# Patient Record
Sex: Female | Born: 1935
Health system: Southern US, Community
[De-identification: ages and names within clinical notes are randomized; demographics above are authoritative.]

## PROBLEM LIST (undated history)

## (undated) ENCOUNTER — Emergency Department (HOSPITAL_COMMUNITY): Admission: EM | Payer: Medicare Other | Source: Home / Self Care

## (undated) DIAGNOSIS — F17200 Nicotine dependence, unspecified, uncomplicated: Secondary | ICD-10-CM

## (undated) DIAGNOSIS — E785 Hyperlipidemia, unspecified: Secondary | ICD-10-CM

## (undated) DIAGNOSIS — I219 Acute myocardial infarction, unspecified: Secondary | ICD-10-CM

## (undated) DIAGNOSIS — I4819 Other persistent atrial fibrillation: Secondary | ICD-10-CM

## (undated) DIAGNOSIS — D649 Anemia, unspecified: Secondary | ICD-10-CM

## (undated) DIAGNOSIS — I2510 Atherosclerotic heart disease of native coronary artery without angina pectoris: Secondary | ICD-10-CM

## (undated) DIAGNOSIS — H409 Unspecified glaucoma: Secondary | ICD-10-CM

## (undated) DIAGNOSIS — I10 Essential (primary) hypertension: Secondary | ICD-10-CM

## (undated) DIAGNOSIS — J309 Allergic rhinitis, unspecified: Secondary | ICD-10-CM

## (undated) DIAGNOSIS — J449 Chronic obstructive pulmonary disease, unspecified: Secondary | ICD-10-CM

## (undated) DIAGNOSIS — K262 Acute duodenal ulcer with both hemorrhage and perforation: Secondary | ICD-10-CM

## (undated) DIAGNOSIS — I509 Heart failure, unspecified: Secondary | ICD-10-CM

## (undated) DIAGNOSIS — J4489 Other specified chronic obstructive pulmonary disease: Secondary | ICD-10-CM

## (undated) HISTORY — DX: Nicotine dependence, unspecified, uncomplicated: F17.200

## (undated) HISTORY — DX: Allergic rhinitis, unspecified: J30.9

## (undated) HISTORY — PX: OTHER SURGICAL HISTORY: SHX169

## (undated) HISTORY — DX: Acute myocardial infarction, unspecified: I21.9

## (undated) HISTORY — DX: Essential (primary) hypertension: I10

## (undated) HISTORY — DX: Other specified chronic obstructive pulmonary disease: J44.89

## (undated) HISTORY — DX: Anemia, unspecified: D64.9

## (undated) HISTORY — DX: Hyperlipidemia, unspecified: E78.5

## (undated) HISTORY — DX: Atherosclerotic heart disease of native coronary artery without angina pectoris: I25.10

## (undated) HISTORY — DX: Heart failure, unspecified: I50.9

## (undated) HISTORY — DX: Other persistent atrial fibrillation: I48.19

## (undated) HISTORY — DX: Chronic obstructive pulmonary disease, unspecified: J44.9

## (undated) HISTORY — DX: Unspecified glaucoma: H40.9

## (undated) HISTORY — DX: Acute duodenal ulcer with both hemorrhage and perforation: K26.2

---

## 1988-12-09 HISTORY — PX: HEMORRHOID SURGERY: SHX153

## 2009-06-08 DIAGNOSIS — I2510 Atherosclerotic heart disease of native coronary artery without angina pectoris: Secondary | ICD-10-CM

## 2009-06-08 DIAGNOSIS — I219 Acute myocardial infarction, unspecified: Secondary | ICD-10-CM

## 2009-06-08 HISTORY — DX: Atherosclerotic heart disease of native coronary artery without angina pectoris: I25.10

## 2009-06-08 HISTORY — DX: Acute myocardial infarction, unspecified: I21.9

## 2009-07-07 ENCOUNTER — Ambulatory Visit: Payer: Self-pay | Admitting: Cardiovascular Disease

## 2009-07-07 ENCOUNTER — Inpatient Hospital Stay (HOSPITAL_COMMUNITY): Admission: EM | Admit: 2009-07-07 | Discharge: 2009-07-11 | Payer: Self-pay | Admitting: Emergency Medicine

## 2009-07-10 ENCOUNTER — Ambulatory Visit: Payer: Self-pay | Admitting: Surgery

## 2009-07-10 ENCOUNTER — Encounter: Payer: Self-pay | Admitting: Cardiology

## 2009-07-12 DIAGNOSIS — E785 Hyperlipidemia, unspecified: Secondary | ICD-10-CM | POA: Insufficient documentation

## 2009-07-12 DIAGNOSIS — I219 Acute myocardial infarction, unspecified: Secondary | ICD-10-CM | POA: Insufficient documentation

## 2009-07-12 DIAGNOSIS — F17200 Nicotine dependence, unspecified, uncomplicated: Secondary | ICD-10-CM

## 2009-07-12 DIAGNOSIS — E119 Type 2 diabetes mellitus without complications: Secondary | ICD-10-CM | POA: Insufficient documentation

## 2009-07-19 ENCOUNTER — Ambulatory Visit: Payer: Self-pay | Admitting: Cardiology

## 2009-07-19 ENCOUNTER — Encounter: Payer: Self-pay | Admitting: Physician Assistant

## 2009-07-19 ENCOUNTER — Encounter: Payer: Self-pay | Admitting: Cardiology

## 2009-07-19 ENCOUNTER — Ambulatory Visit: Payer: Self-pay

## 2009-07-19 DIAGNOSIS — R1909 Other intra-abdominal and pelvic swelling, mass and lump: Secondary | ICD-10-CM

## 2009-07-27 ENCOUNTER — Encounter: Payer: Self-pay | Admitting: Cardiology

## 2009-08-08 ENCOUNTER — Ambulatory Visit: Payer: Self-pay | Admitting: Cardiology

## 2009-08-11 ENCOUNTER — Encounter: Payer: Self-pay | Admitting: Cardiology

## 2009-08-16 ENCOUNTER — Encounter: Payer: Self-pay | Admitting: Cardiology

## 2009-08-16 ENCOUNTER — Ambulatory Visit: Payer: Self-pay

## 2009-10-03 ENCOUNTER — Ambulatory Visit: Payer: Self-pay | Admitting: Cardiology

## 2009-10-03 DIAGNOSIS — I2510 Atherosclerotic heart disease of native coronary artery without angina pectoris: Secondary | ICD-10-CM | POA: Insufficient documentation

## 2009-10-04 LAB — CONVERTED CEMR LAB
Albumin: 3.8 g/dL (ref 3.5–5.2)
Basophils Absolute: 0.1 10*3/uL (ref 0.0–0.1)
Bilirubin, Direct: 0 mg/dL (ref 0.0–0.3)
CO2: 29 meq/L (ref 19–32)
Calcium: 8.6 mg/dL (ref 8.4–10.5)
Creatinine, Ser: 0.7 mg/dL (ref 0.4–1.2)
HCT: 47.3 % — ABNORMAL HIGH (ref 36.0–46.0)
HDL: 47.5 mg/dL (ref >39.00)
Hemoglobin: 16.4 g/dL — ABNORMAL HIGH (ref 12.0–15.0)
Lymphocytes Relative: 18.9 % (ref 12.0–46.0)
MCHC: 34.7 g/dL (ref 30.0–36.0)
MCV: 95.2 fL (ref 78.0–100.0)
Neutrophils Relative %: 71 % (ref 43.0–77.0)
Potassium: 4 meq/L (ref 3.5–5.1)
RBC: 4.97 M/uL (ref 3.87–5.11)
RDW: 12.8 % (ref 11.5–14.6)
Sodium: 138 meq/L (ref 135–145)
Total CHOL/HDL Ratio: 4

## 2009-10-05 ENCOUNTER — Inpatient Hospital Stay (HOSPITAL_COMMUNITY): Admission: EM | Admit: 2009-10-05 | Discharge: 2009-10-09 | Payer: Self-pay | Admitting: Emergency Medicine

## 2009-10-05 ENCOUNTER — Encounter (INDEPENDENT_AMBULATORY_CARE_PROVIDER_SITE_OTHER): Payer: Self-pay | Admitting: *Deleted

## 2009-10-05 ENCOUNTER — Ambulatory Visit: Payer: Self-pay | Admitting: Cardiology

## 2009-10-05 DIAGNOSIS — K262 Acute duodenal ulcer with both hemorrhage and perforation: Secondary | ICD-10-CM

## 2009-10-05 HISTORY — DX: Acute duodenal ulcer with both hemorrhage and perforation: K26.2

## 2009-10-06 ENCOUNTER — Encounter: Payer: Self-pay | Admitting: Gastroenterology

## 2009-10-09 LAB — CONVERTED CEMR LAB
AST: 20 units/L
BUN: 7 mg/dL
CO2: 29 meq/L
Chloride: 109 meq/L
Hemoglobin: 8.1 g/dL
Platelets: 205 10*3/uL
Potassium: 3.5 meq/L
Sodium: 142 meq/L
Total Bilirubin: 0.4 mg/dL
Total Protein: 5 g/dL

## 2009-10-11 ENCOUNTER — Ambulatory Visit: Payer: Self-pay | Admitting: Internal Medicine

## 2009-10-11 DIAGNOSIS — D649 Anemia, unspecified: Secondary | ICD-10-CM

## 2009-10-11 DIAGNOSIS — H409 Unspecified glaucoma: Secondary | ICD-10-CM | POA: Insufficient documentation

## 2009-10-11 LAB — CONVERTED CEMR LAB
Basophils Relative: 0.9 % (ref 0.0–3.0)
Eosinophils Relative: 2.4 % (ref 0.0–5.0)
HCT: 28.1 % — ABNORMAL LOW (ref 36.0–46.0)
Hemoglobin: 9.6 g/dL — ABNORMAL LOW (ref 12.0–15.0)
MCHC: 34.1 g/dL (ref 30.0–36.0)
MCV: 97.3 fL (ref 78.0–100.0)
Monocytes Absolute: 0.6 10*3/uL (ref 0.1–1.0)
Neutro Abs: 4.7 10*3/uL (ref 1.4–7.7)
RBC: 2.89 M/uL — ABNORMAL LOW (ref 3.87–5.11)
WBC: 7.6 10*3/uL (ref 4.5–10.5)

## 2009-11-09 ENCOUNTER — Telehealth: Payer: Self-pay | Admitting: Internal Medicine

## 2010-01-11 ENCOUNTER — Encounter: Payer: Self-pay | Admitting: Cardiology

## 2010-01-12 ENCOUNTER — Encounter (INDEPENDENT_AMBULATORY_CARE_PROVIDER_SITE_OTHER): Payer: Self-pay | Admitting: *Deleted

## 2010-01-12 ENCOUNTER — Ambulatory Visit: Payer: Self-pay | Admitting: Internal Medicine

## 2010-01-12 ENCOUNTER — Telehealth (INDEPENDENT_AMBULATORY_CARE_PROVIDER_SITE_OTHER): Payer: Self-pay | Admitting: *Deleted

## 2010-01-12 LAB — CONVERTED CEMR LAB
Basophils Absolute: 0.1 10*3/uL (ref 0.0–0.1)
Basophils Relative: 1.4 % (ref 0.0–3.0)
Eosinophils Relative: 2.2 % (ref 0.0–5.0)
HCT: 35.8 % — ABNORMAL LOW (ref 36.0–46.0)
Hemoglobin: 11.5 g/dL — ABNORMAL LOW (ref 12.0–15.0)
Lymphs Abs: 1.3 10*3/uL (ref 0.7–4.0)
MCV: 77.6 fL — ABNORMAL LOW (ref 78.0–100.0)
Monocytes Absolute: 0.4 10*3/uL (ref 0.1–1.0)

## 2010-02-05 ENCOUNTER — Ambulatory Visit: Payer: Self-pay | Admitting: Gastroenterology

## 2010-02-05 DIAGNOSIS — R1013 Epigastric pain: Secondary | ICD-10-CM | POA: Insufficient documentation

## 2010-02-05 DIAGNOSIS — R1310 Dysphagia, unspecified: Secondary | ICD-10-CM | POA: Insufficient documentation

## 2010-02-07 ENCOUNTER — Telehealth: Payer: Self-pay | Admitting: Cardiology

## 2010-02-08 ENCOUNTER — Telehealth: Payer: Self-pay | Admitting: Gastroenterology

## 2010-03-09 ENCOUNTER — Ambulatory Visit: Payer: Self-pay | Admitting: Cardiology

## 2010-03-09 DIAGNOSIS — R609 Edema, unspecified: Secondary | ICD-10-CM

## 2010-03-09 LAB — CONVERTED CEMR LAB
Basophils Absolute: 0.1 10*3/uL (ref 0.0–0.1)
Eosinophils Relative: 2 % (ref 0–5)
Lymphs Abs: 1.4 10*3/uL (ref 0.7–4.0)
MCV: 77.6 fL — ABNORMAL LOW (ref 78.0–100.0)
Monocytes Absolute: 0.6 10*3/uL (ref 0.1–1.0)
Monocytes Relative: 9 % (ref 3–12)
Neutro Abs: 4.4 10*3/uL (ref 1.7–7.7)
Neutrophils Relative %: 67 % (ref 43–77)
Platelets: 367 10*3/uL (ref 150–400)
RDW: 18.4 % — ABNORMAL HIGH (ref 11.5–15.5)
WBC: 6.6 10*3/uL (ref 4.0–10.5)

## 2010-03-26 ENCOUNTER — Telehealth (INDEPENDENT_AMBULATORY_CARE_PROVIDER_SITE_OTHER): Payer: Self-pay | Admitting: *Deleted

## 2010-03-27 ENCOUNTER — Ambulatory Visit: Payer: Self-pay

## 2010-03-27 ENCOUNTER — Encounter (HOSPITAL_COMMUNITY): Admission: RE | Admit: 2010-03-27 | Discharge: 2010-06-08 | Payer: Self-pay | Admitting: Cardiology

## 2010-03-27 ENCOUNTER — Ambulatory Visit: Payer: Self-pay | Admitting: Cardiovascular Disease

## 2010-03-27 ENCOUNTER — Encounter: Payer: Self-pay | Admitting: Cardiology

## 2010-04-03 ENCOUNTER — Ambulatory Visit: Payer: Self-pay | Admitting: Internal Medicine

## 2010-04-04 ENCOUNTER — Encounter: Payer: Self-pay | Admitting: Internal Medicine

## 2010-04-06 ENCOUNTER — Ambulatory Visit: Payer: Self-pay | Admitting: Cardiology

## 2010-04-06 DIAGNOSIS — I10 Essential (primary) hypertension: Secondary | ICD-10-CM | POA: Insufficient documentation

## 2010-05-21 ENCOUNTER — Ambulatory Visit: Payer: Self-pay | Admitting: Internal Medicine

## 2010-05-21 DIAGNOSIS — R635 Abnormal weight gain: Secondary | ICD-10-CM | POA: Insufficient documentation

## 2010-05-21 DIAGNOSIS — J449 Chronic obstructive pulmonary disease, unspecified: Secondary | ICD-10-CM | POA: Insufficient documentation

## 2010-05-21 LAB — CONVERTED CEMR LAB
CO2: 27 meq/L (ref 19–32)
Chloride: 105 meq/L (ref 96–112)
Creatinine, Ser: 0.7 mg/dL (ref 0.4–1.2)
GFR calc non Af Amer: 92.87 mL/min (ref >60)
Potassium: 4.5 meq/L (ref 3.5–5.1)
Sodium: 142 meq/L (ref 135–145)

## 2010-07-24 ENCOUNTER — Encounter: Admission: RE | Admit: 2010-07-24 | Discharge: 2010-09-06 | Payer: Self-pay | Admitting: Internal Medicine

## 2010-07-24 ENCOUNTER — Encounter: Payer: Self-pay | Admitting: Internal Medicine

## 2010-07-25 ENCOUNTER — Telehealth: Payer: Self-pay | Admitting: Internal Medicine

## 2010-09-13 ENCOUNTER — Ambulatory Visit: Payer: Self-pay | Admitting: Cardiology

## 2010-09-19 LAB — CONVERTED CEMR LAB
AST: 21 units/L (ref 0–37)
Alkaline Phosphatase: 88 units/L (ref 39–117)
Basophils Relative: 1.3 % (ref 0.0–3.0)
Bilirubin, Direct: 0 mg/dL (ref 0.0–0.3)
Cholesterol: 259 mg/dL — ABNORMAL HIGH (ref 0–200)
HCT: 38.7 % (ref 36.0–46.0)
HDL: 47.8 mg/dL (ref >39.00)
Hemoglobin: 13 g/dL (ref 12.0–15.0)
Lymphocytes Relative: 18.5 % (ref 12.0–46.0)
MCV: 80.9 fL (ref 78.0–100.0)
Neutrophils Relative %: 70.9 % (ref 43.0–77.0)
Platelets: 273 10*3/uL (ref 150.0–400.0)
Total Bilirubin: 0.3 mg/dL (ref 0.3–1.2)

## 2010-10-16 ENCOUNTER — Ambulatory Visit: Payer: Self-pay | Admitting: Internal Medicine

## 2010-10-16 DIAGNOSIS — J309 Allergic rhinitis, unspecified: Secondary | ICD-10-CM | POA: Insufficient documentation

## 2011-01-08 NOTE — Miscellaneous (Signed)
Summary: [facility]  [facility]   Imported By: Roderic Ovens 01/23/2010 15:18:34  _____________________________________________________________________  External Attachment:    Type:   Image     Comment:   External Document

## 2011-01-08 NOTE — Assessment & Plan Note (Signed)
Summary: Cardiology Nuclear Study  Nuclear Med Background Indications for Stress Test: Evaluation for Ischemia, Stent Patency   History: Asthma, COPD, Heart Catheterization, Myocardial Infarction, Myocardial Perfusion Study, Stents  History Comments: 7/10 AWMI>Stent-RCA  Symptoms: Chest Pain, Diaphoresis, DOE, Fatigue, Rapid HR  Symptoms Comments: Last episode of CP:December 2010   Nuclear Pre-Procedure Cardiac Risk Factors: Hypertension, Lipids, Obesity, Smoker Caffeine/Decaff Intake: none NPO After: 8:00 PM Lungs: Inspiratory wheeze right lower lung; albuterol inhaler used prior to lexiscan.  O2 Sat 97% on RA. IV 0.9% NS with Angio Cath: 20g     IV Site: (R) wrist IV Started by: Stanton Kidney EMT-P Chest Size (in) 42     Cup Size D     Height (in): 62 Weight (lb): 200 BMI: 36.71 Tech Comments: metoprolol held x 24 hours, per patient.  Nuclear Med Study 1 or 2 day study:  1 day     Stress Test Type:  Eugenie Birks Reading MD:  Charlton Haws, MD     Referring MD:  Charlies Constable, MD Resting Radionuclide:  Technetium 47m Tetrofosmin     Resting Radionuclide Dose:  11 mCi  Stress Radionuclide:  Technetium 59m Tetrofosmin     Stress Radionuclide Dose:  33 mCi   Stress Protocol   Lexiscan: 0.4 mg   Stress Test Technologist:  Rea College CMA-N     Nuclear Technologist:  Domenic Polite CNMT  Rest Procedure  Myocardial perfusion imaging was performed at rest 45 minutes following the intravenous administration of Myoview Technetium 64m Tetrofosmin.  Stress Procedure  The patient received IV Lexiscan 0.4 mg over 15-seconds.  Myoview injected at 30-seconds.  There were no significant changes with lexiscan, rare PVC.  Quantitative spect images were obtained after a 45 minute delay.  QPS Raw Data Images:  Normal; no motion artifact; normal heart/lung ratio. Stress Images:  NI: Uniform and normal uptake of tracer in all myocardial segments. Rest Images:  Normal homogeneous uptake in all  areas of the myocardium. Subtraction (SDS):  Normal Transient Ischemic Dilatation:  1.11  (Normal <1.22)  Lung/Heart Ratio:  .34  (Normal <0.45)  Quantitative Gated Spect Images QGS EDV:  62 ml QGS ESV:  14 ml QGS EF:  78 % QGS cine images:  normal  Findings Normal nuclear study      Overall Impression  Exercise Capacity: Lexiscan BP Response: Normal blood pressure response. Clinical Symptoms: Dyspnea and Nausea ECG Impression: No significant ST segment change suggestive of ischemia. Overall Impression: Normal stress nuclear study. Overall Impression Comments: Breast attenuation  Appended Document: Cardiology Nuclear Study The pt has an appointment 4/29 with Dr. Juanda Chance.  Appended Document: Cardiology Nuclear Study The pt was made aware of her results at her office visit today.

## 2011-01-08 NOTE — Letter (Signed)
Summary: New Patient letter  [facility]  [address] [place], [place] [address]   Phone: [phone]  Fax: [phone]       01/12/2010 MRN: [id number]  Jenny [name] [address] [place], [place]  [address]  [place]  Dear Ms. Yetta Barre,  Welcome to the Gastroenterology Division at [facility].    You are scheduled to see Dr.  Arlyce Dice on 02-05-10 at 2pm on the 3rd floor at [facility], [address]  We ask that you try to arrive at our office 15 minutes prior to your appointment time to allow for check-in.  We would like you to complete the enclosed self-administered evaluation form prior to your visit and bring it with you on the day of your appointment.  We will review it with you.  Also, please bring a complete list of all your medications or, if you prefer, bring the medication bottles and we will list them.  Please bring your insurance card so that we may make a copy of it.  If your insurance requires a referral to see a specialist, please bring your referral form from your primary care physician.  Co-payments are due at the time of your visit and may be paid by cash, check or credit card.     Your office visit will consist of a consult with your physician (includes a physical exam), any laboratory testing he/she may order, scheduling of any necessary diagnostic testing (e.g. x-ray, ultrasound, CT-scan), and scheduling of a procedure (e.g. Endoscopy, Colonoscopy) if required.  Please allow enough time on your schedule to allow for any/all of these possibilities.    If you cannot keep your appointment, please call [phone] to cancel or reschedule prior to your appointment date.  This allows [place] the opportunity to schedule an appointment for another patient in need of care.  If you do not cancel or reschedule by 5 p.m. the business day prior to your appointment date, you will be charged a $50.00 late cancellation/no-show fee.    Thank you for choosing [facility] for your medical needs.  We appreciate the opportunity to care for you.  Please visit [place] at our website  to learn more about our practice.                     Sincerely,                                                             The Gastroenterology Division

## 2011-01-08 NOTE — Progress Notes (Signed)
Summary: Speak to Robin  Phone Note Call from Patient Call back at Home Phone [phone] Call back at OR CELL [phone]   Call For: DR [facility] Reason for Call: Talk to Nurse Summary of Call: Returned pts call she stated Dr Juanda Chance does not want her off the Plavix until after July. She will call back and schedule at that time. Pt needs EGD with Dilation and wants a colonoscopy done at the same time but will call back to schedule Initial call taken by: Leanor Kail [facility],  February 08, 2010 11:49 AM  Follow-up for Phone Call        Dr Leota Jacobsen  Additional Follow-up for Phone Call Additional follow up Details #1::        ok Additional Follow-up by: Louis Meckel MD,  February 09, 2010 10:14 AM

## 2011-01-08 NOTE — Assessment & Plan Note (Signed)
Summary: 1 MONTH ROV   Visit Type:  Follow-up Primary Provider:  Newt Lukes MD  CC:  sob .  History of Present Illness: the patient is 77 years old and return for management of CAD. She had an anterior MI in July of 2010 treated with a drug-eluting stent to the right coronary. This was complicated by a false aneurysm. In October she was admitted with a GI bleed and was found to have an active duodenal ulcer on endoscopy.  She recovered from all this but recently she says she has had increasing difficulty with shortness of breath with exertion. She does walk across the room and she gets short of breath. She does have known COPD and is a smoker. She's cut back from the carton a week to a carton every 3-4 weeks.  Her other problems include hypertension, hyperlipidemia, and diabetes.  Per recent PFT's she has moderate COPD which is reversible to bronchodilators. she was also evaluated for her SOB with a myoview which was negative.   Patient currently denies SOB, Denies CP, Denies fever, chills, nausea, vomiting, diarrhea, constipation and otherwise doing well and denies any other complaints.     Current Medications (verified): 1)  Plavix 75 Mg Tabs (Clopidogrel Bisulfate) .... Take One Daily 2)  Simvastatin 40 Mg Tabs (Simvastatin) .... Take One Daily 3)  Metoprolol Succinate 50 Mg Xr24h-Tab (Metoprolol Succinate) .... Take One Daily 4)  Nitroglycerin 0.4 Mg Subl (Nitroglycerin) .... Take As Needed 5)  Potassium 500 Mg .... Take As Needed 6)  Lutein 20 Mg Caps (Lutein) .... One Tab Once Daily 7)  Feosol 200 (65 Fe) Mg Tabs (Ferrous Sulfate Dried) .... One Tab As Needed 8)  Bilberry 500 Mg Caps (Bilberry (Vaccinium Myrtillus)) .... Patient Takes 1000mg  Daily 9)  Chromium Picolinate Ultra 500 Mcg Tabs (Chromium Picolinate) .... One Tab Q2weeks 10)  Tylenol Extra Strength 500 Mg Tabs (Acetaminophen) .... As Needed 11)  Xalatan 0.005 % Soln (Latanoprost) .... One Gtt Both Eyes Hs 12)   Omeprazole 20 Mg Cpdr (Omeprazole) .... Take 1 Two Times A Day 13)  Milk of Magnesia 7.75 % Susp (Magnesium Hydroxide) .... Take Once Daily 14)  Vitamin B-6 100 Mg Tabs (Pyridoxine Hcl) .... Take 4 To 5 Times Weekly 15)  Zyrtec Hives Relief 10 Mg Tabs (Cetirizine Hcl) .... For Allergies 1 Tab Once Daily 16)  Advair Diskus 250-50 Mcg/dose Aepb (Fluticasone-Salmeterol) .... One Puff Two Times A Day 17)  Ventolin Hfa 108 (90 Base) Mcg/act Aers (Albuterol Sulfate) .... Use One Puff Every 4-6 Hours As Needed For Shortness of Breath.  Allergies (verified): 1)  ! Demerol 2)  ! 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 [facility] - prev hung, now to be LeB Glaucoma Upper GI Bleed 10/2009 Anal Fissure Arthritis Hypertension Obesity Pneumonia  Review of Systems       Negative except per HPI  Vital Signs:  Patient profile:   77 year old female Height:      62 inches Weight:      204 pounds Pulse rate:   77 / minute BP sitting:   160 / 75  (left arm) Cuff size:   large  Vitals Entered By: Burnett Kanaris, CNA (April 06, 2010 3:56 PM)  Physical Exam  General:  alert,  well-developed, well-nourished, and cooperative to examination.    Lungs:  decreased BS bilateral.  prolonged expiratory phase, no wheezing. decreased BS bilateral.   Heart:  normal rate, regular rhythm, no murmur, and no rub.  Abdomen:  soft, non-tender, normal bowel sounds, no distention; no masses and no appreciable hepatomegaly or splenomegaly.   Msk:  No deformity or scoliosis noted of thoracic or lumbar spine.   Extremities:  No cyanosis, clubbing, edema  Neurologic:  alert & oriented X3 and cranial nerves II-XII symetrically intact.  strength normal in all extremities, sensation intact to light touch, and  gait normal. speech fluent without dysarthria or aphasia; follows commands with good comprehension.    Impression & Recommendations:  Problem # 1:  SHORTNESS OF BREATH (ICD-786.05) Assessment Comment Only She has shortness of breath with minimal exertion which has become somewhat disabling. She has no associated chest pain. Since she had a negative myoview and moderate COPD reversible with albuterol, will start patient with Advair and albuterol for her COPD. Will see her again in 6 months.   Her updated medication list for this problem includes:    Metoprolol Succinate 50 Mg Xr24h-tab (Metoprolol succinate) .Marland Kitchen... Take one daily  Problem # 2:  CAD, NATIVE VESSEL (ICD-414.01) Assessment: Comment Only Well controlled on current treatment, No new changes made today, Will continue to monitor.   Her updated medication list for this problem includes:    Plavix 75 Mg Tabs (Clopidogrel bisulfate) .Marland Kitchen... Take one daily    Metoprolol Succinate 50 Mg Xr24h-tab (Metoprolol succinate) .Marland Kitchen... Take one daily    Nitroglycerin 0.4 Mg Subl (Nitroglycerin) .Marland Kitchen... Take as needed  Her updated medication list for this problem includes:    Plavix 75 Mg Tabs (Clopidogrel bisulfate) .Marland Kitchen... Take one daily    Metoprolol Succinate 50 Mg Xr24h-tab (Metoprolol succinate) .Marland Kitchen... Take one daily    Nitroglycerin 0.4 Mg Subl (Nitroglycerin) .Marland Kitchen... Take as needed  Problem # 3:  HYPERLIPIDEMIA (ICD-272.4) Assessment: Comment Only Well controlled on current treatment, No new changes made today, Will continue to monitor.   Her updated medication list for this problem includes:    Simvastatin 40 Mg Tabs (Simvastatin) .Marland Kitchen... Take one daily  CHOL: 198 (10/03/2009)   HDL: 47.50 (10/03/2009)   TG: 272.0 (10/03/2009)  Problem # 4:  TOBACCO ABUSE (ICD-305.1) Assessment: Comment Only Patient was counseled on smoking cessation strategies including medications and behavior modification options. Patient said she was not ready to stop  smoking at this time.  Time spent talking to patient about smoking 10 minutes.   Problem # 5:  HYPERTENSION, BENIGN (ICD-401.1) Assessment: Comment Only BP slightly elevated today, however this is 2/2 to patient not taking her BB today, will recheck on next visit, for now no new med changes made.   Her updated medication list for this problem includes:    Metoprolol Succinate 50 Mg Xr24h-tab (Metoprolol succinate) .Marland Kitchen... Take one daily  BP today: 160/75 Prior BP: 142/76 (03/09/2010)  Labs Reviewed: K+: 3.5 (10/09/2009) Creat: : 0.75 (10/09/2009)   Chol: 198 (10/03/2009)   HDL: 47.50 (10/03/2009)   TG: 272.0 (10/03/2009)  Patient Instructions: 1)  Your physician has recommended you make the following change in your medication: 1) Start Advair 250/34mcg one puff two times a day , 2) Start Albuterol (ventolin) one puff every 4-6 hours as needed for shortness of breath. 2)  Your physician wants you to follow-up in:  6 months. You will receive a reminder letter in the mail two months in advance. If  you don't receive a letter, please call our office to schedule the follow-up appointment. Prescriptions: VENTOLIN HFA 108 (90 BASE) MCG/ACT AERS (ALBUTEROL SULFATE) use one puff every 4-6 hours as needed for shortness of breath.  #1 x 3   Entered by:   Sherri Rad, RN, BSN   Authorized by:   Lenoria Farrier, MD, [facility]   Signed by:   Sherri Rad, RN, BSN on 04/06/2010   Method used:   Electronically to        [facility] (retail)       [address]       [place], [place]  [id number]       Ph: [phone]       Fax: [phone]   RxID:   [id number] ADVAIR DISKUS 250-50 MCG/DOSE AEPB (FLUTICASONE-SALMETEROL) one puff two times a day  #1 x 6   Entered by:   Sherri Rad, RN, BSN   Authorized by:   Lenoria Farrier, MD, [facility]   Signed by:   Sherri Rad, RN, BSN on 04/06/2010   Method used:   Electronically to        [facility] (retail)       [address]       [place], [place]  [id number]       Ph: [phone]       Fax: [phone]   RxID:   [id number]

## 2011-01-08 NOTE — Assessment & Plan Note (Signed)
Summary: DU WITH GIB/YF   History of Present Illness Visit Type: Initial Consult Primary GI MD: Melvia Heaps MD [facility] Primary Provider: Newt Lukes MD Requesting Provider: Rene Paci, MD Chief Complaint: abdominal pain History of Present Illness:   Mr. Icard is a 77 year old white female referred at the request of Dr.Leschber for evaluation of a abdominall pain.  In October, 2010 she was hospitalized with an acute bleeding duodenal ulcer.  This was treated endoscopically by clipping.  At the time she was on full dose aspirin.  She remains on omeprazole 20 mg b.i.d.  For several weeks she has been complaining of upper midepigastric discomfort, especially with eating.  She may have some dysphagia to solidsalong with  nausea and complains of belching, bloating and some acid reflux.  Symptoms are mostly postprandial.  The patient has a history of an MI.  She also has glaucoma.  Medical problems are stable.   GI Review of Systems    Reports abdominal pain, acid reflux, belching, bloating, chest pain, nausea, and  vomiting.     Location of  Abdominal pain: upper abdomen.    Denies dysphagia with liquids, dysphagia with solids, heartburn, loss of appetite, vomiting blood, weight loss, and  weight gain.      Reports constipation, hemorrhoids, and  rectal bleeding.     Denies anal fissure, black tarry stools, change in bowel habit, diarrhea, diverticulosis, fecal incontinence, heme positive stool, irritable bowel syndrome, jaundice, light color stool, liver problems, and  rectal pain.    Current Medications (verified): 1)  Plavix 75 Mg Tabs (Clopidogrel Bisulfate) .... Take One Daily 2)  Simvastatin 40 Mg Tabs (Simvastatin) .... Take One Daily 3)  Metoprolol Succinate 50 Mg Xr24h-Tab (Metoprolol Succinate) .... Take One Daily 4)  Nitroglycerin 0.4 Mg Subl (Nitroglycerin) .... Take As Needed 5)  Zyrtec Hives Relief 10 Mg Tabs (Cetirizine Hcl) .... Take One Daily 6)  Potassium 500 Mg  .... Take As Needed 7)  Lutein 20 Mg Caps (Lutein) .... One Tab Once Daily 8)  Feosol 200 (65 Fe) Mg Tabs (Ferrous Sulfate Dried) .... One Tab As Needed 9)  Glucosamine-Chondroitin 500-400 Mg Caps (Glucosamine-Chondroitin) .... Daily 10)  Bilberry 500 Mg Caps (Bilberry (Vaccinium Myrtillus)) .... Patient Takes 1000mg  Daily 11)  Chromium Picolinate Ultra 500 Mcg Tabs (Chromium Picolinate) .... One Tab Q2weeks 12)  Oil of Oregano 1500 Mg Caps (Oregano) .... One Tab Two Times A Day 13)  Tylenol Extra Strength 500 Mg Tabs (Acetaminophen) .... As Needed 14)  Yaest Fungal Detox .Marland Kitchen.. 5 Tabs Daily 15)  Xalatan 0.005 % Soln (Latanoprost) .... One Gtt Both Eyes Hs 16)  Pepcid Complete 10-800-165 Mg Chew (Famotidine-Ca Carb-Mag Hydrox) .... As Needed 17)  Maalox Regular Strength 225-200-25 Mg/65ml Susp (Alum & Mag Hydroxide-Simeth) .... As Needed 18)  Omeprazole 20 Mg Cpdr (Omeprazole) .... Take 1 Two Times A Day 19)  Milk of Magnesia 7.75 % Susp (Magnesium Hydroxide) .... Take Once Daily 20)  Vitamin B-6 100 Mg Tabs (Pyridoxine Hcl) .... Take 2 By Mouth Qd  Allergies: 1)  ! Demerol 2)  ! 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 [facility] - prev hung, now to be LeB Glaucoma Upper GI Bleed 10/2009 Anal Fissure Arthritis Hypertension Obesity Pneumonia  Family History: Reviewed history from 07/12/2009 and no  changes required.  Mother is deceased.  She had a history of stomach   ulcers.  Father is deceased of stomach cancer.  The patient has one half brother, whose health is unknown.   Social History: She lives in [place] with her significant other (charles hook) She is retired from a [employer].   She has 5 children 60-pack-year smoking history; presently smoking a half pack a day rarely has  alcohol.  enjoys local travel, live theater/shows Occupation: Retired Conservation officer, nature Daily Caffeine Use-4  Review of Systems       The patient complains of allergy/sinus, arthritis/joint pain, change in vision, cough, fatigue, muscle pains/cramps, shortness of breath, and swelling of feet/legs.  The patient denies anemia, anxiety-new, back pain, blood in urine, breast changes/lumps, confusion, coughing up blood, depression-new, fainting, fever, headaches-new, hearing problems, heart murmur, heart rhythm changes, itching, menstrual pain, night sweats, nosebleeds, pregnancy symptoms, skin rash, sleeping problems, sore throat, swollen lymph glands, thirst - excessive , urination - excessive , urination changes/pain, urine leakage, vision changes, and voice change.         All other systems were reviewed and were negative   Vital Signs:  Patient profile:   77 year old female Height:      62 inches Weight:      201.25 pounds BMI:     36.94 Pulse rate:   84 / minute Pulse rhythm:   regular BP sitting:   138 / 70  (left arm) Cuff size:   regular  Vitals Entered By: June McMurray CMA Duncan Dull) (February 05, 2010 2:04 PM)  Physical Exam  Additional Exam:  She is a well-developed well-nourished female  skin: anicteric HEENT: normocephalic; PEERLA; no nasal or pharyngeal abnormalities neck: supple nodes: no cervical lymphadenopathy chest: clear to ausculatation and percussion heart: no murmurs, gallops, or rubs abd: soft, nontender; BS normoactive; no abdominal masses, tenderness, organomegaly rectal: deferred ext: no cynanosis, clubbing, edema skeletal: no deformities neuro: oriented x 3; no focal abnormalities    Impression & Recommendations:  Problem # 1:  ABDOMINAL PAIN, EPIGASTRIC (ICD-789.06) Pain could be due to ulcer or nonulcer dyspepsia.  She may also have an early esophageal stricture which is causing her pain as well as dysphagia.  Recommendations #1 upper endoscopy  Problem  # 2:  DYSPHAGIA UNSPECIFIED (ICD-787.20) She may have an early esophageal stricture.  I have recommended endoscopy with possible dilatation though the patient wishes to defer dilatation at this time.  She will let me know at her endoscopy whether she has changed her mind.  Problem # 3:  GLAUCOMA (ICD-365.9) Assessment: Comment Only  Problem # 4:  MYOCARDIAL INFARCTION (ICD-410.90) Assessment: Comment Only  Patient Instructions: 1)  You need to contact Robin at your convenience when you decide to schedule your EGD with or without dilitation. 2)  Also contact Dr Charlies Constable with any issues you may have about your Plavix 3)  The medication list was reviewed and reconciled.  All changed / newly prescribed medications were explained.  A complete medication list was provided to the patient / caregiver. 4)  Conscious Sedation brochure given.  5)  Upper Endoscopy brochure given.

## 2011-01-08 NOTE — Progress Notes (Signed)
Summary: Nuclear pre procedure  Phone Note Outgoing Call Call back at Home Phone [phone]   Call placed by: Rea College, CMA,  March 26, 2010 12:22 PM Call placed to: Patient Summary of Call: Reviewed information on Myoview Information Sheet (see scanned document for further details).  Spoke with patient.      Nuclear Med Background Indications for Stress Test: Evaluation for Ischemia, Stent Patency   History: COPD, Heart Catheterization, Myocardial Infarction, Myocardial Perfusion Study, Stents  History Comments: 7/10 AWMI>Stent-RCA  Symptoms: DOE    Nuclear Pre-Procedure Cardiac Risk Factors: Hypertension, Lipids, Smoker Height (in): 62

## 2011-01-08 NOTE — Progress Notes (Signed)
Summary: Jenny [name] about t a procedure  Phone Note Call from Patient Call back at [facility] Phone [phone] Call back at [phone]   Caller: Patient Summary of Call: Pt have question about a procedure. Initial call taken by: Judie Grieve,  February 07, 2010 12:18 PM  Follow-up for Phone Call        The pt has seen Dr. Arlyce Dice for upper GI problems. He wants to do an upper endoscopy on the pt. He feels there may be some narrowing of the esophagus and that it may require stretching. If this is the case, then the pt would need to be off plavix x 7 days. I will review this with Dr. Juanda Chance and call the pt back. The pt is agreeable wit this. Sherri Rad, RN, BSN  February 07, 2010 1:09 PM   I discussed the above with Dr. Juanda Chance. Per Dr. Juanda Chance, he prefers the pt not come off plavix until at least 7/11 when she will be 1 year out from her stent. I have explained this to the pt and she will notify Dr. Marzetta Board office. Follow-up by: Sherri Rad, RN, BSN,  February 07, 2010 6:26 PM

## 2011-01-08 NOTE — Procedures (Signed)
Summary: EGD/[facility]  EGD/[facility]   Imported By: Sherian Rein 02/07/2010 14:42:49  _____________________________________________________________________  External Attachment:    Type:   Image     Comment:   External Document

## 2011-01-08 NOTE — Assessment & Plan Note (Signed)
Summary: 4-6 MO ROV /NWS  #   Vital Signs:  Patient profile:   77 year old female Height:      62 inches (157.48 cm) Weight:      199.12 pounds (90.51 kg) O2 Sat:      97 % on Room air Temp:     98.3 degrees F (36.83 degrees C) oral Pulse rate:   70 / minute BP sitting:   130 / 82  (left arm) Cuff size:   large  Vitals Entered By: Orlan Leavens RMA (October 16, 2010 10:53 AM)  O2 Flow:  Room air CC: 4-6 month follow-up Is Patient Diabetic? No Pain Assessment Patient in pain? no        Primary Care Christopher Hink:  Newt Lukes MD  CC:  4-6 month follow-up.  History of Present Illness: here for followup  hx GIB/DU 09/2009- hosp for UGIB related to DU with vv (s/p EGD - dr. hung - caut and clip) - neg antibody for h. pylori so no abx assoc with ABL anemia - but no need for transfusion during hosp taking PPI as directed no further abd pain symptoms and no nausea vomitting or melena - no BRBPR- generally avoiding NSAIDs !  CAD s/p MI 720/10 - no CP or anginal symptoms since stent to RCA followed by dr. b. Juanda Chance for same back on ASA/Plavix at dc from hosp despite recent UGI b/c DES was so new (<28mos at time of dx) recent myoview 03/31/10 negative  COPD/smoking - has been cutting back on smoking since MI - down to 4-6 cig/day - prev 2ppd has considered med asst but wants to cont as she is doing for now dyspnea with exertion but improved since starting advair exac for smoking include family stressors  dyslipidemia - reports improved compliance with ongoing medical treatment since labs done 09/2010 and no changes in medication dose or frequency. denies adverse side effects related to current therapy.   DM2 - dx by a1c 05/2010 in setting of hyperglycemia and wt gain - has worked on [employer] and weight loss - down 12# (highest 211# 03/2010) not checking cbgs - following low carb diet  c/o sinus fullness and pressure on R>L - no discharge, no fever or ear pain; +allg symptoms  seasoanlly  Clinical Review Panels:  Immunizations   Last Tetanus Booster:  Historical (12/09/2005)   Last Flu Vaccine:  Historical (08/09/2009)   Last Pneumovax:  Historical (06/08/2009)  Diabetes Management   HgBA1C:  6.6 (05/21/2010)   Creatinine:  0.7 (05/21/2010)   Last Flu Vaccine:  Historical (08/09/2009)   Last Pneumovax:  Historical (06/08/2009)  CBC   WBC:  7.9 (09/13/2010)   RBC:  4.78 (09/13/2010)   Hgb:  13.0 (09/13/2010)   Hct:  38.7 (09/13/2010)   Platelets:  273.0 (09/13/2010)   MCV  80.9 (09/13/2010)   MCHC  33.5 (09/13/2010)   RDW  16.7 (09/13/2010)   PMN:  70.9 (09/13/2010)   Lymphs:  18.5 (09/13/2010)   Monos:  7.5 (09/13/2010)   Eosinophils:  1.8 (09/13/2010)   Basophil:  1.3 (09/13/2010)  Complete Metabolic Panel   Glucose:  99 (05/21/2010)   Sodium:  142 (05/21/2010)   Potassium:  4.5 (05/21/2010)   Chloride:  105 (05/21/2010)   CO2:  27 (05/21/2010)   BUN:  16 (05/21/2010)   Creatinine:  0.7 (05/21/2010)   Albumin:  3.6 (09/13/2010)   Total Protein:  6.5 (09/13/2010)   Calcium:  9.0 (05/21/2010)   Total Bili:  0.3 (09/13/2010)   Alk Phos:  88 (09/13/2010)   SGPT (ALT):  19 (09/13/2010)   SGOT (AST):  21 (09/13/2010)   Current Medications (verified): 1)  Plavix 75 Mg Tabs (Clopidogrel Bisulfate) .... Take One Daily 2)  Simvastatin 40 Mg Tabs (Simvastatin) .... Take One Daily 3)  Metoprolol Succinate 50 Mg Xr24h-Tab (Metoprolol Succinate) .... Take One Daily 4)  Nitroglycerin 0.4 Mg Subl (Nitroglycerin) .... Take As Needed 5)  Potassium 500 Mg .... Take As Needed 6)  Lutein 20 Mg Caps (Lutein) .... One Tab Once Daily 7)  Feosol 200 (65 Fe) Mg Tabs (Ferrous Sulfate Dried) .... One Tab As Needed 8)  Bilberry 500 Mg Caps (Bilberry (Vaccinium Myrtillus)) .... Patient Takes 1000mg  Daily 9)  Tylenol Extra Strength 500 Mg Tabs (Acetaminophen) .... As Needed 10)  Xalatan 0.005 % Soln (Latanoprost) .... One Gtt Both Eyes Hs 11)  Omeprazole 20 Mg  Cpdr (Omeprazole) .... Take 1 Two Times A Day 12)  Milk of Magnesia 7.75 % Susp (Magnesium Hydroxide) .... Take Once Daily 13)  Zyrtec Hives Relief 10 Mg Tabs (Cetirizine Hcl) .... For Allergies 1 Tab Once Daily 14)  Advair Diskus 250-50 Mcg/dose Aepb (Fluticasone-Salmeterol) .... One Puff Two Times A Day 15)  Ventolin Hfa 108 (90 Base) Mcg/act Aers (Albuterol Sulfate) .... Use One Puff Every 4-6 Hours As Needed For Shortness of Breath. 16)  Pepcid 20 Mg Tabs (Famotidine) .... Daily 17)  Aspirin 81 Mg Tbec (Aspirin) .... Take One Tablet By Mouth Daily  Allergies (verified): 1)  ! Demerol 2)  ! * Antibiotics  Past History:  Past Medical History: dyslipidemia CAD with DMI 06/2009 Rx DES RCA with 70% LAD and EF 60% Pseudoaneurysm RFA following PCI 06/2009 Anemia-NOS - hx duodenal ulcer - hx UGIB 09/2009 Glaucoma Anal Fissure Arthritis Hypertension Obesity COPD - mod obst on PFTs 04/04/10 DM2, mild -diet controlled - dx 7/2011a1c 6.6  MD roster: cards - b. bro[facility] - [facility] - young  Review of Systems  The patient denies fever, weight gain, dyspnea on exertion, peripheral edema, headaches, hemoptysis, and abdominal pain.    Physical Exam  General:  alert, well-developed, well-nourished, and cooperative to examination.    Ears:  normal pinnae bilaterally, without erythema, swelling, or tenderness to palpation. TMs clear, without effusion, or cerumen impaction. Hearing grossly normal bilaterally  Nose:  no sinus tenderness to palp Mouth:  teeth and gums in good repair; mucous membranes moist, without lesions or ulcers. oropharynx clear without exudate, no erythema.  Lungs:  normal respiratory effort, no intercostal retractions or use of accessory muscles; normal breath sounds bilaterally - no crackles and no wheezes.    Heart:  normal rate, regular rhythm, no murmur, and no rub. BLE without edema.  Psych:  Oriented X3, memory intact for recent and remote, normally  interactive, good eye contact, not anxious appearing, not depressed appearing, and not agitated.      Impression & Recommendations:  Problem # 1:  HYPERGLYCEMIA (ICD-790.29) a1c 6.6 06/2010 in setting of weight gain -  down weight loss and diet changes ongoing - diet controlled DM approrp pt requerst checking labs NEXT OV - will plan to do same Labs Reviewed: Creat: 0.7 (05/21/2010)     Problem # 2:  COPD (ICD-496)  Her updated medication list for this problem includes:    Advair Diskus 250-50 Mcg/dose Aepb (Fluticasone-salmeterol) ..... One puff two times a day    Ventolin Hfa 108 (90 Base) Mcg/act Aers (Albuterol sulfate) .Marland KitchenMarland KitchenMarland KitchenMarland Kitchen  Use one puff every 4-6 hours as needed for shortness of breath.  She has moderate COPD and shortness of breath with exertion. This is somewhat improved on current medications.  Pulmonary Functions Reviewed: O2 sat: 97 (10/16/2010)     Vaccines Reviewed: Pneumovax: Historical (06/08/2009)   Flu Vax: Historical (08/09/2009)  Problem # 3:  HYPERLIPIDEMIA (ICD-272.4)  now consistent statin use ongoing since 09/2010 - recent labs and med use reviewed - encouraged to cont same Her updated medication list for this problem includes:    Simvastatin 40 Mg Tabs (Simvastatin) .Marland Kitchen... Take one daily  Labs Reviewed: SGOT: 21 (09/13/2010)   SGPT: 19 (09/13/2010)   HDL:47.80 (09/13/2010), 47.50 (10/03/2009)  Chol:259 (09/13/2010), 198 (10/03/2009)  Trig:452.0 (09/13/2010), 272.0 (10/03/2009)  Problem # 4:  ANEMIA-NOS (ICD-285.9)  Her updated medication list for this problem includes:    Feosol 200 (65 Fe) Mg Tabs (Ferrous sulfate dried) ..... One tab as needed  prev related to DU and GIB 06/2009 - no active symptoms, cont PPI  Hgb: 13.0 (09/13/2010)   Hct: 38.7 (09/13/2010)   Platelets: 273.0 (09/13/2010) RBC: 4.78 (09/13/2010)   RDW: 16.7 (09/13/2010)   WBC: 7.9 (09/13/2010) MCV: 80.9 (09/13/2010)   MCHC: 33.5 (09/13/2010) TSH: 1.34 (05/21/2010)  Problem # 5:   CAD, NATIVE VESSEL (ICD-414.01)  Her updated medication list for this problem includes:    Plavix 75 Mg Tabs (Clopidogrel bisulfate) .Marland Kitchen... Take one daily    Metoprolol Succinate 50 Mg Xr24h-tab (Metoprolol succinate) .Marland Kitchen... Take one daily    Nitroglycerin 0.4 Mg Subl (Nitroglycerin) .Marland Kitchen... Take as needed    Aspirin 81 Mg Tbec (Aspirin) .Marland Kitchen... Take one tablet by mouth daily  She had a inferior MI treated with a drug-eluting stent to the RCA in July 2010. She's had no chest pain and is from appears stable.  Labs Reviewed: Chol: 259 (09/13/2010)   HDL: 47.80 (09/13/2010)   TG: 452.0 (09/13/2010)  Time spent with patient 25 minutes, more than 50% of this time was spent counseling patient on weight control and successful loss thus far, med review and problems concerning the need forcompliance with diet and exercise as well as rx meds - also sinus symptoms and use of nasal steroid  Problem # 6:  ALLERGIC RHINITIS CAUSE UNSPECIFIED (ICD-477.9)  Her updated medication list for this problem includes:    Zyrtec Hives Relief 10 Mg Tabs (Cetirizine hcl) .Marland Kitchen... For allergies 1 tab once daily    Flonase 50 Mcg/act Susp (Fluticasone propionate) .Marland Kitchen... 1 spray each nostril  every morning  Discussed use of allergy medications and environmental measures.   Orders: Prescription Created Electronically [id number])  Complete Medication List: 1)  Plavix 75 Mg Tabs (Clopidogrel bisulfate) .... Take one daily 2)  Simvastatin 40 Mg Tabs (Simvastatin) .... Take one daily 3)  Metoprolol Succinate 50 Mg Xr24h-tab (Metoprolol succinate) .... Take one daily 4)  Nitroglycerin 0.4 Mg Subl (Nitroglycerin) .... Take as needed 5)  Potassium 500 Mg  .... Take as needed 6)  Lutein 20 Mg Caps (Lutein) .... One tab once daily 7)  Feosol 200 (65 Fe) Mg Tabs (Ferrous sulfate dried) .... One tab as needed 8)  Bilberry 500 Mg Caps (Bilberry (vaccinium myrtillus)) .... Patient takes 1000mg  daily 9)  Tylenol Extra Strength 500 Mg Tabs  (Acetaminophen) .... As needed 10)  Xalatan 0.005 % Soln (Latanoprost) .... One gtt both eyes hs 11)  Omeprazole 20 Mg Cpdr (Omeprazole) .... Take 1 two times a day 12)  Milk of Magnesia 7.75 % Susp (  Magnesium hydroxide) .... Take once daily 13)  Zyrtec Hives Relief 10 Mg Tabs (Cetirizine hcl) .... For allergies 1 tab once daily 14)  Advair Diskus 250-50 Mcg/dose Aepb (Fluticasone-salmeterol) .... One puff two times a day 15)  Ventolin Hfa 108 (90 Base) Mcg/act Aers (Albuterol sulfate) .... Use one puff every 4-6 hours as needed for shortness of breath. 16)  Pepcid 20 Mg Tabs (Famotidine) .... Daily 17)  Aspirin 81 Mg Tbec (Aspirin) .... Take one tablet by mouth daily 18)  Flonase 50 Mcg/act Susp (Fluticasone propionate) .Marland Kitchen.. 1 spray each nostril  every morning  Patient Instructions: 1)  it was good to see you today. 2)  good job on weigh loss ! you are doing awesome - keep up the good work 3)  try using nose spray for sinus symptoms - your prescription has been electronically submitted to your pharmacy. Please take as directed. Contact our office if you believe you're having problems with the medication(s).  4)  other medications and labs reviewed - no other changes 5)  Please schedule a follow-up appointment in 4-6 months, sooner if problems. will recheck a1c next visit to monitor your sugars levels Prescriptions: FLONASE 50 MCG/ACT SUSP (FLUTICASONE PROPIONATE) 1 spray each nostril  every morning  #1 x 1   Entered and Authorized by:   Newt Lukes MD   Signed by:   Newt Lukes MD on 10/16/2010   Method used:   Electronically to        [facility] (retail)       [address]       [place], [place]  [address]       Ph: [phone]       Fax: [phone]   RxID:   [id number]    Orders Added: 1)  Est. Patient Level IV [34742] 2)  Prescription Created Electronically [phone]

## 2011-01-08 NOTE — Assessment & Plan Note (Signed)
Summary: f60m   Visit Type:  Follow-up Primary Provider:  Newt Lukes MD  CC:  no complaints.  History of Present Illness: Jenny [name] is 77 years old and return for management of CAD. In July of 2010 she had an inferior MI treated with a DES to the right coronary artery. She had residual 70% LAD stenosis and an ejection fraction of 60%. She also has chronic shortness of breath and moderate chronic obstructive pulmonary disease by pulmonary function testing.  She has done well over the last 6 months. She says her breathing is better. She uses her inhalers on a p.r.n. basis.  Her other problems include hypertension hyperlipidemia and diabetes.  Current Medications (verified): 1)  Plavix 75 Mg Tabs (Clopidogrel Bisulfate) .... Take One Daily 2)  Simvastatin 40 Mg Tabs (Simvastatin) .... Take One Daily 3)  Metoprolol Succinate 50 Mg Xr24h-Tab (Metoprolol Succinate) .... Take One Daily 4)  Nitroglycerin 0.4 Mg Subl (Nitroglycerin) .... Take As Needed 5)  Potassium 500 Mg .... Take As Needed 6)  Lutein 20 Mg Caps (Lutein) .... One Tab Once Daily 7)  Feosol 200 (65 Fe) Mg Tabs (Ferrous Sulfate Dried) .... One Tab As Needed 8)  Bilberry 500 Mg Caps (Bilberry (Vaccinium Myrtillus)) .... Patient Takes 1000mg  Daily 9)  Chromium Picolinate Ultra 500 Mcg Tabs (Chromium Picolinate) .... One Tab Q2weeks 10)  Tylenol Extra Strength 500 Mg Tabs (Acetaminophen) .... As Needed 11)  Xalatan 0.005 % Soln (Latanoprost) .... One Gtt Both Eyes Hs 12)  Omeprazole 20 Mg Cpdr (Omeprazole) .... Take 1 Two Times A Day 13)  Milk of Magnesia 7.75 % Susp (Magnesium Hydroxide) .... Take Once Daily 14)  Vitamin B-6 100 Mg Tabs (Pyridoxine Hcl) .... Take 4 To 5 Times Weekly 15)  Zyrtec Hives Relief 10 Mg Tabs (Cetirizine Hcl) .... For Allergies 1 Tab Once Daily 16)  Advair Diskus 250-50 Mcg/dose Aepb (Fluticasone-Salmeterol) .... One Puff Two Times A Day 17)  Ventolin Hfa 108 (90 Base) Mcg/act Aers (Albuterol  Sulfate) .... Use One Puff Every 4-6 Hours As Needed For Shortness of Breath. 18)  Pepcid 20 Mg Tabs (Famotidine) .... Daily 19)  Aspirin 81 Mg Tbec (Aspirin) .... Take One Tablet By Mouth Daily  Allergies (verified): 1)  ! Demerol 2)  ! * Antibiotics  Past History:  Past Medical History: Reviewed history from 05/21/2010 and no changes required. dyslipidemia CAD with DMI 06/2009 Rx DES RCA with 70% LAD and EF 60% Pseudoaneurysm RFA following PCI 06/2009 Anemia-NOS duodenal ulcer - hx UGIB 09/2009 Glaucoma Anal Fissure Arthritis Hypertension Obesity COPD - mod obst on PFTs 04/04/10  MD roster: cards - b. Yechezkel Fertig [facility] - [facility] - young  Review of Systems       ROS is negative except as outlined in HPI.   Vital Signs:  Patient profile:   77 year old female Height:      62 inches Weight:      214 pounds BMI:     39.28 Pulse rate:   67 / minute BP sitting:   150 / 70  (left arm) Cuff size:   large  Vitals Entered By: Burnett Kanaris, CNA (September 13, 2010 3:04 PM)  Physical Exam  Additional Exam:  Gen. Well-nourished, in no distress   Neck: No JVD, thyroid not enlarged, no carotid bruits Lungs: No tachypnea, clear without rales, rhonchi or wheezes Cardiovascular: Rhythm regular, PMI not displaced,  heart sounds  normal, no murmurs or gallops, 1+ left lower extremity  peripheral edema, pulses normal in all 4 extremities. Abdomen: BS normal, abdomen soft and non-tender without masses or organomegaly, no hepatosplenomegaly. MS: No deformities, no cyanosis or clubbing   Neuro:  No focal sns   Skin:  no lesions    Impression & Recommendations:  Problem # 1:  CAD, NATIVE VESSEL (ICD-414.01)  She had a inferior MI treated with a drug-eluting stent to the RCA in July 2010. She's had no chest pain and is from appears stable. Her updated medication list for this problem includes:    Plavix 75 Mg Tabs (Clopidogrel bisulfate) .Marland Kitchen... Take one daily    Metoprolol Succinate 50  Mg Xr24h-tab (Metoprolol succinate) .Marland Kitchen... Take one daily    Nitroglycerin 0.4 Mg Subl (Nitroglycerin) .Marland Kitchen... Take as needed    Aspirin 81 Mg Tbec (Aspirin) .Marland Kitchen... Take one tablet by mouth daily  Orders: EKG w/ Interpretation (93000) TLB-CBC Platelet - w/Differential (85025-CBCD) TLB-Lipid Panel (80061-LIPID) TLB-Hepatic/Liver Function Pnl (80076-HEPATIC)  Problem # 2:  COPD (ICD-496) She has moderate COPD and shortness of breath with exertion. This polyp is somewhat improved on current medications. Her updated medication list for this problem includes:    Advair Diskus 250-50 Mcg/dose Aepb (Fluticasone-salmeterol) ..... One puff two times a day    Ventolin Hfa 108 (90 Base) Mcg/act Aers (Albuterol sulfate) ..... Use one puff every 4-6 hours as needed for shortness of breath.  Problem # 3:  HYPERTENSION, BENIGN (ICD-401.1) The blood pressure is borderline elevated today but she did not take her medicines this morning. It was good and her previous readings. We will continue current therapy. Her updated medication list for this problem includes:    Metoprolol Succinate 50 Mg Xr24h-tab (Metoprolol succinate) .Marland Kitchen... Take one daily    Aspirin 81 Mg Tbec (Aspirin) .Marland Kitchen... Take one tablet by mouth daily  Problem # 4:  HYPERLIPIDEMIA (ICD-272.4)  We'll get a lipid profile today. Her updated medication list for this problem includes:    Simvastatin 40 Mg Tabs (Simvastatin) .Marland Kitchen... Take one daily  Orders: EKG w/ Interpretation (93000) TLB-CBC Platelet - w/Differential (85025-CBCD) TLB-Lipid Panel (80061-LIPID) TLB-Hepatic/Liver Function Pnl (80076-HEPATIC)  Patient Instructions: 1)  Labwork today: cbc/lipid/liver (414.01;272.2;402.10) 2)  Your physician recommends that you continue on your current medications as directed. Please refer to the Current Medication list given to you today. 3)  Your physician wants you to follow-up in: 1 year with Dr. Clifton James.  You will receive a reminder letter in the  mail two months in advance. If you don't receive a letter, please call our office to schedule the follow-up appointment.

## 2011-01-08 NOTE — Miscellaneous (Signed)
Summary: Orders Update pft charges  Clinical Lists Changes  Orders: Added new Service order of Carbon Monoxide diffusing w/capacity (94720) - Signed Added new Service order of Lung Volumes (94240) - Signed Added new Service order of Spirometry (Pre & Post) (94060) - Signed 

## 2011-01-08 NOTE — Progress Notes (Signed)
Summary: Rx refill req  Phone Note Refill Request Message from:  Patient on July 25, 2010 4:14 PM  Refills Requested: Medication #1:  PLAVIX 75 MG TABS take one daily   Dosage confirmed as above?Dosage Confirmed   Supply Requested: 9 months  Medication #2:  SIMVASTATIN 40 MG TABS take one daily   Dosage confirmed as above?Dosage Confirmed   Supply Requested: 9 months  Medication #3:  METOPROLOL SUCCINATE 50 MG XR24H-TAB take one daily   Dosage confirmed as above?Dosage Confirmed   Supply Requested: 9 months  Method Requested: Electronic Initial call taken by: Margaret Pyle, CMA,  July 25, 2010 4:14 PM    Prescriptions: METOPROLOL SUCCINATE 50 MG XR24H-TAB (METOPROLOL SUCCINATE) take one daily  #30 x 8   Entered by:   Margaret Pyle, CMA   Authorized by:   Newt Lukes MD   Signed by:   Margaret Pyle, CMA on 07/25/2010   Method used:   Electronically to        [facility] (retail)       [address]       [place], [place]  [address]       Ph: [phone]       Fax: [phone]   RxID:   [id number] SIMVASTATIN 40 MG TABS (SIMVASTATIN) take one daily  #30 x 8   Entered by:   Margaret Pyle, CMA   Authorized by:   Newt Lukes MD   Signed by:   Margaret Pyle, CMA on 07/25/2010   Method used:   Electronically to        [facility] (retail)       [address]       [place], [place]  [id number]       Ph: [phone]       Fax: [phone]   RxID:   [id number] PLAVIX 75 MG TABS (CLOPIDOGREL BISULFATE) take one daily  #30 x 8   Entered by:   Margaret Pyle, CMA   Authorized by:   Newt Lukes MD   Signed by:   Margaret Pyle, CMA on 07/25/2010   Method used:   Electronically to        [facility] (retail)       [address]       [place], [place]  [address]       Ph: [phone]       Fax:  [phone]   RxID:   [id number]

## 2011-01-08 NOTE — Letter (Signed)
Summary: [facility]  [facility]   Imported By: Sherian Rein 07/31/2010 08:12:01  _____________________________________________________________________  External Attachment:    Type:   Image     Comment:   External Document

## 2011-01-08 NOTE — Assessment & Plan Note (Signed)
Summary: 3-4 MOS F/U #/CD   Vital Signs:  Patient profile:   77 year old female Height:      62 inches (157.48 cm) Weight:      209.0 pounds (95.00 kg) O2 Sat:      98 % on Room air Temp:     98.0 degrees F (36.67 degrees C) oral Pulse rate:   75 / minute BP sitting:   128 / 82  (left arm) Cuff size:   regular  Vitals Entered By: Orlan Leavens (May 21, 2010 11:08 AM)  O2 Flow:  Room air CC: 3 month follow-up Is Patient Diabetic? No Pain Assessment Patient in pain? no        Primary Care Provider:  Newt Lukes MD  CC:  3 month follow-up.  History of Present Illness: here for 3 month followup  c/o weight gain - over 30lbs up in last year - denies adv eating habits, late snacks or inactivity - feels clothes fit better and eating is improved compared to 1 years ago desp[ite weigth gain - no PU/PD - no fatigue and no skin/hair changes  hx GIB/DU 09/2009- hosp 10/28 - 11/01 for UGIB related to DU with vv (s/p EGD - dr. hung - caut and clip) - neg antibody for h. pylori so no abx assoc with ABL anemia - but no need for transfusion during hosp taking PPI as directed no further abd pain symptoms and no nausea vomitting or melena - no BRBPR- generally avoiding NSAIDs - but +ASA in Alkaseltzer cold med she took x 1 week last mo - off Alleve!  CAD s/p MI in 7/10 - no CP or anginal symptoms since stent to RCA followed by dr. b. Juanda Chance for same back on ASA/Plavix at dc from hosp despite recent UGI b/c DES was so new (<42mos at time of dx) recent myoview 03/31/10 negative  COPD/smoking - has been cutting back on smoking since MI - down to 4-6 cig/day - prev 2ppd has considered med asst but wants to cont as she is doing for now dyspnea with exertion but improved since starting advair exac for smoking include family stressors  dyslipidemia - reports compliance with ongoing medical treatment and no changes in medication dose or frequency. denies adverse side effects  related to current therapy.     Clinical Review Panels:  Immunizations   Last Tetanus Booster:  Historical (12/09/2005)   Last Flu Vaccine:  Historical (08/09/2009)   Last Pneumovax:  Historical (06/08/2009)  Lipid Management   Cholesterol:  198 (10/03/2009)   HDL (good cholesterol):  47.50 (10/03/2009)  CBC   WBC:  6.6 (03/09/2010)   RBC:  5.19 (03/09/2010)   Hgb:  12.2 (03/09/2010)   Hct:  40.3 (03/09/2010)   Platelets:  367 (03/09/2010)   MCV  77.6 (03/09/2010)   MCHC  30.3 (03/09/2010)   RDW  18.4 (03/09/2010)   PMN:  67 (03/09/2010)   Lymphs:  22 (03/09/2010)   Monos:  9 (03/09/2010)   Eosinophils:  2 (03/09/2010)   Basophil:  1 (03/09/2010)  Complete Metabolic Panel   Glucose:  152 (10/09/2009)   Sodium:  142 (10/09/2009)   Potassium:  3.5 (10/09/2009)   Chloride:  109 (10/09/2009)   CO2:  29 (10/09/2009)   BUN:  7 (10/09/2009)   Creatinine:  0.75 (10/09/2009)   Albumin:  2.9 (10/09/2009)   Total Protein:  5.0 (10/09/2009)   Calcium:  8.2 (10/09/2009)   Total Bili:  0.4 (10/09/2009)   Alk  Phos:  58 (10/09/2009)   SGPT (ALT):  16 (10/09/2009)   SGOT (AST):  20 (10/09/2009)   Current Medications (verified): 1)  Plavix 75 Mg Tabs (Clopidogrel Bisulfate) .... Take One Daily 2)  Simvastatin 40 Mg Tabs (Simvastatin) .... Take One Daily 3)  Metoprolol Succinate 50 Mg Xr24h-Tab (Metoprolol Succinate) .... Take One Daily 4)  Nitroglycerin 0.4 Mg Subl (Nitroglycerin) .... Take As Needed 5)  Potassium 500 Mg .... Take As Needed 6)  Lutein 20 Mg Caps (Lutein) .... One Tab Once Daily 7)  Feosol 200 (65 Fe) Mg Tabs (Ferrous Sulfate Dried) .... One Tab As Needed 8)  Bilberry 500 Mg Caps (Bilberry (Vaccinium Myrtillus)) .... Patient Takes 1000mg  Daily 9)  Chromium Picolinate Ultra 500 Mcg Tabs (Chromium Picolinate) .... One Tab Q2weeks 10)  Tylenol Extra Strength 500 Mg Tabs (Acetaminophen) .... As Needed 11)  Xalatan 0.005 % Soln (Latanoprost) .... One Gtt Both Eyes  Hs 12)  Omeprazole 20 Mg Cpdr (Omeprazole) .... Take 1 Two Times A Day 13)  Milk of Magnesia 7.75 % Susp (Magnesium Hydroxide) .... Take Once Daily 14)  Vitamin B-6 100 Mg Tabs (Pyridoxine Hcl) .... Take 4 To 5 Times Weekly 15)  Zyrtec Hives Relief 10 Mg Tabs (Cetirizine Hcl) .... For Allergies 1 Tab Once Daily 16)  Advair Diskus 250-50 Mcg/dose Aepb (Fluticasone-Salmeterol) .... One Puff Two Times A Day 17)  Ventolin Hfa 108 (90 Base) Mcg/act Aers (Albuterol Sulfate) .... Use One Puff Every 4-6 Hours As Needed For Shortness of Breath.  Allergies (verified): 1)  ! Demerol 2)  ! * Antibiotics  Past History:  Past Medical History: dyslipidemia CAD with DMI 06/2009 Rx DES RCA with 70% LAD and EF 60% Pseudoaneurysm RFA following PCI 06/2009 Anemia-NOS duodenal ulcer - hx UGIB 09/2009 Glaucoma Anal Fissure Arthritis Hypertension Obesity COPD - mod obst on PFTs 04/04/10  MD roster: cards - [facility]. [facility] - [facility] - young  Past Surgical History: Right knee surgery Coronary angioplasty and drug-eluting stent to prox RCA 7/10    Review of Systems  The patient denies fever, hoarseness, chest pain, and headaches.    Physical Exam  General:  alert, well-developed, well-nourished, and cooperative to examination.    Lungs:  normal respiratory effort, no intercostal retractions or use of accessory muscles; normal breath sounds bilaterally - no crackles and no wheezes.    Heart:  normal rate, regular rhythm, no murmur, and no rub. BLE without edema.  Psych:  Oriented X3, memory intact for recent and remote, normally interactive, good eye contact, not anxious appearing, not depressed appearing, and not agitated.      Impression & Recommendations:  Problem # 1:  WEIGHT GAIN (ICD-783.1) ?metabolic issue - check labds and refer for nutrition - Time spent with patient 25 minutes, more than 50% of this time was spent counseling patient on diet, exercise and need for attention to type of  food intake, not just total calories Orders: TLB-TSH (Thyroid Stimulating Hormone) (52841-LKG) Nutrition Referral (Nutrition)  Problem # 2:  HYPERTENSION, BENIGN (ICD-401.1) Assessment: Improved  Her updated medication list for this problem includes:    Metoprolol Succinate 50 Mg Xr24h-tab (Metoprolol succinate) .Marland Kitchen... Take one daily  BP today: 128/82 Prior BP: 160/75 (04/06/2010)  Labs Reviewed: K+: 3.5 (10/09/2009) Creat: : 0.75 (10/09/2009)   Chol: 198 (10/03/2009)   HDL: 47.50 (10/03/2009)   TG: 272.0 (10/03/2009)  Problem # 3:  COPD (ICD-496)  PFTs 4/27 reviewed - improved with advair and as needed Alb -  cont same advised again on need to quit smoking... 5 minutes today spent on patient education regarding the unhealthy effects of continued tobacco abuse and encouragment of cessation including medical options available to help patient to quit smoking.  Her updated medication list for this problem includes:    Advair Diskus 250-50 Mcg/dose Aepb (Fluticasone-salmeterol) ..... One puff two times a day    Ventolin Hfa 108 (90 Base) Mcg/act Aers (Albuterol sulfate) ..... Use one puff every 4-6 hours as needed for shortness of breath.  Pulmonary Functions Reviewed: O2 sat: 98 (05/21/2010)     Vaccines Reviewed: Pneumovax: Historical (06/08/2009)   Flu Vax: Historical (08/09/2009)  Problem # 4:  ANEMIA-NOS (ICD-285.9) Assessment: Improved prev related to DU and GIB 06/2009 - no active symptoms, cont PPI Her updated medication list for this problem includes:    Feosol 200 (65 Fe) Mg Tabs (Ferrous sulfate dried) ..... One tab as needed  Hgb: 12.2 (03/09/2010)   Hct: 40.3 (03/09/2010)   Platelets: 367 (03/09/2010) RBC: 5.19 (03/09/2010)   RDW: 18.4 (03/09/2010)   WBC: 6.6 (03/09/2010) MCV: 77.6 (03/09/2010)   MCHC: 30.3 (03/09/2010)  Problem # 5:  HYPERGLYCEMIA (ICD-790.29)  Orders: TLB-BMP (Basic Metabolic Panel-BMET) (80048-METABOL) TLB-A1C / Hgb A1C (Glycohemoglobin)  (83036-A1C)  Labs Reviewed: Creat: 0.75 (10/09/2009)     Complete Medication List: 1)  Plavix 75 Mg Tabs (Clopidogrel bisulfate) .... Take one daily 2)  Simvastatin 40 Mg Tabs (Simvastatin) .... Take one daily 3)  Metoprolol Succinate 50 Mg Xr24h-tab (Metoprolol succinate) .... Take one daily 4)  Nitroglycerin 0.4 Mg Subl (Nitroglycerin) .... Take as needed 5)  Potassium 500 Mg  .... Take as needed 6)  Lutein 20 Mg Caps (Lutein) .... One tab once daily 7)  Feosol 200 (65 Fe) Mg Tabs (Ferrous sulfate dried) .... One tab as needed 8)  Bilberry 500 Mg Caps (Bilberry (vaccinium myrtillus)) .... Patient takes 1000mg  daily 9)  Chromium Picolinate Ultra 500 Mcg Tabs (Chromium picolinate) .... One tab q2weeks 10)  Tylenol Extra Strength 500 Mg Tabs (Acetaminophen) .... As needed 11)  Xalatan 0.005 % Soln (Latanoprost) .... One gtt both eyes hs 12)  Omeprazole 20 Mg Cpdr (Omeprazole) .... Take 1 two times a day 13)  Milk of Magnesia 7.75 % Susp (Magnesium hydroxide) .... Take once daily 14)  Vitamin B-6 100 Mg Tabs (Pyridoxine hcl) .... Take 4 to 5 times weekly 15)  Zyrtec Hives Relief 10 Mg Tabs (Cetirizine hcl) .... For allergies 1 tab once daily 16)  Advair Diskus 250-50 Mcg/dose Aepb (Fluticasone-salmeterol) .... One puff two times a day 17)  Ventolin Hfa 108 (90 Base) Mcg/act Aers (Albuterol sulfate) .... Use one puff every 4-6 hours as needed for shortness of breath.  Other Orders: Tobacco use cessation intermediate 3-10 minutes (74259)  Patient Instructions: 1)  it was good to see you today. 2)  test(s) ordered today - your results will be posted on the phone tree for review in 48-72 hours from the time of test completion; call [phone] and enter your 9 digit MRN (listed above on this page, just below your name); if any changes need to be made or there are abnormal results, you will be contacted directly.  3)  we'll make referral to nutritionist to help with weight control. Our office  will contact you regarding this appointment once made.  4)  medications reviewed - no changes, refills done as requested 5)  Please schedule a follow-up appointment in 4-6 months, sooner if problems.  Prescriptions: ADVAIR DISKUS  250-50 MCG/DOSE AEPB (FLUTICASONE-SALMETEROL) one puff two times a day  #1 x 11   Entered and Authorized by:   Newt Lukes MD   Signed by:   Newt Lukes MD on 05/21/2010   Method used:   Electronically to        [facility] (retail)       [address]       [place], [place]  [id number]       Ph: [phone]       Fax: [phone]   RxID:   [id number]   Prevention & Chronic Care Immunizations   Influenza vaccine: Historical  (08/09/2009)    Tetanus booster: 12/09/2005: Historical    Pneumococcal vaccine: Historical  (06/08/2009)    H. zoster vaccine: Not documented  Colorectal Screening   Hemoccult: Not documented    Colonoscopy: Not documented  Other Screening   Pap smear: Not documented    Mammogram: Not documented    DXA bone density scan: Not documented   Smoking status: current  (10/11/2009)   Smoking cessation counseling: yes  (10/11/2009)  Lipids   Total Cholesterol: 198  (10/03/2009)   LDL: Not documented   LDL Direct: 105.1  (10/03/2009)   HDL: 47.50  (10/03/2009)   Triglycerides: 272.0  (10/03/2009)    SGOT (AST): 20  (10/09/2009)   SGPT (ALT): 16  (10/09/2009)   Alkaline phosphatase: 58  (10/09/2009)   Total bilirubin: 0.4  (10/09/2009)  Hypertension   Last Blood Pressure: 128 / 82  (05/21/2010)   Serum creatinine: 0.75  (10/09/2009)   Serum potassium 3.5  (10/09/2009)  Self-Management Support :   Referred.    Hypertension self-management support: Not documented    Lipid self-management support: Not documented

## 2011-01-08 NOTE — Progress Notes (Signed)
----   Converted from flag ---- ---- 01/12/2010 3:03 PM, Newt Lukes MD wrote: yes - thanks  ---- 01/12/2010 1:45 PM, Dagoberto Reef wrote: Dr Felicity Coyer, Ms Fults is a patient of Dr Arlyce Dice her appt is schedule on 02/05/10 @ 2pm with Dr Arlyce Dice is this ok ?    Thanks  ---- 01/12/2010 11:40 AM, Newt Lukes MD wrote: The following orders have been entered for this patient and placed on Admin Hold:  Type:     Referral       Code:   GI Description:   Gastroenterology Referral Order Date:   01/12/2010   Authorized By:   Newt Lukes MD Order #:   [id number] Clinical Notes:   [facility] - eval and tx - DU with GIB 09/2009 - eval and tx - gessner or brodie preferred ------------------------------

## 2011-01-08 NOTE — Assessment & Plan Note (Signed)
Summary: 3 MO ROV /NWS #   Vital Signs:  Patient profile:   77 year old female Height:      62 inches (157.48 cm) Weight:      201.8 pounds (91.73 kg) O2 Sat:      98 % on Room air Temp:     97.0 degrees F (36.11 degrees C) oral Pulse rate:   77 / minute BP sitting:   146 / 82  (left arm) Cuff size:   regular  Vitals Entered By: Orlan Leavens (January 12, 2010 10:59 AM)  O2 Flow:  Room air CC: 3 month follow-up Is Patient Diabetic? No Pain Assessment Patient in pain? no        Primary Care Provider:  Newt Lukes MD  CC:  3 month follow-up.  History of Present Illness: here for 3 month followup  GIB/DU 09/2009-  hosp 10/28 - 11/01 for UGIB related to DU with vv (s/p EGD - dr. hung - caut and clip) - neg antibody for h. pylori so no abx assoc with ABL anemia - dc hgb 8.1 - no need for transfusion during hosp taking PPI as directed notes increase gnawing pain over past 4 weeks located in epigastric region - pain symptoms present daily, not significantly change with or without food no nausea vomitting or melena - no BRBPR- generally avoiding NSAIDs - but +ASA in Alkaseltzer cold med she took x 1 week last mo - off Alleve!  CAD s/p MI in 7/10 - no CP or anginal symptoms since stent to RCA followed by dr. b. Juanda Chance for same back on ASA/Plavix at dc from hosp despite recent UGI b/c DES was so new (<63mos at time of dx)  smoking - has been cutting back since MI - down to 4-6 cig/day - prev 2ppd has considered med asst but wants to cont as she is doing for now  dyslipidemia -  reports compliance with ongoing medical treatment and no changes in medication dose or frequency. denies adverse side effects related to current therapy.        Clinical Review Panels:  CBC   WBC:  7.6 (10/11/2009)   RBC:  2.89 (10/11/2009)   Hgb:  9.6 (10/11/2009)   Hct:  28.1 (10/11/2009)   Platelets:  327.0 (10/11/2009)   MCV  97.3 (10/11/2009)   MCHC  34.1 (10/11/2009)   RDW   13.4 (10/11/2009)   PMN:  61.6 (10/11/2009)   Lymphs:  26.6 (10/11/2009)   Monos:  8.5 (10/11/2009)   Eosinophils:  2.4 (10/11/2009)   Basophil:  0.9 (10/11/2009)   Current Medications (verified): 1)  Plavix 75 Mg Tabs (Clopidogrel Bisulfate) .... Take One Daily 2)  Simvastatin 40 Mg Tabs (Simvastatin) .... Take One Daily 3)  Metoprolol Succinate 50 Mg Xr24h-Tab (Metoprolol Succinate) .... Take One Daily 4)  Nitroglycerin 0.4 Mg Subl (Nitroglycerin) .... Take As Needed 5)  Bufferin 325 Mg Tabs (Aspirin Buf(Cacarb-Mgcarb-Mgo)) .... Take One Daily 6)  Zyrtec Hives Relief 10 Mg Tabs (Cetirizine Hcl) .... Take One Daily 7)  Potassium 500 Mg .... Take As Needed 8)  Lutein 20 Mg Caps (Lutein) .... One Tab Once Daily 9)  Feosol 200 (65 Fe) Mg Tabs (Ferrous Sulfate Dried) .... One Tab As Needed 10)  Glucosamine-Chondroitin 500-400 Mg Caps (Glucosamine-Chondroitin) .... Daily 11)  Bilberry 500 Mg Caps (Bilberry (Vaccinium Myrtillus)) .... Patient Takes 1000mg  Daily 12)  Chromium Picolinate Ultra 500 Mcg Tabs (Chromium Picolinate) .... One Tab Q2weeks 13)  Oil of Oregano  1500 Mg Caps (Oregano) .... One Tab Two Times A Day 14)  Tylenol Extra Strength 500 Mg Tabs (Acetaminophen) .... As Needed 15)  Yaest Fungal Detox .Marland Kitchen.. 5 Tabs Daily 16)  Xalatan 0.005 % Soln (Latanoprost) .... One Gtt Both Eyes Hs 17)  Pepcid Complete 10-800-165 Mg Chew (Famotidine-Ca Carb-Mag Hydrox) .... As Needed 18)  Maalox Regular Strength 225-200-25 Mg/58ml Susp (Alum & Mag Hydroxide-Simeth) .... As Needed 19)  Omeprazole 20 Mg Cpdr (Omeprazole) .... Take 1 Two Times A Day 20)  Milk of Magnesia 7.75 % Susp (Magnesium Hydroxide) .... Take Once Daily 21)  Vitamin B-6 100 Mg Tabs (Pyridoxine Hcl) .... Take 2 By Mouth Qd  Allergies (verified): 1)  ! Demerol  Past History:  Past Medical History: dyslipidemia CAD with DMI 06/2009 Rx DES RCA with 70% LAD and EF 60% Pseudoaneurysm RFA following PCI  06/2009 Anemia-NOS duodenal ulcer - hx UGIB 10/10  MD rooster: cards - b. [facility] - prev hung, now to be [facility]  Review of Systems  The patient denies anorexia, weight loss, hoarseness, syncope, dyspnea on exertion, peripheral edema, melena, hematochezia, and severe indigestion/heartburn.    Physical Exam  General:  alert, well-developed, well-nourished, and cooperative to examination.    Eyes:  vision grossly intact; pupils equal, round and reactive to light.  conjunctiva and lids normal.   wears glasses Lungs:  normal respiratory effort, no intercostal retractions or use of accessory muscles; normal breath sounds bilaterally - no crackles and no wheezes.    Heart:  normal rate, regular rhythm, no murmur, and no rub. BLE without edema.  Abdomen:  soft, non-tender, normal bowel sounds, no distention; no masses and no appreciable hepatomegaly or splenomegaly.     Impression & Recommendations:  Problem # 1:  ACUT DUOD ULCER W/HEMORR&PERF W/O MENTION OBST (ICD-532.20) hx DUB 09/2009 with VV and epi tx during hospitalization for same on PPI two times a day but inc in "gnawing" symptoms -  refer to GI for f/u, ?re-eval - pt prefers to stay within [facility] transient exposure to NSAIDs in alkaseltzer cold med last mo x 1 week Her updated medication list for this problem includes:    Pepcid Complete 10-800-165 Mg Chew (Famotidine-ca carb-mag hydrox) .Marland Kitchen... As needed    Maalox Regular Strength 225-200-25 Mg/28ml Susp (Alum & mag hydroxide-simeth) .Marland Kitchen... As needed    Omeprazole 20 Mg Cpdr (Omeprazole) .Marland Kitchen... Take 1 two times a day  Labs Reviewed: Hgb: 9.6 (10/11/2009)   Hct: 28.1 (10/11/2009)     Orders: TLB-CBC Platelet - w/Differential (85025-CBCD) Tobacco use cessation intermediate 3-10 minutes (19147) Gastroenterology Referral (GI)  Problem # 2:  ANEMIA-NOS (ICD-285.9) see above - check labs and refer to GI Her updated medication list for this problem includes:    Feosol 200 (65  Fe) Mg Tabs (Ferrous sulfate dried) ..... One tab as needed  Orders: TLB-CBC Platelet - w/Differential (85025-CBCD) Gastroenterology Referral (GI)  Hgb: 9.6 (10/11/2009)   Hct: 28.1 (10/11/2009)   Platelets: 327.0 (10/11/2009) RBC: 2.89 (10/11/2009)   RDW: 13.4 (10/11/2009)   WBC: 7.6 (10/11/2009) MCV: 97.3 (10/11/2009)   MCHC: 34.1 (10/11/2009)  Problem # 3:  CAD, NATIVE VESSEL (ICD-414.01)  cont med mgmt as per cards - no anginal symptoms  Her updated medication list for this problem includes:    Plavix 75 Mg Tabs (Clopidogrel bisulfate) .Marland Kitchen... Take one daily    Metoprolol Succinate 50 Mg Xr24h-tab (Metoprolol succinate) .Marland Kitchen... Take one daily    Nitroglycerin 0.4 Mg Subl (Nitroglycerin) .Marland KitchenMarland KitchenMarland KitchenMarland Kitchen  Take as needed    Bufferin 325 Mg Tabs (Aspirin buf(cacarb-mgcarb-mgo)) .Marland Kitchen... Take one daily  Labs Reviewed: Chol: 198 (10/03/2009)   HDL: 47.50 (10/03/2009)   TG: 272.0 (10/03/2009)  Orders: Tobacco use cessation intermediate 3-10 minutes (60454)  Problem # 4:  HYPERLIPIDEMIA (ICD-272.4)  Her updated medication list for this problem includes:    Simvastatin 40 Mg Tabs (Simvastatin) .Marland Kitchen... Take one daily  Labs Reviewed: SGOT: 20 (10/09/2009)   SGPT: 16 (10/09/2009)   HDL:47.50 (10/03/2009)  Chol:198 (10/03/2009)  Trig:272.0 (10/03/2009)  Problem # 5:  TOBACCO ABUSE (ICD-305.1)  5 minutes today spent on patient education regarding the unhealthy effects of continued tobacco abuse, esp as related to recent ulcer and CAD/MI - provided encouragment of cessation including medical options available to help patient to quit smoking.   Orders: Tobacco use cessation intermediate 3-10 minutes (99406)  Complete Medication List: 1)  Plavix 75 Mg Tabs (Clopidogrel bisulfate) .... Take one daily 2)  Simvastatin 40 Mg Tabs (Simvastatin) .... Take one daily 3)  Metoprolol Succinate 50 Mg Xr24h-tab (Metoprolol succinate) .... Take one daily 4)  Nitroglycerin 0.4 Mg Subl (Nitroglycerin) .... Take as  needed 5)  Bufferin 325 Mg Tabs (Aspirin buf(cacarb-mgcarb-mgo)) .... Take one daily 6)  Zyrtec Hives Relief 10 Mg Tabs (Cetirizine hcl) .... Take one daily 7)  Potassium 500 Mg  .... Take as needed 8)  Lutein 20 Mg Caps (Lutein) .... One tab once daily 9)  Feosol 200 (65 Fe) Mg Tabs (Ferrous sulfate dried) .... One tab as needed 10)  Glucosamine-chondroitin 500-400 Mg Caps (Glucosamine-chondroitin) .... Daily 11)  Bilberry 500 Mg Caps (Bilberry (vaccinium myrtillus)) .... Patient takes 1000mg  daily 12)  Chromium Picolinate Ultra 500 Mcg Tabs (Chromium picolinate) .... One tab q2weeks 13)  Oil of Oregano 1500 Mg Caps (Oregano) .... One tab two times a day 14)  Tylenol Extra Strength 500 Mg Tabs (Acetaminophen) .... As needed 15)  Yaest Fungal Detox  .Marland Kitchen.. 5 tabs daily 16)  Xalatan 0.005 % Soln (Latanoprost) .... One gtt both eyes hs 17)  Pepcid Complete 10-800-165 Mg Chew (Famotidine-ca carb-mag hydrox) .... As needed 18)  Maalox Regular Strength 225-200-25 Mg/65ml Susp (Alum & mag hydroxide-simeth) .... As needed 19)  Omeprazole 20 Mg Cpdr (Omeprazole) .... Take 1 two times a day 20)  Milk of Magnesia 7.75 % Susp (Magnesium hydroxide) .... Take once daily 21)  Vitamin B-6 100 Mg Tabs (Pyridoxine hcl) .... Take 2 by mouth qd  Patient Instructions: 1)  it was good to see you today.  2)  test(s) ordered today - your results will be posted on the phone tree for review in 48-72 hours from the time of test completion; call [phone] and enter your 9 digit MRN (listed above on this page, just below your name); if any changes need to be made or there are abnormal results, you will be contacted directly.  3)  we'll make referral to [facility] for followup on your ulcer. Our office will contact you regarding this appointment once made.  4)  Congrats on backing off the cigarettes! keep up the good work - 5)  Please schedule a follow-up appointment in 3-4 months, sooner if problems.   Prescriptions: OMEPRAZOLE 20 MG CPDR (OMEPRAZOLE) take 1 two times a day  #60 x 11   Entered and Authorized by:   Newt Lukes MD   Signed by:   Newt Lukes MD on 01/12/2010   Method used:   Electronically to  [facility] (retail)       [address]       [place], [place]  [id number]       Ph: [phone]       Fax: [phone]   RxID:   [id number]

## 2011-01-08 NOTE — Assessment & Plan Note (Signed)
Summary: Jenny [name]   Primary Provider:  Newt Lukes MD  CC:  [id number]. Pt complains she has some SOB with exertion as well as edema in the feet and legs.  .  History of Present Illness: the patient is 77 years old and return for management of CAD. She had an anterior MI in July of 2010 treated with a drug-eluting stent to the right coronary. This was complicated by a false aneurysm. In October she was admitted with a GI bleed and was found to have an active duodenal ulcer on endoscopy.  She recovered from all this but recently she says she has had increasing difficulty with shortness of breath with exertion. She does walk across the room and she gets short of breath. She does have known COPD and is a smoker. She's cut back from the carton a week to a carton every 3-4 weeks.  Her other problems include hypertension, hyperlipidemia, and diabetes.  She also says that she's had increased swelling in her left lower extremity recently. S Current Medications (verified): 1)  Plavix 75 Mg Tabs (Clopidogrel Bisulfate) .... Take One Daily 2)  Simvastatin 40 Mg Tabs (Simvastatin) .... Take One Daily 3)  Metoprolol Succinate 50 Mg Xr24h-Tab (Metoprolol Succinate) .... Take One Daily 4)  Nitroglycerin 0.4 Mg Subl (Nitroglycerin) .... Take As Needed 5)  Potassium 500 Mg .... Take As Needed 6)  Lutein 20 Mg Caps (Lutein) .... One Tab Once Daily 7)  Feosol 200 (65 Fe) Mg Tabs (Ferrous Sulfate Dried) .... One Tab As Needed 8)  Bilberry 500 Mg Caps (Bilberry (Vaccinium Myrtillus)) .... Patient Takes 1000mg  Daily 9)  Chromium Picolinate Ultra 500 Mcg Tabs (Chromium Picolinate) .... One Tab Q2weeks 10)  Tylenol Extra Strength 500 Mg Tabs (Acetaminophen) .... As Needed 11)  Xalatan 0.005 % Soln (Latanoprost) .... One Gtt Both Eyes Hs 12)  Omeprazole 20 Mg Cpdr (Omeprazole) .... Take 1 Two Times A Day 13)  Milk of Magnesia 7.75 % Susp (Magnesium Hydroxide) .... Take Once Daily 14)  Vitamin B-6 100 Mg Tabs  (Pyridoxine Hcl) .... Take 4 To 5 Times Weekly  Allergies (verified): 1)  ! Demerol 2)  ! * Antibiotics  Past History:  Past Medical History: Reviewed history from 02/05/2010 and no changes required. 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 [facility] - prev hung, now to be LeB Glaucoma Upper GI Bleed 10/2009 Anal Fissure Arthritis Hypertension Obesity Pneumonia  Review of Systems       ROS is negative except as outlined in HPI.   Vital Signs:  Patient profile:   77 year old female Height:      62 inches Weight:      204 pounds BMI:     37.45 O2 Sat:      95 % on Room air Pulse rate:   73 / minute Pulse rhythm:   regular BP sitting:   142 / 76  (left arm) Cuff size:   large  Vitals Entered By: Judithe Modest CMA (March 09, 2010 3:28 PM)  O2 Flow:  Room air  Physical Exam  Additional Exam:  Gen. Well-nourished, in no distress   Neck: No JVD, thyroid not enlarged, no carotid bruits Lungs: No tachypnea, clear without rales, rhonchi or wheezes Cardiovascular: Rhythm regular, PMI not displaced,  heart sounds  normal, no murmurs or gallops, no peripheral  edema, pulses normal in all 4 extremities. Abdomen: BS normal, abdomen soft and non-tender without masses or organomegaly, no hepatosplenomegaly. MS: No deformities, no cyanosis or clubbing   Neuro:  No focal sns   Skin:  no lesions    Impression & Recommendations:  Problem # 1:  SHORTNESS OF BREATH (ICD-786.05)  She has shortness of breath with minimal exertion which has become somewhat disabling. She has no associated chest pain. I suspect this is related to her COPD but it could be an anginal equivalent. We plan to evaluate her further with I elect to scan Myoview and pulmonary function tests. We'll also get an O2 saturation today. I'll see her back in 4 weeks. Her updated  medication list for this problem includes:    Metoprolol Succinate 50 Mg Xr24h-tab (Metoprolol succinate) .Marland Kitchen... Take one daily  Her updated medication list for this problem includes:    Metoprolol Succinate 50 Mg Xr24h-tab (Metoprolol succinate) .Marland Kitchen... Take one daily  Orders: T-CBC w/Diff (57846-96295) Pulmonary Function Test (PFT) Venous Duplex Lower Extremity (Venous Duplex Lower) Nuclear Stress Test (Nuc Stress Test)  Problem # 2:  EDEMA (ICD-782.3) She has increased edema of the left lower extremity and I am concerned about the possibility of DVT. We will get venous Dopplers.  Problem # 3:  CAD, NATIVE VESSEL (ICD-414.01) She had a inferior MI in July treated with Advil he stented the right coronary. We plan to evaluate whether her dyspnea is related to ischemia. Her updated medication list for this problem includes:    Plavix 75 Mg Tabs (Clopidogrel bisulfate) .Marland Kitchen... Take one daily    Metoprolol Succinate 50 Mg Xr24h-tab (Metoprolol succinate) .Marland Kitchen... Take one daily    Nitroglycerin 0.4 Mg Subl (Nitroglycerin) .Marland Kitchen... Take as needed  Orders: EKG w/ Interpretation (93000) Pulmonary Function Test (PFT) Venous Duplex Lower Extremity (Venous Duplex Lower) Nuclear Stress Test (Nuc Stress Test)  Her updated medication list for this problem includes:    Plavix 75 Mg Tabs (Clopidogrel bisulfate) .Marland Kitchen... Take one daily    Metoprolol Succinate 50 Mg Xr24h-tab (Metoprolol succinate) .Marland Kitchen... Take one daily    Nitroglycerin 0.4 Mg Subl (Nitroglycerin) .Marland Kitchen... Take as needed  Problem # 4:  TOBACCO ABUSE (ICD-305.1) She is still smoking and working toward decreasing this. We encouraged her.  Problem # 5:  ANEMIA-NOS (ICD-285.9) She has a history of GI bleed from a duodenal ulcer. We will get a CBC today.  Patient Instructions: 1)  Your physician recommends that you schedule a follow-up appointment in: 4 weeks. 2)  Your physician has requested that you have a lower extremity venous duplex.  This  test is an ultrasound of the veins in the legs or arms.  It looks at venous blood flow that carries blood from the heart to the legs or arms.  Allow one hour for a Lower Venous exam.  Allow thirty minutes for an Upper Venous exam. There are no restrictions or special instructions. 3)  Your physician has requested that you have an adenosine myoview.  For further information please visit [email or web address]  Please follow instruction sheet, as given. 4)  Your physician has recommended that you have a pulmonary function test.  Pulmonary Function Tests are a group of tests that measure how well air moves in and out of your lungs. 5)  Your physician recommends that you have lab work today: cbc (786.05)

## 2011-03-13 LAB — COMPREHENSIVE METABOLIC PANEL
ALT: 16 U/L (ref 0–35)
Albumin: 2.9 g/dL — ABNORMAL LOW (ref 3.5–5.2)
Chloride: 109 mEq/L (ref 96–112)
Creatinine, Ser: 0.75 mg/dL (ref 0.4–1.2)
GFR calc Af Amer: >60 mL/min (ref >60)
Glucose, Bld: 152 mg/dL — ABNORMAL HIGH (ref 70–99)
Potassium: 3.5 mEq/L (ref 3.5–5.1)
Sodium: 142 mEq/L (ref 135–145)
Total Bilirubin: 0.4 mg/dL (ref 0.3–1.2)

## 2011-03-13 LAB — CBC
Hemoglobin: 8.1 g/dL — ABNORMAL LOW (ref 12.0–15.0)
MCHC: 35.4 g/dL (ref 30.0–36.0)
Platelets: 205 10*3/uL (ref 150–400)
RBC: 2.41 MIL/uL — ABNORMAL LOW (ref 3.87–5.11)
RDW: 13.6 % (ref 11.5–15.5)
WBC: 5.1 10*3/uL (ref 4.0–10.5)

## 2011-03-14 LAB — CBC
HCT: 24 % — ABNORMAL LOW (ref 36.0–46.0)
HCT: 24.1 % — ABNORMAL LOW (ref 36.0–46.0)
HCT: 24.8 % — ABNORMAL LOW (ref 36.0–46.0)
HCT: 25.9 % — ABNORMAL LOW (ref 36.0–46.0)
HCT: 26.3 % — ABNORMAL LOW (ref 36.0–46.0)
HCT: 26.8 % — ABNORMAL LOW (ref 36.0–46.0)
HCT: 28.8 % — ABNORMAL LOW (ref 36.0–46.0)
Hemoglobin: 10 g/dL — ABNORMAL LOW (ref 12.0–15.0)
Hemoglobin: 11.6 g/dL — ABNORMAL LOW (ref 12.0–15.0)
Hemoglobin: 8.9 g/dL — ABNORMAL LOW (ref 12.0–15.0)
Hemoglobin: 9.1 g/dL — ABNORMAL LOW (ref 12.0–15.0)
Hemoglobin: 9.4 g/dL — ABNORMAL LOW (ref 12.0–15.0)
MCHC: 34.5 g/dL (ref 30.0–36.0)
MCHC: 34.6 g/dL (ref 30.0–36.0)
MCHC: 34.6 g/dL (ref 30.0–36.0)
MCHC: 34.8 g/dL (ref 30.0–36.0)
MCV: 94.4 fL (ref 78.0–100.0)
MCV: 94.9 fL (ref 78.0–100.0)
MCV: 95.2 fL (ref 78.0–100.0)
MCV: 95.2 fL (ref 78.0–100.0)
MCV: 95.3 fL (ref 78.0–100.0)
MCV: 96.1 fL (ref 78.0–100.0)
Platelets: 194 10*3/uL (ref 150–400)
Platelets: 203 10*3/uL (ref 150–400)
Platelets: 206 10*3/uL (ref 150–400)
Platelets: 223 10*3/uL (ref 150–400)
RBC: 2.54 MIL/uL — ABNORMAL LOW (ref 3.87–5.11)
RBC: 2.61 MIL/uL — ABNORMAL LOW (ref 3.87–5.11)
RBC: 2.63 MIL/uL — ABNORMAL LOW (ref 3.87–5.11)
RBC: 2.68 MIL/uL — ABNORMAL LOW (ref 3.87–5.11)
RBC: 2.71 MIL/uL — ABNORMAL LOW (ref 3.87–5.11)
RBC: 2.77 MIL/uL — ABNORMAL LOW (ref 3.87–5.11)
RBC: 3.02 MIL/uL — ABNORMAL LOW (ref 3.87–5.11)
RDW: 13.4 % (ref 11.5–15.5)
RDW: 13.7 % (ref 11.5–15.5)
RDW: 13.9 % (ref 11.5–15.5)
RDW: 13.9 % (ref 11.5–15.5)
WBC: 5.9 10*3/uL (ref 4.0–10.5)
WBC: 6 10*3/uL (ref 4.0–10.5)
WBC: 6.1 10*3/uL (ref 4.0–10.5)
WBC: 6.3 10*3/uL (ref 4.0–10.5)
WBC: 8.5 10*3/uL (ref 4.0–10.5)
WBC: 8.8 10*3/uL (ref 4.0–10.5)

## 2011-03-14 LAB — COMPREHENSIVE METABOLIC PANEL
AST: 14 U/L (ref 0–37)
Albumin: 3 g/dL — ABNORMAL LOW (ref 3.5–5.2)
BUN: 51 mg/dL — ABNORMAL HIGH (ref 6–23)
Calcium: 8.9 mg/dL (ref 8.4–10.5)
Chloride: 107 mEq/L (ref 96–112)
Creatinine, Ser: 0.76 mg/dL (ref 0.4–1.2)
GFR calc Af Amer: >60 mL/min (ref >60)
GFR calc non Af Amer: >60 mL/min (ref >60)
Potassium: 4.1 mEq/L (ref 3.5–5.1)
Total Bilirubin: 0.5 mg/dL (ref 0.3–1.2)
Total Protein: 5.3 g/dL — ABNORMAL LOW (ref 6.0–8.3)

## 2011-03-14 LAB — CARDIAC PANEL(CRET KIN+CKTOT+MB+TROPI)
CK, MB: 3.3 ng/mL (ref 0.3–4.0)
CK, MB: 5 ng/mL — ABNORMAL HIGH (ref 0.3–4.0)
Total CK: 62 U/L (ref 7–177)
Total CK: 73 U/L (ref 7–177)

## 2011-03-14 LAB — BASIC METABOLIC PANEL
BUN: 40 mg/dL — ABNORMAL HIGH (ref 6–23)
BUN: 7 mg/dL (ref 6–23)
CO2: 26 mEq/L (ref 19–32)
CO2: 29 mEq/L (ref 19–32)
Calcium: 8 mg/dL — ABNORMAL LOW (ref 8.4–10.5)
Calcium: 8.4 mg/dL (ref 8.4–10.5)
Creatinine, Ser: 0.61 mg/dL (ref 0.4–1.2)
Creatinine, Ser: 0.63 mg/dL (ref 0.4–1.2)
GFR calc Af Amer: >60 mL/min (ref >60)
GFR calc non Af Amer: >60 mL/min (ref >60)
GFR calc non Af Amer: >60 mL/min (ref >60)
GFR calc non Af Amer: >60 mL/min (ref >60)
Glucose, Bld: 105 mg/dL — ABNORMAL HIGH (ref 70–99)
Potassium: 4.4 mEq/L (ref 3.5–5.1)
Sodium: 140 mEq/L (ref 135–145)

## 2011-03-14 LAB — PROTIME-INR
INR: 1.07 (ref 0.00–1.49)
INR: 1.13 (ref 0.00–1.49)

## 2011-03-14 LAB — POCT I-STAT, CHEM 8
Calcium, Ion: 0.97 mmol/L — ABNORMAL LOW (ref 1.12–1.32)
Creatinine, Ser: <0.2 mg/dL — ABNORMAL LOW (ref 0.4–1.2)
Glucose, Bld: 157 mg/dL — ABNORMAL HIGH (ref 70–99)
Hemoglobin: 11.2 g/dL — ABNORMAL LOW (ref 12.0–15.0)

## 2011-03-14 LAB — LIPASE, BLOOD: Lipase: 13 U/L (ref 11–59)

## 2011-03-14 LAB — CROSSMATCH: ABO/RH(D): O POS

## 2011-03-14 LAB — HEMOGLOBIN AND HEMATOCRIT, BLOOD
Hemoglobin: 8.5 g/dL — ABNORMAL LOW (ref 12.0–15.0)
Hemoglobin: 8.6 g/dL — ABNORMAL LOW (ref 12.0–15.0)

## 2011-03-14 LAB — HEMOCCULT GUIAC POC 1CARD (OFFICE): Fecal Occult Bld: POSITIVE

## 2011-03-14 LAB — H. PYLORI ANTIBODY, IGG: H Pylori IgG: <0.4 {ISR}

## 2011-03-14 LAB — TROPONIN I: Troponin I: 0.03 ng/mL (ref 0.00–0.06)

## 2011-03-16 LAB — BASIC METABOLIC PANEL
Calcium: 8.5 mg/dL (ref 8.4–10.5)
GFR calc non Af Amer: >60 mL/min (ref >60)
Glucose, Bld: 121 mg/dL — ABNORMAL HIGH (ref 70–99)
Sodium: 140 mEq/L (ref 135–145)

## 2011-03-16 LAB — CARDIAC PANEL(CRET KIN+CKTOT+MB+TROPI): Total CK: 86 U/L (ref 7–177)

## 2011-03-17 LAB — CBC
HCT: 41 % (ref 36.0–46.0)
Hemoglobin: 12.8 g/dL (ref 12.0–15.0)
Hemoglobin: 14.2 g/dL (ref 12.0–15.0)
MCHC: 34.4 g/dL (ref 30.0–36.0)
MCHC: 34.8 g/dL (ref 30.0–36.0)
MCV: 95.1 fL (ref 78.0–100.0)
Platelets: 237 10*3/uL (ref 150–400)
RDW: 13.3 % (ref 11.5–15.5)
RDW: 13.3 % (ref 11.5–15.5)

## 2011-03-17 LAB — CARDIAC PANEL(CRET KIN+CKTOT+MB+TROPI)
CK, MB: 7.4 ng/mL — ABNORMAL HIGH (ref 0.3–4.0)
Total CK: 153 U/L (ref 7–177)
Total CK: 157 U/L (ref 7–177)
Troponin I: 4.06 ng/mL (ref 0.00–0.06)

## 2011-03-17 LAB — COMPREHENSIVE METABOLIC PANEL
ALT: 45 U/L — ABNORMAL HIGH (ref 0–35)
AST: 39 U/L — ABNORMAL HIGH (ref 0–37)
Albumin: 3.2 g/dL — ABNORMAL LOW (ref 3.5–5.2)
Alkaline Phosphatase: 81 U/L (ref 39–117)
BUN: 17 mg/dL (ref 6–23)
Calcium: 8.2 mg/dL — ABNORMAL LOW (ref 8.4–10.5)
Calcium: 8.6 mg/dL (ref 8.4–10.5)
Creatinine, Ser: 0.65 mg/dL (ref 0.4–1.2)
GFR calc Af Amer: >60 mL/min (ref >60)
Glucose, Bld: 112 mg/dL — ABNORMAL HIGH (ref 70–99)
Glucose, Bld: 120 mg/dL — ABNORMAL HIGH (ref 70–99)
Potassium: 4.3 mEq/L (ref 3.5–5.1)
Sodium: 136 mEq/L (ref 135–145)
Sodium: 138 mEq/L (ref 135–145)
Total Protein: 5.7 g/dL — ABNORMAL LOW (ref 6.0–8.3)
Total Protein: 6.4 g/dL (ref 6.0–8.3)

## 2011-03-17 LAB — POCT CARDIAC MARKERS
CKMB, poc: 4.1 ng/mL (ref 1.0–8.0)
Myoglobin, poc: 76.7 ng/mL (ref 12–200)
Troponin i, poc: 1.3 ng/mL (ref 0.00–0.09)

## 2011-03-17 LAB — CK TOTAL AND CKMB (NOT AT ARMC): Relative Index: 3.9 — ABNORMAL HIGH (ref 0.0–2.5)

## 2011-03-17 LAB — LIPASE, BLOOD: Lipase: 16 U/L (ref 11–59)

## 2011-03-17 LAB — LIPID PANEL
HDL: 40 mg/dL (ref >39)
Total CHOL/HDL Ratio: 5.8 RATIO
VLDL: 62 mg/dL — ABNORMAL HIGH (ref 0–40)

## 2011-03-17 LAB — BRAIN NATRIURETIC PEPTIDE: Pro B Natriuretic peptide (BNP): 252 pg/mL — ABNORMAL HIGH (ref 0.0–100.0)

## 2011-03-17 LAB — DIFFERENTIAL
Lymphocytes Relative: 23 % (ref 12–46)
Lymphs Abs: 1.8 10*3/uL (ref 0.7–4.0)
Monocytes Relative: 9 % (ref 3–12)
Neutro Abs: 5 10*3/uL (ref 1.7–7.7)
Neutrophils Relative %: 66 % (ref 43–77)

## 2011-03-17 LAB — TSH: TSH: 2.3 u[IU]/mL (ref 0.350–4.500)

## 2011-03-17 LAB — PROTIME-INR: INR: 0.9 (ref 0.00–1.49)

## 2011-03-17 LAB — TROPONIN I
Troponin I: 3.16 ng/mL (ref 0.00–0.06)
Troponin I: 3.22 ng/mL (ref 0.00–0.06)

## 2011-04-03 ENCOUNTER — Encounter: Payer: Self-pay | Admitting: Internal Medicine

## 2011-04-05 ENCOUNTER — Encounter: Payer: Self-pay | Admitting: Internal Medicine

## 2011-04-05 ENCOUNTER — Other Ambulatory Visit (INDEPENDENT_AMBULATORY_CARE_PROVIDER_SITE_OTHER): Payer: Medicare Other

## 2011-04-05 ENCOUNTER — Ambulatory Visit (INDEPENDENT_AMBULATORY_CARE_PROVIDER_SITE_OTHER): Payer: Medicare Other | Admitting: Internal Medicine

## 2011-04-05 ENCOUNTER — Other Ambulatory Visit (INDEPENDENT_AMBULATORY_CARE_PROVIDER_SITE_OTHER): Payer: Medicare Other | Admitting: Internal Medicine

## 2011-04-05 DIAGNOSIS — R7309 Other abnormal glucose: Secondary | ICD-10-CM

## 2011-04-05 DIAGNOSIS — E785 Hyperlipidemia, unspecified: Secondary | ICD-10-CM

## 2011-04-05 DIAGNOSIS — Z13220 Encounter for screening for lipoid disorders: Secondary | ICD-10-CM

## 2011-04-05 DIAGNOSIS — I2510 Atherosclerotic heart disease of native coronary artery without angina pectoris: Secondary | ICD-10-CM

## 2011-04-05 DIAGNOSIS — J4489 Other specified chronic obstructive pulmonary disease: Secondary | ICD-10-CM

## 2011-04-05 DIAGNOSIS — R1013 Epigastric pain: Secondary | ICD-10-CM

## 2011-04-05 DIAGNOSIS — J449 Chronic obstructive pulmonary disease, unspecified: Secondary | ICD-10-CM

## 2011-04-05 DIAGNOSIS — K3189 Other diseases of stomach and duodenum: Secondary | ICD-10-CM

## 2011-04-05 LAB — LIPID PANEL
Cholesterol: 219 mg/dL — ABNORMAL HIGH (ref 0–200)
HDL: 49.6 mg/dL (ref >39.00)
Triglycerides: 461 mg/dL — ABNORMAL HIGH (ref 0.0–149.0)

## 2011-04-05 LAB — LDL CHOLESTEROL, DIRECT: Direct LDL: 129 mg/dL

## 2011-04-05 LAB — HEMOGLOBIN A1C: Hgb A1c MFr Bld: 6.3 % (ref 4.6–6.5)

## 2011-04-05 MED ORDER — ALBUTEROL SULFATE HFA 108 (90 BASE) MCG/ACT IN AERS: 2.0000 | INHALATION_SPRAY | Freq: Four times a day (QID) | RESPIRATORY_TRACT | Status: DC | PRN

## 2011-04-05 MED ORDER — METOPROLOL SUCCINATE ER 50 MG PO TB24: 50.0000 mg | ORAL_TABLET | Freq: Every day | ORAL | Status: DC

## 2011-04-05 MED ORDER — FLUTICASONE-SALMETEROL 250-50 MCG/DOSE IN AEPB: 1.0000 | INHALATION_SPRAY | Freq: Two times a day (BID) | RESPIRATORY_TRACT | Status: DC

## 2011-04-05 MED ORDER — CLOPIDOGREL BISULFATE 75 MG PO TABS: 75.0000 mg | ORAL_TABLET | Freq: Every day | ORAL | Status: DC

## 2011-04-05 MED ORDER — BELLADONNA ALK-PHENOBARBITAL 48 MG PO TBCR: 1.0000 | EXTENDED_RELEASE_TABLET | Freq: Four times a day (QID) | ORAL | Status: DC | PRN

## 2011-04-05 NOTE — Assessment & Plan Note (Signed)
Diet controlled DM, previously controlled with weight loss Weight gain reviewed - check a1c now

## 2011-04-05 NOTE — Patient Instructions (Addendum)
It was good to see you today. Test(s) ordered today. Your results will be called to you after review (48-72hours after test completion). If any changes need to be made, you will be notified at that time. Medications reviewed, make following changes at this time: donnatal as needed - Your prescription has been given to you to submit to your pharmacy. Please take as directed and contact our office if you believe you are having problem(s) with the medication(s). Refill on medication(s) as discussed today. Please schedule followup in 4-6 months, call sooner if problems. Work on lifestyle changes as discussed (low fat, low carb diet; improved exercise efforts; weight loss) to control sugar, blood pressure and cholesterol levels and/or reduce risk of developing other medical problems.

## 2011-04-05 NOTE — Assessment & Plan Note (Signed)
On statin - hx CAD reviewed Check labs now -  continue present plan and medications.

## 2011-04-05 NOTE — Progress Notes (Signed)
Subjective:    Patient ID: Jenny [name], female    DOB: 12-13-1932, 77 y.o.   MRN: [id number]  HPI  Here for follow up - reviewed chronic medical issues:  hx GIB/DU 09/2009- hosp for UGIB related to DU with vv (s/p EGD - dr. hung - caut and clip) neg antibody for h. pylori so no abx assoc with ABL anemia - but no need for transfusion during hosp No longer taking PPI -no recurrent abd pain symptoms and no nausea vomitting or melena - no BRBPR- generally avoiding NSAIDs  CAD s/p MI 720/10 - no CP or anginal symptoms since stent to RCA followed by dr. b. Juanda Chance for same back on ASA/Plavix at dc from hosp despite recent UGI b/c DES was so new (<104mos at time of dx) myoview 03/31/10 negative  COPD/smoking - has been cutting back on smoking since MI - down to 4-6 cig/day - prev 2ppd has considered med asst but wants to cont as she is doing for now dyspnea with exertion but improved since starting advair exac for smoking include family stressors  dyslipidemia - reports improved compliance with ongoing medical treatment since 09/2010 and no changes in medication dose or frequency. denies adverse side effects related to current therapy.   DM2 - dx by a1c 05/2010 in setting of hyperglycemia and wt gain - has worked on diet and weight loss - highest 211# 03/2010 not checking cbgs - following low carb diet  Past Medical History  Diagnosis Date  . TOBACCO ABUSE 07/12/2009  . DYSPHAGIA UNSPECIFIED 02/05/2010  . CAD (coronary artery disease) 06/2009    DEs RCA with 70% LAD and EF 60%  . ALLERGIC RHINITIS CAUSE UNSPECIFIED   . ANEMIA-NOS   . COPD   . Diabetes mellitus 06/2010    Mild, diet controlled  . GLAUCOMA   . HYPERLIPIDEMIA   . HYPERTENSION, BENIGN   . ACUT DUOD ULCER W/HEMORR&PERF W/O MENTION OBST 10/05/2009  . MYOCARDIAL INFARCTION 06/2009    des to rca     Review of Systems  Constitutional: Negative for fever and fatigue.  Respiratory: Negative for cough.   Cardiovascular:  Negative for chest pain.  Neurological: Negative for headaches.       Objective:   Physical Exam  Constitutional: She is oriented to person, place, and time. She appears well-developed and well-nourished.  Cardiovascular: Normal rate, regular rhythm and normal heart sounds.   Pulmonary/Chest: Effort normal and breath sounds normal. No respiratory distress. She has no wheezes.  Neurological: She is alert and oriented to person, place, and time. No cranial nerve deficit.  Psychiatric: She has a normal mood and affect. Her behavior is normal. Judgment and thought content normal.   BP 142/72  Pulse 79  Temp(Src) 98.7 F (37.1 C) (Oral)  Ht 5\' 2"  (1.575 m)  Wt 212 lb 12.8 oz (96.525 kg)  BMI 38.92 kg/m2  SpO2 97% Lab Results  Component Value Date   WBC 7.9 09/13/2010   HGB 13.0 09/13/2010   HCT 38.7 09/13/2010   PLT 273.0 09/13/2010   CHOL 259* 09/13/2010   TRIG 452.0* 09/13/2010   HDL 47.80 09/13/2010   LDLDIRECT 160.6 09/13/2010   ALT 19 09/13/2010   AST 21 09/13/2010   NA 142 05/21/2010   K 4.5 05/21/2010   CL 105 05/21/2010   CREATININE 0.7 05/21/2010   BUN 16 05/21/2010   CO2 27 05/21/2010   TSH 1.34 05/21/2010   INR 1.07 10/06/2009   HGBA1C 6.6* 05/21/2010  Assessment & Plan:  See problem list. Medications and labs reviewed today.

## 2011-04-05 NOTE — Assessment & Plan Note (Signed)
Hx DU with GIB 09/2009 - no longer on PPI but regular h2b use Requests renewal of prior donnatal - rx done

## 2011-04-05 NOTE — Assessment & Plan Note (Signed)
Still smoking - encouraged consideration of cessation - no recent flares The current medical regimen is effective;  continue present plan and medications.

## 2011-04-05 NOTE — Assessment & Plan Note (Signed)
  Hx inferior MI treated with a drug-eluting stent to the RCA in July 2010. She's had no chest pain, compliant with meds, appears stable. The current medical regimen is effective;  continue present plan and medications. To see new cardiologist 09/2011 (replacement for BBrodie)

## 2011-04-07 ENCOUNTER — Telehealth: Payer: Self-pay | Admitting: Internal Medicine

## 2011-04-07 NOTE — Telephone Encounter (Signed)
Please call patient - normal or stable test results. No medication changes recommended. Thanks.  Lab Results  Component Value Date   HGBA1C 6.3 04/05/2011   HGBA1C 6.6* 05/21/2010   HGBA1C  Value: 6.0 (NOTE) The ADA recommends the following therapeutic goal for glycemic control related to Hgb A1c measurement: Goal of therapy: <6.5 Hgb A1c  Reference: American Diabetes Association: Clinical Practice Recommendations 2010, Diabetes Care, 2010, 33: (Suppl  1). 07/07/2009   Lab Results  Component Value Date   LDLCALC  Value: 128        Total Cholesterol/HDL:CHD Risk Coronary Heart Disease Risk Table                     Men   Women  1/2 Average Risk   3.4   3.3  Average Risk       5.0   4.4  2 X Average Risk   9.6   7.1  3 X Average Risk  23.4   11.0        Use the calculated Patient Ratio above and the CHD Risk Table to determine the patient's CHD Risk.        ATP III CLASSIFICATION (LDL):  <100     mg/dL   Optimal  161-096  mg/dL   Near or Above                    Optimal  130-159  mg/dL   Borderline  045-409  mg/dL   High  >811     mg/dL   Very High* 08/22/7828   CREATININE 0.7 05/21/2010

## 2011-04-08 ENCOUNTER — Telehealth: Payer: Self-pay | Admitting: *Deleted

## 2011-04-08 MED ORDER — BELLADONNA ALK-PHENOBARBITAL 16.2 MG PO TABS: 1.0000 | ORAL_TABLET | ORAL | Status: DC | PRN

## 2011-04-08 NOTE — Telephone Encounter (Signed)
MD agreed ok to change to plain. Fax info also called pharmacy spoke with Stewart/pharmacist. He states he went ahead and filled # 40. Does md want to give any refills since plain is not as strong as CR tablets. Can send new rx escipt if refills ok. Pls advise.Marland KitchenMarland Kitchen4/30/12@2 :25pm/LMB

## 2011-04-08 NOTE — Telephone Encounter (Signed)
Pt Notified with lab results.Marland KitchenMarland Kitchen4/30/12@12 :01pm/LMB

## 2011-04-08 NOTE — Telephone Encounter (Signed)
Changed med to plain donnatal, removed CR from med list - re: refills, pt will call if symptoms relieved with current med as we discussed at OV - if no help with this med, no refills will be needed - (no refills for now) - thanks

## 2011-04-08 NOTE — Telephone Encounter (Signed)
Pharmacy noted.Marland KitchenMarland Kitchen4/30/12@2 :53pm/LMB

## 2011-04-23 NOTE — Cardiovascular Report (Signed)
Jenny [name], Jenny [name]                 ACCOUNT NO.:  [id number]   MEDICAL RECORD NO.:  [id number]          PATIENT TYPE:  INP   LOCATION:  3732                         FACILITY:  [facility]   PHYSICIAN:  Everardo Beals. Juanda Chance, MD, FACCDATE OF BIRTH:  10/07/1933   DATE OF PROCEDURE:  07/08/2009  DATE OF DISCHARGE:                            CARDIAC CATHETERIZATION   CLINICAL HISTORY:  Jenny [name] is 77 years old and has no prior history of  heart disease and has been on no medicines.  She is a smoker.  She had  been having intermittent chest pain all week and came to the emergency  department.  She was pain free on arrival, and ECG showed what appeared  to be recent DMI with less than 1-mm inferior ST elevation.  She was not  felt to be having a STEMI at that time and was felt to possibly have  completed her infarct.  She developed recurrent chest pain at 11:00 p.m.  and repeat ECG showed 2-mm inferior ST elevation.  She was seen by Dr.  Lalla Brothers, our cardiology fellow and transferred to the Cath Lab after a  code STEMI was called.   PROCEDURE:  The procedure was performed via the right femoral artery, an  arterial sheath, and 6-French preformed coronary catheters.  A front  wall arterial puncture was performed and Omnipaque contrast was used.  The patient had been given Integralin in the emergency department, so  she was given a second bolus of Integralin here.  Initial ACT was 225.  After taking the diagnostic left coronary pictures, we went in with a JR-  4 6-French guiding catheter with side holes.  The right coronary artery  had a 99% stenosis with TIMI 2 flow.  We passed a Prow[place] wire down the  vessel without difficulty.  We predilated with a 2.25 x 20 mm Apex  balloon performing 2 inflations up to 10 atmospheres for 30 seconds.  We  then deployed a 2.5 x 23 mm Xience stent deploying this with 1 inflation  of 14 atmospheres for 30 seconds.  We then postdilated with a 3.25 x 20  mm [place] Apex  performing 2 inflation of 18 atmospheres for 30 seconds.  Final diagnostic studies were then performed through the guiding  catheter.  The left ventriculogram was performed at the end of the  procedure.  The right femoral artery was not suitable for closure at the  end of procedure because of a stick at the bifurcation.   The patient had been given 600 mg of Plavix just prior to the  intervention.  She had been given chewable aspirin at [facility].   RESULTS:  1. The left main coronary artery was free of significant disease.  2. The left anterior descending artery gave rise to 2 septal      perforators, a diagonal branch, and another septal perforator.  The      LAD was diffusely irregular.  There was 70% narrowing in the      proximal vessel and 70% narrowing in the midvessel.  There is also  70% narrowing in the first diagonal branch.  3. The circumflex artery gave rise to a marginal branch, second      smaller marginal branch, and 2 posterolateral branches.  There was      40% narrowing in the midvessel, and there was 50% narrowing in the      mid-to-distal vessel and 50% narrowing in the distal vessel.  4. The right coronary artery is a small-to-moderate-sized vessel, gave      rise to a conus branch, a right ventricular branch, a posterior      ascending branch, and a very small posterolateral branch.  There      was a 99% stenosis in the proximal vessel with TIMI 2 flow      distally.  There was very small and grade 2 thrombus.  Following      stenting of the lesion in the proximal right coronary artery,      stenosis improved from 99% to 0% and the flow improved from TIMI 2      to TIMI 3 flow.  5. The left ventriculogram performed on the RAO projection showed      hypokinesis of the inferobasal segment.  The overall wall motion      was good with an estimated ejection fraction of 60%.   HEMODYNAMIC DATA:  1. The aortic pressure was 161/87 with a mean of 116.  2. The  left ventricular pressure is 161/30.   CONCLUSION:  1. Acute diaphragmatic wall myocardial infarction with 99% stenosis in      the proximal right coronary artery with TIMI 2 flow distally, 70%      narrowing in the proximal, and 70% narrowing in the mid LAD with      70% narrowing in the first diagonal branch, 40% mid and 50% distal      stenosis in the circumflex artery and inferobasal wall hypokinesis      with an estimated ejection fraction of 60%.  2. Successful PCI of the lesion in the proximal right coronary artery      using a Xience drug-eluting stent with improvement in the central      narrowing from 99% to 0% and improvement in the flow from TIMI 2 to      TIMI 3 flow.   The patient had the onset of chest pain that led to the diagnostic ECG  at 2300.  The diagnostic ECG was at 2302.  She arrived in the Cath Lab  at 2356.  The first balloon inflation was at 12:20 a.m.  This gave a  door-to-balloon time of 80 minutes and a reperfusion time of 80 minutes.   DISPOSITION:  The patient returned to post angio room for further  observation.  I think the rest of her disease can be managed medically.  Probably target discharge on day #2.      Bruce Elvera Lennox Juanda Chance, MD, [facility]  Electronically Signed     BRB/MEDQ  D:  07/08/2009  T:  07/08/2009  Job:  [id number]   cc:   [facility]

## 2011-04-23 NOTE — Discharge Summary (Signed)
NAMEBATOUL, LIMES                 ACCOUNT NO.:  [id number]   MEDICAL RECORD NO.:  [id number]          PATIENT TYPE:  INP   LOCATION:  3732                         FACILITY:  [facility]   PHYSICIAN:  Everardo Beals. Juanda Chance, MD, FACCDATE OF BIRTH:  [birth year]   DATE OF ADMISSION:  07/07/2009  DATE OF DISCHARGE:  07/11/2009                               DISCHARGE SUMMARY   PROCEDURES:  1. Cardiac catheterization.  2. Coronary arteriogram.  3. Left ventriculogram.  4. Percutaneous transluminal coronary angioplasty and drug-eluting      stent to the proximal right coronary artery.   PRIMARY/FINAL DISCHARGE DIAGNOSIS:  ST-elevation myocardial infarction.   SECONDARY DIAGNOSES:  1. Hyperlipidemia with a total cholesterol of 230, triglycerides 312,      HDL 40, LDL 128 this admission.  2. Hyperglycemia with a hemoglobin A1c of 6.0 and fasting blood sugars      between 112 and 121.  3. Ongoing tobacco abuse.  4. History of stomach ulcers.  5. History of right knee surgery after an injury.  6. History of a fractured left wrist, not repaired.  7. Allergy or intolerance to DEMEROL.   TIME AT DISCHARGE:  43 minutes.   HOSPITAL COURSE:  Jenny [name] is a 77 year old female with no previous  history of coronary artery disease.  She had chest pain and went to [facility] where she was given aspirin.  She was transferred  to [facility] for further evaluation and treatment.  She had recurrent  pain with ST elevation and was taken urgently to the Cath Lab.   The cardiac catheterization showed 70% LAD, 50% circumflex, and a 99%  proximal RCA that was treated with a drug-eluting stent reducing the  stenosis to zero.  She had a 40% stenosis in the PDA, and her left  ventricle had inferobasal hypokinesis.  An echocardiogram performed  subsequently showed an EF of 60% with a PAS of 36.   Ms. Pask was seen by Smoking Cessation.  She had a beta-blocker and a  statin added to her medication  regimen.  She had a right groin hematoma  that was checked and was found to be mostly hematoma, but there was a  small pseudoaneurysm.  Dr. Juanda Chance felt the best option was to watch this  and allow it to close on its own as it was only 1.17 x 1.8 cm.  A  recheck is ordered as an outpatient.   Ms. Stanke was seen by Cardiac Rehab on Apr 10, 2009, and ambulated  without chest pain or shortness of breath.  She was seen by Dr. Juanda Chance  who considered her stable for discharge with close outpatient followup.   DISCHARGE INSTRUCTIONS:  Her activity level is to be increased slowly  with no lifting for 2 weeks and no driving for 2 days.  She is to call  our office for problems with the cath site.  She is not to use tobacco.  She is to  follow up with Wende Bushy, PA-C, for Dr. Juanda Chance on July 19, 2009,  right groin ultrasound at 9:00  a.m. and an office visit at 10:30.  She  is to follow up with Dr. Juanda Chance on August 08, 2009, at 11:45.  She is to  follow up at [facility] as needed.  Discharge  medications are per the computerized discharge med list.      Theodore Demark, PA-C      Bruce R. Juanda Chance, MD, [facility]  Electronically Signed    RB/MEDQ  D:  07/11/2009  T:  07/12/2009  Job:  [id number]   cc:   [facility]

## 2011-04-23 NOTE — H&P (Signed)
NAME:  Jenny [name], RIZOR NO.:  [id number]   MEDICAL RECORD NO.:  [id number]          PATIENT TYPE:  EMS   LOCATION:  [facility]                         FACILITY:  [facility]   PHYSICIAN:  Christell Faith, MD   DATE OF BIRTH:  1933/11/05   DATE OF ADMISSION:  07/07/2009  DATE OF DISCHARGE:                              HISTORY & PHYSICAL   This is a new cardiology patient admitted to Dr. Charlies Constable with  [facility].   PRIMARY CARE PHYSICIAN:  [facility]   CHIEF COMPLAINT:  Chest pain and shortness of breath.   HISTORY OF PRESENT ILLNESS:  This is a 77 year old white female, who  developed chest pain one week ago after mopping the floor.  It became  much worse 5 days ago after eating.  She initially thought it was  heartburn; however, the pain is described as diffuse chest tingling and  tightness radiating down both arms and up to the neck and also between  the shoulder blades.  The pain has persisted over the past week.  It is  worse with any exertion.  In addition, any exertion leads to profound  shortness of breath.  This is all new over the past week.  Previously,  she has been very active.  She finally came to the emergency department  tonight because she was tired of being short of breath and hurting.  She is presently pain free and resting comfortably in the bed.   PAST MEDICAL HISTORY:  1. Stomach ulcers.  2. Right knee crush injury treated with open reduction internal      fixation.  3. Fractured left wrist, which has not been surgically repaired.   SOCIAL HISTORY:  She lives in [place] with her significant other.  She is retired from [employer].  She has 5 children, has a 60-  pack-year smoking history including presently smoking a half pack a day,  rarely has alcohol.   FAMILY HISTORY:  Mother is deceased.  She had a history of stomach  ulcers.  Father is deceased of stomach cancer.  The patient has one half  brother, whose health is unknown.   ALLERGIES:  DEMEROL.   MEDICINES:  Pepcid, Maalox, chromium, bilberry, chondroitin, oil of  oregano, yeast detox, and p.r.n. Aleve.   REVIEW OF SYSTEMS:  Positive for chest pain, shortness of breath,  dyspnea on exertion, orthopnea, nausea, and acid reflux, also  osteoarthritis.  Otherwise, review of systems negative.   PHYSICAL EXAMINATION:  VITAL SIGNS:  Temperature 98.4, pulse 82,  respiratory rate 16, blood pressure 154/79, saturation 95% on 2 L.  GENERAL:  This is a pleasant white female, resting comfortably in the  bed.  She is in no acute distress.  EYES:  Pupils are round and reactive.  Extraocular movements are intact.  Sclerae are clear.  NECK:  Supple.  Neck veins are flat.  No carotid bruits.  CARDIAC:  Heart tones are distant.  There are no obvious murmurs, 2+  radial and dorsalis pedis pulses bilaterally.  LUNGS:  Diminished breath sounds diffusely  but no wheezing and no rales.  ABDOMEN:  Soft, nontender, nondistended.  EXTREMITIES:  No edema, no rash.  NEUROLOGIC:  5/5 strength in all 4 extremities.  Facial expressions are  symmetric and normal.   DIAGNOSTIC TESTS:  Chest x-ray shows mild bronchitis.  EKG shows sinus  rhythm 71 beats per minute with evidence of recent inferior Q-wave  myocardial infarction.  The T waves have turned over, and there is  minimal residual ST elevation.   LABORATORY DATA:  White blood cell 7.6, hemoglobin 14.2, platelets 256.  Sodium 136, potassium 3.7, creatinine 0.7, point-of-care CK-MB 4.1,  point-of-care troponin 1.3.   IMPRESSION:  77 year old white female with late presenting inferior Q-  wave myocardial infarction.  She is presently Killip class I-II.  She is  presently pain free but has ongoing dyspnea with exertion.   PLAN:  1. Admit to the coronary care unit.  We will check formal cardiac      enzymes and cycle EKGs.  She will be initiated on aspirin,      unfractionated heparin, and  Integrilin.  She has been having some      intermittent chest pain as recently as this morning.  She is      presently refusing cardiac catheterization; however, there will be      an ongoing discussion about this issue.  Tentative plan for cath      Monday, but sooner if needed.  2. Initiate Lopressor 12.5 mg t.i.d. and up titrate as rapidly as      possible.  3. We will check BNP and echocardiogram for LV assessment.  4. Check fasting lipid panel and initiate Zocor 40 mg daily.  5. For her history of ulcers - since we will be using blood thinners,      we will start her on p.o. Protonix.  6. Tobacco cessation.      Christell Faith, MD  Electronically Signed     NDL/MEDQ  D:  07/07/2009  T:  07/08/2009  Job:  [id number]

## 2011-05-15 ENCOUNTER — Ambulatory Visit (INDEPENDENT_AMBULATORY_CARE_PROVIDER_SITE_OTHER): Payer: Medicare Other | Admitting: Internal Medicine

## 2011-05-15 ENCOUNTER — Encounter: Payer: Self-pay | Admitting: Internal Medicine

## 2011-05-15 VITALS — BP 146/80 | HR 71 | Temp 98.5°F | Ht 62.0 in | Wt 211.4 lb

## 2011-05-15 DIAGNOSIS — J019 Acute sinusitis, unspecified: Secondary | ICD-10-CM

## 2011-05-15 MED ORDER — AMPICILLIN 500 MG PO CAPS: 500.0000 mg | ORAL_CAPSULE | Freq: Three times a day (TID) | ORAL | Status: DC

## 2011-05-15 NOTE — Patient Instructions (Signed)
It was good to see you today. Ampicillin antibiotics - Your prescription(s) have been submitted to your pharmacy. Please take as directed and contact our office if you believe you are having problem(s) with the medication(s). Continue allergy and sinus mediatations as before Call if continued wheeze or other breathing problems, sooner if symptoms worse

## 2011-05-15 NOTE — Progress Notes (Signed)
  Subjective:     Jenny [name] is a 77 y.o. female who presents for evaluation of sinus pain. Symptoms include: congestion, cough, facial pain and headaches. Onset of symptoms was 5 days ago. Symptoms have been gradually worsening since that time. Past history is significant for frequent episodes of bronchitis and COPD. Patient is a smoker  (0.5 ppd x 60 yrs).  The following portions of the patient's history were reviewed and updated as appropriate: allergies, current medications, past family history, past medical history, past social history, past surgical history and problem list.  Review of Systems Constitutional: negative for fevers Ears, nose, mouth, throat, and face: negative for earaches, epistaxis, facial trauma and sore throat Respiratory: positive for chronic bronchitis and cough Cardiovascular: negative for chest pain   Objective:    BP 146/80  Pulse 71  Temp(Src) 98.5 F (36.9 C) (Oral)  Ht 5\' 2"  (1.575 m)  Wt 211 lb 6.4 oz (95.89 kg)  BMI 38.67 kg/m2  SpO2 95% General appearance: alert and no distress Ears: normal TM's and external ear canals both ears Throat: abnormal findings: moderate oropharyngeal erythema and posterior lymphoid hyperplasia Lungs: wheezes bilaterally Heart: regular rate and rhythm, S1, S2 normal, no murmur, click, rub or gallop    Assessment:    Acute bacterial sinusitis.    Plan:    Nasal saline sprays. Nasal steroids per medication orders. Ampicillin per medication orders.

## 2011-05-29 ENCOUNTER — Ambulatory Visit (INDEPENDENT_AMBULATORY_CARE_PROVIDER_SITE_OTHER): Payer: Medicare Other | Admitting: Internal Medicine

## 2011-05-29 ENCOUNTER — Encounter: Payer: Self-pay | Admitting: Internal Medicine

## 2011-05-29 VITALS — BP 124/70 | HR 69 | Temp 98.7°F | Ht 62.0 in | Wt 211.6 lb

## 2011-05-29 DIAGNOSIS — J309 Allergic rhinitis, unspecified: Secondary | ICD-10-CM

## 2011-05-29 MED ORDER — FEXOFENADINE HCL 180 MG PO TABS: 180.0000 mg | ORAL_TABLET | Freq: Every day | ORAL | Status: DC

## 2011-05-29 MED ORDER — BELLADONNA ALK-PHENOBARBITAL 16.2 MG PO TABS: 1.0000 | ORAL_TABLET | ORAL | Status: DC | PRN

## 2011-05-29 MED ORDER — AZELASTINE HCL 0.1 % NA SOLN: 2.0000 | Freq: Two times a day (BID) | NASAL | Status: DC

## 2011-05-29 NOTE — Patient Instructions (Signed)
It was good to see you today. Try allegra or claritin in place of zyrtec Astelin nose spray of pills not helpful - Your prescription(s) have been submitted to your pharmacy. Please take as directed and contact our office if you believe you are having problem(s) with the medication(s). Refill on medication(s) as discussed today. Keep follow up and planned, call sooner if problems

## 2011-05-29 NOTE — Assessment & Plan Note (Signed)
Recent flare with sinusitis resolved s/p abx tx - Change oral antihist and encouraged compliance with nasal steroid - mech of action explained Also use astelin nasal spray - erx done

## 2011-05-29 NOTE — Progress Notes (Signed)
Subjective:    Patient ID: Jenny [name], female    DOB: 10-29-1933, 77 y.o.   MRN: [id number]  HPI Seen 2 weeks ago for sinusitis - improved with augmentin - Now increase allergy symptoms - no fever or face pain, no discolored nasal dc inconstent nasal steroid use due to cataract concerns with steroids  Also reviewed chronic med issues:  hx GIB/DU 09/2009- hosp for UGIB related to DU with vv (s/p EGD - dr. hung - caut and clip) neg antibody for h. pylori so no abx assoc with ABL anemia - but no need for transfusion during hosp No longer taking PPI -no recurrent abd pain symptoms and no nausea vomitting or melena - no BRBPR- generally avoiding NSAIDs  CAD s/p MI 720/10 - no CP or anginal symptoms since stent to RCA followed by dr. b. Juanda Chance for same back on ASA/Plavix at dc from hosp despite recent UGI b/c DES was so new (<54mos at time of dx) myoview 03/31/10 negative  COPD/smoking - has been cutting back on smoking since MI - down to 4-6 cig/day - prev 2ppd has considered med asst but wants to cont as she is doing for now dyspnea with exertion but improved since starting advair exac for smoking include family stressors  dyslipidemia - reports improved compliance with ongoing medical treatment since 09/2010 and no changes in medication dose or frequency. denies adverse side effects related to current therapy.   DM2 - dx by a1c 05/2010 in setting of hyperglycemia and wt gain - has worked on diet and weight loss - highest 211# 03/2010 not checking cbgs - following low carb diet  Past Medical History  Diagnosis Date  . TOBACCO ABUSE   . CAD (coronary artery disease) 06/2009    DEs RCA with 70% LAD and EF 60%  . ALLERGIC RHINITIS CAUSE UNSPECIFIED   . ANEMIA-NOS   . GLAUCOMA   . HYPERLIPIDEMIA   . HYPERTENSION, BENIGN   . MYOCARDIAL INFARCTION 06/2009    des to rca  . COPD     mild obst on PFTs 03/2010  . Diabetes mellitus 06/2010 dx    Mild, diet controlled  . ACUT DUOD ULCER  W/HEMORR&PERF W/O MENTION OBST 10/05/2009    NSAID induced    Review of Systems  HENT: Negative for facial swelling.   Respiratory: Negative for wheezing.   Neurological: Negative for headaches.       Objective:   Physical Exam BP 124/70  Pulse 69  Temp(Src) 98.7 F (37.1 C) (Oral)  Ht 5\' 2"  (1.575 m)  Wt 211 lb 9.6 oz (95.981 kg)  BMI 38.70 kg/m2  SpO2 98% Physical Exam  Constitutional: She is oriented to person, place, and time. She appears well-developed and well-nourished. No distress.  HENT: Head: Normocephalic and atraumatic. Ears; B TMs ok, no erythema or effusion; Nose: Nose normal.  Mouth/Throat: Oropharynx is clear and moist. No oropharyngeal exudate.  Eyes: Conjunctivae and EOM are normal. Pupils are equal, round, and reactive to light. No scleral icterus.  Neck: Normal range of motion. Neck supple. No JVD present. No thyromegaly present.  Cardiovascular: Normal rate, regular rhythm and normal heart sounds.  No murmur heard. No BLE edema. Pulmonary/Chest: Effort normal and breath sounds normal. No respiratory distress. She has no wheezes. Psychiatric: She has a normal mood and affect. Her behavior is normal. Judgment and thought content normal.       Lab Results  Component Value Date   WBC 7.9 09/13/2010   HGB  13.0 09/13/2010   HCT 38.7 09/13/2010   PLT 273.0 09/13/2010   CHOL 219* 04/05/2011   TRIG 461.0* 04/05/2011   HDL 49.60 04/05/2011   LDLDIRECT 129.0 04/05/2011   ALT 19 09/13/2010   AST 21 09/13/2010   NA 142 05/21/2010   K 4.5 05/21/2010   CL 105 05/21/2010   CREATININE 0.7 05/21/2010   BUN 16 05/21/2010   CO2 27 05/21/2010   TSH 1.34 05/21/2010   INR 1.07 10/06/2009   HGBA1C 6.3 04/05/2011     Assessment & Plan:  See problem list. Medications and labs reviewed today.

## 2011-06-07 ENCOUNTER — Ambulatory Visit (INDEPENDENT_AMBULATORY_CARE_PROVIDER_SITE_OTHER): Payer: Medicare Other | Admitting: Internal Medicine

## 2011-06-07 ENCOUNTER — Encounter: Payer: Self-pay | Admitting: Internal Medicine

## 2011-06-07 VITALS — BP 120/74 | HR 73 | Temp 99.2°F | Ht 66.0 in

## 2011-06-07 DIAGNOSIS — K044 Acute apical periodontitis of pulpal origin: Secondary | ICD-10-CM

## 2011-06-07 DIAGNOSIS — K047 Periapical abscess without sinus: Secondary | ICD-10-CM

## 2011-06-07 DIAGNOSIS — R6884 Jaw pain: Secondary | ICD-10-CM

## 2011-06-07 MED ORDER — PENICILLIN V POTASSIUM 500 MG PO TABS: 500.0000 mg | ORAL_TABLET | Freq: Four times a day (QID) | ORAL | Status: AC

## 2011-06-07 NOTE — Patient Instructions (Signed)
It was good to see you today. Penicillin for mouth infection and soreness - Your prescription(s) have been submitted to your pharmacy. Please take as directed and contact our office if you believe you are having problem(s) with the medication(s). Continue mouth rinse as ongoing If continued problems despite treatment, call dentist as discussed

## 2011-06-07 NOTE — Progress Notes (Signed)
  Subjective:    Patient ID: Jenny [name], female    DOB: 12-24-1932, 77 y.o.   MRN: [id number]  HPI  complains of right mouth/jaw and face pain Onset 4 days ago, progressively worse ?mild swelling over lower jaw - Pain worse with chewing No ear pain or fever, no headache - no pain with swallow Denies trauma -  no teeth at sore area on lower right jaw and upper plate fitting without change - no pain  Past Medical History  Diagnosis Date  . TOBACCO ABUSE   . CAD (coronary artery disease) 06/2009    DEs RCA with 70% LAD and EF 60%  . ALLERGIC RHINITIS CAUSE UNSPECIFIED   . ANEMIA-NOS   . GLAUCOMA   . HYPERLIPIDEMIA   . HYPERTENSION, BENIGN   . MYOCARDIAL INFARCTION 06/2009    des to rca  . COPD     mild obst on PFTs 03/2010  . Diabetes mellitus 06/2010 dx    Mild, diet controlled  . ACUT DUOD ULCER W/HEMORR&PERF W/O MENTION OBST 10/05/2009    NSAID induced    Review of Systems  HENT: Negative for neck pain, neck stiffness, tinnitus and ear discharge.   Respiratory: Negative for shortness of breath.   Cardiovascular: Negative for chest pain.       Objective:   Physical Exam BP 120/74  Pulse 73  Temp(Src) 99.2 F (37.3 C) (Oral)  Ht 5\' 6"  (1.676 m)  SpO2 95% Physical Exam  Constitutional: She is oriented to person, place, and time. She appears well-developed and well-nourished. No distress.  HENT: Head: Normocephalic and atraumatic. Ears; B TMs ok, no erythema or effusion; Nose: Nose normal.  Mouth/Throat: Oropharynx is clear and moist, no thrush. No oropharyngeal exudate. R lower jaw gum very red and swollen, no drainage or ulcer Neck: Normal range of motion. Neck supple. No JVD present. No thyromegaly present.  Cardiovascular: Normal rate, regular rhythm and normal heart sounds.  No murmur heard. No BLE edema. Pulmonary/Chest: Effort normal and breath sounds normal. No respiratory distress. She has no wheezes. Psychiatric: She has a normal mood and affect. Her  behavior is normal. Judgment and thought content normal.       Assessment & Plan:  R mouth pain - suspect dental infection based on gum inflammation - LGF and Ears benign - treatment antibiotics with PCN and mouth rinse - pt advised to call dentist of symptoms unimproved with treatment

## 2011-06-13 ENCOUNTER — Other Ambulatory Visit: Payer: Self-pay | Admitting: *Deleted

## 2011-06-13 MED ORDER — SIMVASTATIN 40 MG PO TABS: 40.0000 mg | ORAL_TABLET | Freq: Every day | ORAL | Status: DC

## 2011-07-25 ENCOUNTER — Ambulatory Visit (INDEPENDENT_AMBULATORY_CARE_PROVIDER_SITE_OTHER)
Admission: RE | Admit: 2011-07-25 | Discharge: 2011-07-25 | Disposition: A | Discharge status: Home / Self Care | Payer: Medicare Other | Source: Ambulatory Visit | Attending: Internal Medicine | Admitting: Internal Medicine

## 2011-07-25 ENCOUNTER — Ambulatory Visit (INDEPENDENT_AMBULATORY_CARE_PROVIDER_SITE_OTHER): Payer: Medicare Other | Admitting: Internal Medicine

## 2011-07-25 ENCOUNTER — Encounter: Payer: Self-pay | Admitting: Internal Medicine

## 2011-07-25 VITALS — BP 132/78 | HR 64 | Temp 98.2°F | Ht 62.0 in

## 2011-07-25 DIAGNOSIS — M25532 Pain in left wrist: Secondary | ICD-10-CM

## 2011-07-25 DIAGNOSIS — M25539 Pain in unspecified wrist: Secondary | ICD-10-CM

## 2011-07-25 NOTE — Patient Instructions (Signed)
It was good to see you today. xray ordered today. Your results will be called to you after review. If any changes need to be made, you will be notified at that time. Continue wrist splint as you are doing, ice 3x/day as needed for next 5-7 days and tylenol as needed

## 2011-07-25 NOTE — Progress Notes (Signed)
  Subjective:    Patient ID: Jenny [name], female    DOB: 1933-11-23, 77 y.o.   MRN: [id number]  HPI complains of left wrist pain Onset 10pm yesterday Precipitated by accidental fall - landed on outstretched wrist Immediate pain at base of thumb Prior fx same wrist 03/2009 and 1995 Pain improved with tylenol and immobilization (OTC carpal tunnel splint)  Past Medical History  Diagnosis Date  . TOBACCO ABUSE   . CAD (coronary artery disease) 06/2009    DEs RCA with 70% LAD and EF 60%  . ANEMIA-NOS   . GLAUCOMA   . HYPERLIPIDEMIA   . HYPERTENSION, BENIGN   . COPD     mild obst on PFTs 03/2010  . Diabetes mellitus 06/2010 dx    Mild, diet controlled  . ACUT DUOD ULCER W/HEMORR&PERF W/O MENTION OBST 10/05/2009    NSAID induced  . MYOCARDIAL INFARCTION 06/2009    des to rca  . ALLERGIC RHINITIS CAUSE UNSPECIFIED     Review of Systems  Constitutional: Negative for fever.  Musculoskeletal: Negative for back pain.  Neurological: Negative for syncope.       Objective:   Physical Exam BP 132/78  Pulse 64  Temp(Src) 98.2 F (36.8 C) (Oral)  Ht 5\' 2"  (1.575 m)  SpO2 98%  Gen: NAD Lung: mod air movement (baseline), no w/c or inc wob CV: RRR Mskel: L wrist - obvious deformity (chronic) but min swelling - pain to palpation at base of thumb - chronic limited flexion at; nonpainful full supination and pronation; good extension.     Assessment & Plan:  L wrist pain - check xray rule out new fx; continue tylenol prn (declines need for narcotics) and continue splint Consider PT and or ortho eval depending on xray and progress

## 2011-08-07 ENCOUNTER — Ambulatory Visit (INDEPENDENT_AMBULATORY_CARE_PROVIDER_SITE_OTHER): Payer: Medicare Other | Admitting: Internal Medicine

## 2011-08-07 ENCOUNTER — Encounter: Payer: Self-pay | Admitting: Internal Medicine

## 2011-08-07 ENCOUNTER — Other Ambulatory Visit (INDEPENDENT_AMBULATORY_CARE_PROVIDER_SITE_OTHER): Payer: Medicare Other

## 2011-08-07 DIAGNOSIS — J449 Chronic obstructive pulmonary disease, unspecified: Secondary | ICD-10-CM

## 2011-08-07 DIAGNOSIS — J4489 Other specified chronic obstructive pulmonary disease: Secondary | ICD-10-CM

## 2011-08-07 DIAGNOSIS — I10 Essential (primary) hypertension: Secondary | ICD-10-CM

## 2011-08-07 DIAGNOSIS — E785 Hyperlipidemia, unspecified: Secondary | ICD-10-CM

## 2011-08-07 DIAGNOSIS — E119 Type 2 diabetes mellitus without complications: Secondary | ICD-10-CM

## 2011-08-07 LAB — HEMOGLOBIN A1C: Hgb A1c MFr Bld: 6.2 % (ref 4.6–6.5)

## 2011-08-07 NOTE — Assessment & Plan Note (Signed)
Still smoking - encouraged consideration of cessation - no recent flares The current medical regimen is effective;  continue present plan and medications.

## 2011-08-07 NOTE — Assessment & Plan Note (Signed)
Diet controlled DM, previously controlled with weight loss Weight gain reviewed - check a1c now  Lab Results  Component Value Date   HGBA1C 6.3 04/05/2011

## 2011-08-07 NOTE — Progress Notes (Signed)
Subjective:    Patient ID: Jenny [name], female    DOB: [birth year], 77 y.o.   MRN: [id number]  HPI   Here for follow up - reviewed chronic medical issues:  hx GIB/DU 09/2009- hosp for UGIB related to DU with vv (s/p EGD - dr. hung - caut and clip) neg antibody for h. pylori so no abx assoc with ABL anemia - but no need for transfusion during hosp No longer taking PPI -no recurrent abd pain symptoms and no nausea vomitting or melena - no BRBPR- generally avoiding NSAIDs  CAD s/p MI 720/10 - no CP or anginal symptoms since stent to RCA followed by dr. b. Juanda Chance for same back on ASA/Plavix at dc from hosp despite recent UGI b/c DES was so new (<33mos at time of dx) myoview 03/31/10 negative  COPD/smoking - has been cutting back on smoking since MI - down to 4-6 cig/day - prev 2ppd has considered med asst but wants to cont as she is doing for now dyspnea with exertion but improved since starting advair exac for smoking include family stressors  dyslipidemia - reports improved compliance with ongoing medical treatment since 09/2010 and no changes in medication dose or frequency. denies adverse side effects related to current therapy.   DM2 - dx by a1c 05/2010 in setting of hyperglycemia and wt gain - has worked on diet and weight loss - highest 211# 03/2010 not checking cbgs - following low carb diet  Past Medical History  Diagnosis Date  . TOBACCO ABUSE   . CAD (coronary artery disease) 06/2009    DEs RCA with 70% LAD and EF 60%  . ANEMIA-NOS   . GLAUCOMA   . HYPERLIPIDEMIA   . HYPERTENSION, BENIGN   . COPD     mild obst on PFTs 03/2010  . Diabetes mellitus 06/2010 dx    Mild, diet controlled  . ACUT DUOD ULCER W/HEMORR&PERF W/O MENTION OBST 10/05/2009    NSAID induced  . MYOCARDIAL INFARCTION 06/2009    des to rca  . ALLERGIC RHINITIS CAUSE UNSPECIFIED      Review of Systems  Constitutional: Negative for fever and fatigue.  Respiratory: Negative for cough.     Cardiovascular: Negative for chest pain.  Neurological: Negative for headaches.       Objective:   Physical Exam   BP 152/80  Pulse 68  Temp(Src) 98.6 F (37 C) (Oral)  Ht 5\' 2"  (1.575 m)  Wt 211 lb 9.6 oz (95.981 kg)  BMI 38.70 kg/m2  SpO2 97% Constitutional: She is overweight. She appears well-developed and well-nourished. No distress.  Neck: Normal range of motion. Neck supple. No JVD present. No thyromegaly present.  Cardiovascular: Normal rate, regular rhythm and normal heart sounds.  No murmur heard. No BLE edema. Pulmonary/Chest: Effort normal and breath sounds normal. No respiratory distress. She has no wheezes. Psychiatric: She has a normal mood and affect. Her behavior is normal. Judgment and thought content normal.    Lab Results  Component Value Date   WBC 7.9 09/13/2010   HGB 13.0 09/13/2010   HCT 38.7 09/13/2010   PLT 273.0 09/13/2010   CHOL 219* 04/05/2011   TRIG 461.0* 04/05/2011   HDL 49.60 04/05/2011   LDLDIRECT 129.0 04/05/2011   ALT 19 09/13/2010   AST 21 09/13/2010   NA 142 05/21/2010   K 4.5 05/21/2010   CL 105 05/21/2010   CREATININE 0.7 05/21/2010   BUN 16 05/21/2010   CO2 27 05/21/2010   TSH 1.34  05/21/2010   INR 1.07 10/06/2009   HGBA1C 6.3 04/05/2011       Assessment & Plan:  See problem list. Medications and labs reviewed today.

## 2011-08-07 NOTE — Patient Instructions (Signed)
It was good to see you today. Test(s) ordered today. Your results will be called to you after review (48-72hours after test completion). If any changes need to be made, you will be notified at that time. Watch blood pressure at home and call if SBP>140s for medication adjustments as needed Medications reviewed, no changes at this time. Please schedule followup in 4-6 months for diabetes mellitus and blood pressure check, call sooner if problems.

## 2011-08-07 NOTE — Assessment & Plan Note (Signed)
On statin - hx CAD reviewed Last lipids reviewed -  continue present plan and medications.   Lab Results  Component Value Date   LDLCALC  Value: 128        Total Cholesterol/HDL:CHD Risk Coronary Heart Disease Risk Table                     Men   Women  1/2 Average Risk   3.4   3.3  Average Risk       5.0   4.4  2 X Average Risk   9.6   7.1  3 X Average Risk  23.4   11.0        Use the calculated Patient Ratio above and the CHD Risk Table to determine the patient's CHD Risk.        ATP III CLASSIFICATION (LDL):  <100     mg/dL   Optimal  161-096  mg/dL   Near or Above                    Optimal  130-159  mg/dL   Borderline  045-409  mg/dL   High  >811     mg/dL   Very High* 08/22/7828

## 2011-08-07 NOTE — Assessment & Plan Note (Signed)
Elevated BP today but usually well controlled so will defer med changes at this time Pt will monitor at home and call if SBP>140 for med adjustments as needed  BP Readings from Last 3 Encounters:  08/07/11 152/80  07/25/11 132/78  06/07/11 120/74

## 2011-08-16 ENCOUNTER — Ambulatory Visit (INDEPENDENT_AMBULATORY_CARE_PROVIDER_SITE_OTHER): Payer: Medicare Other | Admitting: Internal Medicine

## 2011-08-16 ENCOUNTER — Encounter: Payer: Self-pay | Admitting: Internal Medicine

## 2011-08-16 VITALS — BP 172/82 | HR 88 | Temp 98.8°F | Ht 62.0 in

## 2011-08-16 DIAGNOSIS — J019 Acute sinusitis, unspecified: Secondary | ICD-10-CM

## 2011-08-16 DIAGNOSIS — J209 Acute bronchitis, unspecified: Secondary | ICD-10-CM

## 2011-08-16 MED ORDER — AMPICILLIN 500 MG PO CAPS: 500.0000 mg | ORAL_CAPSULE | Freq: Three times a day (TID) | ORAL | Status: DC

## 2011-08-16 MED ORDER — PREDNISONE (PAK) 10 MG PO TABS: 10.0000 mg | ORAL_TABLET | ORAL | Status: AC

## 2011-08-16 NOTE — Patient Instructions (Signed)
It was good to see you today. Ampicillin antibiotics and prednisone pack - Your prescription(s) have been submitted to your pharmacy. Please take as directed and contact our office if you believe you are having problem(s) with the medication(s). Continue allergy, sinus and pulmonary mediatations as before Call if continued wheeze or other breathing problems, sooner if symptoms worse

## 2011-08-16 NOTE — Progress Notes (Signed)
Subjective:    Patient ID: Jenny [name], female    DOB: 07-31-1933, 77 y.o.   MRN: [id number]  Cough Pertinent negatives include no headaches or wheezing.   complains of sinusitis -  Hx same, recurrent - prefers ampicillin to augmentin Onset 5 days ago - increasing symptoms - no fever or face pain, but chilled, fatigue and discolored nasal dc Chest congestion and nighttime wheeze but feels symptoms predominately in head>chest  Also reviewed chronic med issues:  hx GIB/DU 09/2009- hosp for UGIB related to DU with vv (s/p EGD - dr. Elnoria Howard - caut and clip) neg antibody for h. pylori so no abx assoc with ABL anemia - but no need for transfusion during hosp No longer taking PPI -no recurrent abd pain symptoms and no nausea vomitting or melena - no BRBPR- generally avoiding NSAIDs  CAD s/p MI 720/10 - no CP or anginal symptoms since stent to RCA followed by dr. b. Juanda Chance for same back on ASA/Plavix at dc from hosp despite recent UGI b/c DES was so new (<58mos at time of dx) myoview 03/31/10 negative  COPD/smoking - has been cutting back on smoking since MI - down to 4-6 cig/day - prev 2ppd has considered med asst but wants to cont as she is doing for now dyspnea with exertion but improved since starting advair exac for smoking include family stressors  dyslipidemia - reports improved compliance with ongoing medical treatment since 09/2010 and no changes in medication dose or frequency. denies adverse side effects related to current therapy.   DM2 - dx by a1c 05/2010 in setting of hyperglycemia and wt gain - has worked on diet and weight loss - highest 211# 03/2010 not checking cbgs - following low carb diet  Past Medical History  Diagnosis Date  . TOBACCO ABUSE   . CAD (coronary artery disease) 06/2009    DEs RCA with 70% LAD and EF 60%  . ANEMIA-NOS   . GLAUCOMA   . HYPERLIPIDEMIA   . HYPERTENSION, BENIGN   . COPD     mild obst on PFTs 03/2010  . Diabetes mellitus 06/2010 dx   Mild, diet controlled  . ACUT DUOD ULCER W/HEMORR&PERF W/O MENTION OBST 10/05/2009    NSAID induced  . MYOCARDIAL INFARCTION 06/2009    des to rca  . ALLERGIC RHINITIS CAUSE UNSPECIFIED     Review of Systems  HENT: Negative for facial swelling.   Respiratory: Positive for cough. Negative for wheezing.   Neurological: Negative for headaches.       Objective:   Physical Exam  BP 172/82  Pulse 88  Temp(Src) 98.8 F (37.1 C) (Oral)  Ht 5\' 2"  (1.575 m)  SpO2 94% Constitutional: She is oriented to person, place, and time. She appears well-developed and well-nourished. No distress.  HENT: Head: Normocephalic and atraumatic. Ears; B TMs ok, no erythema or effusion; Nose: Nose normal.  Mouth/Throat: Oropharynx is clear and moist. No oropharyngeal exudate.  Eyes: Conjunctivae and EOM are normal. Pupils are equal, round, and reactive to light. No scleral icterus.  Neck: Normal range of motion. Neck supple. No JVD present. No thyromegaly present.  Cardiovascular: Normal rate, regular rhythm and normal heart sounds.  No murmur heard. No BLE edema. Pulmonary/Chest: slight increased effort at resl and breath sounds with rhonchi in LLL. No respiratory distress but few end exp wheezes. Psychiatric: She has a normal mood and affect. Her behavior is normal. Judgment and thought content normal.       Lab Results  Component Value Date   WBC 7.9 09/13/2010   HGB 13.0 09/13/2010   HCT 38.7 09/13/2010   PLT 273.0 09/13/2010   CHOL 219* 04/05/2011   TRIG 461.0* 04/05/2011   HDL 49.60 04/05/2011   LDLDIRECT 129.0 04/05/2011   ALT 19 09/13/2010   AST 21 09/13/2010   NA 142 05/21/2010   K 4.5 05/21/2010   CL 105 05/21/2010   CREATININE 0.7 05/21/2010   BUN 16 05/21/2010   CO2 27 05/21/2010   TSH 1.34 05/21/2010   INR 1.07 10/06/2009   HGBA1C 6.2 08/07/2011     Assessment & Plan:  Acute sinusitus - developing bronchitis - tx ampicillin (pt pref over augmentin) and pred pak continue pulm meds - pt to  call if symptoms worse

## 2011-08-23 ENCOUNTER — Encounter: Payer: Self-pay | Admitting: Internal Medicine

## 2011-08-23 ENCOUNTER — Ambulatory Visit (INDEPENDENT_AMBULATORY_CARE_PROVIDER_SITE_OTHER): Payer: Medicare Other | Admitting: Internal Medicine

## 2011-08-23 VITALS — BP 162/72 | HR 75 | Temp 97.9°F | Ht 62.0 in

## 2011-08-23 DIAGNOSIS — J45909 Unspecified asthma, uncomplicated: Secondary | ICD-10-CM

## 2011-08-23 DIAGNOSIS — J309 Allergic rhinitis, unspecified: Secondary | ICD-10-CM

## 2011-08-23 MED ORDER — AZITHROMYCIN 250 MG PO TABS: ORAL_TABLET | ORAL | Status: AC

## 2011-08-23 NOTE — Patient Instructions (Signed)
It was good to see you today. Ok to use benadryl and robitussin for allergy and cough symptoms as discussed Zpak antibiotics in place of ampicillin - Your prescription(s) have been submitted to your pharmacy. Please take as directed and contact our office if you believe you are having problem(s) with the medication(s). Continue your other breathing medications as ongoing - call if refills are needed

## 2011-08-23 NOTE — Progress Notes (Signed)
Subjective:    Patient ID: Jenny [name], female    DOB: 01-Dec-1933, 77 y.o.   MRN: [id number]  HPI Here for follow up - sinusitis -  Hx same, recurrent - prefers ampicillin to augmentin tx 1 week ago with pred pak + antibiotics  Improved but unresolved symptoms - no fever or face pain, but fatigue and discolored nasal dc/sputum Chest congestion and nighttime wheeze >AM symptoms   Also reviewed chronic med issues:  hx GIB/DU 09/2009- hosp for UGIB related to DU with vv (s/p EGD - dr. hung - caut and clip) neg antibody for h. pylori so no abx assoc with ABL anemia - but no need for transfusion during hosp No longer taking PPI -no recurrent abd pain symptoms and no nausea vomitting or melena - no BRBPR- generally avoiding NSAIDs  CAD s/p MI 720/10 - no CP or anginal symptoms since stent to RCA followed by dr. b. Juanda Chance for same back on ASA/Plavix at dc from hosp despite recent UGI b/c DES was so new (<90mos at time of dx) myoview 03/31/10 negative  COPD/smoking - has been cutting back on smoking since MI - down to 4-6 cig/day - prev 2ppd has considered med asst but wants to cont as she is doing for now dyspnea with exertion but improved since starting advair exac for smoking include family stressors  dyslipidemia - reports improved compliance with ongoing medical treatment since 09/2010 and no changes in medication dose or frequency. denies adverse side effects related to current therapy.   DM2 - dx by a1c 05/2010 in setting of hyperglycemia and wt gain - has worked on diet and weight loss - highest 211# 03/2010 not checking cbgs - following low carb diet  Past Medical History  Diagnosis Date  . TOBACCO ABUSE   . CAD (coronary artery disease) 06/2009    DEs RCA with 70% LAD and EF 60%  . ANEMIA-NOS   . GLAUCOMA   . HYPERLIPIDEMIA   . HYPERTENSION, BENIGN   . COPD     mild obst on PFTs 03/2010  . Diabetes mellitus 06/2010 dx    Mild, diet controlled  . ACUT DUOD ULCER  W/HEMORR&PERF W/O MENTION OBST 10/05/2009    NSAID induced  . MYOCARDIAL INFARCTION 06/2009    des to rca  . ALLERGIC RHINITIS CAUSE UNSPECIFIED     Review of Systems  Constitutional: Positive for fatigue. Negative for unexpected weight change.  HENT: Negative for facial swelling and neck pain.   Respiratory: Negative for choking.   Cardiovascular: Negative for chest pain, palpitations and leg swelling.       Objective:   Physical Exam  BP 162/72  Pulse 75  Temp(Src) 97.9 F (36.6 C) (Oral)  Ht 5\' 2"  (1.575 m)  SpO2 95% Constitutional: She is overweight. She appears well-developed and well-nourished. No distress.  HENT: Head: Normocephalic and atraumatic. Ears; B TMs ok, no erythema or effusion; Nose: Nose normal.  Mouth/Throat: Oropharynx is clear and moist. No oropharyngeal exudate.  Eyes: Conjunctivae and EOM are normal. Pupils are equal, round, and reactive to light. No scleral icterus.  Neck: Normal range of motion. Neck supple. No JVD present. No thyromegaly present.  Cardiovascular: Normal rate, regular rhythm and normal heart sounds.  No murmur heard. No BLE edema. Pulmonary/Chest: no increased effort at rest and breath sounds diminished BLL. No respiratory distress but few end exp wheezes. Psychiatric: She has a normal mood and affect. Her behavior is normal. Judgment and thought content normal.  Lab Results  Component Value Date   WBC 7.9 09/13/2010   HGB 13.0 09/13/2010   HCT 38.7 09/13/2010   PLT 273.0 09/13/2010   CHOL 219* 04/05/2011   TRIG 461.0* 04/05/2011   HDL 49.60 04/05/2011   LDLDIRECT 129.0 04/05/2011   ALT 19 09/13/2010   AST 21 09/13/2010   NA 142 05/21/2010   K 4.5 05/21/2010   CL 105 05/21/2010   CREATININE 0.7 05/21/2010   BUN 16 05/21/2010   CO2 27 05/21/2010   TSH 1.34 05/21/2010   INR 1.07 10/06/2009   HGBA1C 6.2 08/07/2011     Assessment & Plan:  Acute bronchitis - prior tx ampicillin (pt pref over augmentin) and pred pak for  sinus Improved but not resolved >> will tx with Zpak and antihist/cough suppressant (otc) continue pulm meds - pt to call if symptoms worse

## 2011-09-02 ENCOUNTER — Other Ambulatory Visit (INDEPENDENT_AMBULATORY_CARE_PROVIDER_SITE_OTHER): Payer: Medicare Other

## 2011-09-02 ENCOUNTER — Encounter: Payer: Self-pay | Admitting: Internal Medicine

## 2011-09-02 ENCOUNTER — Ambulatory Visit (INDEPENDENT_AMBULATORY_CARE_PROVIDER_SITE_OTHER): Payer: Medicare Other | Admitting: Internal Medicine

## 2011-09-02 DIAGNOSIS — J40 Bronchitis, not specified as acute or chronic: Secondary | ICD-10-CM

## 2011-09-02 DIAGNOSIS — I10 Essential (primary) hypertension: Secondary | ICD-10-CM

## 2011-09-02 DIAGNOSIS — D649 Anemia, unspecified: Secondary | ICD-10-CM

## 2011-09-02 LAB — CBC WITH DIFFERENTIAL/PLATELET
Basophils Absolute: 0 10*3/uL (ref 0.0–0.1)
Eosinophils Relative: 2.1 % (ref 0.0–5.0)
HCT: 42.3 % (ref 36.0–46.0)
Lymphs Abs: 1.4 10*3/uL (ref 0.7–4.0)
MCV: 97.2 fl (ref 78.0–100.0)
Monocytes Absolute: 0.7 10*3/uL (ref 0.1–1.0)
Platelets: 274 10*3/uL (ref 150.0–400.0)
RDW: 14.1 % (ref 11.5–14.6)

## 2011-09-02 NOTE — Assessment & Plan Note (Signed)
Elevated BP today but usually well controlled so will defer med changes at this time Pt will monitor at home and call if SBP>140 for med adjustments as needed  BP Readings from Last 3 Encounters:  09/02/11 142/68  08/23/11 162/72  08/16/11 172/82

## 2011-09-02 NOTE — Assessment & Plan Note (Signed)
Mild black stool during recent prd course - none now but hx GU/GIB Recheck CBC now - avoid pred and NSAIDs as able

## 2011-09-02 NOTE — Patient Instructions (Signed)
It was good to see you today. Ok to proceed with flu shot as planned Test(s) ordered today. Your results will be called to you after review (48-72hours after test completion). If any changes need to be made, you will be notified at that time.

## 2011-09-02 NOTE — Progress Notes (Signed)
Subjective:    Patient ID: Jenny [name], female    DOB: [birth year], 77 y.o.   MRN: [id number]  HPI  Here for follow up - recurrent sinusitis and bronchitis episodes 08/2011 -  Hx same, recurrent - prefers ampicillin to augmentin tx x2 pred pak + antibiotics  resolution of fever, face pain, fatigue and discolored nasal dc/sputum No chest congestion or wheeze  3 days ?melena while on pred - now stopped No abd pain or BRBPR  Also reviewed chronic med issues:  hx GIB/DU 09/2009- hosp for UGIB related to DU with vv (s/p EGD - dr. Elnoria Howard - caut and clip) neg antibody for h. pylori so no abx assoc with ABL anemia - but no need for transfusion during hosp No longer taking PPI -no recurrent abd pain symptoms and no nausea or vomitting  generally avoiding NSAIDs but ?exac by pred - see above  CAD s/p MI 720/10 - no CP or anginal symptoms since stent to RCA followed by dr. b. Juanda Chance for same back on ASA/Plavix at dc from hosp despite recent UGI b/c DES was so new (<38mos at time of dx) myoview 03/31/10 negative  COPD/smoking - has been cutting back on smoking since MI - down to 4-6 cig/day - prev 2ppd has considered med asst but wants to cont as she is doing for now dyspnea with exertion but improved since starting advair exac for smoking include family stressors  dyslipidemia - reports improved compliance with ongoing medical treatment since 09/2010 and no changes in medication dose or frequency. denies adverse side effects related to current therapy.   DM2 - dx by a1c 05/2010 in setting of hyperglycemia and wt gain - has worked on diet and weight loss - highest 211# 03/2010 not checking cbgs - following low carb diet  Past Medical History  Diagnosis Date  . TOBACCO ABUSE   . CAD (coronary artery disease) 06/2009    DEs RCA with 70% LAD and EF 60%  . ANEMIA-NOS   . GLAUCOMA   . HYPERLIPIDEMIA   . HYPERTENSION, BENIGN   . COPD     mild obst on PFTs 03/2010  . Diabetes mellitus  06/2010 dx    Mild, diet controlled  . ACUT DUOD ULCER W/HEMORR&PERF W/O MENTION OBST 10/05/2009    NSAID induced  . MYOCARDIAL INFARCTION 06/2009    des to rca  . ALLERGIC RHINITIS CAUSE UNSPECIFIED     Review of Systems  Constitutional: Positive for fatigue. Negative for unexpected weight change.  HENT: Negative for facial swelling and neck pain.   Respiratory: Negative for choking.   Cardiovascular: Negative for chest pain, palpitations and leg swelling.       Objective:   Physical Exam  BP 142/68  Pulse 89  Temp(Src) 98.5 F (36.9 C) (Oral)  Ht 5\' 6"  (1.676 m)  Wt 209 lb (94.802 kg)  BMI 33.73 kg/m2  SpO2 95%  Constitutional: She is overweight. She appears well-developed and well-nourished. No distress.  Neck: Normal range of motion. Neck supple. No JVD present. No thyromegaly present.  Cardiovascular: Normal rate, regular rhythm and normal heart sounds.  No murmur heard. No BLE edema. Pulmonary/Chest: no increased effort at rest; breath sounds diminished but clear. No respiratory distress - no wheezes. Abd: obese, SNTND, +BS, no R/G Psychiatric: She has a normal mood and affect. Her behavior is normal. Judgment and thought content normal.       Lab Results  Component Value Date   WBC 7.9 09/13/2010  HGB 13.0 09/13/2010   HCT 38.7 09/13/2010   PLT 273.0 09/13/2010   CHOL 219* 04/05/2011   TRIG 461.0* 04/05/2011   HDL 49.60 04/05/2011   LDLDIRECT 129.0 04/05/2011   ALT 19 09/13/2010   AST 21 09/13/2010   NA 142 05/21/2010   K 4.5 05/21/2010   CL 105 05/21/2010   CREATININE 0.7 05/21/2010   BUN 16 05/21/2010   CO2 27 05/21/2010   TSH 1.34 05/21/2010   INR 1.07 10/06/2009   HGBA1C 6.2 08/07/2011     Assessment & Plan:  See problem list. Medications and labs reviewed today.  Bronchitis/sinusitus - finally resolved! Continue usual resp meds

## 2011-09-26 ENCOUNTER — Encounter: Payer: Self-pay | Admitting: Cardiology

## 2011-09-26 ENCOUNTER — Encounter: Payer: Self-pay | Admitting: Cardiovascular Disease

## 2011-09-27 ENCOUNTER — Ambulatory Visit (INDEPENDENT_AMBULATORY_CARE_PROVIDER_SITE_OTHER): Payer: Medicare Other | Admitting: Cardiovascular Disease

## 2011-09-27 ENCOUNTER — Encounter: Payer: Self-pay | Admitting: Cardiovascular Disease

## 2011-09-27 VITALS — BP 130/58 | HR 70 | Resp 18 | Ht 64.0 in | Wt 204.1 lb

## 2011-09-27 DIAGNOSIS — E785 Hyperlipidemia, unspecified: Secondary | ICD-10-CM

## 2011-09-27 DIAGNOSIS — I10 Essential (primary) hypertension: Secondary | ICD-10-CM

## 2011-09-27 DIAGNOSIS — I2510 Atherosclerotic heart disease of native coronary artery without angina pectoris: Secondary | ICD-10-CM

## 2011-09-27 NOTE — Patient Instructions (Signed)
Your physician wants you to follow-up in:  12 months.  You will receive a reminder letter in the mail two months in advance. If you don't receive a letter, please call our office to schedule the follow-up appointment.   

## 2011-09-27 NOTE — Assessment & Plan Note (Signed)
BP well controlled. No changes.  

## 2011-09-27 NOTE — Progress Notes (Signed)
History of Present Illness:77 yo female with history of CAD, HTN, HLD, COPD and DM here today cardiac follow up. She has been followed in the past by Dr. Kandis Mannan. In July of 2010 she had an inferior MI treated with a DES to the right coronary artery. She had residual 70% LAD stenosis and an ejection fraction of 60%. She also has chronic shortness of breath and moderate chronic obstructive pulmonary disease by pulmonary function testing.   She has been feeling well. No chest pain. Breathing has been at baseline. No dizziness, near syncope or syncope. She has been widowed since 1993. She took a job in [employer] after that and retired in 2010. Overall doing well.    Past Medical History  Diagnosis Date  . TOBACCO ABUSE   . CAD (coronary artery disease) 06/2009    DEs RCA with 70% LAD and EF 60%  . ANEMIA-NOS   . GLAUCOMA   . HYPERLIPIDEMIA   . HYPERTENSION, BENIGN   . COPD     mild obst on PFTs 03/2010  . Diabetes mellitus 06/2010 dx    Mild, diet controlled  . ACUT DUOD ULCER W/HEMORR&PERF W/O MENTION OBST 10/05/2009    NSAID induced  . MYOCARDIAL INFARCTION 06/2009    des to rca  . ALLERGIC RHINITIS CAUSE UNSPECIFIED     Past Surgical History  Procedure Date  . Right knee surgery   . Hemorrhoid surgery 1990    Current Outpatient Prescriptions  Medication Sig Dispense Refill  . albuterol (VENTOLIN HFA) 108 (90 BASE) MCG/ACT inhaler Inhale 2 puffs into the lungs every 6 (six) hours as needed.  18 g  2  . aspirin 81 MG tablet Take 81 mg by mouth daily.        Marland Kitchen atropine-PHENObarbital-scopolamine-hyoscyamine (DONNATAL) 16.2 MG tablet Take 1 tablet by mouth as needed.  30 tablet  0  . atropine-PHENObarbital-scopolamine-hyoscyamine (DONNATAL) 16.2 MG tablet Take 1 tablet by mouth as needed.        Marland Kitchen azelastine (ASTELIN) 137 MCG/SPRAY nasal spray Place 2 sprays into the nose 2 (two) times daily. Use in each nostril as directed  30 mL  2  . Bilberry 500 MG CAPS Take by mouth daily.  Take 1000 mg daily       . clopidogrel (PLAVIX) 75 MG tablet Take 1 tablet (75 mg total) by mouth daily.  30 tablet  6  . DiphenhydrAMINE HCl (BENADRYL PO) Take by mouth daily.        . dorzolamide (TRUSOPT) 2 % ophthalmic solution 1 drop 2 (two) times daily.        . famotidine (PEPCID) 20 MG tablet Take 20 mg by mouth 2 (two) times daily.        . Ferrous Sulfate Dried (FEOSOL) 200 (65 FE) MG TABS Take by mouth daily as needed.        . fexofenadine (ALLEGRA) 180 MG tablet Take 1 tablet (180 mg total) by mouth daily.  30 tablet  2  . Fluticasone-Salmeterol (ADVAIR DISKUS) 250-50 MCG/DOSE AEPB Inhale 1 puff into the lungs 2 (two) times daily.  60 each  2  . latanoprost (XALATAN) 0.005 % ophthalmic solution Place 1 drop into both eyes at bedtime.        . Lutein 20 MG TABS Take by mouth daily.        . magnesium hydroxide (MILK OF MAGNESIA) 400 MG/5ML suspension Take by mouth daily.        . metoprolol (TOPROL-XL) 50  MG 24 hr tablet Take 1 tablet (50 mg total) by mouth daily.  30 tablet  6  . nitroGLYCERIN (NITROSTAT) 0.4 MG SL tablet Place 0.4 mg under the tongue every 5 (five) minutes as needed.        . simvastatin (ZOCOR) 40 MG tablet Take 1 tablet (40 mg total) by mouth at bedtime.  30 tablet  4    Allergies  Allergen Reactions  . Meperidine Hcl     History   Social History  . Marital Status: Widowed    Spouse Name: N/A    Number of Children: N/A  . Years of Education: N/A   Occupational History  . Not on file.   Social History Main Topics  . Smoking status: Current Everyday Smoker -- 0.5 packs/day for 60 years  . Smokeless tobacco: Never Used   Comment: She lives in [place] with her significant other (Charles Hook)0  . Alcohol Use: Yes     Occassional  . Drug Use: No  . Sexually Active: Not on file   Other Topics Concern  . Not on file   Social History Narrative   Lives in [place] with her SO - charles hookRetired -convenince store clerk/cashierEnjoys travel - live  theater/shows    No family history on file.  Review of Systems:  As stated in the HPI and otherwise negative.   BP 130/58  Pulse 70  Resp 18  Ht 5\' 4"  (1.626 m)  Wt 204 lb 1.9 oz (92.588 kg)  BMI 35.04 kg/m2  Physical Examination: General: Well developed, well nourished, NAD HEENT: OP clear, mucus membranes moist SKIN: warm, dry. No rashes. Neuro: No focal deficits Musculoskeletal: Muscle strength 5/5 all ext Psychiatric: Mood and affect normal Neck: No JVD, no carotid bruits, no thyromegaly, no lymphadenopathy. Lungs:Clear bilaterally, no wheezes, rhonci, crackles Cardiovascular: Regular rate and rhythm. No murmurs, gallops or rubs. Abdomen:Soft. Bowel sounds present. Non-tender.  Extremities: No lower extremity edema. Pulses are 2 + in the bilateral DP/PT.  EKG:NSR, rate 73 bpm. 1st degree AV block. LAD. Old inferior infarct. Low voltage precordial leads.

## 2011-09-27 NOTE — Assessment & Plan Note (Signed)
LDL goal 70. She does not want to change to a brand name statin such as Crestor. Followed in primary care.

## 2011-09-27 NOTE — Assessment & Plan Note (Signed)
Stable No changes 

## 2011-11-18 ENCOUNTER — Other Ambulatory Visit: Payer: Self-pay | Admitting: Internal Medicine

## 2011-12-05 ENCOUNTER — Other Ambulatory Visit: Payer: Self-pay | Admitting: Internal Medicine

## 2011-12-27 ENCOUNTER — Other Ambulatory Visit: Payer: Self-pay | Admitting: Internal Medicine

## 2012-01-08 ENCOUNTER — Other Ambulatory Visit (INDEPENDENT_AMBULATORY_CARE_PROVIDER_SITE_OTHER): Payer: Medicare Other

## 2012-01-08 ENCOUNTER — Ambulatory Visit (INDEPENDENT_AMBULATORY_CARE_PROVIDER_SITE_OTHER): Payer: Medicare Other | Admitting: Internal Medicine

## 2012-01-08 ENCOUNTER — Encounter: Payer: Self-pay | Admitting: Internal Medicine

## 2012-01-08 DIAGNOSIS — E119 Type 2 diabetes mellitus without complications: Secondary | ICD-10-CM | POA: Diagnosis not present

## 2012-01-08 DIAGNOSIS — D649 Anemia, unspecified: Secondary | ICD-10-CM

## 2012-01-08 DIAGNOSIS — I10 Essential (primary) hypertension: Secondary | ICD-10-CM

## 2012-01-08 DIAGNOSIS — E785 Hyperlipidemia, unspecified: Secondary | ICD-10-CM

## 2012-01-08 LAB — HEMOGLOBIN A1C: Hgb A1c MFr Bld: 6.4 % (ref 4.6–6.5)

## 2012-01-08 LAB — CBC WITH DIFFERENTIAL/PLATELET
Basophils Relative: 1 % (ref 0.0–3.0)
Eosinophils Relative: 1.9 % (ref 0.0–5.0)
Hemoglobin: 16.7 g/dL — ABNORMAL HIGH (ref 12.0–15.0)
Lymphocytes Relative: 22.9 % (ref 12.0–46.0)
MCHC: 34.4 g/dL (ref 30.0–36.0)
Monocytes Relative: 9.4 % (ref 3.0–12.0)
Neutro Abs: 3.7 10*3/uL (ref 1.4–7.7)
Neutrophils Relative %: 64.8 % (ref 43.0–77.0)
RBC: 5.19 Mil/uL — ABNORMAL HIGH (ref 3.87–5.11)
WBC: 5.7 10*3/uL (ref 4.5–10.5)

## 2012-01-08 LAB — LIPID PANEL
HDL: 52.2 mg/dL (ref >39.00)
VLDL: 41.2 mg/dL — ABNORMAL HIGH (ref 0.0–40.0)

## 2012-01-08 NOTE — Assessment & Plan Note (Signed)
Mild black stool during pred/abx this past fall - none now but hx GU/GIB Recheck CBC now - avoid pred and NSAIDs as able

## 2012-01-08 NOTE — Patient Instructions (Signed)
It was good to see you today. Test(s) ordered today. Your results will be called to you after review (48-72hours after test completion). If any changes need to be made, you will be notified at that time. Medications reviewed, no changes at this time. Please schedule followup in 4-6 months for diabetes check, call sooner if problems.

## 2012-01-08 NOTE — Progress Notes (Signed)
Subjective:    Patient ID: Jenny [name], female    DOB: [birth year], 78 y.o.   MRN: [id number]  HPI  Here for follow up - reviewed chronic med issues:  hx GIB/DU 09/2009- hosp for UGIB related to DU with vv (s/p EGD - dr. Elnoria Howard - caut and clip) neg antibody for h. pylori so no abx assoc with ABL anemia - but no need for transfusion during hosp No longer taking PPI -no recurrent abd pain symptoms and no nausea or vomitting  generally avoiding NSAIDs  CAD s/p MI 06/2009 - stent to RCA no chest pain or anginal symptoms since stent  followed by cards for same back on ASA/Plavix despite UGI 2010 b/c DES was so new (<4mos at time of dx) myoview 03/31/10 negative  COPD/smoking - has been cutting back on smoking since MI - down to 4-6 cig/day - prev 2ppd has considered med asst but wants to cont as she is doing for now dyspnea with exertion but improved since starting advair exac for smoking include family stressors  dyslipidemia - reports improved compliance with ongoing medical treatment since 09/2010 and no changes in medication dose or frequency. denies adverse side effects related to current therapy.   DM2 - dx by a1c 05/2010 in setting of hyperglycemia and wt gain - has worked on diet and weight loss - highest 211# 03/2010 not checking cbgs - following low carb diet  Past Medical History  Diagnosis Date  . TOBACCO ABUSE   . CAD (coronary artery disease) 06/2009    DEs RCA with 70% LAD and EF 60%  . ANEMIA-NOS   . GLAUCOMA   . HYPERLIPIDEMIA   . HYPERTENSION, BENIGN   . COPD     mild obst on PFTs 03/2010  . Diabetes mellitus 06/2010 dx    Mild, diet controlled  . ACUT DUOD ULCER W/HEMORR&PERF W/O MENTION OBST 10/05/2009    NSAID induced  . MYOCARDIAL INFARCTION 06/2009    des to rca  . ALLERGIC RHINITIS CAUSE UNSPECIFIED     Review of Systems  Constitutional: Positive for fatigue. Negative for unexpected weight change.  HENT: Negative for facial swelling and neck pain.     Respiratory: Negative for choking.   Cardiovascular: Negative for chest pain, palpitations and leg swelling.       Objective:   Physical Exam  BP 172/82  Pulse 69  Temp(Src) 97.8 F (36.6 C) (Oral)  Wt 209 lb 12.8 oz (95.165 kg)  SpO2 90% Wt Readings from Last 3 Encounters:  01/08/12 209 lb 12.8 oz (95.165 kg)  09/27/11 204 lb 1.9 oz (92.588 kg)  09/02/11 209 lb (94.802 kg)   Constitutional: She is overweight. She appears well-developed and well-nourished. No distress.  Neck: Normal range of motion. Neck supple. No JVD present. No thyromegaly present.  Cardiovascular: Normal rate, regular rhythm and normal heart sounds.  No murmur heard. No BLE edema. Pulmonary/Chest: no increased effort at rest; breath sounds diminished but clear. No respiratory distress - no wheezes. Abd: obese, SNTND, +BS, no R/G Psychiatric: She has a normal mood and affect. Her behavior is normal. Judgment and thought content normal.       Lab Results  Component Value Date   WBC 7.5 09/02/2011   HGB 14.2 09/02/2011   HCT 42.3 09/02/2011   PLT 274.0 09/02/2011   CHOL 219* 04/05/2011   TRIG 461.0* 04/05/2011   HDL 49.60 04/05/2011   LDLDIRECT 129.0 04/05/2011   ALT 19 09/13/2010   AST 21  09/13/2010   NA 142 05/21/2010   K 4.5 05/21/2010   CL 105 05/21/2010   CREATININE 0.7 05/21/2010   BUN 16 05/21/2010   CO2 27 05/21/2010   TSH 1.34 05/21/2010   INR 1.07 10/06/2009   HGBA1C 6.2 08/07/2011     Assessment & Plan:  See problem list. Medications and labs reviewed today.

## 2012-01-08 NOTE — Assessment & Plan Note (Signed)
Elevated BP today but usually well controlled so will defer med changes at this time Pt will monitor at home and call if SBP>140 for med adjustments as needed  BP Readings from Last 3 Encounters:  01/08/12 172/82  09/27/11 130/58  09/02/11 142/68

## 2012-01-08 NOTE — Assessment & Plan Note (Signed)
Diet controlled DM, previously controlled with weight loss Weight gain reviewed - check a1c now  Lab Results  Component Value Date   HGBA1C 6.2 08/07/2011

## 2012-01-08 NOTE — Assessment & Plan Note (Signed)
On statin - hx CAD/stent 2010  Last lipids reviewed -  Check annually with goal LDL <70 continue present plan and medications.

## 2012-01-09 ENCOUNTER — Other Ambulatory Visit: Payer: Self-pay | Admitting: *Deleted

## 2012-01-09 MED ORDER — ATORVASTATIN CALCIUM 40 MG PO TABS: 40.0000 mg | ORAL_TABLET | Freq: Every day | ORAL | Status: DC

## 2012-01-09 NOTE — Telephone Encounter (Signed)
Called pt with labs results. MD change from simvastatin to generic lipitor sending script to pharmacy...01/09/12@1 :53pm/LMB

## 2012-01-09 NOTE — Telephone Encounter (Signed)
Thanks - stop simva, new atorva, done

## 2012-03-10 ENCOUNTER — Other Ambulatory Visit: Payer: Self-pay | Admitting: Internal Medicine

## 2012-03-11 ENCOUNTER — Other Ambulatory Visit: Payer: Self-pay | Admitting: *Deleted

## 2012-03-11 MED ORDER — BELLADONNA ALK-PHENOBARBITAL 16.2 MG PO TABS: 1.0000 | ORAL_TABLET | ORAL | Status: DC | PRN

## 2012-03-11 NOTE — Telephone Encounter (Signed)
Faxed script back to [facility]... 03/11/12@1 :08am/LMB

## 2012-03-16 ENCOUNTER — Other Ambulatory Visit (INDEPENDENT_AMBULATORY_CARE_PROVIDER_SITE_OTHER): Payer: Medicare Other

## 2012-03-16 ENCOUNTER — Encounter: Payer: Self-pay | Admitting: Internal Medicine

## 2012-03-16 ENCOUNTER — Telehealth: Payer: Self-pay | Admitting: *Deleted

## 2012-03-16 ENCOUNTER — Ambulatory Visit (INDEPENDENT_AMBULATORY_CARE_PROVIDER_SITE_OTHER): Payer: Medicare Other | Admitting: Internal Medicine

## 2012-03-16 VITALS — BP 140/82 | HR 70 | Temp 98.1°F | Resp 16 | Ht 62.0 in | Wt 211.0 lb

## 2012-03-16 DIAGNOSIS — E785 Hyperlipidemia, unspecified: Secondary | ICD-10-CM

## 2012-03-16 DIAGNOSIS — Z79899 Other long term (current) drug therapy: Secondary | ICD-10-CM

## 2012-03-16 DIAGNOSIS — E119 Type 2 diabetes mellitus without complications: Secondary | ICD-10-CM

## 2012-03-16 LAB — LDL CHOLESTEROL, DIRECT: Direct LDL: 112.7 mg/dL

## 2012-03-16 LAB — HEPATIC FUNCTION PANEL
AST: 17 U/L (ref 0–37)
Alkaline Phosphatase: 95 U/L (ref 39–117)
Total Bilirubin: 0.5 mg/dL (ref 0.3–1.2)

## 2012-03-16 LAB — LIPID PANEL: Total CHOL/HDL Ratio: 4

## 2012-03-16 MED ORDER — AMPICILLIN 500 MG PO CAPS: 500.0000 mg | ORAL_CAPSULE | Freq: Three times a day (TID) | ORAL | Status: AC

## 2012-03-16 MED ORDER — BELLADONNA ALK-PHENOBARBITAL 16.2 MG PO TABS: 1.0000 | ORAL_TABLET | Freq: Three times a day (TID) | ORAL | Status: DC | PRN

## 2012-03-16 NOTE — Assessment & Plan Note (Signed)
On statin - hx CAD/stent 2010  Last lipids reviewed -  Check annually with goal LDL <70 Changed from simva 40 to atorva 40 01/2012 -causing itch and myalgia Check labs now but stop atorva due to side effects Consider prava after week of drug holiday

## 2012-03-16 NOTE — Progress Notes (Signed)
Subjective:    Patient ID: Jenny [name], female    DOB: 09-10-1933, 78 y.o.   MRN: [id number]  HPI  Here for follow up - reviewed chronic med issues:  hx GIB/DU 09/2009- hosp for UGIB related to DU with vv (s/p EGD - dr. Elnoria Howard - caut and clip) neg antibody for h. pylori so no abx assoc with ABL anemia - but no need for transfusion during hosp No longer taking PPI -no recurrent abd pain symptoms and no nausea or vomitting  generally avoiding NSAIDs  CAD s/p MI 06/2009 - stent to RCA no chest pain or anginal symptoms since stent  followed by cards for same back on ASA/Plavix despite UGI 2010 b/c DES was so new (<36mos at time of dx) myoview 03/31/10 negative  COPD/smoking - has been cutting back on smoking since MI - down to 4-6 cig/day - prev 2ppd has considered med asst but wants to cont as she is doing for now dyspnea with exertion but improved since starting advair exac for smoking include family stressors  dyslipidemia - changed simva 40 to atorva 40 01/2012 due to subop control - increase myagia - no changes in medication dose or frequency. denies adverse side effects related to current therapy.   DM2 - dx by a1c 05/2010 in setting of hyperglycemia and wt gain - has worked on diet and weight loss - highest 211# 03/2010 not checking cbgs - following low carb diet  Past Medical History  Diagnosis Date  . TOBACCO ABUSE   . CAD (coronary artery disease) 06/2009    DEs RCA with 70% LAD and EF 60%  . ANEMIA-NOS   . GLAUCOMA   . HYPERLIPIDEMIA   . HYPERTENSION, BENIGN   . COPD     mild obst on PFTs 03/2010  . Diabetes mellitus 06/2010 dx    Mild, diet controlled  . ACUT DUOD ULCER W/HEMORR&PERF W/O MENTION OBST 10/05/2009    NSAID induced  . MYOCARDIAL INFARCTION 06/2009    des to rca  . ALLERGIC RHINITIS CAUSE UNSPECIFIED     Review of Systems  Constitutional: Positive for fatigue. Negative for unexpected weight change.  HENT: Negative for facial swelling and neck pain.     Respiratory: Negative for choking.   Cardiovascular: Negative for chest pain, palpitations and leg swelling.       Objective:   Physical Exam  BP 140/82  Pulse 70  Temp(Src) 98.1 F (36.7 C) (Oral)  Resp 16  Ht 5\' 2"  (1.575 m)  Wt 211 lb (95.709 kg)  BMI 38.59 kg/m2  SpO2 97% Wt Readings from Last 3 Encounters:  03/16/12 211 lb (95.709 kg)  01/08/12 209 lb 12.8 oz (95.165 kg)  09/27/11 204 lb 1.9 oz (92.588 kg)   Constitutional: She is overweight. She appears well-developed and well-nourished. No distress.  Neck: Normal range of motion. Neck supple. No JVD present. No thyromegaly present.  Cardiovascular: Normal rate, regular rhythm and normal heart sounds.  No murmur heard. No BLE edema. Pulmonary/Chest: no increased effort at rest; breath sounds diminished but clear. No respiratory distress - no wheezes. Abd: obese, SNTND, +BS, no R/G Psychiatric: She has a normal mood and affect. Her behavior is normal. Judgment and thought content normal.       Lab Results  Component Value Date   WBC 5.7 01/08/2012   HGB 16.7* 01/08/2012   HCT 48.5* 01/08/2012   PLT 216.0 01/08/2012   CHOL 214* 01/08/2012   TRIG 206.0* 01/08/2012   HDL  52.20 01/08/2012   LDLDIRECT 128.3 01/08/2012   ALT 19 09/13/2010   AST 21 09/13/2010   NA 142 05/21/2010   K 4.5 05/21/2010   CL 105 05/21/2010   CREATININE 0.7 05/21/2010   BUN 16 05/21/2010   CO2 27 05/21/2010   TSH 1.34 05/21/2010   INR 1.07 10/06/2009   HGBA1C 6.4 01/08/2012     Assessment & Plan:  See problem list. Medications and labs reviewed today.

## 2012-03-16 NOTE — Assessment & Plan Note (Signed)
Diet controlled DM, previously controlled with weight loss Weight gain reviewed - check a1c now  Lab Results  Component Value Date   HGBA1C 6.4 01/08/2012

## 2012-03-16 NOTE — Patient Instructions (Signed)
It was good to see you today. Test(s) ordered today. Your results will be called to you after review (48-72hours after test completion). If any changes need to be made, you will be notified at that time. Medications reviewed: stop atorvastatin at this time due to side effects - will consider pravastatin after symptoms have resolved (1 week on no cholesterol medications) - refill on ampicillin provided as requested -no other changes at this time Your prescription(s) have been submitted to your pharmacy. Please take as directed and contact our office if you believe you are having problem(s) with the medication(s). Please schedule followup in 3 months for diabetes and cholesterol check, call sooner if problems.

## 2012-03-16 NOTE — Telephone Encounter (Signed)
Received fax stating rx donnantal that was sent in is not covered. Cash price would be $ 400.00. Req md to change to something else... 03/16/12@11 :52am/LMB

## 2012-03-16 NOTE — Telephone Encounter (Signed)
Ask pharmacy to discuss with patient alternative suggestions, then please notify me of recommendations. I will change medication treatment as needed. Thanks

## 2012-03-16 NOTE — Telephone Encounter (Signed)
Faxed info back to [facility] pharmacy with md response... 03/16/12@2 :19pm/LMB

## 2012-03-31 DIAGNOSIS — H1045 Other chronic allergic conjunctivitis: Secondary | ICD-10-CM | POA: Diagnosis not present

## 2012-03-31 DIAGNOSIS — H402290 Chronic angle-closure glaucoma, unspecified eye, stage unspecified: Secondary | ICD-10-CM | POA: Diagnosis not present

## 2012-03-31 DIAGNOSIS — H409 Unspecified glaucoma: Secondary | ICD-10-CM | POA: Diagnosis not present

## 2012-03-31 DIAGNOSIS — H01009 Unspecified blepharitis unspecified eye, unspecified eyelid: Secondary | ICD-10-CM | POA: Diagnosis not present

## 2012-05-20 ENCOUNTER — Ambulatory Visit (INDEPENDENT_AMBULATORY_CARE_PROVIDER_SITE_OTHER): Payer: Medicare Other | Admitting: Internal Medicine

## 2012-05-20 ENCOUNTER — Encounter: Payer: Self-pay | Admitting: Internal Medicine

## 2012-05-20 VITALS — BP 162/72 | HR 69 | Temp 98.4°F | Ht 62.0 in | Wt 212.8 lb

## 2012-05-20 DIAGNOSIS — I10 Essential (primary) hypertension: Secondary | ICD-10-CM | POA: Diagnosis not present

## 2012-05-20 DIAGNOSIS — E785 Hyperlipidemia, unspecified: Secondary | ICD-10-CM

## 2012-05-20 MED ORDER — PRAVASTATIN SODIUM 40 MG PO TABS: 40.0000 mg | ORAL_TABLET | Freq: Every day | ORAL | Status: DC

## 2012-05-20 MED ORDER — BELLADONNA ALK-PHENOBARBITAL 16.2 MG PO TABS: 1.0000 | ORAL_TABLET | Freq: Three times a day (TID) | ORAL | Status: DC | PRN

## 2012-05-20 MED ORDER — LOSARTAN POTASSIUM 50 MG PO TABS: 50.0000 mg | ORAL_TABLET | Freq: Every day | ORAL | Status: DC

## 2012-05-20 NOTE — Assessment & Plan Note (Signed)
On statin - hx CAD/stent 2010  Last lipids reviewed -  Check annually with goal LDL <70 Changed from simva 40 to atorva 40 01/2012 - atorva cause itch and myalgia simva not getting LDL to goal Will try prava now and recheck in 3 months

## 2012-05-20 NOTE — Progress Notes (Signed)
Subjective:    Patient ID: Jenny [name], female    DOB: [birth year], 78 y.o.   MRN: [id number]  HPI  Here for follow up - reviewed chronic med issues:  CAD s/p MI 06/2009 - stent to RCA no chest pain or anginal symptoms since stent  followed by cards for same back on ASA/Plavix despite UGI 2010 b/c DES was so new (<28mos at time of dx) myoview 03/31/10 negative  COPD/smoking - has been cutting back on smoking since MI 2010- down to 4-6 cig/day - prev 2ppd has considered med asst but wants to cont as she is doing for now dyspnea with exertion but improved since starting advair exac for smoking include family stressors  dyslipidemia - changed simva 40 to atorva 40 01/2012 due to subop control - increase myagia - no changes in medication dose or frequency. denies adverse side effects related to current therapy.   DM2 - dx by a1c 05/2010 in setting of hyperglycemia and wt gain - has worked on diet and weight loss - highest 211# 03/2010 not checking cbgs - following low carb diet  hx GIB/DU 09/2009- hosp for UGIB related to DU with vv (s/p EGD - dr. Elnoria Howard - caut and clip) neg antibody for h. pylori so no abx assoc with ABL anemia - but no need for transfusion during hosp No longer taking PPI -no recurrent abd pain symptoms and no nausea or vomitting  generally avoiding NSAIDs   Past Medical History  Diagnosis Date  . TOBACCO ABUSE   . CAD (coronary artery disease) 06/2009    DEs RCA with 70% LAD and EF 60%  . ANEMIA-NOS   . GLAUCOMA   . HYPERLIPIDEMIA   . HYPERTENSION, BENIGN   . COPD     mild obst on PFTs 03/2010  . Diabetes mellitus 06/2010 dx    Mild, diet controlled  . ACUT DUOD ULCER W/HEMORR&PERF W/O MENTION OBST 10/05/2009    NSAID induced  . MYOCARDIAL INFARCTION 06/2009    des to rca  . ALLERGIC RHINITIS CAUSE UNSPECIFIED     Review of Systems  Constitutional: Positive for fatigue. Negative for unexpected weight change.  HENT: Negative for facial swelling and neck  pain.   Respiratory: Negative for choking.   Cardiovascular: Negative for chest pain, palpitations and leg swelling.       Objective:   Physical Exam  BP 162/72  Pulse 69  Temp 98.4 F (36.9 C) (Oral)  Ht 5\' 2"  (1.575 m)  Wt 212 lb 12.8 oz (96.525 kg)  BMI 38.92 kg/m2  SpO2 95% Wt Readings from Last 3 Encounters:  05/20/12 212 lb 12.8 oz (96.525 kg)  03/16/12 211 lb (95.709 kg)  01/08/12 209 lb 12.8 oz (95.165 kg)   Constitutional: She is overweight. She appears well-developed and well-nourished. No distress.  Neck: Normal range of motion. Neck supple. No JVD present. No thyromegaly present.  Cardiovascular: Normal rate, regular rhythm and normal heart sounds.  No murmur heard. No BLE edema. Pulmonary/Chest: no increased effort at rest; breath sounds diminished but clear. No respiratory distress - no wheezes. Psychiatric: She has a normal mood and affect. Her behavior is normal. Judgment and thought content normal.       Lab Results  Component Value Date   WBC 5.7 01/08/2012   HGB 16.7* 01/08/2012   HCT 48.5* 01/08/2012   PLT 216.0 01/08/2012   CHOL 200 03/16/2012   TRIG 212.0* 03/16/2012   HDL 51.80 03/16/2012   LDLDIRECT 112.7 03/16/2012  ALT 21 03/16/2012   AST 17 03/16/2012   NA 142 05/21/2010   K 4.5 05/21/2010   CL 105 05/21/2010   CREATININE 0.7 05/21/2010   BUN 16 05/21/2010   CO2 27 05/21/2010   TSH 1.34 05/21/2010   INR 1.07 10/06/2009   HGBA1C 6.2 03/16/2012     Assessment & Plan:  See problem list. Medications and labs reviewed today.

## 2012-05-20 NOTE — Assessment & Plan Note (Signed)
Elevated BP today  Will avoid increase beta-blocker due to COPD Add ARB - erx done Pt will monitor at home and call if SBP>140 for med adjustments as needed  BP Readings from Last 3 Encounters:  05/20/12 162/72  03/16/12 140/82  01/08/12 172/82

## 2012-05-20 NOTE — Patient Instructions (Signed)
It was good to see you today. Start pravastatin for cholesterol and losartan for blood pressure - Your prescription(s) have been submitted to your pharmacy. Please take as directed and contact our office if you believe you are having problem(s) with the medication(s). refill on donnatal provided as requested -no other changes at this time Please schedule followup in 3 months for diabetes and cholesterol check, call sooner if problems.

## 2012-06-10 ENCOUNTER — Telehealth: Payer: Self-pay | Admitting: Internal Medicine

## 2012-06-10 NOTE — Telephone Encounter (Signed)
Caller: Kemesha/Patient; PCP: Rene Paci; CB#: [phone]; ; ; Call regarding Rash/Hives; She was placed on Pravastatin and is having an allergic reaction.  +itching on breast, and right shoulder.  Described as bumps/rash no pustule or blister.  She started the Pravastain sodium 40mg   06/07/12, the rash started after being medication. Stopped medication on Thursday 06/04/12 with rash is slowly going away. She is currently using cortisone cream to area.  She cannot take any Benadryl products due to heart stent.  She states Torvastastin with calcium caused the same symptoms. Voice is clear and no edema over body.  Please advise what she should do . She taken simvastatin for 3 years prior without any issues.  Please contact patient.  Emergent s/sx ruled out per Allergic Reaction protocol with home care advised and message to office to advise patient .  Her   Pharmacy information has now changed to - [facility] [address]  [phone]

## 2012-06-10 NOTE — Telephone Encounter (Signed)
Agree with advice as given - stop prava -  Will consider resuming simva at later date if needed OV Fri if not improved

## 2012-06-10 NOTE — Telephone Encounter (Signed)
Called patient Discussed Dr. Anthonette Legato recommendations and voiced understanding by Mrs. Jenny [name].She states that the itching has improved since stopping the medication. She will call Friday morning if she feels like she needs to be seen and if her symptoms worsen she is to call the answering service who will get her in touch with the physician on call.

## 2012-07-02 ENCOUNTER — Other Ambulatory Visit: Payer: Self-pay | Admitting: Internal Medicine

## 2012-07-29 DIAGNOSIS — H402290 Chronic angle-closure glaucoma, unspecified eye, stage unspecified: Secondary | ICD-10-CM | POA: Diagnosis not present

## 2012-07-29 DIAGNOSIS — H1045 Other chronic allergic conjunctivitis: Secondary | ICD-10-CM | POA: Diagnosis not present

## 2012-07-29 DIAGNOSIS — H409 Unspecified glaucoma: Secondary | ICD-10-CM | POA: Diagnosis not present

## 2012-07-29 DIAGNOSIS — H2510 Age-related nuclear cataract, unspecified eye: Secondary | ICD-10-CM | POA: Diagnosis not present

## 2012-08-19 ENCOUNTER — Encounter: Payer: Self-pay | Admitting: Internal Medicine

## 2012-08-19 ENCOUNTER — Other Ambulatory Visit (INDEPENDENT_AMBULATORY_CARE_PROVIDER_SITE_OTHER): Payer: Medicare Other

## 2012-08-19 ENCOUNTER — Ambulatory Visit (INDEPENDENT_AMBULATORY_CARE_PROVIDER_SITE_OTHER): Payer: Medicare Other | Admitting: Internal Medicine

## 2012-08-19 VITALS — BP 150/80 | HR 58 | Temp 97.5°F | Resp 16 | Wt 213.0 lb

## 2012-08-19 DIAGNOSIS — E119 Type 2 diabetes mellitus without complications: Secondary | ICD-10-CM | POA: Diagnosis not present

## 2012-08-19 DIAGNOSIS — E785 Hyperlipidemia, unspecified: Secondary | ICD-10-CM

## 2012-08-19 DIAGNOSIS — I10 Essential (primary) hypertension: Secondary | ICD-10-CM | POA: Diagnosis not present

## 2012-08-19 LAB — HEMOGLOBIN A1C: Hgb A1c MFr Bld: 6.1 % (ref 4.6–6.5)

## 2012-08-19 MED ORDER — LOSARTAN POTASSIUM 50 MG PO TABS: 50.0000 mg | ORAL_TABLET | Freq: Two times a day (BID) | ORAL | Status: DC

## 2012-08-19 MED ORDER — SIMVASTATIN 40 MG PO TABS: 40.0000 mg | ORAL_TABLET | Freq: Every day | ORAL | Status: DC

## 2012-08-19 MED ORDER — METOPROLOL SUCCINATE ER 25 MG PO TB24: 25.0000 mg | ORAL_TABLET | Freq: Every day | ORAL | Status: DC

## 2012-08-19 MED ORDER — AMPICILLIN 500 MG PO CAPS: 500.0000 mg | ORAL_CAPSULE | Freq: Three times a day (TID) | ORAL | Status: DC

## 2012-08-19 MED ORDER — BELLADONNA ALK-PHENOBARBITAL 16.2 MG PO TABS: 1.0000 | ORAL_TABLET | Freq: Three times a day (TID) | ORAL | Status: DC | PRN

## 2012-08-19 NOTE — Assessment & Plan Note (Signed)
Elevated BP today  Will avoid increase beta-blocker due to COPD - decrease dose due to fatigue Added ARB 05/2012 - max dose now - erx done Pt will monitor at home and call if SBP>140 for med adjustments as needed  BP Readings from Last 3 Encounters:  08/19/12 150/80  05/20/12 162/72  03/16/12 140/82

## 2012-08-19 NOTE — Assessment & Plan Note (Signed)
Diet controlled DM, previously controlled with weight loss Weight gain reviewed - check a1c now  Lab Results  Component Value Date   HGBA1C 6.2 03/16/2012

## 2012-08-19 NOTE — Assessment & Plan Note (Signed)
On statin - hx CAD/stent 2010  Last lipids reviewed -  Check annually with goal LDL <70 Changed from simva 40 to atorva 40 01/2012 - atorva cause itch and myalgia prava 40mg  caused rash - stop same and resume simva  Reviewed simva not getting LDL to goal, but some is better than no statin

## 2012-08-19 NOTE — Patient Instructions (Signed)
It was good to see you today. Stop pravastatin, resume simvastatin Take 1/2 of 50mg  metoprolol daily and increase losartan to 50mg  tab 2x/day for blood pressure -  Ampicillin for bronchitis (with refill) - Your prescription(s) have been submitted to your pharmacy. Please take as directed and contact our office if you believe you are having problem(s) with the medication(s). refill on donnatal provided as requested -no other changes at this time Test(s) ordered today. Your results will be called to you after review (48-72hours after test completion). If any changes need to be made, you will be notified at that time. Please schedule followup in 4 months for diabetes and blood pressure check, call sooner if problems.

## 2012-08-19 NOTE — Progress Notes (Signed)
Subjective:    Patient ID: Jenny [name], female    DOB: 1933/11/09, 78 y.o.   MRN: [id number]  HPI  Here for follow up -   complains of cough and chest congestion - onset 6 days ago - ? Recurrent bronchitis   Also reviewed chronic med issues:  CAD s/p MI 06/2009 - stent to RCA no chest pain or anginal symptoms since stent  followed by cards for same back on ASA/Plavix despite UGI 2010 b/c DES was so new (<20mos at time of dx) myoview 03/31/10 negative  COPD/smoking - see above has been cutting back on smoking since MI 2010- down to 4-6 cig/day - prev 2ppd Declines med cessation assistance dyspnea with exertion improved since starting advair exac for smoking include family stressors  dyslipidemia - changed simva 40 to atorva 40 01/2012 due to subop control - increase myagia - no changes in medication dose or frequency. denies adverse side effects related to current therapy.   DM2 - dx by a1c 05/2010 in setting of hyperglycemia and wt gain - has worked on diet and weight loss - highest 211# 03/2010 not checking cbgs - following low carb diet  hx GIB/DU 09/2009- hosp for UGIB related to DU with vv (s/p EGD - dr. Elnoria Howard - caut and clip) neg antibody for h. pylori so no abx assoc with ABL anemia - but no need for transfusion during hosp No longer taking PPI -no recurrent abd pain symptoms and no nausea or vomitting  generally avoiding NSAIDs   Past Medical History  Diagnosis Date  . TOBACCO ABUSE   . CAD (coronary artery disease) 06/2009    DEs RCA with 70% LAD and EF 60%  . ANEMIA-NOS   . GLAUCOMA   . HYPERLIPIDEMIA   . HYPERTENSION, BENIGN   . COPD     mild obst on PFTs 03/2010  . Diabetes mellitus 06/2010 dx    Mild, diet controlled  . ACUT DUOD ULCER W/HEMORR&PERF W/O MENTION OBST 10/05/2009    NSAID induced  . MYOCARDIAL INFARCTION 06/2009    des to rca  . ALLERGIC RHINITIS CAUSE UNSPECIFIED     Review of Systems  Constitutional: Positive for fatigue. Negative for  unexpected weight change.  HENT: Negative for facial swelling and neck pain.   Respiratory: Negative for choking.   Cardiovascular: Negative for chest pain, palpitations and leg swelling.       Objective:   Physical Exam  BP 150/80  Pulse 58  Temp 97.5 F (36.4 C) (Oral)  Resp 16  Wt 213 lb (96.616 kg)  SpO2 97% Wt Readings from Last 3 Encounters:  08/19/12 213 lb (96.616 kg)  05/20/12 212 lb 12.8 oz (96.525 kg)  03/16/12 211 lb (95.709 kg)   Constitutional: She is overweight. She appears well-developed and well-nourished. No distress.  Neck: Normal range of motion. Neck supple. No JVD present. No thyromegaly present.  Cardiovascular: Normal rate, regular rhythm and normal heart sounds.  No murmur heard. No BLE edema. Pulmonary/Chest: no increased effort at rest; breath sounds diminished at bases - L>R scattered rhonci. No respiratory distress - no wheezes. Psychiatric: She has a normal mood and affect. Her behavior is normal. Judgment and thought content normal.       Lab Results  Component Value Date   WBC 5.7 01/08/2012   HGB 16.7* 01/08/2012   HCT 48.5* 01/08/2012   PLT 216.0 01/08/2012   CHOL 200 03/16/2012   TRIG 212.0* 03/16/2012   HDL 51.80 03/16/2012  LDLDIRECT 112.7 03/16/2012   ALT 21 03/16/2012   AST 17 03/16/2012   NA 142 05/21/2010   K 4.5 05/21/2010   CL 105 05/21/2010   CREATININE 0.7 05/21/2010   BUN 16 05/21/2010   CO2 27 05/21/2010   TSH 1.34 05/21/2010   INR 1.07 10/06/2009   HGBA1C 6.2 03/16/2012     Assessment & Plan:  See problem list. Medications and labs reviewed today.  Acute bronchitis with COPD exacerbation -

## 2012-09-04 ENCOUNTER — Other Ambulatory Visit: Payer: Self-pay | Admitting: Internal Medicine

## 2012-10-05 DIAGNOSIS — M999 Biomechanical lesion, unspecified: Secondary | ICD-10-CM | POA: Diagnosis not present

## 2012-10-05 DIAGNOSIS — M5137 Other intervertebral disc degeneration, lumbosacral region: Secondary | ICD-10-CM | POA: Diagnosis not present

## 2012-10-12 ENCOUNTER — Encounter (INDEPENDENT_AMBULATORY_CARE_PROVIDER_SITE_OTHER): Payer: Medicare Other

## 2012-10-12 ENCOUNTER — Ambulatory Visit (INDEPENDENT_AMBULATORY_CARE_PROVIDER_SITE_OTHER): Payer: Medicare Other | Admitting: Cardiovascular Disease

## 2012-10-12 ENCOUNTER — Encounter: Payer: Self-pay | Admitting: Cardiovascular Disease

## 2012-10-12 VITALS — BP 172/76 | HR 56 | Ht 62.0 in | Wt 209.0 lb

## 2012-10-12 DIAGNOSIS — I6529 Occlusion and stenosis of unspecified carotid artery: Secondary | ICD-10-CM

## 2012-10-12 DIAGNOSIS — I2510 Atherosclerotic heart disease of native coronary artery without angina pectoris: Secondary | ICD-10-CM

## 2012-10-12 DIAGNOSIS — R0989 Other specified symptoms and signs involving the circulatory and respiratory systems: Secondary | ICD-10-CM

## 2012-10-12 DIAGNOSIS — I10 Essential (primary) hypertension: Secondary | ICD-10-CM

## 2012-10-12 MED ORDER — HYDROCHLOROTHIAZIDE 25 MG PO TABS: 25.0000 mg | ORAL_TABLET | Freq: Every day | ORAL | Status: DC

## 2012-10-12 NOTE — Patient Instructions (Addendum)
Your physician wants you to follow-up in: 12 months.  You will receive a reminder letter in the mail two months in advance. If you don't receive a letter, please call our office to schedule the follow-up appointment.  Your physician has requested that you have a carotid duplex. This test is an ultrasound of the carotid arteries in your neck. It looks at blood flow through these arteries that supply the brain with blood. Allow one hour for this exam. There are no restrictions or special instructions.   Your physician has recommended you make the following change in your medication:  Start hydrochlorothiazide 25 mg by mouth daily

## 2012-10-12 NOTE — Progress Notes (Signed)
History of Present Illness: 78 yo WF with history of CAD, HTN, HLD, COPD and DM here today cardiac follow up. She has been followed in the past by Dr. Kandis Mannan. In July of 2010 she had an inferior MI treated with a DES to the right coronary artery. She had residual 70% LAD stenosis and an ejection fraction of 60%. Stress myoview negative for ischemia April 2011.  She also has chronic shortness of breath and moderate chronic obstructive pulmonary disease by pulmonary function testing. She has continued to smoke despite advice against this. Statin was changed this year by Dr. Felicity Coyer for sub-optimal control.   She has been feeling well. No chest pain. Breathing has been at baseline. No dizziness, near syncope or syncope. She has been widowed since 1993. She has been having some hip pain but this is better since seeing the chiropractor. She is being treated for bronchitis. She continues to smoke 1/2 ppd.   Primary Care Physician: Felicity Coyer  Last Lipid Profile:Lipid Panel     Component Value Date/Time   CHOL 200 03/16/2012 1001   TRIG 212.0* 03/16/2012 1001   HDL 51.80 03/16/2012 1001   CHOLHDL 4 03/16/2012 1001   VLDL 42.4* 03/16/2012 1001   LDLCALC  Value: 128        Total Cholesterol/HDL:CHD Risk Coronary Heart Disease Risk Table                     Men   Women  1/2 Average Risk   3.4   3.3  Average Risk       5.0   4.4  2 X Average Risk   9.6   7.1  3 X Average Risk  23.4   11.0        Use the calculated Patient Ratio above and the CHD Risk Table to determine the patient's CHD Risk.        ATP III CLASSIFICATION (LDL):  <100     mg/dL   Optimal  161-096  mg/dL   Near or Above                    Optimal  130-159  mg/dL   Borderline  045-409  mg/dL   High  >811     mg/dL   Very High* 08/22/7828 0630     Past Medical History  Diagnosis Date  . TOBACCO ABUSE   . CAD (coronary artery disease) 06/2009    DEs RCA with 70% LAD and EF 60%  . ANEMIA-NOS   . GLAUCOMA   . HYPERLIPIDEMIA   . HYPERTENSION, BENIGN     . COPD     mild obst on PFTs 03/2010  . Diabetes mellitus 06/2010 dx    Mild, diet controlled  . ACUT DUOD ULCER W/HEMORR&PERF W/O MENTION OBST 10/05/2009    NSAID induced  . MYOCARDIAL INFARCTION 06/2009    des to rca  . ALLERGIC RHINITIS CAUSE UNSPECIFIED     Past Surgical History  Procedure Date  . Right knee surgery   . Hemorrhoid surgery 1990    Current Outpatient Prescriptions  Medication Sig Dispense Refill  . ADVAIR DISKUS 250-50 MCG/DOSE AEPB ONE PUFF TWICE DAILY  60 each  2  . aspirin 81 MG tablet Take 81 mg by mouth daily.        Marland Kitchen azelastine (ASTELIN) 137 MCG/SPRAY nasal spray Place 1 spray into the nose 2 (two) times daily. Use in each nostril as directed      .  belladonna alk-PHENObarbital (DONNATAL) 16.2 MG tablet Take 1 tab as needed      . Bilberry 500 MG CAPS Take by mouth daily. Take 1000 mg daily       . clopidogrel (PLAVIX) 75 MG tablet TAKE ONE TABLET EACH DAY  30 tablet  11  . dorzolamide (TRUSOPT) 2 % ophthalmic solution 1 drop 2 (two) times daily.        . famotidine (PEPCID) 20 MG tablet Take 20 mg by mouth 2 (two) times daily.        . Ferrous Sulfate Dried (FEOSOL) 200 (65 FE) MG TABS Take by mouth daily as needed.        . latanoprost (XALATAN) 0.005 % ophthalmic solution Place 1 drop into both eyes at bedtime.        Marland Kitchen losartan (COZAAR) 50 MG tablet Take 1 tablet (50 mg total) by mouth 2 (two) times daily.  180 tablet  1  . Lutein 20 MG TABS Take by mouth daily.        . magnesium hydroxide (MILK OF MAGNESIA) 400 MG/5ML suspension Take by mouth daily.        . metoprolol succinate (TOPROL-XL) 25 MG 24 hr tablet Take 1 tablet (25 mg total) by mouth daily. Take with or immediately following a meal.  30 tablet  11  . nitroGLYCERIN (NITROSTAT) 0.4 MG SL tablet Place 0.4 mg under the tongue every 5 (five) minutes as needed.        . simvastatin (ZOCOR) 40 MG tablet Take 1 tablet (40 mg total) by mouth at bedtime.  30 tablet  5  . VENTOLIN HFA 108 (90 BASE)  MCG/ACT inhaler INHALE ONE PUFF EVERY 6 HOURS AS        NEEDED FOR SHORTNESS OF BREATH  18 g  1  . [DISCONTINUED] belladonna alk-PHENObarbital (DONNATAL) 16.2 MG tablet Take 1 tablet by mouth every 8 (eight) hours as needed.  100 tablet  0  . azelastine (ASTELIN) 137 MCG/SPRAY nasal spray Place 2 sprays into the nose 2 (two) times daily. Use in each nostril as directed  30 mL  2    Allergies  Allergen Reactions  . Meperidine Hcl     History   Social History  . Marital Status: Widowed    Spouse Name: N/A    Number of Children: N/A  . Years of Education: N/A   Occupational History  . Not on file.   Social History Main Topics  . Smoking status: Current Every Day Smoker -- 0.5 packs/day for 60 years  . Smokeless tobacco: Never Used     Comment: She lives in [place] with her significant other (Charles Hook)0  . Alcohol Use: Yes     Comment: Occassional  . Drug Use: No  . Sexually Active: Not on file   Other Topics Concern  . Not on file   Social History Narrative   Lives in [place] with her SO - charles hookRetired -convenince store clerk/cashierEnjoys travel - live theater/shows    No family history on file.  Review of Systems:  As stated in the HPI and otherwise negative.   BP 172/76  Ht 5\' 2"  (1.575 m)  Wt 209 lb (94.802 kg)  BMI 38.23 kg/m2  SpO2 98%  Physical Examination: General: Well developed, well nourished, NAD HEENT: OP clear, mucus membranes moist SKIN: warm, dry. No rashes. Neuro: No focal deficits Musculoskeletal: Muscle strength 5/5 all ext Psychiatric: Mood and affect normal Neck: No JVD, right carotid bruit,  faint. No thyromegaly, no lymphadenopathy. Lungs:Clear bilaterally, no wheezes, rhonci, crackles. Prolonged exp phase Cardiovascular: Regular rate and rhythm. No murmurs, gallops or rubs. Abdomen:Soft. Bowel sounds present. Non-tender.  Extremities: No lower extremity edema. Pulses are 2 + in the bilateral DP/PT.  EKG: Sinus brady, rate 56 bpm. 1st  degree AV block.   Assessment and Plan:   1. CAD: s/p DES RCA in 2010, residual LAD stenosis. No ischemia on stress test 2011. Stable. No changes. Continue ASA, statin, Plavix, beta blocker.   2. HYPERTENSION: Elevated today. Will add HCTZ 25 mg poQDaily.   3. HYPERLIPIDEMIA: Continue statin. Followed in primary care. Does not tolerate other statins so will continue Zocor.   4. Carotid artery bruit: will check carotid artery dopplers. High probability of disease given her CAD and tobacco abuse.

## 2012-10-13 DIAGNOSIS — M5137 Other intervertebral disc degeneration, lumbosacral region: Secondary | ICD-10-CM | POA: Diagnosis not present

## 2012-10-13 DIAGNOSIS — M999 Biomechanical lesion, unspecified: Secondary | ICD-10-CM | POA: Diagnosis not present

## 2012-10-16 DIAGNOSIS — M5137 Other intervertebral disc degeneration, lumbosacral region: Secondary | ICD-10-CM | POA: Diagnosis not present

## 2012-10-16 DIAGNOSIS — M999 Biomechanical lesion, unspecified: Secondary | ICD-10-CM | POA: Diagnosis not present

## 2012-11-13 DIAGNOSIS — Z23 Encounter for immunization: Secondary | ICD-10-CM | POA: Diagnosis not present

## 2012-11-16 ENCOUNTER — Encounter: Payer: Self-pay | Admitting: Internal Medicine

## 2012-12-03 ENCOUNTER — Other Ambulatory Visit: Payer: Self-pay | Admitting: Internal Medicine

## 2012-12-22 ENCOUNTER — Encounter: Payer: Self-pay | Admitting: Internal Medicine

## 2012-12-22 ENCOUNTER — Ambulatory Visit (INDEPENDENT_AMBULATORY_CARE_PROVIDER_SITE_OTHER): Payer: Medicare Other | Admitting: Internal Medicine

## 2012-12-22 ENCOUNTER — Ambulatory Visit (INDEPENDENT_AMBULATORY_CARE_PROVIDER_SITE_OTHER)
Admission: RE | Admit: 2012-12-22 | Discharge: 2012-12-22 | Disposition: A | Discharge status: Home / Self Care | Payer: Medicare Other | Source: Ambulatory Visit | Attending: Internal Medicine | Admitting: Internal Medicine

## 2012-12-22 VITALS — BP 142/84 | HR 68 | Temp 98.2°F | Ht 62.0 in | Wt 211.0 lb

## 2012-12-22 DIAGNOSIS — M76899 Other specified enthesopathies of unspecified lower limb, excluding foot: Secondary | ICD-10-CM

## 2012-12-22 DIAGNOSIS — M25559 Pain in unspecified hip: Secondary | ICD-10-CM | POA: Diagnosis not present

## 2012-12-22 DIAGNOSIS — J449 Chronic obstructive pulmonary disease, unspecified: Secondary | ICD-10-CM | POA: Diagnosis not present

## 2012-12-22 DIAGNOSIS — M7062 Trochanteric bursitis, left hip: Secondary | ICD-10-CM

## 2012-12-22 DIAGNOSIS — M25552 Pain in left hip: Secondary | ICD-10-CM

## 2012-12-22 MED ORDER — AMPICILLIN 500 MG PO CAPS: 500.0000 mg | ORAL_CAPSULE | Freq: Three times a day (TID) | ORAL | Status: AC

## 2012-12-22 MED ORDER — BUDESONIDE-FORMOTEROL FUMARATE 80-4.5 MCG/ACT IN AERO: 2.0000 | INHALATION_SPRAY | Freq: Two times a day (BID) | RESPIRATORY_TRACT | Status: DC

## 2012-12-22 NOTE — Patient Instructions (Addendum)
It was good to see you today. Change advair to symbicort -Your prescription(s) have been submitted to your pharmacy. Please take as directed and contact our office if you believe you are having problem(s) with the medication(s). Injection with cortisone of your left trochanteric bursitis for pain control - see below for exercises - place ice to injection site for 10-15 minutes every 3-4h for next 48h, and as needed Test(s) ordered today. Your results will be released to MyChart (or called to you) after review, usually within 72hours after test completion. If any changes need to be made, you will be notified at that same time. Trochanteric Bursitis You have hip pain due to trochanteric bursitis. Bursitis means that the sack near the outside of the hip is filled with fluid and inflamed. This sack is made up of protective soft tissue. The pain from trochanteric bursitis can be severe and keep you from sleep. It can radiate to the buttocks or down the outside of the thigh to the knee. The pain is almost always worse when rising from the seated or lying position and with walking. Pain can improve after you take a few steps. It happens more often in people with hip joint and lumbar spine problems, such as arthritis or previous surgery. Very rarely the trochanteric bursa can become infected, and antibiotics and/or surgery may be needed. Treatment often includes an injection of local anesthetic mixed with cortisone medicine. This medicine is injected into the area where it is most tender over the hip. Repeat injections may be necessary if the response to treatment is slow. You can apply ice packs over the tender area for 30 minutes every 2 hours for the next few days. Anti-inflammatory and/or narcotic pain medicine may also be helpful. Limit your activity for the next few days if the pain continues. See your caregiver in 5-10 days if you are not greatly improved.   SEEK IMMEDIATE MEDICAL CARE IF:  You develop  severe pain, fever, or increased redness.   You have pain that radiates below the knee.  EXERCISES STRETCHING EXERCISES - Trochantic Bursitis  These exercises may help you when beginning to rehabilitate your injury. Your symptoms may resolve with or without further involvement from your physician, physical therapist or athletic trainer. While completing these exercises, remember:    Restoring tissue flexibility helps normal motion to return to the joints. This allows healthier, less painful movement and activity.   An effective stretch should be held for at least 30 seconds.   A stretch should never be painful. You should only feel a gentle lengthening or release in the stretched tissue.  STRETCH  Iliotibial Band  On the floor or bed, lie on your side so your injured leg is on top. Bend your knee and grab your ankle.   Slowly bring your knee back so that your thigh is in line with your trunk. Keep your heel at your buttocks and gently arch your back so your head, shoulders and hips line up.   Slowly lower your leg so that your knee approaches the floor/bed until you feel a gentle stretch on the outside of your thigh. If you do not feel a stretch and your knee will not fall farther, place the heel of your opposite foot on top of your knee and pull your thigh down farther.   Hold this stretch for __________ seconds.   Repeat __________ times. Complete this exercise __________ times per day.  STRETCH Hamstrings, Supine   Lie on your back.  Loop a belt or towel over the ball of your foot as shown.   Straighten your knee and slowly pull on the belt to raise your injured leg. Do not allow the knee to bend. Keep your opposite leg flat on the floor.   Raise the leg until you feel a gentle stretch behind your knee or thigh. Hold this position for __________ seconds.   Repeat __________ times. Complete this stretch __________ times per day.  STRETCH - Quadriceps, Prone   Lie on your stomach on  a firm surface, such as a bed or padded floor.   Bend your knee and grasp your ankle. If you are unable to reach, your ankle or pant leg, use a belt around your foot to lengthen your reach.   Gently pull your heel toward your buttocks. Your knee should not slide out to the side. You should feel a stretch in the front of your thigh and/or knee.   Hold this position for __________ seconds.   Repeat __________ times. Complete this stretch __________ times per day.  STRETCHING - Hip Flexors, Lunge Half kneel with your knee on the floor and your opposite knee bent and directly over your ankle.  Keep good posture with your head over your shoulders. Tighten your buttocks to point your tailbone downward; this will prevent your back from arching too much.   You should feel a gentle stretch in the front of your thigh and/or hip. If you do not feel any resistance, slightly slide your opposite foot forward and then slowly lunge forward so your knee once again lines up over your ankle. Be sure your tailbone remains pointed downward.   Hold this stretch for __________ seconds.   Repeat __________ times. Complete this stretch __________ times per day.  STRETCH - Adductors, Lunge  While standing, spread your legs   Lean away from your injured leg by bending your opposite knee. You may rest your hands on your thigh for balance.   You should feel a stretch in your inner thigh. Hold for __________ seconds.   Repeat __________ times. Complete this exercise __________ times per day.  Document Released: 01/02/2005 Document Revised: 02/17/2012 Document Reviewed: 03/09/2009 [facility] Patient Information 2013 [place], [place].

## 2012-12-22 NOTE — Progress Notes (Signed)
Subjective:    Patient ID: Jenny [name], female    DOB: October 30, 1933, 79 y.o.   MRN: [id number]  HPI complains of left lateral hip pain Ongoing >39mo- wax and wane symptoms  Remote fall but no new injury recalled Worse in AM or lying on left side in bed Worse with overuse - walking, housework exertion No radiation into back or below knee  Also formulary change for advair needed  Past Medical History  Diagnosis Date  . TOBACCO ABUSE   . CAD (coronary artery disease) 06/2009    DEs RCA with 70% LAD and EF 60%  . ANEMIA-NOS   . GLAUCOMA   . HYPERLIPIDEMIA   . HYPERTENSION, BENIGN   . COPD     mild obst on PFTs 03/2010  . Diabetes mellitus 06/2010 dx    Mild, diet controlled  . ACUT DUOD ULCER W/HEMORR&PERF W/O MENTION OBST 10/05/2009    NSAID induced  . MYOCARDIAL INFARCTION 06/2009    des to rca  . ALLERGIC RHINITIS CAUSE UNSPECIFIED     Review of Systems  Constitutional: Positive for fatigue. Negative for fever and unexpected weight change.  Respiratory: Positive for shortness of breath. Negative for cough.   Musculoskeletal: Negative for myalgias, back pain and joint swelling.       Objective:   Physical Exam BP 142/84  Pulse 68  Temp 98.2 F (36.8 C) (Oral)  Ht 5\' 2"  (1.575 m)  Wt 211 lb (95.709 kg)  BMI 38.59 kg/m2  SpO2 95% Wt Readings from Last 3 Encounters:  12/22/12 211 lb (95.709 kg)  10/12/12 209 lb (94.802 kg)  08/19/12 213 lb (96.616 kg)   Constitutional: She is overweight, but appears well-developed and well-nourished. No distress.  Neck: Normal range of motion. Neck supple. No JVD present. No thyromegaly present.  Cardiovascular: Normal rate, regular rhythm and normal heart sounds.  No murmur heard. No BLE edema. Pulmonary/Chest: Effort normal and breath sounds normal. No respiratory distress. She has no wheezes.  Abdominal: Soft. Bowel sounds are normal. She exhibits no distension. There is no tenderness. no masses Musculoskeletal: exquisitely  tender over greater troch bursa on left -  L hip with normal range of motion, no groin tenderness. No gross deformities Skin: Skin is warm and dry. No rash noted. No erythema.  Psychiatric: She has a normal mood and affect. Her behavior is normal. Judgment and thought content normal.   Lab Results  Component Value Date   WBC 5.7 01/08/2012   HGB 16.7* 01/08/2012   HCT 48.5* 01/08/2012   PLT 216.0 01/08/2012   GLUCOSE 99 05/21/2010   CHOL 200 03/16/2012   TRIG 212.0* 03/16/2012   HDL 51.80 03/16/2012   LDLDIRECT 112.7 03/16/2012   LDLCALC  Value: 128      07/08/2009   ALT 21 03/16/2012   AST 17 03/16/2012   NA 142 05/21/2010   K 4.5 05/21/2010   CL 105 05/21/2010   CREATININE 0.7 05/21/2010   BUN 16 05/21/2010   CO2 27 05/21/2010   TSH 1.34 05/21/2010   INR 1.07 10/06/2009   HGBA1C 6.1 08/19/2012     Procedure Note:  Greater trochanteric bursa injection the patient elects to proceed after verbal consent is obtained. the patient informed of possible risks and complications prior to procedure. Using sterile technique throughout, patient is injected with 1:3 DepoMedrol (40 mg): 1% lidoocaine w/o epi into trochanteric bursa at site of maximal tenderness. the patient tolerated the procedure well. Ice 24-48h, heat thereafter as needed  instructions aftercare provided.      Assessment & Plan:   L trochanteric bursitis -  cortisone injection as above -  education, PT (home) exercises and aftercare instructions provided -  also xray hip given hx remote fall but feel fx or DJD less lkely cause of pain symptoms   Also see problem list. Medications and labs reviewed today.

## 2012-12-22 NOTE — Assessment & Plan Note (Signed)
Change advair to formulary approved symbicort - erx done Continue Alb prn At baseline symptoms today

## 2012-12-23 NOTE — Progress Notes (Addendum)
  Subjective:    Patient ID: Jenny [name], female    DOB: 25-Jan-1933, 79 y.o.   MRN: [id number]  Ongoing L hip pain HPI Pt presents with left hip pain.  Notes history of fall one and a half years ago involving hip and wrist where hip pain resolved.  In the past few months hip causing more pain. Pt denies new trauma to the area.  The pain is in the hip sometimes extending to the mid thigh Pt reports pain is sometimes a 8/10 worse in the morning with significant stiffness and also with bending or repetitive motions like vaccuuming or sweeping.  The pain has been waking her up at night and it hurts to lay on the left side.  She feels the pain is affected by weather and also affects her gait.  She has tried tylenol and mineral ice gel with minimal short term relief.   Past Medical History  Diagnosis Date  . TOBACCO ABUSE   . CAD (coronary artery disease) 06/2009    DEs RCA with 70% LAD and EF 60%  . ANEMIA-NOS   . GLAUCOMA   . HYPERLIPIDEMIA   . HYPERTENSION, BENIGN   . COPD     mild obst on PFTs 03/2010  . Diabetes mellitus 06/2010 dx    Mild, diet controlled  . ACUT DUOD ULCER W/HEMORR&PERF W/O MENTION OBST 10/05/2009    NSAID induced  . MYOCARDIAL INFARCTION 06/2009    des to rca  . ALLERGIC RHINITIS CAUSE UNSPECIFIED     Review of Systems  Respiratory: Positive for shortness of breath.   Gastrointestinal: Negative for abdominal pain.  Genitourinary: Negative for difficulty urinating.  Musculoskeletal: Positive for gait problem. Negative for myalgias, back pain and joint swelling.  Skin: Negative for rash and wound.  Neurological: Negative for weakness and numbness.       Objective:   Physical Exam  Constitutional: She is oriented to person, place, and time. She appears well-developed and well-nourished. She is active. No distress.  Cardiovascular: Normal rate, regular rhythm and normal heart sounds.   Pulmonary/Chest: Effort normal and breath sounds normal. No respiratory  distress.  Abdominal: Soft. Bowel sounds are normal. She exhibits no distension and no mass. There is no tenderness.  Musculoskeletal: Normal range of motion.       Left hip: She exhibits tenderness. She exhibits normal range of motion, no swelling, no crepitus and no deformity.  Neurological: She is alert and oriented to person, place, and time.  Skin: Skin is warm and dry.          Assessment & Plan:  Trochanteric Bursitis of L Hip- Pt given 40 mg depomedrol/ 1%lidocaine injection per Dr. Felicity Coyer.  Ordered x-ray of hip as confirmatory testing to rule out arthritic changes or injury from noted fall.  Pt educated on diagnosis, injection and expected side effects and results, pt given exercises to practice at home and instructed to call back if symptoms worsen or are not relieved within the week post-injection.  Connye Burkitt PA-S   I have personally reviewed this case with PA student on day of OV. I also personally examined this patient. I agree with history and findings as documented above. I reviewed, discussed and approve of the assessment and plan as listed above. Rene Paci, MD

## 2012-12-29 DIAGNOSIS — M5137 Other intervertebral disc degeneration, lumbosacral region: Secondary | ICD-10-CM | POA: Diagnosis not present

## 2012-12-29 DIAGNOSIS — M999 Biomechanical lesion, unspecified: Secondary | ICD-10-CM | POA: Diagnosis not present

## 2012-12-30 DIAGNOSIS — H40039 Anatomical narrow angle, unspecified eye: Secondary | ICD-10-CM | POA: Diagnosis not present

## 2012-12-30 DIAGNOSIS — M999 Biomechanical lesion, unspecified: Secondary | ICD-10-CM | POA: Diagnosis not present

## 2012-12-30 DIAGNOSIS — H04129 Dry eye syndrome of unspecified lacrimal gland: Secondary | ICD-10-CM | POA: Diagnosis not present

## 2012-12-30 DIAGNOSIS — M5137 Other intervertebral disc degeneration, lumbosacral region: Secondary | ICD-10-CM | POA: Diagnosis not present

## 2012-12-30 DIAGNOSIS — H2510 Age-related nuclear cataract, unspecified eye: Secondary | ICD-10-CM | POA: Diagnosis not present

## 2012-12-30 DIAGNOSIS — H01009 Unspecified blepharitis unspecified eye, unspecified eyelid: Secondary | ICD-10-CM | POA: Diagnosis not present

## 2012-12-31 DIAGNOSIS — M5137 Other intervertebral disc degeneration, lumbosacral region: Secondary | ICD-10-CM | POA: Diagnosis not present

## 2012-12-31 DIAGNOSIS — M999 Biomechanical lesion, unspecified: Secondary | ICD-10-CM | POA: Diagnosis not present

## 2013-01-01 DIAGNOSIS — M999 Biomechanical lesion, unspecified: Secondary | ICD-10-CM | POA: Diagnosis not present

## 2013-01-01 DIAGNOSIS — M5137 Other intervertebral disc degeneration, lumbosacral region: Secondary | ICD-10-CM | POA: Diagnosis not present

## 2013-01-08 ENCOUNTER — Other Ambulatory Visit: Payer: Self-pay

## 2013-01-08 DIAGNOSIS — M999 Biomechanical lesion, unspecified: Secondary | ICD-10-CM | POA: Diagnosis not present

## 2013-01-08 DIAGNOSIS — M5137 Other intervertebral disc degeneration, lumbosacral region: Secondary | ICD-10-CM | POA: Diagnosis not present

## 2013-01-08 MED ORDER — ALBUTEROL SULFATE HFA 108 (90 BASE) MCG/ACT IN AERS: INHALATION_SPRAY | RESPIRATORY_TRACT | Status: DC

## 2013-01-12 DIAGNOSIS — M999 Biomechanical lesion, unspecified: Secondary | ICD-10-CM | POA: Diagnosis not present

## 2013-01-12 DIAGNOSIS — M5137 Other intervertebral disc degeneration, lumbosacral region: Secondary | ICD-10-CM | POA: Diagnosis not present

## 2013-01-18 DIAGNOSIS — M999 Biomechanical lesion, unspecified: Secondary | ICD-10-CM | POA: Diagnosis not present

## 2013-01-18 DIAGNOSIS — M5137 Other intervertebral disc degeneration, lumbosacral region: Secondary | ICD-10-CM | POA: Diagnosis not present

## 2013-01-20 ENCOUNTER — Other Ambulatory Visit (INDEPENDENT_AMBULATORY_CARE_PROVIDER_SITE_OTHER): Payer: Medicare Other

## 2013-01-20 ENCOUNTER — Ambulatory Visit (INDEPENDENT_AMBULATORY_CARE_PROVIDER_SITE_OTHER): Payer: Medicare Other | Admitting: Internal Medicine

## 2013-01-20 ENCOUNTER — Encounter: Payer: Self-pay | Admitting: Internal Medicine

## 2013-01-20 VITALS — BP 142/80 | HR 66 | Temp 97.4°F | Ht 62.0 in | Wt 210.1 lb

## 2013-01-20 DIAGNOSIS — E119 Type 2 diabetes mellitus without complications: Secondary | ICD-10-CM

## 2013-01-20 DIAGNOSIS — K269 Duodenal ulcer, unspecified as acute or chronic, without hemorrhage or perforation: Secondary | ICD-10-CM | POA: Diagnosis not present

## 2013-01-20 DIAGNOSIS — R1013 Epigastric pain: Secondary | ICD-10-CM

## 2013-01-20 DIAGNOSIS — I10 Essential (primary) hypertension: Secondary | ICD-10-CM

## 2013-01-20 DIAGNOSIS — K3189 Other diseases of stomach and duodenum: Secondary | ICD-10-CM | POA: Diagnosis not present

## 2013-01-20 LAB — CBC WITH DIFFERENTIAL/PLATELET
Basophils Relative: 0.5 % (ref 0.0–3.0)
Eosinophils Relative: 2.6 % (ref 0.0–5.0)
HCT: 48 % — ABNORMAL HIGH (ref 36.0–46.0)
MCV: 94.6 fl (ref 78.0–100.0)
Monocytes Absolute: 0.6 10*3/uL (ref 0.1–1.0)
Monocytes Relative: 9.2 % (ref 3.0–12.0)
Neutrophils Relative %: 66.8 % (ref 43.0–77.0)
RBC: 5.08 Mil/uL (ref 3.87–5.11)
WBC: 6.4 10*3/uL (ref 4.5–10.5)

## 2013-01-20 MED ORDER — OMEPRAZOLE 40 MG PO CPDR: 40.0000 mg | DELAYED_RELEASE_CAPSULE | Freq: Every day | ORAL | Status: DC

## 2013-01-20 MED ORDER — GLUCOSE BLOOD VI STRP: ORAL_STRIP | Status: DC

## 2013-01-20 NOTE — Assessment & Plan Note (Signed)
Diet controlled DM, previously controlled with weight loss On ARB, statin and ASA 81 Weight trends reviewed - check a1c now  Lab Results  Component Value Date   HGBA1C 6.1 08/19/2012

## 2013-01-20 NOTE — Progress Notes (Signed)
Subjective:    Patient ID: Jenny [name], female    DOB: 10-Jun-1933, 79 y.o.   MRN: [id number]  HPI  Here for follow up - reviewed chronic med issues:  CAD s/p MI 06/2009 - stent to RCA no chest pain or anginal symptoms since stent  followed by cards for same back on ASA/Plavix despite UGI 2010 b/c DES was so new (<106mos at time of dx) myoview 03/31/10 negative  COPD - has been cutting back on smoking since MI 2010- down to 4-6 cig/day - prev 2ppd Declines med cessation assistance dyspnea with exertion improved since starting advair exac for smoking include family stressors  dyslipidemia - changed simva 40 to atorva 40 01/2012 due to subop control - increase myagia - no changes in medication dose or frequency. denies adverse side effects related to current therapy.   DM2 - dx by a1c 05/2010 in setting of hyperglycemia and weight gain - has worked on diet and weight loss - highest 211# 03/2010 not checking cbgs - following low carb diet  hx GIB/DU 09/2009- hosp for UGIB related to DU with vv (s/p EGD - dr. Elnoria Howard - caut and clip) neg antibody for h. pylori so no abx assoc with ABL anemia - but no need for transfusion during hosp No longer taking PPI -no recurrent abd pain symptoms and no nausea or vomitting  generally avoiding NSAIDs   Past Medical History  Diagnosis Date  . TOBACCO ABUSE   . CAD (coronary artery disease) 06/2009    DEs RCA with 70% LAD and EF 60%  . ANEMIA-NOS   . GLAUCOMA   . HYPERLIPIDEMIA   . HYPERTENSION, BENIGN   . COPD     mild obst on PFTs 03/2010  . Diabetes mellitus 06/2010 dx    Mild, diet controlled  . ACUT DUOD ULCER W/HEMORR&PERF W/O MENTION OBST 10/05/2009    NSAID induced  . MYOCARDIAL INFARCTION 06/2009    des to rca  . ALLERGIC RHINITIS CAUSE UNSPECIFIED     Review of Systems  Constitutional: Positive for fatigue. Negative for unexpected weight change.  HENT: Negative for facial swelling and neck pain.   Respiratory: Negative for choking.    Cardiovascular: Negative for chest pain, palpitations and leg swelling.       Objective:   Physical Exam  BP 142/80  Pulse 66  Temp(Src) 97.4 F (36.3 C) (Oral)  Ht 5\' 2"  (1.575 m)  Wt 210 lb 1.9 oz (95.31 kg)  BMI 38.42 kg/m2  SpO2 97% Wt Readings from Last 3 Encounters:  01/20/13 210 lb 1.9 oz (95.31 kg)  12/22/12 211 lb (95.709 kg)  10/12/12 209 lb (94.802 kg)   Constitutional: She is overweight. She appears well-developed and well-nourished. No distress.  Neck: Normal range of motion. Neck supple. No JVD present. No thyromegaly present.  Cardiovascular: Normal rate, regular rhythm and normal heart sounds.  No murmur heard. No BLE edema. Pulmonary/Chest: no increased effort at rest; breath sounds diminished at bases -  Few B scattered rhonci. No respiratory distress - no wheezes. Psychiatric: She has a normal mood and affect. Her behavior is normal. Judgment and thought content normal.       Lab Results  Component Value Date   WBC 5.7 01/08/2012   HGB 16.7* 01/08/2012   HCT 48.5* 01/08/2012   PLT 216.0 01/08/2012   CHOL 200 03/16/2012   TRIG 212.0* 03/16/2012   HDL 51.80 03/16/2012   LDLDIRECT 112.7 03/16/2012   ALT 21 03/16/2012  AST 17 03/16/2012   NA 142 05/21/2010   K 4.5 05/21/2010   CL 105 05/21/2010   CREATININE 0.7 05/21/2010   BUN 16 05/21/2010   CO2 27 05/21/2010   TSH 1.34 05/21/2010   INR 1.07 10/06/2009   HGBA1C 6.1 08/19/2012     Assessment & Plan:  See problem list. Medications and labs reviewed today.

## 2013-01-20 NOTE — Patient Instructions (Signed)
It was good to see you today. Medications reviewed, change Pepcid to omeprazole for reflux - no other changes Your prescription(s) have been given to you to submit to your pharmacy. Please take as directed and contact our office if you believe you are having problem(s) with the medication(s). Test(s) ordered today. Your results will be released to MyChart (or called to you) after review, usually within 72hours after test completion. If any changes need to be made, you will be notified at that same time. Please schedule followup in 6 months for diabetes, cholesterol and blood pressure check, call sooner if problems.

## 2013-01-20 NOTE — Assessment & Plan Note (Addendum)
Elevated BP today - same on manual recheck Will avoid increase beta-blocker due to COPD - decrease dose due to fatigue Added ARB 05/2012 - increased to max dose 08/2012 Reminded pt to monitor at home and call if SBP>140 for med adjustments as needed  BP Readings from Last 3 Encounters:  01/20/13 142/80  12/22/12 142/84  10/12/12 172/76

## 2013-01-20 NOTE — Assessment & Plan Note (Signed)
Hx DU with GIB 09/2009 - no longer on PPI but regular h2b use Increasing reflux after meals - change back to PPI Also check CBC given hx DU but no melena Unable to afford prior donnatal -

## 2013-01-28 DIAGNOSIS — M999 Biomechanical lesion, unspecified: Secondary | ICD-10-CM | POA: Diagnosis not present

## 2013-01-28 DIAGNOSIS — M5137 Other intervertebral disc degeneration, lumbosacral region: Secondary | ICD-10-CM | POA: Diagnosis not present

## 2013-02-04 DIAGNOSIS — M5137 Other intervertebral disc degeneration, lumbosacral region: Secondary | ICD-10-CM | POA: Diagnosis not present

## 2013-02-04 DIAGNOSIS — M999 Biomechanical lesion, unspecified: Secondary | ICD-10-CM | POA: Diagnosis not present

## 2013-02-10 DIAGNOSIS — M5137 Other intervertebral disc degeneration, lumbosacral region: Secondary | ICD-10-CM | POA: Diagnosis not present

## 2013-02-10 DIAGNOSIS — M999 Biomechanical lesion, unspecified: Secondary | ICD-10-CM | POA: Diagnosis not present

## 2013-03-01 DIAGNOSIS — M999 Biomechanical lesion, unspecified: Secondary | ICD-10-CM | POA: Diagnosis not present

## 2013-03-01 DIAGNOSIS — M5137 Other intervertebral disc degeneration, lumbosacral region: Secondary | ICD-10-CM | POA: Diagnosis not present

## 2013-05-06 ENCOUNTER — Other Ambulatory Visit: Payer: Self-pay | Admitting: Internal Medicine

## 2013-06-14 LAB — HM DIABETES EYE EXAM

## 2013-06-30 DIAGNOSIS — H402290 Chronic angle-closure glaucoma, unspecified eye, stage unspecified: Secondary | ICD-10-CM | POA: Diagnosis not present

## 2013-06-30 DIAGNOSIS — H2510 Age-related nuclear cataract, unspecified eye: Secondary | ICD-10-CM | POA: Diagnosis not present

## 2013-07-14 ENCOUNTER — Other Ambulatory Visit: Payer: Self-pay | Admitting: Internal Medicine

## 2013-07-20 ENCOUNTER — Ambulatory Visit (INDEPENDENT_AMBULATORY_CARE_PROVIDER_SITE_OTHER): Payer: Medicare Other | Admitting: Internal Medicine

## 2013-07-20 ENCOUNTER — Encounter: Payer: Self-pay | Admitting: Internal Medicine

## 2013-07-20 ENCOUNTER — Other Ambulatory Visit (INDEPENDENT_AMBULATORY_CARE_PROVIDER_SITE_OTHER): Payer: Medicare Other

## 2013-07-20 VITALS — BP 168/82 | HR 71 | Temp 98.4°F | Wt 213.4 lb

## 2013-07-20 DIAGNOSIS — I10 Essential (primary) hypertension: Secondary | ICD-10-CM | POA: Diagnosis not present

## 2013-07-20 DIAGNOSIS — E119 Type 2 diabetes mellitus without complications: Secondary | ICD-10-CM | POA: Diagnosis not present

## 2013-07-20 DIAGNOSIS — E785 Hyperlipidemia, unspecified: Secondary | ICD-10-CM | POA: Diagnosis not present

## 2013-07-20 DIAGNOSIS — L723 Sebaceous cyst: Secondary | ICD-10-CM

## 2013-07-20 LAB — LIPID PANEL
Cholesterol: 228 mg/dL — ABNORMAL HIGH (ref 0–200)
HDL: 47.9 mg/dL (ref >39.00)
Triglycerides: 321 mg/dL — ABNORMAL HIGH (ref 0.0–149.0)

## 2013-07-20 LAB — BASIC METABOLIC PANEL
CO2: 29 mEq/L (ref 19–32)
Calcium: 8.8 mg/dL (ref 8.4–10.5)
Creatinine, Ser: 0.8 mg/dL (ref 0.4–1.2)

## 2013-07-20 MED ORDER — METOPROLOL SUCCINATE ER 25 MG PO TB24: 25.0000 mg | ORAL_TABLET | Freq: Two times a day (BID) | ORAL | Status: DC

## 2013-07-20 NOTE — Patient Instructions (Signed)
It was good to see you today. Medications reviewed, continue Pepcid in place of omeprazole for reflux, increase metoprolol to 2x/day for blood pressure  - no other changes Your prescription(s) have been given to you to submit to your pharmacy. Please take as directed and contact our office if you believe you are having problem(s) with the medication(s). Test(s) ordered today. Your results will be released to MyChart (or called to you) after review, usually within 72hours after test completion. If any changes need to be made, you will be notified at that same time. Please schedule followup in 6 months for diabetes, cholesterol and blood pressure check, call sooner if problems.

## 2013-07-20 NOTE — Progress Notes (Signed)
Subjective:    Patient ID: Jenny [name], female    DOB: 27-Oct-1933, 79 y.o.   MRN: [id number]  HPI  Here for follow up - reviewed chronic med issues:  CAD s/p MI 06/2009 - stent to RCA no chest pain or anginal symptoms since stent  followed by cards for same back on ASA/Plavix despite UGI 2010 b/c DES was so new (<16mos at time of dx) myoview 03/31/10 negative  COPD - has been cutting back on smoking since MI 2010- down to 4-6 cig/day - prev 2ppd Declines med cessation assistance dyspnea with exertion improved since starting advair exac for smoking include family stressors  dyslipidemia - on simva 40, intol of atorva and prava trials in past - no recent changes in medication dose or frequency. denies adverse side effects related to current therapy.   DM2, diet controlled -  dx by a1c 05/2010 in setting of hyperglycemia and weight gain - has worked on diet and weight loss - not checking cbgs - following low carb diet  hx GIB/DU 09/2009- hosp for UGIB related to DU with vv (s/p EGD - dr. Elnoria Howard - caut and clip) neg antibody for h. pylori so no abx assoc with ABL anemia - but no need for transfusion during hosp No longer taking PPI -no recurrent abd pain symptoms and no nausea or vomitting  generally avoiding NSAIDs   Past Medical History  Diagnosis Date  . TOBACCO ABUSE   . CAD (coronary artery disease) 06/2009    DEs RCA with 70% LAD and EF 60%  . ANEMIA-NOS   . GLAUCOMA   . HYPERLIPIDEMIA   . HYPERTENSION, BENIGN   . COPD     mild obst on PFTs 03/2010  . Diabetes mellitus 06/2010 dx    Mild, diet controlled  . ACUT DUOD ULCER W/HEMORR&PERF W/O MENTION OBST 10/05/2009    NSAID induced  . MYOCARDIAL INFARCTION 06/2009    des to rca  . ALLERGIC RHINITIS CAUSE UNSPECIFIED     Review of Systems  Constitutional: Positive for fatigue. Negative for unexpected weight change.  HENT: Negative for facial swelling and neck pain.   Respiratory: Negative for choking.    Cardiovascular: Negative for chest pain, palpitations and leg swelling.       Objective:   Physical Exam  BP 168/82  Pulse 71  Temp(Src) 98.4 F (36.9 C) (Oral)  Wt 213 lb 6.4 oz (96.798 kg)  BMI 39.02 kg/m2  SpO2 96% Wt Readings from Last 3 Encounters:  07/20/13 213 lb 6.4 oz (96.798 kg)  01/20/13 210 lb 1.9 oz (95.31 kg)  12/22/12 211 lb (95.709 kg)   Constitutional: She is overweight. She appears well-developed and well-nourished. No distress.  Neck: Normal range of motion. Neck supple. No JVD present. No thyromegaly present.  Cardiovascular: Normal rate, regular rhythm and normal heart sounds.  No murmur heard. No BLE edema. Pulmonary/Chest: no increased effort at rest; breath sounds diminished at bases -  Few B scattered rhonci. No respiratory distress - no wheezes. Skin: 1.5 cm x 1 cm sebaceous cyst upper back at midline, no evidence for erythema, abnormal warmth or drainage at this time Psychiatric: She has a normal mood and affect. Her behavior is normal. Judgment and thought content normal.       Lab Results  Component Value Date   WBC 6.4 01/20/2013   HGB 16.6* 01/20/2013   HCT 48.0* 01/20/2013   PLT 234.0 01/20/2013   CHOL 200 03/16/2012   TRIG 212.0* 03/16/2012  HDL 51.80 03/16/2012   LDLDIRECT 112.7 03/16/2012   ALT 21 03/16/2012   AST 17 03/16/2012   NA 142 05/21/2010   K 4.5 05/21/2010   CL 105 05/21/2010   CREATININE 0.7 05/21/2010   BUN 16 05/21/2010   CO2 27 05/21/2010   TSH 1.34 05/21/2010   INR 1.07 10/06/2009   HGBA1C 6.3 01/20/2013     Assessment & Plan:  See problem list. Medications and labs reviewed today.  Sebaceous cyst, midline upper back. Surgical removal in past unsuccessful. No evidence of infection at this time. Education provided. We'll plan future referral to general surgeon for repeat excision as needed, recommended observation at this time

## 2013-07-20 NOTE — Assessment & Plan Note (Signed)
On statin - hx CAD/stent 2010  Last lipids reviewed -  Check annually with goal LDL <70 Changed from simva 40 to atorva 40 01/2012 - atorva cause itch and myalgia prava 40mg  caused rash - stop same and resumed simva  08/2012 Reviewed simva not getting LDL to goal, but some is better than no statin

## 2013-07-20 NOTE — Assessment & Plan Note (Signed)
Diet controlled DM, previously controlled with weight loss On ARB, statin and ASA 81 Weight trends reviewed - check a1c now  Lab Results  Component Value Date   HGBA1C 6.3 01/20/2013

## 2013-07-20 NOTE — Assessment & Plan Note (Signed)
Elevated BP today - same on manual recheck Will cautiously increase beta-blocker monitoring COPD flares, potential bradycardia and increase fatigue Added ARB 05/2012 and increased to max dose 08/2012 Encouraged to comply with HCTZ qd Reminded pt to monitor at home and call if SBP>140 for med adjustments as needed  BP Readings from Last 3 Encounters:  07/20/13 168/82  01/20/13 142/80  12/22/12 142/84

## 2013-08-11 DIAGNOSIS — M999 Biomechanical lesion, unspecified: Secondary | ICD-10-CM | POA: Diagnosis not present

## 2013-08-11 DIAGNOSIS — M5137 Other intervertebral disc degeneration, lumbosacral region: Secondary | ICD-10-CM | POA: Diagnosis not present

## 2013-08-12 DIAGNOSIS — M999 Biomechanical lesion, unspecified: Secondary | ICD-10-CM | POA: Diagnosis not present

## 2013-08-12 DIAGNOSIS — M5137 Other intervertebral disc degeneration, lumbosacral region: Secondary | ICD-10-CM | POA: Diagnosis not present

## 2013-09-27 DIAGNOSIS — M999 Biomechanical lesion, unspecified: Secondary | ICD-10-CM | POA: Diagnosis not present

## 2013-09-27 DIAGNOSIS — M5137 Other intervertebral disc degeneration, lumbosacral region: Secondary | ICD-10-CM | POA: Diagnosis not present

## 2013-09-30 DIAGNOSIS — M999 Biomechanical lesion, unspecified: Secondary | ICD-10-CM | POA: Diagnosis not present

## 2013-09-30 DIAGNOSIS — M5137 Other intervertebral disc degeneration, lumbosacral region: Secondary | ICD-10-CM | POA: Diagnosis not present

## 2013-10-04 DIAGNOSIS — M5137 Other intervertebral disc degeneration, lumbosacral region: Secondary | ICD-10-CM | POA: Diagnosis not present

## 2013-10-04 DIAGNOSIS — M999 Biomechanical lesion, unspecified: Secondary | ICD-10-CM | POA: Diagnosis not present

## 2013-10-08 DIAGNOSIS — M5137 Other intervertebral disc degeneration, lumbosacral region: Secondary | ICD-10-CM | POA: Diagnosis not present

## 2013-10-08 DIAGNOSIS — M999 Biomechanical lesion, unspecified: Secondary | ICD-10-CM | POA: Diagnosis not present

## 2013-10-08 DIAGNOSIS — Z23 Encounter for immunization: Secondary | ICD-10-CM | POA: Diagnosis not present

## 2013-10-11 DIAGNOSIS — M999 Biomechanical lesion, unspecified: Secondary | ICD-10-CM | POA: Diagnosis not present

## 2013-10-11 DIAGNOSIS — M5137 Other intervertebral disc degeneration, lumbosacral region: Secondary | ICD-10-CM | POA: Diagnosis not present

## 2013-10-15 DIAGNOSIS — M5137 Other intervertebral disc degeneration, lumbosacral region: Secondary | ICD-10-CM | POA: Diagnosis not present

## 2013-10-15 DIAGNOSIS — M999 Biomechanical lesion, unspecified: Secondary | ICD-10-CM | POA: Diagnosis not present

## 2013-10-19 ENCOUNTER — Encounter: Payer: Self-pay | Admitting: Cardiovascular Disease

## 2013-10-19 ENCOUNTER — Ambulatory Visit (INDEPENDENT_AMBULATORY_CARE_PROVIDER_SITE_OTHER): Payer: Medicare Other | Admitting: Cardiovascular Disease

## 2013-10-19 ENCOUNTER — Other Ambulatory Visit: Payer: Self-pay | Admitting: Cardiovascular Disease

## 2013-10-19 ENCOUNTER — Encounter: Payer: Self-pay | Admitting: Cardiology

## 2013-10-19 ENCOUNTER — Ambulatory Visit (HOSPITAL_COMMUNITY): Payer: Medicare Other | Attending: Cardiovascular Disease

## 2013-10-19 VITALS — BP 128/82 | HR 69 | Resp 12 | Wt 215.0 lb

## 2013-10-19 DIAGNOSIS — I2510 Atherosclerotic heart disease of native coronary artery without angina pectoris: Secondary | ICD-10-CM | POA: Insufficient documentation

## 2013-10-19 DIAGNOSIS — I779 Disorder of arteries and arterioles, unspecified: Secondary | ICD-10-CM

## 2013-10-19 DIAGNOSIS — J449 Chronic obstructive pulmonary disease, unspecified: Secondary | ICD-10-CM | POA: Insufficient documentation

## 2013-10-19 DIAGNOSIS — R0989 Other specified symptoms and signs involving the circulatory and respiratory systems: Secondary | ICD-10-CM | POA: Diagnosis not present

## 2013-10-19 DIAGNOSIS — E785 Hyperlipidemia, unspecified: Secondary | ICD-10-CM | POA: Diagnosis not present

## 2013-10-19 DIAGNOSIS — J4489 Other specified chronic obstructive pulmonary disease: Secondary | ICD-10-CM | POA: Insufficient documentation

## 2013-10-19 DIAGNOSIS — E119 Type 2 diabetes mellitus without complications: Secondary | ICD-10-CM | POA: Diagnosis not present

## 2013-10-19 DIAGNOSIS — I10 Essential (primary) hypertension: Secondary | ICD-10-CM | POA: Diagnosis not present

## 2013-10-19 DIAGNOSIS — Z720 Tobacco use: Secondary | ICD-10-CM

## 2013-10-19 DIAGNOSIS — I6529 Occlusion and stenosis of unspecified carotid artery: Secondary | ICD-10-CM | POA: Diagnosis not present

## 2013-10-19 DIAGNOSIS — I658 Occlusion and stenosis of other precerebral arteries: Secondary | ICD-10-CM | POA: Diagnosis not present

## 2013-10-19 DIAGNOSIS — F17200 Nicotine dependence, unspecified, uncomplicated: Secondary | ICD-10-CM

## 2013-10-19 NOTE — Progress Notes (Signed)
History of Present Illness: 79 yo WF with history of CAD, HTN, HLD, COPD and DM here today cardiac follow up. She has been followed in the past by Dr. Juanda Chance. In July of 2010 she had an inferior MI treated with a DES to the right coronary artery. She had residual 70% LAD stenosis and an ejection fraction of 60%. Stress myoview negative for ischemia April 2011.  She also has chronic shortness of breath and moderate chronic obstructive pulmonary disease by pulmonary function testing. She has continued to smoke despite advice against this.    She has been feeling well. No chest pain. Breathing has been at baseline. No dizziness, near syncope or syncope. She has been widowed since 1993. She continues to smoke 1/2 ppd.   Primary Care Physician: Felicity Coyer  Last Lipid Profile:Lipid Panel     Component Value Date/Time   CHOL 228* 07/20/2013 1111   TRIG 321.0* 07/20/2013 1111   HDL 47.90 07/20/2013 1111   CHOLHDL 5 07/20/2013 1111   VLDL 64.2* 07/20/2013 1111   LDLCALC 137 07/20/2013 1111     Past Medical History  Diagnosis Date  . TOBACCO ABUSE   . CAD (coronary artery disease) 06/2009    DEs RCA with 70% LAD and EF 60%  . ANEMIA-NOS   . GLAUCOMA   . HYPERLIPIDEMIA   . HYPERTENSION, BENIGN   . COPD     mild obst on PFTs 03/2010  . Diabetes mellitus 06/2010 dx    Mild, diet controlled  . ACUT DUOD ULCER W/HEMORR&PERF W/O MENTION OBST 10/05/2009    NSAID induced  . MYOCARDIAL INFARCTION 06/2009    des to rca  . ALLERGIC RHINITIS CAUSE UNSPECIFIED     Past Surgical History  Procedure Laterality Date  . Right knee surgery    . Hemorrhoid surgery  1990    Current Outpatient Prescriptions  Medication Sig Dispense Refill  . aspirin 81 MG tablet Take 81 mg by mouth daily.        . budesonide-formoterol (SYMBICORT) 80-4.5 MCG/ACT inhaler Inhale 2 puffs into the lungs 2 (two) times daily.  1 Inhaler  3  . clopidogrel (PLAVIX) 75 MG tablet TAKE ONE TABLET EACH DAY  30 tablet  5  .  dorzolamide (TRUSOPT) 2 % ophthalmic solution 1 drop 2 (two) times daily.        . famotidine (PEPCID) 20 MG tablet Take 20 mg by mouth 2 (two) times daily.      . Ferrous Sulfate Dried (FEOSOL) 200 (65 FE) MG TABS Take by mouth daily as needed.        Marland Kitchen glucose blood test strip Use as instructed  100 each  12  . hydrochlorothiazide (HYDRODIURIL) 25 MG tablet Take 1 tablet (25 mg total) by mouth daily.  30 tablet  11  . latanoprost (XALATAN) 0.005 % ophthalmic solution Place 1 drop into both eyes at bedtime.        Marland Kitchen losartan (COZAAR) 50 MG tablet Take 50 mg by mouth daily.      . Lutein 20 MG TABS Take by mouth daily.        . magnesium hydroxide (MILK OF MAGNESIA) 400 MG/5ML suspension Take by mouth daily.        . metoprolol succinate (TOPROL-XL) 25 MG 24 hr tablet Take 25 mg by mouth daily.      Marland Kitchen NITROSTAT 0.4 MG SL tablet DISSOLVE ONE TABLET UNDER TONGUE AS NEEDED FOR CHEST PAIN - MAY REPEAT TWICE-IF NO RELIEF  GO TO NEAREST HOSPITAL ER  25 tablet  0  . simvastatin (ZOCOR) 40 MG tablet TAKE ONE TABLET AT BEDTIME  30 tablet  5  . VENTOLIN HFA 108 (90 BASE) MCG/ACT inhaler INHALE ONE PUFF EVERY 6 HOURS AS NEEDED FOR SHORTNESS OF BREATH  18 g  2   No current facility-administered medications for this visit.    Allergies  Allergen Reactions  . Atorvastatin     Itching and myaliga  . Meperidine Hcl   . Pravastatin     rash    History   Social History  . Marital Status: Widowed    Spouse Name: N/A    Number of Children: N/A  . Years of Education: N/A   Occupational History  . Not on file.   Social History Main Topics  . Smoking status: Current Every Day Smoker -- 0.50 packs/day for 60 years  . Smokeless tobacco: Never Used     Comment: She lives in [place] with her significant other (Charles Hook)0  . Alcohol Use: Yes     Comment: Occassional  . Drug Use: No  . Sexual Activity: Not on file   Other Topics Concern  . Not on file   Social History Narrative   Lives in [place]  with her SO - charles hook   Retired -Acupuncturist   Enjoys travel - live theater/shows    No family history on file.  Review of Systems:  As stated in the HPI and otherwise negative.   BP 128/82  Pulse 69  Resp 12  Wt 215 lb (97.523 kg)  Physical Examination: General: Well developed, well nourished, NAD HEENT: OP clear, mucus membranes moist SKIN: warm, dry. No rashes. Neuro: No focal deficits Musculoskeletal: Muscle strength 5/5 all ext Psychiatric: Mood and affect normal Neck: No JVD, right carotid bruit, faint. No thyromegaly, no lymphadenopathy. Lungs:Clear bilaterally, no wheezes, rhonci, crackles. Prolonged exp phase Cardiovascular: Regular rate and rhythm. No murmurs, gallops or rubs. Abdomen:Soft. Bowel sounds present. Non-tender.  Extremities: No lower extremity edema. Pulses are 2 + in the bilateral DP/PT.  EKG: Sinus brady, rate 69 bpm. 1st degree AV block. Poor R wave progression precordial leads.   Assessment and Plan:   1. CAD: s/p DES RCA in 2010, residual moderate LAD stenosis by cath 2010. No ischemia on stress test 2011. Stable. No changes. Continue ASA, statin, Plavix, beta blocker. Will arrange stress myoview to exclude ischemia.   2. HYPERTENSION: BP controlled. No changes today.    3. HYPERLIPIDEMIA: Continue statin. Followed in primary care. Does not tolerate other statins so will continue Zocor  4. Carotid artery disease: Carotid artery dopplers today with mild bilateral disease.   5. Tobacco abuse: Smoking cessation recommended. She is trying to stop

## 2013-10-19 NOTE — Patient Instructions (Signed)
Your physician wants you to follow-up in:  12 months. You will receive a reminder letter in the mail two months in advance. If you don't receive a letter, please call our office to schedule the follow-up appointment.  Your physician has requested that you have a lexiscan myoview. For further information please visit [email or web address]. Please follow instruction sheet, as given.    

## 2013-11-08 DIAGNOSIS — M999 Biomechanical lesion, unspecified: Secondary | ICD-10-CM | POA: Diagnosis not present

## 2013-11-08 DIAGNOSIS — M5137 Other intervertebral disc degeneration, lumbosacral region: Secondary | ICD-10-CM | POA: Diagnosis not present

## 2013-11-11 DIAGNOSIS — M999 Biomechanical lesion, unspecified: Secondary | ICD-10-CM | POA: Diagnosis not present

## 2013-11-11 DIAGNOSIS — M5137 Other intervertebral disc degeneration, lumbosacral region: Secondary | ICD-10-CM | POA: Diagnosis not present

## 2013-12-08 ENCOUNTER — Encounter: Payer: Self-pay | Admitting: Cardiology

## 2013-12-15 ENCOUNTER — Encounter (INDEPENDENT_AMBULATORY_CARE_PROVIDER_SITE_OTHER): Payer: Self-pay

## 2013-12-15 ENCOUNTER — Encounter: Payer: Self-pay | Admitting: Cardiovascular Disease

## 2013-12-15 ENCOUNTER — Ambulatory Visit (HOSPITAL_COMMUNITY): Payer: Medicare Other | Attending: Cardiovascular Disease | Admitting: Radiology

## 2013-12-15 DIAGNOSIS — I2510 Atherosclerotic heart disease of native coronary artery without angina pectoris: Secondary | ICD-10-CM

## 2013-12-15 DIAGNOSIS — R0989 Other specified symptoms and signs involving the circulatory and respiratory systems: Secondary | ICD-10-CM

## 2013-12-15 MED ORDER — TECHNETIUM TC 99M SESTAMIBI GENERIC - CARDIOLITE
33.0000 | Freq: Once | INTRAVENOUS | Status: AC | PRN
  Administered 2013-12-15: 33 via INTRAVENOUS

## 2013-12-20 ENCOUNTER — Encounter: Payer: Self-pay | Admitting: Cardiology

## 2013-12-20 ENCOUNTER — Encounter (INDEPENDENT_AMBULATORY_CARE_PROVIDER_SITE_OTHER): Payer: Self-pay

## 2013-12-20 ENCOUNTER — Ambulatory Visit (HOSPITAL_COMMUNITY): Payer: Medicare Other | Attending: Cardiology | Admitting: Radiology

## 2013-12-20 VITALS — BP 171/79 | HR 62 | Ht 62.0 in | Wt 209.0 lb

## 2013-12-20 DIAGNOSIS — E785 Hyperlipidemia, unspecified: Secondary | ICD-10-CM | POA: Insufficient documentation

## 2013-12-20 DIAGNOSIS — E119 Type 2 diabetes mellitus without complications: Secondary | ICD-10-CM | POA: Diagnosis not present

## 2013-12-20 DIAGNOSIS — I2510 Atherosclerotic heart disease of native coronary artery without angina pectoris: Secondary | ICD-10-CM

## 2013-12-20 DIAGNOSIS — R0602 Shortness of breath: Secondary | ICD-10-CM

## 2013-12-20 DIAGNOSIS — J449 Chronic obstructive pulmonary disease, unspecified: Secondary | ICD-10-CM | POA: Insufficient documentation

## 2013-12-20 DIAGNOSIS — F17200 Nicotine dependence, unspecified, uncomplicated: Secondary | ICD-10-CM | POA: Diagnosis not present

## 2013-12-20 DIAGNOSIS — I779 Disorder of arteries and arterioles, unspecified: Secondary | ICD-10-CM | POA: Insufficient documentation

## 2013-12-20 DIAGNOSIS — I10 Essential (primary) hypertension: Secondary | ICD-10-CM | POA: Insufficient documentation

## 2013-12-20 DIAGNOSIS — J4489 Other specified chronic obstructive pulmonary disease: Secondary | ICD-10-CM | POA: Insufficient documentation

## 2013-12-20 DIAGNOSIS — I252 Old myocardial infarction: Secondary | ICD-10-CM | POA: Insufficient documentation

## 2013-12-20 MED ORDER — TECHNETIUM TC 99M SESTAMIBI GENERIC - CARDIOLITE
30.0000 | Freq: Once | INTRAVENOUS | Status: AC | PRN
  Administered 2013-12-20: 30 via INTRAVENOUS

## 2013-12-20 MED ORDER — REGADENOSON 0.4 MG/5ML IV SOLN
0.4000 mg | Freq: Once | INTRAVENOUS | Status: AC
  Administered 2013-12-20: 0.4 mg via INTRAVENOUS

## 2013-12-20 NOTE — Progress Notes (Signed)
[facility] SITE [facility] NUCLEAR MED [address] [place], [place] [address] [phone]    Cardiology Nuclear Med Study  [name] is a 80 y.o. female     MRN : [id number]     DOB: 1933/05/20  Procedure Date: 12/20/2013  Nuclear Med Background Indication for Stress Test:  Evaluation for Ischemia and Stent Patency History:  CAD, MI, Cath, Stent to RCA, Echo 2010 EF 60%, MPI 2011 (normal) EF 78%, Asthma, Emphysema, COPD Cardiac Risk Factors: Carotid Disease, Hypertension, Lipids, NIDDM and Smoker  Symptoms:  Non indicated   Nuclear Pre-Procedure Caffeine/Decaff Intake:  None > 12 hrs NPO After: 7:30pm   Lungs:  clear O2 Sat: 95% on room air. IV 0.9% NS with Angio Cath:  22g  IV Site: R Wrist x 1, tolerated well IV Started by:  Irean HongPatsy Edwards, RN  Chest Size (in):  42 Cup Size: D  Height: 5\' 2"  (1.575 m)  Weight:  209 lb (94.802 kg)  BMI:  Body mass index is 38.22 kg/(m^2). Tech Comments:  Took Toprol this am    Nuclear Med Study 1 or 2 day study: 2 day  Stress Test Type:  Lexiscan  Reading MD: N/A  Order Authorizing Provider:  Verne Carrowhristopher McAlhany, MD  Resting Radionuclide: Technetium 6279m Sestamibi  Resting Radionuclide Dose: 33.0 mCi on 12/15/13   Stress Radionuclide:  Technetium 7779m Sestamibi  Stress Radionuclide Dose: 33.0 mCi on 12/20/13           Stress Protocol Rest HR: 62 Stress HR: 86  Rest BP: 171/79 Stress BP: 138/79  Exercise Time (min): n/a METS: n/a           Dose of Adenosine (mg):  n/a Dose of Lexiscan: 0.4 mg  Dose of Atropine (mg): n/a Dose of Dobutamine: n/a mcg/kg/min (at max HR)  Stress Test Technologist: Nelson ChimesSharon Brooks, BS-ES  Nuclear Technologist:  Domenic PoliteStephen Carbone, CNMT     Rest Procedure:  Myocardial perfusion imaging was performed at rest 45 minutes following the intravenous administration of Technetium 3479m Sestamibi. Rest ECG: NSR - Normal EKG  Stress Procedure:  The patient received IV Lexiscan 0.4 mg over 15-seconds.  Technetium  7779m Sestamibi injected at 30-seconds.  Quantitative spect images were obtained after a 45 minute delay.  During the infusion, the patient complained of slight SOB.  This resolved in recovery.  Stress ECG: No significant change from baseline ECG  QPS Raw Data Images:  Normal; no motion artifact; normal heart/lung ratio. Stress Images:  Normal homogeneous uptake in all areas of the myocardium. Rest Images:  Normal homogeneous uptake in all areas of the myocardium. Subtraction (SDS):  No evidence of ischemia. Transient Ischemic Dilatation (Normal <1.22):  1.08 Lung/Heart Ratio (Normal <0.45):  0.29  Quantitative Gated Spect Images QGS EDV:  58 ml QGS ESV:  10 ml  Impression Exercise Capacity:  Lexiscan with no exercise. BP Response:  Normal blood pressure response. Clinical Symptoms:  No significant symptoms noted. ECG Impression:  No significant ST segment change suggestive of ischemia. Comparison with Prior Nuclear Study: No significant change from previous study  Overall Impression:  Normal stress nuclear study.  LV Ejection Fraction: 82%.  LV Wall Motion:  NL LV Function; NL Wall Motion  Limited Brandshomas Swayzie Choate

## 2014-01-13 DIAGNOSIS — H04129 Dry eye syndrome of unspecified lacrimal gland: Secondary | ICD-10-CM | POA: Diagnosis not present

## 2014-01-13 DIAGNOSIS — H2510 Age-related nuclear cataract, unspecified eye: Secondary | ICD-10-CM | POA: Diagnosis not present

## 2014-01-13 DIAGNOSIS — H402290 Chronic angle-closure glaucoma, unspecified eye, stage unspecified: Secondary | ICD-10-CM | POA: Diagnosis not present

## 2014-01-13 DIAGNOSIS — H40039 Anatomical narrow angle, unspecified eye: Secondary | ICD-10-CM | POA: Diagnosis not present

## 2014-01-20 ENCOUNTER — Encounter: Payer: Self-pay | Admitting: Internal Medicine

## 2014-01-20 ENCOUNTER — Ambulatory Visit (INDEPENDENT_AMBULATORY_CARE_PROVIDER_SITE_OTHER): Payer: Medicare Other | Admitting: Internal Medicine

## 2014-01-20 ENCOUNTER — Other Ambulatory Visit (INDEPENDENT_AMBULATORY_CARE_PROVIDER_SITE_OTHER): Payer: Medicare Other

## 2014-01-20 VITALS — BP 142/80 | HR 76 | Temp 99.0°F | Wt 212.4 lb

## 2014-01-20 DIAGNOSIS — E119 Type 2 diabetes mellitus without complications: Secondary | ICD-10-CM | POA: Diagnosis not present

## 2014-01-20 DIAGNOSIS — Z79899 Other long term (current) drug therapy: Secondary | ICD-10-CM

## 2014-01-20 DIAGNOSIS — Z0000 Encounter for general adult medical examination without abnormal findings: Secondary | ICD-10-CM | POA: Diagnosis not present

## 2014-01-20 DIAGNOSIS — I10 Essential (primary) hypertension: Secondary | ICD-10-CM

## 2014-01-20 DIAGNOSIS — E669 Obesity, unspecified: Secondary | ICD-10-CM | POA: Insufficient documentation

## 2014-01-20 DIAGNOSIS — I2510 Atherosclerotic heart disease of native coronary artery without angina pectoris: Secondary | ICD-10-CM | POA: Diagnosis not present

## 2014-01-20 DIAGNOSIS — J449 Chronic obstructive pulmonary disease, unspecified: Secondary | ICD-10-CM | POA: Diagnosis not present

## 2014-01-20 LAB — MICROALBUMIN / CREATININE URINE RATIO
CREATININE, U: 63 mg/dL
MICROALB UR: 3.3 mg/dL — AB (ref 0.0–1.9)
MICROALB/CREAT RATIO: 5.2 mg/g (ref 0.0–30.0)

## 2014-01-20 LAB — LIPID PANEL
Cholesterol: 237 mg/dL — ABNORMAL HIGH (ref 0–200)
HDL: 54.1 mg/dL (ref >39.00)
TRIGLYCERIDES: 258 mg/dL — AB (ref 0.0–149.0)
Total CHOL/HDL Ratio: 4
VLDL: 51.6 mg/dL — ABNORMAL HIGH (ref 0.0–40.0)

## 2014-01-20 LAB — HEPATIC FUNCTION PANEL
ALT: 19 U/L (ref 0–35)
AST: 18 U/L (ref 0–37)
Albumin: 4.1 g/dL (ref 3.5–5.2)
Alkaline Phosphatase: 72 U/L (ref 39–117)
BILIRUBIN DIRECT: 0.1 mg/dL (ref 0.0–0.3)
BILIRUBIN TOTAL: 0.8 mg/dL (ref 0.3–1.2)
Total Protein: 7.2 g/dL (ref 6.0–8.3)

## 2014-01-20 LAB — LDL CHOLESTEROL, DIRECT: Direct LDL: 153.2 mg/dL

## 2014-01-20 LAB — HEMOGLOBIN A1C: Hgb A1c MFr Bld: 6.4 % (ref 4.6–6.5)

## 2014-01-20 NOTE — Progress Notes (Signed)
Subjective:    Patient ID: Jenny [name], female    DOB: Nov 26, 1933, 80 y.o.   MRN: [id number]  HPI   Here for medicare wellness  Diet: heart healthy or DM if diabetic Physical activity: sedentary Depression/mood screen: negative Hearing: intact to whispered voice Visual acuity: grossly normal, performs annual eye exam  ADLs: capable Fall risk: none Home safety: good Cognitive evaluation: intact to orientation, naming, recall and repetition EOL planning: adv directives, full code/ I agree  I have personally reviewed and have noted 1. The patient's medical and social history 2. Their use of alcohol, tobacco or illicit drugs 3. Their current medications and supplements 4. The patient's functional ability including ADL's, fall risks, home safety risks and hearing or visual impairment. 5. Diet and physical activities 6. Evidence for depression or mood disorders  Also reviewed chronic med issues and interval medical events:  CAD s/p MI 06/2009 - stent to RCA no chest pain or anginal symptoms since stent  followed by [facility] for same back on ASA/Plavix despite UGI 2010 b/c DES was so new (<29mos at time of dx) myoview 03/31/10 negative  COPD - has been cutting back on smoking since MI 2010- down to 4-6 cig/day - prev 2ppd; Declines med cessation assistance; dyspnea with exertion improved since starting LABA/ICS; triggers for smoking include family stressors  dyslipidemia - on simva 40, intol of atorva and prava trials in past - no recent changes in medication dose or frequency. denies adverse side effects related to current therapy.   DM2, diet controlled - dx by a1c 05/2010 in setting of hyperglycemia and weight gain -has worked on diet and weight loss - not checking cbgs - following low carb diet  hx GIB/DU 09/2009- hosp for UGIB related to DU with vv (s/p EGD Elnoria Howard): cauterized and clipped); neg antibody for h. pylori so no abx assoc with ABL anemia - but no need for transfusion  during hosp No longer taking PPI -no recurrent abd pain symptoms and no nausea or vomitting  generally avoiding NSAIDs   Past Medical History  Diagnosis Date  . TOBACCO ABUSE   . CAD (coronary artery disease) 06/2009    DEs RCA with 70% LAD and EF 60%  . ANEMIA-NOS   . GLAUCOMA   . HYPERLIPIDEMIA   . HYPERTENSION, BENIGN   . COPD     mild obst on PFTs 03/2010  . Diabetes mellitus 06/2010 dx    Mild, diet controlled  . ACUT DUOD ULCER W/HEMORR&PERF W/O MENTION OBST 10/05/2009    NSAID induced  . MYOCARDIAL INFARCTION 06/2009    des to rca  . ALLERGIC RHINITIS CAUSE UNSPECIFIED    No family history on file. History  Substance Use Topics  . Smoking status: Current Every Day Smoker -- 0.50 packs/day for 60 years  . Smokeless tobacco: Never Used     Comment: She lives in [place] with her significant other (Charles Hook)0  . Alcohol Use: Yes     Comment: Occassional    Review of Systems  Constitutional: Positive for fatigue. Negative for unexpected weight change.  HENT: Negative for facial swelling.   Respiratory: Negative for cough, choking, shortness of breath and wheezing.   Cardiovascular: Negative for chest pain, palpitations and leg swelling.  Gastrointestinal: Negative for nausea, abdominal pain and diarrhea.  Musculoskeletal: Negative for neck pain.  Neurological: Negative for dizziness, weakness, light-headedness and headaches.  Psychiatric/Behavioral: Negative for dysphoric mood. The patient is not nervous/anxious.   All other systems reviewed and  are negative.       Objective:   Physical Exam BP 142/80  Pulse 76  Temp(Src) 99 F (37.2 C) (Oral)  Wt 212 lb 6.4 oz (96.344 kg)  SpO2 95% Wt Readings from Last 3 Encounters:  01/20/14 212 lb 6.4 oz (96.344 kg)  12/20/13 209 lb (94.802 kg)  10/19/13 215 lb (97.523 kg)   Constitutional: She is overweight. She appears well-developed and well-nourished. No distress.  Neck: Normal range of motion. Neck supple. No JVD  present. No thyromegaly present.  Cardiovascular: Normal rate, regular rhythm and normal heart sounds.  No murmur heard. No BLE edema. Pulmonary/Chest: no increased effort at rest; breath sounds diminished at bases -  Few B scattered rhonci. No respiratory distress - no wheezes. Skin: no rash or erythema - solar changes Psychiatric: She has a normal mood and affect. Her behavior is normal. Judgment and thought content normal.       Lab Results  Component Value Date   WBC 6.4 01/20/2013   HGB 16.6* 01/20/2013   HCT 48.0* 01/20/2013   PLT 234.0 01/20/2013   CHOL 228* 07/20/2013   TRIG 321.0* 07/20/2013   HDL 47.90 07/20/2013   LDLDIRECT 137.9 07/20/2013   ALT 21 03/16/2012   AST 17 03/16/2012   NA 137 07/20/2013   K 4.1 07/20/2013   CL 102 07/20/2013   CREATININE 0.8 07/20/2013   BUN 13 07/20/2013   CO2 29 07/20/2013   TSH 1.34 05/21/2010   INR 1.07 10/06/2009   HGBA1C 6.4 07/20/2013     Assessment & Plan:   AWV/v70.0 - Today patient counseled on age appropriate routine health concerns for screening and prevention, each reviewed and up to date or declined. Immunizations reviewed and up to date or declined. Labs ordered and reviewed. Risk factors for depression reviewed and negative. Hearing function and visual acuity are intact. ADLs screened and addressed as needed. Functional ability and level of safety reviewed and appropriate. Education, counseling and referrals performed based on assessed risks today. Patient provided with a copy of personalized plan for preventive services.  Problem List Items Addressed This Visit   COPD     Changed advair to formulary approved symbicort 12/2012 Change back to Advair per formulary now (12/2013) - erx done Continue Alb prn At baseline symptoms today    HYPERTENSION, BENIGN      Elevated BP today - same on manual recheck on beta-blocker, ARB, HCTZ Reminded pt to monitor at home and call if SBP>140 for med adjustments as needed  BP Readings from Last 3  Encounters:  01/20/14 142/80  12/20/13 171/79  10/19/13 128/82      Relevant Orders      Lipid panel   Type 2 diabetes, diet controlled      Diet controlled DM, previously controlled with weight loss On ARB, statin and ASA 81 Weight trends reviewed - check a1c now  Lab Results  Component Value Date   HGBA1C 6.4 07/20/2013      Relevant Orders      Hemoglobin A1c      Microalbumin / creatinine urine ratio      Lipid panel    Other Visit Diagnoses   Routine general medical examination at a health care facility    -  Primary    Encounter for long-term (current) use of other medications        Relevant Orders       Hepatic function panel

## 2014-01-20 NOTE — Assessment & Plan Note (Signed)
Elevated BP today - same on manual recheck on beta-blocker, ARB, HCTZ Reminded pt to monitor at home and call if SBP>140 for med adjustments as needed  BP Readings from Last 3 Encounters:  01/20/14 142/80  12/20/13 171/79  10/19/13 128/82

## 2014-01-20 NOTE — Assessment & Plan Note (Signed)
Diet controlled DM, previously controlled with weight loss On ARB, statin and ASA 81 Weight trends reviewed - check a1c now  Lab Results  Component Value Date   HGBA1C 6.4 07/20/2013

## 2014-01-20 NOTE — Patient Instructions (Addendum)
It was good to see you today.  Medications reviewed, change Symbicort back to Advair - no other changes Your prescription(s) have been given to you to submit to your pharmacy. Please take as directed and contact our office if you believe you are having problem(s) with the medication(s).  Test(s) ordered today. Your results will be released to Winifred (or called to you) after review, usually within 72hours after test completion. If any changes need to be made, you will be notified at that same time.  Please schedule followup in 6 months for diabetes, cholesterol and blood pressure check, call sooner if problems.  Diabetes and Standards of Medical Care  Diabetes is complicated. You may find that your diabetes team includes a dietitian, nurse, diabetes educator, eye doctor, and more. To help everyone know what is going on and to help you get the care you deserve, the following schedule of care was developed to help keep you on track. Below are the tests, exams, vaccines, medicines, education, and plans you will need. HbA1c test This test shows how well you have controlled your glucose over the past 2 3 months. It is used to see if your diabetes management plan needs to be adjusted.   It is performed at least 2 times a year if you are meeting treatment goals.  It is performed 4 times a year if therapy has changed or if you are not meeting treatment goals. Blood pressure test  This test is performed at every routine medical visit. The goal is less than 140/90 mmHg for most people, but 130/80 mmHg in some cases. Ask your health care provider about your goal. Dental exam  Follow up with the dentist regularly. Eye exam  If you are diagnosed with type 1 diabetes as a child, get an exam upon reaching the age of 59 years or older and have had diabetes for 3 5 years. Yearly eye exams are recommended after that initial eye exam.  If you are diagnosed with type 1 diabetes as an adult, get an exam within  5 years of diagnosis and then yearly.  If you are diagnosed with type 2 diabetes, get an exam as soon as possible after the diagnosis and then yearly. Foot care exam  Visual foot exams are performed at every routine medical visit. The exams check for cuts, injuries, or other problems with the feet.  A comprehensive foot exam should be done yearly. This includes visual inspection as well as assessing foot pulses and testing for loss of sensation.  Check your feet nightly for cuts, injuries, or other problems with your feet. Tell your health care provider if anything is not healing. Kidney function test (urine microalbumin)  This test is performed once a year.  Type 1 diabetes: The first test is performed 5 years after diagnosis.  Type 2 diabetes: The first test is performed at the time of diagnosis.  A serum creatinine and estimated glomerular filtration rate (eGFR) test is done once a year to assess the level of chronic kidney disease (CKD), if present. Lipid profile (cholesterol, HDL, LDL, triglycerides)  Performed every 5 years for most people.  The goal for LDL is less than 100 mg/dL. If you are at high risk, the goal is less than 70 mg/dL.  The goal for HDL is 40 mg/dL 50 mg/dL for men and 50 mg/dL 60 mg/dL for women. An HDL cholesterol of 60 mg/dL or higher gives some protection against heart disease.  The goal for triglycerides is less  than 150 mg/dL. Influenza vaccine, pneumococcal vaccine, and hepatitis B vaccine  The influenza vaccine is recommended yearly.  The pneumococcal vaccine is generally given once in a lifetime. However, there are some instances when another vaccination is recommended. Check with your health care provider.  The hepatitis B vaccine is also recommended for adults with diabetes. Diabetes self-management education  Education is recommended at diagnosis and ongoing as needed. Treatment plan  Your treatment plan is reviewed at every medical  visit. Document Released: 09/22/2009 Document Revised: 07/28/2013 Document Reviewed: 04/27/2013 [facility] Patient Information 2014 [place]. Health Maintenance, Female A healthy lifestyle and preventative care can promote health and wellness.  Maintain regular health, dental, and eye exams.  Eat a healthy diet. Foods like vegetables, fruits, whole grains, low-fat dairy products, and lean protein foods contain the nutrients you need without too many calories. Decrease your intake of foods high in solid fats, added sugars, and salt. Get information about a proper diet from your caregiver, if necessary.  Regular physical exercise is one of the most important things you can do for your health. Most adults should get at least 150 minutes of moderate-intensity exercise (any activity that increases your heart rate and causes you to sweat) each week. In addition, most adults need muscle-strengthening exercises on 2 or more days a week.   Maintain a healthy weight. The body mass index (BMI) is a screening tool to identify possible weight problems. It provides an estimate of body fat based on height and weight. Your caregiver can help determine your BMI, and can help you achieve or maintain a healthy weight. For adults 20 years and older:  A BMI below 18.5 is considered underweight.  A BMI of 18.5 to 24.9 is normal.  A BMI of 25 to 29.9 is considered overweight.  A BMI of 30 and above is considered obese.  Maintain normal blood lipids and cholesterol by exercising and minimizing your intake of saturated fat. Eat a balanced diet with plenty of fruits and vegetables. Blood tests for lipids and cholesterol should begin at age 28 and be repeated every 5 years. If your lipid or cholesterol levels are high, you are over 50, or you are a high risk for heart disease, you may need your cholesterol levels checked more frequently.Ongoing high lipid and cholesterol levels should be treated with medicines if  diet and exercise are not effective.  If you smoke, find out from your caregiver how to quit. If you do not use tobacco, do not start.  Lung cancer screening is recommended for adults aged 56 80 years who are at high risk for developing lung cancer because of a history of smoking. Yearly low-dose computed tomography (CT) is recommended for people who have at least a 30-pack-year history of smoking and are a current smoker or have quit within the past 15 years. A pack year of smoking is smoking an average of 1 pack of cigarettes a day for 1 year (for example: 1 pack a day for 30 years or 2 packs a day for 15 years). Yearly screening should continue until the smoker has stopped smoking for at least 15 years. Yearly screening should also be stopped for people who develop a health problem that would prevent them from having lung cancer treatment.  If you are pregnant, do not drink alcohol. If you are breastfeeding, be very cautious about drinking alcohol. If you are not pregnant and choose to drink alcohol, do not exceed 1 drink per day. One drink  is considered to be 12 ounces (355 mL) of beer, 5 ounces (148 mL) of wine, or 1.5 ounces (44 mL) of liquor.  Avoid use of street drugs. Do not share needles with anyone. Ask for help if you need support or instructions about stopping the use of drugs.  High blood pressure causes heart disease and increases the risk of stroke. Blood pressure should be checked at least every 1 to 2 years. Ongoing high blood pressure should be treated with medicines, if weight loss and exercise are not effective.  If you are 74 to 78 years old, ask your caregiver if you should take aspirin to prevent strokes.  Diabetes screening involves taking a blood sample to check your fasting blood sugar level. This should be done once every 3 years, after age 55, if you are within normal weight and without risk factors for diabetes. Testing should be considered at a younger age or be carried  out more frequently if you are overweight and have at least 1 risk factor for diabetes.  Breast cancer screening is essential preventative care for women. You should practice "breast self-awareness." This means understanding the normal appearance and feel of your breasts and may include breast self-examination. Any changes detected, no matter how small, should be reported to a caregiver. Women in their 78s and 30s should have a clinical breast exam (CBE) by a caregiver as part of a regular health exam every 1 to 3 years. After age 22, women should have a CBE every year. Starting at age 46, women should consider having a mammogram (breast X-ray) every year. Women who have a family history of breast cancer should talk to their caregiver about genetic screening. Women at a high risk of breast cancer should talk to their caregiver about having an MRI and a mammogram every year.  Breast cancer gene (BRCA)-related cancer risk assessment is recommended for women who have family members with BRCA-related cancers. BRCA-related cancers include breast, ovarian, tubal, and peritoneal cancers. Having family members with these cancers may be associated with an increased risk for harmful changes (mutations) in the breast cancer genes BRCA1 and BRCA2. Results of the assessment will determine the need for genetic counseling and BRCA1 and BRCA2 testing.  The Pap test is a screening test for cervical cancer. Women should have a Pap test starting at age 75. Between ages 12 and 49, Pap tests should be repeated every 2 years. Beginning at age 33, you should have a Pap test every 3 years as long as the past 3 Pap tests have been normal. If you had a hysterectomy for a problem that was not cancer or a condition that could lead to cancer, then you no longer need Pap tests. If you are between ages 84 and 12, and you have had normal Pap tests going back 10 years, you no longer need Pap tests. If you have had past treatment for cervical  cancer or a condition that could lead to cancer, you need Pap tests and screening for cancer for at least 20 years after your treatment. If Pap tests have been discontinued, risk factors (such as a new sexual partner) need to be reassessed to determine if screening should be resumed. Some women have medical problems that increase the chance of getting cervical cancer. In these cases, your caregiver may recommend more frequent screening and Pap tests.  The human papillomavirus (HPV) test is an additional test that may be used for cervical cancer screening. The HPV test looks for  the virus that can cause the cell changes on the cervix. The cells collected during the Pap test can be tested for HPV. The HPV test could be used to screen women aged 63 years and older, and should be used in women of any age who have unclear Pap test results. After the age of 76, women should have HPV testing at the same frequency as a Pap test.  Colorectal cancer can be detected and often prevented. Most routine colorectal cancer screening begins at the age of 29 and continues through age 67. However, your caregiver may recommend screening at an earlier age if you have risk factors for colon cancer. On a yearly basis, your caregiver may provide home test kits to check for hidden blood in the stool. Use of a small camera at the end of a tube, to directly examine the colon (sigmoidoscopy or colonoscopy), can detect the earliest forms of colorectal cancer. Talk to your caregiver about this at age 46, when routine screening begins. Direct examination of the colon should be repeated every 5 to 10 years through age 52, unless early forms of pre-cancerous polyps or small growths are found.  Hepatitis C blood testing is recommended for all people born from [birth year] through 1963 and any individual with known risks for hepatitis C.  Practice safe sex. Use condoms and avoid high-risk sexual practices to reduce the spread of sexually transmitted  infections (STIs). Sexually active women aged 68 and younger should be checked for Chlamydia, which is a common sexually transmitted infection. Older women with new or multiple partners should also be tested for Chlamydia. Testing for other STIs is recommended if you are sexually active and at increased risk.  Osteoporosis is a disease in which the bones lose minerals and strength with aging. This can result in serious bone fractures. The risk of osteoporosis can be identified using a bone density scan. Women ages 73 and over and women at risk for fractures or osteoporosis should discuss screening with their caregivers. Ask your caregiver whether you should be taking a calcium supplement or vitamin D to reduce the rate of osteoporosis.  Menopause can be associated with physical symptoms and risks. Hormone replacement therapy is available to decrease symptoms and risks. You should talk to your caregiver about whether hormone replacement therapy is right for you.  Use sunscreen. Apply sunscreen liberally and repeatedly throughout the day. You should seek shade when your shadow is shorter than you. Protect yourself by wearing long sleeves, pants, a wide-brimmed hat, and sunglasses year round, whenever you are outdoors.  Notify your caregiver of new moles or changes in moles, especially if there is a change in shape or color. Also notify your caregiver if a mole is larger than the size of a pencil eraser.  Stay current with your immunizations. Document Released: 06/10/2011 Document Revised: 03/22/2013 Document Reviewed: 06/10/2011 [facility] Patient Information 2014 [place].

## 2014-01-20 NOTE — Assessment & Plan Note (Signed)
Wt Readings from Last 3 Encounters:  01/20/14 212 lb 6.4 oz (96.344 kg)  12/20/13 209 lb (94.802 kg)  10/19/13 215 lb (97.523 kg)   The patient is asked to make an attempt to improve diet and exercise patterns to aid in medical management of this problem.

## 2014-01-20 NOTE — Assessment & Plan Note (Signed)
Changed advair to formulary approved symbicort 12/2012 Change back to Advair per formulary now (12/2013) - erx done Continue Alb prn At baseline symptoms today

## 2014-01-20 NOTE — Progress Notes (Signed)
Pre-visit discussion using our clinic review tool. No additional management support is needed unless otherwise documented below in the visit note.  

## 2014-01-24 ENCOUNTER — Other Ambulatory Visit: Payer: Self-pay | Admitting: Internal Medicine

## 2014-01-27 ENCOUNTER — Telehealth: Payer: Self-pay | Admitting: Internal Medicine

## 2014-01-27 NOTE — Telephone Encounter (Signed)
Relevant patient education assigned to patient using Emmi. ° °

## 2014-02-10 ENCOUNTER — Other Ambulatory Visit: Payer: Self-pay | Admitting: Internal Medicine

## 2014-02-10 ENCOUNTER — Telehealth: Payer: Self-pay | Admitting: *Deleted

## 2014-02-10 NOTE — Telephone Encounter (Signed)
Pharmacist phoned requesting refill order for Advair.  Upon review of MAR hx, symbicort was ordered in lieu of advair in 12/2012.

## 2014-02-23 ENCOUNTER — Other Ambulatory Visit: Payer: Self-pay | Admitting: Internal Medicine

## 2014-05-03 ENCOUNTER — Telehealth: Payer: Self-pay | Admitting: Internal Medicine

## 2014-05-03 DIAGNOSIS — M5030 Other cervical disc degeneration, unspecified cervical region: Secondary | ICD-10-CM | POA: Diagnosis not present

## 2014-05-03 DIAGNOSIS — M999 Biomechanical lesion, unspecified: Secondary | ICD-10-CM | POA: Diagnosis not present

## 2014-05-03 DIAGNOSIS — M62838 Other muscle spasm: Secondary | ICD-10-CM | POA: Diagnosis not present

## 2014-05-03 DIAGNOSIS — M9981 Other biomechanical lesions of cervical region: Secondary | ICD-10-CM | POA: Diagnosis not present

## 2014-05-03 MED ORDER — FLUTICASONE-SALMETEROL 100-50 MCG/DOSE IN AEPB: 1.0000 | INHALATION_SPRAY | Freq: Two times a day (BID) | RESPIRATORY_TRACT | Status: DC

## 2014-05-03 NOTE — Telephone Encounter (Signed)
Called pt back she stated that her insurance no longer covers symbicort, the alternative will be Advair...Raechel Chute

## 2014-05-03 NOTE — Telephone Encounter (Signed)
Thank you for the note It is best practice to see a provider for the ear concern, I apologize for not having availability to do so myself at time pt prefers -  Re: costly med, please ask about which med and for her formulary alternates (per her insurance) and I will be happy to change to alternate med

## 2014-05-03 NOTE — Telephone Encounter (Signed)
Pt called early this am for an appt with Dr. Felicity Coyer. At that time I offered an 8:30.  She declined due to travel time.  I offered an appt with Dr. Alwyn Ren.  She declined stating she didn't want to see a new doctor and hung up.  She called back.  No appts this week with Dr. Felicity Coyer.  She states she is having problems with an ear.  Also having problems with one of her meds not being covered by insurance.  Again she was offered appointments today with Dr. Alwyn Ren.  She declined and said she will be looking for a new doctor.

## 2014-05-03 NOTE — Telephone Encounter (Signed)
Changed symbicort to advair as requested - erx done thanks

## 2014-05-05 DIAGNOSIS — M999 Biomechanical lesion, unspecified: Secondary | ICD-10-CM | POA: Diagnosis not present

## 2014-05-05 DIAGNOSIS — M5030 Other cervical disc degeneration, unspecified cervical region: Secondary | ICD-10-CM | POA: Diagnosis not present

## 2014-05-05 DIAGNOSIS — M62838 Other muscle spasm: Secondary | ICD-10-CM | POA: Diagnosis not present

## 2014-05-05 DIAGNOSIS — M9981 Other biomechanical lesions of cervical region: Secondary | ICD-10-CM | POA: Diagnosis not present

## 2014-05-11 DIAGNOSIS — M62838 Other muscle spasm: Secondary | ICD-10-CM | POA: Diagnosis not present

## 2014-05-11 DIAGNOSIS — M9981 Other biomechanical lesions of cervical region: Secondary | ICD-10-CM | POA: Diagnosis not present

## 2014-05-11 DIAGNOSIS — M5030 Other cervical disc degeneration, unspecified cervical region: Secondary | ICD-10-CM | POA: Diagnosis not present

## 2014-05-11 DIAGNOSIS — M999 Biomechanical lesion, unspecified: Secondary | ICD-10-CM | POA: Diagnosis not present

## 2014-06-30 DIAGNOSIS — M5030 Other cervical disc degeneration, unspecified cervical region: Secondary | ICD-10-CM | POA: Diagnosis not present

## 2014-06-30 DIAGNOSIS — M999 Biomechanical lesion, unspecified: Secondary | ICD-10-CM | POA: Diagnosis not present

## 2014-06-30 DIAGNOSIS — M9981 Other biomechanical lesions of cervical region: Secondary | ICD-10-CM | POA: Diagnosis not present

## 2014-06-30 DIAGNOSIS — M62838 Other muscle spasm: Secondary | ICD-10-CM | POA: Diagnosis not present

## 2014-07-06 DIAGNOSIS — I252 Old myocardial infarction: Secondary | ICD-10-CM | POA: Diagnosis not present

## 2014-07-06 DIAGNOSIS — F17200 Nicotine dependence, unspecified, uncomplicated: Secondary | ICD-10-CM | POA: Diagnosis not present

## 2014-07-06 DIAGNOSIS — Z23 Encounter for immunization: Secondary | ICD-10-CM | POA: Diagnosis not present

## 2014-07-06 DIAGNOSIS — I10 Essential (primary) hypertension: Secondary | ICD-10-CM | POA: Diagnosis not present

## 2014-07-06 DIAGNOSIS — J449 Chronic obstructive pulmonary disease, unspecified: Secondary | ICD-10-CM | POA: Diagnosis not present

## 2014-07-06 DIAGNOSIS — I2510 Atherosclerotic heart disease of native coronary artery without angina pectoris: Secondary | ICD-10-CM | POA: Diagnosis not present

## 2014-07-20 ENCOUNTER — Ambulatory Visit: Payer: Medicare Other | Admitting: Internal Medicine

## 2014-08-04 DIAGNOSIS — H04129 Dry eye syndrome of unspecified lacrimal gland: Secondary | ICD-10-CM | POA: Diagnosis not present

## 2014-08-04 DIAGNOSIS — H40039 Anatomical narrow angle, unspecified eye: Secondary | ICD-10-CM | POA: Diagnosis not present

## 2014-08-04 DIAGNOSIS — H402290 Chronic angle-closure glaucoma, unspecified eye, stage unspecified: Secondary | ICD-10-CM | POA: Diagnosis not present

## 2014-08-04 DIAGNOSIS — H33059 Total retinal detachment, unspecified eye: Secondary | ICD-10-CM | POA: Diagnosis not present

## 2014-08-04 DIAGNOSIS — H2510 Age-related nuclear cataract, unspecified eye: Secondary | ICD-10-CM | POA: Diagnosis not present

## 2014-09-01 DIAGNOSIS — R079 Chest pain, unspecified: Secondary | ICD-10-CM | POA: Diagnosis not present

## 2014-09-01 DIAGNOSIS — R0609 Other forms of dyspnea: Secondary | ICD-10-CM | POA: Diagnosis not present

## 2014-09-01 DIAGNOSIS — R609 Edema, unspecified: Secondary | ICD-10-CM | POA: Diagnosis not present

## 2014-09-01 DIAGNOSIS — M7020 Olecranon bursitis, unspecified elbow: Secondary | ICD-10-CM | POA: Diagnosis not present

## 2014-09-01 DIAGNOSIS — R0989 Other specified symptoms and signs involving the circulatory and respiratory systems: Secondary | ICD-10-CM | POA: Diagnosis not present

## 2014-09-01 DIAGNOSIS — Z23 Encounter for immunization: Secondary | ICD-10-CM | POA: Diagnosis not present

## 2014-09-05 DIAGNOSIS — R404 Transient alteration of awareness: Secondary | ICD-10-CM | POA: Diagnosis not present

## 2014-09-05 DIAGNOSIS — I2510 Atherosclerotic heart disease of native coronary artery without angina pectoris: Secondary | ICD-10-CM | POA: Diagnosis not present

## 2014-09-05 DIAGNOSIS — R609 Edema, unspecified: Secondary | ICD-10-CM | POA: Diagnosis not present

## 2014-09-05 DIAGNOSIS — J449 Chronic obstructive pulmonary disease, unspecified: Secondary | ICD-10-CM | POA: Diagnosis not present

## 2014-09-12 DIAGNOSIS — Z0000 Encounter for general adult medical examination without abnormal findings: Secondary | ICD-10-CM | POA: Diagnosis not present

## 2014-09-12 DIAGNOSIS — R5383 Other fatigue: Secondary | ICD-10-CM | POA: Diagnosis not present

## 2014-09-12 DIAGNOSIS — J449 Chronic obstructive pulmonary disease, unspecified: Secondary | ICD-10-CM | POA: Diagnosis not present

## 2014-09-12 DIAGNOSIS — Z79899 Other long term (current) drug therapy: Secondary | ICD-10-CM | POA: Diagnosis not present

## 2014-09-12 DIAGNOSIS — E782 Mixed hyperlipidemia: Secondary | ICD-10-CM | POA: Diagnosis not present

## 2014-09-12 DIAGNOSIS — Z1389 Encounter for screening for other disorder: Secondary | ICD-10-CM | POA: Diagnosis not present

## 2014-09-12 DIAGNOSIS — F17210 Nicotine dependence, cigarettes, uncomplicated: Secondary | ICD-10-CM | POA: Diagnosis not present

## 2014-09-12 DIAGNOSIS — R7301 Impaired fasting glucose: Secondary | ICD-10-CM | POA: Diagnosis not present

## 2014-09-12 DIAGNOSIS — I10 Essential (primary) hypertension: Secondary | ICD-10-CM | POA: Diagnosis not present

## 2014-09-12 DIAGNOSIS — R609 Edema, unspecified: Secondary | ICD-10-CM | POA: Diagnosis not present

## 2014-09-23 DIAGNOSIS — M5032 Other cervical disc degeneration, mid-cervical region: Secondary | ICD-10-CM | POA: Diagnosis not present

## 2014-09-23 DIAGNOSIS — M5136 Other intervertebral disc degeneration, lumbar region: Secondary | ICD-10-CM | POA: Diagnosis not present

## 2014-09-23 DIAGNOSIS — M9901 Segmental and somatic dysfunction of cervical region: Secondary | ICD-10-CM | POA: Diagnosis not present

## 2014-09-23 DIAGNOSIS — M9903 Segmental and somatic dysfunction of lumbar region: Secondary | ICD-10-CM | POA: Diagnosis not present

## 2014-09-27 DIAGNOSIS — M8580 Other specified disorders of bone density and structure, unspecified site: Secondary | ICD-10-CM | POA: Diagnosis not present

## 2014-09-30 DIAGNOSIS — J449 Chronic obstructive pulmonary disease, unspecified: Secondary | ICD-10-CM | POA: Diagnosis not present

## 2014-10-12 DIAGNOSIS — J441 Chronic obstructive pulmonary disease with (acute) exacerbation: Secondary | ICD-10-CM | POA: Diagnosis not present

## 2014-10-14 ENCOUNTER — Other Ambulatory Visit (HOSPITAL_COMMUNITY): Payer: Self-pay | Admitting: *Deleted

## 2014-10-14 DIAGNOSIS — I6523 Occlusion and stenosis of bilateral carotid arteries: Secondary | ICD-10-CM

## 2014-10-27 ENCOUNTER — Ambulatory Visit (HOSPITAL_COMMUNITY): Payer: Medicare Other | Attending: Internal Medicine | Admitting: Cardiology

## 2014-10-27 DIAGNOSIS — J449 Chronic obstructive pulmonary disease, unspecified: Secondary | ICD-10-CM | POA: Insufficient documentation

## 2014-10-27 DIAGNOSIS — I6523 Occlusion and stenosis of bilateral carotid arteries: Secondary | ICD-10-CM

## 2014-10-27 DIAGNOSIS — E119 Type 2 diabetes mellitus without complications: Secondary | ICD-10-CM | POA: Diagnosis not present

## 2014-10-27 DIAGNOSIS — I2510 Atherosclerotic heart disease of native coronary artery without angina pectoris: Secondary | ICD-10-CM | POA: Diagnosis not present

## 2014-10-27 DIAGNOSIS — Z720 Tobacco use: Secondary | ICD-10-CM | POA: Insufficient documentation

## 2014-10-27 DIAGNOSIS — E785 Hyperlipidemia, unspecified: Secondary | ICD-10-CM | POA: Diagnosis not present

## 2014-10-27 DIAGNOSIS — I10 Essential (primary) hypertension: Secondary | ICD-10-CM | POA: Insufficient documentation

## 2014-10-27 NOTE — Progress Notes (Signed)
Carotid duplex performed 

## 2014-11-11 ENCOUNTER — Ambulatory Visit (INDEPENDENT_AMBULATORY_CARE_PROVIDER_SITE_OTHER): Payer: Medicare Other | Admitting: Cardiovascular Disease

## 2014-11-11 ENCOUNTER — Encounter: Payer: Self-pay | Admitting: Cardiovascular Disease

## 2014-11-11 VITALS — BP 135/78 | HR 77 | Ht 62.0 in | Wt 219.0 lb

## 2014-11-11 DIAGNOSIS — I2510 Atherosclerotic heart disease of native coronary artery without angina pectoris: Secondary | ICD-10-CM | POA: Diagnosis not present

## 2014-11-11 DIAGNOSIS — Z720 Tobacco use: Secondary | ICD-10-CM

## 2014-11-11 DIAGNOSIS — E785 Hyperlipidemia, unspecified: Secondary | ICD-10-CM

## 2014-11-11 DIAGNOSIS — I779 Disorder of arteries and arterioles, unspecified: Secondary | ICD-10-CM | POA: Diagnosis not present

## 2014-11-11 DIAGNOSIS — I10 Essential (primary) hypertension: Secondary | ICD-10-CM | POA: Diagnosis not present

## 2014-11-11 DIAGNOSIS — I739 Peripheral vascular disease, unspecified: Secondary | ICD-10-CM

## 2014-11-11 NOTE — Progress Notes (Signed)
History of Present Illness: 80 yo WF with history of CAD, HTN, HLD, COPD and DM here today cardiac follow up. She has been followed in the past by Dr. Juanda Chance. In July of 2010 she had an inferior MI treated with a DES to the right coronary artery. She had residual 70% LAD stenosis and an ejection fraction of 60%. Stress myoview negative for ischemia April 2011.  She also has chronic shortness of breath and moderate chronic obstructive pulmonary disease by pulmonary function testing. She has continued to smoke despite advice against this.  Stress myoview 12/20/13 with no ischemia, normal LV function.   She has been feeling well. No chest pain. Breathing has been at baseline. No dizziness, near syncope or syncope. She has been widowed since 1993. She continues to smoke 1/2 ppd.   Primary Care Physician: Kirby Funk  Last Lipid Profile: Followed in primary care   Past Medical History  Diagnosis Date  . TOBACCO ABUSE   . CAD (coronary artery disease) 06/2009    DEs RCA with 70% LAD and EF 60%  . ANEMIA-NOS   . GLAUCOMA   . HYPERLIPIDEMIA   . HYPERTENSION, BENIGN   . COPD     mild obst on PFTs 03/2010  . Diabetes mellitus 06/2010 dx    Mild, diet controlled  . ACUT DUOD ULCER W/HEMORR&PERF W/O MENTION OBST 10/05/2009    NSAID induced  . MYOCARDIAL INFARCTION 06/2009    des to rca  . ALLERGIC RHINITIS CAUSE UNSPECIFIED     Past Surgical History  Procedure Laterality Date  . Right knee surgery    . Hemorrhoid surgery  1990    Current Outpatient Prescriptions  Medication Sig Dispense Refill  . aspirin 81 MG tablet Take 81 mg by mouth daily.      . clopidogrel (PLAVIX) 75 MG tablet TAKE ONE TABLET EVERY DAY 30 tablet 11  . dorzolamide (TRUSOPT) 2 % ophthalmic solution 1 drop 2 (two) times daily.      . famotidine (PEPCID) 20 MG tablet Take 20 mg by mouth 2 (two) times daily.    . Ferrous Sulfate Dried (FEOSOL) 200 (65 FE) MG TABS Take by mouth daily as needed.      . furosemide  (LASIX) 40 MG tablet Take 40 mg by mouth daily.    . hydrochlorothiazide (HYDRODIURIL) 25 MG tablet TAKE ONE TABLET BY MOUTH ONCE DAILY 30 tablet 11  . latanoprost (XALATAN) 0.005 % ophthalmic solution Place 1 drop into both eyes at bedtime.      Marland Kitchen losartan (COZAAR) 50 MG tablet Take 50 mg by mouth daily.    . magnesium hydroxide (MILK OF MAGNESIA) 400 MG/5ML suspension Take by mouth daily.      . metoprolol succinate (TOPROL-XL) 25 MG 24 hr tablet Take 25 mg by mouth daily.    Marland Kitchen NITROSTAT 0.4 MG SL tablet DISSOLVE ONE TABLET UNDER TONGUE AS NEEDED FOR CHEST PAIN - MAY REPEAT TWICE-IF NO RELIEF GO TO NEAREST HOSPITAL ER 25 tablet 0  . potassium chloride (K-DUR,KLOR-CON) 10 MEQ tablet Take 10 mEq by mouth daily.    . rosuvastatin (CRESTOR) 20 MG tablet Take 20 mg by mouth daily.    . Tiotropium Bromide Monohydrate (SPIRIVA RESPIMAT) 2.5 MCG/ACT AERS Inhale 2 puffs into the lungs daily.    . VENTOLIN HFA 108 (90 BASE) MCG/ACT inhaler INHALE ONE PUFF EVERY 6 HOURS AS NEEDED FOR SHORTNESS OF BREATH 18 g 2   No current facility-administered medications for this visit.  Allergies  Allergen Reactions  . Atorvastatin     Itching and myaliga  . Meperidine Hcl   . Pravastatin     rash    History   Social History  . Marital Status: Widowed    Spouse Name: N/A    Number of Children: N/A  . Years of Education: N/A   Occupational History  . Not on file.   Social History Main Topics  . Smoking status: Current Every Day Smoker -- 0.50 packs/day for 60 years  . Smokeless tobacco: Never Used     Comment: [place] her significant other (Charles Hook)0  . Alcohol Use: Yes     Comment: Occassional  . Drug Use: No  . Sexual Activity: Not on file   Other Topics Concern  . Not on file   Social History Narrative   [place] in [place] SO - charles hook   Retired -Acupuncturistconvenince store clerk/cashier   Enjoys travel - live theater/shows    Family History  Problem Relation Age of  Onset  . Heart attack Neg Hx   . Stroke Maternal Grandmother   . Hypertension Mother     Review of Systems:  As stated in the HPI and otherwise negative.   BP 135/78 mmHg  Pulse 77  Ht 5\' 2"  (1.575 m)  Wt 219 lb (99.338 kg)  BMI 40.05 kg/m2  Physical Examination: General: Well developed, well nourished, NAD HEENT: OP clear, mucus membranes moist SKIN: warm, dry. No rashes. Neuro: No focal deficits Musculoskeletal: Muscle strength 5/5 all ext Psychiatric: Mood and affect normal Neck: No JVD, right carotid bruit, faint. No thyromegaly, no lymphadenopathy. Lungs:Clear bilaterally, no wheezes, rhonci, crackles. Prolonged exp phase Cardiovascular: Regular rate and rhythm. No murmurs, gallops or rubs. Abdomen:Soft. Bowel sounds present. Non-tender.  Extremities: No lower extremity edema. Pulses are 2 + in the bilateral DP/PT.  Stress myoview 12/20/13: Stress Procedure: The patient received IV Lexiscan 0.4 mg over 15-seconds. Technetium 8874m Sestamibi injected at 30-seconds. Quantitative spect images were obtained after a 45 minute delay. During the infusion, the patient complained of slight SOB. This resolved in recovery.  Stress ECG: No significant change from baseline ECG  QPS Raw Data Images: Normal; no motion artifact; normal heart/lung ratio. Stress Images: Normal homogeneous uptake in all areas of the myocardium. Rest Images: Normal homogeneous uptake in all areas of the myocardium. Subtraction (SDS): No evidence of ischemia. Transient Ischemic Dilatation (Normal <1.22): 1.08 Lung/Heart Ratio (Normal <0.45): 0.29  Quantitative Gated Spect Images QGS EDV: 58 ml QGS ESV: 10 ml  Impression Exercise Capacity: Lexiscan with no exercise. BP Response: Normal blood pressure response. Clinical Symptoms: No significant symptoms noted. ECG Impression: No significant ST segment change suggestive of ischemia. Comparison with Prior Nuclear Study: No significant  change from previous study Overall Impression: Normal stress nuclear study. LV Ejection Fraction: 82%. LV Wall Motion: NL LV Function; NL Wall Motion  EKG: Sinus, 1st degree AV block. Rate 77 bpm.   Assessment and Plan:   1. CAD: s/p DES RCA in 2010, residual moderate LAD stenosis by cath 2010. No ischemia on stress test January 2015. No changes. Continue ASA, statin, Plavix, beta blocker.   2. HYPERTENSION: BP controlled. No changes today.  Continue current meds.   3. HYPERLIPIDEMIA: Continue statin. Followed in primary care.   4. Carotid artery disease: Carotid artery dopplers 2014 with mild bilateral disease. We have discussed and will repeat in 2016.   5. Tobacco abuse: Smoking cessation  recommended. She is trying to stop. I have spent 5 minutes discussing the benefits of cessation.

## 2014-11-11 NOTE — Patient Instructions (Signed)
Your physician wants you to follow-up in:  12 months.  You will receive a reminder letter in the mail two months in advance. If you don't receive a letter, please call our office to schedule the follow-up appointment.   

## 2014-12-13 DIAGNOSIS — I10 Essential (primary) hypertension: Secondary | ICD-10-CM | POA: Diagnosis not present

## 2014-12-13 DIAGNOSIS — J449 Chronic obstructive pulmonary disease, unspecified: Secondary | ICD-10-CM | POA: Diagnosis not present

## 2014-12-13 DIAGNOSIS — E782 Mixed hyperlipidemia: Secondary | ICD-10-CM | POA: Diagnosis not present

## 2014-12-13 DIAGNOSIS — Z6841 Body Mass Index (BMI) 40.0 and over, adult: Secondary | ICD-10-CM | POA: Diagnosis not present

## 2014-12-13 DIAGNOSIS — I252 Old myocardial infarction: Secondary | ICD-10-CM | POA: Diagnosis not present

## 2014-12-13 DIAGNOSIS — F17210 Nicotine dependence, cigarettes, uncomplicated: Secondary | ICD-10-CM | POA: Diagnosis not present

## 2014-12-13 DIAGNOSIS — R7301 Impaired fasting glucose: Secondary | ICD-10-CM | POA: Diagnosis not present

## 2014-12-13 DIAGNOSIS — M8580 Other specified disorders of bone density and structure, unspecified site: Secondary | ICD-10-CM | POA: Diagnosis not present

## 2014-12-27 DIAGNOSIS — M9903 Segmental and somatic dysfunction of lumbar region: Secondary | ICD-10-CM | POA: Diagnosis not present

## 2014-12-27 DIAGNOSIS — M5136 Other intervertebral disc degeneration, lumbar region: Secondary | ICD-10-CM | POA: Diagnosis not present

## 2014-12-27 DIAGNOSIS — M5032 Other cervical disc degeneration, mid-cervical region: Secondary | ICD-10-CM | POA: Diagnosis not present

## 2014-12-27 DIAGNOSIS — M9901 Segmental and somatic dysfunction of cervical region: Secondary | ICD-10-CM | POA: Diagnosis not present

## 2014-12-29 DIAGNOSIS — M5136 Other intervertebral disc degeneration, lumbar region: Secondary | ICD-10-CM | POA: Diagnosis not present

## 2014-12-29 DIAGNOSIS — M5032 Other cervical disc degeneration, mid-cervical region: Secondary | ICD-10-CM | POA: Diagnosis not present

## 2014-12-29 DIAGNOSIS — M9903 Segmental and somatic dysfunction of lumbar region: Secondary | ICD-10-CM | POA: Diagnosis not present

## 2014-12-29 DIAGNOSIS — M9901 Segmental and somatic dysfunction of cervical region: Secondary | ICD-10-CM | POA: Diagnosis not present

## 2015-02-09 DIAGNOSIS — H402290 Chronic angle-closure glaucoma, unspecified eye, stage unspecified: Secondary | ICD-10-CM | POA: Diagnosis not present

## 2015-02-09 DIAGNOSIS — H33051 Total retinal detachment, right eye: Secondary | ICD-10-CM | POA: Diagnosis not present

## 2015-02-09 DIAGNOSIS — H2513 Age-related nuclear cataract, bilateral: Secondary | ICD-10-CM | POA: Diagnosis not present

## 2015-02-09 DIAGNOSIS — H40033 Anatomical narrow angle, bilateral: Secondary | ICD-10-CM | POA: Diagnosis not present

## 2015-02-09 DIAGNOSIS — H04123 Dry eye syndrome of bilateral lacrimal glands: Secondary | ICD-10-CM | POA: Diagnosis not present

## 2015-03-02 DIAGNOSIS — M9903 Segmental and somatic dysfunction of lumbar region: Secondary | ICD-10-CM | POA: Diagnosis not present

## 2015-03-02 DIAGNOSIS — M9901 Segmental and somatic dysfunction of cervical region: Secondary | ICD-10-CM | POA: Diagnosis not present

## 2015-03-02 DIAGNOSIS — M5032 Other cervical disc degeneration, mid-cervical region: Secondary | ICD-10-CM | POA: Diagnosis not present

## 2015-03-02 DIAGNOSIS — M5136 Other intervertebral disc degeneration, lumbar region: Secondary | ICD-10-CM | POA: Diagnosis not present

## 2015-03-07 DIAGNOSIS — M9901 Segmental and somatic dysfunction of cervical region: Secondary | ICD-10-CM | POA: Diagnosis not present

## 2015-03-07 DIAGNOSIS — M5032 Other cervical disc degeneration, mid-cervical region: Secondary | ICD-10-CM | POA: Diagnosis not present

## 2015-03-07 DIAGNOSIS — M9903 Segmental and somatic dysfunction of lumbar region: Secondary | ICD-10-CM | POA: Diagnosis not present

## 2015-03-07 DIAGNOSIS — M5136 Other intervertebral disc degeneration, lumbar region: Secondary | ICD-10-CM | POA: Diagnosis not present

## 2015-03-09 DIAGNOSIS — M5032 Other cervical disc degeneration, mid-cervical region: Secondary | ICD-10-CM | POA: Diagnosis not present

## 2015-03-09 DIAGNOSIS — M9903 Segmental and somatic dysfunction of lumbar region: Secondary | ICD-10-CM | POA: Diagnosis not present

## 2015-03-09 DIAGNOSIS — M9901 Segmental and somatic dysfunction of cervical region: Secondary | ICD-10-CM | POA: Diagnosis not present

## 2015-03-09 DIAGNOSIS — M5136 Other intervertebral disc degeneration, lumbar region: Secondary | ICD-10-CM | POA: Diagnosis not present

## 2015-03-13 ENCOUNTER — Other Ambulatory Visit: Payer: Self-pay | Admitting: Internal Medicine

## 2015-03-14 DIAGNOSIS — M9903 Segmental and somatic dysfunction of lumbar region: Secondary | ICD-10-CM | POA: Diagnosis not present

## 2015-03-14 DIAGNOSIS — M5136 Other intervertebral disc degeneration, lumbar region: Secondary | ICD-10-CM | POA: Diagnosis not present

## 2015-03-14 DIAGNOSIS — M9901 Segmental and somatic dysfunction of cervical region: Secondary | ICD-10-CM | POA: Diagnosis not present

## 2015-03-14 DIAGNOSIS — M5032 Other cervical disc degeneration, mid-cervical region: Secondary | ICD-10-CM | POA: Diagnosis not present

## 2015-03-15 DIAGNOSIS — M9901 Segmental and somatic dysfunction of cervical region: Secondary | ICD-10-CM | POA: Diagnosis not present

## 2015-03-15 DIAGNOSIS — M5032 Other cervical disc degeneration, mid-cervical region: Secondary | ICD-10-CM | POA: Diagnosis not present

## 2015-03-15 DIAGNOSIS — M5136 Other intervertebral disc degeneration, lumbar region: Secondary | ICD-10-CM | POA: Diagnosis not present

## 2015-03-15 DIAGNOSIS — M9903 Segmental and somatic dysfunction of lumbar region: Secondary | ICD-10-CM | POA: Diagnosis not present

## 2015-03-16 DIAGNOSIS — M9901 Segmental and somatic dysfunction of cervical region: Secondary | ICD-10-CM | POA: Diagnosis not present

## 2015-03-16 DIAGNOSIS — M5136 Other intervertebral disc degeneration, lumbar region: Secondary | ICD-10-CM | POA: Diagnosis not present

## 2015-03-16 DIAGNOSIS — M5032 Other cervical disc degeneration, mid-cervical region: Secondary | ICD-10-CM | POA: Diagnosis not present

## 2015-03-16 DIAGNOSIS — M9903 Segmental and somatic dysfunction of lumbar region: Secondary | ICD-10-CM | POA: Diagnosis not present

## 2015-03-20 DIAGNOSIS — M9901 Segmental and somatic dysfunction of cervical region: Secondary | ICD-10-CM | POA: Diagnosis not present

## 2015-03-20 DIAGNOSIS — M5136 Other intervertebral disc degeneration, lumbar region: Secondary | ICD-10-CM | POA: Diagnosis not present

## 2015-03-20 DIAGNOSIS — M9903 Segmental and somatic dysfunction of lumbar region: Secondary | ICD-10-CM | POA: Diagnosis not present

## 2015-03-20 DIAGNOSIS — M5032 Other cervical disc degeneration, mid-cervical region: Secondary | ICD-10-CM | POA: Diagnosis not present

## 2015-04-17 DIAGNOSIS — M5032 Other cervical disc degeneration, mid-cervical region: Secondary | ICD-10-CM | POA: Diagnosis not present

## 2015-04-17 DIAGNOSIS — M5136 Other intervertebral disc degeneration, lumbar region: Secondary | ICD-10-CM | POA: Diagnosis not present

## 2015-04-17 DIAGNOSIS — M9901 Segmental and somatic dysfunction of cervical region: Secondary | ICD-10-CM | POA: Diagnosis not present

## 2015-04-17 DIAGNOSIS — M9903 Segmental and somatic dysfunction of lumbar region: Secondary | ICD-10-CM | POA: Diagnosis not present

## 2015-04-18 DIAGNOSIS — R7301 Impaired fasting glucose: Secondary | ICD-10-CM | POA: Diagnosis not present

## 2015-04-18 DIAGNOSIS — R609 Edema, unspecified: Secondary | ICD-10-CM | POA: Diagnosis not present

## 2015-04-18 DIAGNOSIS — J449 Chronic obstructive pulmonary disease, unspecified: Secondary | ICD-10-CM | POA: Diagnosis not present

## 2015-04-18 DIAGNOSIS — R400 Somnolence: Secondary | ICD-10-CM | POA: Diagnosis not present

## 2015-04-18 DIAGNOSIS — I10 Essential (primary) hypertension: Secondary | ICD-10-CM | POA: Diagnosis not present

## 2015-04-18 DIAGNOSIS — R21 Rash and other nonspecific skin eruption: Secondary | ICD-10-CM | POA: Diagnosis not present

## 2015-05-09 DIAGNOSIS — M9901 Segmental and somatic dysfunction of cervical region: Secondary | ICD-10-CM | POA: Diagnosis not present

## 2015-05-09 DIAGNOSIS — M5136 Other intervertebral disc degeneration, lumbar region: Secondary | ICD-10-CM | POA: Diagnosis not present

## 2015-05-09 DIAGNOSIS — M9903 Segmental and somatic dysfunction of lumbar region: Secondary | ICD-10-CM | POA: Diagnosis not present

## 2015-05-09 DIAGNOSIS — M5032 Other cervical disc degeneration, mid-cervical region: Secondary | ICD-10-CM | POA: Diagnosis not present

## 2015-05-11 DIAGNOSIS — M5032 Other cervical disc degeneration, mid-cervical region: Secondary | ICD-10-CM | POA: Diagnosis not present

## 2015-05-11 DIAGNOSIS — M5136 Other intervertebral disc degeneration, lumbar region: Secondary | ICD-10-CM | POA: Diagnosis not present

## 2015-05-11 DIAGNOSIS — M9903 Segmental and somatic dysfunction of lumbar region: Secondary | ICD-10-CM | POA: Diagnosis not present

## 2015-05-11 DIAGNOSIS — M9901 Segmental and somatic dysfunction of cervical region: Secondary | ICD-10-CM | POA: Diagnosis not present

## 2015-05-12 DIAGNOSIS — M5136 Other intervertebral disc degeneration, lumbar region: Secondary | ICD-10-CM | POA: Diagnosis not present

## 2015-05-12 DIAGNOSIS — M9903 Segmental and somatic dysfunction of lumbar region: Secondary | ICD-10-CM | POA: Diagnosis not present

## 2015-05-12 DIAGNOSIS — M5032 Other cervical disc degeneration, mid-cervical region: Secondary | ICD-10-CM | POA: Diagnosis not present

## 2015-05-12 DIAGNOSIS — M9901 Segmental and somatic dysfunction of cervical region: Secondary | ICD-10-CM | POA: Diagnosis not present

## 2015-05-15 DIAGNOSIS — R400 Somnolence: Secondary | ICD-10-CM | POA: Diagnosis not present

## 2015-05-15 DIAGNOSIS — Z1389 Encounter for screening for other disorder: Secondary | ICD-10-CM | POA: Diagnosis not present

## 2015-05-15 DIAGNOSIS — R202 Paresthesia of skin: Secondary | ICD-10-CM | POA: Diagnosis not present

## 2015-05-15 DIAGNOSIS — R609 Edema, unspecified: Secondary | ICD-10-CM | POA: Diagnosis not present

## 2015-05-16 DIAGNOSIS — M9903 Segmental and somatic dysfunction of lumbar region: Secondary | ICD-10-CM | POA: Diagnosis not present

## 2015-05-16 DIAGNOSIS — M5136 Other intervertebral disc degeneration, lumbar region: Secondary | ICD-10-CM | POA: Diagnosis not present

## 2015-05-16 DIAGNOSIS — M9901 Segmental and somatic dysfunction of cervical region: Secondary | ICD-10-CM | POA: Diagnosis not present

## 2015-05-16 DIAGNOSIS — M5032 Other cervical disc degeneration, mid-cervical region: Secondary | ICD-10-CM | POA: Diagnosis not present

## 2015-05-17 DIAGNOSIS — M5136 Other intervertebral disc degeneration, lumbar region: Secondary | ICD-10-CM | POA: Diagnosis not present

## 2015-05-17 DIAGNOSIS — M5032 Other cervical disc degeneration, mid-cervical region: Secondary | ICD-10-CM | POA: Diagnosis not present

## 2015-05-17 DIAGNOSIS — M9901 Segmental and somatic dysfunction of cervical region: Secondary | ICD-10-CM | POA: Diagnosis not present

## 2015-05-17 DIAGNOSIS — M9903 Segmental and somatic dysfunction of lumbar region: Secondary | ICD-10-CM | POA: Diagnosis not present

## 2015-05-18 DIAGNOSIS — M5032 Other cervical disc degeneration, mid-cervical region: Secondary | ICD-10-CM | POA: Diagnosis not present

## 2015-05-18 DIAGNOSIS — M5136 Other intervertebral disc degeneration, lumbar region: Secondary | ICD-10-CM | POA: Diagnosis not present

## 2015-05-18 DIAGNOSIS — M9901 Segmental and somatic dysfunction of cervical region: Secondary | ICD-10-CM | POA: Diagnosis not present

## 2015-05-18 DIAGNOSIS — M9903 Segmental and somatic dysfunction of lumbar region: Secondary | ICD-10-CM | POA: Diagnosis not present

## 2015-05-19 DIAGNOSIS — M9901 Segmental and somatic dysfunction of cervical region: Secondary | ICD-10-CM | POA: Diagnosis not present

## 2015-05-19 DIAGNOSIS — M9903 Segmental and somatic dysfunction of lumbar region: Secondary | ICD-10-CM | POA: Diagnosis not present

## 2015-05-19 DIAGNOSIS — M5032 Other cervical disc degeneration, mid-cervical region: Secondary | ICD-10-CM | POA: Diagnosis not present

## 2015-05-19 DIAGNOSIS — M5136 Other intervertebral disc degeneration, lumbar region: Secondary | ICD-10-CM | POA: Diagnosis not present

## 2015-05-23 DIAGNOSIS — M9903 Segmental and somatic dysfunction of lumbar region: Secondary | ICD-10-CM | POA: Diagnosis not present

## 2015-05-23 DIAGNOSIS — M5136 Other intervertebral disc degeneration, lumbar region: Secondary | ICD-10-CM | POA: Diagnosis not present

## 2015-05-23 DIAGNOSIS — M9901 Segmental and somatic dysfunction of cervical region: Secondary | ICD-10-CM | POA: Diagnosis not present

## 2015-05-23 DIAGNOSIS — M5032 Other cervical disc degeneration, mid-cervical region: Secondary | ICD-10-CM | POA: Diagnosis not present

## 2015-05-25 DIAGNOSIS — M5032 Other cervical disc degeneration, mid-cervical region: Secondary | ICD-10-CM | POA: Diagnosis not present

## 2015-05-25 DIAGNOSIS — L304 Erythema intertrigo: Secondary | ICD-10-CM | POA: Diagnosis not present

## 2015-05-25 DIAGNOSIS — M9901 Segmental and somatic dysfunction of cervical region: Secondary | ICD-10-CM | POA: Diagnosis not present

## 2015-05-25 DIAGNOSIS — M9903 Segmental and somatic dysfunction of lumbar region: Secondary | ICD-10-CM | POA: Diagnosis not present

## 2015-05-25 DIAGNOSIS — M5136 Other intervertebral disc degeneration, lumbar region: Secondary | ICD-10-CM | POA: Diagnosis not present

## 2015-05-25 DIAGNOSIS — I872 Venous insufficiency (chronic) (peripheral): Secondary | ICD-10-CM | POA: Diagnosis not present

## 2015-05-31 DIAGNOSIS — M9901 Segmental and somatic dysfunction of cervical region: Secondary | ICD-10-CM | POA: Diagnosis not present

## 2015-05-31 DIAGNOSIS — M5136 Other intervertebral disc degeneration, lumbar region: Secondary | ICD-10-CM | POA: Diagnosis not present

## 2015-05-31 DIAGNOSIS — M9903 Segmental and somatic dysfunction of lumbar region: Secondary | ICD-10-CM | POA: Diagnosis not present

## 2015-05-31 DIAGNOSIS — M5032 Other cervical disc degeneration, mid-cervical region: Secondary | ICD-10-CM | POA: Diagnosis not present

## 2015-06-01 DIAGNOSIS — M5136 Other intervertebral disc degeneration, lumbar region: Secondary | ICD-10-CM | POA: Diagnosis not present

## 2015-06-01 DIAGNOSIS — M9903 Segmental and somatic dysfunction of lumbar region: Secondary | ICD-10-CM | POA: Diagnosis not present

## 2015-06-01 DIAGNOSIS — M9901 Segmental and somatic dysfunction of cervical region: Secondary | ICD-10-CM | POA: Diagnosis not present

## 2015-06-01 DIAGNOSIS — M5032 Other cervical disc degeneration, mid-cervical region: Secondary | ICD-10-CM | POA: Diagnosis not present

## 2015-06-07 DIAGNOSIS — M9901 Segmental and somatic dysfunction of cervical region: Secondary | ICD-10-CM | POA: Diagnosis not present

## 2015-06-07 DIAGNOSIS — M5136 Other intervertebral disc degeneration, lumbar region: Secondary | ICD-10-CM | POA: Diagnosis not present

## 2015-06-07 DIAGNOSIS — M5032 Other cervical disc degeneration, mid-cervical region: Secondary | ICD-10-CM | POA: Diagnosis not present

## 2015-06-07 DIAGNOSIS — M9903 Segmental and somatic dysfunction of lumbar region: Secondary | ICD-10-CM | POA: Diagnosis not present

## 2015-06-08 DIAGNOSIS — M5032 Other cervical disc degeneration, mid-cervical region: Secondary | ICD-10-CM | POA: Diagnosis not present

## 2015-06-08 DIAGNOSIS — M9903 Segmental and somatic dysfunction of lumbar region: Secondary | ICD-10-CM | POA: Diagnosis not present

## 2015-06-08 DIAGNOSIS — M5136 Other intervertebral disc degeneration, lumbar region: Secondary | ICD-10-CM | POA: Diagnosis not present

## 2015-06-08 DIAGNOSIS — M9901 Segmental and somatic dysfunction of cervical region: Secondary | ICD-10-CM | POA: Diagnosis not present

## 2015-06-15 DIAGNOSIS — M9901 Segmental and somatic dysfunction of cervical region: Secondary | ICD-10-CM | POA: Diagnosis not present

## 2015-06-15 DIAGNOSIS — M5032 Other cervical disc degeneration, mid-cervical region: Secondary | ICD-10-CM | POA: Diagnosis not present

## 2015-06-15 DIAGNOSIS — M5136 Other intervertebral disc degeneration, lumbar region: Secondary | ICD-10-CM | POA: Diagnosis not present

## 2015-06-15 DIAGNOSIS — M9903 Segmental and somatic dysfunction of lumbar region: Secondary | ICD-10-CM | POA: Diagnosis not present

## 2015-07-05 DIAGNOSIS — Z5181 Encounter for therapeutic drug level monitoring: Secondary | ICD-10-CM | POA: Diagnosis not present

## 2015-07-05 DIAGNOSIS — J449 Chronic obstructive pulmonary disease, unspecified: Secondary | ICD-10-CM | POA: Diagnosis not present

## 2015-07-05 DIAGNOSIS — R609 Edema, unspecified: Secondary | ICD-10-CM | POA: Diagnosis not present

## 2015-07-05 DIAGNOSIS — M79605 Pain in left leg: Secondary | ICD-10-CM | POA: Diagnosis not present

## 2015-07-07 DIAGNOSIS — M79605 Pain in left leg: Secondary | ICD-10-CM | POA: Diagnosis not present

## 2015-07-07 DIAGNOSIS — R600 Localized edema: Secondary | ICD-10-CM | POA: Diagnosis not present

## 2015-07-07 DIAGNOSIS — M79604 Pain in right leg: Secondary | ICD-10-CM | POA: Diagnosis not present

## 2015-07-11 ENCOUNTER — Other Ambulatory Visit: Payer: Self-pay | Admitting: Internal Medicine

## 2015-07-12 DIAGNOSIS — M5032 Other cervical disc degeneration, mid-cervical region: Secondary | ICD-10-CM | POA: Diagnosis not present

## 2015-07-12 DIAGNOSIS — M9903 Segmental and somatic dysfunction of lumbar region: Secondary | ICD-10-CM | POA: Diagnosis not present

## 2015-07-12 DIAGNOSIS — M5136 Other intervertebral disc degeneration, lumbar region: Secondary | ICD-10-CM | POA: Diagnosis not present

## 2015-07-12 DIAGNOSIS — M9901 Segmental and somatic dysfunction of cervical region: Secondary | ICD-10-CM | POA: Diagnosis not present

## 2015-07-19 DIAGNOSIS — M9901 Segmental and somatic dysfunction of cervical region: Secondary | ICD-10-CM | POA: Diagnosis not present

## 2015-07-19 DIAGNOSIS — M9903 Segmental and somatic dysfunction of lumbar region: Secondary | ICD-10-CM | POA: Diagnosis not present

## 2015-07-19 DIAGNOSIS — M5136 Other intervertebral disc degeneration, lumbar region: Secondary | ICD-10-CM | POA: Diagnosis not present

## 2015-07-19 DIAGNOSIS — M5032 Other cervical disc degeneration, mid-cervical region: Secondary | ICD-10-CM | POA: Diagnosis not present

## 2015-08-01 ENCOUNTER — Other Ambulatory Visit: Payer: Self-pay | Admitting: Internal Medicine

## 2015-08-01 DIAGNOSIS — M79605 Pain in left leg: Secondary | ICD-10-CM

## 2015-08-02 ENCOUNTER — Ambulatory Visit
Admission: RE | Admit: 2015-08-02 | Discharge: 2015-08-02 | Disposition: A | Discharge status: Home / Self Care | Payer: Medicare Other | Source: Ambulatory Visit | Attending: Internal Medicine | Admitting: Internal Medicine

## 2015-08-02 DIAGNOSIS — M79605 Pain in left leg: Secondary | ICD-10-CM

## 2015-08-11 ENCOUNTER — Ambulatory Visit
Admission: RE | Admit: 2015-08-11 | Discharge: 2015-08-11 | Disposition: A | Discharge status: Home / Self Care | Payer: Medicare Other | Source: Ambulatory Visit | Attending: Internal Medicine | Admitting: Internal Medicine

## 2015-08-11 DIAGNOSIS — M4806 Spinal stenosis, lumbar region: Secondary | ICD-10-CM | POA: Diagnosis not present

## 2015-08-11 DIAGNOSIS — M47816 Spondylosis without myelopathy or radiculopathy, lumbar region: Secondary | ICD-10-CM | POA: Diagnosis not present

## 2015-08-16 DIAGNOSIS — H402212 Chronic angle-closure glaucoma, right eye, moderate stage: Secondary | ICD-10-CM | POA: Diagnosis not present

## 2015-08-16 DIAGNOSIS — H04123 Dry eye syndrome of bilateral lacrimal glands: Secondary | ICD-10-CM | POA: Diagnosis not present

## 2015-08-16 DIAGNOSIS — H33051 Total retinal detachment, right eye: Secondary | ICD-10-CM | POA: Diagnosis not present

## 2015-08-16 DIAGNOSIS — H2513 Age-related nuclear cataract, bilateral: Secondary | ICD-10-CM | POA: Diagnosis not present

## 2015-08-16 DIAGNOSIS — H40033 Anatomical narrow angle, bilateral: Secondary | ICD-10-CM | POA: Diagnosis not present

## 2015-08-22 DIAGNOSIS — Z23 Encounter for immunization: Secondary | ICD-10-CM | POA: Diagnosis not present

## 2015-08-22 DIAGNOSIS — M5136 Other intervertebral disc degeneration, lumbar region: Secondary | ICD-10-CM | POA: Diagnosis not present

## 2015-08-22 DIAGNOSIS — M4806 Spinal stenosis, lumbar region: Secondary | ICD-10-CM | POA: Diagnosis not present

## 2015-08-22 DIAGNOSIS — J449 Chronic obstructive pulmonary disease, unspecified: Secondary | ICD-10-CM | POA: Diagnosis not present

## 2015-08-22 DIAGNOSIS — Z5181 Encounter for therapeutic drug level monitoring: Secondary | ICD-10-CM | POA: Diagnosis not present

## 2015-08-22 DIAGNOSIS — I10 Essential (primary) hypertension: Secondary | ICD-10-CM | POA: Diagnosis not present

## 2015-08-22 DIAGNOSIS — R609 Edema, unspecified: Secondary | ICD-10-CM | POA: Diagnosis not present

## 2015-11-20 ENCOUNTER — Encounter: Payer: Self-pay | Admitting: *Deleted

## 2015-11-23 ENCOUNTER — Encounter: Payer: Self-pay | Admitting: Cardiovascular Disease

## 2015-11-23 ENCOUNTER — Ambulatory Visit (INDEPENDENT_AMBULATORY_CARE_PROVIDER_SITE_OTHER): Payer: Medicare Other | Admitting: Cardiovascular Disease

## 2015-11-23 VITALS — BP 130/60 | HR 80 | Ht 62.0 in | Wt 217.6 lb

## 2015-11-23 DIAGNOSIS — I10 Essential (primary) hypertension: Secondary | ICD-10-CM

## 2015-11-23 DIAGNOSIS — I739 Peripheral vascular disease, unspecified: Secondary | ICD-10-CM

## 2015-11-23 DIAGNOSIS — I779 Disorder of arteries and arterioles, unspecified: Secondary | ICD-10-CM | POA: Diagnosis not present

## 2015-11-23 DIAGNOSIS — I2510 Atherosclerotic heart disease of native coronary artery without angina pectoris: Secondary | ICD-10-CM

## 2015-11-23 DIAGNOSIS — E785 Hyperlipidemia, unspecified: Secondary | ICD-10-CM | POA: Diagnosis not present

## 2015-11-23 DIAGNOSIS — Z720 Tobacco use: Secondary | ICD-10-CM | POA: Diagnosis not present

## 2015-11-23 NOTE — Progress Notes (Signed)
.cdm Chief Complaint  Patient presents with  . Follow-up    pt c/o swelling in legs  . Coronary Artery Disease  . Hypertension    History of Present Illness: 81 yo WF with history of CAD, HTN, HLD, COPD and DM here today cardiac follow up. She has been followed in the past by Dr. Juanda Chance. In July of 2010 she had an inferior MI treated with a DES to the right coronary artery. She had residual 70% LAD stenosis and an ejection fraction of 60%. Stress myoview negative for ischemia April 2011.  She also has chronic shortness of breath and moderate chronic obstructive pulmonary disease by pulmonary function testing. She has continued to smoke despite advice against this.  Stress myoview 12/20/13 with no ischemia, normal LV function.   She has been feeling well. No chest pain. Breathing has been at baseline. No dizziness, near syncope or syncope. She has been widowed since 1993. She continues to smoke 1/2 ppd. Some LE edema but this is being managed in primary care.   Primary Care Physician: Kirby Funk  Last Lipid Profile: Followed in primary care   Past Medical History  Diagnosis Date  . TOBACCO ABUSE   . CAD (coronary artery disease) 06/2009    DEs RCA with 70% LAD and EF 60%  . ANEMIA-NOS   . GLAUCOMA   . HYPERLIPIDEMIA   . HYPERTENSION, BENIGN   . COPD     mild obst on PFTs 03/2010  . Diabetes mellitus 06/2010 dx    Mild, diet controlled  . ACUT DUOD ULCER W/HEMORR&PERF W/O MENTION OBST 10/05/2009    NSAID induced  . MYOCARDIAL INFARCTION 06/2009    des to rca  . ALLERGIC RHINITIS CAUSE UNSPECIFIED     Past Surgical History  Procedure Laterality Date  . Right knee surgery    . Hemorrhoid surgery  1990    Current Outpatient Prescriptions  Medication Sig Dispense Refill  . aspirin 81 MG tablet Take 81 mg by mouth daily.      . clopidogrel (PLAVIX) 75 MG tablet TAKE ONE TABLET EVERY DAY 30 tablet 11  . dorzolamide (TRUSOPT) 2 % ophthalmic solution 1 drop 2 (two) times daily.       . famotidine (PEPCID) 20 MG tablet Take 20 mg by mouth 2 (two) times daily.    . Ferrous Sulfate Dried (FEOSOL) 200 (65 FE) MG TABS Take by mouth daily as needed.      . furosemide (LASIX) 40 MG tablet Take 40 mg by mouth daily.    Marland Kitchen gabapentin (NEURONTIN) 100 MG capsule Take 100 mg by mouth daily.    Marland Kitchen latanoprost (XALATAN) 0.005 % ophthalmic solution Place 1 drop into both eyes at bedtime.      . magnesium hydroxide (MILK OF MAGNESIA) 400 MG/5ML suspension Take by mouth daily.      . metolazone (ZAROXOLYN) 2.5 MG tablet Take 2.5 mg by mouth daily.    . metoprolol succinate (TOPROL-XL) 25 MG 24 hr tablet Take 25 mg by mouth daily.    Marland Kitchen NITROSTAT 0.4 MG SL tablet DISSOLVE ONE TABLET UNDER TONGUE AS NEEDED FOR CHEST PAIN - MAY REPEAT TWICE-IF NO RELIEF GO TO NEAREST HOSPITAL ER 25 tablet 0  . potassium chloride (K-DUR) 10 MEQ tablet Take 10 mEq by mouth daily.    . potassium chloride (K-DUR,KLOR-CON) 10 MEQ tablet Take 10 mEq by mouth daily.    . rosuvastatin (CRESTOR) 20 MG tablet Take 20 mg by mouth daily.    Marland Kitchen  Tiotropium Bromide Monohydrate (SPIRIVA RESPIMAT) 2.5 MCG/ACT AERS Inhale 2 puffs into the lungs daily.    . VENTOLIN HFA 108 (90 BASE) MCG/ACT inhaler INHALE ONE PUFF EVERY 6 HOURS AS NEEDED FOR SHORTNESS OF BREATH 18 g 2   No current facility-administered medications for this visit.    Allergies  Allergen Reactions  . Atorvastatin Itching    Itching and myaliga  . Meperidine Hcl Other (See Comments)    Not known  . Pravastatin Rash    rash    Social History   Social History  . Marital Status: Widowed    Spouse Name: N/A  . Number of Children: N/A  . Years of Education: N/A   Occupational History  . Not on file.   Social History Main Topics  . Smoking status: Current Every Day Smoker -- 0.50 packs/day for 60 years  . Smokeless tobacco: Never Used     Comment: She lives in [place] with her significant other (Charles Hook)0  . Alcohol Use: Yes     Comment:  Occassional  . Drug Use: No  . Sexual Activity: Not on file   Other Topics Concern  . Not on file   Social History Narrative   [place] SO - charles hook   Retired -Acupuncturistconvenince store clerk/cashier   Enjoys travel - live theater/shows    Family History  Problem Relation Age of Onset  . Heart attack Neg Hx   . Stroke Maternal Grandmother   . Hypertension Mother   . Stomach cancer Father   . Ulcers Mother     Review of Systems:  As stated in the HPI and otherwise negative.   BP 130/60 mmHg  Pulse 80  Ht 5\' 2"  (1.575 m)  Wt 217 lb 9.6 oz (98.703 kg)  BMI 39.79 kg/m2  SpO2 95%  Physical Examination: General: Well developed, well nourished, NAD HEENT: OP clear, mucus membranes moist SKIN: warm, dry. No rashes. Neuro: No focal deficits Musculoskeletal: Muscle strength 5/5 all ext Psychiatric: Mood and affect normal Neck: No JVD, right carotid bruit, faint. No thyromegaly, no lymphadenopathy. Lungs:Clear bilaterally, no wheezes, rhonci, crackles. Prolonged exp phase Cardiovascular: Regular rate and rhythm. No murmurs, gallops or rubs. Abdomen:Soft. Bowel sounds present. Non-tender.  Extremities: No lower extremity edema. Pulses are 2 + in the bilateral DP/PT.  Stress myoview 12/20/13: Stress Procedure: The patient received IV Lexiscan 0.4 mg over 15-seconds. Technetium 8186m Sestamibi injected at 30-seconds. Quantitative spect images were obtained after a 45 minute delay. During the infusion, the patient complained of slight SOB. This resolved in recovery.  Stress ECG: No significant change from baseline ECG  QPS Raw Data Images: Normal; no motion artifact; normal heart/lung ratio. Stress Images: Normal homogeneous uptake in all areas of the myocardium. Rest Images: Normal homogeneous uptake in all areas of the myocardium. Subtraction (SDS): No evidence of ischemia. Transient Ischemic Dilatation (Normal <1.22): 1.08 Lung/Heart Ratio (Normal <0.45):  0.29  Quantitative Gated Spect Images QGS EDV: 58 ml QGS ESV: 10 ml  Impression Exercise Capacity: Lexiscan with no exercise. BP Response: Normal blood pressure response. Clinical Symptoms: No significant symptoms noted. ECG Impression: No significant ST segment change suggestive of ischemia. Comparison with Prior Nuclear Study: No significant change from previous study Overall Impression: Normal stress nuclear study. LV Ejection Fraction: 82%. LV Wall Motion: NL LV Function; NL Wall Motion  EKG:  EKG is ordered today. The ekg ordered today demonstrates Sinus rate 80 bpm. 1st degree AV block.  Recent Labs: No results found for requested labs within last 365 days.    Wt Readings from Last 3 Encounters:  11/23/15 217 lb 9.6 oz (98.703 kg)  11/11/14 219 lb (99.338 kg)  01/20/14 212 lb 6.4 oz (96.344 kg)     Other studies Reviewed: Additional studies/ records that were reviewed today include: . Review of the above records demonstrates:    Assessment and Plan:   1. CAD: s/p DES RCA in 2010, residual moderate LAD stenosis by cath 2010. No ischemia on stress test January 2015. No changes. Continue ASA, statin, Plavix, beta blocker.   2. HYPERTENSION: BP controlled. No changes today.  Continue current meds.   3. HYPERLIPIDEMIA: Continue statin. Followed in primary care.   4. Carotid artery disease: Carotid artery dopplers 2015 with mild bilateral disease.   5. Tobacco abuse: Smoking cessation recommended. She does not wish to stop.    Current medicines are reviewed at length with the patient today.  The patient does not have concerns regarding medicines.  The following changes have been made:  no change  Labs/ tests ordered today include:   Orders Placed This Encounter  Procedures  . EKG 12-Lead     Disposition:   FU with me in 12  months   Signed, Verne Carrow, MD 11/23/2015 5:05 PM    [facility] [address] [place],  [place], [place]  [address] Phone: [phone]; Fax: [phone]

## 2015-11-23 NOTE — Patient Instructions (Signed)
Medication Instructions:  The current medical regimen is effective;  continue present plan and medications.  Follow-Up: Follow up in 1 year with Dr. McAlhany.  You will receive a letter in the mail 2 months before you are due.  Please call us when you receive this letter to schedule your follow up appointment.  If you need a refill on your cardiac medications before your next appointment, please call your pharmacy.  Thank you for choosing [facility]!!      

## 2016-01-02 DIAGNOSIS — K921 Melena: Secondary | ICD-10-CM | POA: Diagnosis not present

## 2016-01-02 DIAGNOSIS — J441 Chronic obstructive pulmonary disease with (acute) exacerbation: Secondary | ICD-10-CM | POA: Diagnosis not present

## 2016-01-02 DIAGNOSIS — R5381 Other malaise: Secondary | ICD-10-CM | POA: Diagnosis not present

## 2016-01-10 DIAGNOSIS — K921 Melena: Secondary | ICD-10-CM | POA: Diagnosis not present

## 2016-01-10 DIAGNOSIS — R1013 Epigastric pain: Secondary | ICD-10-CM | POA: Diagnosis not present

## 2016-01-10 DIAGNOSIS — J449 Chronic obstructive pulmonary disease, unspecified: Secondary | ICD-10-CM | POA: Diagnosis not present

## 2016-01-11 DIAGNOSIS — J441 Chronic obstructive pulmonary disease with (acute) exacerbation: Secondary | ICD-10-CM | POA: Diagnosis not present

## 2016-01-11 DIAGNOSIS — K921 Melena: Secondary | ICD-10-CM | POA: Diagnosis not present

## 2016-01-11 DIAGNOSIS — R5381 Other malaise: Secondary | ICD-10-CM | POA: Diagnosis not present

## 2016-01-15 DIAGNOSIS — J449 Chronic obstructive pulmonary disease, unspecified: Secondary | ICD-10-CM | POA: Diagnosis not present

## 2016-01-15 DIAGNOSIS — Z5181 Encounter for therapeutic drug level monitoring: Secondary | ICD-10-CM | POA: Diagnosis not present

## 2016-01-15 DIAGNOSIS — R7301 Impaired fasting glucose: Secondary | ICD-10-CM | POA: Diagnosis not present

## 2016-01-15 DIAGNOSIS — K921 Melena: Secondary | ICD-10-CM | POA: Diagnosis not present

## 2016-01-15 DIAGNOSIS — I10 Essential (primary) hypertension: Secondary | ICD-10-CM | POA: Diagnosis not present

## 2016-01-22 DIAGNOSIS — N179 Acute kidney failure, unspecified: Secondary | ICD-10-CM | POA: Diagnosis not present

## 2016-02-05 DIAGNOSIS — I2510 Atherosclerotic heart disease of native coronary artery without angina pectoris: Secondary | ICD-10-CM | POA: Diagnosis not present

## 2016-02-05 DIAGNOSIS — Z6838 Body mass index (BMI) 38.0-38.9, adult: Secondary | ICD-10-CM | POA: Diagnosis not present

## 2016-02-05 DIAGNOSIS — K921 Melena: Secondary | ICD-10-CM | POA: Diagnosis not present

## 2016-02-05 DIAGNOSIS — J449 Chronic obstructive pulmonary disease, unspecified: Secondary | ICD-10-CM | POA: Diagnosis not present

## 2016-02-06 DIAGNOSIS — K921 Melena: Secondary | ICD-10-CM | POA: Diagnosis not present

## 2016-02-21 DIAGNOSIS — H33051 Total retinal detachment, right eye: Secondary | ICD-10-CM | POA: Diagnosis not present

## 2016-02-21 DIAGNOSIS — H40033 Anatomical narrow angle, bilateral: Secondary | ICD-10-CM | POA: Diagnosis not present

## 2016-02-21 DIAGNOSIS — H402221 Chronic angle-closure glaucoma, left eye, mild stage: Secondary | ICD-10-CM | POA: Diagnosis not present

## 2016-02-21 DIAGNOSIS — H2513 Age-related nuclear cataract, bilateral: Secondary | ICD-10-CM | POA: Diagnosis not present

## 2016-09-12 DIAGNOSIS — F17210 Nicotine dependence, cigarettes, uncomplicated: Secondary | ICD-10-CM | POA: Diagnosis not present

## 2016-09-12 DIAGNOSIS — J449 Chronic obstructive pulmonary disease, unspecified: Secondary | ICD-10-CM | POA: Diagnosis not present

## 2016-09-12 DIAGNOSIS — R7301 Impaired fasting glucose: Secondary | ICD-10-CM | POA: Diagnosis not present

## 2016-09-12 DIAGNOSIS — Z0000 Encounter for general adult medical examination without abnormal findings: Secondary | ICD-10-CM | POA: Diagnosis not present

## 2016-09-12 DIAGNOSIS — Z23 Encounter for immunization: Secondary | ICD-10-CM | POA: Diagnosis not present

## 2016-09-12 DIAGNOSIS — Z1389 Encounter for screening for other disorder: Secondary | ICD-10-CM | POA: Diagnosis not present

## 2016-09-12 DIAGNOSIS — Z6839 Body mass index (BMI) 39.0-39.9, adult: Secondary | ICD-10-CM | POA: Diagnosis not present

## 2016-09-12 DIAGNOSIS — E782 Mixed hyperlipidemia: Secondary | ICD-10-CM | POA: Diagnosis not present

## 2016-09-12 DIAGNOSIS — I10 Essential (primary) hypertension: Secondary | ICD-10-CM | POA: Diagnosis not present

## 2016-09-19 DIAGNOSIS — M9901 Segmental and somatic dysfunction of cervical region: Secondary | ICD-10-CM | POA: Diagnosis not present

## 2016-09-19 DIAGNOSIS — M50322 Other cervical disc degeneration at C5-C6 level: Secondary | ICD-10-CM | POA: Diagnosis not present

## 2016-09-25 DIAGNOSIS — H2513 Age-related nuclear cataract, bilateral: Secondary | ICD-10-CM | POA: Diagnosis not present

## 2016-09-25 DIAGNOSIS — H402221 Chronic angle-closure glaucoma, left eye, mild stage: Secondary | ICD-10-CM | POA: Diagnosis not present

## 2016-09-25 DIAGNOSIS — H40033 Anatomical narrow angle, bilateral: Secondary | ICD-10-CM | POA: Diagnosis not present

## 2016-10-15 DIAGNOSIS — J441 Chronic obstructive pulmonary disease with (acute) exacerbation: Secondary | ICD-10-CM | POA: Diagnosis not present

## 2016-10-18 DIAGNOSIS — J449 Chronic obstructive pulmonary disease, unspecified: Secondary | ICD-10-CM | POA: Diagnosis not present

## 2016-11-13 DIAGNOSIS — J449 Chronic obstructive pulmonary disease, unspecified: Secondary | ICD-10-CM | POA: Diagnosis not present

## 2016-11-13 DIAGNOSIS — I10 Essential (primary) hypertension: Secondary | ICD-10-CM | POA: Diagnosis not present

## 2017-01-31 ENCOUNTER — Encounter: Payer: Self-pay | Admitting: Cardiovascular Disease

## 2017-01-31 ENCOUNTER — Ambulatory Visit (INDEPENDENT_AMBULATORY_CARE_PROVIDER_SITE_OTHER): Payer: Medicare Other | Admitting: Cardiovascular Disease

## 2017-01-31 VITALS — BP 144/82 | HR 55 | Ht 62.0 in | Wt 221.8 lb

## 2017-01-31 DIAGNOSIS — E7800 Pure hypercholesterolemia, unspecified: Secondary | ICD-10-CM | POA: Diagnosis not present

## 2017-01-31 DIAGNOSIS — I2510 Atherosclerotic heart disease of native coronary artery without angina pectoris: Secondary | ICD-10-CM | POA: Diagnosis not present

## 2017-01-31 DIAGNOSIS — I739 Peripheral vascular disease, unspecified: Secondary | ICD-10-CM

## 2017-01-31 DIAGNOSIS — I779 Disorder of arteries and arterioles, unspecified: Secondary | ICD-10-CM | POA: Diagnosis not present

## 2017-01-31 DIAGNOSIS — I10 Essential (primary) hypertension: Secondary | ICD-10-CM | POA: Diagnosis not present

## 2017-01-31 DIAGNOSIS — I441 Atrioventricular block, second degree: Secondary | ICD-10-CM | POA: Diagnosis not present

## 2017-01-31 DIAGNOSIS — Z720 Tobacco use: Secondary | ICD-10-CM | POA: Diagnosis not present

## 2017-01-31 NOTE — Patient Instructions (Signed)

## 2017-01-31 NOTE — Progress Notes (Signed)
Chief Complaint  Patient presents with  . Shortness of Breath    History of Present Illness: 83 yo WF with history of CAD, HTN, HLD, COPD and DM here today cardiac follow up. In July of 2010 she had an inferior MI treated with a DES in the right coronary artery. She had residual 70% LAD stenosis and an ejection fraction of 60%. She also has chronic shortness of breath and moderate chronic obstructive pulmonary disease by pulmonary function testing. She has continued to smoke despite advice against this.  Stress myoview 12/20/13 with no ischemia, normal LV function.   She has been feeling well. No chest pain. Breathing has been at baseline. No dizziness, near syncope or syncope. She has been widowed since 1993. She continues to smoke 1/2 ppd.   Primary Care Physician: Lillia Mountain, MD   Past Medical History:  Diagnosis Date  . ACUT DUOD ULCER W/HEMORR&PERF W/O MENTION OBST 10/05/2009   NSAID induced  . ALLERGIC RHINITIS CAUSE UNSPECIFIED   . ANEMIA-NOS   . CAD (coronary artery disease) 06/2009   DEs RCA with 70% LAD and EF 60%  . COPD    mild obst on PFTs 03/2010  . Diabetes mellitus 06/2010 dx   Mild, diet controlled  . GLAUCOMA   . HYPERLIPIDEMIA   . HYPERTENSION, BENIGN   . MYOCARDIAL INFARCTION 06/2009   des to rca  . TOBACCO ABUSE     Past Surgical History:  Procedure Laterality Date  . HEMORRHOID SURGERY  1990  . Right knee surgery      Current Outpatient Prescriptions  Medication Sig Dispense Refill  . aspirin 81 MG tablet Take 81 mg by mouth daily.      . clopidogrel (PLAVIX) 75 MG tablet TAKE ONE TABLET EVERY DAY 30 tablet 11  . dorzolamide (TRUSOPT) 2 % ophthalmic solution 1 drop 2 (two) times daily.      . famotidine (PEPCID) 20 MG tablet Take 20 mg by mouth 2 (two) times daily.    . Ferrous Sulfate Dried (FEOSOL) 200 (65 FE) MG TABS Take by mouth daily as needed.      . furosemide (LASIX) 40 MG tablet Take 40 mg by mouth daily.    Marland Kitchen gabapentin  (NEURONTIN) 100 MG capsule Take 100 mg by mouth daily.    Marland Kitchen latanoprost (XALATAN) 0.005 % ophthalmic solution Place 1 drop into both eyes at bedtime.      . magnesium hydroxide (MILK OF MAGNESIA) 400 MG/5ML suspension Take by mouth daily.      . metolazone (ZAROXOLYN) 2.5 MG tablet Take 2.5 mg by mouth daily.    . metoprolol succinate (TOPROL-XL) 25 MG 24 hr tablet Take 25 mg by mouth daily.    Marland Kitchen NITROSTAT 0.4 MG SL tablet DISSOLVE ONE TABLET UNDER TONGUE AS NEEDED FOR CHEST PAIN - MAY REPEAT TWICE-IF NO RELIEF GO TO NEAREST HOSPITAL ER 25 tablet 0  . pantoprazole (PROTONIX) 40 MG tablet Take 40 mg by mouth 2 (two) times daily.    . potassium chloride (K-DUR) 10 MEQ tablet Take 10 mEq by mouth daily.    . rosuvastatin (CRESTOR) 20 MG tablet Take 20 mg by mouth daily.    . Tiotropium Bromide Monohydrate (SPIRIVA RESPIMAT) 2.5 MCG/ACT AERS Inhale 2 puffs into the lungs daily.    . VENTOLIN HFA 108 (90 BASE) MCG/ACT inhaler INHALE ONE PUFF EVERY 6 HOURS AS NEEDED FOR SHORTNESS OF BREATH 18 g 2   No current facility-administered medications for this visit.  Allergies  Allergen Reactions  . Atorvastatin Itching    Itching and myaliga  . Meperidine Hcl Other (See Comments)    Not known  . Pravastatin Rash    rash    Social History   Social History  . Marital status: Widowed    Spouse name: N/A  . Number of children: N/A  . Years of education: N/A   Occupational History  . Not on file.   Social History Main Topics  . Smoking status: Current Every Day Smoker    Packs/day: 0.50    Years: 60.00  . Smokeless tobacco: Never Used     Comment: She lives in [place] with her significant other (Charles Hook)0  . Alcohol use Yes     Comment: Occassional  . Drug use: No  . Sexual activity: Not on file   Other Topics Concern  . Not on file   Social History Narrative   Lives in [place] with her SO - charles hook   Retired -Acupuncturist   Enjoys travel - live theater/shows     Family History  Problem Relation Age of Onset  . Heart attack Neg Hx   . Stroke Maternal Grandmother   . Hypertension Mother   . Stomach cancer Father   . Ulcers Mother     Review of Systems:  As stated in the HPI and otherwise negative.   BP (!) 144/82   Pulse (!) 55   Ht 5\' 2"  (1.575 m)   Wt 221 lb 12.8 oz (100.6 kg)   BMI 40.57 kg/m   Physical Examination: General: Well developed, well nourished, NAD  HEENT: OP clear, mucus membranes moist  SKIN: warm, dry. No rashes. Neuro: No focal deficits  Musculoskeletal: Muscle strength 5/5 all ext  Psychiatric: Mood and affect normal  Neck: No JVD, right carotid bruit, faint. No thyromegaly, no lymphadenopathy.  Lungs:Clear bilaterally, no wheezes, rhonci, crackles. Prolonged exp phase Cardiovascular: Regular rate and rhythm. No murmurs, gallops or rubs. Abdomen:Soft. Bowel sounds present. Non-tender.  Extremities: No lower extremity edema. Pulses are 2 + in the bilateral DP/PT.  Stress myoview 12/20/13: Stress Procedure: The patient received IV Lexiscan 0.4 mg over 15-seconds. Technetium 32m Sestamibi injected at 30-seconds. Quantitative spect images were obtained after a 45 minute delay. During the infusion, the patient complained of slight SOB. This resolved in recovery.  Stress ECG: No significant change from baseline ECG  QPS Raw Data Images: Normal; no motion artifact; normal heart/lung ratio. Stress Images: Normal homogeneous uptake in all areas of the myocardium. Rest Images: Normal homogeneous uptake in all areas of the myocardium. Subtraction (SDS): No evidence of ischemia. Transient Ischemic Dilatation (Normal <1.22): 1.08 Lung/Heart Ratio (Normal <0.45): 0.29  Quantitative Gated Spect Images QGS EDV: 58 ml QGS ESV: 10 ml  Impression Exercise Capacity: Lexiscan with no exercise. BP Response: Normal blood pressure response. Clinical Symptoms: No significant symptoms noted. ECG Impression:  No significant ST segment change suggestive of ischemia. Comparison with Prior Nuclear Study: No significant change from previous study Overall Impression: Normal stress nuclear study. LV Ejection Fraction: 82%. LV Wall Motion: NL LV Function; NL Wall Motion  EKG:  EKG is ordered today. The ekg ordered today demonstrates sinus rhythm with second degree AV block, type 1. Inferior Q -waves. Unchanged. Poor R wave progression precordial leads.   Recent Labs: No results found for requested labs within last 8760 hours.    Wt Readings from Last 3 Encounters:  01/31/17 221 lb 12.8 oz (100.6 kg)  11/23/15 217 lb 9.6 oz (98.7 kg)  11/11/14 219 lb (99.3 kg)     Other studies Reviewed: Additional studies/ records that were reviewed today include: . Review of the above records demonstrates:    Assessment and Plan:   1. CAD: She is having no chest pain suggestive of angina. She is s/p DES RCA in 2010 in setting of inferior MI, residual moderate LAD stenosis.. No ischemia on stress test January 2015. Continue ASA, statin, Plavix, beta blocker.   2. HYPERTENSION: BP controlled. No changes today.  Continue current meds.   3. HYPERLIPIDEMIA: Continue statin. Followed in primary care.   4. Carotid artery disease: Carotid artery dopplers 2015 with mild bilateral disease.   5. Tobacco abuse: Smoking cessation recommended. She does not wish to stop.    6. Second degree AV block, type 1: She has no dizziness or near syncope. Monitor for now. She will call us if she has more dizziness.   Current medicines are reviewed at length with the patient today.  The patient does not have concerns regarding medicines.  The following changes have been made:  no change  Labs/ tests ordered today include:   Orders Placed This Encounter  Procedures  . EKG 12-Lead     Disposition:   FU with me in 12  months   Signed, Verne Carrowhristopher McAlhany, MD 01/31/2017 10:16 AM    [facility] [address], [place], [place]  [address] Phone: [phone](336) [phone]; Fax: [phone]

## 2017-03-10 DIAGNOSIS — M9901 Segmental and somatic dysfunction of cervical region: Secondary | ICD-10-CM | POA: Diagnosis not present

## 2017-03-10 DIAGNOSIS — M9902 Segmental and somatic dysfunction of thoracic region: Secondary | ICD-10-CM | POA: Diagnosis not present

## 2017-03-10 DIAGNOSIS — M50322 Other cervical disc degeneration at C5-C6 level: Secondary | ICD-10-CM | POA: Diagnosis not present

## 2017-03-10 DIAGNOSIS — M5134 Other intervertebral disc degeneration, thoracic region: Secondary | ICD-10-CM | POA: Diagnosis not present

## 2017-03-17 DIAGNOSIS — M5134 Other intervertebral disc degeneration, thoracic region: Secondary | ICD-10-CM | POA: Diagnosis not present

## 2017-03-17 DIAGNOSIS — M50322 Other cervical disc degeneration at C5-C6 level: Secondary | ICD-10-CM | POA: Diagnosis not present

## 2017-03-17 DIAGNOSIS — M9902 Segmental and somatic dysfunction of thoracic region: Secondary | ICD-10-CM | POA: Diagnosis not present

## 2017-03-17 DIAGNOSIS — M9901 Segmental and somatic dysfunction of cervical region: Secondary | ICD-10-CM | POA: Diagnosis not present

## 2017-03-20 DIAGNOSIS — M50322 Other cervical disc degeneration at C5-C6 level: Secondary | ICD-10-CM | POA: Diagnosis not present

## 2017-03-20 DIAGNOSIS — M9901 Segmental and somatic dysfunction of cervical region: Secondary | ICD-10-CM | POA: Diagnosis not present

## 2017-03-20 DIAGNOSIS — I10 Essential (primary) hypertension: Secondary | ICD-10-CM | POA: Diagnosis not present

## 2017-03-20 DIAGNOSIS — J449 Chronic obstructive pulmonary disease, unspecified: Secondary | ICD-10-CM | POA: Diagnosis not present

## 2017-03-20 DIAGNOSIS — M5134 Other intervertebral disc degeneration, thoracic region: Secondary | ICD-10-CM | POA: Diagnosis not present

## 2017-03-20 DIAGNOSIS — M9902 Segmental and somatic dysfunction of thoracic region: Secondary | ICD-10-CM | POA: Diagnosis not present

## 2017-03-26 DIAGNOSIS — H40033 Anatomical narrow angle, bilateral: Secondary | ICD-10-CM | POA: Diagnosis not present

## 2017-03-26 DIAGNOSIS — H402221 Chronic angle-closure glaucoma, left eye, mild stage: Secondary | ICD-10-CM | POA: Diagnosis not present

## 2017-03-26 DIAGNOSIS — H2513 Age-related nuclear cataract, bilateral: Secondary | ICD-10-CM | POA: Diagnosis not present

## 2017-04-07 DIAGNOSIS — M50322 Other cervical disc degeneration at C5-C6 level: Secondary | ICD-10-CM | POA: Diagnosis not present

## 2017-04-07 DIAGNOSIS — M9903 Segmental and somatic dysfunction of lumbar region: Secondary | ICD-10-CM | POA: Diagnosis not present

## 2017-04-07 DIAGNOSIS — M5136 Other intervertebral disc degeneration, lumbar region: Secondary | ICD-10-CM | POA: Diagnosis not present

## 2017-04-07 DIAGNOSIS — M9901 Segmental and somatic dysfunction of cervical region: Secondary | ICD-10-CM | POA: Diagnosis not present

## 2017-04-09 DIAGNOSIS — M9903 Segmental and somatic dysfunction of lumbar region: Secondary | ICD-10-CM | POA: Diagnosis not present

## 2017-04-09 DIAGNOSIS — M50322 Other cervical disc degeneration at C5-C6 level: Secondary | ICD-10-CM | POA: Diagnosis not present

## 2017-04-09 DIAGNOSIS — M5136 Other intervertebral disc degeneration, lumbar region: Secondary | ICD-10-CM | POA: Diagnosis not present

## 2017-04-09 DIAGNOSIS — M9901 Segmental and somatic dysfunction of cervical region: Secondary | ICD-10-CM | POA: Diagnosis not present

## 2017-04-14 DIAGNOSIS — I872 Venous insufficiency (chronic) (peripheral): Secondary | ICD-10-CM | POA: Diagnosis not present

## 2017-07-15 DIAGNOSIS — E782 Mixed hyperlipidemia: Secondary | ICD-10-CM | POA: Diagnosis not present

## 2017-07-15 DIAGNOSIS — I2510 Atherosclerotic heart disease of native coronary artery without angina pectoris: Secondary | ICD-10-CM | POA: Diagnosis not present

## 2017-07-15 DIAGNOSIS — I10 Essential (primary) hypertension: Secondary | ICD-10-CM | POA: Diagnosis not present

## 2017-07-15 DIAGNOSIS — J449 Chronic obstructive pulmonary disease, unspecified: Secondary | ICD-10-CM | POA: Diagnosis not present

## 2017-07-15 DIAGNOSIS — H409 Unspecified glaucoma: Secondary | ICD-10-CM | POA: Diagnosis not present

## 2017-07-15 DIAGNOSIS — I252 Old myocardial infarction: Secondary | ICD-10-CM | POA: Diagnosis not present

## 2017-09-19 DIAGNOSIS — Z23 Encounter for immunization: Secondary | ICD-10-CM | POA: Diagnosis not present

## 2017-09-19 DIAGNOSIS — R7301 Impaired fasting glucose: Secondary | ICD-10-CM | POA: Diagnosis not present

## 2017-09-19 DIAGNOSIS — Z1389 Encounter for screening for other disorder: Secondary | ICD-10-CM | POA: Diagnosis not present

## 2017-09-19 DIAGNOSIS — I10 Essential (primary) hypertension: Secondary | ICD-10-CM | POA: Diagnosis not present

## 2017-09-19 DIAGNOSIS — J449 Chronic obstructive pulmonary disease, unspecified: Secondary | ICD-10-CM | POA: Diagnosis not present

## 2017-09-19 DIAGNOSIS — Z0000 Encounter for general adult medical examination without abnormal findings: Secondary | ICD-10-CM | POA: Diagnosis not present

## 2017-09-19 DIAGNOSIS — E782 Mixed hyperlipidemia: Secondary | ICD-10-CM | POA: Diagnosis not present

## 2017-09-19 DIAGNOSIS — R5383 Other fatigue: Secondary | ICD-10-CM | POA: Diagnosis not present

## 2017-09-19 DIAGNOSIS — Z6838 Body mass index (BMI) 38.0-38.9, adult: Secondary | ICD-10-CM | POA: Diagnosis not present

## 2017-09-24 ENCOUNTER — Encounter: Payer: Self-pay | Admitting: Cardiovascular Disease

## 2017-09-25 ENCOUNTER — Encounter: Payer: Self-pay | Admitting: Cardiovascular Disease

## 2017-09-25 ENCOUNTER — Ambulatory Visit (INDEPENDENT_AMBULATORY_CARE_PROVIDER_SITE_OTHER): Payer: Medicare Other | Admitting: Cardiovascular Disease

## 2017-09-25 VITALS — BP 176/80 | HR 65 | Ht 62.0 in | Wt 203.2 lb

## 2017-09-25 DIAGNOSIS — I6523 Occlusion and stenosis of bilateral carotid arteries: Secondary | ICD-10-CM | POA: Diagnosis not present

## 2017-09-25 DIAGNOSIS — E7800 Pure hypercholesterolemia, unspecified: Secondary | ICD-10-CM

## 2017-09-25 DIAGNOSIS — Z720 Tobacco use: Secondary | ICD-10-CM

## 2017-09-25 DIAGNOSIS — I10 Essential (primary) hypertension: Secondary | ICD-10-CM | POA: Diagnosis not present

## 2017-09-25 DIAGNOSIS — I2510 Atherosclerotic heart disease of native coronary artery without angina pectoris: Secondary | ICD-10-CM | POA: Diagnosis not present

## 2017-09-25 NOTE — Progress Notes (Signed)
Chief Complaint  Patient presents with  . Follow-up    CAD    History of Present Illness: 83 yo female with history of CAD, HTN, HLD, COPD and DM here today cardiac follow up. In July of 2010 she had an inferior MI treated with a DES in the right coronary artery. She had residual 70% LAD stenosis and an ejection fraction of 60%. She also has chronic shortness of breath and moderate chronic obstructive pulmonary disease by pulmonary function testing. She has continued to smoke despite advice against this.  Stress myoview 12/20/13 with no ischemia, normal LV function.   She is here today for follow up. The patient denies any chest pain, dyspnea, palpitations, lower extremity edema, orthopnea, PND, dizziness, near syncope or syncope. She is feeling well overall. She has been under much stress as her husband has had a stroke. She has had house problems with plumbing.   Primary Care Physician: Kirby Funk, MD   Past Medical History:  Diagnosis Date  . ACUT DUOD ULCER W/HEMORR&PERF W/O MENTION OBST 10/05/2009   NSAID induced  . ALLERGIC RHINITIS CAUSE UNSPECIFIED   . ANEMIA-NOS   . CAD (coronary artery disease) 06/2009   DEs RCA with 70% LAD and EF 60%  . COPD    mild obst on PFTs 03/2010  . Diabetes mellitus 06/2010 dx   Mild, diet controlled  . GLAUCOMA   . HYPERLIPIDEMIA   . HYPERTENSION, BENIGN   . MYOCARDIAL INFARCTION 06/2009   des to rca  . TOBACCO ABUSE     Past Surgical History:  Procedure Laterality Date  . HEMORRHOID SURGERY  1990  . Right knee surgery      Current Outpatient Prescriptions  Medication Sig Dispense Refill  . ANORO ELLIPTA 62.5-25 MCG/INH AEPB Inhale 1 puff into the lungs every morning.    Marland Kitchen aspirin 81 MG tablet Take 81 mg by mouth daily.      . clopidogrel (PLAVIX) 75 MG tablet TAKE ONE TABLET EVERY DAY 30 tablet 11  . dorzolamide (TRUSOPT) 2 % ophthalmic solution 1 drop 2 (two) times daily.      . famotidine (PEPCID) 20 MG tablet Take 20 mg by mouth  2 (two) times daily.    . Ferrous Sulfate Dried (FEOSOL) 200 (65 FE) MG TABS Take by mouth daily as needed.      . gabapentin (NEURONTIN) 100 MG capsule Take 100 mg by mouth daily.    Marland Kitchen latanoprost (XALATAN) 0.005 % ophthalmic solution Place 1 drop into both eyes at bedtime.      . magnesium hydroxide (MILK OF MAGNESIA) 400 MG/5ML suspension Take by mouth daily.      . metolazone (ZAROXOLYN) 2.5 MG tablet Take 2.5 mg by mouth daily.    . metoprolol succinate (TOPROL-XL) 25 MG 24 hr tablet Take 25 mg by mouth daily.    Marland Kitchen NITROSTAT 0.4 MG SL tablet DISSOLVE ONE TABLET UNDER TONGUE AS NEEDED FOR CHEST PAIN - MAY REPEAT TWICE-IF NO RELIEF GO TO NEAREST HOSPITAL ER 25 tablet 0  . rosuvastatin (CRESTOR) 20 MG tablet Take 20 mg by mouth daily.    . VENTOLIN HFA 108 (90 BASE) MCG/ACT inhaler INHALE ONE PUFF EVERY 6 HOURS AS NEEDED FOR SHORTNESS OF BREATH 18 g 2   No current facility-administered medications for this visit.     Allergies  Allergen Reactions  . Atorvastatin Itching    Itching and myaliga  . Meperidine Hcl Other (See Comments)    Not known  .  Pravastatin Rash    rash    Social History   Social History  . Marital status: Widowed    Spouse name: N/A  . Number of children: N/A  . Years of education: N/A   Occupational History  . Not on file.   Social History Main Topics  . Smoking status: Current Every Day Smoker    Packs/day: 0.50    Years: 60.00  . Smokeless tobacco: Never Used     Comment: She lives in [place] with her significant other (Charles Hook)0  . Alcohol use Yes     Comment: Occassional  . Drug use: No  . Sexual activity: Not on file   Other Topics Concern  . Not on file   Social History Narrative   Lives in [place] with her SO - charles hook   Retired -Acupuncturistconvenince store clerk/cashier   Enjoys travel - live theater/shows    Family History  Problem Relation Age of Onset  . Hypertension Mother   . Ulcers Mother   . Stomach cancer Father   . Stroke  Maternal Grandmother   . Heart attack Neg Hx     Review of Systems:  As stated in the HPI and otherwise negative.   BP (!) 176/80   Pulse 65   Ht 5\' 2"  (1.575 m)   Wt 203 lb 3.2 oz (92.2 kg)   SpO2 95%   BMI 37.17 kg/m   Physical Examination:  General: Well developed, well nourished, NAD  HEENT: OP clear, mucus membranes moist  SKIN: warm, dry. No rashes. Neuro: No focal deficits  Musculoskeletal: Muscle strength 5/5 all ext  Psychiatric: Mood and affect normal  Neck: No JVD, no carotid bruits, no thyromegaly, no lymphadenopathy.  Lungs:Clear bilaterally, no wheezes, rhonci, crackles Cardiovascular: Regular rate and rhythm. No murmurs, gallops or rubs. Abdomen:Soft. Bowel sounds present. Non-tender.  Extremities: No lower extremity edema. Pulses are 2 + in the bilateral DP/PT.  Stress myoview 12/20/13: Stress Procedure: The patient received IV Lexiscan 0.4 mg over 15-seconds. Technetium 7424m Sestamibi injected at 30-seconds. Quantitative spect images were obtained after a 45 minute delay. During the infusion, the patient complained of slight SOB. This resolved in recovery.  Stress ECG: No significant change from baseline ECG  QPS Raw Data Images: Normal; no motion artifact; normal heart/lung ratio. Stress Images: Normal homogeneous uptake in all areas of the myocardium. Rest Images: Normal homogeneous uptake in all areas of the myocardium. Subtraction (SDS): No evidence of ischemia. Transient Ischemic Dilatation (Normal <1.22): 1.08 Lung/Heart Ratio (Normal <0.45): 0.29  Quantitative Gated Spect Images QGS EDV: 58 ml QGS ESV: 10 ml  Impression Exercise Capacity: Lexiscan with no exercise. BP Response: Normal blood pressure response. Clinical Symptoms: No significant symptoms noted. ECG Impression: No significant ST segment change suggestive of ischemia. Comparison with Prior Nuclear Study: No significant change from previous study Overall Impression:  Normal stress nuclear study. LV Ejection Fraction: 82%. LV Wall Motion: NL LV Function; NL Wall Motion  EKG:  EKG is not ordered today. The ekg ordered today demonstrates   Recent Labs: No results found for requested labs within last 8760 hours.    Wt Readings from Last 3 Encounters:  09/25/17 203 lb 3.2 oz (92.2 kg)  01/31/17 221 lb 12.8 oz (100.6 kg)  11/23/15 217 lb 9.6 oz (98.7 kg)     Other studies Reviewed: Additional studies/ records that were reviewed today include: . Review of the above records demonstrates:    Assessment and Plan:  1. CAD without angina: No chest pain suggestive of angina. She knows that her CAD will progress as she is still smoking. Fortunately, she has had no symptoms to suggest progression of her CAD. Will continue ASA, statin, Plavix and beta blocker. She will call if she has any chest pain.    2. HYPERTENSION: BP is elevated today. She reports good control at home. No changes.   3. HYPERLIPIDEMIA: Lipids followed in primary care. Continue statin.   4. Carotid artery disease: Mild bilateral carotid disease by dopplers 2015.    5. Tobacco abuse: I have counseled her on the importance of tobacco cessation.   6. Second degree AV block, type 1: No dizziness or syncope. Continue to follow.    Current medicines are reviewed at length with the patient today.  The patient does not have concerns regarding medicines.  The following changes have been made:  no change  Labs/ tests ordered today include:   No orders of the defined types were placed in this encounter.  Disposition:   FU with me in 12  months  Signed, Verne Carrow, MD 09/25/2017 3:59 PM    [facility] [address] [place], [place], [place]  [address] Phone: [phone]; Fax: [phone]

## 2017-09-25 NOTE — Patient Instructions (Signed)

## 2017-09-26 DIAGNOSIS — H402221 Chronic angle-closure glaucoma, left eye, mild stage: Secondary | ICD-10-CM | POA: Diagnosis not present

## 2017-09-26 DIAGNOSIS — H2513 Age-related nuclear cataract, bilateral: Secondary | ICD-10-CM | POA: Diagnosis not present

## 2017-10-02 DIAGNOSIS — R0789 Other chest pain: Secondary | ICD-10-CM | POA: Diagnosis not present

## 2018-02-19 DIAGNOSIS — L03114 Cellulitis of left upper limb: Secondary | ICD-10-CM | POA: Diagnosis not present

## 2018-03-12 DIAGNOSIS — F439 Reaction to severe stress, unspecified: Secondary | ICD-10-CM | POA: Diagnosis not present

## 2018-03-12 DIAGNOSIS — H9221 Otorrhagia, right ear: Secondary | ICD-10-CM | POA: Diagnosis not present

## 2018-03-12 DIAGNOSIS — I10 Essential (primary) hypertension: Secondary | ICD-10-CM | POA: Diagnosis not present

## 2018-03-12 DIAGNOSIS — J449 Chronic obstructive pulmonary disease, unspecified: Secondary | ICD-10-CM | POA: Diagnosis not present

## 2018-03-12 DIAGNOSIS — M79601 Pain in right arm: Secondary | ICD-10-CM | POA: Diagnosis not present

## 2018-03-26 DIAGNOSIS — H402221 Chronic angle-closure glaucoma, left eye, mild stage: Secondary | ICD-10-CM | POA: Diagnosis not present

## 2018-03-26 DIAGNOSIS — H2512 Age-related nuclear cataract, left eye: Secondary | ICD-10-CM | POA: Diagnosis not present

## 2018-03-26 DIAGNOSIS — H25811 Combined forms of age-related cataract, right eye: Secondary | ICD-10-CM | POA: Diagnosis not present

## 2018-04-23 DIAGNOSIS — M5134 Other intervertebral disc degeneration, thoracic region: Secondary | ICD-10-CM | POA: Diagnosis not present

## 2018-04-23 DIAGNOSIS — M9902 Segmental and somatic dysfunction of thoracic region: Secondary | ICD-10-CM | POA: Diagnosis not present

## 2018-04-23 DIAGNOSIS — M9901 Segmental and somatic dysfunction of cervical region: Secondary | ICD-10-CM | POA: Diagnosis not present

## 2018-04-23 DIAGNOSIS — M50322 Other cervical disc degeneration at C5-C6 level: Secondary | ICD-10-CM | POA: Diagnosis not present

## 2018-07-27 DIAGNOSIS — M9905 Segmental and somatic dysfunction of pelvic region: Secondary | ICD-10-CM | POA: Diagnosis not present

## 2018-07-27 DIAGNOSIS — M9903 Segmental and somatic dysfunction of lumbar region: Secondary | ICD-10-CM | POA: Diagnosis not present

## 2018-07-27 DIAGNOSIS — M9904 Segmental and somatic dysfunction of sacral region: Secondary | ICD-10-CM | POA: Diagnosis not present

## 2018-07-27 DIAGNOSIS — M5136 Other intervertebral disc degeneration, lumbar region: Secondary | ICD-10-CM | POA: Diagnosis not present

## 2018-07-28 DIAGNOSIS — M9904 Segmental and somatic dysfunction of sacral region: Secondary | ICD-10-CM | POA: Diagnosis not present

## 2018-07-28 DIAGNOSIS — M9905 Segmental and somatic dysfunction of pelvic region: Secondary | ICD-10-CM | POA: Diagnosis not present

## 2018-07-28 DIAGNOSIS — M5136 Other intervertebral disc degeneration, lumbar region: Secondary | ICD-10-CM | POA: Diagnosis not present

## 2018-07-28 DIAGNOSIS — M9903 Segmental and somatic dysfunction of lumbar region: Secondary | ICD-10-CM | POA: Diagnosis not present

## 2018-07-30 DIAGNOSIS — M9903 Segmental and somatic dysfunction of lumbar region: Secondary | ICD-10-CM | POA: Diagnosis not present

## 2018-07-30 DIAGNOSIS — M5136 Other intervertebral disc degeneration, lumbar region: Secondary | ICD-10-CM | POA: Diagnosis not present

## 2018-07-30 DIAGNOSIS — M9904 Segmental and somatic dysfunction of sacral region: Secondary | ICD-10-CM | POA: Diagnosis not present

## 2018-07-30 DIAGNOSIS — M9905 Segmental and somatic dysfunction of pelvic region: Secondary | ICD-10-CM | POA: Diagnosis not present

## 2018-08-03 DIAGNOSIS — M9904 Segmental and somatic dysfunction of sacral region: Secondary | ICD-10-CM | POA: Diagnosis not present

## 2018-08-03 DIAGNOSIS — M9905 Segmental and somatic dysfunction of pelvic region: Secondary | ICD-10-CM | POA: Diagnosis not present

## 2018-08-03 DIAGNOSIS — M9903 Segmental and somatic dysfunction of lumbar region: Secondary | ICD-10-CM | POA: Diagnosis not present

## 2018-08-03 DIAGNOSIS — M5136 Other intervertebral disc degeneration, lumbar region: Secondary | ICD-10-CM | POA: Diagnosis not present

## 2018-09-02 DIAGNOSIS — Z23 Encounter for immunization: Secondary | ICD-10-CM | POA: Diagnosis not present

## 2018-09-28 DIAGNOSIS — R7301 Impaired fasting glucose: Secondary | ICD-10-CM | POA: Diagnosis not present

## 2018-09-28 DIAGNOSIS — I10 Essential (primary) hypertension: Secondary | ICD-10-CM | POA: Diagnosis not present

## 2018-09-28 DIAGNOSIS — F17210 Nicotine dependence, cigarettes, uncomplicated: Secondary | ICD-10-CM | POA: Diagnosis not present

## 2018-09-28 DIAGNOSIS — E782 Mixed hyperlipidemia: Secondary | ICD-10-CM | POA: Diagnosis not present

## 2018-09-28 DIAGNOSIS — J449 Chronic obstructive pulmonary disease, unspecified: Secondary | ICD-10-CM | POA: Diagnosis not present

## 2018-09-28 DIAGNOSIS — I2510 Atherosclerotic heart disease of native coronary artery without angina pectoris: Secondary | ICD-10-CM | POA: Diagnosis not present

## 2018-09-28 DIAGNOSIS — Z0000 Encounter for general adult medical examination without abnormal findings: Secondary | ICD-10-CM | POA: Diagnosis not present

## 2018-09-28 DIAGNOSIS — Z1389 Encounter for screening for other disorder: Secondary | ICD-10-CM | POA: Diagnosis not present

## 2018-09-30 DIAGNOSIS — H2512 Age-related nuclear cataract, left eye: Secondary | ICD-10-CM | POA: Diagnosis not present

## 2018-09-30 DIAGNOSIS — H25811 Combined forms of age-related cataract, right eye: Secondary | ICD-10-CM | POA: Diagnosis not present

## 2018-09-30 DIAGNOSIS — H402221 Chronic angle-closure glaucoma, left eye, mild stage: Secondary | ICD-10-CM | POA: Diagnosis not present

## 2018-10-05 ENCOUNTER — Encounter (HOSPITAL_COMMUNITY): Payer: Self-pay

## 2018-10-05 ENCOUNTER — Emergency Department (HOSPITAL_COMMUNITY)
Admission: EM | Admit: 2018-10-05 | Discharge: 2018-10-05 | Disposition: A | Discharge status: Home / Self Care | Payer: Medicare Other | Attending: Emergency Medicine | Admitting: Emergency Medicine

## 2018-10-05 ENCOUNTER — Other Ambulatory Visit: Payer: Self-pay

## 2018-10-05 ENCOUNTER — Emergency Department (HOSPITAL_COMMUNITY): Payer: Medicare Other

## 2018-10-05 DIAGNOSIS — S51812A Laceration without foreign body of left forearm, initial encounter: Secondary | ICD-10-CM | POA: Diagnosis not present

## 2018-10-05 DIAGNOSIS — W541XXA Struck by dog, initial encounter: Secondary | ICD-10-CM | POA: Insufficient documentation

## 2018-10-05 DIAGNOSIS — E119 Type 2 diabetes mellitus without complications: Secondary | ICD-10-CM | POA: Diagnosis not present

## 2018-10-05 DIAGNOSIS — Y92008 Other place in unspecified non-institutional (private) residence as the place of occurrence of the external cause: Secondary | ICD-10-CM | POA: Diagnosis not present

## 2018-10-05 DIAGNOSIS — M545 Low back pain: Secondary | ICD-10-CM | POA: Diagnosis not present

## 2018-10-05 DIAGNOSIS — Y999 Unspecified external cause status: Secondary | ICD-10-CM | POA: Diagnosis not present

## 2018-10-05 DIAGNOSIS — W19XXXA Unspecified fall, initial encounter: Secondary | ICD-10-CM

## 2018-10-05 DIAGNOSIS — Y939 Activity, unspecified: Secondary | ICD-10-CM | POA: Diagnosis not present

## 2018-10-05 DIAGNOSIS — Z7902 Long term (current) use of antithrombotics/antiplatelets: Secondary | ICD-10-CM | POA: Diagnosis not present

## 2018-10-05 DIAGNOSIS — S098XXA Other specified injuries of head, initial encounter: Secondary | ICD-10-CM | POA: Diagnosis not present

## 2018-10-05 DIAGNOSIS — I2510 Atherosclerotic heart disease of native coronary artery without angina pectoris: Secondary | ICD-10-CM | POA: Insufficient documentation

## 2018-10-05 DIAGNOSIS — R52 Pain, unspecified: Secondary | ICD-10-CM | POA: Diagnosis not present

## 2018-10-05 DIAGNOSIS — Z7982 Long term (current) use of aspirin: Secondary | ICD-10-CM | POA: Diagnosis not present

## 2018-10-05 DIAGNOSIS — M5489 Other dorsalgia: Secondary | ICD-10-CM | POA: Diagnosis not present

## 2018-10-05 DIAGNOSIS — M546 Pain in thoracic spine: Secondary | ICD-10-CM | POA: Diagnosis not present

## 2018-10-05 DIAGNOSIS — R0781 Pleurodynia: Secondary | ICD-10-CM | POA: Diagnosis not present

## 2018-10-05 DIAGNOSIS — S299XXA Unspecified injury of thorax, initial encounter: Secondary | ICD-10-CM | POA: Diagnosis not present

## 2018-10-05 DIAGNOSIS — I10 Essential (primary) hypertension: Secondary | ICD-10-CM | POA: Diagnosis not present

## 2018-10-05 DIAGNOSIS — S199XXA Unspecified injury of neck, initial encounter: Secondary | ICD-10-CM | POA: Diagnosis not present

## 2018-10-05 DIAGNOSIS — Z79899 Other long term (current) drug therapy: Secondary | ICD-10-CM | POA: Insufficient documentation

## 2018-10-05 DIAGNOSIS — J449 Chronic obstructive pulmonary disease, unspecified: Secondary | ICD-10-CM | POA: Diagnosis not present

## 2018-10-05 DIAGNOSIS — F17200 Nicotine dependence, unspecified, uncomplicated: Secondary | ICD-10-CM | POA: Diagnosis not present

## 2018-10-05 DIAGNOSIS — S0990XA Unspecified injury of head, initial encounter: Secondary | ICD-10-CM | POA: Diagnosis not present

## 2018-10-05 DIAGNOSIS — W540XXA Bitten by dog, initial encounter: Secondary | ICD-10-CM

## 2018-10-05 DIAGNOSIS — S41152A Open bite of left upper arm, initial encounter: Secondary | ICD-10-CM | POA: Diagnosis not present

## 2018-10-05 DIAGNOSIS — M542 Cervicalgia: Secondary | ICD-10-CM | POA: Diagnosis not present

## 2018-10-05 MED ORDER — ACETAMINOPHEN 325 MG PO TABS
650.0000 mg | ORAL_TABLET | Freq: Once | ORAL | Status: AC
  Administered 2018-10-05: 650 mg via ORAL
  Filled 2018-10-05: qty 2

## 2018-10-05 MED ORDER — AMOXICILLIN-POT CLAVULANATE 875-125 MG PO TABS
1.0000 | ORAL_TABLET | Freq: Once | ORAL | Status: AC
  Administered 2018-10-05: 1 via ORAL
  Filled 2018-10-05: qty 1

## 2018-10-05 MED ORDER — AMOXICILLIN-POT CLAVULANATE 875-125 MG PO TABS: 1.0000 | ORAL_TABLET | Freq: Two times a day (BID) | ORAL | 0 refills | Status: DC

## 2018-10-05 MED ORDER — ACETAMINOPHEN 325 MG PO TABS
325.0000 mg | ORAL_TABLET | Freq: Once | ORAL | Status: AC
  Administered 2018-10-05: 325 mg via ORAL
  Filled 2018-10-05: qty 1

## 2018-10-05 MED ORDER — LIDOCAINE-EPINEPHRINE (PF) 2 %-1:200000 IJ SOLN
10.0000 mL | Freq: Once | INTRAMUSCULAR | Status: DC
  Filled 2018-10-05: qty 20

## 2018-10-05 NOTE — ED Notes (Signed)
Patient verbalizes understanding of discharge instructions. Opportunity for questioning and answers were provided. Armband removed by staff, pt discharged from ED ambulatory with daughter.    

## 2018-10-05 NOTE — ED Notes (Signed)
Pt assisted to bathroom by ED staff.

## 2018-10-05 NOTE — Discharge Instructions (Signed)
Please read and follow all provided instructions.  Your diagnoses today include:  1. Dog bite, initial encounter   2. Fall, initial encounter   3. Injury of head, initial encounter     Tests performed today include:  Vital signs. See below for your results today.   CT of your brain and neck -shows some degenerative disc disease, no emergency injuries  Medications prescribed:   Augmentin - antibiotic  You have been prescribed an antibiotic medicine: take the entire course of medicine even if you are feeling better. Stopping early can cause the antibiotic not to work.  Take any prescribed medications only as directed.   Home care instructions:  Follow any educational materials contained in this packet. Keep affected area above the level of your heart when possible. Wash area gently twice a day with warm soapy water. Do not apply alcohol or hydrogen peroxide. Cover the area if it draining or weeping.   Follow-up instructions: See your doctor in 1 week if symptoms are not improving.  Return instructions:  Return to the Emergency Department if you have:  Fever  Worsening symptoms  Worsening pain  Worsening swelling  Redness of the skin that moves away from the affected area, especially if it streaks away from the affected area   Any other emergent concerns  Your vital signs today were: BP (!) 165/107    Pulse 62    Temp 98 F (36.7 C) (Oral)    Resp 18    Ht 5\' 2"  (1.575 m)    Wt 96.2 kg    SpO2 93%    BMI 38.78 kg/m  If your blood pressure (BP) was elevated above 135/85 this visit, please have this repeated by your doctor within one month. --------------

## 2018-10-05 NOTE — ED Provider Notes (Signed)
[facility] EMERGENCY DEPARTMENT Provider Note   CSN: [id number] Arrival date & time: 10/05/18  1242     History   Chief Complaint Chief Complaint  Patient presents with  . Fall  . Animal Bite    HPI Jenny [name] is a 84 y.o. female with a past medical history of CAD, HTN, COPD, DM, currently anticoagulated with Plavix, who presents to ED for evaluation of fall that occurred after a dog bite just prior to arrival.  States that she was at her home when 1 of her son's dogs jumped on her, bit her arm and caused her to fall backwards through the gait and off the porch.  Reports was approximately 6 inches off the ground.  States that she first fell on her back and then hit the back of her head on the ground.  Denies any loss of consciousness.  States that she had a headache after she hit her head.  Reports no changes to her baseline vision issues.  She now complains of pain to her left arm at the site of the bite as well as mid back pain.  She has not tried ambulating since the fall, she ambulates with a walker at baseline.  Took her medications this morning including Plavix.  States that last tetanus was within the past year.  She is unsure if her son's dog was UTD on vaccines.  HPI  Past Medical History:  Diagnosis Date  . ACUT DUOD ULCER W/HEMORR&PERF W/O MENTION OBST 10/05/2009   NSAID induced  . ALLERGIC RHINITIS CAUSE UNSPECIFIED   . ANEMIA-NOS   . CAD (coronary artery disease) 06/2009   DEs RCA with 70% LAD and EF 60%  . COPD    mild obst on PFTs 03/2010  . Diabetes mellitus 06/2010 dx   Mild, diet controlled  . GLAUCOMA   . HYPERLIPIDEMIA   . HYPERTENSION, BENIGN   . MYOCARDIAL INFARCTION 06/2009   des to rca  . TOBACCO ABUSE     Patient Active Problem List   Diagnosis Date Noted  . Obese   . Dyspepsia 04/05/2011  . ALLERGIC RHINITIS CAUSE UNSPECIFIED 10/16/2010  . COPD 05/21/2010  . HYPERTENSION, BENIGN 04/06/2010  . EDEMA 03/09/2010  . ANEMIA-NOS  10/11/2009  . GLAUCOMA 10/11/2009  . CAD, NATIVE VESSEL 10/03/2009  . HYPERLIPIDEMIA 07/12/2009  . TOBACCO ABUSE 07/12/2009  . MYOCARDIAL INFARCTION 07/12/2009  . Type 2 diabetes, diet controlled (HCC) 07/12/2009    Past Surgical History:  Procedure Laterality Date  . HEMORRHOID SURGERY  1990  . Right knee surgery       OB History   None      Home Medications    Prior to Admission medications   Medication Sig Start Date End Date Taking? Authorizing Provider  ANORO ELLIPTA 62.5-25 MCG/INH AEPB Inhale 1 puff into the lungs every morning. 09/12/17  Yes [provider]  aspirin 81 MG tablet Take 81 mg by mouth daily.     Yes [provider]  clopidogrel (PLAVIX) 75 MG tablet TAKE ONE TABLET EVERY DAY 01/24/14  Yes Newt Lukes, MD  dorzolamide (TRUSOPT) 2 % ophthalmic solution 1 drop 2 (two) times daily.     Yes [provider]  latanoprost (XALATAN) 0.005 % ophthalmic solution Place 1 drop into both eyes at bedtime.     Yes [provider]  metoprolol succinate (TOPROL-XL) 25 MG 24 hr tablet Take 25 mg by mouth daily.   Yes [provider]  NITROSTAT 0.4 MG SL tablet DISSOLVE ONE TABLET UNDER TONGUE AS NEEDED FOR CHEST PAIN - MAY REPEAT TWICE-IF NO RELIEF GO TO NEAREST HOSPITAL ER Patient taking differently: Place 0.4 mg under the tongue every 5 (five) minutes as needed for chest pain.  05/06/13  Yes Newt Lukes, MD  rosuvastatin (CRESTOR) 20 MG tablet Take 20 mg by mouth daily.   Yes [provider]  famotidine (PEPCID) 20 MG tablet Take 20 mg by mouth 2 (two) times daily.    [provider]  furosemide (LASIX) 40 MG tablet Take 40 mg by mouth as needed for fluid. 09/16/18   [provider]  magnesium hydroxide (MILK OF MAGNESIA) 400 MG/5ML suspension Take by mouth daily.      [provider]  VENTOLIN HFA 108 (90 BASE) MCG/ACT inhaler INHALE ONE PUFF EVERY 6 HOURS AS NEEDED FOR SHORTNESS OF  BREATH Patient taking differently: Inhale 1 puff into the lungs every 6 (six) hours as needed for shortness of breath.  02/23/14   Newt Lukes, MD    Family History Family History  Problem Relation Age of Onset  . Hypertension Mother   . Ulcers Mother   . Stomach cancer Father   . Stroke Maternal Grandmother   . Heart attack Neg Hx     Social History Social History   Tobacco Use  . Smoking status: Current Every Day Smoker    Packs/day: 0.50    Years: 60.00    Pack years: 30.00  . Smokeless tobacco: Never Used  . Tobacco comment: She lives in [place] with her significant other (Charles Hook)0  Substance Use Topics  . Alcohol use: Yes    Comment: Occassional  . Drug use: No     Allergies   Atorvastatin; Meperidine hcl; and Pravastatin   Review of Systems Review of Systems  Constitutional: Negative for appetite change, chills and fever.  HENT: Negative for ear pain, rhinorrhea, sneezing and sore throat.   Eyes: Negative for photophobia and visual disturbance.  Respiratory: Negative for cough, chest tightness, shortness of breath and wheezing.   Cardiovascular: Negative for chest pain and palpitations.  Gastrointestinal: Negative for abdominal pain, blood in stool, constipation, diarrhea, nausea and vomiting.  Genitourinary: Negative for dysuria, hematuria and urgency.  Musculoskeletal: Positive for back pain and myalgias.  Skin: Positive for wound. Negative for rash.  Neurological: Negative for dizziness, weakness and light-headedness.     Physical Exam Updated Vital Signs BP (!) 231/102 (BP Location: Left Arm)   Pulse 75   Temp 98 F (36.7 C) (Oral)   Resp 19   Ht 5\' 2"  (1.575 m)   Wt 96.2 kg   SpO2 98%   BMI 38.78 kg/m   Physical Exam  Constitutional: She is oriented to person, place, and time. She appears well-developed and well-nourished. No distress.  HENT:  Head: Normocephalic and atraumatic.  Nose: Nose normal.  Eyes: Pupils are equal, round,  and reactive to light. Conjunctivae and EOM are normal. Right eye exhibits no discharge. Left eye exhibits no discharge. No scleral icterus.  Neck: Normal range of motion. Neck supple.  Cardiovascular: Normal rate, regular rhythm, normal heart sounds and intact distal pulses. Exam reveals no gallop and no friction rub.  No murmur heard. Pulmonary/Chest: Effort normal and breath sounds normal. No respiratory distress.  Abdominal: Soft. Bowel sounds are normal. She exhibits no distension. There is no tenderness. There is no guarding.  Musculoskeletal: Normal range of motion. She exhibits tenderness (Thoracic spine  at midline). She exhibits no edema.  No midline spinal tenderness present in lumbar or cervical spine. No step-off palpated. No visible bruising, edema or temperature change noted. No objective signs of numbness present. No saddle anesthesia. 2+ DP pulses bilaterally. Sensation intact to light touch. Strength 5/5 in bilateral lower extremities.  Neurological: She is alert and oriented to person, place, and time. No cranial nerve deficit or sensory deficit. She exhibits normal muscle tone. Coordination normal.  Pupils reactive. No facial asymmetry noted. Cranial nerves appear grossly intact. Sensation intact to light touch on face.  Skin: Skin is warm and dry. No rash noted.  Puncture wounds noted to left forearm.  Skin tear noted near left elbow.  Psychiatric: She has a normal mood and affect.  Nursing note and vitals reviewed.      ED Treatments / Results  Labs (all labs ordered are listed, but only abnormal results are displayed) Labs Reviewed - No data to display  EKG None  Radiology Dg Thoracic Spine 2 View  Result Date: 10/05/2018 CLINICAL DATA:  Pain and lacerations LEFT forearm, knocked down by a dog today, fell from porch to the ground on her back, bitten LEFT forearm, mid back pain at thoracic region, initial encounter EXAM: THORACIC SPINE 2 VIEWS COMPARISON:  Chest  radiograph 07/07/2009 FINDINGS: Twelve pairs of ribs. Bones appear demineralized. Multilevel disc space narrowing and endplate spur formation. Vertebral body heights maintained without fracture or subluxation. Atherosclerotic calcification aorta. IMPRESSION: Multilevel degenerative disc disease changes thoracic spine. No acute abnormalities. Electronically Signed   By: Ulyses Southward M.D.   On: 10/05/2018 14:36   Dg Forearm Left  Result Date: 10/05/2018 CLINICAL DATA:  Knocked down by dog today.  Pain. EXAM: LEFT FOREARM - 2 VIEW COMPARISON:  LEFT wrist radiographs July 25, 2011 FINDINGS: There is no evidence of acute fracture or other focal bone lesions. Old distal radial and ulnar fractures. Mid to distal dorsal forearm soft tissue swelling and skin irregularity compatible with laceration. No subcutaneous gas or radiopaque foreign bodies. IMPRESSION: Mid to distal forearm soft tissue swelling and laceration. No acute osseous process. Old distal radioulnar fractures. Electronically Signed   By: Awilda Metro M.D.   On: 10/05/2018 14:36    Procedures Procedures (including critical care time)  Medications Ordered in ED Medications  lidocaine-EPINEPHrine (XYLOCAINE W/EPI) 2 %-1:200000 (PF) injection 10 mL ( Infiltration Canceled Entry 10/05/18 1527)  amoxicillin-clavulanate (AUGMENTIN) 875-125 MG per tablet 1 tablet (has no administration in time range)  acetaminophen (TYLENOL) tablet 650 mg (650 mg Oral Given 10/05/18 1525)     Initial Impression / Assessment and Plan / ED Course  I have reviewed the triage vital signs and the nursing notes.  Pertinent labs & imaging results that were available during my care of the patient were reviewed by me and considered in my medical decision making (see chart for details).  Clinical Course as of Oct 05 1556  Mon Oct 05, 2018  1450 Patient returned from CT scan.  Unable to obtain CT secondary to pain.  Patient states that at her baseline, she is  usually unable to lay down for long periods of time.  Offered her fentanyl for pain.  However, she would prefer Tylenol.  Will give Tylenol, reassess.   [HK]  1517 Son is at bedside.  States that his dog is up-to-date on vaccinations.   [HK]    Clinical Course User Index [HK] Dietrich Pates, PA-C    84 year old female currently anticoagulated on Plavix,  with a past medical history of CAD, hypertension presents to ED for fall that occurred after a dog bite prior to arrival.  She was at home with her son's dog when it jumped up, bit her on the left arm which caused her to fall backwards, land on the ground off of the porch.  States that she hit her back and then her head.  She reports pain at the site of the wound as well as her mid back.  Denies any loss of consciousness.  She had a headache immediately after the incident but has since resolved.  She states that her tetanus shot is up-to-date.  She took all of her medications including Plavix this morning.  Son at bedside states that the dog is up-to-date on vaccines including rabies as well.  On exam there is tenderness palpation of the T-spine.  There are several puncture wounds and skin tears noted on the left forearm and one near the elbow.  Bleeding is controlled with pressure.  X-ray of the forearm shows soft tissue swelling and laceration with no acute process.  X-ray of the thoracic spine is negative.  I ordered a CT of the head and neck after my initial evaluation.  However, patient states that she has difficulty laying down for long periods of time at her baseline which has worsened after the fall due to the pain.  She would like Tylenol for pain.  Will administer this, reassess and then try to obtain CT imaging then.  I feel that this will be necessary based on her head injury and anticoagulation.  Wound irrigated here. Ambulatory with assistance. Will apply quick clot gauze on arm wound that continues to ooze blood.  Care handed off to oncoming  provider, PA Geiple pending remainder of imaging. If negative, will discharge home with Augmentin. Patient discussed with and seen by my attending, Dr. Criss Alvine.   Portions of this note were generated with Scientist, clinical (histocompatibility and immunogenetics). Dictation errors may occur despite best attempts at proofreading.   Final Clinical Impressions(s) / ED Diagnoses   Final diagnoses:  Dog bite, initial encounter  Fall, initial encounter  Injury of head, initial encounter    ED Discharge Orders    None       Dietrich Pates, PA-C 10/05/18 1701    Pricilla Loveless, MD 10/06/18 [phone]

## 2018-10-05 NOTE — ED Provider Notes (Signed)
4:59 PM patient handoff from [facility] at shift change.   Patient with dog bite to the arm.  Patient fell backwards striking her head.  She had a wound on her arm that was bleeding, currently controlled with quick clot and bandaging.  Patient pending head CT and neck CT.  States that Tylenol helped her pain an hour ago however her pain is returning and she is uncomfortable.  She will only take Tylenol and will not have her CT until her pain is better controlled.  Additional 325 mg of Tylenol ordered to be given prior to CT.  BP (!) 165/107   Pulse 62   Temp 98 F (36.7 C) (Oral)   Resp 19   Ht 5\' 2"  (1.575 m)   Wt 96.2 kg   SpO2 93%   BMI 38.78 kg/m   6:38 PM imaging reviewed with patient and daughter at bedside.  Patient will continue to use Tylenol for pain.  Discussed importance of deep breathing.  Patient does have COPD.  Pt urged to return with worsening pain, worsening swelling, expanding area of redness or streaking up extremity, fever, or any other concerns. Urged to take complete course of antibiotics as prescribed. Pt verbalizes understanding and agrees with plan.    Renne Crigler, PA-C 10/05/18 1839    Mancel Bale, MD 10/06/18 1540

## 2018-10-05 NOTE — ED Notes (Signed)
ED Provider at bedside. 

## 2018-10-05 NOTE — ED Triage Notes (Signed)
Pt arrives to ED from her son's house with complaints of falling after one of her son's dogs jumped up on her and bit her arm and caused her to fall, hit her head (is on blood thinners- Plavix), and then fall off the porch, which was approx 6inches off the ground since this morning. EMS reports pt is alert and oriented, left arm has some bleeding but it is controlled, no visible injury to head. Pt placed in position of comfort with bed locked and lowered, call bell in reach.

## 2018-10-06 DIAGNOSIS — S41112D Laceration without foreign body of left upper arm, subsequent encounter: Secondary | ICD-10-CM | POA: Diagnosis not present

## 2018-10-06 DIAGNOSIS — W540XXD Bitten by dog, subsequent encounter: Secondary | ICD-10-CM | POA: Diagnosis not present

## 2018-10-14 ENCOUNTER — Encounter: Payer: Self-pay | Admitting: Cardiovascular Disease

## 2018-10-14 ENCOUNTER — Ambulatory Visit (INDEPENDENT_AMBULATORY_CARE_PROVIDER_SITE_OTHER): Payer: Medicare Other | Admitting: Cardiovascular Disease

## 2018-10-14 VITALS — BP 170/78 | HR 70 | Ht 62.0 in | Wt 213.8 lb

## 2018-10-14 DIAGNOSIS — I10 Essential (primary) hypertension: Secondary | ICD-10-CM | POA: Diagnosis not present

## 2018-10-14 DIAGNOSIS — E7800 Pure hypercholesterolemia, unspecified: Secondary | ICD-10-CM

## 2018-10-14 DIAGNOSIS — Z720 Tobacco use: Secondary | ICD-10-CM

## 2018-10-14 DIAGNOSIS — I6523 Occlusion and stenosis of bilateral carotid arteries: Secondary | ICD-10-CM | POA: Diagnosis not present

## 2018-10-14 DIAGNOSIS — I2510 Atherosclerotic heart disease of native coronary artery without angina pectoris: Secondary | ICD-10-CM

## 2018-10-14 NOTE — Progress Notes (Signed)
Chief Complaint  Patient presents with  . Follow-up    CAD    History of Present Illness: 84 yo female with history of CAD, HTN, HLD, COPD, tobacco abuse, carotid artery disease, Mobitz 1 heart block and DM here today cardiac follow up. In July of 2010 she had an inferior MI treated with a drug eluting stent in the right coronary artery. She had residual 70% LAD stenosis and an ejection fraction of 60%. She also has chronic shortness of breath and moderate chronic obstructive pulmonary disease by pulmonary function testing. She has continued to smoke despite advice against this.  Stress myoview 12/20/13 with no ischemia, normal LV function. Mild bilateral carotid artery disease by dopplers in November 2015.   She is here today for follow up. The patient denies any chest pain, palpitations, lower extremity edema, orthopnea, PND, dizziness, near syncope or syncope. She has baseline dyspnea.   Primary Care Physician: Kirby Funk, MD   Past Medical History:  Diagnosis Date  . ACUT DUOD ULCER W/HEMORR&PERF W/O MENTION OBST 10/05/2009   NSAID induced  . ALLERGIC RHINITIS CAUSE UNSPECIFIED   . ANEMIA-NOS   . CAD (coronary artery disease) 06/2009   DEs RCA with 70% LAD and EF 60%  . COPD    mild obst on PFTs 03/2010  . Diabetes mellitus 06/2010 dx   Mild, diet controlled  . GLAUCOMA   . HYPERLIPIDEMIA   . HYPERTENSION, BENIGN   . MYOCARDIAL INFARCTION 06/2009   des to rca  . TOBACCO ABUSE     Past Surgical History:  Procedure Laterality Date  . HEMORRHOID SURGERY  1990  . Right knee surgery      Current Outpatient Medications  Medication Sig Dispense Refill  . amoxicillin-clavulanate (AUGMENTIN) 875-125 MG tablet Take 1 tablet by mouth every 12 (twelve) hours. 10 tablet 0  . ANORO ELLIPTA 62.5-25 MCG/INH AEPB Inhale 1 puff into the lungs every morning.    . clopidogrel (PLAVIX) 75 MG tablet TAKE ONE TABLET EVERY DAY 30 tablet 11  . dorzolamide (TRUSOPT) 2 % ophthalmic solution 1  drop 2 (two) times daily.      . famotidine (PEPCID) 20 MG tablet Take 20 mg by mouth 2 (two) times daily.    . furosemide (LASIX) 40 MG tablet Take 40 mg by mouth as needed for fluid.    Marland Kitchen latanoprost (XALATAN) 0.005 % ophthalmic solution Place 1 drop into both eyes at bedtime.      . magnesium hydroxide (MILK OF MAGNESIA) 400 MG/5ML suspension Take 30 mLs by mouth as needed for mild constipation.     . metoprolol succinate (TOPROL-XL) 25 MG 24 hr tablet Take 25 mg by mouth daily.    Marland Kitchen NITROSTAT 0.4 MG SL tablet DISSOLVE ONE TABLET UNDER TONGUE AS NEEDED FOR CHEST PAIN - MAY REPEAT TWICE-IF NO RELIEF GO TO NEAREST HOSPITAL ER (Patient taking differently: Place 0.4 mg under the tongue every 5 (five) minutes as needed for chest pain. ) 25 tablet 0  . potassium chloride SA (K-DUR,KLOR-CON) 20 MEQ tablet Take 20 mEq by mouth See admin instructions. Taking 1 tablet on same day if taking lasix 40mg     . rosuvastatin (CRESTOR) 20 MG tablet Take 20 mg by mouth daily.    . VENTOLIN HFA 108 (90 BASE) MCG/ACT inhaler INHALE ONE PUFF EVERY 6 HOURS AS NEEDED FOR SHORTNESS OF BREATH (Patient taking differently: Inhale 1 puff into the lungs every 6 (six) hours as needed for shortness of breath. ) 18  g 2   No current facility-administered medications for this visit.     Allergies  Allergen Reactions  . Atorvastatin Itching    Itching and myaliga  . Meperidine Hcl Other (See Comments)    Not known  . Pravastatin Rash    rash    Social History   Socioeconomic History  . Marital status: Widowed    Spouse name: Not on file  . Number of children: Not on file  . Years of education: Not on file  . Highest education level: Not on file  Occupational History  . Not on file  Social Needs  . Financial resource strain: Not on file  . Food insecurity:    Worry: Not on file    Inability: Not on file  . Transportation needs:    Medical: Not on file    Non-medical: Not on file  Tobacco Use  . Smoking  status: Current Every Day Smoker    Packs/day: 0.50    Years: 60.00    Pack years: 30.00  . Smokeless tobacco: Never Used  . Tobacco comment: She lives in [place] with her significant other (Charles Hook)0  Substance and Sexual Activity  . Alcohol use: Yes    Comment: Occassional  . Drug use: No  . Sexual activity: Not on file  Lifestyle  . Physical activity:    Days per week: Not on file    Minutes per session: Not on file  . Stress: Not on file  Relationships  . Social connections:    Talks on phone: Not on file    Gets together: Not on file    Attends religious service: Not on file    Active member of club or organization: Not on file    Attends meetings of clubs or organizations: Not on file    Relationship status: Not on file  . Intimate partner violence:    Fear of current or ex partner: Not on file    Emotionally abused: Not on file    Physically abused: Not on file    Forced sexual activity: Not on file  Other Topics Concern  . Not on file  Social History Narrative   Lives in [place] with her SO - charles hook   Retired -Acupuncturist   Enjoys travel - live theater/shows    Family History  Problem Relation Age of Onset  . Hypertension Mother   . Ulcers Mother   . Stomach cancer Father   . Stroke Maternal Grandmother   . Heart attack Neg Hx     Review of Systems:  As stated in the HPI and otherwise negative.   BP (!) 170/78   Pulse 70   Ht 5\' 2"  (1.575 m)   Wt 213 lb 12.8 oz (97 kg)   SpO2 97%   BMI 39.10 kg/m   Physical Examination:   General: Well developed, well nourished, NAD  HEENT: OP clear, mucus membranes moist  SKIN: warm, dry. No rashes. Neuro: No focal deficits  Musculoskeletal: Muscle strength 5/5 all ext  Psychiatric: Mood and affect normal  Neck: No JVD, no carotid bruits, no thyromegaly, no lymphadenopathy.  Lungs:Clear bilaterally, no wheezes, rhonci, crackles Cardiovascular: Regular rate and rhythm. No murmurs,  gallops or rubs. Abdomen:Soft. Bowel sounds present. Non-tender.  Extremities: No lower extremity edema. Pulses are 2 + in the bilateral DP/PT.  EKG:  EKG is  ordered today. The ekg ordered today demonstrates Sinus rhythm, rate 70 bpm. 1st degree AV block with  PACs. LAFB. Poor R wave progression through the precordial leads. Unchanged from previous EKG.   Recent Labs: No results found for requested labs within last 8760 hours.    Wt Readings from Last 3 Encounters:  10/14/18 213 lb 12.8 oz (97 kg)  10/05/18 212 lb (96.2 kg)  09/25/17 203 lb 3.2 oz (92.2 kg)     Other studies Reviewed: Additional studies/ records that were reviewed today include: . Review of the above records demonstrates:    Assessment and Plan:   1. CAD without angina: No chest pain. She has been known to have a moderate LAD stenosis since her cath in 2010 and has continued smoking since then. If she were to have recurrent chest pain, would go straight to cath. Continue Plavix, statin and beta blocker. Will stop ASA    2. HYPERTENSION: BP is elevated today. She has had stress at home the last few days. She will follow at home and call Dr. Valentina Lucks if it remains elevated.   3. HYPERLIPIDEMIA: Lipids followed in primary care. Will continue statin  4. Carotid artery disease: Mild bilateral carotid disease by dopplers 2015.  We discussed repeating this now but she wishes to wait until after the holidays.   5. Tobacco abuse: Smoking cessation is recommended.   6. Second degree AV block, type 1: No syncope or dizziness. Will follow.    Current medicines are reviewed at length with the patient today.  The patient does not have concerns regarding medicines.  The following changes have been made:  no change  Labs/ tests ordered today include:   No orders of the defined types were placed in this encounter.  Disposition:   FU with me in 12  months  Signed, Verne Carrow, MD 10/14/2018 10:31 AM    [facility]  [facility] [address] [place], [place], [place]  [address] Phone: [phone]; Fax: [phone]

## 2018-10-14 NOTE — Patient Instructions (Signed)
Medication Instructions:  Your physician has recommended you make the following change in your medication: Stop aspirin  If you need a refill on your cardiac medications before your next appointment, please call your pharmacy.   Lab work: none If you have labs (blood work) drawn today and your tests are completely normal, you will receive your results only by: Marland Kitchen MyChart Message (if you have MyChart) OR . A paper copy in the mail If you have any lab test that is abnormal or we need to change your treatment, we will call you to review the results.  Testing/Procedures: Your physician has requested that you have a carotid duplex. This test is an ultrasound of the carotid arteries in your neck. It looks at blood flow through these arteries that supply the brain with blood. Allow one hour for this exam. There are no restrictions or special instructions. To be done after January 1,2020  Follow-Up: At [facility], you and your health needs are our priority.  As part of our continuing mission to provide you with exceptional heart care, we have created designated Provider Care Teams.  These Care Teams include your primary Cardiologist (physician) and Advanced Practice Providers (APPs -  Physician Assistants and Nurse Practitioners) who all work together to provide you with the care you need, when you need it. You will need a follow up appointment in 12 months.  Please call our office 2 months in advance to schedule this appointment.  You may see Verne Carrow, MD or one of the following Advanced Practice Providers on your designated Care Team:   [place], PA-C Ronie Spies, PA-C . Jacolyn Reedy, PA-C  Any Other Special Instructions Will Be Listed Below (If Applicable).

## 2018-10-15 NOTE — Addendum Note (Signed)
Addended by: Vernard Gambles on: 10/15/2018 03:07 PM   Modules accepted: Orders

## 2018-11-13 ENCOUNTER — Other Ambulatory Visit: Payer: Self-pay | Admitting: Internal Medicine

## 2018-11-13 DIAGNOSIS — M7989 Other specified soft tissue disorders: Secondary | ICD-10-CM

## 2018-11-13 DIAGNOSIS — M79605 Pain in left leg: Secondary | ICD-10-CM | POA: Diagnosis not present

## 2018-11-16 ENCOUNTER — Ambulatory Visit
Admission: RE | Admit: 2018-11-16 | Discharge: 2018-11-16 | Disposition: A | Discharge status: Home / Self Care | Payer: Medicare Other | Source: Ambulatory Visit | Attending: Internal Medicine | Admitting: Internal Medicine

## 2018-11-16 ENCOUNTER — Other Ambulatory Visit: Payer: Self-pay

## 2018-11-16 DIAGNOSIS — M7989 Other specified soft tissue disorders: Secondary | ICD-10-CM | POA: Diagnosis not present

## 2018-12-14 ENCOUNTER — Ambulatory Visit (HOSPITAL_COMMUNITY)
Admission: RE | Admit: 2018-12-14 | Payer: Medicare Other | Source: Ambulatory Visit | Attending: Cardiovascular Disease | Admitting: Cardiovascular Disease

## 2018-12-17 ENCOUNTER — Ambulatory Visit (HOSPITAL_COMMUNITY)
Admission: RE | Admit: 2018-12-17 | Discharge: 2018-12-17 | Disposition: A | Discharge status: Home / Self Care | Payer: Medicare Other | Source: Ambulatory Visit | Attending: Cardiovascular Disease | Admitting: Cardiovascular Disease

## 2018-12-17 DIAGNOSIS — I6523 Occlusion and stenosis of bilateral carotid arteries: Secondary | ICD-10-CM | POA: Diagnosis not present

## 2019-03-01 DIAGNOSIS — J449 Chronic obstructive pulmonary disease, unspecified: Secondary | ICD-10-CM | POA: Diagnosis not present

## 2019-03-01 DIAGNOSIS — E782 Mixed hyperlipidemia: Secondary | ICD-10-CM | POA: Diagnosis not present

## 2019-03-01 DIAGNOSIS — H409 Unspecified glaucoma: Secondary | ICD-10-CM | POA: Diagnosis not present

## 2019-03-01 DIAGNOSIS — I252 Old myocardial infarction: Secondary | ICD-10-CM | POA: Diagnosis not present

## 2019-03-01 DIAGNOSIS — I10 Essential (primary) hypertension: Secondary | ICD-10-CM | POA: Diagnosis not present

## 2019-03-01 DIAGNOSIS — I2510 Atherosclerotic heart disease of native coronary artery without angina pectoris: Secondary | ICD-10-CM | POA: Diagnosis not present

## 2019-03-18 DIAGNOSIS — J449 Chronic obstructive pulmonary disease, unspecified: Secondary | ICD-10-CM | POA: Diagnosis not present

## 2019-03-18 DIAGNOSIS — E782 Mixed hyperlipidemia: Secondary | ICD-10-CM | POA: Diagnosis not present

## 2019-03-18 DIAGNOSIS — H409 Unspecified glaucoma: Secondary | ICD-10-CM | POA: Diagnosis not present

## 2019-03-18 DIAGNOSIS — I2510 Atherosclerotic heart disease of native coronary artery without angina pectoris: Secondary | ICD-10-CM | POA: Diagnosis not present

## 2019-03-18 DIAGNOSIS — M8580 Other specified disorders of bone density and structure, unspecified site: Secondary | ICD-10-CM | POA: Diagnosis not present

## 2019-03-18 DIAGNOSIS — I10 Essential (primary) hypertension: Secondary | ICD-10-CM | POA: Diagnosis not present

## 2019-03-18 DIAGNOSIS — I252 Old myocardial infarction: Secondary | ICD-10-CM | POA: Diagnosis not present

## 2019-04-02 DIAGNOSIS — I2510 Atherosclerotic heart disease of native coronary artery without angina pectoris: Secondary | ICD-10-CM | POA: Diagnosis not present

## 2019-04-02 DIAGNOSIS — I10 Essential (primary) hypertension: Secondary | ICD-10-CM | POA: Diagnosis not present

## 2019-04-02 DIAGNOSIS — J449 Chronic obstructive pulmonary disease, unspecified: Secondary | ICD-10-CM | POA: Diagnosis not present

## 2019-04-02 DIAGNOSIS — I252 Old myocardial infarction: Secondary | ICD-10-CM | POA: Diagnosis not present

## 2019-07-05 DIAGNOSIS — M9904 Segmental and somatic dysfunction of sacral region: Secondary | ICD-10-CM | POA: Diagnosis not present

## 2019-07-05 DIAGNOSIS — M9905 Segmental and somatic dysfunction of pelvic region: Secondary | ICD-10-CM | POA: Diagnosis not present

## 2019-07-05 DIAGNOSIS — M5136 Other intervertebral disc degeneration, lumbar region: Secondary | ICD-10-CM | POA: Diagnosis not present

## 2019-07-05 DIAGNOSIS — M9903 Segmental and somatic dysfunction of lumbar region: Secondary | ICD-10-CM | POA: Diagnosis not present

## 2019-07-08 DIAGNOSIS — E782 Mixed hyperlipidemia: Secondary | ICD-10-CM | POA: Diagnosis not present

## 2019-07-08 DIAGNOSIS — I252 Old myocardial infarction: Secondary | ICD-10-CM | POA: Diagnosis not present

## 2019-07-08 DIAGNOSIS — H409 Unspecified glaucoma: Secondary | ICD-10-CM | POA: Diagnosis not present

## 2019-07-08 DIAGNOSIS — J449 Chronic obstructive pulmonary disease, unspecified: Secondary | ICD-10-CM | POA: Diagnosis not present

## 2019-07-08 DIAGNOSIS — M8580 Other specified disorders of bone density and structure, unspecified site: Secondary | ICD-10-CM | POA: Diagnosis not present

## 2019-07-08 DIAGNOSIS — I2510 Atherosclerotic heart disease of native coronary artery without angina pectoris: Secondary | ICD-10-CM | POA: Diagnosis not present

## 2019-07-08 DIAGNOSIS — I10 Essential (primary) hypertension: Secondary | ICD-10-CM | POA: Diagnosis not present

## 2019-10-08 DIAGNOSIS — I252 Old myocardial infarction: Secondary | ICD-10-CM | POA: Diagnosis not present

## 2019-10-08 DIAGNOSIS — H409 Unspecified glaucoma: Secondary | ICD-10-CM | POA: Diagnosis not present

## 2019-10-08 DIAGNOSIS — I10 Essential (primary) hypertension: Secondary | ICD-10-CM | POA: Diagnosis not present

## 2019-10-08 DIAGNOSIS — J449 Chronic obstructive pulmonary disease, unspecified: Secondary | ICD-10-CM | POA: Diagnosis not present

## 2019-10-08 DIAGNOSIS — M8580 Other specified disorders of bone density and structure, unspecified site: Secondary | ICD-10-CM | POA: Diagnosis not present

## 2019-10-08 DIAGNOSIS — E782 Mixed hyperlipidemia: Secondary | ICD-10-CM | POA: Diagnosis not present

## 2019-10-08 DIAGNOSIS — I2510 Atherosclerotic heart disease of native coronary artery without angina pectoris: Secondary | ICD-10-CM | POA: Diagnosis not present

## 2019-10-12 DIAGNOSIS — Z23 Encounter for immunization: Secondary | ICD-10-CM | POA: Diagnosis not present

## 2019-12-12 NOTE — Progress Notes (Signed)
Chief Complaint  Patient presents with  . Follow-up    CAD   History of Present Illness: 86 yo female with history of CAD, HTN, HLD, COPD, tobacco abuse, carotid artery disease, Mobitz 1 heart block and DM here today cardiac follow up. In July of 2010 she had an inferior MI treated with a drug eluting stent in the right coronary artery. She had residual 70% LAD stenosis and an ejection fraction of 60%. She also has chronic shortness of breath and moderate chronic obstructive pulmonary disease by pulmonary function testing. She has continued to smoke despite advice against this.  Stress myoview 12/20/13 with no ischemia, normal LV function. Mild bilateral carotid artery disease by dopplers in January 2020.  She is here today for follow up. The patient denies any palpitations, lower extremity edema, orthopnea, PND, dizziness, near syncope or syncope. Chronic dyspnea. She had one episode of chest pain three months ago that resolved with NTG.   Primary Care Physician: Kirby Funk, MD  Past Medical History:  Diagnosis Date  . ACUT DUOD ULCER W/HEMORR&PERF W/O MENTION OBST 10/05/2009   NSAID induced  . ALLERGIC RHINITIS CAUSE UNSPECIFIED   . ANEMIA-NOS   . CAD (coronary artery disease) 06/2009   DEs RCA with 70% LAD and EF 60%  . COPD    mild obst on PFTs 03/2010  . Diabetes mellitus 06/2010 dx   Mild, diet controlled  . GLAUCOMA   . HYPERLIPIDEMIA   . HYPERTENSION, BENIGN   . MYOCARDIAL INFARCTION 06/2009   des to rca  . TOBACCO ABUSE     Past Surgical History:  Procedure Laterality Date  . HEMORRHOID SURGERY  1990  . Right knee surgery      Current Outpatient Medications  Medication Sig Dispense Refill  . ANORO ELLIPTA 62.5-25 MCG/INH AEPB Inhale 1 puff into the lungs every morning.    . ASPIRIN ADULT PO Take 85 mg by mouth daily.    . clopidogrel (PLAVIX) 75 MG tablet TAKE ONE TABLET EVERY DAY 30 tablet 11  . dorzolamide (TRUSOPT) 2 % ophthalmic solution 1 drop 2 (two) times  daily.      . famotidine (PEPCID) 20 MG tablet Take 20 mg by mouth 2 (two) times daily.    . furosemide (LASIX) 40 MG tablet Take 40 mg by mouth as needed for fluid.    Marland Kitchen latanoprost (XALATAN) 0.005 % ophthalmic solution Place 1 drop into both eyes at bedtime.      . magnesium hydroxide (MILK OF MAGNESIA) 400 MG/5ML suspension Take 30 mLs by mouth as needed for mild constipation.     Marland Kitchen NITROSTAT 0.4 MG SL tablet DISSOLVE ONE TABLET UNDER TONGUE AS NEEDED FOR CHEST PAIN - MAY REPEAT TWICE-IF NO RELIEF GO TO NEAREST HOSPITAL ER 25 tablet 0  . potassium chloride SA (K-DUR,KLOR-CON) 20 MEQ tablet Take 10 mEq by mouth See admin instructions. Taking 1 tablet on same day if taking lasix 40mg      . rosuvastatin (CRESTOR) 20 MG tablet Take 20 mg by mouth daily.    . VENTOLIN HFA 108 (90 BASE) MCG/ACT inhaler INHALE ONE PUFF EVERY 6 HOURS AS NEEDED FOR SHORTNESS OF BREATH 18 g 2  . metoprolol succinate (TOPROL-XL) 50 MG 24 hr tablet Take 1 tablet (50 mg total) by mouth daily. Take with or immediately following a meal. 90 tablet 3   No current facility-administered medications for this visit.    Allergies  Allergen Reactions  . Atorvastatin Itching    Itching  and myaliga  . Meperidine Hcl Other (See Comments)    Not known  . Pravastatin Rash    rash    Social History   Socioeconomic History  . Marital status: Widowed    Spouse name: Not on file  . Number of children: Not on file  . Years of education: Not on file  . Highest education level: Not on file  Occupational History  . Not on file  Tobacco Use  . Smoking status: Current Every Day Smoker    Packs/day: 0.50    Years: 60.00    Pack years: 30.00  . Smokeless tobacco: Never Used  . Tobacco comment: She lives in [place] with her significant other (Charles Hook)0  Substance and Sexual Activity  . Alcohol use: Yes    Comment: Occassional  . Drug use: No  . Sexual activity: Not on file  Other Topics Concern  . Not on file  Social  History Narrative   Lives in [place] with her SO - charles hook   Retired -Acupuncturist   Enjoys travel - live Engineer, maintenance (IT)   Social Determinants of Corporate investment banker Strain:   . Difficulty of Paying Living Expenses: Not on file  Food Insecurity:   . Worried About Programme researcher, broadcasting/film/video in the Last Year: Not on file  . Ran Out of Food in the Last Year: Not on file  Transportation Needs:   . Lack of Transportation (Medical): Not on file  . Lack of Transportation (Non-Medical): Not on file  Physical Activity:   . Days of Exercise per Week: Not on file  . Minutes of Exercise per Session: Not on file  Stress:   . Feeling of Stress : Not on file  Social Connections:   . Frequency of Communication with Friends and Family: Not on file  . Frequency of Social Gatherings with Friends and Family: Not on file  . Attends Religious Services: Not on file  . Active Member of Clubs or Organizations: Not on file  . Attends Banker Meetings: Not on file  . Marital Status: Not on file  Intimate Partner Violence:   . Fear of Current or Ex-Partner: Not on file  . Emotionally Abused: Not on file  . Physically Abused: Not on file  . Sexually Abused: Not on file    Family History  Problem Relation Age of Onset  . Hypertension Mother   . Ulcers Mother   . Stomach cancer Father   . Stroke Maternal Grandmother   . Heart attack Neg Hx     Review of Systems:  As stated in the HPI and otherwise negative.   BP (!) 172/86   Pulse (!) 112   Ht 5\' 2"  (1.575 m)   Wt 215 lb (97.5 kg)   SpO2 98%   BMI 39.32 kg/m   Physical Examination:  General: Well developed, well nourished, NAD  HEENT: OP clear, mucus membranes moist  SKIN: warm, dry. No rashes. Neuro: No focal deficits  Musculoskeletal: Muscle strength 5/5 all ext  Psychiatric: Mood and affect normal  Neck: No JVD, no carotid bruits, no thyromegaly, no lymphadenopathy.  Lungs:Clear bilaterally, no  wheezes, rhonci, crackles Cardiovascular: Regular rate and rhythm. No murmurs, gallops or rubs. Abdomen:Soft. Bowel sounds present. Non-tender.  Extremities: No lower extremity edema. Pulses are 2 + in the bilateral DP/PT.   EKG:  EKG is ordered today. The ekg ordered today demonstrates Sinus, 65 bpm. 1st degree AV block.  Recent Labs: No results found for requested labs within last 8760 hours.    Wt Readings from Last 3 Encounters:  12/13/19 215 lb (97.5 kg)  10/14/18 213 lb 12.8 oz (97 kg)  10/05/18 212 lb (96.2 kg)     Other studies Reviewed: Additional studies/ records that were reviewed today include: . Review of the above records demonstrates:   Assessment and Plan:   1. CAD without angina: She has been known to have a moderate LAD stenosis since her cath in 2010 and has continued smoking since then. No chest pain suggestive of angina. Will continue ASA, Plavix, statin and beta blocker.    2. HYPERTENSION: BP elevated. Will increase Toprol 50 mg per day. She will follow her BP at home  3. HYPERLIPIDEMIA: Lipids followed in primary care. Continue statin.   4. Carotid artery disease: Mild bilateral carotid disease by dopplers January 2020.    5. Tobacco abuse: Smoking cessation is recommended.   6. History of Second degree AV block, type 1: No dizziness or syncope.     Current medicines are reviewed at length with the patient today.  The patient does not have concerns regarding medicines.  The following changes have been made:  no change  Labs/ tests ordered today include:   Orders Placed This Encounter  Procedures  . EKG 12-Lead   Disposition:   FU with me in 12  months  Signed, Lauree Chandler, MD 12/13/2019 11:54 AM    [facility] [place], [place], [place]  [address] Phone: [phone]; Fax: [phone]

## 2019-12-13 ENCOUNTER — Encounter: Payer: Self-pay | Admitting: Cardiovascular Disease

## 2019-12-13 ENCOUNTER — Other Ambulatory Visit: Payer: Self-pay

## 2019-12-13 ENCOUNTER — Ambulatory Visit (INDEPENDENT_AMBULATORY_CARE_PROVIDER_SITE_OTHER): Payer: Medicare Other | Admitting: Cardiovascular Disease

## 2019-12-13 VITALS — BP 172/86 | HR 112 | Ht 62.0 in | Wt 215.0 lb

## 2019-12-13 DIAGNOSIS — I2510 Atherosclerotic heart disease of native coronary artery without angina pectoris: Secondary | ICD-10-CM | POA: Diagnosis not present

## 2019-12-13 DIAGNOSIS — I6523 Occlusion and stenosis of bilateral carotid arteries: Secondary | ICD-10-CM | POA: Diagnosis not present

## 2019-12-13 DIAGNOSIS — E7800 Pure hypercholesterolemia, unspecified: Secondary | ICD-10-CM | POA: Diagnosis not present

## 2019-12-13 DIAGNOSIS — I10 Essential (primary) hypertension: Secondary | ICD-10-CM | POA: Diagnosis not present

## 2019-12-13 DIAGNOSIS — Z720 Tobacco use: Secondary | ICD-10-CM

## 2019-12-13 MED ORDER — METOPROLOL SUCCINATE ER 50 MG PO TB24: 50.0000 mg | ORAL_TABLET | Freq: Every day | ORAL | 3 refills | Status: DC

## 2019-12-13 NOTE — Patient Instructions (Signed)
Medication Instructions:  Your physician has recommended you make the following change in your medication:  1.) increase metoprolol succinate (Toprol XL) to 50 mg daily  *If you need a refill on your cardiac medications before your next appointment, please call your pharmacy*  Lab Work: none If you have labs (blood work) drawn today and your tests are completely normal, you will receive your results only by: Marland Kitchen MyChart Message (if you have MyChart) OR . A paper copy in the mail If you have any lab test that is abnormal or we need to change your treatment, we will call you to review the results.  Testing/Procedures: none  Follow-Up: At [facility], you and your health needs are our priority.  As part of our continuing mission to provide you with exceptional heart care, we have created designated Provider Care Teams.  These Care Teams include your primary Cardiologist (physician) and Advanced Practice Providers (APPs -  Physician Assistants and Nurse Practitioners) who all work together to provide you with the care you need, when you need it.  Your next appointment:   12 month(s)  The format for your next appointment:   Either In Person or Virtual  Provider:   Verne Carrow, MD  Other Instructions

## 2020-01-07 DIAGNOSIS — M8580 Other specified disorders of bone density and structure, unspecified site: Secondary | ICD-10-CM | POA: Diagnosis not present

## 2020-01-07 DIAGNOSIS — I252 Old myocardial infarction: Secondary | ICD-10-CM | POA: Diagnosis not present

## 2020-01-07 DIAGNOSIS — H409 Unspecified glaucoma: Secondary | ICD-10-CM | POA: Diagnosis not present

## 2020-01-07 DIAGNOSIS — I10 Essential (primary) hypertension: Secondary | ICD-10-CM | POA: Diagnosis not present

## 2020-01-07 DIAGNOSIS — I2510 Atherosclerotic heart disease of native coronary artery without angina pectoris: Secondary | ICD-10-CM | POA: Diagnosis not present

## 2020-01-07 DIAGNOSIS — J449 Chronic obstructive pulmonary disease, unspecified: Secondary | ICD-10-CM | POA: Diagnosis not present

## 2020-01-07 DIAGNOSIS — E782 Mixed hyperlipidemia: Secondary | ICD-10-CM | POA: Diagnosis not present

## 2020-02-02 DIAGNOSIS — I252 Old myocardial infarction: Secondary | ICD-10-CM | POA: Diagnosis not present

## 2020-02-02 DIAGNOSIS — I10 Essential (primary) hypertension: Secondary | ICD-10-CM | POA: Diagnosis not present

## 2020-02-02 DIAGNOSIS — M8580 Other specified disorders of bone density and structure, unspecified site: Secondary | ICD-10-CM | POA: Diagnosis not present

## 2020-02-02 DIAGNOSIS — I2510 Atherosclerotic heart disease of native coronary artery without angina pectoris: Secondary | ICD-10-CM | POA: Diagnosis not present

## 2020-02-02 DIAGNOSIS — E782 Mixed hyperlipidemia: Secondary | ICD-10-CM | POA: Diagnosis not present

## 2020-02-02 DIAGNOSIS — J449 Chronic obstructive pulmonary disease, unspecified: Secondary | ICD-10-CM | POA: Diagnosis not present

## 2020-02-02 DIAGNOSIS — H409 Unspecified glaucoma: Secondary | ICD-10-CM | POA: Diagnosis not present

## 2020-02-11 DIAGNOSIS — J449 Chronic obstructive pulmonary disease, unspecified: Secondary | ICD-10-CM | POA: Diagnosis not present

## 2020-02-11 DIAGNOSIS — Z1389 Encounter for screening for other disorder: Secondary | ICD-10-CM | POA: Diagnosis not present

## 2020-02-11 DIAGNOSIS — R202 Paresthesia of skin: Secondary | ICD-10-CM | POA: Diagnosis not present

## 2020-02-11 DIAGNOSIS — R7301 Impaired fasting glucose: Secondary | ICD-10-CM | POA: Diagnosis not present

## 2020-02-11 DIAGNOSIS — F17210 Nicotine dependence, cigarettes, uncomplicated: Secondary | ICD-10-CM | POA: Diagnosis not present

## 2020-02-11 DIAGNOSIS — Z6841 Body Mass Index (BMI) 40.0 and over, adult: Secondary | ICD-10-CM | POA: Diagnosis not present

## 2020-02-11 DIAGNOSIS — I10 Essential (primary) hypertension: Secondary | ICD-10-CM | POA: Diagnosis not present

## 2020-02-11 DIAGNOSIS — Z716 Tobacco abuse counseling: Secondary | ICD-10-CM | POA: Diagnosis not present

## 2020-02-11 DIAGNOSIS — Z0000 Encounter for general adult medical examination without abnormal findings: Secondary | ICD-10-CM | POA: Diagnosis not present

## 2020-02-11 DIAGNOSIS — E782 Mixed hyperlipidemia: Secondary | ICD-10-CM | POA: Diagnosis not present

## 2020-03-20 DIAGNOSIS — L03116 Cellulitis of left lower limb: Secondary | ICD-10-CM | POA: Diagnosis not present

## 2020-03-23 ENCOUNTER — Other Ambulatory Visit: Payer: Self-pay

## 2020-03-23 ENCOUNTER — Encounter (HOSPITAL_BASED_OUTPATIENT_CLINIC_OR_DEPARTMENT_OTHER): Payer: Medicare Other | Attending: Internal Medicine | Admitting: Internal Medicine

## 2020-03-23 DIAGNOSIS — L97821 Non-pressure chronic ulcer of other part of left lower leg limited to breakdown of skin: Secondary | ICD-10-CM | POA: Insufficient documentation

## 2020-03-23 DIAGNOSIS — E11622 Type 2 diabetes mellitus with other skin ulcer: Secondary | ICD-10-CM | POA: Diagnosis not present

## 2020-03-23 DIAGNOSIS — I890 Lymphedema, not elsewhere classified: Secondary | ICD-10-CM | POA: Insufficient documentation

## 2020-03-23 DIAGNOSIS — I10 Essential (primary) hypertension: Secondary | ICD-10-CM | POA: Diagnosis not present

## 2020-03-23 DIAGNOSIS — I87332 Chronic venous hypertension (idiopathic) with ulcer and inflammation of left lower extremity: Secondary | ICD-10-CM | POA: Insufficient documentation

## 2020-03-23 DIAGNOSIS — J449 Chronic obstructive pulmonary disease, unspecified: Secondary | ICD-10-CM | POA: Diagnosis not present

## 2020-03-23 DIAGNOSIS — Z955 Presence of coronary angioplasty implant and graft: Secondary | ICD-10-CM | POA: Insufficient documentation

## 2020-03-23 DIAGNOSIS — I2510 Atherosclerotic heart disease of native coronary artery without angina pectoris: Secondary | ICD-10-CM | POA: Insufficient documentation

## 2020-03-23 DIAGNOSIS — E785 Hyperlipidemia, unspecified: Secondary | ICD-10-CM | POA: Diagnosis not present

## 2020-03-23 DIAGNOSIS — I872 Venous insufficiency (chronic) (peripheral): Secondary | ICD-10-CM | POA: Diagnosis present

## 2020-03-24 NOTE — Progress Notes (Signed)
Jenny [name], Jenny [name] ([id number]) Visit Report for 03/23/2020 Abuse/Suicide Risk Screen Details Patient Name: Date of Service: Jenny [name], Jenny [name] 03/23/2020 2:45 PM Medical Record Number:[id number] Patient Account Number: [id number] Date of Birth/Sex: Treating RN: Nov 13, 1933 (86 y.o. Clearnce Sorrel Primary Care Moesha Sarchet: Lavone Orn Other Clinician: Referring Jonel Weldon: Treating Pasha Gadison/Extender:Robson, Vista Lawman, Moses Manners in Treatment: 0 Abuse/Suicide Risk Screen Items Answer ABUSE RISK SCREEN: Has anyone close to you tried to hurt or harm you recentlyo No Do you feel uncomfortable with anyone in your familyo No Has anyone forced you do things that you didnt want to doo No Electronic Signature(s) Signed: 03/24/2020 4:21:34 PM By: Kela Millin Entered By: Kela Millin on 03/23/2020 15:16:47 -------------------------------------------------------------------------------- Activities of Daily Living Details Patient Name: Date of Service: Jenny [name], Jenny [name] 03/23/2020 2:45 PM Medical Record Number:[id number] Patient Account Number: [id number] Date of Birth/Sex: Treating RN: [birth year] (86 y.o. Clearnce Sorrel Primary Care Shakil Dirk: Lavone Orn Other Clinician: Referring Mase Dhondt: Treating Xiomara Sevillano/Extender:Robson, Vista Lawman, Moses Manners in Treatment: 0 Activities of Daily Living Items Answer Activities of Daily Living (Please select one for each item) Drive Automobile Completely Able Take Medications Completely Able Use Telephone Completely Able Care for Appearance Completely Able Use Toilet Completely Able Bath / Shower Completely Able Dress Self Completely Able Feed Self Completely Able Walk Completely Able Get In / Out Bed Completely Able Housework Completely Able Prepare Meals Completely Able Handle Money Completely Able Shop for Self Completely Able Electronic Signature(s) Signed: 03/24/2020 4:21:34 PM By: Kela Millin Entered By:  Kela Millin on 03/23/2020 15:17:06 -------------------------------------------------------------------------------- Education Screening Details Patient Name: Date of Service: Jenny [name] 03/23/2020 2:45 PM Medical Record [id number] Patient Account Number: [id number] Date of Birth/Sex: Treating RN: 1933-10-29 (86 y.o. Clearnce Sorrel Primary Care Sanyla Summey: Lavone Orn Other Clinician: Referring Izaak Sahr: Treating Ayianna Darnold/Extender:Robson, Vista Lawman, Moses Manners in Treatment: 0 Primary Learner Assessed: Patient Learning Preferences/Education Level/Primary Language Learning Preference: Explanation Highest Education Level: High School Preferred Language: English Cognitive Barrier Language Barrier: No Translator Needed: No Memory Deficit: No Emotional Barrier: No Cultural/Religious Beliefs Affecting Medical Care: No Physical Barrier Impaired Vision: Yes Glasses, Legally Blind, in right eye Knowledge/Comprehension Knowledge Level: Medium Comprehension Level: Medium Ability to understand written Medium instructions: Ability to understand verbal Medium instructions: Motivation Anxiety Level: Calm Cooperation: Cooperative Education Importance: Acknowledges Need Interest in Health Problems: Asks Questions Perception: Coherent Willingness to Engage in Self- Willingness to Engage in Self- High Management Activities: Readiness to Engage in Self- High Management Activities: Electronic Signature(s) Signed: 03/24/2020 4:21:34 PM By: Kela Millin Entered By: Kela Millin on 03/23/2020 15:17:48 -------------------------------------------------------------------------------- Fall Risk Assessment Details Patient Name: Date of Service: Jenny [name] 03/23/2020 2:45 PM Medical Record [id number] Patient Account Number: [id number] Date of Birth/Sex: Treating RN: 05/13/1933 (86 y.o. Clearnce Sorrel Primary Care Aylinn Rydberg: Lavone Orn Other Clinician: Referring Shamyah Stantz: Treating Camia Dipinto/Extender:Robson, Vista Lawman, Moses Manners in Treatment: 0 Fall Risk Assessment Items Have you had 2 or more falls in the last 12 monthso 0 No Have you had any fall that resulted in injury in the last 12 monthso 0 No FALLS RISK SCREEN History of falling - immediate or within 3 months 0 No Secondary diagnosis (Do you have 2 or more medical diagnoseso) 0 No Ambulatory aid None/bed rest/wheelchair/nurse 0 No Crutches/cane/walker 15 Yes Furniture 0 No Intravenous therapy Access/Saline/Heparin Lock 0 No Weak (short steps with or without shuffle, stooped but able to lift head 10 Yes while walking, may seek support from furniture) Impaired (short steps with shuffle, may have  difficulty arising from chair, 0 No head down, impaired balance) Mental Status Oriented to own ability 0 Yes Overestimates or forgets limitations 0 No Risk Level: Medium Risk Score: 25 Electronic Signature(s) Signed: 03/24/2020 4:21:34 PM By: Cherylin Mylar Entered By: Cherylin Mylar on 03/23/2020 15:18:18 -------------------------------------------------------------------------------- Foot Assessment Details Patient Name: Date of Service: Jenny [name] 03/23/2020 2:45 PM Medical Record [id number] Patient Account Number: [id number] Date of Birth/Sex: Treating RN: March 28, 1933 (86 y.o. Harvest Dark Primary Care Vidal Lampkins: Kirby Funk Other Clinician: Referring Phu Record: Treating Casten Floren/Extender:Robson, Alyson Locket, Vivianne Spence in Treatment: 0 Foot Assessment Items Site Locations + = Sensation present, - = Sensation absent, C = Callus, U = Ulcer R = Redness, W = Warmth, M = Maceration, PU = Pre-ulcerative lesion F = Fissure, S = Swelling, D = Dryness Assessment Right: Left: Other Deformity: No No Prior Foot Ulcer: No No Prior Amputation: No No Charcot Joint: No No Ambulatory Status: Ambulatory With Help Assistance  Device: Walker Gait: Steady Electronic Signature(s) Signed: 03/24/2020 4:21:34 PM By: Cherylin Mylar Entered By: Cherylin Mylar on 03/23/2020 15:18:43 -------------------------------------------------------------------------------- Nutrition Risk Screening Details Patient Name: Date of Service: Jenny [name], Jenny [name] 03/23/2020 2:45 PM Medical Record [id number] Patient Account Number: [id number] Date of Birth/Sex: Treating RN: Apr 15, 1933 (86 y.o. Harvest Dark Primary Care Nyleah Mcginnis: Kirby Funk Other Clinician: Referring Ijanae Macapagal: Treating Terianne Thaker/Extender:Robson, Alyson Locket, Vivianne Spence in Treatment: 0 Height (in): 62 Weight (lbs): 213 Body Mass Index (BMI): 39 Nutrition Risk Screening Items Score Screening NUTRITION RISK SCREEN: I have an illness or condition that made me change the kind and/or 0 No amount of food I eat I eat fewer than two meals per day 0 No I eat few fruits and vegetables, or milk products 0 No I have three or more drinks of beer, liquor or wine almost every day 0 No I have tooth or mouth problems that make it hard for me to eat 0 No I don't always have enough money to buy the food I need 0 No I eat alone most of the time 0 No I take three or more different prescribed or over-the-counter drugs a day 1 Yes 0 No Without wanting to, I have lost or gained 10 pounds in the last six months I am not always physically able to shop, cook and/or feed myself 0 No Nutrition Protocols Good Risk Protocol 0 No interventions needed Moderate Risk Protocol High Risk Proctocol Risk Level: Good Risk Score: 1 Electronic Signature(s) Signed: 03/24/2020 4:21:34 PM By: Cherylin Mylar Entered By: Cherylin Mylar on 03/23/2020 15:18:31

## 2020-03-25 NOTE — Progress Notes (Signed)
Jenny [name], Jenny [name] ([id number]) Visit Report for 03/23/2020 Chief Complaint Document Details Patient Name: Date of Service: Jenny [name], Jenny [name] 03/23/2020 2:45 PM Medical Record Number:[id number] Patient Account Number: [id number] Date of Birth/Sex: Treating RN: August 15, 1933 (86 y.o. Jenny [name], Jenny [name] Primary Care Provider: Kirby [name] Other Clinician: Referring Provider: Treating Provider/Extender:Jenny [name], Jenny [name], Jenny [name] in Treatment: [name] Information Obtained from: Patient Chief Complaint 03/23/2020; patient is here for review of wounds on her left lower leg Electronic Signature(s) Signed: 03/25/2020 6:55:41 AM By: Jenny Najjar MD Entered By: Jenny [name] on 03/23/2020 16:03:01 -------------------------------------------------------------------------------- HPI Details Patient Name: Date of Service: Jenny [name] 03/23/2020 2:45 PM Medical Record [id number] Patient Account Number: [id number] Date of Birth/Sex: Treating RN: 09-20-1933 (86 y.o. Jenny [name] Primary Care Provider: Kirby [name] Other Clinician: Referring Provider: Treating Provider/Extender:Jenny [name], Jenny [name], Jenny [name] in Treatment: [name] History of Present Illness HPI Description: ADMISSION 03/23/2020 This is an 86 year old woman who is independent. Roughly 5 to 6 [name] ago she noted skin breakdown on her lower legs. She tells me that she has had a long problem with dry itchy skin on her lower legs and some raised hyperkeratotic areas for as long as 4 years. She has tried various skin creams trying to get her skin to become less dry and stop itching. She has not been dressing her wounds with anything specifically. Past medical history includes COPD, coronary artery disease status post stent, continued tobacco abuse, hypertension, hyperlipidemia, type 2 diabetes mellitus and a duodenal ulcer ABIs in our clinic were [name].78 on Electronic Signature(s) Signed: 03/25/2020 6:55:41 AM By: Jenny Najjar  MD Entered By: Jenny [name] on 03/23/2020 16:04:20 -------------------------------------------------------------------------------- Physical Exam Details Patient Name: Date of Service: Jenny [name] 03/23/2020 2:45 PM Medical Record [id number] Patient Account Number: [id number] Date of Birth/Sex: Treating RN: 06-08-1933 (86 y.o. Jenny [name] Primary Care Provider: Kirby [name] Other Clinician: Referring Provider: Treating Provider/Extender:Jenny [name], Jenny [name], Jenny [name] in Treatment: [name] Constitutional Patient is hypertensive.. Pulse regular and within target range for patient.Marland Kitchen Respirations regular, non-labored and within target range.. Temperature is normal and within the target range for the patient.Marland Kitchen Appears in no distress. Respiratory work of breathing is normal. Bilateral breath sounds are clear and equal in all lobes with no wheezes, rales or rhonchi.. Cardiovascular Heart rhythm and rate regular, without murmur or gallop.. Pedal pulses are palpable. Psychiatric appears at normal baseline. Notes Wound exam; the area in question is on the left anterior lateral and posterior lower extremity small excoriated wounds that is superficial. She has extremely dry flaking skin with small raised scaly nodules left greater than right. Chronic erythema but without tenderness. Peripheral pulses are palpable Electronic Signature(s) Signed: 03/25/2020 6:55:41 AM By: Jenny Najjar MD Entered By: Jenny [name] on 03/23/2020 16:05:33 -------------------------------------------------------------------------------- Physician Orders Details Patient Name: Date of Service: Jenny [name] 03/23/2020 2:45 PM Medical Record [id number] Patient Account Number: [id number] Date of Birth/Sex: Treating RN: 07-16-1933 (86 y.o. Jenny [name] Primary Care Provider: Kirby [name] Other Clinician: Referring Provider: Treating Provider/Extender:Jenny [name], Jenny [name],  Jenny [name] in Treatment: [name] Verbal / Phone Orders: No Diagnosis Coding Follow-up Appointments Return Appointment in 2 [name]. - Thursday Nurse Visit: - Wednesday or Friday Dressing Change Frequency Do not change entire dressing for one week. Skin Barriers/Peri-Wound Care Moisturizing lotion - patient to lotion right leg three times a day. try Cetaphil lotion. TCA Cream or Ointment - mixed with lotion liberally to entire left leg. Wound Cleansing May shower with protection. - patient to use a cast protector.  Primary Wound Dressing Wound #1 Left,Lateral Lower Leg Calcium Alginate with Silver Wound #2 Left,Proximal,Posterior Lower Leg Calcium Alginate with Silver Wound #3 Left,Distal,Posterior Lower Leg Calcium Alginate with Silver Secondary Dressing Dry Gauze Edema Control 3 Layer Compression System - Left Lower Extremity Avoid standing for long periods of time Elevate legs to the level of the heart or above for 30 minutes daily and/or when sitting, a frequency of: - throughout the day. Exercise regularly Other: - next wound care visit leg measurements will be provided to patient in order to purchase compression stockings. Additional Orders / Instructions Stop/Decrease Smoking Electronic Signature(s) Signed: 03/23/2020 4:13:32 PM By: Jenny [name], Jenny [name] Signed: 03/25/2020 6:55:41 AM By: Jenny Najjarobson, Calem Cocozza MD Entered By: Jenny [name], Jenny [name] on 03/23/2020 15:54:34 -------------------------------------------------------------------------------- Problem List Details Patient Name: Date of Service: Jenny BalesJONES, [name] 03/23/2020 2:45 PM Medical Record [id number] Patient Account Number: [id number] Date of Birth/Sex: Treating RN: 1933-07-23 (86 y.o. Jenny [name]) Jenny [name], Jenny KendallBobbi Primary Care Provider: Kirby FunkGriffin, Jenny Other Clinician: Referring Provider: Treating Provider/Extender:Jenny [name], Jenny LocketMichael [name], Jenny SpenceJohn [name] in Treatment: [name] Active Problems ICD-10 Evaluated Encounter Code Description Active  Date Today Diagnosis L97.821 Non-pressure chronic ulcer of other part of left lower 03/23/2020 No Yes leg limited to breakdown of skin I87.332 Chronic venous hypertension (idiopathic) with ulcer 03/23/2020 No Yes and inflammation of left lower extremity I89.[name] Lymphedema, not elsewhere classified 03/23/2020 No Yes Inactive Problems Resolved Problems Electronic Signature(s) Signed: 03/25/2020 6:55:41 AM By: Jenny Najjarobson, Karsyn Jamie MD Entered By: Jenny Najjarobson, Taichi Repka on 03/23/2020 16:00:54 -------------------------------------------------------------------------------- Progress Note Details Patient Name: Date of Service: Jenny BalesJONES, [name] 03/23/2020 2:45 PM Medical Record [id number] Patient Account Number: [id number] Date of Birth/Sex: Treating RN: 1933-07-23 (86 y.o. Jenny SilenceF) Jenny [name], Jenny [name] Primary Care Provider: Kirby FunkGriffin, Jenny Other Clinician: Referring Provider: Treating Provider/Extender:Grasiela Jonsson, Jenny LocketMichael [name], Jenny SpenceJohn [name] in Treatment: [name] Subjective Chief Complaint Information obtained from Patient 03/23/2020; patient is here for review of wounds on her left lower leg History of Present Illness (HPI) ADMISSION 03/23/2020 This is an 86 year old woman who is independent. Roughly 5 to 6 [name] ago she noted skin breakdown on her lower legs. She tells me that she has had a long problem with dry itchy skin on her lower legs and some raised hyperkeratotic areas for as long as 4 years. She has tried various skin creams trying to get her skin to become less dry and stop itching. She has not been dressing her wounds with anything specifically. Past medical history includes COPD, coronary artery disease status post stent, continued tobacco abuse, hypertension, hyperlipidemia, type 2 diabetes mellitus and a duodenal ulcer ABIs in our clinic were [name].78 on Patient History Information obtained from Patient. Allergies Demerol, Flexeril, Darvon, atorvastatin calcium, adhesive tape Family History Cancer - Mother,  Hypertension - Mother,Father,Paternal Grandparents,Maternal Grandparents, Stroke - Maternal Grandparents,Paternal Grandparents, Thyroid Problems - Paternal Grandparents, No family history of Diabetes, Heart Disease, Hereditary Spherocytosis, Kidney Disease, Lung Disease, Seizures, Tuberculosis. Social History Current every day smoker - pack a day, Marital Status - Widowed, Alcohol Use - Never, Drug Use - No History, Caffeine Use - Daily. Medical History Eyes Patient has history of Cataracts, Glaucoma Denies history of Optic Neuritis Ear/Nose/Mouth/Throat Denies history of Chronic sinus problems/congestion, Middle ear problems Hematologic/Lymphatic Denies history of Anemia, Hemophilia, Human Immunodeficiency Virus, Lymphedema, Sickle Cell Disease Respiratory Patient has history of Asthma, Chronic Obstructive Pulmonary Disease (COPD) Denies history of Aspiration, Pneumothorax, Sleep Apnea, Tuberculosis Cardiovascular Patient has history of Coronary Artery Disease, Hypertension, Myocardial Infarction, Peripheral Venous Disease Denies history of Angina, Arrhythmia, Peripheral Arterial Disease, Phlebitis, Vasculitis Gastrointestinal Denies  history of Cirrhosis , Colitis, Crohnoos, Hepatitis A, Hepatitis B, Hepatitis C Endocrine Denies history of Type I Diabetes, Type II Diabetes Genitourinary Denies history of End Stage Renal Disease Immunological Denies history of Lupus Erythematosus, Raynaudoos, Scleroderma Integumentary (Skin) Denies history of History of Burn Musculoskeletal Denies history of Gout, Rheumatoid Arthritis, Osteoarthritis, Osteomyelitis Neurologic Denies history of Dementia, Neuropathy, Quadriplegia, Paraplegia, Seizure Disorder Oncologic Denies history of Received Chemotherapy, Received Radiation Psychiatric Denies history of Anorexia/bulimia, Confinement Anxiety Medical And Surgical History Notes 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 Eyes Complains or has symptoms of Vision Changes, Glasses / Contacts - glasses. Ear/Nose/Mouth/Throat Denies complaints or symptoms of Chronic sinus problems or rhinitis. Respiratory Denies complaints or symptoms of Chronic or frequent coughs, Shortness of Breath. Cardiovascular Denies complaints or symptoms of Chest pain. Gastrointestinal Denies complaints or symptoms of Frequent diarrhea, Nausea, Vomiting. Endocrine Denies complaints or symptoms of Heat/cold intolerance. Genitourinary Denies complaints or symptoms of Frequent urination. Integumentary (Skin) cellulitis Musculoskeletal Denies complaints or symptoms of Muscle Pain, Muscle Weakness. Neurologic Denies complaints or symptoms of Numbness/parasthesias. Psychiatric Denies complaints or symptoms of Claustrophobia, Suicidal. Objective Constitutional Patient is hypertensive.. Pulse regular and within target range for patient.Marland Kitchen Respirations regular, non-labored and within target range.. Temperature is normal and within the target range for the patient.Marland Kitchen Appears in no distress. Vitals Time Taken: 3:00 PM, Height: 62 in, Source: Stated, Weight: 213 lbs, Source: Stated, BMI: 39, Temperature: 97.6 F, Pulse: 58 bpm, Respiratory Rate: 19 breaths/min, Blood Pressure: 157/54 mmHg. Respiratory work of breathing is normal. Bilateral breath sounds are clear and equal in all lobes with no wheezes, rales or rhonchi.. Cardiovascular Heart rhythm and rate regular, without murmur or gallop.. Pedal pulses are palpable. Psychiatric appears at normal baseline. General Notes: Wound exam; the area in question is on the left anterior lateral and posterior lower extremity small excoriated wounds that is superficial. She has extremely dry flaking skin with small  raised scaly nodules left greater than right. Chronic erythema but without tenderness. Peripheral pulses are palpable Integumentary (Hair, Skin) Wound #1 status is Open. Original cause of wound was Gradually Appeared. The wound is located on the Left,Lateral Lower Leg. The wound measures [name].3cm length x [name].3cm width x [name].1cm depth; [name].071cm^2 area and [name].007cm^3 volume. There is Fat Layer (Subcutaneous Tissue) Exposed exposed. There is no tunneling or undermining noted. There is a medium amount of serous drainage noted. The wound margin is distinct with the outline attached to the wound base. There is large (67-100%) red, pink granulation within the wound bed. There is no necrotic tissue within the wound bed. Wound #2 status is Open. Original cause of wound was Gradually Appeared. The wound is located on the Left,Proximal,Posterior Lower Leg. The wound measures [name].5cm length x [name].5cm width x [name].1cm depth; [name].196cm^2 area and [name].02cm^3 volume. There is Fat Layer (Subcutaneous Tissue) Exposed exposed. There is no tunneling or undermining noted. There is a small amount of serous drainage noted. The wound margin is distinct with the outline attached to the wound base. There is large (67-100%) red, pink granulation within the wound bed. There is no necrotic tissue within the wound bed. Wound #3 status is Open. Original cause of wound was Gradually Appeared. The wound is located on the Left,Distal,Posterior Lower Leg. The wound measures [name].7cm length x [name].5cm width x [name].1cm depth; [name].275cm^2 area and [name].027cm^3 volume. There is  Fat Layer (Subcutaneous Tissue) Exposed exposed. There is no tunneling or undermining noted. There is a small amount of serous drainage noted. The wound margin is distinct with the outline attached to the wound base. There is large (67-100%) red, pink granulation within the wound bed. There is no necrotic tissue within the wound bed. Assessment Active Problems ICD-10 Non-pressure chronic  ulcer of other part of left lower leg limited to breakdown of skin Chronic venous hypertension (idiopathic) with ulcer and inflammation of left lower extremity Lymphedema, not elsewhere classified Procedures Wound #1 Pre-procedure diagnosis of Wound #1 is a Venous Leg Ulcer located on the Left,Lateral Lower Leg . There was a Three Layer Compression Therapy Procedure with a pre-treatment ABI of [name].8 by Zenaida Deed, RN. Post procedure Diagnosis Wound #1: Same as Pre-Procedure Wound #2 Pre-procedure diagnosis of Wound #2 is a Venous Leg Ulcer located on the Left,Proximal,Posterior Lower Leg . There was a Three Layer Compression Therapy Procedure with a pre-treatment ABI of [name].8 by Zenaida Deed, RN. Post procedure Diagnosis Wound #2: Same as Pre-Procedure Wound #3 Pre-procedure diagnosis of Wound #3 is a Venous Leg Ulcer located on the Left,Distal,Posterior Lower Leg . There was a Three Layer Compression Therapy Procedure with a pre-treatment ABI of [name].8 by Zenaida Deed, RN. Post procedure Diagnosis Wound #3: Same as Pre-Procedure Plan Follow-up Appointments: Return Appointment in 2 [name]. - Thursday Nurse Visit: - Wednesday or Friday Dressing Change Frequency: Do not change entire dressing for one week. Skin Barriers/Peri-Wound Care: Moisturizing lotion - patient to lotion right leg three times a day. try Cetaphil lotion. TCA Cream or Ointment - mixed with lotion liberally to entire left leg. Wound Cleansing: May shower with protection. - patient to use a cast protector. Primary Wound Dressing: Wound #1 Left,Lateral Lower Leg: Calcium Alginate with Silver Wound #2 Left,Proximal,Posterior Lower Leg: Calcium Alginate with Silver Wound #3 Left,Distal,Posterior Lower Leg: Calcium Alginate with Silver Secondary Dressing: Dry Gauze Edema Control: 3 Layer Compression System - Left Lower Extremity Avoid standing for long periods of time Elevate legs to the level of the heart or  above for 30 minutes daily and/or when sitting, a frequency of: - throughout the day. Exercise regularly Other: - next wound care visit leg measurements will be provided to patient in order to purchase compression stockings. Additional Orders / Instructions: Stop/Decrease Smoking 1. This patient has chronic venous hypertension with stasis dermatitis and secondary lymphedema. The skin in her distal lower leg and ankle area is tightly fibrotic and adherent. Very dry skin. Skin changes secondary to chronic lymphedema. 2. We will lotion her skin with TCA and moisturizer. Silver alginate to the areas in question that are still open under 3 layer compression. 3. The patient will ultimately need compression stockings 20/30 below-knee 4. I have talked to her about lotion in her skin in her lower legs I suggested Cetaphil but to be truthful of the area on the right might need a mixture of steroid and lubricating cream at least in the short-term after these wounds heal. I spent 30 minutes in the review of this patient's records, face-to-face evaluation and preparation of this record Electronic Signature(s) Signed: 03/25/2020 6:55:41 AM By: Jenny Najjar MD Entered By: Jenny [name] on 03/23/2020 16:07:18 -------------------------------------------------------------------------------- HxROS Details Patient Name: Date of Service: Jenny [name] 03/23/2020 2:45 PM Medical Record [id number] Patient Account Number: [id number] Date of Birth/Sex: Treating RN: Jan 05, 1933 (86 y.o. Harvest Dark Primary Care Provider: Kirby [name] Other Clinician: Referring Provider: Treating Provider/Extender:Marv Alfrey, Jenny [name], Jonny Ruiz  [name] in Treatment: [name] Information Obtained From Patient Constitutional Symptoms (General Health) Complaints and Symptoms: Negative for: Fatigue; Fever; Chills; Marked Weight Change Eyes Complaints and Symptoms: Positive for: Vision Changes; Glasses / Contacts -  glasses Medical History: Positive for: Cataracts; Glaucoma Negative for: Optic Neuritis Past Medical History Notes: blind in right eye Ear/Nose/Mouth/Throat Complaints and Symptoms: Negative for: Chronic sinus problems or rhinitis Medical History: Negative for: Chronic sinus problems/congestion; Middle ear problems Respiratory Complaints and Symptoms: Negative for: Chronic or frequent coughs; Shortness of Breath Medical History: Positive for: Asthma; Chronic Obstructive Pulmonary Disease (COPD) Negative for: Aspiration; Pneumothorax; Sleep Apnea; Tuberculosis Cardiovascular Complaints and Symptoms: Negative for: Chest pain Medical History: Positive for: Coronary Artery Disease; Hypertension; Myocardial Infarction; Peripheral Venous Disease Negative for: Angina; Arrhythmia; Peripheral Arterial Disease; Phlebitis; Vasculitis Gastrointestinal Complaints and Symptoms: Negative for: Frequent diarrhea; Nausea; Vomiting Medical History: Negative for: Cirrhosis ; Colitis; Crohns; Hepatitis A; Hepatitis B; Hepatitis C Past Medical History Notes: GI Bleed Endocrine Complaints and Symptoms: Negative for: Heat/cold intolerance Medical History: Negative for: Type I Diabetes; Type II Diabetes Genitourinary Complaints and Symptoms: Negative for: Frequent urination Medical History: Negative for: End Stage Renal Disease Musculoskeletal Complaints and Symptoms: Negative for: Muscle Pain; Muscle Weakness Medical History: Negative for: Gout; Rheumatoid Arthritis; Osteoarthritis; Osteomyelitis Past Medical History Notes: osteopenia Neurologic Complaints and Symptoms: Negative for: Numbness/parasthesias Medical History: Negative for: Dementia; Neuropathy; Quadriplegia; Paraplegia; Seizure Disorder Psychiatric Complaints and Symptoms: Negative for: Claustrophobia; Suicidal Medical History: Negative for: Anorexia/bulimia; Confinement Anxiety Hematologic/Lymphatic Medical  History: Negative for: Anemia; Hemophilia; Human Immunodeficiency Virus; Lymphedema; Sickle Cell Disease Immunological Medical History: Negative for: Lupus Erythematosus; Raynauds; Scleroderma Integumentary (Skin) Complaints and Symptoms: Review of System Notes: cellulitis Medical History: Negative for: History of Burn Oncologic Medical History: Negative for: Received Chemotherapy; Received Radiation HBO Extended History Items Eyes: Eyes: Cataracts Glaucoma Immunizations Pneumococcal Vaccine: Received Pneumococcal Vaccination: Yes Implantable Devices No devices added Family and Social History Cancer: Yes - Mother; Diabetes: No; Heart Disease: No; Hereditary Spherocytosis: No; Hypertension: Yes - Mother,Father,Paternal Grandparents,Maternal Grandparents; Kidney Disease: No; Lung Disease: No; Seizures: No; Stroke: Yes - Maternal Grandparents,Paternal Grandparents; Thyroid Problems: Yes - Paternal Grandparents; Tuberculosis: No; Current every day smoker - pack a day; Marital Status - Widowed; Alcohol Use: Never; Drug Use: No History; Caffeine Use: Daily; Financial Concerns: No; Food, Clothing or Shelter Needs: No; Support System Lacking: No; Transportation Concerns: No Electronic Signature(s) Signed: 03/24/2020 4:21:34 PM By: Cherylin Mylar Signed: 03/25/2020 6:55:41 AM By: Jenny Najjar MD Entered By: Cherylin Mylar on 03/23/2020 15:16:40 -------------------------------------------------------------------------------- SuperBill Details Patient Name: Date of Service: Jenny [name] 03/23/2020 Medical Record Number:[id number] Patient Account Number: [id number] Date of Birth/Sex: Treating RN: 1933/06/03 (86 y.o. Jenny [name], Jenny [name] Primary Care Provider: Kirby [name] Other Clinician: Referring Provider: Treating Provider/Extender:Aubriel Khanna, Jenny [name], Jenny [name] in Treatment: [name] Diagnosis Coding ICD-10 Codes Code Description [id number] Non-pressure chronic ulcer of other  part of left lower leg limited to breakdown of skin I87.332 Chronic venous hypertension (idiopathic) with ulcer and inflammation of left lower extremity I89.[name] Lymphedema, not elsewhere classified Facility Procedures The patient participates with Medicare or their insurance follows the Medicare Facility Guidelines: CPT4 Code Description Modifier Quantity [id number] [phone] - WOUND CARE VISIT-LEV 5 EST PT 1 The patient participates with Medicare or their insurance follows the Medicare Facility Guidelines: [id number] (Facility Use Only) [id number] - APPLY MULTLAY COMPRS LWR LT LEG 1 Physician Procedures Electronic Signature(s) Signed: 03/25/2020 6:55:41 AM By: Jenny Najjar MD Entered By: Jenny [name] on 03/23/2020 16:07:47

## 2020-03-29 ENCOUNTER — Other Ambulatory Visit: Payer: Self-pay

## 2020-03-29 ENCOUNTER — Encounter (HOSPITAL_BASED_OUTPATIENT_CLINIC_OR_DEPARTMENT_OTHER): Payer: Medicare Other | Admitting: Physician Assistant

## 2020-03-29 DIAGNOSIS — E11622 Type 2 diabetes mellitus with other skin ulcer: Secondary | ICD-10-CM | POA: Diagnosis not present

## 2020-03-29 DIAGNOSIS — I2510 Atherosclerotic heart disease of native coronary artery without angina pectoris: Secondary | ICD-10-CM | POA: Diagnosis not present

## 2020-03-29 DIAGNOSIS — L97821 Non-pressure chronic ulcer of other part of left lower leg limited to breakdown of skin: Secondary | ICD-10-CM | POA: Diagnosis not present

## 2020-03-29 DIAGNOSIS — E785 Hyperlipidemia, unspecified: Secondary | ICD-10-CM | POA: Diagnosis not present

## 2020-03-29 DIAGNOSIS — I87332 Chronic venous hypertension (idiopathic) with ulcer and inflammation of left lower extremity: Secondary | ICD-10-CM | POA: Diagnosis not present

## 2020-03-29 DIAGNOSIS — J449 Chronic obstructive pulmonary disease, unspecified: Secondary | ICD-10-CM | POA: Diagnosis not present

## 2020-03-29 NOTE — Progress Notes (Signed)
Jenny [name] ([id number]) Visit Report for 03/29/2020 Arrival Information Details Patient Name: Date of Service: Jenny [name], Jenny [name] 03/29/2020 10:15 AM Medical Record Number:[id number] Patient Account Number: [id number] Date of Birth/Sex: Treating RN: 1933/02/28 (86 y.o. Wynelle Link Primary Care Muscab Brenneman: Kirby Funk Other Clinician: Referring Emry Tobin: Treating Yeshaya Vath/Extender:Stone III, Christel Mormon, Vivianne Spence in Treatment: 0 Visit Information History Since Last Visit Added or deleted any medications: No Patient Arrived: Dan Humphreys Any new allergies or adverse reactions: No Arrival Time: 10:11 Had a fall or experienced change in No Accompanied By: self activities of daily living that may affect Transfer Assistance: None risk of falls: Patient Identification Verified: Yes Signs or symptoms of abuse/neglect since last No Secondary Verification Process Yes visito Completed: Hospitalized since last visit: No Patient Requires Transmission- No Implantable device outside of the clinic excluding No Based Precautions: cellular tissue based products placed in the center Patient Has Alerts: Yes since last visit: Patient Alerts: Patient on Blood Has Dressing in Place as Prescribed: Yes Thinner Has Compression in Place as Prescribed: Yes Left ABI: 0.78 Pain Present Now: No Electronic Signature(s) Signed: 03/29/2020 10:53:23 AM By: Shawn Stall Entered By: Shawn Stall on 03/29/2020 10:11:24 -------------------------------------------------------------------------------- Compression Therapy Details Patient Name: Date of Service: Jenny [name] 03/29/2020 10:15 AM Medical Record [id number] Patient Account Number: [id number] Date of Birth/Sex: Treating RN: 08/13/1933 (86 y.o. Wynelle Link Primary Care Decklyn Hyder: Kirby Funk Other Clinician: Referring Johnpaul Gillentine: Treating Lino Wickliff/Extender:Stone III, Christel Mormon, Vivianne Spence in Treatment: 0 Compression Therapy  Performed for Wound Wound #1 Left,Lateral Lower Leg Assessment: Performed By: Clinician Shawn Stall, RN Compression Type: Three Layer Pre Treatment ABI: 0.8 Electronic Signature(s) Signed: 03/29/2020 10:53:23 AM By: Shawn Stall Entered By: Shawn Stall on 03/29/2020 10:14:23 -------------------------------------------------------------------------------- Compression Therapy Details Patient Name: Date of Service: Jenny [name] 03/29/2020 10:15 AM Medical Record [id number] Patient Account Number: [id number] Date of Birth/Sex: Treating RN: November 16, 1933 (86 y.o. Wynelle Link Primary Care Augustine Leverette: Kirby Funk Other Clinician: Referring Biagio Snelson: Treating Nashaly Dorantes/Extender:Stone III, Christel Mormon, Vivianne Spence in Treatment: 0 Compression Therapy Performed for Wound Wound #3 Left,Distal,Posterior Lower Leg Assessment: Performed By: Clinician Shawn Stall, RN Compression Type: Three Layer Pre Treatment ABI: 0.8 Electronic Signature(s) Signed: 03/29/2020 10:53:23 AM By: Shawn Stall Entered By: Shawn Stall on 03/29/2020 10:14:23 -------------------------------------------------------------------------------- Compression Therapy Details Patient Name: Date of Service: Jenny [name] 03/29/2020 10:15 AM Medical Record [id number] Patient Account Number: [id number] Date of Birth/Sex: Treating RN: Jun 18, 1933 (86 y.o. Wynelle Link Primary Care Tymesha Ditmore: Kirby Funk Other Clinician: Referring Blondine Hottel: Treating Jaloni Davoli/Extender:Stone III, Christel Mormon, Vivianne Spence in Treatment: 0 Compression Therapy Performed for Wound Wound #2 Left,Proximal,Posterior Lower Leg Assessment: Performed By: Clinician Shawn Stall, RN Compression Type: Three Layer Pre Treatment ABI: 0.8 Electronic Signature(s) Signed: 03/29/2020 10:53:23 AM By: Shawn Stall Entered By: Shawn Stall on 03/29/2020  10:14:23 -------------------------------------------------------------------------------- Encounter Discharge Information Details Patient Name: Date of Service: Jenny [name] 03/29/2020 10:15 AM Medical Record [id number] Patient Account Number: [id number] Date of Birth/Sex: Treating RN: May 05, 1933 (86 y.o. Wynelle Link Primary Care Jerold Yoss: Kirby Funk Other Clinician: Referring Jahnae Mcadoo: Treating Mauro Arps/Extender:Stone III, Christel Mormon, Vivianne Spence in Treatment: 0 Encounter Discharge Information Items Discharge Condition: Stable Ambulatory Status: Walker Discharge Destination: Home Transportation: Private Auto Accompanied By: self Schedule Follow-up Appointment: Yes Clinical Summary of Care: Electronic Signature(s) Signed: 03/29/2020 10:53:23 AM By: Shawn Stall Entered By: Shawn Stall on 03/29/2020 10:16:00 -------------------------------------------------------------------------------- Patient/Caregiver Education Details Patient Name: Date of Service: Jenny [name] 4/21/2021andnbsp10:15 AM Medical Record Patient Account Number: [id number]  [id number] Number: Treating RN: Levan Hurst Date of Birth/Gender: 1933-04-23 (86 y.o. Other Clinician: F) Treating Worthy Keeler Primary Care Physician: Lavone Orn Physician/Extender: Referring Physician: Lavena Bullion in Treatment: 0 Education Assessment Education Provided To: Patient Education Topics Provided Nutrition: Handouts: Nutrition Methods: Explain/Verbal Responses: Reinforcements needed Electronic Signature(s) Signed: 03/29/2020 10:53:23 AM By: Deon Pilling Entered By: Deon Pilling on 03/29/2020 10:15:49 -------------------------------------------------------------------------------- Wound Assessment Details Patient Name: Date of Service: Jenny [name] 03/29/2020 10:15 AM Medical Record [id number] Patient Account Number: [id number] Date of Birth/Sex: Treating  RN: 1933-02-16 (86 y.o. Nancy Fetter Primary Care Cash Meadow: Lavone Orn Other Clinician: Referring Helaman Mecca: Treating Jolonda Gomm/Extender:Stone III, Jolaine Click, Moses Manners in Treatment: 0 Wound Status Wound Number: 1 Primary Etiology: Venous Leg Ulcer Wound Location: Left, Lateral Lower Leg Wound Status: Open Wounding Event: Gradually Appeared Date Acquired: 02/16/2020 Weeks Of Treatment: 0 Clustered Wound: No Wound Measurements Length: (cm) 0.3 Width: (cm) 0.3 Depth: (cm) 0.1 Area: (cm) 0.071 Volume: (cm) 0.007 Wound Description Classification: Full Thickness Without Exposed Suppo Structures % Reduction in Area: 0% % Reduction in Volume: 0% rt Treatment Notes Wound #1 (Left, Lateral Lower Leg) 1. Cleanse With Wound Cleanser Soap and water 2. Periwound Care TCA Cream 3. Primary Dressing Applied Calcium Alginate Ag 4. Secondary Dressing Dry Gauze 5. Secured With Medipore tape 6. Support Layer Applied 3 layer compression wrap Notes netting. Electronic Signature(s) Signed: 03/29/2020 10:53:23 AM By: Deon Pilling Signed: 03/29/2020 11:18:43 AM By: Levan Hurst RN, BSN Entered By: Deon Pilling on 03/29/2020 10:13:55 -------------------------------------------------------------------------------- Wound Assessment Details Patient Name: Date of Service: Lyman Bishop 03/29/2020 10:15 AM Medical Record [id number] Patient Account Number: [id number] Date of Birth/Sex: Treating RN: 1933/10/31 (86 y.o. Nancy Fetter Primary Care Kyndal Gloster: Lavone Orn Other Clinician: Referring Addison Whidbee: Treating Ambar Raphael/Extender:Stone III, Jolaine Click, Moses Manners in Treatment: 0 Wound Status Wound Number: 2 Primary Etiology: Venous Leg Ulcer Wound Location: Left, Proximal, Posterior Lower Wound Status: Open Leg Wounding Event: Gradually Appeared Date Acquired: 02/16/2020 Weeks Of Treatment: 0 Clustered Wound: No Wound Measurements Length: (cm)  0.5 Width: (cm) 0.5 Depth: (cm) 0.1 Area: (cm) 0.196 Volume: (cm) 0.02 Wound Description Classification: Full Thickness Without Exposed Suppo Structures % Reduction in Area: 0% % Reduction in Volume: 0% rt Treatment Notes Wound #2 (Left, Proximal, Posterior Lower Leg) 1. Cleanse With Wound Cleanser Soap and water 2. Periwound Care TCA Cream 3. Primary Dressing Applied Calcium Alginate Ag 4. Secondary Dressing Dry Gauze 5. Secured With Medipore tape 6. Support Layer Applied 3 layer compression wrap Notes netting. Electronic Signature(s) Signed: 03/29/2020 10:53:23 AM By: Deon Pilling Signed: 03/29/2020 11:18:43 AM By: Levan Hurst RN, BSN Entered By: Deon Pilling on 03/29/2020 10:13:55 -------------------------------------------------------------------------------- Wound Assessment Details Patient Name: Date of Service: Lyman Bishop 03/29/2020 10:15 AM Medical Record [id number] Patient Account Number: [id number] Date of Birth/Sex: Treating RN: February 07, 1933 (86 y.o. Nancy Fetter Primary Care Kellye Mizner: Lavone Orn Other Clinician: Referring Roshaunda Starkey: Treating Charlii Yost/Extender:Stone III, Jolaine Click, Moses Manners in Treatment: 0 Wound Status Wound Number: 3 Primary Etiology: Venous Leg Ulcer Wound Location: Left, Distal, Posterior Lower Leg Wound Status: Open Wounding Event: Gradually Appeared Date Acquired: 02/16/2020 Weeks Of Treatment: 0 Clustered Wound: No Wound Measurements Length: (cm) 0.7 Width: (cm) 0.5 Depth: (cm) 0.1 Area: (cm) 0.275 Volume: (cm) 0.027 Wound Description Full Thickness Without Exposed Suppo Classification: Structures % Reduction in Area: 0% % Reduction in Volume: 0% rt Treatment Notes Wound #3 (Left, Distal, Posterior Lower Leg) 1. Cleanse With Wound Cleanser Soap and water  2. Periwound Care TCA Cream 3. Primary Dressing Applied Calcium Alginate Ag 4. Secondary Dressing Dry Gauze 5. Secured  With Medipore tape 6. Support Layer Applied 3 layer compression wrap Notes netting. Electronic Signature(s) Signed: 03/29/2020 10:53:23 AM By: Shawn Stall Signed: 03/29/2020 11:18:43 AM By: Zandra Abts RN, BSN Entered By: Shawn Stall on 03/29/2020 10:13:55 -------------------------------------------------------------------------------- Vitals Details Patient Name: Date of Service: Jenny [name] 03/29/2020 10:15 AM Medical Record [id number] Patient Account Number: [id number] Date of Birth/Sex: Treating RN: 1933/09/27 (86 y.o. Wynelle Link Primary Care Jeff Frieden: Kirby Funk Other Clinician: Referring Socorro Kanitz: Treating Zakariya Knickerbocker/Extender:Stone III, Christel Mormon, Vivianne Spence in Treatment: 0 Vital Signs Time Taken: 10:11 Temperature (F): 98.5 Height (in): 62 Pulse (bpm): 63 Weight (lbs): 213 Respiratory Rate (breaths/min): 20 Body Mass Index (BMI): 39 Blood Pressure (mmHg): 150/60 Reference Range: 80 - 120 mg / dl Electronic Signature(s) Signed: 03/29/2020 10:53:23 AM By: Shawn Stall Entered By: Shawn Stall on 03/29/2020 10:13:29

## 2020-04-04 ENCOUNTER — Encounter (HOSPITAL_BASED_OUTPATIENT_CLINIC_OR_DEPARTMENT_OTHER): Payer: Medicare Other | Admitting: Internal Medicine

## 2020-04-06 ENCOUNTER — Encounter (HOSPITAL_BASED_OUTPATIENT_CLINIC_OR_DEPARTMENT_OTHER): Payer: Medicare Other | Admitting: Internal Medicine

## 2020-04-06 DIAGNOSIS — I2510 Atherosclerotic heart disease of native coronary artery without angina pectoris: Secondary | ICD-10-CM | POA: Diagnosis not present

## 2020-04-06 DIAGNOSIS — L97821 Non-pressure chronic ulcer of other part of left lower leg limited to breakdown of skin: Secondary | ICD-10-CM | POA: Diagnosis not present

## 2020-04-06 DIAGNOSIS — I890 Lymphedema, not elsewhere classified: Secondary | ICD-10-CM | POA: Diagnosis not present

## 2020-04-06 DIAGNOSIS — E11622 Type 2 diabetes mellitus with other skin ulcer: Secondary | ICD-10-CM | POA: Diagnosis not present

## 2020-04-06 DIAGNOSIS — E785 Hyperlipidemia, unspecified: Secondary | ICD-10-CM | POA: Diagnosis not present

## 2020-04-06 DIAGNOSIS — J449 Chronic obstructive pulmonary disease, unspecified: Secondary | ICD-10-CM | POA: Diagnosis not present

## 2020-04-06 DIAGNOSIS — I87332 Chronic venous hypertension (idiopathic) with ulcer and inflammation of left lower extremity: Secondary | ICD-10-CM | POA: Diagnosis not present

## 2020-04-06 NOTE — Progress Notes (Signed)
Jenny [name] ([id number]) Visit Report for 04/06/2020 HPI Details Patient Name: Date of Service: [facility] Jenny [name] 04/06/2020 10:15 A M Medical Record Number: [id number] Patient Account Number: [id number] Date of Birth/Sex: Treating RN: Jun 20, 1933 (86 y.o. Arta [name] Primary Care Provider: Kirby [name] Other Clinician: Referring Provider: Treating Provider/Extender: Jenny [name] in Treatment: 2 History of Present Illness HPI Description: ADMISSION 03/23/2020 This is an 86 year old woman who is independent. Roughly 5 to 6 weeks ago she noted skin breakdown on her lower legs. She tells me that she has had a long problem with dry itchy skin on her lower legs and some raised hyperkeratotic areas for as long as 4 years. She has tried various skin creams trying to get her skin to become less dry and stop itching. She has not been dressing her wounds with anything specifically. Past medical history includes COPD, coronary artery disease status post stent, continued tobacco abuse, hypertension, hyperlipidemia, type 2 diabetes mellitus and a duodenal ulcer ABIs in our clinic were 0.78 on 4/29; patient was in last week for a nurse visit for dressing change. Her wounds are all closed. She has severe bilateral chronic venous insufficiency with stasis dermatitis/venous inflammation. I think she also has secondary lymphedema Electronic Signature(s) Signed: 04/06/2020 5:41:26 PM By: Jenny Najjar MD Entered By: Jenny [name] on 04/06/2020 10:43:57 -------------------------------------------------------------------------------- Physical Exam Details Patient Name: Date of Service: Jenny [name], [name] 04/06/2020 10:15 A M Medical Record Number: [id number] Patient Account Number: [id number] Date of Birth/Sex: Treating RN: Jenny [name] Primary Care Provider: Kirby [name] Other Clinician: Referring Provider: Treating Provider/Extender: Jenny [name] in Treatment: 2 Constitutional Patient is hypertensive.. Pulse regular and within target range for patient.Marland Kitchen Respirations regular, non-labored and within target range.. Temperature is normal and within the target range for the patient.Marland Kitchen Appears in no distress. Cardiovascular Pedal pulses are palpable. . Integumentary (Hair, Skin) Dermal fibrosis in the distal lower extremities. Chronic venous changes stasis dermatitis. Notes Wound exam; area questions on the left anterior lateral and posterior lower extremities both of these are healed. Her skin looks improved in terms of hydration however she still has significant erythema which is nontender. I think this is chronic venous insufficiency, probably some degree of lymphedema Electronic Signature(s) Signed: 04/06/2020 5:41:26 PM By: Jenny Najjar MD Entered By: Jenny [name] on 04/06/2020 10:45:18 -------------------------------------------------------------------------------- Physician Orders Details Patient Name: Date of Service: [name], [name] 04/06/2020 10:15 A M Medical Record Number: [id number] Patient Account Number: [id number] Date of Birth/Sex: Treating RN: 1933/02/19 (86 y.o. Jenny [name] Primary Care Provider: Kirby [name] Other Clinician: Referring Provider: Treating Provider/Extender: Jenny [name] in Treatment: 2 Verbal / Phone Orders: No Diagnosis Coding ICD-10 Coding Code Description [phone] Non-pressure chronic ulcer of other part of left lower leg limited to breakdown of skin I87.332 Chronic venous hypertension (idiopathic) with ulcer and inflammation of left lower extremity I89.0 Lymphedema, not elsewhere classified Discharge From [name] - call if any future wound care needs. Skin Barriers/Peri-Wound Care Moisturizing lotion - both leg every night. Edema Control Avoid standing for long periods of time Elevate  legs to the level of the heart or above for 30 minutes daily and/or when sitting, a frequency of: - throughout the day. Exercise regularly Support Garment 20-30 mm/Hg pressure to: - patient to purchase stockings from elastic therapy. apply in the morning and remove at night. Staff to provide measurements and elastic therapy  number. Electronic Signature(s) Signed: 04/06/2020 5:33:37 PM By: Jenny [name] Signed: 04/06/2020 5:41:26 PM By: Jenny Najjar MD Entered By: Jenny [name] on 04/06/2020 10:34:20 -------------------------------------------------------------------------------- Problem List Details Patient Name: Date of Service: Jenny [name], Jenny [name], [name] 04/06/2020 10:15 A M Medical Record Number: [id number] Patient Account Number: [id number] Date of Birth/Sex: Treating RN: 03-22-1933 (86 y.o. Jenny [name] Primary Care Provider: Kirby [name] Other Clinician: Referring Provider: Treating Provider/Extender: Jenny [name] in Treatment: 2 Active Problems ICD-10 Encounter Code Description Active Date MDM Diagnosis [phone] Non-pressure chronic ulcer of other part of left lower leg limited to breakdown 03/23/2020 No Yes of skin I87.332 Chronic venous hypertension (idiopathic) with ulcer and inflammation of left 03/23/2020 No Yes lower extremity I89.0 Lymphedema, not elsewhere classified 03/23/2020 No Yes Inactive Problems Resolved Problems Electronic Signature(s) Signed: 04/06/2020 5:41:26 PM By: Jenny Najjar MD Entered By: Jenny [name] on 04/06/2020 10:43:08 -------------------------------------------------------------------------------- Progress Note Details Patient Name: Date of Service: Jenny [name], [name] 04/06/2020 10:15 A M Medical Record Number: [id number] Patient Account Number: [id number] Date of Birth/Sex: Treating RN: Jenny 10, 1934 (86 y.o. Arta [name] Primary Care Provider: Kirby [name] Other Clinician: Referring Provider: Treating  Provider/Extender: Jenny [name] in Treatment: 2 Subjective History of Present Illness (HPI) ADMISSION 03/23/2020 This is an 86 year old woman who is independent. Roughly 5 to 6 weeks ago she noted skin breakdown on her lower legs. She tells me that she has had a long problem with dry itchy skin on her lower legs and some raised hyperkeratotic areas for as long as 4 years. She has tried various skin creams trying to get her skin to become less dry and stop itching. She has not been dressing her wounds with anything specifically. Past medical history includes COPD, coronary artery disease status post stent, continued tobacco abuse, hypertension, hyperlipidemia, type 2 diabetes mellitus and a duodenal ulcer ABIs in our clinic were 0.78 on 4/29; patient was in last week for a nurse visit for dressing change. Her wounds are all closed. She has severe bilateral chronic venous insufficiency with stasis dermatitis/venous inflammation. I think she also has secondary lymphedema Objective Constitutional Patient is hypertensive.. Pulse regular and within target range for patient.Marland Kitchen Respirations regular, non-labored and within target range.. Temperature is normal and within the target range for the patient.Marland Kitchen Appears in no distress. Vitals Time Taken: 10:09 AM, Height: 62 in, Weight: 213 lbs, BMI: 39, Temperature: 98.8 F, Pulse: 58 bpm, Respiratory Rate: 20 breaths/min, Blood Pressure: 178/63 mmHg. Cardiovascular Pedal pulses are palpable. General Notes: Wound exam; area questions on the left anterior lateral and posterior lower extremities both of these are healed. Her skin looks improved in terms of hydration however she still has significant erythema which is nontender. I think this is chronic venous insufficiency, probably some degree of lymphedema Integumentary (Hair, Skin) Dermal fibrosis in the distal lower extremities. Chronic venous changes stasis dermatitis. Wound #1  status is Open. Original cause of wound was Gradually Appeared. The wound is located on the Left,Lateral Lower Leg. The wound measures 0cm length x 0cm width x 0cm depth; 0cm^2 area and 0cm^3 volume. Wound #2 status is Open. Original cause of wound was Gradually Appeared. The wound is located on the Left,Proximal,Posterior Lower Leg. The wound measures 0cm length x 0cm width x 0cm depth; 0cm^2 area and 0cm^3 volume. Wound #3 status is Open. Original cause of wound was Gradually Appeared. The wound is located on the Left,Distal,Posterior Lower Leg. The wound measures 0cm length x 0cm width x  0cm depth; 0cm^2 area and 0cm^3 volume. Assessment Active Problems ICD-10 Non-pressure chronic ulcer of other part of left lower leg limited to breakdown of skin Chronic venous hypertension (idiopathic) with ulcer and inflammation of left lower extremity Lymphedema, not elsewhere classified Plan Discharge From [facility]: Discharge from [facility] - call if any future wound care needs. Skin Barriers/Peri-Wound Care: Moisturizing lotion - both leg every night. Edema Control: Avoid standing for long periods of time Elevate legs to the level of the heart or above for 30 minutes daily and/or when sitting, a frequency of: - throughout the day. Exercise regularly Support Garment 20-30 mm/Hg pressure to: - patient to purchase stockings from elastic therapy. apply in the morning and remove at night. Staff to provide measurements and elastic therapy number. 1. The patient is closed from the wound point of view. She can be discharged 2. We have given her her measurements for 20/30 below-knee stockings from [place] she already has Cetaphil cream at home 3. If she turns over in quick succession we may have to be a little more thorough in what she is using at home Electronic Signature(s) Signed: 04/06/2020 5:41:26 PM By: Linton Ham MD Entered By: Linton Ham on 04/06/2020  10:55:38 -------------------------------------------------------------------------------- SuperBill Details Patient Name: Date of Service: Jenny [name], [name] 04/06/2020 Medical Record Number: [id number] Patient Account Number: [id number] Date of Birth/Sex: Treating RN: 1933-03-20 (86 y.o. Helene Shoe, Tammi Klippel Primary Care Provider: Lavone Orn Other Clinician: Referring Provider: Treating Provider/Extender: Herma Ard, Moses Manners in Treatment: 2 Diagnosis Coding ICD-10 Codes Code Description [phone] Non-pressure chronic ulcer of other part of left lower leg limited to breakdown of skin I87.332 Chronic venous hypertension (idiopathic) with ulcer and inflammation of left lower extremity I89.0 Lymphedema, not elsewhere classified Facility Procedures The patient participates with Medicare or their insurance follows the Medicare Facility Guidelines: CPT4 Code Description Modifier Quantity [id number] [place] VISIT-LEV 4 EST PT 1 Physician Procedures : CPT4 Code Description Modifier [id number] - WC PHYS LEVEL 3 - EST PT ICD-10 Diagnosis Description L97.821 Non-pressure chronic ulcer of other part of left lower leg limited to breakdown of skin I87.332 Chronic venous hypertension (idiopathic) with  ulcer and inflammation of left lower extremity I89.0 Lymphedema, not elsewhere classified Quantity: 1 Electronic Signature(s) Signed: 04/06/2020 5:41:26 PM By: Linton Ham MD Entered By: Linton Ham on 04/06/2020 10:55:57

## 2020-04-11 NOTE — Progress Notes (Signed)
LILLIANNE, EICK ([id number]) Visit Report for 03/29/2020 SuperBill Details Patient Name: Date of Service: [facility] NES, KINDAL PONTI 03/29/2020 Medical Record Number: [id number] Patient Account Number: [id number] Date of Birth/Sex: Treating RN: 07-20-1933 (86 y.o. Wynelle Link Primary Care Provider: Kirby Funk Other Clinician: Referring Provider: Treating Provider/Extender: Marita Snellen, Vivianne Spence in Treatment: 0 Diagnosis Coding ICD-10 Codes Code Description [phone] Non-pressure chronic ulcer of other part of left lower leg limited to breakdown of skin I87.332 Chronic venous hypertension (idiopathic) with ulcer and inflammation of left lower extremity I89.0 Lymphedema, not elsewhere classified Facility Procedures The patient participates with Medicare or their insurance follows the Medicare Facility Guidelines CPT4 Code Description Modifier Quantity [id number] (Facility Use Only) [phone] - APPLY MULTLAY COMPRS LWR LT LEG 1 Electronic Signature(s) Signed: 03/29/2020 10:53:23 AM By: Shawn Stall Signed: 04/11/2020 6:06:55 PM By: Lenda Kelp PA-C Entered By: Shawn Stall on 03/29/2020 10:16:07

## 2020-04-21 DIAGNOSIS — Z23 Encounter for immunization: Secondary | ICD-10-CM | POA: Diagnosis not present

## 2020-05-01 NOTE — Progress Notes (Signed)
MAHAILA, TISCHER ([id number]) Visit Report for 04/06/2020 Arrival Information Details Patient Name: Date of Service: [facility] NES, SHALUNDA LINDH 04/06/2020 10:15 A M Medical Record Number: [id number] Patient Account Number: [id number] Date of Birth/Sex: Treating RN: [birth year] (86 y.o. Helene Shoe, Tammi Klippel Primary Care Provider: Lavone Orn Other Clinician: Referring Provider: Treating Provider/Extender: Herma Ard, Moses Manners in Treatment: 2 Visit Information History Since Last Visit Added or deleted any medications: No Patient Arrived: Gilford Rile Any new allergies or adverse reactions: No Arrival Time: 10:09 Had a fall or experienced change in No Accompanied By: self activities of daily living that may affect Transfer Assistance: None risk of falls: Patient Identification Verified: Yes Signs or symptoms of abuse/neglect since last visito No Secondary Verification Process Completed: Yes Hospitalized since last visit: No Patient Requires Transmission-Based Precautions: No Implantable device outside of the clinic excluding No Patient Has Alerts: Yes cellular tissue based products placed in the center Patient Alerts: Patient on Blood Thinner since last visit: Left ABI: 0.78 Has Dressing in Place as Prescribed: Yes Pain Present Now: No Electronic Signature(s) Signed: 04/19/2020 9:09:16 AM By: Sandre Kitty Entered By: Sandre Kitty on 04/06/2020 10:09:51 -------------------------------------------------------------------------------- Clinic Level of Care Assessment Details Patient Name: Date of Service: [facility] NES, ALLISON DESHOTELS 04/06/2020 10:15 A M Medical Record Number: [id number] Patient Account Number: [id number] Date of Birth/Sex: Treating RN: 04-24-1933 (86 y.o. Helene Shoe, Tammi Klippel Primary Care Provider: Lavone Orn Other Clinician: Referring Provider: Treating Provider/Extender: Louis Meckel in Treatment: 2 Clinic Level of Care Assessment Items TOOL 4  Quantity Score X- 1 0 Use when only an EandM is performed on FOLLOW-UP visit ASSESSMENTS - Nursing Assessment / Reassessment X- 1 10 Reassessment of Co-morbidities (includes updates in patient status) X- 1 5 Reassessment of Adherence to Treatment Plan ASSESSMENTS - Wound and Skin A ssessment / Reassessment []  - 0 Simple Wound Assessment / Reassessment - one wound X- 3 5 Complex Wound Assessment / Reassessment - multiple wounds X- 1 10 Dermatologic / Skin Assessment (not related to wound area) ASSESSMENTS - Focused Assessment X- 1 5 Circumferential Edema Measurements - multi extremities X- 1 10 Nutritional Assessment / Counseling / Intervention []  - 0 Lower Extremity Assessment (monofilament, tuning fork, pulses) []  - 0 Peripheral Arterial Disease Assessment (using hand held doppler) ASSESSMENTS - Ostomy and/or Continence Assessment and Care []  - 0 Incontinence Assessment and Management []  - 0 Ostomy Care Assessment and Management (repouching, etc.) PROCESS - Coordination of Care []  - 0 Simple Patient / Family Education for ongoing care X- 1 20 Complex (extensive) Patient / Family Education for ongoing care X- 1 10 Staff obtains Programmer, systems, Records, T Results / Process Orders est []  - 0 Staff telephones HHA, Nursing Homes / Clarify orders / etc []  - 0 Routine Transfer to another Facility (non-emergent condition) []  - 0 Routine Hospital Admission (non-emergent condition) []  - 0 New Admissions / Biomedical engineer / Ordering NPWT Apligraf, etc. , []  - 0 Emergency Hospital Admission (emergent condition) []  - 0 Simple Discharge Coordination X- 1 15 Complex (extensive) Discharge Coordination PROCESS - Special Needs []  - 0 Pediatric / Minor Patient Management []  - 0 Isolation Patient Management []  - 0 Hearing / Language / Visual special needs []  - 0 Assessment of Community assistance (transportation, D/C planning, etc.) []  - 0 Additional assistance / Altered  mentation []  - 0 Support Surface(s) Assessment (bed, cushion, seat, etc.) INTERVENTIONS - Wound Cleansing / Measurement []  - 0 Simple Wound Cleansing - one wound X- 3 5  Complex Wound Cleansing - multiple wounds X- 1 5 Wound Imaging (photographs - any number of wounds) []  - 0 Wound Tracing (instead of photographs) []  - 0 Simple Wound Measurement - one wound X- 3 5 Complex Wound Measurement - multiple wounds INTERVENTIONS - Wound Dressings []  - 0 Small Wound Dressing one or multiple wounds []  - 0 Medium Wound Dressing one or multiple wounds []  - 0 Large Wound Dressing one or multiple wounds []  - 0 Application of Medications - topical []  - 0 Application of Medications - injection INTERVENTIONS - Miscellaneous []  - 0 External ear exam []  - 0 Specimen Collection (cultures, biopsies, blood, body fluids, etc.) []  - 0 Specimen(s) / Culture(s) sent or taken to Lab for analysis []  - 0 Patient Transfer (multiple staff / / Similar devices) []  - 0 Simple Staple / Suture removal (25 or less) []  - 0 Complex Staple / Suture removal (26 or more) []  - 0 Hypo / Hyperglycemic Management (close monitor of Blood Glucose) []  - 0 Ankle / Brachial Index (ABI) - do not check if billed separately X- 1 5 Vital Signs Has the patient been seen at the hospital within the last three years: Yes Total Score: 140 Level Of Care: New/Established - Level 4 Electronic Signature(s) Signed: 04/06/2020 5:33:37 PM By: Entered By: on 04/06/2020 10:44:59 -------------------------------------------------------------------------------- Encounter Discharge Information Details Patient Name: Date of Service: [name], Gwendalyn J. 04/06/2020 10:15 A M Medical Record Number: Patient Account Number: Date of Birth/Sex: Treating RN: 11-Jul-1933 (86 y.o. Primary Care Provider: Nurse, adult Other Clinician: Referring Provider: Treating  Provider/Extender: in Treatment: 2 Encounter Discharge Information Items Discharge Condition: Stable Ambulatory Status: Walker Discharge Destination: Home Transportation: Private Auto Accompanied By: self Schedule Follow-up Appointment: No Clinical Summary of Care: Notes educated patient on stockings, leg measurements, and stockings company number provided to patient. Electronic Signature(s) Signed: 04/06/2020 5:33:37 PM By: Entered By: on 04/06/2020 10:44:26 -------------------------------------------------------------------------------- Lower Extremity Assessment Details Patient Name: Date of Service: Shawn Stall NES, NEFTALI THUROW 04/06/2020 10:15 A M Medical Record Number: 04/08/2020 Patient Account Number: [id number] Date of Birth/Sex: Treating RN: [birth year] (86 y.o. 97 Primary Care Provider: [name] Other Clinician: Referring Provider: Treating Provider/Extender: Kirby Funk, Dianna Limbo in Treatment: 2 Edema Assessment Assessed: [Left: No] [Right: No] Edema: [Left: Ye] [Right: s] Calf Left: Right: Point of Measurement: 35 cm From Medial Instep 37 cm cm Ankle Left: Right: Point of Measurement: 16 cm From Medial Instep 21 cm cm Vascular Assessment Pulses: Dorsalis Pedis Palpable: [Left:Yes] Electronic Signature(s) Signed: 04/06/2020 5:19:50 PM By: Shawn Stall Entered By: Shawn Stall on 04/06/2020 10:26:04 -------------------------------------------------------------------------------- Multi Wound Chart Details Patient Name: Date of Service: [name], Leara J. 04/06/2020 10:15 A M Medical Record Number: Ronnette Hila Patient Account Number: 04/08/2020 Date of Birth/Sex: Treating RN: July 15, 1933 (86 y.o. 09/06/1933, 89 Primary Care Provider: Harvest Dark Other Clinician: Referring Provider: Treating Provider/Extender: Kirby Funk, Harle Battiest in Treatment:  2 Vital Signs Height(in): 62 Pulse(bpm): 58 Weight(lbs): 213 Blood Pressure(mmHg): 178/63 Body Mass Index(BMI): 39 Temperature(F): 98.8 Respiratory Rate(breaths/min): 20 Photos: [1:No Photos Left, Lateral Lower Leg] [2:No Photos Left, Proximal, Posterior Lower Leg] [3:No Photos Left, Distal, Posterior Lower Leg] Wound Location: [1:Gradually Appeared] [2:Gradually Appeared] [3:Gradually Appeared] Wounding Event: [1:Venous Leg Ulcer] [2:Venous Leg Ulcer] [3:Venous Leg Ulcer] Primary Etiology: [1:02/16/2020] [2:02/16/2020] [3:02/16/2020] Date Acquired: [1:2] [2:2] [3:2] Weeks of Treatment: [1:Open] [2:Open] [3:Open] Wound  Status: [1:0x0x0] [2:0x0x0] [3:0x0x0] Measurements L x W x D (cm) [1:0] [2:0] [3:0] A (cm) : rea [1:0] [2:0] [3:0] Volume (cm) : [1:100.00%] [2:100.00%] [3:100.00%] % Reduction in A rea: [1:100.00%] [2:100.00%] [3:100.00%] % Reduction in Volume: [1:Full Thickness Without Exposed] [2:Full Thickness Without Exposed] [3:Full Thickness Without Exposed] Classification: [1:Support Structures] [2:Support Structures] [3:Support Structures] Treatment Notes Electronic Signature(s) Signed: 04/06/2020 5:41:26 PM By: Baltazar Najjar MD Signed: 05/01/2020 1:30:32 PM By: Shawn Stall Entered By: Baltazar Najjar on 04/06/2020 10:43:16 -------------------------------------------------------------------------------- Multi-Disciplinary Care Plan Details Patient Name: Date of Service: [facility] NES, Mikaiah J. 04/06/2020 10:15 A M Medical Record Number: [id number] Patient Account Number: [id number] Date of Birth/Sex: Treating RN: Nov 04, 1933 (86 y.o. [name] Primary Care Provider: Kirby Funk Other Clinician: Referring Provider: Treating Provider/Extender: Harle Battiest, Vivianne Spence in Treatment: 2 Active Inactive Electronic Signature(s) Signed: 04/06/2020 5:33:37 PM By: Shawn Stall Signed: 05/01/2020 1:30:32 PM By: Shawn Stall Entered By: Shawn Stall on  04/06/2020 10:45:32 -------------------------------------------------------------------------------- Pain Assessment Details Patient Name: Date of Service: [facility] NES, Kaytlynn J. 04/06/2020 10:15 A M Medical Record Number: [id number] Patient Account Number: [id number] Date of Birth/Sex: Treating RN: [birth year] (86 y.o. Debara Pickett, Yvonne Kendall Primary Care Provider: Kirby Funk Other Clinician: Referring Provider: Treating Provider/Extender: Harle Battiest, Vivianne Spence in Treatment: 2 Active Problems Location of Pain Severity and Description of Pain Patient Has Paino No Site Locations Pain Management and Medication Current Pain Management: Electronic Signature(s) Signed: 04/19/2020 9:09:16 AM By: Karl Ito Signed: 05/01/2020 1:30:32 PM By: Shawn Stall Entered By: Karl Ito on 04/06/2020 10:12:16 -------------------------------------------------------------------------------- Patient/Caregiver Education Details Patient Name: Date of Service: [facility] NES, Ronnette Hila 4/29/2021andnbsp10:15 A M Medical Record Number: [id number] Patient Account Number: [id number] Date of Birth/Gender: Treating RN: 03-24-1933 (86 y.o. [name] Primary Care Physician: Kirby Funk Other Clinician: Referring Physician: Treating Physician/Extender: Dianna Limbo in Treatment: 2 Education Assessment Education Provided To: Patient Education Topics Provided Safety: Handouts: Safety Methods: Explain/Verbal Responses: Reinforcements needed Electronic Signature(s) Signed: 04/06/2020 5:33:37 PM By: Shawn Stall Entered By: Shawn Stall on 04/06/2020 10:28:20 -------------------------------------------------------------------------------- Wound Assessment Details Patient Name: Date of Service: [facility] NES, KATTALEYA ALIA 04/06/2020 10:15 A M Medical Record Number: [id number] Patient Account Number: [id number] Date of Birth/Sex: Treating RN: 05/23/1933 (86 y.o. Debara Pickett,  Millard.Loa Primary Care Provider: Kirby Funk Other Clinician: Referring Provider: Treating Provider/Extender: Harle Battiest, Vivianne Spence in Treatment: 2 Wound Status Wound Number: 1 Primary Etiology: Venous Leg Ulcer Wound Location: Left, Lateral Lower Leg Wound Status: Open Wounding Event: Gradually Appeared Date Acquired: 02/16/2020 Weeks Of Treatment: 2 Clustered Wound: No Photos Photo Uploaded By: Benjaman Kindler on 04/07/2020 14:30:02 Wound Measurements Length: (cm) Width: (cm) Depth: (cm) Area: (cm) Volume: (cm) 0 % Reduction in Area: 100% 0 % Reduction in Volume: 100% 0 0 0 Wound Description Classification: Full Thickness Without Exposed Support Structur es Electronic Signature(s) Signed: 04/19/2020 9:09:16 AM By: Karl Ito Signed: 05/01/2020 1:30:32 PM By: Shawn Stall Entered By: Karl Ito on 04/06/2020 10:20:09 -------------------------------------------------------------------------------- Wound Assessment Details Patient Name: Date of Service: [name], Tzipporah J. 04/06/2020 10:15 A M Medical Record Number: [id number] Patient Account Number: [id number] Date of Birth/Sex: Treating RN: July 21, 1933 (86 y.o. [name] Primary Care Provider: Kirby Funk Other Clinician: Referring Provider: Treating Provider/Extender: Dianna Limbo in Treatment: 2 Wound Status Wound Number: 2 Primary Etiology: Venous Leg Ulcer Wound Location: Left, Proximal, Posterior Lower Leg Wound Status: Open Wounding Event: Gradually Appeared Date Acquired: 02/16/2020 Weeks Of Treatment: 2 Clustered Wound:  No Photos Photo Uploaded By: Benjaman Kindler on 04/07/2020 14:30:17 Wound Measurements Length: (cm) Width: (cm) Depth: (cm) Area: (cm) Volume: (cm) 0 % Reduction in Area: 100% 0 % Reduction in Volume: 100% 0 0 0 Wound Description Classification: Full Thickness Without Exposed Support Structur es Electronic  Signature(s) Signed: 04/19/2020 9:09:16 AM By: Karl Ito Signed: 05/01/2020 1:30:32 PM By: Shawn Stall Entered By: Karl Ito on 04/06/2020 10:20:09 -------------------------------------------------------------------------------- Wound Assessment Details Patient Name: Date of Service: [name], Louna J. 04/06/2020 10:15 A M Medical Record Number: [id number] Patient Account Number: [id number] Date of Birth/Sex: Treating RN: 04-06-1933 (86 y.o. Debara Pickett, Millard.Loa Primary Care Provider: Kirby Funk Other Clinician: Referring Provider: Treating Provider/Extender: Harle Battiest, Vivianne Spence in Treatment: 2 Wound Status Wound Number: 3 Primary Etiology: Venous Leg Ulcer Wound Location: Left, Distal, Posterior Lower Leg Wound Status: Open Wounding Event: Gradually Appeared Date Acquired: 02/16/2020 Weeks Of Treatment: 2 Clustered Wound: No Photos Photo Uploaded By: Benjaman Kindler on 04/07/2020 14:30:35 Wound Measurements Length: (cm) Width: (cm) Depth: (cm) Area: (cm) Volume: (cm) 0 % Reduction in Area: 100% 0 % Reduction in Volume: 100% 0 0 0 Wound Description Classification: Full Thickness Without Exposed Support Structur es Electronic Signature(s) Signed: 04/19/2020 9:09:16 AM By: Karl Ito Signed: 05/01/2020 1:30:32 PM By: Shawn Stall Entered By: Karl Ito on 04/06/2020 10:20:09 -------------------------------------------------------------------------------- Vitals Details Patient Name: Date of Service: [name], Chevie J. 04/06/2020 10:15 A M Medical Record Number: [id number] Patient Account Number: [id number] Date of Birth/Sex: Treating RN: Jan 18, 1933 (86 y.o. Debara Pickett, Yvonne Kendall Primary Care Provider: Kirby Funk Other Clinician: Referring Provider: Treating Provider/Extender: Harle Battiest, Vivianne Spence in Treatment: 2 Vital Signs Time Taken: 10:09 Temperature (F): 98.8 Height (in): 62 Pulse (bpm): 58 Weight  (lbs): 213 Respiratory Rate (breaths/min): 20 Body Mass Index (BMI): 39 Blood Pressure (mmHg): 178/63 Reference Range: 80 - 120 mg / dl Electronic Signature(s) Signed: 04/19/2020 9:09:16 AM By: Karl Ito Entered By: Karl Ito on 04/06/2020 10:12:12

## 2020-05-01 NOTE — Progress Notes (Signed)
Jenny [name], Jenny [name] ([id number]) Visit Report for 03/23/2020 Allergy List Details Patient Name: Date of Service: Jenny [name], TRENISE TURAY 03/23/2020 2:45 PM Medical Record Number: [id number] Patient Account Number: [id number] Date of Birth/Sex: Treating RN: 1933-05-22 (86 y.o. Harvest Dark Primary Care Kamela Blansett: Kirby Funk Other Clinician: Referring Lynnett Langlinais: Treating [employer]/Extender: Harle Battiest, Vivianne Spence in Treatment: 0 Allergies Active Allergies Demerol Flexeril Darvon atorvastatin calcium adhesive tape Allergy Notes Electronic Signature(s) Signed: 03/24/2020 4:21:34 PM By: Cherylin Mylar Entered By: Cherylin Mylar on 03/23/2020 15:08:17 -------------------------------------------------------------------------------- Arrival Information Details Patient Name: Date of Service: Jenny [name], [name] 03/23/2020 2:45 PM Medical Record Number: [id number] Patient Account Number: [id number] Date of Birth/Sex: Treating RN: [birth year] (86 y.o. Harvest Dark Primary Care Fred Franzen: Kirby Funk Other Clinician: Referring Korvin Valentine: Treating Kasumi Ditullio/Extender: Dianna Limbo in Treatment: 0 Visit Information Patient Arrived: Cloyde Reams Time: 15:02 Accompanied By: self Transfer Assistance: None Patient Identification Verified: Yes Secondary Verification Process Completed: Yes Patient Has Alerts: Yes Patient Alerts: Patient on Blood Thinner Left ABI: 0.78 Electronic Signature(s) Signed: 03/24/2020 4:21:34 PM By: Cherylin Mylar Entered By: Cherylin Mylar on 03/23/2020 15:53:14 -------------------------------------------------------------------------------- Clinic Level of Care Assessment Details Patient Name: Date of Service: Jenny [name] 03/23/2020 2:45 PM Medical Record Number: [id number] Patient Account Number: [id number] Date of Birth/Sex: Treating RN: Jan 27, 1933 (86 y.o. Debara Pickett, Yvonne Kendall Primary Care Alaster Asfaw: Kirby Funk  Other Clinician: Referring Rose Hegner: Treating Natalea Sutliff/Extender: Harle Battiest, Vivianne Spence in Treatment: 0 Clinic Level of Care Assessment Items TOOL 4 Quantity Score X- 1 0 Use when only an EandM is performed on FOLLOW-UP visit ASSESSMENTS - Nursing Assessment / Reassessment X- 1 10 Reassessment of Co-morbidities (includes updates in patient status) X- 1 5 Reassessment of Adherence to Treatment Plan ASSESSMENTS - Wound and Skin A ssessment / Reassessment []  - 0 Simple Wound Assessment / Reassessment - one wound X- 3 5 Complex Wound Assessment / Reassessment - multiple wounds X- 1 10 Dermatologic / Skin Assessment (not related to wound area) ASSESSMENTS - Focused Assessment X- 1 5 Circumferential Edema Measurements - multi extremities X- 1 10 Nutritional Assessment / Counseling / Intervention []  - 0 Lower Extremity Assessment (monofilament, tuning fork, pulses) []  - 0 Peripheral Arterial Disease Assessment (using hand held doppler) ASSESSMENTS - Ostomy and/or Continence Assessment and Care []  - 0 Incontinence Assessment and Management []  - 0 Ostomy Care Assessment and Management (repouching, etc.) PROCESS - Coordination of Care []  - 0 Simple Patient / Family Education for ongoing care X- 1 20 Complex (extensive) Patient / Family Education for ongoing care X- 1 10 Staff obtains , Records, T Results / Process Orders est X- 1 10 Staff telephones HHA, Nursing Homes / Clarify orders / etc []  - 0 Routine Transfer to another Facility (non-emergent condition) []  - 0 Routine Hospital Admission (non-emergent condition) []  - 0 New Admissions / / Ordering NPWT Apligraf, etc. , []  - 0 Emergency Hospital Admission (emergent condition) []  - 0 Simple Discharge Coordination X- 1 15 Complex (extensive) Discharge Coordination PROCESS - Special Needs []  - 0 Pediatric / Minor Patient Management []  - 0 Isolation Patient Management []   - 0 Hearing / Language / Visual special needs []  - 0 Assessment of Community assistance (transportation, D/C planning, etc.) []  - 0 Additional assistance / Altered mentation []  - 0 Support Surface(s) Assessment (bed, cushion, seat, etc.) INTERVENTIONS - Wound Cleansing / Measurement []  - 0 Simple Wound Cleansing - one wound X- 3 5 Complex Wound Cleansing -  multiple wounds X- 1 5 Wound Imaging (photographs - any number of wounds) []  - 0 Wound Tracing (instead of photographs) []  - 0 Simple Wound Measurement - one wound X- 3 5 Complex Wound Measurement - multiple wounds INTERVENTIONS - Wound Dressings []  - 0 Small Wound Dressing one or multiple wounds []  - 0 Medium Wound Dressing one or multiple wounds X- 1 20 Large Wound Dressing one or multiple wounds []  - 0 Application of Medications - topical []  - 0 Application of Medications - injection INTERVENTIONS - Miscellaneous []  - 0 External ear exam []  - 0 Specimen Collection (cultures, biopsies, blood, body fluids, etc.) []  - 0 Specimen(s) / Culture(s) sent or taken to Lab for analysis []  - 0 Patient Transfer (multiple staff / / Similar devices) []  - 0 Simple Staple / Suture removal (25 or less) []  - 0 Complex Staple / Suture removal (26 or more) []  - 0 Hypo / Hyperglycemic Management (close monitor of Blood Glucose) []  - 0 Ankle / Brachial Index (ABI) - do not check if billed separately X- 1 5 Vital Signs Has the patient been seen at the hospital within the last three years: Yes Total Score: 170 Level Of Care: New/Established - Level 5 Electronic Signature(s) Signed: 03/23/2020 4:13:32 PM By: Entered By: on 03/23/2020 15:53:08 -------------------------------------------------------------------------------- Compression Therapy Details Patient Name: Date of Service: [name], Jenny [name] 03/23/2020 2:45 PM Medical Record Number: Patient Account Number: Date of  Birth/Sex: Treating RN: Sep 23, 1933 (86 y.o. Primary Care Stefon Ramthun: Other Clinician: Referring Aedyn Mckeon: Treating Cera Rorke/Extender: in Treatment: 0 Compression Therapy Performed for Wound Assessment: Wound #1 Left,Lateral Lower Leg Performed By: Clinician 03/25/2020, RN Compression Type: Three Layer Pre Treatment ABI: 0.8 Post Procedure Diagnosis Same as Pre-procedure Electronic Signature(s) Signed: 03/23/2020 4:13:32 PM By: Shawn Stall Entered By: 03/25/2020 on 03/23/2020 15:52:31 -------------------------------------------------------------------------------- Compression Therapy Details Patient Name: Date of Service: Ronnette Hila [name], OTHELLA SLAPPEY 03/23/2020 2:45 PM Medical Record Number: [id number] Patient Account Number: 09/06/1933 Date of Birth/Sex: Treating RN: 1933-10-25 (86 y.o. Kirby Funk Primary Care Polina Burmaster: Dianna Limbo Other Clinician: Referring Earnesteen Birnie: Treating Rosela Supak/Extender: Zenaida Deed in Treatment: 0 Compression Therapy Performed for Wound Assessment: Wound #3 Left,Distal,Posterior Lower Leg Performed By: Clinician 03/25/2020, RN Compression Type: Three Layer Pre Treatment ABI: 0.8 Post Procedure Diagnosis Same as Pre-procedure Electronic Signature(s) Signed: 03/23/2020 4:13:32 PM By: Shawn Stall Entered By: 03/25/2020 on 03/23/2020 15:52:31 -------------------------------------------------------------------------------- Compression Therapy Details Patient Name: Date of Service: Ronnette Hila [name], DEENA SHAUB 03/23/2020 2:45 PM Medical Record Number: [id number] Patient Account Number: 09/06/1933 Date of Birth/Sex: Treating RN: May 21, 1933 (86 y.o. Kirby Funk Primary Care Lew Prout: Dianna Limbo Other Clinician: Referring Duward Allbritton: Treating Teshia Mahone/Extender: Zenaida Deed in Treatment: 0 Compression Therapy Performed for Wound  Assessment: Wound #2 Left,Proximal,Posterior Lower Leg Performed By: Clinician 03/25/2020, RN Compression Type: Three Layer Pre Treatment ABI: 0.8 Post Procedure Diagnosis Same as Pre-procedure Electronic Signature(s) Signed: 03/23/2020 4:13:32 PM By: Shawn Stall Entered By: 03/25/2020 on 03/23/2020 15:52:32 -------------------------------------------------------------------------------- Lower Extremity Assessment Details Patient Name: Date of Service: Ronnette Hila [name], VIRDIA ZIESMER 03/23/2020 2:45 PM Medical Record Number: [id number] Patient Account Number: 09/06/1933 Date of Birth/Sex: Treating RN: March 31, 1933 (86 y.o. Kirby Funk Primary Care Chandel Zaun: Dianna Limbo Other Clinician: Referring Dakotah Heiman: Treating Yelitza Reach/Extender: Zenaida Deed, 03/25/2020 in Treatment: 0 Edema Assessment Assessed: [Left: No] [Right: No] Edema: [Left: Ye] [Right:  s] Calf Left: Right: Point of Measurement: 35 cm From Medial Instep 39.5 cm cm Ankle Left: Right: Point of Measurement: 16 cm From Medial Instep 23 cm cm Vascular Assessment Pulses: Dorsalis Pedis Palpable: [Left:Yes] Blood Pressure: Brachial: [Left:157] Ankle: [Left:Dorsalis Pedis: 122 0.78] Electronic Signature(s) Signed: 03/24/2020 4:21:34 PM By: Kela Millin Entered By: Kela Millin on 03/23/2020 15:24:45 -------------------------------------------------------------------------------- Multi Wound Chart Details Patient Name: Date of Service: Denice Paradise [name], Pattiann J. 03/23/2020 2:45 PM Medical Record Number: [id number] Patient Account Number: [id number] Date of Birth/Sex: Treating RN: 01/07/1933 (86 y.o. Helene Shoe, Tammi Klippel Primary Care Ali Mclaurin: Lavone Orn Other Clinician: Referring Vinod Mikesell: Treating Teague Goynes/Extender: Herma Ard, Moses Manners in Treatment: 0 Vital Signs Height(in): 58 Pulse(bpm): 8 Weight(lbs): 601 Blood Pressure(mmHg): 157/54 Body Mass Index(BMI):  39 Temperature(F): 97.6 Respiratory Rate(breaths/min): 19 Photos: [1:No Photos Left, Lateral Lower Leg] [2:No Photos Left, Proximal, Posterior Lower Leg] [3:No Photos Left, Distal, Posterior Lower Leg] Wound Location: [1:Gradually Appeared] [2:Gradually Appeared] [3:Gradually Appeared] Wounding Event: [1:Venous Leg Ulcer] [2:Venous Leg Ulcer] [3:Venous Leg Ulcer] Primary Etiology: [1:Cataracts, Glaucoma, Asthma,] [2:Cataracts, Glaucoma, Asthma,] [3:Cataracts, Glaucoma, Asthma,] Comorbid History: [1:Chronic Obstructive Pulmonary Disease (COPD), Coronary Artery Disease, Hypertension, Myocardial Infarction, Peripheral Venous Disease 02/16/2020] [2:Chronic Obstructive Pulmonary Disease (COPD), Coronary Artery Disease, Hypertension,  Myocardial Infarction, Peripheral Venous Disease 02/16/2020] [3:Chronic Obstructive Pulmonary Disease (COPD), Coronary Artery Disease, Hypertension, Myocardial Infarction, Peripheral Venous Disease 02/16/2020] Date Acquired: [1:0] [2:0] [3:0] Weeks of Treatment: [1:Open] [2:Open] [3:Open] Wound Status: [1:0.3x0.3x0.1] [2:0.5x0.5x0.1] [3:0.7x0.5x0.1] Measurements L x W x D (cm) [1:0.071] [2:0.196] [3:0.275] A (cm) : rea [1:0.007] [2:0.02] [3:0.027] Volume (cm) : [1:Full Thickness Without Exposed] [2:Full Thickness Without Exposed] [3:Full Thickness Without Exposed] Classification: [1:Support Structures Medium] [2:Support Structures Small] [3:Support Structures Small] Exudate Amount: [1:Serous] [2:Serous] [3:Serous] Exudate Type: [1:amber] [2:amber] [3:amber] Exudate Color: [1:Distinct, outline attached] [2:Distinct, outline attached] [3:Distinct, outline attached] Wound Margin: [1:Large (67-100%)] [2:Large (67-100%)] [3:Large (67-100%)] Granulation Amount: [1:Red, Pink] [2:Red, Pink] [3:Red, Pink] Granulation Quality: [1:None Present (0%)] [2:None Present (0%)] [3:None Present (0%)] Necrotic Amount: [1:Fat Layer (Subcutaneous Tissue)] [2:Fat Layer (Subcutaneous  Tissue)] [3:Fat Layer (Subcutaneous Tissue)] Exposed Structures: [1:Exposed: Yes Fascia: No Tendon: No Muscle: No Joint: No Bone: No None] [2:Exposed: Yes Fascia: No Tendon: No Muscle: No Joint: No Bone: No None] [3:Exposed: Yes Fascia: No Tendon: No Muscle: No Joint: No Bone: No None] Epithelialization: [1:Compression Therapy] [2:Compression Therapy] [3:Compression Therapy] Treatment Notes Electronic Signature(s) Signed: 03/25/2020 6:55:41 AM By: Linton Ham MD Signed: 05/01/2020 1:31:05 PM By: Deon Pilling Entered By: Linton Ham on 03/23/2020 16:02:35 -------------------------------------------------------------------------------- Multi-Disciplinary Care Plan Details Patient Name: Date of Service: [facility] [name], Keriana J. 03/23/2020 2:45 PM Medical Record Number: [id number] Patient Account Number: [id number] Date of Birth/Sex: Treating RN: [birth year] (86 y.o. Helene Shoe, Tammi Klippel Primary Care Derryl Uher: Lavone Orn Other Clinician: Referring Latorya Bautch: Treating Momodou Consiglio/Extender: Herma Ard, Moses Manners in Treatment: 0 Active Inactive Abuse / Safety / Falls / Self Care Management Nursing Diagnoses: Potential for falls Goals: Patient will remain injury free related to falls Date Initiated: 03/23/2020 Target Resolution Date: 05/12/2020 Goal Status: Active Patient/caregiver will verbalize understanding of skin care regimen Date Initiated: 03/23/2020 Target Resolution Date: 04/14/2020 Goal Status: Active Interventions: Assess: immobility, friction, shearing, incontinence upon admission and as needed Provide education on fall prevention Notes: Nutrition Nursing Diagnoses: Potential for alteratiion in Nutrition/Potential for imbalanced nutrition Goals: Patient/caregiver agrees to and verbalizes understanding of need to obtain nutritional consultation Date Initiated: 03/23/2020 Target Resolution Date: 04/14/2020 Goal Status: Active Interventions: Provide education on elevated  blood sugars and impact  on wound healing Provide education on nutrition Treatment Activities: Patient referred to Primary Care Physician for further nutritional evaluation : 03/23/2020 Notes: Orientation to the Wound Care Program Nursing Diagnoses: Knowledge deficit related to the wound healing center program Goals: Patient/caregiver will verbalize understanding of the Wound Healing Center Program Date Initiated: 03/23/2020 Target Resolution Date: 04/14/2020 Goal Status: Active Interventions: Provide education on orientation to the wound center Notes: Pain, Acute or Chronic Nursing Diagnoses: Pain, acute or chronic: actual or potential Potential alteration in comfort, pain Goals: Patient will verbalize adequate pain control and receive pain control interventions during procedures as needed Date Initiated: 03/23/2020 Target Resolution Date: 04/14/2020 Goal Status: Active Interventions: Encourage patient to take pain medications as prescribed Provide education on pain management Reposition patient for comfort Treatment Activities: Administer pain control measures as ordered : 03/23/2020 Notes: Electronic Signature(s) Signed: 03/23/2020 4:13:32 PM By: Shawn Stall Signed: 05/01/2020 1:31:05 PM By: Shawn Stall Entered By: Shawn Stall on 03/23/2020 15:47:39 -------------------------------------------------------------------------------- Pain Assessment Details Patient Name: Date of Service: Jenny [name], Treesa J. 03/23/2020 2:45 PM Medical Record Number: [id number] Patient Account Number: [id number] Date of Birth/Sex: Treating RN: 02-12-1933 (86 y.o. Harvest Dark Primary Care Zoey Gilkeson: Kirby Funk Other Clinician: Referring Orie Baxendale: Treating Reniah Cottingham/Extender: Harle Battiest, Vivianne Spence in Treatment: 0 Active Problems Location of Pain Severity and Description of Pain Patient Has Paino No Site Locations Pain Management and Medication Current Pain  Management: Electronic Signature(s) Signed: 03/24/2020 4:21:34 PM By: Cherylin Mylar Entered By: Cherylin Mylar on 03/23/2020 15:39:06 -------------------------------------------------------------------------------- Patient/Caregiver Education Details Patient Name: Date of Service: Jenny [name], Ronnette Hila 4/15/2021andnbsp2:45 PM Medical Record Number: [id number] Patient Account Number: [id number] Date of Birth/Gender: Treating RN: 03-Mar-1933 (86 y.o. [name] Primary Care Physician: Kirby Funk Other Clinician: Referring Physician: Treating Physician/Extender: Dianna Limbo in Treatment: 0 Education Assessment Education Provided To: Patient Education Topics Provided Venous: Handouts: Controlling Swelling with Compression Stockings , Controlling Swelling with Multilayered Compression Wraps Methods: Explain/Verbal, Printed Responses: Reinforcements needed Welcome T [facility]: o Handouts: Welcome T [facility] o Methods: Explain/Verbal, Printed Responses: Reinforcements needed Electronic Signature(s) Signed: 03/23/2020 4:13:32 PM By: Shawn Stall Entered By: Shawn Stall on 03/23/2020 15:48:03 -------------------------------------------------------------------------------- Wound Assessment Details Patient Name: Date of Service: Jenny [name], JERALYNN VAQUERA 03/23/2020 2:45 PM Medical Record Number: [id number] Patient Account Number: [id number] Date of Birth/Sex: Treating RN: [birth year] (86 y.o. Harvest Dark Primary Care Arryana Tolleson: Kirby Funk Other Clinician: Referring Iwalani Templeton: Treating Fahd Galea/Extender: Harle Battiest, Vivianne Spence in Treatment: 0 Wound Status Wound Number: 1 Primary Venous Leg Ulcer Etiology: Wound Location: Left, Lateral Lower Leg Wound Open Wounding Event: Gradually Appeared Status: Date Acquired: 02/16/2020 Comorbid Cataracts, Glaucoma, Asthma, Chronic Obstructive Pulmonary Weeks Of  Treatment: 0 History: Disease (COPD), Coronary Artery Disease, Hypertension, Clustered Wound: No Myocardial Infarction, Peripheral Venous Disease Photos Wound Measurements Length: (cm) 0.3 Width: (cm) 0.3 Depth: (cm) 0.1 Area: (cm) 0.071 Volume: (cm) 0.007 % Reduction in Area: 0% % Reduction in Volume: 0% Epithelialization: None Tunneling: No Undermining: No Wound Description Classification: Full Thickness Without Exposed Support Structures Wound Margin: Distinct, outline attached Exudate Amount: Medium Exudate Type: Serous Exudate Color: amber Foul Odor After Cleansing: No Slough/Fibrino No Wound Bed Granulation Amount: Large (67-100%) Exposed Structure Granulation Quality: Red, Pink Fascia Exposed: No Necrotic Amount: None Present (0%) Fat Layer (Subcutaneous Tissue) Exposed: Yes Tendon Exposed: No Muscle Exposed: No Joint Exposed: No Bone Exposed: No Electronic Signature(s) Signed: 03/24/2020 4:21:34 PM By: Cherylin Mylar Signed: 03/28/2020 1:29:28  PM By: Karl Itoawkins, Destiny Entered By: Karl Itoawkins, Destiny on 03/24/2020 15:16:25 -------------------------------------------------------------------------------- Wound Assessment Details Patient Name: [name] Ronnette HilaBETTY J. 03/23/2020 2:45 PM Medical Record Number: [id number] Patient Account Number: [id number] Date of Birth/Sex: Treating RN: [birth year] (86 y.o. Harvest DarkF) Dwiggins, Shannon Primary Care Darriel Sinquefield: Kirby FunkGriffin, John Other Clinician: Referring Brandan Robicheaux: Treating Clotiel Troop/Extender: Harle Battiestobson, Michael Griffin, Vivianne SpenceJohn Weeks in Treatment: 0 Wound Status Wound Number: 2 Primary Venous Leg Ulcer Etiology: Wound Location: Left, Proximal, Posterior Lower Leg Wound Open Wounding Event: Gradually Appeared Status: Date Acquired: 02/16/2020 Comorbid Cataracts, Glaucoma, Asthma, Chronic Obstructive Pulmonary Weeks Of Treatment: 0 History: Disease (COPD), Coronary Artery Disease, Hypertension, Clustered Wound: No  Myocardial Infarction, Peripheral Venous Disease Photos Wound Measurements Length: (cm) 0.5 Width: (cm) 0.5 Depth: (cm) 0.1 Area: (cm) 0.196 Volume: (cm) 0.02 % Reduction in Area: 0% % Reduction in Volume: 0% Epithelialization: None Tunneling: No Undermining: No Wound Description Classification: Full Thickness Without Exposed Support Structures Wound Margin: Distinct, outline attached Exudate Amount: Small Exudate Type: Serous Exudate Color: amber Foul Odor After Cleansing: No Slough/Fibrino No Wound Bed Granulation Amount: Large (67-100%) Exposed Structure Granulation Quality: Red, Pink Fascia Exposed: No Necrotic Amount: None Present (0%) Fat Layer (Subcutaneous Tissue) Exposed: Yes Tendon Exposed: No Muscle Exposed: No Joint Exposed: No Bone Exposed: No Electronic Signature(s) Signed: 03/24/2020 4:21:34 PM By: Cherylin Mylarwiggins, Shannon Signed: 03/28/2020 1:29:28 PM By: Karl Itoawkins, Destiny Entered By: Karl Itoawkins, Destiny on 03/24/2020 15:16:46 -------------------------------------------------------------------------------- Wound Assessment Details Patient Name: Date of Service: Jenny [name], Hadasa J. 03/23/2020 2:45 PM Medical Record Number: [id number] Patient Account Number: [id number] Date of Birth/Sex: Treating RN: [birth year] (86 y.o. Harvest DarkF) Dwiggins, Shannon Primary Care Jeury Mcnab: Kirby FunkGriffin, John Other Clinician: Referring Keondre Markson: Treating Venba Zenner/Extender: Harle Battiestobson, Michael Griffin, Vivianne SpenceJohn Weeks in Treatment: 0 Wound Status Wound Number: 3 Primary Venous Leg Ulcer Etiology: Wound Location: Left, Distal, Posterior Lower Leg Wound Open Wounding Event: Gradually Appeared Status: Date Acquired: 02/16/2020 Comorbid Cataracts, Glaucoma, Asthma, Chronic Obstructive Pulmonary Weeks Of Treatment: 0 History: Disease (COPD), Coronary Artery Disease, Hypertension, Clustered Wound: No Myocardial Infarction, Peripheral Venous Disease Photos Wound Measurements Length: (cm) 0.7 Width: (cm)  0.5 Depth: (cm) 0.1 Area: (cm) 0.275 Volume: (cm) 0.027 % Reduction in Area: 0% % Reduction in Volume: 0% Epithelialization: None Tunneling: No Undermining: No Wound Description Classification: Full Thickness Without Exposed Support Structures Wound Margin: Distinct, outline attached Exudate Amount: Small Exudate Type: Serous Exudate Color: amber Foul Odor After Cleansing: No Slough/Fibrino No Wound Bed Granulation Amount: Large (67-100%) Exposed Structure Granulation Quality: Red, Pink Fascia Exposed: No Necrotic Amount: None Present (0%) Fat Layer (Subcutaneous Tissue) Exposed: Yes Tendon Exposed: No Muscle Exposed: No Joint Exposed: No Bone Exposed: No Electronic Signature(s) Signed: 03/24/2020 4:21:34 PM By: Cherylin Mylarwiggins, Shannon Signed: 03/28/2020 1:29:28 PM By: Karl Itoawkins, Destiny Entered By: Karl Itoawkins, Destiny on 03/24/2020 15:18:21 -------------------------------------------------------------------------------- Vitals Details Patient Name: Date of Service: Jenny [name], Jalexus J. 03/23/2020 2:45 PM Medical Record Number: [id number] Patient Account Number: [id number] Date of Birth/Sex: Treating RN: [birth year] (86 y.o. Harvest DarkF) Dwiggins, Shannon Primary Care Henery Betzold: Kirby FunkGriffin, John Other Clinician: Referring Sacha Radloff: Treating Lydiana Milley/Extender: Harle Battiestobson, Michael Griffin, Vivianne SpenceJohn Weeks in Treatment: 0 Vital Signs Time Taken: 15:00 Temperature (F): 97.6 Height (in): 62 Pulse (bpm): 58 Source: Stated Respiratory Rate (breaths/min): 19 Weight (lbs): 213 Blood Pressure (mmHg): 157/54 Source: Stated Reference Range: 80 - 120 mg / dl Body Mass Index (BMI): 39 Electronic Signature(s) Signed: 03/24/2020 4:21:34 PM By: Cherylin Mylarwiggins, Shannon Entered By: Cherylin Mylarwiggins, Shannon on 03/23/2020 15:06:31

## 2020-05-12 DIAGNOSIS — Z23 Encounter for immunization: Secondary | ICD-10-CM | POA: Diagnosis not present

## 2020-06-07 DIAGNOSIS — I10 Essential (primary) hypertension: Secondary | ICD-10-CM | POA: Diagnosis not present

## 2020-06-07 DIAGNOSIS — J449 Chronic obstructive pulmonary disease, unspecified: Secondary | ICD-10-CM | POA: Diagnosis not present

## 2020-06-07 DIAGNOSIS — H409 Unspecified glaucoma: Secondary | ICD-10-CM | POA: Diagnosis not present

## 2020-06-07 DIAGNOSIS — I2510 Atherosclerotic heart disease of native coronary artery without angina pectoris: Secondary | ICD-10-CM | POA: Diagnosis not present

## 2020-06-07 DIAGNOSIS — M8580 Other specified disorders of bone density and structure, unspecified site: Secondary | ICD-10-CM | POA: Diagnosis not present

## 2020-06-07 DIAGNOSIS — E782 Mixed hyperlipidemia: Secondary | ICD-10-CM | POA: Diagnosis not present

## 2020-06-07 DIAGNOSIS — I252 Old myocardial infarction: Secondary | ICD-10-CM | POA: Diagnosis not present

## 2020-06-14 DIAGNOSIS — H409 Unspecified glaucoma: Secondary | ICD-10-CM | POA: Diagnosis not present

## 2020-06-14 DIAGNOSIS — I2510 Atherosclerotic heart disease of native coronary artery without angina pectoris: Secondary | ICD-10-CM | POA: Diagnosis not present

## 2020-06-14 DIAGNOSIS — I252 Old myocardial infarction: Secondary | ICD-10-CM | POA: Diagnosis not present

## 2020-06-14 DIAGNOSIS — E782 Mixed hyperlipidemia: Secondary | ICD-10-CM | POA: Diagnosis not present

## 2020-06-14 DIAGNOSIS — J449 Chronic obstructive pulmonary disease, unspecified: Secondary | ICD-10-CM | POA: Diagnosis not present

## 2020-06-14 DIAGNOSIS — M8580 Other specified disorders of bone density and structure, unspecified site: Secondary | ICD-10-CM | POA: Diagnosis not present

## 2020-06-14 DIAGNOSIS — I10 Essential (primary) hypertension: Secondary | ICD-10-CM | POA: Diagnosis not present

## 2020-06-28 DIAGNOSIS — S40022A Contusion of left upper arm, initial encounter: Secondary | ICD-10-CM | POA: Diagnosis not present

## 2020-07-24 DIAGNOSIS — I252 Old myocardial infarction: Secondary | ICD-10-CM | POA: Diagnosis not present

## 2020-07-24 DIAGNOSIS — I2510 Atherosclerotic heart disease of native coronary artery without angina pectoris: Secondary | ICD-10-CM | POA: Diagnosis not present

## 2020-07-24 DIAGNOSIS — H409 Unspecified glaucoma: Secondary | ICD-10-CM | POA: Diagnosis not present

## 2020-07-24 DIAGNOSIS — J449 Chronic obstructive pulmonary disease, unspecified: Secondary | ICD-10-CM | POA: Diagnosis not present

## 2020-07-24 DIAGNOSIS — M8580 Other specified disorders of bone density and structure, unspecified site: Secondary | ICD-10-CM | POA: Diagnosis not present

## 2020-07-24 DIAGNOSIS — I10 Essential (primary) hypertension: Secondary | ICD-10-CM | POA: Diagnosis not present

## 2020-07-24 DIAGNOSIS — E782 Mixed hyperlipidemia: Secondary | ICD-10-CM | POA: Diagnosis not present

## 2020-08-09 DIAGNOSIS — H2589 Other age-related cataract: Secondary | ICD-10-CM | POA: Diagnosis not present

## 2020-08-09 DIAGNOSIS — H402221 Chronic angle-closure glaucoma, left eye, mild stage: Secondary | ICD-10-CM | POA: Diagnosis not present

## 2020-08-09 DIAGNOSIS — H2512 Age-related nuclear cataract, left eye: Secondary | ICD-10-CM | POA: Diagnosis not present

## 2020-08-09 DIAGNOSIS — Z9889 Other specified postprocedural states: Secondary | ICD-10-CM | POA: Diagnosis not present

## 2020-08-17 DIAGNOSIS — I2510 Atherosclerotic heart disease of native coronary artery without angina pectoris: Secondary | ICD-10-CM | POA: Diagnosis not present

## 2020-08-17 DIAGNOSIS — R202 Paresthesia of skin: Secondary | ICD-10-CM | POA: Diagnosis not present

## 2020-08-17 DIAGNOSIS — I10 Essential (primary) hypertension: Secondary | ICD-10-CM | POA: Diagnosis not present

## 2020-08-17 DIAGNOSIS — Z23 Encounter for immunization: Secondary | ICD-10-CM | POA: Diagnosis not present

## 2020-08-17 DIAGNOSIS — J449 Chronic obstructive pulmonary disease, unspecified: Secondary | ICD-10-CM | POA: Diagnosis not present

## 2020-09-11 DIAGNOSIS — J449 Chronic obstructive pulmonary disease, unspecified: Secondary | ICD-10-CM | POA: Diagnosis not present

## 2020-09-11 DIAGNOSIS — I252 Old myocardial infarction: Secondary | ICD-10-CM | POA: Diagnosis not present

## 2020-09-11 DIAGNOSIS — H409 Unspecified glaucoma: Secondary | ICD-10-CM | POA: Diagnosis not present

## 2020-09-11 DIAGNOSIS — I2510 Atherosclerotic heart disease of native coronary artery without angina pectoris: Secondary | ICD-10-CM | POA: Diagnosis not present

## 2020-09-11 DIAGNOSIS — M8580 Other specified disorders of bone density and structure, unspecified site: Secondary | ICD-10-CM | POA: Diagnosis not present

## 2020-09-11 DIAGNOSIS — E782 Mixed hyperlipidemia: Secondary | ICD-10-CM | POA: Diagnosis not present

## 2020-09-11 DIAGNOSIS — I10 Essential (primary) hypertension: Secondary | ICD-10-CM | POA: Diagnosis not present

## 2020-09-11 DIAGNOSIS — H402224 Chronic angle-closure glaucoma, left eye, indeterminate stage: Secondary | ICD-10-CM | POA: Diagnosis not present

## 2020-11-21 DIAGNOSIS — H409 Unspecified glaucoma: Secondary | ICD-10-CM | POA: Diagnosis not present

## 2020-11-21 DIAGNOSIS — J449 Chronic obstructive pulmonary disease, unspecified: Secondary | ICD-10-CM | POA: Diagnosis not present

## 2020-11-21 DIAGNOSIS — M8580 Other specified disorders of bone density and structure, unspecified site: Secondary | ICD-10-CM | POA: Diagnosis not present

## 2020-11-21 DIAGNOSIS — H402224 Chronic angle-closure glaucoma, left eye, indeterminate stage: Secondary | ICD-10-CM | POA: Diagnosis not present

## 2020-11-21 DIAGNOSIS — I2510 Atherosclerotic heart disease of native coronary artery without angina pectoris: Secondary | ICD-10-CM | POA: Diagnosis not present

## 2020-11-21 DIAGNOSIS — I252 Old myocardial infarction: Secondary | ICD-10-CM | POA: Diagnosis not present

## 2020-11-21 DIAGNOSIS — E782 Mixed hyperlipidemia: Secondary | ICD-10-CM | POA: Diagnosis not present

## 2020-11-21 DIAGNOSIS — I10 Essential (primary) hypertension: Secondary | ICD-10-CM | POA: Diagnosis not present

## 2021-02-02 ENCOUNTER — Encounter: Payer: Self-pay | Admitting: Cardiovascular Disease

## 2021-02-02 ENCOUNTER — Other Ambulatory Visit: Payer: Self-pay

## 2021-02-02 ENCOUNTER — Ambulatory Visit (INDEPENDENT_AMBULATORY_CARE_PROVIDER_SITE_OTHER): Payer: Medicare Other | Admitting: Cardiovascular Disease

## 2021-02-02 VITALS — BP 178/80 | HR 56 | Ht 62.0 in | Wt 205.0 lb

## 2021-02-02 DIAGNOSIS — Z720 Tobacco use: Secondary | ICD-10-CM | POA: Diagnosis not present

## 2021-02-02 DIAGNOSIS — E7800 Pure hypercholesterolemia, unspecified: Secondary | ICD-10-CM | POA: Diagnosis not present

## 2021-02-02 DIAGNOSIS — I6523 Occlusion and stenosis of bilateral carotid arteries: Secondary | ICD-10-CM | POA: Diagnosis not present

## 2021-02-02 DIAGNOSIS — I10 Essential (primary) hypertension: Secondary | ICD-10-CM | POA: Diagnosis not present

## 2021-02-02 DIAGNOSIS — I2510 Atherosclerotic heart disease of native coronary artery without angina pectoris: Secondary | ICD-10-CM | POA: Diagnosis not present

## 2021-02-02 DIAGNOSIS — I440 Atrioventricular block, first degree: Secondary | ICD-10-CM

## 2021-02-02 MED ORDER — LOSARTAN POTASSIUM 25 MG PO TABS: 25.0000 mg | ORAL_TABLET | Freq: Every day | ORAL | 3 refills | Status: DC

## 2021-02-02 NOTE — Progress Notes (Signed)
Chief Complaint  Patient presents with  . Follow-up    CAD   History of Present Illness: 86 yo female with history of CAD, HTN, HLD, COPD, tobacco abuse, carotid artery disease, Mobitz 1 heart block and DM here today cardiac follow up. In July of 2010 she had an inferior MI treated with a drug eluting stent in the right coronary artery. She had residual 70% LAD stenosis and an ejection fraction of 60%. She also has chronic shortness of breath and moderate chronic obstructive pulmonary disease by pulmonary function testing. She has continued to smoke despite advice against this.  Stress myoview 12/20/13 with no ischemia, normal LV function. Mild bilateral carotid artery disease by dopplers in January 2020.  She is here today for follow up. The patient denies any chest pain, dyspnea, palpitations, lower extremity edema, orthopnea, PND, dizziness, near syncope or syncope.    Primary Care Physician: Kirby Funk, MD  Past Medical History:  Diagnosis Date  . ACUT DUOD ULCER W/HEMORR&PERF W/O MENTION OBST 10/05/2009   NSAID induced  . ALLERGIC RHINITIS CAUSE UNSPECIFIED   . ANEMIA-NOS   . CAD (coronary artery disease) 06/2009   DEs RCA with 70% LAD and EF 60%  . COPD    mild obst on PFTs 03/2010  . Diabetes mellitus 06/2010 dx   Mild, diet controlled  . GLAUCOMA   . HYPERLIPIDEMIA   . HYPERTENSION, BENIGN   . MYOCARDIAL INFARCTION 06/2009   des to rca  . TOBACCO ABUSE     Past Surgical History:  Procedure Laterality Date  . HEMORRHOID SURGERY  1990  . Right knee surgery      Current Outpatient Medications  Medication Sig Dispense Refill  . ANORO ELLIPTA 62.5-25 MCG/INH AEPB Inhale 1 puff into the lungs every morning.    . ASPIRIN ADULT PO Take 85 mg by mouth daily.    . clopidogrel (PLAVIX) 75 MG tablet TAKE ONE TABLET EVERY DAY 30 tablet 11  . dorzolamide (TRUSOPT) 2 % ophthalmic solution 1 drop 2 (two) times daily.    . famotidine (PEPCID) 20 MG tablet Take 20 mg by mouth 2  (two) times daily.    . furosemide (LASIX) 40 MG tablet Take 40 mg by mouth as needed for fluid.    Marland Kitchen gabapentin (NEURONTIN) 100 MG capsule Take 200 mg by mouth 2 (two) times daily. Pt takes one tablet every am, two tablets every pm    . latanoprost (XALATAN) 0.005 % ophthalmic solution Place 1 drop into both eyes at bedtime.    Marland Kitchen losartan (COZAAR) 25 MG tablet Take 1 tablet (25 mg total) by mouth daily. 90 tablet 3  . magnesium hydroxide (MILK OF MAGNESIA) 400 MG/5ML suspension Take 30 mLs by mouth as needed for mild constipation.    . metoprolol succinate (TOPROL-XL) 50 MG 24 hr tablet Take 1 tablet (50 mg total) by mouth daily. Take with or immediately following a meal. 90 tablet 3  . NITROSTAT 0.4 MG SL tablet DISSOLVE ONE TABLET UNDER TONGUE AS NEEDED FOR CHEST PAIN - MAY REPEAT TWICE-IF NO RELIEF GO TO NEAREST HOSPITAL ER 25 tablet 0  . potassium chloride SA (K-DUR,KLOR-CON) 20 MEQ tablet Take 10 mEq by mouth See admin instructions. Taking 1 tablet on same day if taking lasix 40mg     . rosuvastatin (CRESTOR) 20 MG tablet Take 20 mg by mouth daily.    . VENTOLIN HFA 108 (90 BASE) MCG/ACT inhaler INHALE ONE PUFF EVERY 6 HOURS AS NEEDED FOR SHORTNESS  OF BREATH 18 g 2   No current facility-administered medications for this visit.    Allergies  Allergen Reactions  . Atorvastatin Itching    Itching and myaliga  . Meperidine Hcl Other (See Comments)    Not known  . Pravastatin Rash    rash    Social History   Socioeconomic History  . Marital status: Widowed    Spouse name: Not on file  . Number of children: Not on file  . Years of education: Not on file  . Highest education level: Not on file  Occupational History  . Not on file  Tobacco Use  . Smoking status: Current Every Day Smoker    Packs/day: 0.50    Years: 60.00    Pack years: 30.00  . Smokeless tobacco: Never Used  . Tobacco comment: She lives in [place] with her significant other (Charles Hook)0  Vaping Use  . Vaping  Use: Never used  Substance and Sexual Activity  . Alcohol use: Yes    Comment: Occassional  . Drug use: No  . Sexual activity: Not on file  Other Topics Concern  . Not on file  Social History Narrative   Lives in [place] with her SO - charles hook   Retired -Acupuncturist   Enjoys travel - live Engineer, maintenance (IT)   Social Determinants of Corporate investment banker Strain: Not on [employer] Insecurity: Not on file  Transportation Needs: Not on file  Physical Activity: Not on file  Stress: Not on file  Social Connections: Not on file  Intimate Partner Violence: Not on file    Family History  Problem Relation Age of Onset  . Hypertension Mother   . Ulcers Mother   . Stomach cancer Father   . Stroke Maternal Grandmother   . Heart attack Neg Hx     Review of Systems:  As stated in the HPI and otherwise negative.   BP (!) 178/80   Pulse (!) 56   Ht 5\' 2"  (1.575 m)   Wt 205 lb (93 kg)   SpO2 95%   BMI 37.49 kg/m   Physical Examination:  General: Well developed, well nourished, NAD  HEENT: OP clear, mucus membranes moist  SKIN: warm, dry. No rashes. Neuro: No focal deficits  Musculoskeletal: Muscle strength 5/5 all ext  Psychiatric: Mood and affect normal  Neck: No JVD, no carotid bruits, no thyromegaly, no lymphadenopathy.  Lungs:Clear bilaterally, no wheezes, rhonci, crackles Cardiovascular: Regular rate and rhythm. No murmurs, gallops or rubs. Abdomen:Soft. Bowel sounds present. Non-tender.  Extremities: No lower extremity edema. Pulses are 2 + in the bilateral DP/PT.  EKG:  EKG is ordered today The ekg ordered today demonstrates Sinus, first degree AV block with long PR interval.   Recent Labs: No results found for requested labs within last 8760 hours.    Wt Readings from Last 3 Encounters:  02/02/21 205 lb (93 kg)  12/13/19 215 lb (97.5 kg)  10/14/18 213 lb 12.8 oz (97 kg)     Other studies Reviewed: Additional studies/ records that were  reviewed today include: . Review of the above records demonstrates:   Assessment and Plan:   1. CAD without angina: She has been known to have a moderate LAD stenosis since her cath in 2010 and has continued smoking since then. She has no chest pain or change in baseline dyspnea. Continue ASA, Plavix, statin and beta blocker.     2. HYPERTENSION: BP is elevated today. Has been  elevated at home. Will start Cozaar 25 mg po daily. BMET one week.    3. HYPERLIPIDEMIA: Lipids followed in primary care. Will continue statin  4. Carotid artery disease: Mild bilateral carotid disease by dopplers January 2020.    5. Tobacco abuse: Smoking cessation is recommended.   6. History of Second degree AV block, type 1/First degree AV block: No dizziness. No indication for a pacemaker at this time  Current medicines are reviewed at length with the patient today.  The patient does not have concerns regarding medicines.  The following changes have been made:  no change  Labs/ tests ordered today include:   Orders Placed This Encounter  Procedures  . Basic metabolic panel  . EKG 12-Lead   Disposition:   FU with me in 12  months  Signed, Verne Carrow, MD 02/02/2021 12:08 PM    [facility] [address] [place], [place], [place]  [address] Phone: [phone]; Fax: [phone]

## 2021-02-02 NOTE — Patient Instructions (Signed)
Medication Instructions:  Your physician has recommended you make the following change in your medication:  1.) start Losartan (Cozaar) 25 mg - take one tablet daily for blood pressure  *If you need a refill on your cardiac medications before your next appointment, please call your pharmacy*   Lab Work: Please return in 7-10 days for blood work Designer, jewellery)   Testing/Procedures: none   Follow-Up: At [facility], you and your health needs are our priority.  As part of our continuing mission to provide you with exceptional heart care, we have created designated Provider Care Teams.  These Care Teams include your primary Cardiologist (physician) and Advanced Practice Providers (APPs -  Physician Assistants and Nurse Practitioners) who all work together to provide you with the care you need, when you need it.  We recommend signing up for the patient portal called "MyChart".  Sign up information is provided on this After Visit Summary.  MyChart is used to connect with patients for Virtual Visits (Telemedicine).  Patients are able to view lab/test results, encounter notes, upcoming appointments, etc.  Non-urgent messages can be sent to your provider as well.   To learn more about what you can do with MyChart, go to [email or web address].    Your next appointment:   12 month(s)  The format for your next appointment:   In Person  Provider:   You may see Verne Carrow, MD or one of the following Advanced Practice Providers on your designated Care Team:    Ronie Spies, PA-C  Jacolyn Reedy, PA-C  Other Instructions

## 2021-02-12 ENCOUNTER — Other Ambulatory Visit: Payer: Medicare Other | Admitting: *Deleted

## 2021-02-12 ENCOUNTER — Other Ambulatory Visit: Payer: Self-pay

## 2021-02-12 DIAGNOSIS — E7800 Pure hypercholesterolemia, unspecified: Secondary | ICD-10-CM

## 2021-02-12 DIAGNOSIS — Z720 Tobacco use: Secondary | ICD-10-CM | POA: Diagnosis not present

## 2021-02-12 DIAGNOSIS — I10 Essential (primary) hypertension: Secondary | ICD-10-CM | POA: Diagnosis not present

## 2021-02-12 DIAGNOSIS — I440 Atrioventricular block, first degree: Secondary | ICD-10-CM | POA: Diagnosis not present

## 2021-02-12 DIAGNOSIS — I2510 Atherosclerotic heart disease of native coronary artery without angina pectoris: Secondary | ICD-10-CM | POA: Diagnosis not present

## 2021-02-12 DIAGNOSIS — I6523 Occlusion and stenosis of bilateral carotid arteries: Secondary | ICD-10-CM

## 2021-02-13 LAB — BASIC METABOLIC PANEL
BUN/Creatinine Ratio: 17 (ref 12–28)
BUN: 14 mg/dL (ref 8–27)
CO2: 23 mmol/L (ref 20–29)
Calcium: 8.8 mg/dL (ref 8.7–10.3)
Chloride: 99 mmol/L (ref 96–106)
Creatinine, Ser: 0.82 mg/dL (ref 0.57–1.00)
Glucose: 154 mg/dL — ABNORMAL HIGH (ref 65–99)
Potassium: 4 mmol/L (ref 3.5–5.2)
Sodium: 138 mmol/L (ref 134–144)
eGFR: 70 mL/min/{1.73_m2} (ref >59)

## 2021-02-15 DIAGNOSIS — J449 Chronic obstructive pulmonary disease, unspecified: Secondary | ICD-10-CM | POA: Diagnosis not present

## 2021-02-15 DIAGNOSIS — E782 Mixed hyperlipidemia: Secondary | ICD-10-CM | POA: Diagnosis not present

## 2021-02-15 DIAGNOSIS — R7309 Other abnormal glucose: Secondary | ICD-10-CM | POA: Diagnosis not present

## 2021-02-15 DIAGNOSIS — F17210 Nicotine dependence, cigarettes, uncomplicated: Secondary | ICD-10-CM | POA: Diagnosis not present

## 2021-02-15 DIAGNOSIS — I10 Essential (primary) hypertension: Secondary | ICD-10-CM | POA: Diagnosis not present

## 2021-02-15 DIAGNOSIS — R202 Paresthesia of skin: Secondary | ICD-10-CM | POA: Diagnosis not present

## 2021-02-15 DIAGNOSIS — Z0000 Encounter for general adult medical examination without abnormal findings: Secondary | ICD-10-CM | POA: Diagnosis not present

## 2021-02-15 DIAGNOSIS — Z6839 Body mass index (BMI) 39.0-39.9, adult: Secondary | ICD-10-CM | POA: Diagnosis not present

## 2021-02-15 DIAGNOSIS — I872 Venous insufficiency (chronic) (peripheral): Secondary | ICD-10-CM | POA: Diagnosis not present

## 2021-02-19 DIAGNOSIS — H402221 Chronic angle-closure glaucoma, left eye, mild stage: Secondary | ICD-10-CM | POA: Diagnosis not present

## 2021-04-05 DIAGNOSIS — I252 Old myocardial infarction: Secondary | ICD-10-CM | POA: Diagnosis not present

## 2021-04-05 DIAGNOSIS — H409 Unspecified glaucoma: Secondary | ICD-10-CM | POA: Diagnosis not present

## 2021-04-05 DIAGNOSIS — E782 Mixed hyperlipidemia: Secondary | ICD-10-CM | POA: Diagnosis not present

## 2021-04-05 DIAGNOSIS — I10 Essential (primary) hypertension: Secondary | ICD-10-CM | POA: Diagnosis not present

## 2021-04-05 DIAGNOSIS — H402224 Chronic angle-closure glaucoma, left eye, indeterminate stage: Secondary | ICD-10-CM | POA: Diagnosis not present

## 2021-04-05 DIAGNOSIS — J449 Chronic obstructive pulmonary disease, unspecified: Secondary | ICD-10-CM | POA: Diagnosis not present

## 2021-04-05 DIAGNOSIS — I2510 Atherosclerotic heart disease of native coronary artery without angina pectoris: Secondary | ICD-10-CM | POA: Diagnosis not present

## 2021-04-05 DIAGNOSIS — M8580 Other specified disorders of bone density and structure, unspecified site: Secondary | ICD-10-CM | POA: Diagnosis not present

## 2021-05-04 DIAGNOSIS — I10 Essential (primary) hypertension: Secondary | ICD-10-CM | POA: Diagnosis not present

## 2021-05-04 DIAGNOSIS — I252 Old myocardial infarction: Secondary | ICD-10-CM | POA: Diagnosis not present

## 2021-05-04 DIAGNOSIS — E782 Mixed hyperlipidemia: Secondary | ICD-10-CM | POA: Diagnosis not present

## 2021-05-04 DIAGNOSIS — H409 Unspecified glaucoma: Secondary | ICD-10-CM | POA: Diagnosis not present

## 2021-05-04 DIAGNOSIS — H402224 Chronic angle-closure glaucoma, left eye, indeterminate stage: Secondary | ICD-10-CM | POA: Diagnosis not present

## 2021-05-04 DIAGNOSIS — I2510 Atherosclerotic heart disease of native coronary artery without angina pectoris: Secondary | ICD-10-CM | POA: Diagnosis not present

## 2021-05-04 DIAGNOSIS — M8580 Other specified disorders of bone density and structure, unspecified site: Secondary | ICD-10-CM | POA: Diagnosis not present

## 2021-05-04 DIAGNOSIS — J449 Chronic obstructive pulmonary disease, unspecified: Secondary | ICD-10-CM | POA: Diagnosis not present

## 2021-05-23 DIAGNOSIS — M50322 Other cervical disc degeneration at C5-C6 level: Secondary | ICD-10-CM | POA: Diagnosis not present

## 2021-05-23 DIAGNOSIS — M9901 Segmental and somatic dysfunction of cervical region: Secondary | ICD-10-CM | POA: Diagnosis not present

## 2021-05-24 DIAGNOSIS — M9901 Segmental and somatic dysfunction of cervical region: Secondary | ICD-10-CM | POA: Diagnosis not present

## 2021-05-24 DIAGNOSIS — M50322 Other cervical disc degeneration at C5-C6 level: Secondary | ICD-10-CM | POA: Diagnosis not present

## 2021-07-05 DIAGNOSIS — I10 Essential (primary) hypertension: Secondary | ICD-10-CM | POA: Diagnosis not present

## 2021-07-05 DIAGNOSIS — M8580 Other specified disorders of bone density and structure, unspecified site: Secondary | ICD-10-CM | POA: Diagnosis not present

## 2021-07-05 DIAGNOSIS — H409 Unspecified glaucoma: Secondary | ICD-10-CM | POA: Diagnosis not present

## 2021-07-05 DIAGNOSIS — E782 Mixed hyperlipidemia: Secondary | ICD-10-CM | POA: Diagnosis not present

## 2021-07-05 DIAGNOSIS — I2510 Atherosclerotic heart disease of native coronary artery without angina pectoris: Secondary | ICD-10-CM | POA: Diagnosis not present

## 2021-07-05 DIAGNOSIS — I252 Old myocardial infarction: Secondary | ICD-10-CM | POA: Diagnosis not present

## 2021-07-05 DIAGNOSIS — H402224 Chronic angle-closure glaucoma, left eye, indeterminate stage: Secondary | ICD-10-CM | POA: Diagnosis not present

## 2021-07-05 DIAGNOSIS — J449 Chronic obstructive pulmonary disease, unspecified: Secondary | ICD-10-CM | POA: Diagnosis not present

## 2021-07-23 DIAGNOSIS — H402224 Chronic angle-closure glaucoma, left eye, indeterminate stage: Secondary | ICD-10-CM | POA: Diagnosis not present

## 2021-07-23 DIAGNOSIS — M8580 Other specified disorders of bone density and structure, unspecified site: Secondary | ICD-10-CM | POA: Diagnosis not present

## 2021-07-23 DIAGNOSIS — I2510 Atherosclerotic heart disease of native coronary artery without angina pectoris: Secondary | ICD-10-CM | POA: Diagnosis not present

## 2021-07-23 DIAGNOSIS — E782 Mixed hyperlipidemia: Secondary | ICD-10-CM | POA: Diagnosis not present

## 2021-07-23 DIAGNOSIS — I252 Old myocardial infarction: Secondary | ICD-10-CM | POA: Diagnosis not present

## 2021-07-23 DIAGNOSIS — J449 Chronic obstructive pulmonary disease, unspecified: Secondary | ICD-10-CM | POA: Diagnosis not present

## 2021-07-23 DIAGNOSIS — I10 Essential (primary) hypertension: Secondary | ICD-10-CM | POA: Diagnosis not present

## 2021-07-23 DIAGNOSIS — H409 Unspecified glaucoma: Secondary | ICD-10-CM | POA: Diagnosis not present

## 2021-08-08 DIAGNOSIS — I10 Essential (primary) hypertension: Secondary | ICD-10-CM | POA: Diagnosis not present

## 2021-08-21 DIAGNOSIS — L03116 Cellulitis of left lower limb: Secondary | ICD-10-CM | POA: Diagnosis not present

## 2021-08-21 DIAGNOSIS — J449 Chronic obstructive pulmonary disease, unspecified: Secondary | ICD-10-CM | POA: Diagnosis not present

## 2021-08-21 DIAGNOSIS — R600 Localized edema: Secondary | ICD-10-CM | POA: Diagnosis not present

## 2021-08-21 DIAGNOSIS — L03115 Cellulitis of right lower limb: Secondary | ICD-10-CM | POA: Diagnosis not present

## 2021-08-21 DIAGNOSIS — R0609 Other forms of dyspnea: Secondary | ICD-10-CM | POA: Diagnosis not present

## 2021-08-21 DIAGNOSIS — I872 Venous insufficiency (chronic) (peripheral): Secondary | ICD-10-CM | POA: Diagnosis not present

## 2021-08-22 ENCOUNTER — Inpatient Hospital Stay
Admission: EM | Admit: 2021-08-22 | Discharge: 2021-08-27 | DRG: 602 | Disposition: A | Discharge status: Home Health | Payer: Medicare Other | Attending: Student | Admitting: Student

## 2021-08-22 ENCOUNTER — Inpatient Hospital Stay (HOSPITAL_COMMUNITY)
Admit: 2021-08-22 | Discharge: 2021-08-22 | Disposition: A | Discharge status: Home / Self Care | Payer: Medicare Other | Attending: Internal Medicine | Admitting: Internal Medicine

## 2021-08-22 ENCOUNTER — Other Ambulatory Visit: Payer: Self-pay

## 2021-08-22 ENCOUNTER — Emergency Department: Payer: Medicare Other

## 2021-08-22 ENCOUNTER — Inpatient Hospital Stay: Payer: Medicare Other

## 2021-08-22 ENCOUNTER — Encounter: Payer: Self-pay | Admitting: Emergency Medicine

## 2021-08-22 DIAGNOSIS — Z955 Presence of coronary angioplasty implant and graft: Secondary | ICD-10-CM | POA: Diagnosis not present

## 2021-08-22 DIAGNOSIS — R54 Age-related physical debility: Secondary | ICD-10-CM | POA: Diagnosis not present

## 2021-08-22 DIAGNOSIS — L0390 Cellulitis, unspecified: Secondary | ICD-10-CM | POA: Diagnosis present

## 2021-08-22 DIAGNOSIS — J90 Pleural effusion, not elsewhere classified: Secondary | ICD-10-CM | POA: Diagnosis not present

## 2021-08-22 DIAGNOSIS — Z20822 Contact with and (suspected) exposure to covid-19: Secondary | ICD-10-CM | POA: Diagnosis present

## 2021-08-22 DIAGNOSIS — I878 Other specified disorders of veins: Secondary | ICD-10-CM | POA: Diagnosis present

## 2021-08-22 DIAGNOSIS — E1151 Type 2 diabetes mellitus with diabetic peripheral angiopathy without gangrene: Secondary | ICD-10-CM | POA: Diagnosis present

## 2021-08-22 DIAGNOSIS — F17200 Nicotine dependence, unspecified, uncomplicated: Secondary | ICD-10-CM | POA: Diagnosis not present

## 2021-08-22 DIAGNOSIS — I272 Pulmonary hypertension, unspecified: Secondary | ICD-10-CM | POA: Diagnosis not present

## 2021-08-22 DIAGNOSIS — E785 Hyperlipidemia, unspecified: Secondary | ICD-10-CM | POA: Diagnosis present

## 2021-08-22 DIAGNOSIS — I5031 Acute diastolic (congestive) heart failure: Secondary | ICD-10-CM | POA: Diagnosis not present

## 2021-08-22 DIAGNOSIS — L03115 Cellulitis of right lower limb: Secondary | ICD-10-CM | POA: Diagnosis not present

## 2021-08-22 DIAGNOSIS — J449 Chronic obstructive pulmonary disease, unspecified: Secondary | ICD-10-CM | POA: Diagnosis present

## 2021-08-22 DIAGNOSIS — Z800 Family history of malignant neoplasm of digestive organs: Secondary | ICD-10-CM | POA: Diagnosis not present

## 2021-08-22 DIAGNOSIS — I4819 Other persistent atrial fibrillation: Secondary | ICD-10-CM | POA: Diagnosis present

## 2021-08-22 DIAGNOSIS — I5033 Acute on chronic diastolic (congestive) heart failure: Secondary | ICD-10-CM | POA: Diagnosis present

## 2021-08-22 DIAGNOSIS — L03119 Cellulitis of unspecified part of limb: Secondary | ICD-10-CM | POA: Diagnosis present

## 2021-08-22 DIAGNOSIS — I110 Hypertensive heart disease with heart failure: Secondary | ICD-10-CM | POA: Diagnosis present

## 2021-08-22 DIAGNOSIS — I872 Venous insufficiency (chronic) (peripheral): Secondary | ICD-10-CM | POA: Diagnosis present

## 2021-08-22 DIAGNOSIS — I10 Essential (primary) hypertension: Secondary | ICD-10-CM | POA: Diagnosis not present

## 2021-08-22 DIAGNOSIS — L97929 Non-pressure chronic ulcer of unspecified part of left lower leg with unspecified severity: Secondary | ICD-10-CM | POA: Diagnosis present

## 2021-08-22 DIAGNOSIS — Z79899 Other long term (current) drug therapy: Secondary | ICD-10-CM | POA: Diagnosis not present

## 2021-08-22 DIAGNOSIS — Z6837 Body mass index (BMI) 37.0-37.9, adult: Secondary | ICD-10-CM | POA: Diagnosis not present

## 2021-08-22 DIAGNOSIS — Z823 Family history of stroke: Secondary | ICD-10-CM | POA: Diagnosis not present

## 2021-08-22 DIAGNOSIS — I2510 Atherosclerotic heart disease of native coronary artery without angina pectoris: Secondary | ICD-10-CM | POA: Diagnosis present

## 2021-08-22 DIAGNOSIS — E669 Obesity, unspecified: Secondary | ICD-10-CM | POA: Diagnosis present

## 2021-08-22 DIAGNOSIS — L03116 Cellulitis of left lower limb: Secondary | ICD-10-CM | POA: Diagnosis present

## 2021-08-22 DIAGNOSIS — I517 Cardiomegaly: Secondary | ICD-10-CM

## 2021-08-22 DIAGNOSIS — I252 Old myocardial infarction: Secondary | ICD-10-CM | POA: Diagnosis not present

## 2021-08-22 DIAGNOSIS — I739 Peripheral vascular disease, unspecified: Secondary | ICD-10-CM | POA: Diagnosis not present

## 2021-08-22 DIAGNOSIS — I441 Atrioventricular block, second degree: Secondary | ICD-10-CM | POA: Diagnosis present

## 2021-08-22 DIAGNOSIS — Z7902 Long term (current) use of antithrombotics/antiplatelets: Secondary | ICD-10-CM | POA: Diagnosis not present

## 2021-08-22 DIAGNOSIS — Z9119 Patient's noncompliance with other medical treatment and regimen: Secondary | ICD-10-CM

## 2021-08-22 DIAGNOSIS — Z8249 Family history of ischemic heart disease and other diseases of the circulatory system: Secondary | ICD-10-CM | POA: Diagnosis not present

## 2021-08-22 DIAGNOSIS — R0602 Shortness of breath: Secondary | ICD-10-CM | POA: Diagnosis not present

## 2021-08-22 DIAGNOSIS — Z7982 Long term (current) use of aspirin: Secondary | ICD-10-CM | POA: Diagnosis not present

## 2021-08-22 DIAGNOSIS — F17210 Nicotine dependence, cigarettes, uncomplicated: Secondary | ICD-10-CM | POA: Diagnosis present

## 2021-08-22 DIAGNOSIS — J441 Chronic obstructive pulmonary disease with (acute) exacerbation: Secondary | ICD-10-CM | POA: Diagnosis not present

## 2021-08-22 DIAGNOSIS — Z9861 Coronary angioplasty status: Secondary | ICD-10-CM

## 2021-08-22 DIAGNOSIS — J9811 Atelectasis: Secondary | ICD-10-CM | POA: Diagnosis not present

## 2021-08-22 DIAGNOSIS — L97901 Non-pressure chronic ulcer of unspecified part of unspecified lower leg limited to breakdown of skin: Secondary | ICD-10-CM

## 2021-08-22 LAB — CBC
HCT: 46.2 % — ABNORMAL HIGH (ref 36.0–46.0)
Hemoglobin: 14.7 g/dL (ref 12.0–15.0)
MCH: 32.1 pg (ref 26.0–34.0)
MCHC: 31.8 g/dL (ref 30.0–36.0)
MCV: 100.9 fL — ABNORMAL HIGH (ref 80.0–100.0)
Platelets: 240 10*3/uL (ref 150–400)
RBC: 4.58 MIL/uL (ref 3.87–5.11)
RDW: 15.1 % (ref 11.5–15.5)
WBC: 6 10*3/uL (ref 4.0–10.5)
nRBC: 0 % (ref 0.0–0.2)

## 2021-08-22 LAB — BASIC METABOLIC PANEL
Anion gap: 10 (ref 5–15)
BUN: 24 mg/dL — ABNORMAL HIGH (ref 8–23)
CO2: 26 mmol/L (ref 22–32)
Calcium: 8.1 mg/dL — ABNORMAL LOW (ref 8.9–10.3)
Chloride: 105 mmol/L (ref 98–111)
Creatinine, Ser: 0.99 mg/dL (ref 0.44–1.00)
GFR, Estimated: 56 mL/min — ABNORMAL LOW (ref >60)
Glucose, Bld: 139 mg/dL — ABNORMAL HIGH (ref 70–99)
Potassium: 4 mmol/L (ref 3.5–5.1)
Sodium: 141 mmol/L (ref 135–145)

## 2021-08-22 LAB — TROPONIN I (HIGH SENSITIVITY)
Troponin I (High Sensitivity): 9 ng/L (ref <18)
Troponin I (High Sensitivity): 9 ng/L (ref <18)

## 2021-08-22 LAB — BRAIN NATRIURETIC PEPTIDE: B Natriuretic Peptide: 288.9 pg/mL — ABNORMAL HIGH (ref 0.0–100.0)

## 2021-08-22 LAB — SARS CORONAVIRUS 2 (TAT 6-24 HRS): SARS Coronavirus 2: NEGATIVE

## 2021-08-22 MED ORDER — IPRATROPIUM-ALBUTEROL 0.5-2.5 (3) MG/3ML IN SOLN
3.0000 mL | Freq: Once | RESPIRATORY_TRACT | Status: AC
  Administered 2021-08-22: 3 mL via RESPIRATORY_TRACT
  Filled 2021-08-22: qty 3

## 2021-08-22 MED ORDER — LATANOPROST 0.005 % OP SOLN
1.0000 [drp] | Freq: Every morning | OPHTHALMIC | Status: DC
  Administered 2021-08-23 – 2021-08-27 (×5): 1 [drp] via OPHTHALMIC
  Filled 2021-08-22 (×2): qty 2.5

## 2021-08-22 MED ORDER — GABAPENTIN 100 MG PO CAPS
200.0000 mg | ORAL_CAPSULE | Freq: Every day | ORAL | Status: DC
  Administered 2021-08-22 – 2021-08-26 (×5): 200 mg via ORAL
  Filled 2021-08-22 (×5): qty 2

## 2021-08-22 MED ORDER — GABAPENTIN 100 MG PO CAPS
100.0000 mg | ORAL_CAPSULE | Freq: Every morning | ORAL | Status: DC
  Administered 2021-08-23 – 2021-08-27 (×5): 100 mg via ORAL
  Filled 2021-08-22 (×5): qty 1

## 2021-08-22 MED ORDER — POLYETHYLENE GLYCOL 3350 17 G PO PACK: 17.0000 g | PACK | Freq: Every day | ORAL | Status: DC | PRN

## 2021-08-22 MED ORDER — ACETAMINOPHEN 500 MG PO TABS
1000.0000 mg | ORAL_TABLET | Freq: Once | ORAL | Status: AC
  Administered 2021-08-22: 1000 mg via ORAL
  Filled 2021-08-22: qty 2

## 2021-08-22 MED ORDER — DORZOLAMIDE HCL 2 % OP SOLN
1.0000 [drp] | Freq: Two times a day (BID) | OPHTHALMIC | Status: DC
  Administered 2021-08-22 – 2021-08-27 (×10): 1 [drp] via OPHTHALMIC
  Filled 2021-08-22 (×2): qty 10

## 2021-08-22 MED ORDER — ASPIRIN EC 81 MG PO TBEC
81.0000 mg | DELAYED_RELEASE_TABLET | Freq: Every day | ORAL | Status: DC
  Administered 2021-08-23 – 2021-08-27 (×5): 81 mg via ORAL
  Filled 2021-08-22 (×5): qty 1

## 2021-08-22 MED ORDER — UMECLIDINIUM-VILANTEROL 62.5-25 MCG/INH IN AEPB
1.0000 | INHALATION_SPRAY | RESPIRATORY_TRACT | Status: DC
  Administered 2021-08-23 – 2021-08-27 (×5): 1 via RESPIRATORY_TRACT
  Filled 2021-08-22 (×2): qty 14

## 2021-08-22 MED ORDER — ACETAMINOPHEN 325 MG PO TABS
650.0000 mg | ORAL_TABLET | Freq: Four times a day (QID) | ORAL | Status: DC | PRN
  Administered 2021-08-22 – 2021-08-24 (×7): 650 mg via ORAL
  Filled 2021-08-22 (×7): qty 2

## 2021-08-22 MED ORDER — SODIUM CHLORIDE 0.9 % IV SOLN
2.0000 g | INTRAVENOUS | Status: DC
  Filled 2021-08-22: qty 20

## 2021-08-22 MED ORDER — METOPROLOL SUCCINATE ER 25 MG PO TB24
25.0000 mg | ORAL_TABLET | Freq: Every day | ORAL | Status: DC
  Administered 2021-08-23 – 2021-08-27 (×5): 25 mg via ORAL
  Filled 2021-08-22 (×5): qty 1

## 2021-08-22 MED ORDER — SODIUM CHLORIDE 0.9 % IV SOLN
1.0000 g | Freq: Once | INTRAVENOUS | Status: AC
  Administered 2021-08-22: 1 g via INTRAVENOUS
  Filled 2021-08-22: qty 10

## 2021-08-22 MED ORDER — FAMOTIDINE 20 MG PO TABS
20.0000 mg | ORAL_TABLET | Freq: Two times a day (BID) | ORAL | Status: DC
  Administered 2021-08-22 – 2021-08-27 (×9): 20 mg via ORAL
  Filled 2021-08-22 (×10): qty 1

## 2021-08-22 MED ORDER — ROSUVASTATIN CALCIUM 10 MG PO TABS
20.0000 mg | ORAL_TABLET | Freq: Every day | ORAL | Status: DC
  Administered 2021-08-23 – 2021-08-27 (×5): 20 mg via ORAL
  Filled 2021-08-22 (×5): qty 2

## 2021-08-22 MED ORDER — MELATONIN 3 MG PO TABS
3.0000 mg | ORAL_TABLET | Freq: Every evening | ORAL | Status: DC | PRN
  Filled 2021-08-22: qty 1

## 2021-08-22 MED ORDER — ONDANSETRON HCL 4 MG/2ML IJ SOLN: 4.0000 mg | Freq: Four times a day (QID) | INTRAMUSCULAR | Status: DC | PRN

## 2021-08-22 MED ORDER — FUROSEMIDE 10 MG/ML IJ SOLN
20.0000 mg | Freq: Every day | INTRAMUSCULAR | Status: DC
  Administered 2021-08-22: 20 mg via INTRAVENOUS
  Filled 2021-08-22: qty 2

## 2021-08-22 MED ORDER — CLOPIDOGREL BISULFATE 75 MG PO TABS
75.0000 mg | ORAL_TABLET | Freq: Every day | ORAL | Status: DC
  Administered 2021-08-23 – 2021-08-27 (×5): 75 mg via ORAL
  Filled 2021-08-22 (×5): qty 1

## 2021-08-22 MED ORDER — IPRATROPIUM-ALBUTEROL 0.5-2.5 (3) MG/3ML IN SOLN
3.0000 mL | Freq: Four times a day (QID) | RESPIRATORY_TRACT | Status: DC
  Administered 2021-08-22 – 2021-08-23 (×4): 3 mL via RESPIRATORY_TRACT
  Filled 2021-08-22 (×4): qty 3

## 2021-08-22 MED ORDER — ENOXAPARIN SODIUM 60 MG/0.6ML IJ SOSY
0.5000 mg/kg | PREFILLED_SYRINGE | INTRAMUSCULAR | Status: DC
  Administered 2021-08-23 – 2021-08-26 (×4): 47.5 mg via SUBCUTANEOUS
  Filled 2021-08-22: qty 0.47
  Filled 2021-08-22: qty 0.6
  Filled 2021-08-22 (×3): qty 0.47
  Filled 2021-08-22: qty 0.6

## 2021-08-22 MED ORDER — GABAPENTIN 100 MG PO CAPS: 100.0000 mg | ORAL_CAPSULE | Freq: Two times a day (BID) | ORAL | Status: DC

## 2021-08-22 NOTE — ED Notes (Signed)
Informed RN bed assigned 

## 2021-08-22 NOTE — H&P (Addendum)
History and Physical  Jenny [name] NTI:[id number] DOB: 21-Jan-1933 DOA: 08/22/2021  Referring physician: Trisha Mangle, PA PCP: Kirby Funk, MD  Outpatient Specialists: Cardiology. Patient coming from: Home.  Chief Complaint: Lower extremities draining, pain, and swelling.  HPI: Jenny [name] is a 87 y.o. female with medical history significant for coronary artery disease, status post PCI with stenting, hyperlipidemia, hypertension, bilateral carotid artery stenosis, tobacco use disorder, Mobitz 1 AV block, history of inferior MI in 2010, with a drug-eluting stent in the right coronary artery, moderate COPD, who presented from home with complaints of bilateral lower extremity edema, weeping, and worsening erythema.  She initially went to see her primary care provider the day prior who recommended that she comes to the ED at [facility].  She went but she left because the wait was too long more than 6 hours.  She let her PCP know, who recommended she comes to [facility] ED since she was with her son who lives in [place].  She was brought in by her son to the ED.  Denies fevers at home.  Not on antibiotics prior to admission.  She has been using her inhaler frequently at home and has a productive cough with white mucus she thinks is from sinus drainage or allergies.  Ongoing tobacco user 1 pack/day, started at the age of 69.  ED Course: Temperature 97.6, BP 149/66, pulse 59, respiration rate 19, O2 saturation 100% on room air.  Creatinine 0.99 with GFR of 56 from creatinine of 0.82 with GFR of 70.  Hemoglobin 14.7.  Blood cultures obtained and pending.  Review of Systems: Review of systems as noted in the HPI. All other systems reviewed and are negative.   Past Medical History:  Diagnosis Date   ACUT DUOD ULCER W/HEMORR&PERF W/O MENTION OBST 10/05/2009   NSAID induced   ALLERGIC RHINITIS CAUSE UNSPECIFIED    ANEMIA-NOS    CAD (coronary artery disease) 06/2009   DEs RCA with 70% LAD and EF 60%   COPD     mild obst on PFTs 03/2010   Diabetes mellitus 06/2010 dx   Mild, diet controlled   GLAUCOMA    HYPERLIPIDEMIA    HYPERTENSION, BENIGN    MYOCARDIAL INFARCTION 06/2009   des to rca   TOBACCO ABUSE    Past Surgical History:  Procedure Laterality Date   HEMORRHOID SURGERY  1990   Right knee surgery      Social History:  reports that she has been smoking. She has a 30.00 pack-year smoking history. She has never used smokeless tobacco. She reports current alcohol use. She reports that she does not use drugs.   Allergies  Allergen Reactions   Atorvastatin Itching    Itching and myaliga   Meperidine Hcl Other (See Comments)    Not known   Pravastatin Rash    rash    Family History  Problem Relation Age of Onset   Hypertension Mother    Ulcers Mother    Stomach cancer Father    Stroke Maternal Grandmother    Heart attack Neg Hx       Prior to Admission medications   Medication Sig Start Date End Date Taking? Authorizing Provider  ANORO ELLIPTA 62.5-25 MCG/INH AEPB Inhale 1 puff into the lungs every morning. 09/12/17   [provider]  ASPIRIN ADULT PO Take 85 mg by mouth daily.    [provider]  clopidogrel (PLAVIX) 75 MG tablet TAKE ONE TABLET EVERY DAY 01/24/14   Rene Paci  A, MD  dorzolamide (TRUSOPT) 2 % ophthalmic solution 1 drop 2 (two) times daily.    [provider]  famotidine (PEPCID) 20 MG tablet Take 20 mg by mouth 2 (two) times daily.    [provider]  furosemide (LASIX) 40 MG tablet Take 40 mg by mouth as needed for fluid. 09/16/18   [provider]  gabapentin (NEURONTIN) 100 MG capsule Take 200 mg by mouth 2 (two) times daily. Pt takes one tablet every am, two tablets every pm    [provider]  latanoprost (XALATAN) 0.005 % ophthalmic solution Place 1 drop into both eyes at bedtime.    [provider]  losartan (COZAAR) 25 MG tablet Take 1 tablet (25 mg total) by mouth daily. 02/02/21    Kathleene Hazel, MD  magnesium hydroxide (MILK OF MAGNESIA) 400 MG/5ML suspension Take 30 mLs by mouth as needed for mild constipation.    [provider]  metoprolol succinate (TOPROL-XL) 50 MG 24 hr tablet Take 1 tablet (50 mg total) by mouth daily. Take with or immediately following a meal. 12/13/19   McAlhany, Nile Dear, MD  NITROSTAT 0.4 MG SL tablet DISSOLVE ONE TABLET UNDER TONGUE AS NEEDED FOR CHEST PAIN - MAY REPEAT TWICE-IF NO RELIEF GO TO NEAREST HOSPITAL ER 05/06/13   Newt Lukes, MD  potassium chloride SA (K-DUR,KLOR-CON) 20 MEQ tablet Take 10 mEq by mouth See admin instructions. Taking 1 tablet on same day if taking lasix 40mg     [provider]  rosuvastatin (CRESTOR) 20 MG tablet Take 20 mg by mouth daily.    [provider]  VENTOLIN HFA 108 (90 BASE) MCG/ACT inhaler INHALE ONE PUFF EVERY 6 HOURS AS NEEDED FOR SHORTNESS OF BREATH 02/23/14   02/25/14, MD    Physical Exam: BP (!) 149/66   Pulse (!) 59   Temp 97.6 F (36.4 C) (Axillary)   Resp 19   Ht 5\' 2"  (1.575 m)   Wt 95 kg   SpO2 100%   BMI 38.30 kg/m   General: 87 y.o. year-old female well developed well nourished in no acute distress.  Alert and oriented x3. Cardiovascular: Regular rate and rhythm with no rubs or gallops.  No thyromegaly or JVD noted.  Moderate lower extremity edema.  Respiratory: Mild rales at bases with no wheezing noted. Good inspiratory effort. Abdomen: Soft nontender nondistended with normal bowel sounds x4 quadrants. Muskuloskeletal: No cyanosis or clubbing.  Moderate edema noted in lower extremities bilaterally. Neuro: CN II-XII intact, strength, sensation, reflexes Skin:  Erythema, warmth, skin lesions with weeping in foul odor. Psychiatry: Judgement and insight appear normal. Mood is appropriate for condition and setting          Labs on Admission:  Basic Metabolic Panel: Recent Labs  Lab 08/22/21 1120  NA 141  K 4.0  CL 105   CO2 26  GLUCOSE 139*  BUN 24*  CREATININE 0.99  CALCIUM 8.1*   Liver Function Tests: No results for input(s): AST, ALT, ALKPHOS, BILITOT, PROT, ALBUMIN in the last 168 hours. No results for input(s): LIPASE, AMYLASE in the last 168 hours. No results for input(s): AMMONIA in the last 168 hours. CBC: Recent Labs  Lab 08/22/21 1120  WBC 6.0  HGB 14.7  HCT 46.2*  MCV 100.9*  PLT 240   Cardiac Enzymes: No results for input(s): CKTOTAL, CKMB, CKMBINDEX, TROPONINI in the last 168 hours.  BNP (last 3 results) Recent Labs    08/22/21 1120  BNP 288.9*    ProBNP (last 3 results) No results for input(s): PROBNP in the last 8760 hours.  CBG: No results for input(s): GLUCAP in the last 168 hours.  Radiological Exams on Admission: DG Chest 2 View  Result Date: 08/22/2021 CLINICAL DATA:  Shortness of breath EXAM: CHEST - 2 VIEW COMPARISON:  Chest radiograph 07/07/2009 FINDINGS: The heart is enlarged, progressed since 2010. There is calcified atherosclerotic plaque of the aortic arch. The mediastinal contours are within normal limits. There is vascular congestion without overt pulmonary edema. There is a trace right pleural effusion with adjacent atelectasis. Otherwise, there is no focal consolidation. There is no left pleural effusion. There is no pneumothorax. There is no acute osseous abnormality. IMPRESSION: 1. Cardiomegaly, progressed since 20/10, with vascular congestion but no overt pulmonary edema. 2. Trace right pleural effusion with adjacent atelectasis. Electronically Signed   By: Lesia Hausen M.D.   On: 08/22/2021 11:54      Assessment/Plan Present on Admission:  Cellulitis  Active Problems:   Cellulitis  Bilateral lower extremity chronic stasis dermatitis with wounds and cellulitis in the setting of PVD. She follows with cardiology outpatient Was previously seen by wound care clinic. She was started on IV antibiotics empirically in the ED, continue. Continue  Rocephin and local wound care. Wound care specialist consulted. Elevate lower extremity as needed to reduce the swelling Likely will benefit from vascular surgery evaluation outpatient. ABI ordered, follow results.  Concern for acute on chronic diastolic CHF Presented with shortness of breath, cardiomegaly with increased pulmonary vascularity on chest x-ray, elevated BNP, bilateral lower extremity edema. Last 2D echo was in 2020 after her inferior MI Obtain 2D echo Start low dose IV Lasix for now 20 mg x 3 days Monitor volume status, strict I&Os and daily weights Consult cardiology in the morning  Coronary artery disease status post PCI and stenting Denies any anginal symptoms Resume home aspirin, Plavix, statin and beta-blocker. Monitor on telemetry  Essential hypertension BP is not at goal, elevated Resume home cardiac medications Monitor vital signs  Hyperlipidemia Resume home statin  Carotid artery disease Mild bilateral carotid disease by Doppler in 2020 Resume home aspirin, Plavix and statin  History of Mobitz type I AV block. No complaints of dizziness She follows with cardiology outpatient.  Tobacco use disorder Ongoing tobacco use 1 pack/day, started smoking at the age of 39. She declines assistance with tobacco cessation. Tobacco cessation counseling done at bedside.  COPD Resume her home regimen Currently not hypoxic   DVT prophylaxis: Subcu Lovenox daily  Code Status: Full code  Family Communication: None at bedside  Disposition Plan: Admit to progressive cardiac unit  Consults called: None  Admission status: Inpatient status.  Patient will require at least 2 midnights for further evaluation and treatment of present condition.   Status is: Inpatient    Dispo: The patient is from: Home               Anticipated d/c is to: Home with home health services, wound care              Patient currently not medically stable   Difficult to place  patient, not applicable.       Darlin Drop MD [facility] Pager [phone]  If 7PM-7AM, please contact night-coverage [email or web address] Password [facility]  08/22/2021, 5:12 PM

## 2021-08-22 NOTE — ED Triage Notes (Signed)
Pt via POV from home. Pt c/o bilateral leg swelling and pain. Pt states that she has been having ongoing issues for the past couple of months but they started getting worse. Pt also c/o SOB. Bilateral legs are red and leaking fluids. Pt states that both legs are painful. Pt is A&OX4 and NAD. Denie hx of CHF.

## 2021-08-22 NOTE — ED Notes (Signed)
Cultures and Lactic sent with labs at this time.

## 2021-08-22 NOTE — ED Provider Notes (Signed)
[facility] Emergency Department Provider Note ____________________________________________   Event Date/Time   First MD Initiated Contact with Patient 08/22/21 1128     (approximate)  I have reviewed the triage vital signs and the nursing notes.   HISTORY  Chief Complaint Leg Swelling  HPI Jenny [name] is a 87 y.o. female with history of COPD, DM, PVD, and remaining history as listed below presents to the emergency department for treatment and evaluation for evaluation of shortness of breath, cough, and bilateral lower extremity pain, edema and weeping. Symptoms have progressively worsened over the week. She denies fever, chest pain, and  palpitations. She has not had these symptoms in the past. She has doubled her lasix without improvement in shortness of breath or lower extremity edema.       Past Medical History:  Diagnosis Date   ACUT DUOD ULCER W/HEMORR&PERF W/O MENTION OBST 10/05/2009   NSAID induced   ALLERGIC RHINITIS CAUSE UNSPECIFIED    ANEMIA-NOS    CAD (coronary artery disease) 06/2009   DEs RCA with 70% LAD and EF 60%   COPD    mild obst on PFTs 03/2010   Diabetes mellitus 06/2010 dx   Mild, diet controlled   GLAUCOMA    HYPERLIPIDEMIA    HYPERTENSION, BENIGN    MYOCARDIAL INFARCTION 06/2009   des to rca   TOBACCO ABUSE     Patient Active Problem List   Diagnosis Date Noted   Obese    Dyspepsia 04/05/2011   ALLERGIC RHINITIS CAUSE UNSPECIFIED 10/16/2010   COPD 05/21/2010   HYPERTENSION, BENIGN 04/06/2010   EDEMA 03/09/2010   ANEMIA-NOS 10/11/2009   GLAUCOMA 10/11/2009   CAD, NATIVE VESSEL 10/03/2009   HYPERLIPIDEMIA 07/12/2009   TOBACCO ABUSE 07/12/2009   MYOCARDIAL INFARCTION 07/12/2009   Type 2 diabetes, diet controlled (HCC) 07/12/2009    Past Surgical History:  Procedure Laterality Date   HEMORRHOID SURGERY  1990   Right knee surgery      Prior to Admission medications   Medication Sig Start Date End Date Taking?  Authorizing Provider  ANORO ELLIPTA 62.5-25 MCG/INH AEPB Inhale 1 puff into the lungs every morning. 09/12/17   [provider]  ASPIRIN ADULT PO Take 85 mg by mouth daily.    [provider]  clopidogrel (PLAVIX) 75 MG tablet TAKE ONE TABLET EVERY DAY 01/24/14   Newt Lukes, MD  dorzolamide (TRUSOPT) 2 % ophthalmic solution 1 drop 2 (two) times daily.    [provider]  famotidine (PEPCID) 20 MG tablet Take 20 mg by mouth 2 (two) times daily.    [provider]  furosemide (LASIX) 40 MG tablet Take 40 mg by mouth as needed for fluid. 09/16/18   [provider]  gabapentin (NEURONTIN) 100 MG capsule Take 200 mg by mouth 2 (two) times daily. Pt takes one tablet every am, two tablets every pm    [provider]  latanoprost (XALATAN) 0.005 % ophthalmic solution Place 1 drop into both eyes at bedtime.    [provider]  losartan (COZAAR) 25 MG tablet Take 1 tablet (25 mg total) by mouth daily. 02/02/21   Kathleene Hazel, MD  magnesium hydroxide (MILK OF MAGNESIA) 400 MG/5ML suspension Take 30 mLs by mouth as needed for mild constipation.    [provider]  metoprolol succinate (TOPROL-XL) 50 MG 24 hr tablet Take 1 tablet (50 mg total) by mouth daily. Take with or immediately following a meal. 12/13/19   McAlhany,  Nile Dear, MD  NITROSTAT 0.4 MG SL tablet DISSOLVE ONE TABLET UNDER TONGUE AS NEEDED FOR CHEST PAIN - MAY REPEAT TWICE-IF NO RELIEF GO TO NEAREST HOSPITAL ER 05/06/13   Newt Lukes, MD  potassium chloride SA (K-DUR,KLOR-CON) 20 MEQ tablet Take 10 mEq by mouth See admin instructions. Taking 1 tablet on same day if taking lasix 40mg     [provider]  rosuvastatin (CRESTOR) 20 MG tablet Take 20 mg by mouth daily.    [provider]  VENTOLIN HFA 108 (90 BASE) MCG/ACT inhaler INHALE ONE PUFF EVERY 6 HOURS AS NEEDED FOR SHORTNESS OF BREATH 02/23/14   02/25/14, MD     Allergies Atorvastatin, Meperidine hcl, and Pravastatin  Family History  Problem Relation Age of Onset   Hypertension Mother    Ulcers Mother    Stomach cancer Father    Stroke Maternal Grandmother    Heart attack Neg Hx     Social History Social History   Tobacco Use   Smoking status: Every Day    Packs/day: 0.50    Years: 60.00    Pack years: 30.00    Types: Cigarettes   Smokeless tobacco: Never   Tobacco comments:    She lives in [place] with her significant other ([place])0  Vaping Use   Vaping Use: Never used  Substance Use Topics   Alcohol use: Yes    Comment: Occassional   Drug use: No    Review of Systems  Constitutional: No fever/chills Eyes: No visual changes. ENT: No sore throat. Cardiovascular: Denies chest pain. Respiratory: Positive for  shortness of breath. Gastrointestinal: No abdominal pain.  No nausea, no vomiting.  No diarrhea.  No constipation. Genitourinary: Negative for dysuria. Musculoskeletal: Positive for bilateral lower extremity edema.. Skin: Negative for rash. Neurological: Negative for headaches, focal weakness or numbness. ____________________________________________   PHYSICAL EXAM:  VITAL SIGNS: ED Triage Vitals  Enc Vitals Group     BP 08/22/21 1113 (!) 157/98     Pulse Rate 08/22/21 1113 (!) 52     Resp 08/22/21 1113 18     Temp 08/22/21 1113 97.8 F (36.6 C)     Temp Source 08/22/21 1113 Oral     SpO2 08/22/21 1113 98 %     Weight 08/22/21 1115 207 lb (93.9 kg)     Height 08/22/21 1115 5\' 2"  (1.575 m)     Head Circumference --      Peak Flow --      Pain Score 08/22/21 1114 10     Pain Loc --      Pain Edu? --      Excl. in GC? --     Constitutional: Alert and oriented. Chronically ill appearing and in no acute distress. Eyes: Conjunctivae are normal. Head: Atraumatic. Nose: No congestion/rhinnorhea. Mouth/Throat: Mucous membranes are moist.  Oropharynx non-erythematous. Neck: No stridor.    Hematological/Lymphatic/Immunilogical: No cervical lymphadenopathy. Cardiovascular: Normal rate, regular rhythm. Grossly normal heart sounds.  Good peripheral circulation. Respiratory: Normal respiratory effort.  No retractions. Crackles and wheezing throughout on auscultation. Gastrointestinal: Soft and nontender. No distention. No abdominal bruits. Genitourinary:  Musculoskeletal: No lower extremity tenderness nor edema.  No joint effusions. Neurologic:  Normal speech and language. No gross focal neurologic deficits are appreciated. No gait instability. Skin:  Skin of bilateral lower extremities erythematous, sloughing, and weeping.      Psychiatric: Mood and affect are normal. Speech and behavior are normal.  ____________________________________________   LABS (all labs  ordered are listed, but only abnormal results are displayed)  Labs Reviewed  CBC - Abnormal; Notable for the following components:      Result Value   HCT 46.2 (*)    MCV 100.9 (*)    All other components within normal limits  BASIC METABOLIC PANEL - Abnormal; Notable for the following components:   Glucose, Bld 139 (*)    BUN 24 (*)    Calcium 8.1 (*)    GFR, Estimated 56 (*)    All other components within normal limits  BRAIN NATRIURETIC PEPTIDE - Abnormal; Notable for the following components:   B Natriuretic Peptide 288.9 (*)    All other components within normal limits  SARS CORONAVIRUS 2 (TAT 6-24 HRS)  TROPONIN I (HIGH SENSITIVITY)  TROPONIN I (HIGH SENSITIVITY)   ____________________________________________  EKG  Not indicated. ____________________________________________  RADIOLOGY  ED MD interpretation:    Chest x-ray shows cardiomegaly with vascular congestion and trace right pleural effusion.  I, Kem Boroughs, personally viewed and evaluated these images (plain radiographs) as part of my medical decision making, as well as reviewing the written report by the radiologist.  Official  radiology report(s): DG Chest 2 View  Result Date: 08/22/2021 CLINICAL DATA:  Shortness of breath EXAM: CHEST - 2 VIEW COMPARISON:  Chest radiograph 07/07/2009 FINDINGS: The heart is enlarged, progressed since 2010. There is calcified atherosclerotic plaque of the aortic arch. The mediastinal contours are within normal limits. There is vascular congestion without overt pulmonary edema. There is a trace right pleural effusion with adjacent atelectasis. Otherwise, there is no focal consolidation. There is no left pleural effusion. There is no pneumothorax. There is no acute osseous abnormality. IMPRESSION: 1. Cardiomegaly, progressed since 20/10, with vascular congestion but no overt pulmonary edema. 2. Trace right pleural effusion with adjacent atelectasis. Electronically Signed   By: Lesia Hausen M.D.   On: 08/22/2021 11:54    ____________________________________________   PROCEDURES  Procedure(s) performed (including Critical Care):  Procedures  ____________________________________________   INITIAL IMPRESSION / ASSESSMENT AND PLAN     87 year old female presenting to the emergency department for treatment and evaluation shortness of breath and bilateral lower extremity edema and weeping.  See HPI for further details.  Patient states that she was evaluated by her primary care provider yesterday and advised to go to the emergency department "for admission."  She states that she arrived to [facility] in [place] and was advised that she would need to be evaluated through the emergency department and the wait time was more than 6 hours.  She states that this was not something she was willing to do and went home.  She again spoke with her primary care provider who encouraged her to proceed to the emergency department for evaluation and likely admission.  DIFFERENTIAL DIAGNOSIS  CHF, peripheral vascular disease, COPD exacerbation  ED COURSE  Chart review shows that she was under wound care  for similar issues with her lower extremities back in April 2021.  She was diagnosed with bilateral chronic venous insufficiency and stasis dermatitis.  At that time her ABI was 0.78.  She was discharged from wound care after wounds were closed.  She was advised at that time to use Cetaphil and compression stockings.  She states that the wounds and the erythema at that time were not as bad as they are today.  ----------------------------------------- 2:01 PM on 08/22/2021 -----------------------------------------  Labs are overall reassuring. Chest x-ray showing new cardiomegaly. BNP elevated without history of CHF.  Will admit for IV antibiotics, new onset cardiomegaly, and vascular evaluation.    ___________________________________________   FINAL CLINICAL IMPRESSION(S) / ED DIAGNOSES  Final diagnoses:  Cellulitis of lower extremity, unspecified laterality  Cardiomegaly  Peripheral vascular disease [facility])     ED Discharge Orders     None        Jenny [name] was evaluated in Emergency Department on 08/22/2021 for the symptoms described in the history of present illness. She was evaluated in the context of the global COVID-19 pandemic, which necessitated consideration that the patient might be at risk for infection with the SARS-CoV-2 virus that causes COVID-19. Institutional protocols and algorithms that pertain to the evaluation of patients at risk for COVID-19 are in a state of rapid change based on information released by regulatory bodies including the CDC and federal and state organizations. These policies and algorithms were followed during the patient's care in the ED.   Note:  This document was prepared using Dragon voice recognition software and may include unintentional dictation errors.    Chinita Pester, FNP 08/22/21 1423    Arnaldo Natal, MD 08/22/21 1626

## 2021-08-23 ENCOUNTER — Inpatient Hospital Stay: Payer: Medicare Other

## 2021-08-23 DIAGNOSIS — F17200 Nicotine dependence, unspecified, uncomplicated: Secondary | ICD-10-CM

## 2021-08-23 DIAGNOSIS — I2510 Atherosclerotic heart disease of native coronary artery without angina pectoris: Secondary | ICD-10-CM

## 2021-08-23 DIAGNOSIS — J441 Chronic obstructive pulmonary disease with (acute) exacerbation: Secondary | ICD-10-CM | POA: Diagnosis not present

## 2021-08-23 DIAGNOSIS — I5033 Acute on chronic diastolic (congestive) heart failure: Secondary | ICD-10-CM

## 2021-08-23 DIAGNOSIS — I739 Peripheral vascular disease, unspecified: Secondary | ICD-10-CM

## 2021-08-23 DIAGNOSIS — I272 Pulmonary hypertension, unspecified: Secondary | ICD-10-CM

## 2021-08-23 DIAGNOSIS — I878 Other specified disorders of veins: Secondary | ICD-10-CM

## 2021-08-23 DIAGNOSIS — L03119 Cellulitis of unspecified part of limb: Secondary | ICD-10-CM | POA: Diagnosis not present

## 2021-08-23 LAB — COMPREHENSIVE METABOLIC PANEL
ALT: 24 U/L (ref 0–44)
AST: 20 U/L (ref 15–41)
Albumin: 3.1 g/dL — ABNORMAL LOW (ref 3.5–5.0)
Alkaline Phosphatase: 105 U/L (ref 38–126)
Anion gap: 10 (ref 5–15)
BUN: 23 mg/dL (ref 8–23)
CO2: 24 mmol/L (ref 22–32)
Calcium: 8.2 mg/dL — ABNORMAL LOW (ref 8.9–10.3)
Chloride: 104 mmol/L (ref 98–111)
Creatinine, Ser: 0.9 mg/dL (ref 0.44–1.00)
GFR, Estimated: >60 mL/min (ref >60)
Glucose, Bld: 114 mg/dL — ABNORMAL HIGH (ref 70–99)
Potassium: 4.3 mmol/L (ref 3.5–5.1)
Sodium: 138 mmol/L (ref 135–145)
Total Bilirubin: 0.7 mg/dL (ref 0.3–1.2)
Total Protein: 6.5 g/dL (ref 6.5–8.1)

## 2021-08-23 LAB — CBC
HCT: 42.9 % (ref 36.0–46.0)
Hemoglobin: 14 g/dL (ref 12.0–15.0)
MCH: 32.3 pg (ref 26.0–34.0)
MCHC: 32.6 g/dL (ref 30.0–36.0)
MCV: 99.1 fL (ref 80.0–100.0)
Platelets: 263 10*3/uL (ref 150–400)
RBC: 4.33 MIL/uL (ref 3.87–5.11)
RDW: 15.1 % (ref 11.5–15.5)
WBC: 6.9 10*3/uL (ref 4.0–10.5)
nRBC: 0 % (ref 0.0–0.2)

## 2021-08-23 LAB — ECHOCARDIOGRAM COMPLETE
Area-P 1/2: 2.13 cm2
Height: 62 in
MV VTI: 1.14 cm2
S' Lateral: 2.21 cm
Weight: 3350.4 oz

## 2021-08-23 LAB — MAGNESIUM: Magnesium: 2.2 mg/dL (ref 1.7–2.4)

## 2021-08-23 MED ORDER — FUROSEMIDE 10 MG/ML IJ SOLN
40.0000 mg | Freq: Every day | INTRAMUSCULAR | Status: DC
  Administered 2021-08-23: 40 mg via INTRAVENOUS
  Filled 2021-08-23: qty 4

## 2021-08-23 MED ORDER — LOSARTAN POTASSIUM 50 MG PO TABS
50.0000 mg | ORAL_TABLET | Freq: Every day | ORAL | Status: DC
  Administered 2021-08-23 – 2021-08-27 (×5): 50 mg via ORAL
  Filled 2021-08-23 (×5): qty 1

## 2021-08-23 MED ORDER — ASCORBIC ACID 500 MG PO TABS
250.0000 mg | ORAL_TABLET | Freq: Two times a day (BID) | ORAL | Status: DC
  Administered 2021-08-23 – 2021-08-27 (×8): 250 mg via ORAL
  Filled 2021-08-23 (×8): qty 1

## 2021-08-23 MED ORDER — ENSURE MAX PROTEIN PO LIQD
11.0000 [oz_av] | Freq: Two times a day (BID) | ORAL | Status: DC
Start: 2021-08-23 — End: 2021-08-27
  Administered 2021-08-23 – 2021-08-26 (×6): 11 [oz_av] via ORAL
  Filled 2021-08-23: qty 330

## 2021-08-23 MED ORDER — OCUVITE-LUTEIN PO CAPS
1.0000 | ORAL_CAPSULE | Freq: Every day | ORAL | Status: DC
  Administered 2021-08-24 – 2021-08-27 (×4): 1 via ORAL
  Filled 2021-08-23 (×5): qty 1

## 2021-08-23 MED ORDER — CEFAZOLIN SODIUM-DEXTROSE 1-4 GM/50ML-% IV SOLN
1.0000 g | Freq: Three times a day (TID) | INTRAVENOUS | Status: DC
  Administered 2021-08-23 – 2021-08-27 (×12): 1 g via INTRAVENOUS
  Filled 2021-08-23 (×19): qty 50

## 2021-08-23 MED ORDER — IPRATROPIUM-ALBUTEROL 0.5-2.5 (3) MG/3ML IN SOLN
3.0000 mL | RESPIRATORY_TRACT | Status: DC | PRN
  Administered 2021-08-24 – 2021-08-26 (×4): 3 mL via RESPIRATORY_TRACT
  Filled 2021-08-23 (×4): qty 3

## 2021-08-23 NOTE — Progress Notes (Signed)
Initial Nutrition Assessment  DOCUMENTATION CODES:   Obesity unspecified  INTERVENTION:   Ensure Max protein supplement BID, each supplement provides 150kcal and 30g of protein.  Ocuvite daily for wound healing (provides zinc, vitamin A, vitamin C, Vitamin E, copper, and selenium)  Vitamin C $RemoveB'250mg'CsvFvnAV$  po BID   NUTRITION DIAGNOSIS:   Increased nutrient needs related to wound healing as evidenced by estimated needs.  GOAL:   Patient will meet greater than or equal to 90% of their needs  MONITOR:   PO intake, Supplement acceptance, Labs, Weight trends, Skin, I & O's  REASON FOR ASSESSMENT:   Malnutrition Screening Tool    ASSESSMENT:   87 y.o. female with medical history significant for coronary artery disease status post PCI with stenting, hyperlipidemia, hypertension, bilateral carotid artery stenosis, tobacco use disorder, Mobitz 1 AV block, inferior MI in 2010 with a drug-eluting stent in the right coronary artery, moderate COPD, DM and CHF who presented from home with complaints of bilateral lower extremity edema, weeping, and worsening erythema.  Met with pt and family in room today. Pt reports good appetite and oral intake pta and in hospital; pt eating 100% of meals. Per chart, pt appears weight stable at baseline. RD discussed with pt the importance of adequate nutrition needed to preserve lean muscle and to support wound healing. Pt is reluctant to drink supplements as she reports that they taste terrible and she is lactose intolerant but she does agree to try Ensure Max protein in hospital which is lactose free. RD also discussed with pt the importance of vitamin supplementation needed for wound healing. Pt is a smoker and will need additional vitamin C to support wound healing. RD will add supplements and vitamins to help pt meet her estimated needs.   Medications reviewed and include: aspirin, plavix, lovenox, pepcid, lasix, ceftriaxone   Labs reviewed: K 4.3 wnl, Mg 2.2  wnl  NUTRITION - FOCUSED PHYSICAL EXAM:  Flowsheet Row Most Recent Value  Orbital Region No depletion  Upper Arm Region No depletion  Thoracic and Lumbar Region No depletion  Buccal Region No depletion  Temple Region No depletion  Clavicle Bone Region No depletion  Clavicle and Acromion Bone Region No depletion  Scapular Bone Region No depletion  Dorsal Hand No depletion  Patellar Region No depletion  Anterior Thigh Region No depletion  Posterior Calf Region No depletion  Edema (RD Assessment) Moderate  Hair Reviewed  Eyes Reviewed  Mouth Reviewed  Skin Reviewed  Nails Reviewed   Diet Order:   Diet Order             Diet Heart Room service appropriate? Yes; Fluid consistency: Thin  Diet effective now                  EDUCATION NEEDS:   Education needs have been addressed  Skin:  Skin Assessment: Reviewed RN Assessment (BLE venous stasis with weeping and ulcers calves, ecchymosis)  Last BM:  9/14  Height:   Ht Readings from Last 1 Encounters:  08/22/21 $RemoveB'5\' 2"'[id number]$  (1.575 m)    Weight:   Wt Readings from Last 1 Encounters:  08/23/21 93.1 kg    Ideal Body Weight:  50 kg  BMI:  Body mass index is 37.55 kg/m.  Estimated Nutritional Needs:   Kcal:  1800-2100kcal/day  Protein:  90-105g/day  Fluid:  1.3-1.5L/day  Koleen Distance MS, RD, LDN Please refer to [facility] for RD and/or RD on-call/weekend/after hours pager

## 2021-08-23 NOTE — Progress Notes (Signed)
PROGRESS NOTE  Jenny [name] QAS:[id number] DOB: 05/31/1933   PCP: Kirby Funk, MD  Patient is from: Home.  DOA: 08/22/2021 LOS: 1  Chief complaints:  Chief Complaint  Patient presents with   Leg Swelling     Brief Narrative / Interim history: 87 year old F with PMH of CAD/DES stent in 2010, COPD, chronic venous insufficiency, bilateral carotid artery stenosis, tobacco use disorder, HTN, HLD and Mobitz 1 AVB presenting with bilateral lower extremity edema and erythema, and admitted for possible lower extremity cellulitis in the setting of chronic venous stasis, and possible acute on chronic diastolic CHF.  Afebrile, hemodynamically stable.  No significant finding on basic labs.  BNP elevated to 289.  Started on IV ceftriaxone and IV Lasix.  TTE and ABI ordered.  TTE with LVEF of 60 to 65%, indeterminate DD, RVSP of 65 mmHg, moderate RAE.  Left ABI 0.7 consistent with at least moderate underlying PAD.  Right ABI 1.0 but felt to be spurious and falsely elevated due to poor compressibility.   Subjective: Seen and examined earlier this morning.  No major events overnight of this morning.  Reports improvement in her breathing.  She also reports with productive cough on presentation.  Denies chest pain, GI or UTI symptoms.  Objective: Vitals:   08/23/21 0746 08/23/21 0850 08/23/21 1134 08/23/21 1353  BP: 131/80  (!) 138/57   Pulse: 60  60   Resp: 17  18   Temp: 98.3 F (36.8 C)  98.1 F (36.7 C)   TempSrc: Oral  Oral   SpO2: 96% 96% 98% 97%  Weight:      Height:        Intake/Output Summary (Last 24 hours) at 08/23/2021 1356 Last data filed at 08/23/2021 1134 Gross per 24 hour  Intake 1175.72 ml  Output 2200 ml  Net -1024.28 ml   Filed Weights   08/22/21 1115 08/22/21 1611 08/23/21 0500  Weight: 93.9 kg 95 kg 93.1 kg    Examination:  GENERAL: No apparent distress.  Nontoxic. HEENT: MMM.  Vision and hearing grossly intact.  NECK: Supple.  No apparent JVD.  RESP: On RA.   No IWOB.  Fair aeration bilaterally.  Some end expiratory wheeze. CVS:  RRR. Heart sounds normal.  ABD/GI/GU: BS+. Abd soft, NTND.  MSK/EXT:  Moves extremities. No apparent deformity. No edema.  Faint DP pulses bilaterally. SKIN: Chronic skin changes in BLE consistent with stasis dermatitis.  Some erythema bilaterally NEURO: Sleepy but wakes to voice easily.  Oriented x4.  No apparent focal neuro deficit. PSYCH: Calm. Normal affect.   Procedures:  None  Microbiology summarized: COVID-19 and influenza PCR nonreactive. Blood cultures NGTD.  Assessment & Plan: Bilateral lower extremity chronic stasis dermatitis with wounds  Possible cellulitis in the setting of the above. PAD-Left ABI 0.7.  Right ABI 1.0 but spurious and falsely elevated due to poor compressibility.  -De-escalate antibiotics to IV Ancef -Appreciate help by wound care nurse -Elevate lower extremities -Already on aspirin, Plavix and statin. -Encouraged tobacco cessation-but she is not ready or willing to try meds -Outpatient follow-up with vascular surgery  Possible COPD exacerbation-patient reports shortness of breath, wheezing and cough on presentation.  Symptoms of end expiratory wheeze on exam but breathing has improved.  Ongoing tobacco smoker but a pack a day. -Continue Anoro Ellipta with as needed DuoNeb -Antibiotics as above. -Hold off steroid for now given improvement in his symptoms without a steroid   Acute on chronic diastolic CHF/PAH: TTE with LVEF of 60  to 65%, indeterminate DD and RVSP of 65 mmHg.  CXR with cardiomegaly and vascular congestion.  BNP slightly elevated.  1+ edema bilaterally but in the setting of venous insufficiency.  -Increase IV Lasix to 40 mg daily -Monitor fluid status, renal functions and electrolytes -Sodium and fluid restriction  History of CAD/DES stent in 2010-denies chest pain. -Continue Toprol-XL, aspirin, Plavix and statin  Essential hypertension: BP within fair  range. -Continue home medication   Hyperlipidemia -Continue home Crestor.   Carotid artery disease: Mild bilateral carotid disease by Doppler in 2020 -Continue home aspirin, Plavix and statin   History of Mobitz type I AV block: EKG this admission seems to be A. fib with slow ventricular response. -Repeat EKG   Tobacco use disorder-1 pack/day, started smoking at the age of 49. -Now ready for tobacco cessation.  She declined meds as well.    Class II obesity Body mass index is 37.55 kg/m. Nutrition Problem: Increased nutrient needs Etiology: wound healing Signs/Symptoms: estimated needs     DVT prophylaxis:    On subcu Lovenox  Code Status: Full code Family Communication: Updated patient's son over the phone. Level of care: Progressive Cardiac.  Change to MedSurg with telemetry. Status is: Inpatient  Remains inpatient appropriate because:Ongoing diagnostic testing needed not appropriate for outpatient work up, IV treatments appropriate due to intensity of illness or inability to take PO, and Inpatient level of care appropriate due to severity of illness  Dispo:  Patient From: Home  Planned Disposition: Home with Health Care Svc  Medically stable for discharge: No         Consultants:  None   Sch Meds:  Scheduled Meds:  vitamin C  250 mg Oral BID   aspirin EC  81 mg Oral Daily   clopidogrel  75 mg Oral Daily   dorzolamide  1 drop Both Eyes BID   enoxaparin (LOVENOX) injection  0.5 mg/kg Subcutaneous Q24H   famotidine  20 mg Oral BID   furosemide  40 mg Intravenous Daily   gabapentin  100 mg Oral q morning   gabapentin  200 mg Oral QHS   ipratropium-albuterol  3 mL Nebulization Q6H   latanoprost  1 drop Both Eyes q AM   metoprolol succinate  25 mg Oral Daily   [START ON 08/24/2021] multivitamin-lutein  1 capsule Oral Daily   Ensure Max Protein  11 oz Oral BID   rosuvastatin  20 mg Oral Daily   umeclidinium-vilanterol  1 puff Inhalation BH-q7a    Continuous Infusions:  cefTRIAXone (ROCEPHIN)  IV     PRN Meds:.acetaminophen, melatonin, ondansetron (ZOFRAN) IV, polyethylene glycol  Antimicrobials: Anti-infectives (From admission, onward)    Start     Dose/Rate Route Frequency Ordered Stop   08/23/21 1400  cefTRIAXone (ROCEPHIN) 2 g in sodium chloride 0.9 % 100 mL IVPB        2 g 200 mL/hr over 30 Minutes Intravenous Every 24 hours 08/22/21 1510     08/22/21 1330  cefTRIAXone (ROCEPHIN) 1 g in sodium chloride 0.9 % 100 mL IVPB        1 g 200 mL/hr over 30 Minutes Intravenous  Once 08/22/21 1323 08/22/21 1454        I have personally reviewed the following labs and images: CBC: Recent Labs  Lab 08/22/21 1120 08/23/21 0627  WBC 6.0 6.9  HGB 14.7 14.0  HCT 46.2* 42.9  MCV 100.9* 99.1  PLT 240 263   BMP &GFR Recent Labs  Lab 08/22/21 1120  08/23/21 0627  NA 141 138  K 4.0 4.3  CL 105 104  CO2 26 24  GLUCOSE 139* 114*  BUN 24* 23  CREATININE 0.99 0.90  CALCIUM 8.1* 8.2*  MG  --  2.2   Estimated Creatinine Clearance: 48.6 mL/min (by C-G formula based on SCr of 0.9 mg/dL). Liver & Pancreas: Recent Labs  Lab 08/23/21 0627  AST 20  ALT 24  ALKPHOS 105  BILITOT 0.7  PROT 6.5  ALBUMIN 3.1*   No results for input(s): LIPASE, AMYLASE in the last 168 hours. No results for input(s): AMMONIA in the last 168 hours. Diabetic: No results for input(s): HGBA1C in the last 72 hours. No results for input(s): GLUCAP in the last 168 hours. Cardiac Enzymes: No results for input(s): CKTOTAL, CKMB, CKMBINDEX, TROPONINI in the last 168 hours. No results for input(s): PROBNP in the last 8760 hours. Coagulation Profile: No results for input(s): INR, PROTIME in the last 168 hours. Thyroid Function Tests: No results for input(s): TSH, T4TOTAL, FREET4, T3FREE, THYROIDAB in the last 72 hours. Lipid Profile: No results for input(s): CHOL, HDL, LDLCALC, TRIG, CHOLHDL, LDLDIRECT in the last 72 hours. Anemia Panel: No  results for input(s): VITAMINB12, FOLATE, FERRITIN, TIBC, IRON, RETICCTPCT in the last 72 hours. Urine analysis: No results found for: COLORURINE, APPEARANCEUR, LABSPEC, PHURINE, GLUCOSEU, HGBUR, BILIRUBINUR, KETONESUR, PROTEINUR, UROBILINOGEN, NITRITE, LEUKOCYTESUR Sepsis Labs: Invalid input(s): PROCALCITONIN, LACTICIDVEN  Microbiology: Recent Results (from the past 240 hour(s))  CULTURE, BLOOD (ROUTINE X 2) w Reflex to ID Panel     Status: None (Preliminary result)   Collection Time: 08/22/21 11:20 AM   Specimen: BLOOD  Result Value Ref Range Status   Specimen Description BLOOD RIGHT ANTECUBITAL  Final   Special Requests   Final    BOTTLES DRAWN AEROBIC AND ANAEROBIC Blood Culture results may not be optimal due to an excessive volume of blood received in culture bottles   Culture   Final    NO GROWTH < 24 HOURS Performed at [facility], [address] [place], [place] [address]    Report Status PENDING  Incomplete  SARS CORONAVIRUS 2 (TAT 6-24 HRS) Nasopharyngeal Nasopharyngeal Swab     Status: None   Collection Time: 08/22/21 12:24 PM   Specimen: Nasopharyngeal Swab  Result Value Ref Range Status   SARS Coronavirus 2 NEGATIVE NEGATIVE Final    Comment: (NOTE) SARS-CoV-2 target nucleic acids are NOT DETECTED.  The SARS-CoV-2 RNA is generally detectable in upper and lower respiratory specimens during the acute phase of infection. Negative results do not preclude SARS-CoV-2 infection, do not rule out co-infections with other pathogens, and should not be used as the sole basis for treatment or other patient management decisions. Negative results must be combined with clinical observations, patient history, and epidemiological information. The expected result is Negative.  Fact Sheet for Patients: HairSlick.no  Fact Sheet for Healthcare Providers: quierodirigir.com  This test is not yet approved or cleared by  the [place] FDA and  has been authorized for detection and/or diagnosis of SARS-CoV-2 by FDA under an Emergency Use Authorization (EUA). This EUA will remain  in effect (meaning this test can be used) for the duration of the COVID-19 declaration under Se ction 564(b)(1) of the Act, 21 U.S.C. section 360bbb-3(b)(1), unless the authorization is terminated or revoked sooner.  Performed at [facility], [address], [place], [place] [address]   CULTURE, BLOOD (ROUTINE X 2) w Reflex to ID Panel  Status: None (Preliminary result)   Collection Time: 08/22/21  4:21 PM   Specimen: BLOOD  Result Value Ref Range Status   Specimen Description BLOOD RIGHT ANTECUBITAL  Final   Special Requests   Final    BOTTLES DRAWN AEROBIC AND ANAEROBIC Blood Culture adequate volume   Culture   Final    NO GROWTH < 24 HOURS Performed at [facility], [address] [place], [place] [address]    Report Status PENDING  Incomplete    Radiology Studies: US ARTERIAL ABI (SCREENING LOWER EXTREMITY)  Result Date: 08/23/2021 CLINICAL DATA:  Peripheral vascular disease EXAM: NONINVASIVE PHYSIOLOGIC VASCULAR STUDY OF BILATERAL LOWER EXTREMITIES TECHNIQUE: Evaluation of both lower extremities were performed at rest, including calculation of ankle-brachial indices with single level Doppler, pressure and pulse volume recording. COMPARISON:  None. FINDINGS: Right ABI:  1.0 Left ABI:  0.7 Right Lower Extremity:  Abnormal monophasic arterial waveforms. Left Lower Extremity:  Abnormal monophasic arterial waveforms. 0.5-0.79 Moderate PAD IMPRESSION: 1. Resting left ankle-brachial index of 0.7 consistent with at least moderate underlying peripheral arterial disease. 2. Resting right ankle-brachial index of 1.0 is normal. However, this finding is felt to be spurious and falsely elevated due to poor compressibility of the vessels given the abnormal underlying arterial waveforms. Signed, Sterling Big,  MD, RPVI [facility] Electronically Signed   By: Malachy Moan M.D.   On: 08/23/2021 09:59   ECHOCARDIOGRAM COMPLETE  Result Date: 08/23/2021    ECHOCARDIOGRAM REPORT   Patient Name:   Jenny [name] Date of Exam: 08/22/2021 Medical Rec #:  [id number]     Height:       62.0 in Accession #:    [id number]    Weight:       209.0 lb Date of Birth:  [birth year]     BSA:          1.948 m Patient Age:    87 years      BP:           131/80 mmHg Patient Gender: F             HR:           60 bpm. Exam Location:  [facility] Procedure: 2D Echo, Cardiac Doppler and Color Doppler Indications:     CHF-acute diastolic I50.31  History:         Patient has no prior history of Echocardiogram examinations.                  Previous Myocardial Infarction; Risk Factors:Diabetes and                  Hypertension. Tobacco abuse.  Sonographer:     Kynadie Yaun RDCS Referring Phys:  [id number] Oliver Pila HALL Diagnosing Phys: Julien Nordmann MD IMPRESSIONS  1. Left ventricular ejection fraction, by estimation, is 60 to 65%. The left ventricle has normal function. The left ventricle has no regional wall motion abnormalities. There is mild left ventricular hypertrophy. Left ventricular diastolic parameters are indeterminate.  2. Right ventricular systolic function is normal. The right ventricular size is normal. There is severely elevated pulmonary artery systolic pressure. The estimated right ventricular systolic pressure is 65.1 mmHg.  3. Left atrial size was mildly dilated.  4. Right atrial size was moderately dilated.  5. The mitral valve is normal in structure. Mild mitral valve regurgitation. Mild mitral stenosis.  6. The inferior vena cava is dilated in size with <50%  respiratory variability, suggesting right atrial pressure of 15 mmHg. FINDINGS  Left Ventricle: Left ventricular ejection fraction, by estimation, is 60 to 65%. The left ventricle has normal function. The left  ventricle has no regional wall motion abnormalities. The left ventricular internal cavity size was normal in size. There is  mild left ventricular hypertrophy. Left ventricular diastolic parameters are indeterminate. Right Ventricle: The right ventricular size is normal. No increase in right ventricular wall thickness. Right ventricular systolic function is normal. There is severely elevated pulmonary artery systolic pressure. The tricuspid regurgitant velocity is 3.54 m/s, and with an assumed right atrial pressure of 15 mmHg, the estimated right ventricular systolic pressure is 65.1 mmHg. Left Atrium: Left atrial size was mildly dilated. Right Atrium: Right atrial size was moderately dilated. Pericardium: There is no evidence of pericardial effusion. Mitral Valve: The mitral valve is normal in structure. Mild mitral annular calcification. Mild mitral valve regurgitation. Mild mitral valve stenosis. MV peak gradient, 19.1 mmHg. The mean mitral valve gradient is 8.0 mmHg. Tricuspid Valve: The tricuspid valve is normal in structure. Tricuspid valve regurgitation is mild . No evidence of tricuspid stenosis. Aortic Valve: The aortic valve is normal in structure. Aortic valve regurgitation is not visualized. Mild aortic valve sclerosis is present, with no evidence of aortic valve stenosis. Pulmonic Valve: The pulmonic valve was normal in structure. Pulmonic valve regurgitation is not visualized. No evidence of pulmonic stenosis. Aorta: The aortic root is normal in size and structure. Venous: The inferior vena cava is dilated in size with less than 50% respiratory variability, suggesting right atrial pressure of 15 mmHg. IAS/Shunts: No atrial level shunt detected by color flow Doppler.  LEFT VENTRICLE PLAX 2D LVIDd:         4.14 cm  Diastology LVIDs:         2.21 cm  LV e' medial:    9.90 cm/s LV PW:         1.23 cm  LV E/e' medial:  20.5 LV IVS:        1.19 cm  LV e' lateral:   8.49 cm/s LVOT diam:     1.70 cm  LV E/e'  lateral: 23.9 LV SV:         67 LV SV Index:   35 LVOT Area:     2.27 cm  RIGHT VENTRICLE             IVC RV Basal diam:  4.19 cm     IVC diam: 2.39 cm RV S prime:     10.65 cm/s TAPSE (M-mode): 2.2 cm LEFT ATRIUM             Index       RIGHT ATRIUM           Index LA diam:        4.50 cm 2.31 cm/m  RA Area:     27.60 cm LA Vol (A2C):   65.1 ml 33.43 ml/m RA Volume:   103.00 ml 52.89 ml/m LA Vol (A4C):   63.8 ml 32.76 ml/m LA Biplane Vol: 65.8 ml 33.78 ml/m  AORTIC VALVE LVOT Vmax:   150.00 cm/s LVOT Vmean:  107.000 cm/s LVOT VTI:    0.297 m  AORTA Ao Root diam: 3.60 cm Ao Asc diam:  3.20 cm MITRAL VALVE                TRICUSPID VALVE MV Area (PHT): 2.13 cm     TR Peak grad:   50.1 mmHg  MV Area VTI:   1.14 cm     TR Vmax:        354.00 cm/s MV Peak grad:  19.1 mmHg MV Mean grad:  8.0 mmHg     SHUNTS MV Vmax:       2.18 m/s     Systemic VTI:  0.30 m MV Vmean:      126.0 cm/s   Systemic Diam: 1.70 cm MV Decel Time: 356 msec MV E velocity: 203.00 cm/s MV A velocity: 81.50 cm/s MV E/A ratio:  2.49 Julien Nordmann MD Electronically signed by Julien Nordmann MD Signature Date/Time: 08/23/2021/10:31:28 AM    Final        Fumiko Cham T. Latresha Yahr [facility]  If 7PM-7AM, please contact night-coverage [email or web address] 08/23/2021, 1:56 PM

## 2021-08-23 NOTE — Plan of Care (Signed)
  Problem: Clinical Measurements: Goal: Ability to maintain clinical measurements within normal limits will improve Outcome: Progressing   Problem: Clinical Measurements: Goal: Will remain free from infection Outcome: Progressing   Problem: Clinical Measurements: Goal: Diagnostic test results will improve Outcome: Progressing   

## 2021-08-23 NOTE — Consult Note (Signed)
WOC Nurse Consult Note: Patient receiving care in [facility]. Reason for Consult: BLE venous stasis with weeping and ulcers Wound type: venous stasis to BLE calves Pressure Injury POA: Yes/No/NA Measurement: Wound bed: see photos Drainage (amount, consistency, odor) heavy drainage and crusting Periwound: dry, hemosiderin staining Dressing procedure/placement/frequency: Generously wash BLE with warm, soapy water. Pat dry. Apply Sween Moisturizing ointment (pink and white tube in clean utility) to intact skin. Place as many Aquacel dressings [place] [phone]) over the open, weeping areas of the legs as needed to cover the sites, then ABD pads. Beginning behind the toes and going to just below the knees, spiral wrap kerlix and 4 inch ace wraps. Change daily. Moisten the Aquacel, if needed, to remove the old aquacel when changing the dressings.  Awaiting results of ABI to determine if patient is appropriate for compression therapy.  Monitor the wound area(s) for worsening of condition such as: Signs/symptoms of infection,  Increase in size,  Development of or worsening of odor, Development of pain, or increased pain at the affected locations.  Notify the medical team if any of these develop.  Thank you for the consult.  WOC nurse will not follow at this time.  Please re-consult the WOC team if needed.  Helmut Muster, RN, MSN, CWOCN, CNS-BC, pager [phone]

## 2021-08-23 NOTE — Evaluation (Signed)
Occupational Therapy Evaluation Patient Details Name: Jenny [name] MRN: [id number] DOB: 1933-10-29 Today's Date: 08/23/2021   History of Present Illness 87 y.o. female with medical history significant for coronary artery disease, status post PCI with stenting, hyperlipidemia, hypertension, bilateral carotid artery stenosis, tobacco use disorder, Mobitz 1 AV block, history of inferior MI in 2010, with a drug-eluting stent in the right coronary artery, moderate COPD, who presented from home with complaints of bilateral lower extremity edema, weeping, and worsening erythema.  She initially went to see her primary care provider the day prior who recommended that she come to the ED.   Clinical Impression   Jenny [name] was seen for OT evaluation this date. Prior to hospital admission, pt was MOD I for I/ADLs and mobility using RW for community distances. Pt lives alone with family available PRN. Pt presents to acute OT demonstrating near baseline ADL performance with no strength/ROM deficits noted. Pt requires SUPERVISION for ADL t/f - pt requires cues for safety (furniture walking noted, pt attempting to stand from unlocked recliner stating "I just push it back until it hits the wall before standing.") MOD I for LB access seated in chair - increased time 2/2 BLE pain.  Pt educated in energy conservation strategies including pursed lip breathing, activity pacing, home/routines modifications, work simplification, AE/DME, prioritizing of meaningful occupations, and falls prevention. Upon hospital discharge, anticipate no OT follow up needs.      Recommendations for follow up therapy are one component of a multi-disciplinary discharge planning process, led by the attending physician.  Recommendations may be updated based on patient status, additional functional criteria and insurance authorization.   Follow Up Recommendations  No OT follow up    Equipment Recommendations  None recommended by OT     Recommendations for Other Services       Precautions / Restrictions Precautions Precautions: Fall Restrictions Weight Bearing Restrictions: No      Mobility Bed Mobility               General bed mobility comments: in recliner on arrival, not tested    Transfers Overall transfer level: Needs assistance Equipment used: None Transfers: Sit to/from Stand Sit to Stand: Supervision         General transfer comment: requires UE support to stand    Balance Overall balance assessment: Needs assistance   Sitting balance-Leahy Scale: Normal     Standing balance support: No upper extremity supported Standing balance-Leahy Scale: Good Standing balance comment: single UE support for synamic tasks                           ADL either performed or assessed with clinical judgement   ADL Overall ADL's : Needs assistance/impaired                                       General ADL Comments: SUPERVISION for ADL t/f - pt requires cues for safety (furniture walking noted, pt attempting to stand from unlocked recliner stating "I just push it back until it hits the wall before standing.") MOD I for LB access seated in chair - increased time 2/2 BLE pain      Pertinent Vitals/Pain Pain Assessment: Faces Pain Score: 6  (reports continued pressure/sorness in b/l LEs that has improved since arrival) Faces Pain Scale: Hurts little more Pain Location: BLE Pain Descriptors / Indicators:  Discomfort;Grimacing Pain Intervention(s): Limited activity within patient's tolerance     Hand Dominance     Extremity/Trunk Assessment Upper Extremity Assessment Upper Extremity Assessment: Overall WFL for tasks assessed   Lower Extremity Assessment Lower Extremity Assessment: Overall WFL for tasks assessed       Communication Communication Communication: No difficulties   Cognition Arousal/Alertness: Awake/alert Behavior During Therapy: WFL for tasks  assessed/performed Overall Cognitive Status: Within Functional Limits for tasks assessed                                     General Comments       Exercises Exercises: Other exercises Other Exercises Other Exercises: Pt educated re: OT role, DME recs, d/c recs, falls prevention, ECS Other Exercises: LBD, sit<>stand, sitting/standing balance/tolerance, ~40 ft mobility   Shoulder Instructions      Home Living Family/patient expects to be discharged to:: Private residence Living Arrangements: Alone Available Help at Discharge: Family Type of Home: House Home Access: Ramped entrance     Home Layout: One level               Home Equipment: Walker - 4 wheels          Prior Functioning/Environment Level of Independence: Independent        Comments: Pt drives and runs her errands, family does check in (call or phyiscally) daily.  She reports no falls in the last 6 months.        OT Problem List: Decreased activity tolerance;Decreased safety awareness      OT Treatment/Interventions:      OT Goals(Current goals can be found in the care plan section) Acute Rehab OT Goals Patient Stated Goal: go home OT Goal Formulation: With patient Time For Goal Achievement: 09/06/21 Potential to Achieve Goals: Good  OT Frequency:     Barriers to D/C:            Co-evaluation              AM-PAC OT "6 Clicks" Daily Activity     Outcome Measure Help from another person eating meals?: None Help from another person taking care of personal grooming?: None Help from another person toileting, which includes using toliet, bedpan, or urinal?: A Little Help from another person bathing (including washing, rinsing, drying)?: A Little Help from another person to put on and taking off regular upper body clothing?: None Help from another person to put on and taking off regular lower body clothing?: None 6 Click Score: 22   End of Session    Activity  Tolerance: Patient tolerated treatment well Patient left: in chair;with call bell/phone within reach  OT Visit Diagnosis: Other abnormalities of gait and mobility (R26.89)                Time: 6045-4098 OT Time Calculation (min): 12 min Charges:  OT General Charges $OT Visit: 1 Visit OT Evaluation $OT Eval Low Complexity: 1 Low  Jenny [name], M.S. OTR/L  08/23/21, 3:56 PM  ascom [phone]

## 2021-08-23 NOTE — Evaluation (Signed)
Physical Therapy Evaluation Patient Details Name: Jenny [name] MRN: [id number] DOB: May 30, 1933 Today's Date: 08/23/2021  History of Present Illness  87 y.o. female with medical history significant for coronary artery disease, status post PCI with stenting, hyperlipidemia, hypertension, bilateral carotid artery stenosis, tobacco use disorder, Mobitz 1 AV block, history of inferior MI in 2010, with a drug-eluting stent in the right coronary artery, moderate COPD, who presented from home with complaints of bilateral lower extremity edema, weeping, and worsening erythema.  She initially went to see her primary care provider the day prior who recommended that she come to the ED.  Clinical Impression  Pt confident with getting up to standing and starting long walk around nurses' station.  She did have increasing fatigue with increased distance (~250 ft total with walker) but was able to maintain appropriate speed and apart from leaning more and more forward on the walker did not have any overt safety issues.  PT offered/encouraged the idea of HHPT at discharge and she showed little interest.  Family present and indicates that they will be able to run her community errands for a while until she is back to a more active baseline.      Recommendations for follow up therapy are one component of a multi-disciplinary discharge planning process, led by the attending physician.  Recommendations may be updated based on patient status, additional functional criteria and insurance authorization.  Follow Up Recommendations Home health PT (Pt not overly interested, will at least maintain on caseload while admitted to maximize activity)    Equipment Recommendations  None recommended by PT    Recommendations for Other Services       Precautions / Restrictions Precautions Precautions: Fall Restrictions Weight Bearing Restrictions: No      Mobility  Bed Mobility               General bed mobility  comments: in recliner on arrival, not tested    Transfers Overall transfer level: Modified independent Equipment used: Rolling walker (2 wheeled)             General transfer comment: Pt able to rise and maintain balance w/o AD, good confidence/no hesitation  Ambulation/Gait Ambulation/Gait assistance: Supervision Gait Distance (Feet): 250 Feet Assistive device: Rolling walker (2 wheeled)       General Gait Details: Pt with reliance on the walker and did have increasing fatigue/DOE with stable and appropriate vitals despite significant subjective fatigue.  Stairs            Wheelchair Mobility    Modified Rankin (Stroke Patients Only)       Balance Overall balance assessment: Needs assistance   Sitting balance-Leahy Scale: Normal     Standing balance support: No upper extremity supported Standing balance-Leahy Scale: Good Standing balance comment: needed walker during dynamic tasks, statically maitnained balance w/o UEs                             Pertinent Vitals/Pain Pain Assessment: 0-10 Pain Score: 6  (reports continued pressure/sorness in b/l LEs that has improved since arrival)    Home Living Family/patient expects to be discharged to:: Private residence Living Arrangements: Alone Available Help at Discharge: Family   Home Access: Ramped entrance     Home Layout: One level Home Equipment: Walker - 4 wheels      Prior Function Level of Independence: Independent         Comments: Pt drives and runs  her errands, family does check in (call or phyiscally) daily.  She reports no falls in the last 6 months.     Hand Dominance        Extremity/Trunk Assessment   Upper Extremity Assessment Upper Extremity Assessment: Overall WFL for tasks assessed;Generalized weakness (age appropraite defecits)    Lower Extremity Assessment Lower Extremity Assessment: Overall WFL for tasks assessed;Generalized weakness (age appropriate  defecits)       Communication   Communication: No difficulties  Cognition Arousal/Alertness: Awake/alert Behavior During Therapy: WFL for tasks assessed/performed Overall Cognitive Status: Within Functional Limits for tasks assessed                                        General Comments      Exercises     Assessment/Plan    PT Assessment Patient needs continued PT services  PT Problem List Decreased range of motion;Decreased activity tolerance;Decreased balance;Decreased mobility;Decreased safety awareness       PT Treatment Interventions DME instruction;Gait training;Functional mobility training;Therapeutic activities;Balance training;Therapeutic exercise;Neuromuscular re-education;Patient/family education    PT Goals (Current goals can be found in the Care Plan section)  Acute Rehab PT Goals Patient Stated Goal: go home PT Goal Formulation: With patient Time For Goal Achievement: 09/06/21 Potential to Achieve Goals: Good    Frequency Min 2X/week   Barriers to discharge        Co-evaluation               AM-PAC PT "6 Clicks" Mobility  Outcome Measure Help needed turning from your back to your side while in a flat bed without using bedrails?: None Help needed moving from lying on your back to sitting on the side of a flat bed without using bedrails?: None Help needed moving to and from a bed to a chair (including a wheelchair)?: None Help needed standing up from a chair using your arms (e.g., wheelchair or bedside chair)?: None Help needed to walk in hospital room?: A Little Help needed climbing 3-5 steps with a railing? : A Little 6 Click Score: 22    End of Session Equipment Utilized During Treatment: Gait belt Activity Tolerance: Patient tolerated treatment well Patient left: in chair;with call bell/phone within reach;with family/visitor present Nurse Communication: Mobility status PT Visit Diagnosis: Muscle weakness (generalized)  (M62.81);Difficulty in walking, not elsewhere classified (R26.2)    Time: 1400-1420 PT Time Calculation (min) (ACUTE ONLY): 20 min   Charges:   PT Evaluation $PT Eval Low Complexity: 1 Low PT Treatments $Gait Training: 8-22 mins        Malachi Pro, DPT 08/23/2021, 3:32 PM

## 2021-08-24 DIAGNOSIS — J441 Chronic obstructive pulmonary disease with (acute) exacerbation: Secondary | ICD-10-CM | POA: Diagnosis not present

## 2021-08-24 DIAGNOSIS — I2510 Atherosclerotic heart disease of native coronary artery without angina pectoris: Secondary | ICD-10-CM | POA: Diagnosis not present

## 2021-08-24 DIAGNOSIS — F17200 Nicotine dependence, unspecified, uncomplicated: Secondary | ICD-10-CM | POA: Diagnosis not present

## 2021-08-24 DIAGNOSIS — L03119 Cellulitis of unspecified part of limb: Secondary | ICD-10-CM | POA: Diagnosis not present

## 2021-08-24 LAB — RENAL FUNCTION PANEL
Albumin: 3 g/dL — ABNORMAL LOW (ref 3.5–5.0)
Anion gap: 7 (ref 5–15)
BUN: 31 mg/dL — ABNORMAL HIGH (ref 8–23)
CO2: 25 mmol/L (ref 22–32)
Calcium: 8.3 mg/dL — ABNORMAL LOW (ref 8.9–10.3)
Chloride: 104 mmol/L (ref 98–111)
Creatinine, Ser: 0.98 mg/dL (ref 0.44–1.00)
GFR, Estimated: 57 mL/min — ABNORMAL LOW (ref >60)
Glucose, Bld: 114 mg/dL — ABNORMAL HIGH (ref 70–99)
Phosphorus: 4 mg/dL (ref 2.5–4.6)
Potassium: 3.9 mmol/L (ref 3.5–5.1)
Sodium: 136 mmol/L (ref 135–145)

## 2021-08-24 LAB — CBC
HCT: 42.2 % (ref 36.0–46.0)
Hemoglobin: 13.9 g/dL (ref 12.0–15.0)
MCH: 31.7 pg (ref 26.0–34.0)
MCHC: 32.9 g/dL (ref 30.0–36.0)
MCV: 96.3 fL (ref 80.0–100.0)
Platelets: 265 10*3/uL (ref 150–400)
RBC: 4.38 MIL/uL (ref 3.87–5.11)
RDW: 15 % (ref 11.5–15.5)
WBC: 6.2 10*3/uL (ref 4.0–10.5)
nRBC: 0 % (ref 0.0–0.2)

## 2021-08-24 LAB — MAGNESIUM: Magnesium: 2.1 mg/dL (ref 1.7–2.4)

## 2021-08-24 MED ORDER — NYSTATIN 100000 UNIT/GM EX POWD
Freq: Two times a day (BID) | CUTANEOUS | Status: DC
  Filled 2021-08-24 (×2): qty 15

## 2021-08-24 MED ORDER — FLUCONAZOLE 50 MG PO TABS
150.0000 mg | ORAL_TABLET | Freq: Once | ORAL | Status: AC
  Administered 2021-08-24: 150 mg via ORAL
  Filled 2021-08-24: qty 1

## 2021-08-24 MED ORDER — ACETAMINOPHEN-CODEINE #3 300-30 MG PO TABS
1.0000 | ORAL_TABLET | Freq: Four times a day (QID) | ORAL | Status: DC | PRN
  Administered 2021-08-24 – 2021-08-27 (×5): 1 via ORAL
  Filled 2021-08-24 (×6): qty 1

## 2021-08-24 MED ORDER — OXYCODONE HCL 5 MG PO TABS: 5.0000 mg | ORAL_TABLET | Freq: Four times a day (QID) | ORAL | Status: DC | PRN

## 2021-08-24 MED ORDER — FUROSEMIDE 40 MG PO TABS
40.0000 mg | ORAL_TABLET | Freq: Every day | ORAL | Status: DC
  Administered 2021-08-24 – 2021-08-27 (×4): 40 mg via ORAL
  Filled 2021-08-24 (×4): qty 1

## 2021-08-24 NOTE — Progress Notes (Signed)
Bilateral LE dressings changed. Both calves are very malodorous with slough and purulent drainage. Patient c/o burning sensation at time. Patient states she hasn't had "feeling" in her lower legs for years.

## 2021-08-24 NOTE — Care Management Important Message (Signed)
Important Message  Patient Details  Name: Jenny [name] MRN: [id number] Date of Birth: [birth year]   Medicare Important Message Given:  Yes     [name] 08/24/2021, 2:48 PM

## 2021-08-24 NOTE — TOC Initial Note (Signed)
Transition of Care [facility]) - Initial/Assessment Note    Patient Details  Name: Jenny [name] MRN: [id number] Date of Birth: 05/14/1933  Transition of Care [facility]) CM/SW Contact:    Candie Chroman, LCSW Phone Number: 08/24/2021, 1:10 PM  Clinical Narrative:   CSW met with patient. No supports at bedside. CSW introduced role and explained that PT recommendation would be discussed. Patient did not give definitive yes or no to home health but will review CMS scores for agencies that serve her zip code. She lives in [place]. Patient lives alone but confirmed her son that lives in [place] and daughter-in-law that lives "on the [place]" can assist with errands. Patient has a rollator and shower seat at home. MD mentioned concerns about daily wound care. Explained that her wound care needs to not require SNF placement. Patient confirmed she is not interested in SNF placement. Will ask TOC staff member to follow up tomorrow regarding home health decision. Will likely just need PT and RN. No further concerns. CSW encouraged patient to contact CSW as needed. CSW will continue to follow patient for support and facilitate return home at discharge.               Expected Discharge Plan: [place] Barriers to Discharge: Continued Medical Work up   Patient Goals and CMS Choice   CMS Medicare.gov Compare Post Acute Care list provided to:: Patient    Expected Discharge Plan and Services Expected Discharge Plan: [place] Acute Care Choice:  (TBD) Living arrangements for the past 2 months: Single Family Home                                      Prior Living Arrangements/Services Living arrangements for the past 2 months: Single Family Home Lives with:: Self Patient language and need for interpreter reviewed:: Yes Do you feel safe going back to the place where you live?: Yes      Need for Family Participation in Patient Care: Yes  (Comment)   Current home services: DME Criminal Activity/Legal Involvement Pertinent to Current Situation/Hospitalization: No - Comment as needed  Activities of Daily Living Home Assistive Devices/Equipment: None ADL Screening (condition at time of admission) Patient's cognitive ability adequate to safely complete daily activities?: Yes Is the patient deaf or have difficulty hearing?: No Does the patient have difficulty seeing, even when wearing glasses/contacts?: No Does the patient have difficulty concentrating, remembering, or making decisions?: No Patient able to express need for assistance with ADLs?: Yes Does the patient have difficulty dressing or bathing?: No Independently performs ADLs?: Yes (appropriate for developmental age) Does the patient have difficulty walking or climbing stairs?: Yes Weakness of Legs: Both Weakness of Arms/Hands: None  Permission Sought/Granted                  Emotional Assessment Appearance:: Appears stated age Attitude/Demeanor/Rapport: Engaged, Gracious Affect (typically observed): Accepting, Appropriate, Calm, Pleasant Orientation: : Oriented to Self, Oriented to Place, Oriented to  Time, Oriented to Situation Alcohol / Substance Use: Not Applicable Psych Involvement: No (comment)  Admission diagnosis:  Cardiomegaly [I51.7] Cellulitis [L03.90] Peripheral vascular disease ([place]) [I73.9] Cellulitis of lower extremity, unspecified laterality [Z00.923] Patient Active Problem List   Diagnosis Date Noted   Cellulitis 08/22/2021   Obese    Dyspepsia 04/05/2011   ALLERGIC RHINITIS CAUSE UNSPECIFIED 10/16/2010  COPD 05/21/2010   HYPERTENSION, BENIGN 04/06/2010   EDEMA 03/09/2010   ANEMIA-NOS 10/11/2009   GLAUCOMA 10/11/2009   CAD, NATIVE VESSEL 10/03/2009   HYPERLIPIDEMIA 07/12/2009   TOBACCO ABUSE 07/12/2009   MYOCARDIAL INFARCTION 07/12/2009   Type 2 diabetes, diet controlled ([place]) 07/12/2009   PCP:  Lavone Orn, MD Pharmacy:    [facility], [address] [place] [address] Phone: [phone] Fax: [phone]  [facility] [place], [place] - [address] [place] [address] Phone: [phone] Fax: [phone]  [facility] [place], [place] - [address] [place] [address] Phone: [phone] Fax: [phone]     Social Determinants of Health (SDOH) Interventions    Readmission Risk Interventions No flowsheet data found.

## 2021-08-24 NOTE — Progress Notes (Signed)
PROGRESS NOTE  Jenny [name] ZHY:[id number] DOB: 06-23-1933   PCP: Kirby Funk, MD  Patient is from: Home.  DOA: 08/22/2021 LOS: 2  Chief complaints:  Chief Complaint  Patient presents with   Leg Swelling     Brief Narrative / Interim history: 87 year old F with PMH of CAD/DES stent in 2010, COPD, chronic venous insufficiency, bilateral carotid artery stenosis, tobacco use disorder, HTN, HLD and Mobitz 1 AVB presenting with bilateral lower extremity edema and erythema, and admitted for possible lower extremity cellulitis in the setting of chronic venous stasis, and possible acute on chronic diastolic CHF.  Afebrile, hemodynamically stable.  No significant finding on basic labs.  BNP elevated to 289.  Started on IV ceftriaxone and IV Lasix.  TTE and ABI ordered.  TTE with LVEF of 60 to 65%, indeterminate DD, RVSP of 65 mmHg, moderate RAE.  Left ABI 0.7 consistent with at least moderate underlying PAD.  Right ABI 1.0 but felt to be spurious and falsely elevated due to poor compressibility.  Patient is already on aspirin, Plavix and Crestor.   Patient is on IV Ancef.  Remains in-house due to the need for daily dressing.   Subjective: Seen and examined earlier this morning.  No major events overnight of this morning.  No complaints.  Reports feeling well from a cardiopulmonary standpoint.  She denies shortness of breath, chest pain, GI or UTI symptoms.  She thinks her leg is getting better.  She just had a dressing change earlier this morning.  Patient's son at bedside, and not in agreement with patient.  He thinks patient should be in the hospital over the weekend since he will not be in town to help with dressing or supervise.  Objective: Vitals:   08/24/21 0429 08/24/21 0435 08/24/21 0829 08/24/21 1146  BP:  (!) 152/57 (!) 87/72 (!) 116/41  Pulse:  70 72 (!) 56  Resp:  (!) 21 19 19   Temp:  98 F (36.7 C)  98 F (36.7 C)  TempSrc:  Oral  Oral  SpO2:  96% 97% 96%  Weight: 93.3 kg      Height:        Intake/Output Summary (Last 24 hours) at 08/24/2021 1704 Last data filed at 08/24/2021 1330 Gross per 24 hour  Intake 789.92 ml  Output 0 ml  Net 789.92 ml   Filed Weights   08/22/21 1611 08/23/21 0500 08/24/21 0429  Weight: 95 kg 93.1 kg 93.3 kg    Examination:  GENERAL: No apparent distress.  Nontoxic. HEENT: MMM.  Vision and hearing grossly intact.  NECK: Supple.  No apparent JVD.  RESP: 96% on RA.  No IWOB.  Fair aeration bilaterally. CVS:  RRR. Heart sounds normal.  ABD/GI/GU: BS+. Abd soft, NTND.  MSK/EXT:  Moves extremities.  Dressing over BLE DCI. SKIN: Dressing over BLE DCI.  No erythema or swelling proximally. NEURO: Awake and alert. Oriented appropriately.  No apparent focal neuro deficit. PSYCH: Calm. Normal affect.   Procedures:  None  Microbiology summarized: COVID-19 and influenza PCR nonreactive. Blood cultures NGTD.  Assessment & Plan: Bilateral lower extremity chronic stasis dermatitis with wounds  Possible cellulitis in the setting of the above. PAD-Left ABI 0.7.  Right ABI 1.0 but spurious and falsely elevated due to poor compressibility.  -De-escalateD antibiotics to IV Ancef on 9/15 -WOCN recommended daily dressing change -Elevate lower extremities -Already on aspirin, Plavix and statin. -Encouraged tobacco cessation-but she is very reluctant about this -Outpatient follow-up with vascular surgery  Possible  COPD exacerbation-presented with shortness of breath, wheezing and cough.  Likely due to ongoing cigarette smoking.  Respiratory symptoms improved -Continue Anoro Ellipta with as needed DuoNeb -Antibiotics as above. -Hold off steroid for now given improvement in his symptoms without a steroid   Acute on chronic diastolic CHF/PAH: TTE with LVEF of 60 to 65%, indeterminate DD and RVSP of 65 mmHg.  CXR with cardiomegaly and vascular congestion.  BNP slightly elevated.  Started on IV Lasix.  Had 1.6 L UOP charted from  yesterday. -Change to p.o. Lasix 40 mg daily -Monitor fluid status, renal functions and electrolytes -Sodium and fluid restriction  History of CAD/DES stent in 2010-denies chest pain. -Continue Toprol-XL, aspirin, Plavix and statin  Essential hypertension: BP within fair range. -Continue home medication   Hyperlipidemia -Continue home Crestor.   Carotid artery disease: Mild bilateral carotid disease by Doppler in 2020 -Continue home aspirin, Plavix and statin   History of Mobitz type I AV block: EKG with A. fib.  Persistent atrial fibrillation: Rate controlled.  Not on anticoagulation -Continue home Toprol-XL as above   Tobacco use disorder-1 pack/day, started smoking at the age of 49. -Now ready for tobacco cessation.  She declined meds as well.    Class II obesity Body mass index is 37.6 kg/m. Nutrition Problem: Increased nutrient needs Etiology: wound healing Signs/Symptoms: estimated needs     DVT prophylaxis:    On subcu Lovenox  Code Status: Full code Family Communication: Updated patient's son at bedside Level of care: Med-Surg.   Status is: Inpatient  Remains inpatient appropriate because: Due to need for daily wound dressing change  Dispo:  Patient From: Home  Planned Disposition: To be determined  Medically stable for discharge: No         Consultants:  None   Sch Meds:  Scheduled Meds:  vitamin C  250 mg Oral BID   aspirin EC  81 mg Oral Daily   clopidogrel  75 mg Oral Daily   dorzolamide  1 drop Both Eyes BID   enoxaparin (LOVENOX) injection  0.5 mg/kg Subcutaneous Q24H   famotidine  20 mg Oral BID   furosemide  40 mg Oral Daily   gabapentin  100 mg Oral q morning   gabapentin  200 mg Oral QHS   latanoprost  1 drop Both Eyes q AM   losartan  50 mg Oral Daily   metoprolol succinate  25 mg Oral Daily   multivitamin-lutein  1 capsule Oral Daily   Ensure Max Protein  11 oz Oral BID   rosuvastatin  20 mg Oral Daily    umeclidinium-vilanterol  1 puff Inhalation BH-[id number]   Continuous Infusions:   ceFAZolin (ANCEF) IV 1 g (08/24/21 1432)   PRN Meds:.acetaminophen, ipratropium-albuterol, melatonin, ondansetron (ZOFRAN) IV, polyethylene glycol  Antimicrobials: Anti-infectives (From admission, onward)    Start     Dose/Rate Route Frequency Ordered Stop   08/23/21 1515  ceFAZolin (ANCEF) IVPB 1 g/50 mL premix        1 g 100 mL/hr over 30 Minutes Intravenous Every 8 hours 08/23/21 1417 08/30/21 1359   08/23/21 1400  cefTRIAXone (ROCEPHIN) 2 g in sodium chloride 0.9 % 100 mL IVPB  Status:  Discontinued        2 g 200 mL/hr over 30 Minutes Intravenous Every 24 hours 08/22/21 1510 08/23/21 1417   08/22/21 1330  cefTRIAXone (ROCEPHIN) 1 g in sodium chloride 0.9 % 100 mL IVPB        1 g 200  mL/hr over 30 Minutes Intravenous  Once 08/22/21 1323 08/22/21 1454        I have personally reviewed the following labs and images: CBC: Recent Labs  Lab 08/22/21 1120 08/23/21 0627 08/24/21 0439  WBC 6.0 6.9 6.2  HGB 14.7 14.0 13.9  HCT 46.2* 42.9 42.2  MCV 100.9* 99.1 96.3  PLT 240 263 265   BMP &GFR Recent Labs  Lab 08/22/21 1120 08/23/21 0627 08/24/21 0439  NA 141 138 136  K 4.0 4.3 3.9  CL 105 104 104  CO2 26 24 25   GLUCOSE 139* 114* 114*  BUN 24* 23 31*  CREATININE 0.99 0.90 0.98  CALCIUM 8.1* 8.2* 8.3*  MG  --  2.2 2.1  PHOS  --   --  4.0   Estimated Creatinine Clearance: 44.7 mL/min (by C-G formula based on SCr of 0.98 mg/dL). Liver & Pancreas: Recent Labs  Lab 08/23/21 0627 08/24/21 0439  AST 20  --   ALT 24  --   ALKPHOS 105  --   BILITOT 0.7  --   PROT 6.5  --   ALBUMIN 3.1* 3.0*   No results for input(s): LIPASE, AMYLASE in the last 168 hours. No results for input(s): AMMONIA in the last 168 hours. Diabetic: No results for input(s): HGBA1C in the last 72 hours. No results for input(s): GLUCAP in the last 168 hours. Cardiac Enzymes: No results for input(s): CKTOTAL, CKMB,  CKMBINDEX, TROPONINI in the last 168 hours. No results for input(s): PROBNP in the last 8760 hours. Coagulation Profile: No results for input(s): INR, PROTIME in the last 168 hours. Thyroid Function Tests: No results for input(s): TSH, T4TOTAL, FREET4, T3FREE, THYROIDAB in the last 72 hours. Lipid Profile: No results for input(s): CHOL, HDL, LDLCALC, TRIG, CHOLHDL, LDLDIRECT in the last 72 hours. Anemia Panel: No results for input(s): VITAMINB12, FOLATE, FERRITIN, TIBC, IRON, RETICCTPCT in the last 72 hours. Urine analysis: No results found for: COLORURINE, APPEARANCEUR, LABSPEC, PHURINE, GLUCOSEU, HGBUR, BILIRUBINUR, KETONESUR, PROTEINUR, UROBILINOGEN, NITRITE, LEUKOCYTESUR Sepsis Labs: Invalid input(s): PROCALCITONIN, LACTICIDVEN  Microbiology: Recent Results (from the past 240 hour(s))  CULTURE, BLOOD (ROUTINE X 2) w Reflex to ID Panel     Status: None (Preliminary result)   Collection Time: 08/22/21 11:20 AM   Specimen: BLOOD  Result Value Ref Range Status   Specimen Description BLOOD RIGHT ANTECUBITAL  Final   Special Requests   Final    BOTTLES DRAWN AEROBIC AND ANAEROBIC Blood Culture results may not be optimal due to an excessive volume of blood received in culture bottles   Culture   Final    NO GROWTH 2 DAYS Performed at [facility], [address] [place], [place]    Report Status PENDING  Incomplete  SARS CORONAVIRUS 2 (TAT 6-24 HRS) Nasopharyngeal Nasopharyngeal Swab     Status: None   Collection Time: 08/22/21 12:24 PM   Specimen: Nasopharyngeal Swab  Result Value Ref Range Status   SARS Coronavirus 2 NEGATIVE NEGATIVE Final    Comment: (NOTE) SARS-CoV-2 target nucleic acids are NOT DETECTED.  The SARS-CoV-2 RNA is generally detectable in upper and lower respiratory specimens during the acute phase of infection. Negative results do not preclude SARS-CoV-2 infection, do not rule out co-infections with other pathogens, and should not be used  as the sole basis for treatment or other patient management decisions. Negative results must be combined with clinical observations, patient history, and epidemiological information. The expected result is Negative.  Fact Sheet for Patients:  HairSlick.no  Fact Sheet for Healthcare Providers: quierodirigir.com  This test is not yet approved or cleared by the [place] FDA and  has been authorized for detection and/or diagnosis of SARS-CoV-2 by FDA under an Emergency Use Authorization (EUA). This EUA will remain  in effect (meaning this test can be used) for the duration of the COVID-19 declaration under Se ction 564(b)(1) of the Act, 21 U.S.C. section 360bbb-3(b)(1), unless the authorization is terminated or revoked sooner.  Performed at [facility], [address], [place], [place] [address]   CULTURE, BLOOD (ROUTINE X 2) w Reflex to ID Panel     Status: None (Preliminary result)   Collection Time: 08/22/21  4:21 PM   Specimen: BLOOD  Result Value Ref Range Status   Specimen Description BLOOD RIGHT ANTECUBITAL  Final   Special Requests   Final    BOTTLES DRAWN AEROBIC AND ANAEROBIC Blood Culture adequate volume   Culture   Final    NO GROWTH 2 DAYS Performed at [facility], [address], [place], [place] [address]    Report Status PENDING  Incomplete    Radiology Studies: No results found.     Wahid Holley T. Cheralyn Oliver [facility]  If 7PM-7AM, please contact night-coverage [email or web address] 08/24/2021, 5:04 PM

## 2021-08-25 DIAGNOSIS — J441 Chronic obstructive pulmonary disease with (acute) exacerbation: Secondary | ICD-10-CM | POA: Diagnosis not present

## 2021-08-25 DIAGNOSIS — L03119 Cellulitis of unspecified part of limb: Secondary | ICD-10-CM | POA: Diagnosis not present

## 2021-08-25 DIAGNOSIS — I2510 Atherosclerotic heart disease of native coronary artery without angina pectoris: Secondary | ICD-10-CM | POA: Diagnosis not present

## 2021-08-25 DIAGNOSIS — F17200 Nicotine dependence, unspecified, uncomplicated: Secondary | ICD-10-CM | POA: Diagnosis not present

## 2021-08-25 NOTE — Consult Note (Signed)
[facility] Consultation       MRN : [id number]  Jenny [name] is a 87 y.o. (06-13-1933) female who presents with chief complaint of  Chief Complaint  Patient presents with   Leg Swelling  .  History of Present Illness: 87 year old female with multiple medical problems presents to the emergency room with progressive swelling and erythema as well as ulcerations of the bilateral lower extremities.  The patient has been seen by wound care for quite some time.  She says her legs have never healed.  She is unable or unwilling to wear compression.  At this point she is comfortable in the bed.  She is wearing Ace wraps which she appears to be tolerating well.  Current Facility-Administered Medications  Medication Dose Route Frequency Provider Last Rate Last Admin   acetaminophen (TYLENOL) tablet 650 mg  650 mg Oral Q6H PRN Irene Pap N, DO   650 mg at 08/24/21 1616   acetaminophen-codeine (TYLENOL #3) 300-30 MG per tablet 1 tablet  1 tablet Oral Q6H PRN Athena Masse, MD   1 tablet at 08/24/21 2142   ascorbic acid (VITAMIN C) tablet 250 mg  250 mg Oral BID Wendee Beavers T, MD   250 mg at 08/25/21 0900   aspirin EC tablet 81 mg  81 mg Oral Daily Irene Pap N, DO   81 mg at 08/25/21 0901   ceFAZolin (ANCEF) IVPB 1 g/50 mL premix  1 g Intravenous Q8H Wendee Beavers T, MD 100 mL/hr at 08/25/21 0545 1 g at 08/25/21 0545   clopidogrel (PLAVIX) tablet 75 mg  75 mg Oral Daily Irene Pap N, DO   75 mg at 08/25/21 0900   dorzolamide (TRUSOPT) 2 % ophthalmic solution 1 drop  1 drop Both Eyes BID Irene Pap N, DO   1 drop at 08/24/21 2116   enoxaparin (LOVENOX) injection 47.5 mg  0.5 mg/kg Subcutaneous Q24H Irene Pap N, DO   47.5 mg at 08/24/21 2117   famotidine (PEPCID) tablet 20 mg  20 mg Oral BID Irene Pap N, DO   20 mg at 08/24/21 2114   furosemide (LASIX) tablet 40 mg  40 mg Oral Daily Wendee Beavers T, MD   40 mg at 08/25/21 0900   gabapentin (NEURONTIN) capsule 100 mg   100 mg Oral q morning Rito Ehrlich A, RPH   100 mg at 08/25/21 0905   gabapentin (NEURONTIN) capsule 200 mg  200 mg Oral QHS Rito Ehrlich A, RPH   200 mg at 08/24/21 2115   ipratropium-albuterol (DUONEB) 0.5-2.5 (3) MG/3ML nebulizer solution 3 mL  3 mL Nebulization Q4H PRN Gonfa, Taye T, MD   3 mL at 08/25/21 0905   latanoprost (XALATAN) 0.005 % ophthalmic solution 1 drop  1 drop Both Eyes q AM Hall, Carole N, DO   1 drop at 08/24/21 0855   losartan (COZAAR) tablet 50 mg  50 mg Oral Daily Wendee Beavers T, MD   50 mg at 08/25/21 0900   melatonin tablet 3 mg  3 mg Oral QHS PRN Irene Pap N, DO       metoprolol succinate (TOPROL-XL) 24 hr tablet 25 mg  25 mg Oral Daily Hall, Carole N, DO   25 mg at 08/25/21 0900   multivitamin-lutein (OCUVITE-LUTEIN) capsule 1 capsule  1 capsule Oral Daily Wendee Beavers T, MD   1 capsule at 08/24/21 1219   nystatin (MYCOSTATIN/NYSTOP) topical powder   Topical BID Mercy Riding, MD  Given at 08/24/21 2118   ondansetron (ZOFRAN) injection 4 mg  4 mg Intravenous Q6H PRN Irene Pap N, DO       oxyCODONE (Oxy IR/ROXICODONE) immediate release tablet 5 mg  5 mg Oral Q6H PRN Athena Masse, MD       polyethylene glycol (MIRALAX / GLYCOLAX) packet 17 g  17 g Oral Daily PRN Hall, Carole N, DO       protein supplement (ENSURE MAX) liquid  11 oz Oral BID Wendee Beavers T, MD   11 oz at 08/25/21 0906   rosuvastatin (CRESTOR) tablet 20 mg  20 mg Oral Daily Irene Pap N, DO   20 mg at 08/25/21 0900   umeclidinium-vilanterol (ANORO ELLIPTA) 62.5-25 MCG/INH 1 puff  1 puff Inhalation [address], Carole N, DO   1 puff at 08/24/21 5027    Past Medical History:  Diagnosis Date   ACUT DUOD ULCER W/HEMORR&PERF W/O MENTION OBST 10/05/2009   NSAID induced   ALLERGIC RHINITIS CAUSE UNSPECIFIED    ANEMIA-NOS    CAD (coronary artery disease) 06/2009   DEs RCA with 70% LAD and EF 60%   COPD    mild obst on PFTs 03/2010   Diabetes mellitus 06/2010 dx   Mild, diet controlled   GLAUCOMA     HYPERLIPIDEMIA    HYPERTENSION, BENIGN    MYOCARDIAL INFARCTION 06/2009   des to [place]     Past Surgical History:  Procedure Laterality Date   HEMORRHOID SURGERY  1990   Right knee surgery      Social History Social History   Tobacco Use   Smoking status: Every Day    Packs/day: 0.50    Years: 60.00    Pack years: 30.00    Types: Cigarettes   Smokeless tobacco: Never   Tobacco comments:    She lives in [place] with her significant other ([place])0  Vaping Use   Vaping Use: Never used  Substance Use Topics   Alcohol use: Yes    Comment: Occassional   Drug use: No    Family History Family History  Problem Relation Age of Onset   Hypertension Mother    Ulcers Mother    Stomach cancer Father    Stroke Maternal Grandmother    Heart attack Neg Hx     Allergies  Allergen Reactions   Lactose Intolerance (Gi)    Atorvastatin Itching    Itching and myaliga   Meperidine Hcl Other (See Comments)    Not known   Pravastatin Rash    rash     REVIEW OF SYSTEMS (Negative unless checked)  Constitutional: _0 Weight loss  _1 Fever  _2 Chills Cardiac: _3 Chest pain   _4 Chest pressure   _5 Palpitations   _6 Shortness of breath when laying flat   _7 Shortness of breath at rest   _8 Shortness of breath with exertion. Vascular:  _9 Pain in legs with walking   _10 Pain in legs at rest   _11 Pain in legs when laying flat   _12 Claudication   _13 Pain in feet when walking  _14 Pain in feet at rest  _15 Pain in feet when laying flat   _16 History of DVT   _17 Phlebitis   _18 Swelling in legs   _19 Varicose veins   _20 Non-healing ulcers Pulmonary:   _21 Uses home oxygen   _22 Productive cough   _23 Hemoptysis   _24 Wheeze  _25 COPD   _26 Asthma Neurologic:  _27 Dizziness  _28 Blackouts   _29 Seizures   _30 History of stroke   _31 History of TIA  _32 Aphasia   _33   Temporary blindness   _0 Dysphagia   _1 Weakness or numbness in arms   _2 Weakness or numbness in legs Musculoskeletal:  _3 Arthritis   _4 Joint swelling   _5 Joint  pain   _6 Low back pain Hematologic:  _7 Easy bruising  _8 Easy bleeding   _9 Hypercoagulable state   _10 Anemic  _11 Hepatitis Gastrointestinal:  _12 Blood in stool   _13 Vomiting blood  _14 Gastroesophageal reflux/heartburn   _15 Difficulty swallowing. Genitourinary:  _16 Chronic kidney disease   _17 Difficult urination  _18 Frequent urination  _19 Burning with urination   _20 Blood in urine Skin:  _21 Rashes   _22 Ulcers   _23 Wounds Psychological:  _24 History of anxiety   _25  History of major depression.  Physical Examination  Vitals:   08/24/21 2129 08/25/21 0349 08/25/21 0601 08/25/21 0841  BP: (!) 144/55 138/66  (!) 142/62  Pulse: 61 63  66  Resp: _26 Temp: 98.2 F (36.8 C) 98.1 F (36.7 C)  98.1 F (36.7 C)  TempSrc:      SpO2: 98% 96%  97%  Weight:   92.6 kg   Height:       Body mass index is 37.34 kg/m.  Head: [place]/AT Ear/Nose/Throat: Hearing grossly intact, nares w/o erythema or drainage, oropharynx w/o Erythema/Exudate Eyes: PERRLA, EOMI.  Neck: Supple, no nuchal rigidity.  No JVD.  Pulmonary:  Good air movement, clear to auscultation bilaterally.  Cardiac: RRR, normal S1, S2, no Murmurs, rubs or gallops.  Vascular: Bilateral femoral pulses easily palpable.  I do not feel popliteal or pedal pulses. Gastrointestinal: soft, non-tender/non-distended. No guarding/reflex. No masses, surgical incisions, or scars. Musculoskeletal: M/S 5/5 throughout.  Extremities without ischemic changes.  No deformity or atrophy. No edema. Neurologic: CN 2-12 intact. Pain and light touch intact in extremities.  Symmetrical.  Speech is fluent. Motor exam as listed above. Psychiatric: Judgment intact, Mood & affect appropriate for pt's clinical situation.  Patient has obvious venous insufficiency with ulcerations and erythema bilaterally.  Diagnostic Studies IMPRESSION: 1. Resting left ankle-brachial index of 0.7 consistent with at least moderate underlying peripheral arterial disease. 2. Resting right  ankle-brachial index of 1.0 is normal. However, this finding is felt to be spurious and falsely elevated due to poor compressibility of the vessels given the abnormal underlying arterial waveforms.   CBC Lab Results  Component Value Date   WBC 6.2 08/24/2021   HGB 13.9 08/24/2021   HCT 42.2 08/24/2021   MCV 96.3 08/24/2021   PLT 265 08/24/2021    BMET    Component Value Date/Time   NA 136 08/24/2021 0439   NA 138 02/12/2021 1434   K 3.9 08/24/2021 0439   CL 104 08/24/2021 0439   CO2 25 08/24/2021 0439   GLUCOSE 114 (H) 08/24/2021 0439   BUN 31 (H) 08/24/2021 0439   BUN 14 02/12/2021 1434   CREATININE 0.98 08/24/2021 0439   CALCIUM 8.3 (L) 08/24/2021 0439   GFRNONAA 57 (L) 08/24/2021 0439   GFRAA  10/09/2009 0350    >60        The eGFR has been calculated using the MDRD equation. This calculation has not been validated in all clinical situations. eGFR's persistently <60 mL/min signify possible Chronic Kidney Disease.   Estimated Creatinine Clearance: 44.5 mL/min (by C-G formula based on SCr of 0.98 mg/dL).  COAG Lab Results  Component Value Date   INR 1.07 10/06/2009   INR 1.13 10/05/2009   INR 0.9 07/07/2009   Assessment/Plan 87 year old female with chronic venous insufficiency with ulceration and erythema.  At this point in time I  do not think this is an arterial problem it is obviously venous.  Compression is really the only option for her.  Calmoseptine works nicely for this problem however we do not have that on formulary here.  This product will resolve the cellulitis and erythema.  Compression is paramount so we could consider a Profore light or a Dynaflex for compression.  Follow-up in the wound care center is critical for long-term success.  Thank you for allowing me to participate in the care of this patient.  If there are any questions or concerns regarding my opinions please do not hesitate to contact.   Shaune Spittle, MD Vascular and Endovascular  Surgery  08/25/2021 11:02 AM

## 2021-08-25 NOTE — Plan of Care (Signed)
  Problem: Education: Goal: Knowledge of General Education information will improve Description: Including pain rating scale, medication(s)/side effects and non-pharmacologic comfort measures Outcome: Progressing   Problem: Health Behavior/Discharge Planning: Goal: Ability to manage health-related needs will improve Outcome: Progressing   Problem: Clinical Measurements: Goal: Ability to maintain clinical measurements within normal limits will improve Outcome: Progressing Goal: Will remain free from infection Outcome: Progressing Goal: Diagnostic test results will improve Outcome: Progressing Goal: Respiratory complications will improve Outcome: Progressing Goal: Cardiovascular complication will be avoided Outcome: Progressing   Problem: Activity: Goal: Risk for activity intolerance will decrease Outcome: Progressing   Problem: Nutrition: Goal: Adequate nutrition will be maintained Outcome: Progressing   Problem: Coping: Goal: Level of anxiety will decrease Outcome: Progressing   Problem: Elimination: Goal: Will not experience complications related to bowel motility Outcome: Progressing Goal: Will not experience complications related to urinary retention Outcome: Progressing   Problem: Pain Managment: Goal: General experience of comfort will improve Outcome: Progressing   Problem: Safety: Goal: Ability to remain free from injury will improve Outcome: Progressing   Problem: Skin Integrity: Goal: Risk for impaired skin integrity will decrease Outcome: Progressing   Problem: Clinical Measurements: Goal: Ability to avoid or minimize complications of infection will improve Outcome: Progressing   Problem: Skin Integrity: Goal: Skin integrity will improve Outcome: Progressing   Problem: Education: Goal: Ability to demonstrate management of disease process will improve Outcome: Progressing Goal: Ability to verbalize understanding of medication therapies will  improve Outcome: Progressing Goal: Individualized Educational Video(s) Outcome: Progressing   Problem: Activity: Goal: Capacity to carry out activities will improve Outcome: Progressing   Problem: Cardiac: Goal: Ability to achieve and maintain adequate cardiopulmonary perfusion will improve Outcome: Progressing   

## 2021-08-25 NOTE — TOC Progression Note (Signed)
Transition of Care [facility]) - Progression Note    Patient Details  Name: Jenny [name] MRN: [id number] Date of Birth: 04-21-1933  Transition of Care [facility]) CM/SW Contact  Bing Quarry, RN Phone Number: 08/25/2021, 4:42 PM  Clinical Narrative:  08/25/21  Patient in a lot of pain due to removal of skin. Spoke with son via phone and he said her biggest fear was how much HH would cost even though she has supplemental C coverage that son feels she would pay nothing out of pocket. He is out of town until Monday am. He was going to speak to her as well this afternoon but indicated she was in a very bad mood and I should try in the am and try to help her understand the financials, which I explained she would need to reach out to specific plan though we would try to help. Gabriel Cirri RN CM 448 pm.    Expected Discharge Plan: Home w Home Health Services Barriers to Discharge: Continued Medical Work up  Expected Discharge Plan and Services Expected Discharge Plan: Home w Home Health Services     Post Acute Care Choice:  (TBD) Living arrangements for the past 2 months: Single Family Home                                       Social Determinants of Health (SDOH) Interventions    Readmission Risk Interventions No flowsheet data found.

## 2021-08-25 NOTE — Progress Notes (Signed)
PROGRESS NOTE  Jenny [name] IOX:[id number] DOB: 09-18-1933   PCP: Kirby Funk, MD  Patient is from: Home.  Lives alone in [place].  DOA: 08/22/2021 LOS: 3  Chief complaints:  Chief Complaint  Patient presents with   Leg Swelling     Brief Narrative / Interim history: 87 year old F with PMH of CAD/DES stent in 2010, COPD, chronic venous insufficiency, bilateral carotid artery stenosis, tobacco use disorder, HTN, HLD and Mobitz 1 AVB presenting with bilateral lower extremity edema and erythema, and admitted for possible lower extremity cellulitis in the setting of chronic venous stasis, and possible acute on chronic diastolic CHF.  Afebrile, hemodynamically stable.  No significant finding on basic labs.  BNP elevated to 289.  Started on IV ceftriaxone and IV Lasix.  TTE and ABI ordered.  TTE with LVEF of 60 to 65%, indeterminate DD, RVSP of 65 mmHg, moderate RAE.  Left ABI 0.7 consistent with at least moderate underlying PAD.  Right ABI 1.0 but felt to be spurious and falsely elevated due to poor compressibility.  Patient is already on aspirin, Plavix and Crestor.  Vascular surgery consulted and felt the problem to be venous, and recommended compression dressing and follow-up in wound care center.  Patient is on IV Ancef.  Remains in-house due to the need for daily dressing.   Subjective: Seen and examined earlier this morning.  No major events overnight of this morning.  Felt significant pain during dressing change but pain improved.  She also felt short of breath earlier this morning but improved with breathing treatments.  No complaints at this time.  She rates her pain 3/10 in the right and 2/10 in the left leg.  Denies GI or UTI symptoms.  Objective: Vitals:   08/25/21 0349 08/25/21 0601 08/25/21 0841 08/25/21 1201  BP: 138/66  (!) 142/62 129/62  Pulse: 63  66 62  Resp: 20  18 20   Temp: 98.1 F (36.7 C)  98.1 F (36.7 C) 98.9 F (37.2 C)  TempSrc:      SpO2: 96%  97% 97%   Weight:  92.6 kg    Height:        Intake/Output Summary (Last 24 hours) at 08/25/2021 1424 Last data filed at 08/24/2021 2248 Gross per 24 hour  Intake 240 ml  Output 900 ml  Net -660 ml   Filed Weights   08/23/21 0500 08/24/21 0429 08/25/21 0601  Weight: 93.1 kg 93.3 kg 92.6 kg    Examination:  GENERAL: No apparent distress.  Nontoxic. HEENT: MMM.  Vision and hearing grossly intact.  NECK: Supple.  No apparent JVD.  RESP: 96% on RA.  No IWOB.  Fair aeration bilaterally. CVS:  RRR. Heart sounds normal.  ABD/GI/GU: BS+. Abd soft, NTND.  MSK/EXT:  Moves extremities. No apparent deformity.  Ace wrap over BLE. SKIN: Both legs wrapped with dressing and Ace wrap. NEURO: Awake and alert. Oriented appropriately.  No apparent focal neuro deficit. PSYCH: Calm. Normal affect.   Procedures:  None  Microbiology summarized: COVID-19 and influenza PCR nonreactive. Blood cultures NGTD.  Assessment & Plan: Bilateral lower extremity chronic stasis dermatitis with wounds  Possible cellulitis in the setting of the above. PAD-Left ABI 0.7.  Right ABI 1.0 but spurious and falsely elevated due to poor compressibility.  -De-escalateD antibiotics to IV Ancef on 9/15 -Continue aspirin, Plavix and statin. -Encouraged tobacco cessation-but she is very reluctant about this -WOCN recommended daily dressing change -Appreciate input by vascular surgery-compression dressing and follow-up in wound care -  Patient lives alone.  Not able to manage daily dressing at home by herself.   Possible COPD exacerbation-presented with shortness of breath, wheezing and cough.  Likely due to ongoing cigarette smoking.  Respiratory symptoms improved -Continue Anoro Ellipta with as needed DuoNeb -Antibiotics as above. -Hold off steroid for now given improvement in his symptoms without a steroid   Acute on chronic diastolic CHF/PAH: TTE with LVEF of 60 to 65%, indeterminate DD and RVSP of 65 mmHg.  CXR with  cardiomegaly and vascular congestion.  BNP slightly elevated.  Started on IV Lasix with improvement in her breathing.  Transition to p.o. Lasix.  Good urine output. -Change to p.o. Lasix 40 mg daily -Monitor fluid status, renal functions and electrolytes -Sodium and fluid restriction  History of CAD/DES stent in 2010-denies chest pain. -Continue Toprol-XL, aspirin, Plavix and statin  Essential hypertension: BP within fair range. -Continue home medication   Hyperlipidemia -Continue home Crestor.   Carotid artery disease: Mild bilateral carotid disease by Doppler in 2020 -Continue home aspirin, Plavix and statin   History of Mobitz type I AV block: EKG with A. fib.  Persistent atrial fibrillation: Rate controlled.  Not on anticoagulation -Continue home Toprol-XL as above   Tobacco use disorder-1 pack/day, started smoking at the age of 55. -Now ready for tobacco cessation.  She declined meds as well.    Class II obesity Body mass index is 37.34 kg/m. Nutrition Problem: Increased nutrient needs Etiology: wound healing Signs/Symptoms: estimated needs     DVT prophylaxis:    On subcu Lovenox  Code Status: Full code Family Communication: Updated patient's son at bedside on 9/16.  None at bedside today Level of care: Med-Surg.   Status is: Inpatient  Remains inpatient appropriate because: Due to need for daily wound dressing change.  Patient lives alone and did not demonstrate an ability to manage daily dressing herself.  Dispo:  Patient From: Home  Planned Disposition: To be determined  Medically stable for discharge: No         Consultants:  Vascular surgery   Sch Meds:  Scheduled Meds:  vitamin C  250 mg Oral BID   aspirin EC  81 mg Oral Daily   clopidogrel  75 mg Oral Daily   dorzolamide  1 drop Both Eyes BID   enoxaparin (LOVENOX) injection  0.5 mg/kg Subcutaneous Q24H   famotidine  20 mg Oral BID   furosemide  40 mg Oral Daily   gabapentin  100 mg Oral  q morning   gabapentin  200 mg Oral QHS   latanoprost  1 drop Both Eyes q AM   losartan  50 mg Oral Daily   metoprolol succinate  25 mg Oral Daily   multivitamin-lutein  1 capsule Oral Daily   nystatin   Topical BID   Ensure Max Protein  11 oz Oral BID   rosuvastatin  20 mg Oral Daily   umeclidinium-vilanterol  1 puff Inhalation BH-q7a   Continuous Infusions:   ceFAZolin (ANCEF) IV 1 g (08/25/21 0545)   PRN Meds:.acetaminophen, acetaminophen-codeine, ipratropium-albuterol, melatonin, ondansetron (ZOFRAN) IV, oxyCODONE, polyethylene glycol  Antimicrobials: Anti-infectives (From admission, onward)    Start     Dose/Rate Route Frequency Ordered Stop   08/24/21 1930  fluconazole (DIFLUCAN) tablet 150 mg        150 mg Oral  Once 08/24/21 1833 08/24/21 2115   08/23/21 1515  ceFAZolin (ANCEF) IVPB 1 g/50 mL premix        1 g 100 mL/hr  over 30 Minutes Intravenous Every 8 hours 08/23/21 1417 08/30/21 1359   08/23/21 1400  cefTRIAXone (ROCEPHIN) 2 g in sodium chloride 0.9 % 100 mL IVPB  Status:  Discontinued        2 g 200 mL/hr over 30 Minutes Intravenous Every 24 hours 08/22/21 1510 08/23/21 1417   08/22/21 1330  cefTRIAXone (ROCEPHIN) 1 g in sodium chloride 0.9 % 100 mL IVPB        1 g 200 mL/hr over 30 Minutes Intravenous  Once 08/22/21 1323 08/22/21 1454        I have personally reviewed the following labs and images: CBC: Recent Labs  Lab 08/22/21 1120 08/23/21 0627 08/24/21 0439  WBC 6.0 6.9 6.2  HGB 14.7 14.0 13.9  HCT 46.2* 42.9 42.2  MCV 100.9* 99.1 96.3  PLT 240 263 265   BMP &GFR Recent Labs  Lab 08/22/21 1120 08/23/21 0627 08/24/21 0439  NA 141 138 136  K 4.0 4.3 3.9  CL 105 104 104  CO2 26 24 25   GLUCOSE 139* 114* 114*  BUN 24* 23 31*  CREATININE 0.99 0.90 0.98  CALCIUM 8.1* 8.2* 8.3*  MG  --  2.2 2.1  PHOS  --   --  4.0   Estimated Creatinine Clearance: 44.5 mL/min (by C-G formula based on SCr of 0.98 mg/dL). Liver & Pancreas: Recent Labs  Lab  08/23/21 0627 08/24/21 0439  AST 20  --   ALT 24  --   ALKPHOS 105  --   BILITOT 0.7  --   PROT 6.5  --   ALBUMIN 3.1* 3.0*   No results for input(s): LIPASE, AMYLASE in the last 168 hours. No results for input(s): AMMONIA in the last 168 hours. Diabetic: No results for input(s): HGBA1C in the last 72 hours. No results for input(s): GLUCAP in the last 168 hours. Cardiac Enzymes: No results for input(s): CKTOTAL, CKMB, CKMBINDEX, TROPONINI in the last 168 hours. No results for input(s): PROBNP in the last 8760 hours. Coagulation Profile: No results for input(s): INR, PROTIME in the last 168 hours. Thyroid Function Tests: No results for input(s): TSH, T4TOTAL, FREET4, T3FREE, THYROIDAB in the last 72 hours. Lipid Profile: No results for input(s): CHOL, HDL, LDLCALC, TRIG, CHOLHDL, LDLDIRECT in the last 72 hours. Anemia Panel: No results for input(s): VITAMINB12, FOLATE, FERRITIN, TIBC, IRON, RETICCTPCT in the last 72 hours. Urine analysis: No results found for: COLORURINE, APPEARANCEUR, LABSPEC, PHURINE, GLUCOSEU, HGBUR, BILIRUBINUR, KETONESUR, PROTEINUR, UROBILINOGEN, NITRITE, LEUKOCYTESUR Sepsis Labs: Invalid input(s): PROCALCITONIN, LACTICIDVEN  Microbiology: Recent Results (from the past 240 hour(s))  CULTURE, BLOOD (ROUTINE X 2) w Reflex to ID Panel     Status: None (Preliminary result)   Collection Time: 08/22/21 11:20 AM   Specimen: BLOOD  Result Value Ref Range Status   Specimen Description BLOOD RIGHT ANTECUBITAL  Final   Special Requests   Final    BOTTLES DRAWN AEROBIC AND ANAEROBIC Blood Culture results may not be optimal due to an excessive volume of blood received in culture bottles   Culture   Final    NO GROWTH 3 DAYS Performed at [facility], [address] [place], [place]    Report Status PENDING  Incomplete  SARS CORONAVIRUS 2 (TAT 6-24 HRS) Nasopharyngeal Nasopharyngeal Swab     Status: None   Collection Time: 08/22/21 12:24 PM    Specimen: Nasopharyngeal Swab  Result Value Ref Range Status   SARS Coronavirus 2 NEGATIVE NEGATIVE Final    Comment: (  NOTE) SARS-CoV-2 target nucleic acids are NOT DETECTED.  The SARS-CoV-2 RNA is generally detectable in upper and lower respiratory specimens during the acute phase of infection. Negative results do not preclude SARS-CoV-2 infection, do not rule out co-infections with other pathogens, and should not be used as the sole basis for treatment or other patient management decisions. Negative results must be combined with clinical observations, patient history, and epidemiological information. The expected result is Negative.  Fact Sheet for Patients: HairSlick.no  Fact Sheet for Healthcare Providers: quierodirigir.com  This test is not yet approved or cleared by the [place] FDA and  has been authorized for detection and/or diagnosis of SARS-CoV-2 by FDA under an Emergency Use Authorization (EUA). This EUA will remain  in effect (meaning this test can be used) for the duration of the COVID-19 declaration under Se ction 564(b)(1) of the Act, 21 U.S.C. section 360bbb-3(b)(1), unless the authorization is terminated or revoked sooner.  Performed at [facility], [address], [place], [place] [address]   CULTURE, BLOOD (ROUTINE X 2) w Reflex to ID Panel     Status: None (Preliminary result)   Collection Time: 08/22/21  4:21 PM   Specimen: BLOOD  Result Value Ref Range Status   Specimen Description BLOOD RIGHT ANTECUBITAL  Final   Special Requests   Final    BOTTLES DRAWN AEROBIC AND ANAEROBIC Blood Culture adequate volume   Culture   Final    NO GROWTH 3 DAYS Performed at [facility], [address] [place], [place] [address]    Report Status PENDING  Incomplete    Radiology Studies: No results found.     Emilee Market T. Velena Keegan [facility]  If 7PM-7AM, please contact  night-coverage [email or web address] 08/25/2021, 2:24 PM

## 2021-08-25 NOTE — Progress Notes (Signed)
D/C tele. RBV order from Candelaria Stagers, MD.

## 2021-08-26 DIAGNOSIS — F17200 Nicotine dependence, unspecified, uncomplicated: Secondary | ICD-10-CM | POA: Diagnosis not present

## 2021-08-26 DIAGNOSIS — J441 Chronic obstructive pulmonary disease with (acute) exacerbation: Secondary | ICD-10-CM | POA: Diagnosis not present

## 2021-08-26 DIAGNOSIS — L03119 Cellulitis of unspecified part of limb: Secondary | ICD-10-CM | POA: Diagnosis not present

## 2021-08-26 DIAGNOSIS — I2510 Atherosclerotic heart disease of native coronary artery without angina pectoris: Secondary | ICD-10-CM | POA: Diagnosis not present

## 2021-08-26 MED ORDER — ACETAMINOPHEN 325 MG PO TABS: 650.0000 mg | ORAL_TABLET | Freq: Four times a day (QID) | ORAL | Status: DC

## 2021-08-26 MED ORDER — DICLOFENAC SODIUM 1 % EX GEL
2.0000 g | Freq: Four times a day (QID) | CUTANEOUS | Status: DC
  Administered 2021-08-26 – 2021-08-27 (×4): 2 g via TOPICAL
  Filled 2021-08-26: qty 100

## 2021-08-26 MED ORDER — ACETAMINOPHEN 325 MG PO TABS
650.0000 mg | ORAL_TABLET | Freq: Four times a day (QID) | ORAL | Status: DC | PRN
  Administered 2021-08-27: 650 mg via ORAL
  Filled 2021-08-26: qty 2

## 2021-08-26 NOTE — Progress Notes (Signed)
PROGRESS NOTE  Jenny [name] XIP:[id number] DOB: 06-24-1933   PCP: Kirby Funk, MD  Patient is from: Home.  Lives alone in [place].  DOA: 08/22/2021 LOS: 4  Chief complaints:  Chief Complaint  Patient presents with   Leg Swelling     Brief Narrative / Interim history: 87 year old F with PMH of CAD/DES stent in 2010, COPD, chronic venous insufficiency, bilateral carotid artery stenosis, tobacco use disorder, HTN, HLD and Mobitz 1 AVB presenting with bilateral lower extremity edema and erythema, and admitted for possible lower extremity cellulitis in the setting of chronic venous stasis, and possible acute on chronic diastolic CHF.  Afebrile, hemodynamically stable.  No significant finding on basic labs.  BNP elevated to 289.  Started on IV ceftriaxone and IV Lasix.  TTE and ABI ordered.  TTE with LVEF of 60 to 65%, indeterminate DD, RVSP of 65 mmHg, moderate RAE.  Left ABI 0.7 consistent with at least moderate underlying PAD.  Right ABI 1.0 but felt to be spurious and falsely elevated due to poor compressibility.  Patient is already on aspirin, Plavix and Crestor.  Vascular surgery consulted and felt the problem to be venous, and recommended compression dressing and follow-up in wound care center.  Patient is on IV Ancef.  Remains in-house due to the need for daily dressing.   Subjective: Seen and examined earlier this morning.  No major events overnight of this morning.  Reports aching all over and in right shoulder.  Tylenol # 3 is helping.  She does want to take anything stronger.  She denies chest pain or shortness of breath.  Denies GI or UTI symptoms.  Objective: Vitals:   08/25/21 1813 08/25/21 2047 08/26/21 0415 08/26/21 0834  BP: (!) 157/94 (!) 148/79 117/63 (!) 160/71  Pulse: (!) 51 (!) 57 (!) 55 71  Resp: 18 18 18 16   Temp: 98.4 F (36.9 C) 99.1 F (37.3 C) (!) 97.5 F (36.4 C) 98 F (36.7 C)  TempSrc:    Oral  SpO2: (!) 63% 94% 95% 91%  Weight:   92.4 kg    Height:        Intake/Output Summary (Last 24 hours) at 08/26/2021 1209 Last data filed at 08/26/2021 0604 Gross per 24 hour  Intake 350 ml  Output --  Net 350 ml   Filed Weights   08/24/21 0429 08/25/21 0601 08/26/21 0415  Weight: 93.3 kg 92.6 kg 92.4 kg    Examination:  GENERAL: No apparent distress.  Nontoxic. HEENT: MMM.  Vision and hearing grossly intact.  NECK: Supple.  No apparent JVD.  RESP: 95% on RA.  No IWOB.  Fair aeration bilaterally. CVS:  RRR. Heart sounds normal.  ABD/GI/GU: BS+. Abd soft, NTND.  MSK/EXT:  Moves extremities.  Ace wrap over BLE.  Limited active ROM in right shoulder but intact passive ROM. SKIN: Ace wrap over BLE. NEURO: Awake and alert. Oriented appropriately.  No apparent focal neuro deficit. PSYCH: Calm. Normal affect.   Procedures:  None  Microbiology summarized: COVID-19 and influenza PCR nonreactive. Blood cultures NGTD.  Assessment & Plan: Bilateral lower extremity chronic stasis dermatitis with wounds  Possible cellulitis in the setting of the above. PAD-Left ABI 0.7.  Right ABI 1.0 but spurious and falsely elevated due to poor compressibility.  -IV ceftriaxone>> IV Ancef-we will complete total of 10 days course. -Continue aspirin, Plavix and statin. -Encouraged tobacco cessation-but she is very reluctant about this -WOCN recommended daily dressing change -Appreciate input by vascular surgery-compression dressing and follow-up  in wound care -Patient lives alone.  Not able to manage daily dressing at home by herself.   Possible COPD exacerbation-presented with shortness of breath, wheezing and cough.  Likely due to ongoing cigarette smoking.  Respiratory symptoms improved -Continue Anoro Ellipta with as needed DuoNeb -Antibiotics as above. -Hold off steroid for now given improvement in his symptoms without a steroid   Acute on chronic diastolic CHF/PAH: TTE with LVEF of 60 to 65%, indeterminate DD and RVSP of 65 mmHg.  CXR with  cardiomegaly and vascular congestion.  BNP slightly elevated.  Started on IV Lasix with improvement in her breathing.  Transitioned to p.o. Lasix.  Good urine output. -Continue p.o. Lasix 40 mg daily -Monitor fluid status, renal functions and electrolytes -Sodium and fluid restriction  History of CAD/DES stent in 2010-denies chest pain. -Continue Toprol-XL, aspirin, Plavix and statin  Essential hypertension: BP within fair range. -Continue home medication   Hyperlipidemia -Continue home Crestor.   Carotid artery disease: Mild bilateral carotid disease by Doppler in 2020 -Continue home aspirin, Plavix and statin   History of Mobitz type I AV block: EKG with A. fib.  Persistent atrial fibrillation: Rate controlled.  Not on anticoagulation.  Patient will be high risk for bleeding with anticoagulation and DAPT.  Given the situation, she would benefit more from DAPT.  -Continue home Toprol-XL as above   Tobacco use disorder-1 pack/day, started smoking at the age of 47. -Now ready for tobacco cessation.  She declined meds as well.    Class II obesity Body mass index is 37.26 kg/m. Nutrition Problem: Increased nutrient needs Etiology: wound healing Signs/Symptoms: estimated needs     DVT prophylaxis:    On subcu Lovenox  Code Status: Full code Family Communication: Patient and patient's RN.  None at bedside. Level of care: Med-Surg.   Status is: Inpatient  Remains inpatient appropriate because: Due to need for daily wound dressing change.  Patient lives alone and did not demonstrate an ability to manage daily dressing herself.  She stated that her son will help with dressing once he returns home.  Dispo:  Patient From: Home  Planned Disposition: To be determined  Medically stable for discharge: No         Consultants:  Vascular surgery   Sch Meds:  Scheduled Meds:  vitamin C  250 mg Oral BID   aspirin EC  81 mg Oral Daily   clopidogrel  75 mg Oral Daily    dorzolamide  1 drop Both Eyes BID   enoxaparin (LOVENOX) injection  0.5 mg/kg Subcutaneous Q24H   famotidine  20 mg Oral BID   furosemide  40 mg Oral Daily   gabapentin  100 mg Oral q morning   gabapentin  200 mg Oral QHS   latanoprost  1 drop Both Eyes q AM   losartan  50 mg Oral Daily   metoprolol succinate  25 mg Oral Daily   multivitamin-lutein  1 capsule Oral Daily   nystatin   Topical BID   Ensure Max Protein  11 oz Oral BID   rosuvastatin  20 mg Oral Daily   umeclidinium-vilanterol  1 puff Inhalation BH-q7a   Continuous Infusions:   ceFAZolin (ANCEF) IV Stopped (08/26/21 0550)   PRN Meds:.acetaminophen, acetaminophen-codeine, ipratropium-albuterol, melatonin, ondansetron (ZOFRAN) IV, oxyCODONE, polyethylene glycol  Antimicrobials: Anti-infectives (From admission, onward)    Start     Dose/Rate Route Frequency Ordered Stop   08/24/21 1930  fluconazole (DIFLUCAN) tablet 150 mg  150 mg Oral  Once 08/24/21 1833 08/24/21 2115   08/23/21 1515  ceFAZolin (ANCEF) IVPB 1 g/50 mL premix        1 g 100 mL/hr over 30 Minutes Intravenous Every 8 hours 08/23/21 1417 08/30/21 1359   08/23/21 1400  cefTRIAXone (ROCEPHIN) 2 g in sodium chloride 0.9 % 100 mL IVPB  Status:  Discontinued        2 g 200 mL/hr over 30 Minutes Intravenous Every 24 hours 08/22/21 1510 08/23/21 1417   08/22/21 1330  cefTRIAXone (ROCEPHIN) 1 g in sodium chloride 0.9 % 100 mL IVPB        1 g 200 mL/hr over 30 Minutes Intravenous  Once 08/22/21 1323 08/22/21 1454        I have personally reviewed the following labs and images: CBC: Recent Labs  Lab 08/22/21 1120 08/23/21 0627 08/24/21 0439  WBC 6.0 6.9 6.2  HGB 14.7 14.0 13.9  HCT 46.2* 42.9 42.2  MCV 100.9* 99.1 96.3  PLT 240 263 265   BMP &GFR Recent Labs  Lab 08/22/21 1120 08/23/21 0627 08/24/21 0439  NA 141 138 136  K 4.0 4.3 3.9  CL 105 104 104  CO2 26 24 25   GLUCOSE 139* 114* 114*  BUN 24* 23 31*  CREATININE 0.99 0.90 0.98   CALCIUM 8.1* 8.2* 8.3*  MG  --  2.2 2.1  PHOS  --   --  4.0   Estimated Creatinine Clearance: 44.4 mL/min (by C-G formula based on SCr of 0.98 mg/dL). Liver & Pancreas: Recent Labs  Lab 08/23/21 0627 08/24/21 0439  AST 20  --   ALT 24  --   ALKPHOS 105  --   BILITOT 0.7  --   PROT 6.5  --   ALBUMIN 3.1* 3.0*   No results for input(s): LIPASE, AMYLASE in the last 168 hours. No results for input(s): AMMONIA in the last 168 hours. Diabetic: No results for input(s): HGBA1C in the last 72 hours. No results for input(s): GLUCAP in the last 168 hours. Cardiac Enzymes: No results for input(s): CKTOTAL, CKMB, CKMBINDEX, TROPONINI in the last 168 hours. No results for input(s): PROBNP in the last 8760 hours. Coagulation Profile: No results for input(s): INR, PROTIME in the last 168 hours. Thyroid Function Tests: No results for input(s): TSH, T4TOTAL, FREET4, T3FREE, THYROIDAB in the last 72 hours. Lipid Profile: No results for input(s): CHOL, HDL, LDLCALC, TRIG, CHOLHDL, LDLDIRECT in the last 72 hours. Anemia Panel: No results for input(s): VITAMINB12, FOLATE, FERRITIN, TIBC, IRON, RETICCTPCT in the last 72 hours. Urine analysis: No results found for: COLORURINE, APPEARANCEUR, LABSPEC, PHURINE, GLUCOSEU, HGBUR, BILIRUBINUR, KETONESUR, PROTEINUR, UROBILINOGEN, NITRITE, LEUKOCYTESUR Sepsis Labs: Invalid input(s): PROCALCITONIN, LACTICIDVEN  Microbiology: Recent Results (from the past 240 hour(s))  CULTURE, BLOOD (ROUTINE X 2) w Reflex to ID Panel     Status: None (Preliminary result)   Collection Time: 08/22/21 11:20 AM   Specimen: BLOOD  Result Value Ref Range Status   Specimen Description BLOOD RIGHT ANTECUBITAL  Final   Special Requests   Final    BOTTLES DRAWN AEROBIC AND ANAEROBIC Blood Culture results may not be optimal due to an excessive volume of blood received in culture bottles   Culture   Final    NO GROWTH 4 DAYS Performed at [facility], [address], [place], [place]    Report Status PENDING  Incomplete  SARS CORONAVIRUS 2 (TAT 6-24 HRS) Nasopharyngeal Nasopharyngeal Swab  Status: None   Collection Time: 08/22/21 12:24 PM   Specimen: Nasopharyngeal Swab  Result Value Ref Range Status   SARS Coronavirus 2 NEGATIVE NEGATIVE Final    Comment: (NOTE) SARS-CoV-2 target nucleic acids are NOT DETECTED.  The SARS-CoV-2 RNA is generally detectable in upper and lower respiratory specimens during the acute phase of infection. Negative results do not preclude SARS-CoV-2 infection, do not rule out co-infections with other pathogens, and should not be used as the sole basis for treatment or other patient management decisions. Negative results must be combined with clinical observations, patient history, and epidemiological information. The expected result is Negative.  Fact Sheet for Patients: HairSlick.no  Fact Sheet for Healthcare Providers: quierodirigir.com  This test is not yet approved or cleared by the [place] FDA and  has been authorized for detection and/or diagnosis of SARS-CoV-2 by FDA under an Emergency Use Authorization (EUA). This EUA will remain  in effect (meaning this test can be used) for the duration of the COVID-19 declaration under Se ction 564(b)(1) of the Act, 21 U.S.C. section 360bbb-3(b)(1), unless the authorization is terminated or revoked sooner.  Performed at [facility], [address] [place], [place] [address]   CULTURE, BLOOD (ROUTINE X 2) w Reflex to ID Panel     Status: None (Preliminary result)   Collection Time: 08/22/21  4:21 PM   Specimen: BLOOD  Result Value Ref Range Status   Specimen Description BLOOD RIGHT ANTECUBITAL  Final   Special Requests   Final    BOTTLES DRAWN AEROBIC AND ANAEROBIC Blood Culture adequate volume   Culture   Final    NO GROWTH 4 DAYS Performed at [facility], [address], [place], [place] [address]    Report Status PENDING  Incomplete    Radiology Studies: No results found.     Lauree Yurick T. Seraya Jobst [facility]  If 7PM-7AM, please contact night-coverage [email or web address] 08/26/2021, 12:09 PM

## 2021-08-26 NOTE — Plan of Care (Signed)
  Problem: Education: Goal: Knowledge of General Education information will improve Description: Including pain rating scale, medication(s)/side effects and non-pharmacologic comfort measures Outcome: Progressing   Problem: Health Behavior/Discharge Planning: Goal: Ability to manage health-related needs will improve Outcome: Progressing   Problem: Clinical Measurements: Goal: Ability to maintain clinical measurements within normal limits will improve Outcome: Progressing Goal: Will remain free from infection Outcome: Progressing Goal: Diagnostic test results will improve Outcome: Progressing Goal: Respiratory complications will improve Outcome: Progressing Goal: Cardiovascular complication will be avoided Outcome: Progressing   Problem: Activity: Goal: Risk for activity intolerance will decrease Outcome: Progressing   Problem: Nutrition: Goal: Adequate nutrition will be maintained Outcome: Progressing   Problem: Coping: Goal: Level of anxiety will decrease Outcome: Progressing   Problem: Elimination: Goal: Will not experience complications related to bowel motility Outcome: Progressing Goal: Will not experience complications related to urinary retention Outcome: Progressing   Problem: Pain Managment: Goal: General experience of comfort will improve Outcome: Progressing   Problem: Safety: Goal: Ability to remain free from injury will improve Outcome: Progressing   Problem: Skin Integrity: Goal: Risk for impaired skin integrity will decrease Outcome: Progressing   Problem: Clinical Measurements: Goal: Ability to avoid or minimize complications of infection will improve Outcome: Progressing   Problem: Skin Integrity: Goal: Skin integrity will improve Outcome: Progressing   Problem: Education: Goal: Ability to demonstrate management of disease process will improve Outcome: Progressing Goal: Ability to verbalize understanding of medication therapies will  improve Outcome: Progressing Goal: Individualized Educational Video(s) Outcome: Progressing   Problem: Activity: Goal: Capacity to carry out activities will improve Outcome: Progressing   Problem: Cardiac: Goal: Ability to achieve and maintain adequate cardiopulmonary perfusion will improve Outcome: Progressing   

## 2021-08-26 NOTE — Plan of Care (Signed)
  Problem: Education: Goal: Knowledge of General Education information will improve Description Including pain rating scale, medication(s)/side effects and non-pharmacologic comfort measures Outcome: Progressing   Problem: Health Behavior/Discharge Planning: Goal: Ability to manage health-related needs will improve Outcome: Progressing   

## 2021-08-26 NOTE — Progress Notes (Signed)
Attempted to get a peripheral IV x2. Both attempts were unsuccessful.

## 2021-08-27 DIAGNOSIS — L03119 Cellulitis of unspecified part of limb: Secondary | ICD-10-CM | POA: Diagnosis not present

## 2021-08-27 DIAGNOSIS — I10 Essential (primary) hypertension: Secondary | ICD-10-CM | POA: Diagnosis not present

## 2021-08-27 DIAGNOSIS — M7989 Other specified soft tissue disorders: Secondary | ICD-10-CM

## 2021-08-27 DIAGNOSIS — F17200 Nicotine dependence, unspecified, uncomplicated: Secondary | ICD-10-CM | POA: Diagnosis not present

## 2021-08-27 DIAGNOSIS — I4819 Other persistent atrial fibrillation: Secondary | ICD-10-CM | POA: Diagnosis not present

## 2021-08-27 LAB — CULTURE, BLOOD (ROUTINE X 2)
Culture: NO GROWTH
Culture: NO GROWTH
Special Requests: ADEQUATE

## 2021-08-27 MED ORDER — CEFADROXIL 500 MG PO CAPS: 500.0000 mg | ORAL_CAPSULE | Freq: Two times a day (BID) | ORAL | 0 refills | Status: DC

## 2021-08-27 MED ORDER — BUDESONIDE 0.5 MG/2ML IN SUSP
0.5000 mg | Freq: Two times a day (BID) | RESPIRATORY_TRACT | Status: DC
  Administered 2021-08-27: 0.5 mg via RESPIRATORY_TRACT

## 2021-08-27 MED ORDER — ACETAMINOPHEN-CODEINE #3 300-30 MG PO TABS: 1.0000 | ORAL_TABLET | Freq: Four times a day (QID) | ORAL | 0 refills | Status: AC | PRN

## 2021-08-27 MED ORDER — IPRATROPIUM-ALBUTEROL 0.5-2.5 (3) MG/3ML IN SOLN: 3.0000 mL | Freq: Four times a day (QID) | RESPIRATORY_TRACT | Status: DC | PRN

## 2021-08-27 MED ORDER — METOPROLOL SUCCINATE ER 25 MG PO TB24
25.0000 mg | ORAL_TABLET | Freq: Every day | ORAL | 1 refills | Status: DC
Start: 2021-08-28 — End: 2021-10-23

## 2021-08-27 MED ORDER — TRELEGY ELLIPTA 100-62.5-25 MCG/INH IN AEPB: 1.0000 | INHALATION_SPRAY | Freq: Every day | RESPIRATORY_TRACT | 1 refills | Status: DC

## 2021-08-27 NOTE — Consult Note (Signed)
WOC consulted for LE wounds, patient was seen on 08/23/21 per WOC and orders have been written.  She has been seen by vascular and patient has refused to wear therapeutic compression.  The current wound care orders are appropriate.  No new orders required.   Follow up in wound care center or vascular at DC.  Needs long term care for LEs May be of value to send to lymphedema clinic?  iscussed POC with patient and bedside nurse.  Re consult if needed, will not follow at this time. Thanks  Adileny Delon [employer], RN,CWOCN, CNS, CWON-AP [phone])

## 2021-08-27 NOTE — Progress Notes (Signed)
Physical Therapy Treatment Patient Details Name: Jenny [name] MRN: [id number] DOB: [birth year] Today's Date: 08/27/2021   History of Present Illness 87 y.o. female with medical history significant for coronary artery disease, status post PCI with stenting, hyperlipidemia, hypertension, bilateral carotid artery stenosis, tobacco use disorder, Mobitz 1 AV block, history of inferior MI in 2010, with a drug-eluting stent in the right coronary artery, moderate COPD, who presented from home with complaints of bilateral lower extremity edema, weeping, and worsening erythema.  She initially went to see her primary care provider the day prior who recommended that she come to the ED.    PT Comments    Pt received in Semi-Fowler's position and agreeable to therapy.  Pt agreeable to ambulate around the nursing station and back to room.  Pt able to do so with good technique, however noted to have labored breathing during ambulation.  Pt encouraged to use pursed lip breathing technique when fatigued.  Pt refused ambulation further than one lap and denied any other form of exercises.  Pt transferred to restroom with no AD and then to recliner.  Current discharge plans to HHPT remain appropriate at this time.  Pt will continue to benefit from skilled therapy in order to address deficits listed below.    Recommendations for follow up therapy are one component of a multi-disciplinary discharge planning process, led by the attending physician.  Recommendations may be updated based on patient status, additional functional criteria and insurance authorization.  Follow Up Recommendations  Home health PT     Equipment Recommendations  None recommended by PT    Recommendations for Other Services       Precautions / Restrictions       Mobility  Bed Mobility Overal bed mobility: Modified Independent                  Transfers Overall transfer level: Modified independent Equipment used:  None Transfers: Sit to/from Stand Sit to Stand: Supervision            Ambulation/Gait Ambulation/Gait assistance: Supervision Gait Distance (Feet): 200 Feet Assistive device: Rolling walker (2 wheeled)           Stairs             Wheelchair Mobility    Modified Rankin (Stroke Patients Only)       Balance Overall balance assessment: Modified Independent   Sitting balance-Leahy Scale: Normal     Standing balance support: No upper extremity supported Standing balance-Leahy Scale: Good Standing balance comment: Pt able to use single UE support for ambulation to restroom.                            Cognition Arousal/Alertness: Awake/alert Behavior During Therapy: WFL for tasks assessed/performed Overall Cognitive Status: Within Functional Limits for tasks assessed                                        Exercises      General Comments        Pertinent Vitals/Pain Pain Assessment: Faces Faces Pain Scale: Hurts little more Pain Location: BLE Pain Descriptors / Indicators: Discomfort Pain Intervention(s): Limited activity within patient's tolerance;Monitored during session    Home Living                      Prior Function  PT Goals (current goals can now be found in the care plan section) Acute Rehab PT Goals Patient Stated Goal: go home PT Goal Formulation: With patient Time For Goal Achievement: 09/06/21 Potential to Achieve Goals: Good Progress towards PT goals: Progressing toward goals    Frequency    Min 2X/week      PT Plan      Co-evaluation              AM-PAC PT "6 Clicks" Mobility   Outcome Measure  Help needed turning from your back to your side while in a flat bed without using bedrails?: None Help needed moving from lying on your back to sitting on the side of a flat bed without using bedrails?: None Help needed moving to and from a bed to a chair (including a  wheelchair)?: None Help needed standing up from a chair using your arms (e.g., wheelchair or bedside chair)?: None Help needed to walk in hospital room?: A Little Help needed climbing 3-5 steps with a railing? : A Little 6 Click Score: 22    End of Session   Activity Tolerance: Patient tolerated treatment well Patient left: in chair;with call bell/phone within reach Nurse Communication: Mobility status PT Visit Diagnosis: Muscle weakness (generalized) (M62.81);Difficulty in walking, not elsewhere classified (R26.2)     Time: 8938-1017 PT Time Calculation (min) (ACUTE ONLY): 9 min  Charges:  $Gait Training: 8-22 mins                     Nolon Bussing, PT, DPT 08/27/21, 11:22 AM    Jenny [name] 08/27/2021, 11:20 AM

## 2021-08-27 NOTE — Care Management Important Message (Signed)
Important Message  Patient Details  Name: Jenny [name] MRN: [id number] Date of Birth: [birth year]   Medicare Important Message Given:  Yes     [name] 08/27/2021, 12:20 PM

## 2021-08-27 NOTE — Progress Notes (Signed)
[facility] Daily Progress Note  Subjective:   Objective: Vitals:   08/27/21 0713 08/27/21 1054 08/27/21 1107 08/27/21 1153  BP: 119/64   (!) 111/55  Pulse: (!) 49   (!) 57  Resp: 15   16  Temp: (!) 97.5 F (36.4 C)   97.7 F (36.5 C)  TempSrc:      SpO2: 95% 95% 94% 94%  Weight:      Height:        Intake/Output Summary (Last 24 hours) at 08/27/2021 1214 Last data filed at 08/27/2021 1024 Gross per 24 hour  Intake 390 ml  Output --  Net 390 ml   Physical Exam: A&Ox3, NAD CV: RRR Pulmonary: CTA Bilaterally Abdomen: Soft, Nontender, Nondistended Vascular:  Right lower extremity: Thigh soft.  Calf soft.  Extremity is wrapped in an Ace wrap.  Toes are warm.  Left lower extremity: Thigh soft.  Calf soft.  Extremity is wrapped in an Ace wrap.  Toes are warm.   Laboratory: CBC    Component Value Date/Time   WBC 6.2 08/24/2021 0439   HGB 13.9 08/24/2021 0439   HCT 42.2 08/24/2021 0439   PLT 265 08/24/2021 0439   BMET    Component Value Date/Time   NA 136 08/24/2021 0439   NA 138 02/12/2021 1434   K 3.9 08/24/2021 0439   CL 104 08/24/2021 0439   CO2 25 08/24/2021 0439   GLUCOSE 114 (H) 08/24/2021 0439   BUN 31 (H) 08/24/2021 0439   BUN 14 02/12/2021 1434   CREATININE 0.98 08/24/2021 0439   CALCIUM 8.3 (L) 08/24/2021 0439   GFRNONAA 57 (L) 08/24/2021 0439   GFRAA  10/09/2009 0350    >60        The eGFR has been calculated using the MDRD equation. This calculation has not been validated in all clinical situations. eGFR's persistently <60 mL/min signify possible Chronic Kidney Disease.   Assessment/Planning: The patient is an 87 year old female with chronic swelling to the bilateral lower extremity currently under the care at the wound center at [facility] recently admitted for cellulitis  1) unfortunately, the patient has been noncompliant in managing her edema.  The patient does not engage in conservative  therapy including wearing medical grade one compression socks, unna wraps, elevation and remaining active. 2) I had a long conversation with the patient in regard to the pathophysiology of lymphedema/chronic venous insufficiency and the potential for tissue loss or loss of limb if these are not managed appropriately.  3) patient would like to follow-up with [place] as an outpatient and undergo formal work-up.  I will see her in one week .  ABI completed on August 23, 2021 was notable for:  1. Resting left ankle-brachial index of 0.7 consistent with at least moderate underlying peripheral arterial disease. 2. Resting right ankle-brachial index of 1.0 is normal. However, this finding is felt to be spurious and falsely elevated due to poor compressibility of the vessels given the abnormal underlying arterial waveforms.  Patient may be a candidate for endovascular intervention in regard to her peripheral artery disease in the near future.  Patient is on aspirin, Plavix and statin for medical management.  4) can be discharged home from a vascular standpoint when medically stable and to continue her care as an outpatient  Discussed with Dr. Ellis Parents [facility] PA-C 08/27/2021 12:14 PM

## 2021-08-27 NOTE — Progress Notes (Signed)
Met with the patient and her son, I explained to them that her wounds do not qualify her for SNF STR, and Medicare would not cover it, she will be set up with [facility] RN and PT, for nursing and PT, she walks very well, she will go to Outpatient wound care and has an appointment on Sept 30th at 1230, she understands to take her ins card a alist of meds and wear a mask She wants to have [facility], I filled out the enrollment form and [facility]ed to [facility], Her son will be taking her home today

## 2021-08-27 NOTE — Discharge Summary (Signed)
Physician Discharge Summary  Jenny [name] TGG:[id number] DOB: 02/21/1933 DOA: 08/22/2021  PCP: Kirby Funk, MD  Admit date: 08/22/2021 Discharge date: 08/27/2021 Admitted From: Home Disposition: Home Recommendations for Outpatient Follow-up:  Follow ups as below. Please obtain CBC/BMP/Mag at follow up Please follow up on the following pending results: None Home Health: PT/OT/RN Equipment/Devices: Not indicated Discharge Condition: Stable CODE STATUS: Full code  Follow-up Information     Annice Needy, MD Follow up on 09/05/2021.   Specialties: Vascular Surgery, Radiology, Interventional Cardiology Why: Can see Dew or Vivia Birmingham. Will need bilateral venous duplex for reflux with appointment. May also need unna boots. at [facility] information: [address] [place] [address] [phone]         Kirby Funk, MD. Schedule an appointment as soon as possible for a visit on 09/03/2021.   Specialty: Internal Medicine Why: at 11am Contact information: [address] [place] [address] [phone]                Hospital Course: 87 year old F with PMH of CAD/DES stent in 2010, COPD, chronic venous insufficiency, bilateral carotid artery stenosis, tobacco use disorder, HTN, HLD and Mobitz 1 AVB presenting with bilateral lower extremity edema and erythema, and admitted for possible lower extremity cellulitis in the setting of chronic venous stasis, and possible acute on chronic diastolic CHF.  Afebrile, hemodynamically stable.  No significant finding on basic labs.  BNP elevated to 289.  Started on IV ceftriaxone and IV Lasix.  TTE and ABI ordered.   TTE with LVEF of 60 to 65%, indeterminate DD, RVSP of 65 mmHg, moderate RAE.  Left ABI 0.7 consistent with at least moderate underlying PAD.  Right ABI 1.0 but felt to be spurious and falsely elevated due to poor compressibility.  Patient is already on aspirin, Plavix and Crestor.  Vascular surgery  consulted and felt the problem to be venous, and recommended compression dressing and follow-up in wound care center.   Antibiotics de-escalated to IV Ancef, and she was discharged on p.o. cefadroxil for 5 more days to complete a total of 10 days course.  Patient to have wound care by [facility] RN and family members until her appointment at wound care clinic on 09/07/2021.  See individual problem list below for more on hospital course.  Discharge Diagnoses:  Bilateral lower extremity chronic stasis dermatitis with wounds  Possible cellulitis in the setting of the above. PAD-Left ABI 0.7.  Right ABI 1.0 but spurious and falsely elevated due to poor compressibility.  -IV ceftriaxone>> IV Ancef>> p.o. cefadroxil for 5 more days to complete a total of 10 days course -Continue aspirin, Plavix and statin. -Encouraged tobacco cessation-but she is very reluctant about this -Family and HH RN to assist with dressing change and wound care -Outpatient follow-up with PCP and vascular surgery as above   Possible COPD exacerbation-presented with shortness of breath, wheezing and cough.  Likely due to ongoing cigarette smoking.  Respiratory symptoms improved.  Did not require systemic steroid. -Discharged on Trelegy Ellipta and as needed DuoNeb -Antibiotics as above.   Acute on chronic diastolic CHF/PAH: TTE with LVEF of 60 to 65%, indeterminate DD and RVSP of 65 mmHg.  CXR with cardiomegaly and vascular congestion.  BNP slightly elevated.  Started on IV Lasix with improvement in her breathing.  Transitioned to p.o. Lasix.  Good urine output. -Continue home p.o. Lasix 40 mg daily   History of CAD/DES stent in 2010-denies chest pain. -Continue Toprol-XL, aspirin,  Plavix and statin   Essential hypertension: BP within fair range. -Continue home medication as below   Hyperlipidemia -Continue home Crestor.   Carotid artery disease: Mild bilateral carotid disease by Doppler in 2020 -Continue home aspirin, Plavix  and statin   History of Mobitz type I AV block: EKG with A. fib.   Persistent atrial fibrillation: Rate controlled.  Not on anticoagulation.  Patient will be high risk for bleeding with anticoagulation and DAPT.  Given the situation, she would benefit more from DAPT.  -Continue home Toprol-XL as above   Tobacco use disorder-1 pack/day, started smoking at the age of 87. -Not ready for tobacco cessation.  She declined meds as well.     Class II obesity Body mass index is 37.22 kg/m. Nutrition Problem: Increased nutrient needs Etiology: wound healing Signs/Symptoms: estimated needs       Discharge Exam: Vitals:   08/27/21 1054 08/27/21 1107 08/27/21 1153 08/27/21 1547  BP:   (!) 111/55 (!) 143/56  Pulse:   (!) 57 (!) 54  Temp:   97.7 F (36.5 C) 98 F (36.7 C)  Resp:   16 15  Height:      Weight:      SpO2: 95% 94% 94% 95%  TempSrc:      BMI (Calculated):         GENERAL: No apparent distress.  Nontoxic. HEENT: MMM.  Vision and hearing grossly intact.  NECK: Supple.  No apparent JVD.  RESP:  No IWOB.  Fair aeration bilaterally. CVS:  RRR. Heart sounds normal.  ABD/GI/GU: Bowel sounds present. Soft. Non tender.  MSK/EXT:  Moves extremities.  Dressing and Ace wrap over BLE.   SKIN: Dressing and Ace wrap over BLE.  No erythema or swelling proximally NEURO: Awake and alert.  Oriented appropriately.  No apparent focal neuro deficit. PSYCH: Calm. Normal affect.   Discharge Instructions  Discharge Instructions     AMB referral to wound care center   Complete by: As directed    Call MD for:  difficulty breathing, headache or visual disturbances   Complete by: As directed    Call MD for:  extreme fatigue   Complete by: As directed    Call MD for:  persistant dizziness or light-headedness   Complete by: As directed    Call MD for:  redness, tenderness, or signs of infection (pain, swelling, redness, odor or green/yellow discharge around incision site)   Complete by: As  directed    Call MD for:  severe uncontrolled pain   Complete by: As directed    Call MD for:  temperature >100.4   Complete by: As directed    Diet - low sodium heart healthy   Complete by: As directed    Discharge instructions   Complete by: As directed    It has been a pleasure taking care of you!  You were hospitalized due to lower extremity ulceration and redness likely from poor venous circulation and possible infection.  This has improved with antibiotics and dressing change.  We are discharging you more antibiotics to complete treatment course for possible infection.  It is very important that you get daily dressing.  We placed a referral to wound clinic.  We also recommend you quit smoking cigarettes which could help with circulation and wound healing.  Please follow-up with your primary care doctor in 1 to 2 weeks or sooner if needed.  Review your new medication list and the directions on your medications before you take them.  Tylenol #3 could impair your concentration and balance.  We recommend not driving or perform activities that requires focus and concentration after taking this medication   Take care,   Discharge wound care:   Complete by: As directed    Generously wash BLE with warm, soapy water. Pat dry. Apply Sween Moisturizing ointment (pink and white tube in clean utility) to intact skin. Place as many Aquacel dressings [place] [phone]) over the open, weeping areas of the legs as needed to cover the sites, then ABD pads. Beginning behind the toes and going to just below the knees, spiral wrap kerlix and 4 inch ace wraps. Change daily. Moisten the Aquacel, if needed, to remove the old aquacel when changing the dressings.   Increase activity slowly   Complete by: As directed       Allergies as of 08/27/2021       Reactions   Lactose Intolerance (gi)    Atorvastatin Itching   Itching and myaliga   Meperidine Hcl Other (See Comments)   Not known   Pravastatin Rash    rash        Medication List     STOP taking these medications    amLODipine 5 MG tablet Commonly known as: NORVASC   Anoro Ellipta 62.5-25 MCG/INH Aepb Generic drug: umeclidinium-vilanterol   potassium chloride SA 20 MEQ tablet Commonly known as: KLOR-CON       TAKE these medications    acetaminophen-codeine 300-30 MG tablet Commonly known as: TYLENOL #3 Take 1 tablet by mouth every 6 (six) hours as needed for up to 5 days for moderate pain or severe pain.   ASPIRIN ADULT PO Take 81 mg by mouth daily.   cefadroxil 500 MG capsule Commonly known as: DURICEF Take 1 capsule (500 mg total) by mouth 2 (two) times daily.   clopidogrel 75 MG tablet Commonly known as: PLAVIX TAKE ONE TABLET EVERY DAY   dorzolamide 2 % ophthalmic solution Commonly known as: TRUSOPT Place 1 drop into both eyes 2 (two) times daily.   famotidine 20 MG tablet Commonly known as: PEPCID Take 20 mg by mouth 2 (two) times daily.   furosemide 40 MG tablet Commonly known as: LASIX Take 40 mg by mouth as needed for fluid.   gabapentin 100 MG capsule Commonly known as: NEURONTIN Take 100-200 mg by mouth 2 (two) times daily. Pt takes one capsule every am, two capsules every pm   ipratropium-albuterol 0.5-2.5 (3) MG/3ML Soln Commonly known as: DUONEB Take 3 mLs by nebulization every 6 (six) hours as needed (Shortness of breath, cough and/or wheeze).   latanoprost 0.005 % ophthalmic solution Commonly known as: XALATAN Place 1 drop into both eyes in the morning.   losartan 50 MG tablet Commonly known as: COZAAR Take 50 mg by mouth daily. What changed: Another medication with the same name was removed. Continue taking this medication, and follow the directions you see here.   magnesium hydroxide 400 MG/5ML suspension Commonly known as: MILK OF MAGNESIA Take 30 mLs by mouth as needed for mild constipation.   metoprolol succinate 25 MG 24 hr tablet Commonly known as: TOPROL-XL Take 1  tablet (25 mg total) by mouth daily. Start taking on: August 28, 2021 What changed:  medication strength how much to take additional instructions   Nitrostat 0.4 MG SL tablet Generic drug: nitroGLYCERIN DISSOLVE ONE TABLET UNDER TONGUE AS NEEDED FOR CHEST PAIN - MAY REPEAT TWICE-IF NO RELIEF GO TO NEAREST HOSPITAL ER   potassium chloride 10  MEQ tablet Commonly known as: KLOR-CON Take 10 mEq by mouth 2 (two) times daily.   rosuvastatin 20 MG tablet Commonly known as: CRESTOR Take 20 mg by mouth daily.   Trelegy Ellipta 100-62.5-25 MCG/INH Aepb Generic drug: Fluticasone-Umeclidin-Vilant Inhale 1 puff into the lungs daily.   Ventolin HFA 108 (90 Base) MCG/ACT inhaler Generic drug: albuterol INHALE ONE PUFF EVERY 6 HOURS AS NEEDED FOR SHORTNESS OF BREATH               Discharge Care Instructions  (From admission, onward)           Start     Ordered   08/27/21 0000  Discharge wound care:       Comments: Generously wash BLE with warm, soapy water. Pat dry. Apply Sween Moisturizing ointment (pink and white tube in clean utility) to intact skin. Place as many Aquacel dressings [place] [phone]) over the open, weeping areas of the legs as needed to cover the sites, then ABD pads. Beginning behind the toes and going to just below the knees, spiral wrap kerlix and 4 inch ace wraps. Change daily. Moisten the Aquacel, if needed, to remove the old aquacel when changing the dressings.   08/27/21 1255            Consultations: Vascular surgery  Procedures/Studies:   DG Chest 2 View  Result Date: 08/22/2021 CLINICAL DATA:  Shortness of breath EXAM: CHEST - 2 VIEW COMPARISON:  Chest radiograph 07/07/2009 FINDINGS: The heart is enlarged, progressed since 2010. There is calcified atherosclerotic plaque of the aortic arch. The mediastinal contours are within normal limits. There is vascular congestion without overt pulmonary edema. There is a trace right pleural effusion with  adjacent atelectasis. Otherwise, there is no focal consolidation. There is no left pleural effusion. There is no pneumothorax. There is no acute osseous abnormality. IMPRESSION: 1. Cardiomegaly, progressed since 20/10, with vascular congestion but no overt pulmonary edema. 2. Trace right pleural effusion with adjacent atelectasis. Electronically Signed   By: Lesia Hausen M.D.   On: 08/22/2021 11:54   US ARTERIAL ABI (SCREENING LOWER EXTREMITY)  Result Date: 08/23/2021 CLINICAL DATA:  Peripheral vascular disease EXAM: NONINVASIVE PHYSIOLOGIC VASCULAR STUDY OF BILATERAL LOWER EXTREMITIES TECHNIQUE: Evaluation of both lower extremities were performed at rest, including calculation of ankle-brachial indices with single level Doppler, pressure and pulse volume recording. COMPARISON:  None. FINDINGS: Right ABI:  1.0 Left ABI:  0.7 Right Lower Extremity:  Abnormal monophasic arterial waveforms. Left Lower Extremity:  Abnormal monophasic arterial waveforms. 0.5-0.79 Moderate PAD IMPRESSION: 1. Resting left ankle-brachial index of 0.7 consistent with at least moderate underlying peripheral arterial disease. 2. Resting right ankle-brachial index of 1.0 is normal. However, this finding is felt to be spurious and falsely elevated due to poor compressibility of the vessels given the abnormal underlying arterial waveforms. Signed, Sterling Big, MD, RPVI [facility] Electronically Signed   By: Malachy Moan M.D.   On: 08/23/2021 09:59   ECHOCARDIOGRAM COMPLETE  Result Date: 08/23/2021    ECHOCARDIOGRAM REPORT   Patient Name:   KADIN BERA Date of Exam: 08/22/2021 Medical Rec #:  [id number]     Height:       62.0 in Accession #:    [id number]    Weight:       209.0 lb Date of Birth:  [birth year]     BSA:          1.948 m Patient Age:  87 years      BP:           131/80 mmHg Patient Gender: F             HR:           60 bpm. Exam Location:  [facility]  Procedure: 2D Echo, Cardiac Doppler and Color Doppler Indications:     CHF-acute diastolic I50.31  History:         Patient has no prior history of Echocardiogram examinations.                  Previous Myocardial Infarction; Risk Factors:Diabetes and                  Hypertension. Tobacco abuse.  Sonographer:     Chazlynn Caamano RDCS Referring Phys:  [id number] Oliver Pila HALL Diagnosing Phys: Julien Nordmann MD IMPRESSIONS  1. Left ventricular ejection fraction, by estimation, is 60 to 65%. The left ventricle has normal function. The left ventricle has no regional wall motion abnormalities. There is mild left ventricular hypertrophy. Left ventricular diastolic parameters are indeterminate.  2. Right ventricular systolic function is normal. The right ventricular size is normal. There is severely elevated pulmonary artery systolic pressure. The estimated right ventricular systolic pressure is 65.1 mmHg.  3. Left atrial size was mildly dilated.  4. Right atrial size was moderately dilated.  5. The mitral valve is normal in structure. Mild mitral valve regurgitation. Mild mitral stenosis.  6. The inferior vena cava is dilated in size with <50% respiratory variability, suggesting right atrial pressure of 15 mmHg. FINDINGS  Left Ventricle: Left ventricular ejection fraction, by estimation, is 60 to 65%. The left ventricle has normal function. The left ventricle has no regional wall motion abnormalities. The left ventricular internal cavity size was normal in size. There is  mild left ventricular hypertrophy. Left ventricular diastolic parameters are indeterminate. Right Ventricle: The right ventricular size is normal. No increase in right ventricular wall thickness. Right ventricular systolic function is normal. There is severely elevated pulmonary artery systolic pressure. The tricuspid regurgitant velocity is 3.54 m/s, and with an assumed right atrial pressure of 15 mmHg, the estimated right ventricular systolic  pressure is 65.1 mmHg. Left Atrium: Left atrial size was mildly dilated. Right Atrium: Right atrial size was moderately dilated. Pericardium: There is no evidence of pericardial effusion. Mitral Valve: The mitral valve is normal in structure. Mild mitral annular calcification. Mild mitral valve regurgitation. Mild mitral valve stenosis. MV peak gradient, 19.1 mmHg. The mean mitral valve gradient is 8.0 mmHg. Tricuspid Valve: The tricuspid valve is normal in structure. Tricuspid valve regurgitation is mild . No evidence of tricuspid stenosis. Aortic Valve: The aortic valve is normal in structure. Aortic valve regurgitation is not visualized. Mild aortic valve sclerosis is present, with no evidence of aortic valve stenosis. Pulmonic Valve: The pulmonic valve was normal in structure. Pulmonic valve regurgitation is not visualized. No evidence of pulmonic stenosis. Aorta: The aortic root is normal in size and structure. Venous: The inferior vena cava is dilated in size with less than 50% respiratory variability, suggesting right atrial pressure of 15 mmHg. IAS/Shunts: No atrial level shunt detected by color flow Doppler.  LEFT VENTRICLE PLAX 2D LVIDd:         4.14 cm  Diastology LVIDs:         2.21 cm  LV e' medial:    9.90 cm/s LV PW:         1.23  cm  LV E/e' medial:  20.5 LV IVS:        1.19 cm  LV e' lateral:   8.49 cm/s LVOT diam:     1.70 cm  LV E/e' lateral: 23.9 LV SV:         67 LV SV Index:   35 LVOT Area:     2.27 cm  RIGHT VENTRICLE             IVC RV Basal diam:  4.19 cm     IVC diam: 2.39 cm RV S prime:     10.65 cm/s TAPSE (M-mode): 2.2 cm LEFT ATRIUM             Index       RIGHT ATRIUM           Index LA diam:        4.50 cm 2.31 cm/m  RA Area:     27.60 cm LA Vol (A2C):   65.1 ml 33.43 ml/m RA Volume:   103.00 ml 52.89 ml/m LA Vol (A4C):   63.8 ml 32.76 ml/m LA Biplane Vol: 65.8 ml 33.78 ml/m  AORTIC VALVE LVOT Vmax:   150.00 cm/s LVOT Vmean:  107.000 cm/s LVOT VTI:    0.297 m  AORTA Ao Root  diam: 3.60 cm Ao Asc diam:  3.20 cm MITRAL VALVE                TRICUSPID VALVE MV Area (PHT): 2.13 cm     TR Peak grad:   50.1 mmHg MV Area VTI:   1.14 cm     TR Vmax:        354.00 cm/s MV Peak grad:  19.1 mmHg MV Mean grad:  8.0 mmHg     SHUNTS MV Vmax:       2.18 m/s     Systemic VTI:  0.30 m MV Vmean:      126.0 cm/s   Systemic Diam: 1.70 cm MV Decel Time: 356 msec MV E velocity: 203.00 cm/s MV A velocity: 81.50 cm/s MV E/A ratio:  2.49 Julien Nordmann MD Electronically signed by Julien Nordmann MD Signature Date/Time: 08/23/2021/10:31:28 AM    Final        The results of significant diagnostics from this hospitalization (including imaging, microbiology, ancillary and laboratory) are listed below for reference.     Microbiology: Recent Results (from the past 240 hour(s))  CULTURE, BLOOD (ROUTINE X 2) w Reflex to ID Panel     Status: None   Collection Time: 08/22/21 11:20 AM   Specimen: BLOOD  Result Value Ref Range Status   Specimen Description BLOOD RIGHT ANTECUBITAL  Final   Special Requests   Final    BOTTLES DRAWN AEROBIC AND ANAEROBIC Blood Culture results may not be optimal due to an excessive volume of blood received in culture bottles   Culture   Final    NO GROWTH 5 DAYS Performed at [facility], [address], [place], [place] [address]    Report Status 08/27/2021 FINAL  Final  SARS CORONAVIRUS 2 (TAT 6-24 HRS) Nasopharyngeal Nasopharyngeal Swab     Status: None   Collection Time: 08/22/21 12:24 PM   Specimen: Nasopharyngeal Swab  Result Value Ref Range Status   SARS Coronavirus 2 NEGATIVE NEGATIVE Final    Comment: (NOTE) SARS-CoV-2 target nucleic acids are NOT DETECTED.  The SARS-CoV-2 RNA is generally detectable in upper and lower respiratory specimens during the acute phase of infection. Negative results do  not preclude SARS-CoV-2 infection, do not rule out co-infections with other pathogens, and should not be used as the sole basis for treatment or  other patient management decisions. Negative results must be combined with clinical observations, patient history, and epidemiological information. The expected result is Negative.  Fact Sheet for Patients: HairSlick.no  Fact Sheet for Healthcare Providers: quierodirigir.com  This test is not yet approved or cleared by the [place] FDA and  has been authorized for detection and/or diagnosis of SARS-CoV-2 by FDA under an Emergency Use Authorization (EUA). This EUA will remain  in effect (meaning this test can be used) for the duration of the COVID-19 declaration under Se ction 564(b)(1) of the Act, 21 U.S.C. section 360bbb-3(b)(1), unless the authorization is terminated or revoked sooner.  Performed at [facility], [address], [place], [place] [address]   CULTURE, BLOOD (ROUTINE X 2) w Reflex to ID Panel     Status: None   Collection Time: 08/22/21  4:21 PM   Specimen: BLOOD  Result Value Ref Range Status   Specimen Description BLOOD RIGHT ANTECUBITAL  Final   Special Requests   Final    BOTTLES DRAWN AEROBIC AND ANAEROBIC Blood Culture adequate volume   Culture   Final    NO GROWTH 5 DAYS Performed at [facility], [address] [place], [place] [address]    Report Status 08/27/2021 FINAL  Final     Labs:  CBC: Recent Labs  Lab 08/22/21 1120 08/23/21 0627 08/24/21 0439  WBC 6.0 6.9 6.2  HGB 14.7 14.0 13.9  HCT 46.2* 42.9 42.2  MCV 100.9* 99.1 96.3  PLT 240 263 265   BMP &GFR Recent Labs  Lab 08/22/21 1120 08/23/21 0627 08/24/21 0439  NA 141 138 136  K 4.0 4.3 3.9  CL 105 104 104  CO2 26 24 25   GLUCOSE 139* 114* 114*  BUN 24* 23 31*  CREATININE 0.99 0.90 0.98  CALCIUM 8.1* 8.2* 8.3*  MG  --  2.2 2.1  PHOS  --   --  4.0   Estimated Creatinine Clearance: 44.4 mL/min (by C-G formula based on SCr of 0.98 mg/dL). Liver & Pancreas: Recent Labs  Lab 08/23/21 0627  08/24/21 0439  AST 20  --   ALT 24  --   ALKPHOS 105  --   BILITOT 0.7  --   PROT 6.5  --   ALBUMIN 3.1* 3.0*   No results for input(s): LIPASE, AMYLASE in the last 168 hours. No results for input(s): AMMONIA in the last 168 hours. Diabetic: No results for input(s): HGBA1C in the last 72 hours. No results for input(s): GLUCAP in the last 168 hours. Cardiac Enzymes: No results for input(s): CKTOTAL, CKMB, CKMBINDEX, TROPONINI in the last 168 hours. No results for input(s): PROBNP in the last 8760 hours. Coagulation Profile: No results for input(s): INR, PROTIME in the last 168 hours. Thyroid Function Tests: No results for input(s): TSH, T4TOTAL, FREET4, T3FREE, THYROIDAB in the last 72 hours. Lipid Profile: No results for input(s): CHOL, HDL, LDLCALC, TRIG, CHOLHDL, LDLDIRECT in the last 72 hours. Anemia Panel: No results for input(s): VITAMINB12, FOLATE, FERRITIN, TIBC, IRON, RETICCTPCT in the last 72 hours. Urine analysis: No results found for: COLORURINE, APPEARANCEUR, LABSPEC, PHURINE, GLUCOSEU, HGBUR, BILIRUBINUR, KETONESUR, PROTEINUR, UROBILINOGEN, NITRITE, LEUKOCYTESUR Sepsis Labs: Invalid input(s): PROCALCITONIN, LACTICIDVEN   Time coordinating discharge: 45 minutes  SIGNED:  08/26/21, MD  [facility] 08/27/2021, 10:23 PM

## 2021-08-28 DIAGNOSIS — I872 Venous insufficiency (chronic) (peripheral): Secondary | ICD-10-CM | POA: Diagnosis not present

## 2021-08-28 DIAGNOSIS — L03115 Cellulitis of right lower limb: Secondary | ICD-10-CM | POA: Diagnosis not present

## 2021-08-28 DIAGNOSIS — Z7982 Long term (current) use of aspirin: Secondary | ICD-10-CM | POA: Diagnosis not present

## 2021-08-28 DIAGNOSIS — L97819 Non-pressure chronic ulcer of other part of right lower leg with unspecified severity: Secondary | ICD-10-CM | POA: Diagnosis not present

## 2021-08-28 DIAGNOSIS — I252 Old myocardial infarction: Secondary | ICD-10-CM | POA: Diagnosis not present

## 2021-08-28 DIAGNOSIS — I6523 Occlusion and stenosis of bilateral carotid arteries: Secondary | ICD-10-CM | POA: Diagnosis not present

## 2021-08-28 DIAGNOSIS — Z7902 Long term (current) use of antithrombotics/antiplatelets: Secondary | ICD-10-CM | POA: Diagnosis not present

## 2021-08-28 DIAGNOSIS — I2510 Atherosclerotic heart disease of native coronary artery without angina pectoris: Secondary | ICD-10-CM | POA: Diagnosis not present

## 2021-08-28 DIAGNOSIS — Z9181 History of falling: Secondary | ICD-10-CM | POA: Diagnosis not present

## 2021-08-28 DIAGNOSIS — L97829 Non-pressure chronic ulcer of other part of left lower leg with unspecified severity: Secondary | ICD-10-CM | POA: Diagnosis not present

## 2021-08-28 DIAGNOSIS — I110 Hypertensive heart disease with heart failure: Secondary | ICD-10-CM | POA: Diagnosis not present

## 2021-08-28 DIAGNOSIS — F17210 Nicotine dependence, cigarettes, uncomplicated: Secondary | ICD-10-CM | POA: Diagnosis not present

## 2021-08-28 DIAGNOSIS — E785 Hyperlipidemia, unspecified: Secondary | ICD-10-CM | POA: Diagnosis not present

## 2021-08-28 DIAGNOSIS — I4819 Other persistent atrial fibrillation: Secondary | ICD-10-CM | POA: Diagnosis not present

## 2021-08-28 DIAGNOSIS — E1151 Type 2 diabetes mellitus with diabetic peripheral angiopathy without gangrene: Secondary | ICD-10-CM | POA: Diagnosis not present

## 2021-08-28 DIAGNOSIS — E669 Obesity, unspecified: Secondary | ICD-10-CM | POA: Diagnosis not present

## 2021-08-28 DIAGNOSIS — I5033 Acute on chronic diastolic (congestive) heart failure: Secondary | ICD-10-CM | POA: Diagnosis not present

## 2021-08-28 DIAGNOSIS — Z6837 Body mass index (BMI) 37.0-37.9, adult: Secondary | ICD-10-CM | POA: Diagnosis not present

## 2021-08-28 DIAGNOSIS — H409 Unspecified glaucoma: Secondary | ICD-10-CM | POA: Diagnosis not present

## 2021-08-28 DIAGNOSIS — J449 Chronic obstructive pulmonary disease, unspecified: Secondary | ICD-10-CM | POA: Diagnosis not present

## 2021-08-28 DIAGNOSIS — J441 Chronic obstructive pulmonary disease with (acute) exacerbation: Secondary | ICD-10-CM | POA: Diagnosis not present

## 2021-08-28 DIAGNOSIS — L03116 Cellulitis of left lower limb: Secondary | ICD-10-CM | POA: Diagnosis not present

## 2021-08-29 DIAGNOSIS — L03116 Cellulitis of left lower limb: Secondary | ICD-10-CM | POA: Diagnosis not present

## 2021-08-29 DIAGNOSIS — I2510 Atherosclerotic heart disease of native coronary artery without angina pectoris: Secondary | ICD-10-CM | POA: Diagnosis not present

## 2021-08-29 DIAGNOSIS — J449 Chronic obstructive pulmonary disease, unspecified: Secondary | ICD-10-CM | POA: Diagnosis not present

## 2021-08-29 DIAGNOSIS — I272 Pulmonary hypertension, unspecified: Secondary | ICD-10-CM | POA: Diagnosis not present

## 2021-08-29 DIAGNOSIS — I872 Venous insufficiency (chronic) (peripheral): Secondary | ICD-10-CM | POA: Diagnosis not present

## 2021-08-29 DIAGNOSIS — L97819 Non-pressure chronic ulcer of other part of right lower leg with unspecified severity: Secondary | ICD-10-CM | POA: Diagnosis not present

## 2021-08-29 DIAGNOSIS — E1151 Type 2 diabetes mellitus with diabetic peripheral angiopathy without gangrene: Secondary | ICD-10-CM | POA: Diagnosis not present

## 2021-08-29 DIAGNOSIS — H409 Unspecified glaucoma: Secondary | ICD-10-CM | POA: Diagnosis not present

## 2021-08-29 DIAGNOSIS — L03115 Cellulitis of right lower limb: Secondary | ICD-10-CM | POA: Diagnosis not present

## 2021-08-29 DIAGNOSIS — H402224 Chronic angle-closure glaucoma, left eye, indeterminate stage: Secondary | ICD-10-CM | POA: Diagnosis not present

## 2021-08-29 DIAGNOSIS — I10 Essential (primary) hypertension: Secondary | ICD-10-CM | POA: Diagnosis not present

## 2021-08-29 DIAGNOSIS — E782 Mixed hyperlipidemia: Secondary | ICD-10-CM | POA: Diagnosis not present

## 2021-08-29 DIAGNOSIS — L97829 Non-pressure chronic ulcer of other part of left lower leg with unspecified severity: Secondary | ICD-10-CM | POA: Diagnosis not present

## 2021-08-29 DIAGNOSIS — M8580 Other specified disorders of bone density and structure, unspecified site: Secondary | ICD-10-CM | POA: Diagnosis not present

## 2021-08-30 DIAGNOSIS — L03116 Cellulitis of left lower limb: Secondary | ICD-10-CM | POA: Diagnosis not present

## 2021-08-30 DIAGNOSIS — L97829 Non-pressure chronic ulcer of other part of left lower leg with unspecified severity: Secondary | ICD-10-CM | POA: Diagnosis not present

## 2021-08-30 DIAGNOSIS — I872 Venous insufficiency (chronic) (peripheral): Secondary | ICD-10-CM | POA: Diagnosis not present

## 2021-08-30 DIAGNOSIS — E1151 Type 2 diabetes mellitus with diabetic peripheral angiopathy without gangrene: Secondary | ICD-10-CM | POA: Diagnosis not present

## 2021-08-30 DIAGNOSIS — L03115 Cellulitis of right lower limb: Secondary | ICD-10-CM | POA: Diagnosis not present

## 2021-08-30 DIAGNOSIS — L97819 Non-pressure chronic ulcer of other part of right lower leg with unspecified severity: Secondary | ICD-10-CM | POA: Diagnosis not present

## 2021-09-03 ENCOUNTER — Other Ambulatory Visit (INDEPENDENT_AMBULATORY_CARE_PROVIDER_SITE_OTHER): Payer: Self-pay | Admitting: Nurse Practitioner

## 2021-09-03 ENCOUNTER — Telehealth: Payer: Self-pay

## 2021-09-03 DIAGNOSIS — L03115 Cellulitis of right lower limb: Secondary | ICD-10-CM | POA: Diagnosis not present

## 2021-09-03 DIAGNOSIS — I272 Pulmonary hypertension, unspecified: Secondary | ICD-10-CM | POA: Diagnosis not present

## 2021-09-03 DIAGNOSIS — I5030 Unspecified diastolic (congestive) heart failure: Secondary | ICD-10-CM | POA: Diagnosis not present

## 2021-09-03 DIAGNOSIS — M7989 Other specified soft tissue disorders: Secondary | ICD-10-CM

## 2021-09-03 DIAGNOSIS — L03116 Cellulitis of left lower limb: Secondary | ICD-10-CM | POA: Diagnosis not present

## 2021-09-03 DIAGNOSIS — Z23 Encounter for immunization: Secondary | ICD-10-CM | POA: Diagnosis not present

## 2021-09-03 DIAGNOSIS — J449 Chronic obstructive pulmonary disease, unspecified: Secondary | ICD-10-CM | POA: Diagnosis not present

## 2021-09-03 NOTE — Telephone Encounter (Signed)
   Jenny [name] DOB: 1933-08-06 MRN: [id number]   RIDER WAIVER AND RELEASE OF LIABILITY  For purposes of improving physical access to our facilities, [facility] is pleased to partner with third parties to provide [facility] patients or other authorized individuals the option of convenient, on-demand ground transportation services (the Chiropractor") through use of the technology service that enables users to request on-demand ground transportation from independent third-party providers.  By opting to use and accept these [employer], I, the undersigned, hereby agree on behalf of myself, and on behalf of any minor child using the Science writer for whom I am the parent or legal guardian, as follows:  Science writer provided to me are provided by independent third-party transportation providers who are not [employer] or employees and who are unaffiliated with [employer]. [facility] is neither a transportation carrier nor a common or public carrier. [facility] has no control over the quality or safety of the transportation that occurs as a result of the [employer]. [facility] cannot guarantee that any third-party transportation provider will complete any arranged transportation service. [facility] makes no representation, warranty, or guarantee regarding the reliability, timeliness, quality, safety, suitability, or availability of any of the Transport Services or that they will be error free. I fully understand that traveling by vehicle involves risks and dangers of serious bodily injury, including permanent disability, paralysis, and death. I agree, on behalf of myself and on behalf of any minor child using the Transport Services for whom I am the parent or legal guardian, that the entire risk arising out of my use of the [employer] remains solely with me, to the maximum extent permitted under applicable law. The [employer] are provided "as  is" and "as available." [facility] disclaims all representations and warranties, express, implied or statutory, not expressly set out in these terms, including the implied warranties of merchantability and fitness for a particular purpose. I hereby waive and release [facility], its agents, employees, officers, directors, representatives, insurers, attorneys, assigns, successors, subsidiaries, and affiliates from any and all past, present, or future claims, demands, liabilities, actions, causes of action, or suits of any kind directly or indirectly arising from acceptance and use of the [employer]. I further waive and release  and its affiliates from all present and future liability and responsibility for any injury or death to persons or damages to property caused by or related to the use of the [employer]. I have read this Waiver and Release of Liability, and I understand the terms used in it and their legal significance. This Waiver is freely and voluntarily given with the understanding that my right (as well as the right of any minor child for whom I am the parent or legal guardian using the [employer]) to legal recourse against  in connection with the [employer] is knowingly surrendered in return for use of these services.   I attest that I read the consent document to [name], gave Ms. Dondlinger the opportunity to ask questions and answered the questions asked (if any). I affirm that [name] then provided consent for she's participation in this program.     Pt declined waiver

## 2021-09-04 DIAGNOSIS — L03116 Cellulitis of left lower limb: Secondary | ICD-10-CM | POA: Diagnosis not present

## 2021-09-04 DIAGNOSIS — I872 Venous insufficiency (chronic) (peripheral): Secondary | ICD-10-CM | POA: Diagnosis not present

## 2021-09-04 DIAGNOSIS — L97829 Non-pressure chronic ulcer of other part of left lower leg with unspecified severity: Secondary | ICD-10-CM | POA: Diagnosis not present

## 2021-09-04 DIAGNOSIS — L03115 Cellulitis of right lower limb: Secondary | ICD-10-CM | POA: Diagnosis not present

## 2021-09-04 DIAGNOSIS — L97819 Non-pressure chronic ulcer of other part of right lower leg with unspecified severity: Secondary | ICD-10-CM | POA: Diagnosis not present

## 2021-09-04 DIAGNOSIS — E1151 Type 2 diabetes mellitus with diabetic peripheral angiopathy without gangrene: Secondary | ICD-10-CM | POA: Diagnosis not present

## 2021-09-05 ENCOUNTER — Ambulatory Visit (INDEPENDENT_AMBULATORY_CARE_PROVIDER_SITE_OTHER): Payer: Medicare Other

## 2021-09-05 ENCOUNTER — Encounter (INDEPENDENT_AMBULATORY_CARE_PROVIDER_SITE_OTHER): Payer: Self-pay | Admitting: Nurse Practitioner

## 2021-09-05 ENCOUNTER — Other Ambulatory Visit: Payer: Self-pay

## 2021-09-05 ENCOUNTER — Ambulatory Visit (INDEPENDENT_AMBULATORY_CARE_PROVIDER_SITE_OTHER): Payer: Medicare Other | Admitting: Nurse Practitioner

## 2021-09-05 VITALS — BP 144/71 | HR 67 | Resp 16 | Wt 194.8 lb

## 2021-09-05 DIAGNOSIS — F17200 Nicotine dependence, unspecified, uncomplicated: Secondary | ICD-10-CM

## 2021-09-05 DIAGNOSIS — M7989 Other specified soft tissue disorders: Secondary | ICD-10-CM | POA: Diagnosis not present

## 2021-09-05 DIAGNOSIS — E119 Type 2 diabetes mellitus without complications: Secondary | ICD-10-CM

## 2021-09-05 MED ORDER — CEFADROXIL 500 MG PO CAPS: 500.0000 mg | ORAL_CAPSULE | Freq: Two times a day (BID) | ORAL | 0 refills | Status: DC

## 2021-09-07 ENCOUNTER — Ambulatory Visit: Payer: Medicare Other | Admitting: Physician Assistant

## 2021-09-09 ENCOUNTER — Encounter (INDEPENDENT_AMBULATORY_CARE_PROVIDER_SITE_OTHER): Payer: Self-pay | Admitting: Nurse Practitioner

## 2021-09-09 NOTE — Progress Notes (Signed)
Subjective:    Patient ID: Jenny [name], female    DOB: [birth year], 87 y.o.   MRN: [id number] Chief Complaint  Patient presents with  . Follow-up    [facility] 1wk post ed admit cellulitis     Patient is seen for evaluation of leg swelling. The patient first noticed the swelling remotely but is now concerned because of a significant increase in the overall edema. The swelling is associated with pain and discoloration. The patient notes that in the morning the legs are significantly improved but they steadily worsened throughout the course of the day. Elevation makes the legs better, dependency makes them much worse.   There is no history of ulcerations associated with the swelling.   The patient denies any recent changes in their medications.  The patient has not been wearing graduated compression.  The patient has no had any past angiography, interventions or vascular surgery.  The patient denies a history of DVT or PE. There is no prior history of phlebitis. There is no history of primary lymphedema.  There is no history of radiation treatment to the groin or pelvis No history of malignancies. No history of trauma or groin or pelvic surgery. No history of foreign travel or parasitic infections area    Noninvasive studies show no evidence of DVT or superficial thrombophlebitis bilaterally no evidence of superficial venous reflux or deep venous insufficiency seen bilaterally.   Review of Systems  Cardiovascular:  Positive for leg swelling.  Skin:  Positive for wound.  All other systems reviewed and are negative.     Objective:   Physical Exam Vitals reviewed.  Constitutional:      Appearance: She is obese.  HENT:     Head: Normocephalic.  Cardiovascular:     Rate and Rhythm: Normal rate.     Pulses: Decreased pulses.  Pulmonary:     Effort: Pulmonary effort is normal.  Musculoskeletal:     Right lower leg: 2+ Edema present.     Left lower leg: 2+ Edema present.   Neurological:     Mental Status: She is alert and oriented to person, place, and time.  Psychiatric:        Mood and Affect: Mood normal.        Behavior: Behavior normal.        Thought Content: Thought content normal.        Judgment: Judgment normal.    BP (!) 144/71 (BP Location: Right Arm)   Pulse 67   Resp 16   Wt 194 lb 12.8 oz (88.4 kg)   BMI 35.63 kg/m   Past Medical History:  Diagnosis Date  . ACUT DUOD ULCER W/HEMORR&PERF W/O MENTION OBST 10/05/2009   NSAID induced  . ALLERGIC RHINITIS CAUSE UNSPECIFIED   . ANEMIA-NOS   . CAD (coronary artery disease) 06/2009   DEs RCA with 70% LAD and EF 60%  . COPD    mild obst on PFTs 03/2010  . Diabetes mellitus 06/2010 dx   Mild, diet controlled  . GLAUCOMA   . HYPERLIPIDEMIA   . HYPERTENSION, BENIGN   . MYOCARDIAL INFARCTION 06/2009   des to rca  . TOBACCO ABUSE     Social History   Socioeconomic History  . Marital status: Widowed    Spouse name: Not on file  . Number of children: Not on file  . Years of education: Not on file  . Highest education level: Not on file  Occupational History  . Not on file  Tobacco Use  . Smoking status: Every Day    Packs/day: 0.50    Years: 60.00    Pack years: 30.00    Types: Cigarettes  . Smokeless tobacco: Never  . Tobacco comments:    She lives in [place] with her significant other (Charles Hook)0  Vaping Use  . Vaping Use: Never used  Substance and Sexual Activity  . Alcohol use: Yes    Comment: Occassional  . Drug use: No  . Sexual activity: Not on file  Other Topics Concern  . Not on file  Social History Narrative   Lives in [place] with her SO - charles hook   Retired -Catering manager   Enjoys travel - live Photographer   Social Determinants of Radio broadcast assistant Strain: Not on [employer] Insecurity: Not on file  Transportation Needs: Not on file  Physical Activity: Not on file  Stress: Not on file  Social Connections: Not on file   Intimate Partner Violence: Not on file    Past Surgical History:  Procedure Laterality Date  . [place]  . Right knee surgery      Family History  Problem Relation Age of Onset  . Hypertension Mother   . Ulcers Mother   . Stomach cancer Father   . Stroke Maternal Grandmother   . Heart attack Neg Hx     Allergies  Allergen Reactions  . Lactose Intolerance (Gi)   . Atorvastatin Itching    Itching and myaliga  . Meperidine Hcl Other (See Comments)    Not known  . Pravastatin Rash    rash    CBC Latest Ref Rng & Units 08/24/2021 08/23/2021 08/22/2021  WBC 4.0 - 10.5 K/uL 6.2 6.9 6.0  Hemoglobin 12.0 - 15.0 g/dL 13.9 14.0 14.7  Hematocrit 36.0 - 46.0 % 42.2 42.9 46.2(H)  Platelets 150 - 400 K/uL 265 263 240      CMP     Component Value Date/Time   NA 136 08/24/2021 0439   NA 138 02/12/2021 1434   K 3.9 08/24/2021 0439   CL 104 08/24/2021 0439   CO2 25 08/24/2021 0439   GLUCOSE 114 (H) 08/24/2021 0439   BUN 31 (H) 08/24/2021 0439   BUN 14 02/12/2021 1434   CREATININE 0.98 08/24/2021 0439   CALCIUM 8.3 (L) 08/24/2021 0439   PROT 6.5 08/23/2021 0627   ALBUMIN 3.0 (L) 08/24/2021 0439   AST 20 08/23/2021 0627   ALT 24 08/23/2021 0627   ALKPHOS 105 08/23/2021 0627   BILITOT 0.7 08/23/2021 0627   GFRNONAA 57 (L) 08/24/2021 0439   GFRAA  10/09/2009 0350    >60        The eGFR has been calculated using the MDRD equation. This calculation has not been validated in all clinical situations. eGFR's persistently <60 mL/min signify possible Chronic Kidney Disease.     No results found.     Assessment & Plan:   1. Leg swelling The patient has an upcoming office visit with the wound clinic.  We will not place any wraps to allow them a chance to evaluate.  We will have the patient return in 1 month to see progress with her edema after follow-up with wound care center.  2. TOBACCO ABUSE Smoking cessation was discussed, 3-10 minutes spent on this  topic specifically   3. Type 2 diabetes, diet controlled ([place]) Continue hypoglycemic medications as already ordered, these medications have been reviewed and there are no  changes at this time.  Hgb A1C to be monitored as already arranged by primary service    Current Outpatient Medications on File Prior to Visit  Medication Sig Dispense Refill  . ASPIRIN ADULT PO Take 81 mg by mouth daily.    . clopidogrel (PLAVIX) 75 MG tablet TAKE ONE TABLET EVERY DAY 30 tablet 11  . dorzolamide (TRUSOPT) 2 % ophthalmic solution Place 1 drop into both eyes 2 (two) times daily.    . famotidine (PEPCID) 20 MG tablet Take 20 mg by mouth 2 (two) times daily.    . Fluticasone-Umeclidin-Vilant (TRELEGY ELLIPTA) 100-62.5-25 MCG/INH AEPB Inhale 1 puff into the lungs daily. 1 each 1  . furosemide (LASIX) 40 MG tablet Take 40 mg by mouth as needed for fluid.    Marland Kitchen gabapentin (NEURONTIN) 100 MG capsule Take 100-200 mg by mouth 2 (two) times daily. Pt takes one capsule every am, two capsules every pm    . latanoprost (XALATAN) 0.005 % ophthalmic solution Place 1 drop into both eyes in the morning.    Marland Kitchen losartan (COZAAR) 50 MG tablet Take 50 mg by mouth daily.    . magnesium hydroxide (MILK OF MAGNESIA) 400 MG/5ML suspension Take 30 mLs by mouth as needed for mild constipation.    . metoprolol succinate (TOPROL-XL) 25 MG 24 hr tablet Take 1 tablet (25 mg total) by mouth daily. 180 tablet 1  . NITROSTAT 0.4 MG SL tablet DISSOLVE ONE TABLET UNDER TONGUE AS NEEDED FOR CHEST PAIN - MAY REPEAT TWICE-IF NO RELIEF GO TO NEAREST HOSPITAL ER 25 tablet 0  . potassium chloride (KLOR-CON) 10 MEQ tablet Take 10 mEq by mouth 2 (two) times daily.    . rosuvastatin (CRESTOR) 20 MG tablet Take 20 mg by mouth daily.    . VENTOLIN HFA 108 (90 BASE) MCG/ACT inhaler INHALE ONE PUFF EVERY 6 HOURS AS NEEDED FOR SHORTNESS OF BREATH 18 g 2  . ipratropium-albuterol (DUONEB) 0.5-2.5 (3) MG/3ML SOLN Take 3 mLs by nebulization every 6 (six) hours  as needed (Shortness of breath, cough and/or wheeze). (Patient not taking: Reported on 09/05/2021) 360 mL    No current facility-administered medications on file prior to visit.    There are no Patient Instructions on file for this visit. No follow-ups on file.   Kris Hartmann, NP

## 2021-09-11 DIAGNOSIS — L97829 Non-pressure chronic ulcer of other part of left lower leg with unspecified severity: Secondary | ICD-10-CM | POA: Diagnosis not present

## 2021-09-11 DIAGNOSIS — E1151 Type 2 diabetes mellitus with diabetic peripheral angiopathy without gangrene: Secondary | ICD-10-CM | POA: Diagnosis not present

## 2021-09-11 DIAGNOSIS — I872 Venous insufficiency (chronic) (peripheral): Secondary | ICD-10-CM | POA: Diagnosis not present

## 2021-09-11 DIAGNOSIS — L97819 Non-pressure chronic ulcer of other part of right lower leg with unspecified severity: Secondary | ICD-10-CM | POA: Diagnosis not present

## 2021-09-11 DIAGNOSIS — L03115 Cellulitis of right lower limb: Secondary | ICD-10-CM | POA: Diagnosis not present

## 2021-09-11 DIAGNOSIS — L03116 Cellulitis of left lower limb: Secondary | ICD-10-CM | POA: Diagnosis not present

## 2021-09-13 DIAGNOSIS — H2589 Other age-related cataract: Secondary | ICD-10-CM | POA: Diagnosis not present

## 2021-09-13 DIAGNOSIS — H402221 Chronic angle-closure glaucoma, left eye, mild stage: Secondary | ICD-10-CM | POA: Diagnosis not present

## 2021-09-13 DIAGNOSIS — H2512 Age-related nuclear cataract, left eye: Secondary | ICD-10-CM | POA: Diagnosis not present

## 2021-09-14 DIAGNOSIS — L03115 Cellulitis of right lower limb: Secondary | ICD-10-CM | POA: Diagnosis not present

## 2021-09-14 DIAGNOSIS — I872 Venous insufficiency (chronic) (peripheral): Secondary | ICD-10-CM | POA: Diagnosis not present

## 2021-09-14 DIAGNOSIS — L97829 Non-pressure chronic ulcer of other part of left lower leg with unspecified severity: Secondary | ICD-10-CM | POA: Diagnosis not present

## 2021-09-14 DIAGNOSIS — E1151 Type 2 diabetes mellitus with diabetic peripheral angiopathy without gangrene: Secondary | ICD-10-CM | POA: Diagnosis not present

## 2021-09-14 DIAGNOSIS — L03116 Cellulitis of left lower limb: Secondary | ICD-10-CM | POA: Diagnosis not present

## 2021-09-14 DIAGNOSIS — L97819 Non-pressure chronic ulcer of other part of right lower leg with unspecified severity: Secondary | ICD-10-CM | POA: Diagnosis not present

## 2021-09-18 DIAGNOSIS — E1151 Type 2 diabetes mellitus with diabetic peripheral angiopathy without gangrene: Secondary | ICD-10-CM | POA: Diagnosis not present

## 2021-09-18 DIAGNOSIS — I872 Venous insufficiency (chronic) (peripheral): Secondary | ICD-10-CM | POA: Diagnosis not present

## 2021-09-18 DIAGNOSIS — L03115 Cellulitis of right lower limb: Secondary | ICD-10-CM | POA: Diagnosis not present

## 2021-09-18 DIAGNOSIS — L03116 Cellulitis of left lower limb: Secondary | ICD-10-CM | POA: Diagnosis not present

## 2021-09-18 DIAGNOSIS — L97829 Non-pressure chronic ulcer of other part of left lower leg with unspecified severity: Secondary | ICD-10-CM | POA: Diagnosis not present

## 2021-09-18 DIAGNOSIS — L97819 Non-pressure chronic ulcer of other part of right lower leg with unspecified severity: Secondary | ICD-10-CM | POA: Diagnosis not present

## 2021-09-19 DIAGNOSIS — L03116 Cellulitis of left lower limb: Secondary | ICD-10-CM | POA: Diagnosis not present

## 2021-09-19 DIAGNOSIS — L97819 Non-pressure chronic ulcer of other part of right lower leg with unspecified severity: Secondary | ICD-10-CM | POA: Diagnosis not present

## 2021-09-19 DIAGNOSIS — L03115 Cellulitis of right lower limb: Secondary | ICD-10-CM | POA: Diagnosis not present

## 2021-09-19 DIAGNOSIS — I872 Venous insufficiency (chronic) (peripheral): Secondary | ICD-10-CM | POA: Diagnosis not present

## 2021-09-19 DIAGNOSIS — L97829 Non-pressure chronic ulcer of other part of left lower leg with unspecified severity: Secondary | ICD-10-CM | POA: Diagnosis not present

## 2021-09-19 DIAGNOSIS — E1151 Type 2 diabetes mellitus with diabetic peripheral angiopathy without gangrene: Secondary | ICD-10-CM | POA: Diagnosis not present

## 2021-09-21 ENCOUNTER — Other Ambulatory Visit: Payer: Self-pay

## 2021-09-21 ENCOUNTER — Encounter: Payer: Medicare Other | Attending: Physician Assistant | Admitting: Physician Assistant

## 2021-09-21 DIAGNOSIS — I890 Lymphedema, not elsewhere classified: Secondary | ICD-10-CM | POA: Diagnosis not present

## 2021-09-21 DIAGNOSIS — L03116 Cellulitis of left lower limb: Secondary | ICD-10-CM | POA: Diagnosis not present

## 2021-09-21 DIAGNOSIS — I872 Venous insufficiency (chronic) (peripheral): Secondary | ICD-10-CM | POA: Diagnosis not present

## 2021-09-21 DIAGNOSIS — I110 Hypertensive heart disease with heart failure: Secondary | ICD-10-CM | POA: Diagnosis not present

## 2021-09-21 DIAGNOSIS — I5042 Chronic combined systolic (congestive) and diastolic (congestive) heart failure: Secondary | ICD-10-CM | POA: Diagnosis not present

## 2021-09-21 DIAGNOSIS — E1151 Type 2 diabetes mellitus with diabetic peripheral angiopathy without gangrene: Secondary | ICD-10-CM | POA: Diagnosis not present

## 2021-09-21 DIAGNOSIS — L97819 Non-pressure chronic ulcer of other part of right lower leg with unspecified severity: Secondary | ICD-10-CM | POA: Diagnosis not present

## 2021-09-21 DIAGNOSIS — J449 Chronic obstructive pulmonary disease, unspecified: Secondary | ICD-10-CM | POA: Insufficient documentation

## 2021-09-21 DIAGNOSIS — L97829 Non-pressure chronic ulcer of other part of left lower leg with unspecified severity: Secondary | ICD-10-CM | POA: Diagnosis not present

## 2021-09-21 DIAGNOSIS — L03115 Cellulitis of right lower limb: Secondary | ICD-10-CM | POA: Diagnosis not present

## 2021-09-21 NOTE — Progress Notes (Signed)
Jenny [name], Jenny [name] ([id number]) Visit Report for 09/21/2021 Abuse/Suicide Risk Screen Details Patient Name: Jenny [name] Date of Service: 09/21/2021 12:45 PM Medical Record Number: [id number] Patient Account Number: [id number] Date of Birth/Sex: 03/22/1933 (87 y.o. Female) Treating RN: Rogers Blocker Primary Care Krystian Younglove: Kirby Funk Other Clinician: Referring Velmer Woelfel: Candelaria Stagers Treating Oviya Ammar/Extender: Rowan Blase in Treatment: 0 Abuse/Suicide Risk Screen Items Answer ABUSE RISK SCREEN: Has anyone close to you tried to hurt or harm you recentlyo No Do you feel uncomfortable with anyone in your familyo No Has anyone forced you do things that you didnot want to doo No Electronic Signature(s) Signed: 09/21/2021 4:04:33 PM By: Rogers Blocker RN Entered By: Rogers Blocker on 09/21/2021 13:31:31 Jenny [name] ([id number]) -------------------------------------------------------------------------------- Activities of Daily Living Details Patient Name: Jenny [name] Date of Service: 09/21/2021 12:45 PM Medical Record Number: [id number] Patient Account Number: [id number] Date of Birth/Sex: 1933-09-06 (87 y.o. Female) Treating RN: Rogers Blocker Primary Care Meggie Laseter: Kirby Funk Other Clinician: Referring Bernard Donahoo: Candelaria Stagers Treating Caylon Saine/Extender: Rowan Blase in Treatment: 0 Activities of Daily Living Items Answer Activities of Daily Living (Please select one for each item) Drive Automobile Completely Able Take Medications Completely Able Use Telephone Completely Able Care for Appearance Completely Able Use Toilet Completely Able Bath / Shower Completely Able Dress Self Completely Able Feed Self Completely Able Walk Completely Able Get In / Out Bed Completely Able Housework Completely Able Prepare Meals Completely Able Handle Money Completely Able Shop for Self Completely Able Electronic Signature(s) Signed: 09/21/2021 4:04:33 PM By: Rogers Blocker RN Entered By: Rogers Blocker on 09/21/2021 13:31:57 Jenny [name] ([id number]) -------------------------------------------------------------------------------- Education Screening Details Patient Name: Jenny [name] Date of Service: 09/21/2021 12:45 PM Medical Record Number: [id number] Patient Account Number: [id number] Date of Birth/Sex: 1933/10/04 (87 y.o. Female) Treating RN: Rogers Blocker Primary Care Gwen Sarvis: Kirby Funk Other Clinician: Referring Kristena Wilhelmi: Candelaria Stagers Treating Haiven Nardone/Extender: Rowan Blase in Treatment: 0 Primary Learner Assessed: Patient Learning Preferences/Education Level/Primary Language Learning Preference: Explanation, Demonstration Preferred Language: English Cognitive Barrier Language Barrier: No Translator Needed: No Memory Deficit: No Emotional Barrier: No Cultural/Religious Beliefs Affecting Medical Care: No Physical Barrier Impaired Vision: No Impaired Hearing: No Decreased Hand dexterity: No Knowledge/Comprehension Knowledge Level: Medium Comprehension Level: Medium Ability to understand written instructions: Medium Ability to understand verbal instructions: Medium Motivation Anxiety Level: Calm Cooperation: Cooperative Education Importance: Acknowledges Need Interest in Health Problems: Asks Questions Perception: Coherent Willingness to Engage in Self-Management Medium Activities: Readiness to Engage in Self-Management Medium Activities: Electronic Signature(s) Signed: 09/21/2021 4:04:33 PM By: Rogers Blocker RN Entered By: Rogers Blocker on 09/21/2021 13:34:41 Jenny [name] ([id number]) -------------------------------------------------------------------------------- Fall Risk Assessment Details Patient Name: Jenny [name] Date of Service: 09/21/2021 12:45 PM Medical Record Number: [id number] Patient Account Number: [id number] Date of Birth/Sex: 02/15/1933 (87 y.o. Female) Treating RN: Rogers Blocker Primary Care Keeghan Bialy: Kirby Funk Other Clinician: Referring Zyiere Rosemond: Candelaria Stagers Treating [employer]/Extender: Rowan Blase in Treatment: 0 Fall Risk Assessment Items Have you had 2 or more falls in the last 12 monthso 0 No Have you had any fall that resulted in injury in the last 12 monthso 0 Yes FALLS RISK SCREEN History of falling - immediate or within 3 months 0 No Secondary diagnosis (Do you have 2 or more medical diagnoseso) 15 Yes Ambulatory aid None/bed rest/wheelchair/nurse 0 No Crutches/cane/walker 15 Yes Furniture 0 No Intravenous therapy Access/Saline/Heparin Lock 0 No Gait/Transferring Normal/ bed rest/ wheelchair 0 Yes Weak (short steps with or without shuffle, stooped but  able to lift head while walking, may 0 No seek support from furniture) Impaired (short steps with shuffle, may have difficulty arising from chair, head down, impaired 0 No balance) Mental Status Oriented to own ability 0 Yes Electronic Signature(s) Signed: 09/21/2021 4:04:33 PM By: Rogers Blocker RN Entered By: Rogers Blocker on 09/21/2021 13:35:01 Jenny [name] ([id number]) -------------------------------------------------------------------------------- Foot Assessment Details Patient Name: Jenny [name] Date of Service: 09/21/2021 12:45 PM Medical Record Number: [id number] Patient Account Number: [id number] Date of Birth/Sex: [birth year] (87 y.o. Female) Treating RN: Rogers Blocker Primary Care Zakery Normington: Kirby Funk Other Clinician: Referring Koleton Duchemin: Candelaria Stagers Treating Elzie Knisley/Extender: Rowan Blase in Treatment: 0 Foot Assessment Items Site Locations + = Sensation present, - = Sensation absent, C = Callus, U = Ulcer R = Redness, W = Warmth, M = Maceration, PU = Pre-ulcerative lesion F = Fissure, S = Swelling, D = Dryness Assessment Right: Left: Other Deformity: No No Prior Foot Ulcer: No No Prior Amputation: No No Charcot Joint: No No Ambulatory Status:  Ambulatory With Help Assistance Device: Walker Gait: Steady Electronic Signature(s) Signed: 09/21/2021 4:04:33 PM By: Rogers Blocker RN Entered By: Rogers Blocker on 09/21/2021 13:38:20 Jenny [name] ([id number]) -------------------------------------------------------------------------------- Nutrition Risk Screening Details Patient Name: Jenny [name] Date of Service: 09/21/2021 12:45 PM Medical Record Number: [id number] Patient Account Number: [id number] Date of Birth/Sex: Oct 12, 1933 (87 y.o. Female) Treating RN: Rogers Blocker Primary Care Savayah Waltrip: Kirby Funk Other Clinician: Referring Rainen Vanrossum: Candelaria Stagers Treating Marvell Stavola/Extender: Allen Derry Weeks in Treatment: 0 Height (in): 62 Weight (lbs): 188 Body Mass Index (BMI): 34.4 Nutrition Risk Screening Items Score Screening NUTRITION RISK SCREEN: I have an illness or condition that made me change the kind and/or amount of food I eat 0 No I eat fewer than two meals per day 0 No I eat few fruits and vegetables, or milk products 0 No I have three or more drinks of beer, liquor or wine almost every day 0 No I have tooth or mouth problems that make it hard for me to eat 0 No I don't always have enough money to buy the food I need 0 No I eat alone most of the time 0 No I take three or more different prescribed or over-the-counter drugs a day 1 Yes Without wanting to, I have lost or gained 10 pounds in the last six months 0 No I am not always physically able to shop, cook and/or feed myself 0 No Nutrition Protocols Good Risk Protocol 0 No interventions needed Moderate Risk Protocol High Risk Proctocol Risk Level: Good Risk Score: 1 Electronic Signature(s) Signed: 09/21/2021 4:04:33 PM By: Rogers Blocker RN Entered By: Rogers Blocker on 09/21/2021 13:35:20

## 2021-09-21 NOTE — Progress Notes (Signed)
CAPRIA, CARTAYA ([id number]) Visit Report for 09/21/2021 Chief Complaint Document Details Patient Name: Jenny [name], Jenny [name] Date of Service: 09/21/2021 12:45 PM Medical Record Number: [id number] Patient Account Number: [id number] Date of Birth/Sex: [birth year] (88 y.o. Female) Treating RN: Rogers Blocker Primary Care Provider: Kirby Funk Other Clinician: Referring Provider: Candelaria Stagers Treating Provider/Extender: Rowan Blase in Treatment: 0 Information Obtained from: Patient Chief Complaint Bilateral Lymphedema of the LEs Electronic Signature(s) Signed: 09/21/2021 1:54:48 PM By: Lenda Kelp PA-C Entered By: Lenda Kelp on 09/21/2021 13:54:48 Jenny [name] ([id number]) -------------------------------------------------------------------------------- HPI Details Patient Name: Jenny [name] Date of Service: 09/21/2021 12:45 PM Medical Record Number: [id number] Patient Account Number: [id number] Date of Birth/Sex: December 04, 1933 (88 y.o. Female) Treating RN: Rogers Blocker Primary Care Provider: Kirby Funk Other Clinician: Referring Provider: Candelaria Stagers Treating Provider/Extender: Rowan Blase in Treatment: 0 History of Present Illness HPI Description: 09/21/2021 upon evaluation today patient presents for initial inspection here in clinic concerning issues she has been having with wounds over her lower extremities bilaterally and this has been going on for the past year intermittently but in the past several weeks she had an issue with cellulitis which she had to go to the hospital for as well. They did do arterial studies which showed that she had ABIs at 0.7 on the left and 1.0 on the right. With that being said this is consistent with moderate underlying peripheral arterial disease on the left. In the right could be falsely elevated however with that being said the patient fortunately does not appear to be doing too bad and overall does seem to have decent flow I  think she could tolerate a 3 layer compression wrap I would not go any stronger than that. The overall lymphedema does need to have some compression she has compression socks but she is never actually had anything done as far as using these regularly is concerned. She does need to be wearing compression stockings regularly in my opinion. Patient does have a history of chronic venous insufficiency along with lymphedema due to this as well as COPD, hypertension, and congestive heart failure. Electronic Signature(s) Signed: 09/21/2021 4:01:46 PM By: Lenda Kelp PA-C Entered By: Lenda Kelp on 09/21/2021 16:01:45 Jenny [name] ([id number]) -------------------------------------------------------------------------------- Physical Exam Details Patient Name: Jenny [name] Date of Service: 09/21/2021 12:45 PM Medical Record Number: [id number] Patient Account Number: [id number] Date of Birth/Sex: 20-Nov-1933 (88 y.o. Female) Treating RN: Rogers Blocker Primary Care Provider: Kirby Funk Other Clinician: Referring Provider: Candelaria Stagers Treating Provider/Extender: Rowan Blase in Treatment: 0 Constitutional patient is hypertensive.. pulse regular and within target range for patient.Marland Kitchen respirations regular, non-labored and within target range for patient.Marland Kitchen temperature within target range for patient.. Well-nourished and well-hydrated in no acute distress. Eyes conjunctiva clear no eyelid edema noted. pupils equal round and reactive to light and accommodation. Ears, Nose, Mouth, and Throat no gross abnormality of ear auricles or external auditory canals. normal hearing noted during conversation. mucus membranes moist. Respiratory normal breathing without difficulty. Cardiovascular Patient's pulses were not palpable bilaterally but we could pick them up on Doppler.. Bilateral nonpitting edema secondary to lymphedema. Musculoskeletal normal gait and posture. no significant deformity or  arthritic changes, no loss or range of motion, no clubbing. Psychiatric this patient is able to make decisions and demonstrates good insight into disease process. Alert and Oriented x 3. pleasant and cooperative. Notes Upon inspection patient's wound bed actually showed signs of really having no significant open wounds at this  time she mainly has a couple areas of weeping and she in general is experiencing issues with lymphedema to be honest. I do not see any signs of active infection at this time. No fever chills noted I do think compression therapy with a 3 layer compression wrap would be ideal. I do think that as well the patient seems to be in need of good compression therapy this will be a start and then subsequently she will need compression stockings at home she does have some zipper compression stockings she is supposed to let us know what strength they are and bring them with her at the next visit. Electronic Signature(s) Signed: 09/21/2021 4:03:05 PM By: Lenda Kelp PA-C Entered By: Lenda Kelp on 09/21/2021 16:03:04 Jenny [name] ([id number]) -------------------------------------------------------------------------------- Physician Orders Details Patient Name: Jenny [name] Date of Service: 09/21/2021 12:45 PM Medical Record Number: [id number] Patient Account Number: [id number] Date of Birth/Sex: 12-17-1932 (88 y.o. Female) Treating RN: Rogers Blocker Primary Care Provider: Kirby Funk Other Clinician: Referring Provider: Candelaria Stagers Treating Provider/Extender: Rowan Blase in Treatment: 0 Verbal / Phone Orders: No Diagnosis Coding ICD-10 Coding Code Description I89.0 Lymphedema, not elsewhere classified I87.2 Venous insufficiency (chronic) (peripheral) J44.9 Chronic obstructive pulmonary disease, unspecified I10 Essential (primary) hypertension I50.42 Chronic combined systolic (congestive) and diastolic (congestive) heart failure Follow-up  Appointments o Return Appointment in 2 weeks. Home Health o Home Health Company: - [facility] o CONTINUE Home Health for wound care. May utilize formulary equivalent dressing for wound treatment orders unless otherwise specified. Home Health Nurse may visit PRN to address patientos wound care needs. o Scheduled days for dressing changes to be completed; exception, patient has scheduled wound care visit that day. o **Please direct any NON-WOUND related issues/requests for orders to patient's Primary Care Physician. **If current dressing causes regression in wound condition, may D/C ordered dressing product/s and apply Normal Saline Moist Dressing daily until next Wound Healing Center or Other MD appointment. **Notify Wound Healing Center of regression in wound condition at [phone]. Bathing/ Shower/ Hygiene o May shower with wound dressing protected with water repellent cover or cast protector. o No tub bath. Edema Control - Lymphedema / Segmental Compressive Device / Other Bilateral Lower Extremities o Optional: One layer of unna paste to top of compression wrap (to act as an anchor). o 3 Layer Compression System for Lymphedema. - Apply calcium alginate to any open/weeping areas, cover with ABD pads o Elevate, Exercise Daily and Avoid Standing for Long Periods of Time. o Elevate legs to the level of the heart and pump ankles as often as possible o Elevate leg(s) parallel to the floor when sitting. o DO YOUR BEST to sleep in the bed at night. DO NOT sleep in your recliner. Long hours of sitting in a recliner leads to swelling of the legs and/or potential wounds on your backside. Electronic Signature(s) Signed: 09/21/2021 4:04:33 PM By: Rogers Blocker RN Signed: 09/21/2021 4:11:02 PM By: Lenda Kelp PA-C Entered By: Rogers Blocker on 09/21/2021 14:03:24 Jenny [name]  ([id number]) -------------------------------------------------------------------------------- Problem List Details Patient Name: Jenny [name] Date of Service: 09/21/2021 12:45 PM Medical Record Number: [id number] Patient Account Number: [id number] Date of Birth/Sex: [birth year] (88 y.o. Female) Treating RN: Rogers Blocker Primary Care Provider: Kirby Funk Other Clinician: Referring Provider: Candelaria Stagers Treating Provider/Extender: Rowan Blase in Treatment: 0 Active Problems ICD-10 Encounter Code Description Active Date MDM Diagnosis I89.0 Lymphedema, not elsewhere classified 09/21/2021 No Yes I87.2 Venous insufficiency (chronic) (peripheral) 09/21/2021 No  Yes J44.9 Chronic obstructive pulmonary disease, unspecified 09/21/2021 No Yes I10 Essential (primary) hypertension 09/21/2021 No Yes I50.42 Chronic combined systolic (congestive) and diastolic (congestive) heart 09/21/2021 No Yes failure Inactive Problems Resolved Problems Electronic Signature(s) Signed: 09/21/2021 1:54:25 PM By: Lenda Kelp PA-C Entered By: Lenda Kelp on 09/21/2021 13:54:25 Jenny [name] ([id number]) -------------------------------------------------------------------------------- Progress Note Details Patient Name: Jenny [name] Date of Service: 09/21/2021 12:45 PM Medical Record Number: [id number] Patient Account Number: [id number] Date of Birth/Sex: 02/19/1933 (88 y.o. Female) Treating RN: Rogers Blocker Primary Care Provider: Kirby Funk Other Clinician: Referring Provider: Candelaria Stagers Treating Provider/Extender: Rowan Blase in Treatment: 0 Subjective Chief Complaint Information obtained from Patient Bilateral Lymphedema of the LEs History of Present Illness (HPI) 09/21/2021 upon evaluation today patient presents for initial inspection here in clinic concerning issues she has been having with wounds over her lower extremities bilaterally and this has been going on for the  past year intermittently but in the past several weeks she had an issue with cellulitis which she had to go to the hospital for as well. They did do arterial studies which showed that she had ABIs at 0.7 on the left and 1.0 on the right. With that being said this is consistent with moderate underlying peripheral arterial disease on the left. In the right could be falsely elevated however with that being said the patient fortunately does not appear to be doing too bad and overall does seem to have decent flow I think she could tolerate a 3 layer compression wrap I would not go any stronger than that. The overall lymphedema does need to have some compression she has compression socks but she is never actually had anything done as far as using these regularly is concerned. She does need to be wearing compression stockings regularly in my opinion. Patient does have a history of chronic venous insufficiency along with lymphedema due to this as well as COPD, hypertension, and congestive heart failure. Patient History Information obtained from Patient. Allergies Demerol, meperidine, atorvastatin Social History Current every day smoker. Medical History Eyes Patient has history of Cataracts - left eye, Glaucoma Hematologic/Lymphatic Patient has history of Lymphedema Respiratory Patient has history of Asthma, Chronic Obstructive Pulmonary Disease (COPD) Cardiovascular Patient has history of Congestive Heart Failure, Hypertension Musculoskeletal Patient has history of Osteoarthritis Review of Systems (ROS) Constitutional Symptoms (General Health) Denies complaints or symptoms of Fatigue, Fever, Chills, Marked Weight Change. Eyes Denies complaints or symptoms of Dry Eyes, Vision Changes, Glasses / Contacts. Ear/Nose/Mouth/Throat Denies complaints or symptoms of Difficult clearing ears, Sinusitis. Hematologic/Lymphatic Denies complaints or symptoms of Bleeding / Clotting Disorders, Human  Immunodeficiency Virus. Respiratory Complains or has symptoms of Shortness of Breath. Denies complaints or symptoms of Chronic or frequent coughs. Cardiovascular Denies complaints or symptoms of Chest pain, LE edema. Gastrointestinal Denies complaints or symptoms of Frequent diarrhea, Nausea, Vomiting. Endocrine Denies complaints or symptoms of Hepatitis, Thyroid disease, Polydypsia (Excessive Thirst). Genitourinary Denies complaints or symptoms of Kidney failure/ Dialysis, Incontinence/dribbling. Immunological Denies complaints or symptoms of Hives, Itching. Integumentary (Skin) Complains or has symptoms of Wounds, Swelling. Denies complaints or symptoms of Bleeding or bruising tendency, Breakdown. Musculoskeletal Denies complaints or symptoms of Muscle Pain, Muscle Weakness. Jenny [name], Jenny [name] ([id number]) Neurologic Denies complaints or symptoms of Numbness/parasthesias, Focal/Weakness. Psychiatric Denies complaints or symptoms of Anxiety, Claustrophobia. Objective Constitutional patient is hypertensive.. pulse regular and within target range for patient.Marland Kitchen respirations regular, non-labored and within target range for patient.Marland Kitchen temperature within target range for patient.. Well-nourished  and well-hydrated in no acute distress. Vitals Time Taken: 1:15 PM, Height: 62 in, Source: Stated, Weight: 188 lbs, Source: Measured, BMI: 34.4, Temperature: 98.0 F, Pulse: 51 bpm, Respiratory Rate: 18 breaths/min, Blood Pressure: 163/63 mmHg. Eyes conjunctiva clear no eyelid edema noted. pupils equal round and reactive to light and accommodation. Ears, Nose, Mouth, and Throat no gross abnormality of ear auricles or external auditory canals. normal hearing noted during conversation. mucus membranes moist. Respiratory normal breathing without difficulty. Cardiovascular Patient's pulses were not palpable bilaterally but we could pick them up on Doppler.. Bilateral nonpitting edema secondary to  lymphedema. Musculoskeletal normal gait and posture. no significant deformity or arthritic changes, no loss or range of motion, no clubbing. Psychiatric this patient is able to make decisions and demonstrates good insight into disease process. Alert and Oriented x 3. pleasant and cooperative. General Notes: Upon inspection patient's wound bed actually showed signs of really having no significant open wounds at this time she mainly has a couple areas of weeping and she in general is experiencing issues with lymphedema to be honest. I do not see any signs of active infection at this time. No fever chills noted I do think compression therapy with a 3 layer compression wrap would be ideal. I do think that as well the patient seems to be in need of good compression therapy this will be a start and then subsequently she will need compression stockings at home she does have some zipper compression stockings she is supposed to let us know what strength they are and bring them with her at the next visit. Assessment Active Problems ICD-10 Lymphedema, not elsewhere classified Venous insufficiency (chronic) (peripheral) Chronic obstructive pulmonary disease, unspecified Essential (primary) hypertension Chronic combined systolic (congestive) and diastolic (congestive) heart failure Procedures There was a Three Layer Compression Therapy Procedure by Rogers Blocker, RN. Post procedure Diagnosis Wound #: Same as Pre-Procedure There was a Three Layer Compression Therapy Procedure by Rogers Blocker, RN. Post procedure Diagnosis Wound #: Same as Pre-Procedure KATELINE, KINKADE ([id number]) Plan Follow-up Appointments: Return Appointment in 2 weeks. Home Health: Home Health Company: - [facility] CONTINUE Home Health for wound care. May utilize formulary equivalent dressing for wound treatment orders unless otherwise specified. Home Health Nurse may visit PRN to address patient s wound care needs. Scheduled  days for dressing changes to be completed; exception, patient has scheduled wound care visit that day. **Please direct any NON-WOUND related issues/requests for orders to patient's Primary Care Physician. **If current dressing causes regression in wound condition, may D/C ordered dressing product/s and apply Normal Saline Moist Dressing daily until next Wound Healing Center or Other MD appointment. **Notify Wound Healing Center of regression in wound condition at [phone]. Bathing/ Shower/ Hygiene: May shower with wound dressing protected with water repellent cover or cast protector. No tub bath. Edema Control - Lymphedema / Segmental Compressive Device / Other: Optional: One layer of unna paste to top of compression wrap (to act as an anchor). 3 Layer Compression System for Lymphedema. - Apply calcium alginate to any open/weeping areas, cover with ABD pads Elevate, Exercise Daily and Avoid Standing for Long Periods of Time. Elevate legs to the level of the heart and pump ankles as often as possible Elevate leg(s) parallel to the floor when sitting. DO YOUR BEST to sleep in the bed at night. DO NOT sleep in your recliner. Long hours of sitting in a recliner leads to swelling of the legs and/or potential wounds on your backside. 1. Would  recommend currently that we actually go ahead and initiate treatment with a 3 layer compression wrap bilaterally using plain alginate to any open wound locations. Again right now these are to small weeping areas and that is not even too much going on at this point. 2. I am to recommend his well we have the patient continue with elevation I think this is still good to be of utmost importance try to keep the edema down and prevent anything from worsening significantly here. 3. I am also can recommend that we have the patient sleep in her bed not a recliner which I think would also be of benefit for her. We will see patient back for reevaluation in 2 weeks here  in the clinic. If anything worsens or changes patient will contact our office for additional recommendations. Home health will continue to change the wraps in the interim. Electronic Signature(s) Signed: 09/21/2021 4:04:10 PM By: Lenda Kelp PA-C Entered By: Lenda Kelp on 09/21/2021 16:04:09 Jenny [name] ([id number]) -------------------------------------------------------------------------------- ROS/PFSH Details Patient Name: Jenny [name] Date of Service: 09/21/2021 12:45 PM Medical Record Number: [id number] Patient Account Number: [id number] Date of Birth/Sex: 12-06-1933 (88 y.o. Female) Treating RN: Rogers Blocker Primary Care Provider: Kirby Funk Other Clinician: Referring Provider: Candelaria Stagers Treating Provider/Extender: Rowan Blase in Treatment: 0 Information Obtained From Patient Constitutional Symptoms (General Health) Complaints and Symptoms: Negative for: Fatigue; Fever; Chills; Marked Weight Change Eyes Complaints and Symptoms: Negative for: Dry Eyes; Vision Changes; Glasses / Contacts Medical History: Positive for: Cataracts - left eye; Glaucoma Ear/Nose/Mouth/Throat Complaints and Symptoms: Negative for: Difficult clearing ears; Sinusitis Hematologic/Lymphatic Complaints and Symptoms: Negative for: Bleeding / Clotting Disorders; Human Immunodeficiency Virus Medical History: Positive for: Lymphedema Respiratory Complaints and Symptoms: Positive for: Shortness of Breath Negative for: Chronic or frequent coughs Medical History: Positive for: Asthma; Chronic Obstructive Pulmonary Disease (COPD) Cardiovascular Complaints and Symptoms: Negative for: Chest pain; LE edema Medical History: Positive for: Congestive Heart Failure; Hypertension Gastrointestinal Complaints and Symptoms: Negative for: Frequent diarrhea; Nausea; Vomiting Endocrine Complaints and Symptoms: Negative for: Hepatitis; Thyroid disease; Polydypsia (Excessive  Thirst) Genitourinary Jenny [name], Jenny [name]. ([id number]) Complaints and Symptoms: Negative for: Kidney failure/ Dialysis; Incontinence/dribbling Immunological Complaints and Symptoms: Negative for: Hives; Itching Integumentary (Skin) Complaints and Symptoms: Positive for: Wounds; Swelling Negative for: Bleeding or bruising tendency; Breakdown Musculoskeletal Complaints and Symptoms: Negative for: Muscle Pain; Muscle Weakness Medical History: Positive for: Osteoarthritis Neurologic Complaints and Symptoms: Negative for: Numbness/parasthesias; Focal/Weakness Psychiatric Complaints and Symptoms: Negative for: Anxiety; Claustrophobia Oncologic HBO Extended History Items Eyes: Eyes: Cataracts Glaucoma Immunizations Pneumococcal Vaccine: Received Pneumococcal Vaccination: No Implantable Devices No devices added Family and Social History Current every day smoker Psychologist, prison and probation services) Signed: 09/21/2021 4:04:33 PM By: Rogers Blocker RN Signed: 09/21/2021 4:11:02 PM By: Lenda Kelp PA-C Entered By: Rogers Blocker on 09/21/2021 13:31:19 Jenny [name] ([id number]) -------------------------------------------------------------------------------- SuperBill Details Patient Name: Jenny [name] Date of Service: 09/21/2021 Medical Record Number: [id number] Patient Account Number: [id number] Date of Birth/Sex: March 22, 1933 (88 y.o. Female) Treating RN: Rogers Blocker Primary Care Provider: Kirby Funk Other Clinician: Referring Provider: Candelaria Stagers Treating Provider/Extender: Rowan Blase in Treatment: 0 Diagnosis Coding ICD-10 Codes Code Description I89.0 Lymphedema, not elsewhere classified I87.2 Venous insufficiency (chronic) (peripheral) J44.9 Chronic obstructive pulmonary disease, unspecified I10 Essential (primary) hypertension I50.42 Chronic combined systolic (congestive) and diastolic (congestive) heart failure Facility Procedures CPT4: Description Modifier  Quantity Code [id number] 99213 - WOUND CARE VISIT-LEV 3 EST PT 1 CPT4: [id number] BILATERAL: Application of multi-layer  venous compression system; leg (below knee), including 1 ankle and foot. Physician Procedures CPT4 Code: [id number] Description: 99214 - WC PHYS LEVEL 4 - EST PT Modifier: Quantity: 1 CPT4 Code: Description: ICD-10 Diagnosis Description I89.0 Lymphedema, not elsewhere classified I87.2 Venous insufficiency (chronic) (peripheral) J44.9 Chronic obstructive pulmonary disease, unspecified I10 Essential (primary) hypertension Modifier: Quantity: Electronic Signature(s) Signed: 09/21/2021 4:10:24 PM By: Lenda Kelp PA-C Previous Signature: 09/21/2021 4:04:33 PM Version By: Rogers Blocker RN Entered By: Lenda Kelp on 09/21/2021 16:10:23

## 2021-09-21 NOTE — Progress Notes (Signed)
KEAUNA, BRASEL ([id number]) Visit Report for 09/21/2021 Allergy List Details Patient Name: Jenny [name], Jenny [name] Date of Service: 09/21/2021 12:45 PM Medical Record Number: [id number] Patient Account Number: [id number] Date of Birth/Sex: 11/06/1933 (88 y.o. Female) Treating RN: Rogers Blocker Primary Care Lenorris Karger: Kirby Funk Other Clinician: Referring Lucella Pommier: Candelaria Stagers Treating Ilian Wessell/Extender: Allen Derry Weeks in Treatment: 0 Allergies Active Allergies Demerol meperidine atorvastatin Allergy Notes Electronic Signature(s) Signed: 09/21/2021 4:04:33 PM By: Rogers Blocker RN Entered By: Rogers Blocker on 09/21/2021 13:26:07 Jenny [name] ([id number]) -------------------------------------------------------------------------------- Arrival Information Details Patient Name: Jenny [name] Date of Service: 09/21/2021 12:45 PM Medical Record Number: [id number] Patient Account Number: [id number] Date of Birth/Sex: [birth year] (88 y.o. Female) Treating RN: Rogers Blocker Primary Care Myran Arcia: Kirby Funk Other Clinician: Referring Akaash Vandewater: Candelaria Stagers Treating Maribell Demeo/Extender: Rowan Blase in Treatment: 0 Visit Information Patient Arrived: Dan Humphreys Arrival Time: 13:13 Accompanied By: self Transfer Assistance: None Patient Identification Verified: Yes Secondary Verification Process Completed: Yes Patient Has Alerts: Yes Patient Alerts: Patient on Blood Thinner ***PLAVIX*** Electronic Signature(s) Signed: 09/21/2021 4:04:03 PM By: Rogers Blocker RN Entered By: Rogers Blocker on 09/21/2021 16:04:03 Jenny [name] ([id number]) -------------------------------------------------------------------------------- Clinic Level of Care Assessment Details Patient Name: Jenny [name] Date of Service: 09/21/2021 12:45 PM Medical Record Number: [id number] Patient Account Number: [id number] Date of Birth/Sex: 07-08-1933 (88 y.o. Female) Treating RN: Rogers Blocker Primary Care  Sueanne Maniaci: Kirby Funk Other Clinician: Referring Raigan Baria: Candelaria Stagers Treating Haizel Gatchell/Extender: Rowan Blase in Treatment: 0 Clinic Level of Care Assessment Items TOOL 1 Quantity Score X - Use when EandM and Procedure is performed on INITIAL visit 1 0 ASSESSMENTS - Nursing Assessment / Reassessment X - General Physical Exam (combine w/ comprehensive assessment (listed just below) when performed on new 1 20 pt. evals) X- 1 25 Comprehensive Assessment (HX, ROS, Risk Assessments, Wounds Hx, etc.) ASSESSMENTS - Wound and Skin Assessment / Reassessment X - Dermatologic / Skin Assessment (not related to wound area) 1 10 ASSESSMENTS - Ostomy and/or Continence Assessment and Care []  - Incontinence Assessment and Management 0 []  - 0 Ostomy Care Assessment and Management (repouching, etc.) PROCESS - Coordination of Care X - Simple Patient / Family Education for ongoing care 1 15 []  - 0 Complex (extensive) Patient / Family Education for ongoing care X- 1 10 Staff obtains , Records, Test Results / Process Orders []  - 0 Staff telephones HHA, Nursing Homes / Clarify orders / etc []  - 0 Routine Transfer to another Facility (non-emergent condition) []  - 0 Routine Hospital Admission (non-emergent condition) X- 1 15 New Admissions / / Ordering NPWT, Apligraf, etc. []  - 0 Emergency Hospital Admission (emergent condition) PROCESS - Special Needs []  - Pediatric / Minor Patient Management 0 []  - 0 Isolation Patient Management []  - 0 Hearing / Language / Visual special needs []  - 0 Assessment of Community assistance (transportation, D/C planning, etc.) []  - 0 Additional assistance / Altered mentation []  - 0 Support Surface(s) Assessment (bed, cushion, seat, etc.) INTERVENTIONS - Miscellaneous []  - External ear exam 0 []  - 0 Patient Transfer (multiple staff / / Similar devices) []  - 0 Simple Staple / Suture removal (25 or less) []  -  0 Complex Staple / Suture removal (26 or more) []  - 0 Hypo/Hyperglycemic Management (do not check if billed separately) []  - 0 Ankle / Brachial Index (ABI) - do not check if billed separately Has the patient been seen at the hospital within the last three years: Yes Total Score: 95  Level Of Care: New/Established - Level 3 GUENEVERE, ROORDA ([id number]) Electronic Signature(s) Signed: 09/21/2021 4:04:33 PM By: Rogers Blocker RN Entered By: Rogers Blocker on 09/21/2021 14:03:44 Jenny [name] ([id number]) -------------------------------------------------------------------------------- Compression Therapy Details Patient Name: Jenny [name] Date of Service: 09/21/2021 12:45 PM Medical Record Number: [id number] Patient Account Number: [id number] Date of Birth/Sex: 1933/07/16 (88 y.o. Female) Treating RN: Rogers Blocker Primary Care Almina Schul: Kirby Funk Other Clinician: Referring Durk Carmen: Candelaria Stagers Treating Zakari Bathe/Extender: Allen Derry Weeks in Treatment: 0 Compression Therapy Performed for Wound Assessment: NonWound Condition Lymphedema - Left Leg Performed By: Clinician Rogers Blocker, RN Compression Type: Three Layer Post Procedure Diagnosis Same as Pre-procedure Electronic Signature(s) Signed: 09/21/2021 4:04:33 PM By: Rogers Blocker RN Entered By: Rogers Blocker on 09/21/2021 14:01:37 Jenny [name] ([id number]) -------------------------------------------------------------------------------- Compression Therapy Details Patient Name: Jenny [name] Date of Service: 09/21/2021 12:45 PM Medical Record Number: [id number] Patient Account Number: [id number] Date of Birth/Sex: 1932-12-30 (88 y.o. Female) Treating RN: Rogers Blocker Primary Care Maytte Jacot: Kirby Funk Other Clinician: Referring Ricketta Colantonio: Candelaria Stagers Treating Gaje Tennyson/Extender: Allen Derry Weeks in Treatment: 0 Compression Therapy Performed for Wound Assessment: NonWound Condition Lymphedema - Right  Leg Performed By: Clinician Rogers Blocker, RN Compression Type: Three Layer Post Procedure Diagnosis Same as Pre-procedure Electronic Signature(s) Signed: 09/21/2021 4:04:33 PM By: Rogers Blocker RN Entered By: Rogers Blocker on 09/21/2021 14:01:50 Jenny [name] ([id number]) -------------------------------------------------------------------------------- Encounter Discharge Information Details Patient Name: Jenny [name] Date of Service: 09/21/2021 12:45 PM Medical Record Number: [id number] Patient Account Number: [id number] Date of Birth/Sex: Mar 27, 1933 (88 y.o. Female) Treating RN: Rogers Blocker Primary Care Shloma Roggenkamp: Kirby Funk Other Clinician: Referring Kohana Amble: Candelaria Stagers Treating Suda Forbess/Extender: Rowan Blase in Treatment: 0 Encounter Discharge Information Items Discharge Condition: Stable Ambulatory Status: Walker Discharge Destination: Home Transportation: Private Auto Accompanied By: self Schedule Follow-up Appointment: Yes Clinical Summary of Care: Electronic Signature(s) Signed: 09/21/2021 4:03:39 PM By: Rogers Blocker RN Entered By: Rogers Blocker on 09/21/2021 16:03:39 Jenny [name] ([id number]) -------------------------------------------------------------------------------- Lower Extremity Assessment Details Patient Name: Jenny [name] Date of Service: 09/21/2021 12:45 PM Medical Record Number: [id number] Patient Account Number: [id number] Date of Birth/Sex: 07-23-1933 (88 y.o. Female) Treating RN: Rogers Blocker Primary Care Bettymae Yott: Kirby Funk Other Clinician: Referring Moneka Mcquinn: Candelaria Stagers Treating Karnell Vanderloop/Extender: Allen Derry Weeks in Treatment: 0 Edema Assessment Assessed: [Left: Yes] [Right: Yes] Edema: [Left: Yes] [Right: Yes] Calf Left: Right: Point of Measurement: 24 cm From Medial Instep 34.5 cm 32 cm Ankle Left: Right: Point of Measurement: 8 cm From Medial Instep 21.5 cm 21 cm Vascular Assessment Pulses: Dorsalis  Pedis Palpable: [Left:Yes] [Right:Yes] Notes ABI's completed on 08/23/21 Electronic Signature(s) Signed: 09/21/2021 4:04:33 PM By: Rogers Blocker RN Entered By: Rogers Blocker on 09/21/2021 13:42:15 Jenny [name] ([id number]) -------------------------------------------------------------------------------- Multi Wound Chart Details Patient Name: Jenny [name] Date of Service: 09/21/2021 12:45 PM Medical Record Number: [id number] Patient Account Number: [id number] Date of Birth/Sex: 1933/07/11 (88 y.o. Female) Treating RN: Rogers Blocker Primary Care Magdala Brahmbhatt: Kirby Funk Other Clinician: Referring Devita Nies: Candelaria Stagers Treating Pattye Meda/Extender: Rowan Blase in Treatment: 0 Vital Signs Height(in): 62 Pulse(bpm): 51 Weight(lbs): 188 Blood Pressure(mmHg): 163/63 Body Mass Index(BMI): 34 Temperature(F): 98.0 Respiratory Rate(breaths/min): 18 Wound Assessments Treatment Notes Electronic Signature(s) Signed: 09/21/2021 4:04:33 PM By: Rogers Blocker RN Entered By: Rogers Blocker on 09/21/2021 14:00:37 Jenny [name] ([id number]) -------------------------------------------------------------------------------- Multi-Disciplinary Care Plan Details Patient Name: Jenny [name] Date of Service: 09/21/2021 12:45 PM Medical Record Number: [id number] Patient Account Number: [id number] Date of Birth/Sex: 1933/10/28 (88 y.o. Female)  Treating RN: Rogers Blocker Primary Care Mabelle Mungin: Kirby Funk Other Clinician: Referring Serine Kea: Candelaria Stagers Treating Shaelynn Dragos/Extender: Rowan Blase in Treatment: 0 Active Inactive Orientation to the Wound Care Program Nursing Diagnoses: Knowledge deficit related to the wound healing center program Goals: Patient/caregiver will verbalize understanding of the Wound Healing Center Program Date Initiated: 09/21/2021 Target Resolution Date: 09/21/2021 Goal Status: Active Interventions: Provide education on orientation to the wound  center Notes: Wound/Skin Impairment Nursing Diagnoses: Impaired tissue integrity Goals: Patient/caregiver will verbalize understanding of skin care regimen Date Initiated: 09/21/2021 Target Resolution Date: 09/21/2021 Goal Status: Active Interventions: Assess patient/caregiver ability to perform ulcer/skin care regimen upon admission and as needed Assess ulceration(s) every visit Provide education on smoking Provide education on ulcer and skin care Treatment Activities: Skin care regimen initiated : 09/21/2021 Notes: Electronic Signature(s) Signed: 09/21/2021 4:04:33 PM By: Rogers Blocker RN Entered By: Rogers Blocker on 09/21/2021 14:00:22 Jenny [name] ([id number]) -------------------------------------------------------------------------------- Pain Assessment Details Patient Name: Jenny [name] Date of Service: 09/21/2021 12:45 PM Medical Record Number: [id number] Patient Account Number: [id number] Date of Birth/Sex: 1933/08/29 (88 y.o. Female) Treating RN: Rogers Blocker Primary Care Ananth Fiallos: Kirby Funk Other Clinician: Referring Taiwana Willison: Candelaria Stagers Treating Taeler Winning/Extender: Rowan Blase in Treatment: 0 Active Problems Location of Pain Severity and Description of Pain Patient Has Paino No Site Locations Pain Management and Medication Current Pain Management: Electronic Signature(s) Signed: 09/21/2021 4:04:33 PM By: Rogers Blocker RN Entered By: Rogers Blocker on 09/21/2021 13:14:39 Jenny [name] ([id number]) -------------------------------------------------------------------------------- Patient/Caregiver Education Details Patient Name: Jenny [name] Date of Service: 09/21/2021 12:45 PM Medical Record Number: [id number] Patient Account Number: [id number] Date of Birth/Gender: Oct 08, 1933 (88 y.o. Female) Treating RN: Rogers Blocker Primary Care Physician: Kirby Funk Other Clinician: Referring Physician: Candelaria Stagers Treating Physician/Extender:  Rowan Blase in Treatment: 0 Education Assessment Education Provided To: Patient Education Topics Provided Welcome To The Wound Care Center: Methods: Explain/Verbal Responses: State content correctly Wound/Skin Impairment: Methods: Explain/Verbal Responses: State content correctly Electronic Signature(s) Signed: 09/21/2021 4:04:33 PM By: Rogers Blocker RN Entered By: Rogers Blocker on 09/21/2021 14:24:54 Jenny [name] ([id number]) -------------------------------------------------------------------------------- Vitals Details Patient Name: Jenny [name] Date of Service: 09/21/2021 12:45 PM Medical Record Number: [id number] Patient Account Number: [id number] Date of Birth/Sex: July 13, 1933 (88 y.o. Female) Treating RN: Rogers Blocker Primary Care Loreli Debruler: Kirby Funk Other Clinician: Referring Tandy Grawe: Candelaria Stagers Treating Kelsi Benham/Extender: Rowan Blase in Treatment: 0 Vital Signs Time Taken: 13:15 Temperature (F): 98.0 Height (in): 62 Pulse (bpm): 51 Source: Stated Respiratory Rate (breaths/min): 18 Weight (lbs): 188 Blood Pressure (mmHg): 163/63 Source: Measured Reference Range: 80 - 120 mg / dl Body Mass Index (BMI): 34.4 Electronic Signature(s) Signed: 09/21/2021 4:04:33 PM By: Rogers Blocker RN Entered By: Rogers Blocker on 09/21/2021 13:18:03

## 2021-09-25 DIAGNOSIS — E1151 Type 2 diabetes mellitus with diabetic peripheral angiopathy without gangrene: Secondary | ICD-10-CM | POA: Diagnosis not present

## 2021-09-25 DIAGNOSIS — L03116 Cellulitis of left lower limb: Secondary | ICD-10-CM | POA: Diagnosis not present

## 2021-09-25 DIAGNOSIS — L97819 Non-pressure chronic ulcer of other part of right lower leg with unspecified severity: Secondary | ICD-10-CM | POA: Diagnosis not present

## 2021-09-25 DIAGNOSIS — L97829 Non-pressure chronic ulcer of other part of left lower leg with unspecified severity: Secondary | ICD-10-CM | POA: Diagnosis not present

## 2021-09-25 DIAGNOSIS — I872 Venous insufficiency (chronic) (peripheral): Secondary | ICD-10-CM | POA: Diagnosis not present

## 2021-09-25 DIAGNOSIS — L03115 Cellulitis of right lower limb: Secondary | ICD-10-CM | POA: Diagnosis not present

## 2021-09-27 ENCOUNTER — Telehealth: Payer: Self-pay | Admitting: Cardiovascular Disease

## 2021-09-27 DIAGNOSIS — Z7902 Long term (current) use of antithrombotics/antiplatelets: Secondary | ICD-10-CM | POA: Diagnosis not present

## 2021-09-27 DIAGNOSIS — I4819 Other persistent atrial fibrillation: Secondary | ICD-10-CM | POA: Diagnosis not present

## 2021-09-27 DIAGNOSIS — I5033 Acute on chronic diastolic (congestive) heart failure: Secondary | ICD-10-CM | POA: Diagnosis not present

## 2021-09-27 DIAGNOSIS — F17210 Nicotine dependence, cigarettes, uncomplicated: Secondary | ICD-10-CM | POA: Diagnosis not present

## 2021-09-27 DIAGNOSIS — L03115 Cellulitis of right lower limb: Secondary | ICD-10-CM | POA: Diagnosis not present

## 2021-09-27 DIAGNOSIS — L97819 Non-pressure chronic ulcer of other part of right lower leg with unspecified severity: Secondary | ICD-10-CM | POA: Diagnosis not present

## 2021-09-27 DIAGNOSIS — J441 Chronic obstructive pulmonary disease with (acute) exacerbation: Secondary | ICD-10-CM | POA: Diagnosis not present

## 2021-09-27 DIAGNOSIS — E669 Obesity, unspecified: Secondary | ICD-10-CM | POA: Diagnosis not present

## 2021-09-27 DIAGNOSIS — E785 Hyperlipidemia, unspecified: Secondary | ICD-10-CM | POA: Diagnosis not present

## 2021-09-27 DIAGNOSIS — E1151 Type 2 diabetes mellitus with diabetic peripheral angiopathy without gangrene: Secondary | ICD-10-CM | POA: Diagnosis not present

## 2021-09-27 DIAGNOSIS — J449 Chronic obstructive pulmonary disease, unspecified: Secondary | ICD-10-CM | POA: Diagnosis not present

## 2021-09-27 DIAGNOSIS — Z9181 History of falling: Secondary | ICD-10-CM | POA: Diagnosis not present

## 2021-09-27 DIAGNOSIS — L03116 Cellulitis of left lower limb: Secondary | ICD-10-CM | POA: Diagnosis not present

## 2021-09-27 DIAGNOSIS — Z6837 Body mass index (BMI) 37.0-37.9, adult: Secondary | ICD-10-CM | POA: Diagnosis not present

## 2021-09-27 DIAGNOSIS — I6523 Occlusion and stenosis of bilateral carotid arteries: Secondary | ICD-10-CM | POA: Diagnosis not present

## 2021-09-27 DIAGNOSIS — I110 Hypertensive heart disease with heart failure: Secondary | ICD-10-CM | POA: Diagnosis not present

## 2021-09-27 DIAGNOSIS — I872 Venous insufficiency (chronic) (peripheral): Secondary | ICD-10-CM | POA: Diagnosis not present

## 2021-09-27 DIAGNOSIS — I2510 Atherosclerotic heart disease of native coronary artery without angina pectoris: Secondary | ICD-10-CM | POA: Diagnosis not present

## 2021-09-27 DIAGNOSIS — I252 Old myocardial infarction: Secondary | ICD-10-CM | POA: Diagnosis not present

## 2021-09-27 DIAGNOSIS — H409 Unspecified glaucoma: Secondary | ICD-10-CM | POA: Diagnosis not present

## 2021-09-27 DIAGNOSIS — Z7982 Long term (current) use of aspirin: Secondary | ICD-10-CM | POA: Diagnosis not present

## 2021-09-27 DIAGNOSIS — L97829 Non-pressure chronic ulcer of other part of left lower leg with unspecified severity: Secondary | ICD-10-CM | POA: Diagnosis not present

## 2021-09-27 DIAGNOSIS — R001 Bradycardia, unspecified: Secondary | ICD-10-CM

## 2021-09-27 NOTE — Telephone Encounter (Signed)
STAT if HR is under 50 or over 120 (normal HR is 60-100 beats per minute)  What is your heart rate? 40  Do you have a log of your heart rate readings (document readings)? 10/18 - 55, 10/14- 70, 10/12- 78, 10/11 - 64, 10/7- 52, 10/4 - 69  Do you have any other symptoms? No symptoms pt says she feels fine.

## 2021-09-27 NOTE — Telephone Encounter (Signed)
Amy RN with [facility] calling in.  Patient's HR is 40, normal in the 50's at least. Blood pressure 160/80. Metoprolol succinate 25 mg daily. Has not taking any medications yet today. No missed doses of medication. Taking PRN lasix every other day. Nurse is going to hold Metoprolol until she hears back from [place]. Patient asymptomatic. ED precautions given. Will forward to MD for advisement.

## 2021-09-28 NOTE — Telephone Encounter (Signed)
Called pt.  HH nurse checked HR yesterday as well as her home bp monitor.  Readings were same.   She is holding Toprol XL 25 mg.  She is fatigued but otherwise she is feeling her usual self.  Reviewed with Dr. Lynnette Caffey.  3 day Zio XT monitor ordered.  Pt will come in on 10/01/21 for placement.  We also scheduled her annual visit with Dr. Clifton James due in Feb.

## 2021-10-01 ENCOUNTER — Other Ambulatory Visit (INDEPENDENT_AMBULATORY_CARE_PROVIDER_SITE_OTHER): Payer: Medicare Other

## 2021-10-01 ENCOUNTER — Other Ambulatory Visit: Payer: Self-pay

## 2021-10-01 DIAGNOSIS — H409 Unspecified glaucoma: Secondary | ICD-10-CM | POA: Diagnosis not present

## 2021-10-01 DIAGNOSIS — H402224 Chronic angle-closure glaucoma, left eye, indeterminate stage: Secondary | ICD-10-CM | POA: Diagnosis not present

## 2021-10-01 DIAGNOSIS — I272 Pulmonary hypertension, unspecified: Secondary | ICD-10-CM | POA: Diagnosis not present

## 2021-10-01 DIAGNOSIS — J449 Chronic obstructive pulmonary disease, unspecified: Secondary | ICD-10-CM | POA: Diagnosis not present

## 2021-10-01 DIAGNOSIS — M8580 Other specified disorders of bone density and structure, unspecified site: Secondary | ICD-10-CM | POA: Diagnosis not present

## 2021-10-01 DIAGNOSIS — R001 Bradycardia, unspecified: Secondary | ICD-10-CM | POA: Diagnosis not present

## 2021-10-01 DIAGNOSIS — I10 Essential (primary) hypertension: Secondary | ICD-10-CM | POA: Diagnosis not present

## 2021-10-01 DIAGNOSIS — E782 Mixed hyperlipidemia: Secondary | ICD-10-CM | POA: Diagnosis not present

## 2021-10-01 DIAGNOSIS — I2510 Atherosclerotic heart disease of native coronary artery without angina pectoris: Secondary | ICD-10-CM | POA: Diagnosis not present

## 2021-10-01 NOTE — Progress Notes (Unsigned)
[id number] from office inventory applied to patient.

## 2021-10-02 DIAGNOSIS — L97819 Non-pressure chronic ulcer of other part of right lower leg with unspecified severity: Secondary | ICD-10-CM | POA: Diagnosis not present

## 2021-10-02 DIAGNOSIS — L03115 Cellulitis of right lower limb: Secondary | ICD-10-CM | POA: Diagnosis not present

## 2021-10-02 DIAGNOSIS — E1151 Type 2 diabetes mellitus with diabetic peripheral angiopathy without gangrene: Secondary | ICD-10-CM | POA: Diagnosis not present

## 2021-10-02 DIAGNOSIS — L97829 Non-pressure chronic ulcer of other part of left lower leg with unspecified severity: Secondary | ICD-10-CM | POA: Diagnosis not present

## 2021-10-02 DIAGNOSIS — L03116 Cellulitis of left lower limb: Secondary | ICD-10-CM | POA: Diagnosis not present

## 2021-10-02 DIAGNOSIS — I872 Venous insufficiency (chronic) (peripheral): Secondary | ICD-10-CM | POA: Diagnosis not present

## 2021-10-04 DIAGNOSIS — I872 Venous insufficiency (chronic) (peripheral): Secondary | ICD-10-CM | POA: Diagnosis not present

## 2021-10-04 DIAGNOSIS — L03115 Cellulitis of right lower limb: Secondary | ICD-10-CM | POA: Diagnosis not present

## 2021-10-04 DIAGNOSIS — L97819 Non-pressure chronic ulcer of other part of right lower leg with unspecified severity: Secondary | ICD-10-CM | POA: Diagnosis not present

## 2021-10-04 DIAGNOSIS — L97829 Non-pressure chronic ulcer of other part of left lower leg with unspecified severity: Secondary | ICD-10-CM | POA: Diagnosis not present

## 2021-10-04 DIAGNOSIS — L03116 Cellulitis of left lower limb: Secondary | ICD-10-CM | POA: Diagnosis not present

## 2021-10-04 DIAGNOSIS — E1151 Type 2 diabetes mellitus with diabetic peripheral angiopathy without gangrene: Secondary | ICD-10-CM | POA: Diagnosis not present

## 2021-10-05 ENCOUNTER — Other Ambulatory Visit: Payer: Self-pay

## 2021-10-05 ENCOUNTER — Encounter: Payer: Medicare Other | Admitting: Physician Assistant

## 2021-10-05 DIAGNOSIS — I890 Lymphedema, not elsewhere classified: Secondary | ICD-10-CM | POA: Diagnosis not present

## 2021-10-05 DIAGNOSIS — I5042 Chronic combined systolic (congestive) and diastolic (congestive) heart failure: Secondary | ICD-10-CM | POA: Diagnosis not present

## 2021-10-05 DIAGNOSIS — J449 Chronic obstructive pulmonary disease, unspecified: Secondary | ICD-10-CM | POA: Diagnosis not present

## 2021-10-05 DIAGNOSIS — L97819 Non-pressure chronic ulcer of other part of right lower leg with unspecified severity: Secondary | ICD-10-CM | POA: Diagnosis not present

## 2021-10-05 DIAGNOSIS — I110 Hypertensive heart disease with heart failure: Secondary | ICD-10-CM | POA: Diagnosis not present

## 2021-10-05 DIAGNOSIS — I872 Venous insufficiency (chronic) (peripheral): Secondary | ICD-10-CM | POA: Diagnosis not present

## 2021-10-05 NOTE — Progress Notes (Addendum)
Jenny [name], Jenny [name] ([id number]) Visit Report for 10/05/2021 Chief Complaint Document Details Patient Name: Jenny [name] Date of Service: 10/05/2021 2:45 PM Medical Record Number: [id number] Patient Account Number: [id number] Date of Birth/Sex: May 31, 1933 (87 y.o. F) Treating RN: Huel Coventry Primary Care Provider: Kirby Funk Other Clinician: Referring Provider: Kirby Funk Treating Provider/Extender: Rowan Blase in Treatment: 2 Information Obtained from: Patient Chief Complaint Bilateral Lymphedema of the LEs Electronic Signature(s) Signed: 10/05/2021 3:25:35 PM By: Lenda Kelp PA-C Entered By: Lenda Kelp on 10/05/2021 15:25:35 Jenny [name] ([id number]) -------------------------------------------------------------------------------- HPI Details Patient Name: Jenny [name] Date of Service: 10/05/2021 2:45 PM Medical Record Number: [id number] Patient Account Number: [id number] Date of Birth/Sex: 13-Feb-1933 (87 y.o. F) Treating RN: Huel Coventry Primary Care Provider: Kirby Funk Other Clinician: Referring Provider: Kirby Funk Treating Provider/Extender: Rowan Blase in Treatment: 2 History of Present Illness HPI Description: 09/21/2021 upon evaluation today patient presents for initial inspection here in clinic concerning issues she has been having with wounds over her lower extremities bilaterally and this has been going on for the past year intermittently but in the past several weeks she had an issue with cellulitis which she had to go to the hospital for as well. They did do arterial studies which showed that she had ABIs at 0.7 on the left and 1.0 on the right. With that being said this is consistent with moderate underlying peripheral arterial disease on the left. In the right could be falsely elevated however with that being said the patient fortunately does not appear to be doing too bad and overall does seem to have decent flow I think she could  tolerate a 3 layer compression wrap I would not go any stronger than that. The overall lymphedema does need to have some compression she has compression socks but she is never actually had anything done as far as using these regularly is concerned. She does need to be wearing compression stockings regularly in my opinion. Patient does have a history of chronic venous insufficiency along with lymphedema due to this as well as COPD, hypertension, and congestive heart failure. 10/05/2021 upon evaluation today patient actually appears to be doing well with regard to her wounds. She does have a blister that popped up on the right leg but fortunately there is nothing too significant here which is great news. Overall I think that she is actually doing excellent from the standpoint of the weeping which was previously noted in comparison today is doing also. With that being said the unfortunate thing is that she continues to have some discoloration of her legs that this really does not look good I am more concerned about a vascular issue here. She does see Dr. Drucilla Schmidt on Tuesday this is good news. Nonetheless I do believe that the patient is overall doing well with her zipper compression which seems to be holding her swelling under decent control. Electronic Signature(s) Signed: 10/05/2021 6:20:11 PM By: Lenda Kelp PA-C Entered By: Lenda Kelp on 10/05/2021 18:20:11 Jenny [name] ([id number]) -------------------------------------------------------------------------------- Physical Exam Details Patient Name: Jenny [name] Date of Service: 10/05/2021 2:45 PM Medical Record Number: [id number] Patient Account Number: [id number] Date of Birth/Sex: Oct 27, 1933 (87 y.o. F) Treating RN: Huel Coventry Primary Care Provider: Kirby Funk Other Clinician: Referring Provider: Kirby Funk Treating Provider/Extender: Allen Derry Weeks in Treatment: 2 Constitutional Well-nourished and well-hydrated in no acute  distress. Respiratory normal breathing without difficulty. Psychiatric this patient is able to make decisions and demonstrates good  insight into disease process. Alert and Oriented x 3. pleasant and cooperative. Notes Upon inspection patient's wound bed actually showed signs of good granulation epithelization at this time. Fortunately I do not see any signs of infection and her weeping is much better than previous even the blister area we counted today seems to be doing better than what it was previous according to what the patient tells me. She does see Dr. Drucilla Schmidt on Tuesday I am interested to see what he says about her discoloration of her legs in general as well as the discoloration of her toes I am more concerned now either if she does not have anything blood flow limitation wise that she may actually have some microvascular disease possibly even Raynaud's phenomenon. Electronic Signature(s) Signed: 10/05/2021 6:20:49 PM By: Lenda Kelp PA-C Entered By: Lenda Kelp on 10/05/2021 18:20:49 Jenny [name] ([id number]) -------------------------------------------------------------------------------- Physician Orders Details Patient Name: Jenny [name] Date of Service: 10/05/2021 2:45 PM Medical Record Number: [id number] Patient Account Number: [id number] Date of Birth/Sex: December 09, 1933 (87 y.o. F) Treating RN: Huel Coventry Primary Care Provider: Kirby Funk Other Clinician: Referring Provider: Kirby Funk Treating Provider/Extender: Rowan Blase in Treatment: 2 Verbal / Phone Orders: No Diagnosis Coding ICD-10 Coding Code Description I89.0 Lymphedema, not elsewhere classified I87.2 Venous insufficiency (chronic) (peripheral) J44.9 Chronic obstructive pulmonary disease, unspecified I10 Essential (primary) hypertension I50.42 Chronic combined systolic (congestive) and diastolic (congestive) heart failure Follow-up Appointments o Return Appointment in 1 week. Home  Health o Home Health Company: - [facility] o CONTINUE Home Health for wound care. May utilize formulary equivalent dressing for wound treatment orders unless otherwise specified. Home Health Nurse may visit PRN to address patientos wound care needs. o Scheduled days for dressing changes to be completed; exception, patient has scheduled wound care visit that day. o **Please direct any NON-WOUND related issues/requests for orders to patient's Primary Care Physician. **If current dressing causes regression in wound condition, may D/C ordered dressing product/s and apply Normal Saline Moist Dressing daily until next Wound Healing Center or Other MD appointment. **Notify Wound Healing Center of regression in wound condition at [phone]. Bathing/ Shower/ Hygiene o May shower with wound dressing protected with water repellent cover or cast protector. o No tub bath. Edema Control - Lymphedema / Segmental Compressive Device / Other Bilateral Lower Extremities o Patient to wear own compression stockings. Remove compression stockings every night before going to bed and put on every morning when getting up. o Elevate, Exercise Daily and Avoid Standing for Long Periods of Time. o Elevate legs to the level of the heart and pump ankles as often as possible o Elevate leg(s) parallel to the floor when sitting. o DO YOUR BEST to sleep in the bed at night. DO NOT sleep in your recliner. Long hours of sitting in a recliner leads to swelling of the legs and/or potential wounds on your backside. Wound Treatment Wound #1 - Lower Leg Wound Laterality: Right, Midline, Distal Secondary Dressing: ABD Pad 5x9 (in/in) Discharge Instructions: Cover with ABD pad Electronic Signature(s) Signed: 10/05/2021 7:05:42 PM By: Lenda Kelp PA-C Signed: 10/08/2021 3:17:27 PM By: Elliot Gurney, BSN, RN, CWS, Kim RN, BSN Entered By: Elliot Gurney, BSN, RN, CWS, Kim on 10/05/2021 15:45:05 Jenny [name]  ([id number]) -------------------------------------------------------------------------------- Problem List Details Patient Name: Jenny [name] Date of Service: 10/05/2021 2:45 PM Medical Record Number: [id number] Patient Account Number: [id number] Date of Birth/Sex: 04-25-1933 (88 y.o. F) Treating RN: Huel Coventry Primary Care Provider: Kirby Funk Other Clinician:  Referring Provider: Kirby Funk Treating Provider/Extender: Allen Derry Weeks in Treatment: 2 Active Problems ICD-10 Encounter Code Description Active Date MDM Diagnosis I89.0 Lymphedema, not elsewhere classified 09/21/2021 No Yes I87.2 Venous insufficiency (chronic) (peripheral) 09/21/2021 No Yes J44.9 Chronic obstructive pulmonary disease, unspecified 09/21/2021 No Yes I10 Essential (primary) hypertension 09/21/2021 No Yes I50.42 Chronic combined systolic (congestive) and diastolic (congestive) heart 09/21/2021 No Yes failure Inactive Problems Resolved Problems Electronic Signature(s) Signed: 10/05/2021 3:25:24 PM By: Lenda Kelp PA-C Entered By: Lenda Kelp on 10/05/2021 15:25:23 Jenny [name] ([id number]) -------------------------------------------------------------------------------- Progress Note Details Patient Name: Jenny [name] Date of Service: 10/05/2021 2:45 PM Medical Record Number: [id number] Patient Account Number: [id number] Date of Birth/Sex: August 02, 1933 (88 y.o. F) Treating RN: Huel Coventry Primary Care Provider: Kirby Funk Other Clinician: Referring Provider: Kirby Funk Treating Provider/Extender: Rowan Blase in Treatment: 2 Subjective Chief Complaint Information obtained from Patient Bilateral Lymphedema of the LEs History of Present Illness (HPI) 09/21/2021 upon evaluation today patient presents for initial inspection here in clinic concerning issues she has been having with wounds over her lower extremities bilaterally and this has been going on for the past year  intermittently but in the past several weeks she had an issue with cellulitis which she had to go to the hospital for as well. They did do arterial studies which showed that she had ABIs at 0.7 on the left and 1.0 on the right. With that being said this is consistent with moderate underlying peripheral arterial disease on the left. In the right could be falsely elevated however with that being said the patient fortunately does not appear to be doing too bad and overall does seem to have decent flow I think she could tolerate a 3 layer compression wrap I would not go any stronger than that. The overall lymphedema does need to have some compression she has compression socks but she is never actually had anything done as far as using these regularly is concerned. She does need to be wearing compression stockings regularly in my opinion. Patient does have a history of chronic venous insufficiency along with lymphedema due to this as well as COPD, hypertension, and congestive heart failure. 10/05/2021 upon evaluation today patient actually appears to be doing well with regard to her wounds. She does have a blister that popped up on the right leg but fortunately there is nothing too significant here which is great news. Overall I think that she is actually doing excellent from the standpoint of the weeping which was previously noted in comparison today is doing also. With that being said the unfortunate thing is that she continues to have some discoloration of her legs that this really does not look good I am more concerned about a vascular issue here. She does see Dr. Drucilla Schmidt on Tuesday this is good news. Nonetheless I do believe that the patient is overall doing well with her zipper compression which seems to be holding her swelling under decent control. Objective Constitutional Well-nourished and well-hydrated in no acute distress. Vitals Time Taken: 3:16 PM, Height: 62 in, Weight: 188 lbs, BMI: 34.4,  Temperature: 98 F, Pulse: 65 bpm, Respiratory Rate: 18 breaths/min, Blood Pressure: 132/78 mmHg. Respiratory normal breathing without difficulty. Psychiatric this patient is able to make decisions and demonstrates good insight into disease process. Alert and Oriented x 3. pleasant and cooperative. General Notes: Upon inspection patient's wound bed actually showed signs of good granulation epithelization at this time. Fortunately I do not see any signs of  infection and her weeping is much better than previous even the blister area we counted today seems to be doing better than what it was previous according to what the patient tells me. She does see Dr. Drucilla Schmidt on Tuesday I am interested to see what he says about her discoloration of her legs in general as well as the discoloration of her toes I am more concerned now either if she does not have anything blood flow limitation wise that she may actually have some microvascular disease possibly even Raynaud's phenomenon. Integumentary (Hair, Skin) Wound #1 status is Open. Original cause of wound was Blister. The date acquired was: 10/02/2021. The wound is located on the Right,Distal,Midline Lower Leg. The wound measures 1.5cm length x 0.9cm width x 0.1cm depth; 1.06cm^2 area and 0.106cm^3 volume. There is no tunneling or undermining noted. There is a none present amount of drainage noted. There is large (67-100%) red granulation within the wound bed. There is no necrotic tissue within the wound bed. VERNAL, RUTAN ([id number]) Assessment Active Problems ICD-10 Lymphedema, not elsewhere classified Venous insufficiency (chronic) (peripheral) Chronic obstructive pulmonary disease, unspecified Essential (primary) hypertension Chronic combined systolic (congestive) and diastolic (congestive) heart failure Plan Follow-up Appointments: Return Appointment in 1 week. Home Health: Home Health Company: - [facility] CONTINUE Home Health for wound care.  May utilize formulary equivalent dressing for wound treatment orders unless otherwise specified. Home Health Nurse may visit PRN to address patient s wound care needs. Scheduled days for dressing changes to be completed; exception, patient has scheduled wound care visit that day. **Please direct any NON-WOUND related issues/requests for orders to patient's Primary Care Physician. **If current dressing causes regression in wound condition, may D/C ordered dressing product/s and apply Normal Saline Moist Dressing daily until next Wound Healing Center or Other MD appointment. **Notify Wound Healing Center of regression in wound condition at [phone]. Bathing/ Shower/ Hygiene: May shower with wound dressing protected with water repellent cover or cast protector. No tub bath. Edema Control - Lymphedema / Segmental Compressive Device / Other: Patient to wear own compression stockings. Remove compression stockings every night before going to bed and put on every morning when getting up. Elevate, Exercise Daily and Avoid Standing for Long Periods of Time. Elevate legs to the level of the heart and pump ankles as often as possible Elevate leg(s) parallel to the floor when sitting. DO YOUR BEST to sleep in the bed at night. DO NOT sleep in your recliner. Long hours of sitting in a recliner leads to swelling of the legs and/or potential wounds on your backside. WOUND #1: - Lower Leg Wound Laterality: Right, Midline, Distal Secondary Dressing: ABD Pad 5x9 (in/in) Discharge Instructions: Cover with ABD pad 1. Would recommend currently that we continue with her zipper compression which seem to be doing well. 2. We will just put an ABD pad over the areas in question to catch any drainage. 3. We can wait and see what Dr. Drucilla Schmidt has the same next week about where things stand. Depending on the results of that evaluation will make any determinations or Magod need to go for a wound care perspective going  forward. We will see patient back for reevaluation in 1 week here in the clinic. If anything worsens or changes patient will contact our office for additional recommendations. Electronic Signature(s) Signed: 10/05/2021 6:21:14 PM By: Lenda Kelp PA-C Entered By: Lenda Kelp on 10/05/2021 18:21:14 Jenny [name] ([id number]) -------------------------------------------------------------------------------- SuperBill Details Patient Name: Jenny [name] Date  of Service: 10/05/2021 Medical Record Number: [id number] Patient Account Number: [id number] Date of Birth/Sex: 1933-08-22 (88 y.o. F) Treating RN: Huel Coventry Primary Care Provider: Kirby Funk Other Clinician: Referring Provider: Kirby Funk Treating Provider/Extender: Rowan Blase in Treatment: 2 Diagnosis Coding ICD-10 Codes Code Description I89.0 Lymphedema, not elsewhere classified I87.2 Venous insufficiency (chronic) (peripheral) J44.9 Chronic obstructive pulmonary disease, unspecified I10 Essential (primary) hypertension I50.42 Chronic combined systolic (congestive) and diastolic (congestive) heart failure Facility Procedures CPT4 Code: [id number] Description: 99213 - WOUND CARE VISIT-LEV 3 EST PT Modifier: Quantity: 1 Physician Procedures CPT4 Code: [id number] Description: 99213 - WC PHYS LEVEL 3 - EST PT Modifier: Quantity: 1 CPT4 Code: Description: ICD-10 Diagnosis Description I89.0 Lymphedema, not elsewhere classified I87.2 Venous insufficiency (chronic) (peripheral) J44.9 Chronic obstructive pulmonary disease, unspecified I10 Essential (primary) hypertension Modifier: Quantity: Electronic Signature(s) Signed: 10/05/2021 6:22:02 PM By: Lenda Kelp PA-C Entered By: Lenda Kelp on 10/05/2021 18:22:02

## 2021-10-08 NOTE — Progress Notes (Addendum)
DANAIJA, JEWART ([id number]) Visit Report for 10/05/2021 Arrival Information Details Patient Name: Jenny [name], Jenny [name] Date of Service: 10/05/2021 2:45 PM Medical Record Number: [id number] Patient Account Number: [id number] Date of Birth/Sex: [birth year] (88 y.o. F) Treating RN: Cornell Barman Primary Care Jakoby Melendrez: Lavone Orn Other Clinician: Referring Sharia Averitt: Lavone Orn Treating Latera Mclin/Extender: Skipper Cliche in Treatment: 2 Visit Information History Since Last Visit Added or deleted any medications: No Patient Arrived: Walker Has Compression in Place as Prescribed: No Arrival Time: 15:10 Pain Present Now: Yes Accompanied By: self Transfer Assistance: None Patient Identification Verified: Yes Secondary Verification Process Completed: Yes Patient Has Alerts: Yes Patient Alerts: Patient on Blood Thinner ***PLAVIX*** Electronic Signature(s) Signed: 10/08/2021 3:17:27 PM By: Gretta Cool, BSN, RN, CWS, Kim RN, BSN Entered By: Gretta Cool, BSN, RN, CWS, Kim on 10/05/2021 15:16:23 Jenny [name] ([id number]) -------------------------------------------------------------------------------- Clinic Level of Care Assessment Details Patient Name: Jenny [name] Date of Service: 10/05/2021 2:45 PM Medical Record Number: [id number] Patient Account Number: [id number] Date of Birth/Sex: 1933-12-09 (88 y.o. F) Treating RN: Cornell Barman Primary Care Jayland Null: Lavone Orn Other Clinician: Referring Denis Koppel: Lavone Orn Treating Katharina Jehle/Extender: Skipper Cliche in Treatment: 2 Clinic Level of Care Assessment Items TOOL 4 Quantity Score []  - Use when only an EandM is performed on FOLLOW-UP visit 0 ASSESSMENTS - Nursing Assessment / Reassessment X - Reassessment of Co-morbidities (includes updates in patient status) 1 10 X- 1 5 Reassessment of Adherence to Treatment Plan ASSESSMENTS - Wound and Skin Assessment / Reassessment X - Simple Wound Assessment / Reassessment - one wound 1 5 []  -  0 Complex Wound Assessment / Reassessment - multiple wounds []  - 0 Dermatologic / Skin Assessment (not related to wound area) ASSESSMENTS - Focused Assessment []  - Circumferential Edema Measurements - multi extremities 0 []  - 0 Nutritional Assessment / Counseling / Intervention []  - 0 Lower Extremity Assessment (monofilament, tuning fork, pulses) []  - 0 Peripheral Arterial Disease Assessment (using hand held doppler) ASSESSMENTS - Ostomy and/or Continence Assessment and Care []  - Incontinence Assessment and Management 0 []  - 0 Ostomy Care Assessment and Management (repouching, etc.) PROCESS - Coordination of Care X - Simple Patient / Family Education for ongoing care 1 15 []  - 0 Complex (extensive) Patient / Family Education for ongoing care []  - 0 Staff obtains Programmer, systems, Records, Test Results / Process Orders []  - 0 Staff telephones HHA, Nursing Homes / Clarify orders / etc []  - 0 Routine Transfer to another Facility (non-emergent condition) []  - 0 Routine Hospital Admission (non-emergent condition) []  - 0 New Admissions / Biomedical engineer / Ordering NPWT, Apligraf, etc. []  - 0 Emergency Hospital Admission (emergent condition) X- 1 10 Simple Discharge Coordination []  - 0 Complex (extensive) Discharge Coordination PROCESS - Special Needs []  - Pediatric / Minor Patient Management 0 []  - 0 Isolation Patient Management []  - 0 Hearing / Language / Visual special needs []  - 0 Assessment of Community assistance (transportation, D/C planning, etc.) []  - 0 Additional assistance / Altered mentation []  - 0 Support Surface(s) Assessment (bed, cushion, seat, etc.) INTERVENTIONS - Wound Cleansing / Measurement Jenny [name], COVER[place]ALE. ([id number]) X- 1 5 Simple Wound Cleansing - one wound []  - 0 Complex Wound Cleansing - multiple wounds X- 1 5 Wound Imaging (photographs - any number of wounds) []  - 0 Wound Tracing (instead of photographs) X- 1 5 Simple Wound Measurement -  one wound []  - 0 Complex Wound Measurement - multiple wounds INTERVENTIONS - Wound Dressings []  - Small Wound Dressing one  or multiple wounds 0 X- 1 15 Medium Wound Dressing one or multiple wounds []  - 0 Large Wound Dressing one or multiple wounds []  - 0 Application of Medications - topical []  - 0 Application of Medications - injection INTERVENTIONS - Miscellaneous []  - External ear exam 0 []  - 0 Specimen Collection (cultures, biopsies, blood, body fluids, etc.) []  - 0 Specimen(s) / Culture(s) sent or taken to Lab for analysis []  - 0 Patient Transfer (multiple staff / Civil Service fast streamer / Similar devices) []  - 0 Simple Staple / Suture removal (25 or less) []  - 0 Complex Staple / Suture removal (26 or more) []  - 0 Hypo / Hyperglycemic Management (close monitor of Blood Glucose) []  - 0 Ankle / Brachial Index (ABI) - do not check if billed separately X- 1 5 Vital Signs Has the patient been seen at the hospital within the last three years: Yes Total Score: 80 Level Of Care: New/Established - Level 3 Electronic Signature(s) Signed: 10/08/2021 3:17:27 PM By: Gretta Cool, BSN, RN, CWS, Kim RN, BSN Entered By: Gretta Cool, BSN, RN, CWS, Kim on 10/05/2021 15:46:23 Jenny [name] ([id number]) -------------------------------------------------------------------------------- Encounter Discharge Information Details Patient Name: Jenny [name] Date of Service: 10/05/2021 2:45 PM Medical Record Number: [id number] Patient Account Number: [id number] Date of Birth/Sex: 1933/05/22 (88 y.o. F) Treating RN: Cornell Barman Primary Care Lanasia Porras: Lavone Orn Other Clinician: Referring Wynston Romey: Lavone Orn Treating Jashan Cotten/Extender: Skipper Cliche in Treatment: 2 Encounter Discharge Information Items Discharge Condition: Stable Ambulatory Status: Walker Discharge Destination: Home Transportation: Private Auto Schedule Follow-up Appointment: Yes Clinical Summary of Care: Electronic  Signature(s) Signed: 10/08/2021 3:17:27 PM By: Gretta Cool, BSN, RN, CWS, Kim RN, BSN Entered By: Gretta Cool, BSN, RN, CWS, Kim on 10/05/2021 15:47:20 Jenny [name] ([id number]) -------------------------------------------------------------------------------- Lower Extremity Assessment Details Patient Name: Jenny [name] Date of Service: 10/05/2021 2:45 PM Medical Record Number: [id number] Patient Account Number: [id number] Date of Birth/Sex: 11-Jun-1933 (88 y.o. F) Treating RN: Cornell Barman Primary Care Delainee Tramel: Lavone Orn Other Clinician: Referring Korynn Kenedy: Lavone Orn Treating Miron Marxen/Extender: Jeri Cos Weeks in Treatment: 2 Edema Assessment Assessed: [Left: No] [Right: No] [Left: Edema] [Right: :] Calf Left: Right: Point of Measurement: 24 cm From Medial Instep 35 cm 33 cm Ankle Left: Right: Point of Measurement: 8 cm From Medial Instep 21 cm 21 cm Vascular Assessment Pulses: Dorsalis Pedis Palpable: [Left:Yes] [Right:Yes] Electronic Signature(s) Signed: 10/08/2021 3:17:27 PM By: Gretta Cool, BSN, RN, CWS, Kim RN, BSN Entered By: Gretta Cool, BSN, RN, CWS, Kim on 10/05/2021 15:23:35 Jenny [name] ([id number]) -------------------------------------------------------------------------------- Multi Wound Chart Details Patient Name: Jenny [name] Date of Service: 10/05/2021 2:45 PM Medical Record Number: [id number] Patient Account Number: [id number] Date of Birth/Sex: [birth year] (88 y.o. F) Treating RN: Cornell Barman Primary Care Mckaylin Bastien: Lavone Orn Other Clinician: Referring Lineth Thielke: Lavone Orn Treating Havyn Ramo/Extender: Skipper Cliche in Treatment: 2 Vital Signs Height(in): 27 Pulse(bpm): 39 Weight(lbs): 188 Blood Pressure(mmHg): 132/78 Body Mass Index(BMI): 34 Temperature(F): 98 Respiratory Rate(breaths/min): 18 Photos: [N/A:N/A] Wound Location: Right, Distal, Midline Lower Leg N/A N/A Wounding Event: Blister N/A N/A Primary Etiology: Venous Leg Ulcer N/A  N/A Comorbid History: Cataracts, Glaucoma, Lymphedema, N/A N/A Asthma, Chronic Obstructive Pulmonary Disease (COPD), Congestive Heart Failure, Hypertension, Osteoarthritis Date Acquired: 10/02/2021 N/A N/A Weeks of Treatment: 0 N/A N/A Wound Status: Open N/A N/A Measurements L x W x D (cm) 1.5x0.9x0.1 N/A N/A Area (cm) : 1.06 N/A N/A Volume (cm) : 0.106 N/A N/A % Reduction in Area: 0.00% N/A N/A % Reduction in Volume: 0.00% N/A N/A Classification: Full  Thickness Without Exposed N/A N/A Support Structures Exudate Amount: None Present N/A N/A Granulation Amount: Large (67-100%) N/A N/A Granulation Quality: Red N/A N/A Necrotic Amount: None Present (0%) N/A N/A Epithelialization: None N/A N/A Treatment Notes Electronic Signature(s) Signed: 10/08/2021 3:17:27 PM By: Elliot Gurney, BSN, RN, CWS, Kim RN, BSN Entered By: Elliot Gurney, BSN, RN, CWS, Kim on 10/05/2021 15:41:45 Jenny [name] ([id number]) -------------------------------------------------------------------------------- Multi-Disciplinary Care Plan Details Patient Name: Jenny [name] Date of Service: 10/05/2021 2:45 PM Medical Record Number: [id number] Patient Account Number: [id number] Date of Birth/Sex: [birth year] (88 y.o. F) Treating RN: Huel Coventry Primary Care Dekota Shenk: Kirby Funk Other Clinician: Referring Lendon George: Kirby Funk Treating Bella Brummet/Extender: Rowan Blase in Treatment: 2 Active Inactive Electronic Signature(s) Signed: 10/25/2021 4:07:34 PM By: Elliot Gurney, BSN, RN, CWS, Kim RN, BSN Previous Signature: 10/08/2021 3:17:27 PM Version By: Elliot Gurney, BSN, RN, CWS, Kim RN, BSN Entered By: Elliot Gurney, BSN, RN, CWS, Kim on 10/25/2021 16:07:34 Jenny [name] ([id number]) -------------------------------------------------------------------------------- Pain Assessment Details Patient Name: Jenny [name] Date of Service: 10/05/2021 2:45 PM Medical Record Number: [id number] Patient Account Number: [id number] Date of  Birth/Sex: 22-Nov-1933 (88 y.o. F) Treating RN: Huel Coventry Primary Care Taurean Ju: Kirby Funk Other Clinician: Referring Jaymi Tinner: Kirby Funk Treating Armoni Depass/Extender: Rowan Blase in Treatment: 2 Active Problems Location of Pain Severity and Description of Pain Patient Has Paino Yes Site Locations Pain Location: Pain in Ulcers Rate the pain. Current Pain Level: 4 Pain Management and Medication Current Pain Management: Electronic Signature(s) Signed: 10/08/2021 3:17:27 PM By: Elliot Gurney, BSN, RN, CWS, Kim RN, BSN Entered By: Elliot Gurney, BSN, RN, CWS, Kim on 10/05/2021 15:17:05 Jenny [name] ([id number]) -------------------------------------------------------------------------------- Patient/Caregiver Education Details Patient Name: Jenny [name] Date of Service: 10/05/2021 2:45 PM Medical Record Number: [id number] Patient Account Number: [id number] Date of Birth/Gender: Aug 25, 1933 (88 y.o. F) Treating RN: Huel Coventry Primary Care Physician: Kirby Funk Other Clinician: Referring Physician: Kirby Funk Treating Physician/Extender: Rowan Blase in Treatment: 2 Education Assessment Education Provided To: Patient Education Topics Provided Wound/Skin Impairment: Handouts: Caring for Your Ulcer Methods: Demonstration, Explain/Verbal Responses: State content correctly Electronic Signature(s) Signed: 10/08/2021 3:17:27 PM By: Elliot Gurney, BSN, RN, CWS, Kim RN, BSN Entered By: Elliot Gurney, BSN, RN, CWS, Kim on 10/05/2021 15:46:45 Jenny [name] ([id number]) -------------------------------------------------------------------------------- Wound Assessment Details Patient Name: Jenny [name] Date of Service: 10/05/2021 2:45 PM Medical Record Number: [id number] Patient Account Number: [id number] Date of Birth/Sex: 1933-10-18 (88 y.o. F) Treating RN: Huel Coventry Primary Care Deisha Stull: Kirby Funk Other Clinician: Referring Nakeda Lebron: Kirby Funk Treating Jacub Waiters/Extender: Allen Derry Weeks in Treatment: 2 Wound Status Wound Number: 1 Primary Venous Leg Ulcer Etiology: Wound Location: Right, Distal, Midline Lower Leg Wound Open Wounding Event: Blister Status: Date Acquired: 10/02/2021 Comorbid Cataracts, Glaucoma, Lymphedema, Asthma, Chronic Weeks Of Treatment: 0 History: Obstructive Pulmonary Disease (COPD), Congestive Heart Clustered Wound: No Failure, Hypertension, Osteoarthritis Photos Wound Measurements Length: (cm) 1.5 Width: (cm) 0.9 Depth: (cm) 0.1 Area: (cm) 1.06 Volume: (cm) 0.106 % Reduction in Area: 0% % Reduction in Volume: 0% Epithelialization: None Tunneling: No Undermining: No Wound Description Classification: Full Thickness Without Exposed Support Structure Exudate Amount: None Present s Wound Bed Granulation Amount: Large (67-100%) Granulation Quality: Red Necrotic Amount: None Present (0%) Electronic Signature(s) Signed: 10/08/2021 3:17:27 PM By: Elliot Gurney, BSN, RN, CWS, Kim RN, BSN Entered By: Elliot Gurney, BSN, RN, CWS, Kim on 10/05/2021 15:22:19 Jenny [name] ([id number]) -------------------------------------------------------------------------------- Vitals Details Patient Name: Jenny [name] Date of Service: 10/05/2021 2:45 PM Medical Record Number: [id number] Patient Account Number: [id number] Date  of Birth/Sex: 1932-12-29 (88 y.o. F) Treating RN: Cornell Barman Primary Care Rajeev Escue: Lavone Orn Other Clinician: Referring Rosamae Rocque: Lavone Orn Treating Zaleigh Bermingham/Extender: Skipper Cliche in Treatment: 2 Vital Signs Time Taken: 15:16 Temperature (F): 98 Height (in): 62 Pulse (bpm): 65 Weight (lbs): 188 Respiratory Rate (breaths/min): 18 Body Mass Index (BMI): 34.4 Blood Pressure (mmHg): 132/78 Reference Range: 80 - 120 mg / dl Electronic Signature(s) Signed: 10/08/2021 3:17:27 PM By: Gretta Cool, BSN, RN, CWS, Kim RN, BSN Entered By: Gretta Cool, BSN, RN, CWS, Kim on 10/05/2021 15:16:51

## 2021-10-09 ENCOUNTER — Telehealth: Payer: Self-pay | Admitting: Cardiovascular Disease

## 2021-10-09 ENCOUNTER — Ambulatory Visit (INDEPENDENT_AMBULATORY_CARE_PROVIDER_SITE_OTHER): Payer: Medicare Other | Admitting: Vascular Surgery

## 2021-10-09 DIAGNOSIS — R001 Bradycardia, unspecified: Secondary | ICD-10-CM | POA: Diagnosis not present

## 2021-10-09 NOTE — Telephone Encounter (Signed)
Leslie from [employer] calling with abnormal zio patch results. 

## 2021-10-09 NOTE — Telephone Encounter (Addendum)
Report in Epic: 1 run of Ventricular Tachycardia occurred lasting 5 beats with a max rate of 169 bpm (avg 151 bpm). Atrial Fibrillation occurred continuously (100% burden), ranging from 35-82 bpm (avg of 48 bpm). Isolated VEs were rare (<1.0%), VE Couplets were rare  (<1.0%), and no VE Triplets were present.   Noted Atrial Fibrillation 100 % burden.  Patient was in the hospital in Sept, 2022 with Hx of Mobitz type I AV block, EKG with AFIB.  From hospital note:  Persistent atrial fibrillation: Rate controlled.  Not on anticoagulation.  Patient will be high risk for bleeding with anticoagulation and DAPT.  Given the situation, she would benefit more from DAPT.  -Continue home Toprol-XL as above  Will forward to MD to review.

## 2021-10-10 NOTE — Progress Notes (Signed)
Date: 10/11/2021  Primary Care Physician: Lavone Orn, MD Primary Cardiologist:  Lauree Chandler, MD  Chief Complaint: New onset atrial fibrillation  History of Present Illness: 87 yo female with history of CAD, HTN, HLD, COPD, tobacco abuse, carotid artery disease, [place] 1 heart block and DM here today cardiac follow up. In July of 2010 she had an inferior MI treated with a drug eluting stent in the right coronary artery. She had residual 70% LAD stenosis and an ejection fraction of 60%.   She also has chronic shortness of breath and moderate chronic obstructive pulmonary disease by pulmonary function testing. She has continued to smoke despite advice against this.  Stress myoview 12/20/13 with no ischemia, normal LV function. Mild bilateral carotid artery disease by dopplers in January 2020.  She is here today for dilation of atrial fibrillation.  She was recently hospitalized in September for bilateral lower extremity cellulitis and COPD exacerbation as well as acute on chronic diastolic CHF exacerbation.  2D echo at that time showed normal LV function with EF 60 to 65%, indeterminate diastolic function due to underlying atrial fibrillation, moderate pulmonary hypertension with PASP 65 mmHg and chest x-ray with vascular congestion.  She was diuresed with IV Lasix and transition to p.o. Lasix 40 mg daily.   During that hospitalization she was found to be in atrial fibrillation and had not been anticoagulated.  Hospitalist decided not to anticoagulate because of high risk of bleeding with anticoagulation and DAPT.    She called the office a few weeks ago stating that she was feeling fatigued and the advanced home health nurse noted the patient's heart rate was in the 40s and normal for her is in the 50s at least.  She was instructed to stop the Toprol until seen in the officeA heart monitor was ordered as an outpatient showed 100% atrial fibrillation and 1 run of wide-complex  tachycardia at 5 beats.  She never stopped the Toprol and the 3 day ziopatch showed an average HR of 48bpm.  She denies any chest pain or pressure, PND, orthopnea, dizziness, palpitations or syncope. She has chronic DOE that she says is stable as long as the humidity is not high.  She has chronic LE edema and uses diuretics and compression hose.  She has recently been taking Lasix 40mg  daily due to increased LE edema but dose not think the Lasix is working as well as it used to and she is not urinating as much.  She is completely unaware that she is in atrial fibrillation.  She is compliant with her meds and is tolerating meds with no SE.      Past Medical History:  Diagnosis Date   ACUT DUOD ULCER W/HEMORR&PERF W/O MENTION OBST 10/05/2009   NSAID induced   ALLERGIC RHINITIS CAUSE UNSPECIFIED    ANEMIA-NOS    CAD (coronary artery disease) 06/08/2009   DEs RCA with 70% LAD and EF 60%   COPD    mild obst on PFTs 03/2010   Diabetes mellitus 06/2010 dx   Mild, diet controlled   GLAUCOMA    HYPERLIPIDEMIA    HYPERTENSION, BENIGN    MYOCARDIAL INFARCTION 06/08/2009   des to rca   Persistent atrial fibrillation ([place])    Dx 08/2021   TOBACCO ABUSE     Past Surgical History:  Procedure Laterality Date   HEMORRHOID SURGERY  1990   Right knee surgery      Current Outpatient Medications  Medication Sig Dispense Refill  ASPIRIN ADULT PO Take 81 mg by mouth daily.     clopidogrel (PLAVIX) 75 MG tablet TAKE ONE TABLET EVERY DAY 30 tablet 11   dorzolamide (TRUSOPT) 2 % ophthalmic solution Place 1 drop into both eyes 2 (two) times daily.     famotidine (PEPCID) 20 MG tablet Take 20 mg by mouth 2 (two) times daily.     Fluticasone-Umeclidin-Vilant (TRELEGY ELLIPTA) 100-62.5-25 MCG/INH AEPB Inhale 1 puff into the lungs daily. 1 each 1   furosemide (LASIX) 40 MG tablet Take 40 mg by mouth as needed for fluid.     gabapentin (NEURONTIN) 100 MG capsule Take 100-200 mg by mouth 2 (two) times daily.  Pt takes one capsule every am, two capsules every pm     latanoprost (XALATAN) 0.005 % ophthalmic solution Place 1 drop into both eyes in the morning.     losartan (COZAAR) 50 MG tablet Take 50 mg by mouth daily.     magnesium hydroxide (MILK OF MAGNESIA) 400 MG/5ML suspension Take 30 mLs by mouth as needed for mild constipation.     metoprolol succinate (TOPROL-XL) 25 MG 24 hr tablet Take 1 tablet (25 mg total) by mouth daily. (Patient taking differently: Take 25 mg by mouth daily.) 180 tablet 1   NITROSTAT 0.4 MG SL tablet DISSOLVE ONE TABLET UNDER TONGUE AS NEEDED FOR CHEST PAIN - MAY REPEAT TWICE-IF NO RELIEF GO TO NEAREST HOSPITAL ER 25 tablet 0   potassium chloride (KLOR-CON) 10 MEQ tablet Take 10 mEq by mouth 2 (two) times daily.     rosuvastatin (CRESTOR) 20 MG tablet Take 20 mg by mouth daily.     VENTOLIN HFA 108 (90 BASE) MCG/ACT inhaler INHALE ONE PUFF EVERY 6 HOURS AS NEEDED FOR SHORTNESS OF BREATH 18 g 2   amLODipine (NORVASC) 5 MG tablet Take 5 mg by mouth daily.     cefadroxil (DURICEF) 500 MG capsule Take 1 capsule (500 mg total) by mouth 2 (two) times daily. (Patient not taking: Reported on 10/11/2021) 10 capsule 0   ipratropium-albuterol (DUONEB) 0.5-2.5 (3) MG/3ML SOLN Take 3 mLs by nebulization every 6 (six) hours as needed (Shortness of breath, cough and/or wheeze). (Patient not taking: Reported on 10/11/2021) 360 mL    No current facility-administered medications for this visit.    Allergies  Allergen Reactions   Cyclobenzaprine     Other reaction(s): muscle relaxers-ulcer hemorrhage (hospitalized)   Lactose Intolerance (Gi)    Propoxyphene     Other reaction(s): pain meds, hallucination, vomiting   Atorvastatin Itching    Itching and myaliga   Meperidine Hcl Other (See Comments)    Not known   Pravastatin Rash    rash    Social History   Socioeconomic History   Marital status: Widowed    Spouse name: Not on file   Number of children: Not on file   Years of  education: Not on file   Highest education level: Not on file  Occupational History   Not on file  Tobacco Use   Smoking status: Every Day    Packs/day: 0.50    Years: 60.00    Pack years: 30.00    Types: Cigarettes   Smokeless tobacco: Never   Tobacco comments:    She lives in [place] with her significant other (Charles Hook)0  Vaping Use   Vaping Use: Never used  Substance and Sexual Activity   Alcohol use: Yes    Comment: Occassional   Drug use: No   Sexual activity:  Not on file  Other Topics Concern   Not on file  Social History Narrative   Lives in [place] with her SO - charles hook   Retired -Acupuncturist   Enjoys travel - live Engineer, maintenance (IT)   Social Determinants of Corporate investment banker Strain: Not on Ship broker Insecurity: Not on file  Transportation Needs: Not on file  Physical Activity: Not on file  Stress: Not on file  Social Connections: Not on file  Intimate Partner Violence: Not on file    Family History  Problem Relation Age of Onset   Hypertension Mother    Ulcers Mother    Stomach cancer Father    Stroke Maternal Grandmother    Heart attack Neg Hx     Review of Systems:  As stated in the HPI and otherwise negative.   BP (!) 144/72 (BP Location: Right Arm, Patient Position: Sitting, Cuff Size: Normal)   Pulse (!) 59   Ht 5\' 2"  (1.575 m)   Wt 197 lb 12.8 oz (89.7 kg)   SpO2 99%   BMI 36.18 kg/m   Physical Examination:  GEN: Well nourished, well developed in no acute distress HEENT: Normal NECK: No JVD; No carotid bruits LYMPHATICS: No lymphadenopathy CARDIAC: Irregularly irregular, no murmurs, rubs, gallops RESPIRATORY:  scattered expiratory wheezes ABDOMEN: Soft, non-tender, non-distended MUSCULOSKELETAL:  trace edema; No deformity  SKIN: Warm and dry NEUROLOGIC:  Alert and oriented x 3 PSYCHIATRIC:  Normal affect    EKG:  EKG is not ordered today   Recent Labs: 08/22/2021: B Natriuretic Peptide 288.9 08/23/2021:  ALT 24 08/24/2021: BUN 31; Creatinine, Ser 0.98; Hemoglobin 13.9; Magnesium 2.1; Platelets 265; Potassium 3.9; Sodium 136    Wt Readings from Last 3 Encounters:  10/11/21 197 lb 12.8 oz (89.7 kg)  09/05/21 194 lb 12.8 oz (88.4 kg)  08/27/21 203 lb 7.8 oz (92.3 kg)     Other studies Reviewed: Additional studies/ records that were reviewed today include: . Review of the above records demonstrates:   Assessment and Plan:   1. CAD without angina:  -She has moderate LAD stenosis noted on cath in 2010 and has continued smoking since then.  -she denies any anginal symptoms -She will continue prescription drug management with aspirin 81 mg daily, Plavix 75 mg daily and statin with as needed refills  -Will stop Toprol due to bradycardia  2. HYPERTENSION:  -Her BP is borderline controlled on exam today. -she was supposed to stop Toprol due to bradycardia but never stopped it>I have instructed her to stop Toprol now -increase Losartan to 100mg  daily and continue Amlodipine 5mg  daily -followup in HTN PharmD clinic in 1 week -I have personally reviewed and interpreted outside labs performed by patient's PCP which showed serum creatinine 0.95 on 09/03/2021  3. HYPERLIPIDEMIA:  -Lipids followed in primary care.  -Repeat FLP and ALT -She will continue prescription drug management with Crestor 20 mg daily with as needed refills   4. Carotid artery disease:  -Mild bilateral carotid disease by dopplers January 2020.   -Continue prescription drug management with aspirin and statin  5. Tobacco abuse: Smoking cessation is recommended.   6. History of Second degree AV block, type 1/First degree AV block:  -She has not had any dizziness -No indication for a pacemaker at this time  7.  Paroxysmal atrial fibrillation -She apparently had some A. fib and the hospital in September but cardiology was not consulted.   -She was discharged from the hospital she  was in atrial fibrillation and her heart  monitor demonstrated 100% atrial fibrillation which she is asymptomatic from at this time.   -CHADS2VASC score is 6 (female, age>75, HTN, CAD< DM) -She is on DAPT but this was for coronary disease documented in 2010 and received a DES to the RCA at that time.  She has not had any other stents placed since then. -Given her high CHA2DS2-VASc score I think she needs to be on anticoagulation.  Since her stent is over 58 years old I think it is fine to stop her Plavix and continue her on aspirin 81 mg daily and start a DOAC -start Eliquis 5 mg twice daily -She called the office a few weeks ago stating that she was feeling fatigued and the advanced home health nurse noted the patient's heart rate was in the 40s and normal for her is in the 50s at least.  She was instructed to stop the Toprol until seen in the office but she never did -3 day ziopatch a few days ago showed average HR 48bpm -I have told her to stop the Toprol -Refer her to atrial fibrillation clinic -she may benefit from restoring NSR as she has significant LE edema which has worsened recently and she thinks her Lasix is not working well anymore  Current medicines are reviewed at length with the patient today.  The patient does not have concerns regarding medicines.  The following changes have been made:  no change  Labs/ tests ordered today include:   No orders of the defined types were placed in this encounter.  Disposition:   FU with DR. McAlhany in 3-4 weeks  Signed, Fransico Him, MD 10/11/2021 10:59 AM    [facility] [place], [place],   [address] Phone: [phone]; Fax: [phone]

## 2021-10-10 NOTE — Telephone Encounter (Signed)
Her monitor shows continuous atrial fib per report. This was noted during her hospital admission in September but cardiology was not called to see her then. She may not be a good candidate for anti-coagulation but she needs to be seen in the office for further discussion. If there is room on a DOD schedule this week, APP schedule or my schedule next week. [facility] patient and reviewed this information and scheduled her with Dr. Mayford Knife who is the DOD tomorrow.  Pt was not aware of the atrial fibrillation.  We discussed that they will have a discussion about afib and about the potential for anticoagulation.  She voices understanding.

## 2021-10-11 ENCOUNTER — Other Ambulatory Visit: Payer: Self-pay

## 2021-10-11 ENCOUNTER — Encounter: Payer: Self-pay | Admitting: Cardiology

## 2021-10-11 ENCOUNTER — Ambulatory Visit (INDEPENDENT_AMBULATORY_CARE_PROVIDER_SITE_OTHER): Payer: Medicare Other | Admitting: Cardiology

## 2021-10-11 VITALS — BP 144/72 | HR 59 | Ht 62.0 in | Wt 197.8 lb

## 2021-10-11 DIAGNOSIS — Z720 Tobacco use: Secondary | ICD-10-CM | POA: Diagnosis not present

## 2021-10-11 DIAGNOSIS — E7800 Pure hypercholesterolemia, unspecified: Secondary | ICD-10-CM

## 2021-10-11 DIAGNOSIS — I480 Paroxysmal atrial fibrillation: Secondary | ICD-10-CM

## 2021-10-11 DIAGNOSIS — I4819 Other persistent atrial fibrillation: Secondary | ICD-10-CM | POA: Insufficient documentation

## 2021-10-11 DIAGNOSIS — I6523 Occlusion and stenosis of bilateral carotid arteries: Secondary | ICD-10-CM | POA: Diagnosis not present

## 2021-10-11 DIAGNOSIS — I10 Essential (primary) hypertension: Secondary | ICD-10-CM | POA: Diagnosis not present

## 2021-10-11 DIAGNOSIS — I2510 Atherosclerotic heart disease of native coronary artery without angina pectoris: Secondary | ICD-10-CM

## 2021-10-11 DIAGNOSIS — I440 Atrioventricular block, first degree: Secondary | ICD-10-CM | POA: Diagnosis not present

## 2021-10-11 MED ORDER — APIXABAN 5 MG PO TABS: 5.0000 mg | ORAL_TABLET | Freq: Two times a day (BID) | ORAL | 11 refills | Status: DC

## 2021-10-11 MED ORDER — LOSARTAN POTASSIUM 100 MG PO TABS: 100.0000 mg | ORAL_TABLET | Freq: Every day | ORAL | 3 refills | Status: DC

## 2021-10-11 NOTE — Patient Instructions (Signed)
Medication Instructions:  Your physician has recommended you make the following change in your medication: 1) STOP taking Toprol (metoprolol succinate)  2) STOP taking Plavix (clopidogrel)  3) INCREASE losartan to 100 mg daily  4) START taking Eliquis 5 mg twice daily  *If you need a refill on your cardiac medications before your next appointment, please call your pharmacy*   Lab Work: IN ONE WEEK: Fasting lipids and CMET  If you have labs (blood work) drawn today and your tests are completely normal, you will receive your results only by: MyChart Message (if you have MyChart) OR A paper copy in the mail If you have any lab test that is abnormal or we need to change your treatment, we will call you to review the results.  Follow-Up: At [facility], you and your health needs are our priority.  As part of our continuing mission to provide you with exceptional heart care, we have created designated Provider Care Teams.  These Care Teams include your primary Cardiologist (physician) and Advanced Practice Providers (APPs -  Physician Assistants and Nurse Practitioners) who all work together to provide you with the care you need, when you need it.  Your next appointment:   4 week(s)  The format for your next appointment:   In Person  Provider:   You may see Verne Carrow, MD or one of the following Advanced Practice Providers on your designated Care Team:   Ronie Spies, PA-C Jacolyn Reedy, PA-C  Other Instructions You have been referred to the atrial fibrillation clinic.

## 2021-10-11 NOTE — Addendum Note (Signed)
Addended by: Theresia Majors on: 10/11/2021 11:05 AM   Modules accepted: Orders

## 2021-10-15 ENCOUNTER — Encounter: Payer: Medicare Other | Admitting: Physician Assistant

## 2021-10-17 ENCOUNTER — Telehealth: Payer: Self-pay | Admitting: Cardiovascular Disease

## 2021-10-17 DIAGNOSIS — I872 Venous insufficiency (chronic) (peripheral): Secondary | ICD-10-CM | POA: Diagnosis not present

## 2021-10-17 DIAGNOSIS — E1151 Type 2 diabetes mellitus with diabetic peripheral angiopathy without gangrene: Secondary | ICD-10-CM | POA: Diagnosis not present

## 2021-10-17 DIAGNOSIS — L03115 Cellulitis of right lower limb: Secondary | ICD-10-CM | POA: Diagnosis not present

## 2021-10-17 DIAGNOSIS — L97829 Non-pressure chronic ulcer of other part of left lower leg with unspecified severity: Secondary | ICD-10-CM | POA: Diagnosis not present

## 2021-10-17 DIAGNOSIS — L97819 Non-pressure chronic ulcer of other part of right lower leg with unspecified severity: Secondary | ICD-10-CM | POA: Diagnosis not present

## 2021-10-17 DIAGNOSIS — L03116 Cellulitis of left lower limb: Secondary | ICD-10-CM | POA: Diagnosis not present

## 2021-10-17 MED ORDER — AMLODIPINE BESYLATE 10 MG PO TABS: 10.0000 mg | ORAL_TABLET | Freq: Every day | ORAL | 3 refills | Status: DC

## 2021-10-17 MED ORDER — LOSARTAN POTASSIUM 50 MG PO TABS: 50.0000 mg | ORAL_TABLET | Freq: Every day | ORAL | 3 refills | Status: DC

## 2021-10-17 NOTE — Telephone Encounter (Signed)
Patient states she was taking two 50 mg tablets but they came from [facility] instead of where she usually gets her meds ([facility]).    She voices understanding to cut back to losartan 50 mg and will increase amlodipine to 10 mg daily.  She will monitor rash and also her blood pressure.  I also let her [facility] nurse know this.  I am aware she needs a 4 week follow up per Dr. Mayford Knife.

## 2021-10-17 NOTE — Telephone Encounter (Signed)
Aimee, RN with [facility], is calling to see if there is anywhere the patient can be worked in for a 3-4 week follow-up with Dr. Clifton James. Patient was advised to do so by Dr. Mayford Knife, however, I do not see where Dr. Tessie Eke have any availability through January, 2023. Aimee, RN would alsolike to discuss any recent medication changes.

## 2021-10-17 NOTE — Telephone Encounter (Signed)
Pt saw Dr. Mayford Knife on 11/3 who increased her losartan from 50 mg to 100 mg daily. Patient has started itching on the higher dose of losartan.  She is scratching so bad she is causing herself to bleed.  She tolerated 50 mg daily without the itch.   Its the worst about an hour after she takes the medication.   I adv HH nurse pt should hold losartan for now and I will forward to Dr. Clifton James and PharmD for recommendations.  Dr. Mayford Knife wanted pt to be seen in 4 weeks.  There is currently no availability.  Did not tolerate metoprolol due to low HR.  BP today 150/64, HR 68, irregular.  Going to afib clinic 10/23/21.

## 2021-10-17 NOTE — Telephone Encounter (Signed)
Unusual for pt to experience itching on different dose of same med, could be a possibility they use different filler or inactive ingredient in the 100mg  dose. Could decrease her losartan back to 50mg  daily since she tolerated that better and increase her amlodipine from 5mg  to 10mg  daily instead for additional BP control. Otherwise could change losartan to ACEi or a different ARB.

## 2021-10-18 ENCOUNTER — Other Ambulatory Visit: Payer: Medicare Other | Admitting: *Deleted

## 2021-10-18 ENCOUNTER — Other Ambulatory Visit: Payer: Self-pay

## 2021-10-18 DIAGNOSIS — E7800 Pure hypercholesterolemia, unspecified: Secondary | ICD-10-CM | POA: Diagnosis not present

## 2021-10-18 DIAGNOSIS — I2510 Atherosclerotic heart disease of native coronary artery without angina pectoris: Secondary | ICD-10-CM | POA: Diagnosis not present

## 2021-10-18 DIAGNOSIS — I6523 Occlusion and stenosis of bilateral carotid arteries: Secondary | ICD-10-CM | POA: Diagnosis not present

## 2021-10-18 DIAGNOSIS — I10 Essential (primary) hypertension: Secondary | ICD-10-CM | POA: Diagnosis not present

## 2021-10-18 LAB — LIPID PANEL
Chol/HDL Ratio: 2.3 ratio (ref 0.0–4.4)
Cholesterol, Total: 125 mg/dL (ref 100–199)
HDL: 55 mg/dL (ref >39)
LDL Chol Calc (NIH): 43 mg/dL (ref 0–99)
Triglycerides: 163 mg/dL — ABNORMAL HIGH (ref 0–149)
VLDL Cholesterol Cal: 27 mg/dL (ref 5–40)

## 2021-10-18 LAB — COMPREHENSIVE METABOLIC PANEL
ALT: 18 IU/L (ref 0–32)
AST: 21 IU/L (ref 0–40)
Albumin/Globulin Ratio: 1.5 (ref 1.2–2.2)
Albumin: 3.8 g/dL (ref 3.6–4.6)
Alkaline Phosphatase: 166 IU/L — ABNORMAL HIGH (ref 44–121)
BUN/Creatinine Ratio: 23 (ref 12–28)
BUN: 16 mg/dL (ref 8–27)
Bilirubin Total: 0.7 mg/dL (ref 0.0–1.2)
CO2: 26 mmol/L (ref 20–29)
Calcium: 8.7 mg/dL (ref 8.7–10.3)
Chloride: 101 mmol/L (ref 96–106)
Creatinine, Ser: 0.7 mg/dL (ref 0.57–1.00)
Globulin, Total: 2.5 g/dL (ref 1.5–4.5)
Glucose: 114 mg/dL — ABNORMAL HIGH (ref 70–99)
Potassium: 3.7 mmol/L (ref 3.5–5.2)
Sodium: 140 mmol/L (ref 134–144)
Total Protein: 6.3 g/dL (ref 6.0–8.5)
eGFR: 84 mL/min/{1.73_m2} (ref >59)

## 2021-10-19 DIAGNOSIS — L03116 Cellulitis of left lower limb: Secondary | ICD-10-CM | POA: Diagnosis not present

## 2021-10-19 DIAGNOSIS — L97829 Non-pressure chronic ulcer of other part of left lower leg with unspecified severity: Secondary | ICD-10-CM | POA: Diagnosis not present

## 2021-10-19 DIAGNOSIS — L97819 Non-pressure chronic ulcer of other part of right lower leg with unspecified severity: Secondary | ICD-10-CM | POA: Diagnosis not present

## 2021-10-19 DIAGNOSIS — I872 Venous insufficiency (chronic) (peripheral): Secondary | ICD-10-CM | POA: Diagnosis not present

## 2021-10-19 DIAGNOSIS — L03115 Cellulitis of right lower limb: Secondary | ICD-10-CM | POA: Diagnosis not present

## 2021-10-19 DIAGNOSIS — E1151 Type 2 diabetes mellitus with diabetic peripheral angiopathy without gangrene: Secondary | ICD-10-CM | POA: Diagnosis not present

## 2021-10-23 ENCOUNTER — Ambulatory Visit (HOSPITAL_COMMUNITY)
Admission: RE | Admit: 2021-10-23 | Discharge: 2021-10-23 | Disposition: A | Discharge status: Home / Self Care | Payer: Medicare Other | Source: Ambulatory Visit | Attending: Physician Assistant | Admitting: Physician Assistant

## 2021-10-23 VITALS — BP 150/50 | HR 56 | Ht 62.0 in | Wt 196.0 lb

## 2021-10-23 DIAGNOSIS — E785 Hyperlipidemia, unspecified: Secondary | ICD-10-CM | POA: Insufficient documentation

## 2021-10-23 DIAGNOSIS — E669 Obesity, unspecified: Secondary | ICD-10-CM | POA: Diagnosis not present

## 2021-10-23 DIAGNOSIS — R001 Bradycardia, unspecified: Secondary | ICD-10-CM | POA: Diagnosis not present

## 2021-10-23 DIAGNOSIS — I10 Essential (primary) hypertension: Secondary | ICD-10-CM | POA: Diagnosis not present

## 2021-10-23 DIAGNOSIS — J449 Chronic obstructive pulmonary disease, unspecified: Secondary | ICD-10-CM | POA: Insufficient documentation

## 2021-10-23 DIAGNOSIS — I2510 Atherosclerotic heart disease of native coronary artery without angina pectoris: Secondary | ICD-10-CM | POA: Insufficient documentation

## 2021-10-23 DIAGNOSIS — I441 Atrioventricular block, second degree: Secondary | ICD-10-CM | POA: Diagnosis not present

## 2021-10-23 DIAGNOSIS — Z720 Tobacco use: Secondary | ICD-10-CM | POA: Insufficient documentation

## 2021-10-23 DIAGNOSIS — D6869 Other thrombophilia: Secondary | ICD-10-CM | POA: Diagnosis not present

## 2021-10-23 DIAGNOSIS — I779 Disorder of arteries and arterioles, unspecified: Secondary | ICD-10-CM | POA: Insufficient documentation

## 2021-10-23 DIAGNOSIS — I4819 Other persistent atrial fibrillation: Secondary | ICD-10-CM | POA: Insufficient documentation

## 2021-10-23 DIAGNOSIS — Z7901 Long term (current) use of anticoagulants: Secondary | ICD-10-CM | POA: Insufficient documentation

## 2021-10-23 DIAGNOSIS — Z6835 Body mass index (BMI) 35.0-35.9, adult: Secondary | ICD-10-CM | POA: Insufficient documentation

## 2021-10-23 DIAGNOSIS — E119 Type 2 diabetes mellitus without complications: Secondary | ICD-10-CM | POA: Diagnosis not present

## 2021-10-23 DIAGNOSIS — Z7182 Exercise counseling: Secondary | ICD-10-CM | POA: Insufficient documentation

## 2021-10-23 MED ORDER — LOSARTAN POTASSIUM 50 MG PO TABS: 50.0000 mg | ORAL_TABLET | Freq: Two times a day (BID) | ORAL | Status: DC

## 2021-10-23 NOTE — Progress Notes (Signed)
Primary Care Physician: Kirby Funk, MD Primary Cardiologist: Dr Clifton James  Primary Electrophysiologist: none Referring Physician: Dr Florence Canner is a 87 y.o. female with a history of CAD, HTN, HLD, COPD, tobacco abuse, carotid artery disease, Mobitz 1 heart block, DM, and atrial fibrillation who presents for consultation in the [facility]rial [facility]ibr[facility]. She was hospitalized in 08/2021 for bilateral lower extremity cellulitis and COPD exacerbation as well as acute on chronic diastolic CHF exacerbation.  2D echo at that time showed normal LV function with EF 60 to 65%, moderate pulmonary hypertension with PASP 65 mmHg and chest x-ray with vascular congestion.  She was diuresed with IV Lasix and transition to p.o. Lasix 40 mg daily. During that hospitalization she was found to be in atrial fibrillation and had not been anticoagulated.  Hospitalist decided not to anticoagulate because of high risk of bleeding with anticoagulation and DAPT.   She was seen by Dr Mayford Knife on 10/11/21 and started on Eliquis for a CHADS2VASC score of 6. Zio monitor showed 100% afib burden with bradycardic/controlled rates. Unclear how symptomatic she is with her afib. Her edema and weight are stable.   Today, she denies symptoms of palpitations, chest pain, orthopnea, PND, lower extremity edema, dizziness, presyncope, syncope, snoring, daytime somnolence, bleeding, or neurologic sequela. The patient is tolerating medications without difficulties and is otherwise without complaint today.    Atrial Fibrillation Risk Factors:  she does not have symptoms or diagnosis of sleep apnea. she does not have a history of rheumatic fever. she does not have a history of alcohol use. The patient does not have a history of early familial atrial fibrillation or other arrhythmias.  she has a BMI of Body mass index is 35.85 kg/m.Marland Kitchen Filed Weights   10/23/21 1339  Weight: 88.9 kg    Family History  Problem  Relation Age of Onset   Hypertension Mother    Ulcers Mother    Stomach cancer Father    Stroke Maternal Grandmother    Heart attack Neg Hx      Atrial Fibrillation Management history:  Previous antiarrhythmic drugs: none Previous cardioversions: none Previous ablations: none CHADS2VASC score: 6 Anticoagulation history: Eliquis   Past Medical History:  Diagnosis Date   ACUT DUOD ULCER W/HEMORR&PERF W/O MENTION OBST 10/05/2009   NSAID induced   ALLERGIC RHINITIS CAUSE UNSPECIFIED    ANEMIA-NOS    CAD (coronary artery disease) 06/08/2009   DEs RCA with 70% LAD and EF 60%   COPD    mild obst on PFTs 03/2010   Diabetes mellitus 06/2010 dx   Mild, diet controlled   GLAUCOMA    HYPERLIPIDEMIA    HYPERTENSION, BENIGN    MYOCARDIAL INFARCTION 06/08/2009   des to rca   Persistent atrial fibrillation (HCC)    Dx 08/2021   TOBACCO ABUSE    Past Surgical History:  Procedure Laterality Date   HEMORRHOID SURGERY  1990   Right knee surgery      Current Outpatient Medications  Medication Sig Dispense Refill   amLODipine (NORVASC) 10 MG tablet Take 1 tablet (10 mg total) by mouth daily. 90 tablet 3   apixaban (ELIQUIS) 5 MG TABS tablet Take 1 tablet (5 mg total) by mouth 2 (two) times daily. 60 tablet 11   ASPIRIN ADULT PO Take 81 mg by mouth daily.     dorzolamide (TRUSOPT) 2 % ophthalmic solution Place 1 drop into both eyes 2 (two) times daily.  famotidine (PEPCID) 20 MG tablet Take 20 mg by mouth 2 (two) times daily.     Fluticasone-Umeclidin-Vilant (TRELEGY ELLIPTA) 100-62.5-25 MCG/INH AEPB Inhale 1 puff into the lungs daily. 1 each 1   furosemide (LASIX) 40 MG tablet Take 40 mg by mouth as needed for fluid.     gabapentin (NEURONTIN) 100 MG capsule Take 100-200 mg by mouth 2 (two) times daily. Pt takes one capsule every am, two capsules every pm     latanoprost (XALATAN) 0.005 % ophthalmic solution Place 1 drop into both eyes in the morning.     magnesium hydroxide (MILK  OF MAGNESIA) 400 MG/5ML suspension Take 30 mLs by mouth as needed for mild constipation.     NITROSTAT 0.4 MG SL tablet DISSOLVE ONE TABLET UNDER TONGUE AS NEEDED FOR CHEST PAIN - MAY REPEAT TWICE-IF NO RELIEF GO TO NEAREST HOSPITAL ER 25 tablet 0   potassium chloride (KLOR-CON) 10 MEQ tablet Take 10 mEq by mouth 2 (two) times daily.     rosuvastatin (CRESTOR) 20 MG tablet Take 20 mg by mouth daily.     VENTOLIN HFA 108 (90 BASE) MCG/ACT inhaler INHALE ONE PUFF EVERY 6 HOURS AS NEEDED FOR SHORTNESS OF BREATH 18 g 2   losartan (COZAAR) 50 MG tablet Take 1 tablet (50 mg total) by mouth in the morning and at bedtime.     No current facility-administered medications for this encounter.    Allergies  Allergen Reactions   Cyclobenzaprine     Other reaction(s): muscle relaxers-ulcer hemorrhage (hospitalized)   Lactose Intolerance (Gi)    Propoxyphene     Other reaction(s): pain meds, hallucination, vomiting   Atorvastatin Itching    Itching and myaliga   Meperidine Hcl Other (See Comments)    Not known   Pravastatin Rash    rash    Social History   Socioeconomic History   Marital status: Widowed    Spouse name: Not on file   Number of children: Not on file   Years of education: Not on file   Highest education level: Not on file  Occupational History   Not on file  Tobacco Use   Smoking status: Every Day    Packs/day: 0.50    Years: 60.00    Pack years: 30.00    Types: Cigarettes   Smokeless tobacco: Never   Tobacco comments:    She lives in [place] with her significant other (Charles Hook)0  Vaping Use   Vaping Use: Never used  Substance and Sexual Activity   Alcohol use: Yes    Comment: Occassional   Drug use: No   Sexual activity: Not on file  Other Topics Concern   Not on file  Social History Narrative   Lives in [place] with her SO - charles hook   Retired -Catering manager   Enjoys travel - live Photographer   Social Determinants of Systems developer Strain: Not on Art therapist Insecurity: Not on file  Transportation Needs: Not on file  Physical Activity: Not on file  Stress: Not on file  Social Connections: Not on file  Intimate Partner Violence: Not on file     ROS- All systems are reviewed and negative except as per the HPI above.  Physical Exam: Vitals:   10/23/21 1339  BP: (!) 150/50  Pulse: (!) 56  Weight: 88.9 kg  Height: 5\' 2"  (1.575 m)    GEN- The patient is a well appearing obese elderly female, alert and  oriented x 3 today.   Head- normocephalic, atraumatic Eyes-  Sclera clear, conjunctiva pink Ears- hearing intact Oropharynx- clear Neck- supple  Lungs- Clear to ausculation bilaterally, normal work of breathing Heart- irregular rate and rhythm, no murmurs, rubs or gallops  GI- soft, NT, ND, + BS Extremities- no clubbing, cyanosis, or edema MS- no significant deformity or atrophy Skin- no rash or lesion Psych- euthymic mood, full affect Neuro- strength and sensation are intact  Wt Readings from Last 3 Encounters:  10/23/21 88.9 kg  10/11/21 89.7 kg  09/05/21 88.4 kg    EKG today demonstrates  Afib with slow V response Vent. rate 56 BPM PR interval * ms QRS duration 76 ms QT/QTcB 436/420 ms  Echo 08/22/21 demonstrated   1. Left ventricular ejection fraction, by estimation, is 60 to 65%. The left ventricle has normal function. The left ventricle has no regional wall motion abnormalities. There is mild left ventricular hypertrophy. Left ventricular diastolic parameters are indeterminate.   2. Right ventricular systolic function is normal. The right ventricular  size is normal. There is severely elevated pulmonary artery systolic  pressure. The estimated right ventricular systolic pressure is 0000000 mmHg.   3. Left atrial size was mildly dilated.   4. Right atrial size was moderately dilated.   5. The mitral valve is normal in structure. Mild mitral valve  regurgitation. Mild mitral stenosis.    6. The inferior vena cava is dilated in size with <50% respiratory  variability, suggesting right atrial pressure of 15 mmHg.   Epic records are reviewed at length today  CHA2DS2-VASc Score = 6  The patient's score is based upon: CHF History: 0 HTN History: 1 Diabetes History: 1 Stroke History: 0 Vascular Disease History: 1 Age Score: 2 Gender Score: 1      ASSESSMENT AND PLAN: 1. Persistent Atrial Fibrillation (ICD10:  I48.19) The patient's CHA2DS2-VASc score is 6, indicating a 9.7% annual risk of stroke.   General education about afib provided and questions answered. We also discussed her stroke risk and the risks and benefits of anticoagulation. We discussed rate vs rhythm control. She would like to wait until after the holidays before considering DCCV. This would allow her to be adequately anticoagulated as well. Her bradycardia and baseline conduction disease limit medication options.  Continue Eliquis 5 mg BID (she had been only taking once per day)  2. Secondary Hypercoagulable State (ICD10:  D68.69) The patient is at significant risk for stroke/thromboembolism based upon her CHA2DS2-VASc Score of 6.  Continue Apixaban (Eliquis).   3. Obesity Body mass index is 35.85 kg/m. Lifestyle modification was discussed at length including regular exercise and weight reduction.  4. HTN Stable, no changes today.  5. CAD No anginal symptoms.   Follow up in the AF clinic in 2 months.    [facility] [address] [place], [place] [address] [phone] 10/23/2021 2:15 PM

## 2021-10-23 NOTE — Patient Instructions (Signed)
Take Eliquis- One tablet by mouth twice daily

## 2021-10-25 DIAGNOSIS — I482 Chronic atrial fibrillation, unspecified: Secondary | ICD-10-CM | POA: Diagnosis not present

## 2021-10-25 DIAGNOSIS — I272 Pulmonary hypertension, unspecified: Secondary | ICD-10-CM | POA: Diagnosis not present

## 2021-10-25 DIAGNOSIS — E1151 Type 2 diabetes mellitus with diabetic peripheral angiopathy without gangrene: Secondary | ICD-10-CM | POA: Diagnosis not present

## 2021-10-25 DIAGNOSIS — L97829 Non-pressure chronic ulcer of other part of left lower leg with unspecified severity: Secondary | ICD-10-CM | POA: Diagnosis not present

## 2021-10-25 DIAGNOSIS — L97819 Non-pressure chronic ulcer of other part of right lower leg with unspecified severity: Secondary | ICD-10-CM | POA: Diagnosis not present

## 2021-10-25 DIAGNOSIS — J449 Chronic obstructive pulmonary disease, unspecified: Secondary | ICD-10-CM | POA: Diagnosis not present

## 2021-10-25 DIAGNOSIS — L03115 Cellulitis of right lower limb: Secondary | ICD-10-CM | POA: Diagnosis not present

## 2021-10-25 DIAGNOSIS — L299 Pruritus, unspecified: Secondary | ICD-10-CM | POA: Diagnosis not present

## 2021-10-25 DIAGNOSIS — I2510 Atherosclerotic heart disease of native coronary artery without angina pectoris: Secondary | ICD-10-CM | POA: Diagnosis not present

## 2021-10-25 DIAGNOSIS — I10 Essential (primary) hypertension: Secondary | ICD-10-CM | POA: Diagnosis not present

## 2021-10-25 DIAGNOSIS — L03116 Cellulitis of left lower limb: Secondary | ICD-10-CM | POA: Diagnosis not present

## 2021-10-25 DIAGNOSIS — I872 Venous insufficiency (chronic) (peripheral): Secondary | ICD-10-CM | POA: Diagnosis not present

## 2021-10-26 DIAGNOSIS — L97829 Non-pressure chronic ulcer of other part of left lower leg with unspecified severity: Secondary | ICD-10-CM | POA: Diagnosis not present

## 2021-10-26 DIAGNOSIS — L97819 Non-pressure chronic ulcer of other part of right lower leg with unspecified severity: Secondary | ICD-10-CM | POA: Diagnosis not present

## 2021-10-26 DIAGNOSIS — L03116 Cellulitis of left lower limb: Secondary | ICD-10-CM | POA: Diagnosis not present

## 2021-10-26 DIAGNOSIS — L03115 Cellulitis of right lower limb: Secondary | ICD-10-CM | POA: Diagnosis not present

## 2021-10-26 DIAGNOSIS — I872 Venous insufficiency (chronic) (peripheral): Secondary | ICD-10-CM | POA: Diagnosis not present

## 2021-10-26 DIAGNOSIS — E1151 Type 2 diabetes mellitus with diabetic peripheral angiopathy without gangrene: Secondary | ICD-10-CM | POA: Diagnosis not present

## 2021-10-27 DIAGNOSIS — I872 Venous insufficiency (chronic) (peripheral): Secondary | ICD-10-CM | POA: Diagnosis not present

## 2021-10-27 DIAGNOSIS — E669 Obesity, unspecified: Secondary | ICD-10-CM | POA: Diagnosis not present

## 2021-10-27 DIAGNOSIS — I110 Hypertensive heart disease with heart failure: Secondary | ICD-10-CM | POA: Diagnosis not present

## 2021-10-27 DIAGNOSIS — Z9181 History of falling: Secondary | ICD-10-CM | POA: Diagnosis not present

## 2021-10-27 DIAGNOSIS — I6523 Occlusion and stenosis of bilateral carotid arteries: Secondary | ICD-10-CM | POA: Diagnosis not present

## 2021-10-27 DIAGNOSIS — Z7982 Long term (current) use of aspirin: Secondary | ICD-10-CM | POA: Diagnosis not present

## 2021-10-27 DIAGNOSIS — Z6837 Body mass index (BMI) 37.0-37.9, adult: Secondary | ICD-10-CM | POA: Diagnosis not present

## 2021-10-27 DIAGNOSIS — I2510 Atherosclerotic heart disease of native coronary artery without angina pectoris: Secondary | ICD-10-CM | POA: Diagnosis not present

## 2021-10-27 DIAGNOSIS — F17210 Nicotine dependence, cigarettes, uncomplicated: Secondary | ICD-10-CM | POA: Diagnosis not present

## 2021-10-27 DIAGNOSIS — E785 Hyperlipidemia, unspecified: Secondary | ICD-10-CM | POA: Diagnosis not present

## 2021-10-27 DIAGNOSIS — Z7901 Long term (current) use of anticoagulants: Secondary | ICD-10-CM | POA: Diagnosis not present

## 2021-10-27 DIAGNOSIS — I4819 Other persistent atrial fibrillation: Secondary | ICD-10-CM | POA: Diagnosis not present

## 2021-10-27 DIAGNOSIS — J449 Chronic obstructive pulmonary disease, unspecified: Secondary | ICD-10-CM | POA: Diagnosis not present

## 2021-10-27 DIAGNOSIS — I5033 Acute on chronic diastolic (congestive) heart failure: Secondary | ICD-10-CM | POA: Diagnosis not present

## 2021-10-27 DIAGNOSIS — E1151 Type 2 diabetes mellitus with diabetic peripheral angiopathy without gangrene: Secondary | ICD-10-CM | POA: Diagnosis not present

## 2021-10-27 DIAGNOSIS — H409 Unspecified glaucoma: Secondary | ICD-10-CM | POA: Diagnosis not present

## 2021-10-30 DIAGNOSIS — E1151 Type 2 diabetes mellitus with diabetic peripheral angiopathy without gangrene: Secondary | ICD-10-CM | POA: Diagnosis not present

## 2021-10-30 DIAGNOSIS — I110 Hypertensive heart disease with heart failure: Secondary | ICD-10-CM | POA: Diagnosis not present

## 2021-10-30 DIAGNOSIS — I5033 Acute on chronic diastolic (congestive) heart failure: Secondary | ICD-10-CM | POA: Diagnosis not present

## 2021-10-30 DIAGNOSIS — I4819 Other persistent atrial fibrillation: Secondary | ICD-10-CM | POA: Diagnosis not present

## 2021-10-30 DIAGNOSIS — I2510 Atherosclerotic heart disease of native coronary artery without angina pectoris: Secondary | ICD-10-CM | POA: Diagnosis not present

## 2021-10-30 DIAGNOSIS — J449 Chronic obstructive pulmonary disease, unspecified: Secondary | ICD-10-CM | POA: Diagnosis not present

## 2021-11-03 ENCOUNTER — Other Ambulatory Visit: Payer: Self-pay

## 2021-11-03 ENCOUNTER — Encounter: Payer: Self-pay | Admitting: Emergency Medicine

## 2021-11-03 ENCOUNTER — Emergency Department: Payer: Medicare Other

## 2021-11-03 ENCOUNTER — Inpatient Hospital Stay
Admission: EM | Admit: 2021-11-03 | Discharge: 2021-11-06 | DRG: 291 | Disposition: A | Discharge status: Home Health | Payer: Medicare Other | Attending: Internal Medicine | Admitting: Internal Medicine

## 2021-11-03 DIAGNOSIS — E739 Lactose intolerance, unspecified: Secondary | ICD-10-CM | POA: Diagnosis present

## 2021-11-03 DIAGNOSIS — I110 Hypertensive heart disease with heart failure: Principal | ICD-10-CM | POA: Diagnosis present

## 2021-11-03 DIAGNOSIS — Z800 Family history of malignant neoplasm of digestive organs: Secondary | ICD-10-CM

## 2021-11-03 DIAGNOSIS — I2510 Atherosclerotic heart disease of native coronary artery without angina pectoris: Secondary | ICD-10-CM | POA: Diagnosis present

## 2021-11-03 DIAGNOSIS — F17210 Nicotine dependence, cigarettes, uncomplicated: Secondary | ICD-10-CM | POA: Diagnosis present

## 2021-11-03 DIAGNOSIS — Z20822 Contact with and (suspected) exposure to covid-19: Secondary | ICD-10-CM | POA: Diagnosis present

## 2021-11-03 DIAGNOSIS — Z8249 Family history of ischemic heart disease and other diseases of the circulatory system: Secondary | ICD-10-CM | POA: Diagnosis not present

## 2021-11-03 DIAGNOSIS — K30 Functional dyspepsia: Secondary | ICD-10-CM | POA: Diagnosis present

## 2021-11-03 DIAGNOSIS — J441 Chronic obstructive pulmonary disease with (acute) exacerbation: Secondary | ICD-10-CM | POA: Diagnosis not present

## 2021-11-03 DIAGNOSIS — E785 Hyperlipidemia, unspecified: Secondary | ICD-10-CM | POA: Diagnosis present

## 2021-11-03 DIAGNOSIS — E119 Type 2 diabetes mellitus without complications: Secondary | ICD-10-CM | POA: Diagnosis present

## 2021-11-03 DIAGNOSIS — J439 Emphysema, unspecified: Secondary | ICD-10-CM | POA: Diagnosis not present

## 2021-11-03 DIAGNOSIS — I509 Heart failure, unspecified: Secondary | ICD-10-CM

## 2021-11-03 DIAGNOSIS — J811 Chronic pulmonary edema: Secondary | ICD-10-CM | POA: Diagnosis not present

## 2021-11-03 DIAGNOSIS — Z955 Presence of coronary angioplasty implant and graft: Secondary | ICD-10-CM | POA: Diagnosis not present

## 2021-11-03 DIAGNOSIS — Z79899 Other long term (current) drug therapy: Secondary | ICD-10-CM

## 2021-11-03 DIAGNOSIS — J449 Chronic obstructive pulmonary disease, unspecified: Secondary | ICD-10-CM | POA: Diagnosis not present

## 2021-11-03 DIAGNOSIS — Z7901 Long term (current) use of anticoagulants: Secondary | ICD-10-CM | POA: Diagnosis not present

## 2021-11-03 DIAGNOSIS — J9601 Acute respiratory failure with hypoxia: Secondary | ICD-10-CM | POA: Diagnosis not present

## 2021-11-03 DIAGNOSIS — I4819 Other persistent atrial fibrillation: Secondary | ICD-10-CM | POA: Diagnosis present

## 2021-11-03 DIAGNOSIS — J9811 Atelectasis: Secondary | ICD-10-CM | POA: Diagnosis present

## 2021-11-03 DIAGNOSIS — Z7982 Long term (current) use of aspirin: Secondary | ICD-10-CM

## 2021-11-03 DIAGNOSIS — I10 Essential (primary) hypertension: Secondary | ICD-10-CM

## 2021-11-03 DIAGNOSIS — I4891 Unspecified atrial fibrillation: Secondary | ICD-10-CM | POA: Diagnosis not present

## 2021-11-03 DIAGNOSIS — I5033 Acute on chronic diastolic (congestive) heart failure: Secondary | ICD-10-CM | POA: Diagnosis present

## 2021-11-03 DIAGNOSIS — Z823 Family history of stroke: Secondary | ICD-10-CM | POA: Diagnosis not present

## 2021-11-03 DIAGNOSIS — R0602 Shortness of breath: Secondary | ICD-10-CM | POA: Diagnosis not present

## 2021-11-03 DIAGNOSIS — I252 Old myocardial infarction: Secondary | ICD-10-CM

## 2021-11-03 DIAGNOSIS — I493 Ventricular premature depolarization: Secondary | ICD-10-CM | POA: Diagnosis present

## 2021-11-03 DIAGNOSIS — Z888 Allergy status to other drugs, medicaments and biological substances status: Secondary | ICD-10-CM | POA: Diagnosis not present

## 2021-11-03 DIAGNOSIS — H409 Unspecified glaucoma: Secondary | ICD-10-CM | POA: Diagnosis present

## 2021-11-03 DIAGNOSIS — J90 Pleural effusion, not elsewhere classified: Secondary | ICD-10-CM | POA: Diagnosis not present

## 2021-11-03 LAB — CBC WITH DIFFERENTIAL/PLATELET
Abs Immature Granulocytes: 0.03 10*3/uL (ref 0.00–0.07)
Basophils Absolute: 0.1 10*3/uL (ref 0.0–0.1)
Basophils Relative: 1 %
Eosinophils Absolute: 0.4 10*3/uL (ref 0.0–0.5)
Eosinophils Relative: 5 %
HCT: 46.8 % — ABNORMAL HIGH (ref 36.0–46.0)
Hemoglobin: 14.9 g/dL (ref 12.0–15.0)
Immature Granulocytes: 0 %
Lymphocytes Relative: 7 %
Lymphs Abs: 0.5 10*3/uL — ABNORMAL LOW (ref 0.7–4.0)
MCH: 30.6 pg (ref 26.0–34.0)
MCHC: 31.8 g/dL (ref 30.0–36.0)
MCV: 96.1 fL (ref 80.0–100.0)
Monocytes Absolute: 0.5 10*3/uL (ref 0.1–1.0)
Monocytes Relative: 8 %
Neutro Abs: 5.4 10*3/uL (ref 1.7–7.7)
Neutrophils Relative %: 79 %
Platelets: 216 10*3/uL (ref 150–400)
RBC: 4.87 MIL/uL (ref 3.87–5.11)
RDW: 15.4 % (ref 11.5–15.5)
WBC: 6.9 10*3/uL (ref 4.0–10.5)
nRBC: 0 % (ref 0.0–0.2)

## 2021-11-03 LAB — COMPREHENSIVE METABOLIC PANEL
ALT: 18 U/L (ref 0–44)
AST: 17 U/L (ref 15–41)
Albumin: 3.6 g/dL (ref 3.5–5.0)
Alkaline Phosphatase: 134 U/L — ABNORMAL HIGH (ref 38–126)
Anion gap: 7 (ref 5–15)
BUN: 22 mg/dL (ref 8–23)
CO2: 23 mmol/L (ref 22–32)
Calcium: 8.4 mg/dL — ABNORMAL LOW (ref 8.9–10.3)
Chloride: 107 mmol/L (ref 98–111)
Creatinine, Ser: 0.75 mg/dL (ref 0.44–1.00)
GFR, Estimated: >60 mL/min (ref >60)
Glucose, Bld: 155 mg/dL — ABNORMAL HIGH (ref 70–99)
Potassium: 4.2 mmol/L (ref 3.5–5.1)
Sodium: 137 mmol/L (ref 135–145)
Total Bilirubin: 1 mg/dL (ref 0.3–1.2)
Total Protein: 7.3 g/dL (ref 6.5–8.1)

## 2021-11-03 LAB — RESP PANEL BY RT-PCR (FLU A&B, COVID) ARPGX2
Influenza A by PCR: NEGATIVE
Influenza B by PCR: NEGATIVE
SARS Coronavirus 2 by RT PCR: NEGATIVE

## 2021-11-03 LAB — TROPONIN I (HIGH SENSITIVITY): Troponin I (High Sensitivity): 10 ng/L (ref <18)

## 2021-11-03 LAB — BRAIN NATRIURETIC PEPTIDE: B Natriuretic Peptide: 176.5 pg/mL — ABNORMAL HIGH (ref 0.0–100.0)

## 2021-11-03 MED ORDER — GABAPENTIN 100 MG PO CAPS
100.0000 mg | ORAL_CAPSULE | Freq: Two times a day (BID) | ORAL | Status: DC
Start: 2021-11-03 — End: 2021-11-03

## 2021-11-03 MED ORDER — APIXABAN 5 MG PO TABS
5.0000 mg | ORAL_TABLET | Freq: Two times a day (BID) | ORAL | Status: DC
  Administered 2021-11-03 – 2021-11-06 (×6): 5 mg via ORAL
  Filled 2021-11-03 (×6): qty 1

## 2021-11-03 MED ORDER — ACETAMINOPHEN 650 MG RE SUPP
650.0000 mg | Freq: Four times a day (QID) | RECTAL | Status: DC | PRN
  Filled 2021-11-03: qty 1

## 2021-11-03 MED ORDER — LATANOPROST 0.005 % OP SOLN
1.0000 [drp] | Freq: Every day | OPHTHALMIC | Status: DC
  Administered 2021-11-04 – 2021-11-06 (×3): 1 [drp] via OPHTHALMIC
  Filled 2021-11-03: qty 2.5

## 2021-11-03 MED ORDER — UMECLIDINIUM BROMIDE 62.5 MCG/ACT IN AEPB
1.0000 | INHALATION_SPRAY | Freq: Every day | RESPIRATORY_TRACT | Status: DC
  Administered 2021-11-04 – 2021-11-06 (×3): 1 via RESPIRATORY_TRACT
  Filled 2021-11-03 (×4): qty 7

## 2021-11-03 MED ORDER — SODIUM CHLORIDE 0.9 % IV SOLN: 250.0000 mL | INTRAVENOUS | Status: DC | PRN

## 2021-11-03 MED ORDER — SODIUM CHLORIDE 0.9% FLUSH: 3.0000 mL | INTRAVENOUS | Status: DC | PRN

## 2021-11-03 MED ORDER — AMLODIPINE BESYLATE 10 MG PO TABS
10.0000 mg | ORAL_TABLET | Freq: Every day | ORAL | Status: DC
  Administered 2021-11-04 – 2021-11-06 (×3): 10 mg via ORAL
  Filled 2021-11-03: qty 2
  Filled 2021-11-03: qty 1
  Filled 2021-11-03: qty 2

## 2021-11-03 MED ORDER — SODIUM CHLORIDE 0.9% FLUSH
3.0000 mL | Freq: Two times a day (BID) | INTRAVENOUS | Status: DC
  Administered 2021-11-04 – 2021-11-06 (×5): 3 mL via INTRAVENOUS

## 2021-11-03 MED ORDER — GABAPENTIN 100 MG PO CAPS
100.0000 mg | ORAL_CAPSULE | Freq: Every morning | ORAL | Status: DC
  Administered 2021-11-04 – 2021-11-06 (×3): 100 mg via ORAL
  Filled 2021-11-03 (×2): qty 1

## 2021-11-03 MED ORDER — FUROSEMIDE 10 MG/ML IJ SOLN
40.0000 mg | Freq: Once | INTRAMUSCULAR | Status: AC
  Administered 2021-11-03: 40 mg via INTRAVENOUS
  Filled 2021-11-03: qty 4

## 2021-11-03 MED ORDER — DORZOLAMIDE HCL 2 % OP SOLN
1.0000 [drp] | Freq: Two times a day (BID) | OPHTHALMIC | Status: DC
  Administered 2021-11-03 – 2021-11-06 (×6): 1 [drp] via OPHTHALMIC
  Filled 2021-11-03: qty 10

## 2021-11-03 MED ORDER — IPRATROPIUM-ALBUTEROL 0.5-2.5 (3) MG/3ML IN SOLN: 3.0000 mL | Freq: Once | RESPIRATORY_TRACT | Status: DC

## 2021-11-03 MED ORDER — FLUTICASONE-UMECLIDIN-VILANT 100-62.5-25 MCG/ACT IN AEPB: 1.0000 | INHALATION_SPRAY | Freq: Every day | RESPIRATORY_TRACT | Status: DC

## 2021-11-03 MED ORDER — PREDNISONE 20 MG PO TABS
60.0000 mg | ORAL_TABLET | Freq: Once | ORAL | Status: AC
  Administered 2021-11-03: 60 mg via ORAL
  Filled 2021-11-03: qty 3

## 2021-11-03 MED ORDER — POTASSIUM CHLORIDE CRYS ER 10 MEQ PO TBCR
10.0000 meq | EXTENDED_RELEASE_TABLET | Freq: Two times a day (BID) | ORAL | Status: DC
  Administered 2021-11-03 – 2021-11-06 (×6): 10 meq via ORAL
  Filled 2021-11-03 (×7): qty 1

## 2021-11-03 MED ORDER — FUROSEMIDE 10 MG/ML IJ SOLN
40.0000 mg | Freq: Every day | INTRAMUSCULAR | Status: DC
  Administered 2021-11-04 – 2021-11-06 (×3): 40 mg via INTRAVENOUS
  Filled 2021-11-03 (×3): qty 4

## 2021-11-03 MED ORDER — NITROGLYCERIN 0.4 MG SL SUBL: 0.4000 mg | SUBLINGUAL_TABLET | SUBLINGUAL | Status: DC | PRN

## 2021-11-03 MED ORDER — ONDANSETRON HCL 4 MG PO TABS: 4.0000 mg | ORAL_TABLET | Freq: Four times a day (QID) | ORAL | Status: DC | PRN

## 2021-11-03 MED ORDER — ALBUTEROL SULFATE HFA 108 (90 BASE) MCG/ACT IN AERS: INHALATION_SPRAY | RESPIRATORY_TRACT | 2 refills | Status: DC

## 2021-11-03 MED ORDER — GABAPENTIN 100 MG PO CAPS
200.0000 mg | ORAL_CAPSULE | Freq: Every day | ORAL | Status: DC
  Administered 2021-11-03 – 2021-11-05 (×3): 200 mg via ORAL
  Filled 2021-11-03 (×4): qty 2

## 2021-11-03 MED ORDER — ROSUVASTATIN CALCIUM 10 MG PO TABS
20.0000 mg | ORAL_TABLET | Freq: Every day | ORAL | Status: DC
  Administered 2021-11-04 – 2021-11-06 (×3): 20 mg via ORAL
  Filled 2021-11-03: qty 2
  Filled 2021-11-03 (×2): qty 1

## 2021-11-03 MED ORDER — ONDANSETRON HCL 4 MG/2ML IJ SOLN: 4.0000 mg | Freq: Four times a day (QID) | INTRAMUSCULAR | Status: DC | PRN

## 2021-11-03 MED ORDER — FLUTICASONE FUROATE-VILANTEROL 100-25 MCG/ACT IN AEPB
1.0000 | INHALATION_SPRAY | Freq: Every day | RESPIRATORY_TRACT | Status: DC
  Administered 2021-11-04 – 2021-11-06 (×3): 1 via RESPIRATORY_TRACT
  Filled 2021-11-03 (×2): qty 28

## 2021-11-03 MED ORDER — ACETAMINOPHEN 325 MG PO TABS
650.0000 mg | ORAL_TABLET | Freq: Four times a day (QID) | ORAL | Status: DC | PRN
  Administered 2021-11-05: 18:00:00 650 mg via ORAL
  Filled 2021-11-03: qty 2

## 2021-11-03 MED ORDER — LOSARTAN POTASSIUM 50 MG PO TABS
50.0000 mg | ORAL_TABLET | Freq: Every day | ORAL | Status: DC
  Administered 2021-11-04 – 2021-11-06 (×3): 50 mg via ORAL
  Filled 2021-11-03 (×3): qty 1

## 2021-11-03 MED ORDER — IPRATROPIUM-ALBUTEROL 0.5-2.5 (3) MG/3ML IN SOLN: 3.0000 mL | Freq: Four times a day (QID) | RESPIRATORY_TRACT | Status: DC | PRN

## 2021-11-03 MED ORDER — IPRATROPIUM-ALBUTEROL 0.5-2.5 (3) MG/3ML IN SOLN
9.0000 mL | Freq: Once | RESPIRATORY_TRACT | Status: AC
  Administered 2021-11-03: 9 mL via RESPIRATORY_TRACT
  Filled 2021-11-03: qty 9

## 2021-11-03 MED ORDER — ACETAMINOPHEN 500 MG PO TABS
1000.0000 mg | ORAL_TABLET | Freq: Once | ORAL | Status: AC
  Administered 2021-11-03: 1000 mg via ORAL
  Filled 2021-11-03: qty 2

## 2021-11-03 MED ORDER — ASPIRIN 81 MG PO CHEW: 81.0000 mg | CHEWABLE_TABLET | Freq: Every day | ORAL | Status: DC

## 2021-11-03 NOTE — ED Notes (Signed)
This RN to bedside, pt resting in bed with eyes closed, lights dimmed for patient comfort. Pt remains on 3L via [place] at this time. Purewick in place at this time. NAD noted.

## 2021-11-03 NOTE — ED Triage Notes (Signed)
Pt to ED via POV stating that she has been having shortness of breath since Wednesday. Pt states that she has hx/o A. Fib and COPD. Pt denies chest pain. Pt denies cough. Pt states that she has had some recent changes in her medications. Pt recently stopped smoking. Pt denies recent steroids or antibiotics. Pt has been using her inhalers but states that they are not helping. Pt is in NAD.

## 2021-11-03 NOTE — TOC Progression Note (Signed)
Transition of Care [facility]) - Progression Note    Patient Details  Name: MERTHA CLYATT MRN: [id number] Date of Birth: 05/14/1933  Transition of Care [facility]) CM/SW Contact  Allayne Butcher, RN Phone Number: 11/03/2021, 4:43 PM  Clinical Narrative:    Barbara Cower with [facility] notified RNCM that patient is active with them for [facility] RN.          Expected Discharge Plan and Services                                                 Social Determinants of Health (SDOH) Interventions    Readmission Risk Interventions No flowsheet data found.

## 2021-11-03 NOTE — H&P (Signed)
History and Physical    EIMY PLAZA MWU:[id number] DOB: 1933-03-14 DOA: 11/03/2021  PCP: Kirby Funk, MD   Patient coming from: Home  I have personally briefly reviewed patient's old medical records in [facility]  Chief Complaint: Shortness of breath x3 days  HPI: Jenny [name] is a 87 y.o. female with medical history significant for coronary artery disease status post PCI with stent angioplasty, hypertension, bilateral carotid artery stenosis, history of A. fib on chronic anticoagulation therapy, COPD who presents to the emergency room for evaluation of a 3-day history of shortness of breath mostly with exertion and relieved at rest associated with 2 pillow orthopnea and wheezing, chest wall pain associated.  She also endorses midsternal chest pain which she initially attributed to indigestion.  Pain is intermittent with radiation between her scapula blades but she denies having any associated nausea, no vomiting, no diaphoresis or palpitations.  She initially took Pepcid for the pain without relief and then she took nitroglycerin with complete resolution of her pain. She denies having any leg swelling.  She has no fever, no chills, no abdominal pain, no changes in her bowel habits, no urinary symptoms, no headache, no dizziness, no lightheadedness, no focal deficit, no blurred vision. Patient does not weigh herself daily and just got a scale couple of days ago but has been taking furosemide 10 mg as needed prescribed by her primary care provider.  She takes at least 1 tablet every other day. Sodium 137, potassium 4.2, chloride 107, bicarb 23, glucose 155, BUN 22, creatinine 0.75, calcium 8.4, alkaline phosphatase 134, albumin 3.6, AST 17, ALT 18, total protein 7.3, BNP 176.5, white count 6.9, hemoglobin 14.9, hematocrit 46.8, MCV 96.1, RDW 15.4, platelet count 216 Respiratory viral panel is negative Chest x-ray reviewed by me shows small bibasilar pleural effusions.  Hyperinflated lung  fields. Twelve-lead EKG reviewed by me shows A. fib with PVCs.   ED Course: Patient is an 87 year old female who presents to the ER for evaluation of shortness of breath mostly with exertion associated with intermittent chest pain and orthopnea. Chest x-ray shows findings consistent with CHF. Patient was ambulated in the emergency room and her pulse oximetry on room air dropped to 85% requiring oxygen supplementation at 2 L. She will be referred to observation status for further evaluation.      Review of Systems: As per HPI otherwise all other systems reviewed and negative.    Past Medical History:  Diagnosis Date   ACUT DUOD ULCER W/HEMORR&PERF W/O MENTION OBST 10/05/2009   NSAID induced   ALLERGIC RHINITIS CAUSE UNSPECIFIED    ANEMIA-NOS    CAD (coronary artery disease) 06/08/2009   DEs RCA with 70% LAD and EF 60%   COPD    mild obst on PFTs 03/2010   Diabetes mellitus 06/2010 dx   Mild, diet controlled   GLAUCOMA    HYPERLIPIDEMIA    HYPERTENSION, BENIGN    MYOCARDIAL INFARCTION 06/08/2009   des to rca   Persistent atrial fibrillation (HCC)    Dx 08/2021   TOBACCO ABUSE     Past Surgical History:  Procedure Laterality Date   HEMORRHOID SURGERY  1990   Right knee surgery       reports that she has been smoking cigarettes. She has a 30.00 pack-year smoking history. She has never used smokeless tobacco. She reports current alcohol use. She reports that she does not use drugs.  Allergies  Allergen Reactions   Cyclobenzaprine     Other  reaction(s): muscle relaxers-ulcer hemorrhage (hospitalized)   Lactose Intolerance (Gi)    Propoxyphene     Other reaction(s): pain meds, hallucination, vomiting   Atorvastatin Itching    Itching and myaliga   Meperidine Hcl Other (See Comments)    Not known   Pravastatin Rash    rash    Family History  Problem Relation Age of Onset   Hypertension Mother    Ulcers Mother    Stomach cancer Father    Stroke Maternal  Grandmother    Heart attack Neg Hx       Prior to Admission medications   Medication Sig Start Date End Date Taking? Authorizing Provider  albuterol (VENTOLIN HFA) 108 (90 Base) MCG/ACT inhaler INHALE ONE PUFF EVERY 6 HOURS AS NEEDED FOR SHORTNESS OF BREATH 11/03/21   Lucrezia Starch, MD  amLODipine (NORVASC) 10 MG tablet Take 1 tablet (10 mg total) by mouth daily. 10/17/21   Burnell Blanks, MD  apixaban (ELIQUIS) 5 MG TABS tablet Take 1 tablet (5 mg total) by mouth 2 (two) times daily. 10/11/21   Sueanne Margarita, MD  ASPIRIN ADULT PO Take 81 mg by mouth daily.    [provider]  dorzolamide (TRUSOPT) 2 % ophthalmic solution Place 1 drop into both eyes 2 (two) times daily.    [provider]  famotidine (PEPCID) 20 MG tablet Take 20 mg by mouth 2 (two) times daily.    [provider]  Fluticasone-Umeclidin-Vilant (TRELEGY ELLIPTA) 100-62.5-25 MCG/INH AEPB Inhale 1 puff into the lungs daily. 08/27/21   Mercy Riding, MD  furosemide (LASIX) 40 MG tablet Take 40 mg by mouth as needed for fluid. 09/16/18   [provider]  gabapentin (NEURONTIN) 100 MG capsule Take 100-200 mg by mouth 2 (two) times daily. Pt takes one capsule every am, two capsules every pm    [provider]  latanoprost (XALATAN) 0.005 % ophthalmic solution Place 1 drop into both eyes in the morning.    [provider]  losartan (COZAAR) 50 MG tablet Take 1 tablet (50 mg total) by mouth in the morning and at bedtime. 10/23/21   [provider]  magnesium hydroxide (MILK OF MAGNESIA) 400 MG/5ML suspension Take 30 mLs by mouth as needed for mild constipation.    [provider]  NITROSTAT 0.4 MG SL tablet DISSOLVE ONE TABLET UNDER TONGUE AS NEEDED FOR CHEST PAIN - MAY REPEAT TWICE-IF NO RELIEF GO TO NEAREST HOSPITAL ER 05/06/13   Rowe Clack, MD  potassium chloride (KLOR-CON) 10 MEQ tablet Take 10 mEq by mouth 2 (two) times daily. 07/09/21   [provider]  rosuvastatin (CRESTOR) 20 MG tablet Take 20 mg by mouth daily.    [provider]    Physical Exam: Vitals:   11/03/21 1105 11/03/21 1542 11/03/21 1639  BP: (!) 149/67 (!) 151/73 (!) 142/66  Pulse: 77 74 79  Resp: 20 (!) 21 (!) 25  Temp: 97.9 F (36.6 C)    TempSrc: Oral    SpO2: 93% 93% 95%  Weight: 88.5 kg    Height: 5\' 2"  (1.575 m)       Vitals:   11/03/21 1105 11/03/21 1542 11/03/21 1639  BP: (!) 149/67 (!) 151/73 (!) 142/66  Pulse: 77 74 79  Resp: 20 (!) 21 (!) 25  Temp: 97.9 F (36.6 C)    TempSrc: Oral    SpO2: 93% 93% 95%  Weight: 88.5 kg    Height: 5\' 2"  (1.575  m)        Constitutional: Alert and oriented x 3 . Not in any apparent distress HEENT:      Head: Normocephalic and atraumatic.         Eyes: PERLA, EOMI, Conjunctivae are normal. Sclera is non-icteric.       Mouth/Throat: Mucous membranes are moist.       Neck: Supple with no signs of meningismus. Cardiovascular: Irregularly irregular. No murmurs, gallops, or rubs. 2+ symmetrical distal pulses are present . No JVD. No LE edema Respiratory: Respiratory effort normal .crackles at the lung bases bilaterally.  Faint wheezes.  No rhonchi.  Gastrointestinal: Soft, non tender, and non distended with positive bowel sounds.  Genitourinary: No CVA tenderness. Musculoskeletal: Nontender with normal range of motion in all extremities. No cyanosis, or erythema of extremities. Neurologic:  Face is symmetric. Moving all extremities. No gross focal neurologic deficits . Skin: Skin is warm, dry.  No rash or ulcers Psychiatric: Mood and affect are normal    Labs on Admission: I have personally reviewed following labs and imaging studies  CBC: Recent Labs  Lab 11/03/21 1104  WBC 6.9  NEUTROABS 5.4  HGB 14.9  HCT 46.8*  MCV 96.1  PLT [id number]   Basic Metabolic Panel: Recent Labs  Lab 11/03/21 1104  NA 137  K 4.2  CL 107  CO2 23  GLUCOSE 155*  BUN 22  CREATININE 0.75  CALCIUM  8.4*   GFR: Estimated Creatinine Clearance: 52.2 mL/min (by C-G formula based on SCr of 0.75 mg/dL). Liver Function Tests: Recent Labs  Lab 11/03/21 1104  AST 17  ALT 18  ALKPHOS 134*  BILITOT 1.0  PROT 7.3  ALBUMIN 3.6   No results for input(s): LIPASE, AMYLASE in the last 168 hours. No results for input(s): AMMONIA in the last 168 hours. Coagulation Profile: No results for input(s): INR, PROTIME in the last 168 hours. Cardiac Enzymes: No results for input(s): CKTOTAL, CKMB, CKMBINDEX, TROPONINI in the last 168 hours. BNP (last 3 results) No results for input(s): PROBNP in the last 8760 hours. HbA1C: No results for input(s): HGBA1C in the last 72 hours. CBG: No results for input(s): GLUCAP in the last 168 hours. Lipid Profile: No results for input(s): CHOL, HDL, LDLCALC, TRIG, CHOLHDL, LDLDIRECT in the last 72 hours. Thyroid Function Tests: No results for input(s): TSH, T4TOTAL, FREET4, T3FREE, THYROIDAB in the last 72 hours. Anemia Panel: No results for input(s): VITAMINB12, FOLATE, FERRITIN, TIBC, IRON, RETICCTPCT in the last 72 hours. Urine analysis: No results found for: COLORURINE, APPEARANCEUR, LABSPEC, [place], GLUCOSEU, [place], BILIRUBINUR, KETONESUR, PROTEINUR, UROBILINOGEN, NITRITE, LEUKOCYTESUR  Radiological Exams on Admission: DG Chest 2 View  Result Date: 11/03/2021 CLINICAL DATA:  Shortness of breath since Wednesday, COPD, atrial fibrillation EXAM: CHEST - 2 VIEW COMPARISON:  08/22/2021 FINDINGS: Enlargement of cardiac silhouette with pulmonary vascular congestion. Atherosclerotic calcification aorta. Slight interstitial prominence versus previous exam consistent with mild pulmonary edema and CHF. Small bibasilar pleural effusions and minimal basilar atelectasis, new. Underlying flattening of the diaphragms suggesting COPD No pneumothorax. Bones demineralized. IMPRESSION: Mild CHF with small bibasilar pleural effusions and basilar atelectasis. Aortic  Atherosclerosis (ICD10-I70.0).  Emphysema (ICD10-J43.9). Electronically Signed   By: Lavonia Dana M.D.   On: 11/03/2021 11:38     Assessment/Plan Principal Problem:   Acute on chronic diastolic CHF (congestive heart failure) (HCC) Active Problems:   Essential hypertension   CAD, NATIVE VESSEL   COPD (chronic obstructive pulmonary disease) (HCC)   Type 2 diabetes, diet controlled (  [place])   Persistent atrial fibrillation [facility])      Patient is an 87 year old female admitted to the hospital for acute on chronic diastolic dysfunction CHF    Acute on chronic diastolic CHF Patient's last known LVEF is 60 to 65% from a 2D echocardiogram which was done in 09/22 Will place patient on Lasix 40 mg IV daily Continue low-sodium diet Continue amlodipine and Cozaar   History of atrial fibrillation Continue Eliquis as primary prophylaxis for an acute stroke    COPD Not acutely exacerbated Continue as needed bronchodilator therapy as well as inhaled steroids    Diet-controlled diabetes mellitus Maintain consistent carbohydrate diet Check blood sugars with meals   DVT prophylaxis: Eliquis  Code Status: full code  Family Communication: Greater than 50% of time was spent discussing patient's condition and plan of care with her at the bedside.  All questions and concerns have been addressed.  She verbalizes understanding and agrees to the plan. Disposition Plan: Back to previous home environment Consults called: none  Status:Observation    Verl Kitson MD [facility]     11/03/2021, 5:24 PM

## 2021-11-03 NOTE — ED Provider Notes (Signed)
[facility] Emergency Department Provider Note  ____________________________________________   Event Date/Time   First MD Initiated Contact with Patient 11/03/21 1429     (approximate)  I have reviewed the triage vital signs and the nursing notes.   HISTORY  Chief Complaint Shortness of Breath   HPI Jenny [name] is a 87 y.o. female with a past medical history of A. fib on Eliquis, CHF on as needed Lasix, COPD, and recently stop smoking about 2 weeks ago, CAD, HDL, and glaucoma who presents for assessment of 3 to 4 days of cough and shortness of breath.  Patient states she will have some intermittent chest tightness associate with this.  No hemoptysis.  No fevers, headache, earache, sore throat, abdominal pain, back pain, vomiting, diarrhea, burning with urination rash or extremity pain.  No recent falls or injuries.  She states she used inhalers at home but does not feel these are helping significantly.  No other acute concerns at this time.         Past Medical History:  Diagnosis Date   ACUT DUOD ULCER W/HEMORR&PERF W/O MENTION OBST 10/05/2009   NSAID induced   ALLERGIC RHINITIS CAUSE UNSPECIFIED    ANEMIA-NOS    CAD (coronary artery disease) 06/08/2009   DEs RCA with 70% LAD and EF 60%   COPD    mild obst on PFTs 03/2010   Diabetes mellitus 06/2010 dx   Mild, diet controlled   GLAUCOMA    HYPERLIPIDEMIA    HYPERTENSION, BENIGN    MYOCARDIAL INFARCTION 06/08/2009   des to rca   Persistent atrial fibrillation (HCC)    Dx 08/2021   TOBACCO ABUSE     Patient Active Problem List   Diagnosis Date Noted   Acute on chronic diastolic CHF (congestive heart failure) (HCC) 11/03/2021   Secondary hypercoagulable state (HCC) 10/23/2021   Persistent atrial fibrillation (HCC) 10/11/2021   Cellulitis 08/22/2021   Obese    Dyspepsia 04/05/2011   ALLERGIC RHINITIS CAUSE UNSPECIFIED 10/16/2010   COPD 05/21/2010   HYPERTENSION, BENIGN 04/06/2010   EDEMA  03/09/2010   ANEMIA-NOS 10/11/2009   GLAUCOMA 10/11/2009   CAD, NATIVE VESSEL 10/03/2009   HLD (hyperlipidemia) 07/12/2009   TOBACCO ABUSE 07/12/2009   MYOCARDIAL INFARCTION 07/12/2009   Type 2 diabetes, diet controlled (HCC) 07/12/2009    Past Surgical History:  Procedure Laterality Date   HEMORRHOID SURGERY  1990   Right knee surgery      Prior to Admission medications   Medication Sig Start Date End Date Taking? Authorizing Provider  albuterol (VENTOLIN HFA) 108 (90 Base) MCG/ACT inhaler INHALE ONE PUFF EVERY 6 HOURS AS NEEDED FOR SHORTNESS OF BREATH 11/03/21   Gilles Chiquito, MD  amLODipine (NORVASC) 10 MG tablet Take 1 tablet (10 mg total) by mouth daily. 10/17/21   Kathleene Hazel, MD  apixaban (ELIQUIS) 5 MG TABS tablet Take 1 tablet (5 mg total) by mouth 2 (two) times daily. 10/11/21   Quintella Reichert, MD  ASPIRIN ADULT PO Take 81 mg by mouth daily.    [provider]  dorzolamide (TRUSOPT) 2 % ophthalmic solution Place 1 drop into both eyes 2 (two) times daily.    [provider]  famotidine (PEPCID) 20 MG tablet Take 20 mg by mouth 2 (two) times daily.    [provider]  Fluticasone-Umeclidin-Vilant (TRELEGY ELLIPTA) 100-62.5-25 MCG/INH AEPB Inhale 1 puff into the lungs daily. 08/27/21   Almon Hercules, MD  furosemide (LASIX) 40 MG  tablet Take 40 mg by mouth as needed for fluid. 09/16/18   [provider]  gabapentin (NEURONTIN) 100 MG capsule Take 100-200 mg by mouth 2 (two) times daily. Pt takes one capsule every am, two capsules every pm    [provider]  latanoprost (XALATAN) 0.005 % ophthalmic solution Place 1 drop into both eyes in the morning.    [provider]  losartan (COZAAR) 50 MG tablet Take 1 tablet (50 mg total) by mouth in the morning and at bedtime. 10/23/21   [provider]  magnesium hydroxide (MILK OF MAGNESIA) 400 MG/5ML suspension Take 30 mLs by mouth as needed for mild constipation.     [provider]  NITROSTAT 0.4 MG SL tablet DISSOLVE ONE TABLET UNDER TONGUE AS NEEDED FOR CHEST PAIN - MAY REPEAT TWICE-IF NO RELIEF GO TO NEAREST HOSPITAL ER 05/06/13   Rowe Clack, MD  potassium chloride (KLOR-CON) 10 MEQ tablet Take 10 mEq by mouth 2 (two) times daily. 07/09/21   [provider]  rosuvastatin (CRESTOR) 20 MG tablet Take 20 mg by mouth daily.    [provider]    Allergies Cyclobenzaprine, Lactose intolerance (gi), Propoxyphene, Atorvastatin, Meperidine hcl, and Pravastatin  Family History  Problem Relation Age of Onset   Hypertension Mother    Ulcers Mother    Stomach cancer Father    Stroke Maternal Grandmother    Heart attack Neg Hx     Social History Social History   Tobacco Use   Smoking status: Every Day    Packs/day: 0.50    Years: 60.00    Pack years: 30.00    Types: Cigarettes   Smokeless tobacco: Never   Tobacco comments:    She lives in [place] with her significant other (Charles [place])0  Vaping Use   Vaping Use: Never used  Substance Use Topics   Alcohol use: Yes    Comment: Occassional   Drug use: No    Review of Systems  Review of Systems  Constitutional:  Negative for chills and fever.  HENT:  Negative for sore throat.   Eyes:  Negative for pain.  Respiratory:  Positive for cough and shortness of breath. Negative for stridor.   Cardiovascular:  Positive for chest pain.  Gastrointestinal:  Negative for vomiting.  Genitourinary:  Negative for dysuria.  Musculoskeletal:  Negative for myalgias.  Skin:  Negative for rash.  Neurological:  Negative for seizures, loss of consciousness and headaches.  Psychiatric/Behavioral:  Negative for suicidal ideas.   All other systems reviewed and are negative.    ____________________________________________   PHYSICAL EXAM:  VITAL SIGNS: ED Triage Vitals  Enc Vitals Group     BP 11/03/21 1105 (!) 149/67     Pulse Rate 11/03/21 1105 77     Resp 11/03/21 1105  20     Temp 11/03/21 1105 97.9 F (36.6 C)     Temp Source 11/03/21 1105 Oral     SpO2 11/03/21 1105 93 %     Weight 11/03/21 1105 195 lb (88.5 kg)     Height 11/03/21 1105 5\' 2"  (1.575 m)     Head Circumference --      Peak Flow --      Pain Score 11/03/21 1110 0     Pain Loc --      Pain Edu? --      Excl. in [place]? --    Vitals:   11/03/21 1542 11/03/21 1639  BP: (!) 151/73 (!) 142/66  Pulse: 74 79  Resp: (!) 21 (!) 25  Temp:    SpO2: 93% 95%   Physical Exam Vitals and nursing note reviewed.  Constitutional:      General: She is not in acute distress.    Appearance: She is well-developed. She is obese.  HENT:     Head: Normocephalic and atraumatic.  Eyes:     Conjunctiva/sclera: Conjunctivae normal.  Cardiovascular:     Rate and Rhythm: Normal rate and regular rhythm.     Heart sounds: No murmur heard. Pulmonary:     Effort: Pulmonary effort is normal. No respiratory distress.     Breath sounds: Examination of the right-middle field reveals wheezing. Examination of the left-middle field reveals wheezing. Examination of the right-lower field reveals wheezing. Examination of the left-lower field reveals wheezing. Decreased breath sounds and wheezing present. No rhonchi or rales.  Abdominal:     Palpations: Abdomen is soft.     Tenderness: There is no abdominal tenderness.  Musculoskeletal:        General: No swelling.     Cervical back: Neck supple.     Right lower leg: Edema present.     Left lower leg: Edema present.  Skin:    General: Skin is warm and dry.     Capillary Refill: Capillary refill takes less than 2 seconds.  Neurological:     Mental Status: She is alert.  Psychiatric:        Mood and Affect: Mood normal.     ____________________________________________   LABS (all labs ordered are listed, but only abnormal results are displayed)  Labs Reviewed  BRAIN NATRIURETIC PEPTIDE - Abnormal; Notable for the following components:      Result Value   B  Natriuretic Peptide 176.5 (*)    All other components within normal limits  CBC WITH DIFFERENTIAL/PLATELET - Abnormal; Notable for the following components:   HCT 46.8 (*)    Lymphs Abs 0.5 (*)    All other components within normal limits  COMPREHENSIVE METABOLIC PANEL - Abnormal; Notable for the following components:   Glucose, Bld 155 (*)    Calcium 8.4 (*)    Alkaline Phosphatase 134 (*)    All other components within normal limits  RESP PANEL BY RT-PCR (FLU A&B, COVID) ARPGX2  TROPONIN I (HIGH SENSITIVITY)   ____________________________________________  EKG  EKG remarkable for A. fib with a ventricular rate of 70, nonspecific change versus artifact in anterior and inferior leads without other clear evidence of acute ischemia. ____________________________________________  RADIOLOGY  ED MD interpretation: Chest x-ray shows small bilateral pleural effusions and a little atelectasis on the right without overt edema, focal consolidation, pneumothorax or other acute thoracic process.  Official radiology report(s): DG Chest 2 View  Result Date: 11/03/2021 CLINICAL DATA:  Shortness of breath since Wednesday, COPD, atrial fibrillation EXAM: CHEST - 2 VIEW COMPARISON:  08/22/2021 FINDINGS: Enlargement of cardiac silhouette with pulmonary vascular congestion. Atherosclerotic calcification aorta. Slight interstitial prominence versus previous exam consistent with mild pulmonary edema and CHF. Small bibasilar pleural effusions and minimal basilar atelectasis, new. Underlying flattening of the diaphragms suggesting COPD No pneumothorax. Bones demineralized. IMPRESSION: Mild CHF with small bibasilar pleural effusions and basilar atelectasis. Aortic Atherosclerosis (ICD10-I70.0).  Emphysema (ICD10-J43.9). Electronically Signed   By: Lavonia Dana M.D.   On: 11/03/2021 11:38    ____________________________________________   PROCEDURES  Procedure(s) performed (including Critical  Care):  .Critical Care E&M Performed by: Lucrezia Starch, MD  Critical care provider statement:  Critical care time (minutes):  45   Critical care time was exclusive of:  Separately billable procedures and treating other patients   Critical care was necessary to treat or prevent imminent or life-threatening deterioration of the following conditions:  Respiratory failure   Critical care was time spent personally by me on the following activities:  Development of treatment plan with patient or surrogate, ordering and performing treatments and interventions, ordering and review of laboratory studies, ordering and review of radiographic studies, pulse oximetry, re-evaluation of patient's condition, review of old charts, examination of patient and evaluation of patient's response to treatment After initial E/M assessment, critical care services were subsequently performed that were exclusive of separately billable procedures or treatment.     ____________________________________________   INITIAL IMPRESSION / ASSESSMENT AND PLAN / ED COURSE        Patient presents with above-stated history exam for assessment of 3 to 4 days of cough and shortness of breath.  Patient also endorses some intermittent chest tightness.  On arrival patient is hypertensive otherwise stable vital signs on room air.  She has diminished breath sounds and some mild Wheezing.  Differential includes COPD exacerbation, CHF, arrhythmia, anemia, ACS, pneumonia, pneumothorax with lower suspicion for PE at this time given patient states he is compliant with her Eliquis.  EKG remarkable for A. fib with a ventricular rate of 70, nonspecific change versus artifact in anterior and inferior leads without other clear evidence of acute ischemia.  Given nonelevated troponin obtained greater than 3 hours after symptom onset Evalose patient for ACS or myocarditis.  CMP without significant electrolyte metabolic derangements.  COVID  influenza PCR is negative.  CBC without leukocytosis or acute anemia.  BNP 176.5 compared to 288 2 months ago  Chest x-ray shows small bilateral pleural effusions and a little atelectasis on the right without overt edema, focal consolidation, pneumothorax or other acute thoracic process.   Concern for mild COPD exacerbation and possibly component of very mild volume overload despite absence of overt edema on x-ray and only minimally elevated BNP.  Patient given duo nebs and prednisone and following this trial of ambulation was attempted.  However patient desatted to 85% with minimal exertion which improved back to 95% on 2 L.  We will give a dose of Lasix admit to medicine service for hypoxic respiratory failure likely multifactorial but primarily with concern for COPD exacerbation.     ____________________________________________   FINAL CLINICAL IMPRESSION(S) / ED DIAGNOSES  Final diagnoses:  COPD exacerbation ([place])  Acute respiratory failure with hypoxia ([place])  Acute on chronic congestive heart failure, unspecified heart failure type (HCC)    Medications  acetaminophen (TYLENOL) tablet 1,000 mg (has no administration in time range)  ipratropium-albuterol (DUONEB) 0.5-2.5 (3) MG/3ML nebulizer solution 9 mL (9 mLs Nebulization Given 11/03/21 1539)  predniSONE (DELTASONE) tablet 60 mg (60 mg Oral Given 11/03/21 1539)  furosemide (LASIX) injection 40 mg (40 mg Intravenous Given 11/03/21 1642)     ED Discharge Orders          Ordered    albuterol (VENTOLIN HFA) 108 (90 Base) MCG/ACT inhaler        11/03/21 1511             Note:  This document was prepared using Dragon voice recognition software and may include unintentional dictation errors.    Lucrezia Starch, MD 11/03/21 [phone]

## 2021-11-03 NOTE — ED Provider Notes (Signed)
Emergency Medicine Provider Triage Evaluation Note  Jenny STRENG , a 87 y.o. female  was evaluated in triage.  Pt complains of shortness of breath for a few days.  Recently quit smoking.  Still has her inhalers at home.  Little bit of chest tightness, mostly dyspnea.  No cough..  Review of Systems  Positive: Shortness of breath and chest tightness Negative: Increased cough  Physical Exam  BP (!) 149/67 (BP Location: Left Arm)   Pulse 77   Temp 97.9 F (36.6 C) (Oral)   Resp 20   Ht 5\' 2"  (1.575 m)   Wt 88.5 kg   SpO2 93%   BMI 35.67 kg/m  Gen:   Awake, no distress   Resp:  Tachypneic with pursed lip breathing.  Decreased airflow. MSK:   Moves extremities without difficulty  Other:    Medical Decision Making  Medically screening exam initiated at 11:10 AM.  Appropriate orders placed.  [name] was informed that the remainder of the evaluation will be completed by another provider, this initial triage assessment does not replace that evaluation, and the importance of remaining in the ED until their evaluation is complete.     [name], MD 11/03/21 [phone]

## 2021-11-03 NOTE — ED Notes (Signed)
Pt ambulated approximately 72ft to the restroom. Pt O2 sat dropped to 85% on room air while ambulating. Pt wheeled back to stretcher and O2 placed on pt at 2L. O2 sat came up to 93% on 2L. Dr. Katrinka Blazing notified.

## 2021-11-04 DIAGNOSIS — I110 Hypertensive heart disease with heart failure: Secondary | ICD-10-CM | POA: Diagnosis present

## 2021-11-04 DIAGNOSIS — F17210 Nicotine dependence, cigarettes, uncomplicated: Secondary | ICD-10-CM | POA: Diagnosis present

## 2021-11-04 DIAGNOSIS — E785 Hyperlipidemia, unspecified: Secondary | ICD-10-CM | POA: Diagnosis present

## 2021-11-04 DIAGNOSIS — Z823 Family history of stroke: Secondary | ICD-10-CM | POA: Diagnosis not present

## 2021-11-04 DIAGNOSIS — J449 Chronic obstructive pulmonary disease, unspecified: Secondary | ICD-10-CM | POA: Diagnosis present

## 2021-11-04 DIAGNOSIS — I5033 Acute on chronic diastolic (congestive) heart failure: Secondary | ICD-10-CM | POA: Diagnosis present

## 2021-11-04 DIAGNOSIS — Z7901 Long term (current) use of anticoagulants: Secondary | ICD-10-CM | POA: Diagnosis not present

## 2021-11-04 DIAGNOSIS — I252 Old myocardial infarction: Secondary | ICD-10-CM | POA: Diagnosis not present

## 2021-11-04 DIAGNOSIS — Z800 Family history of malignant neoplasm of digestive organs: Secondary | ICD-10-CM | POA: Diagnosis not present

## 2021-11-04 DIAGNOSIS — E119 Type 2 diabetes mellitus without complications: Secondary | ICD-10-CM | POA: Diagnosis present

## 2021-11-04 DIAGNOSIS — K30 Functional dyspepsia: Secondary | ICD-10-CM | POA: Diagnosis present

## 2021-11-04 DIAGNOSIS — E739 Lactose intolerance, unspecified: Secondary | ICD-10-CM | POA: Diagnosis present

## 2021-11-04 DIAGNOSIS — Z888 Allergy status to other drugs, medicaments and biological substances status: Secondary | ICD-10-CM | POA: Diagnosis not present

## 2021-11-04 DIAGNOSIS — Z955 Presence of coronary angioplasty implant and graft: Secondary | ICD-10-CM | POA: Diagnosis not present

## 2021-11-04 DIAGNOSIS — I509 Heart failure, unspecified: Secondary | ICD-10-CM

## 2021-11-04 DIAGNOSIS — Z8249 Family history of ischemic heart disease and other diseases of the circulatory system: Secondary | ICD-10-CM | POA: Diagnosis not present

## 2021-11-04 DIAGNOSIS — I4819 Other persistent atrial fibrillation: Secondary | ICD-10-CM | POA: Diagnosis present

## 2021-11-04 DIAGNOSIS — I493 Ventricular premature depolarization: Secondary | ICD-10-CM | POA: Diagnosis present

## 2021-11-04 DIAGNOSIS — H409 Unspecified glaucoma: Secondary | ICD-10-CM | POA: Diagnosis present

## 2021-11-04 DIAGNOSIS — J9601 Acute respiratory failure with hypoxia: Secondary | ICD-10-CM | POA: Diagnosis present

## 2021-11-04 DIAGNOSIS — J9811 Atelectasis: Secondary | ICD-10-CM | POA: Diagnosis present

## 2021-11-04 DIAGNOSIS — I2510 Atherosclerotic heart disease of native coronary artery without angina pectoris: Secondary | ICD-10-CM | POA: Diagnosis present

## 2021-11-04 DIAGNOSIS — R0602 Shortness of breath: Secondary | ICD-10-CM | POA: Diagnosis present

## 2021-11-04 DIAGNOSIS — Z7982 Long term (current) use of aspirin: Secondary | ICD-10-CM | POA: Diagnosis not present

## 2021-11-04 DIAGNOSIS — Z79899 Other long term (current) drug therapy: Secondary | ICD-10-CM | POA: Diagnosis not present

## 2021-11-04 DIAGNOSIS — Z20822 Contact with and (suspected) exposure to covid-19: Secondary | ICD-10-CM | POA: Diagnosis present

## 2021-11-04 LAB — CBC
HCT: 43.7 % (ref 36.0–46.0)
Hemoglobin: 14.1 g/dL (ref 12.0–15.0)
MCH: 31.1 pg (ref 26.0–34.0)
MCHC: 32.3 g/dL (ref 30.0–36.0)
MCV: 96.3 fL (ref 80.0–100.0)
Platelets: 241 10*3/uL (ref 150–400)
RBC: 4.54 MIL/uL (ref 3.87–5.11)
RDW: 15.4 % (ref 11.5–15.5)
WBC: 6.7 10*3/uL (ref 4.0–10.5)
nRBC: 0 % (ref 0.0–0.2)

## 2021-11-04 LAB — BASIC METABOLIC PANEL
Anion gap: 7 (ref 5–15)
BUN: 26 mg/dL — ABNORMAL HIGH (ref 8–23)
CO2: 24 mmol/L (ref 22–32)
Calcium: 8.5 mg/dL — ABNORMAL LOW (ref 8.9–10.3)
Chloride: 107 mmol/L (ref 98–111)
Creatinine, Ser: 0.86 mg/dL (ref 0.44–1.00)
GFR, Estimated: >60 mL/min (ref >60)
Glucose, Bld: 197 mg/dL — ABNORMAL HIGH (ref 70–99)
Potassium: 4.4 mmol/L (ref 3.5–5.1)
Sodium: 138 mmol/L (ref 135–145)

## 2021-11-04 LAB — TROPONIN I (HIGH SENSITIVITY): Troponin I (High Sensitivity): 13 ng/L (ref <18)

## 2021-11-04 LAB — CBG MONITORING, ED: Glucose-Capillary: 95 mg/dL (ref 70–99)

## 2021-11-04 MED ORDER — INSULIN ASPART 100 UNIT/ML IJ SOLN: 0.0000 [IU] | Freq: Three times a day (TID) | INTRAMUSCULAR | Status: DC

## 2021-11-04 MED ORDER — ACETAMINOPHEN 325 MG PO TABS
650.0000 mg | ORAL_TABLET | Freq: Once | ORAL | Status: AC
  Administered 2021-11-04: 23:00:00 650 mg via ORAL
  Filled 2021-11-04: qty 2

## 2021-11-04 MED ORDER — INSULIN ASPART 100 UNIT/ML IJ SOLN: 0.0000 [IU] | Freq: Every day | INTRAMUSCULAR | Status: DC

## 2021-11-04 NOTE — ED Notes (Signed)
Per MD authorization, discontinued orders for CBG checks and insulin as this pt is not a diabetic.

## 2021-11-04 NOTE — ED Notes (Signed)
This RN to bedside due to patient calling out. Pt requesting a snack, provided with a snack per her request and remote to the TV. Pt denies further needs. Call bell remains within reach of patient at this time.

## 2021-11-04 NOTE — Progress Notes (Signed)
PROGRESS NOTE    Jenny [name]  [id number] DOB: 28-Feb-1933 DOA: 11/03/2021 PCP: Lavone Orn, MD   Chief Complaint  Patient presents with   Shortness of Breath   Brief Narrative:  87 yo F with hx CAD s/p PCI, HTN, bilateral carotid artery stenosis, hx atrial fib on chronic anticoagulation therapy, COPD who presented to the ED with 3 days of SOB.  She's been admitted with HF exacerbation.  Assessment & Plan:   Principal Problem:   Acute on chronic diastolic CHF (congestive heart failure) (HCC) Active Problems:   Essential hypertension   CAD, NATIVE VESSEL   COPD (chronic obstructive pulmonary disease) (HCC)   Type 2 diabetes, diet controlled (HCC)   Persistent atrial fibrillation (HCC)  Acute on chronic diastolic heart failure Acute hypoxic respiratory failure Currently on 3 L [place] CXR with mild CHF with small bibasilar effusions and basilar atelectasis Echo with 60-65%, mild LVH, severely elevated PASP Continue lasix 40 mg  Strict I/O, daily weights  Chest Discomfort Noted on admission, thought indigestion EKG with Feliberto Stockley fib Initial trop negative, follow 2nd  Hx Atrial Fibrillation Eliquis  COPD Breo ellipta, incruse ellipta  T2DM Follow A1c  HLD Crestor  HTN Losartan  eyedrops  DVT prophylaxis: eliquis Code Status: (full  Family Communication: none at bedside Disposition:   Status is: Observation  The patient will require care spanning > 2 midnights and should be moved to inpatient because: continued need for IV diuressi      Consultants:  none  Procedures: none  Antimicrobials: Anti-infectives (From admission, onward)    None          Subjective: Couldn't breath She's feeling Delsin Copen little better, not to her normal  Objective: Vitals:   11/04/21 0430 11/04/21 0503 11/04/21 0917 11/04/21 1333  BP: 140/66 133/63 136/79 (!) 123/57  Pulse: (!) 54 (!) 54 (!) 59 (!) 53  Resp: 20 17 20 20   Temp:   97.7 F (36.5 C) 98.3 F (36.8 C)   TempSrc:   Oral Oral  SpO2: 96% 97% 97% 97%  Weight:      Height:        Intake/Output Summary (Last 24 hours) at 11/04/2021 1535 Last data filed at 11/04/2021 0012 Gross per 24 hour  Intake --  Output 700 ml  Net -700 ml   Filed Weights   11/03/21 1105  Weight: 88.5 kg    Examination:  General exam: Appears calm and comfortable  Respiratory system: appears comfortable on 3 L Cardiovascular system: S1 & S2 heard, RRR.  Gastrointestinal system: Abdomen is nondistended, soft and nontender.  Central nervous system: Alert and oriented. No focal neurological deficits. Extremities: bilateral LE edema, improving Skin: No rashes, lesions or ulcers Psychiatry: Judgement and insight appear normal. Mood & affect appropriate.     Data Reviewed: I have personally reviewed following labs and imaging studies  CBC: Recent Labs  Lab 11/03/21 1104 11/04/21 0419  WBC 6.9 6.7  NEUTROABS 5.4  --   HGB 14.9 14.1  HCT 46.8* 43.7  MCV 96.1 96.3  PLT 216 [id number]    Basic Metabolic Panel: Recent Labs  Lab 11/03/21 1104 11/04/21 0419  NA 137 138  K 4.2 4.4  CL 107 107  CO2 23 24  GLUCOSE 155* 197*  BUN 22 26*  CREATININE 0.75 0.86  CALCIUM 8.4* 8.5*    GFR: Estimated Creatinine Clearance: 48.6 mL/min (by C-G formula based on SCr of 0.86 mg/dL).  Liver Function Tests: Recent Labs  Lab  11/03/21 1104  AST 17  ALT 18  ALKPHOS 134*  BILITOT 1.0  PROT 7.3  ALBUMIN 3.6    CBG: No results for input(s): GLUCAP in the last 168 hours.   Recent Results (from the past 240 hour(s))  Resp Panel by RT-PCR (Flu Pinchus Weckwerth&B, Covid) Nasopharyngeal Swab     Status: None   Collection Time: 11/03/21  3:45 PM   Specimen: Nasopharyngeal Swab; Nasopharyngeal(NP) swabs in vial transport medium  Result Value Ref Range Status   SARS Coronavirus 2 by RT PCR NEGATIVE NEGATIVE Final    Comment: (NOTE) SARS-CoV-2 target nucleic acids are NOT DETECTED.  The SARS-CoV-2 RNA is generally detectable  in upper respiratory specimens during the acute phase of infection. The lowest concentration of SARS-CoV-2 viral copies this assay can detect is 138 copies/mL. Md Smola negative result does not preclude SARS-Cov-2 infection and should not be used as the sole basis for treatment or other patient management decisions. Almedia Cordell negative result may occur with  improper specimen collection/handling, submission of specimen other than nasopharyngeal swab, presence of viral mutation(s) within the areas targeted by this assay, and inadequate number of viral copies(<138 copies/mL). Ruben Pyka negative result must be combined with clinical observations, patient history, and epidemiological information. The expected result is Negative.  Fact Sheet for Patients:  BloggerCourse.comhttps://www.fda.gov/media/152166/download  Fact Sheet for Healthcare Providers:  SeriousBroker.ithttps://www.fda.gov/media/152162/download  This test is no t yet approved or cleared by the [place] FDA and  has been authorized for detection and/or diagnosis of SARS-CoV-2 by FDA under an Emergency Use Authorization (EUA). This EUA will remain  in effect (meaning this test can be used) for the duration of the COVID-19 declaration under Section 564(b)(1) of the Act, 21 U.S.C.section 360bbb-3(b)(1), unless the authorization is terminated  or revoked sooner.       Influenza Kalvin Buss by PCR NEGATIVE NEGATIVE Final   Influenza B by PCR NEGATIVE NEGATIVE Final    Comment: (NOTE) The Xpert Xpress SARS-CoV-2/FLU/RSV plus assay is intended as an aid in the diagnosis of influenza from Nasopharyngeal swab specimens and should not be used as Gawain Crombie sole basis for treatment. Nasal washings and aspirates are unacceptable for Xpert Xpress SARS-CoV-2/FLU/RSV testing.  Fact Sheet for Patients: BloggerCourse.comhttps://www.fda.gov/media/152166/download  Fact Sheet for Healthcare Providers: SeriousBroker.ithttps://www.fda.gov/media/152162/download  This test is not yet approved or cleared by the [place] FDA and has  been authorized for detection and/or diagnosis of SARS-CoV-2 by FDA under an Emergency Use Authorization (EUA). This EUA will remain in effect (meaning this test can be used) for the duration of the COVID-19 declaration under Section 564(b)(1) of the Act, 21 U.S.C. section 360bbb-3(b)(1), unless the authorization is terminated or revoked.  Performed at [facility], [address] [place], [place] [address]          Radiology Studies: DG Chest 2 View  Result Date: 11/03/2021 CLINICAL DATA:  Shortness of breath since Wednesday, COPD, atrial fibrillation EXAM: CHEST - 2 VIEW COMPARISON:  08/22/2021 FINDINGS: Enlargement of cardiac silhouette with pulmonary vascular congestion. Atherosclerotic calcification aorta. Slight interstitial prominence versus previous exam consistent with mild pulmonary edema and CHF. Small bibasilar pleural effusions and minimal basilar atelectasis, new. Underlying flattening of the diaphragms suggesting COPD No pneumothorax. Bones demineralized. IMPRESSION: Mild CHF with small bibasilar pleural effusions and basilar atelectasis. Aortic Atherosclerosis (ICD10-I70.0).  Emphysema (ICD10-J43.9). Electronically Signed   By: Ulyses SouthwardMark  Boles M.D.   On: 11/03/2021 11:38        Scheduled Meds:  amLODipine  10 mg Oral Daily  apixaban  5 mg Oral BID   dorzolamide  1 drop Both Eyes BID   fluticasone furoate-vilanterol  1 puff Inhalation Daily   And   umeclidinium bromide  1 puff Inhalation Daily   furosemide  40 mg Intravenous Daily   gabapentin  100 mg Oral q morning   And   gabapentin  200 mg Oral QHS   latanoprost  1 drop Both Eyes Daily   losartan  50 mg Oral Daily   potassium chloride  10 mEq Oral BID   rosuvastatin  20 mg Oral Daily   sodium chloride flush  3 mL Intravenous Q12H   Continuous Infusions:  sodium chloride       LOS: 0 days    Time spent: over 30 min    Lacretia Nicks, MD [facility]   To contact the attending  provider between 7A-7P or the covering provider during after hours 7P-7A, please log into the web site [email or web address] and access using universal [place] password for that web site. If you do not have the password, please call the hospital operator.  11/04/2021, 3:35 PM

## 2021-11-05 LAB — MAGNESIUM: Magnesium: 2.5 mg/dL — ABNORMAL HIGH (ref 1.7–2.4)

## 2021-11-05 LAB — COMPREHENSIVE METABOLIC PANEL
ALT: 25 U/L (ref 0–44)
AST: 29 U/L (ref 15–41)
Albumin: 3.6 g/dL (ref 3.5–5.0)
Alkaline Phosphatase: 136 U/L — ABNORMAL HIGH (ref 38–126)
Anion gap: 8 (ref 5–15)
BUN: 40 mg/dL — ABNORMAL HIGH (ref 8–23)
CO2: 27 mmol/L (ref 22–32)
Calcium: 8.5 mg/dL — ABNORMAL LOW (ref 8.9–10.3)
Chloride: 101 mmol/L (ref 98–111)
Creatinine, Ser: 1.07 mg/dL — ABNORMAL HIGH (ref 0.44–1.00)
GFR, Estimated: 51 mL/min — ABNORMAL LOW (ref >60)
Glucose, Bld: 109 mg/dL — ABNORMAL HIGH (ref 70–99)
Potassium: 4.5 mmol/L (ref 3.5–5.1)
Sodium: 136 mmol/L (ref 135–145)
Total Bilirubin: 0.8 mg/dL (ref 0.3–1.2)
Total Protein: 7 g/dL (ref 6.5–8.1)

## 2021-11-05 LAB — CBC WITH DIFFERENTIAL/PLATELET
Abs Immature Granulocytes: 0.04 10*3/uL (ref 0.00–0.07)
Basophils Absolute: 0.1 10*3/uL (ref 0.0–0.1)
Basophils Relative: 1 %
Eosinophils Absolute: 0.2 10*3/uL (ref 0.0–0.5)
Eosinophils Relative: 3 %
HCT: 46.2 % — ABNORMAL HIGH (ref 36.0–46.0)
Hemoglobin: 14.8 g/dL (ref 12.0–15.0)
Immature Granulocytes: 1 %
Lymphocytes Relative: 8 %
Lymphs Abs: 0.6 10*3/uL — ABNORMAL LOW (ref 0.7–4.0)
MCH: 30.8 pg (ref 26.0–34.0)
MCHC: 32 g/dL (ref 30.0–36.0)
MCV: 96.3 fL (ref 80.0–100.0)
Monocytes Absolute: 0.8 10*3/uL (ref 0.1–1.0)
Monocytes Relative: 10 %
Neutro Abs: 6 10*3/uL (ref 1.7–7.7)
Neutrophils Relative %: 77 %
Platelets: 250 10*3/uL (ref 150–400)
RBC: 4.8 MIL/uL (ref 3.87–5.11)
RDW: 15.4 % (ref 11.5–15.5)
WBC: 7.7 10*3/uL (ref 4.0–10.5)
nRBC: 0 % (ref 0.0–0.2)

## 2021-11-05 LAB — HEMOGLOBIN A1C
Hgb A1c MFr Bld: 6.3 % — ABNORMAL HIGH (ref 4.8–5.6)
Hgb A1c MFr Bld: 6.4 % — ABNORMAL HIGH (ref 4.8–5.6)
Mean Plasma Glucose: 134 mg/dL
Mean Plasma Glucose: 137 mg/dL

## 2021-11-05 LAB — PHOSPHORUS: Phosphorus: 4 mg/dL (ref 2.5–4.6)

## 2021-11-05 NOTE — ED Notes (Signed)
Patient's O2 sat dropped to 83-88% on RA. Patient had accidentally taken off O2 cannula. Replaced O2 at 2L.

## 2021-11-05 NOTE — Evaluation (Signed)
Physical Therapy Evaluation Patient Details Name: Jenny [name] MRN: [id number] DOB: [birth year] Today's Date: 11/05/2021  History of Present Illness  Pt is an 87 yo F with hx CAD s/p PCI, HTN, bilateral carotid artery stenosis, hx atrial fib on chronic anticoagulation therapy, COPD who presented to the ED with 3 days of SOB.  She's been admitted with HF exacerbation.   Clinical Impression  Patient A&Ox4, denied pain. Stated at baseline she is independent with ADLs, modI with rollator for community ambulation, does her own errands, grocery shopping, etc. Has family available PRN. Denied any recent falls.  The patient demonstrated bed mobility modI. Sit <> stand several times with CGA/supervision, and brief donned prior to ambulation in standing with modA from PT. She was able to ambulate without RW, but exhibited improved steadiness and confidence with RW. She was able to ambulate ~66ft, and then an additional 94ft after a seated rest break. SpO2 monitored throughout; placed on room air initially, and desaturated to 87% with ambulation, RN and MD notified.  Overall the patient demonstrated deficits (see "PT Problem List") that impede the patient's functional abilities, safety, and mobility and would benefit from skilled PT intervention. Recommendation is HHPT (pt currently receiving this) to continue to maximize strength, function, and activity tolerance.        Recommendations for follow up therapy are one component of a multi-disciplinary discharge planning process, led by the attending physician.  Recommendations may be updated based on patient status, additional functional criteria and insurance authorization.  Follow Up Recommendations Home health PT    Assistance Recommended at Discharge Intermittent Supervision/Assistance  Functional Status Assessment Patient has had a recent decline in their functional status and demonstrates the ability to make significant improvements in function in a  reasonable and predictable amount of time.  Equipment Recommendations  None recommended by PT    Recommendations for Other Services       Precautions / Restrictions Precautions Precautions: Fall Restrictions Weight Bearing Restrictions: No      Mobility  Bed Mobility Overal bed mobility: Modified Independent             General bed mobility comments: HOB elevated    Transfers Overall transfer level: Needs assistance Equipment used: Rolling walker (2 wheels);None Transfers: Sit to/from Stand Sit to Stand: Min guard;From elevated surface                Ambulation/Gait Ambulation/Gait assistance: Min guard Gait Distance (Feet):  (64ft + 72ft) Assistive device: Rolling walker (2 wheels)         General Gait Details: decreased step length/height  Stairs            Wheelchair Mobility    Modified Rankin (Stroke Patients Only)       Balance Overall balance assessment: Needs assistance Sitting-balance support: Feet supported Sitting balance-Leahy Scale: Good       Standing balance-Leahy Scale: Good Standing balance comment: improvement in safety and confidence noted with RW                             Pertinent Vitals/Pain Pain Assessment: No/denies pain    Home Living Family/patient expects to be discharged to:: Private residence Living Arrangements: Alone Available Help at Discharge: Family;Available PRN/intermittently Type of Home: House Home Access: Ramped entrance       Home Layout: One level Home Equipment: Rolling Walker (2 wheels);BSC/3in1;Grab bars - tub/shower;Grab bars - toilet;Cane - single point;Wheelchair - manual  Prior Function Prior Level of Function : Independent/Modified Independent             Mobility Comments: runs errands, grocery shops, uses rollator for household mobility, no AD at home. denied any falls       Hand Dominance   Dominant Hand: Right    Extremity/Trunk Assessment    Upper Extremity Assessment Upper Extremity Assessment: Overall WFL for tasks assessed    Lower Extremity Assessment Lower Extremity Assessment: Overall WFL for tasks assessed    Cervical / Trunk Assessment Cervical / Trunk Assessment: Normal  Communication   Communication: No difficulties  Cognition Arousal/Alertness: Awake/alert Behavior During Therapy: WFL for tasks assessed/performed Overall Cognitive Status: Within Functional Limits for tasks assessed                                          General Comments      Exercises     Assessment/Plan    PT Assessment Patient needs continued PT services  PT Problem List Decreased strength;Decreased activity tolerance;Decreased balance       PT Treatment Interventions DME instruction;Therapeutic exercise;Gait training;Balance training;Neuromuscular re-education;Functional mobility training;Therapeutic activities;Patient/family education    PT Goals (Current goals can be found in the Care Plan section)  Acute Rehab PT Goals Patient Stated Goal: to go home, be stronger PT Goal Formulation: With patient Time For Goal Achievement: 11/19/21 Potential to Achieve Goals: Good    Frequency Min 2X/week   Barriers to discharge        Co-evaluation               AM-PAC PT "6 Clicks" Mobility  Outcome Measure Help needed turning from your back to your side while in a flat bed without using bedrails?: None Help needed moving from lying on your back to sitting on the side of a flat bed without using bedrails?: None Help needed moving to and from a bed to a chair (including a wheelchair)?: None Help needed standing up from a chair using your arms (e.g., wheelchair or bedside chair)?: A Little Help needed to walk in hospital room?: A Little Help needed climbing 3-5 steps with a railing? : A Lot 6 Click Score: 20    End of Session Equipment Utilized During Treatment: Gait belt Activity Tolerance: Patient  tolerated treatment well Patient left: in bed;with call bell/phone within reach Nurse Communication: Mobility status PT Visit Diagnosis: Other abnormalities of gait and mobility (R26.89);Difficulty in walking, not elsewhere classified (R26.2);Muscle weakness (generalized) (M62.81)    Time: 4650-3546 PT Time Calculation (min) (ACUTE ONLY): 32 min   Charges:   PT Evaluation $PT Eval Low Complexity: 1 Low PT Treatments $Therapeutic Exercise: 8-22 mins $Therapeutic Activity: 8-22 mins        Olga Coaster PT, DPT 2:51 PM,11/05/21

## 2021-11-05 NOTE — Consult Note (Signed)
   Heart Failure Nurse Navigator Note  HFpEF 60 to 65%.  She presented to the emergency room from home with complaints of shortness of breath for 3 days on exertion.  She also noted wheezing and chest pain.  Chest x-ray revealed small bibasilar pleural effusions.  Comorbidities:  COPD Coronary artery disease status post stent angioplasty Hypertension Atrial fibrillation  Tobacco abuse   Medications:  Eliquis 5 mg twice daily Losartan 50 mg daily Crestor 20 mg daily Potassium chloride 10 mEq twice a day Furosemide 40 mg daily  Labs:  Sodium 136, potassium 4.5, chloride 101, CO2 27, BUN 40, up from 26 of yesterday, creatinine 1.07 up from 0.86 of yesterday, hemoglobin A1c is pending.  Initial meeting with patient today, she is currently in the emergency room.  She states that she lives by herself in [place], she has a home health RN that comes in twice a week and also has been doing physical therapy.  She states that she still drives and does her own grocery shopping.  She has a son that lives in [place].  She just recently obtained a scale from her son but had not been weighing herself daily.  Discussed the importance of daily weights after going to the bathroom and reporting a 2 to 3 pound weight gain overnight or 5 pounds within the week.  In addition to the reporting of weight gain, listed symptoms of PND, orthopnea, lower extremity edema, early satiety and changes in eating status.  Patient states that she has not be able to lie flat  in bed for 3 years.  Discussed low-sodium diet.  She states prior to admission for 3 to 4 days she did eat daily rotisserie chicken from [employer]  She did not feel that the chicken had been seasoned but also discussed high sodium in mayonnaise.  She tries not to use salt at the table but states that she cannot eat eggs without salt.  Discussed her fluid intake, she states that she takes in approximately 48 ounces  of water in addition to 2-10 ounce cups of coffee daily.  Occasionally she will also have an iced tea and lemonade.  Discussed follow-up in the outpatient heart failure clinic, made aware of her appointment on December 5 at 2:30 in the afternoon.  She has a 3% rating of no-show-2 out of 65 appointments.  She had no further questions.  Will continue to follow along.  Tresa Endo RN [facility]

## 2021-11-05 NOTE — ED Notes (Signed)
Molli Hazard RN aware of assigned

## 2021-11-05 NOTE — Plan of Care (Signed)
Dr. Informed of HR maintaining 40- low 50s.  Patient states its current baseline for several months.

## 2021-11-06 MED ORDER — FUROSEMIDE 20 MG PO TABS: ORAL_TABLET | ORAL | 11 refills | Status: DC

## 2021-11-06 MED ORDER — IPRATROPIUM-ALBUTEROL 0.5-2.5 (3) MG/3ML IN SOLN: 3.0000 mL | Freq: Four times a day (QID) | RESPIRATORY_TRACT | 1 refills | Status: DC | PRN

## 2021-11-06 NOTE — Progress Notes (Signed)
Patient alert and oriented, vss, no complaints of pain. D/c telemetry and PIV.  No questions about discharge.  To be escorted out of hospital via wheelchair by volunteers.

## 2021-11-06 NOTE — Telephone Encounter (Signed)
Patient has appointment with Leda Gauze on 11/21/21.

## 2021-11-06 NOTE — TOC Transition Note (Signed)
Transition of Care [facility]) - CM/SW Discharge Note   Patient Details  Name: Jenny [name] MRN: [id number] Date of Birth: October 02, 1933  Transition of Care [facility] CM/SW Contact:  Gildardo Griffes, LCSW Phone Number: 11/06/2021, 10:37 AM   Clinical Narrative:     Patient is active with [facility], [facility] Cower with [facility] informed of patient's discharge today. Orders are in for PT and OT.   No other identified discharge needs at this time.   CSW signing off.    Final next level of care: Home w Home Health Services Barriers to Discharge: No Barriers Identified   Patient Goals and CMS Choice Patient states their goals for this hospitalization and ongoing recovery are:: to go home CMS Medicare.gov Compare Post Acute Care list provided to:: Patient Choice offered to / list presented to : Patient  Discharge Placement                       Discharge Plan and Services                          HH Arranged: PT, RN [facility] Agency: [facility] Date [facility] Agency Contacted: 11/06/21 Time HH Agency Contacted: 1037 Representative spoke with at [facility] Agency: Barbara Cower  Social Determinants of Health (SDOH) Interventions     Readmission Risk Interventions No flowsheet data found.

## 2021-11-06 NOTE — Progress Notes (Addendum)
[facility] NAME: Jenny [name]    MR#:  [id number]  DATE OF BIRTH:  05/20/1933  SUBJECTIVE:   patient came in with increasing shortness of breath and leg swelling. She was found to be and CHF. Received IV Lasix. Good urine output currently on 2 L nasal cannula oxygen. REVIEW OF SYSTEMS:   Review of Systems  Constitutional:  Negative for chills, fever and weight loss.  HENT:  Negative for ear discharge, ear pain and nosebleeds.   Eyes:  Negative for blurred vision, pain and discharge.  Respiratory:  Positive for shortness of breath. Negative for sputum production, wheezing and stridor.   Cardiovascular:  Positive for leg swelling. Negative for chest pain, palpitations, orthopnea and PND.  Gastrointestinal:  Negative for abdominal pain, diarrhea, nausea and vomiting.  Genitourinary:  Negative for frequency and urgency.  Musculoskeletal:  Negative for back pain and joint pain.  Neurological:  Positive for weakness. Negative for sensory change, speech change and focal weakness.  Psychiatric/Behavioral:  Negative for depression and hallucinations. The patient is not nervous/anxious.   Tolerating Diet:yes Tolerating PT: HHPT  DRUG ALLERGIES:   Allergies  Allergen Reactions   Cyclobenzaprine     Other reaction(s): muscle relaxers-ulcer hemorrhage (hospitalized)   Lactose Intolerance (Gi)    Propoxyphene     Other reaction(s): pain meds, hallucination, vomiting   Atorvastatin Itching    Itching and myaliga   Meperidine Hcl Other (See Comments)    Not known   Pravastatin Rash    rash    VITALS:  Blood pressure (!) 146/63, pulse (!) 53, temperature (!) 97.5 F (36.4 C), resp. rate 17, height 5\' 2"  (1.575 m), weight 86 kg, SpO2 95 %.  PHYSICAL EXAMINATION:   Physical Exam  GENERAL:  87 y.o.-year-old patient lying in the bed with no acute distress.  HEENT: Head atraumatic, normocephalic. Oropharynx and nasopharynx clear.   LUNGS: Normal breath sounds bilaterally, no wheezing, rales, rhonchi. No use of accessory muscles of respiration.  CARDIOVASCULAR: S1, S2 normal. No murmurs, rubs, or gallops.  ABDOMEN: Soft, nontender, nondistended. Bowel sounds present. No organomegaly or mass.  EXTREMITIES: No cyanosis, clubbing or edema b/l.    NEUROLOGIC: nonfocal PSYCHIATRIC:  patient is alert and oriented x 3.  SKIN: No obvious rash, lesion, or ulcer.   LABORATORY PANEL:  CBC Recent Labs  Lab 11/05/21 0623  WBC 7.7  HGB 14.8  HCT 46.2*  PLT 250    Chemistries  Recent Labs  Lab 11/05/21 0623  NA 136  K 4.5  CL 101  CO2 27  GLUCOSE 109*  BUN 40*  CREATININE 1.07*  CALCIUM 8.5*  MG 2.5*  AST 29  ALT 25  ALKPHOS 136*  BILITOT 0.8   Cardiac Enzymes No results for input(s): TROPONINI in the last 168 hours. RADIOLOGY:  No results found. ASSESSMENT AND PLAN:  87 yo F with hx CAD s/p PCI, HTN, bilateral carotid artery stenosis, hx atrial fib on chronic anticoagulation therapy, COPD who presented to the ED with 3 days of SOB.  She's been admitted with CHF exacerbation.   Acute on chronic diastolic heart failure Acute hypoxic respiratory failure--sats in 80's  --Currently on 3 L [place]--now on RA sats 93--95% --CXR with mild CHF with small bibasilar effusions and basilar atelectasis --Echo with 60-65%, mild LVH, severely elevated PASP --Continue IV lasix 40 mg --good uop Strict I/O, daily weights Sats today 93% on 2 liters-- wean to  room air  as possible    Hx Atrial Fibrillation Eliquis   COPD--chronic Breo ellipta, incruse ellipta  Nebulizer for home  T2DM Diet controlled   HLD Crestor   HTN Losartan       DVT prophylaxis: eliquis Code Status: (full  Family Communication: none at bedside Disposition: home in 1-2 days  Level of care: Telemetry Cardiac Status is: Inpatient  Remains inpatient appropriate because: CHF exacerbation        TOTAL TIME TAKING CARE OF THIS  PATIENT: 35 minutes.  >50% time spent on counselling and coordination of care  Note: This dictation was prepared with Dragon dictation along with smaller phrase technology. Any transcriptional errors that result from this process are unintentional.  Enedina Finner M.D    [facility]   CC: Primary care physician; Kirby Funk, MD

## 2021-11-06 NOTE — Discharge Summary (Signed)
[facility] NAME: Jenny [name]    MR#:  [id number]  DATE OF BIRTH:  Sep 27, 1933  DATE OF ADMISSION:  11/03/2021 ADMITTING PHYSICIAN: A Melven Sartorius., MD  DATE OF DISCHARGE: 11/06/2021  PRIMARY CARE PHYSICIAN: Lavone Orn, MD    ADMISSION DIAGNOSIS:  COPD exacerbation ([place]) [J44.1] Acute respiratory failure with hypoxia ([place]) [J96.01] Heart failure ([place]) [I50.9] Acute on chronic diastolic CHF (congestive heart failure) (H[facility]) [I50.33] Acute on chronic congestive heart failure, unspecified heart failure type ([place]) [I50.9]  DISCHARGE DIAGNOSIS:  Acute on chronic CHF, diastolic  SECONDARY DIAGNOSIS:   Past Medical History:  Diagnosis Date   ACUT DUOD ULCER W/HEMORR&PERF W/O MENTION OBST 10/05/2009   NSAID induced   ALLERGIC RHINITIS CAUSE UNSPECIFIED    ANEMIA-NOS    CAD (coronary artery disease) 06/08/2009   DEs RCA with 70% LAD and EF 60%   COPD    mild obst on PFTs 03/2010   Diabetes mellitus 06/2010 dx   Mild, diet controlled   GLAUCOMA    HYPERLIPIDEMIA    HYPERTENSION, BENIGN    MYOCARDIAL INFARCTION 06/08/2009   des to rca   Persistent atrial fibrillation ([place])    Dx 08/2021   TOBACCO ABUSE     HOSPITAL COURSE:  87 yo F with hx CAD s/p PCI, HTN, bilateral carotid artery stenosis, hx atrial fib on chronic anticoagulation therapy, COPD who presented to the ED with 3 days of SOB.  She's been admitted with CHF exacerbation.  Acute on chronic diastolic heart failure Acute hypoxic respiratory failure--sats in 80's  Currently on 3 L [place]--now on RA sats 93--95% CXR with mild CHF with small bibasilar effusions and basilar atelectasis Echo with 60-65%, mild LVH, severely elevated PASP Continue lasix 40 mg --good uop, patient seems to be breathing well. Will change her to Lasix 40 mg for one more day and then 20 mg daily. She is recommended take 20 additional as needed on days she feels excessive short of breath with  leg edema and weight more than 5 pounds. Should follow-up with cardiology as outpatient. Strict I/O, daily weights patient worked with physical therapy transiently dropped an 88%. She recovered very quickly. She feels okay and does not want oxygen. She said she will follow-up with her primary care physician. Sats today 95% on RA    Hx Atrial Fibrillation Eliquis   COPD--chronic Breo ellipta, incruse ellipta   T2DM Cont home meds   HLD Crestor   HTN Losartan      DVT prophylaxis: eliquis Code Status: (full  Family Communication: none at bedside Disposition: home today   D/c home. Pt agreeable   CONSULTS OBTAINED:    DRUG ALLERGIES:   Allergies  Allergen Reactions   Cyclobenzaprine     Other reaction(s): muscle relaxers-ulcer hemorrhage (hospitalized)   Lactose Intolerance (Gi)    Propoxyphene     Other reaction(s): pain meds, hallucination, vomiting   Atorvastatin Itching    Itching and myaliga   Meperidine Hcl Other (See Comments)    Not known   Pravastatin Rash    rash    DISCHARGE MEDICATIONS:   Allergies as of 11/06/2021       Reactions   Cyclobenzaprine    Other reaction(s): muscle relaxers-ulcer hemorrhage (hospitalized)   Lactose Intolerance (gi)    Propoxyphene    Other reaction(s): pain meds, hallucination, vomiting   Atorvastatin Itching   Itching and myaliga   Meperidine Hcl Other (See Comments)  Not known   Pravastatin Rash   rash        Medication List     TAKE these medications    albuterol 108 (90 Base) MCG/ACT inhaler Commonly known as: Ventolin HFA INHALE ONE PUFF EVERY 6 HOURS AS NEEDED FOR SHORTNESS OF BREATH   amLODipine 10 MG tablet Commonly known as: NORVASC Take 1 tablet (10 mg total) by mouth daily.   apixaban 5 MG Tabs tablet Commonly known as: ELIQUIS Take 1 tablet (5 mg total) by mouth 2 (two) times daily.   dorzolamide 2 % ophthalmic solution Commonly known as: TRUSOPT Place 1 drop into both eyes 2  (two) times daily.   famotidine 20 MG tablet Commonly known as: PEPCID Take 20 mg by mouth 2 (two) times daily as needed for heartburn.   furosemide 20 MG tablet Commonly known as: Lasix Take 40 mg on 11/30 and then from 11/08/21 take 20 mg daily. Take additional 20 mg as needed on days when you feel sob or have increased leg edema What changed:  how much to take how to take this when to take this reasons to take this additional instructions   gabapentin 100 MG capsule Commonly known as: NEURONTIN Take 100-200 mg by mouth See admin instructions. Take 1 capsule (100mg ) by mouth every morning and take 2 capsules (200mg ) by mouth at bedtime   ipratropium-albuterol 0.5-2.5 (3) MG/3ML Soln Commonly known as: DUONEB Take 3 mLs by nebulization every 6 (six) hours as needed.   latanoprost 0.005 % ophthalmic solution Commonly known as: XALATAN Place 1 drop into both eyes in the morning.   losartan 50 MG tablet Commonly known as: COZAAR Take 1 tablet (50 mg total) by mouth in the morning and at bedtime.   magnesium hydroxide 400 MG/5ML suspension Commonly known as: MILK OF MAGNESIA Take 30 mLs by mouth as needed for mild constipation.   Nitrostat 0.4 MG SL tablet Generic drug: nitroGLYCERIN DISSOLVE ONE TABLET UNDER TONGUE AS NEEDED FOR CHEST PAIN - MAY REPEAT TWICE-IF NO RELIEF GO TO NEAREST HOSPITAL ER   potassium chloride 10 MEQ tablet Commonly known as: KLOR-CON Take 10 mEq by mouth daily as needed (supplementation with furosemide).   rosuvastatin 20 MG tablet Commonly known as: CRESTOR Take 20 mg by mouth daily.   Trelegy Ellipta 100-62.5-25 MCG/ACT Aepb Generic drug: Fluticasone-Umeclidin-Vilant Inhale 1 puff into the lungs daily.               Durable Medical Equipment  (From admission, onward)           Start     Ordered   11/06/21 0945  For home use only DME Nebulizer/meds  Once       Question Answer Comment  Patient needs a nebulizer to treat with  the following condition COPD (chronic obstructive pulmonary disease) (HCC)   Length of Need Lifetime      11/06/21 0945            If you experience worsening of your admission symptoms, develop shortness of breath, life threatening emergency, suicidal or homicidal thoughts you must seek medical attention immediately by calling 911 or calling your MD immediately  if symptoms less severe.  You Must read complete instructions/literature along with all the possible adverse reactions/side effects for all the Medicines you take and that have been prescribed to you. Take any new Medicines after you have completely understood and accept all the possible adverse reactions/side effects.   Please note  You were cared  for by a hospitalist during your hospital stay. If you have any questions about your discharge medications or the care you received while you were in the hospital after you are discharged, you can call the unit and asked to speak with the hospitalist on call if the hospitalist that took care of you is not available. Once you are discharged, your primary care physician will handle any further medical issues. Please note that NO REFILLS for any discharge medications will be authorized once you are discharged, as it is imperative that you return to your primary care physician (or establish a relationship with a primary care physician if you do not have one) for your aftercare needs so that they can reassess your need for medications and monitor your lab values. Today   SUBJECTIVE    I peed a lot and feel better today VITAL SIGNS:  Blood pressure (!) 146/63, pulse (!) 53, temperature (!) 97.5 F (36.4 C), resp. rate 17, height 5\' 2"  (1.575 m), weight 86 kg, SpO2 95 %.  I/O:   Intake/Output Summary (Last 24 hours) at 11/06/2021 1019 Last data filed at 11/06/2021 0951 Gross per 24 hour  Intake 720 ml  Output 2350 ml  Net -1630 ml    PHYSICAL EXAMINATION:  GENERAL:  87  y.o.-year-old patient lying in the bed with no acute distress.  LUNGS: decreased breath sounds bilaterally, no wheezing, rales,rhonchi or crepitation. No use of accessory muscles of respiration.  CARDIOVASCULAR: S1, S2 normal. No murmurs, rubs, or gallops.  ABDOMEN: Soft, non-tender, non-distended. Bowel sounds present. No organomegaly or mass.  EXTREMITIES: + pedal edema, no cyanosis, or clubbing.  NEUROLOGIC: non-focal PSYCHIATRIC:  patient is alert and oriented x 3.  SKIN: No obvious rash, lesion, or ulcer.   DATA REVIEW:   CBC  Recent Labs  Lab 11/05/21 0623  WBC 7.7  HGB 14.8  HCT 46.2*  PLT 250    Chemistries  Recent Labs  Lab 11/05/21 0623  NA 136  K 4.5  CL 101  CO2 27  GLUCOSE 109*  BUN 40*  CREATININE 1.07*  CALCIUM 8.5*  MG 2.5*  AST 29  ALT 25  ALKPHOS 136*  BILITOT 0.8    Microbiology Results   Recent Results (from the past 240 hour(s))  Resp Panel by RT-PCR (Flu A&B, Covid) Nasopharyngeal Swab     Status: None   Collection Time: 11/03/21  3:45 PM   Specimen: Nasopharyngeal Swab; Nasopharyngeal(NP) swabs in vial transport medium  Result Value Ref Range Status   SARS Coronavirus 2 by RT PCR NEGATIVE NEGATIVE Final    Comment: (NOTE) SARS-CoV-2 target nucleic acids are NOT DETECTED.  The SARS-CoV-2 RNA is generally detectable in upper respiratory specimens during the acute phase of infection. The lowest concentration of SARS-CoV-2 viral copies this assay can detect is 138 copies/mL. A negative result does not preclude SARS-Cov-2 infection and should not be used as the sole basis for treatment or other patient management decisions. A negative result may occur with  improper specimen collection/handling, submission of specimen other than nasopharyngeal swab, presence of viral mutation(s) within the areas targeted by this assay, and inadequate number of viral copies(<138 copies/mL). A negative result must be combined with clinical observations,  patient history, and epidemiological information. The expected result is Negative.  Fact Sheet for Patients:  BloggerCourse.comhttps://www.fda.gov/media/152166/download  Fact Sheet for Healthcare Providers:  SeriousBroker.ithttps://www.fda.gov/media/152162/download  This test is no t yet approved or cleared by the [place] FDA and  has been authorized for  detection and/or diagnosis of SARS-CoV-2 by FDA under an Emergency Use Authorization (EUA). This EUA will remain  in effect (meaning this test can be used) for the duration of the COVID-19 declaration under Section 564(b)(1) of the Act, 21 U.S.C.section 360bbb-3(b)(1), unless the authorization is terminated  or revoked sooner.       Influenza A by PCR NEGATIVE NEGATIVE Final   Influenza B by PCR NEGATIVE NEGATIVE Final    Comment: (NOTE) The Xpert Xpress SARS-CoV-2/FLU/RSV plus assay is intended as an aid in the diagnosis of influenza from Nasopharyngeal swab specimens and should not be used as a sole basis for treatment. Nasal washings and aspirates are unacceptable for Xpert Xpress SARS-CoV-2/FLU/RSV testing.  Fact Sheet for Patients: EntrepreneurPulse.com.au  Fact Sheet for Healthcare Providers: IncredibleEmployment.be  This test is not yet approved or cleared by the [place] FDA and has been authorized for detection and/or diagnosis of SARS-CoV-2 by FDA under an Emergency Use Authorization (EUA). This EUA will remain in effect (meaning this test can be used) for the duration of the COVID-19 declaration under Section 564(b)(1) of the Act, 21 U.S.C. section 360bbb-3(b)(1), unless the authorization is terminated or revoked.  Performed at [facility], [address] [place], [place] [address]     RADIOLOGY:  No results found.   CODE STATUS:     Code Status Orders  (From admission, onward)           Start     Ordered   11/03/21 1718  Full code  Continuous        11/03/21 1718            Code Status History     Date Active Date Inactive Code Status Order ID Comments User Context   08/22/2021 1512 08/27/2021 2243 Full Code [id number]  Kayleen Memos, DO ED   08/22/2021 1429 08/22/2021 1511 Full Code [id number]  Kayleen Memos, DO ED      Advance Directive Documentation    Flowsheet Row Most Recent Value  Type of Advance Directive Living will, Healthcare Power of Attorney  Pre-existing out of facility DNR order (yellow form or pink MOST form) --  "MOST" Form in Place? --        TOTAL TIME TAKING CARE OF THIS PATIENT: 40 minutes.    Fritzi Mandes M.D  [facility]    CC: Primary care physician; Lavone Orn, MD

## 2021-11-07 DIAGNOSIS — I110 Hypertensive heart disease with heart failure: Secondary | ICD-10-CM | POA: Diagnosis not present

## 2021-11-07 DIAGNOSIS — I2510 Atherosclerotic heart disease of native coronary artery without angina pectoris: Secondary | ICD-10-CM | POA: Diagnosis not present

## 2021-11-07 DIAGNOSIS — J449 Chronic obstructive pulmonary disease, unspecified: Secondary | ICD-10-CM | POA: Diagnosis not present

## 2021-11-07 DIAGNOSIS — I4819 Other persistent atrial fibrillation: Secondary | ICD-10-CM | POA: Diagnosis not present

## 2021-11-07 DIAGNOSIS — I5033 Acute on chronic diastolic (congestive) heart failure: Secondary | ICD-10-CM | POA: Diagnosis not present

## 2021-11-07 DIAGNOSIS — E1151 Type 2 diabetes mellitus with diabetic peripheral angiopathy without gangrene: Secondary | ICD-10-CM | POA: Diagnosis not present

## 2021-11-07 NOTE — Telephone Encounter (Signed)
Pt is scheduled to see Dr. Clifton James on 11/09/21

## 2021-11-09 ENCOUNTER — Other Ambulatory Visit: Payer: Self-pay

## 2021-11-09 ENCOUNTER — Encounter: Payer: Self-pay | Admitting: Cardiovascular Disease

## 2021-11-09 ENCOUNTER — Ambulatory Visit (INDEPENDENT_AMBULATORY_CARE_PROVIDER_SITE_OTHER): Payer: Medicare Other | Admitting: Cardiovascular Disease

## 2021-11-09 VITALS — BP 136/60 | HR 60 | Ht 62.0 in | Wt 191.6 lb

## 2021-11-09 DIAGNOSIS — E7800 Pure hypercholesterolemia, unspecified: Secondary | ICD-10-CM | POA: Diagnosis not present

## 2021-11-09 DIAGNOSIS — I4819 Other persistent atrial fibrillation: Secondary | ICD-10-CM

## 2021-11-09 DIAGNOSIS — I10 Essential (primary) hypertension: Secondary | ICD-10-CM | POA: Diagnosis not present

## 2021-11-09 DIAGNOSIS — Z720 Tobacco use: Secondary | ICD-10-CM | POA: Diagnosis not present

## 2021-11-09 DIAGNOSIS — I6523 Occlusion and stenosis of bilateral carotid arteries: Secondary | ICD-10-CM

## 2021-11-09 DIAGNOSIS — I2510 Atherosclerotic heart disease of native coronary artery without angina pectoris: Secondary | ICD-10-CM

## 2021-11-09 NOTE — Patient Instructions (Signed)
Medication Instructions:  No changes today *If you need a refill on your cardiac medications before your next appointment, please call your pharmacy*  Lab Work: none If you have labs (blood work) drawn today and your tests are completely normal, you will receive your results only by: . MyChart Message (if you have MyChart) OR . A paper copy in the mail If you have any lab test that is abnormal or we need to change your treatment, we will call you to review the results.  Testing/Procedures: none  Follow-Up: As planned.    Other Instructions    

## 2021-11-09 NOTE — Progress Notes (Signed)
Chief Complaint  Patient presents with   Follow-up    Atrial fib   History of Present Illness: 87 yo female with history of CAD, HTN, HLD, COPD, tobacco abuse, carotid artery disease, [place] 1 heart block, chronic diastolic CHF, atrial fibrillation and DM here today cardiac follow up. In July of 2010 she had an inferior MI treated with a drug eluting stent in the right coronary artery. She had residual 70% LAD stenosis and an ejection fraction of 60%. She also has chronic shortness of breath and moderate chronic obstructive pulmonary disease by pulmonary function testing. She has continued to smoke despite advice against this.  Stress myoview 12/20/13 with no ischemia, normal LV function. Mild bilateral carotid artery disease by dopplers in January 2020. She was hospitalized in September 2022 with COPD exacerbation  and acute on chronic diastolic CHF. Echo September 2022 with LVEF=60-65%. She was found to be in atrial fibrillation. She was diuresed with IV Lasix. She is now on Eliquis. She was found to be bradycardic and toprol was stopped. She had been on ASA and Plavix but both have been stopped. Cardiac monitor October 2022 with 100% atrial fibrillation. She has been seen in the atrial fib clinic. She was admitted to [facility] 11/03/21 with a CHF and COPD exacerbation.   She is here today for follow up. The patient denies any chest pain, dyspnea, palpitations, lower extremity edema, orthopnea, PND, dizziness, near syncope or syncope. Feeling better since discharge. Taking Lasix 20 mg per day. Weight is stable. She is not happy with our office or the healthcare system. She wanted to let me know how angry she was during the first ten minutes of our visit.    Primary Care Physician: Lavone Orn, MD  Past Medical History:  Diagnosis Date   ACUT DUOD ULCER W/HEMORR&PERF W/O MENTION OBST 10/05/2009   NSAID induced   ALLERGIC RHINITIS CAUSE UNSPECIFIED    ANEMIA-NOS    CAD (coronary artery disease)  06/08/2009   DEs RCA with 70% LAD and EF 60%   COPD    mild obst on PFTs 03/2010   Diabetes mellitus 06/2010 dx   Mild, diet controlled   GLAUCOMA    HYPERLIPIDEMIA    HYPERTENSION, BENIGN    MYOCARDIAL INFARCTION 06/08/2009   des to rca   Persistent atrial fibrillation ([place])    Dx 08/2021   TOBACCO ABUSE     Past Surgical History:  Procedure Laterality Date   HEMORRHOID SURGERY  1990   Right knee surgery      Current Outpatient Medications  Medication Sig Dispense Refill   albuterol (VENTOLIN HFA) 108 (90 Base) MCG/ACT inhaler INHALE ONE PUFF EVERY 6 HOURS AS NEEDED FOR SHORTNESS OF BREATH 18 g 2   amLODipine (NORVASC) 10 MG tablet Take 1 tablet (10 mg total) by mouth daily. 90 tablet 3   apixaban (ELIQUIS) 5 MG TABS tablet Take 1 tablet (5 mg total) by mouth 2 (two) times daily. 60 tablet 11   dorzolamide (TRUSOPT) 2 % ophthalmic solution Place 1 drop into both eyes 2 (two) times daily.     famotidine (PEPCID) 20 MG tablet Take 20 mg by mouth 2 (two) times daily as needed for heartburn.     Fluticasone-Umeclidin-Vilant (TRELEGY ELLIPTA) 100-62.5-25 MCG/INH AEPB Inhale 1 puff into the lungs daily. 1 each 1   furosemide (LASIX) 20 MG tablet Take 40 mg on 11/30 and then from 11/08/21 take 20 mg daily. Take additional 20 mg as needed on days when  you feel sob or have increased leg edema 30 tablet 11   gabapentin (NEURONTIN) 100 MG capsule Take 100-200 mg by mouth See admin instructions. Take 1 capsule (100mg ) by mouth every morning and take 2 capsules (200mg ) by mouth at bedtime     ipratropium-albuterol (DUONEB) 0.5-2.5 (3) MG/3ML SOLN Take 3 mLs by nebulization every 6 (six) hours as needed. 360 mL 1   latanoprost (XALATAN) 0.005 % ophthalmic solution Place 1 drop into both eyes in the morning.     losartan (COZAAR) 50 MG tablet Take 1 tablet (50 mg total) by mouth in the morning and at bedtime.     magnesium hydroxide (MILK OF MAGNESIA) 400 MG/5ML suspension Take 30 mLs by mouth as  needed for mild constipation.     NITROSTAT 0.4 MG SL tablet DISSOLVE ONE TABLET UNDER TONGUE AS NEEDED FOR CHEST PAIN - MAY REPEAT TWICE-IF NO RELIEF GO TO NEAREST HOSPITAL ER 25 tablet 0   potassium chloride (KLOR-CON) 10 MEQ tablet Take 10 mEq by mouth daily as needed (supplementation with furosemide).     rosuvastatin (CRESTOR) 20 MG tablet Take 20 mg by mouth daily.     No current facility-administered medications for this visit.    Allergies  Allergen Reactions   Cyclobenzaprine     Other reaction(s): muscle relaxers-ulcer hemorrhage (hospitalized)   Lactose Intolerance (Gi)    Propoxyphene     Other reaction(s): pain meds, hallucination, vomiting   Atorvastatin Itching    Itching and myaliga   Meperidine Hcl Other (See Comments)    Not known   Pravastatin Rash    rash    Social History   Socioeconomic History   Marital status: Widowed    Spouse name: Not on file   Number of children: Not on file   Years of education: Not on file   Highest education level: Not on file  Occupational History   Not on file  Tobacco Use   Smoking status: Every Day    Packs/day: 0.50    Years: 60.00    Pack years: 30.00    Types: Cigarettes   Smokeless tobacco: Never   Tobacco comments:    She lives in [place] with her significant other (Charles Hook)0  Vaping Use   Vaping Use: Never used  Substance and Sexual Activity   Alcohol use: Yes    Comment: Occassional   Drug use: No   Sexual activity: Not on file  Other Topics Concern   Not on file  Social History Narrative   Lives in [place] with her SO - charles hook   Retired -Catering manager   Enjoys travel - live Photographer   Social Determinants of Radio broadcast assistant Strain: Not on Art therapist Insecurity: Not on file  Transportation Needs: Not on file  Physical Activity: Not on file  Stress: Not on file  Social Connections: Not on file  Intimate Partner Violence: Not on file    Family History   Problem Relation Age of Onset   Hypertension Mother    Ulcers Mother    Stomach cancer Father    Stroke Maternal Grandmother    Heart attack Neg Hx     Review of Systems:  As stated in the HPI and otherwise negative.   BP 136/60   Pulse 60   Ht 5\' 2"  (1.575 m)   Wt 191 lb 9.6 oz (86.9 kg)   SpO2 98%   BMI 35.04 kg/m   Physical Examination:  General: Well developed, well nourished, NAD  HEENT: OP clear, mucus membranes moist  SKIN: warm, dry. No rashes. Neuro: No focal deficits  Musculoskeletal: Muscle strength 5/5 all ext  Psychiatric: Mood and affect normal  Neck: No JVD, no carotid bruits, no thyromegaly, no lymphadenopathy.  Lungs:Clear bilaterally, no wheezes, rhonci, crackles Cardiovascular: Irreg irreg. No murmurs, gallops or rubs. Abdomen:Soft. Bowel sounds present. Non-tender.  Extremities: No lower extremity edema. Pulses are 2 + in the bilateral DP/PT.  EKG:  EKG is ordered today The ekg ordered today demonstrates  Atrial fib, slow ventricular response  Recent Labs: 11/03/2021: B Natriuretic Peptide 176.5 11/05/2021: ALT 25; BUN 40; Creatinine, Ser 1.07; Hemoglobin 14.8; Magnesium 2.5; Platelets 250; Potassium 4.5; Sodium 136    Wt Readings from Last 3 Encounters:  11/09/21 191 lb 9.6 oz (86.9 kg)  11/06/21 189 lb 9.5 oz (86 kg)  10/23/21 196 lb (88.9 kg)     Other studies Reviewed: Additional studies/ records that were reviewed today include: . Review of the above records demonstrates:   Assessment and Plan:   1. CAD without angina: She has been known to have a moderate LAD stenosis since her cath in 2010 and has continued smoking since then. No chest pain. Continue ASA, Plavix, statin and beta blocker.     2. HYPERTENSION: BP is controlled. No changes.   3. HYPERLIPIDEMIA: Lipids followed in primary care. LDL 43. Continue statin.   4. Carotid artery disease: Mild bilateral carotid disease by dopplers January 2020.    5. Tobacco abuse: Smoking  cessation is recommended.   6. Atrial fibrillation/History of Second degree AV block, type 1/First degree AV block: No dizziness. No indication for a pacemaker at this time. Her heart rate is in the 50-60s today. She is on Eliquis. Followed in atrial fib office. May consider DCCV after the holidays.    Current medicines are reviewed at length with the patient today.  The patient does not have concerns regarding medicines.  The following changes have been made:  no change  Labs/ tests ordered today include:   No orders of the defined types were placed in this encounter.  Disposition:   FU with me in 12  months  Signed, Verne Carrow, MD 11/09/2021 4:57 PM    [facility] [address] [place], [place], [place]  [address] Phone: [phone]; Fax: [phone]

## 2021-11-10 NOTE — Progress Notes (Signed)
Patient ID: Jenny [name], female    DOB: 04/08/1933, 87 y.o.   MRN: [id number]  HPI  Jenny [name] is a 87 y/o female with a history of CAD, hyperlipidemia, HTN, anemia, COPD, glaucoma, atrial fibrillation, recent tobacco use and chronic heart failure.   Echo report from 08/22/21 reviewed and showed an EF of 60-65% along with mild LVH/ LAE, mild MR and severely elevated PA pressure of 65.1 mmHg.   Admitted 11/03/21 due to acute on chronic heart failure. Needed oxygen at 3L but then able to be weaned down to room air. Initially given IV lasix with transition to oral diuretics. PT evaluation done. Discharged after 3 days.   She presents today for her initial visit with a chief complaint of minimal fatigue upon moderate exertion. She describes this as chronic in nature having been present for several years. She has associated palpitations, easy bruising and difficulty sleeping along with this. She denies any dizziness, abdominal distention, pedal edema, chest pain, shortness of breath, cough or weight gain.   When asked about diabetes, she is adamant that she is not diabetic and has no intention of pricking her fingertips to check it.   Has been taking furosemide for a few  years and doesn't feel like it works on her like it used to. Says that even when she doubles it, she doesn't urinate anymore than she normally does.   Tries to monitor her sodium intake and doesn't add any extra salt to her food. Did eat lunch at [employer] prior to coming to the appointment today.   Past Medical History:  Diagnosis Date   ACUT DUOD ULCER W/HEMORR&PERF W/O MENTION OBST 10/05/2009   NSAID induced   ALLERGIC RHINITIS CAUSE UNSPECIFIED    ANEMIA-NOS    CAD (coronary artery disease) 06/08/2009   DEs RCA with 70% LAD and EF 60%   CHF (congestive heart failure) (HCC)    COPD    mild obst on PFTs 03/2010   Diabetes mellitus 06/2010 dx   Mild, diet controlled   GLAUCOMA    HYPERLIPIDEMIA    HYPERTENSION, BENIGN     MYOCARDIAL INFARCTION 06/08/2009   des to rca   Persistent atrial fibrillation (HCC)    Dx 08/2021   TOBACCO ABUSE    Past Surgical History:  Procedure Laterality Date   HEMORRHOID SURGERY  1990   Right knee surgery     Family History  Problem Relation Age of Onset   Hypertension Mother    Ulcers Mother    Stomach cancer Father    Stroke Maternal Grandmother    Heart attack Neg Hx    Social History   Tobacco Use   Smoking status: Former    Packs/day: 0.50    Years: 60.00    Pack years: 30.00    Types: Cigarettes    Quit date: 11/12/2021   Smokeless tobacco: Never   Tobacco comments:    She lives in [place] with her significant other (Jenny [name])0  Substance Use Topics   Alcohol use: Yes    Comment: Occassional   Allergies  Allergen Reactions   Cyclobenzaprine     Other reaction(s): muscle relaxers-ulcer hemorrhage (hospitalized)   Lactose Intolerance (Gi)    Propoxyphene     Other reaction(s): pain meds, hallucination, vomiting   Atorvastatin Itching    Itching and myaliga   Meperidine Hcl Other (See Comments)    Not known   Pravastatin Rash    rash   Prior to Admission  medications   Medication Sig Start Date End Date Taking? Authorizing Provider  albuterol (VENTOLIN HFA) 108 (90 Base) MCG/ACT inhaler INHALE ONE PUFF EVERY 6 HOURS AS NEEDED FOR SHORTNESS OF BREATH 11/03/21  Yes Lucrezia Starch, MD  amLODipine (NORVASC) 10 MG tablet Take 1 tablet (10 mg total) by mouth daily. 10/17/21  Yes Burnell Blanks, MD  apixaban (ELIQUIS) 5 MG TABS tablet Take 1 tablet (5 mg total) by mouth 2 (two) times daily. 10/11/21  Yes Turner, Eber Hong, MD  dorzolamide (TRUSOPT) 2 % ophthalmic solution Place 1 drop into both eyes 2 (two) times daily.   Yes [provider]  famotidine (PEPCID) 20 MG tablet Take 20 mg by mouth 2 (two) times daily as needed for heartburn.   Yes [provider]  Fluticasone-Umeclidin-Vilant (TRELEGY ELLIPTA) 100-62.5-25 MCG/INH  AEPB Inhale 1 puff into the lungs daily. 08/27/21  Yes Mercy Riding, MD  furosemide (LASIX) 20 MG tablet Take 40 mg on 11/30 and then from 11/08/21 take 20 mg daily. Take additional 20 mg as needed on days when you feel sob or have increased leg edema 11/06/21  Yes Fritzi Mandes, MD  gabapentin (NEURONTIN) 100 MG capsule Take 100-200 mg by mouth See admin instructions. Take 1 capsule (100mg ) by mouth every morning and take 2 capsules (200mg ) by mouth at bedtime   Yes [provider]  ipratropium-albuterol (DUONEB) 0.5-2.5 (3) MG/3ML SOLN Take 3 mLs by nebulization every 6 (six) hours as needed. 11/06/21  Yes Fritzi Mandes, MD  latanoprost (XALATAN) 0.005 % ophthalmic solution Place 1 drop into both eyes in the morning.   Yes [provider]  losartan (COZAAR) 50 MG tablet Take 1 tablet (50 mg total) by mouth in the morning and at bedtime. 10/23/21  Yes [provider]  magnesium hydroxide (MILK OF MAGNESIA) 400 MG/5ML suspension Take 30 mLs by mouth as needed for mild constipation.   Yes [provider]  NITROSTAT 0.4 MG SL tablet DISSOLVE ONE TABLET UNDER TONGUE AS NEEDED FOR CHEST PAIN - MAY REPEAT TWICE-IF NO RELIEF GO TO NEAREST HOSPITAL ER 05/06/13  Yes Rowe Clack, MD  potassium chloride (KLOR-CON) 10 MEQ tablet Take 10 mEq by mouth daily as needed (supplementation with furosemide).   Yes [provider]  rosuvastatin (CRESTOR) 20 MG tablet Take 20 mg by mouth daily.   Yes [provider]   Review of Systems  Constitutional:  Positive for fatigue. Negative for appetite change.  HENT:  Negative for congestion, postnasal drip and sore throat.   Eyes: Negative.   Respiratory:  Negative for cough, chest tightness and shortness of breath.   Cardiovascular:  Positive for palpitations (sometimes). Negative for chest pain and leg swelling.  Gastrointestinal:  Negative for abdominal distention and abdominal pain.  Endocrine: Negative.    Genitourinary: Negative.   Musculoskeletal:  Negative for back pain and neck pain.  Skin: Negative.   Allergic/Immunologic: Negative.   Neurological:  Negative for dizziness and light-headedness.  Hematological:  Negative for adenopathy. Bruises/bleeds easily.  Psychiatric/Behavioral:  Positive for sleep disturbance (chronic issue; sleeping sitting on sofa). Negative for dysphoric mood. The patient is not nervous/anxious.    Vitals:   11/12/21 1421  BP: (!) 133/56  Pulse: (!) 58  Resp: 18  SpO2: 97%  Weight: 197 lb 2 oz (89.4 kg)  Height: 5\' 2"  (1.575 m)   Wt Readings from Last 3 Encounters:  11/12/21 197 lb 2 oz (89.4 kg)  11/09/21 191 lb 9.6  oz (86.9 kg)  11/06/21 189 lb 9.5 oz (86 kg)   Lab Results  Component Value Date   CREATININE 1.07 (H) 11/05/2021   CREATININE 0.86 11/04/2021   CREATININE 0.75 11/03/2021   Physical Exam Vitals and nursing note reviewed. Exam conducted with a chaperone present (son).  Constitutional:      Appearance: Normal appearance.  HENT:     Head: Normocephalic.  Cardiovascular:     Rate and Rhythm: Bradycardia present. Rhythm irregular.  Pulmonary:     Effort: Pulmonary effort is normal. No respiratory distress.     Breath sounds: No wheezing or rales.  Abdominal:     General: There is no distension.     Palpations: Abdomen is soft.     Tenderness: There is no abdominal tenderness.  Musculoskeletal:        General: No tenderness.     Cervical back: Normal range of motion and neck supple.     Right lower leg: No edema.     Left lower leg: No edema.  Skin:    General: Skin is warm and dry.  Neurological:     General: No focal deficit present.     Mental Status: She is alert and oriented to person, place, and time.  Psychiatric:        Mood and Affect: Mood normal.        Behavior: Behavior normal.        Thought Content: Thought content normal.    Assessment & Plan:  1: Chronic heart failure with preserved ejection fraction  with structural changes (LVH/LAE)- - NYHA class II - euvolemic today - weighing daily; reminded to call for an overnight weight gain of > 2 pounds or a weekly weight gain of > 5 pounds - not adding salt to her food and tried to eat low sodium foods although did eat at [employer] for lunch prior to coming today; low sodium cookbook provided - will change her diuretic to torsemide 20mg  daily with additional 20mg  as needed for above weight gain, edema or shortness of breath; when she starts the torsemide, she will no longer take the furosemide - check BMP at next visit - has weekly home health nursing ad PT coming - BNP 11/03/21 was 176.5  2: HTN- - BP mildly elevated (133/56); changing diuretic per above - sees PCP ) 11/19/21 - BMP 11/05/21 reviewed and showed sodium 136, potassium 4.5, creatinine 1.07 and GFR 51  3: Persistent atrial fibrillation- - saw cardiology 14/12/22) 11/09/21 - sees EP 12/25/21  4: Tobacco use- - complete cessation discussed for 3 minutes    Patient did not bring her medications nor a list. Each medication was verbally reviewed with the patient and she was encouraged to bring the bottles to every visit to confirm accuracy of list.   Return in 1 month or sooner for any questions/problems before then.

## 2021-11-12 ENCOUNTER — Other Ambulatory Visit: Payer: Self-pay

## 2021-11-12 ENCOUNTER — Encounter: Payer: Self-pay | Admitting: Family

## 2021-11-12 ENCOUNTER — Ambulatory Visit: Payer: Medicare Other | Attending: Family | Admitting: Family

## 2021-11-12 VITALS — BP 133/56 | HR 58 | Resp 18 | Ht 62.0 in | Wt 197.1 lb

## 2021-11-12 DIAGNOSIS — E785 Hyperlipidemia, unspecified: Secondary | ICD-10-CM | POA: Diagnosis not present

## 2021-11-12 DIAGNOSIS — H409 Unspecified glaucoma: Secondary | ICD-10-CM | POA: Insufficient documentation

## 2021-11-12 DIAGNOSIS — I2510 Atherosclerotic heart disease of native coronary artery without angina pectoris: Secondary | ICD-10-CM | POA: Diagnosis not present

## 2021-11-12 DIAGNOSIS — J449 Chronic obstructive pulmonary disease, unspecified: Secondary | ICD-10-CM | POA: Insufficient documentation

## 2021-11-12 DIAGNOSIS — I10 Essential (primary) hypertension: Secondary | ICD-10-CM

## 2021-11-12 DIAGNOSIS — Z87891 Personal history of nicotine dependence: Secondary | ICD-10-CM | POA: Insufficient documentation

## 2021-11-12 DIAGNOSIS — I110 Hypertensive heart disease with heart failure: Secondary | ICD-10-CM | POA: Insufficient documentation

## 2021-11-12 DIAGNOSIS — R002 Palpitations: Secondary | ICD-10-CM | POA: Insufficient documentation

## 2021-11-12 DIAGNOSIS — D649 Anemia, unspecified: Secondary | ICD-10-CM | POA: Diagnosis not present

## 2021-11-12 DIAGNOSIS — I4819 Other persistent atrial fibrillation: Secondary | ICD-10-CM

## 2021-11-12 DIAGNOSIS — Z720 Tobacco use: Secondary | ICD-10-CM | POA: Diagnosis not present

## 2021-11-12 DIAGNOSIS — I5032 Chronic diastolic (congestive) heart failure: Secondary | ICD-10-CM | POA: Diagnosis not present

## 2021-11-12 MED ORDER — TORSEMIDE 20 MG PO TABS: 20.0000 mg | ORAL_TABLET | Freq: Every day | ORAL | 5 refills | Status: DC

## 2021-11-12 NOTE — Patient Instructions (Addendum)
Continue weighing daily and call for an overnight weight gain of 3 pounds or more or a weekly weight gain of more than 5 pounds.     When you pick up the torsemide, you will stop taking furosemide (lasix). You will take 1 tablet of torsemide daily but you can take an additional tablet if needed for above weight gain, shortness of breath or swelling.

## 2021-11-13 DIAGNOSIS — M50322 Other cervical disc degeneration at C5-C6 level: Secondary | ICD-10-CM | POA: Diagnosis not present

## 2021-11-13 DIAGNOSIS — M9901 Segmental and somatic dysfunction of cervical region: Secondary | ICD-10-CM | POA: Diagnosis not present

## 2021-11-13 NOTE — Addendum Note (Signed)
Addended by: Vernard Gambles on: 11/13/2021 02:00 PM   Modules accepted: Orders

## 2021-11-14 DIAGNOSIS — I4819 Other persistent atrial fibrillation: Secondary | ICD-10-CM | POA: Diagnosis not present

## 2021-11-14 DIAGNOSIS — I5033 Acute on chronic diastolic (congestive) heart failure: Secondary | ICD-10-CM | POA: Diagnosis not present

## 2021-11-14 DIAGNOSIS — I2510 Atherosclerotic heart disease of native coronary artery without angina pectoris: Secondary | ICD-10-CM | POA: Diagnosis not present

## 2021-11-14 DIAGNOSIS — J449 Chronic obstructive pulmonary disease, unspecified: Secondary | ICD-10-CM | POA: Diagnosis not present

## 2021-11-14 DIAGNOSIS — I110 Hypertensive heart disease with heart failure: Secondary | ICD-10-CM | POA: Diagnosis not present

## 2021-11-14 DIAGNOSIS — E1151 Type 2 diabetes mellitus with diabetic peripheral angiopathy without gangrene: Secondary | ICD-10-CM | POA: Diagnosis not present

## 2021-11-19 DIAGNOSIS — Z5181 Encounter for therapeutic drug level monitoring: Secondary | ICD-10-CM | POA: Diagnosis not present

## 2021-11-19 DIAGNOSIS — I272 Pulmonary hypertension, unspecified: Secondary | ICD-10-CM | POA: Diagnosis not present

## 2021-11-19 DIAGNOSIS — J449 Chronic obstructive pulmonary disease, unspecified: Secondary | ICD-10-CM | POA: Diagnosis not present

## 2021-11-19 DIAGNOSIS — Z8679 Personal history of other diseases of the circulatory system: Secondary | ICD-10-CM | POA: Diagnosis not present

## 2021-11-19 DIAGNOSIS — I482 Chronic atrial fibrillation, unspecified: Secondary | ICD-10-CM | POA: Diagnosis not present

## 2021-11-19 DIAGNOSIS — I5030 Unspecified diastolic (congestive) heart failure: Secondary | ICD-10-CM | POA: Diagnosis not present

## 2021-11-20 ENCOUNTER — Telehealth: Payer: Self-pay | Admitting: Family

## 2021-11-20 DIAGNOSIS — I110 Hypertensive heart disease with heart failure: Secondary | ICD-10-CM | POA: Diagnosis not present

## 2021-11-20 DIAGNOSIS — I5033 Acute on chronic diastolic (congestive) heart failure: Secondary | ICD-10-CM | POA: Diagnosis not present

## 2021-11-20 DIAGNOSIS — I4819 Other persistent atrial fibrillation: Secondary | ICD-10-CM | POA: Diagnosis not present

## 2021-11-20 DIAGNOSIS — E1151 Type 2 diabetes mellitus with diabetic peripheral angiopathy without gangrene: Secondary | ICD-10-CM | POA: Diagnosis not present

## 2021-11-20 DIAGNOSIS — J449 Chronic obstructive pulmonary disease, unspecified: Secondary | ICD-10-CM | POA: Diagnosis not present

## 2021-11-20 DIAGNOSIS — I2510 Atherosclerotic heart disease of native coronary artery without angina pectoris: Secondary | ICD-10-CM | POA: Diagnosis not present

## 2021-11-20 NOTE — Telephone Encounter (Signed)
Amy from [facility] called to discuss patient's weight. She says that she isn't sure her home weights are accurate due to patient not getting on/ off the scales in a timely fashion. Amy weighed patient today while she was there and she weighed 196.1 pounds. Home weight a week ago was 194.7 but, again, Amy isn't sure of accuracy. Amy also says that it looks like patient has some swelling in her legs.   Patient has order written for torsemide 20mg  daily with additional 20mg  if needed and Amy says that patient told her that she took an additional 20mg  today.   Instructed Amy to ask patient to note when she takes the additional 20mg  torsemide so that we would know if extra potassium is needed or if labs need to be drawn. Unless she takes more than 2 extra doses in a week, probably would not need lab work or additional potassium.

## 2021-11-21 ENCOUNTER — Ambulatory Visit: Payer: Medicare Other | Admitting: Physician Assistant

## 2021-11-21 DIAGNOSIS — M50322 Other cervical disc degeneration at C5-C6 level: Secondary | ICD-10-CM | POA: Diagnosis not present

## 2021-11-21 DIAGNOSIS — M9901 Segmental and somatic dysfunction of cervical region: Secondary | ICD-10-CM | POA: Diagnosis not present

## 2021-11-26 DIAGNOSIS — I4819 Other persistent atrial fibrillation: Secondary | ICD-10-CM | POA: Diagnosis not present

## 2021-11-26 DIAGNOSIS — J441 Chronic obstructive pulmonary disease with (acute) exacerbation: Secondary | ICD-10-CM | POA: Diagnosis not present

## 2021-11-26 DIAGNOSIS — I6523 Occlusion and stenosis of bilateral carotid arteries: Secondary | ICD-10-CM | POA: Diagnosis not present

## 2021-11-26 DIAGNOSIS — E785 Hyperlipidemia, unspecified: Secondary | ICD-10-CM | POA: Diagnosis not present

## 2021-11-26 DIAGNOSIS — I872 Venous insufficiency (chronic) (peripheral): Secondary | ICD-10-CM | POA: Diagnosis not present

## 2021-11-26 DIAGNOSIS — F17210 Nicotine dependence, cigarettes, uncomplicated: Secondary | ICD-10-CM | POA: Diagnosis not present

## 2021-11-26 DIAGNOSIS — I5033 Acute on chronic diastolic (congestive) heart failure: Secondary | ICD-10-CM | POA: Diagnosis not present

## 2021-11-26 DIAGNOSIS — E669 Obesity, unspecified: Secondary | ICD-10-CM | POA: Diagnosis not present

## 2021-11-26 DIAGNOSIS — I2510 Atherosclerotic heart disease of native coronary artery without angina pectoris: Secondary | ICD-10-CM | POA: Diagnosis not present

## 2021-11-26 DIAGNOSIS — E1151 Type 2 diabetes mellitus with diabetic peripheral angiopathy without gangrene: Secondary | ICD-10-CM | POA: Diagnosis not present

## 2021-11-26 DIAGNOSIS — Z7982 Long term (current) use of aspirin: Secondary | ICD-10-CM | POA: Diagnosis not present

## 2021-11-26 DIAGNOSIS — I110 Hypertensive heart disease with heart failure: Secondary | ICD-10-CM | POA: Diagnosis not present

## 2021-11-26 DIAGNOSIS — H409 Unspecified glaucoma: Secondary | ICD-10-CM | POA: Diagnosis not present

## 2021-11-26 DIAGNOSIS — Z6834 Body mass index (BMI) 34.0-34.9, adult: Secondary | ICD-10-CM | POA: Diagnosis not present

## 2021-11-26 DIAGNOSIS — Z7901 Long term (current) use of anticoagulants: Secondary | ICD-10-CM | POA: Diagnosis not present

## 2021-11-29 ENCOUNTER — Telehealth: Payer: Self-pay | Admitting: Family

## 2021-11-29 DIAGNOSIS — I5033 Acute on chronic diastolic (congestive) heart failure: Secondary | ICD-10-CM | POA: Diagnosis not present

## 2021-11-29 DIAGNOSIS — I2510 Atherosclerotic heart disease of native coronary artery without angina pectoris: Secondary | ICD-10-CM | POA: Diagnosis not present

## 2021-11-29 DIAGNOSIS — I4819 Other persistent atrial fibrillation: Secondary | ICD-10-CM | POA: Diagnosis not present

## 2021-11-29 DIAGNOSIS — I110 Hypertensive heart disease with heart failure: Secondary | ICD-10-CM | POA: Diagnosis not present

## 2021-11-29 DIAGNOSIS — J441 Chronic obstructive pulmonary disease with (acute) exacerbation: Secondary | ICD-10-CM | POA: Diagnosis not present

## 2021-11-29 DIAGNOSIS — E1151 Type 2 diabetes mellitus with diabetic peripheral angiopathy without gangrene: Secondary | ICD-10-CM | POA: Diagnosis not present

## 2021-11-29 DIAGNOSIS — I509 Heart failure, unspecified: Secondary | ICD-10-CM | POA: Diagnosis not present

## 2021-11-29 DIAGNOSIS — R0600 Dyspnea, unspecified: Secondary | ICD-10-CM | POA: Diagnosis not present

## 2021-11-29 NOTE — Telephone Encounter (Signed)
Spoke with Amy from [facility]. Patient says that she has taken additional 20mg  torsemide for a few days but weight/ swelling is unchanged. Has been taking potassium daily but didn't take any extra with the extra torsemide.   Weight has ranged from 195-198 pounds with increase in her SOB. Amy says that patient's lungs are clear with sats between 96-98% but with very tight legs.   Amy will draw BMP/BNP today to run STAT and will call me with results. May need metolazone.

## 2021-11-30 ENCOUNTER — Telehealth: Payer: Self-pay | Admitting: Family

## 2021-11-30 MED ORDER — METOLAZONE 2.5 MG PO TABS: 2.5000 mg | ORAL_TABLET | Freq: Every day | ORAL | 0 refills | Status: DC

## 2021-11-30 NOTE — Telephone Encounter (Signed)
Received lab results from Aimee (home health nurse) that were obtained yesterday. Sodium is 145, potassium 5.3, creatinine 1.12 and GFR 48. BNP elevated at 2469.  Advised Aimee that I will add metolazone 2.5mg  daily for 2 days. She is to take it 1/2 hour prior to her morning torsemide. She will take an additional potassium on the 2 days that she takes the metolazone. Aimee verbalized understanding and read back the instructions.   Aimee will check lab work next week at her visit.

## 2021-12-04 DIAGNOSIS — H402224 Chronic angle-closure glaucoma, left eye, indeterminate stage: Secondary | ICD-10-CM | POA: Diagnosis not present

## 2021-12-04 DIAGNOSIS — I10 Essential (primary) hypertension: Secondary | ICD-10-CM | POA: Diagnosis not present

## 2021-12-04 DIAGNOSIS — J441 Chronic obstructive pulmonary disease with (acute) exacerbation: Secondary | ICD-10-CM | POA: Diagnosis not present

## 2021-12-04 DIAGNOSIS — M8580 Other specified disorders of bone density and structure, unspecified site: Secondary | ICD-10-CM | POA: Diagnosis not present

## 2021-12-04 DIAGNOSIS — E782 Mixed hyperlipidemia: Secondary | ICD-10-CM | POA: Diagnosis not present

## 2021-12-04 DIAGNOSIS — I2510 Atherosclerotic heart disease of native coronary artery without angina pectoris: Secondary | ICD-10-CM | POA: Diagnosis not present

## 2021-12-04 DIAGNOSIS — J449 Chronic obstructive pulmonary disease, unspecified: Secondary | ICD-10-CM | POA: Diagnosis not present

## 2021-12-04 DIAGNOSIS — H409 Unspecified glaucoma: Secondary | ICD-10-CM | POA: Diagnosis not present

## 2021-12-06 ENCOUNTER — Other Ambulatory Visit: Payer: Self-pay | Admitting: Family

## 2021-12-06 ENCOUNTER — Telehealth: Payer: Self-pay | Admitting: Family

## 2021-12-06 DIAGNOSIS — E1151 Type 2 diabetes mellitus with diabetic peripheral angiopathy without gangrene: Secondary | ICD-10-CM | POA: Diagnosis not present

## 2021-12-06 DIAGNOSIS — I4819 Other persistent atrial fibrillation: Secondary | ICD-10-CM | POA: Diagnosis not present

## 2021-12-06 DIAGNOSIS — I5033 Acute on chronic diastolic (congestive) heart failure: Secondary | ICD-10-CM | POA: Diagnosis not present

## 2021-12-06 DIAGNOSIS — I110 Hypertensive heart disease with heart failure: Secondary | ICD-10-CM | POA: Diagnosis not present

## 2021-12-06 DIAGNOSIS — I2510 Atherosclerotic heart disease of native coronary artery without angina pectoris: Secondary | ICD-10-CM | POA: Diagnosis not present

## 2021-12-06 DIAGNOSIS — J441 Chronic obstructive pulmonary disease with (acute) exacerbation: Secondary | ICD-10-CM | POA: Diagnosis not present

## 2021-12-06 NOTE — Telephone Encounter (Signed)
Received phone call from Aimee (home health) regarding patient symptoms/ weight. Patient took metolazone 12/01/21 & 12/02/21 without improvement in her symptoms.   Weight is unchanged at 198 pounds, HR ranging from 49-52 with BP of 120/60. Aimee says that leg edema looks a little worse and abdominal girth has increased 2 cm. Lungs are CTA.   Have scheduled patient for 80mg  IV lasix (no potassium as most recent K+ was elevated) tomorrow. Will check BMP/BNP tomorrow as well.   She can double her torsemide if needed over the holiday weekend. Aimee will visit patient in the home on 12/11/21 and update me at that time.

## 2021-12-06 NOTE — Progress Notes (Signed)
Orders placed for IV lasix tomorrow

## 2021-12-07 ENCOUNTER — Ambulatory Visit
Admission: RE | Admit: 2021-12-07 | Discharge: 2021-12-07 | Disposition: A | Discharge status: Home / Self Care | Payer: Medicare Other | Source: Ambulatory Visit | Attending: Family | Admitting: Family

## 2021-12-07 ENCOUNTER — Other Ambulatory Visit: Payer: Self-pay

## 2021-12-07 DIAGNOSIS — I5033 Acute on chronic diastolic (congestive) heart failure: Secondary | ICD-10-CM | POA: Insufficient documentation

## 2021-12-07 LAB — BASIC METABOLIC PANEL
Anion gap: 9 (ref 5–15)
BUN: 41 mg/dL — ABNORMAL HIGH (ref 8–23)
CO2: 26 mmol/L (ref 22–32)
Calcium: 8.4 mg/dL — ABNORMAL LOW (ref 8.9–10.3)
Chloride: 104 mmol/L (ref 98–111)
Creatinine, Ser: 1 mg/dL (ref 0.44–1.00)
GFR, Estimated: 55 mL/min — ABNORMAL LOW (ref >60)
Glucose, Bld: 112 mg/dL — ABNORMAL HIGH (ref 70–99)
Potassium: 4.1 mmol/L (ref 3.5–5.1)
Sodium: 139 mmol/L (ref 135–145)

## 2021-12-07 LAB — BRAIN NATRIURETIC PEPTIDE: B Natriuretic Peptide: 371.3 pg/mL — ABNORMAL HIGH (ref 0.0–100.0)

## 2021-12-07 MED ORDER — POTASSIUM CHLORIDE CRYS ER 20 MEQ PO TBCR
EXTENDED_RELEASE_TABLET | ORAL | Status: AC
  Filled 2021-12-07: qty 1

## 2021-12-07 MED ORDER — SODIUM CHLORIDE FLUSH 0.9 % IV SOLN
INTRAVENOUS | Status: AC
  Filled 2021-12-07: qty 10

## 2021-12-07 MED ORDER — FUROSEMIDE 10 MG/ML IJ SOLN
INTRAMUSCULAR | Status: AC
  Administered 2021-12-07: 14:00:00 80 mg via INTRAVENOUS
  Filled 2021-12-07: qty 8

## 2021-12-07 MED ORDER — FUROSEMIDE 10 MG/ML IJ SOLN: 80.0000 mg | Freq: Once | INTRAMUSCULAR | Status: AC

## 2021-12-07 MED ORDER — POTASSIUM CHLORIDE CRYS ER 20 MEQ PO TBCR
20.0000 meq | EXTENDED_RELEASE_TABLET | Freq: Once | ORAL | Status: AC
  Administered 2021-12-07: 14:00:00 20 meq via ORAL

## 2021-12-11 NOTE — Progress Notes (Deleted)
Patient ID: Jenny [name], female    DOB: 1933-10-03, 88 y.o.   MRN: [id number]  HPI  Jenny [name] is a 88 y/o female with a history of CAD, hyperlipidemia, HTN, anemia, COPD, glaucoma, atrial fibrillation, recent tobacco use and chronic heart failure.   Echo report from 08/22/21 reviewed and showed an EF of 60-65% along with mild LVH/ LAE, mild MR and severely elevated PA pressure of 65.1 mmHg.   Admitted 11/03/21 due to acute on chronic heart failure. Needed oxygen at 3L but then able to be weaned down to room air. Initially given IV lasix with transition to oral diuretics. PT evaluation done. Discharged after 3 days.   She presents today for a follow-up visit with a chief complaint of minimal fatigue upon moderate exertion. She describes this as chronic in nature having been present for several years. She has associated shortness of breath, pedal edema, occasional palpitations, leg weakness and difficulty sleeping along with this. She denies any dizziness, abdominal distention, chest pain, cough or weight gain.   She received 80mg  IV lasix/ 44meq PO potassium last week with minimal results. Also took 2 doses of metolazone last week with little improvement. Has been taking 60mg  torsemide daily for the last couple of days and feels like her symptoms have improved some with that dose.   Wearing compression socks daily.   Past Medical History:  Diagnosis Date   ACUT DUOD ULCER W/HEMORR&PERF W/O MENTION OBST 10/05/2009   NSAID induced   ALLERGIC RHINITIS CAUSE UNSPECIFIED    ANEMIA-NOS    CAD (coronary artery disease) 06/08/2009   DEs RCA with 70% LAD and EF 60%   CHF (congestive heart failure) (HCC)    COPD    mild obst on PFTs 03/2010   Diabetes mellitus 06/2010 dx   Mild, diet controlled   GLAUCOMA    HYPERLIPIDEMIA    HYPERTENSION, BENIGN    MYOCARDIAL INFARCTION 06/08/2009   des to rca   Persistent atrial fibrillation ([place])    Dx 08/2021   TOBACCO ABUSE    Past Surgical History:   Procedure Laterality Date   HEMORRHOID SURGERY  1990   Right knee surgery     Family History  Problem Relation Age of Onset   Hypertension Mother    Ulcers Mother    Stomach cancer Father    Stroke Maternal Grandmother    Heart attack Neg Hx    Social History   Tobacco Use   Smoking status: Former    Packs/day: 0.50    Years: 60.00    Pack years: 30.00    Types: Cigarettes    Quit date: 11/12/2021    Years since quitting: 0.0   Smokeless tobacco: Never   Tobacco comments:    She lives in [place] with her significant other (Jenny [name])0  Substance Use Topics   Alcohol use: Yes    Comment: Occassional   Allergies  Allergen Reactions   Cyclobenzaprine     Other reaction(s): muscle relaxers-ulcer hemorrhage (hospitalized)   Lactose Intolerance (Gi)    Propoxyphene     Other reaction(s): pain meds, hallucination, vomiting   Atorvastatin Itching    Itching and myaliga   Meperidine Hcl Other (See Comments)    Not known   Pravastatin Rash    rash   Prior to Admission medications   Medication Sig Start Date End Date Taking? Authorizing Provider  albuterol (VENTOLIN HFA) 108 (90 Base) MCG/ACT inhaler INHALE ONE PUFF EVERY 6 HOURS AS NEEDED FOR  SHORTNESS OF BREATH 11/03/21  Yes Lucrezia Starch, MD  amLODipine (NORVASC) 10 MG tablet Take 1 tablet (10 mg total) by mouth daily. 10/17/21  Yes Burnell Blanks, MD  apixaban (ELIQUIS) 5 MG TABS tablet Take 1 tablet (5 mg total) by mouth 2 (two) times daily. 10/11/21  Yes Turner, Eber Hong, MD  dorzolamide (TRUSOPT) 2 % ophthalmic solution Place 1 drop into both eyes 2 (two) times daily.   Yes [provider]  famotidine (PEPCID) 20 MG tablet Take 20 mg by mouth 2 (two) times daily as needed for heartburn.   Yes [provider]  Fluticasone-Umeclidin-Vilant (TRELEGY ELLIPTA) 100-62.5-25 MCG/INH AEPB Inhale 1 puff into the lungs daily. 08/27/21  Yes Mercy Riding, MD  gabapentin (NEURONTIN) 100 MG capsule Take  100-200 mg by mouth See admin instructions. Take 1 capsule (100mg ) by mouth every morning and take 2 capsules (200mg ) by mouth at bedtime   Yes [provider]  ipratropium-albuterol (DUONEB) 0.5-2.5 (3) MG/3ML SOLN Take 3 mLs by nebulization every 6 (six) hours as needed. 11/06/21  Yes Fritzi Mandes, MD  latanoprost (XALATAN) 0.005 % ophthalmic solution Place 1 drop into both eyes in the morning.   Yes [provider]  losartan (COZAAR) 50 MG tablet Take 1 tablet (50 mg total) by mouth in the morning and at bedtime. 10/23/21  Yes Burnell Blanks, MD  magnesium hydroxide (MILK OF MAGNESIA) 400 MG/5ML suspension Take 30 mLs by mouth as needed for mild constipation.   Yes [provider]  NITROSTAT 0.4 MG SL tablet DISSOLVE ONE TABLET UNDER TONGUE AS NEEDED FOR CHEST PAIN - MAY REPEAT TWICE-IF NO RELIEF GO TO NEAREST HOSPITAL ER 05/06/13  Yes Rowe Clack, MD  potassium chloride (KLOR-CON) 10 MEQ tablet Take 20 mEq by mouth daily.   Yes [provider]  rosuvastatin (CRESTOR) 20 MG tablet Take 20 mg by mouth daily.   Yes [provider]  torsemide (DEMADEX) 20 MG tablet Take 1 tablet (20 mg total) by mouth daily. And additional 20mg  if needed for weight gain, shortness of breath or swelling Patient taking differently: Take 60 mg by mouth daily. And additional 20mg  if needed for weight gain, shortness of breath or swelling 11/12/21 02/10/22 Yes Daveon Arpino A, FNP  metolazone (ZAROXOLYN) 2.5 MG tablet Take 1 tablet (2.5 mg total) by mouth daily. Take 1/2 hour prior to morning fluid pill Patient not taking: Reported on 12/12/2021 11/30/21 02/28/22  Alisa Graff, FNP    Review of Systems  Constitutional:  Positive for fatigue. Negative for appetite change.  HENT:  Negative for congestion, postnasal drip and sore throat.   Eyes: Negative.   Respiratory:  Positive for shortness of breath. Negative for cough, chest tightness and wheezing.    Cardiovascular:  Positive for palpitations (sometimes) and leg swelling. Negative for chest pain.  Gastrointestinal:  Negative for abdominal distention and abdominal pain.  Endocrine: Negative.   Genitourinary: Negative.   Musculoskeletal:  Negative for back pain and neck pain.  Skin: Negative.   Allergic/Immunologic: Negative.   Neurological:  Positive for weakness (legs when walking long distance). Negative for dizziness and light-headedness.  Hematological:  Negative for adenopathy. Bruises/bleeds easily.  Psychiatric/Behavioral:  Positive for sleep disturbance (chronic issue; sleeping sitting on sofa). Negative for dysphoric mood. The patient is not nervous/anxious.    Vitals:   12/12/21 1128  BP: (!) 114/50  Pulse: (!) 58  Resp: 18  SpO2: 100%  Weight: 197 lb 4 oz (  89.5 kg)  Height: 5\' 2"  (1.575 m)   Wt Readings from Last 3 Encounters:  12/12/21 197 lb 4 oz (89.5 kg)  11/12/21 197 lb 2 oz (89.4 kg)  11/09/21 191 lb 9.6 oz (86.9 kg)   Lab Results  Component Value Date   CREATININE 1.00 12/07/2021   CREATININE 1.07 (H) 11/05/2021   CREATININE 0.86 11/04/2021   Physical Exam Vitals and nursing note reviewed. Exam conducted with a chaperone present (son).  Constitutional:      Appearance: Normal appearance.  HENT:     Head: Normocephalic.  Cardiovascular:     Rate and Rhythm: Bradycardia present. Rhythm irregular.  Pulmonary:     Effort: Pulmonary effort is normal. No respiratory distress.     Breath sounds: No wheezing or rales.  Abdominal:     General: There is no distension.     Palpations: Abdomen is soft.     Tenderness: There is no abdominal tenderness.  Musculoskeletal:        General: No tenderness.     Cervical back: Normal range of motion and neck supple.     Right lower leg: Edema (trace pitting) present.     Left lower leg: Edema (trace pitting) present.  Skin:    General: Skin is warm and dry.  Neurological:     General: No focal deficit present.      Mental Status: She is alert and oriented to person, place, and time.  Psychiatric:        Mood and Affect: Mood normal.        Behavior: Behavior normal.        Thought Content: Thought content normal.    Assessment & Plan:  1: Chronic heart failure with preserved ejection fraction with structural changes (LVH/LAE)- - NYHA class II - euvolemic today - weighing daily; reminded to call for an overnight weight gain of > 2 pounds or a weekly weight gain of > 5 pounds - weight unchanged from last visit here 1 month ago - not adding salt to her food and tries to eat low sodium foods  - check BMP today as she's now taking 60mg  torsemide daily; received 80mg  IV lasix/ 56meq PO potassium last week with little improvement in symptoms - will stop her losartan and begin entresto 24/26mg  BID; 30 day voucher given to her - will check BMP next visit due to starting entresto - has weekly home health nursing ad PT coming - BNP 11/03/21 was 176.5  2: HTN- - BP looks good (114/50) - sees PCP Laurann Montana)  - BMP 12/07/21 reviewed and showed sodium 139, potassium 4.1, creatinine 1.0 and GFR 55  3: Persistent atrial fibrillation- - saw cardiology Julianne Handler) 11/09/21 - sees EP 12/25/21  4: Tobacco use- - stopped smoking 6 weeks ago - complete cessation discussed for 3 minutes    Medication bottles reviewed.   Return in 1 month or sooner for any questions/problems before then.

## 2021-12-12 ENCOUNTER — Ambulatory Visit (HOSPITAL_BASED_OUTPATIENT_CLINIC_OR_DEPARTMENT_OTHER): Payer: Medicare Other | Admitting: Family

## 2021-12-12 ENCOUNTER — Other Ambulatory Visit: Payer: Self-pay

## 2021-12-12 ENCOUNTER — Encounter: Payer: Self-pay | Admitting: Family

## 2021-12-12 ENCOUNTER — Other Ambulatory Visit
Admission: RE | Admit: 2021-12-12 | Discharge: 2021-12-12 | Disposition: A | Discharge status: Home / Self Care | Payer: Medicare Other | Source: Ambulatory Visit | Attending: Family | Admitting: Family

## 2021-12-12 VITALS — BP 114/50 | HR 58 | Resp 18 | Ht 62.0 in | Wt 197.2 lb

## 2021-12-12 DIAGNOSIS — I10 Essential (primary) hypertension: Secondary | ICD-10-CM | POA: Diagnosis not present

## 2021-12-12 DIAGNOSIS — J449 Chronic obstructive pulmonary disease, unspecified: Secondary | ICD-10-CM | POA: Insufficient documentation

## 2021-12-12 DIAGNOSIS — Z79899 Other long term (current) drug therapy: Secondary | ICD-10-CM | POA: Insufficient documentation

## 2021-12-12 DIAGNOSIS — I5032 Chronic diastolic (congestive) heart failure: Secondary | ICD-10-CM | POA: Insufficient documentation

## 2021-12-12 DIAGNOSIS — R002 Palpitations: Secondary | ICD-10-CM | POA: Insufficient documentation

## 2021-12-12 DIAGNOSIS — I4819 Other persistent atrial fibrillation: Secondary | ICD-10-CM | POA: Insufficient documentation

## 2021-12-12 DIAGNOSIS — E785 Hyperlipidemia, unspecified: Secondary | ICD-10-CM | POA: Insufficient documentation

## 2021-12-12 DIAGNOSIS — I110 Hypertensive heart disease with heart failure: Secondary | ICD-10-CM | POA: Insufficient documentation

## 2021-12-12 DIAGNOSIS — I2510 Atherosclerotic heart disease of native coronary artery without angina pectoris: Secondary | ICD-10-CM | POA: Insufficient documentation

## 2021-12-12 DIAGNOSIS — Z87891 Personal history of nicotine dependence: Secondary | ICD-10-CM | POA: Insufficient documentation

## 2021-12-12 DIAGNOSIS — Z720 Tobacco use: Secondary | ICD-10-CM

## 2021-12-12 DIAGNOSIS — H409 Unspecified glaucoma: Secondary | ICD-10-CM | POA: Insufficient documentation

## 2021-12-12 DIAGNOSIS — D649 Anemia, unspecified: Secondary | ICD-10-CM | POA: Insufficient documentation

## 2021-12-12 LAB — BASIC METABOLIC PANEL
Anion gap: 13 (ref 5–15)
BUN: 64 mg/dL — ABNORMAL HIGH (ref 8–23)
CO2: 29 mmol/L (ref 22–32)
Calcium: 8.3 mg/dL — ABNORMAL LOW (ref 8.9–10.3)
Chloride: 96 mmol/L — ABNORMAL LOW (ref 98–111)
Creatinine, Ser: 1.43 mg/dL — ABNORMAL HIGH (ref 0.44–1.00)
GFR, Estimated: 36 mL/min — ABNORMAL LOW (ref >60)
Glucose, Bld: 100 mg/dL — ABNORMAL HIGH (ref 70–99)
Potassium: 5.2 mmol/L — ABNORMAL HIGH (ref 3.5–5.1)
Sodium: 138 mmol/L (ref 135–145)

## 2021-12-12 MED ORDER — SACUBITRIL-VALSARTAN 24-26 MG PO TABS: 1.0000 | ORAL_TABLET | Freq: Two times a day (BID) | ORAL | 3 refills | Status: DC

## 2021-12-12 NOTE — Progress Notes (Signed)
Patient ID: Jenny [name], female    DOB: Apr 28, 1933, 88 y.o.   MRN: [id number]  HPI  Ms Blauser is a 88 y/o female with a history of CAD, hyperlipidemia, HTN, anemia, COPD, glaucoma, atrial fibrillation, recent tobacco use and chronic heart failure.   Echo report from 08/22/21 reviewed and showed an EF of 60-65% along with mild LVH/ LAE, mild MR and severely elevated PA pressure of 65.1 mmHg.   Admitted 11/03/21 due to acute on chronic heart failure. Needed oxygen at 3L but then able to be weaned down to room air. Initially given IV lasix with transition to oral diuretics. PT evaluation done. Discharged after 3 days.   She presents today for a follow-up visit with a chief complaint of minimal fatigue upon moderate exertion. She describes this as chronic in nature having been present for several years. She has associated shortness of breath, pedal edema, occasional palpitations and difficulty sleeping along with this. She denies any dizziness, abdominal distention, chest pain or cough.   Received 80mg  IV lasix last week with little improvement of symptoms. Has been taking 60mg  torsemide daily for the last 2 days and says that she feels better since doing that.   Past Medical History:  Diagnosis Date   ACUT DUOD ULCER W/HEMORR&PERF W/O MENTION OBST 10/05/2009   NSAID induced   ALLERGIC RHINITIS CAUSE UNSPECIFIED    ANEMIA-NOS    CAD (coronary artery disease) 06/08/2009   DEs RCA with 70% LAD and EF 60%   CHF (congestive heart failure) (HCC)    COPD    mild obst on PFTs 03/2010   Diabetes mellitus 06/2010 dx   Mild, diet controlled   GLAUCOMA    HYPERLIPIDEMIA    HYPERTENSION, BENIGN    MYOCARDIAL INFARCTION 06/08/2009   des to rca   Persistent atrial fibrillation ([place])    Dx 08/2021   TOBACCO ABUSE    Past Surgical History:  Procedure Laterality Date   HEMORRHOID SURGERY  1990   Right knee surgery     Family History  Problem Relation Age of Onset   Hypertension Mother    Ulcers  Mother    Stomach cancer Father    Stroke Maternal Grandmother    Heart attack Neg Hx    Social History   Tobacco Use   Smoking status: Former    Packs/day: 0.50    Years: 60.00    Pack years: 30.00    Types: Cigarettes    Quit date: 11/12/2021    Years since quitting: 0.0   Smokeless tobacco: Never   Tobacco comments:    She lives in [place] with her significant other (Charles Hook)0  Substance Use Topics   Alcohol use: Yes    Comment: Occassional   Allergies  Allergen Reactions   Cyclobenzaprine     Other reaction(s): muscle relaxers-ulcer hemorrhage (hospitalized)   Lactose Intolerance (Gi)    Propoxyphene     Other reaction(s): pain meds, hallucination, vomiting   Atorvastatin Itching    Itching and myaliga   Meperidine Hcl Other (See Comments)    Not known   Pravastatin Rash    rash   Prior to Admission medications   Medication Sig Start Date End Date Taking? Authorizing Provider  albuterol (VENTOLIN HFA) 108 (90 Base) MCG/ACT inhaler INHALE ONE PUFF EVERY 6 HOURS AS NEEDED FOR SHORTNESS OF BREATH 11/03/21  Yes Lucrezia Starch, MD  amLODipine (NORVASC) 10 MG tablet Take 1 tablet (10 mg total) by mouth daily. 10/17/21  Yes Burnell Blanks, MD  apixaban (ELIQUIS) 5 MG TABS tablet Take 1 tablet (5 mg total) by mouth 2 (two) times daily. 10/11/21  Yes Turner, Eber Hong, MD  dorzolamide (TRUSOPT) 2 % ophthalmic solution Place 1 drop into both eyes 2 (two) times daily.   Yes [provider]  famotidine (PEPCID) 20 MG tablet Take 20 mg by mouth 2 (two) times daily as needed for heartburn.   Yes [provider]  Fluticasone-Umeclidin-Vilant (TRELEGY ELLIPTA) 100-62.5-25 MCG/INH AEPB Inhale 1 puff into the lungs daily. 08/27/21  Yes Mercy Riding, MD  furosemide (LASIX) 20 MG tablet Take 40 mg on 11/30 and then from 11/08/21 take 20 mg daily. Take additional 20 mg as needed on days when you feel sob or have increased leg edema 11/06/21  Yes Fritzi Mandes, MD   gabapentin (NEURONTIN) 100 MG capsule Take 100-200 mg by mouth See admin instructions. Take 1 capsule (100mg ) by mouth every morning and take 2 capsules (200mg ) by mouth at bedtime   Yes [provider]  ipratropium-albuterol (DUONEB) 0.5-2.5 (3) MG/3ML SOLN Take 3 mLs by nebulization every 6 (six) hours as needed. 11/06/21  Yes Fritzi Mandes, MD  latanoprost (XALATAN) 0.005 % ophthalmic solution Place 1 drop into both eyes in the morning.   Yes [provider]  losartan (COZAAR) 50 MG tablet Take 1 tablet (50 mg total) by mouth in the morning and at bedtime. 10/23/21  Yes [provider]  magnesium hydroxide (MILK OF MAGNESIA) 400 MG/5ML suspension Take 30 mLs by mouth as needed for mild constipation.   Yes [provider]  NITROSTAT 0.4 MG SL tablet DISSOLVE ONE TABLET UNDER TONGUE AS NEEDED FOR CHEST PAIN - MAY REPEAT TWICE-IF NO RELIEF GO TO NEAREST HOSPITAL ER 05/06/13  Yes Rowe Clack, MD  potassium chloride (KLOR-CON) 10 MEQ tablet Take 10 mEq by mouth daily as needed (supplementation with furosemide).   Yes [provider]  rosuvastatin (CRESTOR) 20 MG tablet Take 20 mg by mouth daily.   Yes [provider]   Review of Systems  Constitutional:  Positive for fatigue. Negative for appetite change.  HENT:  Negative for congestion, postnasal drip and sore throat.   Eyes: Negative.   Respiratory:  Positive for shortness of breath. Negative for cough and chest tightness.   Cardiovascular:  Positive for palpitations (sometimes) and leg swelling. Negative for chest pain.  Gastrointestinal:  Negative for abdominal distention and abdominal pain.  Endocrine: Negative.   Genitourinary: Negative.   Musculoskeletal:  Negative for back pain and neck pain.  Skin: Negative.   Allergic/Immunologic: Negative.   Neurological:  Negative for dizziness and light-headedness.  Hematological:  Negative for adenopathy. Bruises/bleeds easily.   Psychiatric/Behavioral:  Positive for sleep disturbance (chronic issue; sleeping sitting on sofa). Negative for dysphoric mood. The patient is not nervous/anxious.    Vitals:   12/12/21 1128  BP: (!) 114/50  Pulse: (!) 58  Resp: 18  SpO2: 100%  Weight: 197 lb 4 oz (89.5 kg)  Height: 5\' 2"  (1.575 m)   Wt Readings from Last 3 Encounters:  12/12/21 197 lb 4 oz (89.5 kg)  11/12/21 197 lb 2 oz (89.4 kg)  11/09/21 191 lb 9.6 oz (86.9 kg)   Lab Results  Component Value Date   CREATININE 1.00 12/07/2021   CREATININE 1.07 (H) 11/05/2021   CREATININE 0.86 11/04/2021   Physical Exam Vitals and nursing note reviewed. Exam conducted with a chaperone present (son).  Constitutional:  Appearance: Normal appearance.  HENT:     Head: Normocephalic.  Cardiovascular:     Rate and Rhythm: Bradycardia present. Rhythm irregular.  Pulmonary:     Effort: Pulmonary effort is normal. No respiratory distress.     Breath sounds: No wheezing or rales.  Abdominal:     General: There is no distension.     Palpations: Abdomen is soft.     Tenderness: There is no abdominal tenderness.  Musculoskeletal:        General: No tenderness.     Cervical back: Normal range of motion and neck supple.     Right lower leg: No edema.     Left lower leg: No edema.  Skin:    General: Skin is warm and dry.  Neurological:     General: No focal deficit present.     Mental Status: She is alert and oriented to person, place, and time.  Psychiatric:        Mood and Affect: Mood normal.        Behavior: Behavior normal.        Thought Content: Thought content normal.    Assessment & Plan:  1: Chronic heart failure with preserved ejection fraction with structural changes (LVH/LAE)- - NYHA class II - euvolemic today - weighing daily; reminded to call for an overnight weight gain of > 2 pounds or a weekly weight gain of > 5 pounds - weight unchanged from last visit here 1 month ago - not adding salt to her  food and tries to eat low sodium foods  - check BMP today since diuretic was changed to torsemide at last visit; continue 60mg  daily for now - will stop her losartan and begin entresto 24/26mg  BID; 30 day voucher provided - check BMP next visit - should BP get low, will stop her amlodipine - has weekly home health nursing ad PT coming - BNP 11/03/21 was 176.5  2: HTN- - BP looks good (114/58) - sees PCP Laurann Montana) 11/19/21 - BMP 12/07/21 reviewed and showed sodium 139, potassium 4.1, creatinine 1.00 and GFR 55  3: Persistent atrial fibrillation- - saw cardiology Julianne Handler) 11/09/21 - sees EP 12/25/21  4: Tobacco use- - stopped smoking 6 weeks ago - continued cessation discussed for 3 minutes    Medication bottles reviewed.   Return in 1 month or sooner for any questions/problems before then.   Marland Kitchen

## 2021-12-12 NOTE — Patient Instructions (Addendum)
The [facility] will be moving around the corner to [address] mid-February. Our phone number will remain the same.   Continue weighing daily and call for an overnight weight gain of 3 pounds or more or a weekly weight gain of more than 5 pounds.   We will check your labs today and call you with the results.    Return in 3 months.

## 2021-12-13 ENCOUNTER — Telehealth: Payer: Self-pay

## 2021-12-13 NOTE — Telephone Encounter (Addendum)
Message below with lab results called to patient. Patient acknowledged, and verbalized that she is to decrease torsemide to 2 tablets a day, and to stop taking potassium. Patient stated she will weigh every day and call us if she sees a weight gain.  [name], RN ----- Message from Alisa Graff, [place] sent at 12/13/2021  8:41 AM EST ----- Potassium remains a little on the high side so stop taking any potassium tablets. Kidney function is a little worse since getting the IV lasix last week and taking 3 tablets of torsemide daily. Decrease the torsemide to 2 tablets daily. Started the entresto so hopefully you won't need the extra torsemide. Your home health nurse Aimee will check lab work next week when she sees you. Keep weighing yourself daily

## 2021-12-14 ENCOUNTER — Other Ambulatory Visit: Payer: Self-pay | Admitting: Family

## 2021-12-14 DIAGNOSIS — E1151 Type 2 diabetes mellitus with diabetic peripheral angiopathy without gangrene: Secondary | ICD-10-CM | POA: Diagnosis not present

## 2021-12-14 DIAGNOSIS — I4819 Other persistent atrial fibrillation: Secondary | ICD-10-CM | POA: Diagnosis not present

## 2021-12-14 DIAGNOSIS — I110 Hypertensive heart disease with heart failure: Secondary | ICD-10-CM | POA: Diagnosis not present

## 2021-12-14 DIAGNOSIS — J441 Chronic obstructive pulmonary disease with (acute) exacerbation: Secondary | ICD-10-CM | POA: Diagnosis not present

## 2021-12-14 DIAGNOSIS — I2510 Atherosclerotic heart disease of native coronary artery without angina pectoris: Secondary | ICD-10-CM | POA: Diagnosis not present

## 2021-12-14 DIAGNOSIS — I5033 Acute on chronic diastolic (congestive) heart failure: Secondary | ICD-10-CM | POA: Diagnosis not present

## 2021-12-14 MED ORDER — TORSEMIDE 40 MG PO TABS: 40.0000 mg | ORAL_TABLET | Freq: Every day | ORAL | 5 refills | Status: DC

## 2021-12-18 DIAGNOSIS — R0600 Dyspnea, unspecified: Secondary | ICD-10-CM | POA: Diagnosis not present

## 2021-12-18 DIAGNOSIS — I509 Heart failure, unspecified: Secondary | ICD-10-CM | POA: Diagnosis not present

## 2021-12-18 DIAGNOSIS — I110 Hypertensive heart disease with heart failure: Secondary | ICD-10-CM | POA: Diagnosis not present

## 2021-12-18 DIAGNOSIS — E875 Hyperkalemia: Secondary | ICD-10-CM | POA: Diagnosis not present

## 2021-12-18 DIAGNOSIS — E1151 Type 2 diabetes mellitus with diabetic peripheral angiopathy without gangrene: Secondary | ICD-10-CM | POA: Diagnosis not present

## 2021-12-18 DIAGNOSIS — I4819 Other persistent atrial fibrillation: Secondary | ICD-10-CM | POA: Diagnosis not present

## 2021-12-18 DIAGNOSIS — I2510 Atherosclerotic heart disease of native coronary artery without angina pectoris: Secondary | ICD-10-CM | POA: Diagnosis not present

## 2021-12-18 DIAGNOSIS — J441 Chronic obstructive pulmonary disease with (acute) exacerbation: Secondary | ICD-10-CM | POA: Diagnosis not present

## 2021-12-18 DIAGNOSIS — I5033 Acute on chronic diastolic (congestive) heart failure: Secondary | ICD-10-CM | POA: Diagnosis not present

## 2021-12-19 ENCOUNTER — Telehealth: Payer: Self-pay | Admitting: Family

## 2021-12-19 NOTE — Telephone Encounter (Signed)
Spoke with Aimee from [facility] regarding most recent labs. Labs drawn 12/18/21 showed: Potassium 5.7 Sodium 145 Creatinine 1.24 GFR 42 proBNP 3069  Advised Aimee to make sure patient is no longer taking potassium supplements and that she's not using NoSalt or eating lots of bananas.   Will increase her torsemide to 60mg  daily to help with her fluid retention which should also help decrease her potassium level. If not, may need to stop the entresto and/or make nephrology appointment.   Aimee will check BMP next week and fax results to .

## 2021-12-20 ENCOUNTER — Other Ambulatory Visit: Payer: Self-pay | Admitting: Family

## 2021-12-24 DIAGNOSIS — I5033 Acute on chronic diastolic (congestive) heart failure: Secondary | ICD-10-CM | POA: Diagnosis not present

## 2021-12-24 DIAGNOSIS — E875 Hyperkalemia: Secondary | ICD-10-CM | POA: Diagnosis not present

## 2021-12-24 DIAGNOSIS — J441 Chronic obstructive pulmonary disease with (acute) exacerbation: Secondary | ICD-10-CM | POA: Diagnosis not present

## 2021-12-24 DIAGNOSIS — I2510 Atherosclerotic heart disease of native coronary artery without angina pectoris: Secondary | ICD-10-CM | POA: Diagnosis not present

## 2021-12-24 DIAGNOSIS — E1151 Type 2 diabetes mellitus with diabetic peripheral angiopathy without gangrene: Secondary | ICD-10-CM | POA: Diagnosis not present

## 2021-12-24 DIAGNOSIS — I509 Heart failure, unspecified: Secondary | ICD-10-CM | POA: Diagnosis not present

## 2021-12-24 DIAGNOSIS — I4819 Other persistent atrial fibrillation: Secondary | ICD-10-CM | POA: Diagnosis not present

## 2021-12-24 DIAGNOSIS — I110 Hypertensive heart disease with heart failure: Secondary | ICD-10-CM | POA: Diagnosis not present

## 2021-12-25 ENCOUNTER — Encounter (HOSPITAL_COMMUNITY): Payer: Self-pay | Admitting: Physician Assistant

## 2021-12-25 ENCOUNTER — Ambulatory Visit (HOSPITAL_COMMUNITY)
Admission: RE | Admit: 2021-12-25 | Discharge: 2021-12-25 | Disposition: A | Discharge status: Home / Self Care | Payer: Medicare Other | Source: Ambulatory Visit | Attending: Physician Assistant | Admitting: Physician Assistant

## 2021-12-25 ENCOUNTER — Other Ambulatory Visit: Payer: Self-pay

## 2021-12-25 VITALS — BP 96/42 | HR 51 | Ht 62.0 in | Wt 201.0 lb

## 2021-12-25 DIAGNOSIS — I4819 Other persistent atrial fibrillation: Secondary | ICD-10-CM

## 2021-12-25 DIAGNOSIS — E669 Obesity, unspecified: Secondary | ICD-10-CM | POA: Diagnosis not present

## 2021-12-25 DIAGNOSIS — E119 Type 2 diabetes mellitus without complications: Secondary | ICD-10-CM | POA: Diagnosis not present

## 2021-12-25 DIAGNOSIS — Z87891 Personal history of nicotine dependence: Secondary | ICD-10-CM | POA: Insufficient documentation

## 2021-12-25 DIAGNOSIS — J449 Chronic obstructive pulmonary disease, unspecified: Secondary | ICD-10-CM | POA: Diagnosis not present

## 2021-12-25 DIAGNOSIS — I5032 Chronic diastolic (congestive) heart failure: Secondary | ICD-10-CM | POA: Diagnosis not present

## 2021-12-25 DIAGNOSIS — Z7901 Long term (current) use of anticoagulants: Secondary | ICD-10-CM | POA: Insufficient documentation

## 2021-12-25 DIAGNOSIS — Z6836 Body mass index (BMI) 36.0-36.9, adult: Secondary | ICD-10-CM | POA: Diagnosis not present

## 2021-12-25 DIAGNOSIS — I110 Hypertensive heart disease with heart failure: Secondary | ICD-10-CM | POA: Insufficient documentation

## 2021-12-25 DIAGNOSIS — I2510 Atherosclerotic heart disease of native coronary artery without angina pectoris: Secondary | ICD-10-CM | POA: Insufficient documentation

## 2021-12-25 DIAGNOSIS — E785 Hyperlipidemia, unspecified: Secondary | ICD-10-CM | POA: Diagnosis not present

## 2021-12-25 DIAGNOSIS — D6869 Other thrombophilia: Secondary | ICD-10-CM | POA: Diagnosis not present

## 2021-12-25 NOTE — Patient Instructions (Signed)
STOP Amlodipine  Day of Cardioversion Come to Afib Clinic   Cardioversion scheduled for Monday January 30  - Arrive at the [facility] and go to admitting at 10am  - Do not eat or drink anything after midnight the night prior to your procedure.  - Take all your morning medication (except diabetic medications) with a sip of water prior to arrival.  - You will not be able to drive home after your procedure.  - Do NOT miss any doses of your blood thinner - if you should miss a dose please notify our office immediately.  - If you feel as if you go back into normal rhythm prior to scheduled cardioversion, please notify our office immediately. If your procedure is canceled in the cardioversion suite you will be charged a cancellation fee.  Patients will be asked to: to mask in public and hand hygiene (no longer quarantine) in the 3 days prior to surgery, to report if any COVID-19-like illness or household contacts to COVID-19 to determine need for testing

## 2021-12-25 NOTE — Progress Notes (Signed)
Primary Care Physician: Lavone Orn, MD Primary Cardiologist: Dr Angelena Form  Primary Electrophysiologist: none Referring Physician: Dr Edwyna Ready is a 88 y.o. female with a history of CAD, HTN, HLD, COPD, tobacco abuse, carotid artery disease, [place] 1 heart block, DM, and atrial fibrillation who presents for follow up in the [facility]. She was hospitalized in 08/2021 for bilateral lower extremity cellulitis and COPD exacerbation as well as acute on chronic diastolic CHF exacerbation.  2D echo at that time showed normal LV function with EF 60 to 65%, moderate pulmonary hypertension with PASP 65 mmHg and chest x-ray with vascular congestion.  She was diuresed with IV Lasix and transition to p.o. Lasix 40 mg daily. During that hospitalization she was found to be in atrial fibrillation and had not been anticoagulated.  Hospitalist decided not to anticoagulate because of high risk of bleeding with anticoagulation and DAPT.   She was seen by Dr Radford Pax on 10/11/21 and started on Eliquis for a CHADS2VASC score of 6. Zio monitor showed 100% afib burden with bradycardic/controlled rates.   On follow up today, patient was admitted 10/2021 for acute on chronic CHF. She is followed at the [facility]. She states that she continues to feel fatigued and becomes SOB with household work. She is in afib with slow V rate today.   Today, she denies symptoms of palpitations, chest pain, orthopnea, PND, lower extremity edema, dizziness, presyncope, syncope, snoring, daytime somnolence, bleeding, or neurologic sequela. The patient is tolerating medications without difficulties and is otherwise without complaint today.    Atrial Fibrillation Risk Factors:  she does not have symptoms or diagnosis of sleep apnea. she does not have a history of rheumatic fever. she does not have a history of alcohol use. The patient does not have a history of early familial atrial  fibrillation or other arrhythmias.  she has a BMI of Body mass index is 36.76 kg/m.Marland Kitchen Filed Weights   12/25/21 1000  Weight: 91.2 kg     Family History  Problem Relation Age of Onset   Hypertension Mother    Ulcers Mother    Stomach cancer Father    Stroke Maternal Grandmother    Heart attack Neg Hx      Atrial Fibrillation Management history:  Previous antiarrhythmic drugs: none Previous cardioversions: none Previous ablations: none CHADS2VASC score: 6 Anticoagulation history: Eliquis   Past Medical History:  Diagnosis Date   ACUT DUOD ULCER W/HEMORR&PERF W/O MENTION OBST 10/05/2009   NSAID induced   ALLERGIC RHINITIS CAUSE UNSPECIFIED    ANEMIA-NOS    CAD (coronary artery disease) 06/08/2009   DEs RCA with 70% LAD and EF 60%   CHF (congestive heart failure) ([place])    COPD    mild obst on PFTs 03/2010   Diabetes mellitus 06/2010 dx   Mild, diet controlled   GLAUCOMA    HYPERLIPIDEMIA    HYPERTENSION, BENIGN    MYOCARDIAL INFARCTION 06/08/2009   des to rca   Persistent atrial fibrillation ([place])    Dx 08/2021   TOBACCO ABUSE    Past Surgical History:  Procedure Laterality Date   HEMORRHOID SURGERY  1990   Right knee surgery      Current Outpatient Medications  Medication Sig Dispense Refill   albuterol (VENTOLIN HFA) 108 (90 Base) MCG/ACT inhaler INHALE ONE PUFF EVERY 6 HOURS AS NEEDED FOR SHORTNESS OF BREATH 18 g 2   amLODipine (NORVASC) 10 MG tablet Take 1 tablet (  10 mg total) by mouth daily. 90 tablet 3   apixaban (ELIQUIS) 5 MG TABS tablet Take 1 tablet (5 mg total) by mouth 2 (two) times daily. 60 tablet 11   dorzolamide (TRUSOPT) 2 % ophthalmic solution Place 1 drop into both eyes 2 (two) times daily.     famotidine (PEPCID) 20 MG tablet Take 20 mg by mouth 2 (two) times daily as needed for heartburn.     Fluticasone-Umeclidin-Vilant (TRELEGY ELLIPTA) 100-62.5-25 MCG/INH AEPB Inhale 1 puff into the lungs daily. 1 each 1   gabapentin (NEURONTIN) 100 MG  capsule Take 100-200 mg by mouth See admin instructions. Take 1 capsule (100mg ) by mouth every morning and take 2 capsules (200mg ) by mouth at bedtime     ipratropium-albuterol (DUONEB) 0.5-2.5 (3) MG/3ML SOLN Take 3 mLs by nebulization every 6 (six) hours as needed. 360 mL 1   latanoprost (XALATAN) 0.005 % ophthalmic solution Place 1 drop into both eyes in the morning.     magnesium hydroxide (MILK OF MAGNESIA) 400 MG/5ML suspension Take 30 mLs by mouth as needed for mild constipation.     NITROSTAT 0.4 MG SL tablet DISSOLVE ONE TABLET UNDER TONGUE AS NEEDED FOR CHEST PAIN - MAY REPEAT TWICE-IF NO RELIEF GO TO NEAREST HOSPITAL ER 25 tablet 0   rosuvastatin (CRESTOR) 20 MG tablet Take 20 mg by mouth daily.     sacubitril-valsartan (ENTRESTO) 24-26 MG Take 1 tablet by mouth 2 (two) times daily. 60 tablet 3   torsemide 40 MG TABS Take 40 mg by mouth daily. And additional 20mg  if needed for weight gain, shortness of breath or swelling 40 tablet 5   No current facility-administered medications for this encounter.    Allergies  Allergen Reactions   Cyclobenzaprine     Other reaction(s): muscle relaxers-ulcer hemorrhage (hospitalized)   Lactose Intolerance (Gi)    Propoxyphene     Other reaction(s): pain meds, hallucination, vomiting   Wound Dressing Adhesive     Other reaction(s): red, stings   Atorvastatin Itching    Itching and myaliga   Meperidine Hcl Other (See Comments)    Not known   Pravastatin Rash    rash    Social History   Socioeconomic History   Marital status: Widowed    Spouse name: Not on file   Number of children: Not on file   Years of education: Not on file   Highest education level: Not on file  Occupational History   Not on file  Tobacco Use   Smoking status: Former    Packs/day: 0.50    Years: 60.00    Pack years: 30.00    Types: Cigarettes    Quit date: 11/12/2021    Years since quitting: 0.1   Smokeless tobacco: Never   Tobacco comments:    She lives  in [place] with her significant other (Charles Hook)0  Vaping Use   Vaping Use: Never used  Substance and Sexual Activity   Alcohol use: Yes    Alcohol/week: 1.0 - 2.0 standard drink    Types: 1 - 2 Glasses of wine per week    Comment: once or twice a year glass of wine 12/25/21   Drug use: No   Sexual activity: Not on file  Other Topics Concern   Not on file  Social History Narrative   Lives in [place] with her SO - charles hook   Retired -Catering manager   Enjoys travel - live Photographer   Social Determinants of Health  Financial Resource Strain: Not on file  Food Insecurity: Not on file  Transportation Needs: Not on file  Physical Activity: Not on file  Stress: Not on file  Social Connections: Not on file  Intimate Partner Violence: Not on file     ROS- All systems are reviewed and negative except as per the HPI above.  Physical Exam: Vitals:   12/25/21 1000  BP: (!) 96/42  Pulse: (!) 51  Weight: 91.2 kg  Height: 5\' 2"  (1.575 m)    GEN- The patient is a well appearing elderly obese female, alert and oriented x 3 today.   HEENT-head normocephalic, atraumatic, sclera clear, conjunctiva pink, hearing intact, trachea midline. Lungs- Clear to ausculation bilaterally, normal work of breathing Heart- irregular rate and rhythm, bradycardia, no murmurs, rubs or gallops  GI- soft, NT, ND, + BS Extremities- no clubbing, cyanosis, or edema MS- no significant deformity or atrophy Skin- no rash or lesion Psych- euthymic mood, full affect Neuro- strength and sensation are intact   Wt Readings from Last 3 Encounters:  12/25/21 91.2 kg  12/12/21 89.5 kg  11/12/21 89.4 kg    EKG today demonstrates  Afib with slow V rate Vent. rate 51 BPM PR interval * ms QRS duration 62 ms QT/QTcB 450/414 ms  Echo 08/22/21 demonstrated   1. Left ventricular ejection fraction, by estimation, is 60 to 65%. The left ventricle has normal function. The left ventricle has no  regional wall motion abnormalities. There is mild left ventricular hypertrophy. Left ventricular diastolic parameters are indeterminate.   2. Right ventricular systolic function is normal. The right ventricular  size is normal. There is severely elevated pulmonary artery systolic  pressure. The estimated right ventricular systolic pressure is 0000000 mmHg.   3. Left atrial size was mildly dilated.   4. Right atrial size was moderately dilated.   5. The mitral valve is normal in structure. Mild mitral valve  regurgitation. Mild mitral stenosis.   6. The inferior vena cava is dilated in size with <50% respiratory  variability, suggesting right atrial pressure of 15 mmHg.   Epic records are reviewed at length today  CHA2DS2-VASc Score = 6  The patient's score is based upon: CHF History: 0 HTN History: 1 Diabetes History: 1 Stroke History: 0 Vascular Disease History: 1 Age Score: 2 Gender Score: 1        ASSESSMENT AND PLAN: 1. Persistent Atrial Fibrillation (ICD10:  I48.19) The patient's CHA2DS2-VASc score is 6, indicating a 9.7% annual risk of stroke.   We discussed rate vs rhythm control today. I explained that afib can definitely contribute to her fluid retention. After discussing the risks and benefits, will plan for DCCV.  Her bradycardia and baseline conduction disease limit medication options.  Continue Eliquis 5 mg BID. Patient denies any missed doses.   2. Secondary Hypercoagulable State (ICD10:  D68.69) The patient is at significant risk for stroke/thromboembolism based upon her CHA2DS2-VASc Score of 6.  Continue Apixaban (Eliquis).   3. Obesity Body mass index is 36.76 kg/m. Lifestyle modification was discussed and encouraged including regular physical activity and weight reduction.  4. HTN Low today. Will stop amlodipine.   5. CAD No anginal symptoms.  6. Chronic diastolic CHF No overt fluid overload. Followed in [facility].  Hopefully this will improve with  SR.   Follow up in the AF clinic post DCCV.    [facility] [address] [place], [place] [address] [phone] 12/25/2021 10:08 AM

## 2021-12-26 ENCOUNTER — Other Ambulatory Visit: Payer: Self-pay | Admitting: Family

## 2021-12-26 DIAGNOSIS — Z7901 Long term (current) use of anticoagulants: Secondary | ICD-10-CM | POA: Diagnosis not present

## 2021-12-26 DIAGNOSIS — F17210 Nicotine dependence, cigarettes, uncomplicated: Secondary | ICD-10-CM | POA: Diagnosis not present

## 2021-12-26 DIAGNOSIS — E785 Hyperlipidemia, unspecified: Secondary | ICD-10-CM | POA: Diagnosis not present

## 2021-12-26 DIAGNOSIS — I4819 Other persistent atrial fibrillation: Secondary | ICD-10-CM | POA: Diagnosis not present

## 2021-12-26 DIAGNOSIS — J449 Chronic obstructive pulmonary disease, unspecified: Secondary | ICD-10-CM | POA: Diagnosis not present

## 2021-12-26 DIAGNOSIS — H409 Unspecified glaucoma: Secondary | ICD-10-CM | POA: Diagnosis not present

## 2021-12-26 DIAGNOSIS — I2510 Atherosclerotic heart disease of native coronary artery without angina pectoris: Secondary | ICD-10-CM | POA: Diagnosis not present

## 2021-12-26 DIAGNOSIS — I5033 Acute on chronic diastolic (congestive) heart failure: Secondary | ICD-10-CM | POA: Diagnosis not present

## 2021-12-26 DIAGNOSIS — I872 Venous insufficiency (chronic) (peripheral): Secondary | ICD-10-CM | POA: Diagnosis not present

## 2021-12-26 DIAGNOSIS — I6523 Occlusion and stenosis of bilateral carotid arteries: Secondary | ICD-10-CM | POA: Diagnosis not present

## 2021-12-26 DIAGNOSIS — E669 Obesity, unspecified: Secondary | ICD-10-CM | POA: Diagnosis not present

## 2021-12-26 DIAGNOSIS — I110 Hypertensive heart disease with heart failure: Secondary | ICD-10-CM | POA: Diagnosis not present

## 2021-12-26 DIAGNOSIS — Z7982 Long term (current) use of aspirin: Secondary | ICD-10-CM | POA: Diagnosis not present

## 2021-12-26 DIAGNOSIS — E1151 Type 2 diabetes mellitus with diabetic peripheral angiopathy without gangrene: Secondary | ICD-10-CM | POA: Diagnosis not present

## 2021-12-27 ENCOUNTER — Encounter (HOSPITAL_COMMUNITY): Payer: Self-pay | Admitting: Cardiology

## 2021-12-27 ENCOUNTER — Telehealth: Payer: Self-pay | Admitting: Family

## 2021-12-27 DIAGNOSIS — I5032 Chronic diastolic (congestive) heart failure: Secondary | ICD-10-CM

## 2021-12-27 NOTE — Telephone Encounter (Signed)
Patient continues to have fluctuations in her weight, swelling and symptoms. Kidney function has declined some. Discussed nephrology referral to aid in diuretic adjusting and patient is agreeable to getting established. Referral placed to [facility]

## 2021-12-28 ENCOUNTER — Telehealth (HOSPITAL_COMMUNITY): Payer: Self-pay | Admitting: *Deleted

## 2021-12-28 ENCOUNTER — Other Ambulatory Visit (HOSPITAL_COMMUNITY): Payer: Self-pay | Admitting: *Deleted

## 2021-12-28 NOTE — Telephone Encounter (Signed)
Patient called in stating she missed her dose of Eliquis on 1/19 PM by mistake. Cardioversion rescheduled 3 weeks out. New instructions given. Pt in agreement.

## 2022-01-02 DIAGNOSIS — I2510 Atherosclerotic heart disease of native coronary artery without angina pectoris: Secondary | ICD-10-CM | POA: Diagnosis not present

## 2022-01-02 DIAGNOSIS — J449 Chronic obstructive pulmonary disease, unspecified: Secondary | ICD-10-CM | POA: Diagnosis not present

## 2022-01-02 DIAGNOSIS — I110 Hypertensive heart disease with heart failure: Secondary | ICD-10-CM | POA: Diagnosis not present

## 2022-01-02 DIAGNOSIS — E1151 Type 2 diabetes mellitus with diabetic peripheral angiopathy without gangrene: Secondary | ICD-10-CM | POA: Diagnosis not present

## 2022-01-02 DIAGNOSIS — I5033 Acute on chronic diastolic (congestive) heart failure: Secondary | ICD-10-CM | POA: Diagnosis not present

## 2022-01-02 DIAGNOSIS — I4819 Other persistent atrial fibrillation: Secondary | ICD-10-CM | POA: Diagnosis not present

## 2022-01-03 ENCOUNTER — Emergency Department: Payer: Medicare Other

## 2022-01-03 ENCOUNTER — Other Ambulatory Visit: Payer: Self-pay

## 2022-01-03 ENCOUNTER — Emergency Department
Admission: EM | Admit: 2022-01-03 | Discharge: 2022-01-03 | Disposition: A | Discharge status: Home / Self Care | Payer: Medicare Other | Attending: Emergency Medicine | Admitting: Emergency Medicine

## 2022-01-03 DIAGNOSIS — R0902 Hypoxemia: Secondary | ICD-10-CM | POA: Diagnosis not present

## 2022-01-03 DIAGNOSIS — E119 Type 2 diabetes mellitus without complications: Secondary | ICD-10-CM | POA: Diagnosis not present

## 2022-01-03 DIAGNOSIS — J449 Chronic obstructive pulmonary disease, unspecified: Secondary | ICD-10-CM | POA: Diagnosis not present

## 2022-01-03 DIAGNOSIS — I4891 Unspecified atrial fibrillation: Secondary | ICD-10-CM | POA: Diagnosis not present

## 2022-01-03 DIAGNOSIS — R197 Diarrhea, unspecified: Secondary | ICD-10-CM | POA: Diagnosis not present

## 2022-01-03 DIAGNOSIS — I2510 Atherosclerotic heart disease of native coronary artery without angina pectoris: Secondary | ICD-10-CM | POA: Insufficient documentation

## 2022-01-03 DIAGNOSIS — I509 Heart failure, unspecified: Secondary | ICD-10-CM | POA: Insufficient documentation

## 2022-01-03 DIAGNOSIS — U071 COVID-19: Secondary | ICD-10-CM | POA: Diagnosis not present

## 2022-01-03 DIAGNOSIS — R944 Abnormal results of kidney function studies: Secondary | ICD-10-CM | POA: Insufficient documentation

## 2022-01-03 DIAGNOSIS — I517 Cardiomegaly: Secondary | ICD-10-CM | POA: Diagnosis not present

## 2022-01-03 LAB — LIPASE, BLOOD: Lipase: 22 U/L (ref 11–51)

## 2022-01-03 LAB — COMPREHENSIVE METABOLIC PANEL
ALT: 24 U/L (ref 0–44)
AST: 35 U/L (ref 15–41)
Albumin: 3.6 g/dL (ref 3.5–5.0)
Alkaline Phosphatase: 115 U/L (ref 38–126)
Anion gap: 13 (ref 5–15)
BUN: 48 mg/dL — ABNORMAL HIGH (ref 8–23)
CO2: 24 mmol/L (ref 22–32)
Calcium: 8.5 mg/dL — ABNORMAL LOW (ref 8.9–10.3)
Chloride: 100 mmol/L (ref 98–111)
Creatinine, Ser: 1.18 mg/dL — ABNORMAL HIGH (ref 0.44–1.00)
GFR, Estimated: 45 mL/min — ABNORMAL LOW (ref >60)
Glucose, Bld: 145 mg/dL — ABNORMAL HIGH (ref 70–99)
Potassium: 3.1 mmol/L — ABNORMAL LOW (ref 3.5–5.1)
Sodium: 137 mmol/L (ref 135–145)
Total Bilirubin: 0.5 mg/dL (ref 0.3–1.2)
Total Protein: 7.2 g/dL (ref 6.5–8.1)

## 2022-01-03 LAB — RESP PANEL BY RT-PCR (FLU A&B, COVID) ARPGX2
Influenza A by PCR: NEGATIVE
Influenza B by PCR: NEGATIVE
SARS Coronavirus 2 by RT PCR: POSITIVE — AB

## 2022-01-03 LAB — CBC
HCT: 42.8 % (ref 36.0–46.0)
Hemoglobin: 13.8 g/dL (ref 12.0–15.0)
MCH: 31.7 pg (ref 26.0–34.0)
MCHC: 32.2 g/dL (ref 30.0–36.0)
MCV: 98.2 fL (ref 80.0–100.0)
Platelets: 215 10*3/uL (ref 150–400)
RBC: 4.36 MIL/uL (ref 3.87–5.11)
RDW: 14.2 % (ref 11.5–15.5)
WBC: 3.4 10*3/uL — ABNORMAL LOW (ref 4.0–10.5)
nRBC: 0 % (ref 0.0–0.2)

## 2022-01-03 LAB — MAGNESIUM: Magnesium: 2.2 mg/dL (ref 1.7–2.4)

## 2022-01-03 MED ORDER — SODIUM CHLORIDE 0.9 % IV BOLUS
500.0000 mL | Freq: Once | INTRAVENOUS | Status: AC
  Administered 2022-01-03: 500 mL via INTRAVENOUS

## 2022-01-03 MED ORDER — SODIUM CHLORIDE 0.9 % IV SOLN
1000.0000 mL | Freq: Once | INTRAVENOUS | Status: AC
Start: 2022-01-03 — End: 2022-01-03
  Administered 2022-01-03: 1000 mL via INTRAVENOUS

## 2022-01-03 NOTE — ED Triage Notes (Signed)
Pt c/o diarrhea over the past 5 days, loss of 10lbs in the past week. Pt c/o SOB, loss of appetite, weakness.

## 2022-01-03 NOTE — ED Notes (Signed)
Pt ambulatory to restroom with Jenny [name] 

## 2022-01-03 NOTE — ED Provider Notes (Signed)
[facility] Provider Note    Event Date/Time   First MD Initiated Contact with Patient 01/03/22 1342     (approximate)   History   Diarrhea   HPI  Jenny [name] is a 88 y.o. female with a history of CAD, CHF, COPD, diabetes who presents with complaints of diarrhea.  Patient reports she has had diarrhea for 5 days, reports significant weight loss, she notes that she is also had a change in her taste, she is suspicious that she may have COVID.  She has been feeling sick for nearly a week.  She does have a cough but denies shortness of breath.       Physical Exam   Triage Vital Signs: ED Triage Vitals [01/03/22 1236]  Enc Vitals Group     BP 115/60     Pulse Rate (!) 56     Resp 16     Temp 97.9 F (36.6 C)     Temp Source Oral     SpO2 91 %     Weight      Height      Head Circumference      Peak Flow      Pain Score      Pain Loc      Pain Edu?      Excl. in GC?     Most recent vital signs: Vitals:   01/03/22 1236 01/03/22 1400  BP: 115/60 (!) 133/51  Pulse: (!) 56 (!) 49  Resp: 16 14  Temp: 97.9 F (36.6 C)   SpO2: 91% 100%     General: Awake, no distress.  CV:  Good peripheral perfusion.  Resp:  Normal effort.  Occasional cough Abd:  No distention.  Diarrhea as above, watery Other:     ED Results / Procedures / Treatments   Labs (all labs ordered are listed, but only abnormal results are displayed) Labs Reviewed  RESP PANEL BY RT-PCR (FLU A&B, COVID) ARPGX2 - Abnormal; Notable for the following components:      Result Value   SARS Coronavirus 2 by RT PCR POSITIVE (*)    All other components within normal limits  COMPREHENSIVE METABOLIC PANEL - Abnormal; Notable for the following components:   Potassium 3.1 (*)    Glucose, Bld 145 (*)    BUN 48 (*)    Creatinine, Ser 1.18 (*)    Calcium 8.5 (*)    GFR, Estimated 45 (*)    All other components within normal limits  CBC - Abnormal; Notable for the following  components:   WBC 3.4 (*)    All other components within normal limits  LIPASE, BLOOD  URINALYSIS, ROUTINE W REFLEX MICROSCOPIC  MAGNESIUM     EKG  ED ECG REPORT I, Jene Every, the attending physician, personally viewed and interpreted this ECG.  Date: 01/03/2022  Rhythm: Atrial fibrillation QRS Axis: normal Intervals: Abnormal ST/T Wave abnormalities: normal Narrative Interpretation: no evidence of acute ischemia    RADIOLOGY Chest x-ray viewed by me, no evidence of pneumonia    PROCEDURES:  Critical Care performed:   Procedures   MEDICATIONS ORDERED IN ED: Medications  0.9 %  sodium chloride infusion (1,000 mLs Intravenous New Bag/Given 01/03/22 1352)     IMPRESSION / MDM / ASSESSMENT AND PLAN / ED COURSE  I reviewed the triage vital signs and the nursing notes.  Patient presents with diarrhea as noted above, she has had some weight loss.  Denies abdominal pain.  Has had loss of taste and smell and indeed her COVID test has returned as positive.  This is likely the cause of her diarrhea as well.  She feels like her breathing is at baseline is having occasional cough, has a history of COPD and CHF.  Chest x-ray is reassuring, no evidence of pneumonia.  She is outside the window of treatment for paxlovid  Will treat with IV fluids given evidence of dehydration on her CMP with BUN of 48 creatinine of 1.18, CBC is reassuring  Patient does live alone, consideration for admission given little help at home however she does report she has a home health nurse that does come   ----------------------------------------- 2:54 PM on 01/03/2022 ----------------------------------------- Reevaluated patient, will give additional IV fluids, then ambulatory pulse ox to determine whether she is able to go home.         FINAL CLINICAL IMPRESSION(S) / ED DIAGNOSES   Final diagnoses:  Diarrhea, unspecified type  COVID-19     Rx / DC Orders   ED Discharge Orders      None        Note:  This document was prepared using Dragon voice recognition software and may include unintentional dictation errors.   Jene Every, MD 01/03/22 1455

## 2022-01-07 ENCOUNTER — Other Ambulatory Visit (HOSPITAL_COMMUNITY): Payer: Medicare Other | Admitting: Physician Assistant

## 2022-01-07 DIAGNOSIS — E1151 Type 2 diabetes mellitus with diabetic peripheral angiopathy without gangrene: Secondary | ICD-10-CM | POA: Diagnosis not present

## 2022-01-07 DIAGNOSIS — I110 Hypertensive heart disease with heart failure: Secondary | ICD-10-CM | POA: Diagnosis not present

## 2022-01-07 DIAGNOSIS — I5033 Acute on chronic diastolic (congestive) heart failure: Secondary | ICD-10-CM | POA: Diagnosis not present

## 2022-01-07 DIAGNOSIS — J449 Chronic obstructive pulmonary disease, unspecified: Secondary | ICD-10-CM | POA: Diagnosis not present

## 2022-01-07 DIAGNOSIS — I4819 Other persistent atrial fibrillation: Secondary | ICD-10-CM | POA: Diagnosis not present

## 2022-01-07 DIAGNOSIS — I2510 Atherosclerotic heart disease of native coronary artery without angina pectoris: Secondary | ICD-10-CM | POA: Diagnosis not present

## 2022-01-09 ENCOUNTER — Other Ambulatory Visit (HOSPITAL_COMMUNITY): Payer: Self-pay | Admitting: *Deleted

## 2022-01-09 ENCOUNTER — Telehealth (HOSPITAL_COMMUNITY): Payer: Self-pay | Admitting: *Deleted

## 2022-01-09 NOTE — Telephone Encounter (Signed)
Patient called missed dose of eliquis 1/26 PM dose. DCCV rescheduled to 2/22. Pt instructions reviewed.

## 2022-01-11 DIAGNOSIS — K219 Gastro-esophageal reflux disease without esophagitis: Secondary | ICD-10-CM | POA: Diagnosis not present

## 2022-01-11 DIAGNOSIS — E1142 Type 2 diabetes mellitus with diabetic polyneuropathy: Secondary | ICD-10-CM | POA: Diagnosis not present

## 2022-01-11 DIAGNOSIS — I872 Venous insufficiency (chronic) (peripheral): Secondary | ICD-10-CM | POA: Diagnosis not present

## 2022-01-11 DIAGNOSIS — B356 Tinea cruris: Secondary | ICD-10-CM | POA: Diagnosis not present

## 2022-01-11 DIAGNOSIS — E1151 Type 2 diabetes mellitus with diabetic peripheral angiopathy without gangrene: Secondary | ICD-10-CM | POA: Diagnosis not present

## 2022-01-11 DIAGNOSIS — B372 Candidiasis of skin and nail: Secondary | ICD-10-CM | POA: Diagnosis not present

## 2022-01-11 DIAGNOSIS — I5030 Unspecified diastolic (congestive) heart failure: Secondary | ICD-10-CM | POA: Diagnosis not present

## 2022-01-11 DIAGNOSIS — E785 Hyperlipidemia, unspecified: Secondary | ICD-10-CM | POA: Diagnosis not present

## 2022-01-11 DIAGNOSIS — I4819 Other persistent atrial fibrillation: Secondary | ICD-10-CM | POA: Diagnosis not present

## 2022-01-11 DIAGNOSIS — J449 Chronic obstructive pulmonary disease, unspecified: Secondary | ICD-10-CM | POA: Diagnosis not present

## 2022-01-14 ENCOUNTER — Ambulatory Visit (HOSPITAL_BASED_OUTPATIENT_CLINIC_OR_DEPARTMENT_OTHER): Payer: Medicare Other | Admitting: Family

## 2022-01-14 ENCOUNTER — Telehealth: Payer: Self-pay | Admitting: Family

## 2022-01-14 ENCOUNTER — Other Ambulatory Visit
Admission: RE | Admit: 2022-01-14 | Discharge: 2022-01-14 | Disposition: A | Discharge status: Home / Self Care | Payer: Medicare Other | Source: Ambulatory Visit | Attending: Family | Admitting: Family

## 2022-01-14 ENCOUNTER — Encounter: Payer: Self-pay | Admitting: Family

## 2022-01-14 ENCOUNTER — Other Ambulatory Visit: Payer: Self-pay

## 2022-01-14 VITALS — BP 146/113 | HR 50 | Resp 18 | Ht 62.0 in | Wt 194.0 lb

## 2022-01-14 DIAGNOSIS — R609 Edema, unspecified: Secondary | ICD-10-CM | POA: Insufficient documentation

## 2022-01-14 DIAGNOSIS — Z8616 Personal history of COVID-19: Secondary | ICD-10-CM | POA: Insufficient documentation

## 2022-01-14 DIAGNOSIS — Z09 Encounter for follow-up examination after completed treatment for conditions other than malignant neoplasm: Secondary | ICD-10-CM | POA: Insufficient documentation

## 2022-01-14 DIAGNOSIS — I4819 Other persistent atrial fibrillation: Secondary | ICD-10-CM | POA: Insufficient documentation

## 2022-01-14 DIAGNOSIS — H409 Unspecified glaucoma: Secondary | ICD-10-CM | POA: Insufficient documentation

## 2022-01-14 DIAGNOSIS — E785 Hyperlipidemia, unspecified: Secondary | ICD-10-CM | POA: Insufficient documentation

## 2022-01-14 DIAGNOSIS — I2510 Atherosclerotic heart disease of native coronary artery without angina pectoris: Secondary | ICD-10-CM | POA: Insufficient documentation

## 2022-01-14 DIAGNOSIS — Z720 Tobacco use: Secondary | ICD-10-CM

## 2022-01-14 DIAGNOSIS — I130 Hypertensive heart and chronic kidney disease with heart failure and stage 1 through stage 4 chronic kidney disease, or unspecified chronic kidney disease: Secondary | ICD-10-CM | POA: Insufficient documentation

## 2022-01-14 DIAGNOSIS — D649 Anemia, unspecified: Secondary | ICD-10-CM | POA: Insufficient documentation

## 2022-01-14 DIAGNOSIS — Z79899 Other long term (current) drug therapy: Secondary | ICD-10-CM | POA: Insufficient documentation

## 2022-01-14 DIAGNOSIS — R0602 Shortness of breath: Secondary | ICD-10-CM | POA: Insufficient documentation

## 2022-01-14 DIAGNOSIS — I10 Essential (primary) hypertension: Secondary | ICD-10-CM | POA: Diagnosis not present

## 2022-01-14 DIAGNOSIS — Z87891 Personal history of nicotine dependence: Secondary | ICD-10-CM | POA: Diagnosis not present

## 2022-01-14 DIAGNOSIS — I5032 Chronic diastolic (congestive) heart failure: Secondary | ICD-10-CM | POA: Insufficient documentation

## 2022-01-14 DIAGNOSIS — E1122 Type 2 diabetes mellitus with diabetic chronic kidney disease: Secondary | ICD-10-CM | POA: Diagnosis not present

## 2022-01-14 DIAGNOSIS — N183 Chronic kidney disease, stage 3 unspecified: Secondary | ICD-10-CM

## 2022-01-14 DIAGNOSIS — N1831 Chronic kidney disease, stage 3a: Secondary | ICD-10-CM | POA: Diagnosis not present

## 2022-01-14 DIAGNOSIS — R002 Palpitations: Secondary | ICD-10-CM | POA: Insufficient documentation

## 2022-01-14 DIAGNOSIS — R5383 Other fatigue: Secondary | ICD-10-CM | POA: Insufficient documentation

## 2022-01-14 DIAGNOSIS — J449 Chronic obstructive pulmonary disease, unspecified: Secondary | ICD-10-CM | POA: Insufficient documentation

## 2022-01-14 LAB — BASIC METABOLIC PANEL
Anion gap: 9 (ref 5–15)
BUN: 36 mg/dL — ABNORMAL HIGH (ref 8–23)
CO2: 29 mmol/L (ref 22–32)
Calcium: 8.2 mg/dL — ABNORMAL LOW (ref 8.9–10.3)
Chloride: 98 mmol/L (ref 98–111)
Creatinine, Ser: 1.36 mg/dL — ABNORMAL HIGH (ref 0.44–1.00)
GFR, Estimated: 38 mL/min — ABNORMAL LOW (ref >60)
Glucose, Bld: 110 mg/dL — ABNORMAL HIGH (ref 70–99)
Potassium: 4.1 mmol/L (ref 3.5–5.1)
Sodium: 136 mmol/L (ref 135–145)

## 2022-01-14 NOTE — Progress Notes (Signed)
Patient ID: Jenny [name], female    DOB: December 26, 1932, 88 y.o.   MRN: [id number]   Jenny [name] is a 88 y/o female with a history of CAD, hyperlipidemia, HTN, anemia, COPD, glaucoma, atrial fibrillation, recent tobacco use and chronic heart failure.   Echo report from 08/22/21 reviewed and showed an EF of 60-65% along with mild LVH/ LAE, mild MR and severely elevated PA pressure of 65.1 mmHg.   ED visit on 01/03/22 for diarrhea. Found to be covid +. Treated with IV fluids and discharged back home.  Admitted 11/03/21 due to acute on chronic heart failure. Needed oxygen at 3L but then able to be weaned down to room air. Initially given IV lasix with transition to oral diuretics. PT evaluation done. Discharged after 3 days.   She presents today for a follow-up visit with a chief complaint of minimal fatigue upon moderate exertion. She describes this as chronic in nature having been present for several years. She has associated shortness of breath, pedal edema, occasional palpitations and difficulty sleeping along with this. She denies any dizziness, abdominal distention, chest pain or cough.   Weighing daily. HH RN still visiting. Recently changed PCP > [facility], they saw her in her home on 01/11/22.   Past Medical History:  Diagnosis Date   ACUT DUOD ULCER W/HEMORR&PERF W/O MENTION OBST 10/05/2009   NSAID induced   ALLERGIC RHINITIS CAUSE UNSPECIFIED    ANEMIA-NOS    CAD (coronary artery disease) 06/08/2009   DEs RCA with 70% LAD and EF 60%   CHF (congestive heart failure) (HCC)    COPD    mild obst on PFTs 03/2010   Diabetes mellitus 06/2010 dx   Mild, diet controlled   GLAUCOMA    HYPERLIPIDEMIA    HYPERTENSION, BENIGN    MYOCARDIAL INFARCTION 06/08/2009   des to rca   Persistent atrial fibrillation ([place])    Dx 08/2021   TOBACCO ABUSE    Past Surgical History:  Procedure Laterality Date   HEMORRHOID SURGERY  1990   Right knee surgery     Family History  Problem Relation Age of  Onset   Hypertension Mother    Ulcers Mother    Stomach cancer Father    Stroke Maternal Grandmother    Heart attack Neg Hx    Social History   Tobacco Use   Smoking status: Former    Packs/day: 0.50    Years: 60.00    Pack years: 30.00    Types: Cigarettes    Quit date: 11/12/2021    Years since quitting: 0.1   Smokeless tobacco: Never   Tobacco comments:    She lives in [place] with her significant other (Jenny [name])0  Substance Use Topics   Alcohol use: Yes    Alcohol/week: 1.0 - 2.0 standard drink    Types: 1 - 2 Glasses of wine per week    Comment: once or twice a year glass of wine 12/25/21   Allergies  Allergen Reactions   Cyclobenzaprine     Other reaction(s): muscle relaxers-ulcer hemorrhage (hospitalized)   Lactose Intolerance (Gi)    Propoxyphene     Other reaction(s): pain meds, hallucination, vomiting   Wound Dressing Adhesive     Other reaction(s): red, stings   Atorvastatin Itching    Itching and myaliga   Meperidine Hcl Other (See Comments)    Not known   Pravastatin Rash    rash   Prior to Admission medications   Medication Sig Start  Date End Date Taking? Authorizing Provider  albuterol (VENTOLIN HFA) 108 (90 Base) MCG/ACT inhaler INHALE ONE PUFF EVERY 6 HOURS AS NEEDED FOR SHORTNESS OF BREATH 11/03/21  Yes Lucrezia Starch, MD  amLODipine (NORVASC) 10 MG tablet Take 1 tablet (10 mg total) by mouth daily. 10/17/21  Yes Burnell Blanks, MD  apixaban (ELIQUIS) 5 MG TABS tablet Take 1 tablet (5 mg total) by mouth 2 (two) times daily. 10/11/21  Yes Turner, Eber Hong, MD  dorzolamide (TRUSOPT) 2 % ophthalmic solution Place 1 drop into both eyes 2 (two) times daily.   Yes [provider]  famotidine (PEPCID) 20 MG tablet Take 20 mg by mouth 2 (two) times daily as needed for heartburn.   Yes [provider]  Fluticasone-Umeclidin-Vilant (TRELEGY ELLIPTA) 100-62.5-25 MCG/INH AEPB Inhale 1 puff into the lungs daily. 08/27/21  Yes Mercy Riding, MD  furosemide (LASIX) 20 MG tablet Take 40 mg on 11/30 and then from 11/08/21 take 20 mg daily. Take additional 20 mg as needed on days when you feel sob or have increased leg edema 11/06/21  Yes Fritzi Mandes, MD  gabapentin (NEURONTIN) 100 MG capsule Take 100-200 mg by mouth See admin instructions. Take 1 capsule (100mg ) by mouth every morning and take 2 capsules (200mg ) by mouth at bedtime   Yes [provider]  ipratropium-albuterol (DUONEB) 0.5-2.5 (3) MG/3ML SOLN Take 3 mLs by nebulization every 6 (six) hours as needed. 11/06/21  Yes Fritzi Mandes, MD  latanoprost (XALATAN) 0.005 % ophthalmic solution Place 1 drop into both eyes in the morning.   Yes [provider]  losartan (COZAAR) 50 MG tablet Take 1 tablet (50 mg total) by mouth in the morning and at bedtime. 10/23/21  Yes [provider]  magnesium hydroxide (MILK OF MAGNESIA) 400 MG/5ML suspension Take 30 mLs by mouth as needed for mild constipation.   Yes [provider]  NITROSTAT 0.4 MG SL tablet DISSOLVE ONE TABLET UNDER TONGUE AS NEEDED FOR CHEST PAIN - MAY REPEAT TWICE-IF NO RELIEF GO TO NEAREST HOSPITAL ER 05/06/13  Yes Rowe Clack, MD  potassium chloride (KLOR-CON) 10 MEQ tablet Take 10 mEq by mouth daily as needed (supplementation with furosemide).   Yes [provider]  rosuvastatin (CRESTOR) 20 MG tablet Take 20 mg by mouth daily.   Yes [provider]   Review of Systems  Constitutional:  Positive for fatigue. Negative for appetite change.  HENT:  Negative for congestion, postnasal drip and sore throat.   Eyes: Negative.   Respiratory:  Positive for shortness of breath. Negative for cough and chest tightness.   Cardiovascular:  Positive for palpitations (sometimes). Negative for chest pain and leg swelling.  Gastrointestinal:  Negative for abdominal distention and abdominal pain.  Endocrine: Negative.   Genitourinary: Negative.   Musculoskeletal:  Negative for  back pain and neck pain.  Skin: Negative.   Allergic/Immunologic: Negative.   Neurological:  Negative for dizziness and light-headedness.  Hematological:  Negative for adenopathy. Bruises/bleeds easily.  Psychiatric/Behavioral:  Positive for sleep disturbance (chronic issue; sleeping sitting on sofa). Negative for dysphoric mood. The patient is not nervous/anxious.    Vitals:   01/14/22 1151  BP: (!) 146/113  Pulse: (!) 50  Resp: 18  SpO2: 100%  Weight: 194 lb (88 kg)  Height: 5\' 2"  (1.575 m)   Wt Readings from Last 3 Encounters:  01/14/22 194 lb (88 kg)  12/25/21 201 lb (91.2 kg)  12/12/21 197 lb 4 oz (89.5  kg)   Lab Results  Component Value Date   CREATININE 1.18 (H) 01/03/2022   CREATININE 1.43 (H) 12/12/2021   CREATININE 1.00 12/07/2021   Physical Exam Vitals and nursing note reviewed. Exam conducted with a chaperone present (son).  Constitutional:      General: She is not in acute distress.    Appearance: Normal appearance.  HENT:     Head: Normocephalic.  Cardiovascular:     Rate and Rhythm: Bradycardia present. Rhythm irregular.  Pulmonary:     Effort: Pulmonary effort is normal. No respiratory distress.     Breath sounds: No wheezing or rales.  Abdominal:     General: There is no distension.     Palpations: Abdomen is soft.     Tenderness: There is no abdominal tenderness.  Musculoskeletal:        General: No tenderness.     Cervical back: Normal range of motion and neck supple.     Right lower leg: No edema.     Left lower leg: No edema.  Skin:    General: Skin is warm and dry.  Neurological:     General: No focal deficit present.     Mental Status: She is alert and oriented to person, place, and time.  Psychiatric:        Mood and Affect: Mood normal.        Behavior: Behavior normal.        Thought Content: Thought content normal.    Assessment & Plan: 1: Chronic heart failure with preserved ejection fraction with structural changes (LVH/LAE)- -  NYHA class II - euvolemic today - weighing daily; reminded to call for an overnight weight gain of > 2 pounds or a weekly weight gain of > 5 pounds - weight down 3 lbs from last visit a month ago - not adding salt to her food and tries to eat low sodium foods  - entresto started at last visit - will check BMP today  - has weekly home health nursing ad PT coming - BNP 12/07/21 was 371.3  2: HTN- - BP 146/113, rechecked 126/52 - newly established with [facility], first in home visit on 01/11/22 - BMP 01/03/22 reviewed and showed sodium 137, potassium 4.13, creatinine 1.18 and GFR 45  3: Persistent atrial fibrillation- - saw cardiology Marlene Lard) 12/15/21 - irregular RR today  - cardioversion planned 01/30/22  4: CKD stage 3a- - referred to nephrologist to get established  5: Tobacco use- - stopped smoking 2 months ago   Medication bottles reviewed.   Return in 3 months or sooner for any questions/problems before then.   Marland Kitchen

## 2022-01-14 NOTE — Telephone Encounter (Signed)
Agree with NP student's phone call below. Will place referral, again, to [facility] and decrease torsemide to 20mg  daily per below.    Attempted to call pt with BMP results. No answer and no option to leave VM. Contacted son, updated on kidney function. Nephrology consult already placed. Decrease torsemide to 20 mg QD. Call HF clinic for worsening HF symtoms. Son read back instructions.

## 2022-01-15 ENCOUNTER — Ambulatory Visit (HOSPITAL_COMMUNITY): Payer: Medicare Other | Admitting: Physician Assistant

## 2022-01-16 DIAGNOSIS — I5033 Acute on chronic diastolic (congestive) heart failure: Secondary | ICD-10-CM | POA: Diagnosis not present

## 2022-01-16 DIAGNOSIS — I2510 Atherosclerotic heart disease of native coronary artery without angina pectoris: Secondary | ICD-10-CM | POA: Diagnosis not present

## 2022-01-16 DIAGNOSIS — E1151 Type 2 diabetes mellitus with diabetic peripheral angiopathy without gangrene: Secondary | ICD-10-CM | POA: Diagnosis not present

## 2022-01-16 DIAGNOSIS — I4819 Other persistent atrial fibrillation: Secondary | ICD-10-CM | POA: Diagnosis not present

## 2022-01-16 DIAGNOSIS — I110 Hypertensive heart disease with heart failure: Secondary | ICD-10-CM | POA: Diagnosis not present

## 2022-01-16 DIAGNOSIS — J449 Chronic obstructive pulmonary disease, unspecified: Secondary | ICD-10-CM | POA: Diagnosis not present

## 2022-01-18 ENCOUNTER — Other Ambulatory Visit (HOSPITAL_COMMUNITY): Payer: Medicare Other | Admitting: Physician Assistant

## 2022-01-22 ENCOUNTER — Encounter (HOSPITAL_COMMUNITY): Payer: Self-pay | Admitting: Cardiovascular Disease

## 2022-01-22 DIAGNOSIS — I4819 Other persistent atrial fibrillation: Secondary | ICD-10-CM | POA: Diagnosis not present

## 2022-01-22 DIAGNOSIS — I2510 Atherosclerotic heart disease of native coronary artery without angina pectoris: Secondary | ICD-10-CM | POA: Diagnosis not present

## 2022-01-22 DIAGNOSIS — I110 Hypertensive heart disease with heart failure: Secondary | ICD-10-CM | POA: Diagnosis not present

## 2022-01-22 DIAGNOSIS — J449 Chronic obstructive pulmonary disease, unspecified: Secondary | ICD-10-CM | POA: Diagnosis not present

## 2022-01-22 DIAGNOSIS — I5033 Acute on chronic diastolic (congestive) heart failure: Secondary | ICD-10-CM | POA: Diagnosis not present

## 2022-01-22 DIAGNOSIS — E1151 Type 2 diabetes mellitus with diabetic peripheral angiopathy without gangrene: Secondary | ICD-10-CM | POA: Diagnosis not present

## 2022-01-22 DIAGNOSIS — E875 Hyperkalemia: Secondary | ICD-10-CM | POA: Diagnosis not present

## 2022-01-24 ENCOUNTER — Telehealth: Payer: Self-pay | Admitting: Family

## 2022-01-24 NOTE — Telephone Encounter (Signed)
Received BMP results obtained on 01/22/22 from [facility].  Creatinine 0.99 BUN 30 eGFR 56 Sodium 141 Potassium 5.8   Patient is currently not taking any potassium supplements and does not use NoSalt. Has had hypokalemia in the past.   Jenny [name] (home health nurse) will recheck BMP next week and fax results. If potassium remains elevated, may need to decrease or stop entresto

## 2022-01-25 ENCOUNTER — Ambulatory Visit (HOSPITAL_COMMUNITY): Payer: Medicare Other | Admitting: Physician Assistant

## 2022-01-25 DIAGNOSIS — E669 Obesity, unspecified: Secondary | ICD-10-CM | POA: Diagnosis not present

## 2022-01-25 DIAGNOSIS — I872 Venous insufficiency (chronic) (peripheral): Secondary | ICD-10-CM | POA: Diagnosis not present

## 2022-01-25 DIAGNOSIS — E785 Hyperlipidemia, unspecified: Secondary | ICD-10-CM | POA: Diagnosis not present

## 2022-01-25 DIAGNOSIS — J449 Chronic obstructive pulmonary disease, unspecified: Secondary | ICD-10-CM | POA: Diagnosis not present

## 2022-01-25 DIAGNOSIS — Z7982 Long term (current) use of aspirin: Secondary | ICD-10-CM | POA: Diagnosis not present

## 2022-01-25 DIAGNOSIS — I5033 Acute on chronic diastolic (congestive) heart failure: Secondary | ICD-10-CM | POA: Diagnosis not present

## 2022-01-25 DIAGNOSIS — I110 Hypertensive heart disease with heart failure: Secondary | ICD-10-CM | POA: Diagnosis not present

## 2022-01-25 DIAGNOSIS — F17210 Nicotine dependence, cigarettes, uncomplicated: Secondary | ICD-10-CM | POA: Diagnosis not present

## 2022-01-25 DIAGNOSIS — I4819 Other persistent atrial fibrillation: Secondary | ICD-10-CM | POA: Diagnosis not present

## 2022-01-25 DIAGNOSIS — I2510 Atherosclerotic heart disease of native coronary artery without angina pectoris: Secondary | ICD-10-CM | POA: Diagnosis not present

## 2022-01-25 DIAGNOSIS — I6523 Occlusion and stenosis of bilateral carotid arteries: Secondary | ICD-10-CM | POA: Diagnosis not present

## 2022-01-25 DIAGNOSIS — E1151 Type 2 diabetes mellitus with diabetic peripheral angiopathy without gangrene: Secondary | ICD-10-CM | POA: Diagnosis not present

## 2022-01-25 DIAGNOSIS — Z7901 Long term (current) use of anticoagulants: Secondary | ICD-10-CM | POA: Diagnosis not present

## 2022-01-25 DIAGNOSIS — H409 Unspecified glaucoma: Secondary | ICD-10-CM | POA: Diagnosis not present

## 2022-01-29 DIAGNOSIS — J449 Chronic obstructive pulmonary disease, unspecified: Secondary | ICD-10-CM | POA: Diagnosis not present

## 2022-01-29 DIAGNOSIS — I2510 Atherosclerotic heart disease of native coronary artery without angina pectoris: Secondary | ICD-10-CM | POA: Diagnosis not present

## 2022-01-29 DIAGNOSIS — E1151 Type 2 diabetes mellitus with diabetic peripheral angiopathy without gangrene: Secondary | ICD-10-CM | POA: Diagnosis not present

## 2022-01-29 DIAGNOSIS — I4819 Other persistent atrial fibrillation: Secondary | ICD-10-CM | POA: Diagnosis not present

## 2022-01-29 DIAGNOSIS — I110 Hypertensive heart disease with heart failure: Secondary | ICD-10-CM | POA: Diagnosis not present

## 2022-01-29 DIAGNOSIS — E875 Hyperkalemia: Secondary | ICD-10-CM | POA: Diagnosis not present

## 2022-01-29 DIAGNOSIS — I5033 Acute on chronic diastolic (congestive) heart failure: Secondary | ICD-10-CM | POA: Diagnosis not present

## 2022-01-30 ENCOUNTER — Ambulatory Visit (HOSPITAL_COMMUNITY)
Admission: RE | Admit: 2022-01-30 | Discharge: 2022-01-30 | Disposition: A | Discharge status: Home / Self Care | Payer: Medicare Other | Attending: Cardiovascular Disease | Admitting: Cardiovascular Disease

## 2022-01-30 ENCOUNTER — Ambulatory Visit: Payer: Medicare Other | Admitting: Cardiovascular Disease

## 2022-01-30 ENCOUNTER — Ambulatory Visit (HOSPITAL_COMMUNITY): Payer: Medicare Other | Admitting: Certified Registered"

## 2022-01-30 ENCOUNTER — Encounter (HOSPITAL_COMMUNITY)
Admission: RE | Disposition: A | Discharge status: Home / Self Care | Payer: Medicare Other | Source: Home / Self Care | Attending: Cardiovascular Disease

## 2022-01-30 ENCOUNTER — Encounter (HOSPITAL_COMMUNITY): Payer: Self-pay | Admitting: Cardiovascular Disease

## 2022-01-30 ENCOUNTER — Other Ambulatory Visit: Payer: Self-pay

## 2022-01-30 ENCOUNTER — Ambulatory Visit (HOSPITAL_BASED_OUTPATIENT_CLINIC_OR_DEPARTMENT_OTHER)
Admission: RE | Admit: 2022-01-30 | Discharge: 2022-01-30 | Disposition: A | Discharge status: Home / Self Care | Payer: Medicare Other | Source: Ambulatory Visit | Attending: Physician Assistant | Admitting: Physician Assistant

## 2022-01-30 ENCOUNTER — Ambulatory Visit (HOSPITAL_BASED_OUTPATIENT_CLINIC_OR_DEPARTMENT_OTHER): Payer: Medicare Other | Admitting: Certified Registered"

## 2022-01-30 ENCOUNTER — Encounter (HOSPITAL_COMMUNITY): Payer: Self-pay | Admitting: Physician Assistant

## 2022-01-30 VITALS — BP 120/62 | HR 56 | Ht 62.0 in | Wt 194.2 lb

## 2022-01-30 DIAGNOSIS — N289 Disorder of kidney and ureter, unspecified: Secondary | ICD-10-CM | POA: Insufficient documentation

## 2022-01-30 DIAGNOSIS — I509 Heart failure, unspecified: Secondary | ICD-10-CM | POA: Diagnosis not present

## 2022-01-30 DIAGNOSIS — Z7901 Long term (current) use of anticoagulants: Secondary | ICD-10-CM | POA: Diagnosis not present

## 2022-01-30 DIAGNOSIS — J449 Chronic obstructive pulmonary disease, unspecified: Secondary | ICD-10-CM | POA: Insufficient documentation

## 2022-01-30 DIAGNOSIS — D6869 Other thrombophilia: Secondary | ICD-10-CM | POA: Diagnosis not present

## 2022-01-30 DIAGNOSIS — I110 Hypertensive heart disease with heart failure: Secondary | ICD-10-CM | POA: Diagnosis not present

## 2022-01-30 DIAGNOSIS — E785 Hyperlipidemia, unspecified: Secondary | ICD-10-CM | POA: Insufficient documentation

## 2022-01-30 DIAGNOSIS — Z8616 Personal history of COVID-19: Secondary | ICD-10-CM | POA: Diagnosis not present

## 2022-01-30 DIAGNOSIS — I272 Pulmonary hypertension, unspecified: Secondary | ICD-10-CM | POA: Diagnosis not present

## 2022-01-30 DIAGNOSIS — I252 Old myocardial infarction: Secondary | ICD-10-CM | POA: Insufficient documentation

## 2022-01-30 DIAGNOSIS — Z87891 Personal history of nicotine dependence: Secondary | ICD-10-CM | POA: Insufficient documentation

## 2022-01-30 DIAGNOSIS — E669 Obesity, unspecified: Secondary | ICD-10-CM | POA: Insufficient documentation

## 2022-01-30 DIAGNOSIS — I4891 Unspecified atrial fibrillation: Secondary | ICD-10-CM

## 2022-01-30 DIAGNOSIS — Z7984 Long term (current) use of oral hypoglycemic drugs: Secondary | ICD-10-CM | POA: Diagnosis not present

## 2022-01-30 DIAGNOSIS — I441 Atrioventricular block, second degree: Secondary | ICD-10-CM | POA: Insufficient documentation

## 2022-01-30 DIAGNOSIS — I2510 Atherosclerotic heart disease of native coronary artery without angina pectoris: Secondary | ICD-10-CM | POA: Insufficient documentation

## 2022-01-30 DIAGNOSIS — Z8711 Personal history of peptic ulcer disease: Secondary | ICD-10-CM | POA: Insufficient documentation

## 2022-01-30 DIAGNOSIS — I5032 Chronic diastolic (congestive) heart failure: Secondary | ICD-10-CM | POA: Insufficient documentation

## 2022-01-30 DIAGNOSIS — E119 Type 2 diabetes mellitus without complications: Secondary | ICD-10-CM | POA: Insufficient documentation

## 2022-01-30 DIAGNOSIS — I4819 Other persistent atrial fibrillation: Secondary | ICD-10-CM

## 2022-01-30 DIAGNOSIS — Z6835 Body mass index (BMI) 35.0-35.9, adult: Secondary | ICD-10-CM | POA: Insufficient documentation

## 2022-01-30 HISTORY — PX: CARDIOVERSION: SHX1299

## 2022-01-30 LAB — CBC
HCT: 44 % (ref 36.0–46.0)
Hemoglobin: 14.2 g/dL (ref 12.0–15.0)
MCH: 32 pg (ref 26.0–34.0)
MCHC: 32.3 g/dL (ref 30.0–36.0)
MCV: 99.1 fL (ref 80.0–100.0)
Platelets: 247 10*3/uL (ref 150–400)
RBC: 4.44 MIL/uL (ref 3.87–5.11)
RDW: 14.6 % (ref 11.5–15.5)
WBC: 6.1 10*3/uL (ref 4.0–10.5)
nRBC: 0 % (ref 0.0–0.2)

## 2022-01-30 LAB — BASIC METABOLIC PANEL
Anion gap: 12 (ref 5–15)
BUN: 41 mg/dL — ABNORMAL HIGH (ref 8–23)
CO2: 24 mmol/L (ref 22–32)
Calcium: 8.6 mg/dL — ABNORMAL LOW (ref 8.9–10.3)
Chloride: 99 mmol/L (ref 98–111)
Creatinine, Ser: 1.24 mg/dL — ABNORMAL HIGH (ref 0.44–1.00)
GFR, Estimated: 42 mL/min — ABNORMAL LOW (ref >60)
Glucose, Bld: 128 mg/dL — ABNORMAL HIGH (ref 70–99)
Potassium: 4 mmol/L (ref 3.5–5.1)
Sodium: 135 mmol/L (ref 135–145)

## 2022-01-30 SURGERY — CARDIOVERSION
Anesthesia: General

## 2022-01-30 MED ORDER — PROPOFOL 10 MG/ML IV BOLUS
INTRAVENOUS | Status: DC | PRN
  Administered 2022-01-30: 50 mg via INTRAVENOUS

## 2022-01-30 MED ORDER — LIDOCAINE 2% (20 MG/ML) 5 ML SYRINGE
INTRAMUSCULAR | Status: DC | PRN
Start: 2022-01-30 — End: 2022-01-30
  Administered 2022-01-30: 50 mg via INTRAVENOUS

## 2022-01-30 MED ORDER — SODIUM CHLORIDE 0.9 % IV SOLN: INTRAVENOUS | Status: DC

## 2022-01-30 NOTE — H&P (View-Only) (Signed)
° ° °Primary Care Physician: [facility] °Primary Cardiologist: Dr McAlhany  °Primary Electrophysiologist: none °Referring Physician: Dr Turner  ° ° °Jenny [name] is a 88 y.o. female with a history of CAD, HTN, HLD, COPD, tobacco abuse, carotid artery disease, [place] 1 heart block, DM, and atrial fibrillation who presents for follow up in the [facility]. She was hospitalized in 08/2021 for bilateral lower extremity cellulitis and COPD exacerbation as well as acute on chronic diastolic CHF exacerbation.  2D echo at that time showed normal LV function with EF 60 to 65%, moderate pulmonary hypertension with PASP 65 mmHg and chest x-ray with vascular congestion.  She was diuresed with IV Lasix and transition to p.o. Lasix 40 mg daily. During that hospitalization she was found to be in atrial fibrillation and had not been anticoagulated.  Hospitalist decided not to anticoagulate because of high risk of bleeding with anticoagulation and DAPT.  ° °She was seen by Dr Turner on 10/11/21 and started on Eliquis for a CHADS2VASC score of 6. Zio monitor showed 100% afib burden with bradycardic/controlled rates. Patient was admitted 10/2021 for acute on chronic CHF. She is followed at the [facility].  ° °On follow up today, patient presented to the ED on 01/03/22 for diarrhea and was found to be positive for COVID. She unfortunately missed a dose of Eliquis and her DCCV had to be rescheduled. She remains in rate controlled afib today. She has not eaten today.  ° °Today, she denies symptoms of palpitations, chest pain, orthopnea, PND, lower extremity edema, dizziness, presyncope, syncope, snoring, daytime somnolence, bleeding, or neurologic sequela. The patient is tolerating medications without difficulties and is otherwise without complaint today.  ° ° °Atrial Fibrillation Risk Factors: ° °she does not have symptoms or diagnosis of sleep apnea. °she does not have a history of  rheumatic fever. °she does not have a history of alcohol use. °The patient does not have a history of early familial atrial fibrillation or other arrhythmias. ° °she has a BMI of Body mass index is 35.52 kg/m².. °Filed Weights  ° 01/30/22 0901  °Weight: 88.1 kg  ° ° ° ° °Family History  °Problem Relation Age of Onset  ° Hypertension Mother   ° Ulcers Mother   ° Stomach cancer Father   ° Stroke Maternal Grandmother   ° Heart attack Neg Hx   ° ° ° °Atrial Fibrillation Management history: ° °Previous antiarrhythmic drugs: none °Previous cardioversions: none °Previous ablations: none °CHADS2VASC score: 6 °Anticoagulation history: Eliquis ° ° °Past Medical History:  °Diagnosis Date  ° ACUT DUOD ULCER W/HEMORR&PERF W/O MENTION OBST 10/05/2009  ° NSAID induced  ° ALLERGIC RHINITIS CAUSE UNSPECIFIED   ° ANEMIA-NOS   ° CAD (coronary artery disease) 06/08/2009  ° DEs RCA with 70% LAD and EF 60%  ° CHF (congestive heart failure) (HCC)   ° COPD   ° mild obst on PFTs 03/2010  ° Diabetes mellitus 06/2010 dx  ° Mild, diet controlled  ° GLAUCOMA   ° HYPERLIPIDEMIA   ° HYPERTENSION, BENIGN   ° MYOCARDIAL INFARCTION 06/08/2009  ° des to rca  ° Persistent atrial fibrillation (HCC)   ° Dx 08/2021  ° TOBACCO ABUSE   ° °Past Surgical History:  °Procedure Laterality Date  ° HEMORRHOID SURGERY  1990  ° Right knee surgery    ° ° °Current Outpatient Medications  °Medication Sig Dispense Refill  ° albuterol (VENTOLIN HFA) 108 (90 Base) MCG/ACT inhaler INHALE ONE PUFF   EVERY 6 HOURS AS NEEDED FOR SHORTNESS OF BREATH 18 g 2   apixaban (ELIQUIS) 5 MG TABS tablet Take 1 tablet (5 mg total) by mouth 2 (two) times daily. 60 tablet 11   dorzolamide (TRUSOPT) 2 % ophthalmic solution Place 1 drop into both eyes 2 (two) times daily.     famotidine (PEPCID) 20 MG tablet Take 20 mg by mouth 2 (two) times daily as needed for heartburn.     fluconazole (DIFLUCAN) 150 MG tablet Take 150 mg by mouth once.     Fluticasone-Umeclidin-Vilant (TRELEGY ELLIPTA)  100-62.5-25 MCG/INH AEPB Inhale 1 puff into the lungs daily. 1 each 1   gabapentin (NEURONTIN) 100 MG capsule Take 100-200 mg by mouth See admin instructions. Take 1 capsule (100mg ) by mouth every morning and take 2 capsules (200mg ) by mouth at bedtime     ipratropium-albuterol (DUONEB) 0.5-2.5 (3) MG/3ML SOLN Take 3 mLs by nebulization every 6 (six) hours as needed. 360 mL 1   latanoprost (XALATAN) 0.005 % ophthalmic solution Place 1 drop into both eyes in the morning.     magnesium hydroxide (MILK OF MAGNESIA) 400 MG/5ML suspension Take 30 mLs by mouth as needed for mild constipation.     NITROSTAT 0.4 MG SL tablet DISSOLVE ONE TABLET UNDER TONGUE AS NEEDED FOR CHEST PAIN - MAY REPEAT TWICE-IF NO RELIEF GO TO NEAREST HOSPITAL ER 25 tablet 0   nystatin (MYCOSTATIN/NYSTOP) powder Apply 1 application topically daily.     rosuvastatin (CRESTOR) 20 MG tablet Take 20 mg by mouth daily.     sacubitril-valsartan (ENTRESTO) 24-26 MG Take 1 tablet by mouth 2 (two) times daily. 60 tablet 3   torsemide 40 MG TABS Take 40 mg by mouth daily. And additional 20mg  if needed for weight gain, shortness of breath or swelling 40 tablet 5   No current facility-administered medications for this encounter.    Allergies  Allergen Reactions   Aspirin Other (See Comments)    Can take 81 mg not 325 mg   Cyclobenzaprine     Other reaction(s): muscle relaxers-ulcer hemorrhage (hospitalized)   Lactose Intolerance (Gi) Other (See Comments)    GI upset   Propoxyphene     Other reaction(s): pain meds, hallucination, vomiting   Wound Dressing Adhesive     Other reaction(s): red, stings   Atorvastatin Itching    Itching and myaliga   Meperidine Hcl Other (See Comments)    Not known   Pravastatin Rash    rash    Social History   Socioeconomic History   Marital status: Widowed    Spouse name: Not on file   Number of children: Not on file   Years of education: Not on file   Highest education level: Not on file   Occupational History   Not on file  Tobacco Use   Smoking status: Former    Packs/day: 0.50    Years: 60.00    Pack years: 30.00    Types: Cigarettes    Quit date: 11/12/2021    Years since quitting: 0.2   Smokeless tobacco: Never   Tobacco comments:    She lives in [place] with her significant other (Charles Hook)0  Vaping Use   Vaping Use: Never used  Substance and Sexual Activity   Alcohol use: Yes    Alcohol/week: 1.0 - 2.0 standard drink    Types: 1 - 2 Glasses of wine per week    Comment: once or twice a year glass of wine 12/25/21  Drug use: No   Sexual activity: Not on file  Other Topics Concern   Not on file  Social History Narrative   Lives in [place] with her SO - charles hook   Retired -Catering manager   Enjoys travel - live Photographer   Social Determinants of Radio broadcast assistant Strain: Not on [employer] Insecurity: Not on file  Transportation Needs: Not on file  Physical Activity: Not on file  Stress: Not on file  Social Connections: Not on file  Intimate Partner Violence: Not on file     ROS- All systems are reviewed and negative except as per the HPI above.  Physical Exam: Vitals:   01/30/22 0901  BP: 120/62  Pulse: (!) 56  Weight: 88.1 kg  Height: 5\' 2"  (1.575 m)    GEN- The patient is a well appearing obese elderly female, alert and oriented x 3 today.   HEENT-head normocephalic, atraumatic, sclera clear, conjunctiva pink, hearing intact, trachea midline. Lungs- Clear to ausculation bilaterally, normal work of breathing Heart- irregular rate and rhythm, no murmurs, rubs or gallops  GI- soft, NT, ND, + BS Extremities- no clubbing, cyanosis, or edema MS- no significant deformity or atrophy Skin- no rash or lesion Psych- euthymic mood, full affect Neuro- strength and sensation are intact   Wt Readings from Last 3 Encounters:  01/30/22 88.1 kg  01/14/22 88 kg  12/25/21 91.2 kg    EKG today demonstrates   Afib Vent. rate 56 BPM PR interval * ms QRS duration 68 ms QT/QTcB 446/430 ms  Echo 08/22/21 demonstrated   1. Left ventricular ejection fraction, by estimation, is 60 to 65%. The left ventricle has normal function. The left ventricle has no regional wall motion abnormalities. There is mild left ventricular hypertrophy. Left ventricular diastolic parameters are indeterminate.   2. Right ventricular systolic function is normal. The right ventricular  size is normal. There is severely elevated pulmonary artery systolic  pressure. The estimated right ventricular systolic pressure is 0000000 mmHg.   3. Left atrial size was mildly dilated.   4. Right atrial size was moderately dilated.   5. The mitral valve is normal in structure. Mild mitral valve  regurgitation. Mild mitral stenosis.   6. The inferior vena cava is dilated in size with <50% respiratory  variability, suggesting right atrial pressure of 15 mmHg.   Epic records are reviewed at length today  CHA2DS2-VASc Score = 6  The patient's score is based upon: CHF History: 0 HTN History: 1 Diabetes History: 1 Stroke History: 0 Vascular Disease History: 1 Age Score: 2 Gender Score: 1        ASSESSMENT AND PLAN: 1. Persistent Atrial Fibrillation (ICD10:  I48.19) The patient's CHA2DS2-VASc score is 6, indicating a 9.7% annual risk of stroke.   Patient scheduled for DCCV today. Her bradycardia and baseline conduction disease limit medication options.  Continue Eliquis 5 mg BID, no missed doses since 01/03/22.  2. Secondary Hypercoagulable State (ICD10:  D68.69) The patient is at significant risk for stroke/thromboembolism based upon her CHA2DS2-VASc Score of 6.  Continue Apixaban (Eliquis).   3. Obesity Body mass index is 35.52 kg/m. Lifestyle modification was discussed and encouraged including regular physical activity and weight reduction.  4. HTN Stable, no changes today.  5. CAD No anginal symptoms.  6. Chronic  diastolic CHF Followed in [facility].  No signs or symptoms of fluid overload today.   Follow up in the AF clinic as scheduled post DCCV.  [facility] [address] [place], [place] [address] [phone] 01/30/2022 9:09 AM

## 2022-01-30 NOTE — Transfer of Care (Signed)
Immediate Anesthesia Transfer of Care Note  Patient: Jenny [name]  Procedure(s) Performed: CARDIOVERSION  Patient Location: Endoscopy Unit  Anesthesia Type:General  Level of Consciousness: drowsy  Airway & Oxygen Therapy: Patient Spontanous Breathing  Post-op Assessment: Report given to RN  Post vital signs: Reviewed and stable  Last Vitals:  Vitals Value Taken Time  BP 119/31   Temp    Pulse 52   Resp 17   SpO2 95     Last Pain:  Vitals:   01/30/22 0950  TempSrc: Temporal  PainSc: 0-No pain         Complications: No notable events documented.

## 2022-01-30 NOTE — Discharge Instructions (Signed)

## 2022-01-30 NOTE — Anesthesia Preprocedure Evaluation (Signed)
Anesthesia Evaluation  Patient identified by MRN, date of birth, ID band Patient awake    Reviewed: Allergy & Precautions, NPO status , Patient's Chart, lab work & pertinent test results, reviewed documented beta blocker date and time   Airway Mallampati: II  TM Distance: >3 FB Neck ROM: Full    Dental  (+) Upper Dentures, Missing, Caps, Poor Dentition, Dental Advisory Given,    Pulmonary COPD,  COPD inhaler, former smoker,    Pulmonary exam normal breath sounds clear to auscultation       Cardiovascular hypertension, Pt. on medications + CAD, + Past MI and +CHF  + dysrhythmias Atrial Fibrillation  Rhythm:Irregular Rate:Normal  MI 2010 DES to RCA, LAD   Neuro/Psych negative neurological ROS  negative psych ROS   GI/Hepatic negative GI ROS, Neg liver ROS, PUD,   Endo/Other  diabetes, Well Controlled, Type 2, Oral Hypoglycemic AgentsHyperlipidemia Obesity  Renal/GU Renal InsufficiencyRenal disease  negative genitourinary   Musculoskeletal negative musculoskeletal ROS (+)   Abdominal (+) + obese,   Peds  Hematology  (+) Blood dyscrasia, anemia , Eliquis therapy- last dose 7am   Anesthesia Other Findings   Reproductive/Obstetrics                             Anesthesia Physical Anesthesia Plan  ASA: 3  Anesthesia Plan: General   Post-op Pain Management:    Induction: Intravenous  PONV Risk Score and Plan: 3 and Treatment may vary due to age or medical condition  Airway Management Planned: Natural Airway and Mask  Additional Equipment:   Intra-op Plan:   Post-operative Plan: Extubation in OR  Informed Consent: I have reviewed the patients History and Physical, chart, labs and discussed the procedure including the risks, benefits and alternatives for the proposed anesthesia with the patient or authorized representative who has indicated his/her understanding and acceptance.      Dental advisory given  Plan Discussed with: CRNA and Anesthesiologist  Anesthesia Plan Comments:         Anesthesia Quick Evaluation

## 2022-01-30 NOTE — Anesthesia Postprocedure Evaluation (Signed)
Anesthesia Post Note  Patient: [name]  Procedure(s) Performed: CARDIOVERSION     Patient location during evaluation: PACU Anesthesia Type: General Level of consciousness: awake and alert and oriented Pain management: pain level controlled Vital Signs Assessment: post-procedure vital signs reviewed and stable Respiratory status: spontaneous breathing, nonlabored ventilation and respiratory function stable Cardiovascular status: blood pressure returned to baseline and stable Postop Assessment: no apparent nausea or vomiting Anesthetic complications: no   No notable events documented.  Last Vitals:  Vitals:   01/30/22 0950  BP: (!) 150/44  Pulse: (!) 48  Resp: 14  Temp: (!) 36.3 C  SpO2: 97%    Last Pain:  Vitals:   01/30/22 0950  TempSrc: Temporal  PainSc: 0-No pain                 [name]

## 2022-01-30 NOTE — CV Procedure (Signed)
° ° °  Cardioversion Note  Jenny [name] [id number] 04-20-1933  Procedure: DC Cardioversion Indications: atrial fib   Procedure Details Consent: Obtained Time Out: Verified patient identification, verified procedure, site/side was marked, verified correct patient position, special equipment/implants available, Radiology Safety Procedures followed,  medications/allergies/relevent history reviewed, required imaging and test results available.  Performed  The patient has been on adequate anticoagulation.  The patient received IV Lidocaine 50 mg IV followed by Propofol 50 mg IV  for sedation.  Synchronous cardioversion was performed at 200  joules.  The cardioversion was successful She has Jenny [name] following the cardioversion.  ECG pending     Complications: No apparent complications Patient did tolerate procedure well.   Jenny [name], Jenny [name]., MD, [facility] 01/30/2022, 10:16 AM

## 2022-01-30 NOTE — Progress Notes (Signed)
Primary Care Physician: [place], Anselmo Primary Cardiologist: Dr Angelena Form  Primary Electrophysiologist: none Referring Physician: Dr Edwyna Ready is a 88 y.o. female with a history of CAD, HTN, HLD, COPD, tobacco abuse, carotid artery disease, Mobitz 1 heart block, DM, and atrial fibrillation who presents for follow up in the [facility]. She was hospitalized in 08/2021 for bilateral lower extremity cellulitis and COPD exacerbation as well as acute on chronic diastolic CHF exacerbation.  2D echo at that time showed normal LV function with EF 60 to 65%, moderate pulmonary hypertension with PASP 65 mmHg and chest x-ray with vascular congestion.  She was diuresed with IV Lasix and transition to p.o. Lasix 40 mg daily. During that hospitalization she was found to be in atrial fibrillation and had not been anticoagulated.  Hospitalist decided not to anticoagulate because of high risk of bleeding with anticoagulation and DAPT.   She was seen by Dr Radford Pax on 10/11/21 and started on Eliquis for a CHADS2VASC score of 6. Zio monitor showed 100% afib burden with bradycardic/controlled rates. Patient was admitted 10/2021 for acute on chronic CHF. She is followed at the [facility].   On follow up today, patient presented to the ED on 01/03/22 for diarrhea and was found to be positive for COVID. She unfortunately missed a dose of Eliquis and her DCCV had to be rescheduled. She remains in rate controlled afib today. She has not eaten today.   Today, she denies symptoms of palpitations, chest pain, orthopnea, PND, lower extremity edema, dizziness, presyncope, syncope, snoring, daytime somnolence, bleeding, or neurologic sequela. The patient is tolerating medications without difficulties and is otherwise without complaint today.    Atrial Fibrillation Risk Factors:  she does not have symptoms or diagnosis of sleep apnea. she does not have a history of  rheumatic fever. she does not have a history of alcohol use. The patient does not have a history of early familial atrial fibrillation or other arrhythmias.  she has a BMI of Body mass index is 35.52 kg/m.Marland Kitchen Filed Weights   01/30/22 0901  Weight: 88.1 kg      Family History  Problem Relation Age of Onset   Hypertension Mother    Ulcers Mother    Stomach cancer Father    Stroke Maternal Grandmother    Heart attack Neg Hx      Atrial Fibrillation Management history:  Previous antiarrhythmic drugs: none Previous cardioversions: none Previous ablations: none CHADS2VASC score: 6 Anticoagulation history: Eliquis   Past Medical History:  Diagnosis Date   ACUT DUOD ULCER W/HEMORR&PERF W/O MENTION OBST 10/05/2009   NSAID induced   ALLERGIC RHINITIS CAUSE UNSPECIFIED    ANEMIA-NOS    CAD (coronary artery disease) 06/08/2009   DEs RCA with 70% LAD and EF 60%   CHF (congestive heart failure) ([place])    COPD    mild obst on PFTs 03/2010   Diabetes mellitus 06/2010 dx   Mild, diet controlled   GLAUCOMA    HYPERLIPIDEMIA    HYPERTENSION, BENIGN    MYOCARDIAL INFARCTION 06/08/2009   des to rca   Persistent atrial fibrillation ([place])    Dx 08/2021   TOBACCO ABUSE    Past Surgical History:  Procedure Laterality Date   HEMORRHOID SURGERY  1990   Right knee surgery      Current Outpatient Medications  Medication Sig Dispense Refill   albuterol (VENTOLIN HFA) 108 (90 Base) MCG/ACT inhaler INHALE ONE PUFF  EVERY 6 HOURS AS NEEDED FOR SHORTNESS OF BREATH 18 g 2   apixaban (ELIQUIS) 5 MG TABS tablet Take 1 tablet (5 mg total) by mouth 2 (two) times daily. 60 tablet 11   dorzolamide (TRUSOPT) 2 % ophthalmic solution Place 1 drop into both eyes 2 (two) times daily.     famotidine (PEPCID) 20 MG tablet Take 20 mg by mouth 2 (two) times daily as needed for heartburn.     fluconazole (DIFLUCAN) 150 MG tablet Take 150 mg by mouth once.     Fluticasone-Umeclidin-Vilant (TRELEGY ELLIPTA)  100-62.5-25 MCG/INH AEPB Inhale 1 puff into the lungs daily. 1 each 1   gabapentin (NEURONTIN) 100 MG capsule Take 100-200 mg by mouth See admin instructions. Take 1 capsule (100mg ) by mouth every morning and take 2 capsules (200mg ) by mouth at bedtime     ipratropium-albuterol (DUONEB) 0.5-2.5 (3) MG/3ML SOLN Take 3 mLs by nebulization every 6 (six) hours as needed. 360 mL 1   latanoprost (XALATAN) 0.005 % ophthalmic solution Place 1 drop into both eyes in the morning.     magnesium hydroxide (MILK OF MAGNESIA) 400 MG/5ML suspension Take 30 mLs by mouth as needed for mild constipation.     NITROSTAT 0.4 MG SL tablet DISSOLVE ONE TABLET UNDER TONGUE AS NEEDED FOR CHEST PAIN - MAY REPEAT TWICE-IF NO RELIEF GO TO NEAREST HOSPITAL ER 25 tablet 0   nystatin (MYCOSTATIN/NYSTOP) powder Apply 1 application topically daily.     rosuvastatin (CRESTOR) 20 MG tablet Take 20 mg by mouth daily.     sacubitril-valsartan (ENTRESTO) 24-26 MG Take 1 tablet by mouth 2 (two) times daily. 60 tablet 3   torsemide 40 MG TABS Take 40 mg by mouth daily. And additional 20mg  if needed for weight gain, shortness of breath or swelling 40 tablet 5   No current facility-administered medications for this encounter.    Allergies  Allergen Reactions   Aspirin Other (See Comments)    Can take 81 mg not 325 mg   Cyclobenzaprine     Other reaction(s): muscle relaxers-ulcer hemorrhage (hospitalized)   Lactose Intolerance (Gi) Other (See Comments)    GI upset   Propoxyphene     Other reaction(s): pain meds, hallucination, vomiting   Wound Dressing Adhesive     Other reaction(s): red, stings   Atorvastatin Itching    Itching and myaliga   Meperidine Hcl Other (See Comments)    Not known   Pravastatin Rash    rash    Social History   Socioeconomic History   Marital status: Widowed    Spouse name: Not on file   Number of children: Not on file   Years of education: Not on file   Highest education level: Not on file   Occupational History   Not on file  Tobacco Use   Smoking status: Former    Packs/day: 0.50    Years: 60.00    Pack years: 30.00    Types: Cigarettes    Quit date: 11/12/2021    Years since quitting: 0.2   Smokeless tobacco: Never   Tobacco comments:    She lives in [place] with her significant other (Charles Hook)0  Vaping Use   Vaping Use: Never used  Substance and Sexual Activity   Alcohol use: Yes    Alcohol/week: 1.0 - 2.0 standard drink    Types: 1 - 2 Glasses of wine per week    Comment: once or twice a year glass of wine 12/25/21  Drug use: No   Sexual activity: Not on file  Other Topics Concern   Not on file  Social History Narrative   Lives in [place] with her SO - charles hook   Retired -Catering manager   Enjoys travel - live Photographer   Social Determinants of Radio broadcast assistant Strain: Not on [employer] Insecurity: Not on file  Transportation Needs: Not on file  Physical Activity: Not on file  Stress: Not on file  Social Connections: Not on file  Intimate Partner Violence: Not on file     ROS- All systems are reviewed and negative except as per the HPI above.  Physical Exam: Vitals:   01/30/22 0901  BP: 120/62  Pulse: (!) 56  Weight: 88.1 kg  Height: 5\' 2"  (1.575 m)    GEN- The patient is a well appearing obese elderly female, alert and oriented x 3 today.   HEENT-head normocephalic, atraumatic, sclera clear, conjunctiva pink, hearing intact, trachea midline. Lungs- Clear to ausculation bilaterally, normal work of breathing Heart- irregular rate and rhythm, no murmurs, rubs or gallops  GI- soft, NT, ND, + BS Extremities- no clubbing, cyanosis, or edema MS- no significant deformity or atrophy Skin- no rash or lesion Psych- euthymic mood, full affect Neuro- strength and sensation are intact   Wt Readings from Last 3 Encounters:  01/30/22 88.1 kg  01/14/22 88 kg  12/25/21 91.2 kg    EKG today demonstrates   Afib Vent. rate 56 BPM PR interval * ms QRS duration 68 ms QT/QTcB 446/430 ms  Echo 08/22/21 demonstrated   1. Left ventricular ejection fraction, by estimation, is 60 to 65%. The left ventricle has normal function. The left ventricle has no regional wall motion abnormalities. There is mild left ventricular hypertrophy. Left ventricular diastolic parameters are indeterminate.   2. Right ventricular systolic function is normal. The right ventricular  size is normal. There is severely elevated pulmonary artery systolic  pressure. The estimated right ventricular systolic pressure is 0000000 mmHg.   3. Left atrial size was mildly dilated.   4. Right atrial size was moderately dilated.   5. The mitral valve is normal in structure. Mild mitral valve  regurgitation. Mild mitral stenosis.   6. The inferior vena cava is dilated in size with <50% respiratory  variability, suggesting right atrial pressure of 15 mmHg.   Epic records are reviewed at length today  CHA2DS2-VASc Score = 6  The patient's score is based upon: CHF History: 0 HTN History: 1 Diabetes History: 1 Stroke History: 0 Vascular Disease History: 1 Age Score: 2 Gender Score: 1        ASSESSMENT AND PLAN: 1. Persistent Atrial Fibrillation (ICD10:  I48.19) The patient's CHA2DS2-VASc score is 6, indicating a 9.7% annual risk of stroke.   Patient scheduled for DCCV today. Her bradycardia and baseline conduction disease limit medication options.  Continue Eliquis 5 mg BID, no missed doses since 01/03/22.  2. Secondary Hypercoagulable State (ICD10:  D68.69) The patient is at significant risk for stroke/thromboembolism based upon her CHA2DS2-VASc Score of 6.  Continue Apixaban (Eliquis).   3. Obesity Body mass index is 35.52 kg/m. Lifestyle modification was discussed and encouraged including regular physical activity and weight reduction.  4. HTN Stable, no changes today.  5. CAD No anginal symptoms.  6. Chronic  diastolic CHF Followed in [facility].  No signs or symptoms of fluid overload today.   Follow up in the AF clinic as scheduled post DCCV.  [facility] [address] [place], [place] [address] [phone] 01/30/2022 9:09 AM

## 2022-01-30 NOTE — Interval H&P Note (Signed)
History and Physical Interval Note:  01/30/2022 9:56 AM  Jenny [name]  has presented today for surgery, with the diagnosis of AFIB.  The various methods of treatment have been discussed with the patient and family. After consideration of risks, benefits and other options for treatment, the patient has consented to  Procedure(s): CARDIOVERSION (N/A) as a surgical intervention.  The patient's history has been reviewed, patient examined, no change in status, stable for surgery.  I have reviewed the patient's chart and labs.  Questions were answered to the patient's satisfaction.     Kristeen Miss

## 2022-02-04 DIAGNOSIS — I5033 Acute on chronic diastolic (congestive) heart failure: Secondary | ICD-10-CM | POA: Diagnosis not present

## 2022-02-04 DIAGNOSIS — I2510 Atherosclerotic heart disease of native coronary artery without angina pectoris: Secondary | ICD-10-CM | POA: Diagnosis not present

## 2022-02-04 DIAGNOSIS — I110 Hypertensive heart disease with heart failure: Secondary | ICD-10-CM | POA: Diagnosis not present

## 2022-02-04 DIAGNOSIS — J449 Chronic obstructive pulmonary disease, unspecified: Secondary | ICD-10-CM | POA: Diagnosis not present

## 2022-02-04 DIAGNOSIS — E1151 Type 2 diabetes mellitus with diabetic peripheral angiopathy without gangrene: Secondary | ICD-10-CM | POA: Diagnosis not present

## 2022-02-04 DIAGNOSIS — I4819 Other persistent atrial fibrillation: Secondary | ICD-10-CM | POA: Diagnosis not present

## 2022-02-05 DIAGNOSIS — M9901 Segmental and somatic dysfunction of cervical region: Secondary | ICD-10-CM | POA: Diagnosis not present

## 2022-02-05 DIAGNOSIS — M50322 Other cervical disc degeneration at C5-C6 level: Secondary | ICD-10-CM | POA: Diagnosis not present

## 2022-02-06 ENCOUNTER — Other Ambulatory Visit: Payer: Self-pay | Admitting: Family

## 2022-02-07 ENCOUNTER — Ambulatory Visit (HOSPITAL_COMMUNITY)
Admission: RE | Admit: 2022-02-07 | Discharge: 2022-02-07 | Disposition: A | Discharge status: Home / Self Care | Payer: Medicare Other | Source: Ambulatory Visit | Attending: Physician Assistant | Admitting: Physician Assistant

## 2022-02-07 ENCOUNTER — Other Ambulatory Visit: Payer: Self-pay

## 2022-02-07 ENCOUNTER — Encounter (HOSPITAL_COMMUNITY): Payer: Self-pay | Admitting: Physician Assistant

## 2022-02-07 VITALS — BP 118/66 | HR 91 | Ht 62.0 in | Wt 203.8 lb

## 2022-02-07 DIAGNOSIS — J449 Chronic obstructive pulmonary disease, unspecified: Secondary | ICD-10-CM | POA: Diagnosis not present

## 2022-02-07 DIAGNOSIS — E669 Obesity, unspecified: Secondary | ICD-10-CM | POA: Insufficient documentation

## 2022-02-07 DIAGNOSIS — D6869 Other thrombophilia: Secondary | ICD-10-CM | POA: Diagnosis not present

## 2022-02-07 DIAGNOSIS — I152 Hypertension secondary to endocrine disorders: Secondary | ICD-10-CM | POA: Insufficient documentation

## 2022-02-07 DIAGNOSIS — Z87891 Personal history of nicotine dependence: Secondary | ICD-10-CM | POA: Insufficient documentation

## 2022-02-07 DIAGNOSIS — E785 Hyperlipidemia, unspecified: Secondary | ICD-10-CM | POA: Diagnosis not present

## 2022-02-07 DIAGNOSIS — Z7901 Long term (current) use of anticoagulants: Secondary | ICD-10-CM | POA: Insufficient documentation

## 2022-02-07 DIAGNOSIS — I5032 Chronic diastolic (congestive) heart failure: Secondary | ICD-10-CM | POA: Insufficient documentation

## 2022-02-07 DIAGNOSIS — Z6837 Body mass index (BMI) 37.0-37.9, adult: Secondary | ICD-10-CM | POA: Diagnosis not present

## 2022-02-07 DIAGNOSIS — I110 Hypertensive heart disease with heart failure: Secondary | ICD-10-CM | POA: Diagnosis not present

## 2022-02-07 DIAGNOSIS — I4819 Other persistent atrial fibrillation: Secondary | ICD-10-CM | POA: Diagnosis not present

## 2022-02-07 DIAGNOSIS — I2510 Atherosclerotic heart disease of native coronary artery without angina pectoris: Secondary | ICD-10-CM | POA: Diagnosis not present

## 2022-02-07 DIAGNOSIS — E119 Type 2 diabetes mellitus without complications: Secondary | ICD-10-CM | POA: Diagnosis not present

## 2022-02-07 NOTE — Progress Notes (Signed)
Primary Care Physician: [facility] Primary Cardiologist: Dr Clifton JamesMcAlhany  Primary Electrophysiologist: none Referring Physician: Dr Florence Cannerurner    Jenny [name] is a 88 y.o. female with a history of CAD, HTN, HLD, COPD, tobacco abuse, carotid artery disease, Mobitz 1 heart block, DM, and atrial fibrillation who presents for follow up in the [facility]. She was hospitalized in 08/2021 for bilateral lower extremity cellulitis and COPD exacerbation as well as acute on chronic diastolic CHF exacerbation.  2D echo at that time showed normal LV function with EF 60 to 65%, moderate pulmonary hypertension with PASP 65 mmHg and chest x-ray with vascular congestion.  She was diuresed with IV Lasix and transition to p.o. Lasix 40 mg daily. During that hospitalization she was found to be in atrial fibrillation and had not been anticoagulated.  Hospitalist decided not to anticoagulate because of high risk of bleeding with anticoagulation and DAPT.   She was seen by Dr Mayford Knifeurner on 10/11/21 and started on Eliquis for a CHADS2VASC score of 6. Zio monitor showed 100% afib burden with bradycardic/controlled rates. Patient was admitted 10/2021 for acute on chronic CHF. She is [facility]. Patient presented to the ED on 01/03/22 for diarrhea and was found to be positive for COVID. She unfortunately missed a dose of Eliquis and her DCCV had to be rescheduled.   On follow up today, patient is s/p DCCV on 01/30/22. She states that she has more energy since the DCCV. No complications from the procedure. No bleeding issues on anticoagulation.   Today, she denies symptoms of palpitations, chest pain, orthopnea, PND, lower extremity edema, dizziness, presyncope, syncope, snoring, daytime somnolence, bleeding, or neurologic sequela. The patient is tolerating medications without difficulties and is otherwise without complaint today.    Atrial Fibrillation Risk Factors:  she  does not have symptoms or diagnosis of sleep apnea. she does not have a history of rheumatic fever. she does not have a history of alcohol use. The patient does not have a history of early familial atrial fibrillation or other arrhythmias.  she has a BMI of Body mass index is 37.28 kg/m.Marland Kitchen. Filed Weights   02/07/22 0947  Weight: 92.4 kg     Family History  Problem Relation Age of Onset   Hypertension Mother    Ulcers Mother    Stomach cancer Father    Stroke Maternal Grandmother    Heart attack Neg Hx      Atrial Fibrillation Management history:  Previous antiarrhythmic drugs: none Previous cardioversions: 01/30/22 Previous ablations: none CHADS2VASC score: 6 Anticoagulation history: Eliquis   Past Medical History:  Diagnosis Date   ACUT DUOD ULCER W/HEMORR&PERF W/O MENTION OBST 10/05/2009   NSAID induced   ALLERGIC RHINITIS CAUSE UNSPECIFIED    ANEMIA-NOS    CAD (coronary artery disease) 06/08/2009   DEs RCA with 70% LAD and EF 60%   CHF (congestive heart failure) (HCC)    COPD    mild obst on PFTs 03/2010   Diabetes mellitus 06/2010 dx   Mild, diet controlled   GLAUCOMA    HYPERLIPIDEMIA    HYPERTENSION, BENIGN    MYOCARDIAL INFARCTION 06/08/2009   des to rca   Persistent atrial fibrillation (HCC)    Dx 08/2021   TOBACCO ABUSE    Past Surgical History:  Procedure Laterality Date   CARDIOVERSION N/A 01/30/2022   Procedure: CARDIOVERSION;  Surgeon: Elease HashimotoNahser, Deloris PingPhilip J, MD;  Location: [facility];  Service: Cardiovascular;  Laterality:  N/A;   HEMORRHOID SURGERY  1990   Right knee surgery      Current Outpatient Medications  Medication Sig Dispense Refill   albuterol (VENTOLIN HFA) 108 (90 Base) MCG/ACT inhaler INHALE ONE PUFF EVERY 6 HOURS AS NEEDED FOR SHORTNESS OF BREATH 18 g 2   apixaban (ELIQUIS) 5 MG TABS tablet Take 1 tablet (5 mg total) by mouth 2 (two) times daily. 60 tablet 11   dorzolamide (TRUSOPT) 2 % ophthalmic solution Place 1 drop into both eyes  2 (two) times daily.     famotidine (PEPCID) 20 MG tablet Take 20 mg by mouth 2 (two) times daily as needed for heartburn.     Fluticasone-Umeclidin-Vilant (TRELEGY ELLIPTA) 100-62.5-25 MCG/INH AEPB Inhale 1 puff into the lungs daily. 1 each 1   gabapentin (NEURONTIN) 100 MG capsule Take 100-200 mg by mouth See admin instructions. Take 1 capsule (100mg ) by mouth every morning and take 2 capsules (200mg ) by mouth at bedtime     ipratropium-albuterol (DUONEB) 0.5-2.5 (3) MG/3ML SOLN Take 3 mLs by nebulization every 6 (six) hours as needed. 360 mL 1   latanoprost (XALATAN) 0.005 % ophthalmic solution Place 1 drop into both eyes in the morning.     magnesium hydroxide (MILK OF MAGNESIA) 400 MG/5ML suspension Take 30 mLs by mouth as needed for mild constipation.     NITROSTAT 0.4 MG SL tablet DISSOLVE ONE TABLET UNDER TONGUE AS NEEDED FOR CHEST PAIN - MAY REPEAT TWICE-IF NO RELIEF GO TO NEAREST HOSPITAL ER 25 tablet 0   nystatin (MYCOSTATIN/NYSTOP) powder Apply 1 application topically daily.     rosuvastatin (CRESTOR) 20 MG tablet Take 20 mg by mouth daily.     sacubitril-valsartan (ENTRESTO) 24-26 MG Take 1 tablet by mouth 2 (two) times daily. 60 tablet 3   torsemide 40 MG TABS Take 40 mg by mouth daily. And additional 20mg  if needed for weight gain, shortness of breath or swelling 40 tablet 5   No current facility-administered medications for this encounter.    Allergies  Allergen Reactions   Aspirin Other (See Comments)    Can take 81 mg not 325 mg   Cyclobenzaprine     Other reaction(s): muscle relaxers-ulcer hemorrhage (hospitalized)   Lactose Intolerance (Gi) Other (See Comments)    GI upset   Propoxyphene     Other reaction(s): pain meds, hallucination, vomiting   Wound Dressing Adhesive     Other reaction(s): red, stings   Atorvastatin Itching    Itching and myaliga   Meperidine Hcl Other (See Comments)    Not known   Pravastatin Rash    rash    Social History   Socioeconomic  History   Marital status: Widowed    Spouse name: Not on file   Number of children: Not on file   Years of education: Not on file   Highest education level: Not on file  Occupational History   Not on file  Tobacco Use   Smoking status: Former    Packs/day: 0.50    Years: 60.00    Pack years: 30.00    Types: Cigarettes    Quit date: 11/12/2021    Years since quitting: 0.2   Smokeless tobacco: Never   Tobacco comments:    She lives in [place] with her significant other (Charles Hook)0  Vaping Use   Vaping Use: Never used  Substance and Sexual Activity   Alcohol use: Yes    Alcohol/week: 1.0 - 2.0 standard drink    Types:  1 - 2 Glasses of wine per week    Comment: once or twice a year glass of wine 12/25/21   Drug use: No   Sexual activity: Not on file  Other Topics Concern   Not on file  Social History Narrative   Lives in [place] with her SO - charles hook   Retired -Catering manager   Enjoys travel - live Photographer   Social Determinants of Radio broadcast assistant Strain: Not on file  Food Insecurity: Not on file  Transportation Needs: Not on file  Physical Activity: Not on file  Stress: Not on file  Social Connections: Not on file  Intimate Partner Violence: Not on file     ROS- All systems are reviewed and negative except as per the HPI above.  Physical Exam: Vitals:   02/07/22 0947  BP: 118/66  Pulse: 91  Weight: 92.4 kg  Height: 5\' 2"  (1.575 m)    GEN- The patient is a well appearing obese elderly female, alert and oriented x 3 today.   HEENT-head normocephalic, atraumatic, sclera clear, conjunctiva pink, hearing intact, trachea midline. Lungs- Clear to ausculation bilaterally, normal work of breathing Heart- Regular rate and rhythm, no murmurs, rubs or gallops  GI- soft, NT, ND, + BS Extremities- no clubbing, cyanosis, or edema MS- no significant deformity or atrophy Skin- no rash or lesion Psych- euthymic mood, full affect Neuro-  strength and sensation are intact   Wt Readings from Last 3 Encounters:  02/07/22 92.4 kg  01/30/22 88.1 kg  01/30/22 88.1 kg    EKG today demonstrates  Accelerated junctional rhythm Vent. rate 91 BPM PR interval * ms QRS duration 62 ms QT/QTcB 366/450 ms  Echo 08/22/21 demonstrated   1. Left ventricular ejection fraction, by estimation, is 60 to 65%. The left ventricle has normal function. The left ventricle has no regional wall motion abnormalities. There is mild left ventricular hypertrophy. Left ventricular diastolic parameters are indeterminate.   2. Right ventricular systolic function is normal. The right ventricular  size is normal. There is severely elevated pulmonary artery systolic  pressure. The estimated right ventricular systolic pressure is 0000000 mmHg.   3. Left atrial size was mildly dilated.   4. Right atrial size was moderately dilated.   5. The mitral valve is normal in structure. Mild mitral valve  regurgitation. Mild mitral stenosis.   6. The inferior vena cava is dilated in size with <50% respiratory  variability, suggesting right atrial pressure of 15 mmHg.   Epic records are reviewed at length today  CHA2DS2-VASc Score = 6  The patient's score is based upon: CHF History: 0 HTN History: 1 Diabetes History: 1 Stroke History: 0 Vascular Disease History: 1 Age Score: 2 Gender Score: 1       ASSESSMENT AND PLAN: 1. Persistent Atrial Fibrillation (ICD10:  I48.19) The patient's CHA2DS2-VASc score is 6, indicating a 9.7% annual risk of stroke.   S/p DCCV 01/30/22 Patient in an accelerated junctional rhythm today, had Mobitz I block post DCCV. Her bradycardia and baseline conduction disease limit medication options. May eventually need EP evaluation if she develops high grade block.  Continue Eliquis 5 mg BID  2. Secondary Hypercoagulable State (ICD10:  D68.69) The patient is at significant risk for stroke/thromboembolism based upon her CHA2DS2-VASc Score  of 6.  Continue Apixaban (Eliquis).   3. Obesity Body mass index is 37.28 kg/m. Lifestyle modification was discussed and encouraged including regular physical activity and weight reduction.  4. HTN  Stable, no changes today.  5. CAD No anginal symptoms.  6. Chronic diastolic CHF Followed in [facility].  No signs or symptoms of fluid overload.   Follow up with Dr Angelena Form as scheduled.    [facility] [address] [place], [place] [address] [phone] 02/07/2022 10:15 AM

## 2022-02-08 DIAGNOSIS — Z7901 Long term (current) use of anticoagulants: Secondary | ICD-10-CM | POA: Diagnosis not present

## 2022-02-08 DIAGNOSIS — R54 Age-related physical debility: Secondary | ICD-10-CM | POA: Diagnosis not present

## 2022-02-08 DIAGNOSIS — J449 Chronic obstructive pulmonary disease, unspecified: Secondary | ICD-10-CM | POA: Diagnosis not present

## 2022-02-08 DIAGNOSIS — I872 Venous insufficiency (chronic) (peripheral): Secondary | ICD-10-CM | POA: Diagnosis not present

## 2022-02-08 DIAGNOSIS — E785 Hyperlipidemia, unspecified: Secondary | ICD-10-CM | POA: Diagnosis not present

## 2022-02-08 DIAGNOSIS — I5030 Unspecified diastolic (congestive) heart failure: Secondary | ICD-10-CM | POA: Diagnosis not present

## 2022-02-08 DIAGNOSIS — I4819 Other persistent atrial fibrillation: Secondary | ICD-10-CM | POA: Diagnosis not present

## 2022-02-12 DIAGNOSIS — I5033 Acute on chronic diastolic (congestive) heart failure: Secondary | ICD-10-CM | POA: Diagnosis not present

## 2022-02-12 DIAGNOSIS — I110 Hypertensive heart disease with heart failure: Secondary | ICD-10-CM | POA: Diagnosis not present

## 2022-02-12 DIAGNOSIS — J449 Chronic obstructive pulmonary disease, unspecified: Secondary | ICD-10-CM | POA: Diagnosis not present

## 2022-02-12 DIAGNOSIS — E1151 Type 2 diabetes mellitus with diabetic peripheral angiopathy without gangrene: Secondary | ICD-10-CM | POA: Diagnosis not present

## 2022-02-12 DIAGNOSIS — I2510 Atherosclerotic heart disease of native coronary artery without angina pectoris: Secondary | ICD-10-CM | POA: Diagnosis not present

## 2022-02-12 DIAGNOSIS — I4819 Other persistent atrial fibrillation: Secondary | ICD-10-CM | POA: Diagnosis not present

## 2022-02-13 ENCOUNTER — Telehealth: Payer: Self-pay | Admitting: Family

## 2022-02-13 DIAGNOSIS — J449 Chronic obstructive pulmonary disease, unspecified: Secondary | ICD-10-CM | POA: Diagnosis not present

## 2022-02-13 DIAGNOSIS — I5033 Acute on chronic diastolic (congestive) heart failure: Secondary | ICD-10-CM | POA: Diagnosis not present

## 2022-02-13 DIAGNOSIS — I110 Hypertensive heart disease with heart failure: Secondary | ICD-10-CM | POA: Diagnosis not present

## 2022-02-13 DIAGNOSIS — I2510 Atherosclerotic heart disease of native coronary artery without angina pectoris: Secondary | ICD-10-CM | POA: Diagnosis not present

## 2022-02-13 DIAGNOSIS — E1151 Type 2 diabetes mellitus with diabetic peripheral angiopathy without gangrene: Secondary | ICD-10-CM | POA: Diagnosis not present

## 2022-02-13 DIAGNOSIS — I4819 Other persistent atrial fibrillation: Secondary | ICD-10-CM | POA: Diagnosis not present

## 2022-02-13 NOTE — Telephone Encounter (Signed)
Returned patients call after she frantically left a voicemail concerned about her upcomming appointment with nephrology and does not have any information. I called [facility] and received info on her appointment and called patient back with the information. They also stated they would be reaching out to her as well. She sees them 3/16 at 11am ? ? ?Jenny [name], NT ?

## 2022-02-18 DIAGNOSIS — I5033 Acute on chronic diastolic (congestive) heart failure: Secondary | ICD-10-CM | POA: Diagnosis not present

## 2022-02-18 DIAGNOSIS — E1151 Type 2 diabetes mellitus with diabetic peripheral angiopathy without gangrene: Secondary | ICD-10-CM | POA: Diagnosis not present

## 2022-02-18 DIAGNOSIS — I110 Hypertensive heart disease with heart failure: Secondary | ICD-10-CM | POA: Diagnosis not present

## 2022-02-18 DIAGNOSIS — J449 Chronic obstructive pulmonary disease, unspecified: Secondary | ICD-10-CM | POA: Diagnosis not present

## 2022-02-18 DIAGNOSIS — I2510 Atherosclerotic heart disease of native coronary artery without angina pectoris: Secondary | ICD-10-CM | POA: Diagnosis not present

## 2022-02-18 DIAGNOSIS — I4819 Other persistent atrial fibrillation: Secondary | ICD-10-CM | POA: Diagnosis not present

## 2022-02-19 DIAGNOSIS — M50322 Other cervical disc degeneration at C5-C6 level: Secondary | ICD-10-CM | POA: Diagnosis not present

## 2022-02-19 DIAGNOSIS — M9901 Segmental and somatic dysfunction of cervical region: Secondary | ICD-10-CM | POA: Diagnosis not present

## 2022-02-20 DIAGNOSIS — R54 Age-related physical debility: Secondary | ICD-10-CM | POA: Diagnosis not present

## 2022-02-20 DIAGNOSIS — J449 Chronic obstructive pulmonary disease, unspecified: Secondary | ICD-10-CM | POA: Diagnosis not present

## 2022-02-20 DIAGNOSIS — E1142 Type 2 diabetes mellitus with diabetic polyneuropathy: Secondary | ICD-10-CM | POA: Diagnosis not present

## 2022-02-20 DIAGNOSIS — M542 Cervicalgia: Secondary | ICD-10-CM | POA: Diagnosis not present

## 2022-02-20 DIAGNOSIS — I5030 Unspecified diastolic (congestive) heart failure: Secondary | ICD-10-CM | POA: Diagnosis not present

## 2022-02-20 DIAGNOSIS — Z7189 Other specified counseling: Secondary | ICD-10-CM | POA: Diagnosis not present

## 2022-02-20 DIAGNOSIS — H54414A Blindness right eye category 4, normal vision left eye: Secondary | ICD-10-CM | POA: Diagnosis not present

## 2022-02-20 DIAGNOSIS — Z9289 Personal history of other medical treatment: Secondary | ICD-10-CM | POA: Diagnosis not present

## 2022-02-21 DIAGNOSIS — I480 Paroxysmal atrial fibrillation: Secondary | ICD-10-CM | POA: Insufficient documentation

## 2022-02-21 DIAGNOSIS — I252 Old myocardial infarction: Secondary | ICD-10-CM | POA: Diagnosis not present

## 2022-02-21 DIAGNOSIS — D631 Anemia in chronic kidney disease: Secondary | ICD-10-CM | POA: Diagnosis not present

## 2022-02-21 DIAGNOSIS — R609 Edema, unspecified: Secondary | ICD-10-CM | POA: Diagnosis not present

## 2022-02-21 DIAGNOSIS — N189 Chronic kidney disease, unspecified: Secondary | ICD-10-CM | POA: Insufficient documentation

## 2022-02-21 DIAGNOSIS — I509 Heart failure, unspecified: Secondary | ICD-10-CM | POA: Diagnosis not present

## 2022-02-21 DIAGNOSIS — I10 Essential (primary) hypertension: Secondary | ICD-10-CM | POA: Insufficient documentation

## 2022-02-21 DIAGNOSIS — D649 Anemia, unspecified: Secondary | ICD-10-CM | POA: Insufficient documentation

## 2022-02-21 DIAGNOSIS — E785 Hyperlipidemia, unspecified: Secondary | ICD-10-CM | POA: Insufficient documentation

## 2022-02-21 DIAGNOSIS — N1832 Chronic kidney disease, stage 3b: Secondary | ICD-10-CM | POA: Insufficient documentation

## 2022-02-21 DIAGNOSIS — J449 Chronic obstructive pulmonary disease, unspecified: Secondary | ICD-10-CM | POA: Insufficient documentation

## 2022-02-21 DIAGNOSIS — E1122 Type 2 diabetes mellitus with diabetic chronic kidney disease: Secondary | ICD-10-CM | POA: Diagnosis not present

## 2022-02-22 ENCOUNTER — Other Ambulatory Visit: Payer: Self-pay | Admitting: Nephrology

## 2022-02-22 DIAGNOSIS — E1151 Type 2 diabetes mellitus with diabetic peripheral angiopathy without gangrene: Secondary | ICD-10-CM | POA: Diagnosis not present

## 2022-02-22 DIAGNOSIS — D631 Anemia in chronic kidney disease: Secondary | ICD-10-CM

## 2022-02-22 DIAGNOSIS — I5033 Acute on chronic diastolic (congestive) heart failure: Secondary | ICD-10-CM | POA: Diagnosis not present

## 2022-02-22 DIAGNOSIS — I2510 Atherosclerotic heart disease of native coronary artery without angina pectoris: Secondary | ICD-10-CM | POA: Diagnosis not present

## 2022-02-22 DIAGNOSIS — J449 Chronic obstructive pulmonary disease, unspecified: Secondary | ICD-10-CM | POA: Diagnosis not present

## 2022-02-22 DIAGNOSIS — I4819 Other persistent atrial fibrillation: Secondary | ICD-10-CM | POA: Diagnosis not present

## 2022-02-22 DIAGNOSIS — E1122 Type 2 diabetes mellitus with diabetic chronic kidney disease: Secondary | ICD-10-CM

## 2022-02-22 DIAGNOSIS — I110 Hypertensive heart disease with heart failure: Secondary | ICD-10-CM | POA: Diagnosis not present

## 2022-02-24 ENCOUNTER — Telehealth: Payer: Self-pay | Admitting: Family

## 2022-02-24 DIAGNOSIS — I2510 Atherosclerotic heart disease of native coronary artery without angina pectoris: Secondary | ICD-10-CM | POA: Diagnosis not present

## 2022-02-24 DIAGNOSIS — F17210 Nicotine dependence, cigarettes, uncomplicated: Secondary | ICD-10-CM | POA: Diagnosis not present

## 2022-02-24 DIAGNOSIS — E669 Obesity, unspecified: Secondary | ICD-10-CM | POA: Diagnosis not present

## 2022-02-24 DIAGNOSIS — I5033 Acute on chronic diastolic (congestive) heart failure: Secondary | ICD-10-CM | POA: Diagnosis not present

## 2022-02-24 DIAGNOSIS — E785 Hyperlipidemia, unspecified: Secondary | ICD-10-CM | POA: Diagnosis not present

## 2022-02-24 DIAGNOSIS — I6523 Occlusion and stenosis of bilateral carotid arteries: Secondary | ICD-10-CM | POA: Diagnosis not present

## 2022-02-24 DIAGNOSIS — Z7982 Long term (current) use of aspirin: Secondary | ICD-10-CM | POA: Diagnosis not present

## 2022-02-24 DIAGNOSIS — E1151 Type 2 diabetes mellitus with diabetic peripheral angiopathy without gangrene: Secondary | ICD-10-CM | POA: Diagnosis not present

## 2022-02-24 DIAGNOSIS — H409 Unspecified glaucoma: Secondary | ICD-10-CM | POA: Diagnosis not present

## 2022-02-24 DIAGNOSIS — Z7901 Long term (current) use of anticoagulants: Secondary | ICD-10-CM | POA: Diagnosis not present

## 2022-02-24 DIAGNOSIS — J449 Chronic obstructive pulmonary disease, unspecified: Secondary | ICD-10-CM | POA: Diagnosis not present

## 2022-02-24 DIAGNOSIS — I872 Venous insufficiency (chronic) (peripheral): Secondary | ICD-10-CM | POA: Diagnosis not present

## 2022-02-24 DIAGNOSIS — I110 Hypertensive heart disease with heart failure: Secondary | ICD-10-CM | POA: Diagnosis not present

## 2022-02-24 DIAGNOSIS — I4819 Other persistent atrial fibrillation: Secondary | ICD-10-CM | POA: Diagnosis not present

## 2022-02-24 NOTE — Telephone Encounter (Signed)
Late entry from text on 02/22/22 received from [facility] nurse.  ? ?Patient's weight up to 206 pounds. Did not take torsemide yesterday due to going to nephrology appointment. O2 was 100%. Lungs CTA but abdomen distended. Yesterday she ate 2 hot dogs and a BBQ sandwich.  ? ?Advised Aimee to tell patient to take 60mg  daily of torsemide until her weight gets back to baseline and then resume her 40mg  daily. She will check a BMP on 02/27/22 and fax results to . Aimee also reviewed high sodium foods as she said she was going to eat canned soup for lunch.  ?

## 2022-02-25 ENCOUNTER — Other Ambulatory Visit: Payer: Medicare Other

## 2022-02-25 DIAGNOSIS — I2510 Atherosclerotic heart disease of native coronary artery without angina pectoris: Secondary | ICD-10-CM | POA: Diagnosis not present

## 2022-02-25 DIAGNOSIS — I4819 Other persistent atrial fibrillation: Secondary | ICD-10-CM | POA: Diagnosis not present

## 2022-02-25 DIAGNOSIS — I110 Hypertensive heart disease with heart failure: Secondary | ICD-10-CM | POA: Diagnosis not present

## 2022-02-25 DIAGNOSIS — J449 Chronic obstructive pulmonary disease, unspecified: Secondary | ICD-10-CM | POA: Diagnosis not present

## 2022-02-25 DIAGNOSIS — I5033 Acute on chronic diastolic (congestive) heart failure: Secondary | ICD-10-CM | POA: Diagnosis not present

## 2022-02-25 DIAGNOSIS — E1151 Type 2 diabetes mellitus with diabetic peripheral angiopathy without gangrene: Secondary | ICD-10-CM | POA: Diagnosis not present

## 2022-02-26 DIAGNOSIS — H54414A Blindness right eye category 4, normal vision left eye: Secondary | ICD-10-CM | POA: Diagnosis not present

## 2022-02-26 DIAGNOSIS — I5030 Unspecified diastolic (congestive) heart failure: Secondary | ICD-10-CM | POA: Diagnosis not present

## 2022-02-26 DIAGNOSIS — M9901 Segmental and somatic dysfunction of cervical region: Secondary | ICD-10-CM | POA: Diagnosis not present

## 2022-02-26 DIAGNOSIS — E1142 Type 2 diabetes mellitus with diabetic polyneuropathy: Secondary | ICD-10-CM | POA: Diagnosis not present

## 2022-02-26 DIAGNOSIS — Z9289 Personal history of other medical treatment: Secondary | ICD-10-CM | POA: Diagnosis not present

## 2022-02-26 DIAGNOSIS — J449 Chronic obstructive pulmonary disease, unspecified: Secondary | ICD-10-CM | POA: Diagnosis not present

## 2022-02-26 DIAGNOSIS — Z7189 Other specified counseling: Secondary | ICD-10-CM | POA: Diagnosis not present

## 2022-02-26 DIAGNOSIS — M542 Cervicalgia: Secondary | ICD-10-CM | POA: Diagnosis not present

## 2022-02-26 DIAGNOSIS — M50322 Other cervical disc degeneration at C5-C6 level: Secondary | ICD-10-CM | POA: Diagnosis not present

## 2022-02-26 DIAGNOSIS — R54 Age-related physical debility: Secondary | ICD-10-CM | POA: Diagnosis not present

## 2022-02-27 DIAGNOSIS — E1151 Type 2 diabetes mellitus with diabetic peripheral angiopathy without gangrene: Secondary | ICD-10-CM | POA: Diagnosis not present

## 2022-02-27 DIAGNOSIS — J449 Chronic obstructive pulmonary disease, unspecified: Secondary | ICD-10-CM | POA: Diagnosis not present

## 2022-02-27 DIAGNOSIS — I110 Hypertensive heart disease with heart failure: Secondary | ICD-10-CM | POA: Diagnosis not present

## 2022-02-27 DIAGNOSIS — I2510 Atherosclerotic heart disease of native coronary artery without angina pectoris: Secondary | ICD-10-CM | POA: Diagnosis not present

## 2022-02-27 DIAGNOSIS — I4819 Other persistent atrial fibrillation: Secondary | ICD-10-CM | POA: Diagnosis not present

## 2022-02-27 DIAGNOSIS — E875 Hyperkalemia: Secondary | ICD-10-CM | POA: Diagnosis not present

## 2022-02-27 DIAGNOSIS — I5033 Acute on chronic diastolic (congestive) heart failure: Secondary | ICD-10-CM | POA: Diagnosis not present

## 2022-02-28 ENCOUNTER — Telehealth: Payer: Self-pay | Admitting: Family

## 2022-02-28 NOTE — Telephone Encounter (Signed)
Received labs results from [facility] dated 02/27/22: ? ?Sodium 141 ?Potassium 4.6 ?Creatinine 1.42 ?GFR 36 ? ?Patient normally takes 40mg  torsemide daily but was told to add an additional 20mg  daily until her weight / swelling normalizes. Renal function is slightly worse from nephrology appointment last week so advised [facility] nurse to have patient reduce her torsemide back to 40mg  daily. Nurse doesn't think she's been taking the extra 20mg  consistently anyways.  ? ?Diet is still an issue as she's eating canned soup and fast food meals. Nurse Aimee continues to discuss this with her.  ?

## 2022-03-01 DIAGNOSIS — E1151 Type 2 diabetes mellitus with diabetic peripheral angiopathy without gangrene: Secondary | ICD-10-CM | POA: Diagnosis not present

## 2022-03-01 DIAGNOSIS — I2510 Atherosclerotic heart disease of native coronary artery without angina pectoris: Secondary | ICD-10-CM | POA: Diagnosis not present

## 2022-03-01 DIAGNOSIS — I5033 Acute on chronic diastolic (congestive) heart failure: Secondary | ICD-10-CM | POA: Diagnosis not present

## 2022-03-01 DIAGNOSIS — J449 Chronic obstructive pulmonary disease, unspecified: Secondary | ICD-10-CM | POA: Diagnosis not present

## 2022-03-01 DIAGNOSIS — I4819 Other persistent atrial fibrillation: Secondary | ICD-10-CM | POA: Diagnosis not present

## 2022-03-01 DIAGNOSIS — I110 Hypertensive heart disease with heart failure: Secondary | ICD-10-CM | POA: Diagnosis not present

## 2022-03-04 ENCOUNTER — Ambulatory Visit
Admission: RE | Admit: 2022-03-04 | Discharge: 2022-03-04 | Disposition: A | Discharge status: Home / Self Care | Payer: Medicare Other | Source: Ambulatory Visit | Attending: Nephrology | Admitting: Nephrology

## 2022-03-04 ENCOUNTER — Other Ambulatory Visit: Payer: Self-pay

## 2022-03-04 DIAGNOSIS — N189 Chronic kidney disease, unspecified: Secondary | ICD-10-CM | POA: Insufficient documentation

## 2022-03-04 DIAGNOSIS — E1122 Type 2 diabetes mellitus with diabetic chronic kidney disease: Secondary | ICD-10-CM | POA: Insufficient documentation

## 2022-03-04 DIAGNOSIS — D631 Anemia in chronic kidney disease: Secondary | ICD-10-CM | POA: Diagnosis not present

## 2022-03-05 DIAGNOSIS — E1151 Type 2 diabetes mellitus with diabetic peripheral angiopathy without gangrene: Secondary | ICD-10-CM | POA: Diagnosis not present

## 2022-03-05 DIAGNOSIS — D649 Anemia, unspecified: Secondary | ICD-10-CM | POA: Diagnosis not present

## 2022-03-05 DIAGNOSIS — R54 Age-related physical debility: Secondary | ICD-10-CM | POA: Diagnosis not present

## 2022-03-05 DIAGNOSIS — I5033 Acute on chronic diastolic (congestive) heart failure: Secondary | ICD-10-CM | POA: Diagnosis not present

## 2022-03-05 DIAGNOSIS — H409 Unspecified glaucoma: Secondary | ICD-10-CM | POA: Diagnosis not present

## 2022-03-05 DIAGNOSIS — J449 Chronic obstructive pulmonary disease, unspecified: Secondary | ICD-10-CM | POA: Diagnosis not present

## 2022-03-05 DIAGNOSIS — I5030 Unspecified diastolic (congestive) heart failure: Secondary | ICD-10-CM | POA: Diagnosis not present

## 2022-03-05 DIAGNOSIS — I4819 Other persistent atrial fibrillation: Secondary | ICD-10-CM | POA: Diagnosis not present

## 2022-03-05 DIAGNOSIS — E1142 Type 2 diabetes mellitus with diabetic polyneuropathy: Secondary | ICD-10-CM | POA: Diagnosis not present

## 2022-03-05 DIAGNOSIS — I2510 Atherosclerotic heart disease of native coronary artery without angina pectoris: Secondary | ICD-10-CM | POA: Diagnosis not present

## 2022-03-05 DIAGNOSIS — I110 Hypertensive heart disease with heart failure: Secondary | ICD-10-CM | POA: Diagnosis not present

## 2022-03-05 DIAGNOSIS — E785 Hyperlipidemia, unspecified: Secondary | ICD-10-CM | POA: Diagnosis not present

## 2022-03-07 DIAGNOSIS — I2510 Atherosclerotic heart disease of native coronary artery without angina pectoris: Secondary | ICD-10-CM | POA: Diagnosis not present

## 2022-03-07 DIAGNOSIS — J449 Chronic obstructive pulmonary disease, unspecified: Secondary | ICD-10-CM | POA: Diagnosis not present

## 2022-03-07 DIAGNOSIS — I5033 Acute on chronic diastolic (congestive) heart failure: Secondary | ICD-10-CM | POA: Diagnosis not present

## 2022-03-07 DIAGNOSIS — I4819 Other persistent atrial fibrillation: Secondary | ICD-10-CM | POA: Diagnosis not present

## 2022-03-07 DIAGNOSIS — I110 Hypertensive heart disease with heart failure: Secondary | ICD-10-CM | POA: Diagnosis not present

## 2022-03-07 DIAGNOSIS — E1151 Type 2 diabetes mellitus with diabetic peripheral angiopathy without gangrene: Secondary | ICD-10-CM | POA: Diagnosis not present

## 2022-03-08 DIAGNOSIS — J449 Chronic obstructive pulmonary disease, unspecified: Secondary | ICD-10-CM | POA: Diagnosis not present

## 2022-03-08 DIAGNOSIS — I110 Hypertensive heart disease with heart failure: Secondary | ICD-10-CM | POA: Diagnosis not present

## 2022-03-08 DIAGNOSIS — I2510 Atherosclerotic heart disease of native coronary artery without angina pectoris: Secondary | ICD-10-CM | POA: Diagnosis not present

## 2022-03-08 DIAGNOSIS — I5033 Acute on chronic diastolic (congestive) heart failure: Secondary | ICD-10-CM | POA: Diagnosis not present

## 2022-03-08 DIAGNOSIS — I4819 Other persistent atrial fibrillation: Secondary | ICD-10-CM | POA: Diagnosis not present

## 2022-03-08 DIAGNOSIS — E1151 Type 2 diabetes mellitus with diabetic peripheral angiopathy without gangrene: Secondary | ICD-10-CM | POA: Diagnosis not present

## 2022-03-11 DIAGNOSIS — I5033 Acute on chronic diastolic (congestive) heart failure: Secondary | ICD-10-CM | POA: Diagnosis not present

## 2022-03-11 DIAGNOSIS — I2510 Atherosclerotic heart disease of native coronary artery without angina pectoris: Secondary | ICD-10-CM | POA: Diagnosis not present

## 2022-03-11 DIAGNOSIS — I4819 Other persistent atrial fibrillation: Secondary | ICD-10-CM | POA: Diagnosis not present

## 2022-03-11 DIAGNOSIS — J449 Chronic obstructive pulmonary disease, unspecified: Secondary | ICD-10-CM | POA: Diagnosis not present

## 2022-03-11 DIAGNOSIS — I110 Hypertensive heart disease with heart failure: Secondary | ICD-10-CM | POA: Diagnosis not present

## 2022-03-11 DIAGNOSIS — E1151 Type 2 diabetes mellitus with diabetic peripheral angiopathy without gangrene: Secondary | ICD-10-CM | POA: Diagnosis not present

## 2022-03-13 ENCOUNTER — Ambulatory Visit (INDEPENDENT_AMBULATORY_CARE_PROVIDER_SITE_OTHER): Payer: Medicare Other | Admitting: Internal Medicine

## 2022-03-13 ENCOUNTER — Telehealth: Payer: Self-pay | Admitting: Cardiovascular Disease

## 2022-03-13 ENCOUNTER — Encounter: Payer: Self-pay | Admitting: Internal Medicine

## 2022-03-13 VITALS — BP 110/58 | HR 73 | Ht 62.0 in | Wt 210.0 lb

## 2022-03-13 DIAGNOSIS — R0600 Dyspnea, unspecified: Secondary | ICD-10-CM | POA: Diagnosis not present

## 2022-03-13 DIAGNOSIS — I5033 Acute on chronic diastolic (congestive) heart failure: Secondary | ICD-10-CM | POA: Diagnosis not present

## 2022-03-13 DIAGNOSIS — I480 Paroxysmal atrial fibrillation: Secondary | ICD-10-CM | POA: Diagnosis not present

## 2022-03-13 DIAGNOSIS — I495 Sick sinus syndrome: Secondary | ICD-10-CM

## 2022-03-13 DIAGNOSIS — I4819 Other persistent atrial fibrillation: Secondary | ICD-10-CM | POA: Diagnosis not present

## 2022-03-13 DIAGNOSIS — I2510 Atherosclerotic heart disease of native coronary artery without angina pectoris: Secondary | ICD-10-CM | POA: Diagnosis not present

## 2022-03-13 DIAGNOSIS — I110 Hypertensive heart disease with heart failure: Secondary | ICD-10-CM | POA: Diagnosis not present

## 2022-03-13 DIAGNOSIS — E1151 Type 2 diabetes mellitus with diabetic peripheral angiopathy without gangrene: Secondary | ICD-10-CM | POA: Diagnosis not present

## 2022-03-13 DIAGNOSIS — J449 Chronic obstructive pulmonary disease, unspecified: Secondary | ICD-10-CM | POA: Diagnosis not present

## 2022-03-13 DIAGNOSIS — R001 Bradycardia, unspecified: Secondary | ICD-10-CM | POA: Diagnosis not present

## 2022-03-13 NOTE — Progress Notes (Signed)
?Cardiology Office Note:   ? ?Date:  03/13/2022  ? ?ID:  Jenny [name], DOB Feb 17, 1933, MRN [id number] ? ?PCP:  [facility]  ? ?[facility] Providers ?Cardiologist:  Alverda Skeans, MD ?Referring MD: [facility]  ? ?Chief Complaint/Reason for Referral: Bradycardia and dyspnea ? ?ASSESSMENT:   ? ?1. Tachy-brady syndrome (HCC)   ?2. PAF (paroxysmal atrial fibrillation) (HCC)   ?3. Dyspnea, unspecified type   ? ? ?PLAN:   ? ?In order of problems listed above: ? The patient has a history of atrial fibrillation and now has symptomatic bradycardia.  I reviewed the EKG with the EP staff.  She  ?is on no rate control agents.  I will refer the patient to electrophysiology for recommendations regarding possibly a pacemaker.  The patient is having shortness of breath and fatigue.  Her shortness of breath is not responding to augmented diuretics. ?2.  Continue Eliquis.  As detailed above the patient is not on rate controlling agents. ?3.  The patient will continue her diuretic regimen with occasional augmented doses of diuretics.  Her ejection fraction is normal. ? ? ?Dispo:  No follow-ups on file.  ? ?  ? ?Medication Adjustments/Labs and Tests Ordered: ?Current medicines are reviewed at length with the patient today.  Concerns regarding medicines are outlined above. ? ?The following changes have been made:  no change  ? ?Labs/tests ordered: ?No orders of the defined types were placed in this encounter. ? ? ?Medication Changes: ?No orders of the defined types were placed in this encounter. ? ? ? ?Current medicines are reviewed at length with the patient today.  The patient does not have concerns regarding medicines. ? ? ?History of Present Illness:   ? ?FOCUSED PROBLEM LIST:   ?1.  Coronary artery disease status post PCI right coronary artery in 2010 with moderate LAD stenosis ?2.  Hypertension ?3.  Hyperlipidemia ?4.  Mild carotid disease on Dopplers January 2022 ?5.  Atrial fibrillation on  apixaban; followed in atrial fibrillation clinic; cardioverted February 2023 ?6.  Ongoing tobacco abuse.   ?7.  Type 2 diabetes diet controlled ? ?The patient is a 88 y.o. female with the indicated medical history here for for an expedited clinic evaluation.  I am seeing the patient as the doctor of the day.  The patient typically sees Dr. Clifton James.  An [facility] staff member noted her heart rate to be 44 with a blood pressure 146/64.  She was also noted to be short of breath and quite fatigued.  For this reason she was referred for expedited evaluation today.  She underwent cardioversion in February and was noted to be in 2:1 block immediately after the procedure which was transient. ? ?The patient tells me she has been short of breath and fatigue over the last couple weeks.  She denies any significant chest pain.  She has had no presyncope or syncope.  She denies any paroxysmal nocturnal dyspnea.  Usually when she gets short of breath she takes an extra dose of her torsemide.  She did this on Monday with no relief of her symptoms.  She has had some nuisance nosebleeds on apixaban.  She has had no other severe bleeding.  She has had no signs or symptoms of stroke. ? ?Her EKG here today demonstrates sinus bradycardia with Mobitz second degree type I block ? ? ? ?Current Medications: ?Current Meds  ?Medication Sig  ? albuterol (VENTOLIN HFA) 108 (90 Base) MCG/ACT inhaler INHALE ONE PUFF  EVERY 6 HOURS AS NEEDED FOR SHORTNESS OF BREATH  ? apixaban (ELIQUIS) 5 MG TABS tablet Take 1 tablet (5 mg total) by mouth 2 (two) times daily.  ? dorzolamide (TRUSOPT) 2 % ophthalmic solution Place 1 drop into both eyes 2 (two) times daily.  ? famotidine (PEPCID) 20 MG tablet Take 20 mg by mouth 2 (two) times daily as needed for heartburn.  ? Fluticasone-Umeclidin-Vilant (TRELEGY ELLIPTA) 100-62.5-25 MCG/INH AEPB Inhale 1 puff into the lungs daily.  ? gabapentin (NEURONTIN) 100 MG capsule Take 100-200 mg by mouth See admin  instructions. Take 1 capsule (100mg ) by mouth every morning and take 2 capsules (200mg ) by mouth at bedtime  ? ipratropium-albuterol (DUONEB) 0.5-2.5 (3) MG/3ML SOLN Take 3 mLs by nebulization every 6 (six) hours as needed.  ? latanoprost (XALATAN) 0.005 % ophthalmic solution Place 1 drop into both eyes in the morning.  ? magnesium hydroxide (MILK OF MAGNESIA) 400 MG/5ML suspension Take 30 mLs by mouth as needed for mild constipation.  ? NITROSTAT 0.4 MG SL tablet DISSOLVE ONE TABLET UNDER TONGUE AS NEEDED FOR CHEST PAIN - MAY REPEAT TWICE-IF NO RELIEF GO TO NEAREST HOSPITAL ER  ? nystatin (MYCOSTATIN/NYSTOP) powder Apply 1 application topically daily.  ? rosuvastatin (CRESTOR) 20 MG tablet Take 20 mg by mouth daily.  ? sacubitril-valsartan (ENTRESTO) 24-26 MG Take 1 tablet by mouth 2 (two) times daily.  ? torsemide 40 MG TABS Take 40 mg by mouth daily. And additional 20mg  if needed for weight gain, shortness of breath or swelling  ?  ? ?Allergies:    ?Aspirin, Atorvastatin, Cyclobenzaprine, Lactose intolerance (gi), Meperidine hcl, Pravastatin, Propoxyphene, and Wound dressing adhesive  ? ?Social History:   ?Social History  ? ?Tobacco Use  ? Smoking status: Former  ?  Packs/day: 0.50  ?  Years: 60.00  ?  Pack years: 30.00  ?  Types: Cigarettes  ?  Quit date: 11/12/2021  ?  Years since quitting: 0.3  ? Smokeless tobacco: Never  ? Tobacco comments:  ?  She lives in [place] with her significant other Hook)0  ?Vaping Use  ? Vaping Use: Never used  ?Substance Use Topics  ? Alcohol use: Yes  ?  Alcohol/week: 1.0 - 2.0 standard drink  ?  Types: 1 - 2 Glasses of wine per week  ?  Comment: once or twice a year glass of wine 12/25/21  ? Drug use: No  ?  ? ?Family Hx: ?Family History  ?Problem Relation Age of Onset  ? Hypertension Mother   ? Ulcers Mother   ? Stomach cancer Father   ? Stroke Maternal Grandmother   ? Heart attack Neg Hx   ?  ? ?Review of Systems:   ?Please see the history of present illness.    ?All other  systems reviewed and are negative. ?  ? ? ?EKGs/Labs/Other Test Reviewed:   ? ?EKG:  EKG is ordered today that I personally reviewed demonstrates sinus bradycardia with Mobitz second-degree type I block. ? ?Prior CV studies: ?November 2020 to monitor demonstrating atrial fibrillation ? ?November 2022 echocardiogram demonstrates an ejection fraction of 60 to 65% with estimated right ventricular systolic pressure of 65 mmHg ? ? ?Recent Labs: ?12/07/2021: B Natriuretic Peptide 371.3 ?01/03/2022: ALT 24; Magnesium 2.2 ?01/30/2022: BUN 41; Creatinine, Ser 1.24; Hemoglobin 14.2; Platelets 247; Potassium 4.0; Sodium 135  ? ?Recent Lipid Panel ?Lab Results  ?Component Value Date/Time  ? CHOL 125 10/18/2021 11:39 AM  ? TRIG 163 (H) 10/18/2021 11:39 AM  ?  HDL 55 10/18/2021 11:39 AM  ? LDLCALC 43 10/18/2021 11:39 AM  ? LDLDIRECT 153.2 01/20/2014 09:50 AM  ? ? ? ?Physical Exam:   ? ?VS:  BP (!) 110/58   Pulse 73   Ht 5\' 2"  (1.575 m)   Wt 210 lb (95.3 kg)   SpO2 97%   BMI 38.41 kg/m?    ?Wt Readings from Last 3 Encounters:  ?03/13/22 210 lb (95.3 kg)  ?02/07/22 203 lb 12.8 oz (92.4 kg)  ?01/30/22 194 lb 3.2 oz (88.1 kg)  ?  ?GENERAL:  No apparent distress, AOx3 ?HEENT:  No carotid bruits, +2 carotid impulses, no scleral icterus ?CAR: Irregular no murmurs, gallops, rubs, or thrills ?RES:  Clear to auscultation bilaterally ?ABD:  Soft, nontender, nondistended, positive bowel sounds x 4 ?VASC:  +2 radial pulses, +2 carotid pulses ?NEURO:  CN 2-12 grossly intact; motor and sensory grossly intact ?PSYCH:  No active depression or anxiety ?EXT:  +1 edema ? ? ?Signed, ?Orbie PyoArun K Swanson Farnell, MD  ?03/13/2022 4:59 PM    ?[facility] ?[address], [place], [place]  [address] ?Phone: [phone]; Fax: [phone]  ? ?Note:  This document was prepared using Dragon voice recognition software and may include unintentional dictation errors. ?

## 2022-03-13 NOTE — Telephone Encounter (Signed)
STAT if HR is under 50 or over 120 ?(normal HR is 60-100 beats per minute) ? ?What is your heart rate? Jenny [name] with [facility] called and stated her heart rate right now is 44 ?146/64 ? ?Do you have a log of your heart rate readings (document readings)? No  ? ?Do you have any other symptoms? She stated she is tired and very low energy.  She is a little SOB  ? ?Best number [phone]  ?

## 2022-03-13 NOTE — Patient Instructions (Signed)
Medication Instructions:  ?No changes ?*If you need a refill on your cardiac medications before your next appointment, please call your pharmacy* ? ? ?Lab Work: ?none ?If you have labs (blood work) drawn today and your tests are completely normal, you will receive your results only by: ?MyChart Message (if you have MyChart) OR ?A paper copy in the mail ?If you have any lab test that is abnormal or we need to change your treatment, we will call you to review the results. ? ? ?Testing/Procedures: ?none ? ? ?Follow-Up: ? ? ?Other Instructions ?You have been referred to Cardiac Electrophysiology.  We will contact you with an appointment. ?  ?

## 2022-03-13 NOTE — Telephone Encounter (Signed)
Spoke with [facility] nurse who reports pt HR 44. BP 146/64.  Pt feels very tired has no energy.  Nurse reports pt is SOB with increased weight gain. Reports pt baseline weight is 196 lbs (Jan 31, 2022) today weight is 206.  Pt will bring daily weight log to OV today.  Pt took extra dose of torsemide on Sunday 03/10/22.  Scheduled pt on DOD schedule with Dr.  Lynnette Caffey for today at 4:30 pm.  ? ?

## 2022-03-14 ENCOUNTER — Other Ambulatory Visit: Payer: Self-pay | Admitting: Home Health

## 2022-03-14 ENCOUNTER — Telehealth: Payer: Self-pay | Admitting: Family

## 2022-03-14 NOTE — Telephone Encounter (Signed)
Lab results received from home health nurse from 03/13/22 ? ?Sodium 140 ?Potassium 5.1 ?Creatinine 1.17 ?GFR 45 ?BNP 2807 ? ?Saw cardiology yesterday and has type 2 block with bradycardia. Has EP appointment scheduled for possible pacemaker. Will continue to supplement diuresis as needed for weight gain/ symptoms.  ?

## 2022-03-17 DIAGNOSIS — I2510 Atherosclerotic heart disease of native coronary artery without angina pectoris: Secondary | ICD-10-CM | POA: Diagnosis not present

## 2022-03-17 DIAGNOSIS — I4819 Other persistent atrial fibrillation: Secondary | ICD-10-CM | POA: Diagnosis not present

## 2022-03-17 DIAGNOSIS — J449 Chronic obstructive pulmonary disease, unspecified: Secondary | ICD-10-CM | POA: Diagnosis not present

## 2022-03-17 DIAGNOSIS — I110 Hypertensive heart disease with heart failure: Secondary | ICD-10-CM | POA: Diagnosis not present

## 2022-03-17 DIAGNOSIS — I5033 Acute on chronic diastolic (congestive) heart failure: Secondary | ICD-10-CM | POA: Diagnosis not present

## 2022-03-17 DIAGNOSIS — E1151 Type 2 diabetes mellitus with diabetic peripheral angiopathy without gangrene: Secondary | ICD-10-CM | POA: Diagnosis not present

## 2022-03-19 DIAGNOSIS — E1142 Type 2 diabetes mellitus with diabetic polyneuropathy: Secondary | ICD-10-CM | POA: Diagnosis not present

## 2022-03-19 DIAGNOSIS — Z7901 Long term (current) use of anticoagulants: Secondary | ICD-10-CM | POA: Diagnosis not present

## 2022-03-19 DIAGNOSIS — I5030 Unspecified diastolic (congestive) heart failure: Secondary | ICD-10-CM | POA: Diagnosis not present

## 2022-03-19 DIAGNOSIS — Z7189 Other specified counseling: Secondary | ICD-10-CM | POA: Diagnosis not present

## 2022-03-19 DIAGNOSIS — J449 Chronic obstructive pulmonary disease, unspecified: Secondary | ICD-10-CM | POA: Diagnosis not present

## 2022-03-19 DIAGNOSIS — I4819 Other persistent atrial fibrillation: Secondary | ICD-10-CM | POA: Diagnosis not present

## 2022-03-24 DIAGNOSIS — I4819 Other persistent atrial fibrillation: Secondary | ICD-10-CM | POA: Diagnosis not present

## 2022-03-24 DIAGNOSIS — I2510 Atherosclerotic heart disease of native coronary artery without angina pectoris: Secondary | ICD-10-CM | POA: Diagnosis not present

## 2022-03-24 DIAGNOSIS — I5033 Acute on chronic diastolic (congestive) heart failure: Secondary | ICD-10-CM | POA: Diagnosis not present

## 2022-03-24 DIAGNOSIS — E1151 Type 2 diabetes mellitus with diabetic peripheral angiopathy without gangrene: Secondary | ICD-10-CM | POA: Diagnosis not present

## 2022-03-24 DIAGNOSIS — I110 Hypertensive heart disease with heart failure: Secondary | ICD-10-CM | POA: Diagnosis not present

## 2022-03-24 DIAGNOSIS — J449 Chronic obstructive pulmonary disease, unspecified: Secondary | ICD-10-CM | POA: Diagnosis not present

## 2022-03-25 ENCOUNTER — Encounter: Payer: Self-pay | Admitting: Cardiovascular Disease

## 2022-03-25 ENCOUNTER — Ambulatory Visit (INDEPENDENT_AMBULATORY_CARE_PROVIDER_SITE_OTHER): Payer: Medicare Other | Admitting: Cardiovascular Disease

## 2022-03-25 VITALS — BP 122/60 | HR 52 | Ht 62.0 in | Wt 208.6 lb

## 2022-03-25 DIAGNOSIS — I495 Sick sinus syndrome: Secondary | ICD-10-CM | POA: Diagnosis not present

## 2022-03-25 DIAGNOSIS — Z720 Tobacco use: Secondary | ICD-10-CM

## 2022-03-25 DIAGNOSIS — I2510 Atherosclerotic heart disease of native coronary artery without angina pectoris: Secondary | ICD-10-CM | POA: Diagnosis not present

## 2022-03-25 DIAGNOSIS — I10 Essential (primary) hypertension: Secondary | ICD-10-CM | POA: Diagnosis not present

## 2022-03-25 DIAGNOSIS — I480 Paroxysmal atrial fibrillation: Secondary | ICD-10-CM

## 2022-03-25 DIAGNOSIS — E7800 Pure hypercholesterolemia, unspecified: Secondary | ICD-10-CM | POA: Diagnosis not present

## 2022-03-25 NOTE — Progress Notes (Signed)
? ?Chief Complaint  ?Patient presents with  ? Follow-up  ?  CAD, diastolic CHF, tachy/brady  ? ?History of Present Illness: 88 yo female with history of CAD, HTN, HLD, COPD, tobacco abuse, carotid artery disease, Mobitz 1 heart block, chronic diastolic CHF, atrial fibrillation and DM here today cardiac follow up. In July of 2010 she had an inferior MI treated with a drug eluting stent in the right coronary artery. She had residual 70% LAD stenosis and an ejection fraction of 60%. She also has chronic shortness of breath and moderate chronic obstructive pulmonary disease by pulmonary function testing. She has continued to smoke despite advice against this.  Stress myoview 12/20/13 with no ischemia, normal LV function. Mild bilateral carotid artery disease by dopplers in January 2020. She was hospitalized in September 2022 with COPD exacerbation  and acute on chronic diastolic CHF. Echo September 2022 with LVEF=60-65%. She has since been followed in our Advance Heart Failure office at [facility]. She was found to be in atrial fibrillation during that admission. She was diuresed with IV Lasix. She is now on Eliquis. She was found to be bradycardic and toprol was stopped. She had been on ASA and Plavix but both have been stopped. Cardiac monitor October 2022 with 100% atrial fibrillation. She has been seen in the atrial fib clinic. She was admitted to [facility] 11/03/21 with a CHF and COPD exacerbation. She was cardioverted to sinus rhythm in February 2022 and immediately noted to have transient 2:1 AV block. She was seen in our office 03/13/22 by Dr. Ali [name] with dyspnea and fatigue and reported heart rate in the 40s at home. EKG with Mobitz 1 AV block. She was referred to see EP and is scheduled to see Dr. Caryl [name] later this week.  ? ?She is here today for follow up. She has no change in her baseline dyspnea and fatigue. The patient denies any chest pain, palpitations, lower extremity edema, orthopnea, PND, dizziness, near syncope  or syncope.  ?  ?Primary Care Physician: Jenny [name] ? ?Past Medical History:  ?Diagnosis Date  ? ACUT DUOD ULCER W/HEMORR&PERF W/O MENTION OBST 10/05/2009  ? NSAID induced  ? ALLERGIC RHINITIS CAUSE UNSPECIFIED   ? ANEMIA-NOS   ? CAD (coronary artery disease) 06/08/2009  ? DEs RCA with 70% LAD and EF 60%  ? CHF (congestive heart failure) ([place])   ? COPD   ? mild obst on PFTs 03/2010  ? Diabetes mellitus 06/2010 dx  ? Mild, diet controlled  ? GLAUCOMA   ? HYPERLIPIDEMIA   ? HYPERTENSION, BENIGN   ? MYOCARDIAL INFARCTION 06/08/2009  ? des to rca  ? Persistent atrial fibrillation ([place])   ? Dx 08/2021  ? TOBACCO ABUSE   ? ? ?Past Surgical History:  ?Procedure Laterality Date  ? CARDIOVERSION N/A 01/30/2022  ? Procedure: CARDIOVERSION;  Surgeon: Jenny Headings, MD;  Location: [place];  Service: Cardiovascular;  Laterality: N/A;  ? [place]  ? Right knee surgery    ? ? ?Current Outpatient Medications  ?Medication Sig Dispense Refill  ? albuterol (VENTOLIN HFA) 108 (90 Base) MCG/ACT inhaler INHALE ONE PUFF EVERY 6 HOURS AS NEEDED FOR SHORTNESS OF BREATH 18 g 2  ? apixaban (ELIQUIS) 5 MG TABS tablet Take 1 tablet (5 mg total) by mouth 2 (two) times daily. 60 tablet 11  ? dorzolamide (TRUSOPT) 2 % ophthalmic solution Place 1 drop into both eyes 2 (two) times daily.    ? famotidine (PEPCID) 20 MG  tablet Take 20 mg by mouth 2 (two) times daily as needed for heartburn.    ? Fluticasone-Umeclidin-Vilant (TRELEGY ELLIPTA) 100-62.5-25 MCG/INH AEPB Inhale 1 puff into the lungs daily. 1 each 1  ? gabapentin (NEURONTIN) 100 MG capsule Take 100-200 mg by mouth See admin instructions. Take 1 capsule (100mg ) by mouth every morning and take 2 capsules (200mg ) by mouth at bedtime    ? ipratropium-albuterol (DUONEB) 0.5-2.5 (3) MG/3ML SOLN Take 3 mLs by nebulization every 6 (six) hours as needed. 360 mL 1  ? latanoprost (XALATAN) 0.005 % ophthalmic solution Place 1 drop into both eyes in the morning.     ? magnesium hydroxide (MILK OF MAGNESIA) 400 MG/5ML suspension Take 30 mLs by mouth as needed for mild constipation.    ? NITROSTAT 0.4 MG SL tablet DISSOLVE ONE TABLET UNDER TONGUE AS NEEDED FOR CHEST PAIN - MAY REPEAT TWICE-IF NO RELIEF GO TO NEAREST HOSPITAL ER 25 tablet 0  ? nystatin (MYCOSTATIN/NYSTOP) powder Apply 1 application topically daily.    ? rosuvastatin (CRESTOR) 20 MG tablet Take 20 mg by mouth daily.    ? sacubitril-valsartan (ENTRESTO) 24-26 MG Take 1 tablet by mouth 2 (two) times daily. 60 tablet 3  ? torsemide 40 MG TABS Take 40 mg by mouth daily. And additional 20mg  if needed for weight gain, shortness of breath or swelling 40 tablet 5  ? ?No current facility-administered medications for this visit.  ? ? ?Allergies  ?Allergen Reactions  ? Aspirin Other (See Comments)  ?  Can take 81 mg not 325 mg  ? Atorvastatin Itching  ?  Itching and myaliga  ? Cyclobenzaprine   ?  Other reaction(s): muscle relaxers-ulcer hemorrhage (hospitalized)  ? Lactose Intolerance (Gi) Other (See Comments)  ?  GI upset  ? Meperidine Hcl Other (See Comments)  ?  Not known  ? Pravastatin Rash  ?  rash  ? Propoxyphene   ?  Other reaction(s): pain meds, hallucination, vomiting  ? Wound Dressing Adhesive   ?  Other reaction(s): red, stings  ? ? ?Social History  ? ?Socioeconomic History  ? Marital status: Widowed  ?  Spouse name: Not on file  ? Number of children: Not on file  ? Years of education: Not on file  ? Highest education level: Not on file  ?Occupational History  ? Not on file  ?Tobacco Use  ? Smoking status: Former  ?  Packs/day: 0.50  ?  Years: 60.00  ?  Pack years: 30.00  ?  Types: Cigarettes  ?  Quit date: 11/12/2021  ?  Years since quitting: 0.3  ? Smokeless tobacco: Never  ? Tobacco comments:  ?  She lives in [place] with her significant other Jenny [name])0  ?Vaping Use  ? Vaping Use: Never used  ?Substance and Sexual Activity  ? Alcohol use: Yes  ?  Alcohol/week: 1.0 - 2.0 standard drink  ?  Types: 1 - 2 Glasses  of wine per week  ?  Comment: once or twice a year glass of wine 12/25/21  ? Drug use: No  ? Sexual activity: Not on file  ?Other Topics Concern  ? Not on file  ?Social History Narrative  ? Lives in [place] with her SO - charles [name]  ? Retired -Catering manager  ? Enjoys travel - live theater/shows  ? ?Social Determinants of Health  ? ?Financial Resource Strain: Not on file  ?Food Insecurity: Not on file  ?Transportation Needs: Not on file  ?Physical Activity:  Not on file  ?Stress: Not on file  ?Social Connections: Not on file  ?Intimate Partner Violence: Not on file  ? ? ?Family History  ?Problem Relation Age of Onset  ? Hypertension Mother   ? Ulcers Mother   ? Stomach cancer Father   ? Stroke Maternal Grandmother   ? Heart attack Neg Hx   ? ? ?Review of Systems:  As stated in the HPI and otherwise negative.  ? ?BP 122/60   Pulse (!) 52   Ht 5\' 2"  (1.575 m)   Wt 208 lb 9.6 oz (94.6 kg)   SpO2 98%   BMI 38.15 kg/m?  ? ?Physical Examination: ? ?General: Well developed, well nourished, NAD  ?HEENT: OP clear, mucus membranes moist  ?SKIN: warm, dry. No rashes. ?Neuro: No focal deficits  ?Musculoskeletal: Muscle strength 5/5 all ext  ?Psychiatric: Mood and affect normal  ?Neck: No JVD, no carotid bruits, no thyromegaly, no lymphadenopathy.  ?Lungs:Clear bilaterally, no wheezes, rhonci, crackles ?Cardiovascular: Regular rate and rhythm. No murmurs, gallops or rubs. ?Abdomen:Soft. Bowel sounds present. Non-tender.  ?Extremities: No lower extremity edema. Pulses are 2 + in the bilateral DP/PT. ? ? ?EKG:  EKG is not ordered today ?The ekg ordered today demonstrates   ? ?Recent Labs: ?12/07/2021: B Natriuretic Peptide 371.3 ?01/03/2022: ALT 24; Magnesium 2.2 ?01/30/2022: BUN 41; Creatinine, Ser 1.24; Hemoglobin 14.2; Platelets 247; Potassium 4.0; Sodium 135  ?  ?Wt Readings from Last 3 Encounters:  ?03/25/22 208 lb 9.6 oz (94.6 kg)  ?03/13/22 210 lb (95.3 kg)  ?02/07/22 203 lb 12.8 oz (92.4 kg)  ?  ? ?Other  studies Reviewed: ?Additional studies/ records that were reviewed today include: . ?Review of the above records demonstrates:  ? ?Assessment and Plan:  ? ?1. CAD without angina: She has been known to have a

## 2022-03-25 NOTE — Patient Instructions (Signed)
Medication Instructions:  ?Continue current medications for now.  Fill out and return Entresto and Eliquis Patient Assistance forms.  ?*If you need a refill on your cardiac medications before your next appointment, please call your pharmacy* ? ? ?Lab Work: ?none ?If you have labs (blood work) drawn today and your tests are completely normal, you will receive your results only by: ?MyChart Message (if you have MyChart) OR ?A paper copy in the mail ?If you have any lab test that is abnormal or we need to change your treatment, we will call you to review the results. ? ? ?Testing/Procedures: ?none ? ? ?Follow-Up: ?3-4 weeks with Dr. Clifton James ? ?Important Information About Sugar ? ? ? ? ?  ?

## 2022-03-26 ENCOUNTER — Telehealth: Payer: Self-pay | Admitting: Cardiovascular Disease

## 2022-03-26 DIAGNOSIS — E669 Obesity, unspecified: Secondary | ICD-10-CM | POA: Diagnosis not present

## 2022-03-26 DIAGNOSIS — Z7901 Long term (current) use of anticoagulants: Secondary | ICD-10-CM | POA: Diagnosis not present

## 2022-03-26 DIAGNOSIS — J449 Chronic obstructive pulmonary disease, unspecified: Secondary | ICD-10-CM | POA: Diagnosis not present

## 2022-03-26 DIAGNOSIS — Z7982 Long term (current) use of aspirin: Secondary | ICD-10-CM | POA: Diagnosis not present

## 2022-03-26 DIAGNOSIS — I6523 Occlusion and stenosis of bilateral carotid arteries: Secondary | ICD-10-CM | POA: Diagnosis not present

## 2022-03-26 DIAGNOSIS — E1151 Type 2 diabetes mellitus with diabetic peripheral angiopathy without gangrene: Secondary | ICD-10-CM | POA: Diagnosis not present

## 2022-03-26 DIAGNOSIS — I110 Hypertensive heart disease with heart failure: Secondary | ICD-10-CM | POA: Diagnosis not present

## 2022-03-26 DIAGNOSIS — H409 Unspecified glaucoma: Secondary | ICD-10-CM | POA: Diagnosis not present

## 2022-03-26 DIAGNOSIS — I872 Venous insufficiency (chronic) (peripheral): Secondary | ICD-10-CM | POA: Diagnosis not present

## 2022-03-26 DIAGNOSIS — I5033 Acute on chronic diastolic (congestive) heart failure: Secondary | ICD-10-CM | POA: Diagnosis not present

## 2022-03-26 DIAGNOSIS — I4819 Other persistent atrial fibrillation: Secondary | ICD-10-CM | POA: Diagnosis not present

## 2022-03-26 DIAGNOSIS — F17210 Nicotine dependence, cigarettes, uncomplicated: Secondary | ICD-10-CM | POA: Diagnosis not present

## 2022-03-26 DIAGNOSIS — E785 Hyperlipidemia, unspecified: Secondary | ICD-10-CM | POA: Diagnosis not present

## 2022-03-26 DIAGNOSIS — I2510 Atherosclerotic heart disease of native coronary artery without angina pectoris: Secondary | ICD-10-CM | POA: Diagnosis not present

## 2022-03-26 NOTE — Telephone Encounter (Signed)
Son is calling in bout the financial assistant paperwork for Eliquis unable to upload to [facility]. Please advise ?

## 2022-03-27 ENCOUNTER — Other Ambulatory Visit: Payer: Self-pay | Admitting: Family

## 2022-03-27 NOTE — Telephone Encounter (Signed)
**Note De-Identified Jenny [name] Obfuscation** I gave the pts son Casimiro [facility]) my e-mail address so he can e-mail the pts [email or web address] application for Eliquis to me as he states that [facility] will not let him attach all 16 pages of her application in a message. ? ?He e-mailed the pts application to me while we were talking. ? ?I have printed the application, completed the providers page, and have e-mailed all to Dr Gibson Ramp nurse so she can obtain his signature and to fax all to [facility] at the fax number written on the cover letter included. ?

## 2022-03-27 NOTE — Telephone Encounter (Signed)
Pt assistance forms signed.  Will fax to [employer].  ?

## 2022-03-28 ENCOUNTER — Encounter: Payer: Self-pay | Admitting: Internal Medicine

## 2022-03-28 ENCOUNTER — Ambulatory Visit (INDEPENDENT_AMBULATORY_CARE_PROVIDER_SITE_OTHER): Payer: Medicare Other | Admitting: Internal Medicine

## 2022-03-28 VITALS — BP 140/70 | HR 56 | Ht 62.0 in | Wt 210.0 lb

## 2022-03-28 DIAGNOSIS — I495 Sick sinus syndrome: Secondary | ICD-10-CM | POA: Diagnosis not present

## 2022-03-28 DIAGNOSIS — I4819 Other persistent atrial fibrillation: Secondary | ICD-10-CM | POA: Diagnosis not present

## 2022-03-28 NOTE — Patient Instructions (Signed)
Medication Instructions:  Your physician recommends that you continue on your current medications as directed. Please refer to the Current Medication list given to you today.  *If you need a refill on your cardiac medications before your next appointment, please call your pharmacy*   Lab Work: None ordered.  If you have labs (blood work) drawn today and your tests are completely normal, you will receive your results only by: MyChart Message (if you have MyChart) OR A paper copy in the mail If you have any lab test that is abnormal or we need to change your treatment, we will call you to review the results.   Testing/Procedures: None ordered.    Follow-Up: At [facility], you and your health needs are our priority.  As part of our continuing mission to provide you with exceptional heart care, we have created designated Provider Care Teams.  These Care Teams include your primary Cardiologist (physician) and Advanced Practice Providers (APPs -  Physician Assistants and Nurse Practitioners) who all work together to provide you with the care you need, when you need it.  We recommend signing up for the patient portal called "MyChart".  Sign up information is provided on this After Visit Summary.  MyChart is used to connect with patients for Virtual Visits (Telemedicine).  Patients are able to view lab/test results, encounter notes, upcoming appointments, etc.  Non-urgent messages can be sent to your provider as well.   To learn more about what you can do with MyChart, go to [email or web address].    Your next appointment:   Follow up with Dr Klein as needed  Important Information About Sugar       

## 2022-03-28 NOTE — Progress Notes (Signed)
? ? ? ?ELECTROPHYSIOLOGY CONSULT NOTE  ?Patient ID: Jenny [name], MRN: [id number], DOB/AGE: 1933/07/21 88 y.o. ?Admit date: (Not on file) ?Date of Consult: 03/28/2022 ? ?Primary Physician: [facility] ?Primary Cardiologist: [facility] ?  ?  ?Jenny [name] is a 88 y.o. female who is being seen today for the evaluation of bradycardia at the request of Dr. Angelena Form.  ? ? ?HPI ?[name] is a 88 y.o. female referred for consideration of pacing because of bradycardia associated with Mobitz heart block. ? ? ?history of atrial fibrillation 9/22 for which she underwent cardioversion ?Heart rate was 56 at the time of cardioversion 2/23 and post cardioversion had Mobitz 1 second-degree AV block with a ventricular rate overall about 60 ?ECG 3/23 demonstrated sinus rhythm at 90 with profound first-degree AV block at about 450 ms ?ECG 4/23 sinus rhythm at 75 with 4: 3 alternating with 3: 2 Mobitz 1 second-degree AV block ? ?Functional status is less good than it was 1 or 2 years ago marked primarily by fatigue and some dyspnea.  No chest pain. ? ?She has chronic dyspnea with COPD. ? ?coronary artery disease with IMI 7/10 ? ? ?DATE TEST EF   ?1/15 Myoview    No ischemic  ?9/22 Echo  60-65%   ?     ? ?Date Cr K Hgb  ?2/23 0.24 4.0 13.8  ?     ? ? ? ? ?Past Medical History:  ?Diagnosis Date  ? ACUT DUOD ULCER W/HEMORR&PERF W/O MENTION OBST 10/05/2009  ? NSAID induced  ? ALLERGIC RHINITIS CAUSE UNSPECIFIED   ? ANEMIA-NOS   ? CAD (coronary artery disease) 06/08/2009  ? DEs RCA with 70% LAD and EF 60%  ? CHF (congestive heart failure) ([place])   ? COPD   ? mild obst on PFTs 03/2010  ? Diabetes mellitus 06/2010 dx  ? Mild, diet controlled  ? GLAUCOMA   ? HYPERLIPIDEMIA   ? HYPERTENSION, BENIGN   ? MYOCARDIAL INFARCTION 06/08/2009  ? des to rca  ? Persistent atrial fibrillation ([place])   ? Dx 08/2021  ? TOBACCO ABUSE   ?   ? ?Surgical History:  ?Past Surgical History:  ?Procedure Laterality Date  ? CARDIOVERSION N/A 01/30/2022  ?  Procedure: CARDIOVERSION;  Surgeon: Thayer Headings, MD;  Location: [place];  Service: Cardiovascular;  Laterality: N/A;  ? [place]  ? Right knee surgery    ?  ? ?Home Meds: ?Current Meds  ?Medication Sig  ? albuterol (VENTOLIN HFA) 108 (90 Base) MCG/ACT inhaler INHALE ONE PUFF EVERY 6 HOURS AS NEEDED FOR SHORTNESS OF BREATH  ? apixaban (ELIQUIS) 5 MG TABS tablet Take 1 tablet (5 mg total) by mouth 2 (two) times daily.  ? dorzolamide (TRUSOPT) 2 % ophthalmic solution Place 1 drop into both eyes 2 (two) times daily.  ? ENTRESTO 24-26 MG TAKE ONE TABLET TWICE DAILY ( DISCONTINUE LOSARTAN)  ? famotidine (PEPCID) 20 MG tablet Take 20 mg by mouth 2 (two) times daily as needed for heartburn.  ? Fluticasone-Umeclidin-Vilant (TRELEGY ELLIPTA) 100-62.5-25 MCG/INH AEPB Inhale 1 puff into the lungs daily.  ? gabapentin (NEURONTIN) 100 MG capsule Take 100-200 mg by mouth See admin instructions. Take 1 capsule (100mg ) by mouth every morning and take 2 capsules (200mg ) by mouth at bedtime  ? ipratropium-albuterol (DUONEB) 0.5-2.5 (3) MG/3ML SOLN Take 3 mLs by nebulization every 6 (six) hours as needed.  ? latanoprost (XALATAN) 0.005 % ophthalmic solution Place 1 drop into  both eyes in the morning.  ? magnesium hydroxide (MILK OF MAGNESIA) 400 MG/5ML suspension Take 30 mLs by mouth as needed for mild constipation.  ? NITROSTAT 0.4 MG SL tablet DISSOLVE ONE TABLET UNDER TONGUE AS NEEDED FOR CHEST PAIN - MAY REPEAT TWICE-IF NO RELIEF GO TO NEAREST HOSPITAL ER  ? nystatin (MYCOSTATIN/NYSTOP) powder Apply 1 application topically daily.  ? rosuvastatin (CRESTOR) 20 MG tablet Take 20 mg by mouth daily.  ? ? ?Allergies:  ?Allergies  ?Allergen Reactions  ? Aspirin Other (See Comments)  ?  Can take 81 mg not 325 mg  ? Atorvastatin Itching  ?  Itching and myaliga  ? Cyclobenzaprine   ?  Other reaction(s): muscle relaxers-ulcer hemorrhage (hospitalized)  ? Lactose Intolerance (Gi) Other (See Comments)  ?  GI upset  ?  Meperidine Hcl Other (See Comments)  ?  Not known  ? Pravastatin Rash  ?  rash  ? Propoxyphene   ?  Other reaction(s): pain meds, hallucination, vomiting  ? Wound Dressing Adhesive   ?  Other reaction(s): red, stings  ? ? ?Social History  ? ?Socioeconomic History  ? Marital status: Widowed  ?  Spouse name: Not on file  ? Number of children: Not on file  ? Years of education: Not on file  ? Highest education level: Not on file  ?Occupational History  ? Not on file  ?Tobacco Use  ? Smoking status: Former  ?  Packs/day: 0.50  ?  Years: 60.00  ?  Pack years: 30.00  ?  Types: Cigarettes  ?  Quit date: 11/12/2021  ?  Years since quitting: 0.3  ? Smokeless tobacco: Never  ? Tobacco comments:  ?  She lives in [place] with her significant other Juanda [place])0  ?Vaping Use  ? Vaping Use: Never used  ?Substance and Sexual Activity  ? Alcohol use: Yes  ?  Alcohol/week: 1.0 - 2.0 standard drink  ?  Types: 1 - 2 Glasses of wine per week  ?  Comment: once or twice a year glass of wine 12/25/21  ? Drug use: No  ? Sexual activity: Not on file  ?Other Topics Concern  ? Not on file  ?Social History Narrative  ? Lives in [place] with her SO - charles hook  ? Retired -Catering manager  ? Enjoys travel - live theater/shows  ? ?Social Determinants of Health  ? ?Financial Resource Strain: Not on file  ?Food Insecurity: Not on file  ?Transportation Needs: Not on file  ?Physical Activity: Not on file  ?Stress: Not on file  ?Social Connections: Not on file  ?Intimate Partner Violence: Not on file  ?  ? ?Family History  ?Problem Relation Age of Onset  ? Hypertension Mother   ? Ulcers Mother   ? Stomach cancer Father   ? Stroke Maternal Grandmother   ? Heart attack Neg Hx   ?  ? ?ROS:  Please see the history of present illness.     All other systems reviewed and negative.  ? ? ?Physical Exam: ?Blood pressure 140/70, pulse (!) 56, height 5\' 2"  (1.575 m), weight 210 lb (95.3 kg), SpO2 97 %. ?General: Well developed, well nourished female in  no acute distress. ?Head: Normocephalic, atraumatic, sclera non-icteric, no xanthomas, nares are without discharge. ?EENT: normal  ?Lymph Nodes:  none ?Neck: Negative for carotid bruits. JVD not elevated. ?Back:without scoliosis kyphosis ?Lungs: Clear bilaterally to auscultation without wheezes, rales, or rhonchi. Breathing is unlabored. ?Heart: RRR with S1 S2.  No murmur . No rubs, or gallops appreciated. ?Abdomen: Soft, non-tender, non-distended with normoactive bowel sounds. No hepatomegaly. No rebound/guarding. No obvious abdominal masses. ?Msk:  Strength and tone appear normal for age. ?Extremities: No clubbing or cyanosis. No edema.  Distal pedal pulses are 2+ and equal bilaterally. ?Skin: Warm and Dry ?Neuro: Alert and oriented X 3. CN III-XII intact Grossly normal sensory and motor function . ?Psych:  Responds to questions appropriately with a normal affect. ?  ?  ?  ? ?EKG: Sinus at 75 with 3: 2 and 4: 3 second-degree AV block type I ? ? ?Assessment and Plan:  ?Wenckebach heart block ? ?Coronary artery disease with remote LAD stenting ? ?Dyspnea on exertion ? ?COPD ? ?Obesity ? ?The patient has bradycardia which has been persistent and relatively stable over the last couple of years, thus preceding her recent change in symptoms and making it harder to implicate the Wenckebach block as the cause of them.  Interestingly, her monitor from 11/22 and her ECG 3/23 both demonstrated the ability to increase AV nodal conduction rather appropriate levels i.e. 80s-90s, this also makes chronotropic incompetence as a consequence of the bradycardia less likely the culprit of her symptoms. ? ?I discussed this with Dr. Angelena Form, and I tend to agree towards his leaning that ischemia i.e. progression of her coronary disease and dyspnea as an anginal equivalent is perhaps more likely than the bradycardia as an explanation for the change in her symptoms.  Hence, he will follow-up with her regarding catheterization; he  anticipates that he will see significant progression of her coronary disease and it might be that medical empirical therapy is indicated.  If at that point or thereafter, bradycardia becomes more problematic, w

## 2022-04-03 DIAGNOSIS — I5033 Acute on chronic diastolic (congestive) heart failure: Secondary | ICD-10-CM | POA: Diagnosis not present

## 2022-04-03 DIAGNOSIS — I4819 Other persistent atrial fibrillation: Secondary | ICD-10-CM | POA: Diagnosis not present

## 2022-04-03 DIAGNOSIS — E1151 Type 2 diabetes mellitus with diabetic peripheral angiopathy without gangrene: Secondary | ICD-10-CM | POA: Diagnosis not present

## 2022-04-03 DIAGNOSIS — I2510 Atherosclerotic heart disease of native coronary artery without angina pectoris: Secondary | ICD-10-CM | POA: Diagnosis not present

## 2022-04-03 DIAGNOSIS — I110 Hypertensive heart disease with heart failure: Secondary | ICD-10-CM | POA: Diagnosis not present

## 2022-04-03 DIAGNOSIS — J449 Chronic obstructive pulmonary disease, unspecified: Secondary | ICD-10-CM | POA: Diagnosis not present

## 2022-04-04 DIAGNOSIS — I4819 Other persistent atrial fibrillation: Secondary | ICD-10-CM | POA: Diagnosis not present

## 2022-04-04 DIAGNOSIS — E1142 Type 2 diabetes mellitus with diabetic polyneuropathy: Secondary | ICD-10-CM | POA: Diagnosis not present

## 2022-04-04 DIAGNOSIS — J449 Chronic obstructive pulmonary disease, unspecified: Secondary | ICD-10-CM | POA: Diagnosis not present

## 2022-04-04 DIAGNOSIS — Z7901 Long term (current) use of anticoagulants: Secondary | ICD-10-CM | POA: Diagnosis not present

## 2022-04-04 DIAGNOSIS — I5030 Unspecified diastolic (congestive) heart failure: Secondary | ICD-10-CM | POA: Diagnosis not present

## 2022-04-04 NOTE — Telephone Encounter (Signed)
**Note De-Identified Nichlos Kunzler Obfuscation** The pts son e-mailed the pts proof of income to me on 4/19 with request to fax it to [facility]. ? ?I have e-mailed the pts proof of income to [place] and [place], Sports coach with a request for one of them to fax to [facility] at the fax number written on the cover letter included. ?

## 2022-04-10 DIAGNOSIS — I4819 Other persistent atrial fibrillation: Secondary | ICD-10-CM | POA: Diagnosis not present

## 2022-04-10 DIAGNOSIS — E1151 Type 2 diabetes mellitus with diabetic peripheral angiopathy without gangrene: Secondary | ICD-10-CM | POA: Diagnosis not present

## 2022-04-10 DIAGNOSIS — J449 Chronic obstructive pulmonary disease, unspecified: Secondary | ICD-10-CM | POA: Diagnosis not present

## 2022-04-10 DIAGNOSIS — I110 Hypertensive heart disease with heart failure: Secondary | ICD-10-CM | POA: Diagnosis not present

## 2022-04-10 DIAGNOSIS — I5033 Acute on chronic diastolic (congestive) heart failure: Secondary | ICD-10-CM | POA: Diagnosis not present

## 2022-04-10 DIAGNOSIS — I2510 Atherosclerotic heart disease of native coronary artery without angina pectoris: Secondary | ICD-10-CM | POA: Diagnosis not present

## 2022-04-12 DIAGNOSIS — I110 Hypertensive heart disease with heart failure: Secondary | ICD-10-CM | POA: Diagnosis not present

## 2022-04-12 DIAGNOSIS — I4819 Other persistent atrial fibrillation: Secondary | ICD-10-CM | POA: Diagnosis not present

## 2022-04-12 DIAGNOSIS — I5033 Acute on chronic diastolic (congestive) heart failure: Secondary | ICD-10-CM | POA: Diagnosis not present

## 2022-04-12 DIAGNOSIS — I2510 Atherosclerotic heart disease of native coronary artery without angina pectoris: Secondary | ICD-10-CM | POA: Diagnosis not present

## 2022-04-12 DIAGNOSIS — E1151 Type 2 diabetes mellitus with diabetic peripheral angiopathy without gangrene: Secondary | ICD-10-CM | POA: Diagnosis not present

## 2022-04-12 DIAGNOSIS — J449 Chronic obstructive pulmonary disease, unspecified: Secondary | ICD-10-CM | POA: Diagnosis not present

## 2022-04-15 ENCOUNTER — Ambulatory Visit: Payer: Medicare Other | Attending: Family | Admitting: Family

## 2022-04-15 ENCOUNTER — Encounter: Payer: Self-pay | Admitting: Family

## 2022-04-15 VITALS — BP 130/72 | HR 73 | Resp 18 | Ht 63.0 in | Wt 207.0 lb

## 2022-04-15 DIAGNOSIS — E785 Hyperlipidemia, unspecified: Secondary | ICD-10-CM | POA: Insufficient documentation

## 2022-04-15 DIAGNOSIS — I110 Hypertensive heart disease with heart failure: Secondary | ICD-10-CM | POA: Diagnosis not present

## 2022-04-15 DIAGNOSIS — I5032 Chronic diastolic (congestive) heart failure: Secondary | ICD-10-CM | POA: Insufficient documentation

## 2022-04-15 DIAGNOSIS — H409 Unspecified glaucoma: Secondary | ICD-10-CM | POA: Insufficient documentation

## 2022-04-15 DIAGNOSIS — I4819 Other persistent atrial fibrillation: Secondary | ICD-10-CM | POA: Diagnosis not present

## 2022-04-15 DIAGNOSIS — I10 Essential (primary) hypertension: Secondary | ICD-10-CM | POA: Diagnosis not present

## 2022-04-15 DIAGNOSIS — Z87891 Personal history of nicotine dependence: Secondary | ICD-10-CM | POA: Insufficient documentation

## 2022-04-15 DIAGNOSIS — I2510 Atherosclerotic heart disease of native coronary artery without angina pectoris: Secondary | ICD-10-CM | POA: Insufficient documentation

## 2022-04-15 DIAGNOSIS — J449 Chronic obstructive pulmonary disease, unspecified: Secondary | ICD-10-CM

## 2022-04-15 MED ORDER — ENTRESTO 24-26 MG PO TABS: 1.0000 | ORAL_TABLET | Freq: Two times a day (BID) | ORAL | 3 refills | Status: DC

## 2022-04-15 NOTE — Patient Instructions (Signed)
Continue weighing daily and call for an overnight weight gain of 3 pounds or more or a weekly weight gain of more than 5 pounds.   If you have voicemail, please make sure your mailbox is cleaned out so that we may leave a message and please make sure to listen to any voicemails.     

## 2022-04-15 NOTE — Telephone Encounter (Signed)
**Note De-Identified Kyrin Garn Obfuscation** Per letter received Altamese Deguire fax from [facility], they have denied the pt asst with Eliquis. ?Reason: ?Documentation of 3% out of pocket RX expense, based on adjusted household income, not met. ? ?The letter states that they have notified the pt of this denial as well. ?

## 2022-04-15 NOTE — Progress Notes (Signed)
? Patient ID: Jenny [name], female    DOB: [birth year], 88 y.o.   MRN: [id number] ? ?HPI ? ?Jenny [name] is a 88 y/o female with a history of CAD, hyperlipidemia, HTN, anemia, COPD, glaucoma, atrial fibrillation, recent tobacco use and chronic heart failure.  ? ?Echo report from 08/22/21 reviewed and showed an EF of 60-65% along with mild LVH/ LAE, mild MR and severely elevated PA pressure of 65.1 mmHg.  ? ?Admitted 11/03/21 due to acute on chronic heart failure. Needed oxygen at 3L but then able to be weaned down to room air. Initially given IV lasix with transition to oral diuretics. PT evaluation done. Discharged after 3 days.  ? ?She presents today for a follow-up visit with a chief complaint of minimal shortness of breath with moderate exertion. Describes this as chronic in nature having been present for several years. She has associated fatigue, cough, easy bruising, chronic difficulty sleeping and gradual weight gain along with this. She denies any abdominal distention, palpitations, pedal edema, chest pain, wheezing or dizziness.  ? ?Says that she has reached the donut hole with her medications and her eliquis and entresto are going to be potentially cost prohibitive. Says that she's recently filled out patient assistance for eliquis.  ? ?Discussion being had about whether she needs a catheterization or not.  ? ?Past Medical History:  ?Diagnosis Date  ? ACUT DUOD ULCER W/HEMORR&PERF W/O MENTION OBST 10/05/2009  ? NSAID induced  ? ALLERGIC RHINITIS CAUSE UNSPECIFIED   ? ANEMIA-NOS   ? CAD (coronary artery disease) 06/08/2009  ? DEs RCA with 70% LAD and EF 60%  ? CHF (congestive heart failure) (HCC)   ? COPD   ? mild obst on PFTs 03/2010  ? Diabetes mellitus 06/2010 dx  ? Mild, diet controlled  ? GLAUCOMA   ? HYPERLIPIDEMIA   ? HYPERTENSION, BENIGN   ? MYOCARDIAL INFARCTION 06/08/2009  ? des to rca  ? Persistent atrial fibrillation (HCC)   ? Dx 08/2021  ? TOBACCO ABUSE   ? ?Past Surgical History:  ?Procedure Laterality  Date  ? CARDIOVERSION N/A 01/30/2022  ? Procedure: CARDIOVERSION;  Surgeon: Vesta MixerNahser, Philip J, MD;  Location[facility] ENDOSCOPY;  Service: Cardiovascular;  Laterality: N/A;  ? HEMORRHOID SURGERY  1990  ? Right knee surgery    ? ?Family History  ?Problem Relation Age of Onset  ? Hypertension Mother   ? Ulcers Mother   ? Stomach cancer Father   ? Stroke Maternal Grandmother   ? Heart attack Neg Hx   ? ?Social History  ? ?Tobacco Use  ? Smoking status: Former  ?  Packs/day: 0.50  ?  Years: 60.00  ?  Pack years: 30.00  ?  Types: Cigarettes  ?  Quit date: 11/12/2021  ?  Years since quitting: 0.4  ? Smokeless tobacco: Never  ? Tobacco comments:  ?  [place] in [place] with her significant other Leonette Most([place])0  ?Substance Use Topics  ? Alcohol use: Yes  ?  Alcohol/week: 1.0 - 2.0 standard drink  ?  Types: 1 - 2 Glasses of wine per week  ?  Comment: once or twice a year glass of wine 12/25/21  ? ?Allergies  ?Allergen Reactions  ? Aspirin Other (See Comments)  ?  Can take 81 mg not 325 mg  ? Atorvastatin Itching  ?  Itching and myaliga  ? Cyclobenzaprine   ?  Other reaction(s): muscle relaxers-ulcer hemorrhage (hospitalized)  ? Lactose Intolerance (Gi) Other (See Comments)  ?  GI upset  ? Meperidine Hcl Other (See Comments)  ?  Not known  ? Pravastatin Rash  ?  rash  ? Propoxyphene   ?  Other reaction(s): pain meds, hallucination, vomiting  ? Wound Dressing Adhesive   ?  Other reaction(s): red, stings  ? ?Prior to Admission medications   ?Medication Sig Start Date End Date Taking? Authorizing Provider  ?albuterol (VENTOLIN HFA) 108 (90 Base) MCG/ACT inhaler INHALE ONE PUFF EVERY 6 HOURS AS NEEDED FOR SHORTNESS OF BREATH 11/03/21  Yes Gilles Chiquito, MD  ?apixaban (ELIQUIS) 5 MG TABS tablet Take 1 tablet (5 mg total) by mouth 2 (two) times daily. 10/11/21  Yes Turner, Cornelious Bryant, MD  ?dorzolamide (TRUSOPT) 2 % ophthalmic solution Place 1 drop into both eyes 2 (two) times daily.   Yes [provider]  ?famotidine (PEPCID) 20 MG  tablet Take 20 mg by mouth 2 (two) times daily as needed for heartburn.   Yes [provider]  ?Fluticasone-Umeclidin-Vilant (TRELEGY ELLIPTA) 100-62.5-25 MCG/INH AEPB Inhale 1 puff into the lungs daily. 08/27/21  Yes Almon Hercules, MD  ?gabapentin (NEURONTIN) 100 MG capsule Take 100-200 mg by mouth See admin instructions. Take 1 capsule (100mg ) by mouth every morning and take 2 capsules (200mg ) by mouth at bedtime   Yes [provider]  ?ipratropium-albuterol (DUONEB) 0.5-2.5 (3) MG/3ML SOLN Take 3 mLs by nebulization every 6 (six) hours as needed. 11/06/21  Yes , MD  ?latanoprost (XALATAN) 0.005 % ophthalmic solution Place 1 drop into both eyes in the morning.   Yes [provider]  ?magnesium hydroxide (MILK OF MAGNESIA) 400 MG/5ML suspension Take 30 mLs by mouth as needed for mild constipation.   Yes [provider]  ?NITROSTAT 0.4 MG SL tablet DISSOLVE ONE TABLET UNDER TONGUE AS NEEDED FOR CHEST PAIN - MAY REPEAT TWICE-IF NO RELIEF GO TO NEAREST HOSPITAL ER 05/06/13  Yes Enedina Finner, MD  ?nystatin (MYCOSTATIN/NYSTOP) powder Apply 1 application topically daily. 01/22/22  Yes [provider]  ?rosuvastatin (CRESTOR) 20 MG tablet Take 20 mg by mouth daily.   Yes [provider]  ?torsemide 40 MG TABS Take 40 mg by mouth daily. And additional 20mg  if needed for weight gain, shortness of breath or swelling 12/14/21  Yes 01/24/22, FNP  ?sacubitril-valsartan (ENTRESTO) 24-26 MG Take 1 tablet by mouth 2 (two) times daily. 04/15/22   02/11/22, FNP  ? ? ?Review of Systems  ?Constitutional:  Positive for fatigue. Negative for appetite change.  ?HENT:  Negative for congestion, postnasal drip and sore throat.   ?Eyes: Negative.   ?Respiratory:  Positive for cough and shortness of breath (with moderate exertion). Negative for chest tightness and wheezing.   ?Cardiovascular:  Negative for chest pain, palpitations and leg swelling.   ?Gastrointestinal:  Negative for abdominal distention and abdominal pain.  ?Endocrine: Negative.   ?Genitourinary: Negative.   ?Musculoskeletal:  Negative for back pain and neck pain.  ?Skin: Negative.   ?Allergic/Immunologic: Negative.   ?Neurological:  Negative for dizziness and light-headedness.  ?Hematological:  Negative for adenopathy. Bruises/bleeds easily.  ?Psychiatric/Behavioral:  Positive for sleep disturbance (chronic issue; sleeping sitting on sofa). Negative for dysphoric mood. The patient is not nervous/anxious.   ? ?Vitals:  ? 04/15/22 1005  ?BP: 130/72  ?Pulse: 73  ?Resp: 18  ?SpO2: 100%  ?Weight: 207 lb (93.9 kg)  ?Height: 5\' 3"  (1.6 m)  ? ?Wt Readings from Last 3 Encounters:  ?04/15/22 207 lb (93.9 kg)  ?  03/28/22 210 lb (95.3 kg)  ?03/25/22 208 lb 9.6 oz (94.6 kg)  ? ?Lab Results  ?Component Value Date  ? CREATININE 1.24 (H) 01/30/2022  ? CREATININE 1.36 (H) 01/14/2022  ? CREATININE 1.18 (H) 01/03/2022  ? ?Physical Exam ?Vitals and nursing note reviewed. Exam conducted with a chaperone present (son).  ?Constitutional:   ?   Appearance: Normal appearance.  ?HENT:  ?   Head: Normocephalic.  ?Cardiovascular:  ?   Rate and Rhythm: Normal rate and regular rhythm.  ?Pulmonary:  ?   Effort: Pulmonary effort is normal. No respiratory distress.  ?   Breath sounds: No wheezing or rales.  ?Abdominal:  ?   General: There is no distension.  ?   Palpations: Abdomen is soft.  ?   Tenderness: There is no abdominal tenderness.  ?Musculoskeletal:     ?   General: No tenderness.  ?   Cervical back: Normal range of motion and neck supple.  ?   Right lower leg: No edema.  ?   Left lower leg: No edema.  ?Skin: ?   General: Skin is warm and dry.  ?Neurological:  ?   General: No focal deficit present.  ?   Mental Status: She is alert and oriented to person, place, and time.  ?Psychiatric:     ?   Mood and Affect: Mood normal.     ?   Behavior: Behavior normal.     ?   Thought Content: Thought content normal.   ? ? ?Assessment & Plan: ? ?1: Chronic heart failure with preserved ejection fraction with structural changes (LVH/LAE)- ?- NYHA class II ?- euvolemic today ?- weighing daily; reminded to call for an overnight weight gain of > 2

## 2022-04-16 ENCOUNTER — Other Ambulatory Visit: Payer: Self-pay | Admitting: Family

## 2022-04-16 DIAGNOSIS — J449 Chronic obstructive pulmonary disease, unspecified: Secondary | ICD-10-CM | POA: Diagnosis not present

## 2022-04-16 DIAGNOSIS — I110 Hypertensive heart disease with heart failure: Secondary | ICD-10-CM | POA: Diagnosis not present

## 2022-04-16 DIAGNOSIS — I4819 Other persistent atrial fibrillation: Secondary | ICD-10-CM | POA: Diagnosis not present

## 2022-04-16 DIAGNOSIS — I2510 Atherosclerotic heart disease of native coronary artery without angina pectoris: Secondary | ICD-10-CM | POA: Diagnosis not present

## 2022-04-16 DIAGNOSIS — E1151 Type 2 diabetes mellitus with diabetic peripheral angiopathy without gangrene: Secondary | ICD-10-CM | POA: Diagnosis not present

## 2022-04-16 DIAGNOSIS — I5033 Acute on chronic diastolic (congestive) heart failure: Secondary | ICD-10-CM | POA: Diagnosis not present

## 2022-04-16 MED ORDER — TORSEMIDE 40 MG PO TABS: 20.0000 mg | ORAL_TABLET | Freq: Every day | ORAL | 5 refills | Status: DC

## 2022-04-16 NOTE — Progress Notes (Signed)
Updated torsemide dose to 20mg  daily and extra 20mg  PRN ?

## 2022-04-17 ENCOUNTER — Telehealth: Payer: Self-pay | Admitting: Family

## 2022-04-17 DIAGNOSIS — I5033 Acute on chronic diastolic (congestive) heart failure: Secondary | ICD-10-CM | POA: Diagnosis not present

## 2022-04-17 DIAGNOSIS — I110 Hypertensive heart disease with heart failure: Secondary | ICD-10-CM | POA: Diagnosis not present

## 2022-04-17 DIAGNOSIS — I4819 Other persistent atrial fibrillation: Secondary | ICD-10-CM | POA: Diagnosis not present

## 2022-04-17 DIAGNOSIS — J449 Chronic obstructive pulmonary disease, unspecified: Secondary | ICD-10-CM | POA: Diagnosis not present

## 2022-04-17 DIAGNOSIS — I2510 Atherosclerotic heart disease of native coronary artery without angina pectoris: Secondary | ICD-10-CM | POA: Diagnosis not present

## 2022-04-17 DIAGNOSIS — E1151 Type 2 diabetes mellitus with diabetic peripheral angiopathy without gangrene: Secondary | ICD-10-CM | POA: Diagnosis not present

## 2022-04-17 NOTE — Telephone Encounter (Signed)
Notified patient and son that she was approved for [employer] patient assitance until 12/08/22 and instructions on how to get medication filled. ? ? ?Rayshard Schirtzinger, NT ?

## 2022-04-18 DIAGNOSIS — J449 Chronic obstructive pulmonary disease, unspecified: Secondary | ICD-10-CM | POA: Diagnosis not present

## 2022-04-18 DIAGNOSIS — E1142 Type 2 diabetes mellitus with diabetic polyneuropathy: Secondary | ICD-10-CM | POA: Diagnosis not present

## 2022-04-18 DIAGNOSIS — Z7189 Other specified counseling: Secondary | ICD-10-CM | POA: Diagnosis not present

## 2022-04-18 DIAGNOSIS — I5030 Unspecified diastolic (congestive) heart failure: Secondary | ICD-10-CM | POA: Diagnosis not present

## 2022-04-22 ENCOUNTER — Encounter: Payer: Self-pay | Admitting: Cardiovascular Disease

## 2022-04-22 ENCOUNTER — Ambulatory Visit (INDEPENDENT_AMBULATORY_CARE_PROVIDER_SITE_OTHER): Payer: Medicare Other | Admitting: Cardiovascular Disease

## 2022-04-22 VITALS — BP 136/64 | HR 73 | Ht 63.0 in | Wt 211.4 lb

## 2022-04-22 DIAGNOSIS — I10 Essential (primary) hypertension: Secondary | ICD-10-CM | POA: Diagnosis not present

## 2022-04-22 DIAGNOSIS — E7800 Pure hypercholesterolemia, unspecified: Secondary | ICD-10-CM | POA: Diagnosis not present

## 2022-04-22 DIAGNOSIS — I2510 Atherosclerotic heart disease of native coronary artery without angina pectoris: Secondary | ICD-10-CM | POA: Diagnosis not present

## 2022-04-22 DIAGNOSIS — I4819 Other persistent atrial fibrillation: Secondary | ICD-10-CM | POA: Diagnosis not present

## 2022-04-22 DIAGNOSIS — I5032 Chronic diastolic (congestive) heart failure: Secondary | ICD-10-CM

## 2022-04-22 NOTE — Telephone Encounter (Signed)
Pt requesting samples of Eliquis 5 mg tablets. Michalene, RN requesting samples of Eliquis for the pt, I explained to her that the pt was denied for pt asst because pt has not met deductible. I did give her 1 box of Eliquis for the pt.  Exp: 2/25  Lot# [id number] FYI ?

## 2022-04-22 NOTE — Patient Instructions (Addendum)
Medication Instructions:  ?No changes ?*If you need a refill on your cardiac medications before your next appointment, please call your pharmacy* ? ? ?Lab Work: ?none ?If you have labs (blood work) drawn today and your tests are completely normal, you will receive your results only by: ?MyChart Message (if you have MyChart) OR ?A paper copy in the mail ?If you have any lab test that is abnormal or we need to change your treatment, we will call you to review the results. ? ? ?Testing/Procedures: ?none ? ? ?Follow-Up: ?At [facility], you and your health needs are our priority.  As part of our continuing mission to provide you with exceptional heart care, we have created designated Provider Care Teams.  These Care Teams include your primary Cardiologist (physician) and Advanced Practice Providers (APPs -  Physician Assistants and Nurse Practitioners) who all work together to provide you with the care you need, when you need it. ? ? ?Your next appointment:   ?2-3 month(s) ? ?The format for your next appointment:   ?In Person ? ?Provider:   ?Verne Carrow, MD   ? ? ?Important Information About Sugar ? ? ? ? ?  ?

## 2022-04-22 NOTE — Progress Notes (Signed)
? ?Chief Complaint  ?Patient presents with  ? Follow-up  ?  CAD, dyspnea on exertion  ? ?History of Present Illness: 88 yo female with history of CAD, HTN, HLD, COPD, tobacco abuse, carotid artery disease, Mobitz 1 heart block, chronic diastolic CHF, atrial fibrillation and DM here today cardiac follow up. In July of 2010 she had an inferior MI treated with a drug eluting stent in the right coronary artery. She had residual 70% LAD stenosis and an ejection fraction of 60%. She also has chronic shortness of breath and moderate chronic obstructive pulmonary disease by pulmonary function testing. She has continued to smoke despite advice against this.  Stress myoview 12/20/13 with no ischemia, normal LV function. Mild bilateral carotid artery disease by dopplers in January 2020. She was hospitalized in September 2022 with COPD exacerbation  and acute on chronic diastolic CHF. Echo September 2022 with LVEF=60-65%. She has since been followed in our Advance Heart Failure office at [facility]. She was found to be in atrial fibrillation during that admission. She was diuresed with IV Lasix. She is now on Eliquis. She was found to be bradycardic and toprol was stopped. She had been on ASA and Plavix but both have been stopped. Cardiac monitor October 2022 with 100% atrial fibrillation. She has been seen in the atrial fib clinic. She was admitted to [facility] 11/03/21 with a CHF and COPD exacerbation. She was cardioverted to sinus rhythm in February 2022 and immediately noted to have transient 2:1 AV block. She was seen in our office 03/13/22 by Dr. Lynnette Caffey with dyspnea and fatigue and reported heart rate in the 40s at home. EKG with Mobitz 1 AV block. She was referred to see Dr. Graciela Husbands in the EP clinic. He and I agreed that an ischemic evaluation should be pursued before permanent pacing.  ? ?She is here today for follow up. The patient denies any chest pain, palpitations, lower extremity edema, orthopnea, PND, dizziness, near syncope  or syncope. She has ongoing dyspnea and fatigue. No change over past year. She is not sure she wishes  ?.  ?  ?Primary Care Physician: [facility] ? ?Past Medical History:  ?Diagnosis Date  ? ACUT DUOD ULCER W/HEMORR&PERF W/O MENTION OBST 10/05/2009  ? NSAID induced  ? ALLERGIC RHINITIS CAUSE UNSPECIFIED   ? ANEMIA-NOS   ? CAD (coronary artery disease) 06/08/2009  ? DEs RCA with 70% LAD and EF 60%  ? CHF (congestive heart failure) (HCC)   ? COPD   ? mild obst on PFTs 03/2010  ? Diabetes mellitus 06/2010 dx  ? Mild, diet controlled  ? GLAUCOMA   ? HYPERLIPIDEMIA   ? HYPERTENSION, BENIGN   ? MYOCARDIAL INFARCTION 06/08/2009  ? des to rca  ? Persistent atrial fibrillation (HCC)   ? Dx 08/2021  ? TOBACCO ABUSE   ? ? ?Past Surgical History:  ?Procedure Laterality Date  ? CARDIOVERSION N/A 01/30/2022  ? Procedure: CARDIOVERSION;  Surgeon: Vesta Mixer, MD;  Location: [facility] ENDOSCOPY;  Service: Cardiovascular;  Laterality: N/A;  ? HEMORRHOID SURGERY  1990  ? Right knee surgery    ? ? ?Current Outpatient Medications  ?Medication Sig Dispense Refill  ? albuterol (VENTOLIN HFA) 108 (90 Base) MCG/ACT inhaler INHALE ONE PUFF EVERY 6 HOURS AS NEEDED FOR SHORTNESS OF BREATH 18 g 2  ? apixaban (ELIQUIS) 5 MG TABS tablet Take 1 tablet (5 mg total) by mouth 2 (two) times daily. 60 tablet 11  ? dorzolamide (TRUSOPT) 2 % ophthalmic solution  Place 1 drop into both eyes 2 (two) times daily.    ? famotidine (PEPCID) 20 MG tablet Take 20 mg by mouth 2 (two) times daily as needed for heartburn.    ? Fluticasone-Umeclidin-Vilant (TRELEGY ELLIPTA) 100-62.5-25 MCG/INH AEPB Inhale 1 puff into the lungs daily. 1 each 1  ? gabapentin (NEURONTIN) 100 MG capsule Take 100-200 mg by mouth See admin instructions. Take 1 capsule (100mg ) by mouth every morning and take 2 capsules (200mg ) by mouth at bedtime    ? ipratropium-albuterol (DUONEB) 0.5-2.5 (3) MG/3ML SOLN Take 3 mLs by nebulization every 6 (six) hours as needed. 360 mL 1  ?  latanoprost (XALATAN) 0.005 % ophthalmic solution Place 1 drop into both eyes in the morning.    ? magnesium hydroxide (MILK OF MAGNESIA) 400 MG/5ML suspension Take 30 mLs by mouth as needed for mild constipation.    ? NITROSTAT 0.4 MG SL tablet DISSOLVE ONE TABLET UNDER TONGUE AS NEEDED FOR CHEST PAIN - MAY REPEAT TWICE-IF NO RELIEF GO TO NEAREST HOSPITAL ER 25 tablet 0  ? nystatin (MYCOSTATIN/NYSTOP) powder Apply 1 application topically daily.    ? rosuvastatin (CRESTOR) 20 MG tablet Take 20 mg by mouth daily.    ? sacubitril-valsartan (ENTRESTO) 24-26 MG Take 1 tablet by mouth 2 (two) times daily. 180 tablet 3  ? Torsemide 40 MG TABS Take 20 mg by mouth daily. And additional 20mg  if needed for weight gain, shortness of breath or swelling 40 tablet 5  ? ?No current facility-administered medications for this visit.  ? ? ?Allergies  ?Allergen Reactions  ? Aspirin Other (See Comments)  ?  Can take 81 mg not 325 mg  ? Atorvastatin Itching  ?  Itching and myaliga  ? Cyclobenzaprine   ?  Other reaction(s): muscle relaxers-ulcer hemorrhage (hospitalized)  ? Lactose Intolerance (Gi) Other (See Comments)  ?  GI upset  ? Meperidine Hcl Other (See Comments)  ?  Not known  ? Pravastatin Rash  ?  rash  ? Propoxyphene   ?  Other reaction(s): pain meds, hallucination, vomiting  ? Wound Dressing Adhesive   ?  Other reaction(s): red, stings  ? ? ?Social History  ? ?Socioeconomic History  ? Marital status: Widowed  ?  Spouse name: Not on file  ? Number of children: Not on file  ? Years of education: Not on file  ? Highest education level: Not on file  ?Occupational History  ? Not on file  ?Tobacco Use  ? Smoking status: Former  ?  Packs/day: 0.50  ?  Years: 60.00  ?  Pack years: 30.00  ?  Types: Cigarettes  ?  Quit date: 11/12/2021  ?  Years since quitting: 0.4  ? Smokeless tobacco: Never  ? Tobacco comments:  ?  She lives in [place] with her significant other [place])0  ?Vaping Use  ? Vaping Use: Never used  ?Substance and Sexual  Activity  ? Alcohol use: Yes  ?  Alcohol/week: 1.0 - 2.0 standard drink  ?  Types: 1 - 2 Glasses of wine per week  ?  Comment: once or twice a year glass of wine 12/25/21  ? Drug use: No  ? Sexual activity: Not on file  ?Other Topics Concern  ? Not on file  ?Social History Narrative  ? Lives in [place] with her SO - charles hook  ? Retired -Leonette Most  ? Enjoys travel - live theater/shows  ? ?Social Determinants of Health  ? ?Financial Resource Strain:  Not on file  ?Food Insecurity: Not on file  ?Transportation Needs: Not on file  ?Physical Activity: Not on file  ?Stress: Not on file  ?Social Connections: Not on file  ?Intimate Partner Violence: Not on file  ? ? ?Family History  ?Problem Relation Age of Onset  ? Hypertension Mother   ? Ulcers Mother   ? Stomach cancer Father   ? Stroke Maternal Grandmother   ? Heart attack Neg Hx   ? ? ?Review of Systems:  As stated in the HPI and otherwise negative.  ? ?BP 136/64   Pulse 73   Ht 5\' 3"  (1.6 m)   Wt 211 lb 6.4 oz (95.9 kg)   SpO2 98%   BMI 37.45 kg/m?  ? ?Physical Examination: ? ?General: Well developed, well nourished, NAD  ?HEENT: OP clear, mucus membranes moist  ?SKIN: warm, dry. No rashes. ?Neuro: No focal deficits  ?Musculoskeletal: Muscle strength 5/5 all ext  ?Psychiatric: Mood and affect normal  ?Neck: No JVD, no carotid bruits, no thyromegaly, no lymphadenopathy.  ?Lungs:Clear bilaterally, no wheezes, rhonci, crackles ?Cardiovascular: Regular rate and rhythm. No murmurs, gallops or rubs. ?Abdomen:Soft. Bowel sounds present. Non-tender.  ?Extremities: No lower extremity edema. Pulses are 2 + in the bilateral DP/PT. ? ?EKG:  EKG is not ordered today ?The ekg ordered today demonstrates   ? ?Recent Labs: ?12/07/2021: B Natriuretic Peptide 371.3 ?01/03/2022: ALT 24; Magnesium 2.2 ?01/30/2022: BUN 41; Creatinine, Ser 1.24; Hemoglobin 14.2; Platelets 247; Potassium 4.0; Sodium 135  ?  ?Wt Readings from Last 3 Encounters:  ?04/22/22 211 lb 6.4 oz  (95.9 kg)  ?04/15/22 207 lb (93.9 kg)  ?03/28/22 210 lb (95.3 kg)  ?  ? ?Other studies Reviewed: ?Additional studies/ records that were reviewed today include: . ?Review of the above records demonstrates:  ?

## 2022-04-23 DIAGNOSIS — I5033 Acute on chronic diastolic (congestive) heart failure: Secondary | ICD-10-CM | POA: Diagnosis not present

## 2022-04-23 DIAGNOSIS — I4819 Other persistent atrial fibrillation: Secondary | ICD-10-CM | POA: Diagnosis not present

## 2022-04-23 DIAGNOSIS — I110 Hypertensive heart disease with heart failure: Secondary | ICD-10-CM | POA: Diagnosis not present

## 2022-04-23 DIAGNOSIS — J449 Chronic obstructive pulmonary disease, unspecified: Secondary | ICD-10-CM | POA: Diagnosis not present

## 2022-04-23 DIAGNOSIS — I2510 Atherosclerotic heart disease of native coronary artery without angina pectoris: Secondary | ICD-10-CM | POA: Diagnosis not present

## 2022-04-23 DIAGNOSIS — E1151 Type 2 diabetes mellitus with diabetic peripheral angiopathy without gangrene: Secondary | ICD-10-CM | POA: Diagnosis not present

## 2022-04-24 DIAGNOSIS — I4819 Other persistent atrial fibrillation: Secondary | ICD-10-CM | POA: Diagnosis not present

## 2022-04-24 DIAGNOSIS — I5033 Acute on chronic diastolic (congestive) heart failure: Secondary | ICD-10-CM | POA: Diagnosis not present

## 2022-04-24 DIAGNOSIS — E1151 Type 2 diabetes mellitus with diabetic peripheral angiopathy without gangrene: Secondary | ICD-10-CM | POA: Diagnosis not present

## 2022-04-24 DIAGNOSIS — I110 Hypertensive heart disease with heart failure: Secondary | ICD-10-CM | POA: Diagnosis not present

## 2022-04-24 DIAGNOSIS — J449 Chronic obstructive pulmonary disease, unspecified: Secondary | ICD-10-CM | POA: Diagnosis not present

## 2022-04-24 DIAGNOSIS — I2510 Atherosclerotic heart disease of native coronary artery without angina pectoris: Secondary | ICD-10-CM | POA: Diagnosis not present

## 2022-04-25 DIAGNOSIS — Z7982 Long term (current) use of aspirin: Secondary | ICD-10-CM | POA: Diagnosis not present

## 2022-04-25 DIAGNOSIS — Z7901 Long term (current) use of anticoagulants: Secondary | ICD-10-CM | POA: Diagnosis not present

## 2022-04-25 DIAGNOSIS — Z6837 Body mass index (BMI) 37.0-37.9, adult: Secondary | ICD-10-CM | POA: Diagnosis not present

## 2022-04-25 DIAGNOSIS — E669 Obesity, unspecified: Secondary | ICD-10-CM | POA: Diagnosis not present

## 2022-04-25 DIAGNOSIS — I872 Venous insufficiency (chronic) (peripheral): Secondary | ICD-10-CM | POA: Diagnosis not present

## 2022-04-25 DIAGNOSIS — J449 Chronic obstructive pulmonary disease, unspecified: Secondary | ICD-10-CM | POA: Diagnosis not present

## 2022-04-25 DIAGNOSIS — I5033 Acute on chronic diastolic (congestive) heart failure: Secondary | ICD-10-CM | POA: Diagnosis not present

## 2022-04-25 DIAGNOSIS — F17210 Nicotine dependence, cigarettes, uncomplicated: Secondary | ICD-10-CM | POA: Diagnosis not present

## 2022-04-25 DIAGNOSIS — H409 Unspecified glaucoma: Secondary | ICD-10-CM | POA: Diagnosis not present

## 2022-04-25 DIAGNOSIS — I2510 Atherosclerotic heart disease of native coronary artery without angina pectoris: Secondary | ICD-10-CM | POA: Diagnosis not present

## 2022-04-25 DIAGNOSIS — I110 Hypertensive heart disease with heart failure: Secondary | ICD-10-CM | POA: Diagnosis not present

## 2022-04-25 DIAGNOSIS — I6523 Occlusion and stenosis of bilateral carotid arteries: Secondary | ICD-10-CM | POA: Diagnosis not present

## 2022-04-25 DIAGNOSIS — E785 Hyperlipidemia, unspecified: Secondary | ICD-10-CM | POA: Diagnosis not present

## 2022-04-25 DIAGNOSIS — I4819 Other persistent atrial fibrillation: Secondary | ICD-10-CM | POA: Diagnosis not present

## 2022-05-01 DIAGNOSIS — I2510 Atherosclerotic heart disease of native coronary artery without angina pectoris: Secondary | ICD-10-CM | POA: Diagnosis not present

## 2022-05-01 DIAGNOSIS — L299 Pruritus, unspecified: Secondary | ICD-10-CM | POA: Diagnosis not present

## 2022-05-01 DIAGNOSIS — L03119 Cellulitis of unspecified part of limb: Secondary | ICD-10-CM | POA: Diagnosis not present

## 2022-05-01 DIAGNOSIS — J449 Chronic obstructive pulmonary disease, unspecified: Secondary | ICD-10-CM | POA: Diagnosis not present

## 2022-05-01 DIAGNOSIS — R001 Bradycardia, unspecified: Secondary | ICD-10-CM | POA: Diagnosis not present

## 2022-05-01 DIAGNOSIS — I5033 Acute on chronic diastolic (congestive) heart failure: Secondary | ICD-10-CM | POA: Diagnosis not present

## 2022-05-01 DIAGNOSIS — I110 Hypertensive heart disease with heart failure: Secondary | ICD-10-CM | POA: Diagnosis not present

## 2022-05-01 DIAGNOSIS — R0600 Dyspnea, unspecified: Secondary | ICD-10-CM | POA: Diagnosis not present

## 2022-05-01 DIAGNOSIS — I872 Venous insufficiency (chronic) (peripheral): Secondary | ICD-10-CM | POA: Diagnosis not present

## 2022-05-01 DIAGNOSIS — I4819 Other persistent atrial fibrillation: Secondary | ICD-10-CM | POA: Diagnosis not present

## 2022-05-02 ENCOUNTER — Telehealth: Payer: Self-pay | Admitting: Family

## 2022-05-02 NOTE — Telephone Encounter (Signed)
Received BMP/BNP results from [facility] dated 05/01/22:   BUN 43 Creatinine 1.21 GFR 44 Sodium 140 Potassium 5.3 NT-proBNP 1304 WBC 7.2  Advised Jenny [name] from home health to have patient continue torsemide 60mg  until her weight returns to 200 pounds, watch her sodium/fluid intake and recheck BMP/BNP next week

## 2022-05-03 DIAGNOSIS — I5033 Acute on chronic diastolic (congestive) heart failure: Secondary | ICD-10-CM | POA: Diagnosis not present

## 2022-05-03 DIAGNOSIS — I4819 Other persistent atrial fibrillation: Secondary | ICD-10-CM | POA: Diagnosis not present

## 2022-05-03 DIAGNOSIS — I872 Venous insufficiency (chronic) (peripheral): Secondary | ICD-10-CM | POA: Diagnosis not present

## 2022-05-03 DIAGNOSIS — I110 Hypertensive heart disease with heart failure: Secondary | ICD-10-CM | POA: Diagnosis not present

## 2022-05-03 DIAGNOSIS — J449 Chronic obstructive pulmonary disease, unspecified: Secondary | ICD-10-CM | POA: Diagnosis not present

## 2022-05-03 DIAGNOSIS — I2510 Atherosclerotic heart disease of native coronary artery without angina pectoris: Secondary | ICD-10-CM | POA: Diagnosis not present

## 2022-05-08 ENCOUNTER — Telehealth: Payer: Self-pay | Admitting: Internal Medicine

## 2022-05-08 DIAGNOSIS — I4819 Other persistent atrial fibrillation: Secondary | ICD-10-CM | POA: Diagnosis not present

## 2022-05-08 DIAGNOSIS — I2510 Atherosclerotic heart disease of native coronary artery without angina pectoris: Secondary | ICD-10-CM | POA: Diagnosis not present

## 2022-05-08 DIAGNOSIS — I5033 Acute on chronic diastolic (congestive) heart failure: Secondary | ICD-10-CM | POA: Diagnosis not present

## 2022-05-08 DIAGNOSIS — E875 Hyperkalemia: Secondary | ICD-10-CM | POA: Diagnosis not present

## 2022-05-08 DIAGNOSIS — R0600 Dyspnea, unspecified: Secondary | ICD-10-CM | POA: Diagnosis not present

## 2022-05-08 DIAGNOSIS — I872 Venous insufficiency (chronic) (peripheral): Secondary | ICD-10-CM | POA: Diagnosis not present

## 2022-05-08 DIAGNOSIS — I110 Hypertensive heart disease with heart failure: Secondary | ICD-10-CM | POA: Diagnosis not present

## 2022-05-08 DIAGNOSIS — J449 Chronic obstructive pulmonary disease, unspecified: Secondary | ICD-10-CM | POA: Diagnosis not present

## 2022-05-08 NOTE — Telephone Encounter (Signed)
nurse got a pulse of 44, increase sob  and patient dont want to go to the hospital. Nurse want to inquire bout ZIO patch and she is also drawn labs today that she can have sent over.

## 2022-05-08 NOTE — Telephone Encounter (Signed)
Attempted phone call to Amy, home health nurse.  Left voicemail message to contact [phone].

## 2022-05-08 NOTE — Telephone Encounter (Signed)
Follow Up:     Amy was returning Marsha's call from today(05-08-22)

## 2022-05-09 ENCOUNTER — Telehealth: Payer: Self-pay | Admitting: Family

## 2022-05-09 NOTE — Telephone Encounter (Signed)
Received lab results from [facility] dated 05/08/22:  BUN 44 Creatinine 1.34 Potassium 5.0 GFR 39 NT-proBNP 987

## 2022-05-09 NOTE — Telephone Encounter (Signed)
Spoke with Amy, pt's home health nurse who reports she saw pt in the home yesterday.  Weight down 5 pounds but remains very SOB.  BP 130/60 and HR - 44 but rose to the 70's when walking in the house.  Pt denied CP, pre-syncope or syncope.  Reported continued fatigue and lightheadedness.  Home health nurse states she did draw labs and will send a copy to Dr Angelena Form.  Home health nurse encouraged pt to go to ED due to low HR but pt declined.  A referral to palliative care has been made and they will be enrolling pt next week.  Home health nurse is concerned as pt seems less engaged in activity and generally just not feeling well.  She wanted to make office aware of pt's HR of 44 since pt refused ED.  Pt is scheduled for 3 month f/u with Dr Angelena Form 08/02/2022.

## 2022-05-10 ENCOUNTER — Other Ambulatory Visit: Payer: Self-pay

## 2022-05-10 DIAGNOSIS — I5030 Unspecified diastolic (congestive) heart failure: Secondary | ICD-10-CM | POA: Diagnosis not present

## 2022-05-10 DIAGNOSIS — I5033 Acute on chronic diastolic (congestive) heart failure: Secondary | ICD-10-CM | POA: Diagnosis not present

## 2022-05-10 DIAGNOSIS — I2510 Atherosclerotic heart disease of native coronary artery without angina pectoris: Secondary | ICD-10-CM | POA: Diagnosis not present

## 2022-05-10 DIAGNOSIS — Z7901 Long term (current) use of anticoagulants: Secondary | ICD-10-CM | POA: Diagnosis not present

## 2022-05-10 DIAGNOSIS — I4819 Other persistent atrial fibrillation: Secondary | ICD-10-CM | POA: Diagnosis not present

## 2022-05-10 DIAGNOSIS — E785 Hyperlipidemia, unspecified: Secondary | ICD-10-CM | POA: Diagnosis not present

## 2022-05-10 DIAGNOSIS — I872 Venous insufficiency (chronic) (peripheral): Secondary | ICD-10-CM | POA: Diagnosis not present

## 2022-05-10 DIAGNOSIS — J449 Chronic obstructive pulmonary disease, unspecified: Secondary | ICD-10-CM | POA: Diagnosis not present

## 2022-05-10 DIAGNOSIS — I110 Hypertensive heart disease with heart failure: Secondary | ICD-10-CM | POA: Diagnosis not present

## 2022-05-10 DIAGNOSIS — E1142 Type 2 diabetes mellitus with diabetic polyneuropathy: Secondary | ICD-10-CM | POA: Diagnosis not present

## 2022-05-16 DIAGNOSIS — J449 Chronic obstructive pulmonary disease, unspecified: Secondary | ICD-10-CM | POA: Diagnosis not present

## 2022-05-16 DIAGNOSIS — I5033 Acute on chronic diastolic (congestive) heart failure: Secondary | ICD-10-CM | POA: Diagnosis not present

## 2022-05-16 DIAGNOSIS — I110 Hypertensive heart disease with heart failure: Secondary | ICD-10-CM | POA: Diagnosis not present

## 2022-05-16 DIAGNOSIS — I4819 Other persistent atrial fibrillation: Secondary | ICD-10-CM | POA: Diagnosis not present

## 2022-05-16 DIAGNOSIS — I2510 Atherosclerotic heart disease of native coronary artery without angina pectoris: Secondary | ICD-10-CM | POA: Diagnosis not present

## 2022-05-16 DIAGNOSIS — I872 Venous insufficiency (chronic) (peripheral): Secondary | ICD-10-CM | POA: Diagnosis not present

## 2022-05-17 DIAGNOSIS — I872 Venous insufficiency (chronic) (peripheral): Secondary | ICD-10-CM | POA: Diagnosis not present

## 2022-05-17 DIAGNOSIS — I5033 Acute on chronic diastolic (congestive) heart failure: Secondary | ICD-10-CM | POA: Diagnosis not present

## 2022-05-17 DIAGNOSIS — I110 Hypertensive heart disease with heart failure: Secondary | ICD-10-CM | POA: Diagnosis not present

## 2022-05-17 DIAGNOSIS — I2510 Atherosclerotic heart disease of native coronary artery without angina pectoris: Secondary | ICD-10-CM | POA: Diagnosis not present

## 2022-05-17 DIAGNOSIS — J449 Chronic obstructive pulmonary disease, unspecified: Secondary | ICD-10-CM | POA: Diagnosis not present

## 2022-05-17 DIAGNOSIS — I4819 Other persistent atrial fibrillation: Secondary | ICD-10-CM | POA: Diagnosis not present

## 2022-05-22 DIAGNOSIS — I872 Venous insufficiency (chronic) (peripheral): Secondary | ICD-10-CM | POA: Diagnosis not present

## 2022-05-22 DIAGNOSIS — I2510 Atherosclerotic heart disease of native coronary artery without angina pectoris: Secondary | ICD-10-CM | POA: Diagnosis not present

## 2022-05-22 DIAGNOSIS — I110 Hypertensive heart disease with heart failure: Secondary | ICD-10-CM | POA: Diagnosis not present

## 2022-05-22 DIAGNOSIS — I4819 Other persistent atrial fibrillation: Secondary | ICD-10-CM | POA: Diagnosis not present

## 2022-05-22 DIAGNOSIS — J449 Chronic obstructive pulmonary disease, unspecified: Secondary | ICD-10-CM | POA: Diagnosis not present

## 2022-05-22 DIAGNOSIS — I5033 Acute on chronic diastolic (congestive) heart failure: Secondary | ICD-10-CM | POA: Diagnosis not present

## 2022-05-25 DIAGNOSIS — E785 Hyperlipidemia, unspecified: Secondary | ICD-10-CM | POA: Diagnosis not present

## 2022-05-25 DIAGNOSIS — I110 Hypertensive heart disease with heart failure: Secondary | ICD-10-CM | POA: Diagnosis not present

## 2022-05-25 DIAGNOSIS — E669 Obesity, unspecified: Secondary | ICD-10-CM | POA: Diagnosis not present

## 2022-05-25 DIAGNOSIS — H409 Unspecified glaucoma: Secondary | ICD-10-CM | POA: Diagnosis not present

## 2022-05-25 DIAGNOSIS — F17210 Nicotine dependence, cigarettes, uncomplicated: Secondary | ICD-10-CM | POA: Diagnosis not present

## 2022-05-25 DIAGNOSIS — I6523 Occlusion and stenosis of bilateral carotid arteries: Secondary | ICD-10-CM | POA: Diagnosis not present

## 2022-05-25 DIAGNOSIS — I872 Venous insufficiency (chronic) (peripheral): Secondary | ICD-10-CM | POA: Diagnosis not present

## 2022-05-25 DIAGNOSIS — Z7982 Long term (current) use of aspirin: Secondary | ICD-10-CM | POA: Diagnosis not present

## 2022-05-25 DIAGNOSIS — I5033 Acute on chronic diastolic (congestive) heart failure: Secondary | ICD-10-CM | POA: Diagnosis not present

## 2022-05-25 DIAGNOSIS — Z6837 Body mass index (BMI) 37.0-37.9, adult: Secondary | ICD-10-CM | POA: Diagnosis not present

## 2022-05-25 DIAGNOSIS — I4819 Other persistent atrial fibrillation: Secondary | ICD-10-CM | POA: Diagnosis not present

## 2022-05-25 DIAGNOSIS — I2510 Atherosclerotic heart disease of native coronary artery without angina pectoris: Secondary | ICD-10-CM | POA: Diagnosis not present

## 2022-05-25 DIAGNOSIS — J449 Chronic obstructive pulmonary disease, unspecified: Secondary | ICD-10-CM | POA: Diagnosis not present

## 2022-05-25 DIAGNOSIS — Z7901 Long term (current) use of anticoagulants: Secondary | ICD-10-CM | POA: Diagnosis not present

## 2022-05-27 DIAGNOSIS — I2510 Atherosclerotic heart disease of native coronary artery without angina pectoris: Secondary | ICD-10-CM | POA: Diagnosis not present

## 2022-05-27 DIAGNOSIS — J449 Chronic obstructive pulmonary disease, unspecified: Secondary | ICD-10-CM | POA: Diagnosis not present

## 2022-05-27 DIAGNOSIS — I4819 Other persistent atrial fibrillation: Secondary | ICD-10-CM | POA: Diagnosis not present

## 2022-05-27 DIAGNOSIS — I110 Hypertensive heart disease with heart failure: Secondary | ICD-10-CM | POA: Diagnosis not present

## 2022-05-27 DIAGNOSIS — I872 Venous insufficiency (chronic) (peripheral): Secondary | ICD-10-CM | POA: Diagnosis not present

## 2022-05-27 DIAGNOSIS — I5033 Acute on chronic diastolic (congestive) heart failure: Secondary | ICD-10-CM | POA: Diagnosis not present

## 2022-05-28 ENCOUNTER — Telehealth: Payer: Self-pay

## 2022-05-28 NOTE — Telephone Encounter (Signed)
PATIENT'S ELIQUIS WAS APPROVED ON 05-28-22 TO 12-08-22  APPLICATION # [id number]. PHONE # [phone]

## 2022-06-04 DIAGNOSIS — J449 Chronic obstructive pulmonary disease, unspecified: Secondary | ICD-10-CM | POA: Diagnosis not present

## 2022-06-04 DIAGNOSIS — I2510 Atherosclerotic heart disease of native coronary artery without angina pectoris: Secondary | ICD-10-CM | POA: Diagnosis not present

## 2022-06-04 DIAGNOSIS — I4819 Other persistent atrial fibrillation: Secondary | ICD-10-CM | POA: Diagnosis not present

## 2022-06-04 DIAGNOSIS — I5032 Chronic diastolic (congestive) heart failure: Secondary | ICD-10-CM | POA: Diagnosis not present

## 2022-06-04 DIAGNOSIS — I252 Old myocardial infarction: Secondary | ICD-10-CM | POA: Diagnosis not present

## 2022-06-04 DIAGNOSIS — I495 Sick sinus syndrome: Secondary | ICD-10-CM | POA: Diagnosis not present

## 2022-06-04 DIAGNOSIS — I110 Hypertensive heart disease with heart failure: Secondary | ICD-10-CM | POA: Diagnosis not present

## 2022-06-04 DIAGNOSIS — Z515 Encounter for palliative care: Secondary | ICD-10-CM | POA: Diagnosis not present

## 2022-06-06 DIAGNOSIS — L03115 Cellulitis of right lower limb: Secondary | ICD-10-CM | POA: Diagnosis not present

## 2022-06-06 DIAGNOSIS — K5900 Constipation, unspecified: Secondary | ICD-10-CM | POA: Diagnosis not present

## 2022-06-06 DIAGNOSIS — L299 Pruritus, unspecified: Secondary | ICD-10-CM | POA: Diagnosis not present

## 2022-06-06 DIAGNOSIS — L03116 Cellulitis of left lower limb: Secondary | ICD-10-CM | POA: Diagnosis not present

## 2022-06-06 DIAGNOSIS — R54 Age-related physical debility: Secondary | ICD-10-CM | POA: Diagnosis not present

## 2022-06-18 DIAGNOSIS — I5030 Unspecified diastolic (congestive) heart failure: Secondary | ICD-10-CM | POA: Diagnosis not present

## 2022-06-18 DIAGNOSIS — Z7901 Long term (current) use of anticoagulants: Secondary | ICD-10-CM | POA: Diagnosis not present

## 2022-06-18 DIAGNOSIS — J449 Chronic obstructive pulmonary disease, unspecified: Secondary | ICD-10-CM | POA: Diagnosis not present

## 2022-06-18 DIAGNOSIS — R54 Age-related physical debility: Secondary | ICD-10-CM | POA: Diagnosis not present

## 2022-06-18 DIAGNOSIS — E1142 Type 2 diabetes mellitus with diabetic polyneuropathy: Secondary | ICD-10-CM | POA: Diagnosis not present

## 2022-06-18 DIAGNOSIS — I4819 Other persistent atrial fibrillation: Secondary | ICD-10-CM | POA: Diagnosis not present

## 2022-07-17 DIAGNOSIS — I4819 Other persistent atrial fibrillation: Secondary | ICD-10-CM | POA: Diagnosis not present

## 2022-07-17 DIAGNOSIS — J449 Chronic obstructive pulmonary disease, unspecified: Secondary | ICD-10-CM | POA: Diagnosis not present

## 2022-07-17 DIAGNOSIS — L299 Pruritus, unspecified: Secondary | ICD-10-CM | POA: Diagnosis not present

## 2022-07-17 DIAGNOSIS — L03116 Cellulitis of left lower limb: Secondary | ICD-10-CM | POA: Diagnosis not present

## 2022-07-17 DIAGNOSIS — R54 Age-related physical debility: Secondary | ICD-10-CM | POA: Diagnosis not present

## 2022-07-17 DIAGNOSIS — I5030 Unspecified diastolic (congestive) heart failure: Secondary | ICD-10-CM | POA: Diagnosis not present

## 2022-07-17 DIAGNOSIS — E1142 Type 2 diabetes mellitus with diabetic polyneuropathy: Secondary | ICD-10-CM | POA: Diagnosis not present

## 2022-07-17 DIAGNOSIS — L03115 Cellulitis of right lower limb: Secondary | ICD-10-CM | POA: Diagnosis not present

## 2022-07-22 ENCOUNTER — Ambulatory Visit: Payer: Medicare Other | Attending: Family | Admitting: Family

## 2022-07-22 ENCOUNTER — Encounter: Payer: Self-pay | Admitting: Family

## 2022-07-22 VITALS — BP 126/48 | HR 47 | Resp 14 | Ht 62.0 in | Wt 212.5 lb

## 2022-07-22 DIAGNOSIS — I10 Essential (primary) hypertension: Secondary | ICD-10-CM

## 2022-07-22 DIAGNOSIS — J449 Chronic obstructive pulmonary disease, unspecified: Secondary | ICD-10-CM | POA: Diagnosis not present

## 2022-07-22 DIAGNOSIS — I5032 Chronic diastolic (congestive) heart failure: Secondary | ICD-10-CM | POA: Insufficient documentation

## 2022-07-22 DIAGNOSIS — R0602 Shortness of breath: Secondary | ICD-10-CM | POA: Insufficient documentation

## 2022-07-22 DIAGNOSIS — H409 Unspecified glaucoma: Secondary | ICD-10-CM | POA: Insufficient documentation

## 2022-07-22 DIAGNOSIS — I4819 Other persistent atrial fibrillation: Secondary | ICD-10-CM | POA: Insufficient documentation

## 2022-07-22 DIAGNOSIS — Z7901 Long term (current) use of anticoagulants: Secondary | ICD-10-CM | POA: Diagnosis not present

## 2022-07-22 DIAGNOSIS — E785 Hyperlipidemia, unspecified: Secondary | ICD-10-CM | POA: Diagnosis not present

## 2022-07-22 DIAGNOSIS — I2510 Atherosclerotic heart disease of native coronary artery without angina pectoris: Secondary | ICD-10-CM

## 2022-07-22 DIAGNOSIS — Z87891 Personal history of nicotine dependence: Secondary | ICD-10-CM | POA: Diagnosis not present

## 2022-07-22 DIAGNOSIS — I110 Hypertensive heart disease with heart failure: Secondary | ICD-10-CM | POA: Diagnosis not present

## 2022-07-22 NOTE — Patient Instructions (Addendum)
Continue eating low sodium foods.  Continue monitoring weight daily and keeping log.   Continue weighing daily and call for an overnight weight gain of 3 pounds or more or a weekly weight gain of more than 5 pounds.   Stop Entresto but continue Eliquis.

## 2022-07-22 NOTE — Progress Notes (Signed)
Patient ID: Jenny [name], female    DOB: Nov 18, 1933, 88 y.o.   MRN: [id number]  HPI  Ms Atilano is a 88 y/o female with a history of CAD, hyperlipidemia, HTN, anemia, COPD, glaucoma, atrial fibrillation, recent tobacco use and chronic heart failure.   Echo report from 08/22/21 reviewed and showed an EF of 60-65% along with mild LVH/ LAE, mild MR and severely elevated PA pressure of 65.1 mmHg.   Last ED admission was 01/03/22  Cardioversion 01/30/22  She presents today for a follow-up visit with a chief complaint of chronic minimal shortness of breath with moderate exertion and feels it is worsening may be from entresto and eliquis.She has associated fatigue, cough, easy bruising, chronic difficulty sleeping (sleeps mostly sitting up) and gradual weight gain along with this. She denies any abdominal distention, palpitations, pedal edema, chest pain/pressure, or dizziness. Says that she wears compression hose every other day and monitors her weight daily. Tries to cook low sodium foods and follow-low sodium eating best that she can.   Past Medical History:  Diagnosis Date   ACUT DUOD ULCER W/HEMORR&PERF W/O MENTION OBST 10/05/2009   NSAID induced   ALLERGIC RHINITIS CAUSE UNSPECIFIED    ANEMIA-NOS    CAD (coronary artery disease) 06/08/2009   DEs RCA with 70% LAD and EF 60%   CHF (congestive heart failure) (HCC)    COPD    mild obst on PFTs 03/2010   Diabetes mellitus 06/2010 dx   Mild, diet controlled   GLAUCOMA    HYPERLIPIDEMIA    HYPERTENSION, BENIGN    MYOCARDIAL INFARCTION 06/08/2009   des to rca   Persistent atrial fibrillation (HCC)    Dx 08/2021   TOBACCO ABUSE    Past Surgical History:  Procedure Laterality Date   CARDIOVERSION N/A 01/30/2022   Procedure: CARDIOVERSION;  Surgeon: Elease Hashimoto Deloris Ping, MD;  Location: [facility] ENDOSCOPY;  Service: Cardiovascular;  Laterality: N/A;   HEMORRHOID SURGERY  1990   Right knee surgery     Family History  Problem Relation Age of Onset    Hypertension Mother    Ulcers Mother    Stomach cancer Father    Stroke Maternal Grandmother    Heart attack Neg Hx    Social History   Tobacco Use   Smoking status: Former    Packs/day: 0.50    Years: 60.00    Total pack years: 30.00    Types: Cigarettes    Quit date: 11/12/2021    Years since quitting: 0.6   Smokeless tobacco: Never   Tobacco comments:    She lives in [place] with her significant other (Charles Hook)0  Substance Use Topics   Alcohol use: Yes    Alcohol/week: 1.0 - 2.0 standard drink of alcohol    Types: 1 - 2 Glasses of wine per week    Comment: once or twice a year glass of wine 12/25/21   Allergies  Allergen Reactions   Aspirin Other (See Comments)    Can take 81 mg not 325 mg   Atorvastatin Itching    Itching and myaliga   Cyclobenzaprine     Other reaction(s): muscle relaxers-ulcer hemorrhage (hospitalized)   Lactose Intolerance (Gi) Other (See Comments)    GI upset   Meperidine Hcl Other (See Comments)    Not known   Pravastatin Rash    rash   Propoxyphene     Other reaction(s): pain meds, hallucination, vomiting   Wound Dressing Adhesive     Other reaction(s):  red, stings   Prior to Admission medications   Medication Sig Start Date End Date Taking? Authorizing Provider  acetaminophen (TYLENOL) 500 MG tablet Take 500 mg by mouth every 6 (six) hours as needed. Takes two tablets in the morning and two tablets at night   Yes [provider]  albuterol (VENTOLIN HFA) 108 (90 Base) MCG/ACT inhaler INHALE ONE PUFF EVERY 6 HOURS AS NEEDED FOR SHORTNESS OF BREATH 11/03/21  Yes Gilles Chiquito, MD  apixaban (ELIQUIS) 5 MG TABS tablet Take 1 tablet (5 mg total) by mouth 2 (two) times daily. 10/11/21  Yes Turner, Cornelious Bryant, MD  dorzolamide (TRUSOPT) 2 % ophthalmic solution Place 1 drop into both eyes 2 (two) times daily.   Yes [provider]  famotidine (PEPCID) 20 MG tablet Take 20 mg by mouth 2 (two) times daily as needed for heartburn.    Yes [provider]  Fluticasone-Umeclidin-Vilant (TRELEGY ELLIPTA) 100-62.5-25 MCG/INH AEPB Inhale 1 puff into the lungs daily. 08/27/21  Yes Almon Hercules, MD  gabapentin (NEURONTIN) 100 MG capsule Take 100-200 mg by mouth See admin instructions. Take 1 capsule (100mg ) by mouth every morning and take 2 capsules (200mg ) by mouth at bedtime   Yes [provider]  ipratropium-albuterol (DUONEB) 0.5-2.5 (3) MG/3ML SOLN Take 3 mLs by nebulization every 6 (six) hours as needed. 11/06/21  Yes , MD  latanoprost (XALATAN) 0.005 % ophthalmic solution Place 1 drop into both eyes in the morning.   Yes [provider]  magnesium hydroxide (MILK OF MAGNESIA) 400 MG/5ML suspension Take 30 mLs by mouth as needed for mild constipation.   Yes [provider]  NITROSTAT 0.4 MG SL tablet DISSOLVE ONE TABLET UNDER TONGUE AS NEEDED FOR CHEST PAIN - MAY REPEAT TWICE-IF NO RELIEF GO TO NEAREST HOSPITAL ER 05/06/13  Yes Enedina Finner, MD  nystatin (MYCOSTATIN/NYSTOP) powder Apply 1 application topically daily. 01/22/22  Yes [provider]  rosuvastatin (CRESTOR) 20 MG tablet Take 20 mg by mouth daily.   Yes [provider]  sacubitril-valsartan (ENTRESTO) 24-26 MG Take 1 tablet by mouth 2 (two) times daily. 04/15/22  Yes Tammatha Cobb A, FNP  Torsemide 40 MG TABS Take 20 mg by mouth daily. And additional 20mg  if needed for weight gain, shortness of breath or swelling 04/16/22  Yes 06/15/22, FNP     Review of Systems  Constitutional:  Positive for fatigue. Negative for appetite change.  HENT:  Negative for congestion, postnasal drip and sore throat.   Eyes: Negative.   Respiratory:  Positive for cough and shortness of breath (with moderate exertion). Negative for chest tightness and wheezing.   Cardiovascular:  Negative for chest pain, palpitations and leg swelling.  Gastrointestinal:  Negative for abdominal distention and abdominal pain.   Endocrine: Negative.   Genitourinary: Negative.   Musculoskeletal:  Negative for back pain and neck pain.  Skin: Negative.   Allergic/Immunologic: Negative.   Neurological:  Negative for dizziness and light-headedness.  Hematological:  Negative for adenopathy. Bruises/bleeds easily.  Psychiatric/Behavioral:  Positive for sleep disturbance (chronic issue; sleeping sitting on sofa). Negative for dysphoric mood. The patient is not nervous/anxious.     Vitals:   07/22/22 1005  BP: (!) 126/48  Pulse: (!) 47  Resp: 14  SpO2: 100%  Weight: 212 lb 8 oz (96.4 kg)  Height: 5\' 2"  (1.575 m)   Wt Readings from Last 3 Encounters:  07/22/22 212 lb 8 oz (96.4 kg)  04/22/22 211 lb  6.4 oz (95.9 kg)  04/15/22 207 lb (93.9 kg)    .Marland Kitchen Lab Results  Component Value Date   CREATININE 1.24 (H) 01/30/2022   CREATININE 1.36 (H) 01/14/2022   CREATININE 1.18 (H) 01/03/2022    Physical Exam Vitals and nursing note reviewed. Exam conducted with a chaperone present (son).  Constitutional:      Appearance: Normal appearance.  HENT:     Head: Normocephalic.  Cardiovascular:     Rate and Rhythm: Bradycardia present. Rhythm irregular.  Pulmonary:     Effort: Pulmonary effort is normal. No respiratory distress.     Breath sounds: No wheezing or rales.  Abdominal:     General: There is no distension.     Palpations: Abdomen is soft.     Tenderness: There is no abdominal tenderness.  Musculoskeletal:        General: No tenderness.     Cervical back: Normal range of motion and neck supple.     Right lower leg: No edema.     Left lower leg: No edema.  Skin:    General: Skin is warm and dry.  Neurological:     General: No focal deficit present.     Mental Status: She is alert and oriented to person, place, and time.  Psychiatric:        Mood and Affect: Mood normal.        Behavior: Behavior normal.        Thought Content: Thought content normal.    Assessment & Plan:  1: Chronic heart failure  with preserved ejection fraction with structural changes (LVH/LAE)- - NYHA class II - euvolemic today - weighing daily; reminded to call for an overnight weight gain of > 2 pounds or a weekly weight gain of > 5 pounds - weight up 5 pounds from last visit here 3 months ago; says that it's been gradual and she regained her weight after having covid - not adding salt to her food and tries to eat low sodium foods  - saw cardiology Sanjuana Kava) 04/22/22 - BNP 12/07/21 was 371.3 - patient says that her SOB has been worse since taking entresto & eliquis; she says that she would like to stop both of these medications, resume her plavix and see how she feels - reviewed the potential benefits of entresto and that it shouldn't make her SOB worse but we can stop it for now and see if SOB improves (see below for eliquis conversation)  2: HTN- - BP looks good (126/48) - seeing [facility] for primary care every 2 weeks - BMP 02/21/22 reviewed and showed sodium 137, potassium 4.5, creatinine 1.31 and GFR 40 - saw nephrology Suezanne Jacquet) 02/21/22  3: Persistent atrial fibrillation- - saw EP Graciela Husbands) 03/28/22 - had cardioversion 01/31/22 & post cardioversion, developed Mobitz 2nd degree AV block - emphasized NOT stopping her eliquis due to risk of a clot and increased risk of stroke; explained that eliquis and plavix are not the same and eliquis is best indicated for her; she will discuss further with cardiologist but says that she will stay on eliquis for now  4: COPD- - stopped smoking fall 2022 - continued cessation discussed for 3 minutes  - using trelegy daily and nebulizer PRN   Medication bottles reviewed.   Return in 3 months, sooner if needed.     Marland Kitchen

## 2022-08-02 ENCOUNTER — Ambulatory Visit (INDEPENDENT_AMBULATORY_CARE_PROVIDER_SITE_OTHER): Payer: Medicare Other | Admitting: Cardiovascular Disease

## 2022-08-02 ENCOUNTER — Encounter: Payer: Self-pay | Admitting: Cardiovascular Disease

## 2022-08-02 VITALS — BP 130/60 | HR 43 | Ht 62.0 in | Wt 209.6 lb

## 2022-08-02 DIAGNOSIS — I495 Sick sinus syndrome: Secondary | ICD-10-CM | POA: Diagnosis not present

## 2022-08-02 DIAGNOSIS — E7800 Pure hypercholesterolemia, unspecified: Secondary | ICD-10-CM

## 2022-08-02 DIAGNOSIS — I10 Essential (primary) hypertension: Secondary | ICD-10-CM

## 2022-08-02 DIAGNOSIS — I2510 Atherosclerotic heart disease of native coronary artery without angina pectoris: Secondary | ICD-10-CM

## 2022-08-02 DIAGNOSIS — I480 Paroxysmal atrial fibrillation: Secondary | ICD-10-CM

## 2022-08-02 DIAGNOSIS — I5032 Chronic diastolic (congestive) heart failure: Secondary | ICD-10-CM | POA: Diagnosis not present

## 2022-08-02 NOTE — Progress Notes (Signed)
Chief Complaint  Patient presents with   Follow-up    CAD   History of Present Illness: 88 yo female with history of CAD, HTN, HLD, COPD, tobacco abuse, carotid artery disease, Mobitz 1 heart block, chronic diastolic CHF, atrial fibrillation and DM here today cardiac follow up. In July of 2010 she had an inferior MI treated with a drug eluting stent in the right coronary artery. She had residual moderate LAD stenosis and an ejection fraction of 60%. She has chronic shortness of breath and chronic obstructive pulmonary disease by pulmonary function testing. She has continued to smoke despite advice against this.  Stress myoview 12/20/13 with no ischemia, normal LV function. Mild bilateral carotid artery disease by dopplers in January 2020. She was hospitalized in September 2022 with COPD exacerbation  and acute on chronic diastolic CHF. Echo September 2022 with LVEF=60-65%.  She was found to be in atrial fibrillation during that admission. She was diuresed with IV Lasix. She is now on Eliquis. She was found to be bradycardic and toprol was stopped. She has since been followed in our Advanced Heart Failure office at [facility]. She had been on ASA and Plavix but both have been stopped. Cardiac monitor October 2022 with 100% atrial fibrillation. She has been seen in the atrial fib clinic. She was admitted to [facility] 11/03/21 with a CHF and COPD exacerbation. She was cardioverted to sinus rhythm in February 2023 and immediately noted to have transient 2:1 AV block. She was seen in our office 03/13/22 by Dr. Lynnette Caffey with dyspnea and fatigue and reported heart rate in the 40s at home. EKG with Mobitz 1 AV block. She was referred to see Dr. Graciela Husbands in the EP clinic. He and I agreed that an ischemic evaluation should be pursued before permanent pacing. I saw her in May 2023 and she refused to consider a cardiac cath at that time. She reported ongoing dyspnea and fatigue but no dizziness. She was seen in the CHF clinic at [facility]  07/22/22 and she reported more dyspnea since being on Entresto. Sherryll Burger was stopped.   She is here today for follow up. She feels much better since stopping Entresto. Her energy level is improved. Minimal dyspnea at her baseline. No chest pain. No dizziness or near syncope. She does not wish to consider a cardiac cath.     Primary Care Physician: [facility]  Past Medical History:  Diagnosis Date   ACUT DUOD ULCER W/HEMORR&PERF W/O MENTION OBST 10/05/2009   NSAID induced   ALLERGIC RHINITIS CAUSE UNSPECIFIED    ANEMIA-NOS    CAD (coronary artery disease) 06/08/2009   DEs RCA with 70% LAD and EF 60%   CHF (congestive heart failure) (HCC)    COPD    mild obst on PFTs 03/2010   Diabetes mellitus 06/2010 dx   Mild, diet controlled   GLAUCOMA    HYPERLIPIDEMIA    HYPERTENSION, BENIGN    MYOCARDIAL INFARCTION 06/08/2009   des to rca   Persistent atrial fibrillation (HCC)    Dx 08/2021   TOBACCO ABUSE     Past Surgical History:  Procedure Laterality Date   CARDIOVERSION N/A 01/30/2022   Procedure: CARDIOVERSION;  Surgeon: Elease Hashimoto Deloris Ping, MD;  Location: [facility] ENDOSCOPY;  Service: Cardiovascular;  Laterality: N/A;   HEMORRHOID SURGERY  1990   Right knee surgery      Current Outpatient Medications  Medication Sig Dispense Refill   acetaminophen (TYLENOL) 500 MG tablet Take 500 mg by mouth every  6 (six) hours as needed. Takes two tablets in the morning and two tablets at night     albuterol (VENTOLIN HFA) 108 (90 Base) MCG/ACT inhaler INHALE ONE PUFF EVERY 6 HOURS AS NEEDED FOR SHORTNESS OF BREATH 18 g 2   apixaban (ELIQUIS) 5 MG TABS tablet Take 1 tablet (5 mg total) by mouth 2 (two) times daily. 60 tablet 11   dorzolamide (TRUSOPT) 2 % ophthalmic solution Place 1 drop into both eyes 2 (two) times daily.     famotidine (PEPCID) 20 MG tablet Take 20 mg by mouth 2 (two) times daily as needed for heartburn.     Fluticasone-Umeclidin-Vilant (TRELEGY ELLIPTA) 100-62.5-25  MCG/INH AEPB Inhale 1 puff into the lungs daily. 1 each 1   gabapentin (NEURONTIN) 100 MG capsule Take 100-200 mg by mouth See admin instructions. Take 1 capsule (100mg ) by mouth every morning and take 2 capsules (200mg ) by mouth at bedtime     ipratropium-albuterol (DUONEB) 0.5-2.5 (3) MG/3ML SOLN Take 3 mLs by nebulization every 6 (six) hours as needed. 360 mL 1   latanoprost (XALATAN) 0.005 % ophthalmic solution Place 1 drop into both eyes in the morning.     magnesium hydroxide (MILK OF MAGNESIA) 400 MG/5ML suspension Take 30 mLs by mouth as needed for mild constipation.     NITROSTAT 0.4 MG SL tablet DISSOLVE ONE TABLET UNDER TONGUE AS NEEDED FOR CHEST PAIN - MAY REPEAT TWICE-IF NO RELIEF GO TO NEAREST HOSPITAL ER 25 tablet 0   nystatin (MYCOSTATIN/NYSTOP) powder Apply 1 application topically daily.     rosuvastatin (CRESTOR) 20 MG tablet Take 20 mg by mouth daily.     Torsemide 40 MG TABS Take 20 mg by mouth daily. And additional 20mg  if needed for weight gain, shortness of breath or swelling 40 tablet 5   sacubitril-valsartan (ENTRESTO) 24-26 MG Take 1 tablet by mouth 2 (two) times daily. (Patient not taking: Reported on 08/02/2022) 180 tablet 3   No current facility-administered medications for this visit.    Allergies  Allergen Reactions   Aspirin Other (See Comments)    Can take 81 mg not 325 mg   Atorvastatin Itching    Itching and myaliga   Cyclobenzaprine     Other reaction(s): muscle relaxers-ulcer hemorrhage (hospitalized)   Lactose Intolerance (Gi) Other (See Comments)    GI upset   Meperidine Hcl Other (See Comments)    Not known   Pravastatin Rash    rash   Propoxyphene     Other reaction(s): pain meds, hallucination, vomiting   Wound Dressing Adhesive     Other reaction(s): red, stings    Social History   Socioeconomic History   Marital status: Widowed    Spouse name: Not on file   Number of children: Not on file   Years of education: Not on file   Highest  education level: Not on file  Occupational History   Not on file  Tobacco Use   Smoking status: Former    Packs/day: 0.50    Years: 60.00    Total pack years: 30.00    Types: Cigarettes    Quit date: 11/12/2021    Years since quitting: 0.7   Smokeless tobacco: Never   Tobacco comments:    She lives in [place] with her significant other (Charles Hook)0  Vaping Use   Vaping Use: Never used  Substance and Sexual Activity   Alcohol use: Yes    Alcohol/week: 1.0 - 2.0 standard drink of alcohol  Types: 1 - 2 Glasses of wine per week    Comment: once or twice a year glass of wine 12/25/21   Drug use: No   Sexual activity: Not on file  Other Topics Concern   Not on file  Social History Narrative   Lives in [place] with her SO - charles hook   Retired -Acupuncturist   Enjoys travel - live Engineer, maintenance (IT)   Social Determinants of Corporate investment banker Strain: Not on Ship broker Insecurity: Not on file  Transportation Needs: Not on file  Physical Activity: Not on file  Stress: Not on file  Social Connections: Not on file  Intimate Partner Violence: Not on file    Family History  Problem Relation Age of Onset   Hypertension Mother    Ulcers Mother    Stomach cancer Father    Stroke Maternal Grandmother    Heart attack Neg Hx     Review of Systems:  As stated in the HPI and otherwise negative.   BP 130/60   Pulse (!) 43   Ht 5\' 2"  (1.575 m)   Wt 209 lb 9.6 oz (95.1 kg)   SpO2 96%   BMI 38.34 kg/m   Physical Examination:  General: Well developed, well nourished, NAD  HEENT: OP clear, mucus membranes moist  SKIN: warm, dry. No rashes. Neuro: No focal deficits  Musculoskeletal: Muscle strength 5/5 all ext  Psychiatric: Mood and affect normal  Neck: No JVD, no carotid bruits, no thyromegaly, no lymphadenopathy.  Lungs:Clear bilaterally, no wheezes, rhonci, crackles Cardiovascular: Regular rate and rhythm. No murmurs, gallops or rubs. Abdomen:Soft.  Bowel sounds present. Non-tender.  Extremities: No lower extremity edema. Pulses are 2 + in the bilateral DP/PT.  EKG:  EKG is ordered today The ekg ordered today demonstrates  Sinus brady with 2:1 Mobitz 1 AV block Inferior Q waves. Poor R wave progression  Recent Labs: 12/07/2021: B Natriuretic Peptide 371.3 01/03/2022: ALT 24; Magnesium 2.2 01/30/2022: BUN 41; Creatinine, Ser 1.24; Hemoglobin 14.2; Platelets 247; Potassium 4.0; Sodium 135    Wt Readings from Last 3 Encounters:  08/02/22 209 lb 9.6 oz (95.1 kg)  07/22/22 212 lb 8 oz (96.4 kg)  04/22/22 211 lb 6.4 oz (95.9 kg)     Other studies Reviewed: Additional studies/ records that were reviewed today include: . Review of the above records demonstrates:   Assessment and Plan:   1. CAD without angina: She has been known to have a moderate LAD stenosis since her cath in 2010 and has continued smoking since then. No chest pain. No change in baseline dyspnea. We have discussed a cardiac cath again but she does not wish to proceed.  Continue statin. Her anti-platelet therapy was stopped when she was started on Eliquis. She has not been on a beta blocker due to bradycardia.      2. HYPERTENSION: BP is well controlled at home.  No changes today  3. HYPERLIPIDEMIA: Lipids followed in primary care. LDL 43 in November 2022. Continue statin.   4. Carotid artery disease: Mild bilateral carotid disease by dopplers January 2020.    5. Tobacco abuse: Smoking cessation is recommended.   6. Atrial fibrillation/History of Second degree AV block, type 1/First degree AV block: She has been seen by Dr. 08-01-1970 and pacemaker placement on hold pending ischemic evaluation. She reports heart rates in the 50s at home. No dizziness. She will continue on Eliquis.  She does not wish to consider a cath so  will not move forward with a pacemaker.   7. Chronic diastolic CHF: No volume overload on exam. Weight is stable at home. Continue current diuretic  regimen. Continue torsemide.   Current medicines are reviewed at length with the patient today.  The patient does not have concerns regarding medicines.  The following changes have been made:  no change  Labs/ tests ordered today include:   Orders Placed This Encounter  Procedures   EKG 12-Lead    Disposition:   F/U with me in 12  months  Signed, Verne Carrow, MD 08/02/2022 3:28 PM    [facility] [address] [place], [place], [place]  [address] Phone: [phone]; Fax: [phone]

## 2022-08-02 NOTE — Patient Instructions (Signed)
Medication Instructions:  No changes *If you need a refill on your cardiac medications before your next appointment, please call your pharmacy*   Lab Work: none If you have labs (blood work) drawn today and your tests are completely normal, you will receive your results only by: MyChart Message (if you have MyChart) OR A paper copy in the mail If you have any lab test that is abnormal or we need to change your treatment, we will call you to review the results.   Testing/Procedures: none   Follow-Up: At [facility], you and your health needs are our priority.  As part of our continuing mission to provide you with exceptional heart care, we have created designated Provider Care Teams.  These Care Teams include your primary Cardiologist (physician) and Advanced Practice Providers (APPs -  Physician Assistants and Nurse Practitioners) who all work together to provide you with the care you need, when you need it.   Your next appointment:   6 month(s)  The format for your next appointment:   In Person  Provider:   Christopher McAlhany, MD     Important Information About Sugar       

## 2022-08-15 NOTE — Progress Notes (Signed)
   [facility] is the home-based palliative care program of [facility].  Significant diagnoses / past medical history: COPD, Chronic diastolic heart failure, persistent atrial fibrillation/history of 2nd degree AV block, type 1/First degree AV block, tachy-brady syndrome, s/p cardioversion 2/23, essential hypertension, hyperlipidemia, obesity, CAD without angina, DM2 (diet controlled with polyneuropathy), history of tobacco abuse, history of MI, Frailty score of 5 (mildly frail), History of COVID-19, 1/23 Admitted to [facility]: May 15, 2022 Current and previous weight: 209 lb on admission Current and previous Mid Arm Circumference: 29.8 cm (29 cm on admission) Current and previous Palliative Performance Scale: 70% (70% on admission)   Hospitalization/ED visit in the last 3 months: none Current symptoms/issues/interventions: Patient lives alone in a 1-story home and is able to do light housework/cooking. She has ongoing pitting edema and redness of lower extremities. Interventions include goals of care discussions, teaching/assessment of medical conditions, discussion of available resources, medication teaching, coordination of care with medical providers, and supportive counseling. We monitor the patient's BP using a device in the home. She has frequent bradycardia noted on BP readings but, per cardiology, has declined catheterization so they are not going to do a pacemaker.    Signs of decline over last 3 months: ongoing challenges with stamina Goals of Care: increase functional status, maintain independence Social Work Interventions: Remains available for PRN phone support or visits. Advance Directives: DNR, HCPOA, Living Will

## 2022-08-16 DIAGNOSIS — Z7189 Other specified counseling: Secondary | ICD-10-CM | POA: Diagnosis not present

## 2022-08-16 DIAGNOSIS — B372 Candidiasis of skin and nail: Secondary | ICD-10-CM | POA: Diagnosis not present

## 2022-08-16 DIAGNOSIS — L299 Pruritus, unspecified: Secondary | ICD-10-CM | POA: Diagnosis not present

## 2022-08-16 DIAGNOSIS — R001 Bradycardia, unspecified: Secondary | ICD-10-CM | POA: Diagnosis not present

## 2022-08-16 DIAGNOSIS — R54 Age-related physical debility: Secondary | ICD-10-CM | POA: Diagnosis not present

## 2022-08-16 DIAGNOSIS — S91009A Unspecified open wound, unspecified ankle, initial encounter: Secondary | ICD-10-CM | POA: Diagnosis not present

## 2022-08-16 DIAGNOSIS — I5030 Unspecified diastolic (congestive) heart failure: Secondary | ICD-10-CM | POA: Diagnosis not present

## 2022-08-23 DIAGNOSIS — I4819 Other persistent atrial fibrillation: Secondary | ICD-10-CM | POA: Diagnosis not present

## 2022-08-23 DIAGNOSIS — E785 Hyperlipidemia, unspecified: Secondary | ICD-10-CM | POA: Diagnosis not present

## 2022-08-23 DIAGNOSIS — R001 Bradycardia, unspecified: Secondary | ICD-10-CM | POA: Diagnosis not present

## 2022-08-23 DIAGNOSIS — Z9181 History of falling: Secondary | ICD-10-CM | POA: Diagnosis not present

## 2022-08-23 DIAGNOSIS — E1151 Type 2 diabetes mellitus with diabetic peripheral angiopathy without gangrene: Secondary | ICD-10-CM | POA: Diagnosis not present

## 2022-08-23 DIAGNOSIS — H409 Unspecified glaucoma: Secondary | ICD-10-CM | POA: Diagnosis not present

## 2022-08-23 DIAGNOSIS — Z6837 Body mass index (BMI) 37.0-37.9, adult: Secondary | ICD-10-CM | POA: Diagnosis not present

## 2022-08-23 DIAGNOSIS — I2510 Atherosclerotic heart disease of native coronary artery without angina pectoris: Secondary | ICD-10-CM | POA: Diagnosis not present

## 2022-08-23 DIAGNOSIS — H54414A Blindness right eye category 4, normal vision left eye: Secondary | ICD-10-CM | POA: Diagnosis not present

## 2022-08-23 DIAGNOSIS — B372 Candidiasis of skin and nail: Secondary | ICD-10-CM | POA: Diagnosis not present

## 2022-08-23 DIAGNOSIS — Z7901 Long term (current) use of anticoagulants: Secondary | ICD-10-CM | POA: Diagnosis not present

## 2022-08-23 DIAGNOSIS — S91002D Unspecified open wound, left ankle, subsequent encounter: Secondary | ICD-10-CM | POA: Diagnosis not present

## 2022-08-23 DIAGNOSIS — L299 Pruritus, unspecified: Secondary | ICD-10-CM | POA: Diagnosis not present

## 2022-08-23 DIAGNOSIS — I110 Hypertensive heart disease with heart failure: Secondary | ICD-10-CM | POA: Diagnosis not present

## 2022-08-23 DIAGNOSIS — Z4800 Encounter for change or removal of nonsurgical wound dressing: Secondary | ICD-10-CM | POA: Diagnosis not present

## 2022-08-23 DIAGNOSIS — M8580 Other specified disorders of bone density and structure, unspecified site: Secondary | ICD-10-CM | POA: Diagnosis not present

## 2022-08-23 DIAGNOSIS — B356 Tinea cruris: Secondary | ICD-10-CM | POA: Diagnosis not present

## 2022-08-23 DIAGNOSIS — J449 Chronic obstructive pulmonary disease, unspecified: Secondary | ICD-10-CM | POA: Diagnosis not present

## 2022-08-23 DIAGNOSIS — I5030 Unspecified diastolic (congestive) heart failure: Secondary | ICD-10-CM | POA: Diagnosis not present

## 2022-08-26 DIAGNOSIS — I5030 Unspecified diastolic (congestive) heart failure: Secondary | ICD-10-CM | POA: Diagnosis not present

## 2022-08-26 DIAGNOSIS — B356 Tinea cruris: Secondary | ICD-10-CM | POA: Diagnosis not present

## 2022-08-26 DIAGNOSIS — I110 Hypertensive heart disease with heart failure: Secondary | ICD-10-CM | POA: Diagnosis not present

## 2022-08-26 DIAGNOSIS — S91002D Unspecified open wound, left ankle, subsequent encounter: Secondary | ICD-10-CM | POA: Diagnosis not present

## 2022-08-26 DIAGNOSIS — L299 Pruritus, unspecified: Secondary | ICD-10-CM | POA: Diagnosis not present

## 2022-08-26 DIAGNOSIS — B372 Candidiasis of skin and nail: Secondary | ICD-10-CM | POA: Diagnosis not present

## 2022-08-27 DIAGNOSIS — I5030 Unspecified diastolic (congestive) heart failure: Secondary | ICD-10-CM | POA: Diagnosis not present

## 2022-08-27 DIAGNOSIS — L299 Pruritus, unspecified: Secondary | ICD-10-CM | POA: Diagnosis not present

## 2022-08-27 DIAGNOSIS — I110 Hypertensive heart disease with heart failure: Secondary | ICD-10-CM | POA: Diagnosis not present

## 2022-08-27 DIAGNOSIS — S91002D Unspecified open wound, left ankle, subsequent encounter: Secondary | ICD-10-CM | POA: Diagnosis not present

## 2022-08-27 DIAGNOSIS — B356 Tinea cruris: Secondary | ICD-10-CM | POA: Diagnosis not present

## 2022-08-27 DIAGNOSIS — B372 Candidiasis of skin and nail: Secondary | ICD-10-CM | POA: Diagnosis not present

## 2022-09-02 DIAGNOSIS — L299 Pruritus, unspecified: Secondary | ICD-10-CM | POA: Diagnosis not present

## 2022-09-02 DIAGNOSIS — B356 Tinea cruris: Secondary | ICD-10-CM | POA: Diagnosis not present

## 2022-09-02 DIAGNOSIS — I5030 Unspecified diastolic (congestive) heart failure: Secondary | ICD-10-CM | POA: Diagnosis not present

## 2022-09-02 DIAGNOSIS — I110 Hypertensive heart disease with heart failure: Secondary | ICD-10-CM | POA: Diagnosis not present

## 2022-09-02 DIAGNOSIS — B372 Candidiasis of skin and nail: Secondary | ICD-10-CM | POA: Diagnosis not present

## 2022-09-02 DIAGNOSIS — S91002D Unspecified open wound, left ankle, subsequent encounter: Secondary | ICD-10-CM | POA: Diagnosis not present

## 2022-09-03 DIAGNOSIS — L299 Pruritus, unspecified: Secondary | ICD-10-CM | POA: Diagnosis not present

## 2022-09-03 DIAGNOSIS — B372 Candidiasis of skin and nail: Secondary | ICD-10-CM | POA: Diagnosis not present

## 2022-09-03 DIAGNOSIS — B356 Tinea cruris: Secondary | ICD-10-CM | POA: Diagnosis not present

## 2022-09-03 DIAGNOSIS — S91002D Unspecified open wound, left ankle, subsequent encounter: Secondary | ICD-10-CM | POA: Diagnosis not present

## 2022-09-03 DIAGNOSIS — I5030 Unspecified diastolic (congestive) heart failure: Secondary | ICD-10-CM | POA: Diagnosis not present

## 2022-09-03 DIAGNOSIS — I110 Hypertensive heart disease with heart failure: Secondary | ICD-10-CM | POA: Diagnosis not present

## 2022-09-05 DIAGNOSIS — H2512 Age-related nuclear cataract, left eye: Secondary | ICD-10-CM | POA: Diagnosis not present

## 2022-09-05 DIAGNOSIS — Z9889 Other specified postprocedural states: Secondary | ICD-10-CM | POA: Diagnosis not present

## 2022-09-05 DIAGNOSIS — H402221 Chronic angle-closure glaucoma, left eye, mild stage: Secondary | ICD-10-CM | POA: Diagnosis not present

## 2022-09-09 DIAGNOSIS — L299 Pruritus, unspecified: Secondary | ICD-10-CM | POA: Diagnosis not present

## 2022-09-09 DIAGNOSIS — I110 Hypertensive heart disease with heart failure: Secondary | ICD-10-CM | POA: Diagnosis not present

## 2022-09-09 DIAGNOSIS — I5030 Unspecified diastolic (congestive) heart failure: Secondary | ICD-10-CM | POA: Diagnosis not present

## 2022-09-09 DIAGNOSIS — S91002D Unspecified open wound, left ankle, subsequent encounter: Secondary | ICD-10-CM | POA: Diagnosis not present

## 2022-09-09 DIAGNOSIS — B356 Tinea cruris: Secondary | ICD-10-CM | POA: Diagnosis not present

## 2022-09-09 DIAGNOSIS — B372 Candidiasis of skin and nail: Secondary | ICD-10-CM | POA: Diagnosis not present

## 2022-09-13 DIAGNOSIS — I5030 Unspecified diastolic (congestive) heart failure: Secondary | ICD-10-CM | POA: Diagnosis not present

## 2022-09-13 DIAGNOSIS — Z23 Encounter for immunization: Secondary | ICD-10-CM | POA: Diagnosis not present

## 2022-09-13 DIAGNOSIS — I110 Hypertensive heart disease with heart failure: Secondary | ICD-10-CM | POA: Diagnosis not present

## 2022-09-13 DIAGNOSIS — L299 Pruritus, unspecified: Secondary | ICD-10-CM | POA: Diagnosis not present

## 2022-09-13 DIAGNOSIS — R001 Bradycardia, unspecified: Secondary | ICD-10-CM | POA: Diagnosis not present

## 2022-09-13 DIAGNOSIS — B372 Candidiasis of skin and nail: Secondary | ICD-10-CM | POA: Diagnosis not present

## 2022-09-13 DIAGNOSIS — B356 Tinea cruris: Secondary | ICD-10-CM | POA: Diagnosis not present

## 2022-09-13 DIAGNOSIS — R54 Age-related physical debility: Secondary | ICD-10-CM | POA: Diagnosis not present

## 2022-09-13 DIAGNOSIS — S91002D Unspecified open wound, left ankle, subsequent encounter: Secondary | ICD-10-CM | POA: Diagnosis not present

## 2022-09-13 DIAGNOSIS — Z7189 Other specified counseling: Secondary | ICD-10-CM | POA: Diagnosis not present

## 2022-09-16 DIAGNOSIS — B356 Tinea cruris: Secondary | ICD-10-CM | POA: Diagnosis not present

## 2022-09-16 DIAGNOSIS — I5030 Unspecified diastolic (congestive) heart failure: Secondary | ICD-10-CM | POA: Diagnosis not present

## 2022-09-16 DIAGNOSIS — S91002D Unspecified open wound, left ankle, subsequent encounter: Secondary | ICD-10-CM | POA: Diagnosis not present

## 2022-09-16 DIAGNOSIS — B372 Candidiasis of skin and nail: Secondary | ICD-10-CM | POA: Diagnosis not present

## 2022-09-16 DIAGNOSIS — I110 Hypertensive heart disease with heart failure: Secondary | ICD-10-CM | POA: Diagnosis not present

## 2022-09-16 DIAGNOSIS — L299 Pruritus, unspecified: Secondary | ICD-10-CM | POA: Diagnosis not present

## 2022-09-17 DIAGNOSIS — S91002D Unspecified open wound, left ankle, subsequent encounter: Secondary | ICD-10-CM | POA: Diagnosis not present

## 2022-09-17 DIAGNOSIS — B372 Candidiasis of skin and nail: Secondary | ICD-10-CM | POA: Diagnosis not present

## 2022-09-17 DIAGNOSIS — B356 Tinea cruris: Secondary | ICD-10-CM | POA: Diagnosis not present

## 2022-09-17 DIAGNOSIS — I5030 Unspecified diastolic (congestive) heart failure: Secondary | ICD-10-CM | POA: Diagnosis not present

## 2022-09-17 DIAGNOSIS — L299 Pruritus, unspecified: Secondary | ICD-10-CM | POA: Diagnosis not present

## 2022-09-17 DIAGNOSIS — I110 Hypertensive heart disease with heart failure: Secondary | ICD-10-CM | POA: Diagnosis not present

## 2022-09-22 DIAGNOSIS — I4819 Other persistent atrial fibrillation: Secondary | ICD-10-CM | POA: Diagnosis not present

## 2022-09-22 DIAGNOSIS — H409 Unspecified glaucoma: Secondary | ICD-10-CM | POA: Diagnosis not present

## 2022-09-22 DIAGNOSIS — Z7901 Long term (current) use of anticoagulants: Secondary | ICD-10-CM | POA: Diagnosis not present

## 2022-09-22 DIAGNOSIS — I2510 Atherosclerotic heart disease of native coronary artery without angina pectoris: Secondary | ICD-10-CM | POA: Diagnosis not present

## 2022-09-22 DIAGNOSIS — B356 Tinea cruris: Secondary | ICD-10-CM | POA: Diagnosis not present

## 2022-09-22 DIAGNOSIS — Z4800 Encounter for change or removal of nonsurgical wound dressing: Secondary | ICD-10-CM | POA: Diagnosis not present

## 2022-09-22 DIAGNOSIS — R001 Bradycardia, unspecified: Secondary | ICD-10-CM | POA: Diagnosis not present

## 2022-09-22 DIAGNOSIS — B372 Candidiasis of skin and nail: Secondary | ICD-10-CM | POA: Diagnosis not present

## 2022-09-22 DIAGNOSIS — Z9181 History of falling: Secondary | ICD-10-CM | POA: Diagnosis not present

## 2022-09-22 DIAGNOSIS — J449 Chronic obstructive pulmonary disease, unspecified: Secondary | ICD-10-CM | POA: Diagnosis not present

## 2022-09-22 DIAGNOSIS — H54414A Blindness right eye category 4, normal vision left eye: Secondary | ICD-10-CM | POA: Diagnosis not present

## 2022-09-22 DIAGNOSIS — S91002D Unspecified open wound, left ankle, subsequent encounter: Secondary | ICD-10-CM | POA: Diagnosis not present

## 2022-09-22 DIAGNOSIS — I5030 Unspecified diastolic (congestive) heart failure: Secondary | ICD-10-CM | POA: Diagnosis not present

## 2022-09-22 DIAGNOSIS — Z6837 Body mass index (BMI) 37.0-37.9, adult: Secondary | ICD-10-CM | POA: Diagnosis not present

## 2022-09-22 DIAGNOSIS — E1151 Type 2 diabetes mellitus with diabetic peripheral angiopathy without gangrene: Secondary | ICD-10-CM | POA: Diagnosis not present

## 2022-09-22 DIAGNOSIS — M8580 Other specified disorders of bone density and structure, unspecified site: Secondary | ICD-10-CM | POA: Diagnosis not present

## 2022-09-22 DIAGNOSIS — I110 Hypertensive heart disease with heart failure: Secondary | ICD-10-CM | POA: Diagnosis not present

## 2022-09-22 DIAGNOSIS — L299 Pruritus, unspecified: Secondary | ICD-10-CM | POA: Diagnosis not present

## 2022-09-22 DIAGNOSIS — E785 Hyperlipidemia, unspecified: Secondary | ICD-10-CM | POA: Diagnosis not present

## 2022-09-23 DIAGNOSIS — I110 Hypertensive heart disease with heart failure: Secondary | ICD-10-CM | POA: Diagnosis not present

## 2022-09-23 DIAGNOSIS — I5030 Unspecified diastolic (congestive) heart failure: Secondary | ICD-10-CM | POA: Diagnosis not present

## 2022-09-23 DIAGNOSIS — B372 Candidiasis of skin and nail: Secondary | ICD-10-CM | POA: Diagnosis not present

## 2022-09-23 DIAGNOSIS — B356 Tinea cruris: Secondary | ICD-10-CM | POA: Diagnosis not present

## 2022-09-23 DIAGNOSIS — S91002D Unspecified open wound, left ankle, subsequent encounter: Secondary | ICD-10-CM | POA: Diagnosis not present

## 2022-09-23 DIAGNOSIS — L299 Pruritus, unspecified: Secondary | ICD-10-CM | POA: Diagnosis not present

## 2022-09-24 ENCOUNTER — Telehealth: Payer: Self-pay

## 2022-09-24 DIAGNOSIS — S91002D Unspecified open wound, left ankle, subsequent encounter: Secondary | ICD-10-CM | POA: Diagnosis not present

## 2022-09-24 DIAGNOSIS — I5030 Unspecified diastolic (congestive) heart failure: Secondary | ICD-10-CM | POA: Diagnosis not present

## 2022-09-24 DIAGNOSIS — B372 Candidiasis of skin and nail: Secondary | ICD-10-CM | POA: Diagnosis not present

## 2022-09-24 DIAGNOSIS — I110 Hypertensive heart disease with heart failure: Secondary | ICD-10-CM | POA: Diagnosis not present

## 2022-09-24 DIAGNOSIS — B356 Tinea cruris: Secondary | ICD-10-CM | POA: Diagnosis not present

## 2022-09-24 DIAGNOSIS — L299 Pruritus, unspecified: Secondary | ICD-10-CM | POA: Diagnosis not present

## 2022-09-24 NOTE — Telephone Encounter (Signed)
Returning call from a voicemail received at 1:15 PM today from Candis Schatz, Therapist, sports at [facility]. Amy reports that patient's weight is slightly elevated at 208 lbs today and she is experiencing a little worsening shortness of breathe. States patient has been taking torsemide 20 mg daily.  Provider notfied and left detailed message for Amy, RN, as follows: Patient may take an additional 20 mg tablet of torsemide for a total of 40 mg daily, per order, for shortness of breath and weight gain. Offered patient an appointment this week if needed. Darylene Price can see the patient in office on Thursday, 09/26/22. Requested call back from Amy. [name], RN

## 2022-09-26 DIAGNOSIS — L299 Pruritus, unspecified: Secondary | ICD-10-CM | POA: Diagnosis not present

## 2022-09-26 DIAGNOSIS — R0600 Dyspnea, unspecified: Secondary | ICD-10-CM | POA: Diagnosis not present

## 2022-09-26 DIAGNOSIS — N189 Chronic kidney disease, unspecified: Secondary | ICD-10-CM | POA: Diagnosis not present

## 2022-09-26 DIAGNOSIS — I5030 Unspecified diastolic (congestive) heart failure: Secondary | ICD-10-CM | POA: Diagnosis not present

## 2022-09-26 DIAGNOSIS — I110 Hypertensive heart disease with heart failure: Secondary | ICD-10-CM | POA: Diagnosis not present

## 2022-09-26 DIAGNOSIS — B356 Tinea cruris: Secondary | ICD-10-CM | POA: Diagnosis not present

## 2022-09-26 DIAGNOSIS — I5022 Chronic systolic (congestive) heart failure: Secondary | ICD-10-CM | POA: Diagnosis not present

## 2022-09-26 DIAGNOSIS — B372 Candidiasis of skin and nail: Secondary | ICD-10-CM | POA: Diagnosis not present

## 2022-09-26 DIAGNOSIS — S91002D Unspecified open wound, left ankle, subsequent encounter: Secondary | ICD-10-CM | POA: Diagnosis not present

## 2022-10-01 ENCOUNTER — Encounter: Payer: Self-pay | Admitting: Family

## 2022-10-01 DIAGNOSIS — I110 Hypertensive heart disease with heart failure: Secondary | ICD-10-CM | POA: Diagnosis not present

## 2022-10-01 DIAGNOSIS — S91002D Unspecified open wound, left ankle, subsequent encounter: Secondary | ICD-10-CM | POA: Diagnosis not present

## 2022-10-01 DIAGNOSIS — L299 Pruritus, unspecified: Secondary | ICD-10-CM | POA: Diagnosis not present

## 2022-10-01 DIAGNOSIS — B356 Tinea cruris: Secondary | ICD-10-CM | POA: Diagnosis not present

## 2022-10-01 DIAGNOSIS — B372 Candidiasis of skin and nail: Secondary | ICD-10-CM | POA: Diagnosis not present

## 2022-10-01 DIAGNOSIS — I5030 Unspecified diastolic (congestive) heart failure: Secondary | ICD-10-CM | POA: Diagnosis not present

## 2022-10-02 DIAGNOSIS — R54 Age-related physical debility: Secondary | ICD-10-CM | POA: Diagnosis not present

## 2022-10-02 DIAGNOSIS — E1142 Type 2 diabetes mellitus with diabetic polyneuropathy: Secondary | ICD-10-CM | POA: Diagnosis not present

## 2022-10-02 DIAGNOSIS — D649 Anemia, unspecified: Secondary | ICD-10-CM | POA: Diagnosis not present

## 2022-10-02 DIAGNOSIS — L299 Pruritus, unspecified: Secondary | ICD-10-CM | POA: Diagnosis not present

## 2022-10-02 DIAGNOSIS — B372 Candidiasis of skin and nail: Secondary | ICD-10-CM | POA: Diagnosis not present

## 2022-10-02 DIAGNOSIS — I110 Hypertensive heart disease with heart failure: Secondary | ICD-10-CM | POA: Diagnosis not present

## 2022-10-02 DIAGNOSIS — I5030 Unspecified diastolic (congestive) heart failure: Secondary | ICD-10-CM | POA: Diagnosis not present

## 2022-10-02 DIAGNOSIS — J449 Chronic obstructive pulmonary disease, unspecified: Secondary | ICD-10-CM | POA: Diagnosis not present

## 2022-10-02 DIAGNOSIS — S91002D Unspecified open wound, left ankle, subsequent encounter: Secondary | ICD-10-CM | POA: Diagnosis not present

## 2022-10-02 DIAGNOSIS — B356 Tinea cruris: Secondary | ICD-10-CM | POA: Diagnosis not present

## 2022-10-02 DIAGNOSIS — E785 Hyperlipidemia, unspecified: Secondary | ICD-10-CM | POA: Diagnosis not present

## 2022-10-07 ENCOUNTER — Encounter (INDEPENDENT_AMBULATORY_CARE_PROVIDER_SITE_OTHER): Payer: Self-pay

## 2022-10-07 DIAGNOSIS — I110 Hypertensive heart disease with heart failure: Secondary | ICD-10-CM | POA: Diagnosis not present

## 2022-10-07 DIAGNOSIS — L299 Pruritus, unspecified: Secondary | ICD-10-CM | POA: Diagnosis not present

## 2022-10-07 DIAGNOSIS — B372 Candidiasis of skin and nail: Secondary | ICD-10-CM | POA: Diagnosis not present

## 2022-10-07 DIAGNOSIS — B356 Tinea cruris: Secondary | ICD-10-CM | POA: Diagnosis not present

## 2022-10-07 DIAGNOSIS — S91002D Unspecified open wound, left ankle, subsequent encounter: Secondary | ICD-10-CM | POA: Diagnosis not present

## 2022-10-07 DIAGNOSIS — I5030 Unspecified diastolic (congestive) heart failure: Secondary | ICD-10-CM | POA: Diagnosis not present

## 2022-10-09 DIAGNOSIS — I5030 Unspecified diastolic (congestive) heart failure: Secondary | ICD-10-CM | POA: Diagnosis not present

## 2022-10-09 DIAGNOSIS — L299 Pruritus, unspecified: Secondary | ICD-10-CM | POA: Diagnosis not present

## 2022-10-09 DIAGNOSIS — B372 Candidiasis of skin and nail: Secondary | ICD-10-CM | POA: Diagnosis not present

## 2022-10-09 DIAGNOSIS — B356 Tinea cruris: Secondary | ICD-10-CM | POA: Diagnosis not present

## 2022-10-09 DIAGNOSIS — S91002D Unspecified open wound, left ankle, subsequent encounter: Secondary | ICD-10-CM | POA: Diagnosis not present

## 2022-10-09 DIAGNOSIS — I110 Hypertensive heart disease with heart failure: Secondary | ICD-10-CM | POA: Diagnosis not present

## 2022-10-11 DIAGNOSIS — I5030 Unspecified diastolic (congestive) heart failure: Secondary | ICD-10-CM | POA: Diagnosis not present

## 2022-10-11 DIAGNOSIS — F331 Major depressive disorder, recurrent, moderate: Secondary | ICD-10-CM | POA: Diagnosis not present

## 2022-10-11 DIAGNOSIS — R54 Age-related physical debility: Secondary | ICD-10-CM | POA: Diagnosis not present

## 2022-10-11 DIAGNOSIS — R001 Bradycardia, unspecified: Secondary | ICD-10-CM | POA: Diagnosis not present

## 2022-10-11 DIAGNOSIS — F4320 Adjustment disorder, unspecified: Secondary | ICD-10-CM | POA: Diagnosis not present

## 2022-10-17 DIAGNOSIS — B372 Candidiasis of skin and nail: Secondary | ICD-10-CM | POA: Diagnosis not present

## 2022-10-17 DIAGNOSIS — S91002D Unspecified open wound, left ankle, subsequent encounter: Secondary | ICD-10-CM | POA: Diagnosis not present

## 2022-10-17 DIAGNOSIS — I110 Hypertensive heart disease with heart failure: Secondary | ICD-10-CM | POA: Diagnosis not present

## 2022-10-17 DIAGNOSIS — B356 Tinea cruris: Secondary | ICD-10-CM | POA: Diagnosis not present

## 2022-10-17 DIAGNOSIS — I5030 Unspecified diastolic (congestive) heart failure: Secondary | ICD-10-CM | POA: Diagnosis not present

## 2022-10-17 DIAGNOSIS — L299 Pruritus, unspecified: Secondary | ICD-10-CM | POA: Diagnosis not present

## 2022-10-22 DIAGNOSIS — I110 Hypertensive heart disease with heart failure: Secondary | ICD-10-CM | POA: Diagnosis not present

## 2022-10-22 DIAGNOSIS — J449 Chronic obstructive pulmonary disease, unspecified: Secondary | ICD-10-CM | POA: Diagnosis not present

## 2022-10-22 DIAGNOSIS — E785 Hyperlipidemia, unspecified: Secondary | ICD-10-CM | POA: Diagnosis not present

## 2022-10-22 DIAGNOSIS — H54414A Blindness right eye category 4, normal vision left eye: Secondary | ICD-10-CM | POA: Diagnosis not present

## 2022-10-22 DIAGNOSIS — H409 Unspecified glaucoma: Secondary | ICD-10-CM | POA: Diagnosis not present

## 2022-10-22 DIAGNOSIS — B372 Candidiasis of skin and nail: Secondary | ICD-10-CM | POA: Diagnosis not present

## 2022-10-22 DIAGNOSIS — Z7901 Long term (current) use of anticoagulants: Secondary | ICD-10-CM | POA: Diagnosis not present

## 2022-10-22 DIAGNOSIS — I4819 Other persistent atrial fibrillation: Secondary | ICD-10-CM | POA: Diagnosis not present

## 2022-10-22 DIAGNOSIS — I5030 Unspecified diastolic (congestive) heart failure: Secondary | ICD-10-CM | POA: Diagnosis not present

## 2022-10-22 DIAGNOSIS — Z6837 Body mass index (BMI) 37.0-37.9, adult: Secondary | ICD-10-CM | POA: Diagnosis not present

## 2022-10-22 DIAGNOSIS — B356 Tinea cruris: Secondary | ICD-10-CM | POA: Diagnosis not present

## 2022-10-22 DIAGNOSIS — L299 Pruritus, unspecified: Secondary | ICD-10-CM | POA: Diagnosis not present

## 2022-10-22 DIAGNOSIS — E1151 Type 2 diabetes mellitus with diabetic peripheral angiopathy without gangrene: Secondary | ICD-10-CM | POA: Diagnosis not present

## 2022-10-22 DIAGNOSIS — I2510 Atherosclerotic heart disease of native coronary artery without angina pectoris: Secondary | ICD-10-CM | POA: Diagnosis not present

## 2022-10-22 DIAGNOSIS — Z9181 History of falling: Secondary | ICD-10-CM | POA: Diagnosis not present

## 2022-10-22 DIAGNOSIS — R001 Bradycardia, unspecified: Secondary | ICD-10-CM | POA: Diagnosis not present

## 2022-10-22 DIAGNOSIS — M8580 Other specified disorders of bone density and structure, unspecified site: Secondary | ICD-10-CM | POA: Diagnosis not present

## 2022-10-22 NOTE — Progress Notes (Deleted)
Patient ID: Jenny [name], female    DOB: 07-Apr-1933, 88 y.o.   MRN: [id number]  HPI  Jenny [name] is a 88 y/o female with a history of CAD, hyperlipidemia, HTN, anemia, COPD, glaucoma, atrial fibrillation, recent tobacco use and chronic heart failure.   Echo report from 08/22/21 reviewed and showed an EF of 60-65% along with mild LVH/ LAE, mild MR and severely elevated PA pressure of 65.1 mmHg.   Has not been admitted or been in the ED in the last 6 months. Cardioversion 01/30/22  She presents today for a follow-up visit with a chief complaint of   Past Medical History:  Diagnosis Date   ACUT DUOD ULCER W/HEMORR&PERF W/O MENTION OBST 10/05/2009   NSAID induced   ALLERGIC RHINITIS CAUSE UNSPECIFIED    ANEMIA-NOS    CAD (coronary artery disease) 06/08/2009   DEs RCA with 70% LAD and EF 60%   CHF (congestive heart failure) (HCC)    COPD    mild obst on PFTs 03/2010   Diabetes mellitus 06/2010 dx   Mild, diet controlled   GLAUCOMA    HYPERLIPIDEMIA    HYPERTENSION, BENIGN    MYOCARDIAL INFARCTION 06/08/2009   des to rca   Persistent atrial fibrillation (HCC)    Dx 08/2021   TOBACCO ABUSE    Past Surgical History:  Procedure Laterality Date   CARDIOVERSION N/A 01/30/2022   Procedure: CARDIOVERSION;  Surgeon: Elease Hashimoto Deloris Ping, MD;  Location: [facility] ENDOSCOPY;  Service: Cardiovascular;  Laterality: N/A;   HEMORRHOID SURGERY  1990   Right knee surgery     Family History  Problem Relation Age of Onset   Hypertension Mother    Ulcers Mother    Stomach cancer Father    Stroke Maternal Grandmother    Heart attack Neg Hx    Social History   Tobacco Use   Smoking status: Former    Packs/day: 0.50    Years: 60.00    Total pack years: 30.00    Types: Cigarettes    Quit date: 11/12/2021    Years since quitting: 0.9   Smokeless tobacco: Never   Tobacco comments:    She lives in [place] with her significant other (Jenny [name])0  Substance Use Topics   Alcohol use: Yes    Alcohol/week:  1.0 - 2.0 standard drink of alcohol    Types: 1 - 2 Glasses of wine per week    Comment: once or twice a year glass of wine 12/25/21   Allergies  Allergen Reactions   Aspirin Other (See Comments)    Can take 81 mg not 325 mg   Atorvastatin Itching    Itching and myaliga   Cyclobenzaprine     Other reaction(s): muscle relaxers-ulcer hemorrhage (hospitalized)   Lactose Intolerance (Gi) Other (See Comments)    GI upset   Meperidine Hcl Other (See Comments)    Not known   Pravastatin Rash    rash   Propoxyphene     Other reaction(s): pain meds, hallucination, vomiting   Wound Dressing Adhesive     Other reaction(s): red, stings      Review of Systems  Constitutional:  Positive for fatigue. Negative for appetite change.  HENT:  Negative for congestion, postnasal drip and sore throat.   Eyes: Negative.   Respiratory:  Positive for cough and shortness of breath (with moderate exertion). Negative for chest tightness and wheezing.   Cardiovascular:  Negative for chest pain, palpitations and leg swelling.  Gastrointestinal:  Negative  for abdominal distention and abdominal pain.  Endocrine: Negative.   Genitourinary: Negative.   Musculoskeletal:  Negative for back pain and neck pain.  Skin: Negative.   Allergic/Immunologic: Negative.   Neurological:  Negative for dizziness and light-headedness.  Hematological:  Negative for adenopathy. Bruises/bleeds easily.  Psychiatric/Behavioral:  Positive for sleep disturbance (chronic issue; sleeping sitting on sofa). Negative for dysphoric mood. The patient is not nervous/anxious.        Physical Exam Vitals and nursing note reviewed. Exam conducted with a chaperone present (son).  Constitutional:      Appearance: Normal appearance.  HENT:     Head: Normocephalic.  Cardiovascular:     Rate and Rhythm: Bradycardia present. Rhythm irregular.  Pulmonary:     Effort: Pulmonary effort is normal. No respiratory distress.     Breath sounds:  No wheezing or rales.  Abdominal:     General: There is no distension.     Palpations: Abdomen is soft.     Tenderness: There is no abdominal tenderness.  Musculoskeletal:        General: No tenderness.     Cervical back: Normal range of motion and neck supple.     Right lower leg: No edema.     Left lower leg: No edema.  Skin:    General: Skin is warm and dry.  Neurological:     General: No focal deficit present.     Mental Status: She is alert and oriented to person, place, and time.  Psychiatric:        Mood and Affect: Mood normal.        Behavior: Behavior normal.        Thought Content: Thought content normal.    Assessment & Plan:  1: Chronic heart failure with preserved ejection fraction with structural changes (LVH/LAE)- - NYHA class II - euvolemic today - weighing daily; reminded to call for an overnight weight gain of > 2 pounds or a weekly weight gain of > 5 pounds - weight up 5 pounds from last visit here 3 months ago; says that it's been gradual and she regained her weight after having covid - not adding salt to her food and tries to eat low sodium foods  - saw cardiology Jenny [name]) 08/02/22 - stopped her entresto at last visit due to worsening SOB - BNP 12/07/21 was 371.3  2: HTN- - BP - seeing [facility] for primary care every 2 weeks - BMP 02/21/22 reviewed and showed sodium 137, potassium 4.5, creatinine 1.31 and GFR 40 - saw nephrology Jenny [name]) 02/21/22  3: Persistent atrial fibrillation- - saw EP Jenny [name]) 03/28/22 - had cardioversion 01/31/22 & post cardioversion, developed Mobitz 2nd degree AV block - emphasized NOT stopping her eliquis due to risk of a clot and increased risk of stroke; explained that eliquis and plavix are not the same and eliquis is best indicated for her; she will discuss further with cardiologist but says that she will stay on eliquis for now  4: COPD- - stopped smoking fall 2022 - continued cessation discussed for 3 minutes   - using trelegy daily and nebulizer PRN   Medication bottles reviewed.      Marland Kitchen

## 2022-10-23 ENCOUNTER — Ambulatory Visit: Payer: Medicare Other | Admitting: Family

## 2022-10-29 DIAGNOSIS — B372 Candidiasis of skin and nail: Secondary | ICD-10-CM | POA: Diagnosis not present

## 2022-10-29 DIAGNOSIS — I110 Hypertensive heart disease with heart failure: Secondary | ICD-10-CM | POA: Diagnosis not present

## 2022-10-29 DIAGNOSIS — I5030 Unspecified diastolic (congestive) heart failure: Secondary | ICD-10-CM | POA: Diagnosis not present

## 2022-10-29 DIAGNOSIS — L299 Pruritus, unspecified: Secondary | ICD-10-CM | POA: Diagnosis not present

## 2022-10-29 DIAGNOSIS — B356 Tinea cruris: Secondary | ICD-10-CM | POA: Diagnosis not present

## 2022-10-29 DIAGNOSIS — I2510 Atherosclerotic heart disease of native coronary artery without angina pectoris: Secondary | ICD-10-CM | POA: Diagnosis not present

## 2022-11-01 DIAGNOSIS — J449 Chronic obstructive pulmonary disease, unspecified: Secondary | ICD-10-CM | POA: Diagnosis not present

## 2022-11-01 DIAGNOSIS — I5030 Unspecified diastolic (congestive) heart failure: Secondary | ICD-10-CM | POA: Diagnosis not present

## 2022-11-01 DIAGNOSIS — R54 Age-related physical debility: Secondary | ICD-10-CM | POA: Diagnosis not present

## 2022-11-01 DIAGNOSIS — E1142 Type 2 diabetes mellitus with diabetic polyneuropathy: Secondary | ICD-10-CM | POA: Diagnosis not present

## 2022-11-01 DIAGNOSIS — D649 Anemia, unspecified: Secondary | ICD-10-CM | POA: Diagnosis not present

## 2022-11-01 DIAGNOSIS — F331 Major depressive disorder, recurrent, moderate: Secondary | ICD-10-CM | POA: Diagnosis not present

## 2022-11-01 DIAGNOSIS — E785 Hyperlipidemia, unspecified: Secondary | ICD-10-CM | POA: Diagnosis not present

## 2022-11-01 DIAGNOSIS — E1169 Type 2 diabetes mellitus with other specified complication: Secondary | ICD-10-CM | POA: Diagnosis not present

## 2022-11-05 DIAGNOSIS — I2510 Atherosclerotic heart disease of native coronary artery without angina pectoris: Secondary | ICD-10-CM | POA: Diagnosis not present

## 2022-11-05 DIAGNOSIS — I5030 Unspecified diastolic (congestive) heart failure: Secondary | ICD-10-CM | POA: Diagnosis not present

## 2022-11-05 DIAGNOSIS — B372 Candidiasis of skin and nail: Secondary | ICD-10-CM | POA: Diagnosis not present

## 2022-11-05 DIAGNOSIS — I110 Hypertensive heart disease with heart failure: Secondary | ICD-10-CM | POA: Diagnosis not present

## 2022-11-05 DIAGNOSIS — B356 Tinea cruris: Secondary | ICD-10-CM | POA: Diagnosis not present

## 2022-11-05 DIAGNOSIS — L299 Pruritus, unspecified: Secondary | ICD-10-CM | POA: Diagnosis not present

## 2022-11-07 ENCOUNTER — Encounter: Payer: Self-pay | Admitting: Family

## 2022-11-07 ENCOUNTER — Ambulatory Visit: Payer: Medicare Other | Attending: Family | Admitting: Family

## 2022-11-07 VITALS — BP 112/81 | HR 50 | Resp 18 | Wt 218.0 lb

## 2022-11-07 DIAGNOSIS — G479 Sleep disorder, unspecified: Secondary | ICD-10-CM | POA: Insufficient documentation

## 2022-11-07 DIAGNOSIS — I441 Atrioventricular block, second degree: Secondary | ICD-10-CM | POA: Diagnosis not present

## 2022-11-07 DIAGNOSIS — I110 Hypertensive heart disease with heart failure: Secondary | ICD-10-CM | POA: Diagnosis not present

## 2022-11-07 DIAGNOSIS — H409 Unspecified glaucoma: Secondary | ICD-10-CM | POA: Insufficient documentation

## 2022-11-07 DIAGNOSIS — I5032 Chronic diastolic (congestive) heart failure: Secondary | ICD-10-CM

## 2022-11-07 DIAGNOSIS — Z79899 Other long term (current) drug therapy: Secondary | ICD-10-CM | POA: Insufficient documentation

## 2022-11-07 DIAGNOSIS — R0602 Shortness of breath: Secondary | ICD-10-CM | POA: Insufficient documentation

## 2022-11-07 DIAGNOSIS — Z87891 Personal history of nicotine dependence: Secondary | ICD-10-CM | POA: Diagnosis not present

## 2022-11-07 DIAGNOSIS — E785 Hyperlipidemia, unspecified: Secondary | ICD-10-CM | POA: Insufficient documentation

## 2022-11-07 DIAGNOSIS — I2510 Atherosclerotic heart disease of native coronary artery without angina pectoris: Secondary | ICD-10-CM | POA: Insufficient documentation

## 2022-11-07 DIAGNOSIS — I10 Essential (primary) hypertension: Secondary | ICD-10-CM

## 2022-11-07 DIAGNOSIS — I4819 Other persistent atrial fibrillation: Secondary | ICD-10-CM | POA: Diagnosis not present

## 2022-11-07 DIAGNOSIS — I480 Paroxysmal atrial fibrillation: Secondary | ICD-10-CM | POA: Diagnosis not present

## 2022-11-07 DIAGNOSIS — D649 Anemia, unspecified: Secondary | ICD-10-CM | POA: Insufficient documentation

## 2022-11-07 DIAGNOSIS — J449 Chronic obstructive pulmonary disease, unspecified: Secondary | ICD-10-CM

## 2022-11-07 NOTE — Patient Instructions (Signed)
Continue weighing daily and call for an overnight weight gain of 3 pounds or more or a weekly weight gain of more than 5 pounds.   If you have voicemail, please make sure your mailbox is cleaned out so that we may leave a message and please make sure to listen to any voicemails.     

## 2022-11-07 NOTE — Progress Notes (Signed)
Patient ID: Jenny [name], female    DOB: Mar 04, 1933, 88 y.o.   MRN: [id number]  HPI  Jenny [name] is a 88 y/o female with a history of CAD, hyperlipidemia, HTN, anemia, COPD, glaucoma, atrial fibrillation, recent tobacco use and chronic heart failure.   Echo report from 08/22/21 reviewed and showed an EF of 60-65% along with mild LVH/ LAE, mild MR and severely elevated PA pressure of 65.1 mmHg.   Has not been admitted or been in the ED in the last 6 months. Cardioversion 01/30/22  She presents today for a follow-up visit with a chief complaint of minimal fatigue with moderate exertion. Describes this as chronic in nature. She has associated shortness of breath and chronic difficulty sleeping along with this. She denies any dizziness, abdominal distention, palpitations, pedal edema, chest pain, wheezing, cough or weight gain.   Weighing daily and says that her home weight ranges from 202-206 pounds. Reports being upset because her cat had to be euthanized 3 weeks ago.   Past Medical History:  Diagnosis Date   ACUT DUOD ULCER W/HEMORR&PERF W/O MENTION OBST 10/05/2009   NSAID induced   ALLERGIC RHINITIS CAUSE UNSPECIFIED    ANEMIA-NOS    CAD (coronary artery disease) 06/08/2009   DEs RCA with 70% LAD and EF 60%   CHF (congestive heart failure) (HCC)    COPD    mild obst on PFTs 03/2010   Diabetes mellitus 06/2010 dx   Mild, diet controlled   GLAUCOMA    HYPERLIPIDEMIA    HYPERTENSION, BENIGN    MYOCARDIAL INFARCTION 06/08/2009   des to rca   Persistent atrial fibrillation (HCC)    Dx 08/2021   TOBACCO ABUSE    Past Surgical History:  Procedure Laterality Date   CARDIOVERSION N/A 01/30/2022   Procedure: CARDIOVERSION;  Surgeon: Jenny Hashimoto Deloris Ping, MD;  Location: [facility] ENDOSCOPY;  Service: Cardiovascular;  Laterality: N/A;   HEMORRHOID SURGERY  1990   Right knee surgery     Family History  Problem Relation Age of Onset   Hypertension Mother    Ulcers Mother    Stomach cancer Father     Stroke Maternal Grandmother    Heart attack Neg Hx    Social History   Tobacco Use   Smoking status: Former    Packs/day: 0.50    Years: 60.00    Total pack years: 30.00    Types: Cigarettes    Quit date: 11/12/2021    Years since quitting: 0.9   Smokeless tobacco: Never   Tobacco comments:    She lives in [place] with her significant other (Jenny [name])0  Substance Use Topics   Alcohol use: Yes    Alcohol/week: 1.0 - 2.0 standard drink of alcohol    Types: 1 - 2 Glasses of wine per week    Comment: once or twice a year glass of wine 12/25/21   Allergies  Allergen Reactions   Aspirin Other (See Comments)    Can take 81 mg not 325 mg   Atorvastatin Itching    Itching and myaliga   Cyclobenzaprine     Other reaction(s): muscle relaxers-ulcer hemorrhage (hospitalized)   Lactose Intolerance (Gi) Other (See Comments)    GI upset   Meperidine Hcl Other (See Comments)    Not known   Pravastatin Rash    rash   Propoxyphene     Other reaction(s): pain meds, hallucination, vomiting   Wound Dressing Adhesive     Other reaction(s): red, stings  Prior to Admission medications   Medication Sig Start Date End Date Taking? Authorizing Provider  acetaminophen (TYLENOL) 500 MG tablet Take 500 mg by mouth every 6 (six) hours as needed. Takes two tablets in the morning and two tablets at night   Yes [provider]  albuterol (VENTOLIN HFA) 108 (90 Base) MCG/ACT inhaler INHALE ONE PUFF EVERY 6 HOURS AS NEEDED FOR SHORTNESS OF BREATH 11/03/21  Yes Gilles Chiquito, MD  apixaban (ELIQUIS) 5 MG TABS tablet Take 1 tablet (5 mg total) by mouth 2 (two) times daily. 10/11/21  Yes Turner, Cornelious Bryant, MD  dorzolamide (TRUSOPT) 2 % ophthalmic solution Place 1 drop into both eyes 2 (two) times daily.   Yes [provider]  famotidine (PEPCID) 20 MG tablet Take 20 mg by mouth 2 (two) times daily as needed for heartburn.   Yes [provider]  Fluticasone-Umeclidin-Vilant  (TRELEGY ELLIPTA) 100-62.5-25 MCG/INH AEPB Inhale 1 puff into the lungs daily. 08/27/21  Yes Almon Hercules, MD  gabapentin (NEURONTIN) 100 MG capsule Take 100-200 mg by mouth See admin instructions. Take 1 capsule (100mg ) by mouth every morning and take 2 capsules (200mg ) by mouth at bedtime   Yes [provider]  hydrOXYzine (ATARAX) 25 MG tablet Take 25 mg by mouth every 8 (eight) hours as needed for itching.   Yes [provider]  ipratropium-albuterol (DUONEB) 0.5-2.5 (3) MG/3ML SOLN Take 3 mLs by nebulization every 6 (six) hours as needed. 11/06/21  Yes , MD  latanoprost (XALATAN) 0.005 % ophthalmic solution Place 1 drop into both eyes in the morning.   Yes [provider]  magnesium hydroxide (MILK OF MAGNESIA) 400 MG/5ML suspension Take 30 mLs by mouth as needed for mild constipation.   Yes [provider]  NITROSTAT 0.4 MG SL tablet DISSOLVE ONE TABLET UNDER TONGUE AS NEEDED FOR CHEST PAIN - MAY REPEAT TWICE-IF NO RELIEF GO TO NEAREST HOSPITAL ER 05/06/13  Yes Enedina Finner, MD  nystatin (MYCOSTATIN/NYSTOP) powder Apply 1 application topically daily. 01/22/22  Yes [provider]  rosuvastatin (CRESTOR) 20 MG tablet Take 20 mg by mouth daily.   Yes [provider]  Torsemide 40 MG TABS Take 20 mg by mouth daily. And additional 20mg  if needed for weight gain, shortness of breath or swelling 04/16/22  Yes Jamarcus Laduke A, FNP  sacubitril-valsartan (ENTRESTO) 24-26 MG Take 1 tablet by mouth 2 (two) times daily. Patient not taking: Reported on 08/02/2022 04/15/22   06/16/22, FNP   Review of Systems  Constitutional:  Positive for fatigue. Negative for appetite change.  HENT:  Negative for congestion, postnasal drip and sore throat.   Eyes: Negative.   Respiratory:  Positive for shortness of breath. Negative for cough, chest tightness and wheezing.   Cardiovascular:  Negative for chest pain, palpitations and leg swelling.   Gastrointestinal:  Negative for abdominal distention and abdominal pain.  Endocrine: Negative.   Genitourinary: Negative.   Musculoskeletal:  Negative for back pain.  Skin: Negative.   Allergic/Immunologic: Negative.   Neurological:  Negative for dizziness and light-headedness.  Hematological:  Negative for adenopathy. Bruises/bleeds easily.  Psychiatric/Behavioral:  Positive for sleep disturbance (chronic issue; sleeping sitting on sofa). Negative for dysphoric mood. The patient is not nervous/anxious.    Vitals:   11/07/22 1433  BP: 112/81  Pulse: (!) 50  Resp: 18  SpO2: 100%  Weight: 218 lb (98.9 kg)   Wt Readings from Last 3 Encounters:  11/07/22 218 lb (  98.9 kg)  08/02/22 209 lb 9.6 oz (95.1 kg)  07/22/22 212 lb 8 oz (96.4 kg)   Lab Results  Component Value Date   CREATININE 1.24 (H) 01/30/2022   CREATININE 1.36 (H) 01/14/2022   CREATININE 1.18 (H) 01/03/2022    Physical Exam Vitals and nursing note reviewed. Exam conducted with a chaperone present (son).  Constitutional:      Appearance: Normal appearance.  HENT:     Head: Normocephalic.  Cardiovascular:     Rate and Rhythm: Bradycardia present. Rhythm irregular.  Pulmonary:     Effort: Pulmonary effort is normal. No respiratory distress.     Breath sounds: No wheezing or rales.  Abdominal:     General: There is no distension.     Palpations: Abdomen is soft.     Tenderness: There is no abdominal tenderness.  Musculoskeletal:        General: No tenderness.     Cervical back: Normal range of motion and neck supple.     Right lower leg: No edema.     Left lower leg: No edema.  Skin:    General: Skin is warm and dry.  Neurological:     General: No focal deficit present.     Mental Status: She is alert and oriented to person, place, and time.  Psychiatric:        Mood and Affect: Mood normal.        Behavior: Behavior normal.        Thought Content: Thought content normal.    Assessment & Plan:  1:  Chronic heart failure with preserved ejection fraction with structural changes (LVH/LAE)- - NYHA class II - euvolemic today - weighing daily and she says that her home weight ranges from 202-206 pounds; reminded to call for an overnight weight gain of > 2 pounds or a weekly weight gain of > 5 pounds - weight up 6 pounds from last visit here 3 months ago - not adding salt to her food and tries to eat low sodium foods  - normally takes 20mg  torsemide with additional 20mg  but hasn't taken any extra recently; advised her to take an additional 20mg  today - saw cardiology ) 08/02/22 - SOB worsened when taking entresto; consider SGLT2 - BNP 12/07/21 was 371.3  2: HTN- - BP looks good (112/81) - seeing [facility] for primary care every 2 weeks - BMP 02/21/22 reviewed and showed sodium 137, potassium 4.5, creatinine 1.31 and GFR 40 - saw nephrology (Korrapati) 02/21/22  3: Persistent atrial fibrillation- - saw EP 11-10-1995) 03/28/22 - had cardioversion 01/31/22 & post cardioversion, developed Mobitz 2nd degree AV block  4: COPD- - stopped smoking fall 2022 - continued cessation discussed for 3 minutes  - using trelegy daily and nebulizer PRN   Medication bottles reviewed.   Return in 3 months, sooner if needed.

## 2022-11-08 ENCOUNTER — Encounter: Payer: Self-pay | Admitting: Family

## 2022-11-08 DIAGNOSIS — R54 Age-related physical debility: Secondary | ICD-10-CM | POA: Diagnosis not present

## 2022-11-08 DIAGNOSIS — I10 Essential (primary) hypertension: Secondary | ICD-10-CM | POA: Diagnosis not present

## 2022-11-08 DIAGNOSIS — K5900 Constipation, unspecified: Secondary | ICD-10-CM | POA: Diagnosis not present

## 2022-11-08 DIAGNOSIS — R001 Bradycardia, unspecified: Secondary | ICD-10-CM | POA: Diagnosis not present

## 2022-11-08 DIAGNOSIS — F4321 Adjustment disorder with depressed mood: Secondary | ICD-10-CM | POA: Diagnosis not present

## 2022-11-13 DIAGNOSIS — B372 Candidiasis of skin and nail: Secondary | ICD-10-CM | POA: Diagnosis not present

## 2022-11-13 DIAGNOSIS — I5030 Unspecified diastolic (congestive) heart failure: Secondary | ICD-10-CM | POA: Diagnosis not present

## 2022-11-13 DIAGNOSIS — L299 Pruritus, unspecified: Secondary | ICD-10-CM | POA: Diagnosis not present

## 2022-11-13 DIAGNOSIS — B356 Tinea cruris: Secondary | ICD-10-CM | POA: Diagnosis not present

## 2022-11-13 DIAGNOSIS — I110 Hypertensive heart disease with heart failure: Secondary | ICD-10-CM | POA: Diagnosis not present

## 2022-11-13 DIAGNOSIS — I2510 Atherosclerotic heart disease of native coronary artery without angina pectoris: Secondary | ICD-10-CM | POA: Diagnosis not present

## 2022-11-20 DIAGNOSIS — L299 Pruritus, unspecified: Secondary | ICD-10-CM | POA: Diagnosis not present

## 2022-11-20 DIAGNOSIS — I110 Hypertensive heart disease with heart failure: Secondary | ICD-10-CM | POA: Diagnosis not present

## 2022-11-20 DIAGNOSIS — I2510 Atherosclerotic heart disease of native coronary artery without angina pectoris: Secondary | ICD-10-CM | POA: Diagnosis not present

## 2022-11-20 DIAGNOSIS — B356 Tinea cruris: Secondary | ICD-10-CM | POA: Diagnosis not present

## 2022-11-20 DIAGNOSIS — N189 Chronic kidney disease, unspecified: Secondary | ICD-10-CM | POA: Diagnosis not present

## 2022-11-20 DIAGNOSIS — B372 Candidiasis of skin and nail: Secondary | ICD-10-CM | POA: Diagnosis not present

## 2022-11-20 DIAGNOSIS — I5030 Unspecified diastolic (congestive) heart failure: Secondary | ICD-10-CM | POA: Diagnosis not present

## 2022-11-20 DIAGNOSIS — I5022 Chronic systolic (congestive) heart failure: Secondary | ICD-10-CM | POA: Diagnosis not present

## 2022-11-20 DIAGNOSIS — R0600 Dyspnea, unspecified: Secondary | ICD-10-CM | POA: Diagnosis not present

## 2022-11-21 DIAGNOSIS — I4819 Other persistent atrial fibrillation: Secondary | ICD-10-CM | POA: Diagnosis not present

## 2022-11-21 DIAGNOSIS — Z7901 Long term (current) use of anticoagulants: Secondary | ICD-10-CM | POA: Diagnosis not present

## 2022-11-21 DIAGNOSIS — Z6837 Body mass index (BMI) 37.0-37.9, adult: Secondary | ICD-10-CM | POA: Diagnosis not present

## 2022-11-21 DIAGNOSIS — E785 Hyperlipidemia, unspecified: Secondary | ICD-10-CM | POA: Diagnosis not present

## 2022-11-21 DIAGNOSIS — L299 Pruritus, unspecified: Secondary | ICD-10-CM | POA: Diagnosis not present

## 2022-11-21 DIAGNOSIS — R001 Bradycardia, unspecified: Secondary | ICD-10-CM | POA: Diagnosis not present

## 2022-11-21 DIAGNOSIS — J449 Chronic obstructive pulmonary disease, unspecified: Secondary | ICD-10-CM | POA: Diagnosis not present

## 2022-11-21 DIAGNOSIS — H54414A Blindness right eye category 4, normal vision left eye: Secondary | ICD-10-CM | POA: Diagnosis not present

## 2022-11-21 DIAGNOSIS — B372 Candidiasis of skin and nail: Secondary | ICD-10-CM | POA: Diagnosis not present

## 2022-11-21 DIAGNOSIS — B356 Tinea cruris: Secondary | ICD-10-CM | POA: Diagnosis not present

## 2022-11-21 DIAGNOSIS — Z9181 History of falling: Secondary | ICD-10-CM | POA: Diagnosis not present

## 2022-11-21 DIAGNOSIS — M8580 Other specified disorders of bone density and structure, unspecified site: Secondary | ICD-10-CM | POA: Diagnosis not present

## 2022-11-21 DIAGNOSIS — I2510 Atherosclerotic heart disease of native coronary artery without angina pectoris: Secondary | ICD-10-CM | POA: Diagnosis not present

## 2022-11-21 DIAGNOSIS — H409 Unspecified glaucoma: Secondary | ICD-10-CM | POA: Diagnosis not present

## 2022-11-21 DIAGNOSIS — I5030 Unspecified diastolic (congestive) heart failure: Secondary | ICD-10-CM | POA: Diagnosis not present

## 2022-11-21 DIAGNOSIS — E1151 Type 2 diabetes mellitus with diabetic peripheral angiopathy without gangrene: Secondary | ICD-10-CM | POA: Diagnosis not present

## 2022-11-21 DIAGNOSIS — I110 Hypertensive heart disease with heart failure: Secondary | ICD-10-CM | POA: Diagnosis not present

## 2022-11-22 ENCOUNTER — Telehealth: Payer: Self-pay | Admitting: Family

## 2022-11-22 DIAGNOSIS — F331 Major depressive disorder, recurrent, moderate: Secondary | ICD-10-CM | POA: Diagnosis not present

## 2022-11-22 DIAGNOSIS — R54 Age-related physical debility: Secondary | ICD-10-CM | POA: Diagnosis not present

## 2022-11-22 DIAGNOSIS — E785 Hyperlipidemia, unspecified: Secondary | ICD-10-CM | POA: Diagnosis not present

## 2022-11-22 DIAGNOSIS — D649 Anemia, unspecified: Secondary | ICD-10-CM | POA: Diagnosis not present

## 2022-11-22 DIAGNOSIS — H409 Unspecified glaucoma: Secondary | ICD-10-CM | POA: Diagnosis not present

## 2022-11-22 DIAGNOSIS — I5030 Unspecified diastolic (congestive) heart failure: Secondary | ICD-10-CM | POA: Diagnosis not present

## 2022-11-22 DIAGNOSIS — E1142 Type 2 diabetes mellitus with diabetic polyneuropathy: Secondary | ICD-10-CM | POA: Diagnosis not present

## 2022-11-22 DIAGNOSIS — J449 Chronic obstructive pulmonary disease, unspecified: Secondary | ICD-10-CM | POA: Diagnosis not present

## 2022-11-22 NOTE — Telephone Encounter (Signed)
Received BMP/pro-BNP results from home health after she developed weight gain 2 days ago and took 7m torsemide. To take 410mtorsemide daily until her weight returns to her baseline of 200 pounds (currently she is 210 pounds). Labs will also be checked next week.   BMP dated 11/20/22: BUN 36 Creatinine 1.42 eGFR 36 Sodium 142 Potassium 4.9  ProBNP was 1058 on 11/20/22

## 2022-11-27 DIAGNOSIS — I2510 Atherosclerotic heart disease of native coronary artery without angina pectoris: Secondary | ICD-10-CM | POA: Diagnosis not present

## 2022-11-27 DIAGNOSIS — L299 Pruritus, unspecified: Secondary | ICD-10-CM | POA: Diagnosis not present

## 2022-11-27 DIAGNOSIS — I5022 Chronic systolic (congestive) heart failure: Secondary | ICD-10-CM | POA: Diagnosis not present

## 2022-11-27 DIAGNOSIS — B372 Candidiasis of skin and nail: Secondary | ICD-10-CM | POA: Diagnosis not present

## 2022-11-27 DIAGNOSIS — B356 Tinea cruris: Secondary | ICD-10-CM | POA: Diagnosis not present

## 2022-11-27 DIAGNOSIS — I5030 Unspecified diastolic (congestive) heart failure: Secondary | ICD-10-CM | POA: Diagnosis not present

## 2022-11-27 DIAGNOSIS — R0600 Dyspnea, unspecified: Secondary | ICD-10-CM | POA: Diagnosis not present

## 2022-11-27 DIAGNOSIS — I110 Hypertensive heart disease with heart failure: Secondary | ICD-10-CM | POA: Diagnosis not present

## 2022-11-27 DIAGNOSIS — N189 Chronic kidney disease, unspecified: Secondary | ICD-10-CM | POA: Diagnosis not present

## 2022-12-03 ENCOUNTER — Encounter: Payer: Self-pay | Admitting: Family

## 2022-12-06 DIAGNOSIS — I5030 Unspecified diastolic (congestive) heart failure: Secondary | ICD-10-CM | POA: Diagnosis not present

## 2022-12-06 DIAGNOSIS — R54 Age-related physical debility: Secondary | ICD-10-CM | POA: Diagnosis not present

## 2022-12-06 DIAGNOSIS — K5900 Constipation, unspecified: Secondary | ICD-10-CM | POA: Diagnosis not present

## 2022-12-10 DIAGNOSIS — I2510 Atherosclerotic heart disease of native coronary artery without angina pectoris: Secondary | ICD-10-CM | POA: Diagnosis not present

## 2022-12-10 DIAGNOSIS — I5022 Chronic systolic (congestive) heart failure: Secondary | ICD-10-CM | POA: Diagnosis not present

## 2022-12-10 DIAGNOSIS — B356 Tinea cruris: Secondary | ICD-10-CM | POA: Diagnosis not present

## 2022-12-10 DIAGNOSIS — R0600 Dyspnea, unspecified: Secondary | ICD-10-CM | POA: Diagnosis not present

## 2022-12-10 DIAGNOSIS — B372 Candidiasis of skin and nail: Secondary | ICD-10-CM | POA: Diagnosis not present

## 2022-12-10 DIAGNOSIS — I110 Hypertensive heart disease with heart failure: Secondary | ICD-10-CM | POA: Diagnosis not present

## 2022-12-10 DIAGNOSIS — I5030 Unspecified diastolic (congestive) heart failure: Secondary | ICD-10-CM | POA: Diagnosis not present

## 2022-12-10 DIAGNOSIS — L299 Pruritus, unspecified: Secondary | ICD-10-CM | POA: Diagnosis not present

## 2022-12-10 DIAGNOSIS — N189 Chronic kidney disease, unspecified: Secondary | ICD-10-CM | POA: Diagnosis not present

## 2022-12-11 ENCOUNTER — Telehealth: Payer: Self-pay | Admitting: Family

## 2022-12-11 NOTE — Telephone Encounter (Signed)
BMP results from home health dated 12/10/22:   BUN 50 Creatinine 1.52 GFR 33 Potassium 4.4 Sodium 142

## 2022-12-18 DIAGNOSIS — L299 Pruritus, unspecified: Secondary | ICD-10-CM | POA: Diagnosis not present

## 2022-12-18 DIAGNOSIS — B372 Candidiasis of skin and nail: Secondary | ICD-10-CM | POA: Diagnosis not present

## 2022-12-18 DIAGNOSIS — I5030 Unspecified diastolic (congestive) heart failure: Secondary | ICD-10-CM | POA: Diagnosis not present

## 2022-12-18 DIAGNOSIS — I110 Hypertensive heart disease with heart failure: Secondary | ICD-10-CM | POA: Diagnosis not present

## 2022-12-18 DIAGNOSIS — I2510 Atherosclerotic heart disease of native coronary artery without angina pectoris: Secondary | ICD-10-CM | POA: Diagnosis not present

## 2022-12-18 DIAGNOSIS — B356 Tinea cruris: Secondary | ICD-10-CM | POA: Diagnosis not present

## 2022-12-20 ENCOUNTER — Encounter: Payer: Self-pay | Admitting: Family

## 2022-12-21 DIAGNOSIS — H409 Unspecified glaucoma: Secondary | ICD-10-CM | POA: Diagnosis not present

## 2022-12-21 DIAGNOSIS — B356 Tinea cruris: Secondary | ICD-10-CM | POA: Diagnosis not present

## 2022-12-21 DIAGNOSIS — Z7901 Long term (current) use of anticoagulants: Secondary | ICD-10-CM | POA: Diagnosis not present

## 2022-12-21 DIAGNOSIS — H54414A Blindness right eye category 4, normal vision left eye: Secondary | ICD-10-CM | POA: Diagnosis not present

## 2022-12-21 DIAGNOSIS — I110 Hypertensive heart disease with heart failure: Secondary | ICD-10-CM | POA: Diagnosis not present

## 2022-12-21 DIAGNOSIS — Z9181 History of falling: Secondary | ICD-10-CM | POA: Diagnosis not present

## 2022-12-21 DIAGNOSIS — B372 Candidiasis of skin and nail: Secondary | ICD-10-CM | POA: Diagnosis not present

## 2022-12-21 DIAGNOSIS — I5030 Unspecified diastolic (congestive) heart failure: Secondary | ICD-10-CM | POA: Diagnosis not present

## 2022-12-21 DIAGNOSIS — E1151 Type 2 diabetes mellitus with diabetic peripheral angiopathy without gangrene: Secondary | ICD-10-CM | POA: Diagnosis not present

## 2022-12-21 DIAGNOSIS — M8580 Other specified disorders of bone density and structure, unspecified site: Secondary | ICD-10-CM | POA: Diagnosis not present

## 2022-12-21 DIAGNOSIS — E785 Hyperlipidemia, unspecified: Secondary | ICD-10-CM | POA: Diagnosis not present

## 2022-12-21 DIAGNOSIS — Z6837 Body mass index (BMI) 37.0-37.9, adult: Secondary | ICD-10-CM | POA: Diagnosis not present

## 2022-12-21 DIAGNOSIS — R001 Bradycardia, unspecified: Secondary | ICD-10-CM | POA: Diagnosis not present

## 2022-12-21 DIAGNOSIS — I4819 Other persistent atrial fibrillation: Secondary | ICD-10-CM | POA: Diagnosis not present

## 2022-12-21 DIAGNOSIS — J449 Chronic obstructive pulmonary disease, unspecified: Secondary | ICD-10-CM | POA: Diagnosis not present

## 2022-12-21 DIAGNOSIS — I2510 Atherosclerotic heart disease of native coronary artery without angina pectoris: Secondary | ICD-10-CM | POA: Diagnosis not present

## 2022-12-21 DIAGNOSIS — L299 Pruritus, unspecified: Secondary | ICD-10-CM | POA: Diagnosis not present

## 2022-12-24 DIAGNOSIS — E1151 Type 2 diabetes mellitus with diabetic peripheral angiopathy without gangrene: Secondary | ICD-10-CM | POA: Diagnosis not present

## 2022-12-24 DIAGNOSIS — I4819 Other persistent atrial fibrillation: Secondary | ICD-10-CM | POA: Diagnosis not present

## 2022-12-24 DIAGNOSIS — I5030 Unspecified diastolic (congestive) heart failure: Secondary | ICD-10-CM | POA: Diagnosis not present

## 2022-12-24 DIAGNOSIS — L299 Pruritus, unspecified: Secondary | ICD-10-CM | POA: Diagnosis not present

## 2022-12-24 DIAGNOSIS — J449 Chronic obstructive pulmonary disease, unspecified: Secondary | ICD-10-CM | POA: Diagnosis not present

## 2022-12-24 DIAGNOSIS — I110 Hypertensive heart disease with heart failure: Secondary | ICD-10-CM | POA: Diagnosis not present

## 2022-12-25 DIAGNOSIS — B372 Candidiasis of skin and nail: Secondary | ICD-10-CM | POA: Diagnosis not present

## 2022-12-31 DIAGNOSIS — H409 Unspecified glaucoma: Secondary | ICD-10-CM | POA: Diagnosis not present

## 2022-12-31 DIAGNOSIS — F331 Major depressive disorder, recurrent, moderate: Secondary | ICD-10-CM | POA: Diagnosis not present

## 2022-12-31 DIAGNOSIS — D649 Anemia, unspecified: Secondary | ICD-10-CM | POA: Diagnosis not present

## 2022-12-31 DIAGNOSIS — I5030 Unspecified diastolic (congestive) heart failure: Secondary | ICD-10-CM | POA: Diagnosis not present

## 2022-12-31 DIAGNOSIS — R54 Age-related physical debility: Secondary | ICD-10-CM | POA: Diagnosis not present

## 2022-12-31 DIAGNOSIS — E1142 Type 2 diabetes mellitus with diabetic polyneuropathy: Secondary | ICD-10-CM | POA: Diagnosis not present

## 2022-12-31 DIAGNOSIS — E785 Hyperlipidemia, unspecified: Secondary | ICD-10-CM | POA: Diagnosis not present

## 2022-12-31 DIAGNOSIS — J449 Chronic obstructive pulmonary disease, unspecified: Secondary | ICD-10-CM | POA: Diagnosis not present

## 2023-01-01 DIAGNOSIS — L299 Pruritus, unspecified: Secondary | ICD-10-CM | POA: Diagnosis not present

## 2023-01-01 DIAGNOSIS — I110 Hypertensive heart disease with heart failure: Secondary | ICD-10-CM | POA: Diagnosis not present

## 2023-01-01 DIAGNOSIS — I4819 Other persistent atrial fibrillation: Secondary | ICD-10-CM | POA: Diagnosis not present

## 2023-01-01 DIAGNOSIS — E1151 Type 2 diabetes mellitus with diabetic peripheral angiopathy without gangrene: Secondary | ICD-10-CM | POA: Diagnosis not present

## 2023-01-01 DIAGNOSIS — I5030 Unspecified diastolic (congestive) heart failure: Secondary | ICD-10-CM | POA: Diagnosis not present

## 2023-01-01 DIAGNOSIS — J449 Chronic obstructive pulmonary disease, unspecified: Secondary | ICD-10-CM | POA: Diagnosis not present

## 2023-01-02 DIAGNOSIS — R54 Age-related physical debility: Secondary | ICD-10-CM | POA: Diagnosis not present

## 2023-01-02 DIAGNOSIS — J449 Chronic obstructive pulmonary disease, unspecified: Secondary | ICD-10-CM | POA: Diagnosis not present

## 2023-01-02 DIAGNOSIS — I5030 Unspecified diastolic (congestive) heart failure: Secondary | ICD-10-CM | POA: Diagnosis not present

## 2023-01-02 DIAGNOSIS — K5900 Constipation, unspecified: Secondary | ICD-10-CM | POA: Diagnosis not present

## 2023-01-06 DIAGNOSIS — I4819 Other persistent atrial fibrillation: Secondary | ICD-10-CM | POA: Diagnosis not present

## 2023-01-06 DIAGNOSIS — J449 Chronic obstructive pulmonary disease, unspecified: Secondary | ICD-10-CM | POA: Diagnosis not present

## 2023-01-06 DIAGNOSIS — E1151 Type 2 diabetes mellitus with diabetic peripheral angiopathy without gangrene: Secondary | ICD-10-CM | POA: Diagnosis not present

## 2023-01-06 DIAGNOSIS — L299 Pruritus, unspecified: Secondary | ICD-10-CM | POA: Diagnosis not present

## 2023-01-06 DIAGNOSIS — I5030 Unspecified diastolic (congestive) heart failure: Secondary | ICD-10-CM | POA: Diagnosis not present

## 2023-01-06 DIAGNOSIS — I110 Hypertensive heart disease with heart failure: Secondary | ICD-10-CM | POA: Diagnosis not present

## 2023-01-09 ENCOUNTER — Other Ambulatory Visit: Payer: Self-pay | Admitting: Family

## 2023-01-13 ENCOUNTER — Ambulatory Visit (INDEPENDENT_AMBULATORY_CARE_PROVIDER_SITE_OTHER): Payer: Medicare Other | Admitting: Podiatry

## 2023-01-13 ENCOUNTER — Encounter: Payer: Self-pay | Admitting: Podiatry

## 2023-01-13 DIAGNOSIS — E119 Type 2 diabetes mellitus without complications: Secondary | ICD-10-CM

## 2023-01-13 DIAGNOSIS — B351 Tinea unguium: Secondary | ICD-10-CM | POA: Diagnosis not present

## 2023-01-13 DIAGNOSIS — D689 Coagulation defect, unspecified: Secondary | ICD-10-CM

## 2023-01-13 DIAGNOSIS — M79675 Pain in left toe(s): Secondary | ICD-10-CM | POA: Diagnosis not present

## 2023-01-13 DIAGNOSIS — M79674 Pain in right toe(s): Secondary | ICD-10-CM | POA: Diagnosis not present

## 2023-01-13 NOTE — Progress Notes (Signed)
  Subjective:  Patient ID: Jenny [name], female    DOB: 01-08-1933,   MRN: [id number]  Chief Complaint  Patient presents with   Nail Problem     Routine foot care , patient states she has had a fungus for over 30 years.    89 y.o. female presents for concern of thickened elongated and painful nails that are difficult to trim. Requesting to have them trimmed today.  She has been dealing with toenail fungus for 30+ years. Does relates some pain in her feet after walking for periods.  Relates burning and tingling in their feet. Patient is diabetic and last A1c was  Lab Results  Component Value Date   HGBA1C 6.4 (H) 11/05/2021   .   PCP:  [facility]    . Denies any other pedal complaints. Denies n/v/f/c.   Past Medical History:  Diagnosis Date   ACUT DUOD ULCER W/HEMORR&PERF W/O MENTION OBST 10/05/2009   NSAID induced   ALLERGIC RHINITIS CAUSE UNSPECIFIED    ANEMIA-NOS    CAD (coronary artery disease) 06/08/2009   DEs RCA with 70% LAD and EF 60%   CHF (congestive heart failure) ([place])    COPD    mild obst on PFTs 03/2010   Diabetes mellitus 06/2010 dx   Mild, diet controlled   GLAUCOMA    HYPERLIPIDEMIA    HYPERTENSION, BENIGN    MYOCARDIAL INFARCTION 06/08/2009   des to rca   Persistent atrial fibrillation ([place])    Dx 08/2021   TOBACCO ABUSE     Objective:  Physical Exam: Vascular: DP/PT pulses 0/4 bilateral. CFT <3 seconds. Absent hair growth on digits. Edema noted to bilateral lower extremities. Xerosis noted bilaterally. Erythema noted to distal extremities as well.  Skin. No lacerations or abrasions bilateral feet. Nails 1-5 bilateral  are thickened discolored and elongated with subungual debris.  Musculoskeletal: MMT 5/5 bilateral lower extremities in DF, PF, Inversion and Eversion. Deceased ROM in DF of ankle joint.  Neurological: Sensation intact to light touch. Protective sensation diminished bilateral.    Assessment:   1. Type 2 diabetes, diet  controlled ([place])   2. Pain due to onychomycosis of toenails of both feet   3. Coagulation defect ([place])      Plan:  Patient was evaluated and treated and all questions answered. -Discussed and educated patient on diabetic foot care, especially with  regards to the vascular, neurological and musculoskeletal systems.  -Stressed the importance of good glycemic control and the detriment of not  controlling glucose levels in relation to the foot. -Discussed supportive shoes at all times and checking feet regularly.  -Mechanically debrided all nails 1-5 bilateral using sterile nail nipper and filed with dremel without incident  -ABIS ordered to evaluate blood flow -Answered all patient questions -Patient to return  in 3 months for at risk foot care -Patient advised to call the office if any problems or questions arise in the meantime.   Lorenda Peck, DPM

## 2023-01-14 ENCOUNTER — Ambulatory Visit (HOSPITAL_COMMUNITY): Payer: Medicare Other

## 2023-01-14 DIAGNOSIS — I110 Hypertensive heart disease with heart failure: Secondary | ICD-10-CM | POA: Diagnosis not present

## 2023-01-14 DIAGNOSIS — I5030 Unspecified diastolic (congestive) heart failure: Secondary | ICD-10-CM | POA: Diagnosis not present

## 2023-01-14 DIAGNOSIS — E1151 Type 2 diabetes mellitus with diabetic peripheral angiopathy without gangrene: Secondary | ICD-10-CM | POA: Diagnosis not present

## 2023-01-14 DIAGNOSIS — L299 Pruritus, unspecified: Secondary | ICD-10-CM | POA: Diagnosis not present

## 2023-01-14 DIAGNOSIS — I4819 Other persistent atrial fibrillation: Secondary | ICD-10-CM | POA: Diagnosis not present

## 2023-01-14 DIAGNOSIS — J449 Chronic obstructive pulmonary disease, unspecified: Secondary | ICD-10-CM | POA: Diagnosis not present

## 2023-01-16 NOTE — Progress Notes (Signed)
Chief Complaint  Patient presents with   Follow-up    CAD   History of Present Illness: 89 yo female with history of CAD, HTN, HLD, COPD, tobacco abuse, carotid artery disease, Mobitz 1 heart block, chronic diastolic CHF, atrial fibrillation and DM here today cardiac follow up. In July of 2010 she had an inferior MI treated with a drug eluting stent in the right coronary artery. She had residual moderate LAD stenosis and an ejection fraction of 60%. She has chronic shortness of breath and chronic obstructive pulmonary disease by pulmonary function testing. She has continued to smoke despite advice against this.  Stress myoview 12/20/13 with no ischemia, normal LV function. Mild bilateral carotid artery disease by dopplers in January 2020. She was hospitalized in September 2022 with COPD exacerbation  and acute on chronic diastolic CHF. Echo September 2022 with LVEF=60-65%.  She was found to be in atrial fibrillation during that admission and was started on Eliquis. She was diuresed with IV Lasix.  She was found to be bradycardic and toprol was stopped. She has since been followed in our Advanced Heart Failure office at [facility]. She had been on ASA and Plavix but both have been stopped. Cardiac monitor October 2022 with 100% atrial fibrillation. She has been seen in the atrial fib clinic. She was admitted to [facility] 11/03/21 with a CHF and COPD exacerbation. She was cardioverted to sinus rhythm in February 2023 and immediately noted to have transient 2:1 AV block. She was seen in our office 03/13/22 by Dr. Ali Lowe with dyspnea and fatigue and reported heart rate in the 40s at home. EKG with Mobitz 1 AV block. She was referred to see Dr. Caryl Comes in the EP clinic. He and I agreed that an ischemic evaluation should be pursued before permanent pacing. I saw her in May 2023 and she refused to consider a cardiac cath at that time. I saw her back in August 2023 and she refused cardiac cath again. She reported ongoing dyspnea  and fatigue but no dizziness. She has been followed closely over the past three months by phone calls from the [facility] Failure clinic.   She is here today for follow up. The patient denies any chest pain, dyspnea, palpitations, occasional lower extremity edema, orthopnea, PND, dizziness, near syncope or syncope. She stopped the [place] in November.    Primary Care Physician: [facility]  Past Medical History:  Diagnosis Date   ACUT DUOD ULCER W/HEMORR&PERF W/O MENTION OBST 10/05/2009   NSAID induced   ALLERGIC RHINITIS CAUSE UNSPECIFIED    ANEMIA-NOS    CAD (coronary artery disease) 06/08/2009   DEs RCA with 70% LAD and EF 60%   CHF (congestive heart failure) (HCC)    COPD    mild obst on PFTs 03/2010   Diabetes mellitus 06/2010 dx   Mild, diet controlled   GLAUCOMA    HYPERLIPIDEMIA    HYPERTENSION, BENIGN    MYOCARDIAL INFARCTION 06/08/2009   des to rca   Persistent atrial fibrillation ([place])    Dx 08/2021   TOBACCO ABUSE     Past Surgical History:  Procedure Laterality Date   CARDIOVERSION N/A 01/30/2022   Procedure: CARDIOVERSION;  Surgeon: Acie Fredrickson Wonda Cheng, MD;  Location: [facility] ENDOSCOPY;  Service: Cardiovascular;  Laterality: N/A;   [place]   Right knee surgery      Current Outpatient Medications  Medication Sig Dispense Refill   acetaminophen (TYLENOL) 500 MG tablet Take 500 mg by mouth  every 6 (six) hours as needed. Takes two tablets in the morning and two tablets at night     albuterol (VENTOLIN HFA) 108 (90 Base) MCG/ACT inhaler INHALE ONE PUFF EVERY 6 HOURS AS NEEDED FOR SHORTNESS OF BREATH 18 g 2   apixaban (ELIQUIS) 5 MG TABS tablet Take 1 tablet (5 mg total) by mouth 2 (two) times daily. 60 tablet 11   dorzolamide (TRUSOPT) 2 % ophthalmic solution Place 1 drop into both eyes 2 (two) times daily.     famotidine (PEPCID) 20 MG tablet Take 20 mg by mouth 2 (two) times daily as needed for heartburn.      Fluticasone-Umeclidin-Vilant (TRELEGY ELLIPTA) 100-62.5-25 MCG/INH AEPB Inhale 1 puff into the lungs daily. 1 each 1   gabapentin (NEURONTIN) 100 MG capsule Take 100-200 mg by mouth See admin instructions. Take 1 capsule (161m) by mouth every morning and take 2 capsules (2031m by mouth at bedtime     hydrOXYzine (ATARAX) 25 MG tablet Take 25 mg by mouth every 8 (eight) hours as needed for itching.     ipratropium-albuterol (DUONEB) 0.5-2.5 (3) MG/3ML SOLN Take 3 mLs by nebulization every 6 (six) hours as needed. 360 mL 1   latanoprost (XALATAN) 0.005 % ophthalmic solution Place 1 drop into both eyes in the morning.     magnesium hydroxide (MILK OF MAGNESIA) 400 MG/5ML suspension Take 30 mLs by mouth as needed for mild constipation.     NITROSTAT 0.4 MG SL tablet DISSOLVE ONE TABLET UNDER TONGUE AS NEEDED FOR CHEST PAIN - MAY REPEAT TWICE-IF NO RELIEF GO TO NEAREST HOSPITAL ER 25 tablet 0   nystatin (MYCOSTATIN/NYSTOP) powder Apply 1 application topically daily.     rosuvastatin (CRESTOR) 20 MG tablet Take 20 mg by mouth daily.     torsemide (DEMADEX) 20 MG tablet TAKE TWO TABLETS EVERY DAY AND ADDITIONAL 20MG IF NEEDED FOR WEIGHT GAIN SHORTNESS OF BREATH OR SWELLING (DC FUROSEMIDE) 80 tablet 5   No current facility-administered medications for this visit.    Allergies  Allergen Reactions   Aspirin Other (See Comments)    Can take 81 mg not 325 mg   Atorvastatin Itching    Itching and myaliga   Cyclobenzaprine     Other reaction(s): muscle relaxers-ulcer hemorrhage (hospitalized)   Lactose Intolerance (Gi) Other (See Comments)    GI upset   Meperidine Hcl Other (See Comments)    Not known   Pravastatin Rash    rash   Propoxyphene     Other reaction(s): pain meds, hallucination, vomiting   Wound Dressing Adhesive     Other reaction(s): red, stings    Social History   Socioeconomic History   Marital status: Widowed    Spouse name: Not on file   Number of children: Not on file    Years of education: Not on file   Highest education level: Not on file  Occupational History   Not on file  Tobacco Use   Smoking status: Former    Packs/day: 0.50    Years: 60.00    Total pack years: 30.00    Types: Cigarettes    Quit date: 11/12/2021    Years since quitting: 1.1   Smokeless tobacco: Never   Tobacco comments:    [place]  Vaping Use   Vaping Use: Never used  Substance and Sexual Activity   Alcohol use: Yes    Alcohol/week: 1.0 - 2.0 standard drink of alcohol  Types: 1 - 2 Glasses of wine per week    Comment: once or twice a year glass of wine 12/25/21   Drug use: No   Sexual activity: Not on file  Other Topics Concern   Not on file  Social History Narrative   Lives in [place] with her SO - charles hook   Retired -Catering manager   Enjoys travel - live Photographer   Social Determinants of Radio broadcast assistant Strain: Not on Art therapist Insecurity: Not on file  Transportation Needs: Not on file  Physical Activity: Not on file  Stress: Not on file  Social Connections: Not on file  Intimate Partner Violence: Not on file    Family History  Problem Relation Age of Onset   Hypertension Mother    Ulcers Mother    Stomach cancer Father    Stroke Maternal Grandmother    Heart attack Neg Hx     Review of Systems:  As stated in the HPI and otherwise negative.   BP 136/70   Pulse 80   Ht 5' 2"$  (1.575 m)   Wt 92.7 kg   SpO2 98%   BMI 37.39 kg/m   Physical Examination: General: Well developed, well nourished, NAD  HEENT: OP clear, mucus membranes moist  SKIN: warm, dry. No rashes. Neuro: No focal deficits  Musculoskeletal: Muscle strength 5/5 all ext  Psychiatric: Mood and affect normal  Neck: No JVD, no carotid bruits, no thyromegaly, no lymphadenopathy.  Lungs:Clear bilaterally, no wheezes, rhonci, crackles Cardiovascular: Regular rate and rhythm. No murmurs, gallops or  rubs. Abdomen:Soft. Bowel sounds present. Non-tender.  Extremities: No lower extremity edema.   EKG:  EKG is not ordered today The ekg ordered today demonstrates    Echo 08/22/21: 1. Left ventricular ejection fraction, by estimation, is 60 to 65%. The  left ventricle has normal function. The left ventricle has no regional  wall motion abnormalities. There is mild left ventricular hypertrophy.  Left ventricular diastolic parameters  are indeterminate.   2. Right ventricular systolic function is normal. The right ventricular  size is normal. There is severely elevated pulmonary artery systolic  pressure. The estimated right ventricular systolic pressure is 0000000 mmHg.   3. Left atrial size was mildly dilated.   4. Right atrial size was moderately dilated.   5. The mitral valve is normal in structure. Mild mitral valve  regurgitation. Mild mitral stenosis.   6. The inferior vena cava is dilated in size with <50% respiratory  variability, suggesting right atrial pressure of 15 mmHg.   Recent Labs: 01/30/2022: BUN 41; Creatinine, Ser 1.24; Hemoglobin 14.2; Platelets 247; Potassium 4.0; Sodium 135    Wt Readings from Last 3 Encounters:  01/17/23 92.7 kg  11/07/22 98.9 kg  08/02/22 95.1 kg    Assessment and Plan:   1. CAD without angina: She has been known to have a moderate LAD stenosis since her cath in 2010 and has continued smoking since then. She has no chest pain. She has no change in her baseline dyspnea. Will not proceed with cardiac cath as she has refused this. Continue statin. Her anti-platelet therapy was stopped when she was started on Eliquis. She has not been on a beta blocker due to bradycardia.      2. HYPERTENSION: BP is controlled at home. No changes today  3. HYPERLIPIDEMIA: Lipids followed in primary care. Continue statin. Check lipids and LFTs now.   4. Carotid artery disease: Mild bilateral carotid disease by  dopplers January 2020.  Will not repeat given advanced  age.   5. Tobacco abuse: She quit smoking in 2023.   6. Atrial fibrillation/History of Second degree AV block, type 1/First degree AV block: She has been seen by Dr. Caryl Comes and pacemaker placement on hold pending ischemic evaluation. She has no dizziness or near syncope. She does not wish to consider a cath so will not move forward with a pacemaker. Continue Eliquis.   7. Chronic diastolic CHF: Weight is stable. Continue torsemide 40 mg daily. .   Labs/ tests ordered today include:   Orders Placed This Encounter  Procedures   Lipid panel   Hepatic function panel   Disposition:   F/U with me in 12  months  Signed, Lauree Chandler, MD 01/17/2023 9:11 AM    [facility] [address], [place], [place]  [address] Phone: [phone]; Fax: [phone]

## 2023-01-17 ENCOUNTER — Ambulatory Visit: Payer: Medicare Other | Attending: Cardiovascular Disease | Admitting: Cardiovascular Disease

## 2023-01-17 ENCOUNTER — Encounter: Payer: Self-pay | Admitting: Cardiovascular Disease

## 2023-01-17 VITALS — BP 136/70 | HR 80 | Ht 62.0 in | Wt 204.4 lb

## 2023-01-17 DIAGNOSIS — I480 Paroxysmal atrial fibrillation: Secondary | ICD-10-CM

## 2023-01-17 DIAGNOSIS — E7800 Pure hypercholesterolemia, unspecified: Secondary | ICD-10-CM

## 2023-01-17 DIAGNOSIS — I10 Essential (primary) hypertension: Secondary | ICD-10-CM | POA: Insufficient documentation

## 2023-01-17 DIAGNOSIS — I5032 Chronic diastolic (congestive) heart failure: Secondary | ICD-10-CM | POA: Diagnosis not present

## 2023-01-17 DIAGNOSIS — I6523 Occlusion and stenosis of bilateral carotid arteries: Secondary | ICD-10-CM

## 2023-01-17 DIAGNOSIS — I2510 Atherosclerotic heart disease of native coronary artery without angina pectoris: Secondary | ICD-10-CM | POA: Diagnosis not present

## 2023-01-17 NOTE — Patient Instructions (Addendum)
Medication Instructions:  No changes *If you need a refill on your cardiac medications before your next appointment, please call your pharmacy*   Lab Work: Today: lipids/liver function  If you have labs (blood work) drawn today and your tests are completely normal, you will receive your results only by: [place] (if you have MyChart) OR A paper copy in the mail If you have any lab test that is abnormal or we need to change your treatment, we will call you to review the results.   Testing/Procedures: none   Follow-Up: At [facility], you and your health needs are our priority.  As part of our continuing mission to provide you with exceptional heart care, we have created designated Provider Care Teams.  These Care Teams include your primary Cardiologist (physician) and Advanced Practice Providers (APPs -  Physician Assistants and Nurse Practitioners) who all work together to provide you with the care you need, when you need it.   Your next appointment:   12 month(s)  Provider:   Lauree Chandler, MD

## 2023-01-18 LAB — LIPID PANEL
Chol/HDL Ratio: 2.9 ratio (ref 0.0–4.4)
Cholesterol, Total: 150 mg/dL (ref 100–199)
HDL: 51 mg/dL (ref >39)
LDL Chol Calc (NIH): 52 mg/dL (ref 0–99)
Triglycerides: 311 mg/dL — ABNORMAL HIGH (ref 0–149)
VLDL Cholesterol Cal: 47 mg/dL — ABNORMAL HIGH (ref 5–40)

## 2023-01-18 LAB — HEPATIC FUNCTION PANEL
ALT: 19 IU/L (ref 0–32)
AST: 21 IU/L (ref 0–40)
Albumin: 4 g/dL (ref 3.7–4.7)
Alkaline Phosphatase: 114 IU/L (ref 44–121)
Bilirubin Total: <0.2 mg/dL (ref 0.0–1.2)
Bilirubin, Direct: <0.1 mg/dL (ref 0.00–0.40)
Total Protein: 6.1 g/dL (ref 6.0–8.5)

## 2023-01-20 DIAGNOSIS — I110 Hypertensive heart disease with heart failure: Secondary | ICD-10-CM | POA: Diagnosis not present

## 2023-01-20 DIAGNOSIS — E785 Hyperlipidemia, unspecified: Secondary | ICD-10-CM | POA: Diagnosis not present

## 2023-01-20 DIAGNOSIS — B372 Candidiasis of skin and nail: Secondary | ICD-10-CM | POA: Diagnosis not present

## 2023-01-20 DIAGNOSIS — Z9181 History of falling: Secondary | ICD-10-CM | POA: Diagnosis not present

## 2023-01-20 DIAGNOSIS — I5030 Unspecified diastolic (congestive) heart failure: Secondary | ICD-10-CM | POA: Diagnosis not present

## 2023-01-20 DIAGNOSIS — J449 Chronic obstructive pulmonary disease, unspecified: Secondary | ICD-10-CM | POA: Diagnosis not present

## 2023-01-20 DIAGNOSIS — I4819 Other persistent atrial fibrillation: Secondary | ICD-10-CM | POA: Diagnosis not present

## 2023-01-20 DIAGNOSIS — B356 Tinea cruris: Secondary | ICD-10-CM | POA: Diagnosis not present

## 2023-01-20 DIAGNOSIS — Z6837 Body mass index (BMI) 37.0-37.9, adult: Secondary | ICD-10-CM | POA: Diagnosis not present

## 2023-01-20 DIAGNOSIS — L299 Pruritus, unspecified: Secondary | ICD-10-CM | POA: Diagnosis not present

## 2023-01-20 DIAGNOSIS — H54414A Blindness right eye category 4, normal vision left eye: Secondary | ICD-10-CM | POA: Diagnosis not present

## 2023-01-20 DIAGNOSIS — R001 Bradycardia, unspecified: Secondary | ICD-10-CM | POA: Diagnosis not present

## 2023-01-20 DIAGNOSIS — H409 Unspecified glaucoma: Secondary | ICD-10-CM | POA: Diagnosis not present

## 2023-01-20 DIAGNOSIS — M8580 Other specified disorders of bone density and structure, unspecified site: Secondary | ICD-10-CM | POA: Diagnosis not present

## 2023-01-20 DIAGNOSIS — Z7901 Long term (current) use of anticoagulants: Secondary | ICD-10-CM | POA: Diagnosis not present

## 2023-01-20 DIAGNOSIS — E1151 Type 2 diabetes mellitus with diabetic peripheral angiopathy without gangrene: Secondary | ICD-10-CM | POA: Diagnosis not present

## 2023-01-20 DIAGNOSIS — I2510 Atherosclerotic heart disease of native coronary artery without angina pectoris: Secondary | ICD-10-CM | POA: Diagnosis not present

## 2023-01-21 ENCOUNTER — Other Ambulatory Visit (HOSPITAL_COMMUNITY): Payer: Self-pay | Admitting: Podiatry

## 2023-01-21 DIAGNOSIS — M79671 Pain in right foot: Secondary | ICD-10-CM

## 2023-01-23 DIAGNOSIS — R5383 Other fatigue: Secondary | ICD-10-CM | POA: Diagnosis not present

## 2023-01-23 DIAGNOSIS — I5030 Unspecified diastolic (congestive) heart failure: Secondary | ICD-10-CM | POA: Diagnosis not present

## 2023-01-23 DIAGNOSIS — E1151 Type 2 diabetes mellitus with diabetic peripheral angiopathy without gangrene: Secondary | ICD-10-CM | POA: Diagnosis not present

## 2023-01-23 DIAGNOSIS — I4819 Other persistent atrial fibrillation: Secondary | ICD-10-CM | POA: Diagnosis not present

## 2023-01-23 DIAGNOSIS — J449 Chronic obstructive pulmonary disease, unspecified: Secondary | ICD-10-CM | POA: Diagnosis not present

## 2023-01-23 DIAGNOSIS — L299 Pruritus, unspecified: Secondary | ICD-10-CM | POA: Diagnosis not present

## 2023-01-23 DIAGNOSIS — R0600 Dyspnea, unspecified: Secondary | ICD-10-CM | POA: Diagnosis not present

## 2023-01-23 DIAGNOSIS — I110 Hypertensive heart disease with heart failure: Secondary | ICD-10-CM | POA: Diagnosis not present

## 2023-01-24 ENCOUNTER — Ambulatory Visit (HOSPITAL_COMMUNITY)
Admission: RE | Admit: 2023-01-24 | Discharge: 2023-01-24 | Disposition: A | Discharge status: Home / Self Care | Payer: Medicare Other | Source: Ambulatory Visit | Attending: Internal Medicine | Admitting: Internal Medicine

## 2023-01-24 DIAGNOSIS — M79671 Pain in right foot: Secondary | ICD-10-CM | POA: Insufficient documentation

## 2023-01-24 DIAGNOSIS — J449 Chronic obstructive pulmonary disease, unspecified: Secondary | ICD-10-CM | POA: Diagnosis not present

## 2023-01-24 DIAGNOSIS — M79604 Pain in right leg: Secondary | ICD-10-CM | POA: Diagnosis not present

## 2023-01-24 DIAGNOSIS — M79672 Pain in left foot: Secondary | ICD-10-CM | POA: Insufficient documentation

## 2023-01-24 DIAGNOSIS — D689 Coagulation defect, unspecified: Secondary | ICD-10-CM | POA: Insufficient documentation

## 2023-01-24 DIAGNOSIS — E119 Type 2 diabetes mellitus without complications: Secondary | ICD-10-CM | POA: Diagnosis not present

## 2023-01-24 DIAGNOSIS — M79605 Pain in left leg: Secondary | ICD-10-CM | POA: Insufficient documentation

## 2023-01-24 LAB — VAS US ABI WITH/WO TBI
Left ABI: 1.02
Right ABI: 1.08

## 2023-01-27 ENCOUNTER — Encounter: Payer: Self-pay | Admitting: Nurse Practitioner

## 2023-01-28 ENCOUNTER — Telehealth: Payer: Self-pay | Admitting: Family

## 2023-01-28 DIAGNOSIS — I4819 Other persistent atrial fibrillation: Secondary | ICD-10-CM | POA: Diagnosis not present

## 2023-01-28 DIAGNOSIS — E1151 Type 2 diabetes mellitus with diabetic peripheral angiopathy without gangrene: Secondary | ICD-10-CM | POA: Diagnosis not present

## 2023-01-28 DIAGNOSIS — J449 Chronic obstructive pulmonary disease, unspecified: Secondary | ICD-10-CM

## 2023-01-28 DIAGNOSIS — I110 Hypertensive heart disease with heart failure: Secondary | ICD-10-CM | POA: Diagnosis not present

## 2023-01-28 DIAGNOSIS — L299 Pruritus, unspecified: Secondary | ICD-10-CM | POA: Diagnosis not present

## 2023-01-28 DIAGNOSIS — I5030 Unspecified diastolic (congestive) heart failure: Secondary | ICD-10-CM | POA: Diagnosis not present

## 2023-01-28 MED ORDER — FUROSEMIDE 40 MG PO TABS: 40.0000 mg | ORAL_TABLET | Freq: Every day | ORAL | 5 refills | Status: DC

## 2023-01-28 NOTE — Telephone Encounter (Signed)
Received text messages about patient feeling more SOB recently even though her weight was down. O2 is 96% on room air, remains bradycardic with weight of 196.2 pounds.   Patient says that she is monitoring her sodium/ fluid intake but that she doesn't think the torsemide is working like it used to. She is also asking about pulmonology referral to [facility].   Will stop torsemide and begin furosemide 52m daily and [facility] per her request. Will check labs at clinic visit next week.

## 2023-01-29 DIAGNOSIS — S80812A Abrasion, left lower leg, initial encounter: Secondary | ICD-10-CM | POA: Diagnosis not present

## 2023-01-29 DIAGNOSIS — L03116 Cellulitis of left lower limb: Secondary | ICD-10-CM | POA: Diagnosis not present

## 2023-01-29 DIAGNOSIS — W5503XA Scratched by cat, initial encounter: Secondary | ICD-10-CM | POA: Diagnosis not present

## 2023-01-30 DIAGNOSIS — D649 Anemia, unspecified: Secondary | ICD-10-CM | POA: Diagnosis not present

## 2023-01-30 DIAGNOSIS — J449 Chronic obstructive pulmonary disease, unspecified: Secondary | ICD-10-CM | POA: Diagnosis not present

## 2023-01-30 DIAGNOSIS — F331 Major depressive disorder, recurrent, moderate: Secondary | ICD-10-CM | POA: Diagnosis not present

## 2023-01-30 DIAGNOSIS — E1142 Type 2 diabetes mellitus with diabetic polyneuropathy: Secondary | ICD-10-CM | POA: Diagnosis not present

## 2023-01-30 DIAGNOSIS — I5030 Unspecified diastolic (congestive) heart failure: Secondary | ICD-10-CM | POA: Diagnosis not present

## 2023-01-30 DIAGNOSIS — E785 Hyperlipidemia, unspecified: Secondary | ICD-10-CM | POA: Diagnosis not present

## 2023-01-30 DIAGNOSIS — R54 Age-related physical debility: Secondary | ICD-10-CM | POA: Diagnosis not present

## 2023-01-31 DIAGNOSIS — I5030 Unspecified diastolic (congestive) heart failure: Secondary | ICD-10-CM | POA: Diagnosis not present

## 2023-01-31 DIAGNOSIS — F449 Dissociative and conversion disorder, unspecified: Secondary | ICD-10-CM | POA: Diagnosis not present

## 2023-01-31 DIAGNOSIS — R54 Age-related physical debility: Secondary | ICD-10-CM | POA: Diagnosis not present

## 2023-01-31 DIAGNOSIS — K5900 Constipation, unspecified: Secondary | ICD-10-CM | POA: Diagnosis not present

## 2023-01-31 DIAGNOSIS — L03116 Cellulitis of left lower limb: Secondary | ICD-10-CM | POA: Diagnosis not present

## 2023-02-04 ENCOUNTER — Ambulatory Visit (HOSPITAL_BASED_OUTPATIENT_CLINIC_OR_DEPARTMENT_OTHER): Payer: Medicare Other | Admitting: Family

## 2023-02-04 ENCOUNTER — Encounter: Payer: Self-pay | Admitting: Family

## 2023-02-04 ENCOUNTER — Other Ambulatory Visit
Admission: RE | Admit: 2023-02-04 | Discharge: 2023-02-04 | Disposition: A | Discharge status: Home / Self Care | Payer: Medicare Other | Source: Ambulatory Visit | Attending: Family | Admitting: Family

## 2023-02-04 VITALS — BP 127/59 | HR 42 | Resp 18 | Ht 62.0 in | Wt 202.0 lb

## 2023-02-04 DIAGNOSIS — Z87891 Personal history of nicotine dependence: Secondary | ICD-10-CM | POA: Insufficient documentation

## 2023-02-04 DIAGNOSIS — Z8249 Family history of ischemic heart disease and other diseases of the circulatory system: Secondary | ICD-10-CM | POA: Insufficient documentation

## 2023-02-04 DIAGNOSIS — E785 Hyperlipidemia, unspecified: Secondary | ICD-10-CM | POA: Insufficient documentation

## 2023-02-04 DIAGNOSIS — I480 Paroxysmal atrial fibrillation: Secondary | ICD-10-CM | POA: Diagnosis not present

## 2023-02-04 DIAGNOSIS — I10 Essential (primary) hypertension: Secondary | ICD-10-CM

## 2023-02-04 DIAGNOSIS — I4819 Other persistent atrial fibrillation: Secondary | ICD-10-CM | POA: Insufficient documentation

## 2023-02-04 DIAGNOSIS — I6523 Occlusion and stenosis of bilateral carotid arteries: Secondary | ICD-10-CM

## 2023-02-04 DIAGNOSIS — I509 Heart failure, unspecified: Secondary | ICD-10-CM | POA: Diagnosis not present

## 2023-02-04 DIAGNOSIS — I110 Hypertensive heart disease with heart failure: Secondary | ICD-10-CM | POA: Insufficient documentation

## 2023-02-04 DIAGNOSIS — J449 Chronic obstructive pulmonary disease, unspecified: Secondary | ICD-10-CM

## 2023-02-04 DIAGNOSIS — Z79899 Other long term (current) drug therapy: Secondary | ICD-10-CM | POA: Insufficient documentation

## 2023-02-04 DIAGNOSIS — I2510 Atherosclerotic heart disease of native coronary artery without angina pectoris: Secondary | ICD-10-CM | POA: Insufficient documentation

## 2023-02-04 DIAGNOSIS — I5032 Chronic diastolic (congestive) heart failure: Secondary | ICD-10-CM | POA: Diagnosis not present

## 2023-02-04 LAB — BASIC METABOLIC PANEL
Anion gap: 13 (ref 5–15)
BUN: 48 mg/dL — ABNORMAL HIGH (ref 8–23)
CO2: 28 mmol/L (ref 22–32)
Calcium: 8.1 mg/dL — ABNORMAL LOW (ref 8.9–10.3)
Chloride: 98 mmol/L (ref 98–111)
Creatinine, Ser: 1.48 mg/dL — ABNORMAL HIGH (ref 0.44–1.00)
GFR, Estimated: 34 mL/min — ABNORMAL LOW (ref >60)
Glucose, Bld: 110 mg/dL — ABNORMAL HIGH (ref 70–99)
Potassium: 3.3 mmol/L — ABNORMAL LOW (ref 3.5–5.1)
Sodium: 139 mmol/L (ref 135–145)

## 2023-02-04 MED ORDER — POTASSIUM CHLORIDE CRYS ER 20 MEQ PO TBCR: 20.0000 meq | EXTENDED_RELEASE_TABLET | Freq: Two times a day (BID) | ORAL | 3 refills | Status: DC

## 2023-02-04 MED ORDER — APIXABAN 5 MG PO TABS: 5.0000 mg | ORAL_TABLET | Freq: Two times a day (BID) | ORAL | 3 refills | Status: DC

## 2023-02-04 MED ORDER — TORSEMIDE 20 MG PO TABS: 40.0000 mg | ORAL_TABLET | Freq: Two times a day (BID) | ORAL | 5 refills | Status: DC

## 2023-02-04 NOTE — Patient Instructions (Signed)
Increase your torsemide to '40mg'$  twice daily

## 2023-02-04 NOTE — Progress Notes (Addendum)
REDS VEST READING= 30   CHEST RULER=35  VEST FITTING TASKS: POSTURE=  HEIGHT MARKER=B CENTER STRIP=ALIGNED  COMMENTS:

## 2023-02-04 NOTE — Progress Notes (Signed)
Patient ID: Jenny [name], female    DOB: 1932/12/17, 89 y.o.   MRN: [id number]  HPI  Jenny [name] is a 89 y/o female with a history of CAD, hyperlipidemia, HTN, anemia, COPD, glaucoma, atrial fibrillation, recent tobacco use and chronic heart failure.   Echo report from 08/22/21 reviewed and showed an EF of 60-65% along with mild LVH/ LAE, mild MR and severely elevated PA pressure of 65.1 mmHg.   Stress test 12/2013  Has not been admitted or been in the ED in the last 6 months. Cardioversion 01/30/22  She presents today for a HF follow-up visit with a chief complaint of minimal SOB with moderate exertion. Describes this as chronic in nature. Has associated fatigue, cough, light-headedness, chronic difficulty sleeping and numerous cat scratches on her. She denies any abdominal distention, palpitations, pedal edema, chest pain, wheezing or weight gain.   Has been alternating between torsemide and furosemide but she says that she feels like the torsemide works better.   Past Medical History:  Diagnosis Date   ACUT DUOD ULCER W/HEMORR&PERF W/O MENTION OBST 10/05/2009   NSAID induced   ALLERGIC RHINITIS CAUSE UNSPECIFIED    ANEMIA-NOS    CAD (coronary artery disease) 06/08/2009   DEs RCA with 70% LAD and EF 60%   CHF (congestive heart failure) (HCC)    COPD    mild obst on PFTs 03/2010   Diabetes mellitus 06/2010 dx   Mild, diet controlled   GLAUCOMA    HYPERLIPIDEMIA    HYPERTENSION, BENIGN    MYOCARDIAL INFARCTION 06/08/2009   des to rca   Persistent atrial fibrillation ([place])    Dx 08/2021   TOBACCO ABUSE    Past Surgical History:  Procedure Laterality Date   CARDIOVERSION N/A 01/30/2022   Procedure: CARDIOVERSION;  Surgeon: Acie Fredrickson Wonda Cheng, MD;  Location: [facility] ENDOSCOPY;  Service: Cardiovascular;  Laterality: N/A;   HEMORRHOID SURGERY  1990   Right knee surgery     Family History  Problem Relation Age of Onset   Hypertension Mother    Ulcers Mother    Stomach cancer Father     Stroke Maternal Grandmother    Heart attack Neg Hx    Social History   Tobacco Use   Smoking status: Former    Packs/day: 0.50    Years: 60.00    Total pack years: 30.00    Types: Cigarettes    Quit date: 11/12/2021    Years since quitting: 1.2   Smokeless tobacco: Never   Tobacco comments:    She lives in [place] with her significant other (Jenny [name])0  Substance Use Topics   Alcohol use: Yes    Alcohol/week: 1.0 - 2.0 standard drink of alcohol    Types: 1 - 2 Glasses of wine per week    Comment: once or twice a year glass of wine 12/25/21   Allergies  Allergen Reactions   Aspirin Other (See Comments)    Can take 81 mg not 325 mg   Atorvastatin Itching    Itching and myaliga   Cyclobenzaprine     Other reaction(s): muscle relaxers-ulcer hemorrhage (hospitalized)   Lactose Intolerance (Gi) Other (See Comments)    GI upset   Meperidine Hcl Other (See Comments)    Not known   Pravastatin Rash    rash   Propoxyphene     Other reaction(s): pain meds, hallucination, vomiting   Wound Dressing Adhesive     Other reaction(s): red, stings     Prior  to Admission medications   Medication Sig Start Date End Date Taking? Authorizing Provider  acetaminophen (TYLENOL) 500 MG tablet Take 500 mg by mouth every 6 (six) hours as needed. Takes two tablets in the morning and two tablets at night   Yes [provider]  albuterol (VENTOLIN HFA) 108 (90 Base) MCG/ACT inhaler INHALE ONE PUFF EVERY 6 HOURS AS NEEDED FOR SHORTNESS OF BREATH 11/03/21  Yes Lucrezia Starch, MD  apixaban (ELIQUIS) 5 MG TABS tablet Take 1 tablet (5 mg total) by mouth 2 (two) times daily. 10/11/21  Yes Turner, Eber Hong, MD  cephALEXin (KEFLEX) 500 MG capsule Take 500 mg by mouth 3 (three) times daily. 01/29/23  Yes [provider]  dorzolamide (TRUSOPT) 2 % ophthalmic solution Place 1 drop into both eyes 2 (two) times daily.   Yes [provider]  Fluticasone-Umeclidin-Vilant (TRELEGY ELLIPTA)  100-62.5-25 MCG/INH AEPB Inhale 1 puff into the lungs daily. 08/27/21  Yes Mercy Riding, MD  furosemide (LASIX) 40 MG tablet Take 1 tablet (40 mg total) by mouth daily. 01/28/23  Yes Ellyse Rotolo, Otila Kluver A, FNP  gabapentin (NEURONTIN) 100 MG capsule Take 100-200 mg by mouth See admin instructions. Take 1 capsule ('100mg'$ ) by mouth every morning and take 2 capsules ('200mg'$ ) by mouth at bedtime   Yes [provider]  hydrOXYzine (ATARAX) 25 MG tablet Take 25 mg by mouth every 8 (eight) hours as needed for itching.   Yes [provider]  ipratropium-albuterol (DUONEB) 0.5-2.5 (3) MG/3ML SOLN Take 3 mLs by nebulization every 6 (six) hours as needed. 11/06/21  Yes Fritzi Mandes, MD  latanoprost (XALATAN) 0.005 % ophthalmic solution Place 1 drop into both eyes in the morning.   Yes [provider]  nystatin (MYCOSTATIN/NYSTOP) powder Apply 1 application topically daily. 01/22/22  Yes [provider]  rosuvastatin (CRESTOR) 20 MG tablet Take 20 mg by mouth daily.   Yes [provider]  torsemide (DEMADEX) 20 MG tablet Take 40 mg by mouth daily. Alternating with furosemide '40mg'$  daily   Yes [provider]  magnesium hydroxide (MILK OF MAGNESIA) 400 MG/5ML suspension Take 30 mLs by mouth as needed for mild constipation. Patient not taking: Reported on 02/04/2023    [provider]  NITROSTAT 0.4 MG SL tablet DISSOLVE ONE TABLET UNDER TONGUE AS NEEDED FOR CHEST PAIN - MAY REPEAT TWICE-IF NO RELIEF GO TO NEAREST HOSPITAL ER Patient not taking: Reported on 02/04/2023 05/06/13   Rowe Clack, MD    Review of Systems  Constitutional:  Positive for fatigue. Negative for appetite change.  HENT:  Negative for congestion, postnasal drip and sore throat.   Eyes: Negative.   Respiratory:  Positive for cough and shortness of breath. Negative for chest tightness and wheezing.   Cardiovascular:  Negative for chest pain, palpitations and leg swelling.   Gastrointestinal:  Negative for abdominal distention and abdominal pain.  Endocrine: Negative.   Genitourinary: Negative.   Musculoskeletal:  Negative for back pain.  Skin: Negative.        Numerous cat scratches  Allergic/Immunologic: Negative.   Neurological:  Positive for light-headedness (with sudden position changes). Negative for dizziness.  Hematological:  Negative for adenopathy. Bruises/bleeds easily.  Psychiatric/Behavioral:  Positive for sleep disturbance (chronic issue; sleeping sitting on sofa). Negative for dysphoric mood. The patient is not nervous/anxious.    Vitals:   02/04/23 0950  BP: (!) 127/59  Pulse: (!) 42  Resp: 18  SpO2: 99%  Weight: 202 lb (91.6 kg)  Height: '5\' 2"'$  (1.575 m)   Wt Readings from Last 3 Encounters:  02/04/23 202 lb (91.6 kg)  01/17/23 204 lb 6.4 oz (92.7 kg)  11/07/22 218 lb (98.9 kg)   Lab Results  Component Value Date   CREATININE 1.24 (H) 01/30/2022   CREATININE 1.36 (H) 01/14/2022   CREATININE 1.18 (H) 01/03/2022   Physical Exam Vitals and nursing note reviewed. Exam conducted with a chaperone present (son).  Constitutional:      Appearance: Normal appearance.  HENT:     Head: Normocephalic.  Cardiovascular:     Rate and Rhythm: Bradycardia present. Rhythm irregular.  Pulmonary:     Effort: Pulmonary effort is normal. No respiratory distress.     Breath sounds: No wheezing or rales.  Abdominal:     General: There is no distension.     Palpations: Abdomen is soft.     Tenderness: There is no abdominal tenderness.  Musculoskeletal:        General: No tenderness.     Cervical back: Normal range of motion and neck supple.     Right lower leg: No edema.     Left lower leg: No edema.  Skin:    General: Skin is warm and dry.  Neurological:     General: No focal deficit present.     Mental Status: She is alert and oriented to person, place, and time.  Psychiatric:        Mood and Affect: Mood normal.        Behavior:  Behavior normal.        Thought Content: Thought content normal.    Assessment & Plan:  1: Chronic heart failure with preserved ejection fraction with LVH/LAE- - NYHA class II - euvolemic today - weighing daily; reminded to call for an overnight weight gain of > 2 pounds or a weekly weight gain of > 5 pounds - weight up 4 pounds from last visit here 3 months ago - not adding salt to her food and tries to eat low sodium foods  - has been alternating torsemide/ furosemide but feels like the torsemide works better; will stop the furosemide and use torsemide '40mg'$  BID - will get BMP today as she may need potassium supplementation - saw cardiology Julianne Handler) 01/17/23 - ReDs clip reading today was normal at 30% - SOB worsened when taking entresto; consider SGLT2 - BNP 12/07/21 was 371.3 - PharmD reconciled meds w/ patient  2: HTN- - BP 127/59 - seeing [facility] for primary care every 2 weeks - BMP 01/30/23 reviewed and showed sodium 135, potassium 4.0, creatinine 1.24 and GFR 42 - saw nephrology (Korrapati) 02/21/22  3: Persistent atrial fibrillation- - saw EP Caryl Comes) 03/28/22 - had cardioversion 01/31/22 & post cardioversion, developed Mobitz 2nd degree AV block  4: COPD- - stopped smoking fall 2022 - continued cessation discussed for 3 minutes  - using trelegy daily and nebulizer PRN   Medication bottles reviewed.   Return in 1 month, sooner if needed. BMP at next visit

## 2023-02-06 DIAGNOSIS — I110 Hypertensive heart disease with heart failure: Secondary | ICD-10-CM | POA: Diagnosis not present

## 2023-02-06 DIAGNOSIS — I4819 Other persistent atrial fibrillation: Secondary | ICD-10-CM | POA: Diagnosis not present

## 2023-02-06 DIAGNOSIS — I5030 Unspecified diastolic (congestive) heart failure: Secondary | ICD-10-CM | POA: Diagnosis not present

## 2023-02-06 DIAGNOSIS — E1151 Type 2 diabetes mellitus with diabetic peripheral angiopathy without gangrene: Secondary | ICD-10-CM | POA: Diagnosis not present

## 2023-02-06 DIAGNOSIS — L299 Pruritus, unspecified: Secondary | ICD-10-CM | POA: Diagnosis not present

## 2023-02-06 DIAGNOSIS — J449 Chronic obstructive pulmonary disease, unspecified: Secondary | ICD-10-CM | POA: Diagnosis not present

## 2023-02-13 DIAGNOSIS — E1151 Type 2 diabetes mellitus with diabetic peripheral angiopathy without gangrene: Secondary | ICD-10-CM | POA: Diagnosis not present

## 2023-02-13 DIAGNOSIS — L299 Pruritus, unspecified: Secondary | ICD-10-CM | POA: Diagnosis not present

## 2023-02-13 DIAGNOSIS — I4819 Other persistent atrial fibrillation: Secondary | ICD-10-CM | POA: Diagnosis not present

## 2023-02-13 DIAGNOSIS — J449 Chronic obstructive pulmonary disease, unspecified: Secondary | ICD-10-CM | POA: Diagnosis not present

## 2023-02-13 DIAGNOSIS — I5030 Unspecified diastolic (congestive) heart failure: Secondary | ICD-10-CM | POA: Diagnosis not present

## 2023-02-13 DIAGNOSIS — I110 Hypertensive heart disease with heart failure: Secondary | ICD-10-CM | POA: Diagnosis not present

## 2023-02-18 DIAGNOSIS — I110 Hypertensive heart disease with heart failure: Secondary | ICD-10-CM | POA: Diagnosis not present

## 2023-02-18 DIAGNOSIS — E1151 Type 2 diabetes mellitus with diabetic peripheral angiopathy without gangrene: Secondary | ICD-10-CM | POA: Diagnosis not present

## 2023-02-18 DIAGNOSIS — I5030 Unspecified diastolic (congestive) heart failure: Secondary | ICD-10-CM | POA: Diagnosis not present

## 2023-02-18 DIAGNOSIS — L299 Pruritus, unspecified: Secondary | ICD-10-CM | POA: Diagnosis not present

## 2023-02-18 DIAGNOSIS — J449 Chronic obstructive pulmonary disease, unspecified: Secondary | ICD-10-CM | POA: Diagnosis not present

## 2023-02-18 DIAGNOSIS — I4819 Other persistent atrial fibrillation: Secondary | ICD-10-CM | POA: Diagnosis not present

## 2023-02-18 DIAGNOSIS — E876 Hypokalemia: Secondary | ICD-10-CM | POA: Diagnosis not present

## 2023-02-19 LAB — BASIC METABOLIC PANEL: EGFR: 24

## 2023-02-20 ENCOUNTER — Telehealth: Payer: Self-pay | Admitting: Family

## 2023-02-20 NOTE — Telephone Encounter (Signed)
BMP results from home health dated 02/18/23:  BUN 46 Creatinine 1.95 eGFR 24 Potassium 4.8

## 2023-02-23 DIAGNOSIS — J449 Chronic obstructive pulmonary disease, unspecified: Secondary | ICD-10-CM | POA: Diagnosis not present

## 2023-02-23 DIAGNOSIS — I5032 Chronic diastolic (congestive) heart failure: Secondary | ICD-10-CM | POA: Diagnosis not present

## 2023-02-23 DIAGNOSIS — I2510 Atherosclerotic heart disease of native coronary artery without angina pectoris: Secondary | ICD-10-CM | POA: Diagnosis not present

## 2023-02-23 DIAGNOSIS — I110 Hypertensive heart disease with heart failure: Secondary | ICD-10-CM | POA: Diagnosis not present

## 2023-02-26 DIAGNOSIS — E1142 Type 2 diabetes mellitus with diabetic polyneuropathy: Secondary | ICD-10-CM | POA: Diagnosis not present

## 2023-02-26 DIAGNOSIS — E1122 Type 2 diabetes mellitus with diabetic chronic kidney disease: Secondary | ICD-10-CM | POA: Diagnosis not present

## 2023-02-26 DIAGNOSIS — I130 Hypertensive heart and chronic kidney disease with heart failure and stage 1 through stage 4 chronic kidney disease, or unspecified chronic kidney disease: Secondary | ICD-10-CM | POA: Diagnosis not present

## 2023-02-26 DIAGNOSIS — B372 Candidiasis of skin and nail: Secondary | ICD-10-CM | POA: Diagnosis not present

## 2023-02-26 DIAGNOSIS — Z6836 Body mass index (BMI) 36.0-36.9, adult: Secondary | ICD-10-CM | POA: Diagnosis not present

## 2023-02-26 DIAGNOSIS — R54 Age-related physical debility: Secondary | ICD-10-CM | POA: Diagnosis not present

## 2023-02-26 DIAGNOSIS — N1832 Chronic kidney disease, stage 3b: Secondary | ICD-10-CM | POA: Diagnosis not present

## 2023-02-26 DIAGNOSIS — I5030 Unspecified diastolic (congestive) heart failure: Secondary | ICD-10-CM | POA: Diagnosis not present

## 2023-03-03 DIAGNOSIS — R0602 Shortness of breath: Secondary | ICD-10-CM | POA: Diagnosis not present

## 2023-03-03 DIAGNOSIS — J449 Chronic obstructive pulmonary disease, unspecified: Secondary | ICD-10-CM | POA: Diagnosis not present

## 2023-03-04 DIAGNOSIS — H402221 Chronic angle-closure glaucoma, left eye, mild stage: Secondary | ICD-10-CM | POA: Diagnosis not present

## 2023-03-05 ENCOUNTER — Other Ambulatory Visit (HOSPITAL_COMMUNITY): Payer: Self-pay

## 2023-03-05 ENCOUNTER — Encounter: Payer: Medicare Other | Admitting: Family

## 2023-03-05 NOTE — Progress Notes (Deleted)
Patient ID: Jenny [name], female    DOB: October 24, 1933, 89 y.o.   MRN: [id number]  HPI  [name] is a 89 y/o female with a history of CAD, hyperlipidemia, HTN, anemia, COPD, glaucoma, atrial fibrillation, recent tobacco use and chronic heart failure.   Echo 08/22/21: EF of 60-65% along with mild LVH/ LAE, mild MR and severely elevated PA pressure of 65.1 mmHg.   Stress test 12/2013  Has not been admitted or been in the ED in the last 6 months. Cardioversion 01/30/22  She presents today for a HF follow-up visit with a chief complaint of   Past Medical History:  Diagnosis Date   ACUT DUOD ULCER W/HEMORR&PERF W/O MENTION OBST 10/05/2009   NSAID induced   ALLERGIC RHINITIS CAUSE UNSPECIFIED    ANEMIA-NOS    CAD (coronary artery disease) 06/08/2009   DEs RCA with 70% LAD and EF 60%   CHF (congestive heart failure) (HCC)    COPD    mild obst on PFTs 03/2010   Diabetes mellitus 06/2010 dx   Mild, diet controlled   GLAUCOMA    HYPERLIPIDEMIA    HYPERTENSION, BENIGN    MYOCARDIAL INFARCTION 06/08/2009   des to rca   Persistent atrial fibrillation ([place])    Dx 08/2021   TOBACCO ABUSE    Past Surgical History:  Procedure Laterality Date   CARDIOVERSION N/A 01/30/2022   Procedure: CARDIOVERSION;  Surgeon: Acie Fredrickson Wonda Cheng, MD;  Location: [facility];  Service: Cardiovascular;  Laterality: N/A;   HEMORRHOID SURGERY  1990   Right knee surgery     Family History  Problem Relation Age of Onset   Hypertension Mother    Ulcers Mother    Stomach cancer Father    Stroke Maternal Grandmother    Heart attack Neg Hx    Social History   Tobacco Use   Smoking status: Former    Packs/day: 0.50    Years: 60.00    Additional pack years: 0.00    Total pack years: 30.00    Types: Cigarettes    Quit date: 11/12/2021    Years since quitting: 1.3   Smokeless tobacco: Never   Tobacco comments:    She lives in [place] with her significant other ([place])0  Substance Use Topics   Alcohol use:  Yes    Alcohol/week: 1.0 - 2.0 standard drink of alcohol    Types: 1 - 2 Glasses of wine per week    Comment: once or twice a year glass of wine 12/25/21   Allergies  Allergen Reactions   Aspirin Other (See Comments)    Can take 81 mg not 325 mg   Atorvastatin Itching    Itching and myaliga   Cyclobenzaprine     Other reaction(s): muscle relaxers-ulcer hemorrhage (hospitalized)   Lactose Intolerance (Gi) Other (See Comments)    GI upset   Meperidine Hcl Other (See Comments)    Not known   Pravastatin Rash    rash   Propoxyphene     Other reaction(s): pain meds, hallucination, vomiting   Wound Dressing Adhesive     Other reaction(s): red, stings       Review of Systems  Constitutional:  Positive for fatigue. Negative for appetite change.  HENT:  Negative for congestion, postnasal drip and sore throat.   Eyes: Negative.   Respiratory:  Positive for cough and shortness of breath. Negative for chest tightness and wheezing.   Cardiovascular:  Negative for chest pain, palpitations and leg swelling.  Gastrointestinal:  Negative for abdominal distention and abdominal pain.  Endocrine: Negative.   Genitourinary: Negative.   Musculoskeletal:  Negative for back pain.  Skin: Negative.        Numerous cat scratches  Allergic/Immunologic: Negative.   Neurological:  Positive for light-headedness (with sudden position changes). Negative for dizziness.  Hematological:  Negative for adenopathy. Bruises/bleeds easily.  Psychiatric/Behavioral:  Positive for sleep disturbance (chronic issue; sleeping sitting on sofa). Negative for dysphoric mood. The patient is not nervous/anxious.     Physical Exam Vitals and nursing note reviewed. Exam conducted with a chaperone present (son).  Constitutional:      Appearance: Normal appearance.  HENT:     Head: Normocephalic.  Cardiovascular:     Rate and Rhythm: Bradycardia present. Rhythm irregular.  Pulmonary:     Effort: Pulmonary effort is  normal. No respiratory distress.     Breath sounds: No wheezing or rales.  Abdominal:     General: There is no distension.     Palpations: Abdomen is soft.     Tenderness: There is no abdominal tenderness.  Musculoskeletal:        General: No tenderness.     Cervical back: Normal range of motion and neck supple.     Right lower leg: No edema.     Left lower leg: No edema.  Skin:    General: Skin is warm and dry.  Neurological:     General: No focal deficit present.     Mental Status: She is alert and oriented to person, place, and time.  Psychiatric:        Mood and Affect: Mood normal.        Behavior: Behavior normal.        Thought Content: Thought content normal.    Assessment & Plan:  1: Chronic heart failure with preserved ejection fraction with LVH/LAE- - NYHA class II - euvolemic today - weighing daily; reminded to call for an overnight weight gain of > 2 pounds or a weekly weight gain of > 5 pounds - weight 202 pounds from last visit here 1 month ago  - not adding salt to her food and tries to eat low sodium foods  - continue torsemide 40mg  BID - BMP today - saw cardiology Julianne Handler) 01/17/23 - SOB worsened when taking entresto; consider SGLT2 - BNP 12/07/21 was 371.3 - PharmD reconciled meds w/ patient  2: HTN- - BP  - seeing [facility] for primary care every 2 weeks - BMP 02/18/23, from home health, reviewed and showed potassium 4.8, creatinine 1.95 and GFR 24 - saw nephrology (Korrapati) 02/21/22  3: Persistent atrial fibrillation- - saw EP Caryl Comes) 03/28/22 - had cardioversion 01/31/22 & post cardioversion, developed Mobitz 2nd degree AV block  4: COPD- - stopped smoking fall 2022 - continued cessation discussed for 3 minutes  - using trelegy daily and nebulizer PRN - saw pulmonology Lanney Gins) 03/03/23

## 2023-03-07 DIAGNOSIS — I5030 Unspecified diastolic (congestive) heart failure: Secondary | ICD-10-CM | POA: Diagnosis not present

## 2023-03-07 DIAGNOSIS — E785 Hyperlipidemia, unspecified: Secondary | ICD-10-CM | POA: Diagnosis not present

## 2023-03-07 DIAGNOSIS — R54 Age-related physical debility: Secondary | ICD-10-CM | POA: Diagnosis not present

## 2023-03-07 DIAGNOSIS — F331 Major depressive disorder, recurrent, moderate: Secondary | ICD-10-CM | POA: Diagnosis not present

## 2023-03-07 DIAGNOSIS — J449 Chronic obstructive pulmonary disease, unspecified: Secondary | ICD-10-CM | POA: Diagnosis not present

## 2023-03-07 DIAGNOSIS — D649 Anemia, unspecified: Secondary | ICD-10-CM | POA: Diagnosis not present

## 2023-03-07 DIAGNOSIS — E1142 Type 2 diabetes mellitus with diabetic polyneuropathy: Secondary | ICD-10-CM | POA: Diagnosis not present

## 2023-03-07 DIAGNOSIS — H409 Unspecified glaucoma: Secondary | ICD-10-CM | POA: Diagnosis not present

## 2023-03-09 DIAGNOSIS — I5032 Chronic diastolic (congestive) heart failure: Secondary | ICD-10-CM | POA: Diagnosis not present

## 2023-03-09 DIAGNOSIS — J449 Chronic obstructive pulmonary disease, unspecified: Secondary | ICD-10-CM | POA: Diagnosis not present

## 2023-03-09 DIAGNOSIS — I2510 Atherosclerotic heart disease of native coronary artery without angina pectoris: Secondary | ICD-10-CM | POA: Diagnosis not present

## 2023-03-09 DIAGNOSIS — I110 Hypertensive heart disease with heart failure: Secondary | ICD-10-CM | POA: Diagnosis not present

## 2023-03-10 ENCOUNTER — Other Ambulatory Visit: Payer: Self-pay | Admitting: Cardiology

## 2023-03-10 NOTE — Telephone Encounter (Signed)
Prescription refill request for Eliquis received. Indication: AF Last office visit: 2/24 - [place] Scr: 1.48 Age: 89 Weight: 91.6

## 2023-03-11 ENCOUNTER — Other Ambulatory Visit
Admission: RE | Admit: 2023-03-11 | Discharge: 2023-03-11 | Disposition: A | Discharge status: Home / Self Care | Payer: Medicare Other | Source: Ambulatory Visit | Attending: Family | Admitting: Family

## 2023-03-11 ENCOUNTER — Encounter: Payer: Self-pay | Admitting: Family

## 2023-03-11 ENCOUNTER — Ambulatory Visit (HOSPITAL_BASED_OUTPATIENT_CLINIC_OR_DEPARTMENT_OTHER): Payer: Medicare Other | Admitting: Family

## 2023-03-11 VITALS — BP 144/42 | HR 47 | Resp 18 | Wt 200.0 lb

## 2023-03-11 DIAGNOSIS — I10 Essential (primary) hypertension: Secondary | ICD-10-CM

## 2023-03-11 DIAGNOSIS — I480 Paroxysmal atrial fibrillation: Secondary | ICD-10-CM

## 2023-03-11 DIAGNOSIS — I5032 Chronic diastolic (congestive) heart failure: Secondary | ICD-10-CM | POA: Insufficient documentation

## 2023-03-11 DIAGNOSIS — J449 Chronic obstructive pulmonary disease, unspecified: Secondary | ICD-10-CM

## 2023-03-11 LAB — BASIC METABOLIC PANEL
Anion gap: 11 (ref 5–15)
BUN: 46 mg/dL — ABNORMAL HIGH (ref 8–23)
CO2: 21 mmol/L — ABNORMAL LOW (ref 22–32)
Calcium: 7.8 mg/dL — ABNORMAL LOW (ref 8.9–10.3)
Chloride: 106 mmol/L (ref 98–111)
Creatinine, Ser: 1.55 mg/dL — ABNORMAL HIGH (ref 0.44–1.00)
GFR, Estimated: 32 mL/min — ABNORMAL LOW (ref >60)
Glucose, Bld: 121 mg/dL — ABNORMAL HIGH (ref 70–99)
Potassium: 3.9 mmol/L (ref 3.5–5.1)
Sodium: 138 mmol/L (ref 135–145)

## 2023-03-11 NOTE — Progress Notes (Signed)
Patient ID: Jenny [name], female    DOB: April 06, 1933, 89 y.o.   MRN: [id number]  HPI  Jenny [name] is a 89 y/o female with a history of CAD, hyperlipidemia, HTN, anemia, COPD, glaucoma, atrial fibrillation, recent tobacco use and chronic heart failure.   Echo 08/22/21: EF of 60-65% along with mild LVH/ LAE, mild MR and severely elevated PA pressure of 65.1 mmHg.   Stress test 12/2013  Has not been admitted or been in the ED in the last 6 months. Cardioversion 01/30/22  She presents today for a HF follow-up visit with a chief complaint of minimal SOB w/ moderate exertion. Chronic in nature although she feels like it has improved since she recently saw pulmonology. Has associated fatigue, cough, light-headedness & chronic difficulty sleeping along with this. Denies abdominal distention, palpitations, pedal edema, chest pain, wheezing or weight gain.  Taking 40mg  torsemide daily instead of BID because she felt the BID dose was too much.    Past Medical History:  Diagnosis Date   ACUT DUOD ULCER W/HEMORR&PERF W/O MENTION OBST 10/05/2009   NSAID induced   ALLERGIC RHINITIS CAUSE UNSPECIFIED    ANEMIA-NOS    CAD (coronary artery disease) 06/08/2009   DEs RCA with 70% LAD and EF 60%   CHF (congestive heart failure) (HCC)    COPD    mild obst on PFTs 03/2010   Diabetes mellitus 06/2010 dx   Mild, diet controlled   GLAUCOMA    HYPERLIPIDEMIA    HYPERTENSION, BENIGN    MYOCARDIAL INFARCTION 06/08/2009   des to rca   Persistent atrial fibrillation ([place])    Dx 08/2021   TOBACCO ABUSE    Past Surgical History:  Procedure Laterality Date   CARDIOVERSION N/A 01/30/2022   Procedure: CARDIOVERSION;  Surgeon: Acie Fredrickson Wonda Cheng, MD;  Location: [facility];  Service: Cardiovascular;  Laterality: N/A;   HEMORRHOID SURGERY  1990   Right knee surgery     Family History  Problem Relation Age of Onset   Hypertension Mother    Ulcers Mother    Stomach cancer Father    Stroke Maternal Grandmother     Heart attack Neg Hx    Social History   Tobacco Use   Smoking status: Former    Packs/day: 0.50    Years: 60.00    Additional pack years: 0.00    Total pack years: 30.00    Types: Cigarettes    Quit date: 11/12/2021    Years since quitting: 1.3   Smokeless tobacco: Never   Tobacco comments:    She lives in [place] with her significant other ([place])0  Substance Use Topics   Alcohol use: Yes    Alcohol/week: 1.0 - 2.0 standard drink of alcohol    Types: 1 - 2 Glasses of wine per week    Comment: once or twice a year glass of wine 12/25/21   Allergies  Allergen Reactions   Entresto [Sacubitril-Valsartan] Shortness Of Breath    COPD and reports increased SOB with Entresto   Aspirin Other (See Comments)    Can take 81 mg not 325 mg   Atorvastatin Itching    Itching and myaliga   Cyclobenzaprine     Other reaction(s): muscle relaxers-ulcer hemorrhage (hospitalized)   Lactose Intolerance (Gi) Other (See Comments)    GI upset   Meperidine Hcl Other (See Comments)    Not known   Pravastatin Rash    rash   Propoxyphene     Other reaction(s): pain  meds, hallucination, vomiting   Wound Dressing Adhesive     Other reaction(s): red, stings     Prior to Admission medications   Medication Sig Start Date End Date Taking? Authorizing Provider  acetaminophen (TYLENOL) 500 MG tablet Take 500 mg by mouth every 6 (six) hours as needed. Takes two tablets in the morning and two tablets at night   Yes [provider]  albuterol (VENTOLIN HFA) 108 (90 Base) MCG/ACT inhaler INHALE ONE PUFF EVERY 6 HOURS AS NEEDED FOR SHORTNESS OF BREATH 11/03/21  Yes Lucrezia Starch, MD  apixaban (ELIQUIS) 5 MG TABS tablet TAKE ONE TABLET TWICE DAILY 03/10/23  Yes Burnell Blanks, MD  dorzolamide (TRUSOPT) 2 % ophthalmic solution Place 1 drop into both eyes 2 (two) times daily.   Yes [provider]  fluconazole (DIFLUCAN) 100 MG tablet Take 100 mg by mouth every other day. 02/26/23  03/12/23 Yes [provider]  Fluticasone-Umeclidin-Vilant (TRELEGY ELLIPTA) 100-62.5-25 MCG/INH AEPB Inhale 1 puff into the lungs daily. 08/27/21  Yes Mercy Riding, MD  gabapentin (NEURONTIN) 100 MG capsule Take 100-200 mg by mouth See admin instructions. Take 1 capsule (100mg ) by mouth every morning and take 2 capsules (200mg ) by mouth at bedtime   Yes [provider]  hydrOXYzine (ATARAX) 25 MG tablet Take 25 mg by mouth every 8 (eight) hours as needed for itching.   Yes [provider]  ipratropium-albuterol (DUONEB) 0.5-2.5 (3) MG/3ML SOLN Take 3 mLs by nebulization every 6 (six) hours as needed. 11/06/21  Yes Fritzi Mandes, MD  latanoprost (XALATAN) 0.005 % ophthalmic solution Place 1 drop into both eyes in the morning.   Yes [provider]  magnesium hydroxide (MILK OF MAGNESIA) 400 MG/5ML suspension Take 30 mLs by mouth as needed for mild constipation.   Yes [provider]  NITROSTAT 0.4 MG SL tablet DISSOLVE ONE TABLET UNDER TONGUE AS NEEDED FOR CHEST PAIN - MAY REPEAT TWICE-IF NO RELIEF GO TO NEAREST HOSPITAL ER 05/06/13  Yes Rowe Clack, MD  nystatin (MYCOSTATIN/NYSTOP) powder Apply 1 application topically daily. 01/22/22  Yes [provider]  potassium chloride SA (KLOR-CON M) 20 MEQ tablet Take 1 tablet (20 mEq total) by mouth 2 (two) times daily. 02/04/23  Yes Persia Lintner, Otila Kluver A, FNP  predniSONE (DELTASONE) 10 MG tablet Take 1 tablet by mouth daily. 03/03/23  Yes [provider]  roflumilast (DALIRESP) 500 MCG TABS tablet Take 500 mcg by mouth daily. 03/03/23  Yes [provider]  rosuvastatin (CRESTOR) 20 MG tablet Take 20 mg by mouth daily.   Yes [provider]  sulfamethoxazole-trimethoprim (BACTRIM) 400-80 MG tablet Take 1 tablet by mouth daily. 03/03/23 03/17/23 Yes [provider]  torsemide (DEMADEX) 20 MG tablet Take 2 tablets (40 mg total) by mouth 2 (two) times daily. 02/04/23  Yes Alisa Graff, FNP   Review of Systems  Constitutional:  Positive for fatigue. Negative for appetite change.  HENT:  Negative for congestion, postnasal drip and sore throat.   Eyes: Negative.   Respiratory:  Positive for cough and shortness of breath. Negative for chest tightness and wheezing.   Cardiovascular:  Negative for chest pain, palpitations and leg swelling.  Gastrointestinal:  Negative for abdominal distention and abdominal pain.  Endocrine: Negative.   Genitourinary: Negative.   Musculoskeletal:  Negative for back pain.  Skin: Negative.        Numerous cat scratches  Allergic/Immunologic: Negative.   Neurological:  Positive for light-headedness (with sudden position  changes). Negative for dizziness.  Hematological:  Negative for adenopathy. Bruises/bleeds easily.  Psychiatric/Behavioral:  Positive for sleep disturbance (chronic issue; sleeping sitting on sofa). Negative for dysphoric mood. The patient is not nervous/anxious.    Vitals:   03/11/23 0959  BP: (!) 144/42  Pulse: (!) 47  Resp: 18  SpO2: 100%  Weight: 200 lb (90.7 kg)   Wt Readings from Last 3 Encounters:  03/11/23 200 lb (90.7 kg)  02/04/23 202 lb (91.6 kg)  01/17/23 204 lb 6.4 oz (92.7 kg)   Lab Results  Component Value Date   CREATININE 1.48 (H) 02/04/2023   CREATININE 1.24 (H) 01/30/2022   CREATININE 1.36 (H) 01/14/2022   Physical Exam Vitals and nursing note reviewed.  Constitutional:      Appearance: Normal appearance.  HENT:     Head: Normocephalic.  Cardiovascular:     Rate and Rhythm: Bradycardia present. Rhythm irregular.  Pulmonary:     Effort: Pulmonary effort is normal. No respiratory distress.     Breath sounds: No wheezing or rales.  Abdominal:     General: There is no distension.     Palpations: Abdomen is soft.     Tenderness: There is no abdominal tenderness.  Musculoskeletal:        General: No tenderness.     Cervical back: Normal range of motion and neck supple.     Right lower  leg: No edema.     Left lower leg: No edema.  Skin:    General: Skin is warm and dry.  Neurological:     General: No focal deficit present.     Mental Status: She is alert and oriented to person, place, and time.  Psychiatric:        Mood and Affect: Mood normal.        Behavior: Behavior normal.        Thought Content: Thought content normal.    Assessment & Plan:  1: Chronic heart failure with preserved ejection fraction with LVH/LAE- - NYHA class II - euvolemic today - weighing daily; reminded to call for an overnight weight gain of > 2 pounds or a weekly weight gain of > 5 pounds - weight down 2 pounds from last visit here 1 month ago - Echo 08/22/21: EF of 60-65% along with mild LVH/ LAE, mild MR and severely elevated PA pressure of 65.1 mmHg. - updated echo has been scheduled for 04/09/23; consider SGLT2/ possibly ARB after these results - not adding salt to her food and tries to eat low sodium foods  - continue torsemide 40mg  daily - continue potassium 76meq QOD - SOB worsened when taking entresto - BMP today - saw cardiology Julianne Handler) 01/17/23 - BNP 12/07/21 was 371.3  2: HTN- - BP 144/42 - seeing [facility] for primary care every 2 weeks - BMP 02/18/23, from home health, reviewed and showed potassium 4.8, creatinine 1.95 and GFR 24 - saw nephrology Lanora Manis) 02/21/22  3: Persistent atrial fibrillation- - saw EP Caryl Comes) 03/28/22 - continue apixaban 5mg  BID - had cardioversion 01/31/22 & post cardioversion, developed Mobitz 2nd degree AV block  4: COPD- - stopped smoking fall 2022 - continued cessation discussed for 3 minutes  - using trelegy daily and nebulizer PRN - saw pulmonology Lanney Gins) 03/03/23 - now on prednisone 10mg  daily (for 2 weeks) and roflumilast 254mcg daily   Return here in 1 month after echo, sooner if needed

## 2023-03-17 ENCOUNTER — Ambulatory Visit (INDEPENDENT_AMBULATORY_CARE_PROVIDER_SITE_OTHER): Payer: Medicare Other | Admitting: Podiatry

## 2023-03-17 ENCOUNTER — Encounter: Payer: Self-pay | Admitting: Podiatry

## 2023-03-17 DIAGNOSIS — B351 Tinea unguium: Secondary | ICD-10-CM | POA: Diagnosis not present

## 2023-03-17 DIAGNOSIS — D689 Coagulation defect, unspecified: Secondary | ICD-10-CM

## 2023-03-17 DIAGNOSIS — E119 Type 2 diabetes mellitus without complications: Secondary | ICD-10-CM

## 2023-03-17 DIAGNOSIS — M79675 Pain in left toe(s): Secondary | ICD-10-CM

## 2023-03-17 DIAGNOSIS — M79674 Pain in right toe(s): Secondary | ICD-10-CM | POA: Diagnosis not present

## 2023-03-17 NOTE — Progress Notes (Signed)
Subjective:  Patient ID: Jenny [name], female    DOB: [birth year],   MRN: [id number]  No chief complaint on file.   89 y.o. female presents for concern of thickened elongated and painful nails that are difficult to trim. Requesting to have them trimmed today.  She has been dealing with toenail fungus for 30+ years. Does relates some pain in her feet after walking for periods.  Relates burning and tingling in their feet. Patient is diabetic and last A1c was  Lab Results  Component Value Date   HGBA1C 6.4 (H) 11/05/2021   .   PCP:  [facility]    . Denies any other pedal complaints. Denies n/v/f/c.   Past Medical History:  Diagnosis Date   ACUT DUOD ULCER W/HEMORR&PERF W/O MENTION OBST 10/05/2009   NSAID induced   ALLERGIC RHINITIS CAUSE UNSPECIFIED    ANEMIA-NOS    CAD (coronary artery disease) 06/08/2009   DEs RCA with 70% LAD and EF 60%   CHF (congestive heart failure)    COPD    mild obst on PFTs 03/2010   Diabetes mellitus 06/2010 dx   Mild, diet controlled   GLAUCOMA    HYPERLIPIDEMIA    HYPERTENSION, BENIGN    MYOCARDIAL INFARCTION 06/08/2009   des to rca   Persistent atrial fibrillation    Dx 08/2021   TOBACCO ABUSE     Objective:  Physical Exam: Vascular: DP/PT pulses 0/4 bilateral. CFT <3 seconds. Absent hair growth on digits. Edema noted to bilateral lower extremities. Xerosis noted bilaterally. Erythema noted to distal extremities as well.  Skin. No lacerations or abrasions bilateral feet. Nails 1-5 bilateral  are thickened discolored and elongated with subungual debris.  Musculoskeletal: MMT 5/5 bilateral lower extremities in DF, PF, Inversion and Eversion. Deceased ROM in DF of ankle joint.  Neurological: Sensation intact to light touch. Protective sensation diminished bilateral.     LOWER EXTREMITY DOPPLER STUDY  Patient Name:  Jenny BalesBetty J [name]  Date of Exam:   01/24/2023 Medical Rec #: [id number]      Accession #:    [id number] Date of Birth:  [birth year]      Patient Gender: F Patient Age:   [age] years Exam Location:  [facility] Procedure:      VAS US ABI WITH/WO TBI Referring Phys: Taariq Leitz   --------------------------------------------------------------------------- -----   Indications: Patient reports years of bilateral leg pain that goes from hips              down to feet. It starts after about a block of walking. Patient              also reports the base of her spine is detoriating.  High Risk Factors: Hypertension, hyperlipidemia, Diabetes, past history of                    smoking, prior MI, coronary artery disease.  Other Factors: Afib.  Comparison Study: NA  Performing Technologist: Jeryl ColumbiaJohanna Elliott RDCS    Examination Guidelines: A complete evaluation includes at minimum, Doppler waveform signals and systolic blood pressure reading at the level of bilateral brachial, anterior tibial, and posterior tibial arteries, when vessel segments are accessible. Bilateral testing is considered an integral part of a complete examination. Photoelectric Plethysmograph (PPG) waveforms and toe systolic pressure readings are included as required and additional duplex testing as needed. Limited examinations for reoccurring indications may be performed as noted.    ABI Findings: +---------+------------------+-----+--------+--------+ Right    Rt  Pressure (mmHg)IndexWaveformComment  +---------+------------------+-----+--------+--------+ Brachial 142                                     +---------+------------------+-----+--------+--------+ PTA      154               1.08 biphasic         +---------+------------------+-----+--------+--------+ PERO     122               0.86 biphasic         +---------+------------------+-----+--------+--------+ DP       116               0.82 biphasic         +---------+------------------+-----+--------+--------+ Great Toe115               0.81  Normal           +---------+------------------+-----+--------+--------+  +---------+------------------+-----+--------+-------+ Left     Lt Pressure (mmHg)IndexWaveformComment +---------+------------------+-----+--------+-------+ Brachial 139                                    +---------+------------------+-----+--------+-------+ PTA      145               1.02 biphasic        +---------+------------------+-----+--------+-------+ PERO     135               0.95 biphasic        +---------+------------------+-----+--------+-------+ DP       141               0.99 biphasic        +---------+------------------+-----+--------+-------+ Great Toe146               1.03 Normal          +---------+------------------+-----+--------+-------+  +-------+-----------+-----------+------------+------------+ ABI/TBIToday's ABIToday's TBIPrevious ABIPrevious TBI +-------+-----------+-----------+------------+------------+ Right  1.08       0.81                                +-------+-----------+-----------+------------+------------+ Left   1.02       1.03                                +-------+-----------+-----------+------------+------------+          Summary: Right: Resting right ankle-brachial index is within normal range. The right toe-brachial index is normal.  Left: Resting left ankle-brachial index is within normal range. The left toe-brachial index is normal.     Assessment:   1. Pain due to onychomycosis of toenails of both feet   2. Coagulation defect   3. Type 2 diabetes, diet controlled       Plan:  Patient was evaluated and treated and all questions answered. -Discussed and educated patient on diabetic foot care, especially with  regards to the vascular, neurological and musculoskeletal systems.  -Stressed the importance of good glycemic control and the detriment of not  controlling glucose levels in relation to  the foot. -Discussed supportive shoes at all times and checking feet regularly.  -Mechanically debrided all nails 1-5 bilateral using sterile nail nipper and filed with dremel without incident  -ABIS reviewed and normal.  -Answered all patient questions -Patient  to return  in 3 months for at risk foot care -Patient advised to call the office if any problems or questions arise in the meantime.   Louann Sjogren, DPM

## 2023-03-25 DIAGNOSIS — J449 Chronic obstructive pulmonary disease, unspecified: Secondary | ICD-10-CM | POA: Diagnosis not present

## 2023-03-25 DIAGNOSIS — I5032 Chronic diastolic (congestive) heart failure: Secondary | ICD-10-CM | POA: Diagnosis not present

## 2023-03-25 DIAGNOSIS — I110 Hypertensive heart disease with heart failure: Secondary | ICD-10-CM | POA: Diagnosis not present

## 2023-03-25 DIAGNOSIS — I2510 Atherosclerotic heart disease of native coronary artery without angina pectoris: Secondary | ICD-10-CM | POA: Diagnosis not present

## 2023-03-25 IMAGING — CR DG CHEST 2V
1 series · 2 of 2 positions shown · non-contrast
Comparison: Chest radiograph 07/07/2009

CLINICAL DATA: Shortness of breath

EXAM:
CHEST - 2 VIEW

[Series 1: w chest pa · 0.14mm/px · 2 of 2 slices shown]
[im 1/2]
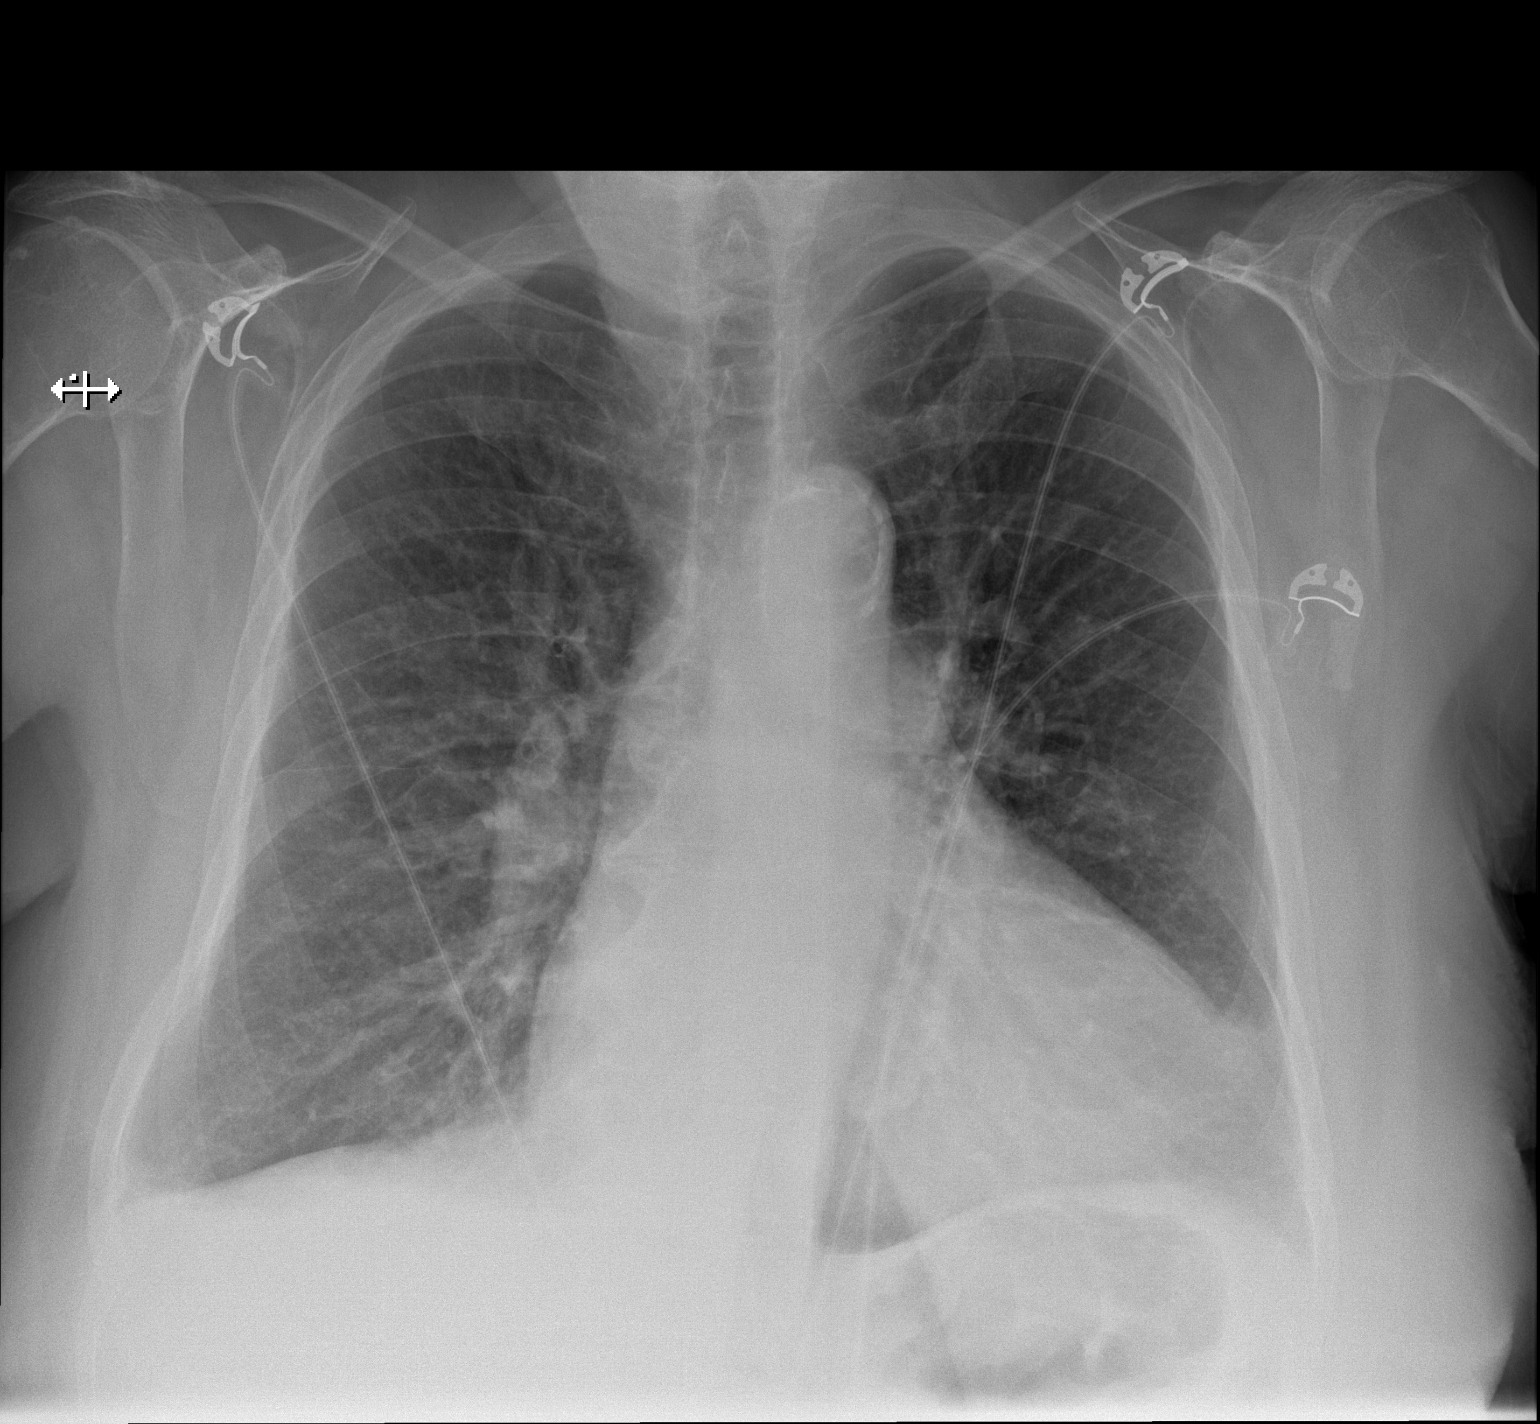
[im 2/2]
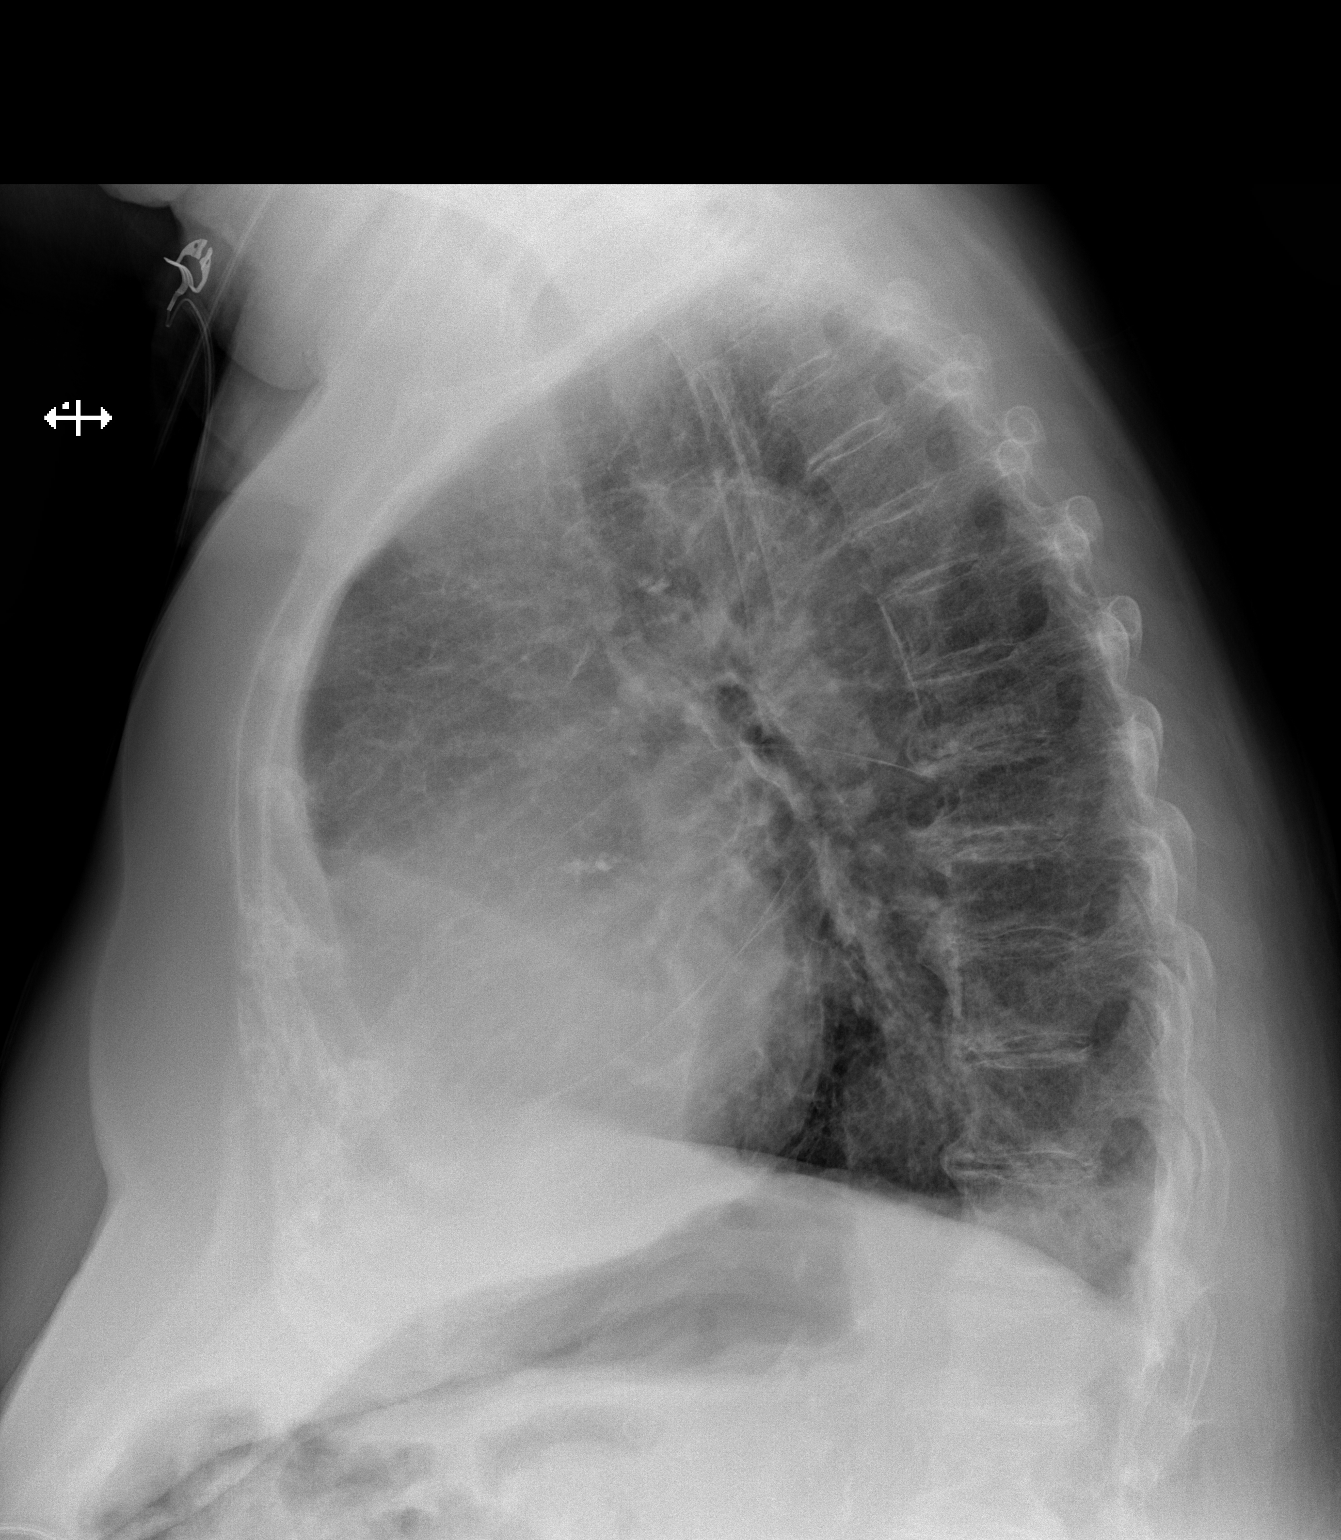

[2 of 2 positions shown; findings below may reference images not displayed]

FINDINGS: The heart is enlarged, progressed since 0484. There is calcified
atherosclerotic plaque of the aortic arch. The mediastinal contours
are within normal limits.

There is vascular congestion without overt pulmonary edema. There is
a trace right pleural effusion with adjacent atelectasis. Otherwise,
there is no focal consolidation. There is no left pleural effusion.
There is no pneumothorax.

There is no acute osseous abnormality.
IMPRESSION: 1. Cardiomegaly, progressed since [DATE], with vascular congestion
but no overt pulmonary edema.
2. Trace right pleural effusion with adjacent atelectasis.

## 2023-03-27 DIAGNOSIS — W5503XA Scratched by cat, initial encounter: Secondary | ICD-10-CM | POA: Diagnosis not present

## 2023-03-27 DIAGNOSIS — I5030 Unspecified diastolic (congestive) heart failure: Secondary | ICD-10-CM | POA: Diagnosis not present

## 2023-03-27 DIAGNOSIS — J449 Chronic obstructive pulmonary disease, unspecified: Secondary | ICD-10-CM | POA: Diagnosis not present

## 2023-03-27 DIAGNOSIS — R2681 Unsteadiness on feet: Secondary | ICD-10-CM | POA: Diagnosis not present

## 2023-03-31 DIAGNOSIS — J449 Chronic obstructive pulmonary disease, unspecified: Secondary | ICD-10-CM | POA: Diagnosis not present

## 2023-03-31 DIAGNOSIS — I4819 Other persistent atrial fibrillation: Secondary | ICD-10-CM | POA: Diagnosis not present

## 2023-03-31 DIAGNOSIS — E1122 Type 2 diabetes mellitus with diabetic chronic kidney disease: Secondary | ICD-10-CM | POA: Diagnosis not present

## 2023-03-31 DIAGNOSIS — N1832 Chronic kidney disease, stage 3b: Secondary | ICD-10-CM | POA: Diagnosis not present

## 2023-03-31 DIAGNOSIS — Z7951 Long term (current) use of inhaled steroids: Secondary | ICD-10-CM | POA: Diagnosis not present

## 2023-03-31 DIAGNOSIS — I5030 Unspecified diastolic (congestive) heart failure: Secondary | ICD-10-CM | POA: Diagnosis not present

## 2023-03-31 DIAGNOSIS — K219 Gastro-esophageal reflux disease without esophagitis: Secondary | ICD-10-CM | POA: Diagnosis not present

## 2023-03-31 DIAGNOSIS — M8580 Other specified disorders of bone density and structure, unspecified site: Secondary | ICD-10-CM | POA: Diagnosis not present

## 2023-03-31 DIAGNOSIS — H409 Unspecified glaucoma: Secondary | ICD-10-CM | POA: Diagnosis not present

## 2023-03-31 DIAGNOSIS — E1142 Type 2 diabetes mellitus with diabetic polyneuropathy: Secondary | ICD-10-CM | POA: Diagnosis not present

## 2023-03-31 DIAGNOSIS — H54414A Blindness right eye category 4, normal vision left eye: Secondary | ICD-10-CM | POA: Diagnosis not present

## 2023-03-31 DIAGNOSIS — E785 Hyperlipidemia, unspecified: Secondary | ICD-10-CM | POA: Diagnosis not present

## 2023-03-31 DIAGNOSIS — J309 Allergic rhinitis, unspecified: Secondary | ICD-10-CM | POA: Diagnosis not present

## 2023-03-31 DIAGNOSIS — Z8673 Personal history of transient ischemic attack (TIA), and cerebral infarction without residual deficits: Secondary | ICD-10-CM | POA: Diagnosis not present

## 2023-03-31 DIAGNOSIS — Z6835 Body mass index (BMI) 35.0-35.9, adult: Secondary | ICD-10-CM | POA: Diagnosis not present

## 2023-03-31 DIAGNOSIS — F331 Major depressive disorder, recurrent, moderate: Secondary | ICD-10-CM | POA: Diagnosis not present

## 2023-03-31 DIAGNOSIS — F17200 Nicotine dependence, unspecified, uncomplicated: Secondary | ICD-10-CM | POA: Diagnosis not present

## 2023-03-31 DIAGNOSIS — E1151 Type 2 diabetes mellitus with diabetic peripheral angiopathy without gangrene: Secondary | ICD-10-CM | POA: Diagnosis not present

## 2023-03-31 DIAGNOSIS — I130 Hypertensive heart and chronic kidney disease with heart failure and stage 1 through stage 4 chronic kidney disease, or unspecified chronic kidney disease: Secondary | ICD-10-CM | POA: Diagnosis not present

## 2023-03-31 DIAGNOSIS — I872 Venous insufficiency (chronic) (peripheral): Secondary | ICD-10-CM | POA: Diagnosis not present

## 2023-03-31 DIAGNOSIS — D631 Anemia in chronic kidney disease: Secondary | ICD-10-CM | POA: Diagnosis not present

## 2023-03-31 DIAGNOSIS — I2510 Atherosclerotic heart disease of native coronary artery without angina pectoris: Secondary | ICD-10-CM | POA: Diagnosis not present

## 2023-03-31 DIAGNOSIS — Z7901 Long term (current) use of anticoagulants: Secondary | ICD-10-CM | POA: Diagnosis not present

## 2023-04-08 DIAGNOSIS — I5032 Chronic diastolic (congestive) heart failure: Secondary | ICD-10-CM | POA: Diagnosis not present

## 2023-04-08 DIAGNOSIS — D649 Anemia, unspecified: Secondary | ICD-10-CM | POA: Diagnosis not present

## 2023-04-08 DIAGNOSIS — E1142 Type 2 diabetes mellitus with diabetic polyneuropathy: Secondary | ICD-10-CM | POA: Diagnosis not present

## 2023-04-08 DIAGNOSIS — R54 Age-related physical debility: Secondary | ICD-10-CM | POA: Diagnosis not present

## 2023-04-08 DIAGNOSIS — J449 Chronic obstructive pulmonary disease, unspecified: Secondary | ICD-10-CM | POA: Diagnosis not present

## 2023-04-08 DIAGNOSIS — E785 Hyperlipidemia, unspecified: Secondary | ICD-10-CM | POA: Diagnosis not present

## 2023-04-08 DIAGNOSIS — F331 Major depressive disorder, recurrent, moderate: Secondary | ICD-10-CM | POA: Diagnosis not present

## 2023-04-08 DIAGNOSIS — I110 Hypertensive heart disease with heart failure: Secondary | ICD-10-CM | POA: Diagnosis not present

## 2023-04-08 DIAGNOSIS — I130 Hypertensive heart and chronic kidney disease with heart failure and stage 1 through stage 4 chronic kidney disease, or unspecified chronic kidney disease: Secondary | ICD-10-CM | POA: Diagnosis not present

## 2023-04-08 DIAGNOSIS — I5030 Unspecified diastolic (congestive) heart failure: Secondary | ICD-10-CM | POA: Diagnosis not present

## 2023-04-08 DIAGNOSIS — I2510 Atherosclerotic heart disease of native coronary artery without angina pectoris: Secondary | ICD-10-CM | POA: Diagnosis not present

## 2023-04-08 DIAGNOSIS — I872 Venous insufficiency (chronic) (peripheral): Secondary | ICD-10-CM | POA: Diagnosis not present

## 2023-04-08 DIAGNOSIS — E1151 Type 2 diabetes mellitus with diabetic peripheral angiopathy without gangrene: Secondary | ICD-10-CM | POA: Diagnosis not present

## 2023-04-09 ENCOUNTER — Ambulatory Visit
Admission: RE | Admit: 2023-04-09 | Discharge: 2023-04-09 | Disposition: A | Discharge status: Home / Self Care | Payer: Medicare Other | Source: Ambulatory Visit | Attending: Family | Admitting: Family

## 2023-04-09 DIAGNOSIS — I2510 Atherosclerotic heart disease of native coronary artery without angina pectoris: Secondary | ICD-10-CM | POA: Insufficient documentation

## 2023-04-09 DIAGNOSIS — I110 Hypertensive heart disease with heart failure: Secondary | ICD-10-CM | POA: Diagnosis not present

## 2023-04-09 DIAGNOSIS — J449 Chronic obstructive pulmonary disease, unspecified: Secondary | ICD-10-CM | POA: Diagnosis not present

## 2023-04-09 DIAGNOSIS — I5032 Chronic diastolic (congestive) heart failure: Secondary | ICD-10-CM | POA: Diagnosis not present

## 2023-04-09 DIAGNOSIS — E1165 Type 2 diabetes mellitus with hyperglycemia: Secondary | ICD-10-CM | POA: Insufficient documentation

## 2023-04-09 DIAGNOSIS — I252 Old myocardial infarction: Secondary | ICD-10-CM | POA: Diagnosis not present

## 2023-04-09 DIAGNOSIS — E785 Hyperlipidemia, unspecified: Secondary | ICD-10-CM | POA: Diagnosis not present

## 2023-04-09 DIAGNOSIS — I272 Pulmonary hypertension, unspecified: Secondary | ICD-10-CM | POA: Insufficient documentation

## 2023-04-09 DIAGNOSIS — F17200 Nicotine dependence, unspecified, uncomplicated: Secondary | ICD-10-CM | POA: Diagnosis not present

## 2023-04-09 LAB — ECHOCARDIOGRAM COMPLETE
AR max vel: 2.68 cm2
AV Area VTI: 2.57 cm2
AV Area mean vel: 2.44 cm2
AV Mean grad: 7 mmHg
AV Peak grad: 12.4 mmHg
Ao pk vel: 1.76 m/s
Area-P 1/2: 1.82 cm2
MV VTI: 1.54 cm2
S' Lateral: 2.7 cm

## 2023-04-09 NOTE — Progress Notes (Signed)
*  PRELIMINARY RESULTS* Echocardiogram 2D Echocardiogram has been performed.  Jenny [name] 04/09/2023, 10:57 AM

## 2023-04-15 DIAGNOSIS — I130 Hypertensive heart and chronic kidney disease with heart failure and stage 1 through stage 4 chronic kidney disease, or unspecified chronic kidney disease: Secondary | ICD-10-CM | POA: Diagnosis not present

## 2023-04-15 DIAGNOSIS — F331 Major depressive disorder, recurrent, moderate: Secondary | ICD-10-CM | POA: Diagnosis not present

## 2023-04-15 DIAGNOSIS — I5030 Unspecified diastolic (congestive) heart failure: Secondary | ICD-10-CM | POA: Diagnosis not present

## 2023-04-15 DIAGNOSIS — J449 Chronic obstructive pulmonary disease, unspecified: Secondary | ICD-10-CM | POA: Diagnosis not present

## 2023-04-15 DIAGNOSIS — E1151 Type 2 diabetes mellitus with diabetic peripheral angiopathy without gangrene: Secondary | ICD-10-CM | POA: Diagnosis not present

## 2023-04-15 DIAGNOSIS — I872 Venous insufficiency (chronic) (peripheral): Secondary | ICD-10-CM | POA: Diagnosis not present

## 2023-04-18 DIAGNOSIS — R54 Age-related physical debility: Secondary | ICD-10-CM | POA: Diagnosis not present

## 2023-04-18 DIAGNOSIS — E1122 Type 2 diabetes mellitus with diabetic chronic kidney disease: Secondary | ICD-10-CM | POA: Diagnosis not present

## 2023-04-18 DIAGNOSIS — E1142 Type 2 diabetes mellitus with diabetic polyneuropathy: Secondary | ICD-10-CM | POA: Diagnosis not present

## 2023-04-18 DIAGNOSIS — N184 Chronic kidney disease, stage 4 (severe): Secondary | ICD-10-CM | POA: Diagnosis not present

## 2023-04-18 DIAGNOSIS — J449 Chronic obstructive pulmonary disease, unspecified: Secondary | ICD-10-CM | POA: Diagnosis not present

## 2023-04-18 DIAGNOSIS — Z87891 Personal history of nicotine dependence: Secondary | ICD-10-CM | POA: Diagnosis not present

## 2023-04-23 ENCOUNTER — Encounter: Payer: Medicare Other | Admitting: Family

## 2023-04-23 ENCOUNTER — Telehealth: Payer: Self-pay

## 2023-04-23 NOTE — Telephone Encounter (Signed)
Patient's son Jenny [name] called this morning and canceled patient appointment due to the patient falling. Stated they will call to reschedule.

## 2023-04-24 DIAGNOSIS — I130 Hypertensive heart and chronic kidney disease with heart failure and stage 1 through stage 4 chronic kidney disease, or unspecified chronic kidney disease: Secondary | ICD-10-CM | POA: Diagnosis not present

## 2023-04-24 DIAGNOSIS — I5030 Unspecified diastolic (congestive) heart failure: Secondary | ICD-10-CM | POA: Diagnosis not present

## 2023-04-24 DIAGNOSIS — J449 Chronic obstructive pulmonary disease, unspecified: Secondary | ICD-10-CM | POA: Diagnosis not present

## 2023-04-24 DIAGNOSIS — E1151 Type 2 diabetes mellitus with diabetic peripheral angiopathy without gangrene: Secondary | ICD-10-CM | POA: Diagnosis not present

## 2023-04-24 DIAGNOSIS — I872 Venous insufficiency (chronic) (peripheral): Secondary | ICD-10-CM | POA: Diagnosis not present

## 2023-04-24 DIAGNOSIS — F331 Major depressive disorder, recurrent, moderate: Secondary | ICD-10-CM | POA: Diagnosis not present

## 2023-04-27 DIAGNOSIS — F331 Major depressive disorder, recurrent, moderate: Secondary | ICD-10-CM | POA: Diagnosis not present

## 2023-04-27 DIAGNOSIS — I872 Venous insufficiency (chronic) (peripheral): Secondary | ICD-10-CM | POA: Diagnosis not present

## 2023-04-27 DIAGNOSIS — J449 Chronic obstructive pulmonary disease, unspecified: Secondary | ICD-10-CM | POA: Diagnosis not present

## 2023-04-27 DIAGNOSIS — I5030 Unspecified diastolic (congestive) heart failure: Secondary | ICD-10-CM | POA: Diagnosis not present

## 2023-04-27 DIAGNOSIS — I130 Hypertensive heart and chronic kidney disease with heart failure and stage 1 through stage 4 chronic kidney disease, or unspecified chronic kidney disease: Secondary | ICD-10-CM | POA: Diagnosis not present

## 2023-04-27 DIAGNOSIS — E1151 Type 2 diabetes mellitus with diabetic peripheral angiopathy without gangrene: Secondary | ICD-10-CM | POA: Diagnosis not present

## 2023-04-29 DIAGNOSIS — R54 Age-related physical debility: Secondary | ICD-10-CM | POA: Diagnosis not present

## 2023-04-29 DIAGNOSIS — E1142 Type 2 diabetes mellitus with diabetic polyneuropathy: Secondary | ICD-10-CM | POA: Diagnosis not present

## 2023-04-29 DIAGNOSIS — D649 Anemia, unspecified: Secondary | ICD-10-CM | POA: Diagnosis not present

## 2023-04-29 DIAGNOSIS — I130 Hypertensive heart and chronic kidney disease with heart failure and stage 1 through stage 4 chronic kidney disease, or unspecified chronic kidney disease: Secondary | ICD-10-CM | POA: Diagnosis not present

## 2023-04-29 DIAGNOSIS — I5030 Unspecified diastolic (congestive) heart failure: Secondary | ICD-10-CM | POA: Diagnosis not present

## 2023-04-29 DIAGNOSIS — E1122 Type 2 diabetes mellitus with diabetic chronic kidney disease: Secondary | ICD-10-CM | POA: Diagnosis not present

## 2023-04-29 DIAGNOSIS — F331 Major depressive disorder, recurrent, moderate: Secondary | ICD-10-CM | POA: Diagnosis not present

## 2023-04-29 DIAGNOSIS — H409 Unspecified glaucoma: Secondary | ICD-10-CM | POA: Diagnosis not present

## 2023-04-29 DIAGNOSIS — N184 Chronic kidney disease, stage 4 (severe): Secondary | ICD-10-CM | POA: Diagnosis not present

## 2023-04-29 DIAGNOSIS — J449 Chronic obstructive pulmonary disease, unspecified: Secondary | ICD-10-CM | POA: Diagnosis not present

## 2023-04-29 DIAGNOSIS — E785 Hyperlipidemia, unspecified: Secondary | ICD-10-CM | POA: Diagnosis not present

## 2023-04-30 DIAGNOSIS — J309 Allergic rhinitis, unspecified: Secondary | ICD-10-CM | POA: Diagnosis not present

## 2023-04-30 DIAGNOSIS — N1832 Chronic kidney disease, stage 3b: Secondary | ICD-10-CM | POA: Diagnosis not present

## 2023-04-30 DIAGNOSIS — D631 Anemia in chronic kidney disease: Secondary | ICD-10-CM | POA: Diagnosis not present

## 2023-04-30 DIAGNOSIS — H54414A Blindness right eye category 4, normal vision left eye: Secondary | ICD-10-CM | POA: Diagnosis not present

## 2023-04-30 DIAGNOSIS — E785 Hyperlipidemia, unspecified: Secondary | ICD-10-CM | POA: Diagnosis not present

## 2023-04-30 DIAGNOSIS — Z7951 Long term (current) use of inhaled steroids: Secondary | ICD-10-CM | POA: Diagnosis not present

## 2023-04-30 DIAGNOSIS — E1142 Type 2 diabetes mellitus with diabetic polyneuropathy: Secondary | ICD-10-CM | POA: Diagnosis not present

## 2023-04-30 DIAGNOSIS — H409 Unspecified glaucoma: Secondary | ICD-10-CM | POA: Diagnosis not present

## 2023-04-30 DIAGNOSIS — E1122 Type 2 diabetes mellitus with diabetic chronic kidney disease: Secondary | ICD-10-CM | POA: Diagnosis not present

## 2023-04-30 DIAGNOSIS — F17200 Nicotine dependence, unspecified, uncomplicated: Secondary | ICD-10-CM | POA: Diagnosis not present

## 2023-04-30 DIAGNOSIS — Z6835 Body mass index (BMI) 35.0-35.9, adult: Secondary | ICD-10-CM | POA: Diagnosis not present

## 2023-04-30 DIAGNOSIS — I4819 Other persistent atrial fibrillation: Secondary | ICD-10-CM | POA: Diagnosis not present

## 2023-04-30 DIAGNOSIS — E1151 Type 2 diabetes mellitus with diabetic peripheral angiopathy without gangrene: Secondary | ICD-10-CM | POA: Diagnosis not present

## 2023-04-30 DIAGNOSIS — I2510 Atherosclerotic heart disease of native coronary artery without angina pectoris: Secondary | ICD-10-CM | POA: Diagnosis not present

## 2023-04-30 DIAGNOSIS — M8580 Other specified disorders of bone density and structure, unspecified site: Secondary | ICD-10-CM | POA: Diagnosis not present

## 2023-04-30 DIAGNOSIS — Z8673 Personal history of transient ischemic attack (TIA), and cerebral infarction without residual deficits: Secondary | ICD-10-CM | POA: Diagnosis not present

## 2023-04-30 DIAGNOSIS — F331 Major depressive disorder, recurrent, moderate: Secondary | ICD-10-CM | POA: Diagnosis not present

## 2023-04-30 DIAGNOSIS — I5030 Unspecified diastolic (congestive) heart failure: Secondary | ICD-10-CM | POA: Diagnosis not present

## 2023-04-30 DIAGNOSIS — Z7901 Long term (current) use of anticoagulants: Secondary | ICD-10-CM | POA: Diagnosis not present

## 2023-04-30 DIAGNOSIS — I872 Venous insufficiency (chronic) (peripheral): Secondary | ICD-10-CM | POA: Diagnosis not present

## 2023-04-30 DIAGNOSIS — J449 Chronic obstructive pulmonary disease, unspecified: Secondary | ICD-10-CM | POA: Diagnosis not present

## 2023-04-30 DIAGNOSIS — K219 Gastro-esophageal reflux disease without esophagitis: Secondary | ICD-10-CM | POA: Diagnosis not present

## 2023-04-30 DIAGNOSIS — I130 Hypertensive heart and chronic kidney disease with heart failure and stage 1 through stage 4 chronic kidney disease, or unspecified chronic kidney disease: Secondary | ICD-10-CM | POA: Diagnosis not present

## 2023-05-07 DIAGNOSIS — J449 Chronic obstructive pulmonary disease, unspecified: Secondary | ICD-10-CM | POA: Diagnosis not present

## 2023-05-07 DIAGNOSIS — E1151 Type 2 diabetes mellitus with diabetic peripheral angiopathy without gangrene: Secondary | ICD-10-CM | POA: Diagnosis not present

## 2023-05-07 DIAGNOSIS — F331 Major depressive disorder, recurrent, moderate: Secondary | ICD-10-CM | POA: Diagnosis not present

## 2023-05-07 DIAGNOSIS — I5030 Unspecified diastolic (congestive) heart failure: Secondary | ICD-10-CM | POA: Diagnosis not present

## 2023-05-07 DIAGNOSIS — I872 Venous insufficiency (chronic) (peripheral): Secondary | ICD-10-CM | POA: Diagnosis not present

## 2023-05-07 DIAGNOSIS — I130 Hypertensive heart and chronic kidney disease with heart failure and stage 1 through stage 4 chronic kidney disease, or unspecified chronic kidney disease: Secondary | ICD-10-CM | POA: Diagnosis not present

## 2023-05-09 ENCOUNTER — Encounter: Payer: Self-pay | Admitting: Family

## 2023-05-09 ENCOUNTER — Ambulatory Visit: Payer: Medicare Other | Attending: Family | Admitting: Family

## 2023-05-09 VITALS — BP 152/63 | HR 45 | Wt 192.2 lb

## 2023-05-09 DIAGNOSIS — Z7901 Long term (current) use of anticoagulants: Secondary | ICD-10-CM | POA: Insufficient documentation

## 2023-05-09 DIAGNOSIS — I428 Other cardiomyopathies: Secondary | ICD-10-CM | POA: Insufficient documentation

## 2023-05-09 DIAGNOSIS — I4819 Other persistent atrial fibrillation: Secondary | ICD-10-CM | POA: Insufficient documentation

## 2023-05-09 DIAGNOSIS — I480 Paroxysmal atrial fibrillation: Secondary | ICD-10-CM

## 2023-05-09 DIAGNOSIS — I5032 Chronic diastolic (congestive) heart failure: Secondary | ICD-10-CM

## 2023-05-09 DIAGNOSIS — I2510 Atherosclerotic heart disease of native coronary artery without angina pectoris: Secondary | ICD-10-CM | POA: Diagnosis not present

## 2023-05-09 DIAGNOSIS — Z87891 Personal history of nicotine dependence: Secondary | ICD-10-CM | POA: Insufficient documentation

## 2023-05-09 DIAGNOSIS — B372 Candidiasis of skin and nail: Secondary | ICD-10-CM | POA: Insufficient documentation

## 2023-05-09 DIAGNOSIS — J449 Chronic obstructive pulmonary disease, unspecified: Secondary | ICD-10-CM

## 2023-05-09 DIAGNOSIS — I110 Hypertensive heart disease with heart failure: Secondary | ICD-10-CM | POA: Insufficient documentation

## 2023-05-09 DIAGNOSIS — I509 Heart failure, unspecified: Secondary | ICD-10-CM | POA: Insufficient documentation

## 2023-05-09 DIAGNOSIS — E785 Hyperlipidemia, unspecified: Secondary | ICD-10-CM | POA: Diagnosis not present

## 2023-05-09 DIAGNOSIS — I10 Essential (primary) hypertension: Secondary | ICD-10-CM | POA: Diagnosis not present

## 2023-05-09 DIAGNOSIS — H409 Unspecified glaucoma: Secondary | ICD-10-CM | POA: Diagnosis not present

## 2023-05-09 NOTE — Progress Notes (Addendum)
[facility] HEART FAILURE CLINIC - Pharmacist Note  Jenny [name] is a 89 y.o. female with HFpEF (EF >50%) presenting to the Heart Failure Clinic for follow up. She is here today with her son. 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 She reports no new medication access issues. Per ger son, they are working on Eliquis PAP. He states that she has not yet met the required 3% out-of-pocket spending to be eligible for Eliquis PAP. He reports that he is working with our patient advocate to submit this information once the patient meets the required out-of-pocket spending. Patient reports checking her weight 2-3x weekly and that she is losing weight. She would like to continue losing weight to achieve a goal weight of 150 lbs. She states that her appetite has decreased since having COVID about a year ago. She reports no signs or symptoms of volume overload today. She reports ongoing issues with thrush under her breasts and at her groin. She suspects that one of her medications is the culprit. She reports adequate oral hygiene after using her Trellegy.   Recent ED Visit (past 6 months): none  Guideline-Directed Medical Therapy/Evidence Based Medicine ACE/ARB/ARNI: none Beta Blocker:  none Aldosterone Antagonist: none Diuretic: Torsemide 20 mg daily + 20 mg PRN SGLT2i:  none  Adherence Assessment Do you ever forget to take your medication? [] Yes [x] No  Do you ever skip doses due to side effects? [] Yes [x] No  Do you have trouble affording your medicines? [] Yes [x] No  Are you ever unable to pick up your medication due to transportation difficulties? [] Yes [x] No  Do you ever stop taking your medications because you don't believe they are helping? [] Yes [x] No  Do you check your weight daily? [] Yes [x] No (2-3x weekly)  Adherence strategy: none reported Barriers to obtaining medications: cost of Eliquis, Latanoprost, and Trellegy  Diagnostics ECHO: Date 04/09/2023, EF 65-70%, no RWMA, G2DD  Vitals     05/09/2023    9:22 AM 03/11/2023    9:59 AM 02/04/2023    9:50 AM  Vitals with BMI  Height   5\' 2"   Weight 192 lbs 3 oz 200 lbs 202 lbs  BMI   36.94  Systolic 152 144 956  Diastolic 63 42 59  Pulse 45 47 42     Recent Labs    Latest Ref Rng & Units 03/11/2023   10:54 AM 02/04/2023   10:51 AM 01/30/2022    9:07 AM  BMP  Glucose 70 - 99 mg/dL 213  086  578   BUN 8 - 23 mg/dL 46  48  41   Creatinine 0.44 - 1.00 mg/dL 4.69  6.29  5.28   Sodium 135 - 145 mmol/L 138  139  135   Potassium 3.5 - 5.1 mmol/L 3.9  3.3  4.0   Chloride 98 - 111 mmol/L 106  98  99   CO2 22 - 32 mmol/L 21  28  24    Calcium 8.9 - 10.3 mg/dL 7.8  8.1  8.6     Past Medical History Past Medical History:  Diagnosis Date   ACUT DUOD ULCER W/HEMORR&PERF W/O MENTION OBST 10/05/2009   NSAID induced   ALLERGIC RHINITIS CAUSE UNSPECIFIED    ANEMIA-NOS    CAD (coronary artery disease) 06/08/2009   DEs RCA with 70% LAD and EF 60%   CHF (congestive heart failure) (HCC)    COPD    mild obst on PFTs 03/2010   Diabetes mellitus 06/2010 dx   Mild, diet  controlled   GLAUCOMA    HYPERLIPIDEMIA    HYPERTENSION, BENIGN    MYOCARDIAL INFARCTION 06/08/2009   des to rca   Persistent atrial fibrillation (HCC)    Dx 08/2021   TOBACCO ABUSE     Plan Continue regimen as directed by NP Consider adding RAAS inhibitor or spironolactone for BP control Patient is a poor candidate for SGLT2 inhibitor given ongoing issues with GU infection Annual echo due 04/2024  Time spent: 15 minutes  Celene Squibb, PharmD PGY1 Pharmacy Resident 05/09/2023 9:32 AM

## 2023-05-09 NOTE — Progress Notes (Signed)
PCP: seeing [facility] every 2 weeks Primary Cardiologist: Melene Muller, MD (last seen 02/24)  HPI:  Jenny [name] is a 89 y/o female with a history of CAD, hyperlipidemia, HTN, anemia, COPD, glaucoma, atrial fibrillation, recent tobacco use and chronic heart failure.   Echo 08/22/21: EF of 60-65% along with mild LVH/ LAE, mild MR and severely elevated PA pressure of 65.1 mmHg.  Echo 04/09/23: EF 65-70% along with Grade II DD, severely elevated PA pressure, mild LAE, moderate RAE and mild/ moderate TR.  Vaginal and breast yeast   Stress test 12/2013  Has not been admitted or been in the ED in the last 6 months. Cardioversion 01/30/22  She presents today for a HF follow-up visit with a chief complaint of minimal SOB with moderate exertion. Chronic in nature. Has associated fatigue, sense of being off balance and intermittent difficulty sleeping along with this. Denies chest pain, cough, palpitations, pedal edema or weight gain. Does have an area on her outer right lower leg that the cat scratched. Says that she's keeping it clean and that it isn't currently infected.    Fell 2 weeks ago going to the house after she got tangled with her walker and cats. Fell onto her butt and couldn't get up. Son had to come and get her up, denied hitting her head.   Has been battling yeast in her groin and under her breasts. Says that she just can't get rid of it. Plans to see if she can find some catnip at [facility] store and make catnip tea like she did when she was younger.  Currently receiving physical therapy.    ROS: All systems negative except as listed in HPI, PMH and Problem List.  SH:  Social History   Socioeconomic History   Marital status: Widowed    Spouse name: Not on file   Number of children: Not on file   Years of education: Not on file   Highest education level: Not on file  Occupational History   Not on file  Tobacco Use   Smoking status: Former    Packs/day: 0.50    Years:  60.00    Additional pack years: 0.00    Total pack years: 30.00    Types: Cigarettes    Quit date: 11/12/2021    Years since quitting: 1.4   Smokeless tobacco: Never   Tobacco comments:    She lives in [place] with her significant other (Charles Hook)0  Vaping Use   Vaping Use: Never used  Substance and Sexual Activity   Alcohol use: Yes    Alcohol/week: 1.0 - 2.0 standard drink of alcohol    Types: 1 - 2 Glasses of wine per week    Comment: once or twice a year glass of wine 12/25/21   Drug use: No   Sexual activity: Not on file  Other Topics Concern   Not on file  Social History Narrative   Lives in [place] with her SO - charles hook   Retired -Acupuncturist   Enjoys travel - live Engineer, maintenance (IT)   Social Determinants of Corporate investment banker Strain: Not on Ship broker Insecurity: Not on file  Transportation Needs: Not on file  Physical Activity: Not on file  Stress: Not on file  Social Connections: Not on file  Intimate Partner Violence: Not on file    FH:  Family History  Problem Relation Age of Onset   Hypertension Mother    Ulcers Mother  Stomach cancer Father    Stroke Maternal Grandmother    Heart attack Neg Hx     Past Medical History:  Diagnosis Date   ACUT DUOD ULCER W/HEMORR&PERF W/O MENTION OBST 10/05/2009   NSAID induced   ALLERGIC RHINITIS CAUSE UNSPECIFIED    ANEMIA-NOS    CAD (coronary artery disease) 06/08/2009   DEs RCA with 70% LAD and EF 60%   CHF (congestive heart failure) (HCC)    COPD    mild obst on PFTs 03/2010   Diabetes mellitus 06/2010 dx   Mild, diet controlled   GLAUCOMA    HYPERLIPIDEMIA    HYPERTENSION, BENIGN    MYOCARDIAL INFARCTION 06/08/2009   des to rca   Persistent atrial fibrillation (HCC)    Dx 08/2021   TOBACCO ABUSE     Current Outpatient Medications  Medication Sig Dispense Refill   acetaminophen (TYLENOL) 500 MG tablet Take 500 mg by mouth every 6 (six) hours as needed. Takes two tablets in  the morning and two tablets at night     albuterol (VENTOLIN HFA) 108 (90 Base) MCG/ACT inhaler INHALE ONE PUFF EVERY 6 HOURS AS NEEDED FOR SHORTNESS OF BREATH 18 g 2   apixaban (ELIQUIS) 5 MG TABS tablet TAKE ONE TABLET TWICE DAILY 180 tablet 1   dorzolamide (TRUSOPT) 2 % ophthalmic solution Place 1 drop into both eyes 2 (two) times daily.     Fluticasone-Umeclidin-Vilant (TRELEGY ELLIPTA) 100-62.5-25 MCG/INH AEPB Inhale 1 puff into the lungs daily. 1 each 1   gabapentin (NEURONTIN) 100 MG capsule Take 100-200 mg by mouth See admin instructions. Take 1 capsule (100mg ) by mouth every morning and take 2 capsules (200mg ) by mouth at bedtime     hydrOXYzine (ATARAX) 25 MG tablet Take 25 mg by mouth every 8 (eight) hours as needed for itching.     ipratropium-albuterol (DUONEB) 0.5-2.5 (3) MG/3ML SOLN Take 3 mLs by nebulization every 6 (six) hours as needed. 360 mL 1   latanoprost (XALATAN) 0.005 % ophthalmic solution Place 1 drop into both eyes in the morning.     magnesium hydroxide (MILK OF MAGNESIA) 400 MG/5ML suspension Take 30 mLs by mouth as needed for mild constipation.     NITROSTAT 0.4 MG SL tablet DISSOLVE ONE TABLET UNDER TONGUE AS NEEDED FOR CHEST PAIN - MAY REPEAT TWICE-IF NO RELIEF GO TO NEAREST HOSPITAL ER 25 tablet 0   nystatin (MYCOSTATIN/NYSTOP) powder Apply 1 application topically daily.     potassium chloride SA (KLOR-CON M) 20 MEQ tablet Take 1 tablet (20 mEq total) by mouth 2 (two) times daily. 60 tablet 3   predniSONE (DELTASONE) 10 MG tablet Take 1 tablet by mouth daily.     roflumilast (DALIRESP) 500 MCG TABS tablet Take 500 mcg by mouth daily.     rosuvastatin (CRESTOR) 20 MG tablet Take 20 mg by mouth daily.     torsemide (DEMADEX) 20 MG tablet Take 2 tablets (40 mg total) by mouth 2 (two) times daily. 120 tablet 5   No current facility-administered medications for this visit.   Vitals:   05/09/23 0922  BP: (!) 152/63  Pulse: (!) 45  SpO2: 100%  Weight: 192 lb 3.2 oz  (87.2 kg)   Wt Readings from Last 3 Encounters:  05/09/23 192 lb 3.2 oz (87.2 kg)  03/11/23 200 lb (90.7 kg)  02/04/23 202 lb (91.6 kg)   Lab Results  Component Value Date   CREATININE 1.55 (H) 03/11/2023   CREATININE 1.48 (H) 02/04/2023  CREATININE 1.24 (H) 01/30/2022    PHYSICAL EXAM:  General:  Well appearing. No resp difficulty HEENT: normal Neck: supple. JVP flat. No lymphadenopathy or thryomegaly appreciated. Cor: PMI normal. Regular rate & rhythm. No rubs, gallops or murmurs. Lungs: clear Abdomen: soft, nontender, nondistended. No hepatosplenomegaly. No bruits or masses.  Extremities: no cyanosis, clubbing, rash, edema Neuro: alert & orientedx3, cranial nerves grossly intact. Moves all 4 extremities w/o difficulty. Affect pleasant. Skin: right lateral lower leg wrapped with bandage and kerlex  ECG: not done   ASSESSMENT & PLAN:  1: NICM with preserved ejection fraction with LVH/LAE- - likely due to AF - NYHA class II - euvolemic today - weighing daily; reminded to call for an overnight weight gain of > 2 pounds or a weekly weight gain of > 5 pounds - weight down 8 pounds from last visit here 6 weeks ago - Echo 08/22/21: EF of 60-65% along with mild LVH/ LAE, mild MR and severely elevated PA pressure of 65.1 mmHg. - Echo 04/09/23: EF 65-70% along with Grade II DD, severely elevated PA pressure, mild LAE, moderate RAE and mild/ moderate TR.  - reviewed echo results with patient - not adding salt to her food and tries to eat low sodium foods  - continue torsemide 20mg  daily with additional 20mg  PRN - continue potassium PRN - SOB worsened when taking entresto - currently having yeast in groin/ under breasts so not a good candidate for SGLT2 - saw cardiology Sanjuana Kava) 02/24 - BNP 12/07/21 was 371.3 - PharmD reconciled meds w/ patient  2: HTN- - BP 152/63 - seeing [facility] for primary care every 2 weeks - BMP 03/11/23 reviewed and showed sodium 138, potassium  3.9, creatinine 1.55 and GFR 32 - saw nephrology Suezanne Jacquet) 03/23  3: Persistent atrial fibrillation- - saw EP Graciela Husbands) 04/23 - continue apixaban 5mg  BID - had cardioversion 01/31/22 & post cardioversion, developed Mobitz 2nd degree AV block  4: COPD- - stopped smoking fall 2022 - using trelegy daily and nebulizer PRN - saw pulmonology Karna Christmas) 03/24 - continue roflumilast daily - PFTs: 03/03/23 - FEV1 - 58%  - receiving home PT for deconditioning  Return in 3 months, sooner if needed.

## 2023-05-13 DIAGNOSIS — E1151 Type 2 diabetes mellitus with diabetic peripheral angiopathy without gangrene: Secondary | ICD-10-CM | POA: Diagnosis not present

## 2023-05-13 DIAGNOSIS — I130 Hypertensive heart and chronic kidney disease with heart failure and stage 1 through stage 4 chronic kidney disease, or unspecified chronic kidney disease: Secondary | ICD-10-CM | POA: Diagnosis not present

## 2023-05-13 DIAGNOSIS — J449 Chronic obstructive pulmonary disease, unspecified: Secondary | ICD-10-CM | POA: Diagnosis not present

## 2023-05-13 DIAGNOSIS — F331 Major depressive disorder, recurrent, moderate: Secondary | ICD-10-CM | POA: Diagnosis not present

## 2023-05-13 DIAGNOSIS — I872 Venous insufficiency (chronic) (peripheral): Secondary | ICD-10-CM | POA: Diagnosis not present

## 2023-05-13 DIAGNOSIS — I5030 Unspecified diastolic (congestive) heart failure: Secondary | ICD-10-CM | POA: Diagnosis not present

## 2023-05-16 DIAGNOSIS — E1169 Type 2 diabetes mellitus with other specified complication: Secondary | ICD-10-CM | POA: Diagnosis not present

## 2023-05-16 DIAGNOSIS — I5032 Chronic diastolic (congestive) heart failure: Secondary | ICD-10-CM | POA: Diagnosis not present

## 2023-05-16 DIAGNOSIS — W5503XA Scratched by cat, initial encounter: Secondary | ICD-10-CM | POA: Diagnosis not present

## 2023-05-16 DIAGNOSIS — D649 Anemia, unspecified: Secondary | ICD-10-CM | POA: Diagnosis not present

## 2023-05-16 DIAGNOSIS — R54 Age-related physical debility: Secondary | ICD-10-CM | POA: Diagnosis not present

## 2023-05-16 DIAGNOSIS — N1832 Chronic kidney disease, stage 3b: Secondary | ICD-10-CM | POA: Diagnosis not present

## 2023-05-16 DIAGNOSIS — I5030 Unspecified diastolic (congestive) heart failure: Secondary | ICD-10-CM | POA: Diagnosis not present

## 2023-05-16 DIAGNOSIS — S80819A Abrasion, unspecified lower leg, initial encounter: Secondary | ICD-10-CM | POA: Diagnosis not present

## 2023-05-16 DIAGNOSIS — J449 Chronic obstructive pulmonary disease, unspecified: Secondary | ICD-10-CM | POA: Diagnosis not present

## 2023-05-16 DIAGNOSIS — I110 Hypertensive heart disease with heart failure: Secondary | ICD-10-CM | POA: Diagnosis not present

## 2023-05-16 DIAGNOSIS — I2510 Atherosclerotic heart disease of native coronary artery without angina pectoris: Secondary | ICD-10-CM | POA: Diagnosis not present

## 2023-05-16 DIAGNOSIS — S50819A Abrasion of unspecified forearm, initial encounter: Secondary | ICD-10-CM | POA: Diagnosis not present

## 2023-05-16 DIAGNOSIS — E785 Hyperlipidemia, unspecified: Secondary | ICD-10-CM | POA: Diagnosis not present

## 2023-05-19 DIAGNOSIS — E1151 Type 2 diabetes mellitus with diabetic peripheral angiopathy without gangrene: Secondary | ICD-10-CM | POA: Diagnosis not present

## 2023-05-19 DIAGNOSIS — I5030 Unspecified diastolic (congestive) heart failure: Secondary | ICD-10-CM | POA: Diagnosis not present

## 2023-05-19 DIAGNOSIS — J449 Chronic obstructive pulmonary disease, unspecified: Secondary | ICD-10-CM | POA: Diagnosis not present

## 2023-05-19 DIAGNOSIS — I130 Hypertensive heart and chronic kidney disease with heart failure and stage 1 through stage 4 chronic kidney disease, or unspecified chronic kidney disease: Secondary | ICD-10-CM | POA: Diagnosis not present

## 2023-05-19 DIAGNOSIS — F331 Major depressive disorder, recurrent, moderate: Secondary | ICD-10-CM | POA: Diagnosis not present

## 2023-05-19 DIAGNOSIS — I872 Venous insufficiency (chronic) (peripheral): Secondary | ICD-10-CM | POA: Diagnosis not present

## 2023-05-24 DIAGNOSIS — J449 Chronic obstructive pulmonary disease, unspecified: Secondary | ICD-10-CM | POA: Diagnosis not present

## 2023-05-26 ENCOUNTER — Ambulatory Visit (INDEPENDENT_AMBULATORY_CARE_PROVIDER_SITE_OTHER): Payer: Medicare Other | Admitting: Podiatry

## 2023-05-26 DIAGNOSIS — M79675 Pain in left toe(s): Secondary | ICD-10-CM | POA: Diagnosis not present

## 2023-05-26 DIAGNOSIS — J449 Chronic obstructive pulmonary disease, unspecified: Secondary | ICD-10-CM | POA: Diagnosis not present

## 2023-05-26 DIAGNOSIS — M79674 Pain in right toe(s): Secondary | ICD-10-CM | POA: Diagnosis not present

## 2023-05-26 DIAGNOSIS — I872 Venous insufficiency (chronic) (peripheral): Secondary | ICD-10-CM | POA: Diagnosis not present

## 2023-05-26 DIAGNOSIS — E1151 Type 2 diabetes mellitus with diabetic peripheral angiopathy without gangrene: Secondary | ICD-10-CM

## 2023-05-26 DIAGNOSIS — F331 Major depressive disorder, recurrent, moderate: Secondary | ICD-10-CM | POA: Diagnosis not present

## 2023-05-26 DIAGNOSIS — B351 Tinea unguium: Secondary | ICD-10-CM | POA: Diagnosis not present

## 2023-05-26 DIAGNOSIS — I5030 Unspecified diastolic (congestive) heart failure: Secondary | ICD-10-CM | POA: Diagnosis not present

## 2023-05-26 DIAGNOSIS — I130 Hypertensive heart and chronic kidney disease with heart failure and stage 1 through stage 4 chronic kidney disease, or unspecified chronic kidney disease: Secondary | ICD-10-CM | POA: Diagnosis not present

## 2023-05-26 NOTE — Progress Notes (Unsigned)
  Nails x 10 uses walker and dependent rubor bilateral         Subjective:  Patient ID: Jenny [name], female    DOB: February 23, 1933,  MRN: [id number]   Jenny [name] presents to clinic today for:  Chief Complaint  Patient presents with   Nail Problem    Routine foot care   . Patient notes nails are thick, discolored, elongated and painful in shoegear when trying to ambulate.    PCP is [facility]  Allergies  Allergen Reactions   Entresto [Sacubitril-Valsartan] Shortness Of Breath    COPD and reports increased SOB with Entresto   Aspirin Other (See Comments)    Can take 81 mg not 325 mg   Atorvastatin Itching    Itching and myaliga   Cyclobenzaprine     Other reaction(s): muscle relaxers-ulcer hemorrhage (hospitalized)   Lactose Intolerance (Gi) Other (See Comments)    GI upset   Meperidine Hcl Other (See Comments)    Not known   Pravastatin Rash    rash   Propoxyphene     Other reaction(s): pain meds, hallucination, vomiting   Wound Dressing Adhesive     Other reaction(s): red, stings    Review of Systems: Negative except as noted in the HPI.  Objective:  There were no vitals filed for this visit.  Jenny [name] is a pleasant 89 y.o. female in NAD. AAO x 3.  Vascular Examination: Capillary refill time is 3-5 seconds to toes bilateral. Palpable pedal pulses b/l LE. Digital hair present b/l. No pedal edema b/l. Skin temperature gradient WNL b/l. No varicosities b/l. No cyanosis or clubbing noted b/l.   Dermatological Examination: Pedal skin with normal turgor, texture and tone b/l. No open wounds. No interdigital macerations b/l. Toenails x10 are 3mm thick, discolored, dystrophic with subungual debris. There is pain with compression of the nail plates.  They are elongated x10  Neurological Examination: Protective sensation intact bilateral LE. Vibratory sensation intact bilateral LE.  Musculoskeletal Examination: Muscle strength 5/5 to all LE  muscle groups b/l.       No data to display           Assessment/Plan: No diagnosis found.  No orders of the defined types were placed in this encounter.  None  The mycotic toenails were sharply debrided x10 with sterile nail nippers and a power debriding burr to decrease bulk/thickness and length.    Return in about 3 months (around 08/26/2023) for RFC.   Clerance Lav, DPM, FACFAS [facility]     [address] [place], [place] [address]                Office [phone]  Fax [phone]

## 2023-05-27 DIAGNOSIS — E1122 Type 2 diabetes mellitus with diabetic chronic kidney disease: Secondary | ICD-10-CM | POA: Diagnosis not present

## 2023-05-27 DIAGNOSIS — E1142 Type 2 diabetes mellitus with diabetic polyneuropathy: Secondary | ICD-10-CM | POA: Diagnosis not present

## 2023-05-27 DIAGNOSIS — I130 Hypertensive heart and chronic kidney disease with heart failure and stage 1 through stage 4 chronic kidney disease, or unspecified chronic kidney disease: Secondary | ICD-10-CM | POA: Diagnosis not present

## 2023-05-27 DIAGNOSIS — I5030 Unspecified diastolic (congestive) heart failure: Secondary | ICD-10-CM | POA: Diagnosis not present

## 2023-05-27 DIAGNOSIS — F331 Major depressive disorder, recurrent, moderate: Secondary | ICD-10-CM | POA: Diagnosis not present

## 2023-05-27 DIAGNOSIS — E785 Hyperlipidemia, unspecified: Secondary | ICD-10-CM | POA: Diagnosis not present

## 2023-05-27 DIAGNOSIS — D649 Anemia, unspecified: Secondary | ICD-10-CM | POA: Diagnosis not present

## 2023-05-27 DIAGNOSIS — J449 Chronic obstructive pulmonary disease, unspecified: Secondary | ICD-10-CM | POA: Diagnosis not present

## 2023-05-27 DIAGNOSIS — E1151 Type 2 diabetes mellitus with diabetic peripheral angiopathy without gangrene: Secondary | ICD-10-CM | POA: Insufficient documentation

## 2023-05-27 DIAGNOSIS — R54 Age-related physical debility: Secondary | ICD-10-CM | POA: Diagnosis not present

## 2023-05-27 DIAGNOSIS — B351 Tinea unguium: Secondary | ICD-10-CM | POA: Insufficient documentation

## 2023-05-27 DIAGNOSIS — N184 Chronic kidney disease, stage 4 (severe): Secondary | ICD-10-CM | POA: Diagnosis not present

## 2023-05-30 DIAGNOSIS — E1142 Type 2 diabetes mellitus with diabetic polyneuropathy: Secondary | ICD-10-CM | POA: Diagnosis not present

## 2023-05-30 DIAGNOSIS — F331 Major depressive disorder, recurrent, moderate: Secondary | ICD-10-CM | POA: Diagnosis not present

## 2023-05-30 DIAGNOSIS — F17200 Nicotine dependence, unspecified, uncomplicated: Secondary | ICD-10-CM | POA: Diagnosis not present

## 2023-05-30 DIAGNOSIS — Z7901 Long term (current) use of anticoagulants: Secondary | ICD-10-CM | POA: Diagnosis not present

## 2023-05-30 DIAGNOSIS — Z7951 Long term (current) use of inhaled steroids: Secondary | ICD-10-CM | POA: Diagnosis not present

## 2023-05-30 DIAGNOSIS — K219 Gastro-esophageal reflux disease without esophagitis: Secondary | ICD-10-CM | POA: Diagnosis not present

## 2023-05-30 DIAGNOSIS — D631 Anemia in chronic kidney disease: Secondary | ICD-10-CM | POA: Diagnosis not present

## 2023-05-30 DIAGNOSIS — H54414A Blindness right eye category 4, normal vision left eye: Secondary | ICD-10-CM | POA: Diagnosis not present

## 2023-05-30 DIAGNOSIS — I4819 Other persistent atrial fibrillation: Secondary | ICD-10-CM | POA: Diagnosis not present

## 2023-05-30 DIAGNOSIS — Z6835 Body mass index (BMI) 35.0-35.9, adult: Secondary | ICD-10-CM | POA: Diagnosis not present

## 2023-05-30 DIAGNOSIS — E785 Hyperlipidemia, unspecified: Secondary | ICD-10-CM | POA: Diagnosis not present

## 2023-05-30 DIAGNOSIS — I2510 Atherosclerotic heart disease of native coronary artery without angina pectoris: Secondary | ICD-10-CM | POA: Diagnosis not present

## 2023-05-30 DIAGNOSIS — I130 Hypertensive heart and chronic kidney disease with heart failure and stage 1 through stage 4 chronic kidney disease, or unspecified chronic kidney disease: Secondary | ICD-10-CM | POA: Diagnosis not present

## 2023-05-30 DIAGNOSIS — I872 Venous insufficiency (chronic) (peripheral): Secondary | ICD-10-CM | POA: Diagnosis not present

## 2023-05-30 DIAGNOSIS — I5032 Chronic diastolic (congestive) heart failure: Secondary | ICD-10-CM | POA: Diagnosis not present

## 2023-05-30 DIAGNOSIS — H409 Unspecified glaucoma: Secondary | ICD-10-CM | POA: Diagnosis not present

## 2023-05-30 DIAGNOSIS — J449 Chronic obstructive pulmonary disease, unspecified: Secondary | ICD-10-CM | POA: Diagnosis not present

## 2023-05-30 DIAGNOSIS — M810 Age-related osteoporosis without current pathological fracture: Secondary | ICD-10-CM | POA: Diagnosis not present

## 2023-05-30 DIAGNOSIS — E1151 Type 2 diabetes mellitus with diabetic peripheral angiopathy without gangrene: Secondary | ICD-10-CM | POA: Diagnosis not present

## 2023-05-30 DIAGNOSIS — Z8673 Personal history of transient ischemic attack (TIA), and cerebral infarction without residual deficits: Secondary | ICD-10-CM | POA: Diagnosis not present

## 2023-05-30 DIAGNOSIS — E1122 Type 2 diabetes mellitus with diabetic chronic kidney disease: Secondary | ICD-10-CM | POA: Diagnosis not present

## 2023-05-30 DIAGNOSIS — N1832 Chronic kidney disease, stage 3b: Secondary | ICD-10-CM | POA: Diagnosis not present

## 2023-06-03 DIAGNOSIS — F331 Major depressive disorder, recurrent, moderate: Secondary | ICD-10-CM | POA: Diagnosis not present

## 2023-06-03 DIAGNOSIS — I130 Hypertensive heart and chronic kidney disease with heart failure and stage 1 through stage 4 chronic kidney disease, or unspecified chronic kidney disease: Secondary | ICD-10-CM | POA: Diagnosis not present

## 2023-06-03 DIAGNOSIS — I5032 Chronic diastolic (congestive) heart failure: Secondary | ICD-10-CM | POA: Diagnosis not present

## 2023-06-03 DIAGNOSIS — E1151 Type 2 diabetes mellitus with diabetic peripheral angiopathy without gangrene: Secondary | ICD-10-CM | POA: Diagnosis not present

## 2023-06-03 DIAGNOSIS — I872 Venous insufficiency (chronic) (peripheral): Secondary | ICD-10-CM | POA: Diagnosis not present

## 2023-06-03 DIAGNOSIS — J449 Chronic obstructive pulmonary disease, unspecified: Secondary | ICD-10-CM | POA: Diagnosis not present

## 2023-06-06 IMAGING — CR DG CHEST 2V
1 series · 2 of 2 positions shown · non-contrast
Comparison: 08/22/2021

CLINICAL DATA: Shortness of breath since [REDACTED], COPD, atrial
fibrillation

EXAM:
CHEST - 2 VIEW

[Series 1: dg chest 2 view · 0.14mm/px · 2 of 2 slices shown]
[im 1/2]
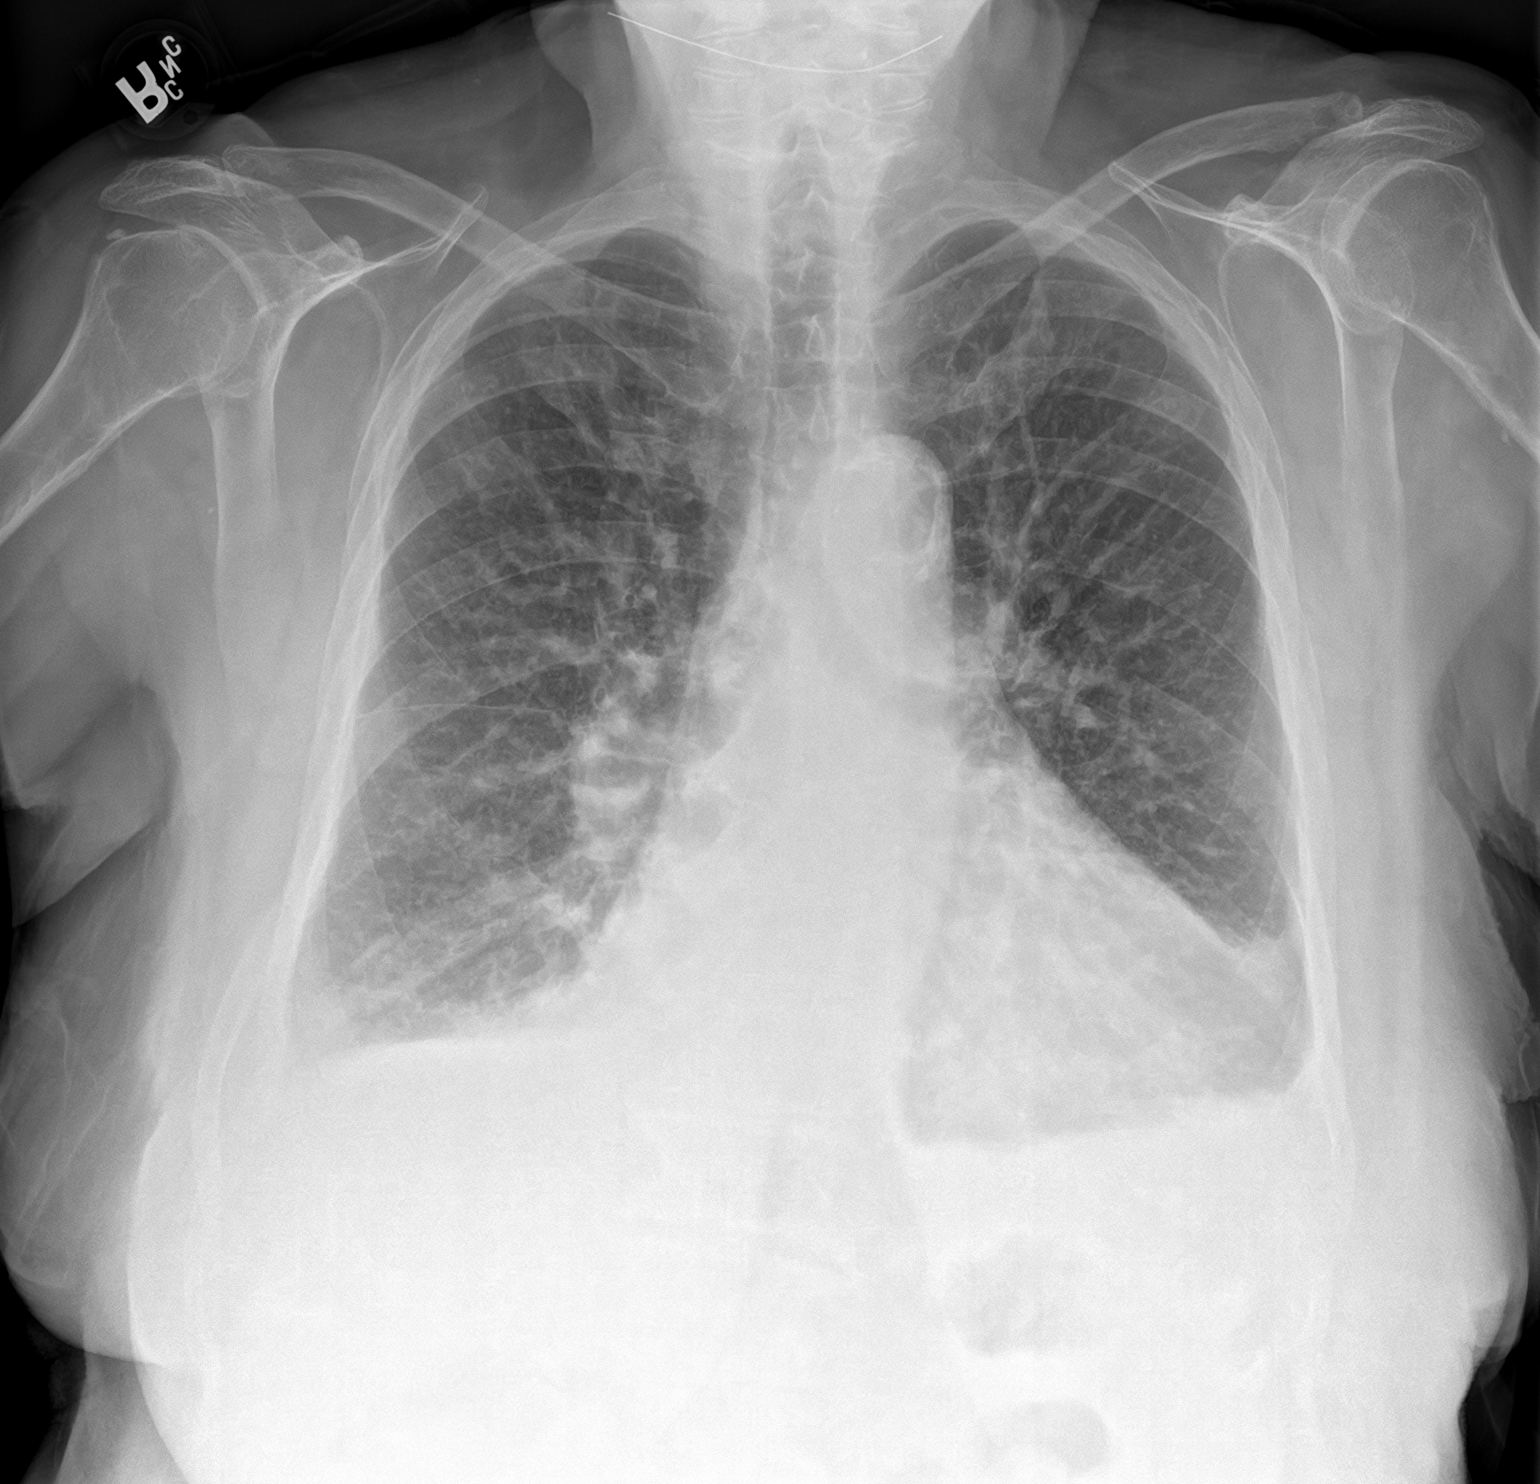
[im 2/2]
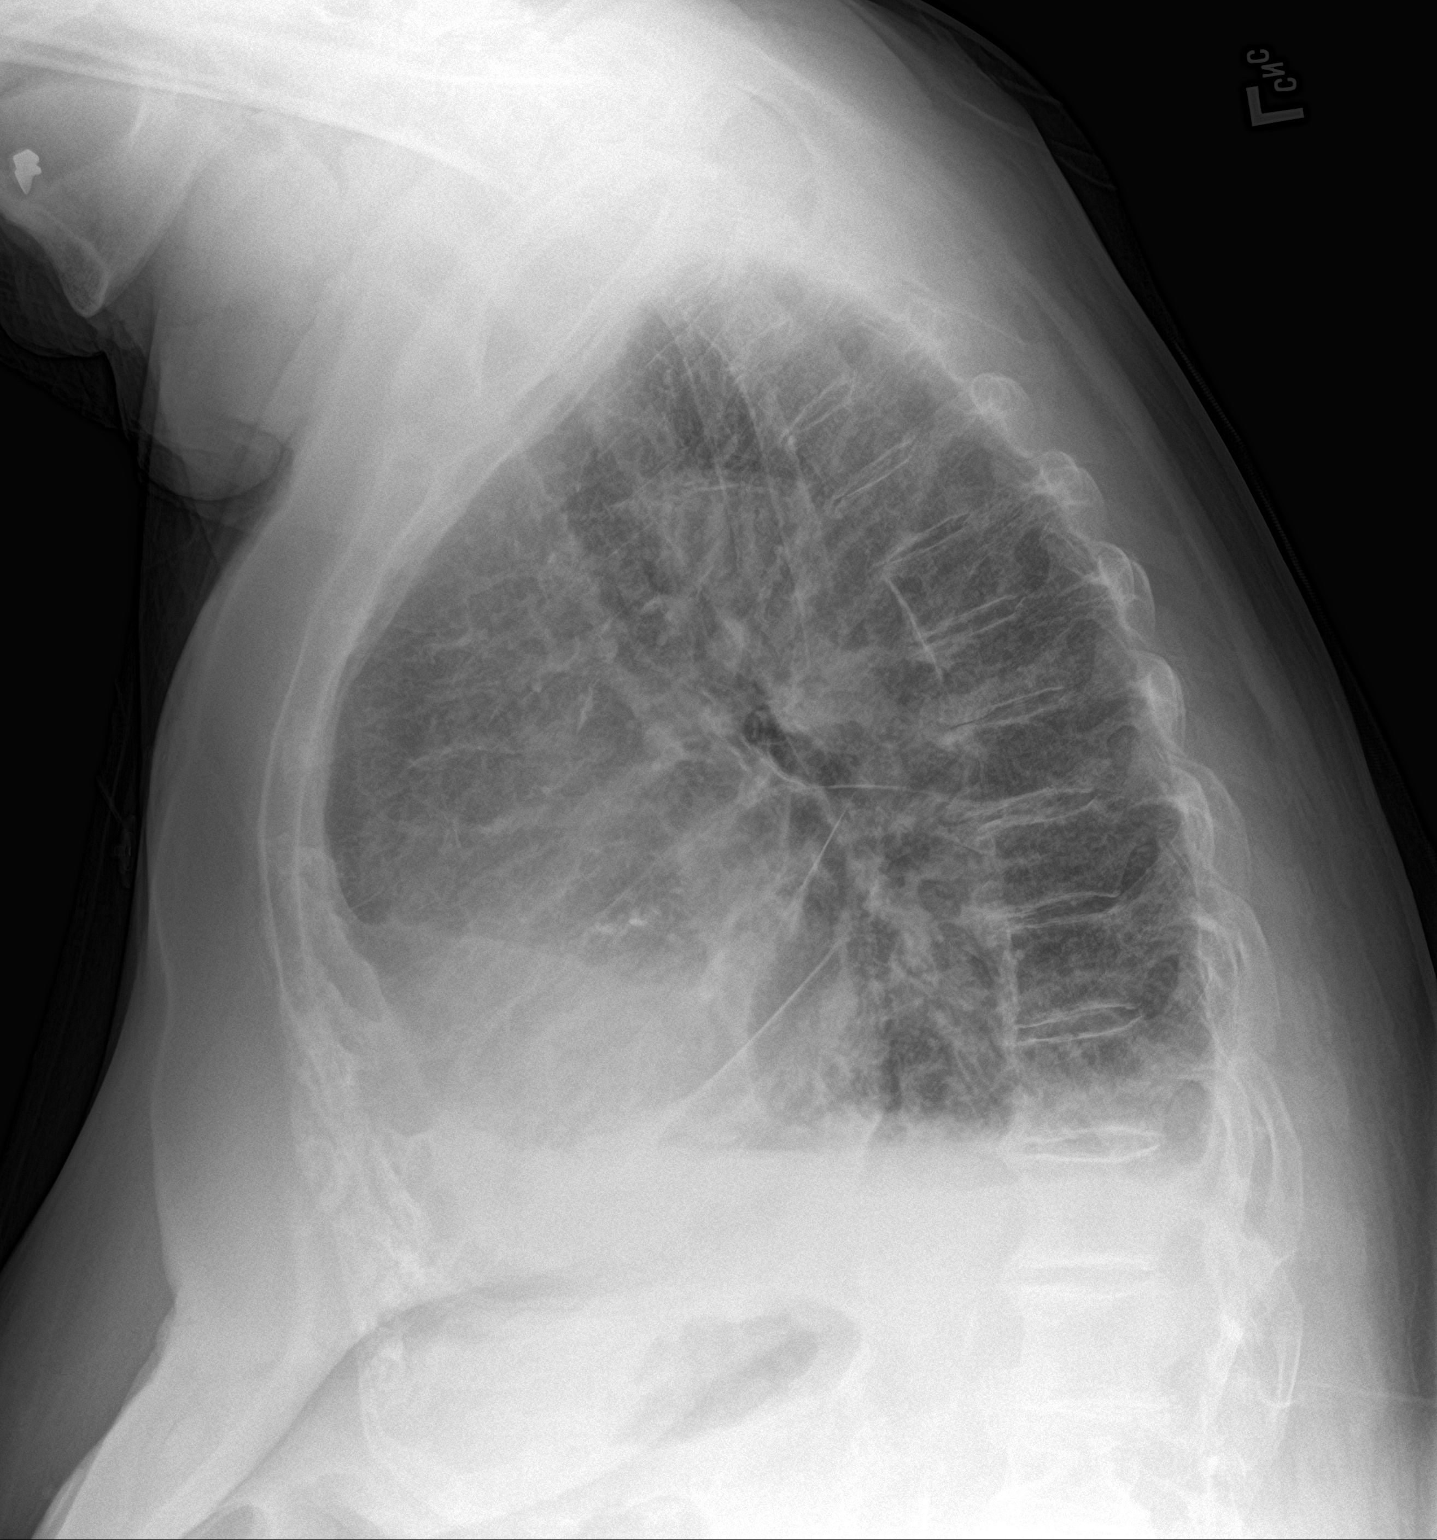

[2 of 2 positions shown; findings below may reference images not displayed]

FINDINGS: Enlargement of cardiac silhouette with pulmonary vascular
congestion.

Atherosclerotic calcification aorta.

Slight interstitial prominence versus previous exam consistent with
mild pulmonary edema and CHF.

Small bibasilar pleural effusions and minimal basilar atelectasis,
new.

Underlying flattening of the diaphragms suggesting COPD

No pneumothorax.

Bones demineralized.
IMPRESSION: Mild CHF with small bibasilar pleural effusions and basilar
atelectasis.

Aortic Atherosclerosis (VXPDO-5R3.3).  Emphysema (VXPDO-59B.3).

## 2023-06-08 DIAGNOSIS — J449 Chronic obstructive pulmonary disease, unspecified: Secondary | ICD-10-CM | POA: Diagnosis not present

## 2023-06-11 DIAGNOSIS — J449 Chronic obstructive pulmonary disease, unspecified: Secondary | ICD-10-CM | POA: Diagnosis not present

## 2023-06-11 DIAGNOSIS — E1151 Type 2 diabetes mellitus with diabetic peripheral angiopathy without gangrene: Secondary | ICD-10-CM | POA: Diagnosis not present

## 2023-06-11 DIAGNOSIS — I5032 Chronic diastolic (congestive) heart failure: Secondary | ICD-10-CM | POA: Diagnosis not present

## 2023-06-11 DIAGNOSIS — F331 Major depressive disorder, recurrent, moderate: Secondary | ICD-10-CM | POA: Diagnosis not present

## 2023-06-11 DIAGNOSIS — I130 Hypertensive heart and chronic kidney disease with heart failure and stage 1 through stage 4 chronic kidney disease, or unspecified chronic kidney disease: Secondary | ICD-10-CM | POA: Diagnosis not present

## 2023-06-11 DIAGNOSIS — I872 Venous insufficiency (chronic) (peripheral): Secondary | ICD-10-CM | POA: Diagnosis not present

## 2023-06-18 DIAGNOSIS — J449 Chronic obstructive pulmonary disease, unspecified: Secondary | ICD-10-CM | POA: Diagnosis not present

## 2023-06-18 DIAGNOSIS — I130 Hypertensive heart and chronic kidney disease with heart failure and stage 1 through stage 4 chronic kidney disease, or unspecified chronic kidney disease: Secondary | ICD-10-CM | POA: Diagnosis not present

## 2023-06-18 DIAGNOSIS — I5032 Chronic diastolic (congestive) heart failure: Secondary | ICD-10-CM | POA: Diagnosis not present

## 2023-06-18 DIAGNOSIS — I872 Venous insufficiency (chronic) (peripheral): Secondary | ICD-10-CM | POA: Diagnosis not present

## 2023-06-18 DIAGNOSIS — F331 Major depressive disorder, recurrent, moderate: Secondary | ICD-10-CM | POA: Diagnosis not present

## 2023-06-18 DIAGNOSIS — E1151 Type 2 diabetes mellitus with diabetic peripheral angiopathy without gangrene: Secondary | ICD-10-CM | POA: Diagnosis not present

## 2023-06-20 DIAGNOSIS — J449 Chronic obstructive pulmonary disease, unspecified: Secondary | ICD-10-CM | POA: Diagnosis not present

## 2023-06-20 DIAGNOSIS — I110 Hypertensive heart disease with heart failure: Secondary | ICD-10-CM | POA: Diagnosis not present

## 2023-06-20 DIAGNOSIS — R54 Age-related physical debility: Secondary | ICD-10-CM | POA: Diagnosis not present

## 2023-06-20 DIAGNOSIS — I5030 Unspecified diastolic (congestive) heart failure: Secondary | ICD-10-CM | POA: Diagnosis not present

## 2023-06-23 DIAGNOSIS — J449 Chronic obstructive pulmonary disease, unspecified: Secondary | ICD-10-CM | POA: Diagnosis not present

## 2023-06-24 DIAGNOSIS — I130 Hypertensive heart and chronic kidney disease with heart failure and stage 1 through stage 4 chronic kidney disease, or unspecified chronic kidney disease: Secondary | ICD-10-CM | POA: Diagnosis not present

## 2023-06-24 DIAGNOSIS — I5032 Chronic diastolic (congestive) heart failure: Secondary | ICD-10-CM | POA: Diagnosis not present

## 2023-06-24 DIAGNOSIS — F331 Major depressive disorder, recurrent, moderate: Secondary | ICD-10-CM | POA: Diagnosis not present

## 2023-06-24 DIAGNOSIS — E1151 Type 2 diabetes mellitus with diabetic peripheral angiopathy without gangrene: Secondary | ICD-10-CM | POA: Diagnosis not present

## 2023-06-24 DIAGNOSIS — I872 Venous insufficiency (chronic) (peripheral): Secondary | ICD-10-CM | POA: Diagnosis not present

## 2023-06-24 DIAGNOSIS — J449 Chronic obstructive pulmonary disease, unspecified: Secondary | ICD-10-CM | POA: Diagnosis not present

## 2023-06-29 DIAGNOSIS — E1151 Type 2 diabetes mellitus with diabetic peripheral angiopathy without gangrene: Secondary | ICD-10-CM | POA: Diagnosis not present

## 2023-06-29 DIAGNOSIS — E1142 Type 2 diabetes mellitus with diabetic polyneuropathy: Secondary | ICD-10-CM | POA: Diagnosis not present

## 2023-06-29 DIAGNOSIS — Z6835 Body mass index (BMI) 35.0-35.9, adult: Secondary | ICD-10-CM | POA: Diagnosis not present

## 2023-06-29 DIAGNOSIS — K219 Gastro-esophageal reflux disease without esophagitis: Secondary | ICD-10-CM | POA: Diagnosis not present

## 2023-06-29 DIAGNOSIS — H409 Unspecified glaucoma: Secondary | ICD-10-CM | POA: Diagnosis not present

## 2023-06-29 DIAGNOSIS — E1122 Type 2 diabetes mellitus with diabetic chronic kidney disease: Secondary | ICD-10-CM | POA: Diagnosis not present

## 2023-06-29 DIAGNOSIS — Z7951 Long term (current) use of inhaled steroids: Secondary | ICD-10-CM | POA: Diagnosis not present

## 2023-06-29 DIAGNOSIS — J449 Chronic obstructive pulmonary disease, unspecified: Secondary | ICD-10-CM | POA: Diagnosis not present

## 2023-06-29 DIAGNOSIS — I4819 Other persistent atrial fibrillation: Secondary | ICD-10-CM | POA: Diagnosis not present

## 2023-06-29 DIAGNOSIS — H54414A Blindness right eye category 4, normal vision left eye: Secondary | ICD-10-CM | POA: Diagnosis not present

## 2023-06-29 DIAGNOSIS — F331 Major depressive disorder, recurrent, moderate: Secondary | ICD-10-CM | POA: Diagnosis not present

## 2023-06-29 DIAGNOSIS — E785 Hyperlipidemia, unspecified: Secondary | ICD-10-CM | POA: Diagnosis not present

## 2023-06-29 DIAGNOSIS — M810 Age-related osteoporosis without current pathological fracture: Secondary | ICD-10-CM | POA: Diagnosis not present

## 2023-06-29 DIAGNOSIS — D631 Anemia in chronic kidney disease: Secondary | ICD-10-CM | POA: Diagnosis not present

## 2023-06-29 DIAGNOSIS — Z7901 Long term (current) use of anticoagulants: Secondary | ICD-10-CM | POA: Diagnosis not present

## 2023-06-29 DIAGNOSIS — I130 Hypertensive heart and chronic kidney disease with heart failure and stage 1 through stage 4 chronic kidney disease, or unspecified chronic kidney disease: Secondary | ICD-10-CM | POA: Diagnosis not present

## 2023-06-29 DIAGNOSIS — F17200 Nicotine dependence, unspecified, uncomplicated: Secondary | ICD-10-CM | POA: Diagnosis not present

## 2023-06-29 DIAGNOSIS — Z8673 Personal history of transient ischemic attack (TIA), and cerebral infarction without residual deficits: Secondary | ICD-10-CM | POA: Diagnosis not present

## 2023-06-29 DIAGNOSIS — I872 Venous insufficiency (chronic) (peripheral): Secondary | ICD-10-CM | POA: Diagnosis not present

## 2023-06-29 DIAGNOSIS — I2510 Atherosclerotic heart disease of native coronary artery without angina pectoris: Secondary | ICD-10-CM | POA: Diagnosis not present

## 2023-06-29 DIAGNOSIS — N1832 Chronic kidney disease, stage 3b: Secondary | ICD-10-CM | POA: Diagnosis not present

## 2023-06-29 DIAGNOSIS — I5032 Chronic diastolic (congestive) heart failure: Secondary | ICD-10-CM | POA: Diagnosis not present

## 2023-07-09 DIAGNOSIS — I5032 Chronic diastolic (congestive) heart failure: Secondary | ICD-10-CM | POA: Diagnosis not present

## 2023-07-09 DIAGNOSIS — I872 Venous insufficiency (chronic) (peripheral): Secondary | ICD-10-CM | POA: Diagnosis not present

## 2023-07-09 DIAGNOSIS — E1151 Type 2 diabetes mellitus with diabetic peripheral angiopathy without gangrene: Secondary | ICD-10-CM | POA: Diagnosis not present

## 2023-07-09 DIAGNOSIS — I130 Hypertensive heart and chronic kidney disease with heart failure and stage 1 through stage 4 chronic kidney disease, or unspecified chronic kidney disease: Secondary | ICD-10-CM | POA: Diagnosis not present

## 2023-07-09 DIAGNOSIS — F331 Major depressive disorder, recurrent, moderate: Secondary | ICD-10-CM | POA: Diagnosis not present

## 2023-07-09 DIAGNOSIS — J449 Chronic obstructive pulmonary disease, unspecified: Secondary | ICD-10-CM | POA: Diagnosis not present

## 2023-07-18 DIAGNOSIS — I130 Hypertensive heart and chronic kidney disease with heart failure and stage 1 through stage 4 chronic kidney disease, or unspecified chronic kidney disease: Secondary | ICD-10-CM | POA: Diagnosis not present

## 2023-07-18 DIAGNOSIS — I5030 Unspecified diastolic (congestive) heart failure: Secondary | ICD-10-CM | POA: Diagnosis not present

## 2023-07-18 DIAGNOSIS — N1832 Chronic kidney disease, stage 3b: Secondary | ICD-10-CM | POA: Diagnosis not present

## 2023-07-18 DIAGNOSIS — K219 Gastro-esophageal reflux disease without esophagitis: Secondary | ICD-10-CM | POA: Diagnosis not present

## 2023-07-18 DIAGNOSIS — E785 Hyperlipidemia, unspecified: Secondary | ICD-10-CM | POA: Diagnosis not present

## 2023-07-18 DIAGNOSIS — B372 Candidiasis of skin and nail: Secondary | ICD-10-CM | POA: Diagnosis not present

## 2023-07-18 DIAGNOSIS — E1142 Type 2 diabetes mellitus with diabetic polyneuropathy: Secondary | ICD-10-CM | POA: Diagnosis not present

## 2023-07-23 DIAGNOSIS — J449 Chronic obstructive pulmonary disease, unspecified: Secondary | ICD-10-CM | POA: Diagnosis not present

## 2023-07-24 ENCOUNTER — Ambulatory Visit: Payer: Medicare Other | Admitting: Cardiovascular Disease

## 2023-07-24 DIAGNOSIS — J449 Chronic obstructive pulmonary disease, unspecified: Secondary | ICD-10-CM | POA: Diagnosis not present

## 2023-07-24 DIAGNOSIS — F331 Major depressive disorder, recurrent, moderate: Secondary | ICD-10-CM | POA: Diagnosis not present

## 2023-07-24 DIAGNOSIS — I5032 Chronic diastolic (congestive) heart failure: Secondary | ICD-10-CM | POA: Diagnosis not present

## 2023-07-24 DIAGNOSIS — I872 Venous insufficiency (chronic) (peripheral): Secondary | ICD-10-CM | POA: Diagnosis not present

## 2023-07-24 DIAGNOSIS — E1151 Type 2 diabetes mellitus with diabetic peripheral angiopathy without gangrene: Secondary | ICD-10-CM | POA: Diagnosis not present

## 2023-07-24 DIAGNOSIS — I130 Hypertensive heart and chronic kidney disease with heart failure and stage 1 through stage 4 chronic kidney disease, or unspecified chronic kidney disease: Secondary | ICD-10-CM | POA: Diagnosis not present

## 2023-07-30 DIAGNOSIS — E1122 Type 2 diabetes mellitus with diabetic chronic kidney disease: Secondary | ICD-10-CM | POA: Diagnosis not present

## 2023-07-30 DIAGNOSIS — R54 Age-related physical debility: Secondary | ICD-10-CM | POA: Diagnosis not present

## 2023-07-30 DIAGNOSIS — E1142 Type 2 diabetes mellitus with diabetic polyneuropathy: Secondary | ICD-10-CM | POA: Diagnosis not present

## 2023-07-30 DIAGNOSIS — H409 Unspecified glaucoma: Secondary | ICD-10-CM | POA: Diagnosis not present

## 2023-07-30 DIAGNOSIS — I5030 Unspecified diastolic (congestive) heart failure: Secondary | ICD-10-CM | POA: Diagnosis not present

## 2023-07-30 DIAGNOSIS — N184 Chronic kidney disease, stage 4 (severe): Secondary | ICD-10-CM | POA: Diagnosis not present

## 2023-07-30 DIAGNOSIS — I130 Hypertensive heart and chronic kidney disease with heart failure and stage 1 through stage 4 chronic kidney disease, or unspecified chronic kidney disease: Secondary | ICD-10-CM | POA: Diagnosis not present

## 2023-07-30 DIAGNOSIS — D649 Anemia, unspecified: Secondary | ICD-10-CM | POA: Diagnosis not present

## 2023-07-30 DIAGNOSIS — J449 Chronic obstructive pulmonary disease, unspecified: Secondary | ICD-10-CM | POA: Diagnosis not present

## 2023-07-30 DIAGNOSIS — F331 Major depressive disorder, recurrent, moderate: Secondary | ICD-10-CM | POA: Diagnosis not present

## 2023-07-30 DIAGNOSIS — E785 Hyperlipidemia, unspecified: Secondary | ICD-10-CM | POA: Diagnosis not present

## 2023-08-06 IMAGING — CR DG CHEST 2V
1 series · 2 of 2 positions shown · non-contrast
Comparison: 11/03/2021

CLINICAL DATA: Hypoxia

EXAM:
CHEST - 2 VIEW

[Series 1: dg chest 2 view · 0.14mm/px · 2 of 2 slices shown]
[im 1/2]
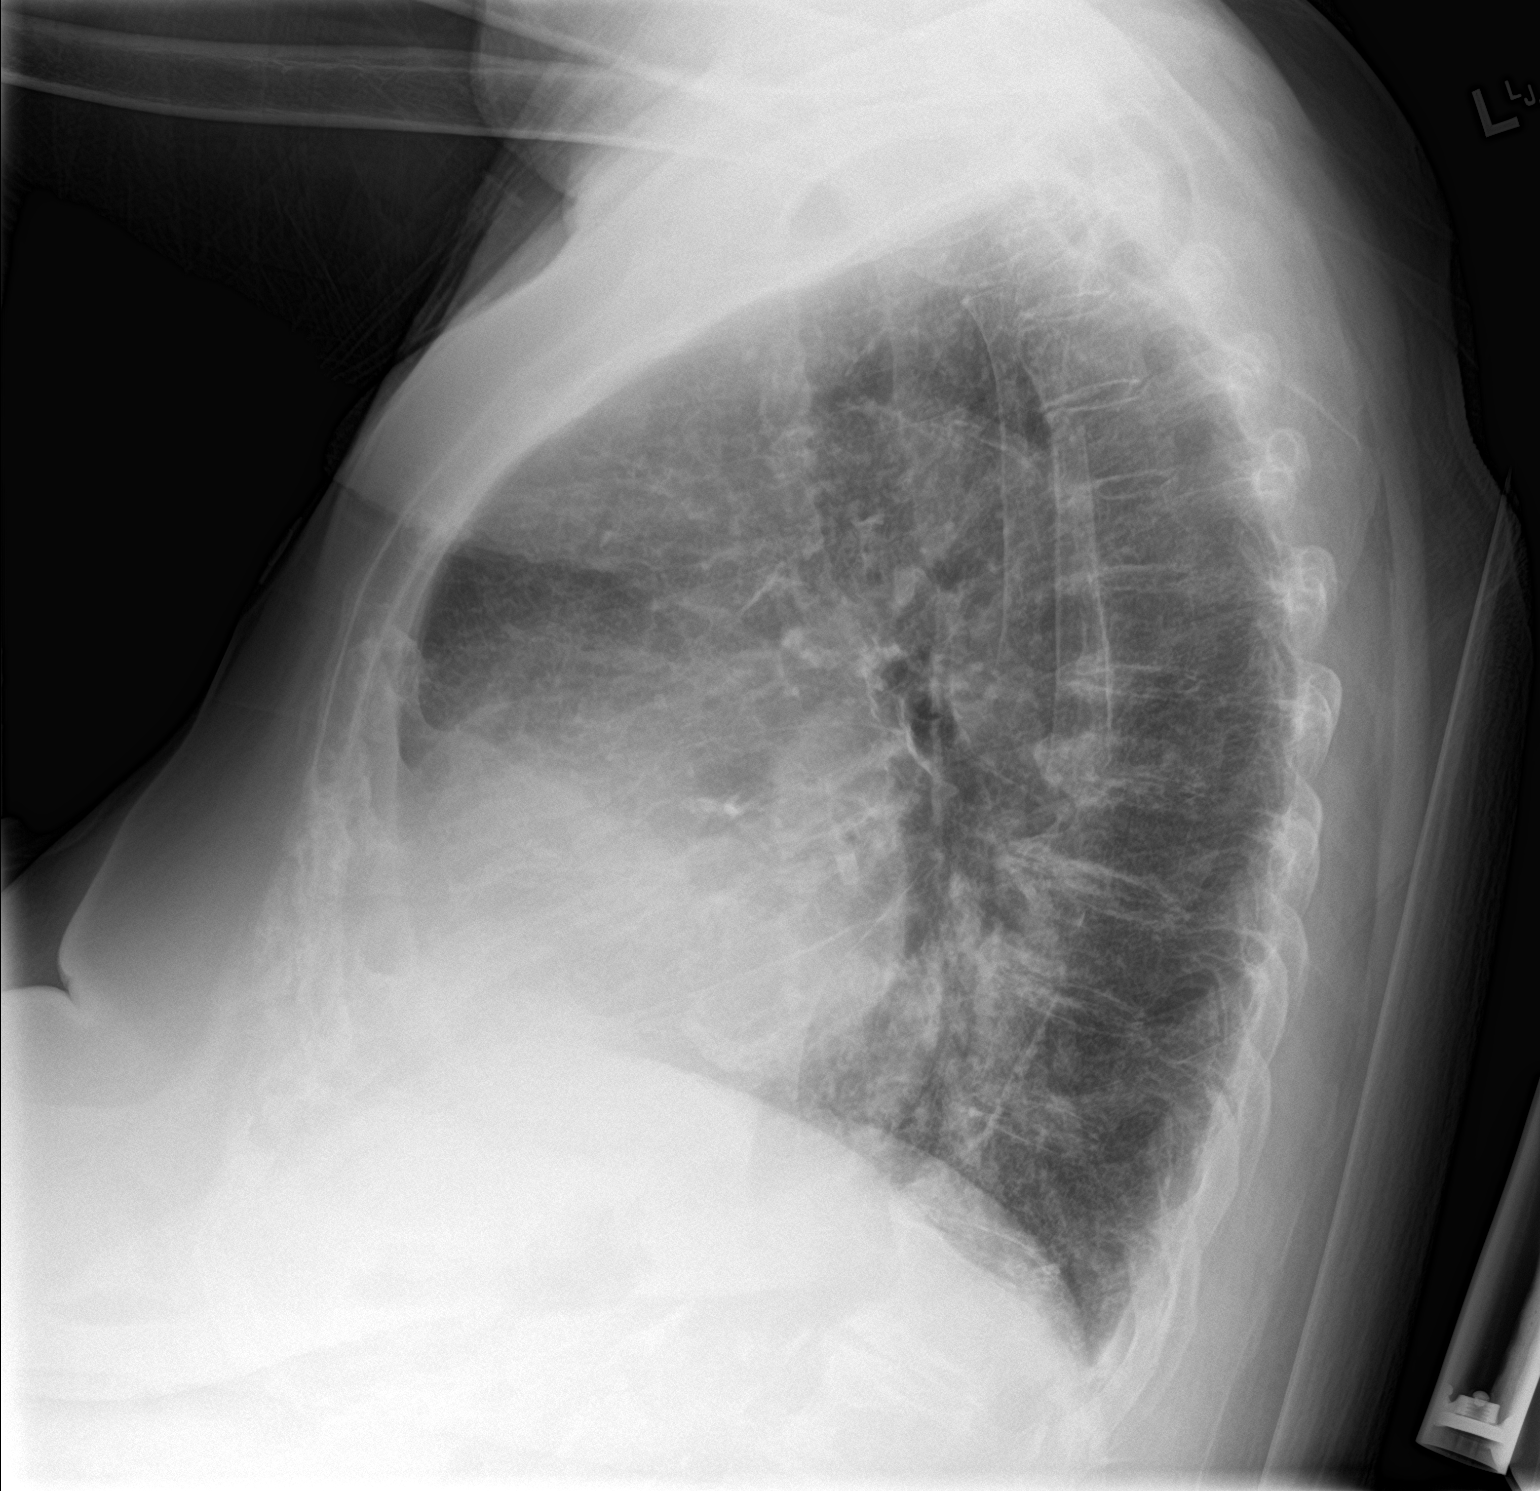
[im 2/2]
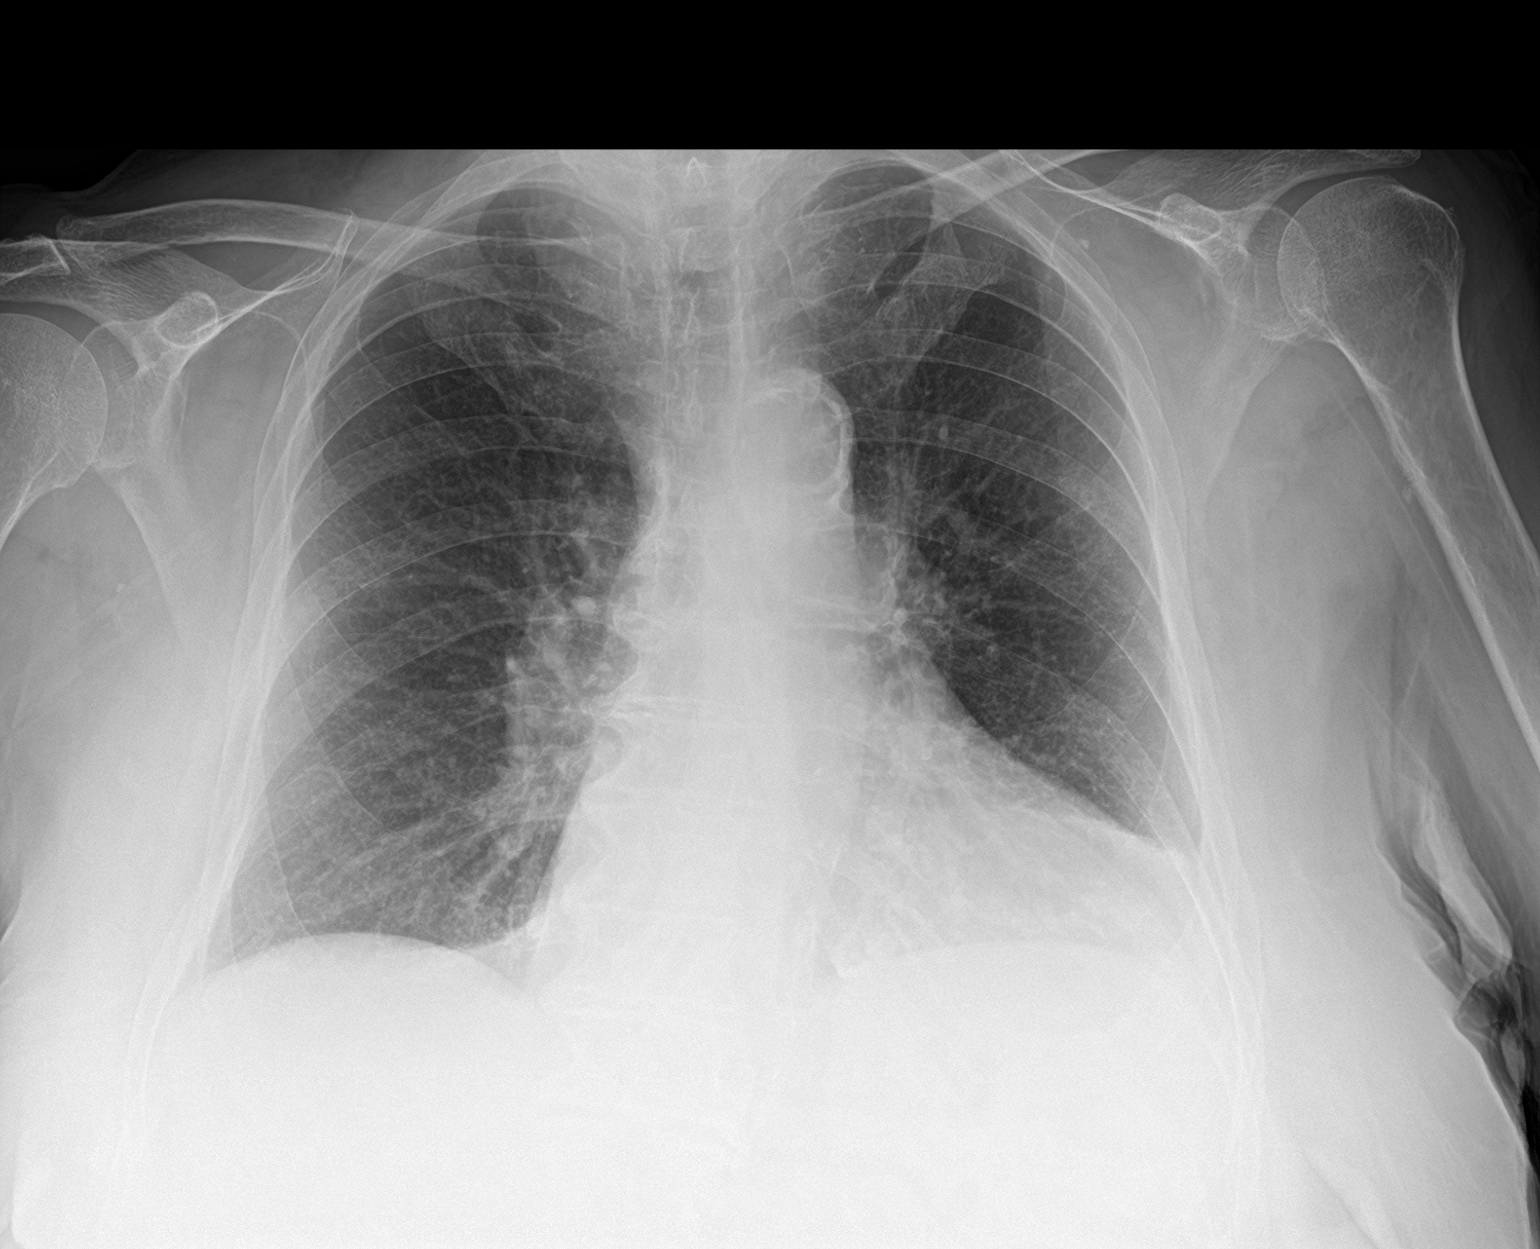

[2 of 2 positions shown; findings below may reference images not displayed]

FINDINGS: Stable cardiomegaly. Aortic atherosclerosis. No focal airspace
consolidation, pleural effusion, or pneumothorax.
IMPRESSION: No active cardiopulmonary disease.

## 2023-08-09 DIAGNOSIS — J449 Chronic obstructive pulmonary disease, unspecified: Secondary | ICD-10-CM | POA: Diagnosis not present

## 2023-08-12 ENCOUNTER — Encounter: Payer: Self-pay | Admitting: Family

## 2023-08-12 ENCOUNTER — Ambulatory Visit: Payer: Medicare Other | Attending: Family | Admitting: Family

## 2023-08-12 VITALS — BP 137/56 | HR 42 | Wt 190.0 lb

## 2023-08-12 DIAGNOSIS — I480 Paroxysmal atrial fibrillation: Secondary | ICD-10-CM | POA: Diagnosis not present

## 2023-08-12 DIAGNOSIS — I10 Essential (primary) hypertension: Secondary | ICD-10-CM

## 2023-08-12 DIAGNOSIS — I5032 Chronic diastolic (congestive) heart failure: Secondary | ICD-10-CM

## 2023-08-12 DIAGNOSIS — I428 Other cardiomyopathies: Secondary | ICD-10-CM | POA: Insufficient documentation

## 2023-08-12 DIAGNOSIS — J449 Chronic obstructive pulmonary disease, unspecified: Secondary | ICD-10-CM | POA: Diagnosis not present

## 2023-08-12 DIAGNOSIS — I4819 Other persistent atrial fibrillation: Secondary | ICD-10-CM | POA: Insufficient documentation

## 2023-08-12 DIAGNOSIS — I110 Hypertensive heart disease with heart failure: Secondary | ICD-10-CM | POA: Diagnosis not present

## 2023-08-12 DIAGNOSIS — Z87891 Personal history of nicotine dependence: Secondary | ICD-10-CM | POA: Diagnosis not present

## 2023-08-12 NOTE — Progress Notes (Signed)
PCP: seeing [facility] every 2 weeks Primary Cardiologist: Melene Muller, MD (last seen 02/24)  HPI:  [name] is a 89 y/o female with a history of CAD, hyperlipidemia, HTN, anemia, COPD, glaucoma, atrial fibrillation, recent tobacco use and chronic heart failure.   Echo 08/22/21: EF of 60-65% along with mild LVH/ LAE, mild MR and severely elevated PA pressure of 65.1 mmHg.  Echo 04/09/23: EF 65-70% along with Grade II DD, severely elevated PA pressure, mild LAE, moderate RAE and mild/ moderate TR.    Stress test 12/2013  Has not been admitted or been in the ED in the last 6 months. Cardioversion 01/30/22  She presents today for a HF follow-up visit with a chief complaint of   She presents for a HF f/u visit with a chief complaint of minimal SOB with moderate exertion. Chronic in nature. Has associated intermittent dizziness, cough and chronic difficulty sleeping. Denies fatigue, chest pain, palpitations, pedal edema, abdominal distention or weight gain. She feels like she is allergic to her cats as she will cough when she's home but when she comes to appointments, the cough subsides. She has been taking torsemide 20mg  BID with additional 20mg  as needed.   Continues to have issues with yeast under her breasts/ groin area and she feels like it's her inhaler that has the powder in it. She says that before she started this, she "never had yeast". She plans to talk with her PCP at next visit.   Stopped doing physical therapy due to the heat as she was doing PT outside because of her cats. She plans to resume this once it becomes cooler outside.    ROS: All systems negative except as listed in HPI, PMH and Problem List.  SH:  Social History   Socioeconomic History   Marital status: Widowed    Spouse name: Not on file   Number of children: Not on file   Years of education: Not on file   Highest education level: Not on file  Occupational History   Not on file  Tobacco Use    Smoking status: Former    Current packs/day: 0.00    Average packs/day: 0.5 packs/day for 60.0 years (30.0 ttl pk-yrs)    Types: Cigarettes    Start date: 11/12/1961    Quit date: 11/12/2021    Years since quitting: 1.7   Smokeless tobacco: Never   Tobacco comments:    She lives in [place] with her significant other (Jenny [name])0  Vaping Use   Vaping status: Never Used  Substance and Sexual Activity   Alcohol use: Yes    Alcohol/week: 1.0 - 2.0 standard drink of alcohol    Types: 1 - 2 Glasses of wine per week    Comment: once or twice a year glass of wine 12/25/21   Drug use: No   Sexual activity: Not on file  Other Topics Concern   Not on file  Social History Narrative   Lives in [place] with her SO - Jenny [name]   Retired -Acupuncturist   Enjoys travel - live Engineer, maintenance (IT)   Social Determinants of Corporate investment banker Strain: Not on Ship broker Insecurity: Not on file  Transportation Needs: Not on file  Physical Activity: Not on file  Stress: Not on file  Social Connections: Not on file  Intimate Partner Violence: Not on file    FH:  Family History  Problem Relation Age of Onset   Hypertension Mother  Ulcers Mother    Stomach cancer Father    Stroke Maternal Grandmother    Heart attack Neg Hx     Past Medical History:  Diagnosis Date   ACUT DUOD ULCER W/HEMORR&PERF W/O MENTION OBST 10/05/2009   NSAID induced   ALLERGIC RHINITIS CAUSE UNSPECIFIED    ANEMIA-NOS    CAD (coronary artery disease) 06/08/2009   DEs RCA with 70% LAD and EF 60%   CHF (congestive heart failure) (HCC)    COPD    mild obst on PFTs 03/2010   Diabetes mellitus 06/2010 dx   Mild, diet controlled   GLAUCOMA    HYPERLIPIDEMIA    HYPERTENSION, BENIGN    MYOCARDIAL INFARCTION 06/08/2009   des to rca   Persistent atrial fibrillation (HCC)    Dx 08/2021   TOBACCO ABUSE     Current Outpatient Medications  Medication Sig Dispense Refill   acetaminophen (TYLENOL) 500  MG tablet Take 500 mg by mouth every 6 (six) hours as needed. Takes two tablets in the morning and two tablets at night     albuterol (VENTOLIN HFA) 108 (90 Base) MCG/ACT inhaler INHALE ONE PUFF EVERY 6 HOURS AS NEEDED FOR SHORTNESS OF BREATH 18 g 2   apixaban (ELIQUIS) 5 MG TABS tablet TAKE ONE TABLET TWICE DAILY 180 tablet 1   dorzolamide (TRUSOPT) 2 % ophthalmic solution Place 1 drop into both eyes 2 (two) times daily.     Fluticasone-Umeclidin-Vilant (TRELEGY ELLIPTA) 100-62.5-25 MCG/INH AEPB Inhale 1 puff into the lungs daily. 1 each 1   gabapentin (NEURONTIN) 100 MG capsule Take 100-200 mg by mouth See admin instructions. Take 1 capsule (100mg ) by mouth every morning and take 2 capsules (200mg ) by mouth at bedtime     ipratropium-albuterol (DUONEB) 0.5-2.5 (3) MG/3ML SOLN Take 3 mLs by nebulization every 6 (six) hours as needed. 360 mL 1   latanoprost (XALATAN) 0.005 % ophthalmic solution Place 1 drop into both eyes in the morning.     magnesium hydroxide (MILK OF MAGNESIA) 400 MG/5ML suspension Take 30 mLs by mouth as needed for mild constipation.     NITROSTAT 0.4 MG SL tablet DISSOLVE ONE TABLET UNDER TONGUE AS NEEDED FOR CHEST PAIN - MAY REPEAT TWICE-IF NO RELIEF GO TO NEAREST HOSPITAL ER 25 tablet 0   nystatin (MYCOSTATIN/NYSTOP) powder Apply 1 application topically daily.     potassium chloride SA (KLOR-CON M) 20 MEQ tablet Take 1 tablet (20 mEq total) by mouth 2 (two) times daily. (Patient taking differently: Take 20 mEq by mouth as needed (leg jerking).) 60 tablet 3   rosuvastatin (CRESTOR) 20 MG tablet Take 20 mg by mouth daily.     torsemide (DEMADEX) 20 MG tablet Take 2 tablets (40 mg total) by mouth 2 (two) times daily. (Patient taking differently: Take 20-40 mg by mouth daily. 20 mg daily + extra 20 mg as needed for fluid) 120 tablet 5   No current facility-administered medications for this visit.   Vitals:   08/12/23 0854  BP: (!) 137/56  Pulse: (!) 42  SpO2: 98%  Weight:  190 lb (86.2 kg)   Wt Readings from Last 3 Encounters:  08/12/23 190 lb (86.2 kg)  05/09/23 192 lb 3.2 oz (87.2 kg)  03/11/23 200 lb (90.7 kg)   Lab Results  Component Value Date   CREATININE 1.55 (H) 03/11/2023   CREATININE 1.48 (H) 02/04/2023   CREATININE 1.24 (H) 01/30/2022   PHYSICAL EXAM:  General:  Well appearing. No resp difficulty HEENT: normal  Neck: supple. JVP flat. No lymphadenopathy or thryomegaly appreciated. Cor: PMI normal. Regular rhythm, bradycardic. No rubs, gallops or murmurs. Lungs: clear Abdomen: soft, nontender, nondistended. No hepatosplenomegaly. No bruits or masses.  Extremities: no cyanosis, clubbing, rash, trace pitting edema Neuro: alert & oriented x3, cranial nerves grossly intact. Moves all 4 extremities w/o difficulty. Affect pleasant.  ECG: not done   ASSESSMENT & PLAN:  1: NICM with preserved ejection fraction with LVH/LAE- - likely due to AF - NYHA class II - euvolemic today - weighing daily; reminded to call for an overnight weight gain of > 2 pounds or a weekly weight gain of > 5 pounds - weight down 2 pounds from last visit here 3 months ago - Echo 08/22/21: EF of 60-65% along with mild LVH/ LAE, mild MR and severely elevated PA pressure of 65.1 mmHg. - Echo 04/09/23: EF 65-70% along with Grade II DD, severely elevated PA pressure, mild LAE, moderate RAE and mild/ moderate TR.  - not adding salt to her food and tries to eat low sodium foods  - continue torsemide 20mg  BID with additional 20mg  PRN - continue potassium PRN - SOB worsened when taking entresto - not interested in spironolactone - continues to have issues with yeast in groin/ under breasts so not a good candidate for SGLT2 - saw cardiology Sanjuana Kava) 02/24 - BNP 12/07/21 was 371.3 - PharmD reconciled meds w/ patient  2: HTN- - BP 137/56 - seeing [facility] for primary care every 2 weeks & says that she's had labs in the last couple of months; will see if we can obtain  those results - BMP 03/11/23 reviewed and showed sodium 138, potassium 3.9, creatinine 1.55 and GFR 32 - saw nephrology Suezanne Jacquet) 03/23  3: Persistent atrial fibrillation- - saw EP Graciela Husbands) 04/23 - continue apixaban 5mg  BID - had cardioversion 01/31/22 & post cardioversion, developed Mobitz 2nd degree AV block  4: COPD- - stopped smoking fall 2022 - using trelegy daily and nebulizer PRN - saw pulmonology Karna Christmas) 03/24 - continue roflumilast daily - PFTs: 03/03/23 - FEV1 - 58%  - receiving home PT for deconditioning; currently on hold due to hot weather  Return in 6 months, sooner if needed.

## 2023-08-12 NOTE — Progress Notes (Signed)
[facility] - HEART FAILURE CLINIC - PHARMACIST COUNSELING NOTE  Guideline-Directed Medical Therapy/Evidence Based Medicine  ACE/ARB/ARNI:  None Beta Blocker:  None. Patient has a hx of AV block Aldosterone Antagonist:  None Diuretic: Torsemide 20 mg BID SGLT2i:  Not a good candidate due to GU issues.   Adherence Assessment  Do you ever forget to take your medication? [] Yes [x] No  Do you ever skip doses due to side effects? [] Yes [x] No  Do you have trouble affording your medicines? [x] Yes [] No  Are you ever unable to pick up your medication due to transportation difficulties? [] Yes [x] No  Do you ever stop taking your medications because you don't believe they are helping? [] Yes [x] No  Do you check your weight daily? [x] Yes [] No   Adherence strategy: Pill bottles in red bag.   Barriers to obtaining medications: Cost. Gave patient a patient assistance form for apixaban. Patient was approved in 2023 per son.    Vital signs: HR 42, BP 137/56, weight (pounds) 190 lb ECHO: Date 04/2023, EF 65-70, notes LV normal, Enlarged RV, severely elevated pulmonary artery pressure, LA and RA mod dilated.     Latest Ref Rng & Units 03/11/2023   10:54 AM 02/04/2023   10:51 AM 01/30/2022    9:07 AM  BMP  Glucose 70 - 99 mg/dL 409  811  914   BUN 8 - 23 mg/dL 46  48  41   Creatinine 0.44 - 1.00 mg/dL 7.82  9.56  2.13   Sodium 135 - 145 mmol/L 138  139  135   Potassium 3.5 - 5.1 mmol/L 3.9  3.3  4.0   Chloride 98 - 111 mmol/L 106  98  99   CO2 22 - 32 mmol/L 21  28  24    Calcium 8.9 - 10.3 mg/dL 7.8  8.1  8.6     Past Medical History:  Diagnosis Date   ACUT DUOD ULCER W/HEMORR&PERF W/O MENTION OBST 10/05/2009   NSAID induced   ALLERGIC RHINITIS CAUSE UNSPECIFIED    ANEMIA-NOS    CAD (coronary artery disease) 06/08/2009   DEs RCA with 70% LAD and EF 60%   CHF (congestive heart failure) (HCC)    COPD    mild obst on PFTs 03/2010   Diabetes mellitus 06/2010 dx   Mild,  diet controlled   GLAUCOMA    HYPERLIPIDEMIA    HYPERTENSION, BENIGN    MYOCARDIAL INFARCTION 06/08/2009   des to rca   Persistent atrial fibrillation (HCC)    Dx 08/2021   TOBACCO ABUSE     ASSESSMENT 89 year old female who presents to the HF clinic for a follow up appointment. PMH: afib, CAD, CHF, HTN, DM, COPD, CKD. Patient continues to have a low heart rate, not currently on a beta-blocker. Patient did have some high blood pressure reading in past office visits, but states usually blood pressure run in the 130s. No recent ED visits documented. Patient states she took an extra tablet of torsemide 08/06/23, but doesn't have to take extras often. She states 1 tablets of torsemide makes the same amount of urine as taking two. Weight is trending down.   PLAN CHF/HTN/CAD: Continue taking torsemide 20 mg BID. Patient is probably a good candidate for spironolactone. Consider adding if labs are appropriate and if blood pressure worsens. If spironolactone is started, stop supplemental potassium and possibly decrease torsemide. Spironolactone may also be beneficial in CKD, as patient is not a good candidate for a SGLT2.  Afib:  Continue apixaban 5 mg BID.    DM:  No recent A1c found. Defer to PCP for management. No diabatic agents on med rec.    Time spent: 20 minutes  Ronnald Ramp, Pharm.D. Clinical Pharmacist 08/12/2023 9:30 AM    Current Outpatient Medications:    acetaminophen (TYLENOL) 500 MG tablet, Take 500 mg by mouth every 6 (six) hours as needed. Takes two tablets in the morning and two tablets at night, Disp: , Rfl:    albuterol (VENTOLIN HFA) 108 (90 Base) MCG/ACT inhaler, INHALE ONE PUFF EVERY 6 HOURS AS NEEDED FOR SHORTNESS OF BREATH, Disp: 18 g, Rfl: 2   apixaban (ELIQUIS) 5 MG TABS tablet, TAKE ONE TABLET TWICE DAILY, Disp: 180 tablet, Rfl: 1   dorzolamide (TRUSOPT) 2 % ophthalmic solution, Place 1 drop into both eyes 2 (two) times daily., Disp: , Rfl:     Fluticasone-Umeclidin-Vilant (TRELEGY ELLIPTA) 100-62.5-25 MCG/INH AEPB, Inhale 1 puff into the lungs daily. (Patient not taking: Reported on 08/12/2023), Disp: 1 each, Rfl: 1   gabapentin (NEURONTIN) 100 MG capsule, Take 100-200 mg by mouth See admin instructions. Take 1 capsule (100mg ) by mouth every morning and take 2 capsules (200mg ) by mouth at bedtime, Disp: , Rfl:    ipratropium-albuterol (DUONEB) 0.5-2.5 (3) MG/3ML SOLN, Take 3 mLs by nebulization every 6 (six) hours as needed., Disp: 360 mL, Rfl: 1   latanoprost (XALATAN) 0.005 % ophthalmic solution, Place 1 drop into both eyes in the morning., Disp: , Rfl:    magnesium hydroxide (MILK OF MAGNESIA) 400 MG/5ML suspension, Take 30 mLs by mouth as needed for mild constipation., Disp: , Rfl:    NITROSTAT 0.4 MG SL tablet, DISSOLVE ONE TABLET UNDER TONGUE AS NEEDED FOR CHEST PAIN - MAY REPEAT TWICE-IF NO RELIEF GO TO NEAREST HOSPITAL ER, Disp: 25 tablet, Rfl: 0   nystatin (MYCOSTATIN/NYSTOP) powder, Apply 1 application topically daily., Disp: , Rfl:    potassium chloride SA (KLOR-CON M) 20 MEQ tablet, Take 1 tablet (20 mEq total) by mouth 2 (two) times daily. (Patient taking differently: Take 20 mEq by mouth as needed (leg jerking).), Disp: 60 tablet, Rfl: 3   rosuvastatin (CRESTOR) 20 MG tablet, Take 20 mg by mouth daily., Disp: , Rfl:    torsemide (DEMADEX) 20 MG tablet, Take 2 tablets (40 mg total) by mouth 2 (two) times daily. (Patient taking differently: Take 20 mg by mouth 2 (two) times daily.), Disp: 120 tablet, Rfl: 5   umeclidinium-vilanterol (ANORO ELLIPTA) 62.5-25 MCG/ACT AEPB, Inhale 1 puff into the lungs daily., Disp: , Rfl:     DRUGS TO CAUTION IN HEART FAILURE  Drug or Class Mechanism  Analgesics NSAIDs COX-2 inhibitors Glucocorticoids  Sodium and water retention, increased systemic vascular resistance, decreased response to diuretics   Diabetes Medications Metformin Thiazolidinediones Rosiglitazone (Avandia) Pioglitazone  (Actos) DPP4 Inhibitors Saxagliptin (Onglyza) Sitagliptin (Januvia)   Lactic acidosis Possible calcium channel blockade   Unknown  Antiarrhythmics Class I  Flecainide Disopyramide Class III Sotalol Other Dronedarone  Negative inotrope, proarrhythmic   Proarrhythmic, beta blockade  Negative inotrope  Antihypertensives Alpha Blockers Doxazosin Calcium Channel Blockers Diltiazem Verapamil Nifedipine Central Alpha Adrenergics Moxonidine Peripheral Vasodilators Minoxidil  Increases renin and aldosterone  Negative inotrope    Possible sympathetic withdrawal  Unknown  Anti-infective Itraconazole Amphotericin B  Negative inotrope Unknown  Hematologic Anagrelide Cilostazol   Possible inhibition of PD IV Inhibition of PD III causing arrhythmias  Neurologic/Psychiatric Stimulants Anti-Seizure Drugs Carbamazepine Pregabalin Antidepressants Tricyclics Citalopram Parkinsons Bromocriptine Pergolide Pramipexole  Antipsychotics Clozapine Antimigraine Ergotamine Methysergide Appetite suppressants Bipolar Lithium  Peripheral alpha and beta agonist activity  Negative inotrope and chronotrope Calcium channel blockade  Negative inotrope, proarrhythmic Dose-dependent QT prolongation  Excessive serotonin activity/valvular damage Excessive serotonin activity/valvular damage Unknown  IgE mediated hypersensitivy, calcium channel blockade  Excessive serotonin activity/valvular damage Excessive serotonin activity/valvular damage Valvular damage  Direct myofibrillar degeneration, adrenergic stimulation  Antimalarials Chloroquine Hydroxychloroquine Intracellular inhibition of lysosomal enzymes  Urologic Agents Alpha Blockers Doxazosin Prazosin Tamsulosin Terazosin  Increased renin and aldosterone  Adapted from Page Williemae Natter, et al. "Drugs That May Cause or Exacerbate Heart Failure: A Scientific Statement from the American Heart  Association."  Circulation 2016; 134:e32-e69. DOI: 10.1161/CIR.0000000000000426   MEDICATION ADHERENCES TIPS AND STRATEGIES Taking medication as prescribed improves patient outcomes in heart failure (reduces hospitalizations, improves symptoms, increases survival) Side effects of medications can be managed by decreasing doses, switching agents, stopping drugs, or adding additional therapy. Please let someone in the Heart Failure Clinic know if you have having bothersome side effects so we can modify your regimen. Do not alter your medication regimen without talking to [place].  Medication reminders can help patients remember to take drugs on time. If you are missing or forgetting doses you can try linking behaviors, using pill boxes, or an electronic reminder like an alarm on your phone or an app. Some people can also get automated phone calls as medication reminders.

## 2023-08-12 NOTE — Patient Instructions (Signed)
Was good to see you today, keep up the good work!  If you receive a satisfaction survey regarding the Heart Failure Clinic, please take the time to fill it out. This way we can continue to provide excellent care and make any changes that need to be made.

## 2023-08-15 DIAGNOSIS — R2681 Unsteadiness on feet: Secondary | ICD-10-CM | POA: Diagnosis not present

## 2023-08-15 DIAGNOSIS — K5900 Constipation, unspecified: Secondary | ICD-10-CM | POA: Diagnosis not present

## 2023-08-15 DIAGNOSIS — L299 Pruritus, unspecified: Secondary | ICD-10-CM | POA: Diagnosis not present

## 2023-08-15 DIAGNOSIS — J449 Chronic obstructive pulmonary disease, unspecified: Secondary | ICD-10-CM | POA: Diagnosis not present

## 2023-08-15 DIAGNOSIS — R001 Bradycardia, unspecified: Secondary | ICD-10-CM | POA: Diagnosis not present

## 2023-08-22 ENCOUNTER — Telehealth: Payer: Self-pay

## 2023-08-22 DIAGNOSIS — L299 Pruritus, unspecified: Secondary | ICD-10-CM | POA: Diagnosis not present

## 2023-08-22 DIAGNOSIS — J449 Chronic obstructive pulmonary disease, unspecified: Secondary | ICD-10-CM | POA: Diagnosis not present

## 2023-08-22 DIAGNOSIS — K5900 Constipation, unspecified: Secondary | ICD-10-CM | POA: Diagnosis not present

## 2023-08-22 DIAGNOSIS — R001 Bradycardia, unspecified: Secondary | ICD-10-CM | POA: Diagnosis not present

## 2023-08-22 DIAGNOSIS — R2681 Unsteadiness on feet: Secondary | ICD-10-CM | POA: Diagnosis not present

## 2023-08-22 DIAGNOSIS — I5032 Chronic diastolic (congestive) heart failure: Secondary | ICD-10-CM

## 2023-08-22 NOTE — Telephone Encounter (Signed)
   Pt's son stated that they did not have any labs. Son stated that will [facility] to have [facility] work completed.. Bmet and lipid panel labs placed.  Delma Freeze, FNP  P [facility] Did we ever receive these labs?       Previous Messages    ----- Message ----- From: Delma Freeze, FNP                          This is her PCP and patient and son said that she had lab work in the last 1-2 months. Can we see if a BMET and lipid panel has been done?

## 2023-08-26 ENCOUNTER — Ambulatory Visit: Payer: Medicare Other | Admitting: Podiatry

## 2023-08-27 ENCOUNTER — Encounter: Payer: Self-pay | Admitting: Podiatry

## 2023-08-27 ENCOUNTER — Ambulatory Visit (INDEPENDENT_AMBULATORY_CARE_PROVIDER_SITE_OTHER): Payer: Medicare Other | Admitting: Podiatry

## 2023-08-27 DIAGNOSIS — M79674 Pain in right toe(s): Secondary | ICD-10-CM | POA: Diagnosis not present

## 2023-08-27 DIAGNOSIS — D689 Coagulation defect, unspecified: Secondary | ICD-10-CM | POA: Diagnosis not present

## 2023-08-27 DIAGNOSIS — M79675 Pain in left toe(s): Secondary | ICD-10-CM

## 2023-08-27 DIAGNOSIS — B351 Tinea unguium: Secondary | ICD-10-CM

## 2023-08-27 DIAGNOSIS — E1151 Type 2 diabetes mellitus with diabetic peripheral angiopathy without gangrene: Secondary | ICD-10-CM | POA: Diagnosis not present

## 2023-08-27 NOTE — Progress Notes (Signed)
Subjective:  Patient ID: Jenny [name], female    DOB: Aug 29, 1933,   MRN: [id number]  Chief Complaint  Patient presents with   RFC    RFC BLOOD THINNER    89 y.o. female presents for concern of thickened elongated and painful nails that are difficult to trim. Requesting to have them trimmed today.  She has been dealing with toenail fungus for 30+ years. Does relates some pain in her feet after walking for periods.  Relates burning and tingling in their feet. Patient is diabetic and last A1c was  Lab Results  Component Value Date   HGBA1C 6.4 (H) 11/05/2021   .   PCP:  [facility]    . Denies any other pedal complaints. Denies n/v/f/c.   Past Medical History:  Diagnosis Date   ACUT DUOD ULCER W/HEMORR&PERF W/O MENTION OBST 10/05/2009   NSAID induced   ALLERGIC RHINITIS CAUSE UNSPECIFIED    ANEMIA-NOS    CAD (coronary artery disease) 06/08/2009   DEs RCA with 70% LAD and EF 60%   CHF (congestive heart failure) (HCC)    COPD    mild obst on PFTs 03/2010   Diabetes mellitus 06/2010 dx   Mild, diet controlled   GLAUCOMA    HYPERLIPIDEMIA    HYPERTENSION, BENIGN    MYOCARDIAL INFARCTION 06/08/2009   des to rca   Persistent atrial fibrillation (HCC)    Dx 08/2021   TOBACCO ABUSE     Objective:  Physical Exam: Vascular: DP/PT pulses 0/4 bilateral. CFT <3 seconds. Absent hair growth on digits. Edema noted to bilateral lower extremities. Xerosis noted bilaterally. Erythema noted to distal extremities as well.  Skin. No lacerations or abrasions bilateral feet. Nails 1-5 bilateral  are thickened discolored and elongated with subungual debris.  Musculoskeletal: MMT 5/5 bilateral lower extremities in DF, PF, Inversion and Eversion. Deceased ROM in DF of ankle joint.  Neurological: Sensation intact to light touch. Protective sensation diminished bilateral.     LOWER EXTREMITY DOPPLER STUDY  Patient Name:  Jenny [name]  Date of Exam:   01/24/2023 Medical Rec #:  [id number]      Accession #:    [id number] Date of Birth: [birth year]      Patient Gender: F Patient Age:   3 years Exam Location:  [facility] Procedure:      VAS [place] ABI WITH/WO TBI Referring Phys: Adalyne Lovick   --------------------------------------------------------------------------- -----   Indications: Patient reports years of bilateral leg pain that goes from hips              down to feet. It starts after about a block of walking. Patient              also reports the base of her spine is detoriating.  High Risk Factors: Hypertension, hyperlipidemia, Diabetes, past history of                    smoking, prior MI, coronary artery disease.  Other Factors: Afib.  Comparison Study: NA  Performing Technologist: Jeryl Columbia RDCS    Examination Guidelines: A complete evaluation includes at minimum, Doppler waveform signals and systolic blood pressure reading at the level of bilateral brachial, anterior tibial, and posterior tibial arteries, when vessel segments are accessible. Bilateral testing is considered an integral part of a complete examination. Photoelectric Plethysmograph (PPG) waveforms and toe systolic pressure readings are included as required and additional duplex testing as needed. Limited examinations for reoccurring indications may be performed  as noted.    ABI Findings: +---------+------------------+-----+--------+--------+ Right    Rt Pressure (mmHg)IndexWaveformComment  +---------+------------------+-----+--------+--------+ Brachial 142                                     +---------+------------------+-----+--------+--------+ PTA      154               1.08 biphasic         +---------+------------------+-----+--------+--------+ PERO     122               0.86 biphasic         +---------+------------------+-----+--------+--------+ DP       116               0.82 biphasic          +---------+------------------+-----+--------+--------+ Great Toe115               0.81 Normal           +---------+------------------+-----+--------+--------+  +---------+------------------+-----+--------+-------+ Left     Lt Pressure (mmHg)IndexWaveformComment +---------+------------------+-----+--------+-------+ Brachial 139                                    +---------+------------------+-----+--------+-------+ PTA      145               1.02 biphasic        +---------+------------------+-----+--------+-------+ PERO     135               0.95 biphasic        +---------+------------------+-----+--------+-------+ DP       141               0.99 biphasic        +---------+------------------+-----+--------+-------+ Great Toe146               1.03 Normal          +---------+------------------+-----+--------+-------+  +-------+-----------+-----------+------------+------------+ ABI/TBIToday's ABIToday's TBIPrevious ABIPrevious TBI +-------+-----------+-----------+------------+------------+ Right  1.08       0.81                                +-------+-----------+-----------+------------+------------+ Left   1.02       1.03                                +-------+-----------+-----------+------------+------------+          Summary: Right: Resting right ankle-brachial index is within normal range. The right toe-brachial index is normal.  Left: Resting left ankle-brachial index is within normal range. The left toe-brachial index is normal.     Assessment:   1. Pain due to onychomycosis of toenails of both feet   2. Type II diabetes mellitus with peripheral circulatory disorder (HCC)   3. Coagulation defect (HCC)       Plan:  Patient was evaluated and treated and all questions answered. -Discussed and educated patient on diabetic foot care, especially with  regards to the vascular, neurological and  musculoskeletal systems.  -Stressed the importance of good glycemic control and the detriment of not  controlling glucose levels in relation to the foot. -Discussed supportive shoes at all times and checking feet regularly.  -Mechanically debrided all nails 1-5 bilateral using sterile nail  nipper and filed with dremel without incident   -Answered all patient questions -Patient to return  in 3 months for at risk foot care -Patient advised to call the office if any problems or questions arise in the meantime.   Louann Sjogren, DPM

## 2023-08-28 DIAGNOSIS — E785 Hyperlipidemia, unspecified: Secondary | ICD-10-CM | POA: Diagnosis not present

## 2023-08-28 DIAGNOSIS — I5030 Unspecified diastolic (congestive) heart failure: Secondary | ICD-10-CM | POA: Diagnosis not present

## 2023-08-28 DIAGNOSIS — J449 Chronic obstructive pulmonary disease, unspecified: Secondary | ICD-10-CM | POA: Diagnosis not present

## 2023-08-28 DIAGNOSIS — R54 Age-related physical debility: Secondary | ICD-10-CM | POA: Diagnosis not present

## 2023-08-28 DIAGNOSIS — I130 Hypertensive heart and chronic kidney disease with heart failure and stage 1 through stage 4 chronic kidney disease, or unspecified chronic kidney disease: Secondary | ICD-10-CM | POA: Diagnosis not present

## 2023-08-28 DIAGNOSIS — N184 Chronic kidney disease, stage 4 (severe): Secondary | ICD-10-CM | POA: Diagnosis not present

## 2023-08-28 DIAGNOSIS — D649 Anemia, unspecified: Secondary | ICD-10-CM | POA: Diagnosis not present

## 2023-08-28 DIAGNOSIS — F331 Major depressive disorder, recurrent, moderate: Secondary | ICD-10-CM | POA: Diagnosis not present

## 2023-08-28 DIAGNOSIS — E1142 Type 2 diabetes mellitus with diabetic polyneuropathy: Secondary | ICD-10-CM | POA: Diagnosis not present

## 2023-08-28 DIAGNOSIS — E1122 Type 2 diabetes mellitus with diabetic chronic kidney disease: Secondary | ICD-10-CM | POA: Diagnosis not present

## 2023-08-29 ENCOUNTER — Telehealth: Payer: Self-pay | Admitting: Pharmacist

## 2023-08-29 NOTE — Telephone Encounter (Signed)
Patient's son, Casimiro Needle, brought by patient assistance application for Eliquis along with a denial letter from [facility] [employer]. Application has been signed and submitted to [employer] for review.

## 2023-09-01 DIAGNOSIS — H2512 Age-related nuclear cataract, left eye: Secondary | ICD-10-CM | POA: Diagnosis not present

## 2023-09-01 DIAGNOSIS — H402221 Chronic angle-closure glaucoma, left eye, mild stage: Secondary | ICD-10-CM | POA: Diagnosis not present

## 2023-09-08 DIAGNOSIS — J449 Chronic obstructive pulmonary disease, unspecified: Secondary | ICD-10-CM | POA: Diagnosis not present

## 2023-09-09 ENCOUNTER — Telehealth: Payer: Self-pay

## 2023-09-09 DIAGNOSIS — I2089 Other forms of angina pectoris: Secondary | ICD-10-CM | POA: Diagnosis not present

## 2023-09-09 DIAGNOSIS — J449 Chronic obstructive pulmonary disease, unspecified: Secondary | ICD-10-CM | POA: Diagnosis not present

## 2023-09-09 NOTE — Telephone Encounter (Signed)
Pt called and lvm stating she wasn't told to get her blood work done the day of her last ofv 08/12/23. Pt didn't klnow if she needed paperwork first to get labs done. [facility]ified [facility]. Pt had good understanding and no further questions.

## 2023-09-18 ENCOUNTER — Other Ambulatory Visit
Admission: RE | Admit: 2023-09-18 | Discharge: 2023-09-18 | Disposition: A | Discharge status: Home / Self Care | Payer: Medicare Other | Source: Ambulatory Visit | Attending: Family | Admitting: Family

## 2023-09-18 DIAGNOSIS — I5032 Chronic diastolic (congestive) heart failure: Secondary | ICD-10-CM | POA: Insufficient documentation

## 2023-09-18 LAB — BASIC METABOLIC PANEL
Anion gap: 10 (ref 5–15)
BUN: 54 mg/dL — ABNORMAL HIGH (ref 8–23)
CO2: 25 mmol/L (ref 22–32)
Calcium: 7.7 mg/dL — ABNORMAL LOW (ref 8.9–10.3)
Chloride: 98 mmol/L (ref 98–111)
Creatinine, Ser: 1.2 mg/dL — ABNORMAL HIGH (ref 0.44–1.00)
GFR, Estimated: 44 mL/min — ABNORMAL LOW (ref >60)
Glucose, Bld: 95 mg/dL (ref 70–99)
Potassium: 4.3 mmol/L (ref 3.5–5.1)
Sodium: 133 mmol/L — ABNORMAL LOW (ref 135–145)

## 2023-09-18 LAB — LIPID PANEL
Cholesterol: 170 mg/dL (ref 0–200)
HDL: 94 mg/dL (ref >40)
LDL Cholesterol: 37 mg/dL (ref 0–99)
Total CHOL/HDL Ratio: 1.8 {ratio}
Triglycerides: 197 mg/dL — ABNORMAL HIGH (ref <150)
VLDL: 39 mg/dL (ref 0–40)

## 2023-09-21 DIAGNOSIS — J449 Chronic obstructive pulmonary disease, unspecified: Secondary | ICD-10-CM | POA: Diagnosis not present

## 2023-09-25 DIAGNOSIS — R195 Other fecal abnormalities: Secondary | ICD-10-CM | POA: Diagnosis not present

## 2023-09-25 DIAGNOSIS — R0602 Shortness of breath: Secondary | ICD-10-CM | POA: Diagnosis not present

## 2023-09-25 DIAGNOSIS — I5030 Unspecified diastolic (congestive) heart failure: Secondary | ICD-10-CM | POA: Diagnosis not present

## 2023-09-25 DIAGNOSIS — I110 Hypertensive heart disease with heart failure: Secondary | ICD-10-CM | POA: Diagnosis not present

## 2023-09-25 DIAGNOSIS — J449 Chronic obstructive pulmonary disease, unspecified: Secondary | ICD-10-CM | POA: Diagnosis not present

## 2023-09-25 DIAGNOSIS — Z8719 Personal history of other diseases of the digestive system: Secondary | ICD-10-CM | POA: Diagnosis not present

## 2023-09-26 ENCOUNTER — Emergency Department: Payer: Medicare Other

## 2023-09-26 ENCOUNTER — Other Ambulatory Visit: Payer: Self-pay

## 2023-09-26 ENCOUNTER — Inpatient Hospital Stay
Admission: EM | Admit: 2023-09-26 | Discharge: 2023-09-30 | DRG: 291 | Disposition: A | Discharge status: Home / Self Care | Payer: Medicare Other | Attending: Internal Medicine | Admitting: Internal Medicine

## 2023-09-26 DIAGNOSIS — Z87891 Personal history of nicotine dependence: Secondary | ICD-10-CM | POA: Diagnosis not present

## 2023-09-26 DIAGNOSIS — I2510 Atherosclerotic heart disease of native coronary artery without angina pectoris: Secondary | ICD-10-CM | POA: Diagnosis not present

## 2023-09-26 DIAGNOSIS — I272 Pulmonary hypertension, unspecified: Secondary | ICD-10-CM | POA: Diagnosis present

## 2023-09-26 DIAGNOSIS — R0602 Shortness of breath: Secondary | ICD-10-CM | POA: Diagnosis not present

## 2023-09-26 DIAGNOSIS — I495 Sick sinus syndrome: Secondary | ICD-10-CM | POA: Diagnosis not present

## 2023-09-26 DIAGNOSIS — I4819 Other persistent atrial fibrillation: Secondary | ICD-10-CM | POA: Diagnosis present

## 2023-09-26 DIAGNOSIS — J811 Chronic pulmonary edema: Secondary | ICD-10-CM | POA: Diagnosis not present

## 2023-09-26 DIAGNOSIS — Z886 Allergy status to analgesic agent status: Secondary | ICD-10-CM

## 2023-09-26 DIAGNOSIS — I440 Atrioventricular block, first degree: Secondary | ICD-10-CM | POA: Diagnosis present

## 2023-09-26 DIAGNOSIS — D649 Anemia, unspecified: Secondary | ICD-10-CM | POA: Diagnosis present

## 2023-09-26 DIAGNOSIS — Z7951 Long term (current) use of inhaled steroids: Secondary | ICD-10-CM

## 2023-09-26 DIAGNOSIS — I4892 Unspecified atrial flutter: Secondary | ICD-10-CM | POA: Diagnosis present

## 2023-09-26 DIAGNOSIS — E785 Hyperlipidemia, unspecified: Secondary | ICD-10-CM | POA: Diagnosis present

## 2023-09-26 DIAGNOSIS — Z9861 Coronary angioplasty status: Secondary | ICD-10-CM

## 2023-09-26 DIAGNOSIS — J449 Chronic obstructive pulmonary disease, unspecified: Secondary | ICD-10-CM | POA: Diagnosis not present

## 2023-09-26 DIAGNOSIS — N1832 Chronic kidney disease, stage 3b: Secondary | ICD-10-CM | POA: Diagnosis present

## 2023-09-26 DIAGNOSIS — E739 Lactose intolerance, unspecified: Secondary | ICD-10-CM | POA: Diagnosis present

## 2023-09-26 DIAGNOSIS — E1122 Type 2 diabetes mellitus with diabetic chronic kidney disease: Secondary | ICD-10-CM | POA: Diagnosis present

## 2023-09-26 DIAGNOSIS — H409 Unspecified glaucoma: Secondary | ICD-10-CM | POA: Diagnosis not present

## 2023-09-26 DIAGNOSIS — Z800 Family history of malignant neoplasm of digestive organs: Secondary | ICD-10-CM

## 2023-09-26 DIAGNOSIS — Z79899 Other long term (current) drug therapy: Secondary | ICD-10-CM

## 2023-09-26 DIAGNOSIS — I444 Left anterior fascicular block: Secondary | ICD-10-CM | POA: Diagnosis present

## 2023-09-26 DIAGNOSIS — R001 Bradycardia, unspecified: Secondary | ICD-10-CM | POA: Diagnosis not present

## 2023-09-26 DIAGNOSIS — E119 Type 2 diabetes mellitus without complications: Secondary | ICD-10-CM | POA: Diagnosis not present

## 2023-09-26 DIAGNOSIS — I509 Heart failure, unspecified: Secondary | ICD-10-CM | POA: Diagnosis not present

## 2023-09-26 DIAGNOSIS — Z950 Presence of cardiac pacemaker: Secondary | ICD-10-CM

## 2023-09-26 DIAGNOSIS — I5032 Chronic diastolic (congestive) heart failure: Secondary | ICD-10-CM | POA: Diagnosis present

## 2023-09-26 DIAGNOSIS — I5033 Acute on chronic diastolic (congestive) heart failure: Secondary | ICD-10-CM | POA: Diagnosis not present

## 2023-09-26 DIAGNOSIS — I498 Other specified cardiac arrhythmias: Secondary | ICD-10-CM | POA: Diagnosis not present

## 2023-09-26 DIAGNOSIS — Z8249 Family history of ischemic heart disease and other diseases of the circulatory system: Secondary | ICD-10-CM | POA: Diagnosis not present

## 2023-09-26 DIAGNOSIS — I10 Essential (primary) hypertension: Secondary | ICD-10-CM | POA: Diagnosis present

## 2023-09-26 DIAGNOSIS — I252 Old myocardial infarction: Secondary | ICD-10-CM | POA: Diagnosis not present

## 2023-09-26 DIAGNOSIS — Z823 Family history of stroke: Secondary | ICD-10-CM

## 2023-09-26 DIAGNOSIS — E66811 Obesity, class 1: Secondary | ICD-10-CM | POA: Diagnosis present

## 2023-09-26 DIAGNOSIS — I130 Hypertensive heart and chronic kidney disease with heart failure and stage 1 through stage 4 chronic kidney disease, or unspecified chronic kidney disease: Secondary | ICD-10-CM | POA: Diagnosis not present

## 2023-09-26 DIAGNOSIS — D508 Other iron deficiency anemias: Secondary | ICD-10-CM | POA: Diagnosis not present

## 2023-09-26 DIAGNOSIS — Z7901 Long term (current) use of anticoagulants: Secondary | ICD-10-CM | POA: Diagnosis not present

## 2023-09-26 DIAGNOSIS — Z7952 Long term (current) use of systemic steroids: Secondary | ICD-10-CM

## 2023-09-26 DIAGNOSIS — D631 Anemia in chronic kidney disease: Secondary | ICD-10-CM | POA: Diagnosis present

## 2023-09-26 DIAGNOSIS — I480 Paroxysmal atrial fibrillation: Secondary | ICD-10-CM | POA: Diagnosis present

## 2023-09-26 DIAGNOSIS — I700 Atherosclerosis of aorta: Secondary | ICD-10-CM | POA: Diagnosis not present

## 2023-09-26 DIAGNOSIS — I110 Hypertensive heart disease with heart failure: Secondary | ICD-10-CM | POA: Diagnosis not present

## 2023-09-26 LAB — HEPATIC FUNCTION PANEL
ALT: 47 U/L — ABNORMAL HIGH (ref 0–44)
AST: 31 U/L (ref 15–41)
Albumin: 3.5 g/dL (ref 3.5–5.0)
Alkaline Phosphatase: 91 U/L (ref 38–126)
Bilirubin, Direct: 0.3 mg/dL — ABNORMAL HIGH (ref 0.0–0.2)
Indirect Bilirubin: 0.7 mg/dL (ref 0.3–0.9)
Total Bilirubin: 1 mg/dL (ref 0.3–1.2)
Total Protein: 6.6 g/dL (ref 6.5–8.1)

## 2023-09-26 LAB — BASIC METABOLIC PANEL
Anion gap: 11 (ref 5–15)
BUN: 37 mg/dL — ABNORMAL HIGH (ref 8–23)
CO2: 23 mmol/L (ref 22–32)
Calcium: 8.6 mg/dL — ABNORMAL LOW (ref 8.9–10.3)
Chloride: 105 mmol/L (ref 98–111)
Creatinine, Ser: 1.07 mg/dL — ABNORMAL HIGH (ref 0.44–1.00)
GFR, Estimated: 50 mL/min — ABNORMAL LOW (ref >60)
Glucose, Bld: 110 mg/dL — ABNORMAL HIGH (ref 70–99)
Potassium: 4.5 mmol/L (ref 3.5–5.1)
Sodium: 139 mmol/L (ref 135–145)

## 2023-09-26 LAB — FOLATE: Folate: 7.4 ng/mL (ref >5.9)

## 2023-09-26 LAB — TROPONIN I (HIGH SENSITIVITY)
Troponin I (High Sensitivity): 29 ng/L — ABNORMAL HIGH (ref <18)
Troponin I (High Sensitivity): 27 ng/L — ABNORMAL HIGH
Troponin I (High Sensitivity): 27 ng/L — ABNORMAL HIGH (ref <18)

## 2023-09-26 LAB — IRON AND TIBC
Iron: 26 ug/dL — ABNORMAL LOW (ref 28–170)
Saturation Ratios: 5 % — ABNORMAL LOW (ref 10.4–31.8)
TIBC: 542 ug/dL — ABNORMAL HIGH (ref 250–450)
UIBC: 516 ug/dL

## 2023-09-26 LAB — RETICULOCYTES
Immature Retic Fract: 38.2 % — ABNORMAL HIGH (ref 2.3–15.9)
RBC.: 3.93 MIL/uL (ref 3.87–5.11)
Retic Count, Absolute: 106.5 10*3/uL (ref 19.0–186.0)
Retic Ct Pct: 2.7 % (ref 0.4–3.1)

## 2023-09-26 LAB — TYPE AND SCREEN
ABO/RH(D): O POS
Antibody Screen: NEGATIVE

## 2023-09-26 LAB — CBC
HCT: 32.2 % — ABNORMAL LOW (ref 36.0–46.0)
Hemoglobin: 9.3 g/dL — ABNORMAL LOW (ref 12.0–15.0)
MCH: 23.7 pg — ABNORMAL LOW (ref 26.0–34.0)
MCHC: 28.9 g/dL — ABNORMAL LOW (ref 30.0–36.0)
MCV: 81.9 fL (ref 80.0–100.0)
Platelets: 279 10*3/uL (ref 150–400)
RBC: 3.93 MIL/uL (ref 3.87–5.11)
RDW: 18.6 % — ABNORMAL HIGH (ref 11.5–15.5)
WBC: 8.8 10*3/uL (ref 4.0–10.5)
nRBC: 0 % (ref 0.0–0.2)

## 2023-09-26 LAB — PROTIME-INR
INR: 1.3 — ABNORMAL HIGH (ref 0.8–1.2)
Prothrombin Time: 16.7 s — ABNORMAL HIGH (ref 11.4–15.2)

## 2023-09-26 LAB — APTT: aPTT: 31 s (ref 24–36)

## 2023-09-26 LAB — FERRITIN: Ferritin: 10 ng/mL — ABNORMAL LOW (ref 11–307)

## 2023-09-26 LAB — VITAMIN B12: Vitamin B-12: 556 pg/mL (ref 180–914)

## 2023-09-26 LAB — HEMOGLOBIN A1C
Hgb A1c MFr Bld: 6.9 % — ABNORMAL HIGH (ref 4.8–5.6)
Mean Plasma Glucose: 151.33 mg/dL

## 2023-09-26 LAB — BRAIN NATRIURETIC PEPTIDE: B Natriuretic Peptide: 441.2 pg/mL — ABNORMAL HIGH (ref 0.0–100.0)

## 2023-09-26 MED ORDER — NITROGLYCERIN 0.4 MG SL SUBL: 0.4000 mg | SUBLINGUAL_TABLET | SUBLINGUAL | Status: DC | PRN

## 2023-09-26 MED ORDER — ONDANSETRON HCL 4 MG/2ML IJ SOLN: 4.0000 mg | Freq: Four times a day (QID) | INTRAMUSCULAR | Status: DC | PRN

## 2023-09-26 MED ORDER — UMECLIDINIUM-VILANTEROL 62.5-25 MCG/ACT IN AEPB
1.0000 | INHALATION_SPRAY | Freq: Every day | RESPIRATORY_TRACT | Status: DC
  Administered 2023-09-27 – 2023-09-30 (×4): 1 via RESPIRATORY_TRACT
  Filled 2023-09-26 (×2): qty 14

## 2023-09-26 MED ORDER — SODIUM CHLORIDE 0.9% FLUSH: 3.0000 mL | INTRAVENOUS | Status: DC | PRN

## 2023-09-26 MED ORDER — IPRATROPIUM-ALBUTEROL 0.5-2.5 (3) MG/3ML IN SOLN
6.0000 mL | Freq: Once | RESPIRATORY_TRACT | Status: AC
  Administered 2023-09-26: 6 mL via RESPIRATORY_TRACT
  Filled 2023-09-26: qty 6

## 2023-09-26 MED ORDER — PANTOPRAZOLE SODIUM 40 MG IV SOLR
40.0000 mg | Freq: Once | INTRAVENOUS | Status: AC
  Administered 2023-09-26: 40 mg via INTRAVENOUS
  Filled 2023-09-26: qty 10

## 2023-09-26 MED ORDER — FUROSEMIDE 10 MG/ML IJ SOLN
40.0000 mg | Freq: Once | INTRAMUSCULAR | Status: AC
  Administered 2023-09-26: 40 mg via INTRAVENOUS
  Filled 2023-09-26: qty 4

## 2023-09-26 MED ORDER — GABAPENTIN 100 MG PO CAPS
100.0000 mg | ORAL_CAPSULE | Freq: Every morning | ORAL | Status: DC
  Administered 2023-09-27 – 2023-09-30 (×4): 100 mg via ORAL
  Filled 2023-09-26 (×4): qty 1

## 2023-09-26 MED ORDER — DORZOLAMIDE HCL 2 % OP SOLN
1.0000 [drp] | Freq: Two times a day (BID) | OPHTHALMIC | Status: DC
  Administered 2023-09-27 – 2023-09-30 (×7): 1 [drp] via OPHTHALMIC
  Filled 2023-09-26 (×3): qty 10

## 2023-09-26 MED ORDER — ROSUVASTATIN CALCIUM 10 MG PO TABS
20.0000 mg | ORAL_TABLET | Freq: Every day | ORAL | Status: DC
  Administered 2023-09-28 – 2023-09-30 (×3): 20 mg via ORAL
  Filled 2023-09-26 (×4): qty 2

## 2023-09-26 MED ORDER — ACETAMINOPHEN 650 MG RE SUPP: 650.0000 mg | Freq: Four times a day (QID) | RECTAL | Status: DC | PRN

## 2023-09-26 MED ORDER — MAGNESIUM HYDROXIDE 400 MG/5ML PO SUSP: 30.0000 mL | ORAL | Status: DC | PRN

## 2023-09-26 MED ORDER — ACETAMINOPHEN 325 MG PO TABS
650.0000 mg | ORAL_TABLET | Freq: Four times a day (QID) | ORAL | Status: DC | PRN
  Administered 2023-09-26 – 2023-09-30 (×3): 650 mg via ORAL
  Filled 2023-09-26 (×3): qty 2

## 2023-09-26 MED ORDER — FUROSEMIDE 10 MG/ML IJ SOLN
40.0000 mg | Freq: Every day | INTRAMUSCULAR | Status: DC
  Administered 2023-09-27: 40 mg via INTRAVENOUS
  Filled 2023-09-26: qty 4

## 2023-09-26 MED ORDER — POTASSIUM CHLORIDE CRYS ER 20 MEQ PO TBCR
20.0000 meq | EXTENDED_RELEASE_TABLET | Freq: Once | ORAL | Status: AC
  Administered 2023-09-26: 20 meq via ORAL
  Filled 2023-09-26: qty 1

## 2023-09-26 MED ORDER — HYDRALAZINE HCL 20 MG/ML IJ SOLN: 10.0000 mg | Freq: Four times a day (QID) | INTRAMUSCULAR | Status: DC | PRN

## 2023-09-26 MED ORDER — GABAPENTIN 100 MG PO CAPS: 100.0000 mg | ORAL_CAPSULE | ORAL | Status: DC

## 2023-09-26 MED ORDER — LATANOPROST 0.005 % OP SOLN
1.0000 [drp] | Freq: Every morning | OPHTHALMIC | Status: DC
  Administered 2023-09-27 – 2023-09-30 (×4): 1 [drp] via OPHTHALMIC
  Filled 2023-09-26 (×2): qty 2.5

## 2023-09-26 MED ORDER — ONDANSETRON HCL 4 MG PO TABS: 4.0000 mg | ORAL_TABLET | Freq: Four times a day (QID) | ORAL | Status: DC | PRN

## 2023-09-26 MED ORDER — SODIUM CHLORIDE 0.9% FLUSH
3.0000 mL | Freq: Two times a day (BID) | INTRAVENOUS | Status: DC
  Administered 2023-09-26 – 2023-09-28 (×4): 3 mL via INTRAVENOUS

## 2023-09-26 MED ORDER — SODIUM CHLORIDE 0.9% FLUSH
10.0000 mL | Freq: Two times a day (BID) | INTRAVENOUS | Status: DC
  Administered 2023-09-28 – 2023-09-29 (×3): 10 mL via INTRAVENOUS

## 2023-09-26 MED ORDER — ENOXAPARIN SODIUM 40 MG/0.4ML IJ SOSY
40.0000 mg | PREFILLED_SYRINGE | INTRAMUSCULAR | Status: DC
  Administered 2023-09-27: 40 mg via SUBCUTANEOUS
  Filled 2023-09-26: qty 0.4

## 2023-09-26 MED ORDER — GABAPENTIN 100 MG PO CAPS
200.0000 mg | ORAL_CAPSULE | Freq: Every day | ORAL | Status: DC
  Administered 2023-09-26 – 2023-09-29 (×3): 200 mg via ORAL
  Filled 2023-09-26 (×4): qty 2

## 2023-09-26 NOTE — H&P (Signed)
History and Physical    Patient: Jenny [name] MWU:[id number] DOB: [birth year] DOA: 09/26/2023 DOS: the patient was seen and examined on 09/26/2023 PCP: [facility]  Patient coming from: Home  Chief Complaint:  Chief Complaint  Patient presents with   Shortness of Breath   HPI: ALYSE SCHNAUTZ is a 89 y.o. female with medical history significant of  HFpEF, atrial fibrillation on Eliquis, COPD, hypertension, hyperlipidemia, CAD, presents to the emergency department with worsening shortness of breath.   Per patient she had a chest pain 2 days ago which resolved after taking 3 doses of sublingual nitroglycerin at home.  She was not sure whether it was cardiac or related to her GERD.  No associated palpitations, nausea or vomiting.  She was having worsening dyspnea mostly with exertion over the duration of couple of weeks.  No recent illnesses.  No chest pain.  No upper respiratory symptoms.  Per patient she noticed black-colored stool over the weekend couple of times, no diarrhea.  She has an history of constipation.  Last bowel movement was on Wednesday.  No recent change in appetite or bowel habits.  No urinary symptoms.  ED course and data reviewed.  Patient was having mildly elevated blood pressure on presentation, rest of the vital stable.  Labs pertinent for hemoglobin of 9.3 with baseline above 13 but it was more than a year ago, renal function stable and at baseline.  BNP elevated at 441.  Troponin ordered and pending. Chest x-ray with concern of pulmonary vascular congestion  EKG with bradycardia and concern of junctional rhythm.  Patient was given a dose of IV Lasix, cardiology was also consulted.   Review of Systems: As mentioned in the history of present illness. All other systems reviewed and are negative. Past Medical History:  Diagnosis Date   ACUT DUOD ULCER W/HEMORR&PERF W/O MENTION OBST 10/05/2009   NSAID induced   ALLERGIC RHINITIS CAUSE UNSPECIFIED     ANEMIA-NOS    CAD (coronary artery disease) 06/08/2009   DEs RCA with 70% LAD and EF 60%   CHF (congestive heart failure) (HCC)    COPD    mild obst on PFTs 03/2010   Diabetes mellitus 06/2010 dx   Mild, diet controlled   GLAUCOMA    HYPERLIPIDEMIA    HYPERTENSION, BENIGN    MYOCARDIAL INFARCTION 06/08/2009   des to rca   Persistent atrial fibrillation (HCC)    Dx 08/2021   TOBACCO ABUSE    Past Surgical History:  Procedure Laterality Date   CARDIOVERSION N/A 01/30/2022   Procedure: CARDIOVERSION;  Surgeon: Elease Hashimoto Deloris Ping, MD;  Location: [facility] ENDOSCOPY;  Service: Cardiovascular;  Laterality: N/A;   HEMORRHOID SURGERY  1990   Right knee surgery     Social History:  reports that she quit smoking about 22 months ago. Her smoking use included cigarettes. She started smoking about 61 years ago. She has a 30 pack-year smoking history. She has never used smokeless tobacco. She reports current alcohol use of about 1.0 - 2.0 standard drink of alcohol per week. She reports that she does not use drugs.  Allergies  Allergen Reactions   Entresto [Sacubitril-Valsartan] Shortness Of Breath    COPD and reports increased SOB with Entresto   Aspirin Other (See Comments)    Can take 81 mg not 325 mg   Atorvastatin Itching    Itching and myaliga   Cyclobenzaprine     Other reaction(s): muscle relaxers-ulcer hemorrhage (hospitalized)   Lactose Intolerance (Gi)  Other (See Comments)    GI upset   Meperidine Hcl Other (See Comments)    Not known   Pravastatin Rash    rash   Propoxyphene     Other reaction(s): pain meds, hallucination, vomiting   Wound Dressing Adhesive     Other reaction(s): red, stings    Family History  Problem Relation Age of Onset   Hypertension Mother    Ulcers Mother    Stomach cancer Father    Stroke Maternal Grandmother    Heart attack Neg Hx     Prior to Admission medications   Medication Sig Start Date End Date Taking? Authorizing Provider  hydrOXYzine  (ATARAX) 25 MG tablet Take 25 mg by mouth every 8 (eight) hours as needed. 06/06/22  Yes [provider]  ipratropium-albuterol (DUONEB) 0.5-2.5 (3) MG/3ML SOLN Take 3 mLs by nebulization every 6 (six) hours as needed. 11/06/21  Yes Enedina Finner, MD  NITROSTAT 0.4 MG SL tablet DISSOLVE ONE TABLET UNDER TONGUE AS NEEDED FOR CHEST PAIN - MAY REPEAT TWICE-IF NO RELIEF GO TO NEAREST HOSPITAL ER 05/06/13  Yes Newt Lukes, MD  predniSONE (DELTASONE) 20 MG tablet Take 20 mg by mouth daily with breakfast. 09/11/23  Yes [provider]  acetaminophen (TYLENOL) 500 MG tablet Take 500 mg by mouth every 6 (six) hours as needed. Takes two tablets in the morning and two tablets at night    [provider]  albuterol (VENTOLIN HFA) 108 (90 Base) MCG/ACT inhaler INHALE ONE PUFF EVERY 6 HOURS AS NEEDED FOR SHORTNESS OF BREATH 11/03/21   Gilles Chiquito, MD  apixaban (ELIQUIS) 5 MG TABS tablet TAKE ONE TABLET TWICE DAILY 03/10/23   Kathleene Hazel, MD  dorzolamide (TRUSOPT) 2 % ophthalmic solution Place 1 drop into both eyes 2 (two) times daily.    [provider]  Fluticasone-Umeclidin-Vilant (TRELEGY ELLIPTA) 100-62.5-25 MCG/INH AEPB Inhale 1 puff into the lungs daily. Patient not taking: Reported on 08/12/2023 08/27/21   Almon Hercules, MD  gabapentin (NEURONTIN) 100 MG capsule Take 100-200 mg by mouth See admin instructions. Take 1 capsule (100mg ) by mouth every morning and take 2 capsules (200mg ) by mouth at bedtime    [provider]  latanoprost (XALATAN) 0.005 % ophthalmic solution Place 1 drop into both eyes in the morning.    [provider]  magnesium hydroxide (MILK OF MAGNESIA) 400 MG/5ML suspension Take 30 mLs by mouth as needed for mild constipation.    [provider]  nystatin (MYCOSTATIN/NYSTOP) powder Apply 1 application topically daily. 01/22/22   [provider]  potassium chloride SA (KLOR-CON M) 20 MEQ tablet Take 1  tablet (20 mEq total) by mouth 2 (two) times daily. Patient taking differently: Take 20 mEq by mouth as needed (leg jerking). 02/04/23   Delma Freeze, FNP  rosuvastatin (CRESTOR) 20 MG tablet Take 20 mg by mouth daily.    [provider]  torsemide (DEMADEX) 20 MG tablet Take 2 tablets (40 mg total) by mouth 2 (two) times daily. Patient taking differently: Take 20 mg by mouth 2 (two) times daily. 02/04/23   Delma Freeze, FNP  umeclidinium-vilanterol (ANORO ELLIPTA) 62.5-25 MCG/ACT AEPB Inhale 1 puff into the lungs daily.    [provider]    Physical Exam: Vitals:   09/26/23 1500 09/26/23 1515 09/26/23 1518 09/26/23 1530  BP: (!) 172/66     Pulse: (!) 38 (!) 39  74  Resp:    17  Temp:  97.8 F (36.6 C)   SpO2: 100% 100%  100%  Weight:      Height:        General: Vital signs reviewed.  Patient is well-developed and well-nourished, in no acute distress and cooperative with exam.  Head: Normocephalic and atraumatic. Eyes: EOMI, conjunctivae normal, no scleral icterus.  Neck: Supple, trachea midline, normal ROM,  Cardiovascular: Bradycardia Pulmonary/Chest: Few basal crackles bilaterally, no wheezes,  Abdominal: Soft, non-tender, non-distended, BS +,  Extremities: No lower extremity edema bilaterally,  pulses symmetric and intact bilaterally. No cyanosis or clubbing. Neurological: A&O x3, Strength is normal and symmetric bilaterally, cranial nerve II-XII are grossly intact, no focal motor deficit, sensory intact to light touch bilaterally.  Psychiatric: Normal mood and affect.   Data Reviewed: Prior data reviewed as mentioned above  Assessment and Plan: * Acute on chronic heart failure with preserved ejection fraction (HFpEF) (HCC) Patient with worsening shortness of breath, chest x-ray with concern of pulmonary vascular congestion, BNP elevated at 441. No peripheral edema. -Admit to progressive -IV Lasix 40 mg daily -Strict intake and output -Daily  weight and BNP -Cardiology consult  Tachycardia-bradycardia syndrome (HCC) Currently heart rate mostly in high 30s, EKG with concern of junctional rhythm. Not on any nodal blocking agent. -Cardiology consult  Normocytic anemia Patient with concern of black-colored stool over the weekend.  No diarrhea.  ED provider exam was without any obvious bleeding.  Last BM was on Wednesday.  Patient was on Eliquis Current hemoglobin 9.3, prior check was 13 which was 1 year ago. -Check anemia panel -Monitor for any bleeding -Holding Eliquis  CAD, NATIVE VESSEL Patient had chest pain 2 days ago which resolved with 3 doses of nitroglycerin. -Troponin ordered   Essential hypertension Blood pressure currently elevated.  Not on any antihypertensives at home. -As needed hydralazine  Stage 3b chronic kidney disease (HCC) Hemoglobin seems stable around baseline. -Monitor renal function while she is being diuresed -Avoid nephrotoxins  Type 2 diabetes, diet controlled (HCC) Not on any antidiabetic at home. -Check A1c -Monitor daily CBG  COPD (chronic obstructive pulmonary disease) (HCC) No concern of exacerbation. -Continue home bronchodilator  HLD (hyperlipidemia) -Continue home statin  Paroxysmal atrial fibrillation (HCC) Currently having bradycardia and in junctional rhythm. Not on any rate limiting medication at home -Holding home Eliquis for concern of GI bleeding  Unspecified glaucoma -Continue home eyedrops   Advance Care Planning:   Code Status: Full Code discussed with patient  Consults: Cardiology  Family Communication:   Severity of Illness: The appropriate patient status for this patient is INPATIENT. Inpatient status is judged to be reasonable and necessary in order to provide the required intensity of service to ensure the patient's safety. The patient's presenting symptoms, physical exam findings, and initial radiographic and laboratory data in the context of their  chronic comorbidities is felt to place them at high risk for further clinical deterioration. Furthermore, it is not anticipated that the patient will be medically stable for discharge from the hospital within 2 midnights of admission.   * I certify that at the point of admission it is my clinical judgment that the patient will require inpatient hospital care spanning beyond 2 midnights from the point of admission due to high intensity of service, high risk for further deterioration and high frequency of surveillance required.*  This record has been created using Conservation officer, historic buildings. Errors have been sought and corrected,but may not always be located. Such creation errors do not reflect on the standard  of care.   Author: Arnetha Courser, MD 09/26/2023 5:10 PM  For on call review [email or web address].
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
## 2023-09-26 NOTE — Plan of Care (Signed)
CHL Tonsillectomy/Adenoidectomy, Postoperative PEDS care plan entered in error.

## 2023-09-26 NOTE — Assessment & Plan Note (Signed)
Currently having bradycardia and in junctional rhythm. Not on any rate limiting medication at home -Holding home Eliquis for concern of GI bleeding

## 2023-09-26 NOTE — Assessment & Plan Note (Signed)
-

## 2023-09-26 NOTE — Assessment & Plan Note (Signed)
No concern of exacerbation. -Continue home bronchodilator
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
## 2023-09-26 NOTE — Assessment & Plan Note (Signed)
Not on any antidiabetic at home. -Check A1c -Monitor daily CBG

## 2023-09-26 NOTE — Assessment & Plan Note (Signed)
Blood pressure currently elevated.  Not on any antihypertensives at home. -As needed hydralazine

## 2023-09-26 NOTE — Assessment & Plan Note (Signed)
Patient had chest pain 2 days ago which resolved with 3 doses of nitroglycerin. -Troponin ordered

## 2023-09-26 NOTE — Assessment & Plan Note (Signed)
Patient with concern of black-colored stool over the weekend.  No diarrhea.  ED provider exam was without any obvious bleeding.  Last BM was on Wednesday.  Patient was on Eliquis Current hemoglobin stable at 9.3>>9.5, prior check was 13 which was 1 year ago. -Check anemia panel-concern of iron deficiency anemia -Starting on supplement -Also ordered 1 dose of IV iron as recommended by cardiology -Monitor for any bleeding -Holding Eliquis-cardiology will resume in 1 to 2 weeks as outpatient if hemoglobin remains stable and upward trending

## 2023-09-26 NOTE — ED Triage Notes (Signed)
Pt to ED For shob for months. Hx CHF.  Dark stools started over weekend.  Has not been taking torsemide the past few days d/t SHOB.

## 2023-09-26 NOTE — Assessment & Plan Note (Signed)
Slight worsening of creatinine but remained within baseline. -Monitor renal function while she is being diuresed -Avoid nephrotoxins

## 2023-09-26 NOTE — Assessment & Plan Note (Signed)
Patient with worsening shortness of breath, chest x-ray with concern of pulmonary vascular congestion, BNP elevated at 441. No peripheral edema. -Admit to progressive -IV Lasix 40 mg daily -Strict intake and output -Daily weight and BNP -Cardiology consult

## 2023-09-26 NOTE — Assessment & Plan Note (Signed)
- 

## 2023-09-26 NOTE — Assessment & Plan Note (Signed)
Patient remained bradycardic with junctional rhythm. Not on any nodal blocking agent. -Cardiology consult-they consulted EP evaluation with a possible pacemaker placement on Monday. -Continue to monitor

## 2023-09-27 DIAGNOSIS — D649 Anemia, unspecified: Secondary | ICD-10-CM | POA: Diagnosis not present

## 2023-09-27 DIAGNOSIS — I498 Other specified cardiac arrhythmias: Secondary | ICD-10-CM | POA: Diagnosis not present

## 2023-09-27 DIAGNOSIS — I495 Sick sinus syndrome: Secondary | ICD-10-CM | POA: Diagnosis not present

## 2023-09-27 DIAGNOSIS — I2510 Atherosclerotic heart disease of native coronary artery without angina pectoris: Secondary | ICD-10-CM | POA: Diagnosis not present

## 2023-09-27 DIAGNOSIS — I5033 Acute on chronic diastolic (congestive) heart failure: Secondary | ICD-10-CM | POA: Diagnosis not present

## 2023-09-27 DIAGNOSIS — I480 Paroxysmal atrial fibrillation: Secondary | ICD-10-CM | POA: Diagnosis not present

## 2023-09-27 DIAGNOSIS — R001 Bradycardia, unspecified: Secondary | ICD-10-CM | POA: Diagnosis not present

## 2023-09-27 LAB — BASIC METABOLIC PANEL
Anion gap: 10 (ref 5–15)
BUN: 35 mg/dL — ABNORMAL HIGH (ref 8–23)
CO2: 23 mmol/L (ref 22–32)
Calcium: 8 mg/dL — ABNORMAL LOW (ref 8.9–10.3)
Chloride: 103 mmol/L (ref 98–111)
Creatinine, Ser: 1.36 mg/dL — ABNORMAL HIGH (ref 0.44–1.00)
GFR, Estimated: 37 mL/min — ABNORMAL LOW (ref >60)
Glucose, Bld: 110 mg/dL — ABNORMAL HIGH (ref 70–99)
Potassium: 4.2 mmol/L (ref 3.5–5.1)
Sodium: 136 mmol/L (ref 135–145)

## 2023-09-27 LAB — CBC
HCT: 30.8 % — ABNORMAL LOW (ref 36.0–46.0)
Hemoglobin: 9.3 g/dL — ABNORMAL LOW (ref 12.0–15.0)
MCH: 23.8 pg — ABNORMAL LOW (ref 26.0–34.0)
MCHC: 30.2 g/dL (ref 30.0–36.0)
MCV: 78.8 fL — ABNORMAL LOW (ref 80.0–100.0)
Platelets: 286 10*3/uL (ref 150–400)
RBC: 3.91 MIL/uL (ref 3.87–5.11)
RDW: 18.5 % — ABNORMAL HIGH (ref 11.5–15.5)
WBC: 10 10*3/uL (ref 4.0–10.5)
nRBC: 0 % (ref 0.0–0.2)

## 2023-09-27 LAB — GLUCOSE, CAPILLARY: Glucose-Capillary: 114 mg/dL — ABNORMAL HIGH (ref 70–99)

## 2023-09-27 MED ORDER — FE FUM-VIT C-VIT B12-FA 460-60-0.01-1 MG PO CAPS
1.0000 | ORAL_CAPSULE | Freq: Two times a day (BID) | ORAL | Status: DC
  Administered 2023-09-27 – 2023-09-30 (×7): 1 via ORAL
  Filled 2023-09-27 (×7): qty 1

## 2023-09-27 NOTE — Progress Notes (Signed)
Progress Note   Patient: Jenny [name]:[id number] DOB: 1933-05-29 DOA: 09/26/2023     1 DOS: the patient was seen and examined on 09/27/2023   Brief hospital course: Jenny [name] is a 89 y.o. female with medical history significant of  HFpEF, atrial fibrillation on Eliquis, COPD, hypertension, hyperlipidemia, CAD, presents to the emergency department with worsening shortness of breath.    Per patient she had a chest pain 2 days ago which resolved after taking 3 doses of sublingual nitroglycerin at home.  She was not sure whether it was cardiac or related to her GERD.  No associated palpitations, nausea or vomiting.  She was having worsening dyspnea mostly with exertion over the duration of couple of weeks.   Per patient she noticed black-colored stool over the weekend couple of times, no diarrhea. She has an history of constipation. Last bowel movement was on Wednesday.   ED course and data reviewed.  Patient was having mildly elevated blood pressure on presentation, rest of the vital stable.  Labs pertinent for hemoglobin of 9.3 with baseline above 13 but it was more than a year ago, renal function stable and at baseline.  BNP elevated at 441.  Troponin ordered and pending. Chest x-ray with concern of pulmonary vascular congestion   EKG with bradycardia and concern of junctional rhythm.   Patient was given a dose of IV Lasix, cardiology was also consulted.  10/19: Vitals with heart rate of 44, BP 151/53, troponin 29>>27.  Hemoglobin stable at 9.3 with some microcytosis, anemia panel with iron deficiency, folate 7.4, ferritin 10, B12 556, creatinine increased to 1.36.  Holding further IV diuresis, started on iron supplement. EP was consulted by cardiology for her junctional rhythm and probable pacemaker placement on Monday.  Will restart home torsemide from tomorrow if creatinine stable.      Assessment and Plan: * Acute on chronic heart failure with preserved ejection fraction  (HFpEF) (HCC) Patient with worsening shortness of breath, chest x-ray with concern of pulmonary vascular congestion, BNP elevated at 441. No peripheral edema. Some increase in creatinine with IV Lasix. -Stop IV Lasix -Will resume home torsemide if creatinine remains stable tomorrow -Strict intake and output -Daily weight and BMP -Cardiology consult  Tachycardia-bradycardia syndrome [facility]) Patient remained bradycardic with junctional rhythm. Not on any nodal blocking agent. -Cardiology consult-they consulted EP evaluation with a possible pacemaker placement on Monday. -Continue to monitor  Normocytic anemia Patient with concern of black-colored stool over the weekend.  No diarrhea.  ED provider exam was without any obvious bleeding.  Last BM was on Wednesday.  Patient was on Eliquis Current hemoglobin stable at 9.3, prior check was 13 which was 1 year ago. -Check anemia panel-concern of iron deficiency anemia -Starting on supplement -Monitor for any bleeding -Holding Eliquis-we will resume if blood pressure remains stable in next 1 to 2 days and no more bleeding  Coronary artery disease involving native coronary artery of native heart without angina pectoris Patient had chest pain 2 days ago which resolved with 3 doses of nitroglycerin. -Troponin 27>>24 -Continue statin  Essential hypertension Blood pressure currently elevated.  Not on any antihypertensives at home. -As needed hydralazine  Stage 3b chronic kidney disease (HCC) Slight worsening of creatinine but remained within baseline. -Monitor renal function while she is being diuresed -Avoid nephrotoxins  Type 2 diabetes, diet controlled (HCC) Not on any antidiabetic at home. -Check A1c -Monitor daily CBG  COPD (chronic obstructive pulmonary disease) (HCC) No concern of exacerbation. -Continue  home bronchodilator  HLD (hyperlipidemia) -Continue home statin  PAF (paroxysmal atrial fibrillation) (HCC) Currently having  bradycardia and in junctional rhythm. Not on any rate limiting medication at home -Holding home Eliquis for concern of GI bleeding  Unspecified glaucoma -Continue home eyedrops   Subjective: Patient continued to have some shortness of breath.  She normally sleeps in a recliner over the past 3 years no recent change.  Physical Exam: Vitals:   09/26/23 2320 09/27/23 0457 09/27/23 0748 09/27/23 1113  BP: (!) 154/74 (!) 157/72 (!) 151/53 (!) 152/52  Pulse: (!) 59 91 (!) 44 (!) 48  Resp: 20 20 16 18   Temp: 98.4 F (36.9 C) (!) 97.5 F (36.4 C) (!) 97.4 F (36.3 C) 97.9 F (36.6 C)  TempSrc: Oral     SpO2:  97% 100% 98%  Weight:      Height:       General.  Obese elderly lady, in no acute distress. Pulmonary.  Lungs clear bilaterally, normal respiratory effort. CV.  Bradycardia Abdomen.  Soft, nontender, nondistended, BS positive. CNS.  Alert and oriented .  No focal neurologic deficit. Extremities.  No edema, no cyanosis, pulses intact and symmetrical. Psychiatry.  Judgment and insight appears normal.   Data Reviewed: Prior data reviewed  Family Communication: Discussed with patient  Disposition: Status is: Inpatient Remains inpatient appropriate because: Severity of illness  Planned Discharge Destination: Home  DVT prophylaxis.  Lovenox Time spent: 50 minutes  This record has been created using Conservation officer, historic buildings. Errors have been sought and corrected,but may not always be located. Such creation errors do not reflect on the standard of care.   Author: Arnetha Courser, MD 09/27/2023 3:49 PM  For on call review [email or web address].

## 2023-09-27 NOTE — Plan of Care (Signed)
  Problem: Education: Goal: Knowledge of General Education information will improve Description: Including pain rating scale, medication(s)/side effects and non-pharmacologic comfort measures Outcome: Progressing   Problem: Clinical Measurements: Goal: Ability to maintain clinical measurements within normal limits will improve Outcome: Progressing Goal: Will remain free from infection Outcome: Progressing Goal: Diagnostic test results will improve Outcome: Progressing Goal: Cardiovascular complication will be avoided Outcome: Progressing   Problem: Activity: Goal: Risk for activity intolerance will decrease Outcome: Progressing   Problem: Safety: Goal: Ability to remain free from injury will improve Outcome: Progressing
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
## 2023-09-27 NOTE — Hospital Course (Addendum)
Jenny [name] is a 89 y.o. female with medical history significant of  HFpEF, atrial fibrillation on Eliquis, COPD, hypertension, hyperlipidemia, CAD, presents to the emergency department with worsening shortness of breath.    Per patient she had a chest pain 2 days ago which resolved after taking 3 doses of sublingual nitroglycerin at home.  She was not sure whether it was cardiac or related to her GERD.  No associated palpitations, nausea or vomiting.  She was having worsening dyspnea mostly with exertion over the duration of couple of weeks.   Per patient she noticed black-colored stool over the weekend couple of times, no diarrhea. She has an history of constipation. Last bowel movement was on Wednesday.   ED course and data reviewed.  Patient was having mildly elevated blood pressure on presentation, rest of the vital stable.  Labs pertinent for hemoglobin of 9.3 with baseline above 13 but it was more than a year ago, renal function stable and at baseline.  BNP elevated at 441.  Troponin ordered and pending. Chest x-ray with concern of pulmonary vascular congestion   EKG with bradycardia and concern of junctional rhythm.   Patient was given a dose of IV Lasix, cardiology was also consulted.  10/19: Vitals with heart rate of 44, BP 151/53, troponin 29>>27.  Hemoglobin stable at 9.3 with some microcytosis, anemia panel with iron deficiency, folate 7.4, ferritin 10, B12 556, creatinine increased to 1.36.  Holding further IV diuresis, started on iron supplement. EP was consulted by cardiology for her junctional rhythm and probable pacemaker placement on Monday.  Will restart home torsemide from tomorrow if creatinine stable.  10/20; vital stable, creatinine stable at 1.36 with slight worsening of BUN to 40, holding home torsemide for another day.  EP evaluation for pacemaker tomorrow for concern of sick sinus rhythm, patient does not want any procedure or pacemaker if that will interfere with her  ability to vote or drive.  10/21: Remained hemodynamically stable, hemoglobin stable at 9.5, did not had any more melena or bleeding per rectum since in the hospital.  EP evaluated her and do not think that her bradycardia is causing her shortness of breath, mild to be related to anemia and intermittent GI bleed.  They are recommending IV iron which was ordered, they will arrange close outpatient follow-up to discuss right and left cardiac cath and then determine the need for pacemaker. Patient also has an history of pulmonary hypertension.  10/22: Remained hemodynamically stable with no new concern, hemoglobin improved to 10.  Patient will hold Eliquis until her follow-up with cardiology in 1 to 2 weeks and they will restart if hemoglobin remains stable.  Cardiology will arrange outpatient right and left cardiac catheterization and then evaluate for pacemaker.  Patient was also evaluated by GI.  She does not want any procedure at this time and can follow-up with GI as outpatient if needed.  Patient will continue on current medications and need to have a close follow-up with her PCP and cardiologist for further recommendations.

## 2023-09-28 DIAGNOSIS — I498 Other specified cardiac arrhythmias: Secondary | ICD-10-CM | POA: Diagnosis not present

## 2023-09-28 DIAGNOSIS — I2510 Atherosclerotic heart disease of native coronary artery without angina pectoris: Secondary | ICD-10-CM | POA: Diagnosis not present

## 2023-09-28 DIAGNOSIS — D649 Anemia, unspecified: Secondary | ICD-10-CM | POA: Diagnosis not present

## 2023-09-28 DIAGNOSIS — I5033 Acute on chronic diastolic (congestive) heart failure: Secondary | ICD-10-CM | POA: Diagnosis not present

## 2023-09-28 DIAGNOSIS — I480 Paroxysmal atrial fibrillation: Secondary | ICD-10-CM | POA: Diagnosis not present

## 2023-09-28 DIAGNOSIS — R001 Bradycardia, unspecified: Secondary | ICD-10-CM | POA: Diagnosis not present

## 2023-09-28 DIAGNOSIS — I495 Sick sinus syndrome: Secondary | ICD-10-CM | POA: Diagnosis not present

## 2023-09-28 LAB — CBC
HCT: 31.8 % — ABNORMAL LOW (ref 36.0–46.0)
Hemoglobin: 9.5 g/dL — ABNORMAL LOW (ref 12.0–15.0)
MCH: 24 pg — ABNORMAL LOW (ref 26.0–34.0)
MCHC: 29.9 g/dL — ABNORMAL LOW (ref 30.0–36.0)
MCV: 80.3 fL (ref 80.0–100.0)
Platelets: 265 10*3/uL (ref 150–400)
RBC: 3.96 MIL/uL (ref 3.87–5.11)
RDW: 18.6 % — ABNORMAL HIGH (ref 11.5–15.5)
WBC: 7 10*3/uL (ref 4.0–10.5)
nRBC: 0 % (ref 0.0–0.2)

## 2023-09-28 LAB — BASIC METABOLIC PANEL
Anion gap: 10 (ref 5–15)
BUN: 40 mg/dL — ABNORMAL HIGH (ref 8–23)
CO2: 24 mmol/L (ref 22–32)
Calcium: 8 mg/dL — ABNORMAL LOW (ref 8.9–10.3)
Chloride: 101 mmol/L (ref 98–111)
Creatinine, Ser: 1.36 mg/dL — ABNORMAL HIGH (ref 0.44–1.00)
GFR, Estimated: 37 mL/min — ABNORMAL LOW (ref >60)
Glucose, Bld: 118 mg/dL — ABNORMAL HIGH (ref 70–99)
Potassium: 4.5 mmol/L (ref 3.5–5.1)
Sodium: 135 mmol/L (ref 135–145)

## 2023-09-28 LAB — GLUCOSE, CAPILLARY: Glucose-Capillary: 114 mg/dL — ABNORMAL HIGH (ref 70–99)

## 2023-09-28 MED ORDER — TORSEMIDE 20 MG PO TABS
20.0000 mg | ORAL_TABLET | Freq: Two times a day (BID) | ORAL | Status: DC
  Administered 2023-09-28 – 2023-09-30 (×5): 20 mg via ORAL
  Filled 2023-09-28 (×5): qty 1

## 2023-09-28 MED ORDER — ENOXAPARIN SODIUM 30 MG/0.3ML IJ SOSY
30.0000 mg | PREFILLED_SYRINGE | INTRAMUSCULAR | Status: DC
  Administered 2023-09-29: 30 mg via SUBCUTANEOUS
  Filled 2023-09-28 (×2): qty 0.3

## 2023-09-28 NOTE — Progress Notes (Signed)
Progress Note   Patient: Jenny [name] HQI:[id number] DOB: 04-29-1933 DOA: 09/26/2023     2 DOS: the patient was seen and examined on 09/28/2023   Brief hospital course: Jenny [name] is a 89 y.o. female with medical history significant of  HFpEF, atrial fibrillation on Eliquis, COPD, hypertension, hyperlipidemia, CAD, presents to the emergency department with worsening shortness of breath.    Per patient she had a chest pain 2 days ago which resolved after taking 3 doses of sublingual nitroglycerin at home.  She was not sure whether it was cardiac or related to her GERD.  No associated palpitations, nausea or vomiting.  She was having worsening dyspnea mostly with exertion over the duration of couple of weeks.   Per patient she noticed black-colored stool over the weekend couple of times, no diarrhea. She has an history of constipation. Last bowel movement was on Wednesday.   ED course and data reviewed.  Patient was having mildly elevated blood pressure on presentation, rest of the vital stable.  Labs pertinent for hemoglobin of 9.3 with baseline above 13 but it was more than a year ago, renal function stable and at baseline.  BNP elevated at 441.  Troponin ordered and pending. Chest x-ray with concern of pulmonary vascular congestion   EKG with bradycardia and concern of junctional rhythm.   Patient was given a dose of IV Lasix, cardiology was also consulted.  10/19: Vitals with heart rate of 44, BP 151/53, troponin 29>>27.  Hemoglobin stable at 9.3 with some microcytosis, anemia panel with iron deficiency, folate 7.4, ferritin 10, B12 556, creatinine increased to 1.36.  Holding further IV diuresis, started on iron supplement. EP was consulted by cardiology for her junctional rhythm and probable pacemaker placement on Monday.  Will restart home torsemide from tomorrow if creatinine stable.  10/20; vital stable, creatinine stable at 1.36 with slight worsening of BUN to 40, holding home  torsemide for another day.  EP evaluation for pacemaker tomorrow for concern of sick sinus rhythm, patient does not want any procedure or pacemaker if that will interfere with her ability to vote or drive.    Assessment and Plan: * Acute on chronic heart failure with preserved ejection fraction (HFpEF) (HCC) Patient with worsening shortness of breath, chest x-ray with concern of pulmonary vascular congestion, BNP elevated at 441. No peripheral edema. Some increase in creatinine with IV Lasix.  Remained stable after discontinuing Lasix -Will resume home torsemide if creatinine remains stable tomorrow -Strict intake and output -Daily weight and BMP -Cardiology consult  Tachycardia-bradycardia syndrome [facility]) Patient remained bradycardic with junctional rhythm. Not on any nodal blocking agent. -Cardiology consult-they consulted EP evaluation with a possible pacemaker placement on Monday. -Continue to monitor  Normocytic anemia Patient with concern of black-colored stool over the weekend.  No diarrhea.  ED provider exam was without any obvious bleeding.  Last BM was on Wednesday.  Patient was on Eliquis Current hemoglobin stable at 9.3, prior check was 13 which was 1 year ago. -Check anemia panel-concern of iron deficiency anemia -Starting on supplement -Monitor for any bleeding -Holding Eliquis-we will resume if blood pressure remains stable in next 1 to 2 days and no more bleeding  Coronary artery disease involving native coronary artery of native heart without angina pectoris Patient had chest pain 2 days ago which resolved with 3 doses of nitroglycerin. -Troponin 27>>24 -Continue statin  Essential hypertension Blood pressure currently elevated.  Not on any antihypertensives at home. -As needed hydralazine  Stage  3b chronic kidney disease (HCC) Slight worsening of creatinine but remained within baseline. -Monitor renal function while she is being diuresed -Avoid  nephrotoxins  Type 2 diabetes, diet controlled (HCC) Not on any antidiabetic at home. -Check A1c -Monitor daily CBG  COPD (chronic obstructive pulmonary disease) (HCC) No concern of exacerbation. -Continue home bronchodilator  HLD (hyperlipidemia) -Continue home statin  PAF (paroxysmal atrial fibrillation) (HCC) Currently having bradycardia and in junctional rhythm. Not on any rate limiting medication at home -Holding home Eliquis for concern of GI bleeding  Unspecified glaucoma -Continue home eyedrops   Subjective: Patient was lying down comfortably when seen today.  She wants to think about pacemaker placement as she does not want any procedures which will interfere with her ability to vote or drive Physical Exam: Vitals:   09/28/23 0041 09/28/23 0648 09/28/23 0808 09/28/23 1048  BP: (!) 136/55 (!) 124/49 129/76 (!) 129/55  Pulse: (!) 50 (!) 46 (!) 42 (!) 48  Resp: 18 18 20 20   Temp: 98 F (36.7 C) 97.9 F (36.6 C) 98.6 F (37 C) 97.9 F (36.6 C)  TempSrc:   Oral Oral  SpO2: 98% 98% 100% 98%  Weight:      Height:       General.  Please elderly lady, in no acute distress. Pulmonary.  Lungs clear bilaterally, normal respiratory effort. CV.  Bradycardia Abdomen.  Soft, nontender, nondistended, BS positive. CNS.  Alert and oriented .  No focal neurologic deficit. Extremities.  No edema, no cyanosis, pulses intact and symmetrical. Psychiatry.  Judgment and insight appears normal.    Data Reviewed: Prior data reviewed  Family Communication: Talked with her son on phone.  Disposition: Status is: Inpatient Remains inpatient appropriate because: Severity of illness  Planned Discharge Destination: Home  DVT prophylaxis.  Lovenox Time spent: 45 minutes  This record has been created using Conservation officer, historic buildings. Errors have been sought and corrected,but may not always be located. Such creation errors do not reflect on the standard of care.    Author: Arnetha Courser, MD 09/28/2023 3:23 PM  For on call review [email or web address].

## 2023-09-28 NOTE — Assessment & Plan Note (Signed)
Patient remained bradycardic with junctional rhythm. Not on any nodal blocking agent. Being evaluated by EP today and they do not think that patient needs an urgent pacemaker, shortness of breath likely multifactorial with her pulmonary hypertension, worsening hemoglobin with concern of intermittent GI bleed as she was describing that she had couple of episodes of melena and have seen some bleeding per rectum over the past couple of month. -Continue to monitor

## 2023-09-28 NOTE — Progress Notes (Signed)
Transition of Care [facility]) - Inpatient Brief Assessment   Patient Details  Name: Jenny [name] MRN: [id number] Date of Birth: [birth year]  Transition of Care [facility]) CM/SW Contact:    Bing Quarry, RN Phone Number: 09/28/2023, 12:46 PM   Clinical Narrative: 10/20: To ED 10/18. SOB for months. Hx CHF, atrial fibrillation on Eliquis, COPD, hypertension, hyperlipidemia, CAD,  Dark stools started over past weekend.  Cardiology consult. Possible PACEMAKER on Monday 09/29/23.  TOC to follow for potential needs.   Gabriel Cirri MSN RN CM  Transitions of Care Department [facility] [phone] Weekends Only     Transition of Care Asessment: Insurance and Status: Insurance coverage has been reviewed Patient has primary care physician: Yes Home environment has been reviewed: From home. Prior level of function:: undetermined Prior/Current Home Services: No current home services Social Determinants of Health Reivew: SDOH reviewed no interventions necessary Readmission risk has been reviewed: Yes Transition of care needs: transition of care needs identified, TOC will continue to follow (To follow for likely needs.)

## 2023-09-28 NOTE — Assessment & Plan Note (Signed)
Patient with concern of black-colored stool over the weekend.  No diarrhea.  ED provider exam was without any obvious bleeding.  Last BM was on Wednesday.  Patient was on Eliquis Current hemoglobin stable at 9.3>>9.5, prior check was 13 which was 1 year ago. -Check anemia panel-concern of iron deficiency anemia -Starting on supplement -Also ordered 1 dose of IV iron as recommended by cardiology -Monitor for any bleeding -Holding Eliquis-cardiology will resume in 1 to 2 weeks as outpatient if hemoglobin remains stable and upward trending

## 2023-09-28 NOTE — Progress Notes (Signed)
Rounding Note    Patient Name: Jenny [name] Date of Encounter: 09/28/2023  [facility] Cardiologist: Verne Carrow, MD   Subjective   Denies any blood in stool over the past 24 hours.  Hemoglobin stable at 9.5.  Denies palpitations, dizziness, syncope.  Inpatient Medications    Scheduled Meds:  dorzolamide  1 drop Both Eyes BID   enoxaparin (LOVENOX) injection  30 mg Subcutaneous Q24H   Fe Fum-Vit C-Vit B12-FA  1 capsule Oral BID   gabapentin  100 mg Oral q morning   gabapentin  200 mg Oral QHS   latanoprost  1 drop Both Eyes q AM   rosuvastatin  20 mg Oral Daily   sodium chloride flush  10 mL Intravenous Q12H   sodium chloride flush  3 mL Intravenous Q12H   torsemide  20 mg Oral BID   umeclidinium-vilanterol  1 puff Inhalation Daily   Continuous Infusions:  PRN Meds: acetaminophen **OR** acetaminophen, hydrALAZINE, magnesium hydroxide, nitroGLYCERIN, ondansetron **OR** ondansetron (ZOFRAN) IV, sodium chloride flush   Vital Signs    Vitals:   09/28/23 0041 09/28/23 0648 09/28/23 0808 09/28/23 1048  BP: (!) 136/55 (!) 124/49 129/76 (!) 129/55  Pulse: (!) 50 (!) 46 (!) 42 (!) 48  Resp: 18 18 20 20   Temp: 98 F (36.7 C) 97.9 F (36.6 C) 98.6 F (37 C) 97.9 F (36.6 C)  TempSrc:   Oral Oral  SpO2: 98% 98% 100% 98%  Weight:      Height:        Intake/Output Summary (Last 24 hours) at 09/28/2023 1341 Last data filed at 09/28/2023 0808 Gross per 24 hour  Intake 240 ml  Output 1200 ml  Net -960 ml      09/26/2023   10:18 AM 08/12/2023    8:54 AM 05/09/2023    9:22 AM  Last 3 Weights  Weight (lbs) 184 lb 190 lb 192 lb 3.2 oz  Weight (kg) 83.462 kg 86.183 kg 87.181 kg      Telemetry    Junctional rhythm, atrial flutter heart rate 47- Personally Reviewed  ECG     - Personally Reviewed  Physical Exam   GEN:  Well nourished, well developed in no acute distress HEENT: Normal NECK: No JVD; No carotid bruits CARDIAC: Bradycardic,  regular RESPIRATORY: Diminished breath sounds bilaterally no wheezing ABDOMEN: Soft, non-tender, non-distended MUSCULOSKELETAL:  No edema; No deformity  SKIN: Warm and dry NEUROLOGIC:  Alert and oriented x 3 PSYCHIATRIC:  Normal affect   Labs    High Sensitivity Troponin:   Recent Labs  Lab 09/26/23 1646 09/26/23 1731 09/26/23 1930  TROPONINIHS 29* 27* 27*     Chemistry Recent Labs  Lab 09/26/23 1021 09/26/23 1646 09/27/23 0411 09/28/23 0352  NA 139  --  136 135  K 4.5  --  4.2 4.5  CL 105  --  103 101  CO2 23  --  23 24  GLUCOSE 110*  --  110* 118*  BUN 37*  --  35* 40*  CREATININE 1.07*  --  1.36* 1.36*  CALCIUM 8.6*  --  8.0* 8.0*  PROT  --  6.6  --   --   ALBUMIN  --  3.5  --   --   AST  --  31  --   --   ALT  --  47*  --   --   ALKPHOS  --  91  --   --   BILITOT  --  1.0  --   --   GFRNONAA 50*  --  37* 37*  ANIONGAP 11  --  10 10    Lipids No results for input(s): "CHOL", "TRIG", "HDL", "LABVLDL", "LDLCALC", "CHOLHDL" in the last 168 hours.  Hematology Recent Labs  Lab 09/26/23 1021 09/26/23 1731 09/27/23 0411 09/28/23 0858  WBC 8.8  --  10.0 7.0  RBC 3.93 3.93 3.91 3.96  HGB 9.3*  --  9.3* 9.5*  HCT 32.2*  --  30.8* 31.8*  MCV 81.9  --  78.8* 80.3  MCH 23.7*  --  23.8* 24.0*  MCHC 28.9*  --  30.2 29.9*  RDW 18.6*  --  18.5* 18.6*  PLT 279  --  286 265   Thyroid No results for input(s): "TSH", "FREET4" in the last 168 hours.  BNP Recent Labs  Lab 09/26/23 1021  BNP 441.2*    DDimer No results for input(s): "DDIMER" in the last 168 hours.   Radiology    No results found.  Cardiac Studies   TTE 04/2023  1. Left ventricular ejection fraction, by estimation, is 65 to 70%. The  left ventricle has normal function. The left ventricle has no regional  wall motion abnormalities. Left ventricular diastolic parameters are  consistent with Grade II diastolic  dysfunction (pseudonormalization).   2. Right ventricular systolic function is  normal. The right ventricular  size is mildly enlarged. There is severely elevated pulmonary artery  systolic pressure.   3. Left atrial size was mildly dilated.   4. Right atrial size was moderately dilated.   5. The mitral valve is degenerative. Trivial mitral valve regurgitation.   6. Tricuspid valve regurgitation is mild to moderate.   7. The aortic valve is tricuspid. Aortic valve regurgitation is not  visualized. Aortic valve sclerosis/calcification is present, without any  evidence of aortic stenosis.   8. The inferior vena cava is dilated in size with <50% respiratory  variability, suggesting right atrial pressure of 15 mmHg.   Patient Profile     89 year old female with history of CAD/PCI RCA 2010, HFpEF, paroxysmal atrial fibrillation, hypertension, Mobitz 1 AV block, COPD who presents with worsening shortness of breath being seen for bradycardia/junctional rhythm, atrial flutter.  Assessment & Plan    1.  Bradycardia, junctional rhythm, occasional a flutter on monitor -?  Sick sinus rhythm, heart rate 47 -EP input regarding need for pacemaker tomorrow.   2.  HFpEF -Appears euvolemic -Creatinine slightly elevated with IV Lasix. -Restart PTA torsemide tomorrow if creatinine normalizes.   3.  Paroxysmal A-fib, atrial flutter  -Eliquis being held due to dark stools, concern for GI bleed. -Restart when able.   4.  CAD/PCI to RCA 2010, moderate LAD disease. -Denies chest pain -Crestor 20 mg daily.   -Ischemic workup/left heart cath if pacemaker is planned.  Previously discussed with primary cardiologist.   5. GI bleed, anemia -Hemoglobin stable at 9.5 -BM today with no blood in stool     Signed, Debbe Odea, MD  09/28/2023, 1:41 PM

## 2023-09-28 NOTE — Assessment & Plan Note (Signed)
Patient with worsening shortness of breath, chest x-ray with concern of pulmonary vascular congestion, BNP elevated at 441. No peripheral edema. Some increase in creatinine with IV Lasix.  Slight worsening of creatinine today,  -Resuming home torsemide today -Strict intake and output -Daily weight and BMP -Cardiology consult-will discuss about right and left cardiac catheterization as outpatient.  Patient also has an history of pulmonary hypertension.

## 2023-09-29 DIAGNOSIS — I5033 Acute on chronic diastolic (congestive) heart failure: Secondary | ICD-10-CM | POA: Diagnosis not present

## 2023-09-29 DIAGNOSIS — I495 Sick sinus syndrome: Secondary | ICD-10-CM | POA: Diagnosis not present

## 2023-09-29 DIAGNOSIS — I2510 Atherosclerotic heart disease of native coronary artery without angina pectoris: Secondary | ICD-10-CM | POA: Diagnosis not present

## 2023-09-29 DIAGNOSIS — R001 Bradycardia, unspecified: Secondary | ICD-10-CM | POA: Diagnosis not present

## 2023-09-29 LAB — BASIC METABOLIC PANEL
Anion gap: 9 (ref 5–15)
BUN: 39 mg/dL — ABNORMAL HIGH (ref 8–23)
CO2: 28 mmol/L (ref 22–32)
Calcium: 7.6 mg/dL — ABNORMAL LOW (ref 8.9–10.3)
Chloride: 96 mmol/L — ABNORMAL LOW (ref 98–111)
Creatinine, Ser: 1.57 mg/dL — ABNORMAL HIGH (ref 0.44–1.00)
GFR, Estimated: 32 mL/min — ABNORMAL LOW (ref >60)
Glucose, Bld: 125 mg/dL — ABNORMAL HIGH (ref 70–99)
Potassium: 3.6 mmol/L (ref 3.5–5.1)
Sodium: 133 mmol/L — ABNORMAL LOW (ref 135–145)

## 2023-09-29 LAB — BRAIN NATRIURETIC PEPTIDE: B Natriuretic Peptide: 203.9 pg/mL — ABNORMAL HIGH (ref 0.0–100.0)

## 2023-09-29 MED ORDER — SODIUM CHLORIDE 0.9 % IV SOLN
200.0000 mg | Freq: Once | INTRAVENOUS | Status: AC
  Administered 2023-09-29: 200 mg via INTRAVENOUS
  Filled 2023-09-29: qty 200

## 2023-09-29 NOTE — Assessment & Plan Note (Signed)
Patient had chest pain 2 days ago which resolved with 3 doses of nitroglycerin. -Troponin 27>>24 -Continue statin -Cardiology will discussed right and left cardiac catheterization as outpatient

## 2023-09-29 NOTE — Plan of Care (Signed)

## 2023-09-29 NOTE — Plan of Care (Signed)
  Problem: Education: Goal: Knowledge of General Education information will improve Description Including pain rating scale, medication(s)/side effects and non-pharmacologic comfort measures Outcome: Progressing   Problem: Health Behavior/Discharge Planning: Goal: Ability to manage health-related needs will improve Outcome: Progressing   

## 2023-09-29 NOTE — Assessment & Plan Note (Addendum)
Currently having bradycardia and in junctional rhythm. Not on any rate limiting medication at home -Holding home Eliquis for concern of GI bleeding-cardiology will resume in 1 to 2 weeks as outpatient if hemoglobin remains stable.

## 2023-09-29 NOTE — Assessment & Plan Note (Signed)
Slight worsening of creatinine but remained within baseline. -Monitor renal function while she is being diuresed -Avoid nephrotoxins

## 2023-09-29 NOTE — Consult Note (Addendum)
ELECTROPHYSIOLOGY CONSULT NOTE    Patient ID: IMPI FICHTNER MRN: [id number], DOB/AGE: 87-May-1934 87 y.o.  Admit date: 09/26/2023 Date of Consult: 09/29/2023  Primary Physician: [facility] Primary Cardiologist: Verne Carrow, MD  Adv Heart Failure: Clarisa Kindred, NP Electrophysiologist: Dr. Graciela Husbands   Referring Provider: Dr. Nelson Chimes  Patient Profile: Jenny [name] is a 89 y.o. female with a history of CAD s/p PCI (2010), HTN, HLD, COPD, tobacco abuse, CAD, Mobitz type 1 HB, HFpEF, parox afib, T2DM who is being seen today for the evaluation of bradycardia at the request of Dr. Nelson Chimes.  HPI:  Jenny [name] is a 89 y.o. female with PMH as above.  In 2022 she underwent cardioversion for her afib, and post-conversion was 2:1 Mobitz type 1. On follow-up at AFib clinic she noted increased energy and EKG at that time showed sinus rhythm at 90 with profound 1st deg AV block of .   She was evaluated for a PPM 03/2022 by Dr. Graciela Husbands, who felt that her bradycardia had been stable for many years and proceeded her SOB symptoms. She also demonstrated chronotropic competence on her previous zio. Recommended LHC to further eval her coronary artery disease to rule out anginal equivalent.  She refused LHC and and has been following regularly with Dr. Clifton James and HF team at [facility]. Most recent HF appt was 08/12/2023.   She presented to [facility] ED 09/26/2023 with SOB x several months and dark stools x several days. She tells me that she was SOB with minimal activity like walking to the bathroom. Because she was SOB with walking to bathroom, she lowered her torsemide dose to daily so that she would not need to use the bathroom as often. Checks HR using pulse ox, readings used to be 30-40s while on entresto, entresto stopped many months ago and pulse has now increased to 40-50s.   In ED, CXR showed mild edema, trops flat (29> 27 > 27). BNP elevated on admission so IV lasix given x 1, has since  resumed home torsemide. Hgb on admission 9.3 Eliquis being held for dark stools and anemia.   This morning she feels better, SOB has improved though she has not walked much. She is able to walk to bathroom and back to bed without SOB. She had BM earlier today that was brown. No cardiac awareness of afib currently.  She has noticed her HR are higher now that she is off eliquis and questions whether it has caused her bradycardia.   Son is at bedside   Labs Potassium3.6 (10/21 9562)   Creatinine, ser  1.57* (10/21 0358) PLT  265 (10/20 0858) HGB  9.5* (10/20 0858) WBC 7.0 (10/20 0858) Troponin I (High Sensitivity)27* (10/18 1930).    Past Medical History:  Diagnosis Date   ACUT DUOD ULCER W/HEMORR&PERF W/O MENTION OBST 10/05/2009   NSAID induced   ALLERGIC RHINITIS CAUSE UNSPECIFIED    ANEMIA-NOS    CAD (coronary artery disease) 06/08/2009   DEs RCA with 70% LAD and EF 60%   CHF (congestive heart failure) (HCC)    COPD    mild obst on PFTs 03/2010   Diabetes mellitus 06/2010 dx   Mild, diet controlled   GLAUCOMA    HYPERLIPIDEMIA    HYPERTENSION, BENIGN    MYOCARDIAL INFARCTION 06/08/2009   des to rca   Persistent atrial fibrillation (HCC)    Dx 08/2021   TOBACCO ABUSE      Surgical History:  Past Surgical History:  Procedure Laterality Date   CARDIOVERSION N/A 01/30/2022   Procedure: CARDIOVERSION;  Surgeon: Nahser, Deloris Ping, MD;  Location: [facility] ENDOSCOPY;  Service: Cardiovascular;  Laterality: N/A;   HEMORRHOID SURGERY  1990   Right knee surgery       Medications Prior to Admission  Medication Sig Dispense Refill Last Dose   acetaminophen (TYLENOL) 500 MG tablet Take 500 mg by mouth every 6 (six) hours as needed. Takes two tablets in the morning and two tablets at night   unk   albuterol (VENTOLIN HFA) 108 (90 Base) MCG/ACT inhaler INHALE ONE PUFF EVERY 6 HOURS AS NEEDED FOR SHORTNESS OF BREATH 18 g 2    apixaban (ELIQUIS) 5 MG TABS tablet TAKE ONE TABLET TWICE DAILY  180 tablet 1 09/26/2023 at 0730   dorzolamide (TRUSOPT) 2 % ophthalmic solution Place 1 drop into both eyes 2 (two) times daily.   09/26/2023   gabapentin (NEURONTIN) 100 MG capsule Take 100-200 mg by mouth See admin instructions. Take 1 capsule (100mg ) by mouth every morning and take 2 capsules (200mg ) by mouth at bedtime   09/26/2023   hydrOXYzine (ATARAX) 25 MG tablet Take 25 mg by mouth every 8 (eight) hours as needed.   09/26/2023   ipratropium-albuterol (DUONEB) 0.5-2.5 (3) MG/3ML SOLN Take 3 mLs by nebulization every 6 (six) hours as needed. 360 mL 1 unk   latanoprost (XALATAN) 0.005 % ophthalmic solution Place 1 drop into both eyes in the morning.   09/26/2023   magnesium hydroxide (MILK OF MAGNESIA) 400 MG/5ML suspension Take 30 mLs by mouth as needed for mild constipation.   unk   NITROSTAT 0.4 MG SL tablet DISSOLVE ONE TABLET UNDER TONGUE AS NEEDED FOR CHEST PAIN - MAY REPEAT TWICE-IF NO RELIEF GO TO NEAREST HOSPITAL ER 25 tablet 0 unk   nystatin (MYCOSTATIN/NYSTOP) powder Apply 1 application topically daily.   09/26/2023   potassium chloride SA (KLOR-CON M) 20 MEQ tablet Take 1 tablet (20 mEq total) by mouth 2 (two) times daily. (Patient taking differently: Take 20 mEq by mouth as needed (leg jerking).) 60 tablet 3 unk   predniSONE (DELTASONE) 20 MG tablet Take 20 mg by mouth daily with breakfast.   09/26/2023   rosuvastatin (CRESTOR) 20 MG tablet Take 20 mg by mouth daily.   09/26/2023   torsemide (DEMADEX) 20 MG tablet Take 2 tablets (40 mg total) by mouth 2 (two) times daily. (Patient taking differently: Take 20 mg by mouth 2 (two) times daily.) 120 tablet 5 09/26/2023   umeclidinium-vilanterol (ANORO ELLIPTA) 62.5-25 MCG/ACT AEPB Inhale 1 puff into the lungs daily.   09/26/2023   Fluticasone-Umeclidin-Vilant (TRELEGY ELLIPTA) 100-62.5-25 MCG/INH AEPB Inhale 1 puff into the lungs daily. (Patient not taking: Reported on 08/12/2023) 1 each 1 Not Taking    Inpatient Medications:    dorzolamide  1 drop Both Eyes BID   enoxaparin (LOVENOX) injection  30 mg Subcutaneous Q24H   Fe Fum-Vit C-Vit B12-FA  1 capsule Oral BID   gabapentin  100 mg Oral q morning   gabapentin  200 mg Oral QHS   latanoprost  1 drop Both Eyes q AM   rosuvastatin  20 mg Oral Daily   sodium chloride flush  10 mL Intravenous Q12H   sodium chloride flush  3 mL Intravenous Q12H   torsemide  20 mg Oral BID   umeclidinium-vilanterol  1 puff Inhalation Daily    Allergies:  Allergies  Allergen Reactions   Entresto [Sacubitril-Valsartan] Shortness Of Breath    COPD  and reports increased SOB with Entresto   Aspirin Other (See Comments)    Can take 81 mg not 325 mg   Atorvastatin Itching    Itching and myaliga   Cyclobenzaprine     Other reaction(s): muscle relaxers-ulcer hemorrhage (hospitalized)   Lactose Intolerance (Gi) Other (See Comments)    GI upset   Meperidine Hcl Other (See Comments)    Not known   Pravastatin Rash    rash   Propoxyphene     Other reaction(s): pain meds, hallucination, vomiting   Wound Dressing Adhesive     Other reaction(s): red, stings    Family History  Problem Relation Age of Onset   Hypertension Mother    Ulcers Mother    Stomach cancer Father    Stroke Maternal Grandmother    Heart attack Neg Hx      Physical Exam: Vitals:   09/28/23 1925 09/28/23 2201 09/28/23 2358 09/29/23 0314  BP: (!) 143/52 122/60 (!) 128/44 (!) 143/51  Pulse: (!) 49 (!) 47 (!) 47 (!) 45  Resp: 20  20 20   Temp: 98.7 F (37.1 C)  98.2 F (36.8 C) 98.6 F (37 C)  TempSrc: Oral  Oral   SpO2: 100% 99% 97% 99%  Weight:      Height:        GEN- NAD, A&O x 3, normal affect HEENT: Normocephalic, atraumatic Lungs- CTAB, Normal effort.  Heart- Regular rate and rhythm, No M/G/R.  GI- Soft, NT, ND.  Extremities- No clubbing, cyanosis, or edema   Radiology/Studies: DG Chest 2 View  Result Date: 09/26/2023 CLINICAL DATA:  shob EXAM: CHEST - 2 VIEW COMPARISON:  01/03/2022.  FINDINGS: Mildly increased interstitial markings. Bilateral lung fields are otherwise clear. No acute consolidation or lung collapse. Bilateral costophrenic angles are clear. Stable cardio-mediastinal silhouette. Aortic arch calcifications noted. No acute osseous abnormalities. The soft tissues are within normal limits. IMPRESSION: 1. Mild interstitial edema. Electronically Signed   By: Jules Schick M.D.   On: 09/26/2023 12:19    TTE, 04/09/2023 1. Left ventricular ejection fraction, by estimation, is 65 to 70%. The left ventricle has normal function. The left ventricle has no regional wall motion abnormalities. Left ventricular diastolic parameters are consistent with Grade II diastolic dysfunction (pseudonormalization).   2. Right ventricular systolic function is normal. The right ventricular size is mildly enlarged. There is severely elevated pulmonary artery systolic pressure.   3. Left atrial size was mildly dilated.   4. Right atrial size was moderately dilated.   5. The mitral valve is degenerative. Trivial mitral valve regurgitation.   6. Tricuspid valve regurgitation is mild to moderate.   7. The aortic valve is tricuspid. Aortic valve regurgitation is not visualized. Aortic valve sclerosis/calcification is present, without any evidence of aortic stenosis.   8. The inferior vena cava is dilated in size with <50% respiratory variability, suggesting right atrial pressure of 15 mmHg.    EKG: afib with slow ventricular rate, 47bpm (personally reviewed)  TELEMETRY: AFib, rates 40-60s (personally reviewed)    Assessment/Plan:  #) parox afib #) bradycardia #) Hypercoag d/t parox afib #) melena  #) IDA #) SOB #) pulm HTN #) COPD  Patient's bradycardia has proceeded SOB by many months to years, does not appear to be driving patient's current symptoms No indication for PPM at this time Would recommend treating IDA and GI bleed - consider consult to GI  - consider 1u  PRBC CHA2DS2-VASc Score = 7 (CHF, HTN, DM, Vasc dz, age  x 2, gender)  Agree with holding eliquis at this time until GI workup complete and anemia improved Consider DCCV in future once back on eliquis uninterrupted for 3-4 weeks, no indication for TEE/DCCV urgently Consider R/LHC in future to further eval pulm HTN    EP will sign off at this time, but remains available.       For questions or updates, please contact [facility] Please consult [email or web address] for contact info under Cardiology/STEMI.  Signed, Sherie Don, NP  09/29/2023 8:12 AM   As above  Discussed with hospitalist and cardiology--t hink driver now is probably anemia Will continue to be attentive to bradycardia

## 2023-09-29 NOTE — Progress Notes (Addendum)
Progress Note   Patient: Jenny [name] ZOX:[id number] DOB: May 01, 1933 DOA: 09/26/2023     3 DOS: the patient was seen and examined on 09/29/2023   Brief hospital course: NASHAYA HAITZ is a 89 y.o. female with medical history significant of  HFpEF, atrial fibrillation on Eliquis, COPD, hypertension, hyperlipidemia, CAD, presents to the emergency department with worsening shortness of breath.    Per patient she had a chest pain 2 days ago which resolved after taking 3 doses of sublingual nitroglycerin at home.  She was not sure whether it was cardiac or related to her GERD.  No associated palpitations, nausea or vomiting.  She was having worsening dyspnea mostly with exertion over the duration of couple of weeks.   Per patient she noticed black-colored stool over the weekend couple of times, no diarrhea. She has an history of constipation. Last bowel movement was on Wednesday.   ED course and data reviewed.  Patient was having mildly elevated blood pressure on presentation, rest of the vital stable.  Labs pertinent for hemoglobin of 9.3 with baseline above 13 but it was more than a year ago, renal function stable and at baseline.  BNP elevated at 441.  Troponin ordered and pending. Chest x-ray with concern of pulmonary vascular congestion   EKG with bradycardia and concern of junctional rhythm.   Patient was given a dose of IV Lasix, cardiology was also consulted.  10/19: Vitals with heart rate of 44, BP 151/53, troponin 29>>27.  Hemoglobin stable at 9.3 with some microcytosis, anemia panel with iron deficiency, folate 7.4, ferritin 10, B12 556, creatinine increased to 1.36.  Holding further IV diuresis, started on iron supplement. EP was consulted by cardiology for her junctional rhythm and probable pacemaker placement on Monday.  Will restart home torsemide from tomorrow if creatinine stable.  10/20; vital stable, creatinine stable at 1.36 with slight worsening of BUN to 40, holding home  torsemide for another day.  EP evaluation for pacemaker tomorrow for concern of sick sinus rhythm, patient does not want any procedure or pacemaker if that will interfere with her ability to vote or drive.  10/21: Remained hemodynamically stable, hemoglobin stable at 9.5, did not had any more melena or bleeding per rectum since in the hospital.  EP evaluated her and do not think that her bradycardia is causing her shortness of breath, mild to be related to anemia and intermittent GI bleed.  They are recommending IV iron which was ordered, they will arrange close outpatient follow-up to discuss right and left cardiac cath and then determine the need for pacemaker. Patient also has an history of pulmonary hypertension.   Assessment and Plan: * Acute on chronic heart failure with preserved ejection fraction (HFpEF) (HCC) Patient with worsening shortness of breath, chest x-ray with concern of pulmonary vascular congestion, BNP elevated at 441. No peripheral edema. Some increase in creatinine with IV Lasix.  Slight worsening of creatinine today,  -Resuming home torsemide today -Strict intake and output -Daily weight and BMP -Cardiology consult-will discuss about right and left cardiac catheterization as outpatient.  Patient also has an history of pulmonary hypertension.  Tachycardia-bradycardia syndrome (HCC) Patient remained bradycardic with junctional rhythm. Not on any nodal blocking agent. Being evaluated by EP today and they do not think that patient needs an urgent pacemaker, shortness of breath likely multifactorial with her pulmonary hypertension, worsening hemoglobin with concern of intermittent GI bleed as she was describing that she had couple of episodes of melena and have seen  some bleeding per rectum over the past couple of month. -Continue to monitor  Normocytic anemia Patient with concern of black-colored stool over the weekend.  No diarrhea.  ED provider exam was without any obvious  bleeding.  Last BM was on Wednesday.  Patient was on Eliquis Current hemoglobin stable at 9.3>>9.5, prior check was 13 which was 1 year ago. -Check anemia panel-concern of iron deficiency anemia -Starting on supplement -Also ordered 1 dose of IV iron as recommended by cardiology -Monitor for any bleeding -Holding Eliquis-cardiology will resume in 1 to 2 weeks as outpatient if hemoglobin remains stable and upward trending  Coronary artery disease involving native coronary artery of native heart without angina pectoris Patient had chest pain 2 days ago which resolved with 3 doses of nitroglycerin. -Troponin 27>>24 -Continue statin -Cardiology will discussed right and left cardiac catheterization as outpatient  Essential hypertension Blood pressure currently elevated.  Not on any antihypertensives at home. -As needed hydralazine  Stage 3b chronic kidney disease (HCC) Slight worsening of creatinine but remained within baseline. -Monitor renal function while she is being diuresed -Avoid nephrotoxins  Type 2 diabetes, diet controlled (HCC) Not on any antidiabetic at home. -Check A1c -Monitor daily CBG  COPD (chronic obstructive pulmonary disease) (HCC) No concern of exacerbation. -Continue home bronchodilator  HLD (hyperlipidemia) -Continue home statin  PAF (paroxysmal atrial fibrillation) (HCC) Currently having bradycardia and in junctional rhythm. Not on any rate limiting medication at home -Holding home Eliquis for concern of GI bleeding-cardiology will resume in 1 to 2 weeks as outpatient if hemoglobin remains stable.  Unspecified glaucoma -Continue home eyedrops   Subjective: Patient continued to have some shortness of breath.  Not sure whether she wants any procedure at this time or not.  Physical Exam: Vitals:   09/28/23 2358 09/29/23 0314 09/29/23 0843 09/29/23 1237  BP: (!) 128/44 (!) 143/51 136/63 126/64  Pulse: (!) 47 (!) 45 (!) 46 (!) 48  Resp: 20 20    Temp:  98.2 F (36.8 C) 98.6 F (37 C) 97.6 F (36.4 C) 97.7 F (36.5 C)  TempSrc: Oral     SpO2: 97% 99% 100% 100%  Weight:      Height:       General.  Pleasant elderly lady, in no acute distress. Pulmonary.  Lungs clear bilaterally, normal respiratory effort. CV.  Bradycardia Abdomen.  Soft, nontender, nondistended, BS positive. CNS.  Alert and oriented .  No focal neurologic deficit. Extremities.  No edema, no cyanosis, pulses intact and symmetrical. Psychiatry.  Judgment and insight appears normal.   Data Reviewed: Prior data reviewed  Family Communication: Talked with her son on phone.  Disposition: Status is: Inpatient Remains inpatient appropriate because: Severity of illness  Planned Discharge Destination: Home  DVT prophylaxis.  Lovenox Time spent: 50 minutes  This record has been created using Conservation officer, historic buildings. Errors have been sought and corrected,but may not always be located. Such creation errors do not reflect on the standard of care.   Author: Arnetha Courser, MD 09/29/2023 2:34 PM  For on call review [email or web address].

## 2023-09-30 DIAGNOSIS — D508 Other iron deficiency anemias: Secondary | ICD-10-CM | POA: Diagnosis not present

## 2023-09-30 DIAGNOSIS — R001 Bradycardia, unspecified: Secondary | ICD-10-CM | POA: Diagnosis not present

## 2023-09-30 DIAGNOSIS — I5033 Acute on chronic diastolic (congestive) heart failure: Secondary | ICD-10-CM | POA: Diagnosis not present

## 2023-09-30 DIAGNOSIS — D649 Anemia, unspecified: Secondary | ICD-10-CM | POA: Diagnosis not present

## 2023-09-30 DIAGNOSIS — J449 Chronic obstructive pulmonary disease, unspecified: Secondary | ICD-10-CM | POA: Diagnosis not present

## 2023-09-30 LAB — CBC
HCT: 31.9 % — ABNORMAL LOW (ref 36.0–46.0)
Hemoglobin: 10 g/dL — ABNORMAL LOW (ref 12.0–15.0)
MCH: 23.9 pg — ABNORMAL LOW (ref 26.0–34.0)
MCHC: 31.3 g/dL (ref 30.0–36.0)
MCV: 76.3 fL — ABNORMAL LOW (ref 80.0–100.0)
Platelets: 270 10*3/uL (ref 150–400)
RBC: 4.18 MIL/uL (ref 3.87–5.11)
RDW: 18.9 % — ABNORMAL HIGH (ref 11.5–15.5)
WBC: 7.3 10*3/uL (ref 4.0–10.5)
nRBC: 0.7 % — ABNORMAL HIGH (ref 0.0–0.2)

## 2023-09-30 LAB — BASIC METABOLIC PANEL
Anion gap: 11 (ref 5–15)
BUN: 45 mg/dL — ABNORMAL HIGH (ref 8–23)
CO2: 27 mmol/L (ref 22–32)
Calcium: 7.5 mg/dL — ABNORMAL LOW (ref 8.9–10.3)
Chloride: 96 mmol/L — ABNORMAL LOW (ref 98–111)
Creatinine, Ser: 1.4 mg/dL — ABNORMAL HIGH (ref 0.44–1.00)
GFR, Estimated: 36 mL/min — ABNORMAL LOW (ref >60)
Glucose, Bld: 111 mg/dL — ABNORMAL HIGH (ref 70–99)
Potassium: 3.5 mmol/L (ref 3.5–5.1)
Sodium: 134 mmol/L — ABNORMAL LOW (ref 135–145)

## 2023-09-30 MED ORDER — TORSEMIDE 20 MG PO TABS: 20.0000 mg | ORAL_TABLET | Freq: Two times a day (BID) | ORAL | 1 refills | Status: DC

## 2023-09-30 MED ORDER — APIXABAN 5 MG PO TABS: 5.0000 mg | ORAL_TABLET | Freq: Two times a day (BID) | ORAL | Status: DC

## 2023-09-30 MED ORDER — FE FUM-VIT C-VIT B12-FA 460-60-0.01-1 MG PO CAPS: 1.0000 | ORAL_CAPSULE | Freq: Two times a day (BID) | ORAL | 0 refills | Status: DC

## 2023-09-30 NOTE — Progress Notes (Signed)
   This pt is currently active with [facility] which is a home based Palliative care program provided by [facility]. She has been getting ongoing nursing case management and SW visit/telehealth support for her acute/chronic illness.   This pt is eligible to return home with our support at d/c.   Norm Parcel RN [phone]

## 2023-09-30 NOTE — Evaluation (Signed)
Physical Therapy Evaluation Patient Details Name: Jenny [name] MRN: [id number] DOB: 1932-12-29 Today's Date: 09/30/2023  History of Present Illness  89 y/o female presented to ED on 09/26/23 for SOB for months. Admitted for acute on chronic heart failure, anemia, and tachycardia-bradycardia syndrome. PMH: CKD stage 3b, HTN, T2DM, COPD, Afib, CAD  Clinical Impression  Patient admitted with the above. PTA, patient lives alone and reports independence with mobility with no AD within the home and rollator in the community or outdoors. Reports 1 fall in past 6 months. Patient functioning at modI level with no AD but does reach out for wall or rail in hallway for support as she reports she does furniture walk at home. No further skilled PT needs identified acutely. PT will complete orders at this time.        Equipment Recommendations None recommended by PT  Recommendations for Other Services       Functional Status Assessment Patient has had a recent decline in their functional status and demonstrates the ability to make significant improvements in function in a reasonable and predictable amount of time.     Precautions / Restrictions Precautions Precautions: Fall Restrictions Weight Bearing Restrictions: No      Mobility  Bed Mobility Overal bed mobility: Modified Independent    Transfers Overall transfer level: Modified independent Equipment used: None    Ambulation/Gait Ambulation/Gait assistance: Supervision Gait Distance (Feet): 50 Feet Assistive device: None Gait Pattern/deviations: Step-through pattern, Decreased stride length, Decreased stance time - right, Decreased stance time - left, Trunk flexed Gait velocity: decreased     General Gait Details: supervision for safety. Patient reaching out for wall and rail in hallway for support as she tends to furniture walk at home        Pertinent Vitals/Pain Pain Assessment Pain Assessment: No/denies pain    Home  Living Family/patient expects to be discharged to:: Private residence Living Arrangements: Alone Available Help at Discharge: Family Type of Home: House Home Access: Ramped entrance       Home Layout: One level Home Equipment: Agricultural consultant (2 wheels)      Prior Function Prior Level of Function : Independent/Modified Independent;Driving             Mobility Comments: uses rollator for community distances       Extremity/Trunk Assessment   Upper Extremity Assessment Upper Extremity Assessment: Overall WFL for tasks assessed    Lower Extremity Assessment Lower Extremity Assessment: Generalized weakness    Cervical / Trunk Assessment Cervical / Trunk Assessment: Kyphotic  Communication   Communication Communication: No apparent difficulties  Cognition Arousal: Alert Behavior During Therapy: WFL for tasks assessed/performed Overall Cognitive Status: Within Functional Limits for tasks assessed                                          General Comments General comments (skin integrity, edema, etc.): VSS on RA. HR up to 78    Exercises     Assessment/Plan    PT Assessment Patient does not need any further PT services  PT Problem List         PT Treatment Interventions      PT Goals (Current goals can be found in the Care Plan section)  Acute Rehab PT Goals Patient Stated Goal: to go home PT Goal Formulation: All assessment and education complete, DC therapy    Frequency  Co-evaluation               AM-PAC PT "6 Clicks" Mobility  Outcome Measure Help needed turning from your back to your side while in a flat bed without using bedrails?: None Help needed moving from lying on your back to sitting on the side of a flat bed without using bedrails?: None Help needed moving to and from a bed to a chair (including a wheelchair)?: None Help needed standing up from a chair using your arms (e.g., wheelchair or bedside chair)?:  None Help needed to walk in hospital room?: A Little Help needed climbing 3-5 steps with a railing? : A Little 6 Click Score: 22    End of Session   Activity Tolerance: Patient tolerated treatment well Patient left: in bed;with call bell/phone within reach Nurse Communication: Mobility status PT Visit Diagnosis: Muscle weakness (generalized) (M62.81);Unsteadiness on feet (R26.81)    Time: 0865-7846 PT Time Calculation (min) (ACUTE ONLY): 12 min   Charges:   PT Evaluation $PT Eval Low Complexity: 1 Low   PT General Charges $$ ACUTE PT VISIT: 1 Visit         Maylon Peppers, PT, DPT Physical Therapist - [facility]  [facility]   Eliodoro Gullett A Ivianna Notch 09/30/2023, 11:09 AM

## 2023-09-30 NOTE — Discharge Summary (Signed)
Physician Discharge Summary   Patient: Jenny [name] MRN: [id number] DOB: 04-07-1933  Admit date:     09/26/2023  Discharge date: 09/30/23  Discharge Physician: Arnetha Courser   PCP: [facility]   Recommendations at discharge:  Please obtain CBC and BMP on follow-up Please consider restarting Eliquis if hemoglobin remains stable and no more episode of bleeding. Follow-up with primary care provider Follow-up with cardiology If continue to have concern of GI bleed-follow-up with gastroenterology  Discharge Diagnoses: Principal Problem:   Acute on chronic heart failure with preserved ejection fraction (HFpEF) (HCC) Active Problems:   Tachycardia-bradycardia syndrome (HCC)   Coronary artery disease involving native coronary artery of native heart without angina pectoris   Normocytic anemia   Essential hypertension   Stage 3b chronic kidney disease (HCC)   Type 2 diabetes, diet controlled (HCC)   COPD (chronic obstructive pulmonary disease) (HCC)   HLD (hyperlipidemia)   PAF (paroxysmal atrial fibrillation) (HCC)   Unspecified glaucoma   Symptomatic bradycardia   Junctional rhythm   Hospital Course: Jenny [name] is a 89 y.o. female with medical history significant of  HFpEF, atrial fibrillation on Eliquis, COPD, hypertension, hyperlipidemia, CAD, presents to the emergency department with worsening shortness of breath.    Per patient she had a chest pain 2 days ago which resolved after taking 3 doses of sublingual nitroglycerin at home.  She was not sure whether it was cardiac or related to her GERD.  No associated palpitations, nausea or vomiting.  She was having worsening dyspnea mostly with exertion over the duration of couple of weeks.   Per patient she noticed black-colored stool over the weekend couple of times, no diarrhea. She has an history of constipation. Last bowel movement was on Wednesday.   ED course and data reviewed.  Patient was having mildly  elevated blood pressure on presentation, rest of the vital stable.  Labs pertinent for hemoglobin of 9.3 with baseline above 13 but it was more than a year ago, renal function stable and at baseline.  BNP elevated at 441.  Troponin ordered and pending. Chest x-ray with concern of pulmonary vascular congestion   EKG with bradycardia and concern of junctional rhythm.   Patient was given a dose of IV Lasix, cardiology was also consulted.  10/19: Vitals with heart rate of 44, BP 151/53, troponin 29>>27.  Hemoglobin stable at 9.3 with some microcytosis, anemia panel with iron deficiency, folate 7.4, ferritin 10, B12 556, creatinine increased to 1.36.  Holding further IV diuresis, started on iron supplement. EP was consulted by cardiology for her junctional rhythm and probable pacemaker placement on Monday.  Will restart home torsemide from tomorrow if creatinine stable.  10/20; vital stable, creatinine stable at 1.36 with slight worsening of BUN to 40, holding home torsemide for another day.  EP evaluation for pacemaker tomorrow for concern of sick sinus rhythm, patient does not want any procedure or pacemaker if that will interfere with her ability to vote or drive.  10/21: Remained hemodynamically stable, hemoglobin stable at 9.5, did not had any more melena or bleeding per rectum since in the hospital.  EP evaluated her and do not think that her bradycardia is causing her shortness of breath, mild to be related to anemia and intermittent GI bleed.  They are recommending IV iron which was ordered, they will arrange close outpatient follow-up to discuss right and left cardiac cath and then determine the need for pacemaker. Patient also has an history of pulmonary  hypertension.  10/22: Remained hemodynamically stable with no new concern, hemoglobin improved to 10.  Patient will hold Eliquis until her follow-up with cardiology in 1 to 2 weeks and they will restart if hemoglobin remains stable.  Cardiology  will arrange outpatient right and left cardiac catheterization and then evaluate for pacemaker.  Patient was also evaluated by GI.  She does not want any procedure at this time and can follow-up with GI as outpatient if needed.  Patient will continue on current medications and need to have a close follow-up with her PCP and cardiologist for further recommendations.    Assessment and Plan: * Acute on chronic heart failure with preserved ejection fraction (HFpEF) (HCC) Patient with worsening shortness of breath, chest x-ray with concern of pulmonary vascular congestion, BNP elevated at 441. No peripheral edema. Some increase in creatinine with IV Lasix.  So IV diuresis was held,  -Resuming home torsemide today -Strict intake and output -Daily weight and BMP -Cardiology consult-will discuss about right and left cardiac catheterization as outpatient.  Patient also has an history of pulmonary hypertension.  Tachycardia-bradycardia syndrome (HCC) Patient remained bradycardic with junctional rhythm. Not on any nodal blocking agent. Being evaluated by EP today and they do not think that patient needs an urgent pacemaker, shortness of breath likely multifactorial with her pulmonary hypertension, worsening hemoglobin with concern of intermittent GI bleed as she was describing that she had couple of episodes of melena and have seen some bleeding per rectum over the past couple of month. -Continue to monitor  Normocytic anemia Patient with concern of black-colored stool over the weekend.  No diarrhea.  ED provider exam was without any obvious bleeding.  Last BM was on Wednesday.  Patient was on Eliquis Current hemoglobin stable at 9.3>>9.5>>10.0, prior check was 13 which was 1 year ago. Received 1 dose of IV iron and started on iron supplement for home. -Monitor for any bleeding -Holding Eliquis-cardiology will resume in 1 to 2 weeks as outpatient if hemoglobin remains stable and upward  trending  Coronary artery disease involving native coronary artery of native heart without angina pectoris Patient had chest pain 2 days ago which resolved with 3 doses of nitroglycerin. -Troponin 27>>24 -Continue statin -Cardiology will discussed right and left cardiac catheterization as outpatient  Essential hypertension Blood pressure currently elevated.  Not on any antihypertensives at home. -As needed hydralazine  Stage 3b chronic kidney disease (HCC) Now stable and at baseline -Monitor renal function while she is being diuresed -Avoid nephrotoxins  Type 2 diabetes, diet controlled (HCC) Not on any antidiabetic at home. -Check A1c -Monitor daily CBG  COPD (chronic obstructive pulmonary disease) (HCC) No concern of exacerbation. -Continue home bronchodilator  HLD (hyperlipidemia) -Continue home statin  PAF (paroxysmal atrial fibrillation) (HCC) Currently having bradycardia and in junctional rhythm. Not on any rate limiting medication at home -Holding home Eliquis for concern of GI bleeding-cardiology will resume in 1 to 2 weeks as outpatient if hemoglobin remains stable.  Unspecified glaucoma -Continue home eyedrops  Consultants: Cardiology.  EP.  Gastroenterology Procedures performed: None Disposition: Home Diet recommendation:  Discharge Diet Orders (From admission, onward)     Start     Ordered   09/30/23 0000  Diet - low sodium heart healthy        09/30/23 1052           Cardiac diet DISCHARGE MEDICATION: Allergies as of 09/30/2023       Reactions   Entresto [sacubitril-valsartan] Shortness Of Breath  COPD and reports increased SOB with Entresto   Aspirin Other (See Comments)   Can take 81 mg not 325 mg   Atorvastatin Itching   Itching and myaliga   Cyclobenzaprine    Other reaction(s): muscle relaxers-ulcer hemorrhage (hospitalized)   Lactose Intolerance (gi) Other (See Comments)   GI upset   Meperidine Hcl Other (See Comments)   Not  known   Pravastatin Rash   rash   Propoxyphene    Other reaction(s): pain meds, hallucination, vomiting   Wound Dressing Adhesive    Other reaction(s): red, stings        Medication List     STOP taking these medications    predniSONE 20 MG tablet Commonly known as: DELTASONE   Trelegy Ellipta 100-62.5-25 MCG/INH Aepb Generic drug: Fluticasone-Umeclidin-Vilant       TAKE these medications    acetaminophen 500 MG tablet Commonly known as: TYLENOL Take 500 mg by mouth every 6 (six) hours as needed. Takes two tablets in the morning and two tablets at night   albuterol 108 (90 Base) MCG/ACT inhaler Commonly known as: Ventolin HFA INHALE ONE PUFF EVERY 6 HOURS AS NEEDED FOR SHORTNESS OF BREATH   apixaban 5 MG Tabs tablet Commonly known as: Eliquis Take 1 tablet (5 mg total) by mouth 2 (two) times daily. Hold until your cardiology visit What changed: additional instructions   dorzolamide 2 % ophthalmic solution Commonly known as: TRUSOPT Place 1 drop into both eyes 2 (two) times daily.   Fe Fum-Vit C-Vit B12-FA Caps capsule Commonly known as: TRIGELS-F FORTE Take 1 capsule by mouth 2 (two) times daily.   gabapentin 100 MG capsule Commonly known as: NEURONTIN Take 100-200 mg by mouth See admin instructions. Take 1 capsule (100mg ) by mouth every morning and take 2 capsules (200mg ) by mouth at bedtime   hydrOXYzine 25 MG tablet Commonly known as: ATARAX Take 25 mg by mouth every 8 (eight) hours as needed.   ipratropium-albuterol 0.5-2.5 (3) MG/3ML Soln Commonly known as: DUONEB Take 3 mLs by nebulization every 6 (six) hours as needed.   latanoprost 0.005 % ophthalmic solution Commonly known as: XALATAN Place 1 drop into both eyes in the morning.   magnesium hydroxide 400 MG/5ML suspension Commonly known as: MILK OF MAGNESIA Take 30 mLs by mouth as needed for mild constipation.   Nitrostat 0.4 MG SL tablet Generic drug: nitroGLYCERIN DISSOLVE ONE TABLET  UNDER TONGUE AS NEEDED FOR CHEST PAIN - MAY REPEAT TWICE-IF NO RELIEF GO TO NEAREST HOSPITAL ER   nystatin powder Commonly known as: MYCOSTATIN/NYSTOP Apply 1 application topically daily.   potassium chloride SA 20 MEQ tablet Commonly known as: KLOR-CON M Take 1 tablet (20 mEq total) by mouth 2 (two) times daily. What changed:  when to take this reasons to take this   rosuvastatin 20 MG tablet Commonly known as: CRESTOR Take 20 mg by mouth daily.   torsemide 20 MG tablet Commonly known as: DEMADEX Take 1 tablet (20 mg total) by mouth 2 (two) times daily.   umeclidinium-vilanterol 62.5-25 MCG/ACT Aepb Commonly known as: ANORO ELLIPTA Inhale 1 puff into the lungs daily.        Follow-up Information     Tereso Newcomer T, PA-C Follow up on 10/14/2023.   Specialties: Cardiology, Physician Assistant Why: 2:20 PM Contact information: [address] [place] [address] [phone]         [facility] Schedule an appointment as soon as possible for a visit  in 1 week(s).   Contact information: [address] [place] [address] [phone]                Discharge Exam: Ceasar Mons Weights   09/26/23 1018  Weight: 83.5 kg   General.  Pleasant elderly lady, in no acute distress. Pulmonary.  Lungs clear bilaterally, normal respiratory effort. CV.  Bradycardia Abdomen.  Soft, nontender, nondistended, BS positive. CNS.  Alert and oriented .  No focal neurologic deficit. Extremities.  No edema, no cyanosis, pulses intact and symmetrical. Psychiatry.  Judgment and insight appears normal.   Condition at discharge: stable  The results of significant diagnostics from this hospitalization (including imaging, microbiology, ancillary and laboratory) are listed below for reference.   Imaging Studies: DG Chest 2 View  Result Date: 09/26/2023 CLINICAL DATA:  shob EXAM: CHEST - 2 VIEW COMPARISON:  01/03/2022. FINDINGS: Mildly  increased interstitial markings. Bilateral lung fields are otherwise clear. No acute consolidation or lung collapse. Bilateral costophrenic angles are clear. Stable cardio-mediastinal silhouette. Aortic arch calcifications noted. No acute osseous abnormalities. The soft tissues are within normal limits. IMPRESSION: 1. Mild interstitial edema. Electronically Signed   By: Jules Schick M.D.   On: 09/26/2023 12:19    Microbiology: Results for orders placed or performed during the hospital encounter of 01/03/22  Resp Panel by RT-PCR (Flu A&B, Covid) Nasopharyngeal Swab     Status: Abnormal   Collection Time: 01/03/22 12:51 PM   Specimen: Nasopharyngeal Swab; Nasopharyngeal(NP) swabs in vial transport medium  Result Value Ref Range Status   SARS Coronavirus 2 by RT PCR POSITIVE (A) NEGATIVE Final    Comment: (NOTE) SARS-CoV-2 target nucleic acids are DETECTED.  The SARS-CoV-2 RNA is generally detectable in upper respiratory specimens during the acute phase of infection. Positive results are indicative of the presence of the identified virus, but do not rule out bacterial infection or co-infection with other pathogens not detected by the test. Clinical correlation with patient history and other diagnostic information is necessary to determine patient infection status. The expected result is Negative.  Fact Sheet for Patients: BloggerCourse.com  Fact Sheet for Healthcare Providers: SeriousBroker.it  This test is not yet approved or cleared by the [place] FDA and  has been authorized for detection and/or diagnosis of SARS-CoV-2 by FDA under an Emergency Use Authorization (EUA).  This EUA will remain in effect (meaning this test can be used) for the duration of  the COVID-19 declaration under Section 564(b)(1) of the A ct, 21 U.S.C. section 360bbb-3(b)(1), unless the authorization is terminated or revoked sooner.     Influenza A by PCR  NEGATIVE NEGATIVE Final   Influenza B by PCR NEGATIVE NEGATIVE Final    Comment: (NOTE) The Xpert Xpress SARS-CoV-2/FLU/RSV plus assay is intended as an aid in the diagnosis of influenza from Nasopharyngeal swab specimens and should not be used as a sole basis for treatment. Nasal washings and aspirates are unacceptable for Xpert Xpress SARS-CoV-2/FLU/RSV testing.  Fact Sheet for Patients: BloggerCourse.com  Fact Sheet for Healthcare Providers: SeriousBroker.it  This test is not yet approved or cleared by the [place] FDA and has been authorized for detection and/or diagnosis of SARS-CoV-2 by FDA under an Emergency Use Authorization (EUA). This EUA will remain in effect (meaning this test can be used) for the duration of the COVID-19 declaration under Section 564(b)(1) of the Act, 21 U.S.C. section 360bbb-3(b)(1), unless the authorization is terminated or revoked.  Performed at [facility], [address] [place],  [place] [address]     Labs: CBC: Recent Labs  Lab 09/26/23 1021 09/27/23 0411 09/28/23 0858 09/30/23 0558  WBC 8.8 10.0 7.0 7.3  HGB 9.3* 9.3* 9.5* 10.0*  HCT 32.2* 30.8* 31.8* 31.9*  MCV 81.9 78.8* 80.3 76.3*  PLT 279 286 265 270   Basic Metabolic Panel: Recent Labs  Lab 09/26/23 1021 09/27/23 0411 09/28/23 0352 09/29/23 0358 09/30/23 0558  NA 139 136 135 133* 134*  K 4.5 4.2 4.5 3.6 3.5  CL 105 103 101 96* 96*  CO2 23 23 24 28 27   GLUCOSE 110* 110* 118* 125* 111*  BUN 37* 35* 40* 39* 45*  CREATININE 1.07* 1.36* 1.36* 1.57* 1.40*  CALCIUM 8.6* 8.0* 8.0* 7.6* 7.5*   Liver Function Tests: Recent Labs  Lab 09/26/23 1646  AST 31  ALT 47*  ALKPHOS 91  BILITOT 1.0  PROT 6.6  ALBUMIN 3.5   CBG: Recent Labs  Lab 09/27/23 0750 09/28/23 0806  GLUCAP 114* 114*    Discharge time spent: greater than 30 minutes.  This record has been created using Manufacturing engineer. Errors have been sought and corrected,but may not always be located. Such creation errors do not reflect on the standard of care.   Signed: Arnetha Courser, MD [facility] 09/30/2023

## 2023-09-30 NOTE — Consult Note (Signed)
Jenny [name] , MD [address], [address], [place], [place], [address] [address], [place], [place], [address] Phone: [phone]  Fax: [phone]  Consultation  Referring Provider:     Dr Nelson Chimes  Primary Care Physician:  [facility] Primary Gastroenterologist:  none          Reason for Consultation:     GI bleed  Date of Admission:  09/26/2023 Date of Consultation:  09/30/2023         HPI:   Jenny [name] is a 89 y.o. female with history of heart failure, atrial fibrillation on Eliquis, COPD, hyperlipidemia presented to the emergency room with shortness of breath.  She had chest pain at home resolved with nitroglycerin she was having dyspnea on exertion she had noticed black-colored stool over the weekend.  Admitted with acute on chronic heart failure.  Tachybradycardia syndrome found to be anemic and GI been consulted.   Baseline hemoglobin 1 year back was 14.2 g on admission was 9.3 with an MCV of 81.9. Ferritin of 10 B12 normal folate normal BMP at baseline.   She denies any blood loss: no hematemesis , melena, rectal bleeding , blood in urine or nasal bleeds. Denies any GI symptoms.    Past Medical History:  Diagnosis Date   ACUT DUOD ULCER W/HEMORR&PERF W/O MENTION OBST 10/05/2009   NSAID induced   ALLERGIC RHINITIS CAUSE UNSPECIFIED    ANEMIA-NOS    CAD (coronary artery disease) 06/08/2009   DEs RCA with 70% LAD and EF 60%   CHF (congestive heart failure) (HCC)    COPD    mild obst on PFTs 03/2010   Diabetes mellitus 06/2010 dx   Mild, diet controlled   GLAUCOMA    HYPERLIPIDEMIA    HYPERTENSION, BENIGN    MYOCARDIAL INFARCTION 06/08/2009   des to rca   Persistent atrial fibrillation (HCC)    Dx 08/2021   TOBACCO ABUSE     Past Surgical History:  Procedure Laterality Date   CARDIOVERSION N/A 01/30/2022   Procedure: CARDIOVERSION;  Surgeon: Jenny Hashimoto Deloris Ping, MD;  Location: [facility] ENDOSCOPY;  Service: Cardiovascular;  Laterality: N/A;    HEMORRHOID SURGERY  1990   Right knee surgery      Prior to Admission medications   Medication Sig Start Date End Date Taking? Authorizing Provider  acetaminophen (TYLENOL) 500 MG tablet Take 500 mg by mouth every 6 (six) hours as needed. Takes two tablets in the morning and two tablets at night   Yes [provider]  albuterol (VENTOLIN HFA) 108 (90 Base) MCG/ACT inhaler INHALE ONE PUFF EVERY 6 HOURS AS NEEDED FOR SHORTNESS OF BREATH 11/03/21  Yes Gilles Chiquito, MD  apixaban (ELIQUIS) 5 MG TABS tablet TAKE ONE TABLET TWICE DAILY 03/10/23  Yes Kathleene Hazel, MD  dorzolamide (TRUSOPT) 2 % ophthalmic solution Place 1 drop into both eyes 2 (two) times daily.   Yes [provider]  gabapentin (NEURONTIN) 100 MG capsule Take 100-200 mg by mouth See admin instructions. Take 1 capsule (100mg ) by mouth every morning and take 2 capsules (200mg ) by mouth at bedtime   Yes [provider]  hydrOXYzine (ATARAX) 25 MG tablet Take 25 mg by mouth every 8 (eight) hours as needed. 06/06/22  Yes [provider]  ipratropium-albuterol (DUONEB) 0.5-2.5 (3) MG/3ML SOLN Take 3 mLs by nebulization every 6 (six) hours as needed. 11/06/21  Yes Enedina Finner, MD  latanoprost (XALATAN) 0.005 % ophthalmic solution Place 1  drop into both eyes in the morning.   Yes [provider]  magnesium hydroxide (MILK OF MAGNESIA) 400 MG/5ML suspension Take 30 mLs by mouth as needed for mild constipation.   Yes [provider]  NITROSTAT 0.4 MG SL tablet DISSOLVE ONE TABLET UNDER TONGUE AS NEEDED FOR CHEST PAIN - MAY REPEAT TWICE-IF NO RELIEF GO TO NEAREST HOSPITAL ER 05/06/13  Yes Newt Lukes, MD  nystatin (MYCOSTATIN/NYSTOP) powder Apply 1 application topically daily. 01/22/22  Yes [provider]  potassium chloride SA (KLOR-CON M) 20 MEQ tablet Take 1 tablet (20 mEq total) by mouth 2 (two) times daily. Patient taking differently: Take 20 mEq by mouth as  needed (leg jerking). 02/04/23  Yes Clarisa Kindred A, FNP  predniSONE (DELTASONE) 20 MG tablet Take 20 mg by mouth daily with breakfast. 09/11/23  Yes [provider]  rosuvastatin (CRESTOR) 20 MG tablet Take 20 mg by mouth daily.   Yes [provider]  torsemide (DEMADEX) 20 MG tablet Take 2 tablets (40 mg total) by mouth 2 (two) times daily. Patient taking differently: Take 20 mg by mouth 2 (two) times daily. 02/04/23  Yes Hackney, Tina A, FNP  umeclidinium-vilanterol (ANORO ELLIPTA) 62.5-25 MCG/ACT AEPB Inhale 1 puff into the lungs daily.   Yes [provider]  Fluticasone-Umeclidin-Vilant (TRELEGY ELLIPTA) 100-62.5-25 MCG/INH AEPB Inhale 1 puff into the lungs daily. Patient not taking: Reported on 08/12/2023 08/27/21   Almon Hercules, MD    Family History  Problem Relation Age of Onset   Hypertension Mother    Ulcers Mother    Stomach cancer Father    Stroke Maternal Grandmother    Heart attack Neg Hx      Social History   Tobacco Use   Smoking status: Former    Current packs/day: 0.00    Average packs/day: 0.5 packs/day for 60.0 years (30.0 ttl pk-yrs)    Types: Cigarettes    Start date: 11/12/1961    Quit date: 11/12/2021    Years since quitting: 1.8   Smokeless tobacco: Never   Tobacco comments:    She lives in [place] with her significant other (Charles Hook)0  Vaping Use   Vaping status: Never Used  Substance Use Topics   Alcohol use: Yes    Alcohol/week: 1.0 - 2.0 standard drink of alcohol    Types: 1 - 2 Glasses of wine per week    Comment: once or twice a year glass of wine 12/25/21   Drug use: No    Allergies as of 09/26/2023 - Review Complete 09/26/2023  Allergen Reaction Noted   Entresto [sacubitril-valsartan] Shortness Of Breath 03/05/2023   Aspirin Other (See Comments) 01/25/2022   Atorvastatin Itching 10/12/2012   Cyclobenzaprine  09/03/2021   Lactose intolerance (gi) Other (See Comments) 08/23/2021   Meperidine hcl Other (See Comments)  07/12/2009   Pravastatin Rash 10/12/2012   Propoxyphene  09/03/2021   Wound dressing adhesive  11/19/2021    Review of Systems:    All systems reviewed and negative except where noted in HPI.   Physical Exam:  Vital signs in last 24 hours: Temp:  [97.6 F (36.4 C)-98.5 F (36.9 C)] 98.3 F (36.8 C) (10/22 0412) Pulse Rate:  [45-54] 45 (10/22 0412) Resp:  [18] 18 (10/22 0412) BP: (121-140)/(63-100) 126/75 (10/22 0412) SpO2:  [99 %-100 %] 99 % (10/22 0412) Last BM Date : 09/29/23 General:   Pleasant, cooperative in NAD Head:  Normocephalic and atraumatic. Lungs: Respirations even and unlabored. Lungs clear  to auscultation bilaterally.   No wheezes, crackles, or rhonchi.  Heart:S1s2 +  Without murmur, clicks, rubs or gallops Abdomen:  Soft, nondistended, nontender. Normal bowel sounds. No appreciable masses or hepatomegaly.  No rebound or guarding.  Neurologic:  Alert and oriented x3;  grossly normal neurologically. Skin:  Intact without significant lesions or rashes. Cervical Nodes:  No significant cervical adenopathy. Psych:  Alert and cooperative. Normal affect.  LAB RESULTS: Recent Labs    09/28/23 0858 09/30/23 0558  WBC 7.0 7.3  HGB 9.5* 10.0*  HCT 31.8* 31.9*  PLT 265 270   BMET Recent Labs    09/28/23 0352 09/29/23 0358 09/30/23 0558  NA 135 133* 134*  K 4.5 3.6 3.5  CL 101 96* 96*  CO2 24 28 27   GLUCOSE 118* 125* 111*  BUN 40* 39* 45*  CREATININE 1.36* 1.57* 1.40*  CALCIUM 8.0* 7.6* 7.5*   LFT No results for input(s): "PROT", "ALBUMIN", "AST", "ALT", "ALKPHOS", "BILITOT", "BILIDIR", "IBILI" in the last 72 hours. PT/INR No results for input(s): "LABPROT", "INR" in the last 72 hours.  STUDIES: No results found.    Impression / Plan:   KHAMIAH BENEDICTO is a 89 y.o. y/o female with history of paroxysmal atrial fibrillation heart failure CKD admitted with shortness of breath bradycardia junctional rhythm with atrial flutter.  Admission preceded with a  few days of melena.  Patient was on Eliquis last dose was in 2018 2024.  Labs suggest iron deficiency anemia with a ferritin that is very low.  Impression: With iron deficiency anemia and low ferritin with history of melena would warrant EGD and colonoscopy but she clearly stated she didn't want any procedures  Plan 1.  Monitor CBC transfuse as needed 2.  IV iron as inpatient and follow-up as an outpatient for further IV iron 3.  Patient doesn't want any GI procedures as of now , if she changes her mind please let me know.    I will sign off.  Please call me if any further GI concerns or questions.  We would like to thank you for the opportunity to participate in the care of QUANTAYA SHINDER.   Thank you for involving me in the care of this patient.      LOS: 4 days   Jenny Mood, MD  09/30/2023, 8:36 AM

## 2023-09-30 NOTE — Care Management Important Message (Signed)
Important Message  Patient Details  Name: Jenny [name] MRN: [id number] Date of Birth: [birth year]   Important Message Given:  Yes - Medicare IM     Darolyn Rua, LCSW 09/30/2023, 11:20 AM

## 2023-10-05 IMAGING — US US RENAL
1 series · 14 of 25 positions shown · non-contrast
Comparison: None.

CLINICAL DATA: Chronic kidney disease.  Type 2 diabetes.

EXAM:
RENAL / URINARY TRACT ULTRASOUND COMPLETE

[Series 1: us renal · 0.23mm/px · 14 of 47 slices shown]
[im 1/47]
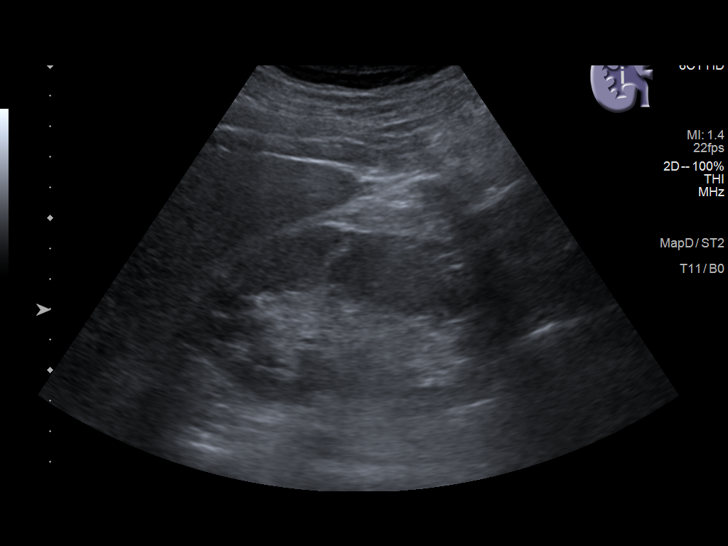
[im 4/47]
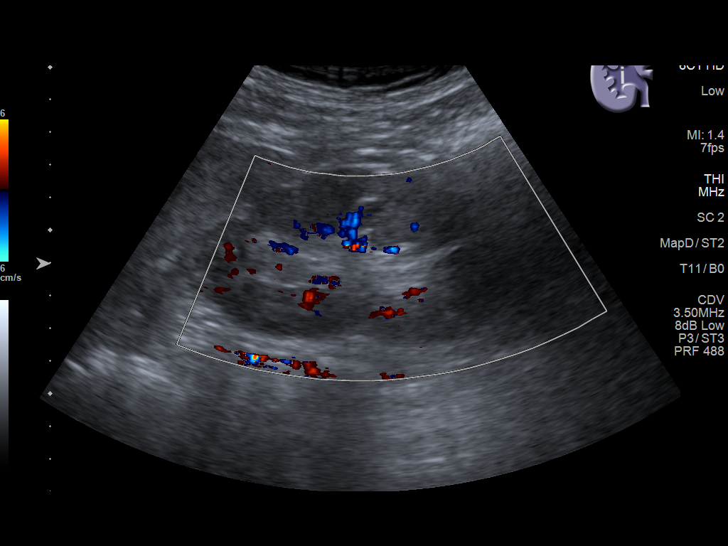
[im 8/47]
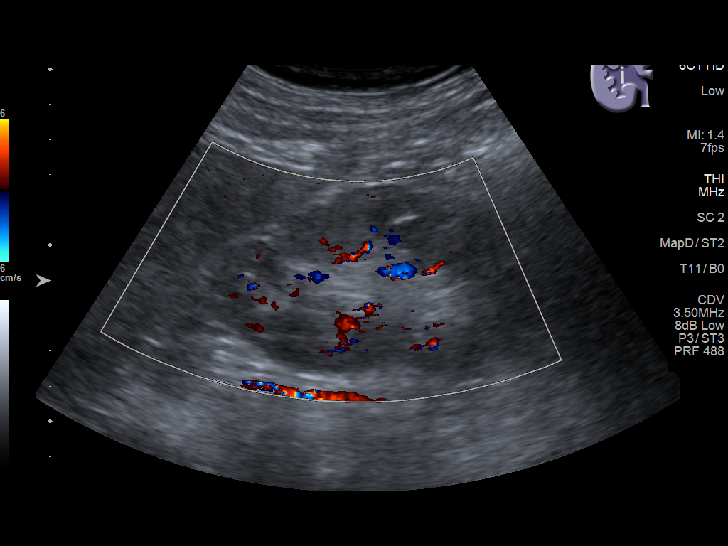
[im 12/47]
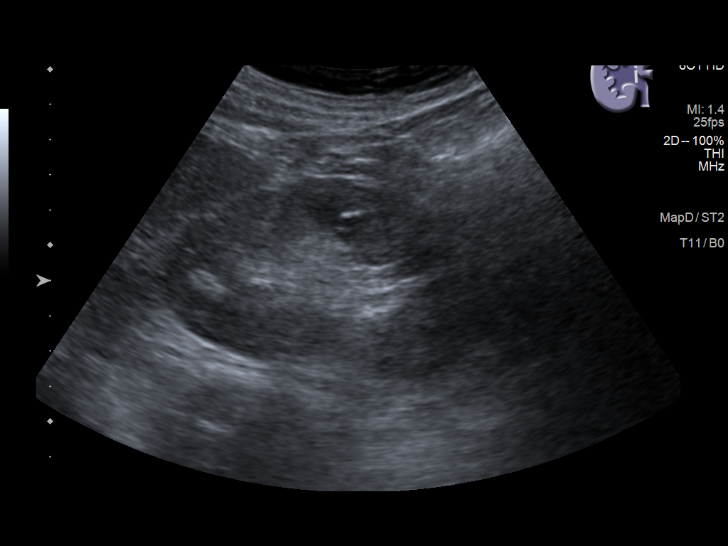
[im 16/47]
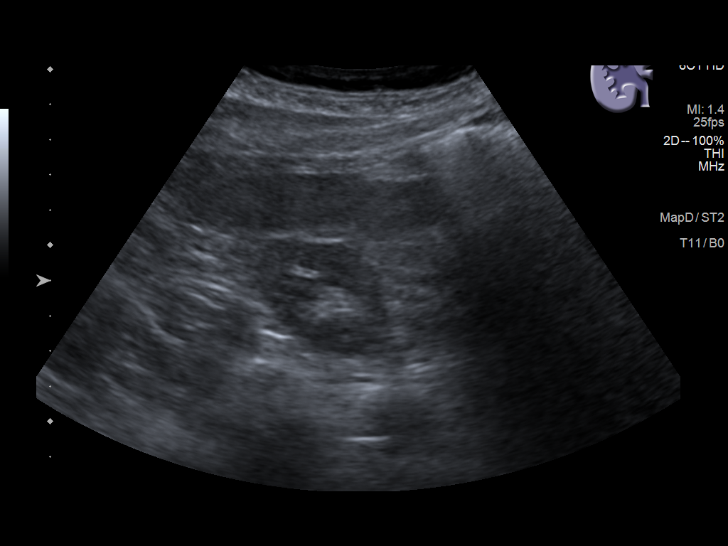
[im 18/47]
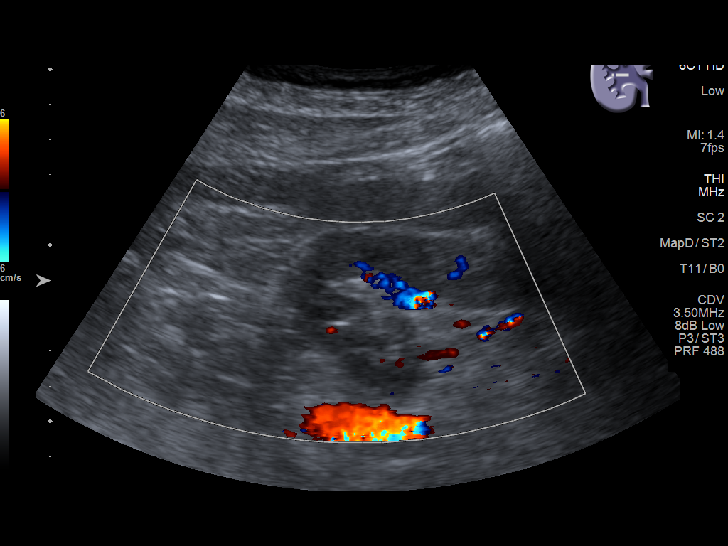
[im 22/47]
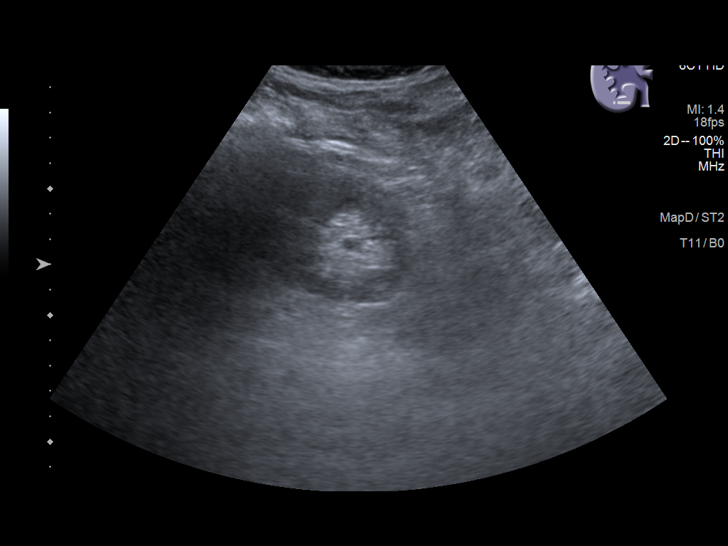
[im 25/47]
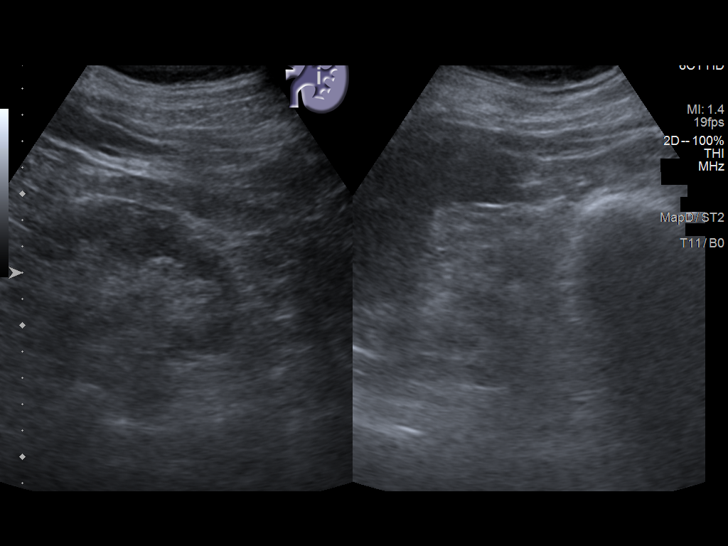
[im 29/47]
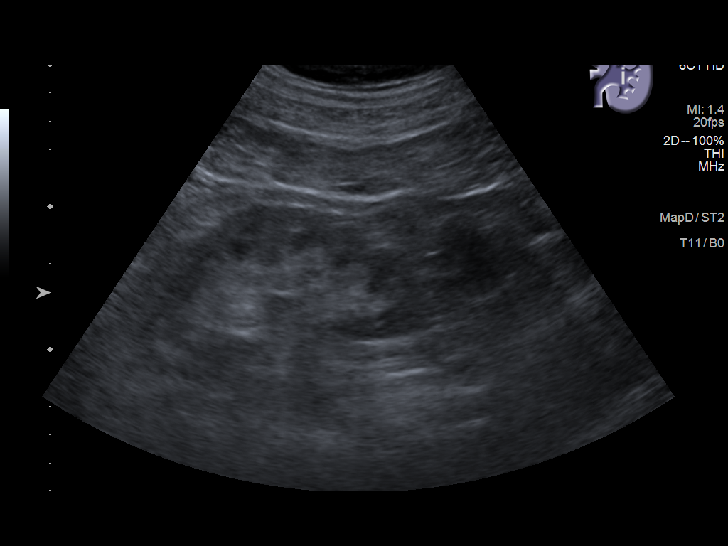
[im 31/47]
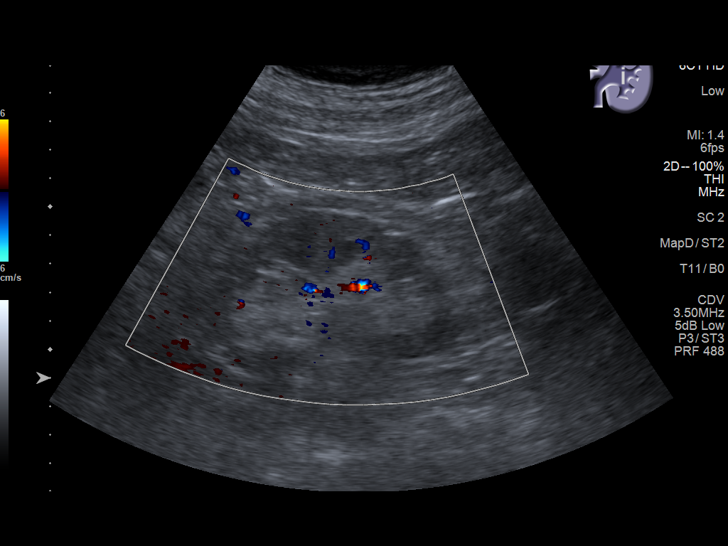
[im 35/47]
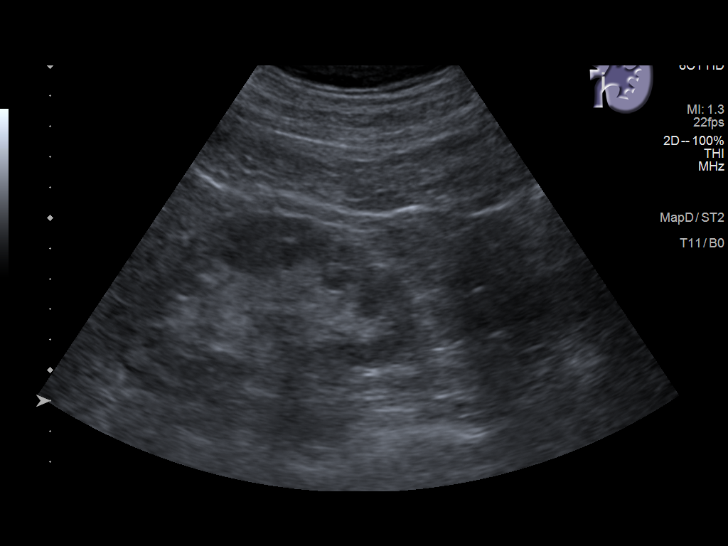
[im 39/47]
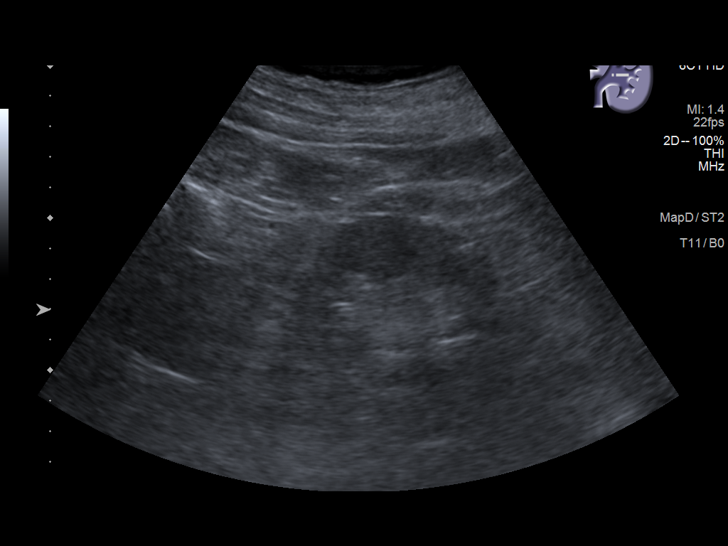
[im 43/47]
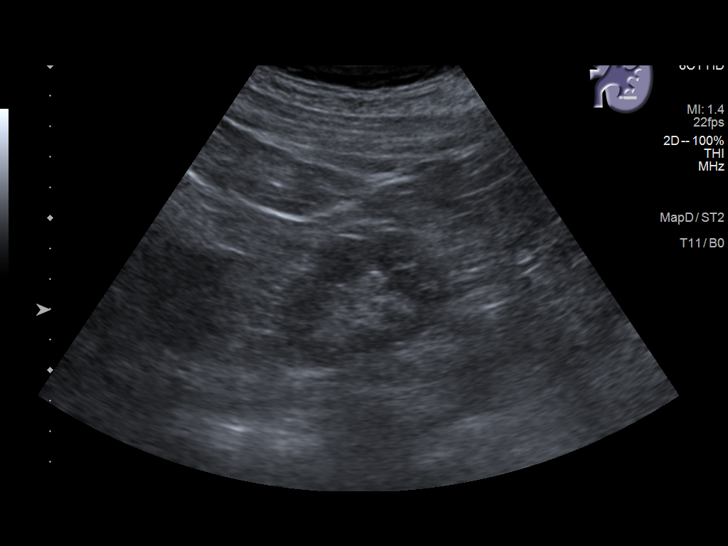
[im 47/47]
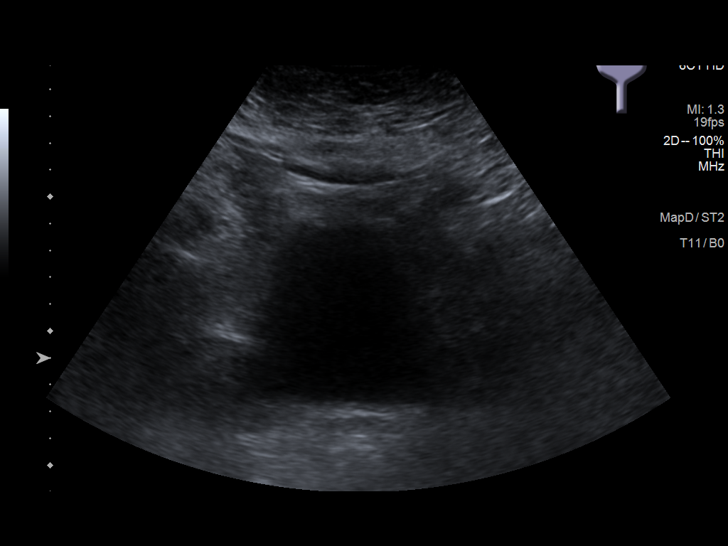

[14 of 25 positions shown; findings below may reference images not displayed]

FINDINGS: Right Kidney:

Renal measurements: 9.9 x 4.8 x 4.6 cm = volume: 116 mL.
Echogenicity within normal limits. No mass or hydronephrosis
visualized.

Left Kidney:

Renal measurements: 9.8 x 5.0 x 4.9 cm = volume: 123 mL.
Echogenicity within normal limits. No mass or hydronephrosis
visualized.

Bladder:

Appears normal for degree of bladder distention.

Other:

None.
IMPRESSION: Normal examination.

## 2023-10-09 ENCOUNTER — Other Ambulatory Visit: Payer: Self-pay

## 2023-10-09 ENCOUNTER — Emergency Department
Admission: EM | Admit: 2023-10-09 | Discharge: 2023-10-10 | Disposition: A | Discharge status: Home / Self Care | Payer: Medicare Other | Attending: Emergency Medicine | Admitting: Emergency Medicine

## 2023-10-09 DIAGNOSIS — R54 Age-related physical debility: Secondary | ICD-10-CM | POA: Diagnosis not present

## 2023-10-09 DIAGNOSIS — D649 Anemia, unspecified: Secondary | ICD-10-CM | POA: Diagnosis not present

## 2023-10-09 DIAGNOSIS — Z7901 Long term (current) use of anticoagulants: Secondary | ICD-10-CM | POA: Diagnosis not present

## 2023-10-09 DIAGNOSIS — E1142 Type 2 diabetes mellitus with diabetic polyneuropathy: Secondary | ICD-10-CM | POA: Diagnosis not present

## 2023-10-09 DIAGNOSIS — N184 Chronic kidney disease, stage 4 (severe): Secondary | ICD-10-CM | POA: Diagnosis not present

## 2023-10-09 DIAGNOSIS — E785 Hyperlipidemia, unspecified: Secondary | ICD-10-CM | POA: Diagnosis not present

## 2023-10-09 DIAGNOSIS — K625 Hemorrhage of anus and rectum: Secondary | ICD-10-CM | POA: Insufficient documentation

## 2023-10-09 DIAGNOSIS — J449 Chronic obstructive pulmonary disease, unspecified: Secondary | ICD-10-CM | POA: Diagnosis not present

## 2023-10-09 DIAGNOSIS — K5900 Constipation, unspecified: Secondary | ICD-10-CM | POA: Diagnosis not present

## 2023-10-09 DIAGNOSIS — I130 Hypertensive heart and chronic kidney disease with heart failure and stage 1 through stage 4 chronic kidney disease, or unspecified chronic kidney disease: Secondary | ICD-10-CM | POA: Diagnosis not present

## 2023-10-09 DIAGNOSIS — R001 Bradycardia, unspecified: Secondary | ICD-10-CM | POA: Diagnosis not present

## 2023-10-09 DIAGNOSIS — I25119 Atherosclerotic heart disease of native coronary artery with unspecified angina pectoris: Secondary | ICD-10-CM | POA: Diagnosis not present

## 2023-10-09 DIAGNOSIS — F331 Major depressive disorder, recurrent, moderate: Secondary | ICD-10-CM | POA: Diagnosis not present

## 2023-10-09 DIAGNOSIS — I5030 Unspecified diastolic (congestive) heart failure: Secondary | ICD-10-CM | POA: Diagnosis not present

## 2023-10-09 DIAGNOSIS — E1151 Type 2 diabetes mellitus with diabetic peripheral angiopathy without gangrene: Secondary | ICD-10-CM | POA: Diagnosis not present

## 2023-10-09 DIAGNOSIS — E1122 Type 2 diabetes mellitus with diabetic chronic kidney disease: Secondary | ICD-10-CM | POA: Diagnosis not present

## 2023-10-09 LAB — CBC
HCT: 40.4 % (ref 36.0–46.0)
Hemoglobin: 12.3 g/dL (ref 12.0–15.0)
MCH: 25.5 pg — ABNORMAL LOW (ref 26.0–34.0)
MCHC: 30.4 g/dL (ref 30.0–36.0)
MCV: 83.8 fL (ref 80.0–100.0)
Platelets: 285 10*3/uL (ref 150–400)
RBC: 4.82 MIL/uL (ref 3.87–5.11)
RDW: 22.4 % — ABNORMAL HIGH (ref 11.5–15.5)
WBC: 6.3 10*3/uL (ref 4.0–10.5)
nRBC: 0 % (ref 0.0–0.2)

## 2023-10-09 LAB — TYPE AND SCREEN
ABO/RH(D): O POS
Antibody Screen: NEGATIVE

## 2023-10-09 LAB — COMPREHENSIVE METABOLIC PANEL
ALT: 14 U/L (ref 0–44)
AST: 18 U/L (ref 15–41)
Albumin: 3.9 g/dL (ref 3.5–5.0)
Alkaline Phosphatase: 103 U/L (ref 38–126)
Anion gap: 12 (ref 5–15)
BUN: 43 mg/dL — ABNORMAL HIGH (ref 8–23)
CO2: 32 mmol/L (ref 22–32)
Calcium: 8 mg/dL — ABNORMAL LOW (ref 8.9–10.3)
Chloride: 96 mmol/L — ABNORMAL LOW (ref 98–111)
Creatinine, Ser: 1.41 mg/dL — ABNORMAL HIGH (ref 0.44–1.00)
GFR, Estimated: 36 mL/min — ABNORMAL LOW (ref >60)
Glucose, Bld: 108 mg/dL — ABNORMAL HIGH (ref 70–99)
Potassium: 3.6 mmol/L (ref 3.5–5.1)
Sodium: 140 mmol/L (ref 135–145)
Total Bilirubin: 0.8 mg/dL (ref 0.3–1.2)
Total Protein: 7.2 g/dL (ref 6.5–8.1)

## 2023-10-09 NOTE — ED Triage Notes (Signed)
Pt to ED via pov C/o bloody stools. Pt reports being in hospital for same last week. Was due to have endoscopy but she refused. Pt having black tarry stools since Sunday. Denies CP, SOB, fevers, dizziness

## 2023-10-10 DIAGNOSIS — R5381 Other malaise: Secondary | ICD-10-CM | POA: Diagnosis not present

## 2023-10-10 DIAGNOSIS — K625 Hemorrhage of anus and rectum: Secondary | ICD-10-CM | POA: Diagnosis not present

## 2023-10-10 DIAGNOSIS — Z23 Encounter for immunization: Secondary | ICD-10-CM | POA: Diagnosis not present

## 2023-10-10 DIAGNOSIS — Z8719 Personal history of other diseases of the digestive system: Secondary | ICD-10-CM | POA: Diagnosis not present

## 2023-10-10 DIAGNOSIS — I5030 Unspecified diastolic (congestive) heart failure: Secondary | ICD-10-CM | POA: Diagnosis not present

## 2023-10-10 DIAGNOSIS — K5901 Slow transit constipation: Secondary | ICD-10-CM | POA: Diagnosis not present

## 2023-10-10 DIAGNOSIS — R001 Bradycardia, unspecified: Secondary | ICD-10-CM | POA: Diagnosis not present

## 2023-10-10 NOTE — ED Notes (Signed)
Patient given discharge instructions including importance of stopping eliquis until follow up with cards. Patient stated understanding. Stable and wheeled out to await ride from son Kathlene November on dispo.

## 2023-10-10 NOTE — Discharge Instructions (Signed)
As we discussed, although you are continuing to have some brown stools as you did previously when you were in the hospital, your blood work actually looks better than it did before, and your blood level (hemoglobin) is going up, not down.  There is no need for you to stay in the hospital at this time, but we strongly encourage you to call the office of Dr. Tobi Bastos (gastroenterology) and explained that he saw you while you are in the hospital for blood in your stool and you would like to schedule the next available follow-up appointment.  Similarly, you need to follow-up with your primary care doctor and your cardiologist (as previously scheduled).  Continue taking your regular medications although we recommend you continue to hold off on your Eliquis, at least until you have your cardiology appointment.  Return to the emergency department if you develop new or worsening symptoms that concern you.

## 2023-10-10 NOTE — ED Provider Notes (Signed)
[facility] Ed Provider Note    Event Date/Time   First MD Initiated Contact with Patient 10/09/23 2354     (approximate)   History   Rectal Bleeding   HPI Jenny [name] is a 89 y.o. female who was recently admitted and discharged from the hospital (about a week ago) for a constellation of issues including CHF exacerbation but during that hospitalization she was also evaluated for GI bleeding.  At the time she declined any additional workup including endoscopy/colonoscopy.  The plan is for her to follow-up as an outpatient and to hold her Eliquis for now.  She presents tonight for evaluation of dark stools.  She said that she is frequently constipated and did not have a bowel movement for a couple of days.  She initially had a small amount of stool that was dark but she assumed she was just clearing out what she had in there.  She did not have a bowel movement for a few days until she took some mag citrate today.  At that point she had a large bowel movement and said that it was all black.  Other than that she feels well.  She has had no pain.  No vomiting.  She says she feels generally weak and tired but that has not changed since her last hospitalization.  No episodes of passing out or dizziness.     Physical Exam   Triage Vital Signs: ED Triage Vitals  Encounter Vitals Group     BP 10/09/23 1850 (!) 141/47     Systolic BP Percentile --      Diastolic BP Percentile --      Pulse Rate 10/09/23 1850 (!) 48     Resp 10/09/23 1850 16     Temp 10/09/23 1850 97.8 F (36.6 C)     Temp Source 10/09/23 1850 Oral     SpO2 10/09/23 1850 98 %     Weight 10/09/23 1850 83.5 kg (184 lb)     Height 10/09/23 1850 1.575 m (5\' 2" )     Head Circumference --      Peak Flow --      Pain Score 10/09/23 1905 0     Pain Loc --      Pain Education --      Exclude from Growth Chart --     Most recent vital signs: Vitals:   10/10/23 0030 10/10/23 0100  BP: (!) 148/50 (!)  148/50  Pulse: (!) 36 (!) 39  Resp:  16  Temp:    SpO2: 98% 99%    General: Awake, no distress.  Generally well-appearing and appears younger than chronological age. CV:  Good peripheral perfusion.  Bradycardia with irregular rhythm. Resp:  Normal effort. Speaking easily and comfortably, no accessory muscle usage nor intercostal retractions.   Abd:  No distention.  Morbid obesity.  No tenderness to palpation of the abdomen. Other:  Alert and oriented, no confusion that I can appreciate.   ED Results / Procedures / Treatments   Labs (all labs ordered are listed, but only abnormal results are displayed) Labs Reviewed  COMPREHENSIVE METABOLIC PANEL - Abnormal; Notable for the following components:      Result Value   Chloride 96 (*)    Glucose, Bld 108 (*)    BUN 43 (*)    Creatinine, Ser 1.41 (*)    Calcium 8.0 (*)    GFR, Estimated 36 (*)    All other components within normal limits  CBC - Abnormal; Notable for the following components:   MCH 25.5 (*)    RDW 22.4 (*)    All other components within normal limits  POC OCCULT BLOOD, ED  TYPE AND SCREEN     EKG  ED ECG REPORT I, Loleta Rose, the attending physician, personally viewed and interpreted this ECG.  Date: 10/10/2023 EKG Time: 00: 54 Rate: 47 Rhythm: Atrial fibrillation QRS Axis: normal Intervals: Abnormal due to atrial fibrillation ST/T Wave abnormalities: Non-specific ST segment / T-wave changes, but no clear evidence of acute ischemia. Narrative Interpretation: no definitive evidence of acute ischemia; does not meet STEMI criteria.    PROCEDURES:  Critical Care performed: No  Procedures    IMPRESSION / MDM / ASSESSMENT AND PLAN / ED COURSE  I reviewed the triage vital signs and the nursing notes.                              Differential diagnosis includes, but is not limited to, bleeding secondary to Eliquis use, neoplasm, AV malformation, diverticulosis, upper GI bleed.  Patient's  presentation is most consistent with acute presentation with potential threat to life or bodily function.  Labs/studies ordered: CMP, CBC, type and screen  Interventions/Medications given:  Medications - No data to display  (Note:  hospital course my include additional interventions and/or labs/studies not listed above.)   Patient's vital signs are normal other than bradycardia.  She has a history of chronic atrial fibrillation (which was the reason for her Eliquis) and although she is bradycardic, she is asymptomatic from it.  No indication for additional treatment.  I had a long discussion with the patient about the black stool and explained that this may continue and may be intermittent.  However, fortunately her hemoglobin has increased since her hospitalization and that this is likely a long term process.  There is no indication she needs emergent intervention at this time and she can follow-up with a gastroenterologist as an outpatient to discuss whether she would benefit from further assessment with outpatient endoscopy or colonoscopy (it may be a risky procedure given her comorbidities).  She understands and agrees with the plan and will follow-up as recommended.  I gave my usual and customary return precautions.  I considered hospitalization, but given that she was just hospitalized and she actually seems to be doing better now than she was at the time, it seems appropriate that she follow-up as an outpatient.  Of note, I reviewed the discharge summary written by Dr. Nelson Chimes on 10/22 and confirmed that the patient was evaluated by cardiology initially with concerns for tacky-bradycardia syndrome, then concern for junctional rhythm, and considered for pacemaker, but they decided to hold off.  The bradycardia we are seeing today is consistent with what she has had for at least a couple of weeks and what she was evaluated and cleared for by cardiology when she was in the hospital.          FINAL CLINICAL IMPRESSION(S) / ED DIAGNOSES   Final diagnoses:  Rectal bleeding  Constipation, unspecified constipation type  Bradycardia     Rx / DC Orders   ED Discharge Orders          Ordered    Ambulatory referral to Cardiology       Comments: If you have not heard from the Cardiology office within the next 72 hours please call [phone].   10/10/23 0122  Note:  This document was prepared using Dragon voice recognition software and may include unintentional dictation errors.   Loleta Rose, MD 10/10/23 [phone]

## 2023-10-13 DIAGNOSIS — E785 Hyperlipidemia, unspecified: Secondary | ICD-10-CM | POA: Diagnosis not present

## 2023-10-13 DIAGNOSIS — I5032 Chronic diastolic (congestive) heart failure: Secondary | ICD-10-CM | POA: Diagnosis not present

## 2023-10-13 DIAGNOSIS — I495 Sick sinus syndrome: Secondary | ICD-10-CM | POA: Diagnosis not present

## 2023-10-13 DIAGNOSIS — I4819 Other persistent atrial fibrillation: Secondary | ICD-10-CM | POA: Diagnosis not present

## 2023-10-13 DIAGNOSIS — I252 Old myocardial infarction: Secondary | ICD-10-CM | POA: Diagnosis not present

## 2023-10-13 DIAGNOSIS — I441 Atrioventricular block, second degree: Secondary | ICD-10-CM | POA: Diagnosis not present

## 2023-10-13 DIAGNOSIS — D649 Anemia, unspecified: Secondary | ICD-10-CM | POA: Diagnosis not present

## 2023-10-13 DIAGNOSIS — I2510 Atherosclerotic heart disease of native coronary artery without angina pectoris: Secondary | ICD-10-CM | POA: Diagnosis not present

## 2023-10-13 DIAGNOSIS — I110 Hypertensive heart disease with heart failure: Secondary | ICD-10-CM | POA: Diagnosis not present

## 2023-10-13 DIAGNOSIS — E1142 Type 2 diabetes mellitus with diabetic polyneuropathy: Secondary | ICD-10-CM | POA: Diagnosis not present

## 2023-10-13 DIAGNOSIS — E669 Obesity, unspecified: Secondary | ICD-10-CM | POA: Diagnosis not present

## 2023-10-13 DIAGNOSIS — J449 Chronic obstructive pulmonary disease, unspecified: Secondary | ICD-10-CM | POA: Diagnosis not present

## 2023-10-14 ENCOUNTER — Encounter: Payer: Self-pay | Admitting: Physician Assistant

## 2023-10-14 ENCOUNTER — Telehealth: Payer: Self-pay | Admitting: Physician Assistant

## 2023-10-14 ENCOUNTER — Ambulatory Visit: Payer: Medicare Other | Attending: Physician Assistant | Admitting: Physician Assistant

## 2023-10-14 VITALS — BP 140/52 | HR 43 | Ht 62.0 in | Wt 194.6 lb

## 2023-10-14 DIAGNOSIS — E1122 Type 2 diabetes mellitus with diabetic chronic kidney disease: Secondary | ICD-10-CM | POA: Diagnosis not present

## 2023-10-14 DIAGNOSIS — I130 Hypertensive heart and chronic kidney disease with heart failure and stage 1 through stage 4 chronic kidney disease, or unspecified chronic kidney disease: Secondary | ICD-10-CM | POA: Diagnosis not present

## 2023-10-14 DIAGNOSIS — D509 Iron deficiency anemia, unspecified: Secondary | ICD-10-CM | POA: Insufficient documentation

## 2023-10-14 DIAGNOSIS — Z6835 Body mass index (BMI) 35.0-35.9, adult: Secondary | ICD-10-CM | POA: Diagnosis not present

## 2023-10-14 DIAGNOSIS — I5032 Chronic diastolic (congestive) heart failure: Secondary | ICD-10-CM | POA: Diagnosis not present

## 2023-10-14 DIAGNOSIS — J449 Chronic obstructive pulmonary disease, unspecified: Secondary | ICD-10-CM | POA: Diagnosis not present

## 2023-10-14 DIAGNOSIS — R001 Bradycardia, unspecified: Secondary | ICD-10-CM | POA: Insufficient documentation

## 2023-10-14 DIAGNOSIS — E1151 Type 2 diabetes mellitus with diabetic peripheral angiopathy without gangrene: Secondary | ICD-10-CM | POA: Diagnosis not present

## 2023-10-14 DIAGNOSIS — N184 Chronic kidney disease, stage 4 (severe): Secondary | ICD-10-CM | POA: Diagnosis not present

## 2023-10-14 DIAGNOSIS — H54414A Blindness right eye category 4, normal vision left eye: Secondary | ICD-10-CM | POA: Diagnosis not present

## 2023-10-14 DIAGNOSIS — I2510 Atherosclerotic heart disease of native coronary artery without angina pectoris: Secondary | ICD-10-CM | POA: Insufficient documentation

## 2023-10-14 DIAGNOSIS — Z604 Social exclusion and rejection: Secondary | ICD-10-CM | POA: Diagnosis not present

## 2023-10-14 DIAGNOSIS — I2729 Other secondary pulmonary hypertension: Secondary | ICD-10-CM | POA: Diagnosis not present

## 2023-10-14 DIAGNOSIS — Z556 Problems related to health literacy: Secondary | ICD-10-CM | POA: Diagnosis not present

## 2023-10-14 DIAGNOSIS — I4819 Other persistent atrial fibrillation: Secondary | ICD-10-CM | POA: Insufficient documentation

## 2023-10-14 DIAGNOSIS — D631 Anemia in chronic kidney disease: Secondary | ICD-10-CM | POA: Diagnosis not present

## 2023-10-14 DIAGNOSIS — E785 Hyperlipidemia, unspecified: Secondary | ICD-10-CM | POA: Diagnosis not present

## 2023-10-14 DIAGNOSIS — Z8719 Personal history of other diseases of the digestive system: Secondary | ICD-10-CM | POA: Diagnosis not present

## 2023-10-14 DIAGNOSIS — I872 Venous insufficiency (chronic) (peripheral): Secondary | ICD-10-CM | POA: Diagnosis not present

## 2023-10-14 DIAGNOSIS — F331 Major depressive disorder, recurrent, moderate: Secondary | ICD-10-CM | POA: Diagnosis not present

## 2023-10-14 DIAGNOSIS — N1831 Chronic kidney disease, stage 3a: Secondary | ICD-10-CM | POA: Insufficient documentation

## 2023-10-14 DIAGNOSIS — I5030 Unspecified diastolic (congestive) heart failure: Secondary | ICD-10-CM | POA: Diagnosis not present

## 2023-10-14 DIAGNOSIS — K5901 Slow transit constipation: Secondary | ICD-10-CM | POA: Diagnosis not present

## 2023-10-14 MED ORDER — APIXABAN 5 MG PO TABS: 5.0000 mg | ORAL_TABLET | Freq: Two times a day (BID) | ORAL | 3 refills | Status: DC

## 2023-10-14 MED ORDER — TORSEMIDE 20 MG PO TABS: ORAL_TABLET | ORAL | Status: DC

## 2023-10-14 MED ORDER — PANTOPRAZOLE SODIUM 40 MG PO TBEC: 40.0000 mg | DELAYED_RELEASE_TABLET | Freq: Every day | ORAL | 3 refills | Status: DC

## 2023-10-14 NOTE — Progress Notes (Signed)
Cardiology Office Note:    Date:  10/14/2023  ID:  Jenny [name], DOB 11-29-1933, MRN [id number] PCP: [facility]  [facility] Providers Cardiologist:  Verne Carrow, MD       Patient Profile:      Coronary artery disease  Inf MI in 06/2009 s/p 2.5 x 23 mm DES to RCA LHC 07/08/2009: LAD proximal 70, mid 70, D1 70; LCx mid 40, distal 50; RCA proximal 99 (PCI); EF 60 Myoview 12/21/2013: No ischemia Persistent atrial fibrillation  S/p DCCV in 01/2022 >> post DCCV 2:1 AVB 2nd degree AVB Type 1 beta-blocker DC'd 2/2 to bradycardia in 08/2021 EP eval (Dr. Graciela Husbands) 2023 >> rec ischemic eval before PPM - pt declined cardiac catheterization  (HFpEF) heart failure with preserved ejection fraction  Admx 08/2021, 10/2021 w acute CHF, AECOPD >> referred to [facility]  Intol of Entresto 2/2 ? dyspnea  TTE 04/09/2023: EF 65-70, no RWMA, GR 2 DD, normal RVSF, severe pulmonary hypertension, mild LAE, moderate RAE, trivial MR, mild to moderate TR, AV sclerosis, RAP 15, RVSP 62.9 Carotid artery disease  Carotid US 12/17/2018: Bilateral ICA 1-39 No follow up due to adv age  Pulmonary hypertension  TTE 04/2023: RVSP 62.9 Hyperlipidemia  Hypertension  Chronic kidney disease  Diabetes mellitus  Chronic Obstructive Pulmonary Disease  Chronic shortness of breath  Tobacco use  Anemia  ABIs 01/24/2023: Normal         History of Present Illness:  Discussed the use of AI scribe software for clinical note transcription with the patient, who gave verbal consent to proceed.  Jenny [name] is a 89 y.o. female who returns for follow-up of CAD, CHF, A-fib.  She Last saw Dr. Clifton James in 01/2023.  She was admitted to [facility] 10/18-10/22 with worsening shortness of breath.  She was noted to have vascular congestion on chest x-ray was given 1 dose of IV Lasix.  Troponins were minimally elevated without trend, not consistent with ACS.  She was seen by cardiology  for bradycardia.  EKG demonstrated atrial fibrillation with slow ventricular response.  She was also noted to be anemic with complaints of melena.  She was seen by EP.  There were no indications for pacemaker.  The patient's hemoglobin remained stable.  She was given a dose of IV iron.  Discharge summary indicates the patient was to hold Eliquis until cardiology follow-up with plans to resume if hemoglobin remained stable.  Recommendation was to pursue cardioversion once she is back on uninterrupted anticoagulation for 3 to 4 weeks. Home health called our office earlier today to let us know that the patient is significantly short of breath.    She is here with her son.  She went back to the emergency room 10/09/2023 with black stools.  Hemoglobin was normal at that time.The patient reports increased shortness of breath, particularly in the morning, which improved after taking a breathing treatment. She also reports sleeping in a sitting position due to difficulty breathing when lying flat.  She has slept like this for a long time. The patient denied experiencing chest pain or pressure. She has not been weighing at home.  Her scale is not working.  By our scales, she is up 10 pounds.     Review of Systems  Gastrointestinal:  Negative for hematochezia.  Genitourinary:  Negative for hematuria.  See HPI     Studies Reviewed:   EKG Interpretation Date/Time:  Tuesday October 14 2023 14:18:14  EST Ventricular Rate:  43 PR Interval:    QRS Duration:  74 QT Interval:  484 QTC Calculation: 408 R Axis:   270  Text Interpretation: Atrial fibrillation with slow ventricular response Right superior axis deviation Low voltage QRS Inferior infarct Cannot rule out Anterolateral infarct No significant change since last tracing Confirmed by Tereso Newcomer [phone]) on 10/14/2023 2:26:35 PM      Labs-Chart Review 09/26/2023: Hemoglobin 9.3 09/30/2023: Hemoglobin 10.0 10/09/2023: Hemoglobin 12.3     Risk  Assessment/Calculations:    CHA2DS2-VASc Score = 7   This indicates a 11.2% annual risk of stroke. The patient's score is based upon: CHF History: 1 HTN History: 1 Diabetes History: 1 Stroke History: 0 Vascular Disease History: 1 Age Score: 2 Gender Score: 1           Physical Exam:   VS:  BP (!) 140/52   Pulse (!) 43   Ht 5\' 2"  (1.575 m)   Wt 194 lb 9.6 oz (88.3 kg)   SpO2 93%   BMI 35.59 kg/m    Wt Readings from Last 3 Encounters:  10/14/23 194 lb 9.6 oz (88.3 kg)  10/09/23 184 lb (83.5 kg)  09/26/23 184 lb (83.5 kg)    Constitutional:      Appearance: Not in distress. Chronically ill-appearing.  Neck:     Vascular: No JVR. JVD normal.  Pulmonary:     Breath sounds: Normal breath sounds. No wheezing. No rales.  Cardiovascular:     Bradycardia present. Regular rhythm.     Murmurs: There is no murmur.  Edema:    Peripheral edema present.    Pretibial: bilateral 2+ edema of the pretibial area. Abdominal:     Palpations: Abdomen is soft.        Assessment and Plan:   Assessment & Plan Chronic heart failure with preserved ejection fraction (HCC) Echocardiogram in May 2024 with EF 65-70.  She recently noted worsening shortness of breath.  By our scales, her weight is up 10 pounds since discharge from the hospital.  She seems to be somewhat volume overloaded.  She is NYHA III. -Increase torsemide to 40 mg in the morning and 20 mg in the afternoon x 2 days -Then resume torsemide 20 mg twice daily -Weigh daily and notify us if weight increases more than 3 pounds in 1 day Coronary artery disease involving native coronary artery of native heart without angina pectoris History of inferior MI in July 2010 treated a DES to the RCA.  She had a nonischemic Myoview in 2015.  She has declined proceeding with cardiac catheterization. Persistent atrial fibrillation (HCC) She remains in atrial fibrillation with slow ventricular rate.  She has been off of Eliquis since her  hospitalization.  I reviewed the hospital notes.  She did have Hemoccult positive stool x 1.  Her hemoglobin was as low as 9.3 but improved to 10 prior to discharge.  She went back to the emergency room 10/31 with black stools and her hemoglobin was normal at 12.3.  She has been taking iron.  She was given IV iron in the hospital.  According to the notes, she declined colonoscopy/EGD.  The patient's son notes that they were told that a colonoscopy and EGD would be too risky.  She does have a CHADS2-VASc score of 7.  We discussed her annual risk of stroke being 11%.  She is concerned about recurrent bleeding.  She does not want to see the gastroenterologist in [place].  Since she did  have melena, she likely had an upper GI bleed.  We discussed the potential for starting on proton pump inhibitor therapy to see if this could help prevent recurrent bleeding.  She would like to try this. -Start Protonix 40 mg daily -Then, restart Eliquis 5 mg twice daily -Arrange H&H weekly x 4 weeks -If recurrent melena or drop in hemoglobin, stop Eliquis Symptomatic bradycardia She has seen EP in the past with recommendations to pursue ischemic workup prior to pursuing pacemaker implantation.  She has declined ischemic workup.  There was no indication for pacemaker during her recent hospitalization.  She has not had any syncope.  Continue to monitor. Iron deficiency anemia, unspecified iron deficiency anemia type Recent hemoglobin normal.  She remains on iron therapy.  As noted, I will place her on a proton pump inhibitor and then restart her Eliquis.  If she has recurrent signs of bleeding, would encourage her to see gastroenterology. Other secondary pulmonary hypertension (HCC) Likely related to COPD. Stage 3a chronic kidney disease (HCC) Recent creatinine remained stable.       Dispo:  Return in 3 months (on 01/26/2024) for Scheduled Follow Up, w/ Dr. Clifton James.  Signed, Tereso Newcomer, PA-C

## 2023-10-14 NOTE — Telephone Encounter (Signed)
Aimee from [facility] call to advise that pt was very SOB this morning at her visit with her. Wanted to make Dr Alben Spittle aware since pt has appt this afternoon. HR 52 reg brady BP 138/60 O2 99% Weight 194 Aimee is not requesting cb but can be reached at [phone]

## 2023-10-14 NOTE — Assessment & Plan Note (Signed)
She remains in atrial fibrillation with slow ventricular rate.  She has been off of Eliquis since her hospitalization.  I reviewed the hospital notes.  She did have Hemoccult positive stool x 1.  Her hemoglobin was as low as 9.3 but improved to 10 prior to discharge.  She went back to the emergency room 10/31 with black stools and her hemoglobin was normal at 12.3.  She has been taking iron.  She was given IV iron in the hospital.  According to the notes, she declined colonoscopy/EGD.  The patient's son notes that they were told that a colonoscopy and EGD would be too risky.  She does have a CHADS2-VASc score of 7.  We discussed her annual risk of stroke being 11%.  She is concerned about recurrent bleeding.  She does not want to see the gastroenterologist in [place].  Since she did have melena, she likely had an upper GI bleed.  We discussed the potential for starting on proton pump inhibitor therapy to see if this could help prevent recurrent bleeding.  She would like to try this. -Start Protonix 40 mg daily -Then, restart Eliquis 5 mg twice daily -Arrange H&H weekly x 4 weeks -If recurrent melena or drop in hemoglobin, stop Eliquis

## 2023-10-14 NOTE — Assessment & Plan Note (Signed)
History of inferior MI in July 2010 treated a DES to the RCA.  She had a nonischemic Myoview in 2015.  She has declined proceeding with cardiac catheterization.

## 2023-10-14 NOTE — Assessment & Plan Note (Signed)
She has seen EP in the past with recommendations to pursue ischemic workup prior to pursuing pacemaker implantation.  She has declined ischemic workup.  There was no indication for pacemaker during her recent hospitalization.  She has not had any syncope.  Continue to monitor.

## 2023-10-14 NOTE — Patient Instructions (Signed)
Medication Instructions:  Your physician has recommended you make the following change in your medication:   START Protonix 40 mg taking 1 daily  INCREASE the Torsemide to 20 taking 2 in the am, 1 in the pm, for 2 days then go back to 20 mg 1 twice a day 1 WEEK AFTER PROTONIX IS STARTED, START ELIQUIS BACK AT 5 MG TWICE A DAY  *If you need a refill on your cardiac medications before your next appointment, please call your pharmacy*   Lab Work: None ordered today.  Jenny [name] is aware to draw Hemoglobin once a week starting 10/27/23 for 4 weeks. If you have labs (blood work) drawn today and your tests are completely normal, you will receive your results only by: MyChart Message (if you have MyChart) OR A paper copy in the mail If you have any lab test that is abnormal or we need to change your treatment, we will call you to review the results.   Testing/Procedures: None ordered   Follow-Up: At [facility], you and your health needs are our priority.  As part of our continuing mission to provide you with exceptional heart care, we have created designated Provider Care Teams.  These Care Teams include your primary Cardiologist (physician) and Advanced Practice Providers (APPs -  Physician Assistants and Nurse Practitioners) who all work together to provide you with the care you need, when you need it.  We recommend signing up for the patient portal called "MyChart".  Sign up information is provided on this After Visit Summary.  MyChart is used to connect with patients for Virtual Visits (Telemedicine).  Patients are able to view lab/test results, encounter notes, upcoming appointments, etc.  Non-urgent messages can be sent to your provider as well.   To learn more about what you can do with MyChart, go to [email or web address].    Your next appointment:   As scheduled  Provider:   Verne Carrow, MD     Other Instructions Jenny [name] has already been made aware of the blood  work that we will need.

## 2023-10-14 NOTE — Assessment & Plan Note (Signed)
Likely related to COPD.

## 2023-10-14 NOTE — Assessment & Plan Note (Signed)
Echocardiogram in May 2024 with EF 65-70.  She recently noted worsening shortness of breath.  By our scales, her weight is up 10 pounds since discharge from the hospital.  She seems to be somewhat volume overloaded.  She is NYHA III. -Increase torsemide to 40 mg in the morning and 20 mg in the afternoon x 2 days -Then resume torsemide 20 mg twice daily -Weigh daily and notify us if weight increases more than 3 pounds in 1 day

## 2023-10-14 NOTE — Assessment & Plan Note (Signed)
Recent hemoglobin normal.  She remains on iron therapy.  As noted, I will place her on a proton pump inhibitor and then restart her Eliquis.  If she has recurrent signs of bleeding, would encourage her to see gastroenterology.

## 2023-10-16 DIAGNOSIS — I5030 Unspecified diastolic (congestive) heart failure: Secondary | ICD-10-CM | POA: Diagnosis not present

## 2023-10-16 DIAGNOSIS — I4819 Other persistent atrial fibrillation: Secondary | ICD-10-CM | POA: Diagnosis not present

## 2023-10-16 DIAGNOSIS — I130 Hypertensive heart and chronic kidney disease with heart failure and stage 1 through stage 4 chronic kidney disease, or unspecified chronic kidney disease: Secondary | ICD-10-CM | POA: Diagnosis not present

## 2023-10-16 DIAGNOSIS — N184 Chronic kidney disease, stage 4 (severe): Secondary | ICD-10-CM | POA: Diagnosis not present

## 2023-10-16 DIAGNOSIS — E1122 Type 2 diabetes mellitus with diabetic chronic kidney disease: Secondary | ICD-10-CM | POA: Diagnosis not present

## 2023-10-16 DIAGNOSIS — D631 Anemia in chronic kidney disease: Secondary | ICD-10-CM | POA: Diagnosis not present

## 2023-10-21 DIAGNOSIS — I441 Atrioventricular block, second degree: Secondary | ICD-10-CM | POA: Diagnosis not present

## 2023-10-21 DIAGNOSIS — I110 Hypertensive heart disease with heart failure: Secondary | ICD-10-CM | POA: Diagnosis not present

## 2023-10-21 DIAGNOSIS — I5030 Unspecified diastolic (congestive) heart failure: Secondary | ICD-10-CM | POA: Diagnosis not present

## 2023-10-21 DIAGNOSIS — E1142 Type 2 diabetes mellitus with diabetic polyneuropathy: Secondary | ICD-10-CM | POA: Diagnosis not present

## 2023-10-21 DIAGNOSIS — E1122 Type 2 diabetes mellitus with diabetic chronic kidney disease: Secondary | ICD-10-CM | POA: Diagnosis not present

## 2023-10-21 DIAGNOSIS — I130 Hypertensive heart and chronic kidney disease with heart failure and stage 1 through stage 4 chronic kidney disease, or unspecified chronic kidney disease: Secondary | ICD-10-CM | POA: Diagnosis not present

## 2023-10-21 DIAGNOSIS — I4819 Other persistent atrial fibrillation: Secondary | ICD-10-CM | POA: Diagnosis not present

## 2023-10-21 DIAGNOSIS — N184 Chronic kidney disease, stage 4 (severe): Secondary | ICD-10-CM | POA: Diagnosis not present

## 2023-10-21 DIAGNOSIS — D631 Anemia in chronic kidney disease: Secondary | ICD-10-CM | POA: Diagnosis not present

## 2023-10-21 DIAGNOSIS — I495 Sick sinus syndrome: Secondary | ICD-10-CM | POA: Diagnosis not present

## 2023-10-21 DIAGNOSIS — J449 Chronic obstructive pulmonary disease, unspecified: Secondary | ICD-10-CM | POA: Diagnosis not present

## 2023-10-21 DIAGNOSIS — I5032 Chronic diastolic (congestive) heart failure: Secondary | ICD-10-CM | POA: Diagnosis not present

## 2023-10-21 DIAGNOSIS — E669 Obesity, unspecified: Secondary | ICD-10-CM | POA: Diagnosis not present

## 2023-10-21 DIAGNOSIS — I252 Old myocardial infarction: Secondary | ICD-10-CM | POA: Diagnosis not present

## 2023-10-21 DIAGNOSIS — E785 Hyperlipidemia, unspecified: Secondary | ICD-10-CM | POA: Diagnosis not present

## 2023-10-21 DIAGNOSIS — D649 Anemia, unspecified: Secondary | ICD-10-CM | POA: Diagnosis not present

## 2023-10-21 DIAGNOSIS — I2510 Atherosclerotic heart disease of native coronary artery without angina pectoris: Secondary | ICD-10-CM | POA: Diagnosis not present

## 2023-10-23 DIAGNOSIS — N184 Chronic kidney disease, stage 4 (severe): Secondary | ICD-10-CM | POA: Diagnosis not present

## 2023-10-23 DIAGNOSIS — I130 Hypertensive heart and chronic kidney disease with heart failure and stage 1 through stage 4 chronic kidney disease, or unspecified chronic kidney disease: Secondary | ICD-10-CM | POA: Diagnosis not present

## 2023-10-23 DIAGNOSIS — I5030 Unspecified diastolic (congestive) heart failure: Secondary | ICD-10-CM | POA: Diagnosis not present

## 2023-10-23 DIAGNOSIS — D631 Anemia in chronic kidney disease: Secondary | ICD-10-CM | POA: Diagnosis not present

## 2023-10-23 DIAGNOSIS — I4819 Other persistent atrial fibrillation: Secondary | ICD-10-CM | POA: Diagnosis not present

## 2023-10-23 DIAGNOSIS — E1122 Type 2 diabetes mellitus with diabetic chronic kidney disease: Secondary | ICD-10-CM | POA: Diagnosis not present

## 2023-10-28 DIAGNOSIS — I130 Hypertensive heart and chronic kidney disease with heart failure and stage 1 through stage 4 chronic kidney disease, or unspecified chronic kidney disease: Secondary | ICD-10-CM | POA: Diagnosis not present

## 2023-10-28 DIAGNOSIS — D649 Anemia, unspecified: Secondary | ICD-10-CM | POA: Diagnosis not present

## 2023-10-28 DIAGNOSIS — E1122 Type 2 diabetes mellitus with diabetic chronic kidney disease: Secondary | ICD-10-CM | POA: Diagnosis not present

## 2023-10-28 DIAGNOSIS — N184 Chronic kidney disease, stage 4 (severe): Secondary | ICD-10-CM | POA: Diagnosis not present

## 2023-10-28 DIAGNOSIS — D631 Anemia in chronic kidney disease: Secondary | ICD-10-CM | POA: Diagnosis not present

## 2023-10-28 DIAGNOSIS — I4819 Other persistent atrial fibrillation: Secondary | ICD-10-CM | POA: Diagnosis not present

## 2023-10-28 DIAGNOSIS — I5030 Unspecified diastolic (congestive) heart failure: Secondary | ICD-10-CM | POA: Diagnosis not present

## 2023-10-30 DIAGNOSIS — I130 Hypertensive heart and chronic kidney disease with heart failure and stage 1 through stage 4 chronic kidney disease, or unspecified chronic kidney disease: Secondary | ICD-10-CM | POA: Diagnosis not present

## 2023-10-30 DIAGNOSIS — N184 Chronic kidney disease, stage 4 (severe): Secondary | ICD-10-CM | POA: Diagnosis not present

## 2023-10-30 DIAGNOSIS — E1142 Type 2 diabetes mellitus with diabetic polyneuropathy: Secondary | ICD-10-CM | POA: Diagnosis not present

## 2023-10-30 DIAGNOSIS — E1151 Type 2 diabetes mellitus with diabetic peripheral angiopathy without gangrene: Secondary | ICD-10-CM | POA: Diagnosis not present

## 2023-10-30 DIAGNOSIS — I4819 Other persistent atrial fibrillation: Secondary | ICD-10-CM | POA: Diagnosis not present

## 2023-10-30 DIAGNOSIS — I25119 Atherosclerotic heart disease of native coronary artery with unspecified angina pectoris: Secondary | ICD-10-CM | POA: Diagnosis not present

## 2023-10-30 DIAGNOSIS — R54 Age-related physical debility: Secondary | ICD-10-CM | POA: Diagnosis not present

## 2023-10-30 DIAGNOSIS — F331 Major depressive disorder, recurrent, moderate: Secondary | ICD-10-CM | POA: Diagnosis not present

## 2023-10-30 DIAGNOSIS — E1122 Type 2 diabetes mellitus with diabetic chronic kidney disease: Secondary | ICD-10-CM | POA: Diagnosis not present

## 2023-10-30 DIAGNOSIS — I5030 Unspecified diastolic (congestive) heart failure: Secondary | ICD-10-CM | POA: Diagnosis not present

## 2023-10-30 DIAGNOSIS — D649 Anemia, unspecified: Secondary | ICD-10-CM | POA: Diagnosis not present

## 2023-10-30 DIAGNOSIS — J449 Chronic obstructive pulmonary disease, unspecified: Secondary | ICD-10-CM | POA: Diagnosis not present

## 2023-10-30 DIAGNOSIS — E785 Hyperlipidemia, unspecified: Secondary | ICD-10-CM | POA: Diagnosis not present

## 2023-10-30 DIAGNOSIS — D631 Anemia in chronic kidney disease: Secondary | ICD-10-CM | POA: Diagnosis not present

## 2023-10-30 LAB — LAB REPORT - SCANNED
A1c: 6.2
EGFR: 32

## 2023-11-03 DIAGNOSIS — N184 Chronic kidney disease, stage 4 (severe): Secondary | ICD-10-CM | POA: Diagnosis not present

## 2023-11-03 DIAGNOSIS — D631 Anemia in chronic kidney disease: Secondary | ICD-10-CM | POA: Diagnosis not present

## 2023-11-03 DIAGNOSIS — K922 Gastrointestinal hemorrhage, unspecified: Secondary | ICD-10-CM | POA: Diagnosis not present

## 2023-11-03 DIAGNOSIS — D649 Anemia, unspecified: Secondary | ICD-10-CM | POA: Diagnosis not present

## 2023-11-03 DIAGNOSIS — I4819 Other persistent atrial fibrillation: Secondary | ICD-10-CM | POA: Diagnosis not present

## 2023-11-03 DIAGNOSIS — I130 Hypertensive heart and chronic kidney disease with heart failure and stage 1 through stage 4 chronic kidney disease, or unspecified chronic kidney disease: Secondary | ICD-10-CM | POA: Diagnosis not present

## 2023-11-03 DIAGNOSIS — I5030 Unspecified diastolic (congestive) heart failure: Secondary | ICD-10-CM | POA: Diagnosis not present

## 2023-11-03 DIAGNOSIS — E1122 Type 2 diabetes mellitus with diabetic chronic kidney disease: Secondary | ICD-10-CM | POA: Diagnosis not present

## 2023-11-04 DIAGNOSIS — W5503XA Scratched by cat, initial encounter: Secondary | ICD-10-CM | POA: Diagnosis not present

## 2023-11-04 DIAGNOSIS — S81802A Unspecified open wound, left lower leg, initial encounter: Secondary | ICD-10-CM | POA: Diagnosis not present

## 2023-11-05 ENCOUNTER — Telehealth: Payer: Self-pay | Admitting: *Deleted

## 2023-11-05 NOTE — Telephone Encounter (Signed)
-----   Message from Tereso Newcomer sent at 11/04/2023  5:15 PM EST ----- Labs from PCP from 10/28/23 reviewed.  Hgb normal/stable. Her Creatinine is stable at 1.54. Based on age > 44 and Creatinine 1.5 or higher, we should reduce her Eliquis dose to 2.5 mg. PLAN:  -Decrease Eliquis to 2.5 mg twice daily Tereso Newcomer, PA-C    11/04/2023 5:17 PM

## 2023-11-05 NOTE — Telephone Encounter (Signed)
Call placed to pt regarding message below, no answer / no voicemail.

## 2023-11-08 DIAGNOSIS — E669 Obesity, unspecified: Secondary | ICD-10-CM | POA: Diagnosis not present

## 2023-11-08 DIAGNOSIS — E1142 Type 2 diabetes mellitus with diabetic polyneuropathy: Secondary | ICD-10-CM | POA: Diagnosis not present

## 2023-11-08 DIAGNOSIS — I441 Atrioventricular block, second degree: Secondary | ICD-10-CM | POA: Diagnosis not present

## 2023-11-08 DIAGNOSIS — I252 Old myocardial infarction: Secondary | ICD-10-CM | POA: Diagnosis not present

## 2023-11-08 DIAGNOSIS — I5032 Chronic diastolic (congestive) heart failure: Secondary | ICD-10-CM | POA: Diagnosis not present

## 2023-11-08 DIAGNOSIS — D649 Anemia, unspecified: Secondary | ICD-10-CM | POA: Diagnosis not present

## 2023-11-08 DIAGNOSIS — I4819 Other persistent atrial fibrillation: Secondary | ICD-10-CM | POA: Diagnosis not present

## 2023-11-08 DIAGNOSIS — I495 Sick sinus syndrome: Secondary | ICD-10-CM | POA: Diagnosis not present

## 2023-11-08 DIAGNOSIS — J449 Chronic obstructive pulmonary disease, unspecified: Secondary | ICD-10-CM | POA: Diagnosis not present

## 2023-11-08 DIAGNOSIS — I2510 Atherosclerotic heart disease of native coronary artery without angina pectoris: Secondary | ICD-10-CM | POA: Diagnosis not present

## 2023-11-08 DIAGNOSIS — E785 Hyperlipidemia, unspecified: Secondary | ICD-10-CM | POA: Diagnosis not present

## 2023-11-08 DIAGNOSIS — I110 Hypertensive heart disease with heart failure: Secondary | ICD-10-CM | POA: Diagnosis not present

## 2023-11-10 ENCOUNTER — Telehealth: Payer: Self-pay | Admitting: Family

## 2023-11-10 DIAGNOSIS — D631 Anemia in chronic kidney disease: Secondary | ICD-10-CM | POA: Diagnosis not present

## 2023-11-10 DIAGNOSIS — N184 Chronic kidney disease, stage 4 (severe): Secondary | ICD-10-CM | POA: Diagnosis not present

## 2023-11-10 DIAGNOSIS — I4819 Other persistent atrial fibrillation: Secondary | ICD-10-CM | POA: Diagnosis not present

## 2023-11-10 DIAGNOSIS — I5030 Unspecified diastolic (congestive) heart failure: Secondary | ICD-10-CM | POA: Diagnosis not present

## 2023-11-10 DIAGNOSIS — K922 Gastrointestinal hemorrhage, unspecified: Secondary | ICD-10-CM | POA: Diagnosis not present

## 2023-11-10 DIAGNOSIS — D649 Anemia, unspecified: Secondary | ICD-10-CM | POA: Diagnosis not present

## 2023-11-10 DIAGNOSIS — I130 Hypertensive heart and chronic kidney disease with heart failure and stage 1 through stage 4 chronic kidney disease, or unspecified chronic kidney disease: Secondary | ICD-10-CM | POA: Diagnosis not present

## 2023-11-10 DIAGNOSIS — E1122 Type 2 diabetes mellitus with diabetic chronic kidney disease: Secondary | ICD-10-CM | POA: Diagnosis not present

## 2023-11-10 MED ORDER — APIXABAN 2.5 MG PO TABS: 2.5000 mg | ORAL_TABLET | Freq: Two times a day (BID) | ORAL | 3 refills | Status: DC

## 2023-11-10 NOTE — Telephone Encounter (Signed)
2.5mg  Eliquis RX faxed to [facility] patient assistance program.

## 2023-11-10 NOTE — Telephone Encounter (Signed)
Pt aware to reduce the Eliquis to 2.5 mg bid.  New rx sent to [facility].

## 2023-11-11 DIAGNOSIS — I5030 Unspecified diastolic (congestive) heart failure: Secondary | ICD-10-CM | POA: Diagnosis not present

## 2023-11-11 DIAGNOSIS — N184 Chronic kidney disease, stage 4 (severe): Secondary | ICD-10-CM | POA: Diagnosis not present

## 2023-11-11 DIAGNOSIS — E1122 Type 2 diabetes mellitus with diabetic chronic kidney disease: Secondary | ICD-10-CM | POA: Diagnosis not present

## 2023-11-11 DIAGNOSIS — I130 Hypertensive heart and chronic kidney disease with heart failure and stage 1 through stage 4 chronic kidney disease, or unspecified chronic kidney disease: Secondary | ICD-10-CM | POA: Diagnosis not present

## 2023-11-11 DIAGNOSIS — I4819 Other persistent atrial fibrillation: Secondary | ICD-10-CM | POA: Diagnosis not present

## 2023-11-11 DIAGNOSIS — D631 Anemia in chronic kidney disease: Secondary | ICD-10-CM | POA: Diagnosis not present

## 2023-11-14 DIAGNOSIS — R54 Age-related physical debility: Secondary | ICD-10-CM | POA: Diagnosis not present

## 2023-11-14 DIAGNOSIS — Z0000 Encounter for general adult medical examination without abnormal findings: Secondary | ICD-10-CM | POA: Diagnosis not present

## 2023-11-18 DIAGNOSIS — I5030 Unspecified diastolic (congestive) heart failure: Secondary | ICD-10-CM | POA: Diagnosis not present

## 2023-11-18 DIAGNOSIS — I130 Hypertensive heart and chronic kidney disease with heart failure and stage 1 through stage 4 chronic kidney disease, or unspecified chronic kidney disease: Secondary | ICD-10-CM | POA: Diagnosis not present

## 2023-11-20 DIAGNOSIS — I252 Old myocardial infarction: Secondary | ICD-10-CM | POA: Diagnosis not present

## 2023-11-20 DIAGNOSIS — I441 Atrioventricular block, second degree: Secondary | ICD-10-CM | POA: Diagnosis not present

## 2023-11-20 DIAGNOSIS — I5032 Chronic diastolic (congestive) heart failure: Secondary | ICD-10-CM | POA: Diagnosis not present

## 2023-11-20 DIAGNOSIS — J449 Chronic obstructive pulmonary disease, unspecified: Secondary | ICD-10-CM | POA: Diagnosis not present

## 2023-11-20 DIAGNOSIS — E669 Obesity, unspecified: Secondary | ICD-10-CM | POA: Diagnosis not present

## 2023-11-20 DIAGNOSIS — I495 Sick sinus syndrome: Secondary | ICD-10-CM | POA: Diagnosis not present

## 2023-11-20 DIAGNOSIS — D649 Anemia, unspecified: Secondary | ICD-10-CM | POA: Diagnosis not present

## 2023-11-20 DIAGNOSIS — E1142 Type 2 diabetes mellitus with diabetic polyneuropathy: Secondary | ICD-10-CM | POA: Diagnosis not present

## 2023-11-20 DIAGNOSIS — I2510 Atherosclerotic heart disease of native coronary artery without angina pectoris: Secondary | ICD-10-CM | POA: Diagnosis not present

## 2023-11-20 DIAGNOSIS — I4819 Other persistent atrial fibrillation: Secondary | ICD-10-CM | POA: Diagnosis not present

## 2023-11-20 DIAGNOSIS — E785 Hyperlipidemia, unspecified: Secondary | ICD-10-CM | POA: Diagnosis not present

## 2023-11-20 DIAGNOSIS — I110 Hypertensive heart disease with heart failure: Secondary | ICD-10-CM | POA: Diagnosis not present

## 2023-11-26 ENCOUNTER — Ambulatory Visit (INDEPENDENT_AMBULATORY_CARE_PROVIDER_SITE_OTHER): Payer: Medicare Other | Admitting: Podiatry

## 2023-11-26 ENCOUNTER — Encounter: Payer: Self-pay | Admitting: Podiatry

## 2023-11-26 DIAGNOSIS — E1151 Type 2 diabetes mellitus with diabetic peripheral angiopathy without gangrene: Secondary | ICD-10-CM

## 2023-11-26 DIAGNOSIS — M79675 Pain in left toe(s): Secondary | ICD-10-CM

## 2023-11-26 DIAGNOSIS — B351 Tinea unguium: Secondary | ICD-10-CM

## 2023-11-26 DIAGNOSIS — M79674 Pain in right toe(s): Secondary | ICD-10-CM

## 2023-11-26 DIAGNOSIS — D689 Coagulation defect, unspecified: Secondary | ICD-10-CM

## 2023-11-26 NOTE — Progress Notes (Signed)
Subjective:  Patient ID: Jenny [name], female    DOB: May 09, 1933,   MRN: [id number]  No chief complaint on file.   89 y.o. female presents for concern of thickened elongated and painful nails that are difficult to trim. Requesting to have them trimmed today.  She has been dealing with toenail fungus for 30+ years. Does relates some pain in her feet after walking for periods.  Relates burning and tingling in their feet. Patient is diabetic and last A1c was  Lab Results  Component Value Date   HGBA1C 6.9 (H) 09/26/2023   .   PCP:  [facility]    . Denies any other pedal complaints. Denies n/v/f/c.   Past Medical History:  Diagnosis Date   ACUT DUOD ULCER W/HEMORR&PERF W/O MENTION OBST 10/05/2009   NSAID induced   ALLERGIC RHINITIS CAUSE UNSPECIFIED    ANEMIA-NOS    CAD (coronary artery disease) 06/08/2009   DEs RCA with 70% LAD and EF 60%   CHF (congestive heart failure) (HCC)    COPD    mild obst on PFTs 03/2010   Diabetes mellitus 06/2010 dx   Mild, diet controlled   GLAUCOMA    HYPERLIPIDEMIA    HYPERTENSION, BENIGN    MYOCARDIAL INFARCTION 06/08/2009   des to rca   Persistent atrial fibrillation (HCC)    Dx 08/2021   TOBACCO ABUSE     Objective:  Physical Exam: Vascular: DP/PT pulses 0/4 bilateral. CFT <3 seconds. Absent hair growth on digits. Edema noted to bilateral lower extremities. Xerosis noted bilaterally. Erythema noted to distal extremities as well.  Skin. No lacerations or abrasions bilateral feet. Nails 1-5 bilateral  are thickened discolored and elongated with subungual debris.  Musculoskeletal: MMT 5/5 bilateral lower extremities in DF, PF, Inversion and Eversion. Deceased ROM in DF of ankle joint.  Neurological: Sensation intact to light touch. Protective sensation diminished bilateral.     LOWER EXTREMITY DOPPLER STUDY  Patient Name:  GRACEMARIE YONKE  Date of Exam:   01/24/2023 Medical Rec #: [id number]      Accession #:     [id number] Date of Birth: 1933-04-02      Patient Gender: F Patient Age:   51 years Exam Location:  [facility] Procedure:      VAS [place] ABI WITH/WO TBI Referring Phys: Sahand Gosch   --------------------------------------------------------------------------- -----   Indications: Patient reports years of bilateral leg pain that goes from hips              down to feet. It starts after about a block of walking. Patient              also reports the base of her spine is detoriating.  High Risk Factors: Hypertension, hyperlipidemia, Diabetes, past history of                    smoking, prior MI, coronary artery disease.  Other Factors: Afib.  Comparison Study: NA  Performing Technologist: Jeryl Columbia RDCS    Examination Guidelines: A complete evaluation includes at minimum, Doppler waveform signals and systolic blood pressure reading at the level of bilateral brachial, anterior tibial, and posterior tibial arteries, when vessel segments are accessible. Bilateral testing is considered an integral part of a complete examination. Photoelectric Plethysmograph (PPG) waveforms and toe systolic pressure readings are included as required and additional duplex testing as needed. Limited examinations for reoccurring indications may be performed as noted.    ABI Findings: +---------+------------------+-----+--------+--------+ Right  Rt Pressure (mmHg)IndexWaveformComment  +---------+------------------+-----+--------+--------+ Brachial 142                                     +---------+------------------+-----+--------+--------+ PTA      154               1.08 biphasic         +---------+------------------+-----+--------+--------+ PERO     122               0.86 biphasic         +---------+------------------+-----+--------+--------+ DP       116               0.82 biphasic         +---------+------------------+-----+--------+--------+ Great  Toe115               0.81 Normal           +---------+------------------+-----+--------+--------+  +---------+------------------+-----+--------+-------+ Left     Lt Pressure (mmHg)IndexWaveformComment +---------+------------------+-----+--------+-------+ Brachial 139                                    +---------+------------------+-----+--------+-------+ PTA      145               1.02 biphasic        +---------+------------------+-----+--------+-------+ PERO     135               0.95 biphasic        +---------+------------------+-----+--------+-------+ DP       141               0.99 biphasic        +---------+------------------+-----+--------+-------+ Great Toe146               1.03 Normal          +---------+------------------+-----+--------+-------+  +-------+-----------+-----------+------------+------------+ ABI/TBIToday's ABIToday's TBIPrevious ABIPrevious TBI +-------+-----------+-----------+------------+------------+ Right  1.08       0.81                                +-------+-----------+-----------+------------+------------+ Left   1.02       1.03                                +-------+-----------+-----------+------------+------------+          Summary: Right: Resting right ankle-brachial index is within normal range. The right toe-brachial index is normal.  Left: Resting left ankle-brachial index is within normal range. The left toe-brachial index is normal.     Assessment:   1. Pain due to onychomycosis of toenails of both feet   2. Type II diabetes mellitus with peripheral circulatory disorder (HCC)   3. Coagulation defect (HCC)       Plan:  Patient was evaluated and treated and all questions answered. -Discussed and educated patient on diabetic foot care, especially with  regards to the vascular, neurological and musculoskeletal systems.  -Stressed the importance of good glycemic control  and the detriment of not  controlling glucose levels in relation to the foot. -Discussed supportive shoes at all times and checking feet regularly.  -Mechanically debrided all nails 1-5 bilateral using sterile nail nipper and filed with dremel without incident   -Answered all patient  questions -Patient to return  in 3 months for at risk foot care -Patient advised to call the office if any problems or questions arise in the meantime.   Louann Sjogren, DPM

## 2023-12-08 DIAGNOSIS — R54 Age-related physical debility: Secondary | ICD-10-CM | POA: Diagnosis not present

## 2023-12-08 DIAGNOSIS — E785 Hyperlipidemia, unspecified: Secondary | ICD-10-CM | POA: Diagnosis not present

## 2023-12-08 DIAGNOSIS — I130 Hypertensive heart and chronic kidney disease with heart failure and stage 1 through stage 4 chronic kidney disease, or unspecified chronic kidney disease: Secondary | ICD-10-CM | POA: Diagnosis not present

## 2023-12-08 DIAGNOSIS — J449 Chronic obstructive pulmonary disease, unspecified: Secondary | ICD-10-CM | POA: Diagnosis not present

## 2023-12-08 DIAGNOSIS — E1142 Type 2 diabetes mellitus with diabetic polyneuropathy: Secondary | ICD-10-CM | POA: Diagnosis not present

## 2023-12-08 DIAGNOSIS — N184 Chronic kidney disease, stage 4 (severe): Secondary | ICD-10-CM | POA: Diagnosis not present

## 2023-12-08 DIAGNOSIS — I25119 Atherosclerotic heart disease of native coronary artery with unspecified angina pectoris: Secondary | ICD-10-CM | POA: Diagnosis not present

## 2023-12-08 DIAGNOSIS — E1151 Type 2 diabetes mellitus with diabetic peripheral angiopathy without gangrene: Secondary | ICD-10-CM | POA: Diagnosis not present

## 2023-12-08 DIAGNOSIS — I5030 Unspecified diastolic (congestive) heart failure: Secondary | ICD-10-CM | POA: Diagnosis not present

## 2023-12-08 DIAGNOSIS — F331 Major depressive disorder, recurrent, moderate: Secondary | ICD-10-CM | POA: Diagnosis not present

## 2023-12-08 DIAGNOSIS — E1122 Type 2 diabetes mellitus with diabetic chronic kidney disease: Secondary | ICD-10-CM | POA: Diagnosis not present

## 2023-12-08 DIAGNOSIS — D649 Anemia, unspecified: Secondary | ICD-10-CM | POA: Diagnosis not present

## 2023-12-09 DIAGNOSIS — I495 Sick sinus syndrome: Secondary | ICD-10-CM | POA: Diagnosis not present

## 2023-12-09 DIAGNOSIS — I252 Old myocardial infarction: Secondary | ICD-10-CM | POA: Diagnosis not present

## 2023-12-09 DIAGNOSIS — I2510 Atherosclerotic heart disease of native coronary artery without angina pectoris: Secondary | ICD-10-CM | POA: Diagnosis not present

## 2023-12-09 DIAGNOSIS — J449 Chronic obstructive pulmonary disease, unspecified: Secondary | ICD-10-CM | POA: Diagnosis not present

## 2023-12-09 DIAGNOSIS — E785 Hyperlipidemia, unspecified: Secondary | ICD-10-CM | POA: Diagnosis not present

## 2023-12-09 DIAGNOSIS — I441 Atrioventricular block, second degree: Secondary | ICD-10-CM | POA: Diagnosis not present

## 2023-12-09 DIAGNOSIS — I130 Hypertensive heart and chronic kidney disease with heart failure and stage 1 through stage 4 chronic kidney disease, or unspecified chronic kidney disease: Secondary | ICD-10-CM | POA: Diagnosis not present

## 2023-12-09 DIAGNOSIS — D649 Anemia, unspecified: Secondary | ICD-10-CM | POA: Diagnosis not present

## 2023-12-09 DIAGNOSIS — I5030 Unspecified diastolic (congestive) heart failure: Secondary | ICD-10-CM | POA: Diagnosis not present

## 2023-12-09 DIAGNOSIS — I110 Hypertensive heart disease with heart failure: Secondary | ICD-10-CM | POA: Diagnosis not present

## 2023-12-09 DIAGNOSIS — E1142 Type 2 diabetes mellitus with diabetic polyneuropathy: Secondary | ICD-10-CM | POA: Diagnosis not present

## 2023-12-09 DIAGNOSIS — E1122 Type 2 diabetes mellitus with diabetic chronic kidney disease: Secondary | ICD-10-CM | POA: Diagnosis not present

## 2023-12-09 DIAGNOSIS — I4819 Other persistent atrial fibrillation: Secondary | ICD-10-CM | POA: Diagnosis not present

## 2023-12-09 DIAGNOSIS — B372 Candidiasis of skin and nail: Secondary | ICD-10-CM | POA: Diagnosis not present

## 2023-12-09 DIAGNOSIS — E669 Obesity, unspecified: Secondary | ICD-10-CM | POA: Diagnosis not present

## 2023-12-09 DIAGNOSIS — I5032 Chronic diastolic (congestive) heart failure: Secondary | ICD-10-CM | POA: Diagnosis not present

## 2023-12-09 DIAGNOSIS — N184 Chronic kidney disease, stage 4 (severe): Secondary | ICD-10-CM | POA: Diagnosis not present

## 2024-01-09 LAB — LAB REPORT - SCANNED
A1c: 6.7
EGFR: 24

## 2024-01-25 NOTE — Progress Notes (Unsigned)
No chief complaint on file.  History of Present Illness: 89 yo female with history of CAD, HTN, HLD, COPD, tobacco abuse, carotid artery disease, Mobitz 1 heart block, chronic diastolic CHF, atrial fibrillation and DM here today cardiac follow up. In July of 2010 she had an inferior MI treated with a drug eluting stent in the right coronary artery. She had residual moderate LAD stenosis and an ejection fraction of 60%. She has chronic shortness of breath and chronic obstructive pulmonary disease by pulmonary function testing. Stress myoview 12/20/13 with no ischemia, normal LV function. Mild bilateral carotid artery disease by dopplers in January 2020. She was hospitalized in September 2022 with COPD exacerbation  and acute on chronic diastolic CHF. Echo September 2022 with LVEF=60-65%.  She was found to be in atrial fibrillation during that admission and was started on Eliquis. She was diuresed with IV Lasix.  She was found to be bradycardic and toprol was stopped. She has since been followed in our Advanced Heart Failure office at [facility]. She had been on ASA and Plavix but both have been stopped. She has been on torsemide. Cardiac monitor October 2022 with 100% atrial fibrillation. She has been seen in the atrial fib clinic. She was admitted to [facility] 11/03/21 with a CHF and COPD exacerbation. She was cardioverted to sinus rhythm in February 2023 and immediately noted to have transient 2:1 AV block. She was seen in our office 03/13/22 by Dr. Lynnette Caffey with dyspnea and fatigue and reported heart rate in the 40s at home. EKG with Mobitz 1 AV block. She was referred to see Dr. Graciela Husbands in the EP clinic. He and I agreed that an ischemic evaluation should be pursued before permanent pacing. I saw her in May 2023 and she refused to consider a cardiac cath at that time. I saw her back in August 2023 and she refused cardiac cath again. She reported ongoing dyspnea and fatigue but no dizziness.  She stopped taking Entresto in  2023 and does not wish to restart this medication. Echo May 2024 with LVEF=65-70%. No valve disease. She was admitted to [facility] in October 2024 with increased dyspnea and was given IV Lasix. She was bradycardic and seen by EP but no indications for a pacemaker. She had black stools and had Hgb of 9.3 but was told she was high risk for GI evaluation and she refused a EGD/colonoscopy. Follow up Hgb was 12.3. She resumed Eliquis. She was seen in our office in November 2024 and had weight gain. Her torsemide was increased to 40 mg am and 20 mg pm for several days. Protonix was started.   She is here today for follow up. The patient denies any chest pain, *** dyspnea, palpitations, lower extremity edema, orthopnea, PND, dizziness, near syncope or syncope.    Primary Care Physician: [facility]  Past Medical History:  Diagnosis Date   ACUT DUOD ULCER W/HEMORR&PERF W/O MENTION OBST 10/05/2009   NSAID induced   ALLERGIC RHINITIS CAUSE UNSPECIFIED    ANEMIA-NOS    CAD (coronary artery disease) 06/08/2009   DEs RCA with 70% LAD and EF 60%   CHF (congestive heart failure) (HCC)    COPD    mild obst on PFTs 03/2010   Diabetes mellitus 06/2010 dx   Mild, diet controlled   GLAUCOMA    HYPERLIPIDEMIA    HYPERTENSION, BENIGN    MYOCARDIAL INFARCTION 06/08/2009   des to rca   Persistent atrial fibrillation (HCC)    Dx 08/2021  TOBACCO ABUSE     Past Surgical History:  Procedure Laterality Date   CARDIOVERSION N/A 01/30/2022   Procedure: CARDIOVERSION;  Surgeon: Nahser, Deloris Ping, MD;  Location: [facility] ENDOSCOPY;  Service: Cardiovascular;  Laterality: N/A;   HEMORRHOID SURGERY  1990   Right knee surgery      Current Outpatient Medications  Medication Sig Dispense Refill   acetaminophen (TYLENOL) 500 MG tablet Take 500 mg by mouth every 6 (six) hours as needed. Takes two tablets in the morning and two tablets at night     albuterol (VENTOLIN HFA) 108 (90 Base) MCG/ACT inhaler INHALE ONE  PUFF EVERY 6 HOURS AS NEEDED FOR SHORTNESS OF BREATH 18 g 2   apixaban (ELIQUIS) 2.5 MG TABS tablet Take 1 tablet (2.5 mg total) by mouth 2 (two) times daily. 180 tablet 3   dorzolamide (TRUSOPT) 2 % ophthalmic solution Place 1 drop into both eyes 2 (two) times daily.     Fe Fum-Vit C-Vit B12-FA (TRIGELS-F FORTE) CAPS capsule Take 1 capsule by mouth 2 (two) times daily. 180 capsule 0   FEROSUL 325 (65 Fe) MG tablet Take 325 mg by mouth 2 (two) times daily.     folic acid (FOLVITE) 1 MG tablet Take 1 mg by mouth daily.     gabapentin (NEURONTIN) 100 MG capsule Take 100-200 mg by mouth See admin instructions. Take 1 capsule (100mg ) by mouth every morning and take 2 capsules (200mg ) by mouth at bedtime     hydrOXYzine (ATARAX) 25 MG tablet Take 25 mg by mouth every 8 (eight) hours as needed for itching.     ipratropium-albuterol (DUONEB) 0.5-2.5 (3) MG/3ML SOLN Take 3 mLs by nebulization every 6 (six) hours as needed. 360 mL 1   latanoprost (XALATAN) 0.005 % ophthalmic solution Place 1 drop into both eyes in the morning.     NITROSTAT 0.4 MG SL tablet DISSOLVE ONE TABLET UNDER TONGUE AS NEEDED FOR CHEST PAIN - MAY REPEAT TWICE-IF NO RELIEF GO TO NEAREST HOSPITAL ER 25 tablet 0   nystatin (MYCOSTATIN/NYSTOP) powder Apply 1 application topically daily.     pantoprazole (PROTONIX) 40 MG tablet Take 1 tablet (40 mg total) by mouth daily. 90 tablet 3   rosuvastatin (CRESTOR) 20 MG tablet Take 20 mg by mouth daily.     torsemide (DEMADEX) 20 MG tablet Take 2 tablets by mouth in the a.m and 1 tablet by mouth in the p.m for 2 days then go back to 1 tablet by mouth twice a day     umeclidinium-vilanterol (ANORO ELLIPTA) 62.5-25 MCG/ACT AEPB Inhale 1 puff into the lungs daily.     No current facility-administered medications for this visit.    Allergies  Allergen Reactions   Entresto [Sacubitril-Valsartan] Shortness Of Breath    COPD and reports increased SOB with Entresto   Aspirin Other (See Comments)     Can take 81 mg not 325 mg   Atorvastatin Itching    Itching and myaliga  Other Reaction(s): itching, rash, Other (See Comments)   Cyclobenzaprine     Other reaction(s): muscle relaxers-ulcer hemorrhage (hospitalized)  Other Reaction(s): Other (See Comments), ulcer hemorrhage   Lactose Intolerance (Gi) Other (See Comments)    GI upset   Meperidine Hcl Other (See Comments)    Not known   Pravastatin Rash    rash  Other Reaction(s): Other (See Comments)   Propoxyphene     Other reaction(s): pain meds, hallucination, vomiting  Other Reaction(s): Hallucination, hallucinations, vomiting, Other (See Comments), Unknown  Wound Dressing Adhesive     Other reaction(s): red, stings    Social History   Socioeconomic History   Marital status: Widowed    Spouse name: Not on file   Number of children: Not on file   Years of education: Not on file   Highest education level: Not on file  Occupational History   Not on file  Tobacco Use   Smoking status: Former    Current packs/day: 0.00    Average packs/day: 0.5 packs/day for 60.0 years (30.0 ttl pk-yrs)    Types: Cigarettes    Start date: 11/12/1961    Quit date: 11/12/2021    Years since quitting: 2.2   Smokeless tobacco: Never   Tobacco comments:    She lives in [place] with her significant other (Charles Hook)0  Vaping Use   Vaping status: Never Used  Substance and Sexual Activity   Alcohol use: Yes    Alcohol/week: 1.0 - 2.0 standard drink of alcohol    Types: 1 - 2 Glasses of wine per week    Comment: once or twice a year glass of wine 12/25/21   Drug use: No   Sexual activity: Not on file  Other Topics Concern   Not on file  Social History Narrative   Lives in [place] with her SO - charles hook   Retired -Acupuncturist   Enjoys travel - live Engineer, maintenance (IT)   Social Drivers of Corporate investment banker Strain: Not on file  Food Insecurity: No Food Insecurity (09/26/2023)   Hunger Vital Sign     Worried About Running Out of Food in the Last Year: Never true    Ran Out of Food in the Last Year: Never true  Transportation Needs: No Transportation Needs (09/26/2023)   PRAPARE - Administrator, Civil Service (Medical): No    Lack of Transportation (Non-Medical): No  Physical Activity: Not on file  Stress: Not on file  Social Connections: Not on file  Intimate Partner Violence: Not At Risk (09/26/2023)   Humiliation, Afraid, Rape, and Kick questionnaire    Fear of Current or Ex-Partner: No    Emotionally Abused: No    Physically Abused: No    Sexually Abused: No    Family History  Problem Relation Age of Onset   Hypertension Mother    Ulcers Mother    Stomach cancer Father    Stroke Maternal Grandmother    Heart attack Neg Hx     Review of Systems:  As stated in the HPI and otherwise negative.   There were no vitals taken for this visit.  Physical Examination:  General: Well developed, well nourished, NAD  HEENT: OP clear, mucus membranes moist  SKIN: warm, dry. No rashes. Neuro: No focal deficits  Musculoskeletal: Muscle strength 5/5 all ext  Psychiatric: Mood and affect normal  Neck: No JVD, no carotid bruits, no thyromegaly, no lymphadenopathy.  Lungs:Clear bilaterally, no wheezes, rhonci, crackles Cardiovascular: Regular rate and rhythm. No murmurs, gallops or rubs. Abdomen:Soft. Bowel sounds present. Non-tender.  Extremities: No lower extremity edema. Pulses are 2 + in the bilateral DP/PT.  EKG:  EKG is *** ordered today The ekg ordered today demonstrates    Recent Labs: 09/29/2023: B Natriuretic Peptide 203.9 10/09/2023: ALT 14; BUN 43; Creatinine, Ser 1.41; Hemoglobin 12.3; Platelets 285; Potassium 3.6; Sodium 140    Wt Readings from Last 3 Encounters:  10/14/23 88.3 kg  10/09/23 83.5 kg  09/26/23 83.5 kg  Assessment and Plan:   1. CAD without angina: She has been known to have a moderate LAD stenosis since her cath in 2010 and has  continued smoking since then. No chest pain. She has refused to consider a cardiac cath. Her anti-platelet therapy was stopped when she was started on Eliquis. She has not been on a beta blocker due to bradycardia. Continue statin  2. HYPERTENSION: BP is normal. She is not on medical therapy  3. HYPERLIPIDEMIA: Lipids followed in primary care. LDL ***. Continue statin.   4. Carotid artery disease: Mild bilateral carotid disease by dopplers January 2020.  Will not repeat given advanced age.   5. Tobacco abuse: She quit smoking in 2023.   6. Atrial fibrillation/History of Second degree AV block, type 1/First degree AV block: HR today is ***. She does not have dizziness. She has been seen by our EP team and not felt to have an indication for pacemaker placement. Continue Eliquis.   7. Chronic diastolic CHF: Weight is stable. No volume overload on exam. Continue torsemide  Labs/ tests ordered today include:   No orders of the defined types were placed in this encounter.  Disposition:   F/U with me in 12  months  Signed, Verne Carrow, MD 01/25/2024 5:13 PM    [facility] [address] [place], [place], [place]  [address] Phone: [phone]; Fax: [phone]

## 2024-01-26 ENCOUNTER — Encounter: Payer: Self-pay | Admitting: Cardiovascular Disease

## 2024-01-26 ENCOUNTER — Ambulatory Visit: Payer: Medicare Other | Attending: Cardiovascular Disease | Admitting: Cardiovascular Disease

## 2024-01-26 VITALS — BP 162/70 | HR 45 | Ht 62.0 in | Wt 201.2 lb

## 2024-01-26 DIAGNOSIS — I6523 Occlusion and stenosis of bilateral carotid arteries: Secondary | ICD-10-CM | POA: Diagnosis present

## 2024-01-26 DIAGNOSIS — E7800 Pure hypercholesterolemia, unspecified: Secondary | ICD-10-CM | POA: Insufficient documentation

## 2024-01-26 DIAGNOSIS — I2510 Atherosclerotic heart disease of native coronary artery without angina pectoris: Secondary | ICD-10-CM | POA: Insufficient documentation

## 2024-01-26 DIAGNOSIS — I5032 Chronic diastolic (congestive) heart failure: Secondary | ICD-10-CM | POA: Diagnosis not present

## 2024-01-26 DIAGNOSIS — I10 Essential (primary) hypertension: Secondary | ICD-10-CM | POA: Diagnosis not present

## 2024-01-26 DIAGNOSIS — N1831 Chronic kidney disease, stage 3a: Secondary | ICD-10-CM | POA: Insufficient documentation

## 2024-01-26 DIAGNOSIS — I480 Paroxysmal atrial fibrillation: Secondary | ICD-10-CM | POA: Diagnosis present

## 2024-01-26 NOTE — Patient Instructions (Signed)

## 2024-02-03 ENCOUNTER — Telehealth: Payer: Self-pay | Admitting: Cardiovascular Disease

## 2024-02-03 NOTE — Telephone Encounter (Signed)
 Spoke with Amy. Mr Muchmore is stating she has gained weight over the course of the past week. Amy states pt is DOE. Pt's weight was 201 lbs today BP - 162/70 and pulse was in the 40's. (She was 201 lbs at OV last week) Amy called PCP and got orders for a CBC, BMET and Pro BNP. Amy states pt is not a reliable historian and weights pt has given her may be off but this morning Amy weighted her. Amy states that she believes pt is possibly dehydrated and has been eating spaghetti for the past 2 days. She was dropping off the labs at [facility] while having this conversation.  Told Amy I would forward this to Dr Clifton James and the [facility] as she has a appointment with them next week.  Amy has requested that we call her back with any recommendations and she will report them to the patient.

## 2024-02-03 NOTE — Telephone Encounter (Signed)
 Pt c/o Shortness Of Breath: STAT if SOB developed within the last 24 hours or pt is noticeably SOB on the phone  1. Are you currently SOB (can you hear that pt is SOB on the phone)? Yes per home health nurse  2. How long have you been experiencing SOB? Since last week  3. Are you SOB when sitting or when up moving around? Moving around  4. Are you currently experiencing any other symptoms? Weight gain, crackle in left lower lobe

## 2024-02-04 LAB — LAB REPORT - SCANNED: EGFR: 30

## 2024-02-05 ENCOUNTER — Telehealth: Payer: Self-pay | Admitting: Family

## 2024-02-05 MED ORDER — METOLAZONE 2.5 MG PO TABS: 2.5000 mg | ORAL_TABLET | ORAL | 0 refills | Status: DC

## 2024-02-05 NOTE — Telephone Encounter (Signed)
 Finally received BMET/ CBC results from [facility]. BNP was not resulted so home health nurse contacted [facility] for them to run it. Patient has been more SOB with crackles in left lower lobe. Not drinking any water and has been eating high sodium foods, like spaghetti. Renal function improved from last month.   Currently taking 40mg  torsemide daily and didn't feel like increased dose last time worked for her. Will give metolazone 2.5mg  every other day X 2 doses. Emphasized drinking water and decreasing sodium intake.   Has f/u in HF clinic next week and labs can be drawn at that time. Home health nurse aware of plan and will call patient to discuss.

## 2024-02-06 ENCOUNTER — Telehealth: Payer: Self-pay | Admitting: Family

## 2024-02-06 NOTE — Telephone Encounter (Signed)
 RX sent to [facility]

## 2024-02-10 ENCOUNTER — Telehealth: Payer: Self-pay | Admitting: Family

## 2024-02-10 LAB — LAB REPORT - SCANNED: EGFR: 21

## 2024-02-10 NOTE — Progress Notes (Unsigned)
 [facility] Note    PCP: seeing [facility] every month Primary Cardiologist: Melene Muller, MD (last seen 02/25)  Chief Complaint: shortness of breath  HPI:  Ms Braaten is a 89 y/o female with a history of CAD, hyperlipidemia, HTN, pulmonary HTN, anemia, COPD, glaucoma, CKD, atrial fibrillation, tachy-brady syndrome, tobacco use and chronic heart failure.   Admitted 09/26/23 with worsening shortness of breath. Has also noticed black colored stool. Hemoglobin of 9.3 with baseline above 13 but it was more than a year ago, renal function stable and at baseline. BNP elevated at 441. Chest x-ray with concern of pulmonary vascular congestion. IV lasix given, cardiology consulted. Started on iron supplement. EP consulted for possible pacemaker. IV iron given.   Echo 08/22/21: EF of 60-65% along with mild LVH/ LAE, mild MR and severely elevated PA pressure of 65.1 mmHg.  Echo 04/09/23: EF 65-70% along with Grade II DD, severely elevated PA pressure, mild LAE, moderate RAE and mild/ moderate TR.    Stress test 12/2013  She presents today, with her son, for a HF follow-up visit with a chief complaint of minimal shortness of breath. Chronic in nature and improved since took 2 doses of metolazone. Has associated neck pain along with this. Denies fatigue, chest pain, palpitations, abdominal distention, pedal edema, weight gain or difficulty sleeping. Son says that patient takes 1500-2000mg  plain tylenol daily for her neck pain.   HR has been running 40's-60's she says since her eliquis was decreased and daily inhaler was started.   ROS: All systems negative except as listed in HPI, PMH and Problem List.  SH:  Social History   Socioeconomic History   Marital status: Widowed    Spouse name: Not on file   Number of children: Not on file   Years of education: Not on file   Highest education level: Not on file  Occupational History   Not on file  Tobacco Use   Smoking  status: Former    Current packs/day: 0.00    Average packs/day: 0.5 packs/day for 60.0 years (30.0 ttl pk-yrs)    Types: Cigarettes    Start date: 11/12/1961    Quit date: 11/12/2021    Years since quitting: 2.2   Smokeless tobacco: Never   Tobacco comments:    She lives in [place] with her significant other (Charles Hook)0  Vaping Use   Vaping status: Never Used  Substance and Sexual Activity   Alcohol use: Yes    Alcohol/week: 1.0 - 2.0 standard drink of alcohol    Types: 1 - 2 Glasses of wine per week    Comment: once or twice a year glass of wine 12/25/21   Drug use: No   Sexual activity: Not on file  Other Topics Concern   Not on file  Social History Narrative   Lives in [place] with her SO - charles hook   Retired -Acupuncturist   Enjoys travel - live Engineer, maintenance (IT)   Social Drivers of Corporate investment banker Strain: Not on file  Food Insecurity: No Food Insecurity (09/26/2023)   Hunger Vital Sign    Worried About Running Out of Food in the Last Year: Never true    Ran Out of Food in the Last Year: Never true  Transportation Needs: No Transportation Needs (09/26/2023)   PRAPARE - Administrator, Civil Service (Medical): No    Lack of Transportation (Non-Medical): No  Physical Activity: Not on file  Stress: Not on  file  Social Connections: Not on file  Intimate Partner Violence: Not At Risk (09/26/2023)   Humiliation, Afraid, Rape, and Kick questionnaire    Fear of Current or Ex-Partner: No    Emotionally Abused: No    Physically Abused: No    Sexually Abused: No    FH:  Family History  Problem Relation Age of Onset   Hypertension Mother    Ulcers Mother    Stomach cancer Father    Stroke Maternal Grandmother    Heart attack Neg Hx     Past Medical History:  Diagnosis Date   ACUT DUOD ULCER W/HEMORR&PERF W/O MENTION OBST 10/05/2009   NSAID induced   ALLERGIC RHINITIS CAUSE UNSPECIFIED    ANEMIA-NOS    CAD (coronary artery  disease) 06/08/2009   DEs RCA with 70% LAD and EF 60%   CHF (congestive heart failure) (HCC)    COPD    mild obst on PFTs 03/2010   Diabetes mellitus 06/2010 dx   Mild, diet controlled   GLAUCOMA    HYPERLIPIDEMIA    HYPERTENSION, BENIGN    MYOCARDIAL INFARCTION 06/08/2009   des to rca   Persistent atrial fibrillation (HCC)    Dx 08/2021   TOBACCO ABUSE     Current Outpatient Medications  Medication Sig Dispense Refill   acetaminophen (TYLENOL) 500 MG tablet Take 500 mg by mouth every 6 (six) hours as needed. Takes two tablets in the morning and two tablets at night     albuterol (VENTOLIN HFA) 108 (90 Base) MCG/ACT inhaler INHALE ONE PUFF EVERY 6 HOURS AS NEEDED FOR SHORTNESS OF BREATH 18 g 2   apixaban (ELIQUIS) 2.5 MG TABS tablet Take 1 tablet (2.5 mg total) by mouth 2 (two) times daily. 180 tablet 3   dorzolamide (TRUSOPT) 2 % ophthalmic solution Place 1 drop into both eyes 2 (two) times daily.     FEROSUL 325 (65 Fe) MG tablet Take 325 mg by mouth 2 (two) times daily. (Patient not taking: Reported on 01/26/2024)     folic acid (FOLVITE) 1 MG tablet Take 1 mg by mouth daily. (Patient not taking: Reported on 01/26/2024)     gabapentin (NEURONTIN) 100 MG capsule Take 100-200 mg by mouth See admin instructions. Take 1 capsule (100mg ) by mouth every morning and take 2 capsules (200mg ) by mouth at bedtime     hydrOXYzine (ATARAX) 25 MG tablet Take 25 mg by mouth every 8 (eight) hours as needed for itching.     ipratropium-albuterol (DUONEB) 0.5-2.5 (3) MG/3ML SOLN Take 3 mLs by nebulization every 6 (six) hours as needed. 360 mL 1   latanoprost (XALATAN) 0.005 % ophthalmic solution Place 1 drop into both eyes in the morning.     metolazone (ZAROXOLYN) 2.5 MG tablet Take 1 tablet (2.5 mg total) by mouth every other day. For 2 doses only. Take 1/2 hour before torsemide 2 tablet 0   NITROSTAT 0.4 MG SL tablet DISSOLVE ONE TABLET UNDER TONGUE AS NEEDED FOR CHEST PAIN - MAY REPEAT TWICE-IF NO  RELIEF GO TO NEAREST HOSPITAL ER 25 tablet 0   nystatin (MYCOSTATIN/NYSTOP) powder Apply 1 application topically daily.     pantoprazole (PROTONIX) 40 MG tablet Take 1 tablet (40 mg total) by mouth daily. 90 tablet 3   rosuvastatin (CRESTOR) 20 MG tablet Take 20 mg by mouth daily.     Tiotropium Bromide-Olodaterol (STIOLTO RESPIMAT) 2.5-2.5 MCG/ACT AERS 2 puffs Inhalation Once a day for 30 days     torsemide (DEMADEX) 20  MG tablet Take 2 tablets by mouth in the a.m and 1 tablet by mouth in the p.m for 2 days then go back to 1 tablet by mouth twice a day     No current facility-administered medications for this visit.   Vitals:   02/11/24 1101 02/11/24 1102  BP: 120/68   Pulse: 64 62  SpO2: 92% 96%  Weight: 195 lb (88.5 kg)    Wt Readings from Last 3 Encounters:  02/11/24 195 lb (88.5 kg)  01/26/24 201 lb 3.2 oz (91.3 kg)  10/14/23 194 lb 9.6 oz (88.3 kg)   Lab Results  Component Value Date   CREATININE 1.41 (H) 10/09/2023   CREATININE 1.40 (H) 09/30/2023   CREATININE 1.57 (H) 09/29/2023    PHYSICAL EXAM:  General: Well appearing. No resp difficulty HEENT: normal Neck: supple, no JVD Cor: Regular rhythm, rate. No rubs, gallops or murmurs Lungs: clear Abdomen: soft, nontender, nondistended. Extremities: no cyanosis, clubbing, rash, edema Neuro: alert & oriented X 3. Moves all 4 extremities w/o difficulty. Affect pleasant   ECG: not done   ASSESSMENT & PLAN:  1: NICM with preserved ejection fraction- - likely due to AF - NYHA class II - euvolemic today - weighing daily; reminded to call for an overnight weight gain of > 2 pounds or a weekly weight gain of > 5 pounds - weight up 5 pounds from last visit here 6 months ago - Echo 08/22/21: EF of 60-65% along with mild LVH/ LAE, mild MR and severely elevated PA pressure of 65.1 mmHg. - Echo 04/09/23: EF 65-70% along with Grade II DD, severely elevated PA pressure, mild LAE, moderate RAE and mild/ moderate TR.  - not adding  salt to her food and tries to eat low sodium foods  - continue torsemide 20mg  BID with additional 20mg  PRN - RX for metolazone 2.5mg  PRN; emphasized that she NOT take this until she calls the office - have messaged home health nurse to get BMET next week when she goes to see her - SOB worsened when taking entresto - not interested in spironolactone - continues to have issues with yeast in groin/ under breasts so not a good candidate for SGLT2 - saw cardiology Sanjuana Kava) 02/25 - says that she generally cooks from scratch but does use salt; regular, kosher, sea salt, garlic salt etc; reviewed the importance of not using anything that has the word "salt" in it - proBNP 02/10/24 reviewed and was 1004  2: HTN- - BP 120/68 - seeing [facility] for primary care every month - home health nurse comes every few weeks  - BMP 02/10/24 reviewed and showed sodium 139, potassium 4.2, creatinine 2.2 and GFR 21 - saw nephrology Suezanne Jacquet) 03/23  3: Persistent atrial fibrillation- - saw EP Graciela Husbands) 04/23 - continue apixaban 2.5mg  BID - had cardioversion 01/31/22 & post cardioversion, developed Mobitz 2nd degree AV block  4: COPD- - stopped smoking fall 2022 - using trelegy daily and nebulizer PRN - saw pulmonology Karna Christmas) 03/24 - continue stiolto respimat daily; she says PCP is looking into any programs that may be able to assist with with paying for this medication - PFTs: 03/03/23 - FEV1 - 58%    Return in 4 months, sooner if needed  Delma Freeze, FNP 02/10/24

## 2024-02-10 NOTE — Telephone Encounter (Signed)
 Pt confirmed appt for 02/11/24

## 2024-02-11 ENCOUNTER — Ambulatory Visit: Payer: Medicare Other | Attending: Family | Admitting: Family

## 2024-02-11 ENCOUNTER — Encounter: Payer: Self-pay | Admitting: Family

## 2024-02-11 VITALS — BP 120/68 | HR 62 | Wt 195.0 lb

## 2024-02-11 DIAGNOSIS — I428 Other cardiomyopathies: Secondary | ICD-10-CM | POA: Insufficient documentation

## 2024-02-11 DIAGNOSIS — I5032 Chronic diastolic (congestive) heart failure: Secondary | ICD-10-CM

## 2024-02-11 DIAGNOSIS — J449 Chronic obstructive pulmonary disease, unspecified: Secondary | ICD-10-CM | POA: Diagnosis not present

## 2024-02-11 DIAGNOSIS — I2510 Atherosclerotic heart disease of native coronary artery without angina pectoris: Secondary | ICD-10-CM | POA: Diagnosis not present

## 2024-02-11 DIAGNOSIS — I130 Hypertensive heart and chronic kidney disease with heart failure and stage 1 through stage 4 chronic kidney disease, or unspecified chronic kidney disease: Secondary | ICD-10-CM | POA: Diagnosis not present

## 2024-02-11 DIAGNOSIS — N189 Chronic kidney disease, unspecified: Secondary | ICD-10-CM | POA: Insufficient documentation

## 2024-02-11 DIAGNOSIS — R0602 Shortness of breath: Secondary | ICD-10-CM | POA: Insufficient documentation

## 2024-02-11 DIAGNOSIS — K921 Melena: Secondary | ICD-10-CM | POA: Diagnosis not present

## 2024-02-11 DIAGNOSIS — M542 Cervicalgia: Secondary | ICD-10-CM | POA: Diagnosis not present

## 2024-02-11 DIAGNOSIS — Z79899 Other long term (current) drug therapy: Secondary | ICD-10-CM | POA: Insufficient documentation

## 2024-02-11 DIAGNOSIS — I10 Essential (primary) hypertension: Secondary | ICD-10-CM | POA: Diagnosis not present

## 2024-02-11 DIAGNOSIS — Z87891 Personal history of nicotine dependence: Secondary | ICD-10-CM | POA: Diagnosis not present

## 2024-02-11 DIAGNOSIS — B372 Candidiasis of skin and nail: Secondary | ICD-10-CM | POA: Diagnosis not present

## 2024-02-11 DIAGNOSIS — E1122 Type 2 diabetes mellitus with diabetic chronic kidney disease: Secondary | ICD-10-CM | POA: Insufficient documentation

## 2024-02-11 DIAGNOSIS — I272 Pulmonary hypertension, unspecified: Secondary | ICD-10-CM | POA: Diagnosis not present

## 2024-02-11 DIAGNOSIS — I509 Heart failure, unspecified: Secondary | ICD-10-CM | POA: Insufficient documentation

## 2024-02-11 DIAGNOSIS — I4819 Other persistent atrial fibrillation: Secondary | ICD-10-CM | POA: Insufficient documentation

## 2024-02-11 DIAGNOSIS — E785 Hyperlipidemia, unspecified: Secondary | ICD-10-CM | POA: Diagnosis not present

## 2024-02-11 DIAGNOSIS — Z7901 Long term (current) use of anticoagulants: Secondary | ICD-10-CM | POA: Insufficient documentation

## 2024-02-11 NOTE — Patient Instructions (Signed)
 Medication Changes:  Metolazone 2.5 mg AS NEEDED. Please give [place] a call first if you feel like you need to take this.   Follow-Up in: 4 months with Clarisa Kindred, FNP  At the [facility], you and your health needs are our priority. We have a designated team specialized in the treatment of Heart Failure. This Care Team includes your primary Heart Failure Specialized Cardiologist (physician), Advanced Practice Providers (APPs- Physician Assistants and Nurse Practitioners), and Pharmacist who all work together to provide you with the care you need, when you need it.   You may see any of the following providers on your designated Care Team at your next follow up:  Dr. Arvilla Meres Dr. Marca Ancona Dr. Dorthula Nettles Dr. Theresia Bough Clarisa Kindred, FNP Enos Fling, RPH-CPP  Please be sure to bring in all your medications bottles to every appointment.   Need to Contact us:  If you have any questions or concerns before your next appointment please send [place] a message through [place] or call our office at [phone].    TO LEAVE A MESSAGE FOR THE NURSE SELECT OPTION 2, PLEASE LEAVE A MESSAGE INCLUDING: YOUR NAME DATE OF BIRTH CALL BACK NUMBER REASON FOR CALL**this is important as we prioritize the call backs  YOU WILL RECEIVE A CALL BACK THE SAME DAY AS LONG AS YOU CALL BEFORE 4:00 PM

## 2024-02-19 ENCOUNTER — Other Ambulatory Visit: Payer: Self-pay | Admitting: Family

## 2024-02-19 MED ORDER — METOLAZONE 2.5 MG PO TABS: 2.5000 mg | ORAL_TABLET | ORAL | 0 refills | Status: DC | PRN

## 2024-02-20 LAB — LAB REPORT - SCANNED: EGFR: 25

## 2024-02-25 ENCOUNTER — Telehealth: Payer: Self-pay | Admitting: Podiatry

## 2024-02-25 ENCOUNTER — Ambulatory Visit: Payer: Medicare Other | Admitting: Podiatry

## 2024-02-25 NOTE — Telephone Encounter (Signed)
 Pt called and left message today at 641am stating she needed to cxl the appt for today at 1100 due to she is having issues with her AFIB.she was very out of breath and stated she could not even walk to bathroom   I cxled appt and did speak to pt and she was still very short of breath and stated her heart rate was only 38. I did ask if she has contacted her cardiologist and she said her nurse is coming to the house today.

## 2024-03-08 LAB — LAB REPORT - SCANNED: EGFR: 20

## 2024-03-23 LAB — LAB REPORT - SCANNED: EGFR: 26

## 2024-04-19 ENCOUNTER — Encounter: Payer: Self-pay | Admitting: Family

## 2024-05-15 LAB — LAB REPORT - SCANNED: EGFR: 22

## 2024-06-16 ENCOUNTER — Emergency Department (HOSPITAL_COMMUNITY)

## 2024-06-16 ENCOUNTER — Encounter: Admitting: Family

## 2024-06-16 ENCOUNTER — Other Ambulatory Visit: Payer: Self-pay

## 2024-06-16 ENCOUNTER — Inpatient Hospital Stay (HOSPITAL_COMMUNITY)
Admission: EM | Admit: 2024-06-16 | Discharge: 2024-06-24 | DRG: 190 | Disposition: A | Discharge status: Home Health | Attending: Internal Medicine | Admitting: Internal Medicine

## 2024-06-16 DIAGNOSIS — E876 Hypokalemia: Secondary | ICD-10-CM | POA: Diagnosis present

## 2024-06-16 DIAGNOSIS — E119 Type 2 diabetes mellitus without complications: Secondary | ICD-10-CM

## 2024-06-16 DIAGNOSIS — I10 Essential (primary) hypertension: Secondary | ICD-10-CM | POA: Diagnosis present

## 2024-06-16 DIAGNOSIS — I4821 Permanent atrial fibrillation: Secondary | ICD-10-CM | POA: Diagnosis present

## 2024-06-16 DIAGNOSIS — I130 Hypertensive heart and chronic kidney disease with heart failure and stage 1 through stage 4 chronic kidney disease, or unspecified chronic kidney disease: Secondary | ICD-10-CM | POA: Diagnosis present

## 2024-06-16 DIAGNOSIS — J441 Chronic obstructive pulmonary disease with (acute) exacerbation: Secondary | ICD-10-CM | POA: Diagnosis not present

## 2024-06-16 DIAGNOSIS — J449 Chronic obstructive pulmonary disease, unspecified: Secondary | ICD-10-CM | POA: Diagnosis present

## 2024-06-16 DIAGNOSIS — M25551 Pain in right hip: Secondary | ICD-10-CM | POA: Diagnosis present

## 2024-06-16 DIAGNOSIS — E66812 Obesity, class 2: Secondary | ICD-10-CM | POA: Diagnosis present

## 2024-06-16 DIAGNOSIS — D649 Anemia, unspecified: Secondary | ICD-10-CM | POA: Diagnosis present

## 2024-06-16 DIAGNOSIS — Z7901 Long term (current) use of anticoagulants: Secondary | ICD-10-CM

## 2024-06-16 DIAGNOSIS — Z6835 Body mass index (BMI) 35.0-35.9, adult: Secondary | ICD-10-CM

## 2024-06-16 DIAGNOSIS — E669 Obesity, unspecified: Secondary | ICD-10-CM | POA: Diagnosis present

## 2024-06-16 DIAGNOSIS — E861 Hypovolemia: Secondary | ICD-10-CM | POA: Diagnosis present

## 2024-06-16 DIAGNOSIS — I2721 Secondary pulmonary arterial hypertension: Secondary | ICD-10-CM | POA: Diagnosis present

## 2024-06-16 DIAGNOSIS — Z87891 Personal history of nicotine dependence: Secondary | ICD-10-CM

## 2024-06-16 DIAGNOSIS — Z1152 Encounter for screening for COVID-19: Secondary | ICD-10-CM

## 2024-06-16 DIAGNOSIS — R0602 Shortness of breath: Secondary | ICD-10-CM | POA: Diagnosis not present

## 2024-06-16 DIAGNOSIS — I509 Heart failure, unspecified: Secondary | ICD-10-CM

## 2024-06-16 DIAGNOSIS — I441 Atrioventricular block, second degree: Secondary | ICD-10-CM | POA: Diagnosis present

## 2024-06-16 DIAGNOSIS — N179 Acute kidney failure, unspecified: Secondary | ICD-10-CM | POA: Diagnosis present

## 2024-06-16 DIAGNOSIS — Z79899 Other long term (current) drug therapy: Secondary | ICD-10-CM

## 2024-06-16 DIAGNOSIS — I2510 Atherosclerotic heart disease of native coronary artery without angina pectoris: Secondary | ICD-10-CM | POA: Diagnosis present

## 2024-06-16 DIAGNOSIS — B372 Candidiasis of skin and nail: Secondary | ICD-10-CM | POA: Diagnosis present

## 2024-06-16 DIAGNOSIS — Z66 Do not resuscitate: Secondary | ICD-10-CM | POA: Diagnosis present

## 2024-06-16 DIAGNOSIS — I252 Old myocardial infarction: Secondary | ICD-10-CM

## 2024-06-16 DIAGNOSIS — I4819 Other persistent atrial fibrillation: Secondary | ICD-10-CM | POA: Diagnosis present

## 2024-06-16 DIAGNOSIS — I495 Sick sinus syndrome: Secondary | ICD-10-CM | POA: Diagnosis present

## 2024-06-16 DIAGNOSIS — N1832 Chronic kidney disease, stage 3b: Secondary | ICD-10-CM | POA: Diagnosis present

## 2024-06-16 DIAGNOSIS — I5033 Acute on chronic diastolic (congestive) heart failure: Secondary | ICD-10-CM | POA: Diagnosis present

## 2024-06-16 DIAGNOSIS — Z8249 Family history of ischemic heart disease and other diseases of the circulatory system: Secondary | ICD-10-CM

## 2024-06-16 DIAGNOSIS — E1122 Type 2 diabetes mellitus with diabetic chronic kidney disease: Secondary | ICD-10-CM | POA: Diagnosis present

## 2024-06-16 DIAGNOSIS — Z886 Allergy status to analgesic agent status: Secondary | ICD-10-CM

## 2024-06-16 DIAGNOSIS — E1151 Type 2 diabetes mellitus with diabetic peripheral angiopathy without gangrene: Secondary | ICD-10-CM | POA: Diagnosis present

## 2024-06-16 DIAGNOSIS — I2729 Other secondary pulmonary hypertension: Secondary | ICD-10-CM | POA: Diagnosis present

## 2024-06-16 DIAGNOSIS — I480 Paroxysmal atrial fibrillation: Secondary | ICD-10-CM | POA: Diagnosis present

## 2024-06-16 DIAGNOSIS — E785 Hyperlipidemia, unspecified: Secondary | ICD-10-CM | POA: Diagnosis present

## 2024-06-16 LAB — CBC WITH DIFFERENTIAL/PLATELET
Abs Immature Granulocytes: 0.05 K/uL (ref 0.00–0.07)
Basophils Absolute: 0 K/uL (ref 0.0–0.1)
Basophils Relative: 1 %
Eosinophils Absolute: 0.1 K/uL (ref 0.0–0.5)
Eosinophils Relative: 1 %
HCT: 35.3 % — ABNORMAL LOW (ref 36.0–46.0)
Hemoglobin: 11.6 g/dL — ABNORMAL LOW (ref 12.0–15.0)
Immature Granulocytes: 1 %
Lymphocytes Relative: 6 %
Lymphs Abs: 0.5 K/uL — ABNORMAL LOW (ref 0.7–4.0)
MCH: 30.3 pg (ref 26.0–34.0)
MCHC: 32.9 g/dL (ref 30.0–36.0)
MCV: 92.2 fL (ref 80.0–100.0)
Monocytes Absolute: 1 K/uL (ref 0.1–1.0)
Monocytes Relative: 11 %
Neutro Abs: 7.1 K/uL (ref 1.7–7.7)
Neutrophils Relative %: 80 %
Platelets: 300 K/uL (ref 150–400)
RBC: 3.83 MIL/uL — ABNORMAL LOW (ref 3.87–5.11)
RDW: 15.3 % (ref 11.5–15.5)
WBC: 8.7 K/uL (ref 4.0–10.5)
nRBC: 0 % (ref 0.0–0.2)

## 2024-06-16 LAB — COMPREHENSIVE METABOLIC PANEL WITH GFR
ALT: 10 U/L (ref 0–44)
AST: 17 U/L (ref 15–41)
Albumin: 2.8 g/dL — ABNORMAL LOW (ref 3.5–5.0)
Alkaline Phosphatase: 71 U/L (ref 38–126)
Anion gap: 14 (ref 5–15)
BUN: 54 mg/dL — ABNORMAL HIGH (ref 8–23)
CO2: 27 mmol/L (ref 22–32)
Calcium: 7 mg/dL — ABNORMAL LOW (ref 8.9–10.3)
Chloride: 97 mmol/L — ABNORMAL LOW (ref 98–111)
Creatinine, Ser: 1.9 mg/dL — ABNORMAL HIGH (ref 0.44–1.00)
GFR, Estimated: 25 mL/min — ABNORMAL LOW (ref >60)
Glucose, Bld: 100 mg/dL — ABNORMAL HIGH (ref 70–99)
Potassium: 2.8 mmol/L — ABNORMAL LOW (ref 3.5–5.1)
Sodium: 138 mmol/L (ref 135–145)
Total Bilirubin: 0.7 mg/dL (ref 0.0–1.2)
Total Protein: 6.2 g/dL — ABNORMAL LOW (ref 6.5–8.1)

## 2024-06-16 LAB — PHOSPHORUS: Phosphorus: 4 mg/dL (ref 2.5–4.6)

## 2024-06-16 LAB — RESP PANEL BY RT-PCR (RSV, FLU A&B, COVID)  RVPGX2
Influenza A by PCR: NEGATIVE
Influenza B by PCR: NEGATIVE
Resp Syncytial Virus by PCR: NEGATIVE
SARS Coronavirus 2 by RT PCR: NEGATIVE

## 2024-06-16 LAB — TROPONIN I (HIGH SENSITIVITY)
Troponin I (High Sensitivity): 28 ng/L — ABNORMAL HIGH (ref <18)
Troponin I (High Sensitivity): 27 ng/L — ABNORMAL HIGH (ref <18)

## 2024-06-16 LAB — MAGNESIUM: Magnesium: 1.7 mg/dL (ref 1.7–2.4)

## 2024-06-16 LAB — BRAIN NATRIURETIC PEPTIDE: B Natriuretic Peptide: 227.9 pg/mL — ABNORMAL HIGH (ref 0.0–100.0)

## 2024-06-16 MED ORDER — LATANOPROST 0.005 % OP SOLN
1.0000 [drp] | Freq: Every morning | OPHTHALMIC | Status: DC
  Administered 2024-06-17 – 2024-06-24 (×7): 1 [drp] via OPHTHALMIC
  Filled 2024-06-16: qty 2.5

## 2024-06-16 MED ORDER — ALBUTEROL SULFATE (2.5 MG/3ML) 0.083% IN NEBU: 2.5000 mg | INHALATION_SOLUTION | RESPIRATORY_TRACT | Status: DC | PRN

## 2024-06-16 MED ORDER — POTASSIUM CHLORIDE 10 MEQ/100ML IV SOLN
10.0000 meq | INTRAVENOUS | Status: AC
  Administered 2024-06-16 (×2): 10 meq via INTRAVENOUS
  Filled 2024-06-16 (×2): qty 100

## 2024-06-16 MED ORDER — IPRATROPIUM-ALBUTEROL 0.5-2.5 (3) MG/3ML IN SOLN
3.0000 mL | RESPIRATORY_TRACT | Status: AC
  Administered 2024-06-16 (×3): 3 mL via RESPIRATORY_TRACT
  Filled 2024-06-16: qty 9

## 2024-06-16 MED ORDER — IPRATROPIUM-ALBUTEROL 0.5-2.5 (3) MG/3ML IN SOLN
3.0000 mL | Freq: Four times a day (QID) | RESPIRATORY_TRACT | Status: DC
  Administered 2024-06-16 – 2024-06-19 (×10): 3 mL via RESPIRATORY_TRACT
  Filled 2024-06-16 (×9): qty 3

## 2024-06-16 MED ORDER — APIXABAN 2.5 MG PO TABS
2.5000 mg | ORAL_TABLET | Freq: Two times a day (BID) | ORAL | Status: DC
  Administered 2024-06-16 – 2024-06-24 (×16): 2.5 mg via ORAL
  Filled 2024-06-16 (×16): qty 1

## 2024-06-16 MED ORDER — ACETAMINOPHEN 650 MG RE SUPP: 650.0000 mg | Freq: Four times a day (QID) | RECTAL | Status: DC | PRN

## 2024-06-16 MED ORDER — ACETAMINOPHEN 325 MG PO TABS
650.0000 mg | ORAL_TABLET | Freq: Four times a day (QID) | ORAL | Status: DC | PRN
Start: 2024-06-16 — End: 2024-06-24
  Administered 2024-06-17 – 2024-06-21 (×4): 650 mg via ORAL
  Filled 2024-06-16 (×5): qty 2

## 2024-06-16 MED ORDER — ARFORMOTEROL TARTRATE 15 MCG/2ML IN NEBU
15.0000 ug | INHALATION_SOLUTION | Freq: Two times a day (BID) | RESPIRATORY_TRACT | Status: DC
  Administered 2024-06-16 – 2024-06-24 (×16): 15 ug via RESPIRATORY_TRACT
  Filled 2024-06-16 (×16): qty 2

## 2024-06-16 MED ORDER — METHYLPREDNISOLONE SODIUM SUCC 125 MG IJ SOLR
125.0000 mg | Freq: Every day | INTRAMUSCULAR | Status: DC
  Administered 2024-06-17 – 2024-06-18 (×2): 125 mg via INTRAVENOUS
  Filled 2024-06-16 (×2): qty 2

## 2024-06-16 MED ORDER — HYDRALAZINE HCL 20 MG/ML IJ SOLN
5.0000 mg | Freq: Four times a day (QID) | INTRAMUSCULAR | Status: DC | PRN
  Administered 2024-06-21: 5 mg via INTRAVENOUS
  Filled 2024-06-16 (×2): qty 1

## 2024-06-16 MED ORDER — METHYLPREDNISOLONE SODIUM SUCC 125 MG IJ SOLR
125.0000 mg | Freq: Once | INTRAMUSCULAR | Status: AC
  Administered 2024-06-16: 125 mg via INTRAVENOUS
  Filled 2024-06-16: qty 2

## 2024-06-16 MED ORDER — UMECLIDINIUM BROMIDE 62.5 MCG/ACT IN AEPB
1.0000 | INHALATION_SPRAY | Freq: Every day | RESPIRATORY_TRACT | Status: DC
  Filled 2024-06-16 (×2): qty 7

## 2024-06-16 MED ORDER — GABAPENTIN 100 MG PO CAPS: 100.0000 mg | ORAL_CAPSULE | ORAL | Status: DC

## 2024-06-16 MED ORDER — POTASSIUM CHLORIDE CRYS ER 20 MEQ PO TBCR
20.0000 meq | EXTENDED_RELEASE_TABLET | Freq: Once | ORAL | Status: AC
  Administered 2024-06-16: 20 meq via ORAL
  Filled 2024-06-16: qty 1

## 2024-06-16 MED ORDER — ROSUVASTATIN CALCIUM 20 MG PO TABS
20.0000 mg | ORAL_TABLET | Freq: Every day | ORAL | Status: DC
  Administered 2024-06-17 – 2024-06-24 (×8): 20 mg via ORAL
  Filled 2024-06-16 (×8): qty 1

## 2024-06-16 MED ORDER — NITROGLYCERIN 0.4 MG SL SUBL: 0.4000 mg | SUBLINGUAL_TABLET | SUBLINGUAL | Status: DC | PRN

## 2024-06-16 MED ORDER — GABAPENTIN 100 MG PO CAPS
200.0000 mg | ORAL_CAPSULE | Freq: Every day | ORAL | Status: DC
  Administered 2024-06-16 – 2024-06-23 (×8): 200 mg via ORAL
  Filled 2024-06-16 (×8): qty 2

## 2024-06-16 MED ORDER — POTASSIUM CHLORIDE CRYS ER 20 MEQ PO TBCR
40.0000 meq | EXTENDED_RELEASE_TABLET | Freq: Four times a day (QID) | ORAL | Status: AC
  Administered 2024-06-16 (×2): 40 meq via ORAL
  Filled 2024-06-16 (×2): qty 2

## 2024-06-16 MED ORDER — PANTOPRAZOLE SODIUM 40 MG PO TBEC
40.0000 mg | DELAYED_RELEASE_TABLET | Freq: Every day | ORAL | Status: DC
  Administered 2024-06-17 – 2024-06-24 (×8): 40 mg via ORAL
  Filled 2024-06-16 (×8): qty 1

## 2024-06-16 MED ORDER — DORZOLAMIDE HCL 2 % OP SOLN
1.0000 [drp] | Freq: Two times a day (BID) | OPHTHALMIC | Status: DC
  Administered 2024-06-16 – 2024-06-24 (×16): 1 [drp] via OPHTHALMIC
  Filled 2024-06-16: qty 10

## 2024-06-16 MED ORDER — GABAPENTIN 100 MG PO CAPS
100.0000 mg | ORAL_CAPSULE | Freq: Every morning | ORAL | Status: DC
  Administered 2024-06-17 – 2024-06-24 (×8): 100 mg via ORAL
  Filled 2024-06-16 (×8): qty 1

## 2024-06-16 NOTE — ED Triage Notes (Signed)
 Pt bib [facility] coming from home with increasing SOB with exertion x a few days. Pt doubled lasix  yesterday but no change. Pt showing labored breathing. EMS had patient on NRB. Pt on RA SpO2 100% during triage. Pt does not wear O2 at home. Pt did have a fall a few days ago and has injuries to hand. Pt denies chest pain. GCS 15.   EMS: 60 HR 156/98 98% NRB  18 right wrist

## 2024-06-16 NOTE — ED Notes (Signed)
Informed RN of monitor alerts ?

## 2024-06-16 NOTE — H&P (Signed)
 [facility] H&P   Patient Demographics:    Jenny [name], is a 89 y.o. female  MRN: [id number]   DOB - 1933/11/16  Admit Date - 06/16/2024  Outpatient Primary MD for the patient is [facility]  Referring MD/NP/PA: Dr Corinthia  Patient coming from: home  Chief Complaint  Patient presents with   Shortness of Breath      HPI:    Jenny [name]  is a 89 y.o. female, with medical history significant of HFpEF, atrial fibrillation on Eliquis , COPD, hypertension, tachybradycardia syndrome, hyperlipidemia, CAD, presents to the emergency department with worsening shortness of breath.  Patient reports ongoing dyspnea, progressive over the last few months to few weeks, but much worsened recently, she was seen by nephrology recently, where she was instructed to increase her oral Lasix , reports not much improvement of her symptoms, she denies any chest pain, fever or chills, reports she had recently mechanical fall causing swelling of her left index finger and right hand. - In ED she had low-grade temperature 99.1, she had no hypoxia, but had significantly diminished air entry bilaterally, chest x-ray with no evidence of volume overload, her BNP was elevated at 227, but this is less than her baseline most recently at 441, high-sensitivity troponins elevated at 28, labs were significant for hypokalemia 2.8, and creatinine elevated at 1.9 from baseline of 1.5, patient received IV steroids, nebulizer treatment with improvement of her symptoms, [facility] hospitalist consulted to admit.    Review of systems:     A full 10 point Review of Systems was done, except as stated above, all other Review of Systems were negative.   With Past History of the following :    Past Medical History:  Diagnosis Date   ACUT DUOD ULCER W/HEMORR&PERF W/O MENTION OBST 10/05/2009   NSAID induced   ALLERGIC RHINITIS  CAUSE UNSPECIFIED    ANEMIA-NOS    CAD (coronary artery disease) 06/08/2009   DEs RCA with 70% LAD and EF 60%   CHF (congestive heart failure) (HCC)    COPD    mild obst on PFTs 03/2010   Diabetes mellitus 06/2010 dx   Mild, diet controlled   GLAUCOMA    HYPERLIPIDEMIA    HYPERTENSION, BENIGN    MYOCARDIAL INFARCTION 06/08/2009   des to rca   Persistent atrial fibrillation (HCC)    Dx 08/2021   TOBACCO ABUSE       Past Surgical History:  Procedure Laterality Date   CARDIOVERSION N/A 01/30/2022   Procedure: CARDIOVERSION;  Surgeon: Alveta Aleene PARAS, MD;  Location: [facility] ENDOSCOPY;  Service: Cardiovascular;  Laterality: N/A;   HEMORRHOID SURGERY  1990   Right knee surgery        Social History:     Social History   Tobacco Use   Smoking status: Former    Current packs/day: 0.00    Average packs/day: 0.5 packs/day for  60.0 years (30.0 ttl pk-yrs)    Types: Cigarettes    Start date: 11/12/1961    Quit date: 11/12/2021    Years since quitting: 2.5   Smokeless tobacco: Never   Tobacco comments:    She lives in [place] with her significant other (Charles [place])0  Substance Use Topics   Alcohol use: Yes    Alcohol/week: 1.0 - 2.0 standard drink of alcohol    Types: 1 - 2 Glasses of wine per week    Comment: once or twice a year glass of wine 12/25/21       Family History :     Family History  Problem Relation Age of Onset   Hypertension Mother    Ulcers Mother    Stomach cancer Father    Stroke Maternal Grandmother    Heart attack Neg Hx       Home Medications:   Prior to Admission medications   Medication Sig Start Date End Date Taking? Authorizing Provider  metolazone  (ZAROXOLYN ) 2.5 MG tablet Take 1 tablet (2.5 mg total) by mouth as needed. 02/19/24 05/19/24  Donette Ellouise LABOR, FNP  acetaminophen  (TYLENOL ) 500 MG tablet Take 500 mg by mouth every 6 (six) hours as needed. Takes two tablets in the morning and two tablets at night    [provider]  albuterol   (VENTOLIN  HFA) 108 (90 Base) MCG/ACT inhaler INHALE ONE PUFF EVERY 6 HOURS AS NEEDED FOR SHORTNESS OF BREATH 11/03/21   Claudene Arthea SQUIBB, MD  apixaban  (ELIQUIS ) 2.5 MG TABS tablet Take 1 tablet (2.5 mg total) by mouth 2 (two) times daily. 11/10/23   Lelon Hamilton T, PA-C  dorzolamide  (TRUSOPT ) 2 % ophthalmic solution Place 1 drop into both eyes 2 (two) times daily.    [provider]  FEROSUL 325 (65 Fe) MG tablet Take 325 mg by mouth 2 (two) times daily. 09/30/23   [provider]  gabapentin  (NEURONTIN ) 100 MG capsule Take 100-200 mg by mouth See admin instructions. Take 1 capsule (100mg ) by mouth every morning and take 2 capsules (200mg ) by mouth at bedtime    [provider]  hydrOXYzine (ATARAX) 25 MG tablet Take 25 mg by mouth every 8 (eight) hours as needed for itching. 06/06/22   [provider]  ipratropium-albuterol  (DUONEB) 0.5-2.5 (3) MG/3ML SOLN Take 3 mLs by nebulization every 6 (six) hours as needed. 11/06/21   Patel, Sona, MD  latanoprost  (XALATAN ) 0.005 % ophthalmic solution Place 1 drop into both eyes in the morning.    [provider]  NITROSTAT  0.4 MG SL tablet DISSOLVE ONE TABLET UNDER TONGUE AS NEEDED FOR CHEST PAIN - MAY REPEAT TWICE-IF NO RELIEF GO TO NEAREST HOSPITAL ER 05/06/13   Inocencio Berwyn LABOR, MD  nystatin  (MYCOSTATIN /NYSTOP ) powder Apply 1 application topically daily. 01/22/22   [provider]  pantoprazole  (PROTONIX ) 40 MG tablet Take 1 tablet (40 mg total) by mouth daily. 10/14/23   Lelon Hamilton T, PA-C  rosuvastatin  (CRESTOR ) 20 MG tablet Take 20 mg by mouth daily.    [provider]  Tiotropium Bromide-Olodaterol (STIOLTO RESPIMAT) 2.5-2.5 MCG/ACT AERS 2 puffs Inhalation Once a day for 30 days    [provider]  torsemide  (DEMADEX ) 20 MG tablet Take 2 tablets by mouth in the a.m and 1 tablet by mouth in the p.m for 2 days then go back to 1 tablet by mouth twice a day 10/14/23   Lelon Hamilton T,  PA-C     Allergies:  Allergies  Allergen Reactions   Entresto  [Sacubitril -Valsartan ] Shortness Of Breath    COPD and reports increased SOB with Entresto    Aspirin  Other (See Comments)    Can take 81 mg not 325 mg   Atorvastatin  Itching    Itching and myaliga  Other Reaction(s): itching, rash, Other (See Comments)   Cyclobenzaprine     Other reaction(s): muscle relaxers-ulcer hemorrhage (hospitalized)  Other Reaction(s): Other (See Comments), ulcer hemorrhage   Lactose Intolerance (Gi) Other (See Comments)    GI upset   Meperidine Hcl Other (See Comments)    Not known   Pravastatin  Rash    rash  Other Reaction(s): Other (See Comments)   Propoxyphene     Other reaction(s): pain meds, hallucination, vomiting  Other Reaction(s): Hallucination, hallucinations, vomiting, Other (See Comments), Unknown   Wound Dressing Adhesive     Other reaction(s): red, stings     Physical Exam:   Vitals  Blood pressure (!) 128/91, pulse (!) 58, temperature 99.1 F (37.3 C), temperature source Oral, resp. rate 18, height 5' 2 (1.575 m), weight 88.9 kg, SpO2 98%.   1. General Frail, deconditioned female, laying in bed in no apparent distress  2. Normal affect and insight, Not Suicidal or Homicidal, Awake Alert, Oriented X 3.  3. No F.N deficits, ALL C.Nerves Intact, Strength 5/5 all 4 extremities, Sensation intact all 4 extremities, Plantars down going.  4. Ears and Eyes appear Normal, Conjunctivae clear, PERRLA. Moist Oral Mucosa.  5. Supple Neck, No JVD, No cervical lymphadenopathy appriciated, No Carotid Bruits.  6. Symmetrical Chest wall movement, decreased air wheezing with mild Rales  7.  Bradycardic, No Gallops, Rubs or Murmurs, No Parasternal Heave.  8. Positive Bowel Sounds, Abdomen Soft, No tenderness, No organomegaly appriciated,No rebound -guarding or rigidity.  9.  No Cyanosis, Normal Skin Turgor, No Skin Rash or Bruise.  10. Good muscle tone,  joints appear  normal , no effusions, Normal ROM.    Data Review:    CBC Recent Labs  Lab 06/16/24 1441  WBC 8.7  HGB 11.6*  HCT 35.3*  PLT 300  MCV 92.2  MCH 30.3  MCHC 32.9  RDW 15.3  LYMPHSABS 0.5*  MONOABS 1.0  EOSABS 0.1  BASOSABS 0.0   ------------------------------------------------------------------------------------------------------------------  Chemistries  Recent Labs  Lab 06/16/24 1539  NA 138  K 2.8*  CL 97*  CO2 27  GLUCOSE 100*  BUN 54*  CREATININE 1.90*  CALCIUM  7.0*  AST 17  ALT 10  ALKPHOS 71  BILITOT 0.7   ------------------------------------------------------------------------------------------------------------------ estimated creatinine clearance is 21.2 mL/min (A) (by C-G formula based on SCr of 1.9 mg/dL (H)). ------------------------------------------------------------------------------------------------------------------ No results for input(s): TSH, T4TOTAL, T3FREE, THYROIDAB in the last 72 hours.  Invalid input(s): FREET3  Coagulation profile No results for input(s): INR, PROTIME in the last 168 hours. ------------------------------------------------------------------------------------------------------------------- No results for input(s): DDIMER in the last 72 hours. -------------------------------------------------------------------------------------------------------------------  Cardiac Enzymes No results for input(s): CKMB, TROPONINI, MYOGLOBIN in the last 168 hours.  Invalid input(s): CK ------------------------------------------------------------------------------------------------------------------    Component Value Date/Time   BNP 227.9 (H) 06/16/2024 1441     ---------------------------------------------------------------------------------------------------------------  Urinalysis No results found for: COLORURINE, APPEARANCEUR, LABSPEC, PHURINE, GLUCOSEU, HGBUR, BILIRUBINUR,  KETONESUR, PROTEINUR, UROBILINOGEN, NITRITE, LEUKOCYTESUR  ----------------------------------------------------------------------------------------------------------------   Imaging Results:    DG Finger Index Left Result Date: 06/16/2024 CLINICAL DATA:  Left finger swelling. EXAM: LEFT INDEX FINGER 2+V COMPARISON:  None Available. FINDINGS: There is no evidence of fracture or dislocation. There is no evidence of arthropathy or  other focal bone abnormality. Mild diffuse soft tissue swelling is noted. IMPRESSION: Mild diffuse soft tissue swelling.  No fracture or dislocation. Electronically Signed   By: Lynwood Landy Raddle M.D.   On: 06/16/2024 15:43   DG Hand Complete Right Result Date: 06/16/2024 CLINICAL DATA:  Right hand pain. EXAM: RIGHT HAND - COMPLETE 3+ VIEW COMPARISON:  None Available. FINDINGS: There is no evidence of fracture or dislocation. Severe degenerative changes seen involving first carpometacarpal joint. Soft tissues are unremarkable. IMPRESSION: Severe osteoarthritis of first carpometacarpal joint. No acute abnormality seen. Electronically Signed   By: Lynwood Landy Raddle M.D.   On: 06/16/2024 15:41   DG Chest 1 View Result Date: 06/16/2024 CLINICAL DATA:  Shortness of breath. EXAM: CHEST  1 VIEW COMPARISON:  September 26, 2023. FINDINGS: Mild cardiomegaly is noted. No acute pulmonary disease is noted. Bony thorax is unremarkable. IMPRESSION: No active disease. Aortic Atherosclerosis (ICD10-I70.0). Electronically Signed   By: Lynwood Landy Raddle M.D.   On: 06/16/2024 15:40    EKG:  Vent. rate 51 BPM PR interval * ms QRS duration 105 ms QT/QTcB 420/383 ms P-R-T axes * -55 16 Atrial fibrillation Inferior infarct, age indeterminate Probable anterior infarct, age indeterminate   Assessment & Plan:    Principal Problem:   COPD exacerbation (HCC) Active Problems:   Tachycardia-bradycardia syndrome (HCC)   Essential hypertension   Stage 3b chronic kidney disease (HCC)   COPD  (chronic obstructive pulmonary disease) (HCC)   Obese   Persistent atrial fibrillation (HCC)   Heart failure (HCC)   Type II diabetes mellitus with peripheral circulatory disorder (HCC)  COPD exacerbation -Patient presents with diminished air entry, dyspnea -Obtain respiratory viral panel -Continue with IV steroids. - Continue with scheduled DuoNebs, and as needed albuterol   AKI on CKD stage IIIb - Most likely due to volume depletion, hypovolemia from increasing home diuresis - Avoid nephrotoxic medications. - Will hold on IV fluids currently, but in the same time I will hold home diuresis.  Elevated troponins -Minimally elevated, she denies any chest pain, this is most likely in the setting of AKI and dyspnea  chronic heart failure with preserved ejection fraction (HFpEF) (HCC) -No evidence of volume overload on imaging, BNP is lower than baseline. - Hold diuresis due to AKI    Tachycardia-bradycardia syndrome (HCC) -Continue to monitor on telemetry   Coronary artery disease involving native coronary artery of native heart without angina pectoris -Continue with Eliquis , statin   Essential hypertension -Not on any home medications -Will keep on as needed hydralazine    Type 2 diabetes, diet controlled (HCC) - Check A1c -given she is on steroids will monitor CBG and if elevated then will start on insulin  sliding scale  HLD (hyperlipidemia) -Continue home statin   PAF (paroxysmal atrial fibrillation) (HCC) -Continue with Eliquis  for anticoagulation-monitor in telemetry  Unspecified glaucoma -Continue home eyedrops    DVT Prophylaxis : Eliquis   AM Labs Ordered, also please review Full Orders  Family Communication: Admission, patients condition and plan of care including tests being ordered have been discussed with the patient who indicate understanding and agree with the plan and Code Status.  Code Status DNR/DNI, she has forms with her bedside, please see pictures  under media section  Likely DC to  home  Consults called: none    Admission status: observation    Time spent in minutes : 70 minutes   Brayton Lye M.D on 06/16/2024 at 4:42 PM   [facility] - Office  [phone]

## 2024-06-16 NOTE — ED Provider Notes (Signed)
 [place] EMERGENCY DEPARTMENT AT [facility] Provider Note   CSN: [id number] Arrival date & time: 06/16/24  1359     Patient presents with: Shortness of Breath   Jenny [name] is a 89 y.o. female.   89 year old female presents emergency department for evaluation of her worsening shortness of breath.  She has a past medical history of CAD, hypertension, pulmonary hypertension, A-fib, CKD, CHF, tobacco use, and tachybradycardia syndrome.  She reports that over the last few months she has had worsening shortness of breath with exertion. She reports seeing her nephrologist recently who recommended she increase her oral Lasix .  She reports increasing the dose but has not had any improvement in her symptoms.  She denies any chest pain or recent fevers.  She reports a recent mechanical fall causing swelling of her left index finger and right hand   Shortness of Breath      Prior to Admission medications   Medication Sig Start Date End Date Taking? Authorizing Provider  metolazone  (ZAROXOLYN ) 2.5 MG tablet Take 1 tablet (2.5 mg total) by mouth as needed. 02/19/24 05/19/24  Donette Ellouise LABOR, FNP  acetaminophen  (TYLENOL ) 500 MG tablet Take 500 mg by mouth every 6 (six) hours as needed. Takes two tablets in the morning and two tablets at night    [provider]  albuterol  (VENTOLIN  HFA) 108 (90 Base) MCG/ACT inhaler INHALE ONE PUFF EVERY 6 HOURS AS NEEDED FOR SHORTNESS OF BREATH 11/03/21   Claudene Arthea SQUIBB, MD  apixaban  (ELIQUIS ) 2.5 MG TABS tablet Take 1 tablet (2.5 mg total) by mouth 2 (two) times daily. 11/10/23   Lelon Hamilton T, PA-C  dorzolamide  (TRUSOPT ) 2 % ophthalmic solution Place 1 drop into both eyes 2 (two) times daily.    [provider]  FEROSUL 325 (65 Fe) MG tablet Take 325 mg by mouth 2 (two) times daily. 09/30/23   [provider]  gabapentin  (NEURONTIN ) 100 MG capsule Take 100-200 mg by mouth See admin instructions. Take 1 capsule (100mg ) by  mouth every morning and take 2 capsules (200mg ) by mouth at bedtime    [provider]  hydrOXYzine (ATARAX) 25 MG tablet Take 25 mg by mouth every 8 (eight) hours as needed for itching. 06/06/22   [provider]  ipratropium-albuterol  (DUONEB) 0.5-2.5 (3) MG/3ML SOLN Take 3 mLs by nebulization every 6 (six) hours as needed. 11/06/21   Patel, Sona, MD  latanoprost  (XALATAN ) 0.005 % ophthalmic solution Place 1 drop into both eyes in the morning.    [provider]  NITROSTAT  0.4 MG SL tablet DISSOLVE ONE TABLET UNDER TONGUE AS NEEDED FOR CHEST PAIN - MAY REPEAT TWICE-IF NO RELIEF GO TO NEAREST HOSPITAL ER 05/06/13   Inocencio Berwyn LABOR, MD  nystatin  (MYCOSTATIN /NYSTOP ) powder Apply 1 application topically daily. 01/22/22   [provider]  pantoprazole  (PROTONIX ) 40 MG tablet Take 1 tablet (40 mg total) by mouth daily. 10/14/23   Lelon Hamilton T, PA-C  rosuvastatin  (CRESTOR ) 20 MG tablet Take 20 mg by mouth daily.    [provider]  Tiotropium Bromide-Olodaterol (STIOLTO RESPIMAT) 2.5-2.5 MCG/ACT AERS 2 puffs Inhalation Once a day for 30 days    [provider]  torsemide  (DEMADEX ) 20 MG tablet Take 2 tablets by mouth in the a.m and 1 tablet by mouth in the p.m for 2 days then go back to 1 tablet by mouth twice a day 10/14/23   Lelon Hamilton T, PA-C    Allergies: Entresto  [sacubitril -valsartan ], Aspirin ,  Atorvastatin , Cyclobenzaprine, Lactose intolerance (gi), Meperidine hcl, Pravastatin , Propoxyphene, and Wound dressing adhesive    Review of Systems  Respiratory:  Positive for shortness of breath.   All other systems reviewed and are negative.   Updated Vital Signs BP (!) 128/91 (BP Location: Right Arm)   Pulse (!) 58   Temp 99.1 F (37.3 C) (Oral)   Resp 18   Ht 5' 2 (1.575 m)   Wt 88.9 kg   SpO2 98%   BMI 35.85 kg/m   Physical Exam Vitals and nursing note reviewed.  Constitutional:      General: She is not in acute  distress. HENT:     Head: Normocephalic.  Eyes:     Extraocular Movements: Extraocular movements intact.  Cardiovascular:     Rate and Rhythm: Bradycardia present. Rhythm irregular.  Pulmonary:     Effort: Tachypnea and accessory muscle usage present.     Breath sounds: Decreased breath sounds present.  Musculoskeletal:     Right lower leg: Edema present.     Left lower leg: Edema present.     Comments: Left index finger swollen Hand swelling on right hand  Skin:    General: Skin is warm.  Neurological:     Mental Status: She is alert and oriented to person, place, and time.     (all labs ordered are listed, but only abnormal results are displayed) Labs Reviewed  BRAIN NATRIURETIC PEPTIDE - Abnormal; Notable for the following components:      Result Value   B Natriuretic Peptide 227.9 (*)    All other components within normal limits  CBC WITH DIFFERENTIAL/PLATELET - Abnormal; Notable for the following components:   RBC 3.83 (*)    Hemoglobin 11.6 (*)    HCT 35.3 (*)    Lymphs Abs 0.5 (*)    All other components within normal limits  COMPREHENSIVE METABOLIC PANEL WITH GFR - Abnormal; Notable for the following components:   Potassium 2.8 (*)    Chloride 97 (*)    Glucose, Bld 100 (*)    BUN 54 (*)    Creatinine, Ser 1.90 (*)    Calcium  7.0 (*)    Total Protein 6.2 (*)    Albumin 2.8 (*)    GFR, Estimated 25 (*)    All other components within normal limits  TROPONIN I (HIGH SENSITIVITY) - Abnormal; Notable for the following components:   Troponin I (High Sensitivity) 28 (*)    All other components within normal limits  TROPONIN I (HIGH SENSITIVITY)    EKG: EKG Interpretation Date/Time:  Wednesday June 16 2024 14:49:07 EDT Ventricular Rate:  51 PR Interval:    QRS Duration:  105 QT Interval:  420 QTC Calculation: 383 R Axis:   -55  Text Interpretation: Atrial fibrillation Inferior infarct, age indeterminate Probable anterior infarct, age indeterminate  Confirmed by Corinthia No [id number]) on 06/16/2024 2:52:59 PM  Radiology: [facility] Finger Index Left Result Date: 06/16/2024 CLINICAL DATA:  Left finger swelling. EXAM: LEFT INDEX FINGER 2+V COMPARISON:  None Available. FINDINGS: There is no evidence of fracture or dislocation. There is no evidence of arthropathy or other focal bone abnormality. Mild diffuse soft tissue swelling is noted. IMPRESSION: Mild diffuse soft tissue swelling.  No fracture or dislocation. Electronically Signed   By: Lynwood Landy Raddle M.D.   On: 06/16/2024 15:43   DG Hand Complete Right Result Date: 06/16/2024 CLINICAL DATA:  Right hand pain. EXAM: RIGHT HAND - COMPLETE 3+ VIEW COMPARISON:  None Available. FINDINGS:  There is no evidence of fracture or dislocation. Severe degenerative changes seen involving first carpometacarpal joint. Soft tissues are unremarkable. IMPRESSION: Severe osteoarthritis of first carpometacarpal joint. No acute abnormality seen. Electronically Signed   By: Lynwood Landy Raddle M.D.   On: 06/16/2024 15:41   DG Chest 1 View Result Date: 06/16/2024 CLINICAL DATA:  Shortness of breath. EXAM: CHEST  1 VIEW COMPARISON:  September 26, 2023. FINDINGS: Mild cardiomegaly is noted. No acute pulmonary disease is noted. Bony thorax is unremarkable. IMPRESSION: No active disease. Aortic Atherosclerosis (ICD10-I70.0). Electronically Signed   By: Lynwood Landy Raddle M.D.   On: 06/16/2024 15:40     Procedures   Medications Ordered in the ED  ipratropium-albuterol  (DUONEB) 0.5-2.5 (3) MG/3ML nebulizer solution 3 mL (has no administration in time range)  methylPREDNISolone  sodium succinate (SOLU-MEDROL ) 125 mg/2 mL injection 125 mg (has no administration in time range)  potassium chloride  10 mEq in 100 mL IVPB (has no administration in time range)  potassium chloride  SA (KLOR-CON  M) CR tablet 20 mEq (has no administration in time range)                                    Medical Decision Making 89 year old female with an extensive  past medical history who presents emergency department with ongoing shortness of breath that has worsened despite increasing her home Lasix .  She reports difficulty walking across the house due to her worsening shortness of breath.  She denies any chest pain.  Patient reports that her heart rate is always in the 40-50s and she has been evaluated by EP in the past-EP reportedly told her that she did not require a pacemaker.  She reports that her heart rate is typically in the slow and she is not typically the symptomatic.  She is 100% on room air, however is noticeably tachypneic with increased work of breathing. Lab work and imaging ordered to rule out any acute process Patient does have an elevated troponin, however this appears at her typical baseline. Elevated BNP consistent with her prior levels. CMP does show low potassium 2.8.  She also appears to have an AKI with increased creatinine from her baseline.  This is likely due to her doubling her daily Lasix  in attempt to improve her shortness of breath. DuoNebs and steroids ordered for treatment of her COPD exacerbation. Patient will need admission for continued treatment of her acute COPD exacerbation.  Dx: COPD exacerbation, tachypnea, AKI, hypokalemia    Amount and/or Complexity of Data Reviewed Labs: ordered. Radiology: ordered.  Risk Prescription drug management.    Final diagnoses:  SOB (shortness of breath)  COPD exacerbation (HCC)  AKI (acute kidney injury) (HCC)  Hypokalemia    ED Discharge Orders     None          Allura Doepke, DO 06/16/24 1622

## 2024-06-16 NOTE — Progress Notes (Signed)
 Patient declines to have blood sugar to checked. States I have never had my blood sugar checked before or have been treated for it'. Patient is visibly upset. Patient education given but patient is not receptive.

## 2024-06-17 ENCOUNTER — Encounter (HOSPITAL_COMMUNITY): Payer: Self-pay | Admitting: Internal Medicine

## 2024-06-17 ENCOUNTER — Other Ambulatory Visit (HOSPITAL_COMMUNITY): Payer: Self-pay

## 2024-06-17 ENCOUNTER — Telehealth (HOSPITAL_COMMUNITY): Payer: Self-pay | Admitting: Pharmacy Technician

## 2024-06-17 DIAGNOSIS — I272 Pulmonary hypertension, unspecified: Secondary | ICD-10-CM | POA: Diagnosis not present

## 2024-06-17 DIAGNOSIS — I495 Sick sinus syndrome: Secondary | ICD-10-CM | POA: Diagnosis present

## 2024-06-17 DIAGNOSIS — I2510 Atherosclerotic heart disease of native coronary artery without angina pectoris: Secondary | ICD-10-CM | POA: Diagnosis present

## 2024-06-17 DIAGNOSIS — I482 Chronic atrial fibrillation, unspecified: Secondary | ICD-10-CM | POA: Diagnosis not present

## 2024-06-17 DIAGNOSIS — I441 Atrioventricular block, second degree: Secondary | ICD-10-CM | POA: Diagnosis present

## 2024-06-17 DIAGNOSIS — R0609 Other forms of dyspnea: Secondary | ICD-10-CM | POA: Diagnosis not present

## 2024-06-17 DIAGNOSIS — N179 Acute kidney failure, unspecified: Secondary | ICD-10-CM | POA: Diagnosis present

## 2024-06-17 DIAGNOSIS — E66812 Obesity, class 2: Secondary | ICD-10-CM | POA: Diagnosis present

## 2024-06-17 DIAGNOSIS — Z79899 Other long term (current) drug therapy: Secondary | ICD-10-CM | POA: Diagnosis not present

## 2024-06-17 DIAGNOSIS — E1151 Type 2 diabetes mellitus with diabetic peripheral angiopathy without gangrene: Secondary | ICD-10-CM | POA: Diagnosis present

## 2024-06-17 DIAGNOSIS — D649 Anemia, unspecified: Secondary | ICD-10-CM | POA: Diagnosis present

## 2024-06-17 DIAGNOSIS — I342 Nonrheumatic mitral (valve) stenosis: Secondary | ICD-10-CM | POA: Diagnosis not present

## 2024-06-17 DIAGNOSIS — I5032 Chronic diastolic (congestive) heart failure: Secondary | ICD-10-CM

## 2024-06-17 DIAGNOSIS — I4821 Permanent atrial fibrillation: Secondary | ICD-10-CM | POA: Diagnosis present

## 2024-06-17 DIAGNOSIS — I130 Hypertensive heart and chronic kidney disease with heart failure and stage 1 through stage 4 chronic kidney disease, or unspecified chronic kidney disease: Secondary | ICD-10-CM | POA: Diagnosis present

## 2024-06-17 DIAGNOSIS — Z66 Do not resuscitate: Secondary | ICD-10-CM | POA: Diagnosis present

## 2024-06-17 DIAGNOSIS — E876 Hypokalemia: Secondary | ICD-10-CM | POA: Diagnosis present

## 2024-06-17 DIAGNOSIS — Z7901 Long term (current) use of anticoagulants: Secondary | ICD-10-CM | POA: Diagnosis not present

## 2024-06-17 DIAGNOSIS — I2721 Secondary pulmonary arterial hypertension: Secondary | ICD-10-CM | POA: Diagnosis present

## 2024-06-17 DIAGNOSIS — E1122 Type 2 diabetes mellitus with diabetic chronic kidney disease: Secondary | ICD-10-CM | POA: Diagnosis present

## 2024-06-17 DIAGNOSIS — B372 Candidiasis of skin and nail: Secondary | ICD-10-CM | POA: Diagnosis present

## 2024-06-17 DIAGNOSIS — Z1152 Encounter for screening for COVID-19: Secondary | ICD-10-CM | POA: Diagnosis not present

## 2024-06-17 DIAGNOSIS — N1832 Chronic kidney disease, stage 3b: Secondary | ICD-10-CM | POA: Diagnosis present

## 2024-06-17 DIAGNOSIS — Z87891 Personal history of nicotine dependence: Secondary | ICD-10-CM | POA: Diagnosis not present

## 2024-06-17 DIAGNOSIS — R0602 Shortness of breath: Principal | ICD-10-CM

## 2024-06-17 DIAGNOSIS — J441 Chronic obstructive pulmonary disease with (acute) exacerbation: Secondary | ICD-10-CM | POA: Diagnosis present

## 2024-06-17 DIAGNOSIS — E785 Hyperlipidemia, unspecified: Secondary | ICD-10-CM | POA: Diagnosis present

## 2024-06-17 DIAGNOSIS — Z8249 Family history of ischemic heart disease and other diseases of the circulatory system: Secondary | ICD-10-CM | POA: Diagnosis not present

## 2024-06-17 DIAGNOSIS — J449 Chronic obstructive pulmonary disease, unspecified: Secondary | ICD-10-CM

## 2024-06-17 DIAGNOSIS — I2729 Other secondary pulmonary hypertension: Secondary | ICD-10-CM | POA: Diagnosis present

## 2024-06-17 DIAGNOSIS — I5033 Acute on chronic diastolic (congestive) heart failure: Secondary | ICD-10-CM | POA: Diagnosis present

## 2024-06-17 LAB — RENAL FUNCTION PANEL
Albumin: 3 g/dL — ABNORMAL LOW (ref 3.5–5.0)
Anion gap: 14 (ref 5–15)
BUN: 61 mg/dL — ABNORMAL HIGH (ref 8–23)
CO2: 23 mmol/L (ref 22–32)
Calcium: 8 mg/dL — ABNORMAL LOW (ref 8.9–10.3)
Chloride: 97 mmol/L — ABNORMAL LOW (ref 98–111)
Creatinine, Ser: 2.38 mg/dL — ABNORMAL HIGH (ref 0.44–1.00)
GFR, Estimated: 19 mL/min — ABNORMAL LOW (ref >60)
Glucose, Bld: 226 mg/dL — ABNORMAL HIGH (ref 70–99)
Phosphorus: 3.8 mg/dL (ref 2.5–4.6)
Potassium: 4 mmol/L (ref 3.5–5.1)
Sodium: 134 mmol/L — ABNORMAL LOW (ref 135–145)

## 2024-06-17 LAB — CBC
HCT: 35.5 % — ABNORMAL LOW (ref 36.0–46.0)
Hemoglobin: 11.4 g/dL — ABNORMAL LOW (ref 12.0–15.0)
MCH: 29.5 pg (ref 26.0–34.0)
MCHC: 32.1 g/dL (ref 30.0–36.0)
MCV: 92 fL (ref 80.0–100.0)
Platelets: 317 K/uL (ref 150–400)
RBC: 3.86 MIL/uL — ABNORMAL LOW (ref 3.87–5.11)
RDW: 15 % (ref 11.5–15.5)
WBC: 8.2 K/uL (ref 4.0–10.5)
nRBC: 0 % (ref 0.0–0.2)

## 2024-06-17 LAB — BASIC METABOLIC PANEL WITH GFR
Anion gap: 19 — ABNORMAL HIGH (ref 5–15)
BUN: 55 mg/dL — ABNORMAL HIGH (ref 8–23)
CO2: 22 mmol/L (ref 22–32)
Calcium: 7.7 mg/dL — ABNORMAL LOW (ref 8.9–10.3)
Chloride: 97 mmol/L — ABNORMAL LOW (ref 98–111)
Creatinine, Ser: 2.76 mg/dL — ABNORMAL HIGH (ref 0.44–1.00)
GFR, Estimated: 16 mL/min — ABNORMAL LOW (ref >60)
Glucose, Bld: 227 mg/dL — ABNORMAL HIGH (ref 70–99)
Potassium: 3.9 mmol/L (ref 3.5–5.1)
Sodium: 138 mmol/L (ref 135–145)

## 2024-06-17 MED ORDER — ORAL CARE MOUTH RINSE
15.0000 mL | OROMUCOSAL | Status: DC | PRN
Start: 2024-06-17 — End: 2024-06-24

## 2024-06-17 MED ORDER — SODIUM CHLORIDE 0.9 % IV SOLN: INTRAVENOUS | Status: DC

## 2024-06-17 MED ORDER — BEVESPI AEROSPHERE 9-4.8 MCG/ACT IN AERO
2.0000 | INHALATION_SPRAY | Freq: Two times a day (BID) | RESPIRATORY_TRACT | 1 refills | Status: DC
  Filled 2024-06-17: qty 10.7, 30d supply, fill #0

## 2024-06-17 NOTE — Telephone Encounter (Signed)
 Patient Product/process development scientist completed.    The patient is insured through [employer]. Patient has Medicare and is not eligible for a copay card, but may be able to apply for patient assistance or Medicare RX Payment Plan (Patient Must reach out to their plan, if eligible for payment plan), if available.    Ran test claim for Breo Ellipta  100 and the current 30 day co-pay is $57.28.  Ran test claim for Bevespi   and the current 30 day co-pay is $62.56.  Ran test claim for Anoro Ellipta  and the current 30 day co-pay is $68.80.  Ran test claim for Symbicort  Brand and Generic and Not on Formulary  Ran test claim for Advair Diskus Brand and Generic and Not on Formulary   This test claim was processed through [facility]- copay amounts may vary at other pharmacies due to [employer], or as the patient moves through the different stages of their insurance plan.     Reyes Sharps, CPHT Pharmacy Technician III Certified Patient Advocate [facility] Patient Advocate Team Direct Number: [phone]  Fax: [phone]

## 2024-06-17 NOTE — Evaluation (Signed)
 Occupational Therapy Evaluation Patient Details Name: Jenny [name] MRN: [id number] DOB: 06/24/1933 Today's Date: 06/17/2024   History of Present Illness   Pt is a 89 yr old female who presented 06/16/24 from home due to SOB. She also reports a fall causing edema in L index finger and R ahnd. PMH: HFpEF, CKD stage 3b, HTN, T2DM, COPD, Afib, CAD     Clinical Impressions Pt reported at PLOF they were living home alone but has family support who can assist as needed. She was driving, grocery shopping and mailed in medications. At this time she used 4WW in session with supervision to [facility] and was able to use as a seated break as needed while completion of ADLS at sink level. Pt reports no longer having discomfort with hands from fall. At this time recommendation for [facility] therapy with the return to home.      If plan is discharge home, recommend the following:   A little help with walking and/or transfers;A little help with bathing/dressing/bathroom;Assistance with cooking/housework;Help with stairs or ramp for entrance     Functional Status Assessment   Patient has had a recent decline in their functional status and demonstrates the ability to make significant improvements in function in a reasonable and predictable amount of time.     Equipment Recommendations   None recommended by OT     Recommendations for Other Services         Precautions/Restrictions   Precautions Precautions: Fall Recall of Precautions/Restrictions: Intact Restrictions Weight Bearing Restrictions Per Provider Order: No     Mobility Bed Mobility Overal bed mobility: Needs Assistance             General bed mobility comments: presented in chair    Transfers Overall transfer level: Needs assistance Equipment used: Rollator (4 wheels) Transfers: Sit to/from Stand Sit to Stand: Supervision           General transfer comment: cues with position of walker for seated rest break as needed  with care at sink      Balance Overall balance assessment: Needs assistance Sitting-balance support: No upper extremity supported, Feet supported Sitting balance-Leahy Scale: Good     Standing balance support: No upper extremity supported, Bilateral upper extremity supported Standing balance-Leahy Scale: Fair Standing balance comment: completed ADLS with no UE at sink level but easily fatigues                           ADL either performed or assessed with clinical judgement   ADL Overall ADL's : Needs assistance/impaired Eating/Feeding: Independent;Sitting   Grooming: Oral care;Standing;Sitting;Supervision/safety   Upper Body Bathing: Contact guard assist;Standing   Lower Body Bathing: Supervison/ safety;Sitting/lateral leans   Upper Body Dressing : Set up;Sitting   Lower Body Dressing: Supervision/safety;Sitting/lateral leans;Sit to/from Database administrator: Supervision/safety;Rollator (4 wheels)   Toileting- Architect and Hygiene: Contact guard assist       Functional mobility during ADLs: Contact guard assist;Rollator (4 wheels)       Vision Baseline Vision/History: 3 Glaucoma;1 Wears glasses;4 Cataracts Patient Visual Report:  (blind in [place])       Perception Perception: Within Functional Limits       Praxis Praxis: WFL       Pertinent Vitals/Pain Pain Assessment Pain Assessment: No/denies pain     Extremity/Trunk Assessment Upper Extremity Assessment Upper Extremity Assessment: Overall WFL for tasks assessed;Right hand dominant   Lower Extremity Assessment Lower Extremity Assessment: Defer  to PT evaluation   Cervical / Trunk Assessment Cervical / Trunk Assessment: Kyphotic   Communication Communication Communication: No apparent difficulties   Cognition Arousal: Alert Behavior During Therapy: WFL for tasks assessed/performed Cognition: No apparent impairments                               Following  commands: Intact       Cueing  General Comments   Cueing Techniques: Verbal cues  RA 94-96% no reports of SOB   Exercises     Shoulder Instructions      Home Living Family/patient expects to be discharged to:: Private residence Living Arrangements: Alone Available Help at Discharge: Family;Neighbor;Available PRN/intermittently Type of Home: House Home Access: Ramped entrance     Home Layout: One level     Bathroom Shower/Tub: Tub/shower unit;Sponge bathes at baseline   Bathroom Toilet: Handicapped height Bathroom Accessibility: No   Home Equipment: Rollator (4 wheels);BSC/3in1;Tub bench;Grab bars - tub/shower (transport chair)          Prior Functioning/Environment Prior Level of Function : Independent/Modified Independent;Driving (pt has nurse once a week, pt reports they go out grocery shopping and meds drive but if they can not make it out they will call DIL)             Mobility Comments: uses rollator for community distances, and intermittent use in the home ADLs Comments: indep    OT Problem List: Decreased strength;Decreased activity tolerance;Impaired balance (sitting and/or standing);Decreased knowledge of use of DME or AE;Cardiopulmonary status limiting activity   OT Treatment/Interventions: Self-care/ADL training;Therapeutic exercise;DME and/or AE instruction;Therapeutic activities;Patient/family education;Balance training      OT Goals(Current goals can be found in the care plan section)   Acute Rehab OT Goals Patient Stated Goal: to go home OT Goal Formulation: With patient Time For Goal Achievement: 07/01/24 Potential to Achieve Goals: Good   OT Frequency:  Min 2X/week    Co-evaluation              AM-PAC OT 6 Clicks Daily Activity     Outcome Measure Help from another person eating meals?: None Help from another person taking care of personal grooming?: A Little Help from another person toileting, which includes using  toliet, bedpan, or urinal?: A Little Help from another person bathing (including washing, rinsing, drying)?: A Little Help from another person to put on and taking off regular upper body clothing?: A Little Help from another person to put on and taking off regular lower body clothing?: A Little 6 Click Score: 19   End of Session Equipment Utilized During Treatment: Gait belt;Rollator (4 wheels) Nurse Communication: Mobility status  Activity Tolerance: Patient tolerated treatment well Patient left: in chair;with call bell/phone within reach;with family/visitor present  OT Visit Diagnosis: Unsteadiness on feet (R26.81);Other abnormalities of gait and mobility (R26.89);Muscle weakness (generalized) (M62.81);History of falling (Z91.81)                Time: 1129-1207 OT Time Calculation (min): 38 min Charges:  OT General Charges $OT Visit: 1 Visit OT Evaluation $OT Eval Low Complexity: 1 Low OT Treatments $Self Care/Home Management : 23-37 mins  Warrick POUR OTR/L  Acute Rehab Services  [phone] office number   Warrick Berber 06/17/2024, 12:21 PM

## 2024-06-17 NOTE — Evaluation (Signed)
 Physical Therapy Evaluation Patient Details Name: Jenny [name] MRN: [id number] DOB: 11/08/1933 Today's Date: 06/17/2024  History of Present Illness  Pt is a 89 yr old female who presented 06/16/24 from home due to SOB. She also reports a fall causing edema in L index finger and R ahnd. PMH: HFpEF, CKD stage 3b, HTN, T2DM, COPD, Afib, CAD  Clinical Impression  Pt admitted with above diagnosis. Independent with intermittent use of rollator in the home, one fall recently while performing an aggressive physical activity late at night. Pt mobilizes at a supervision level today, RW for support to ambulate 100 feet. Moderate to severe dyspnea but quickly improves to mild with seated rest break. HR into 60s. No overt LOB or buckling noted while using appropriate AD for gait. Educated on safety and AD recommendations. Getting HHPT PTA and would benefit to continue. Pt currently with functional limitations due to the deficits listed below (see PT Problem List). Pt will benefit from acute skilled PT to increase their independence and safety with mobility to allow discharge.           If plan is discharge home, recommend the following: A little help with walking and/or transfers;A little help with bathing/dressing/bathroom;Assistance with cooking/housework;Assist for transportation;Help with stairs or ramp for entrance   Can travel by private vehicle        Equipment Recommendations None recommended by PT  Recommendations for Other Services       Functional Status Assessment Patient has had a recent decline in their functional status and demonstrates the ability to make significant improvements in function in a reasonable and predictable amount of time.     Precautions / Restrictions Precautions Precautions: Fall Recall of Precautions/Restrictions: Intact Restrictions Weight Bearing Restrictions Per Provider Order: No      Mobility  Bed Mobility               General bed mobility  comments: sitting EOB    Transfers Overall transfer level: Needs assistance Equipment used: Rolling walker (2 wheels) Transfers: Sit to/from Stand Sit to Stand: Supervision           General transfer comment: Supervision for safety, cues for set up and hand placement, stable with RW for support. Needs cues to utilize device for safety, as she pushed away when performing from standard chair and furniture surfed.    Ambulation/Gait Ambulation/Gait assistance: Supervision Gait Distance (Feet): 100 Feet Assistive device: Rolling walker (2 wheels) Gait Pattern/deviations: Step-through pattern, Decreased stride length, Trunk flexed Gait velocity: dec Gait velocity interpretation: <1.8 ft/sec, indicate of risk for recurrent falls   General Gait Details: Educated on safe AD use with RW for support. No overt buckling or LOB. Moderate to severe dyspnea as she fatigued, SpO2 92-96%. dyspnea resolves to mild upon sitting. Cues for pacing, energy conservation, and symptom awareness. Trunk mildly flexed, educated on upright posture with AD use.  Stairs            Wheelchair Mobility     Tilt Bed    Modified Rankin (Stroke Patients Only)       Balance Overall balance assessment: Needs assistance Sitting-balance support: No upper extremity supported, Feet supported Sitting balance-Leahy Scale: Good     Standing balance support: No upper extremity supported Standing balance-Leahy Scale: Fair Standing balance comment: More stable with RW                             Pertinent  Vitals/Pain Pain Assessment Pain Assessment: No/denies pain    Home Living Family/patient expects to be discharged to:: Private residence Living Arrangements: Alone Available Help at Discharge: Family;Neighbor;Available PRN/intermittently Type of Home: House Home Access: Ramped entrance       Home Layout: One level Home Equipment: Rollator (4 wheels);BSC/3in1;Tub bench;Grab bars -  tub/shower (transport chair)      Prior Function Prior Level of Function : Independent/Modified Independent;Driving (pt has nurse once a week, pt reports they go out grocery shopping and meds drive but if they can not make it out they will call DIL)             Mobility Comments: uses rollator for community distances, and intermittent use in the home ADLs Comments: indep     Extremity/Trunk Assessment   Upper Extremity Assessment Upper Extremity Assessment: Defer to OT evaluation    Lower Extremity Assessment Lower Extremity Assessment: Overall WFL for tasks assessed    Cervical / Trunk Assessment Cervical / Trunk Assessment: (P) Kyphotic  Communication   Communication Communication: No apparent difficulties    Cognition Arousal: Alert Behavior During Therapy: WFL for tasks assessed/performed   PT - Cognitive impairments: No family/caregiver present to determine baseline                         Following commands: Intact       Cueing Cueing Techniques: Verbal cues     General Comments General comments (skin integrity, edema, etc.): SpO2 92-96% oN RA throughout session. Moderate to severe dyspnea at times, but recovers with seated rest. HR into 60s with walking.    Exercises General Exercises - Lower Extremity Ankle Circles/Pumps: AROM, Both, 15 reps, Seated Quad Sets: Strengthening, Both, 10 reps, Seated   Assessment/Plan    PT Assessment Patient needs continued PT services  PT Problem List Decreased activity tolerance;Decreased balance;Decreased mobility;Decreased knowledge of use of DME;Cardiopulmonary status limiting activity       PT Treatment Interventions DME instruction;Gait training;Stair training;Functional mobility training;Therapeutic activities;Therapeutic exercise;Balance training;Neuromuscular re-education;Patient/family education    PT Goals (Current goals can be found in the Care Plan section)  Acute Rehab PT Goals Patient Stated  Goal: Get well go home, breath better PT Goal Formulation: With patient Time For Goal Achievement: 07/01/24 Potential to Achieve Goals: Good    Frequency Min 2X/week     Co-evaluation               AM-PAC PT 6 Clicks Mobility  Outcome Measure Help needed turning from your back to your side while in a flat bed without using bedrails?: None Help needed moving from lying on your back to sitting on the side of a flat bed without using bedrails?: None Help needed moving to and from a bed to a chair (including a wheelchair)?: A Little Help needed standing up from a chair using your arms (e.g., wheelchair or bedside chair)?: A Little Help needed to walk in hospital room?: A Little Help needed climbing 3-5 steps with a railing? : A Little 6 Click Score: 20    End of Session Equipment Utilized During Treatment: Gait belt Activity Tolerance: Patient tolerated treatment well Patient left: in chair;with call bell/phone within reach;with chair alarm set;with nursing/sitter in room Nurse Communication: Mobility status PT Visit Diagnosis: Unsteadiness on feet (R26.81);Other abnormalities of gait and mobility (R26.89)    Time: 8992-8968 PT Time Calculation (min) (ACUTE ONLY): 24 min   Charges:     PT Treatments $Gait Training:  8-22 mins $Therapeutic Activity: 8-22 mins PT General Charges $$ ACUTE PT VISIT: 1 Visit         Leontine Roads, PT, DPT [facility]  Rehabilitation Services Physical Therapist Office: [phone] Website: [email or web address]   Leontine GORMAN Roads 06/17/2024, 12:09 PM

## 2024-06-17 NOTE — Progress Notes (Signed)
 PROGRESS NOTE    Jenny [name]  FMW:[id number] DOB: January 31, 1933 DOA: 06/16/2024 PCP: [facility]     Brief Narrative:  Jenny [name]  is a 89 y.o. female, with medical history significant of HFpEF, atrial fibrillation on Eliquis , COPD, hypertension, tachybradycardia syndrome, hyperlipidemia, CAD, presents to the emergency department with worsening shortness of breath.  Patient reports ongoing dyspnea, progressive over the last few months to few weeks, but much worsened recently, she was seen by nephrology recently, where she was instructed to increase her oral diuretic, reports not much improvement of her symptoms.  In ED she had low-grade temperature 99.1, she had no hypoxia, but had significantly diminished air entry bilaterally, chest x-ray with no evidence of volume overload, her BNP was elevated at 227, but this is less than her baseline most recently at 441, high-sensitivity troponins elevated at 28, labs were significant for hypokalemia 2.8, and creatinine elevated at 1.9 from baseline of 1.5, patient received IV steroids, nebulizer treatment with improvement of her symptoms, [facility] consulted to admit.  New events last 24 hours / Subjective: She was seen walking from bathroom to her bed, purse-lip breathing and significant dyspnea with conversational dyspnea as well. Took her 5-10 min to recover. She states that she is here  because of fluid overload, has been taking lasix  and not peeing as much, weight gain of 10lb in a week, esp around her abdomen   Assessment & Plan:   Principal Problem:   COPD exacerbation (HCC) Active Problems:   Tachycardia-bradycardia syndrome (HCC)   Essential hypertension   Stage 3b chronic kidney disease (HCC)   COPD (chronic obstructive pulmonary disease) (HCC)   Obese   Persistent atrial fibrillation (HCC)   Heart failure (HCC)   Type II diabetes mellitus with peripheral circulatory disorder (HCC)   Dyspnea, multifactorial with  COPD exacerbation and diastolic CHF - COVID/influenza/RSV negative - Neb tx, solumedrol   Chronic diastolic CHF  - BNP mildly elevated 227.9 and chest x-ray did not show vascular congestion, however she admits to dependent fluid and significant weight gain - Followed by Dr. Verlin outpatient - Torsemide  and Zaroxolyn  currently on hold due to worsening renal function - Cardiology consult today  AKI on CKD stage IIIb - Baseline creatinine 1.4.  Admitted with creatinine 1.9 --> 2.76 today  Demand ischemia - In setting of above.  Troponin 28, 27  Paroxysmal A-fib - Eliquis   Hyperlipidemia - Crestor    Diabetes mellitus - A1c ordered  - Refusing CBG checks   DVT prophylaxis:  apixaban  (ELIQUIS ) tablet 2.5 mg Start: 06/16/24 2200 apixaban  (ELIQUIS ) tablet 2.5 mg  Code Status: DNR Family Communication: None at bedside Disposition Plan: Home Status is: Observation The patient will require care spanning > 2 midnights and should be moved to inpatient because: Cardiology consult today    Antimicrobials:  Anti-infectives (From admission, onward)    None        Objective: Vitals:   06/17/24 0809 06/17/24 0936 06/17/24 1147 06/17/24 1209  BP: 138/60 138/84 (!) 148/65 (!) 148/89  Pulse: 67 (!) 54 (!) 54 (!) 53  Resp: (!) 24 16 (!) 24 20  Temp: 98.1 F (36.7 C)  97.9 F (36.6 C)   TempSrc: Oral  Oral   SpO2: 97% 97% 98% 100%  Weight:      Height:        Intake/Output Summary (Last 24 hours) at 06/17/2024 1246 Last data filed at 06/17/2024 1148 Gross per 24 hour  Intake 720 ml  Output 350 ml  Net 370 ml   Filed Weights   06/16/24 1411 06/16/24 1835 06/17/24 0555  Weight: 88.9 kg 88.6 kg 90 kg    Examination:  General exam: Appears calm and comfortable  Respiratory system: Diminished breath sounds without wheeze or rhonchi, room air, conversational dyspnea today  Cardiovascular system: S1 & S2 heard, RRR. No murmurs. No pedal edema. Gastrointestinal system:  Abdomen is nondistended, soft  Central nervous system: Alert and oriented. No focal neurological deficits. Speech clear.  Extremities: Symmetric in appearance  Skin: No rashes, lesions or ulcers on exposed skin  Psychiatry: Judgement and insight appear normal. Mood & affect appropriate.   Data Reviewed: I have personally reviewed following labs and imaging studies  CBC: Recent Labs  Lab 06/16/24 1441 06/17/24 0410  WBC 8.7 8.2  NEUTROABS 7.1  --   HGB 11.6* 11.4*  HCT 35.3* 35.5*  MCV 92.2 92.0  PLT 300 317   Basic Metabolic Panel: Recent Labs  Lab 06/16/24 1539 06/16/24 1958 06/17/24 0410  NA 138  --  138  K 2.8*  --  3.9  CL 97*  --  97*  CO2 27  --  22  GLUCOSE 100*  --  227*  BUN 54*  --  55*  CREATININE 1.90*  --  2.76*  CALCIUM  7.0*  --  7.7*  MG  --  1.7  --   PHOS  --  4.0  --    GFR: Estimated Creatinine Clearance: 14.7 mL/min (A) (by C-G formula based on SCr of 2.76 mg/dL (H)). Liver Function Tests: Recent Labs  Lab 06/16/24 1539  AST 17  ALT 10  ALKPHOS 71  BILITOT 0.7  PROT 6.2*  ALBUMIN 2.8*   No results for input(s): LIPASE, AMYLASE in the last 168 hours. No results for input(s): AMMONIA in the last 168 hours. Coagulation Profile: No results for input(s): INR, PROTIME in the last 168 hours. Cardiac Enzymes: No results for input(s): CKTOTAL, CKMB, CKMBINDEX, TROPONINI in the last 168 hours. BNP (last 3 results) No results for input(s): PROBNP in the last 8760 hours. HbA1C: No results for input(s): HGBA1C in the last 72 hours. CBG: No results for input(s): GLUCAP in the last 168 hours. Lipid Profile: No results for input(s): CHOL, HDL, LDLCALC, TRIG, CHOLHDL, LDLDIRECT in the last 72 hours. Thyroid  Function Tests: No results for input(s): TSH, T4TOTAL, FREET4, T3FREE, THYROIDAB in the last 72 hours. Anemia Panel: No results for input(s): VITAMINB12, FOLATE, FERRITIN, TIBC, IRON ,  RETICCTPCT in the last 72 hours. Sepsis Labs: No results for input(s): PROCALCITON, LATICACIDVEN in the last 168 hours.  Recent Results (from the past 240 hours)  Resp panel by RT-PCR (RSV, Flu A&B, Covid) Anterior Nasal Swab     Status: None   Collection Time: 06/16/24  4:35 PM   Specimen: Anterior Nasal Swab  Result Value Ref Range Status   SARS Coronavirus 2 by RT PCR NEGATIVE NEGATIVE Final   Influenza A by PCR NEGATIVE NEGATIVE Final   Influenza B by PCR NEGATIVE NEGATIVE Final    Comment: (NOTE) The Xpert Xpress SARS-CoV-2/FLU/RSV plus assay is intended as an aid in the diagnosis of influenza from Nasopharyngeal swab specimens and should not be used as a sole basis for treatment. Nasal washings and aspirates are unacceptable for Xpert Xpress SARS-CoV-2/FLU/RSV testing.  Fact Sheet for Patients: BloggerCourse.com  Fact Sheet for Healthcare Providers: SeriousBroker.it  This test is not yet approved or cleared by the [place]  FDA and has been  authorized for detection and/or diagnosis of SARS-CoV-2 by FDA under an Emergency Use Authorization (EUA). This EUA will remain in effect (meaning this test can be used) for the duration of the COVID-19 declaration under Section 564(b)(1) of the Act, 21 U.S.C. section 360bbb-3(b)(1), unless the authorization is terminated or revoked.     Resp Syncytial Virus by PCR NEGATIVE NEGATIVE Final    Comment: (NOTE) Fact Sheet for Patients: BloggerCourse.com  Fact Sheet for Healthcare Providers: SeriousBroker.it  This test is not yet approved or cleared by the [place]  FDA and has been authorized for detection and/or diagnosis of SARS-CoV-2 by FDA under an Emergency Use Authorization (EUA). This EUA will remain in effect (meaning this test can be used) for the duration of the COVID-19 declaration under Section 564(b)(1) of the  Act, 21 U.S.C. section 360bbb-3(b)(1), unless the authorization is terminated or revoked.  Performed at [facility], [address] [place], [place] [address]       Radiology Studies: DG Finger Index Left Result Date: 06/16/2024 CLINICAL DATA:  Left finger swelling. EXAM: LEFT INDEX FINGER 2+V COMPARISON:  None Available. FINDINGS: There is no evidence of fracture or dislocation. There is no evidence of arthropathy or other focal bone abnormality. Mild diffuse soft tissue swelling is noted. IMPRESSION: Mild diffuse soft tissue swelling.  No fracture or dislocation. Electronically Signed   By: Lynwood Landy Raddle M.D.   On: 06/16/2024 15:43   DG Hand Complete Right Result Date: 06/16/2024 CLINICAL DATA:  Right hand pain. EXAM: RIGHT HAND - COMPLETE 3+ VIEW COMPARISON:  None Available. FINDINGS: There is no evidence of fracture or dislocation. Severe degenerative changes seen involving first carpometacarpal joint. Soft tissues are unremarkable. IMPRESSION: Severe osteoarthritis of first carpometacarpal joint. No acute abnormality seen. Electronically Signed   By: Lynwood Landy Raddle M.D.   On: 06/16/2024 15:41   DG Chest 1 View Result Date: 06/16/2024 CLINICAL DATA:  Shortness of breath. EXAM: CHEST  1 VIEW COMPARISON:  September 26, 2023. FINDINGS: Mild cardiomegaly is noted. No acute pulmonary disease is noted. Bony thorax is unremarkable. IMPRESSION: No active disease. Aortic Atherosclerosis (ICD10-I70.0). Electronically Signed   By: Lynwood Landy Raddle M.D.   On: 06/16/2024 15:40      Scheduled Meds:  apixaban   2.5 mg Oral BID   arformoterol   15 mcg Nebulization BID   And   umeclidinium bromide   1 puff Inhalation Daily   dorzolamide   1 drop Both Eyes BID   gabapentin   100 mg Oral q morning   gabapentin   200 mg Oral QHS   ipratropium-albuterol   3 mL Nebulization QID   latanoprost   1 drop Both Eyes q AM   methylPREDNISolone  (SOLU-MEDROL ) injection  125 mg Intravenous Daily   pantoprazole   40  mg Oral Daily   rosuvastatin   20 mg Oral Daily   Continuous Infusions:   LOS: 0 days   Time spent: 40 minutes   Delon Hoe, DO [facility] 06/17/2024, 12:46 PM   Available via Epic secure chat 7am-7pm After these hours, please refer to coverage provider listed on [email or web address]

## 2024-06-17 NOTE — Care Management Obs Status (Signed)
 MEDICARE OBSERVATION STATUS NOTIFICATION   Patient Details  Name: Jenny [name] MRN: [id number] Date of Birth: 19-Jul-1933   Medicare Observation Status Notification Given:  Yes    Jon Cruel 06/17/2024, 12:57 PM

## 2024-06-17 NOTE — Consult Note (Signed)
 Cardiology Consultation  Patient ID: AMMA CREAR MRN: [id number]; DOB: 12-Aug-1933  Admit date: 06/16/2024 Date of Consult: 06/17/2024  PCP:  [facility]   [facility] Providers Cardiologist:  Lonni Cash, MD     Patient Profile: Jenny [name] is a 89 y.o. female with a hx of CAD s/p PCI in 2010, HTN, HLD, COPD, tobacco abuse, Mobitz 1 heart block, CKD, chronic HFpEF, permanent atrial fibrillation, mild bilateral carotid artery disease, and type 2 diabetes who is being seen 06/17/2024 for the evaluation of acute on chronic HFpEF at the request of Dr. Rojelio.  History of Present Illness: Jenny [name] has past medical history as listed above. She presented to [facility] ED on 06/16/2024 for shortness of breath. She reported having worsening shortness of breath with exertion over the last few months. She follows closely with nephrology who recommended an increase in her PO Torsemide . She denied any improvement with the increase in dosage.   Relevant workup in the ED/since admission: BNP 227, troponin 28 > 27, BMP showed creatinine 1.9 > 2.76 (baseline ~1.4), CXR showed mild cardiomegaly, no acute disease  She sees Dr. Cash as an outpatient, last seen 01/2024 for follow up.  He noted that she had residual moderate LAD stenosis and an EF of 60%, she was hospitalized in September 2022 with a COPD exacerbation along with acute on chronic HFpEF.  She was found to be in atrial fibrillation during this admission, was started on Eliquis  and diuresed with IV Lasix .  She been followed with our advanced heart failure clinic at [facility] and has been on aspirin  and Plavix  both been stopped.  Long-term monitor from October 2022 showed 100% A-fib burden.  She has been seen in our atrial fibrillation clinic.  She was admitted to [facility] November 2022 with a COPD and CHF exacerbation.  Throughout these multiple admission she was found to be bradycardic, beta-blockers were stopped.   At some point she was noted to have a transient 2:1 AV block.  She was then seen by Dr. Wendel noting dyspnea, fatigue, reported heart rate 40s.  EKG at that time showed a Mobitz 1 AV block.  She was referred to our EP clinic, all providers came to an agreement that an ischemic evaluation should be pursued before permanent pacing was considered.  She was seen multiple times, refused to consider cardiac cath, even though she was reporting ongoing dyspnea and fatigue.  She was previously taking Entresto  in 2023 but she stopped and does not wish to restart this medication.  She was hospitalized again in October 2024 with increased dyspnea and was given IV Lasix .  She was also bradycardic at that time and seen by EP but there are no indications for pacemaker at that time.  At some point she reported having black stools, hemoglobin of 9.3, the consideration for an EGD/colonoscopy to ensure there is no GI bleeding, she refused.  Follow-up hemoglobin was 12.3, she resumed her Eliquis .  At the time of this last visit on 01/2024 she denied any chest pain, palpitations, LE edema, orthopnea, dizziness, syncope.  She was admitting that she was having her baseline dyspnea.  Her treatment plan at the time of this visit included: Crestor  for hyperlipidemia, Eliquis  for anticoagulation with A-fib, torsemide  was increased to 40 mg in the morning 20 mg in the evening for 3 days and then back to baseline dose of 20 mg twice a day.  Her blood pressure at this appointment was  162/70, she was going to buy a blood pressure cuff and start following it at home, she was not on any current medical therapy.  She is to continue avoiding AV nodal blocking agents due to her history of bradycardia, AV block.  After speaking with the patient, she tells me that she has been feeling progressively more short of breath for a few months now. She has been talking to her PCP and nephrologist who have recommended increasing Torsemide  doses as well as  utilizing PRN metolazone . She reports that her breathing is much worse than her baseline, even with her COPD. She says she normally can do ADLs but she isn't even able to do her dishes without getting out of breath. She reports a 10 lb weight gain within the past week. She is wanting to get fluid off of her while she is admitted, but understands the limitations with her renal function. Of note, she does report some improvement in her symptoms since being here when she has only received breathing treatments/inhalers for her COPD. She has not received any diuresis. Her main complaint continues to be the bloating of her abdomen.  Past Medical History:  Diagnosis Date   ACUT DUOD ULCER W/HEMORR&PERF W/O MENTION OBST 10/05/2009   NSAID induced   ALLERGIC RHINITIS CAUSE UNSPECIFIED    ANEMIA-NOS    CAD (coronary artery disease) 06/08/2009   DEs RCA with 70% LAD and EF 60%   CHF (congestive heart failure) (HCC)    COPD    mild obst on PFTs 03/2010   Diabetes mellitus 06/2010 dx   Mild, diet controlled   GLAUCOMA    HYPERLIPIDEMIA    HYPERTENSION, BENIGN    MYOCARDIAL INFARCTION 06/08/2009   des to rca   Persistent atrial fibrillation (HCC)    Dx 08/2021   TOBACCO ABUSE    Past Surgical History:  Procedure Laterality Date   CARDIOVERSION N/A 01/30/2022   Procedure: CARDIOVERSION;  Surgeon: Alveta Aleene PARAS, MD;  Location: [facility] ENDOSCOPY;  Service: Cardiovascular;  Laterality: N/A;   HEMORRHOID SURGERY  1990   Right knee surgery      Home Medications:  Prior to Admission medications   Medication Sig Start Date End Date Taking? Authorizing Provider  acetaminophen  (TYLENOL ) 500 MG tablet Take 500 mg by mouth every 6 (six) hours as needed. Takes two tablets in the morning and two tablets at night   Yes [provider]  albuterol  (VENTOLIN  HFA) 108 (90 Base) MCG/ACT inhaler INHALE ONE PUFF EVERY 6 HOURS AS NEEDED FOR SHORTNESS OF BREATH 11/03/21  Yes Claudene Arthea SQUIBB, MD  apixaban  (ELIQUIS )  2.5 MG TABS tablet Take 1 tablet (2.5 mg total) by mouth 2 (two) times daily. 11/10/23  Yes Weaver, Scott T, PA-C  dorzolamide  (TRUSOPT ) 2 % ophthalmic solution Place 1 drop into both eyes 2 (two) times daily.   Yes [provider]  gabapentin  (NEURONTIN ) 100 MG capsule Take 100-200 mg by mouth See admin instructions. Take 1 capsule (100mg ) by mouth every morning and take 2 capsules (200mg ) by mouth at bedtime   Yes [provider]  hydrOXYzine (ATARAX) 25 MG tablet Take 25 mg by mouth every 8 (eight) hours as needed for itching. 06/06/22  Yes [provider]  ipratropium-albuterol  (DUONEB) 0.5-2.5 (3) MG/3ML SOLN Take 3 mLs by nebulization every 6 (six) hours as needed. 11/06/21  Yes Patel, Sona, MD  latanoprost  (XALATAN ) 0.005 % ophthalmic solution Place 1 drop into both eyes in the morning.   Yes [provider]  metolazone  (ZAROXOLYN ) 2.5 MG tablet Take 1 tablet (2.5 mg total) by mouth as needed. Patient taking differently: Take 2.5 mg by mouth as needed (edema). 02/19/24 06/16/24 Yes Hackney, Ellouise LABOR, FNP  NITROSTAT  0.4 MG SL tablet DISSOLVE ONE TABLET UNDER TONGUE AS NEEDED FOR CHEST PAIN - MAY REPEAT TWICE-IF NO RELIEF GO TO NEAREST HOSPITAL ER 05/06/13  Yes Inocencio Berwyn LABOR, MD  pantoprazole  (PROTONIX ) 40 MG tablet Take 1 tablet (40 mg total) by mouth daily. 10/14/23  Yes Weaver, Scott T, PA-C  rosuvastatin  (CRESTOR ) 20 MG tablet Take 20 mg by mouth daily.   Yes [provider]  Tiotropium Bromide-Olodaterol (STIOLTO RESPIMAT) 2.5-2.5 MCG/ACT AERS 2 puffs Inhalation Once a day for 30 days   Yes [provider]  torsemide  (DEMADEX ) 20 MG tablet Take 2 tablets by mouth in the a.m and 1 tablet by mouth in the p.m for 2 days then go back to 1 tablet by mouth twice a day Patient taking differently: Take 20 mg by mouth 2 (two) times daily. 10/14/23  Yes Weaver, Scott T, PA-C    Scheduled Meds:  apixaban   2.5 mg Oral BID   arformoterol   15 mcg  Nebulization BID   And   umeclidinium bromide   1 puff Inhalation Daily   dorzolamide   1 drop Both Eyes BID   gabapentin   100 mg Oral q morning   gabapentin   200 mg Oral QHS   ipratropium-albuterol   3 mL Nebulization QID   latanoprost   1 drop Both Eyes q AM   methylPREDNISolone  (SOLU-MEDROL ) injection  125 mg Intravenous Daily   pantoprazole   40 mg Oral Daily   rosuvastatin   20 mg Oral Daily   Continuous Infusions:  PRN Meds: acetaminophen  **OR** acetaminophen , albuterol , hydrALAZINE , nitroGLYCERIN , mouth rinse  Allergies:    Allergies  Allergen Reactions   Aspirin  Other (See Comments)    Can take 81 mg not 325 mg -bleeding   Entresto  [Sacubitril -Valsartan ] Shortness Of Breath    COPD and reports increased SOB with Entresto    Atorvastatin  Itching    Itching and myaliga  Other Reaction(s): itching, rash, Other (See Comments)   Cyclobenzaprine     Other reaction(s): muscle relaxers-ulcer hemorrhage (hospitalized)  Other Reaction(s): Other (See Comments), ulcer hemorrhage   Lactose Intolerance (Gi) Other (See Comments)    GI upset   Meperidine Hcl Other (See Comments)    Not known   Pravastatin  Rash    rash  Other Reaction(s): Other (See Comments)   Propoxyphene     Other reaction(s): pain meds, hallucination, vomiting  Other Reaction(s): Hallucination, hallucinations, vomiting, Other (See Comments), Unknown   Wound Dressing Adhesive     Other reaction(s): red, stings   Social History:   Social History   Socioeconomic History   Marital status: Widowed    Spouse name: Not on file   Number of children: Not on file   Years of education: Not on file   Highest education level: Not on file  Occupational History   Not on file  Tobacco Use   Smoking status: Former    Current packs/day: 0.00    Average packs/day: 0.5 packs/day for 60.0 years (30.0 ttl pk-yrs)    Types: Cigarettes    Start date: 11/12/1961    Quit date: 11/12/2021    Years since quitting: 2.5    Smokeless tobacco: Never   Tobacco comments:    She lives in [place] with her significant other ([employer]  Vaping Use   Vaping  status: Never Used  Substance and Sexual Activity   Alcohol use: Yes    Alcohol/week: 1.0 - 2.0 standard drink of alcohol    Types: 1 - 2 Glasses of wine per week    Comment: once or twice a year glass of wine 12/25/21   Drug use: No   Sexual activity: Not on file  Other Topics Concern   Not on file  Social History Narrative   Lives in [place] with her SO - charles hook   Retired -Acupuncturist   Enjoys travel - live Engineer, maintenance (IT)   Social Drivers of Corporate investment banker Strain: Not on file  Food Insecurity: No Food Insecurity (06/16/2024)   Hunger Vital Sign    Worried About Running Out of Food in the Last Year: Never true    Ran Out of Food in the Last Year: Never true  Transportation Needs: No Transportation Needs (06/16/2024)   PRAPARE - Administrator, Civil Service (Medical): No    Lack of Transportation (Non-Medical): No  Physical Activity: Not on file  Stress: Not on file  Social Connections: Moderately Isolated (06/16/2024)   Social Connection and Isolation Panel    Frequency of Communication with Friends and Family: More than three times a week    Frequency of Social Gatherings with Friends and Family: Three times a week    Attends Religious Services: 1 to 4 times per year    Active Member of Clubs or Organizations: No    Attends Banker Meetings: Never    Marital Status: Widowed  Intimate Partner Violence: Not At Risk (06/16/2024)   Humiliation, Afraid, Rape, and Kick questionnaire    Fear of Current or Ex-Partner: No    Emotionally Abused: No    Physically Abused: No    Sexually Abused: No    Family History:   Family History  Problem Relation Age of Onset   Hypertension Mother    Ulcers Mother    Stomach cancer Father    Stroke Maternal Grandmother    Heart attack Neg Hx      ROS:   Please see the history of present illness.  All other ROS reviewed and negative.     Physical Exam/Data: Vitals:   06/17/24 0809 06/17/24 0936 06/17/24 1147 06/17/24 1209  BP: 138/60 138/84 (!) 148/65 (!) 148/89  Pulse: 67 (!) 54 (!) 54 (!) 53  Resp: (!) 24 16 (!) 24 20  Temp: 98.1 F (36.7 C)  97.9 F (36.6 C)   TempSrc: Oral  Oral   SpO2: 97% 97% 98% 100%  Weight:      Height:        Intake/Output Summary (Last 24 hours) at 06/17/2024 1437 Last data filed at 06/17/2024 1148 Gross per 24 hour  Intake 720 ml  Output 350 ml  Net 370 ml      06/17/2024    5:55 AM 06/16/2024    6:35 PM 06/16/2024    2:11 PM  Last 3 Weights  Weight (lbs) 198 lb 6.4 oz 195 lb 5.2 oz 196 lb  Weight (kg) 89.994 kg 88.6 kg 88.905 kg     Body mass index is 36.29 kg/m.   General:  in no acute distress, on room air, able to speak in full sentences without issue  HEENT: normal Neck: no JVD Vascular: Distal pulses 2+ bilaterally Cardiac:  normal S1, S2; RRR; no murmur  Lungs:  clear to auscultation bilaterally, no wheezing, rhonchi or rales  Abd: soft, nontender, no hepatomegaly  Ext: no edema Musculoskeletal:  No deformities, BUE and BLE strength normal and equal Skin: warm and dry  Neuro:  no focal abnormalities noted Psych:  Normal affect   EKG:  The EKG was personally reviewed and demonstrates:  atrial fibrillation, HR 51, baseline artifact   Telemetry:  Telemetry was personally reviewed and demonstrates:  atrial fibrillation, HR 50-60s  Relevant CV Studies: Echocardiogram, 06/17/2024 Ordered, pending results   Laboratory Data: High Sensitivity Troponin:   Recent Labs  Lab 06/16/24 1441 06/16/24 1958  TROPONINIHS 28* 27*     Chemistry Recent Labs  Lab 06/16/24 1539 06/16/24 1958 06/17/24 0410  NA 138  --  138  K 2.8*  --  3.9  CL 97*  --  97*  CO2 27  --  22  GLUCOSE 100*  --  227*  BUN 54*  --  55*  CREATININE 1.90*  --  2.76*  CALCIUM  7.0*  --  7.7*  MG  --  1.7  --    GFRNONAA 25*  --  16*  ANIONGAP 14  --  19*    Recent Labs  Lab 06/16/24 1539  PROT 6.2*  ALBUMIN 2.8*  AST 17  ALT 10  ALKPHOS 71  BILITOT 0.7   Lipids No results for input(s): CHOL, TRIG, HDL, LABVLDL, LDLCALC, CHOLHDL in the last 168 hours.  Hematology Recent Labs  Lab 06/16/24 1441 06/17/24 0410  WBC 8.7 8.2  RBC 3.83* 3.86*  HGB 11.6* 11.4*  HCT 35.3* 35.5*  MCV 92.2 92.0  MCH 30.3 29.5  MCHC 32.9 32.1  RDW 15.3 15.0  PLT 300 317   Thyroid  No results for input(s): TSH, FREET4 in the last 168 hours.  BNP Recent Labs  Lab 06/16/24 1441  BNP 227.9*    DDimer No results for input(s): DDIMER in the last 168 hours.  Radiology/Studies:  DG Finger Index Left Result Date: 06/16/2024 CLINICAL DATA:  Left finger swelling. EXAM: LEFT INDEX FINGER 2+V COMPARISON:  None Available. FINDINGS: There is no evidence of fracture or dislocation. There is no evidence of arthropathy or other focal bone abnormality. Mild diffuse soft tissue swelling is noted. IMPRESSION: Mild diffuse soft tissue swelling.  No fracture or dislocation. Electronically Signed   By: Lynwood Landy Raddle M.D.   On: 06/16/2024 15:43   DG Hand Complete Right Result Date: 06/16/2024 CLINICAL DATA:  Right hand pain. EXAM: RIGHT HAND - COMPLETE 3+ VIEW COMPARISON:  None Available. FINDINGS: There is no evidence of fracture or dislocation. Severe degenerative changes seen involving first carpometacarpal joint. Soft tissues are unremarkable. IMPRESSION: Severe osteoarthritis of first carpometacarpal joint. No acute abnormality seen. Electronically Signed   By: Lynwood Landy Raddle M.D.   On: 06/16/2024 15:41   DG Chest 1 View Result Date: 06/16/2024 CLINICAL DATA:  Shortness of breath. EXAM: CHEST  1 VIEW COMPARISON:  September 26, 2023. FINDINGS: Mild cardiomegaly is noted. No acute pulmonary disease is noted. Bony thorax is unremarkable. IMPRESSION: No active disease. Aortic Atherosclerosis (ICD10-I70.0).  Electronically Signed   By: Lynwood Landy Raddle M.D.   On: 06/16/2024 15:40   Assessment and Plan:  Acute on chronic HFpEF Known history of HFpEF  Presented with progressive DOE over last few months  Echo from 04/2023 showed EF 65-70%, G2DD, normal RV function, severely elevated PASP, BAE, mild to moderate TR, dilated IVC BNP 227 CXR showed cardiomegaly  Home meds: metolazone  2.5 mg PRN, Torsemide  20 mg BID Baseline creatinine 1.4,  came in 1.9 > 2.76 -- rechecking renal function  Ordered updated echocardiogram to determine volume status -- likely that patient could be dry and dyspnea might be related to her lung disease  Currently remain holding diuretics with worsening renal function   Permanent atrial fibrillation History of Mobitz type I Long term monitor showed 100% A. Fib burden Avoid AV nodal blockers Home meds: Eliquis  2.5 mg BID  Most recent HR 57, remains in A. Fib  Patient denies any symptoms with her atrial fibrillation Continue Eliquis  2.5 mg BID (appropriate given age, renal function)  Per primary AKI on CKD stage 3b COPD Hyperlipidemia  Diabetes   Risk Assessment/Risk Scores:     New [place]  Heart Association (NYHA) Functional Class NYHA Class III  CHA2DS2-VASc Score = 7   This indicates a 11.2% annual risk of stroke. The patient's score is based upon: CHF History: 1 HTN History: 1 Diabetes History: 1 Stroke History: 0 Vascular Disease History: 1 Age Score: 2 Gender Score: 1       For questions or updates, please contact [facility] Please consult [email or web address] for contact info under    Signed, Waddell DELENA Donath, PA-C  06/17/2024 2:37 PM

## 2024-06-18 ENCOUNTER — Other Ambulatory Visit (HOSPITAL_COMMUNITY): Payer: Self-pay

## 2024-06-18 ENCOUNTER — Inpatient Hospital Stay (HOSPITAL_COMMUNITY)

## 2024-06-18 DIAGNOSIS — R0609 Other forms of dyspnea: Secondary | ICD-10-CM

## 2024-06-18 DIAGNOSIS — J441 Chronic obstructive pulmonary disease with (acute) exacerbation: Secondary | ICD-10-CM | POA: Diagnosis not present

## 2024-06-18 DIAGNOSIS — I272 Pulmonary hypertension, unspecified: Secondary | ICD-10-CM | POA: Diagnosis not present

## 2024-06-18 DIAGNOSIS — I5032 Chronic diastolic (congestive) heart failure: Secondary | ICD-10-CM | POA: Diagnosis not present

## 2024-06-18 DIAGNOSIS — I4821 Permanent atrial fibrillation: Secondary | ICD-10-CM | POA: Diagnosis not present

## 2024-06-18 LAB — BASIC METABOLIC PANEL WITH GFR
Anion gap: 14 (ref 5–15)
BUN: 61 mg/dL — ABNORMAL HIGH (ref 8–23)
CO2: 24 mmol/L (ref 22–32)
Calcium: 8.2 mg/dL — ABNORMAL LOW (ref 8.9–10.3)
Chloride: 98 mmol/L (ref 98–111)
Creatinine, Ser: 2.38 mg/dL — ABNORMAL HIGH (ref 0.44–1.00)
GFR, Estimated: 19 mL/min — ABNORMAL LOW (ref >60)
Glucose, Bld: 188 mg/dL — ABNORMAL HIGH (ref 70–99)
Potassium: 3.5 mmol/L (ref 3.5–5.1)
Sodium: 136 mmol/L (ref 135–145)

## 2024-06-18 LAB — ECHOCARDIOGRAM COMPLETE
AR max vel: 2.83 cm2
AV Area VTI: 2.56 cm2
AV Area mean vel: 2.72 cm2
AV Mean grad: 5 mmHg
AV Peak grad: 10.6 mmHg
Ao pk vel: 1.63 m/s
Area-P 1/2: 2.41 cm2
Height: 62 in
S' Lateral: 2.8 cm
Weight: 3174.4 [oz_av]

## 2024-06-18 LAB — HEMOGLOBIN A1C
Hgb A1c MFr Bld: 6.6 % — ABNORMAL HIGH (ref 4.8–5.6)
Mean Plasma Glucose: 143 mg/dL

## 2024-06-18 MED ORDER — METHYLPREDNISOLONE SODIUM SUCC 40 MG IJ SOLR
40.0000 mg | Freq: Every day | INTRAMUSCULAR | Status: DC
  Administered 2024-06-19: 40 mg via INTRAVENOUS
  Filled 2024-06-18: qty 1

## 2024-06-18 MED ORDER — POTASSIUM CHLORIDE CRYS ER 20 MEQ PO TBCR
20.0000 meq | EXTENDED_RELEASE_TABLET | Freq: Once | ORAL | Status: AC
  Administered 2024-06-18: 20 meq via ORAL
  Filled 2024-06-18: qty 1

## 2024-06-18 NOTE — Progress Notes (Signed)
 Physical Therapy Treatment Patient Details Name: Jenny [name] MRN: [id number] DOB: 1933/01/22 Today's Date: 06/18/2024   History of Present Illness Pt is a 89 yr old female who presented 06/16/24 from home due to SOB. She also reports a fall causing edema in L index finger and R ahnd. PMH: HFpEF, CKD stage 3b, HTN, T2DM, COPD, Afib, CAD    PT Comments  Pt not quite mod I level, but much improved.  Emphasis on safety with STS, gait with the Rollator, safety with the brakes and monitoring of sats.  Pt will benefit from family to assist as needed up front and relax assist as able.     If plan is discharge home, recommend the following: A little help with walking and/or transfers;A little help with bathing/dressing/bathroom;Assistance with cooking/housework;Assist for transportation;Help with stairs or ramp for entrance   Can travel by private vehicle        Equipment Recommendations  None recommended by PT    Recommendations for Other Services       Precautions / Restrictions Precautions Precautions: Fall Recall of Precautions/Restrictions: Intact     Mobility  Bed Mobility               General bed mobility comments: presented in chair    Transfers Overall transfer level: Needs assistance Equipment used: Rollator (4 wheels) Transfers: Sit to/from Stand Sit to Stand: Supervision           General transfer comment: All safe except pt unlocking brakes before turning around to face the rollator.    Ambulation/Gait Ambulation/Gait assistance: Supervision Gait Distance (Feet): 50 Feet (x2 and 100 feet.) Assistive device: Rollator (4 wheels) Gait Pattern/deviations: Step-through pattern   Gait velocity interpretation: 1.31 - 2.62 ft/sec, indicative of limited community ambulator   General Gait Details: steady with use of the rollator, age appropriate cadence,   Stairs             Wheelchair Mobility     Tilt Bed    Modified Rankin (Stroke Patients  Only)       Balance Overall balance assessment: Needs assistance   Sitting balance-Leahy Scale: Good     Standing balance support: No upper extremity supported Standing balance-Leahy Scale: Fair                              Hotel manager: No apparent difficulties  Cognition Arousal: Alert Behavior During Therapy: WFL for tasks assessed/performed   PT - Cognitive impairments: No apparent impairments                         Following commands: Intact      Cueing Cueing Techniques: Verbal cues  Exercises      General Comments General comments (skin integrity, edema, etc.): SpO2 on RA 95% and HR 70's. min/ Moderate dyspnea 2/4      Pertinent Vitals/Pain Pain Assessment Pain Assessment: Faces Pain Score: 2  Faces Pain Scale: Hurts a little bit Pain Location: knee Pain Descriptors / Indicators: Discomfort Pain Intervention(s): Monitored during session    Home Living                          Prior Function            PT Goals (current goals can now be found in the care plan section) Acute Rehab PT Goals Patient Stated Goal:  Get well go home, breath better PT Goal Formulation: With patient Time For Goal Achievement: 07/01/24 Potential to Achieve Goals: Good Progress towards PT goals: Progressing toward goals    Frequency    Min 2X/week      PT Plan      Co-evaluation              AM-PAC PT 6 Clicks Mobility   Outcome Measure  Help needed turning from your back to your side while in a flat bed without using bedrails?: None Help needed moving from lying on your back to sitting on the side of a flat bed without using bedrails?: None Help needed moving to and from a bed to a chair (including a wheelchair)?: A Little Help needed standing up from a chair using your arms (e.g., wheelchair or bedside chair)?: A Little Help needed to walk in hospital room?: A Little Help needed climbing  3-5 steps with a railing? : A Little 6 Click Score: 20    End of Session     Patient left: in chair;with call bell/phone within reach;with chair alarm set;with nursing/sitter in room Nurse Communication: Mobility status PT Visit Diagnosis: Other abnormalities of gait and mobility (R26.89)     Time: 1548-1610 PT Time Calculation (min) (ACUTE ONLY): 22 min  Charges:    $Gait Training: 8-22 mins PT General Charges $$ ACUTE PT VISIT: 1 Visit                     06/18/2024  Jenny [name] HERO., PT Acute Rehabilitation Services [phone]  (office)   Jenny [name] Jenny [name] Jenny [name] 06/18/2024, 4:20 PM

## 2024-06-18 NOTE — Progress Notes (Signed)
 Occupational Therapy Treatment Patient Details Name: Jenny [name] MRN: [id number] DOB: 25-Aug-1933 Today's Date: 06/18/2024   History of present illness Pt is a 89 yr old female who presented 06/16/24 from home due to SOB. She also reports a fall causing edema in L index finger and R ahnd. PMH: HFpEF, CKD stage 3b, HTN, T2DM, COPD, Afib, CAD   OT comments  Pt progressing well towards goals. Pt received in recliner reporting fatigue from recent PT session, but agreeable to education session. Provided and reviewed energy conservation handout for ADLs. Pt able to return demo and verbalize ways to incorporate into routine. Pt reporting low vision worsening vision at baseline. OT educated pt on safety strategies in home to reduce fall risk with low vision. Pt continues to be limited by decreased activity tolerance. Continue to recommend HHOT to optimize independence levels. Will continue to follow acutely.       If plan is discharge home, recommend the following:  A little help with walking and/or transfers;A little help with bathing/dressing/bathroom;Assistance with cooking/housework;Help with stairs or ramp for entrance   Equipment Recommendations  None recommended by OT       Precautions / Restrictions Precautions Precautions: Fall Recall of Precautions/Restrictions: Intact Restrictions Weight Bearing Restrictions Per Provider Order: No       Mobility Bed Mobility     General bed mobility comments: presented in chair    Transfers Overall transfer level: Needs assistance         Balance Overall balance assessment: Needs assistance Sitting-balance support: No upper extremity supported, Feet supported Sitting balance-Leahy Scale: Good     ADL either performed or assessed with clinical judgement   ADL Overall ADL's : Needs assistance/impaired     General ADL Comments: Reviewed Energy conservation handout and low vision compensatory strategies for home safety     Extremity/Trunk Assessment Upper Extremity Assessment Upper Extremity Assessment: Overall WFL for tasks assessed   Lower Extremity Assessment Lower Extremity Assessment: Defer to PT evaluation                 Communication Communication Communication: No apparent difficulties   Cognition Arousal: Alert Behavior During Therapy: WFL for tasks assessed/performed Cognition: No apparent impairments   Following commands: Intact        Cueing   Cueing Techniques: Verbal cues        General Comments Access Code: [email or web address]  URL: [email or web address]  Date: 06/18/2024  Prepared by: Adrianne Savers    Patient Education  - Understanding Energy Conservation  - Energy Conservation During Daily Tasks    Pertinent Vitals/ Pain       Pain Assessment Pain Assessment: No/denies pain Pain Intervention(s): Monitored during session   Frequency  Min 2X/week        Progress Toward Goals  OT Goals(current goals can now be found in the care plan section)  Progress towards OT goals: Progressing toward goals  Acute Rehab OT Goals Patient Stated Goal: To go home OT Goal Formulation: With patient Time For Goal Achievement: 07/01/24 Potential to Achieve Goals: Good ADL Goals Pt Will Perform Grooming: with modified independence;sitting;standing Pt Will Perform Upper Body Bathing: Independently;sitting Pt Will Perform Lower Body Bathing: with modified independence;sit to/from stand;sitting/lateral leans Pt Will Transfer to Toilet: with modified independence;ambulating  Plan         AM-PAC OT 6 Clicks Daily Activity     Outcome Measure   Help from another person eating meals?: None Help from another person taking care of personal grooming?:  A Little Help from another person toileting, which includes using toliet, bedpan, or urinal?: A Little Help from another person bathing (including washing, rinsing, drying)?: A Little Help from another person to put on and  taking off regular upper body clothing?: A Little Help from another person to put on and taking off regular lower body clothing?: A Little 6 Click Score: 19    End of Session    OT Visit Diagnosis: Unsteadiness on feet (R26.81);Other abnormalities of gait and mobility (R26.89);Muscle weakness (generalized) (M62.81);History of falling (Z91.81)   Activity Tolerance Patient tolerated treatment well   Patient Left in chair;with call bell/phone within reach   Nurse Communication Mobility status        Time: 8383-8366 OT Time Calculation (min): 17 min  Charges: OT General Charges $OT Visit: 1 Visit OT Treatments $Self Care/Home Management : 8-22 mins  Adrianne BROCKS, OT  Acute Rehabilitation Services Office [phone] Secure chat preferred   Adrianne GORMAN Savers 06/18/2024, 4:42 PM

## 2024-06-18 NOTE — Progress Notes (Signed)
 Mobility Specialist Progress Note;   06/18/24 0937  Mobility  Activity Ambulated with assistance in hallway  Level of Assistance Standby assist, set-up cues, supervision of patient - no hands on  Assistive Device Four wheel walker  Distance Ambulated (ft) 50 ft  Activity Response Tolerated well  Mobility Referral Yes  Mobility visit 1 Mobility  Mobility Specialist Start Time (ACUTE ONLY) [id number]  Mobility Specialist Stop Time (ACUTE ONLY) 0950  Mobility Specialist Time Calculation (min) (ACUTE ONLY) 13 min   Pt agreeable to mobility. Required no physical assistance during ambulation, SV. Required 1x seated rest break d/t fatigue. Dyspnea displayed, pt performed PLB throughout. SPO2 93%> throughout ambulation. HR up to 67 bpm. Pt returned back to sitting on EoB deferred sitting in chair at time. Left with all needs met, call bell in reach.   Lauraine Erm Mobility Specialist Please contact via SecureChat or [employer] [phone]

## 2024-06-18 NOTE — Progress Notes (Signed)
 PROGRESS NOTE    Jenny [name]  FMW:[id number] DOB: 09-Nov-1933 DOA: 06/16/2024 PCP: Ileen Rosaline NOVAK, NP     Brief Narrative:  Jenny [name]  is a 89 y.o. female, with medical history significant of HFpEF, atrial fibrillation on Eliquis , COPD, hypertension, tachybradycardia syndrome, hyperlipidemia, CAD, presents to the emergency department with worsening shortness of breath.  Patient reports ongoing dyspnea, progressive over the last few months to few weeks, but much worsened recently, she was seen by nephrology recently, where she was instructed to increase her oral diuretic, reports not much improvement of her symptoms.  In ED she had low-grade temperature 99.1, she had no hypoxia, but had significantly diminished air entry bilaterally, chest x-ray with no evidence of volume overload, her BNP was elevated at 227, but this is less than her baseline most recently at 441, high-sensitivity troponins elevated at 28, labs were significant for hypokalemia 2.8, and creatinine elevated at 1.9 from baseline of 1.5, patient received IV steroids, nebulizer treatment with improvement of her symptoms, [facility] consulted to admit.  New events last 24 hours / Subjective: Still significant shortness of breath with ambulation.  Remains on room air.   Assessment & Plan:   Principal Problem:   COPD exacerbation (HCC) Active Problems:   Tachycardia-bradycardia syndrome (HCC)   Essential hypertension   Stage 3b chronic kidney disease (HCC)   COPD (chronic obstructive pulmonary disease) (HCC)   Obese   Persistent atrial fibrillation (HCC)   Heart failure (HCC)   Type II diabetes mellitus with peripheral circulatory disorder (HCC)   SOB (shortness of breath)   Dyspnea on exertion, multifactorial with COPD exacerbation and diastolic CHF - COVID/influenza/RSV negative - Neb tx, solumedrol   Chronic diastolic CHF  - BNP mildly elevated 227.9 and chest x-ray did not show vascular congestion,  however she admits to dependent fluid and significant weight gain - Followed by Dr. Verlin outpatient - Torsemide  and Zaroxolyn  currently on hold due to worsening renal function - Cardiology following - Echocardiogram pending  AKI on CKD stage IIIb - Baseline creatinine 1.4.  Admitted with creatinine 1.9 --> 2.76 --> 2.38 today  Demand ischemia - In setting of above.  Troponin 28, 27  Paroxysmal A-fib - Eliquis   Hyperlipidemia - Crestor    Diabetes mellitus - A1c pending - Refusing CBG checks   DVT prophylaxis:  apixaban  (ELIQUIS ) tablet 2.5 mg Start: 06/16/24 2200 apixaban  (ELIQUIS ) tablet 2.5 mg  Code Status: DNR Family Communication: None at bedside Disposition Plan: Home Status is: Inpatient Remains inpatient appropriate because: IV solumedrol and dyspnea       Antimicrobials:  Anti-infectives (From admission, onward)    None        Objective: Vitals:   06/17/24 2325 06/18/24 0441 06/18/24 0824 06/18/24 1053  BP: (!) 132/55 (!) 121/48 123/86   Pulse: 66 81    Resp: 18 20 16 16   Temp: (!) 97.5 F (36.4 C) 97.8 F (36.6 C) (!) 97.5 F (36.4 C)   TempSrc: Oral Oral Oral   SpO2: 93% 95%    Weight:      Height:        Intake/Output Summary (Last 24 hours) at 06/18/2024 1234 Last data filed at 06/18/2024 0603 Gross per 24 hour  Intake 540 ml  Output 1250 ml  Net -710 ml   Filed Weights   06/16/24 1411 06/16/24 1835 06/17/24 0555  Weight: 88.9 kg 88.6 kg 90 kg    Examination:  General exam: Appears calm and comfortable  Respiratory  system: Diminished breath sounds without wheeze or rhonchi, room air, conversational dyspnea today  Cardiovascular system: S1 & S2 heard, RRR. No murmurs. No pedal edema. Gastrointestinal system: Abdomen is nondistended, soft  Central nervous system: Alert and oriented. No focal neurological deficits. Speech clear.  Extremities: Symmetric in appearance  Skin: No rashes, lesions or ulcers on exposed skin   Psychiatry: Judgement and insight appear normal. Mood & affect appropriate.   Data Reviewed: I have personally reviewed following labs and imaging studies  CBC: Recent Labs  Lab 06/16/24 1441 06/17/24 0410  WBC 8.7 8.2  NEUTROABS 7.1  --   HGB 11.6* 11.4*  HCT 35.3* 35.5*  MCV 92.2 92.0  PLT 300 317   Basic Metabolic Panel: Recent Labs  Lab 06/16/24 1539 06/16/24 1958 06/17/24 0410 06/17/24 1440 06/18/24 0434  NA 138  --  138 134* 136  K 2.8*  --  3.9 4.0 3.5  CL 97*  --  97* 97* 98  CO2 27  --  22 23 24   GLUCOSE 100*  --  227* 226* 188*  BUN 54*  --  55* 61* 61*  CREATININE 1.90*  --  2.76* 2.38* 2.38*  CALCIUM  7.0*  --  7.7* 8.0* 8.2*  MG  --  1.7  --   --   --   PHOS  --  4.0  --  3.8  --    GFR: Estimated Creatinine Clearance: 17 mL/min (A) (by C-G formula based on SCr of 2.38 mg/dL (H)). Liver Function Tests: Recent Labs  Lab 06/16/24 1539 06/17/24 1440  AST 17  --   ALT 10  --   ALKPHOS 71  --   BILITOT 0.7  --   PROT 6.2*  --   ALBUMIN 2.8* 3.0*   No results for input(s): LIPASE, AMYLASE in the last 168 hours. No results for input(s): AMMONIA in the last 168 hours. Coagulation Profile: No results for input(s): INR, PROTIME in the last 168 hours. Cardiac Enzymes: No results for input(s): CKTOTAL, CKMB, CKMBINDEX, TROPONINI in the last 168 hours. BNP (last 3 results) No results for input(s): PROBNP in the last 8760 hours. HbA1C: No results for input(s): HGBA1C in the last 72 hours. CBG: No results for input(s): GLUCAP in the last 168 hours. Lipid Profile: No results for input(s): CHOL, HDL, LDLCALC, TRIG, CHOLHDL, LDLDIRECT in the last 72 hours. Thyroid  Function Tests: No results for input(s): TSH, T4TOTAL, FREET4, T3FREE, THYROIDAB in the last 72 hours. Anemia Panel: No results for input(s): VITAMINB12, FOLATE, FERRITIN, TIBC, IRON , RETICCTPCT in the last 72 hours. Sepsis Labs: No  results for input(s): PROCALCITON, LATICACIDVEN in the last 168 hours.  Recent Results (from the past 240 hours)  Resp panel by RT-PCR (RSV, Flu A&B, Covid) Anterior Nasal Swab     Status: None   Collection Time: 06/16/24  4:35 PM   Specimen: Anterior Nasal Swab  Result Value Ref Range Status   SARS Coronavirus 2 by RT PCR NEGATIVE NEGATIVE Final   Influenza A by PCR NEGATIVE NEGATIVE Final   Influenza B by PCR NEGATIVE NEGATIVE Final    Comment: (NOTE) The Xpert Xpress SARS-CoV-2/FLU/RSV plus assay is intended as an aid in the diagnosis of influenza from Nasopharyngeal swab specimens and should not be used as a sole basis for treatment. Nasal washings and aspirates are unacceptable for Xpert Xpress SARS-CoV-2/FLU/RSV testing.  Fact Sheet for Patients: BloggerCourse.com  Fact Sheet for Healthcare Providers: SeriousBroker.it  This test is not yet approved or cleared  by the [place]  FDA and has been authorized for detection and/or diagnosis of SARS-CoV-2 by FDA under an Emergency Use Authorization (EUA). This EUA will remain in effect (meaning this test can be used) for the duration of the COVID-19 declaration under Section 564(b)(1) of the Act, 21 U.S.C. section 360bbb-3(b)(1), unless the authorization is terminated or revoked.     Resp Syncytial Virus by PCR NEGATIVE NEGATIVE Final    Comment: (NOTE) Fact Sheet for Patients: BloggerCourse.com  Fact Sheet for Healthcare Providers: SeriousBroker.it  This test is not yet approved or cleared by the [place]  FDA and has been authorized for detection and/or diagnosis of SARS-CoV-2 by FDA under an Emergency Use Authorization (EUA). This EUA will remain in effect (meaning this test can be used) for the duration of the COVID-19 declaration under Section 564(b)(1) of the Act, 21 U.S.C. section 360bbb-3(b)(1), unless the  authorization is terminated or revoked.  Performed at [facility], [address] [place], [place] [address]       Radiology Studies: DG Finger Index Left Result Date: 06/16/2024 CLINICAL DATA:  Left finger swelling. EXAM: LEFT INDEX FINGER 2+V COMPARISON:  None Available. FINDINGS: There is no evidence of fracture or dislocation. There is no evidence of arthropathy or other focal bone abnormality. Mild diffuse soft tissue swelling is noted. IMPRESSION: Mild diffuse soft tissue swelling.  No fracture or dislocation. Electronically Signed   By: Lynwood Landy Raddle M.D.   On: 06/16/2024 15:43   DG Hand Complete Right Result Date: 06/16/2024 CLINICAL DATA:  Right hand pain. EXAM: RIGHT HAND - COMPLETE 3+ VIEW COMPARISON:  None Available. FINDINGS: There is no evidence of fracture or dislocation. Severe degenerative changes seen involving first carpometacarpal joint. Soft tissues are unremarkable. IMPRESSION: Severe osteoarthritis of first carpometacarpal joint. No acute abnormality seen. Electronically Signed   By: Lynwood Landy Raddle M.D.   On: 06/16/2024 15:41   DG Chest 1 View Result Date: 06/16/2024 CLINICAL DATA:  Shortness of breath. EXAM: CHEST  1 VIEW COMPARISON:  September 26, 2023. FINDINGS: Mild cardiomegaly is noted. No acute pulmonary disease is noted. Bony thorax is unremarkable. IMPRESSION: No active disease. Aortic Atherosclerosis (ICD10-I70.0). Electronically Signed   By: Lynwood Landy Raddle M.D.   On: 06/16/2024 15:40      Scheduled Meds:  apixaban   2.5 mg Oral BID   arformoterol   15 mcg Nebulization BID   And   umeclidinium bromide   1 puff Inhalation Daily   dorzolamide   1 drop Both Eyes BID   gabapentin   100 mg Oral q morning   gabapentin   200 mg Oral QHS   ipratropium-albuterol   3 mL Nebulization QID   latanoprost   1 drop Both Eyes q AM   methylPREDNISolone  (SOLU-MEDROL ) injection  125 mg Intravenous Daily   pantoprazole   40 mg Oral Daily   rosuvastatin   20 mg Oral Daily    Continuous Infusions:   LOS: 1 day   Time spent: 25 minutes   Delon Hoe, DO [facility] 06/18/2024, 12:34 PM   Available via Epic secure chat 7am-7pm After these hours, please refer to coverage provider listed on [email or web address]

## 2024-06-18 NOTE — Progress Notes (Signed)
 Progress Note  Patient Name: Jenny [name] Date of Encounter: 06/18/2024 [facility] Cardiologist: Lonni Cash, MD  Interval Summary   Patient reports feeling well this AM. No chest pain. Breathing well at rest, continues to have some dyspnea on exertion. She quit smoking around 2 years ago   Vital Signs Vitals:   06/17/24 1950 06/17/24 2325 06/18/24 0441 06/18/24 0824  BP: (!) 138/98 (!) 132/55 (!) 121/48 123/86  Pulse: 60 66 81   Resp: 20 18 20 16   Temp: 97.7 F (36.5 C) (!) 97.5 F (36.4 C) 97.8 F (36.6 C) (!) 97.5 F (36.4 C)  TempSrc: Oral Oral Oral Oral  SpO2: 98% 93% 95%   Weight:      Height:        Intake/Output Summary (Last 24 hours) at 06/18/2024 0848 Last data filed at 06/18/2024 0603 Gross per 24 hour  Intake 540 ml  Output 1250 ml  Net -710 ml      06/17/2024    5:55 AM 06/16/2024    6:35 PM 06/16/2024    2:11 PM  Last 3 Weights  Weight (lbs) 198 lb 6.4 oz 195 lb 5.2 oz 196 lb  Weight (kg) 89.994 kg 88.6 kg 88.905 kg      Telemetry/ECG  Atrial fibrillation with HR in the 50s-60s - Personally Reviewed  Physical Exam  GEN: No acute distress.  Laying on her side, asleep but awakens easily to voice  Neck: No JVD Cardiac: Irregular rate and rhythm, no murmurs, rubs, or gallops.  Respiratory: Distant breath sounds, clear to auscultation bilaterally. Normal WOB on room air  GI: Soft, nontender, non-distended  MS: No edema in BLE   Assessment & Plan   Dyspnea on exertion  Chronic HFpEF  Pulmonary Artery HTN  - Patient has known history of HFpEF- most recent echocardiogram from 04/2023 showed EF 65-70%, no regional wall motion abnormalities, grade II DD, normal RV systolic function, severely elevated PA systolic pressure, mild-moderate TR  - Presented with progressive DOE that had been going on for the past few months  - BNP 227- note, this seems to be her baseline and is not significantly elevated compared to previous results  - CXR  on arrival showed no active disease, mild cardiomegaly  - PTA, patient was on torsemide  20 mg BID and metolazone  2.5 mg PRN  - Baseline creatinine around 1.4. Was elevated to 1.90>2.76 in the ED  - Patient dry on exam  - Diuretics currently held with AKI. Suspect that her SOB is due to COPD rather than heart failure exacerbation  - Repeat echo pending - if no significant changes from prior, can likely be Dc'ed today  Permanent atrial fibrillation  History of Mobitz type 1 - Per telemetry, patient remains in afib with HR in the 50s-60s - No AV nodal medications  - Continue eliquis  2.5 mg BID- dose reduced for age, renal function  CAD  - Patient has had known moderate LAD stenosis since her cath in 2010. Has refused cath in the past  - No chest pain - Continue crestor  20 mg daily. Not on ASA due to eliquis  use   History of tobacco use  - Quit smoking 2 years ago   Otherwise per primary  - AKI on CKD stage IIIb  - COPD  - HLD  - Diabetes   For questions or updates, please contact [facility] Please consult [email or web address] for contact info under       Signed, Rollo SAUNDERS.  [place], PA-C

## 2024-06-19 DIAGNOSIS — J441 Chronic obstructive pulmonary disease with (acute) exacerbation: Secondary | ICD-10-CM | POA: Diagnosis not present

## 2024-06-19 DIAGNOSIS — I482 Chronic atrial fibrillation, unspecified: Secondary | ICD-10-CM

## 2024-06-19 DIAGNOSIS — I2510 Atherosclerotic heart disease of native coronary artery without angina pectoris: Secondary | ICD-10-CM

## 2024-06-19 DIAGNOSIS — I342 Nonrheumatic mitral (valve) stenosis: Secondary | ICD-10-CM

## 2024-06-19 LAB — BASIC METABOLIC PANEL WITH GFR
Anion gap: 12 (ref 5–15)
BUN: 61 mg/dL — ABNORMAL HIGH (ref 8–23)
CO2: 25 mmol/L (ref 22–32)
Calcium: 8.7 mg/dL — ABNORMAL LOW (ref 8.9–10.3)
Chloride: 97 mmol/L — ABNORMAL LOW (ref 98–111)
Creatinine, Ser: 2.43 mg/dL — ABNORMAL HIGH (ref 0.44–1.00)
GFR, Estimated: 19 mL/min — ABNORMAL LOW (ref >60)
Glucose, Bld: 268 mg/dL — ABNORMAL HIGH (ref 70–99)
Potassium: 5 mmol/L (ref 3.5–5.1)
Sodium: 134 mmol/L — ABNORMAL LOW (ref 135–145)

## 2024-06-19 MED ORDER — IPRATROPIUM-ALBUTEROL 0.5-2.5 (3) MG/3ML IN SOLN
3.0000 mL | Freq: Three times a day (TID) | RESPIRATORY_TRACT | Status: DC
  Administered 2024-06-19 – 2024-06-21 (×5): 3 mL via RESPIRATORY_TRACT
  Filled 2024-06-19 (×5): qty 3

## 2024-06-19 MED ORDER — PREDNISONE 20 MG PO TABS
50.0000 mg | ORAL_TABLET | Freq: Every day | ORAL | Status: DC
  Administered 2024-06-20 – 2024-06-22 (×3): 50 mg via ORAL
  Filled 2024-06-19 (×3): qty 1

## 2024-06-19 NOTE — Progress Notes (Signed)
 Mobility Specialist Progress Note:    06/19/24 1116  Mobility  Activity Ambulated with assistance in hallway;Ambulated with assistance to bathroom  Level of Assistance Contact guard assist, steadying assist  Assistive Device Four wheel walker  Distance Ambulated (ft) 250 ft  Activity Response Tolerated well  Mobility Referral Yes  Mobility visit 1 Mobility  Mobility Specialist Start Time (ACUTE ONLY) 1017  Mobility Specialist Stop Time (ACUTE ONLY) 1033  Mobility Specialist Time Calculation (min) (ACUTE ONLY) 16 min   Received pt in chair and agreeable to mobility. Pt requested to use BR before ambulating in hall. Required no physical assistance. C/o SOB and BLE weakness. Pt took 2 seated rest breaks. SPO2 94%-98%. Returned pt to room and left EOB. Personal belongings and call light within reach. All needs met.   Lavanda Pollack Mobility Specialist  Please contact via [employer] or  Rehab Office [phone]

## 2024-06-19 NOTE — Progress Notes (Signed)
 PROGRESS NOTE    Jenny [name]  FMW:[id number] DOB: 18-Sep-1933 DOA: 06/16/2024 PCP: Ileen Rosaline NOVAK, NP     Brief Narrative:  Giada Schoppe  is a 89 y.o. female, with medical history significant of HFpEF, atrial fibrillation on Eliquis , COPD, hypertension, tachybradycardia syndrome, hyperlipidemia, CAD, presents to the emergency department with worsening shortness of breath.  Patient reports ongoing dyspnea, progressive over the last few months to few weeks, but much worsened recently, she was seen by nephrology recently, where she was instructed to increase her oral diuretic, reports not much improvement of her symptoms.  In ED she had low-grade temperature 99.1, she had no hypoxia, but had significantly diminished air entry bilaterally, chest x-ray with no evidence of volume overload, her BNP was elevated at 227, but this is less than her baseline most recently at 441, high-sensitivity troponins elevated at 28, labs were significant for hypokalemia 2.8, and creatinine elevated at 1.9 from baseline of 1.5, patient received IV steroids, nebulizer treatment with improvement of her symptoms, [facility] consulted to admit.  New events last 24 hours / Subjective: Thinks her breathing is about the same.  Concerned about going home without getting rid of her extra fluid.  She states that she still is holding onto a lot of fluid around her abdomen.  Has visible conversational dyspnea. Says she has built up tolerance to torsemide  and metolazone   Assessment & Plan:   Principal Problem:   COPD exacerbation (HCC) Active Problems:   Tachycardia-bradycardia syndrome (HCC)   Essential hypertension   Stage 3b chronic kidney disease (HCC)   COPD (chronic obstructive pulmonary disease) (HCC)   Obese   Persistent atrial fibrillation (HCC)   Heart failure (HCC)   Type II diabetes mellitus with peripheral circulatory disorder (HCC)   SOB (shortness of breath)   Dyspnea on exertion, multifactorial  with COPD exacerbation and diastolic CHF - COVID/influenza/RSV negative - Neb tx, solumedrol --> prednisone  tmrw   Chronic diastolic CHF  - BNP mildly elevated 227.9 and chest x-ray did not show vascular congestion, however she admits to dependent fluid and significant weight gain - Followed by Dr. Verlin outpatient - Torsemide  and Zaroxolyn  currently on hold due to worsening renal function - Cardiology consulted, signed off 7/12 - Echocardiogram EF 60-65%   AKI on CKD stage IIIb - Baseline creatinine 1.4.  Admitted with creatinine 1.9 --> 2.76 --> 2.38 --> 2.43  Demand ischemia - In setting of above.  Troponin 28, 27  Paroxysmal A-fib - Eliquis   Hyperlipidemia - Crestor    Diabetes mellitus - A1c 6.6 - Refusing CBG checks   DVT prophylaxis:  apixaban  (ELIQUIS ) tablet 2.5 mg Start: 06/16/24 2200 apixaban  (ELIQUIS ) tablet 2.5 mg  Code Status: DNR Family Communication: None at bedside Disposition Plan: Home Status is: Inpatient Remains inpatient appropriate because: Dyspnea    Antimicrobials:  Anti-infectives (From admission, onward)    None        Objective: Vitals:   06/19/24 0516 06/19/24 0829 06/19/24 0839 06/19/24 1211  BP: 126/63 (!) 146/63  (!) 159/61  Pulse: 67   (!) 51  Resp: 18 19  16   Temp: 98.3 F (36.8 C) 98.4 F (36.9 C)  97.7 F (36.5 C)  TempSrc: Oral Oral  Oral  SpO2: 100%  98% 97%  Weight:      Height:        Intake/Output Summary (Last 24 hours) at 06/19/2024 1310 Last data filed at 06/19/2024 1115 Gross per 24 hour  Intake 810 ml  Output  900 ml  Net -90 ml   Filed Weights   06/16/24 1411 06/16/24 1835 06/17/24 0555  Weight: 88.9 kg 88.6 kg 90 kg    Examination:  General exam: Appears calm and comfortable  Respiratory system: Diminished breath sounds without wheeze or rhonchi, room air, +conversational dyspnea today  Cardiovascular system: S1 & S2 heard, RRR. No murmurs. No pedal edema. Gastrointestinal system: Abdomen is  nondistended, soft  Central nervous system: Alert and oriented. No focal neurological deficits. Speech clear.  Extremities: Symmetric in appearance  Skin: No rashes, lesions or ulcers on exposed skin  Psychiatry: Judgement and insight appear normal. Mood & affect appropriate.   Data Reviewed: I have personally reviewed following labs and imaging studies  CBC: Recent Labs  Lab 06/16/24 1441 06/17/24 0410  WBC 8.7 8.2  NEUTROABS 7.1  --   HGB 11.6* 11.4*  HCT 35.3* 35.5*  MCV 92.2 92.0  PLT 300 317   Basic Metabolic Panel: Recent Labs  Lab 06/16/24 1539 06/16/24 1958 06/17/24 0410 06/17/24 1440 06/18/24 0434 06/19/24 0430  NA 138  --  138 134* 136 134*  K 2.8*  --  3.9 4.0 3.5 5.0  CL 97*  --  97* 97* 98 97*  CO2 27  --  22 23 24 25   GLUCOSE 100*  --  227* 226* 188* 268*  BUN 54*  --  55* 61* 61* 61*  CREATININE 1.90*  --  2.76* 2.38* 2.38* 2.43*  CALCIUM  7.0*  --  7.7* 8.0* 8.2* 8.7*  MG  --  1.7  --   --   --   --   PHOS  --  4.0  --  3.8  --   --    GFR: Estimated Creatinine Clearance: 16.7 mL/min (A) (by C-G formula based on SCr of 2.43 mg/dL (H)). Liver Function Tests: Recent Labs  Lab 06/16/24 1539 06/17/24 1440  AST 17  --   ALT 10  --   ALKPHOS 71  --   BILITOT 0.7  --   PROT 6.2*  --   ALBUMIN 2.8* 3.0*   No results for input(s): LIPASE, AMYLASE in the last 168 hours. No results for input(s): AMMONIA in the last 168 hours. Coagulation Profile: No results for input(s): INR, PROTIME in the last 168 hours. Cardiac Enzymes: No results for input(s): CKTOTAL, CKMB, CKMBINDEX, TROPONINI in the last 168 hours. BNP (last 3 results) No results for input(s): PROBNP in the last 8760 hours. HbA1C: Recent Labs    06/18/24 0434  HGBA1C 6.6*   CBG: No results for input(s): GLUCAP in the last 168 hours. Lipid Profile: No results for input(s): CHOL, HDL, LDLCALC, TRIG, CHOLHDL, LDLDIRECT in the last 72 hours. Thyroid   Function Tests: No results for input(s): TSH, T4TOTAL, FREET4, T3FREE, THYROIDAB in the last 72 hours. Anemia Panel: No results for input(s): VITAMINB12, FOLATE, FERRITIN, TIBC, IRON , RETICCTPCT in the last 72 hours. Sepsis Labs: No results for input(s): PROCALCITON, LATICACIDVEN in the last 168 hours.  Recent Results (from the past 240 hours)  Resp panel by RT-PCR (RSV, Flu A&B, Covid) Anterior Nasal Swab     Status: None   Collection Time: 06/16/24  4:35 PM   Specimen: Anterior Nasal Swab  Result Value Ref Range Status   SARS Coronavirus 2 by RT PCR NEGATIVE NEGATIVE Final   Influenza A by PCR NEGATIVE NEGATIVE Final   Influenza B by PCR NEGATIVE NEGATIVE Final    Comment: (NOTE) The Xpert Xpress SARS-CoV-2/FLU/RSV plus assay is  intended as an aid in the diagnosis of influenza from Nasopharyngeal swab specimens and should not be used as a sole basis for treatment. Nasal washings and aspirates are unacceptable for Xpert Xpress SARS-CoV-2/FLU/RSV testing.  Fact Sheet for Patients: BloggerCourse.com  Fact Sheet for Healthcare Providers: SeriousBroker.it  This test is not yet approved or cleared by the [place]  FDA and has been authorized for detection and/or diagnosis of SARS-CoV-2 by FDA under an Emergency Use Authorization (EUA). This EUA will remain in effect (meaning this test can be used) for the duration of the COVID-19 declaration under Section 564(b)(1) of the Act, 21 U.S.C. section 360bbb-3(b)(1), unless the authorization is terminated or revoked.     Resp Syncytial Virus by PCR NEGATIVE NEGATIVE Final    Comment: (NOTE) Fact Sheet for Patients: BloggerCourse.com  Fact Sheet for Healthcare Providers: SeriousBroker.it  This test is not yet approved or cleared by the [place]  FDA and has been authorized for detection and/or diagnosis  of SARS-CoV-2 by FDA under an Emergency Use Authorization (EUA). This EUA will remain in effect (meaning this test can be used) for the duration of the COVID-19 declaration under Section 564(b)(1) of the Act, 21 U.S.C. section 360bbb-3(b)(1), unless the authorization is terminated or revoked.  Performed at [facility], [address], [place], [place] [address]       Radiology Studies: ECHOCARDIOGRAM COMPLETE Result Date: 06/18/2024    ECHOCARDIOGRAM REPORT   Patient Name:   WAKEELAH SOLAN Date of Exam: 06/18/2024 Medical Rec #:  [id number]     Height:       62.0 in Accession #:    [id number]    Weight:       198.4 lb Date of Birth:  03-31-1933     BSA:          1.905 m Patient Age:    89 years      BP:           123/86 mmHg Patient Gender: F             HR:           54 bpm. Exam Location:  Inpatient Procedure: 2D Echo, Cardiac Doppler and Color Doppler (Both Spectral and Color            Flow Doppler were utilized during procedure). Indications:    Dyspnea  History:        Patient has prior history of Echocardiogram examinations. CHF,                 Arrythmias:Atrial Fibrillation, Signs/Symptoms:Shortness of                 Breath; Risk Factors:Hypertension.  Sonographer:    CM Referring Phys: [id number] TAYLOR A PARCELLS IMPRESSIONS  1. Left ventricular ejection fraction, by estimation, is 60 to 65%. The left ventricle has normal function. The left ventricle has no regional wall motion abnormalities. There is moderate asymmetric left ventricular hypertrophy of the basal-septal segment. Left ventricular diastolic parameters are indeterminate.  2. Right ventricular systolic function is normal. The right ventricular size is mildly enlarged. There is mildly elevated pulmonary artery systolic pressure. The estimated right ventricular systolic pressure is 43.7 mmHg.  3. Left atrial size was moderately dilated.  4. Right atrial size was moderately dilated.  5. The mitral valve is degenerative. Trivial  mitral valve regurgitation. Moderate mitral stenosis. Severe mitral annular calcification. MG , MVA 1.4cm^2 by continuity equation.  6. The aortic valve is  tricuspid. Aortic valve regurgitation is not visualized. Aortic valve sclerosis/calcification is present, without any evidence of aortic stenosis. FINDINGS  Left Ventricle: Left ventricular ejection fraction, by estimation, is 60 to 65%. The left ventricle has normal function. The left ventricle has no regional wall motion abnormalities. The left ventricular internal cavity size was normal in size. There is  moderate asymmetric left ventricular hypertrophy of the basal-septal segment. Left ventricular diastolic parameters are indeterminate. Right Ventricle: The right ventricular size is mildly enlarged. Right vetricular wall thickness was not well visualized. Right ventricular systolic function is normal. There is mildly elevated pulmonary artery systolic pressure. The tricuspid regurgitant  velocity is 3.19 m/s, and with an assumed right atrial pressure of 3 mmHg, the estimated right ventricular systolic pressure is 43.7 mmHg. Left Atrium: Left atrial size was moderately dilated. Right Atrium: Right atrial size was moderately dilated. Pericardium: There is no evidence of pericardial effusion. Mitral Valve: The mitral valve is degenerative in appearance. Severe mitral annular calcification. Trivial mitral valve regurgitation. Moderate mitral valve stenosis. Tricuspid Valve: The tricuspid valve is normal in structure. Tricuspid valve regurgitation is trivial. Aortic Valve: The aortic valve is tricuspid. Aortic valve regurgitation is not visualized. Aortic valve sclerosis/calcification is present, without any evidence of aortic stenosis. Aortic valve mean gradient measures 5.0 mmHg. Aortic valve peak gradient measures 10.6 mmHg. Aortic valve area, by VTI measures 2.56 cm. Pulmonic Valve: The pulmonic valve was not well visualized. Pulmonic valve regurgitation  is not visualized. Aorta: The aortic root and ascending aorta are structurally normal, with no evidence of dilitation. IAS/Shunts: The interatrial septum was not well visualized.  LEFT VENTRICLE PLAX 2D LVIDd:         4.40 cm   Diastology LVIDs:         2.80 cm   LV e' lateral:   6.96 cm/s LV PW:         1.20 cm   LV E/e' lateral: 18.5 LV IVS:        1.70 cm LVOT diam:     2.10 cm LV SV:         89 LV SV Index:   47 LVOT Area:     3.46 cm  RIGHT VENTRICLE TAPSE (M-mode): 1.9 cm LEFT ATRIUM              Index        RIGHT ATRIUM           Index LA Vol (A2C):   100.0 ml 52.49 ml/m  RA Area:     27.30 cm LA Vol (A4C):   59.0 ml  30.97 ml/m  RA Volume:   88.30 ml  46.35 ml/m LA Biplane Vol: 82.3 ml  43.20 ml/m  AORTIC VALVE AV Area (Vmax):    2.83 cm AV Area (Vmean):   2.72 cm AV Area (VTI):     2.56 cm AV Vmax:           163.00 cm/s AV Vmean:          111.000 cm/s AV VTI:            0.346 m AV Peak Grad:      10.6 mmHg AV Mean Grad:      5.0 mmHg LVOT Vmax:         133.00 cm/s LVOT Vmean:        87.200 cm/s LVOT VTI:          0.256 m LVOT/AV VTI ratio: 0.74  AORTA Ao Root  diam: 2.70 cm Ao Asc diam:  3.20 cm MITRAL VALVE                TRICUSPID VALVE MV Area (PHT): 2.41 cm     TR Peak grad:   40.7 mmHg MV Decel Time: 315 msec     TR Vmax:        319.00 cm/s MV E velocity: 129.00 cm/s MV A velocity: 62.40 cm/s   SHUNTS MV E/A ratio:  2.07         Systemic VTI:  0.26 m                             Systemic Diam: 2.10 cm Lonni Nanas MD Electronically signed by Lonni Nanas MD Signature Date/Time: 06/18/2024/2:18:17 PM    Final       Scheduled Meds:  apixaban   2.5 mg Oral BID   arformoterol   15 mcg Nebulization BID   And   umeclidinium bromide   1 puff Inhalation Daily   dorzolamide   1 drop Both Eyes BID   gabapentin   100 mg Oral q morning   gabapentin   200 mg Oral QHS   ipratropium-albuterol   3 mL Nebulization TID   latanoprost   1 drop Both Eyes q AM   methylPREDNISolone   (SOLU-MEDROL ) injection  40 mg Intravenous Daily   pantoprazole   40 mg Oral Daily   rosuvastatin   20 mg Oral Daily   Continuous Infusions:   LOS: 2 days   Time spent: 25 minutes   Delon Hoe, DO [facility] 06/19/2024, 1:10 PM   Available via Epic secure chat 7am-7pm After these hours, please refer to coverage provider listed on [email or web address]

## 2024-06-19 NOTE — Plan of Care (Signed)

## 2024-06-19 NOTE — Progress Notes (Signed)
 Cardiologist:  Verlin  Subjective:   Breathing improved   Objective:  Vitals:   06/18/24 1738 06/18/24 1928 06/19/24 0046 06/19/24 0516  BP: (!) 130/90 131/69 (!) 173/99 126/63  Pulse:  (!) 135 79 67  Resp: 15 15 20 18   Temp: 97.6 F (36.4 C) 97.6 F (36.4 C) 97.8 F (36.6 C) 98.3 F (36.8 C)  TempSrc: Oral Oral Oral Oral  SpO2:  (!) 87% 98% 100%  Weight:      Height:        Intake/Output from previous day:  Intake/Output Summary (Last 24 hours) at 06/19/2024 0727 Last data filed at 06/19/2024 0517 Gross per 24 hour  Intake 240 ml  Output 900 ml  Net -660 ml    Physical Exam:  Obese female Ex wheezing  No murmur Abdomen benign Plus one bilateral edema with stasis  And dry skin  Lab Results: Basic Metabolic Panel: Recent Labs    06/16/24 1958 06/17/24 0410 06/17/24 1440 06/18/24 0434 06/19/24 0430  NA  --    < > 134* 136 134*  K  --    < > 4.0 3.5 5.0  CL  --    < > 97* 98 97*  CO2  --    < > 23 24 25   GLUCOSE  --    < > 226* 188* 268*  BUN  --    < > 61* 61* 61*  CREATININE  --    < > 2.38* 2.38* 2.43*  CALCIUM   --    < > 8.0* 8.2* 8.7*  MG 1.7  --   --   --   --   PHOS 4.0  --  3.8  --   --    < > = values in this interval not displayed.   Liver Function Tests: Recent Labs    06/16/24 1539 06/17/24 1440  AST 17  --   ALT 10  --   ALKPHOS 71  --   BILITOT 0.7  --   PROT 6.2*  --   ALBUMIN 2.8* 3.0*   No results for input(s): LIPASE, AMYLASE in the last 72 hours. CBC: Recent Labs    06/16/24 1441 06/17/24 0410  WBC 8.7 8.2  NEUTROABS 7.1  --   HGB 11.6* 11.4*  HCT 35.3* 35.5*  MCV 92.2 92.0  PLT 300 317    Hemoglobin A1C: Recent Labs    06/18/24 0434  HGBA1C 6.6*    Imaging: ECHOCARDIOGRAM COMPLETE Result Date: 06/18/2024    ECHOCARDIOGRAM REPORT   Patient Name:   RHYLI DEPAULA Date of Exam: 06/18/2024 Medical Rec #:  [id number]     Height:       62.0 in Accession #:    [id number]    Weight:       198.4 lb Date of  Birth:  04/25/1933     BSA:          1.905 m Patient Age:    89 years      BP:           123/86 mmHg Patient Gender: F             HR:           54 bpm. Exam Location:  Inpatient Procedure: 2D Echo, Cardiac Doppler and Color Doppler (Both Spectral and Color            Flow Doppler were utilized during procedure). Indications:    Dyspnea  History:  Patient has prior history of Echocardiogram examinations. CHF,                 Arrythmias:Atrial Fibrillation, Signs/Symptoms:Shortness of                 Breath; Risk Factors:Hypertension.  Sonographer:    CM Referring Phys: [id number] TAYLOR A PARCELLS IMPRESSIONS  1. Left ventricular ejection fraction, by estimation, is 60 to 65%. The left ventricle has normal function. The left ventricle has no regional wall motion abnormalities. There is moderate asymmetric left ventricular hypertrophy of the basal-septal segment. Left ventricular diastolic parameters are indeterminate.  2. Right ventricular systolic function is normal. The right ventricular size is mildly enlarged. There is mildly elevated pulmonary artery systolic pressure. The estimated right ventricular systolic pressure is 43.7 mmHg.  3. Left atrial size was moderately dilated.  4. Right atrial size was moderately dilated.  5. The mitral valve is degenerative. Trivial mitral valve regurgitation. Moderate mitral stenosis. Severe mitral annular calcification. MG , MVA 1.4cm^2 by continuity equation.  6. The aortic valve is tricuspid. Aortic valve regurgitation is not visualized. Aortic valve sclerosis/calcification is present, without any evidence of aortic stenosis. FINDINGS  Left Ventricle: Left ventricular ejection fraction, by estimation, is 60 to 65%. The left ventricle has normal function. The left ventricle has no regional wall motion abnormalities. The left ventricular internal cavity size was normal in size. There is  moderate asymmetric left ventricular hypertrophy of the basal-septal segment.  Left ventricular diastolic parameters are indeterminate. Right Ventricle: The right ventricular size is mildly enlarged. Right vetricular wall thickness was not well visualized. Right ventricular systolic function is normal. There is mildly elevated pulmonary artery systolic pressure. The tricuspid regurgitant  velocity is 3.19 m/s, and with an assumed right atrial pressure of 3 mmHg, the estimated right ventricular systolic pressure is 43.7 mmHg. Left Atrium: Left atrial size was moderately dilated. Right Atrium: Right atrial size was moderately dilated. Pericardium: There is no evidence of pericardial effusion. Mitral Valve: The mitral valve is degenerative in appearance. Severe mitral annular calcification. Trivial mitral valve regurgitation. Moderate mitral valve stenosis. Tricuspid Valve: The tricuspid valve is normal in structure. Tricuspid valve regurgitation is trivial. Aortic Valve: The aortic valve is tricuspid. Aortic valve regurgitation is not visualized. Aortic valve sclerosis/calcification is present, without any evidence of aortic stenosis. Aortic valve mean gradient measures 5.0 mmHg. Aortic valve peak gradient measures 10.6 mmHg. Aortic valve area, by VTI measures 2.56 cm. Pulmonic Valve: The pulmonic valve was not well visualized. Pulmonic valve regurgitation is not visualized. Aorta: The aortic root and ascending aorta are structurally normal, with no evidence of dilitation. IAS/Shunts: The interatrial septum was not well visualized.  LEFT VENTRICLE PLAX 2D LVIDd:         4.40 cm   Diastology LVIDs:         2.80 cm   LV e' lateral:   6.96 cm/s LV PW:         1.20 cm   LV E/e' lateral: 18.5 LV IVS:        1.70 cm LVOT diam:     2.10 cm LV SV:         89 LV SV Index:   47 LVOT Area:     3.46 cm  RIGHT VENTRICLE TAPSE (M-mode): 1.9 cm LEFT ATRIUM              Index        RIGHT ATRIUM  Index LA Vol (A2C):   100.0 ml 52.49 ml/m  RA Area:     27.30 cm LA Vol (A4C):   59.0 ml  30.97 ml/m   RA Volume:   88.30 ml  46.35 ml/m LA Biplane Vol: 82.3 ml  43.20 ml/m  AORTIC VALVE AV Area (Vmax):    2.83 cm AV Area (Vmean):   2.72 cm AV Area (VTI):     2.56 cm AV Vmax:           163.00 cm/s AV Vmean:          111.000 cm/s AV VTI:            0.346 m AV Peak Grad:      10.6 mmHg AV Mean Grad:      5.0 mmHg LVOT Vmax:         133.00 cm/s LVOT Vmean:        87.200 cm/s LVOT VTI:          0.256 m LVOT/AV VTI ratio: 0.74  AORTA Ao Root diam: 2.70 cm Ao Asc diam:  3.20 cm MITRAL VALVE                TRICUSPID VALVE MV Area (PHT): 2.41 cm     TR Peak grad:   40.7 mmHg MV Decel Time: 315 msec     TR Vmax:        319.00 cm/s MV E velocity: 129.00 cm/s MV A velocity: 62.40 cm/s   SHUNTS MV E/A ratio:  2.07         Systemic VTI:  0.26 m                             Systemic Diam: 2.10 cm Lonni Nanas MD Electronically signed by Lonni Nanas MD Signature Date/Time: 06/18/2024/2:18:17 PM    Final     Cardiac Studies:  ECG: afib rate 51 low voltage due to body habitus   Telemetry: afib rates 70's   Echo: EF 60-65% some MS due to MAC   Medications:    apixaban   2.5 mg Oral BID   arformoterol   15 mcg Nebulization BID   And   umeclidinium bromide   1 puff Inhalation Daily   dorzolamide   1 drop Both Eyes BID   gabapentin   100 mg Oral q morning   gabapentin   200 mg Oral QHS   ipratropium-albuterol   3 mL Nebulization QID   latanoprost   1 drop Both Eyes q AM   methylPREDNISolone  (SOLU-MEDROL ) injection  40 mg Intravenous Daily   pantoprazole   40 mg Oral Daily   rosuvastatin   20 mg Oral Daily      Assessment/Plan:  Patient Profile: KATESHA EICHEL is a 89 y.o. female with a hx of CAD s/p PCI in 2010, HTN, HLD, COPD, tobacco abuse, Mobitz 1 heart block, CKD, chronic HFpEF, permanent atrial fibrillation, mild bilateral carotid artery disease, and type 2 diabetes who is being seen 06/17/2024 for the evaluation of acute on chronic HFpEF at the request of Dr. Rojelio.  COPD; continue inhalers and  prednisone  CXR NAD  Afib rates control is good on renal dose eliquis  CHADVASC 7 CRF baseline appears 1.4 up to 2.43 this am K 5.0 EF normal consider nephrology consult given poorly controlled DM BNP only 227 avoid diuretics for now  CAD:  distant PCI no angina no indication for ischemic w/u HLD:  continue statin  MS:  functional due to MAC  given this and age only Rx would be rate control of afib and PRN diuretics May contribute some to her PHT along with her COPD  No further cardiac w/u planned in hospital  Cardiology will sign off   Maude Emmer 06/19/2024, 7:27 AM

## 2024-06-20 DIAGNOSIS — J441 Chronic obstructive pulmonary disease with (acute) exacerbation: Secondary | ICD-10-CM | POA: Diagnosis not present

## 2024-06-20 LAB — BASIC METABOLIC PANEL WITH GFR
Anion gap: 12 (ref 5–15)
BUN: 65 mg/dL — ABNORMAL HIGH (ref 8–23)
CO2: 25 mmol/L (ref 22–32)
Calcium: 8.6 mg/dL — ABNORMAL LOW (ref 8.9–10.3)
Chloride: 98 mmol/L (ref 98–111)
Creatinine, Ser: 2.46 mg/dL — ABNORMAL HIGH (ref 0.44–1.00)
GFR, Estimated: 18 mL/min — ABNORMAL LOW (ref >60)
Glucose, Bld: 196 mg/dL — ABNORMAL HIGH (ref 70–99)
Potassium: 4.7 mmol/L (ref 3.5–5.1)
Sodium: 135 mmol/L (ref 135–145)

## 2024-06-20 MED ORDER — REVEFENACIN 175 MCG/3ML IN SOLN
175.0000 ug | Freq: Every day | RESPIRATORY_TRACT | Status: DC
  Administered 2024-06-21 – 2024-06-24 (×4): 175 ug via RESPIRATORY_TRACT
  Filled 2024-06-20 (×4): qty 3

## 2024-06-20 MED ORDER — FUROSEMIDE 10 MG/ML IJ SOLN
40.0000 mg | Freq: Once | INTRAMUSCULAR | Status: AC
  Administered 2024-06-20: 40 mg via INTRAVENOUS
  Filled 2024-06-20: qty 4

## 2024-06-20 NOTE — Progress Notes (Addendum)
 PROGRESS NOTE    Jenny [name]  FMW:[id number] DOB: [birth year] DOA: 06/16/2024 PCP: Ileen Rosaline NOVAK, NP     Brief Narrative:  Jenny [name]  is a 89 y.o. female, with medical history significant of HFpEF, atrial fibrillation on Eliquis , COPD, hypertension, tachybradycardia syndrome, hyperlipidemia, CAD, presents to the emergency department with worsening shortness of breath.  Patient reports ongoing dyspnea, progressive over the last few months to few weeks, but much worsened recently, she was seen by nephrology recently, where she was instructed to increase her oral diuretic, reports not much improvement of her symptoms.  In ED she had low-grade temperature 99.1, she had no hypoxia, but had significantly diminished air entry bilaterally, chest x-ray with no evidence of volume overload, her BNP was elevated at 227, but this is less than her baseline most recently at 441, high-sensitivity troponins elevated at 28, labs were significant for hypokalemia 2.8, and creatinine elevated at 1.9 from baseline of 1.5, patient received IV steroids, nebulizer treatment with improvement of her symptoms, [facility] consulted to admit.  New events last 24 hours / Subjective: Breathing is about the same, no improvement and definitely not at her baseline.  She still exhibits pursed lipped breathing.  Still feels like she is fluid overloaded.   Assessment & Plan:   Principal Problem:   COPD exacerbation (HCC) Active Problems:   Tachycardia-bradycardia syndrome (HCC)   Essential hypertension   Stage 3b chronic kidney disease (HCC)   COPD (chronic obstructive pulmonary disease) (HCC)   Obese   Persistent atrial fibrillation (HCC)   Heart failure (HCC)   Type II diabetes mellitus with peripheral circulatory disorder (HCC)   SOB (shortness of breath)   Dyspnea on exertion, multifactorial with COPD exacerbation and diastolic CHF - COVID/influenza/RSV negative - Neb tx, solumedrol --> prednisone     Chronic diastolic CHF  - BNP mildly elevated 227.9 and chest x-ray did not show vascular congestion, however she admits to dependent fluid and significant weight gain - Followed by Dr. Verlin outpatient - Torsemide  and Zaroxolyn  currently on hold due to worsening renal function - Cardiology consulted, signed off 7/12 - Echocardiogram EF 60-65%  - After discussion with patient regarding benefits and risk of IV diuretic use and monitoring kidney function, we decided to trial 1 dose of IV Lasix  to see if she would have any improvement in her respiratory status - Her dry weight in the office was 195 pounds.  This morning her weight was 198 pounds here  AKI on CKD stage IIIb - Baseline creatinine 1.4.  Admitted with creatinine 1.9 --> 2.76 --> 2.38 --> 2.43 --> 2.46  Demand ischemia - In setting of above.  Troponin 28, 27  Paroxysmal A-fib - Eliquis   Hyperlipidemia - Crestor    Diabetes mellitus - A1c 6.6 - Refusing CBG checks   DVT prophylaxis:  apixaban  (ELIQUIS ) tablet 2.5 mg Start: 06/16/24 2200 apixaban  (ELIQUIS ) tablet 2.5 mg  Code Status: DNR Family Communication: None at bedside; called son and left voicemail to call back with qs Disposition Plan: Home Status is: Inpatient Remains inpatient appropriate because: Dyspnea    Antimicrobials:  Anti-infectives (From admission, onward)    None        Objective: Vitals:   06/19/24 2353 06/20/24 0539 06/20/24 0820 06/20/24 1308  BP: (!) 173/84 (!) 152/63 (!) 154/64 (!) 136/57  Pulse: 72 (!) 49  (!) 55  Resp: 18 17 16    Temp: 97.8 F (36.6 C) 97.6 F (36.4 C) (!) 97.3 F (36.3 C) 97.8  F (36.6 C)  TempSrc: Oral Oral Oral Oral  SpO2: 93% 95%    Weight:      Height:        Intake/Output Summary (Last 24 hours) at 06/20/2024 1359 Last data filed at 06/20/2024 0900 Gross per 24 hour  Intake 600 ml  Output 750 ml  Net -150 ml   Filed Weights   06/16/24 1411 06/16/24 1835 06/17/24 0555  Weight: 88.9 kg  88.6 kg 90 kg    Examination:  General exam: Appears calm and comfortable  Respiratory system: Diminished breath sounds, + conversational dyspnea, on room air Cardiovascular system: S1 & S2 heard, RRR. No murmurs.  Nonpitting pedal edema. Gastrointestinal system: Abdomen is nondistended, soft  Central nervous system: Alert and oriented. No focal neurological deficits. Speech clear.  Extremities: Symmetric in appearance  Skin: Dry skin of her legs Psychiatry: Judgement and insight appear normal. Mood & affect appropriate.   Data Reviewed: I have personally reviewed following labs and imaging studies  CBC: Recent Labs  Lab 06/16/24 1441 06/17/24 0410  WBC 8.7 8.2  NEUTROABS 7.1  --   HGB 11.6* 11.4*  HCT 35.3* 35.5*  MCV 92.2 92.0  PLT 300 317   Basic Metabolic Panel: Recent Labs  Lab 06/16/24 1958 06/17/24 0410 06/17/24 1440 06/18/24 0434 06/19/24 0430 06/20/24 0456  NA  --  138 134* 136 134* 135  K  --  3.9 4.0 3.5 5.0 4.7  CL  --  97* 97* 98 97* 98  CO2  --  22 23 24 25 25   GLUCOSE  --  227* 226* 188* 268* 196*  BUN  --  55* 61* 61* 61* 65*  CREATININE  --  2.76* 2.38* 2.38* 2.43* 2.46*  CALCIUM   --  7.7* 8.0* 8.2* 8.7* 8.6*  MG 1.7  --   --   --   --   --   PHOS 4.0  --  3.8  --   --   --    GFR: Estimated Creatinine Clearance: 16.5 mL/min (A) (by C-G formula based on SCr of 2.46 mg/dL (H)). Liver Function Tests: Recent Labs  Lab 06/16/24 1539 06/17/24 1440  AST 17  --   ALT 10  --   ALKPHOS 71  --   BILITOT 0.7  --   PROT 6.2*  --   ALBUMIN 2.8* 3.0*   No results for input(s): LIPASE, AMYLASE in the last 168 hours. No results for input(s): AMMONIA in the last 168 hours. Coagulation Profile: No results for input(s): INR, PROTIME in the last 168 hours. Cardiac Enzymes: No results for input(s): CKTOTAL, CKMB, CKMBINDEX, TROPONINI in the last 168 hours. BNP (last 3 results) No results for input(s): PROBNP in the last 8760  hours. HbA1C: Recent Labs    06/18/24 0434  HGBA1C 6.6*   CBG: No results for input(s): GLUCAP in the last 168 hours. Lipid Profile: No results for input(s): CHOL, HDL, LDLCALC, TRIG, CHOLHDL, LDLDIRECT in the last 72 hours. Thyroid  Function Tests: No results for input(s): TSH, T4TOTAL, FREET4, T3FREE, THYROIDAB in the last 72 hours. Anemia Panel: No results for input(s): VITAMINB12, FOLATE, FERRITIN, TIBC, IRON , RETICCTPCT in the last 72 hours. Sepsis Labs: No results for input(s): PROCALCITON, LATICACIDVEN in the last 168 hours.  Recent Results (from the past 240 hours)  Resp panel by RT-PCR (RSV, Flu A&B, Covid) Anterior Nasal Swab     Status: None   Collection Time: 06/16/24  4:35 PM   Specimen: Anterior Nasal Swab  Result Value Ref Range Status   SARS Coronavirus 2 by RT PCR NEGATIVE NEGATIVE Final   Influenza A by PCR NEGATIVE NEGATIVE Final   Influenza B by PCR NEGATIVE NEGATIVE Final    Comment: (NOTE) The Xpert Xpress SARS-CoV-2/FLU/RSV plus assay is intended as an aid in the diagnosis of influenza from Nasopharyngeal swab specimens and should not be used as a sole basis for treatment. Nasal washings and aspirates are unacceptable for Xpert Xpress SARS-CoV-2/FLU/RSV testing.  Fact Sheet for Patients: BloggerCourse.com  Fact Sheet for Healthcare Providers: SeriousBroker.it  This test is not yet approved or cleared by the [place]  FDA and has been authorized for detection and/or diagnosis of SARS-CoV-2 by FDA under an Emergency Use Authorization (EUA). This EUA will remain in effect (meaning this test can be used) for the duration of the COVID-19 declaration under Section 564(b)(1) of the Act, 21 U.S.C. section 360bbb-3(b)(1), unless the authorization is terminated or revoked.     Resp Syncytial Virus by PCR NEGATIVE NEGATIVE Final    Comment: (NOTE) Fact Sheet for  Patients: BloggerCourse.com  Fact Sheet for Healthcare Providers: SeriousBroker.it  This test is not yet approved or cleared by the [place]  FDA and has been authorized for detection and/or diagnosis of SARS-CoV-2 by FDA under an Emergency Use Authorization (EUA). This EUA will remain in effect (meaning this test can be used) for the duration of the COVID-19 declaration under Section 564(b)(1) of the Act, 21 U.S.C. section 360bbb-3(b)(1), unless the authorization is terminated or revoked.  Performed at [facility], [address] [place], [place] [address]       Radiology Studies: ECHOCARDIOGRAM COMPLETE Result Date: 06/18/2024    ECHOCARDIOGRAM REPORT   Patient Name:   Jenny [name] Date of Exam: 06/18/2024 Medical Rec #:  [id number]     Height:       62.0 in Accession #:    [id number]    Weight:       198.4 lb Date of Birth:  1933/02/20     BSA:          1.905 m Patient Age:    89 years      BP:           123/86 mmHg Patient Gender: F             HR:           54 bpm. Exam Location:  Inpatient Procedure: 2D Echo, Cardiac Doppler and Color Doppler (Both Spectral and Color            Flow Doppler were utilized during procedure). Indications:    Dyspnea  History:        Patient has prior history of Echocardiogram examinations. CHF,                 Arrythmias:Atrial Fibrillation, Signs/Symptoms:Shortness of                 Breath; Risk Factors:Hypertension.  Sonographer:    CM Referring Phys: [id number] TAYLOR A PARCELLS IMPRESSIONS  1. Left ventricular ejection fraction, by estimation, is 60 to 65%. The left ventricle has normal function. The left ventricle has no regional wall motion abnormalities. There is moderate asymmetric left ventricular hypertrophy of the basal-septal segment. Left ventricular diastolic parameters are indeterminate.  2. Right ventricular systolic function is normal. The right ventricular size is mildly enlarged.  There is mildly elevated pulmonary artery systolic pressure. The estimated right ventricular systolic pressure is 43.7 mmHg.  3. Left atrial size was moderately dilated.  4. Right atrial size was moderately dilated.  5. The mitral valve is degenerative. Trivial mitral valve regurgitation. Moderate mitral stenosis. Severe mitral annular calcification. MG , MVA 1.4cm^2 by continuity equation.  6. The aortic valve is tricuspid. Aortic valve regurgitation is not visualized. Aortic valve sclerosis/calcification is present, without any evidence of aortic stenosis. FINDINGS  Left Ventricle: Left ventricular ejection fraction, by estimation, is 60 to 65%. The left ventricle has normal function. The left ventricle has no regional wall motion abnormalities. The left ventricular internal cavity size was normal in size. There is  moderate asymmetric left ventricular hypertrophy of the basal-septal segment. Left ventricular diastolic parameters are indeterminate. Right Ventricle: The right ventricular size is mildly enlarged. Right vetricular wall thickness was not well visualized. Right ventricular systolic function is normal. There is mildly elevated pulmonary artery systolic pressure. The tricuspid regurgitant  velocity is 3.19 m/s, and with an assumed right atrial pressure of 3 mmHg, the estimated right ventricular systolic pressure is 43.7 mmHg. Left Atrium: Left atrial size was moderately dilated. Right Atrium: Right atrial size was moderately dilated. Pericardium: There is no evidence of pericardial effusion. Mitral Valve: The mitral valve is degenerative in appearance. Severe mitral annular calcification. Trivial mitral valve regurgitation. Moderate mitral valve stenosis. Tricuspid Valve: The tricuspid valve is normal in structure. Tricuspid valve regurgitation is trivial. Aortic Valve: The aortic valve is tricuspid. Aortic valve regurgitation is not visualized. Aortic valve sclerosis/calcification is present,  without any evidence of aortic stenosis. Aortic valve mean gradient measures 5.0 mmHg. Aortic valve peak gradient measures 10.6 mmHg. Aortic valve area, by VTI measures 2.56 cm. Pulmonic Valve: The pulmonic valve was not well visualized. Pulmonic valve regurgitation is not visualized. Aorta: The aortic root and ascending aorta are structurally normal, with no evidence of dilitation. IAS/Shunts: The interatrial septum was not well visualized.  LEFT VENTRICLE PLAX 2D LVIDd:         4.40 cm   Diastology LVIDs:         2.80 cm   LV e' lateral:   6.96 cm/s LV PW:         1.20 cm   LV E/e' lateral: 18.5 LV IVS:        1.70 cm LVOT diam:     2.10 cm LV SV:         89 LV SV Index:   47 LVOT Area:     3.46 cm  RIGHT VENTRICLE TAPSE (M-mode): 1.9 cm LEFT ATRIUM              Index        RIGHT ATRIUM           Index LA Vol (A2C):   100.0 ml 52.49 ml/m  RA Area:     27.30 cm LA Vol (A4C):   59.0 ml  30.97 ml/m  RA Volume:   88.30 ml  46.35 ml/m LA Biplane Vol: 82.3 ml  43.20 ml/m  AORTIC VALVE AV Area (Vmax):    2.83 cm AV Area (Vmean):   2.72 cm AV Area (VTI):     2.56 cm AV Vmax:           163.00 cm/s AV Vmean:          111.000 cm/s AV VTI:            0.346 m AV Peak Grad:      10.6 mmHg AV Mean Grad:  5.0 mmHg LVOT Vmax:         133.00 cm/s LVOT Vmean:        87.200 cm/s LVOT VTI:          0.256 m LVOT/AV VTI ratio: 0.74  AORTA Ao Root diam: 2.70 cm Ao Asc diam:  3.20 cm MITRAL VALVE                TRICUSPID VALVE MV Area (PHT): 2.41 cm     TR Peak grad:   40.7 mmHg MV Decel Time: 315 msec     TR Vmax:        319.00 cm/s MV E velocity: 129.00 cm/s MV A velocity: 62.40 cm/s   SHUNTS MV E/A ratio:  2.07         Systemic VTI:  0.26 m                             Systemic Diam: 2.10 cm Lonni Nanas MD Electronically signed by Lonni Nanas MD Signature Date/Time: 06/18/2024/2:18:17 PM    Final       Scheduled Meds:  apixaban   2.5 mg Oral BID   arformoterol   15 mcg Nebulization BID    dorzolamide   1 drop Both Eyes BID   gabapentin   100 mg Oral q morning   gabapentin   200 mg Oral QHS   ipratropium-albuterol   3 mL Nebulization TID   latanoprost   1 drop Both Eyes q AM   pantoprazole   40 mg Oral Daily   predniSONE   50 mg Oral Q breakfast   revefenacin   175 mcg Nebulization Daily   rosuvastatin   20 mg Oral Daily   Continuous Infusions:   LOS: 3 days   Time spent: 25 minutes   Delon Hoe, DO [facility] 06/20/2024, 1:59 PM   Available via Epic secure chat 7am-7pm After these hours, please refer to coverage provider listed on [email or web address]

## 2024-06-21 ENCOUNTER — Inpatient Hospital Stay (HOSPITAL_COMMUNITY)

## 2024-06-21 ENCOUNTER — Other Ambulatory Visit (HOSPITAL_COMMUNITY): Payer: Self-pay

## 2024-06-21 DIAGNOSIS — J441 Chronic obstructive pulmonary disease with (acute) exacerbation: Secondary | ICD-10-CM | POA: Diagnosis not present

## 2024-06-21 DIAGNOSIS — M25551 Pain in right hip: Secondary | ICD-10-CM | POA: Diagnosis present

## 2024-06-21 LAB — BASIC METABOLIC PANEL WITH GFR
Anion gap: 10 (ref 5–15)
BUN: 62 mg/dL — ABNORMAL HIGH (ref 8–23)
CO2: 27 mmol/L (ref 22–32)
Calcium: 8.2 mg/dL — ABNORMAL LOW (ref 8.9–10.3)
Chloride: 100 mmol/L (ref 98–111)
Creatinine, Ser: 2 mg/dL — ABNORMAL HIGH (ref 0.44–1.00)
GFR, Estimated: 24 mL/min — ABNORMAL LOW (ref >60)
Glucose, Bld: 138 mg/dL — ABNORMAL HIGH (ref 70–99)
Potassium: 4.3 mmol/L (ref 3.5–5.1)
Sodium: 137 mmol/L (ref 135–145)

## 2024-06-21 MED ORDER — IPRATROPIUM-ALBUTEROL 0.5-2.5 (3) MG/3ML IN SOLN
3.0000 mL | Freq: Two times a day (BID) | RESPIRATORY_TRACT | Status: DC
  Administered 2024-06-21 – 2024-06-24 (×6): 3 mL via RESPIRATORY_TRACT
  Filled 2024-06-21 (×6): qty 3

## 2024-06-21 MED ORDER — CEFADROXIL 500 MG PO CAPS
500.0000 mg | ORAL_CAPSULE | Freq: Every day | ORAL | Status: DC
  Administered 2024-06-21 – 2024-06-24 (×4): 500 mg via ORAL
  Filled 2024-06-21 (×4): qty 1

## 2024-06-21 MED ORDER — ACETAMINOPHEN-CODEINE 300-30 MG PO TABS
1.0000 | ORAL_TABLET | Freq: Four times a day (QID) | ORAL | Status: DC | PRN
  Administered 2024-06-21 – 2024-06-24 (×9): 1 via ORAL
  Filled 2024-06-21 (×9): qty 1

## 2024-06-21 MED ORDER — CEPHALEXIN 500 MG PO CAPS: 500.0000 mg | ORAL_CAPSULE | Freq: Four times a day (QID) | ORAL | Status: DC

## 2024-06-21 MED ORDER — ACETAMINOPHEN-CODEINE 300-30 MG PO TABS
1.0000 | ORAL_TABLET | Freq: Once | ORAL | Status: AC
  Filled 2024-06-21: qty 1

## 2024-06-21 MED ORDER — NYSTATIN 100000 UNIT/GM EX POWD
Freq: Three times a day (TID) | CUTANEOUS | Status: DC
  Administered 2024-06-23: 1 via TOPICAL
  Filled 2024-06-21 (×2): qty 15

## 2024-06-21 MED ORDER — METHOCARBAMOL 500 MG PO TABS: 500.0000 mg | ORAL_TABLET | Freq: Three times a day (TID) | ORAL | Status: DC | PRN

## 2024-06-21 MED ORDER — FUROSEMIDE 10 MG/ML IJ SOLN
40.0000 mg | Freq: Once | INTRAMUSCULAR | Status: AC
  Administered 2024-06-21: 40 mg via INTRAVENOUS
  Filled 2024-06-21: qty 4

## 2024-06-21 NOTE — Assessment & Plan Note (Signed)
 Chronic , pt states HR are in 30-50 at baseline

## 2024-06-21 NOTE — Assessment & Plan Note (Signed)
 Continue on Prednisone  Albuterol  nebs PRN  If pt agrees

## 2024-06-21 NOTE — Progress Notes (Signed)
 Occupational Therapy Treatment Patient Details Name: Jenny [name] MRN: [id number] DOB: May 28, 1933 Today's Date: 06/21/2024   History of present illness Pt is a 89 yr old female who presented 06/16/24 from home due to SOB. She also reports a fall causing edema in L index finger and R ahnd. PMH: HFpEF, CKD stage 3b, HTN, T2DM, COPD, Afib, CAD   OT comments  Pt making steady progress towards OT goals this session. Pt continues to present with increased pain and decreased activity tolerance impacting pts ability to complete ADLS independently . Session focus on BADL reeducation to improve overall activity tolerance. Pt currently requires MIN A for bed mobility and set- up for UB ADLS, pt declined LB ADLs. Pt limited by pain this session in L hip. Pt would continue to benefit from skilled occupational therapy while admitted and after d/c to address the below listed limitations in order to improve overall functional mobility and facilitate independence with BADL participation. DC plan remains appropriate, will follow acutely per POC.         If plan is discharge home, recommend the following:  A little help with walking and/or transfers;A little help with bathing/dressing/bathroom;Assistance with cooking/housework;Help with stairs or ramp for entrance   Equipment Recommendations  None recommended by OT    Recommendations for Other Services      Precautions / Restrictions Precautions Precautions: Fall Restrictions Weight Bearing Restrictions Per Provider Order: No       Mobility Bed Mobility Overal bed mobility: Needs Assistance Bed Mobility: Supine to Sit     Supine to sit: Min assist, HOB elevated     General bed mobility comments: MIN A to elevate trunk into sitting    Transfers                   General transfer comment: pt declined standing d/t pain     Balance Overall balance assessment: Needs assistance Sitting-balance support: No upper extremity supported, Feet  supported Sitting balance-Leahy Scale: Good Sitting balance - Comments: sat EOB to complete bathing with supervision                                   ADL either performed or assessed with clinical judgement   ADL Overall ADL's : Needs assistance/impaired     Grooming: Wash/dry face;Sitting;Set up Grooming Details (indicate cue type and reason): sitting EOB Upper Body Bathing: Contact guard assist;Sitting Upper Body Bathing Details (indicate cue type and reason): sitting EOB   Lower Body Bathing Details (indicate cue type and reason): pt declined LB bathing Upper Body Dressing : Set up;Sitting Upper Body Dressing Details (indicate cue type and reason): to don new gown from EOB                 Functional mobility during ADLs: Minimal assistance (bed mobility only) General ADL Comments: ADL participation impacted by increased pain and decreased activity tolerance    Extremity/Trunk Assessment Upper Extremity Assessment Upper Extremity Assessment: Overall WFL for tasks assessed   Lower Extremity Assessment Lower Extremity Assessment: Defer to PT evaluation   Cervical / Trunk Assessment Cervical / Trunk Assessment: Kyphotic    Vision Baseline Vision/History: 3 Glaucoma;1 Wears glasses;4 Cataracts Ability to See in Adequate Light: 0 Adequate Patient Visual Report: Other (comment) (blind in R eye)     Perception Perception Perception: Within Functional Limits   Praxis Praxis Praxis: [facility]   Communication Communication Communication: No  apparent difficulties   Cognition Arousal: Alert Behavior During Therapy: WFL for tasks assessed/performed, Agitated (moments of mild agitation when prompting pt to participate more) Cognition: No apparent impairments                               Following commands: Intact        Cueing   Cueing Techniques: Verbal cues  Exercises      Shoulder Instructions       General Comments pt on RA  during session, SpO2 WFL, pt recieved breathing treatment during session. pt reports she lives alone with her cats.    Pertinent Vitals/ Pain       Pain Assessment Pain Assessment: Faces Faces Pain Scale: Hurts little more Pain Location: L hip Pain Descriptors / Indicators: Discomfort, Guarding, Grimacing Pain Intervention(s): Monitored during session, Limited activity within patient's tolerance  Home Living                                          Prior Functioning/Environment              Frequency  Min 2X/week        Progress Toward Goals  OT Goals(current goals can now be found in the care plan section)  Progress towards OT goals: Progressing toward goals  Acute Rehab OT Goals Patient Stated Goal: to go home OT Goal Formulation: With patient Time For Goal Achievement: 07/01/24 Potential to Achieve Goals: Fair  Plan      Co-evaluation                 AM-PAC OT 6 Clicks Daily Activity     Outcome Measure   Help from another person eating meals?: None Help from another person taking care of personal grooming?: A Little Help from another person toileting, which includes using toliet, bedpan, or urinal?: A Little Help from another person bathing (including washing, rinsing, drying)?: A Lot Help from another person to put on and taking off regular upper body clothing?: A Little Help from another person to put on and taking off regular lower body clothing?: A Lot 6 Click Score: 17    End of Session    OT Visit Diagnosis: Unsteadiness on feet (R26.81);Other abnormalities of gait and mobility (R26.89);Muscle weakness (generalized) (M62.81);History of falling (Z91.81)   Activity Tolerance Patient tolerated treatment well   Patient Left in bed;with call bell/phone within reach;with bed alarm set   Nurse Communication Mobility status;Other (comment) (via secure chat)        Time: 9084-9066 OT Time Calculation (min): 18  min  Charges: OT General Charges $OT Visit: 1 Visit OT Treatments $Self Care/Home Management : 8-22 mins  Ronal Mallie POUR., COTA/L Acute Rehabilitation Services [phone]  Ronal Mallie Needy 06/21/2024, 11:30 AM

## 2024-06-21 NOTE — Assessment & Plan Note (Signed)
 On Eliquis  Noted to be Bradycardic

## 2024-06-21 NOTE — Assessment & Plan Note (Signed)
 Chronic renal function up from baseline Was treated with LAsix  with good urine out put  SOB improved  Kidney function still not at baseline Could benefit from nephrology involvement Repeat renal US  Will need to monitor renal function

## 2024-06-21 NOTE — Plan of Care (Signed)
 Hospital at Home Transfer Note   Jenny [name] FMW:[id number] DOB: 15-May-1933 DOA: 06/16/2024  PCP: Ileen Rosaline NOVAK, NP      Length of Stay 4  Referred by Attending Rojelio Nest, DO  Assessment: At this point patient does not meet inclusion criteria for Hospital at Home services Patient and Family declined   Brief Narrative:  89 y.o. female, with medical history significant of HFpEF, atrial fibrillation on Eliquis , COPD, hypertension, tachybradycardia syndrome, hyperlipidemia, CAD,   presents to the emergency department with worsening shortness of breath.  Patient reports ongoing dyspnea, progressive over the last few months to few weeks, but much worsened recently, she was seen by nephrology recently, where she was instructed to increase her oral diuretic, reports not much improvement of her symptoms.   In ED she had low-grade temperature 99.1, she had no hypoxia, but had significantly diminished air entry bilaterally, chest x-ray with no evidence of volume overload, her BNP was elevated at 227, but this is less than her baseline most recently at 441, high-sensitivity troponins elevated at 28, labs were significant for hypokalemia 2.8, and creatinine elevated at 1.9 from baseline of 1.5, patient received IV steroids, nebulizer treatment with improvement of her symptoms, [facility] consulted to admit.   Current consultants: Was seen by Cardiology now signed off    Consult Orders  (From admission, onward)           Start     Ordered   06/16/24 1740  PT eval and treat  Routine        06/16/24 1739   06/16/24 1740  OT eval and treat  Routine        06/16/24 1739             Assessment & Plan:   Principal Problem:   COPD exacerbation (HCC) Active Problems:   Tachycardia-bradycardia syndrome (HCC)   Essential hypertension   Stage 3b chronic kidney disease (HCC)   COPD (chronic obstructive pulmonary disease) (HCC)   Obese   Persistent atrial fibrillation  (HCC)   Heart failure (HCC)   Type II diabetes mellitus with peripheral circulatory disorder (HCC)   SOB (shortness of breath)     Tachycardia-bradycardia syndrome (HCC) Chronic , pt states HR are in 30-50 at baseline  Coronary artery disease involving native coronary artery of native heart without angina pectoris On Crestor  21m gp o q day   Stage 3b chronic kidney disease (HCC) Chronic renal function up from baseline Was treated with LAsix  with good urine out put  SOB improved  Kidney function still not at baseline Could benefit from nephrology involvement Repeat renal US  Will need to monitor renal function   Type 2 diabetes, diet controlled (HCC) Diet controlled  Normocytic anemia Stable Hg 7/10 Would repeat if pt is transferred to H@H   HLD (hyperlipidemia) On Crestor   PAF (paroxysmal atrial fibrillation) (HCC) On Eliquis  Noted to be Bradycardic  COPD exacerbation (HCC) Continue on Prednisone  Albuterol  nebs PRN  If pt agrees  Other secondary pulmonary hypertension (HCC) May be contributing to dyspnea And increased work of breathing  Right hip pain May benefit from imaging  And pain control  Will need PT OT re eval given new pain     Diet  Diet Orders (From admission, onward)     Start     Ordered   06/19/24 0905  Diet heart healthy/carb modified Room service appropriate? Yes; Fluid consistency: Thin; Fluid restriction: 2000 mL Fluid  Diet effective now  Question Answer Comment  Diet-HS Snack? Nothing   Room service appropriate? Yes   Fluid consistency: Thin   Fluid restriction: 2000 mL Fluid      06/19/24 0904             DVT prophylaxis:  on   apixaban  (ELIQUIS ) tablet 2.5 mg Start: 06/16/24 2200 apixaban  (ELIQUIS ) tablet 2.5 mg    Code Status:    Code Status: Limited: Do not attempt resuscitation (DNR) -DNR-LIMITED -Do Not Intubate/DNI   Family Communication:   Family not at  Bedside  plan of care was discussed on the phone  with  Son, [facility] at [facility] program has been discussed with family   Family expresses reservations regarding hospital home program.  Son feels he does not have a full understanding of why patient continues to have shortness of breath or the plan of treatment.  Family states they would like to discuss case father with an attending physician  Admission status:  ED Disposition     ED Disposition  Admit   Condition  --   Comment  Hospital Area: [facility] [[id number]]  Level of Care: Progressive [102]  Admit to Progressive based on following criteria: RESPIRATORY PROBLEMS hypoxemic/hypercapnic respiratory failure that is responsive to NIPPV (BiPAP) or High Flow Nasal Cannula (6-80 lpm). Frequent assessment/intervention, no > Q2 hrs < Q4 hrs, to maintain oxygenation and pulmonary hygiene.  May place patient in observation at [facility] or [facility] if equivalent level of care is available:: No  Covid Evaluation: Symptomatic Person Under Investigation (PUI) or recent exposure (last 10 days) *Testing Required*  Diagnosis: COPD exacerbation [facility]) [[id number]]  Admitting Physician: SHERLON BRAYTON GORMAN RANDA  Attending Physician: SHERLON BRAYTON GORMAN [[id number]]  Bed request comments: 5 west  For patients discharging to extended facilities (i.e. SNF, AL, group homes or LTAC) initiate:: Discharge to SNF/Facility Placement COVID-19 Lab Testing Protocol         Patient does meet  inpatient   criteria  To be admitted to the Hospital at [facility] program, a patient generally must meet the following:  I Expect 2 midnight stay secondary to severity of patient's current illness need for inpatient interventions justified by the following:   extensive comorbidities including: DM2  CHF CAD  COPD/asthma  Obesity CKD Chronic anticoagulation  That are currently affecting medical management. I expect  patient to be hospitalized for 2 midnights requiring inpatient medical care.    1.  Requirement for Inpatient Level of Care has been reviewed: The patient's condition must necessitate an inpatient level of care. This is typically indicated by one or more of the following, depending on their specific diagnosis:  -Persistent tachycardia despite appropriate treatment (e.g., for Heart Failure, UTI).  - Persistent tachypnea (rapid breathing) or dyspnea (shortness of breath) that hasn't improved sufficiently with observation care (e.g., for Heart Failure, Pneumonia, Viral Illness, COVID).  - Hypoxemia (low oxygen levels), such as a new need for oxygen, an increased need from baseline, or specific oxygen saturation levels (e.g., SpO2 <90-94% depending on the condition) that persist despite observation (e.g., for Heart Failure, COPD, Pneumonia, Viral Illness, COVID).  - Need for Intravenous (IV) hydration due to an inability to maintain oral hydration, which persists despite observation care (e.g., for Cellulitis, UTI, Viral Illness, COVID).  - Specific to Heart Failure: Persistent pulmonary edema, indicated by a new oxygen need, lack of improvement with IV diuretics, and ongoing tachypnea/dyspnea.  - Specific to COPD: A decrease in known baseline resting oxygen saturation (  SpO2) by 4% or more, or an increase in pre-existing supplemental oxygen requirements, which persists despite observation and requires continued close monitoring.  - Specific to Pneumonia: A Pneumonia Severity Index (PSI) class IV (moderate risk).  - Specific to Cellulitis: Failure of outpatient antibiotic therapy (indicated by progression or no improvement after a minimum of 48 hours on an adequate regimen) or a clinical presentation (like acuity or rapidity of progression) that requires the intensity of monitoring found in an inpatient setting.  - Specific to UTI: Persistence or worsening of clinical findings like fever, pain, or dehydration despite observation care; presence of significant uropathy;  suspected infection of an indwelling prosthetic device, stent, implant, or graft; or pregnancy with suspected pyelonephritis.  _______________________________________________________________________________________________________  Hospital at Home Admission Criteria Checklist   Patient meets inpatient admission criteria    pt medicare FFS    Patient lives within 80 mil/ 30 min from [facility] within [place](pt may stay with family member during admission who lives within 25 miles or 30 min from [facility])     Hemodynamically stable with relatively low risk of clinical deterioration-not requiring ICU     Age >18    Does not require frequent touch-points or complex interventions/medications (ie Titrated Infusions (IV insulin , heparin drips, vasoactive drips, use of infused or injectable controlled substances, patients on insulin )?   Any Behavioral Health comorbidities likely to increase risk for in-home care (ie Acute delirium or experiencing a marked altered mental status and cause is not a treatable condition in the home)?   Currently  active safety concerns lacks caregiver support for safety  Patient at this time has severe hip pain that limits her mobility Family support system in place?   Son and Daughter live in the area but at this time patient  feels they would not be able to help her      Subjective:    Patient was seen and examined Discussed with patient and family hospital at home program they are reluctant to participate at this time    Objective: Vitals:   06/21/24 0023 06/21/24 0300 06/21/24 0423 06/21/24 0833  BP: 101/63  (!) 157/94 (!) 173/79  Pulse: 61  (!) 57 (!) 52  Resp: 13 15 15 16   Temp: 98 F (36.7 C)  97.6 F (36.4 C) (!) 97.5 F (36.4 C)  TempSrc: Oral Oral Oral Axillary  SpO2: 95%  94% 96%  Weight:      Height:        Intake/Output Summary (Last 24 hours) at 06/21/2024 0837 Last data filed at 06/20/2024 1610 Gross per 24 hour  Intake  240 ml  Output 800 ml  Net -560 ml   Filed Weights   06/16/24 1411 06/16/24 1835 06/17/24 0555  Weight: 88.9 kg 88.6 kg 90 kg    Examination:  General exam: Appears calm and comfortable  Respiratory system:   Respiratory effort normal. Cardiovascular system: S1 & S2 heard, RRR. No JVD, murmurs, rubs, gallops or clicks. No pedal edema. Gastrointestinal system: Abdomen is nondistended, soft and nontender. No organomegaly or masses felt. Normal bowel sounds heard. Central nervous system: Alert and oriented. No focal neurological deficits. Extremities: Symmetric 5 x 5 power. Skin: No rashes, lesions or ulcers Psychiatry: Judgement and insight appear normal. Mood & affect appropriate.   Left groin rash noted   Data Reviewed: I have personally reviewed following labs and imaging studies  CBC: Recent Labs  Lab 06/16/24 1441 06/17/24 0410  WBC 8.7 8.2  NEUTROABS  7.1  --   HGB 11.6* 11.4*  HCT 35.3* 35.5*  MCV 92.2 92.0  PLT 300 317    Basic Metabolic Panel: Recent Labs  Lab 06/16/24 1958 06/17/24 0410 06/17/24 1440 06/18/24 0434 06/19/24 0430 06/20/24 0456 06/21/24 0520  NA  --    < > 134* 136 134* 135 137  K  --    < > 4.0 3.5 5.0 4.7 4.3  CL  --    < > 97* 98 97* 98 100  CO2  --    < > 23 24 25 25 27   GLUCOSE  --    < > 226* 188* 268* 196* 138*  BUN  --    < > 61* 61* 61* 65* 62*  CREATININE  --    < > 2.38* 2.38* 2.43* 2.46* 2.00*  CALCIUM   --    < > 8.0* 8.2* 8.7* 8.6* 8.2*  MG 1.7  --   --   --   --   --   --   PHOS 4.0  --  3.8  --   --   --   --    < > = values in this interval not displayed.    GFR: Estimated Creatinine Clearance: 20.3 mL/min (A) (by C-G formula based on SCr of 2 mg/dL (H)).  Liver Function Tests: Recent Labs  Lab 06/16/24 1539 06/17/24 1440  AST 17  --   ALT 10  --   ALKPHOS 71  --   BILITOT 0.7  --   PROT 6.2*  --   ALBUMIN 2.8* 3.0*    CBG: No results for input(s): GLUCAP in the last 168  hours.   __________ Procedures:    Recent Results (from the past 56199 hours)  ECHOCARDIOGRAM COMPLETE   Collection Time: 06/18/24  2:51 PM  Result Value   Weight 3,174.4   Height 62   BP 123/86   S' Lateral 2.80   AR max vel 2.83   AV Area VTI 2.56   AV Mean grad 5.0   AV Peak grad 10.6   Ao pk vel 1.63   Area-P 1/2 2.41   AV Area mean vel 2.72   Est EF 60 - 65%   Narrative      ECHOCARDIOGRAM REPORT      Jenny [name]  IMPRESSIONS    1. Left ventricular ejection fraction, by estimation, is 60 to 65%. The left ventricle has normal function. The left ventricle has no regional wall motion abnormalities. There is moderate asymmetric left ventricular hypertrophy of the basal-septal  segment. Left ventricular diastolic parameters are indeterminate.  2. Right ventricular systolic function is normal. The right ventricular size is mildly enlarged. There is mildly elevated pulmonary artery systolic pressure. The estimated right ventricular systolic pressure is 43.7 mmHg.  3. Left atrial size was moderately dilated.  4. Right atrial size was moderately dilated.  5. The mitral valve is degenerative. Trivial mitral valve regurgitation. Moderate mitral stenosis. Severe mitral annular calcification. MG , MVA 1.4cm^2 by continuity equation.  6. The aortic valve is tricuspid. Aortic valve regurgitation is not visualized. Aortic valve sclerosis/calcification is present, without any evidence of aortic stenosis.           DM  labs:  HbA1C: Recent Labs    09/26/23 1731 06/18/24 0434  HGBA1C 6.9* 6.6*       CBG (last 3)  No results for input(s): GLUCAP in the last 72 hours.  Cultures:    Component Value Date/Time   SDES BLOOD RIGHT ANTECUBITAL 08/22/2021 1621   SPECREQUEST  08/22/2021 1621    BOTTLES DRAWN AEROBIC AND ANAEROBIC Blood Culture adequate volume   CULT  08/22/2021 1621    NO GROWTH 5 DAYS Performed at [facility], [address]  [place] [place], [place] [address]    REPTSTATUS 08/27/2021 FINAL 08/22/2021 1621     Radiological Exams on Admission: DG HIP UNILAT WITH PELVIS 2-3 VIEWS RIGHT Result Date: 06/21/2024 CLINICAL DATA:  Right hip pain.  No trauma history is submitted. EXAM: DG HIP (WITH OR WITHOUT PELVIS) 2-3V RIGHT COMPARISON:  None Available. FINDINGS: Images are degraded by patient body habitus and overlying pannus. No fracture or dislocation. Moderate bilateral hip joint space narrowing, slightly greater left than right. Vascular calcifications. IMPRESSION: Degenerative change, without acute osseous finding. Limitations secondary to patient body habitus. Electronically Signed   By: Rockey Kilts M.D.   On: 06/21/2024 14:10   Recent Results (from the past 240 hours)  Resp panel by RT-PCR (RSV, Flu A&B, Covid) Anterior Nasal Swab     Status: None   Collection Time: 06/16/24  4:35 PM   Specimen: Anterior Nasal Swab  Result Value Ref Range Status   SARS Coronavirus 2 by RT PCR NEGATIVE NEGATIVE Final   Influenza A by PCR NEGATIVE NEGATIVE Final   Influenza B by PCR NEGATIVE NEGATIVE Final         Resp Syncytial Virus by PCR NEGATIVE NEGATIVE Final            Scheduled Meds:  apixaban   2.5 mg Oral BID   arformoterol   15 mcg Nebulization BID   dorzolamide   1 drop Both Eyes BID   gabapentin   100 mg Oral q morning   gabapentin   200 mg Oral QHS   ipratropium-albuterol   3 mL Nebulization TID   latanoprost   1 drop Both Eyes q AM   pantoprazole   40 mg Oral Daily   predniSONE   50 mg Oral Q breakfast   revefenacin   175 mcg Nebulization Daily   rosuvastatin   20 mg Oral Daily       LOS: 4 days    Time spent: >120 minutes  Thank you for allowing us  to participate in Jenny [name] care. Please don't hesitate to reach back out as needed    Blease Quiver, MD [facility]   06/21/2024, 3:30 PM

## 2024-06-21 NOTE — Assessment & Plan Note (Signed)
 May be contributing to dyspnea And increased work of breathing

## 2024-06-21 NOTE — Assessment & Plan Note (Signed)
 On Crestor

## 2024-06-21 NOTE — Progress Notes (Addendum)
 1040 I took over care for this patient she's alert x4 on room air all 8am and 10am meds passed at this time, currently waiting on transport for xray of right hip

## 2024-06-21 NOTE — Assessment & Plan Note (Signed)
 On Crestor  37m gp o q day

## 2024-06-21 NOTE — Assessment & Plan Note (Signed)
 Diet controlled.

## 2024-06-21 NOTE — Assessment & Plan Note (Signed)
 Stable Hg 7/10 Would repeat if pt is transferred to H@H 

## 2024-06-21 NOTE — Plan of Care (Signed)
  Problem: Education: Goal: Knowledge of General Education information will improve Description: Including pain rating scale, medication(s)/side effects and non-pharmacologic comfort measures Outcome: Progressing   Problem: Health Behavior/Discharge Planning: Goal: Ability to manage health-related needs will improve Outcome: Progressing   Problem: Clinical Measurements: Goal: Ability to maintain clinical measurements within normal limits will improve Outcome: Progressing Goal: Will remain free from infection Outcome: Progressing Goal: Diagnostic test results will improve Outcome: Progressing Goal: Respiratory complications will improve Outcome: Progressing Goal: Cardiovascular complication will be avoided Outcome: Progressing   Problem: Activity: Goal: Risk for activity intolerance will decrease Outcome: Progressing   Problem: Nutrition: Goal: Adequate nutrition will be maintained Outcome: Progressing   Problem: Coping: Goal: Level of anxiety will decrease Outcome: Progressing   Problem: Elimination: Goal: Will not experience complications related to bowel motility Outcome: Progressing Goal: Will not experience complications related to urinary retention Outcome: Progressing   Problem: Pain Managment: Goal: General experience of comfort will improve and/or be controlled Outcome: Progressing   Problem: Safety: Goal: Ability to remain free from injury will improve Outcome: Progressing   Problem: Skin Integrity: Goal: Risk for impaired skin integrity will decrease Outcome: Not Progressing   Problem: Education: Goal: Knowledge of disease or condition will improve Outcome: Progressing Goal: Knowledge of the prescribed therapeutic regimen will improve Outcome: Progressing Goal: Individualized Educational Video(s) Outcome: Progressing   Problem: Activity: Goal: Ability to tolerate increased activity will improve Outcome: Progressing Goal: Will verbalize the  importance of balancing activity with adequate rest periods Outcome: Progressing   Problem: Respiratory: Goal: Ability to maintain a clear airway will improve Outcome: Progressing Goal: Levels of oxygenation will improve Outcome: Progressing Goal: Ability to maintain adequate ventilation will improve Outcome: Progressing   Problem: Clinical Measurements: Goal: Ability to avoid or minimize complications of infection will improve Outcome: Progressing   Problem: Skin Integrity: Goal: Skin integrity will improve Outcome: Progressing   Problem: Clinical Measurements: Goal: Ability to avoid or minimize complications of infection will improve Outcome: Progressing   Problem: Skin Integrity: Goal: Skin integrity will improve Outcome: Progressing

## 2024-06-21 NOTE — Progress Notes (Addendum)
 [facility] night cross cover note:   I was notified by the patient's RN  that the patient is complaining of right upper thigh/right groin pain that has not improved with does of acetaminophen  per existing prn order.  The patient specifically requests Tylenol  3 to help further address this discomfort.  I subsequently placed order for prn Tylenol  3 (300/30 mg) po q6h prn for pain to help further attend to her right groin/right upper thigh pain.    Update: in the setting of residual pain after dose of Tylenol  3, I've placed order for a one-time additional dose of Tylenol  3.     Eva Pore, DO Hospitalist

## 2024-06-21 NOTE — Progress Notes (Addendum)
 PROGRESS NOTE    Jenny [name]  FMW:[id number] DOB: 1933/11/19 DOA: 06/16/2024 PCP: Ileen Rosaline NOVAK, NP     Brief Narrative:  Jenny [name]  is a 89 y.o. female, with medical history significant of HFpEF, atrial fibrillation on Eliquis , COPD, hypertension, tachybradycardia syndrome, hyperlipidemia, CAD, presents to the emergency department with worsening shortness of breath.  Patient reports ongoing dyspnea, progressive over the last few months to few weeks, but much worsened recently, she was seen by nephrology recently, where she was instructed to increase her oral diuretic, reports not much improvement of her symptoms.  In ED she had low-grade temperature 99.1, she had no hypoxia, but had significantly diminished air entry bilaterally, chest x-ray with no evidence of volume overload, her BNP was elevated at 227, but this is less than her baseline most recently at 441, high-sensitivity troponins elevated at 28, labs were significant for hypokalemia 2.8, and creatinine elevated at 1.9 from baseline of 1.5, patient received IV steroids, nebulizer treatment with improvement of her symptoms, [facility] hospitalist consulted to admit.  New events last 24 hours / Subjective: States that lasix  helped with her breathing. She feels less swollen as well. However, she admits to severe right hip pain. Admits that this has been ongoing for several days, but hasn't mentioned to me until today. Also with pretty significant yeast infection under her breast and groin.   Assessment & Plan:   Principal Problem:   COPD exacerbation (HCC) Active Problems:   Tachycardia-bradycardia syndrome (HCC)   Coronary artery disease involving native coronary artery of native heart without angina pectoris   Normocytic anemia   Essential hypertension   Stage 3b chronic kidney disease (HCC)   Type 2 diabetes, diet controlled (HCC)   COPD (chronic obstructive pulmonary disease) (HCC)   HLD (hyperlipidemia)   PAF (paroxysmal  atrial fibrillation) (HCC)   Obese   Persistent atrial fibrillation (HCC)   Heart failure (HCC)   Type II diabetes mellitus with peripheral circulatory disorder (HCC)   Other secondary pulmonary hypertension (HCC)   SOB (shortness of breath)   Right hip pain   Dyspnea on exertion, multifactorial with COPD exacerbation and diastolic CHF - COVID/influenza/RSV negative - Neb tx, solumedrol --> prednisone    Acute on chronic diastolic CHF  - BNP mildly elevated 227.9 and chest x-ray did not show vascular congestion, however she admits to dependent fluid and significant weight gain - Followed by Dr. Verlin outpatient - Torsemide  and Zaroxolyn  currently on hold due to worsening renal function - Cardiology consulted, signed off 7/12 - Echocardiogram EF 60-65%  - 7/13: After discussion with patient regarding benefits and risk of IV diuretic use and monitoring kidney function, we decided to trial 1 dose of IV Lasix  to see if she would have any improvement in her respiratory status. Her dry weight in the office was 195 pounds.  This morning her weight was 198 pounds here - 7/14: Breathing and swelling improved. Decided to trial another 1 dose of IV lasix  today   AKI on CKD stage IIIb - Baseline creatinine 1.4.   - Admitted with creatinine 1.9 --> 2.76 --> 2.38 --> 2.43 --> 2.46 --> 2.0  - Monitor closely on Lasix    Right hip pain - Check xray  Extensive cutaneous candidiasis with developing cellulitis under breast - Nystatin  powder - Cefadroxil    Demand ischemia - In setting of above.  Troponin 28, 27  Paroxysmal A-fib - Eliquis   Hyperlipidemia - Crestor    Diabetes mellitus - A1c 6.6 - Refusing  CBG checks   DVT prophylaxis:  apixaban  (ELIQUIS ) tablet 2.5 mg Start: 06/16/24 2200 apixaban  (ELIQUIS ) tablet 2.5 mg  Code Status: DNR Family Communication: None at bedside; updated son over the phone Disposition Plan: Home Status is: Inpatient Remains inpatient appropriate  because: IV lasix      Antimicrobials:  Anti-infectives (From admission, onward)    None        Objective: Vitals:   06/21/24 0300 06/21/24 0423 06/21/24 0833 06/21/24 1216  BP:  (!) 157/94 (!) 173/79 (!) 150/71  Pulse:  (!) 57 (!) 52 68  Resp: 15 15 16 20   Temp:  97.6 F (36.4 C) (!) 97.5 F (36.4 C) (!) 97.5 F (36.4 C)  TempSrc: Oral Oral Axillary Oral  SpO2:  94% 96% 96%  Weight:      Height:        Intake/Output Summary (Last 24 hours) at 06/21/2024 1305 Last data filed at 06/20/2024 1610 Gross per 24 hour  Intake --  Output 800 ml  Net -800 ml   Filed Weights   06/16/24 1411 06/16/24 1835 06/17/24 0555  Weight: 88.9 kg 88.6 kg 90 kg    Examination:  General exam: Appears calm and comfortable  Respiratory system: Diminished breath sounds, no conversational dyspnea today, on room air  Cardiovascular system: S1 & S2 heard, RRR. No murmurs.  Nonpitting pedal edema. Gastrointestinal system: Abdomen is nondistended, soft  Central nervous system: Alert and oriented. No focal neurological deficits. Speech clear.  Extremities: Symmetric in appearance  Skin: Dry skin of her legs, +erythema under her breast and groin, under breast is worse compared to under groin  Psychiatry: Judgement and insight appear normal. Mood & affect appropriate.    Data Reviewed: I have personally reviewed following labs and imaging studies  CBC: Recent Labs  Lab 06/16/24 1441 06/17/24 0410  WBC 8.7 8.2  NEUTROABS 7.1  --   HGB 11.6* 11.4*  HCT 35.3* 35.5*  MCV 92.2 92.0  PLT 300 317   Basic Metabolic Panel: Recent Labs  Lab 06/16/24 1958 06/17/24 0410 06/17/24 1440 06/18/24 0434 06/19/24 0430 06/20/24 0456 06/21/24 0520  NA  --    < > 134* 136 134* 135 137  K  --    < > 4.0 3.5 5.0 4.7 4.3  CL  --    < > 97* 98 97* 98 100  CO2  --    < > 23 24 25 25 27   GLUCOSE  --    < > 226* 188* 268* 196* 138*  BUN  --    < > 61* 61* 61* 65* 62*  CREATININE  --    < > 2.38* 2.38*  2.43* 2.46* 2.00*  CALCIUM   --    < > 8.0* 8.2* 8.7* 8.6* 8.2*  MG 1.7  --   --   --   --   --   --   PHOS 4.0  --  3.8  --   --   --   --    < > = values in this interval not displayed.   GFR: Estimated Creatinine Clearance: 20.3 mL/min (A) (by C-G formula based on SCr of 2 mg/dL (H)). Liver Function Tests: Recent Labs  Lab 06/16/24 1539 06/17/24 1440  AST 17  --   ALT 10  --   ALKPHOS 71  --   BILITOT 0.7  --   PROT 6.2*  --   ALBUMIN 2.8* 3.0*   No results for input(s): LIPASE, AMYLASE in  the last 168 hours. No results for input(s): AMMONIA in the last 168 hours. Coagulation Profile: No results for input(s): INR, PROTIME in the last 168 hours. Cardiac Enzymes: No results for input(s): CKTOTAL, CKMB, CKMBINDEX, TROPONINI in the last 168 hours. BNP (last 3 results) No results for input(s): PROBNP in the last 8760 hours. HbA1C: No results for input(s): HGBA1C in the last 72 hours.  CBG: No results for input(s): GLUCAP in the last 168 hours. Lipid Profile: No results for input(s): CHOL, HDL, LDLCALC, TRIG, CHOLHDL, LDLDIRECT in the last 72 hours. Thyroid  Function Tests: No results for input(s): TSH, T4TOTAL, FREET4, T3FREE, THYROIDAB in the last 72 hours. Anemia Panel: No results for input(s): VITAMINB12, FOLATE, FERRITIN, TIBC, IRON , RETICCTPCT in the last 72 hours. Sepsis Labs: No results for input(s): PROCALCITON, LATICACIDVEN in the last 168 hours.  Recent Results (from the past 240 hours)  Resp panel by RT-PCR (RSV, Flu A&B, Covid) Anterior Nasal Swab     Status: None   Collection Time: 06/16/24  4:35 PM   Specimen: Anterior Nasal Swab  Result Value Ref Range Status   SARS Coronavirus 2 by RT PCR NEGATIVE NEGATIVE Final   Influenza A by PCR NEGATIVE NEGATIVE Final   Influenza B by PCR NEGATIVE NEGATIVE Final    Comment: (NOTE) The Xpert Xpress SARS-CoV-2/FLU/RSV plus assay is intended as an aid in  the diagnosis of influenza from Nasopharyngeal swab specimens and should not be used as a sole basis for treatment. Nasal washings and aspirates are unacceptable for Xpert Xpress SARS-CoV-2/FLU/RSV testing.  Fact Sheet for Patients: BloggerCourse.com  Fact Sheet for Healthcare Providers: SeriousBroker.it  This test is not yet approved or cleared by the [place]  FDA and has been authorized for detection and/or diagnosis of SARS-CoV-2 by FDA under an Emergency Use Authorization (EUA). This EUA will remain in effect (meaning this test can be used) for the duration of the COVID-19 declaration under Section 564(b)(1) of the Act, 21 U.S.C. section 360bbb-3(b)(1), unless the authorization is terminated or revoked.     Resp Syncytial Virus by PCR NEGATIVE NEGATIVE Final    Comment: (NOTE) Fact Sheet for Patients: BloggerCourse.com  Fact Sheet for Healthcare Providers: SeriousBroker.it  This test is not yet approved or cleared by the [place]  FDA and has been authorized for detection and/or diagnosis of SARS-CoV-2 by FDA under an Emergency Use Authorization (EUA). This EUA will remain in effect (meaning this test can be used) for the duration of the COVID-19 declaration under Section 564(b)(1) of the Act, 21 U.S.C. section 360bbb-3(b)(1), unless the authorization is terminated or revoked.  Performed at [facility], [address], [place], [place] [address]       Radiology Studies: No results found.     Scheduled Meds:  apixaban   2.5 mg Oral BID   arformoterol   15 mcg Nebulization BID   dorzolamide   1 drop Both Eyes BID   gabapentin   100 mg Oral q morning   gabapentin   200 mg Oral QHS   ipratropium-albuterol   3 mL Nebulization BID   latanoprost   1 drop Both Eyes q AM   nystatin    Topical TID   pantoprazole   40 mg Oral Daily   predniSONE   50 mg Oral Q  breakfast   revefenacin   175 mcg Nebulization Daily   rosuvastatin   20 mg Oral Daily   Continuous Infusions:   LOS: 4 days   Time spent: 35 minutes   Delon Hoe, DO [facility] 06/21/2024, 1:05 PM  Available via Epic secure chat 7am-7pm After these hours, please refer to coverage provider listed on [email or web address]

## 2024-06-21 NOTE — Assessment & Plan Note (Signed)
 May benefit from imaging  And pain control  Will need PT OT re eval given new pain

## 2024-06-21 NOTE — Subjective & Objective (Addendum)
 presents to the emergency department with worsening shortness of breath.  Patient reports ongoing dyspnea, progressive over the last few months to few weeks, but much worsened recently, she was seen by nephrology recently, where she was instructed to increase her oral diuretic, reports not much improvement of her symptoms.   In ED she had low-grade temperature 99.1, she had no hypoxia, but had significantly diminished air entry bilaterally, chest x-ray with no evidence of volume overload, her BNP was elevated at 227, but this is less than her baseline most recently at 441, high-sensitivity troponins elevated at 28, labs were significant for hypokalemia 2.8, and creatinine elevated at 1.9 from baseline of 1.5, patient received IV steroids, nebulizer treatment with improvement of her symptoms, [facility] hospitalist consulted to admit.

## 2024-06-22 ENCOUNTER — Other Ambulatory Visit (HOSPITAL_COMMUNITY): Payer: Self-pay

## 2024-06-22 DIAGNOSIS — J441 Chronic obstructive pulmonary disease with (acute) exacerbation: Secondary | ICD-10-CM | POA: Diagnosis not present

## 2024-06-22 LAB — BASIC METABOLIC PANEL WITH GFR
Anion gap: 13 (ref 5–15)
BUN: 61 mg/dL — ABNORMAL HIGH (ref 8–23)
CO2: 28 mmol/L (ref 22–32)
Calcium: 8.4 mg/dL — ABNORMAL LOW (ref 8.9–10.3)
Chloride: 96 mmol/L — ABNORMAL LOW (ref 98–111)
Creatinine, Ser: 1.85 mg/dL — ABNORMAL HIGH (ref 0.44–1.00)
GFR, Estimated: 26 mL/min — ABNORMAL LOW (ref >60)
Glucose, Bld: 150 mg/dL — ABNORMAL HIGH (ref 70–99)
Potassium: 4.9 mmol/L (ref 3.5–5.1)
Sodium: 137 mmol/L (ref 135–145)

## 2024-06-22 MED ORDER — FUROSEMIDE 10 MG/ML IJ SOLN
40.0000 mg | Freq: Once | INTRAMUSCULAR | Status: AC
  Administered 2024-06-22: 40 mg via INTRAVENOUS
  Filled 2024-06-22: qty 4

## 2024-06-22 MED ORDER — PREDNISONE 20 MG PO TABS
40.0000 mg | ORAL_TABLET | Freq: Every day | ORAL | Status: DC
  Administered 2024-06-23 – 2024-06-24 (×2): 40 mg via ORAL
  Filled 2024-06-22 (×2): qty 2

## 2024-06-22 NOTE — Plan of Care (Signed)
  Problem: Education: Goal: Knowledge of General Education information will improve Description Including pain rating scale, medication(s)/side effects and non-pharmacologic comfort measures Outcome: Progressing   Problem: Clinical Measurements: Goal: Will remain free from infection Outcome: Progressing   Problem: Nutrition: Goal: Adequate nutrition will be maintained Outcome: Progressing   Problem: Coping: Goal: Level of anxiety will decrease Outcome: Progressing   Problem: Elimination: Goal: Will not experience complications related to bowel motility Outcome: Progressing

## 2024-06-22 NOTE — Progress Notes (Signed)
 PT Cancellation Note  Patient Details Name: Jenny [name] MRN: [id number] DOB: 1933/08/23   Cancelled Treatment:    Reason Eval/Treat Not Completed: Patient declined, no reason specified (pt refused mobility stating she can't move on lasix . offered brief and moving within line of purewick tubing but pt continued to refuse and state she may get up in PM. Educated for importance of continuing to move for return home)   Lenoard NOVAK Delaney Perona 06/22/2024, 11:23 AM Lenoard SQUIBB, PT Acute Rehabilitation Services Office: [phone]

## 2024-06-22 NOTE — Plan of Care (Signed)
  Problem: Education: Goal: Knowledge of General Education information will improve Description: Including pain rating scale, medication(s)/side effects and non-pharmacologic comfort measures Outcome: Progressing   Problem: Health Behavior/Discharge Planning: Goal: Ability to manage health-related needs will improve Outcome: Progressing   Problem: Clinical Measurements: Goal: Ability to maintain clinical measurements within normal limits will improve Outcome: Progressing Goal: Will remain free from infection Outcome: Progressing Goal: Diagnostic test results will improve Outcome: Progressing Goal: Respiratory complications will improve Outcome: Progressing Goal: Cardiovascular complication will be avoided Outcome: Progressing   Problem: Activity: Goal: Risk for activity intolerance will decrease Outcome: Progressing   Problem: Nutrition: Goal: Adequate nutrition will be maintained Outcome: Progressing   Problem: Coping: Goal: Level of anxiety will decrease Outcome: Progressing   Problem: Elimination: Goal: Will not experience complications related to bowel motility Outcome: Progressing Goal: Will not experience complications related to urinary retention Outcome: Progressing   Problem: Pain Managment: Goal: General experience of comfort will improve and/or be controlled Outcome: Progressing   Problem: Safety: Goal: Ability to remain free from injury will improve Outcome: Progressing   Problem: Skin Integrity: Goal: Risk for impaired skin integrity will decrease Outcome: Progressing   Problem: Education: Goal: Knowledge of disease or condition will improve Outcome: Progressing Goal: Knowledge of the prescribed therapeutic regimen will improve Outcome: Progressing Goal: Individualized Educational Video(s) Outcome: Progressing   Problem: Activity: Goal: Ability to tolerate increased activity will improve Outcome: Progressing Goal: Will verbalize the  importance of balancing activity with adequate rest periods Outcome: Progressing   Problem: Respiratory: Goal: Ability to maintain a clear airway will improve Outcome: Progressing Goal: Levels of oxygenation will improve Outcome: Progressing Goal: Ability to maintain adequate ventilation will improve Outcome: Progressing   Problem: Clinical Measurements: Goal: Ability to avoid or minimize complications of infection will improve Outcome: Progressing   Problem: Skin Integrity: Goal: Skin integrity will improve Outcome: Progressing   Problem: Clinical Measurements: Goal: Ability to avoid or minimize complications of infection will improve Outcome: Progressing   Problem: Skin Integrity: Goal: Skin integrity will improve Outcome: Progressing

## 2024-06-22 NOTE — Progress Notes (Signed)
 PROGRESS NOTE    MADELINE PHO  FMW:[id number] DOB: Jul 29, 1933 DOA: 06/16/2024 PCP: Ileen Rosaline NOVAK, NP     Brief Narrative:  Jenny [name]  is a 89 y.o. female, with medical history significant of HFpEF, atrial fibrillation on Eliquis , COPD, hypertension, tachybradycardia syndrome, hyperlipidemia, CAD, presents to the emergency department with worsening shortness of breath.  Patient reports ongoing dyspnea, progressive over the last few months to few weeks, but much worsened recently, she was seen by nephrology recently, where she was instructed to increase her oral diuretic, reports not much improvement of her symptoms.  In ED she had low-grade temperature 99.1, she had no hypoxia, but had significantly diminished air entry bilaterally, chest x-ray with no evidence of volume overload, her BNP was elevated at 227, but this is less than her baseline most recently at 441, high-sensitivity troponins elevated at 28, labs were significant for hypokalemia 2.8, and creatinine elevated at 1.9 from baseline of 1.5, patient received IV steroids, nebulizer treatment with improvement of her symptoms, [facility] consulted to admit.  Patient was treated for COPD exacerbation with steroids and neb treatments.  Cardiology consulted for CHF, did not feel she was volume overloaded.  They signed off 7/12.  Due to continued patient's symptoms of dyspnea with exertion, swelling, IV Lasix  was started 7/13 with clinical improvement.  New events last 24 hours / Subjective: Continues to have some right-sided hip pain.  We discussed her x-ray results and continued PT.  Breathing slightly improved, no conversational dyspnea on exam today.  Urine output recorded 2550 mL yesterday.  Willing to trial 1 more dose of Lasix  prior to discharge potentially tomorrow  Assessment & Plan:   Principal Problem:   COPD exacerbation (HCC) Active Problems:   Tachycardia-bradycardia syndrome (HCC)   Coronary artery disease  involving native coronary artery of native heart without angina pectoris   Normocytic anemia   Essential hypertension   Stage 3b chronic kidney disease (HCC)   Type 2 diabetes, diet controlled (HCC)   COPD (chronic obstructive pulmonary disease) (HCC)   HLD (hyperlipidemia)   PAF (paroxysmal atrial fibrillation) (HCC)   Obese   Persistent atrial fibrillation (HCC)   Heart failure (HCC)   Type II diabetes mellitus with peripheral circulatory disorder (HCC)   Other secondary pulmonary hypertension (HCC)   SOB (shortness of breath)   Right hip pain   Dyspnea on exertion, multifactorial with COPD exacerbation and diastolic CHF - COVID/influenza/RSV negative - Neb tx, solumedrol --> prednisone    Acute on chronic diastolic CHF  - BNP mildly elevated 227.9 and chest x-ray did not show vascular congestion, however she admits to dependent fluid and significant weight gain - Followed by Dr. Verlin outpatient - Torsemide  and Zaroxolyn  currently on hold due to worsening renal function - Cardiology consulted, signed off 7/12 - Echocardiogram EF 60-65%  - 7/13: After discussion with patient regarding benefits and risk of IV diuretic use and monitoring kidney function, we decided to trial 1 dose of IV Lasix  to see if she would have any improvement in her respiratory status. Her dry weight in the office was 195 pounds.  This morning her weight was 198 pounds here - 7/14: Breathing and swelling improved. Decided to trial another 1 dose of IV lasix  today  - 7/15: Symptoms improving but not back to baseline. Another dose IV lasix  today. Potentially change to PO lasix  tomorrow for discharge. She feels she has built up tolerance to torsemide    AKI on CKD stage IIIb - Baseline  creatinine 1.4.   - Admitted with creatinine 1.9 --> 2.76 --> 2.38 --> 2.43 --> 2.46 --> 2.0 --> 1.85  - Monitor closely on Lasix    Right hip pain - Xray without fracture   Extensive cutaneous candidiasis with developing  cellulitis under breast - Nystatin  powder - Cefadroxil    Demand ischemia - In setting of above.  Troponin 28, 27  Paroxysmal A-fib - Eliquis   Hyperlipidemia - Crestor    Diabetes mellitus - A1c 6.6 - Refusing CBG checks   DVT prophylaxis:  apixaban  (ELIQUIS ) tablet 2.5 mg Start: 06/16/24 2200 apixaban  (ELIQUIS ) tablet 2.5 mg  Code Status: DNR Family Communication: None at bedside; updated son over the phone 7/14 Disposition Plan: Home Status is: Inpatient Remains inpatient appropriate because: IV lasix      Antimicrobials:  Anti-infectives (From admission, onward)    Start     Dose/Rate Route Frequency Ordered Stop   06/21/24 1430  cefadroxil  (DURICEF) capsule 500 mg        500 mg Oral Daily 06/21/24 1333 06/26/24 0959   06/21/24 1400  cephALEXin  (KEFLEX ) capsule 500 mg  Status:  Discontinued        500 mg Oral Every 6 hours 06/21/24 1309 06/21/24 1333        Objective: Vitals:   06/21/24 2319 06/22/24 0500 06/22/24 0650 06/22/24 0823  BP:  (!) 181/95 (!) 140/69 (!) 167/70  Pulse: (!) 51 (!) 48  (!) 48  Resp: 19 17 18 19   Temp: 98.1 F (36.7 C) 97.7 F (36.5 C) 97.7 F (36.5 C) 97.7 F (36.5 C)  TempSrc: Oral Oral Oral Oral  SpO2: 93% 94%  97%  Weight:      Height:        Intake/Output Summary (Last 24 hours) at 06/22/2024 1156 Last data filed at 06/22/2024 0600 Gross per 24 hour  Intake 716 ml  Output 2550 ml  Net -1834 ml   Filed Weights   06/16/24 1411 06/16/24 1835 06/17/24 0555  Weight: 88.9 kg 88.6 kg 90 kg    Examination:  General exam: Appears calm and comfortable  Respiratory system: Diminished breath sounds, no crackles or wheezing, no conversational dyspnea today, on room air  Cardiovascular system: S1 & S2 heard, RRR. No murmurs.  Nonpitting pedal edema. Gastrointestinal system: Abdomen is nondistended, soft  Central nervous system: Alert and oriented. No focal neurological deficits. Speech clear.  Extremities: Symmetric in  appearance  Skin: Dry skin of her legs, +erythema under her breast and groin, under breast is worse compared to under groin but overall with improvement in appearance from yesterday's exam.  She does have nystatin  powder in place. Psychiatry: Judgement and insight appear normal. Mood & affect appropriate.    Data Reviewed: I have personally reviewed following labs and imaging studies  CBC: Recent Labs  Lab 06/16/24 1441 06/17/24 0410  WBC 8.7 8.2  NEUTROABS 7.1  --   HGB 11.6* 11.4*  HCT 35.3* 35.5*  MCV 92.2 92.0  PLT 300 317   Basic Metabolic Panel: Recent Labs  Lab 06/16/24 1958 06/17/24 0410 06/17/24 1440 06/18/24 0434 06/19/24 0430 06/20/24 0456 06/21/24 0520 06/22/24 0431  NA  --    < > 134* 136 134* 135 137 137  K  --    < > 4.0 3.5 5.0 4.7 4.3 4.9  CL  --    < > 97* 98 97* 98 100 96*  CO2  --    < > 23 24 25 25 27 28   GLUCOSE  --    < >  226* 188* 268* 196* 138* 150*  BUN  --    < > 61* 61* 61* 65* 62* 61*  CREATININE  --    < > 2.38* 2.38* 2.43* 2.46* 2.00* 1.85*  CALCIUM   --    < > 8.0* 8.2* 8.7* 8.6* 8.2* 8.4*  MG 1.7  --   --   --   --   --   --   --   PHOS 4.0  --  3.8  --   --   --   --   --    < > = values in this interval not displayed.   GFR: Estimated Creatinine Clearance: 21.9 mL/min (A) (by C-G formula based on SCr of 1.85 mg/dL (H)). Liver Function Tests: Recent Labs  Lab 06/16/24 1539 06/17/24 1440  AST 17  --   ALT 10  --   ALKPHOS 71  --   BILITOT 0.7  --   PROT 6.2*  --   ALBUMIN 2.8* 3.0*   No results for input(s): LIPASE, AMYLASE in the last 168 hours. No results for input(s): AMMONIA in the last 168 hours. Coagulation Profile: No results for input(s): INR, PROTIME in the last 168 hours. Cardiac Enzymes: No results for input(s): CKTOTAL, CKMB, CKMBINDEX, TROPONINI in the last 168 hours. BNP (last 3 results) No results for input(s): PROBNP in the last 8760 hours. HbA1C: No results for input(s): HGBA1C in  the last 72 hours.  CBG: No results for input(s): GLUCAP in the last 168 hours. Lipid Profile: No results for input(s): CHOL, HDL, LDLCALC, TRIG, CHOLHDL, LDLDIRECT in the last 72 hours. Thyroid  Function Tests: No results for input(s): TSH, T4TOTAL, FREET4, T3FREE, THYROIDAB in the last 72 hours. Anemia Panel: No results for input(s): VITAMINB12, FOLATE, FERRITIN, TIBC, IRON , RETICCTPCT in the last 72 hours. Sepsis Labs: No results for input(s): PROCALCITON, LATICACIDVEN in the last 168 hours.  Recent Results (from the past 240 hours)  Resp panel by RT-PCR (RSV, Flu A&B, Covid) Anterior Nasal Swab     Status: None   Collection Time: 06/16/24  4:35 PM   Specimen: Anterior Nasal Swab  Result Value Ref Range Status   SARS Coronavirus 2 by RT PCR NEGATIVE NEGATIVE Final   Influenza A by PCR NEGATIVE NEGATIVE Final   Influenza B by PCR NEGATIVE NEGATIVE Final    Comment: (NOTE) The Xpert Xpress SARS-CoV-2/FLU/RSV plus assay is intended as an aid in the diagnosis of influenza from Nasopharyngeal swab specimens and should not be used as a sole basis for treatment. Nasal washings and aspirates are unacceptable for Xpert Xpress SARS-CoV-2/FLU/RSV testing.  Fact Sheet for Patients: BloggerCourse.com  Fact Sheet for Healthcare Providers: SeriousBroker.it  This test is not yet approved or cleared by the [place]  FDA and has been authorized for detection and/or diagnosis of SARS-CoV-2 by FDA under an Emergency Use Authorization (EUA). This EUA will remain in effect (meaning this test can be used) for the duration of the COVID-19 declaration under Section 564(b)(1) of the Act, 21 U.S.C. section 360bbb-3(b)(1), unless the authorization is terminated or revoked.     Resp Syncytial Virus by PCR NEGATIVE NEGATIVE Final    Comment: (NOTE) Fact Sheet for  Patients: BloggerCourse.com  Fact Sheet for Healthcare Providers: SeriousBroker.it  This test is not yet approved or cleared by the [place]  FDA and has been authorized for detection and/or diagnosis of SARS-CoV-2 by FDA under an Emergency Use Authorization (EUA). This EUA will remain in effect (meaning this test can  be used) for the duration of the COVID-19 declaration under Section 564(b)(1) of the Act, 21 U.S.C. section 360bbb-3(b)(1), unless the authorization is terminated or revoked.  Performed at [facility], [address], [place], [place] [address]       Radiology Studies: DG HIP UNILAT WITH PELVIS 2-3 VIEWS RIGHT Result Date: 06/21/2024 CLINICAL DATA:  Right hip pain.  No trauma history is submitted. EXAM: DG HIP (WITH OR WITHOUT PELVIS) 2-3V RIGHT COMPARISON:  None Available. FINDINGS: Images are degraded by patient body habitus and overlying pannus. No fracture or dislocation. Moderate bilateral hip joint space narrowing, slightly greater left than right. Vascular calcifications. IMPRESSION: Degenerative change, without acute osseous finding. Limitations secondary to patient body habitus. Electronically Signed   By: Rockey Kilts M.D.   On: 06/21/2024 14:10       Scheduled Meds:  apixaban   2.5 mg Oral BID   arformoterol   15 mcg Nebulization BID   cefadroxil   500 mg Oral Daily   dorzolamide   1 drop Both Eyes BID   gabapentin   100 mg Oral q morning   gabapentin   200 mg Oral QHS   ipratropium-albuterol   3 mL Nebulization BID   latanoprost   1 drop Both Eyes q AM   nystatin    Topical TID   pantoprazole   40 mg Oral Daily   predniSONE   50 mg Oral Q breakfast   revefenacin   175 mcg Nebulization Daily   rosuvastatin   20 mg Oral Daily   Continuous Infusions:   LOS: 5 days   Time spent: 25 minutes   Delon Hoe, DO [facility] 06/22/2024, 11:56 AM   Available via Epic secure chat 7am-7pm After  these hours, please refer to coverage provider listed on [email or web address]

## 2024-06-23 ENCOUNTER — Encounter: Admitting: Family

## 2024-06-23 DIAGNOSIS — J441 Chronic obstructive pulmonary disease with (acute) exacerbation: Secondary | ICD-10-CM | POA: Diagnosis not present

## 2024-06-23 LAB — BASIC METABOLIC PANEL WITH GFR
Anion gap: 12 (ref 5–15)
BUN: 56 mg/dL — ABNORMAL HIGH (ref 8–23)
CO2: 28 mmol/L (ref 22–32)
Calcium: 8.1 mg/dL — ABNORMAL LOW (ref 8.9–10.3)
Chloride: 94 mmol/L — ABNORMAL LOW (ref 98–111)
Creatinine, Ser: 1.63 mg/dL — ABNORMAL HIGH (ref 0.44–1.00)
GFR, Estimated: 30 mL/min — ABNORMAL LOW (ref >60)
Glucose, Bld: 202 mg/dL — ABNORMAL HIGH (ref 70–99)
Potassium: 4.4 mmol/L (ref 3.5–5.1)
Sodium: 134 mmol/L — ABNORMAL LOW (ref 135–145)

## 2024-06-23 MED ORDER — FUROSEMIDE 10 MG/ML IJ SOLN
40.0000 mg | Freq: Once | INTRAMUSCULAR | Status: AC
  Administered 2024-06-23: 40 mg via INTRAVENOUS
  Filled 2024-06-23: qty 4

## 2024-06-23 NOTE — TOC Initial Note (Signed)
 Transition of Care [facility]) - Initial/Assessment Note    Patient Details  Name: Jenny [name] MRN: [id number] Date of Birth: September 26, 1933  Transition of Care [facility] CM/SW Contact:    Sudie Erminio Deems, RN Phone Number: 06/23/2024, 2:00 PM  Clinical Narrative:  Late Entry: Patient presented for shortness of breath-COPD exacerbation. PTA patient was from home and has support of son. Patient reports that she has DME RW and WC in the home. Patient is currently active with [facility] RN/PT/OT. Patient will need orders as she progresses. Case Manager will continue to follow for additional needs.                  Expected Discharge Plan: Home w Home Health Services Barriers to Discharge: No Barriers Identified   Patient Goals and CMS Choice Patient states their goals for this hospitalization and ongoing recovery are:: Plan to return home once stable.   Choice offered to / list presented to : NA      Expected Discharge Plan and Services In-house Referral: NA Discharge Planning Services: CM Consult Post Acute Care Choice: Home Health Living arrangements for the past 2 months: Single Family Home                   DME Agency: NA       HH Arranged: RN, Disease Management, PT, OT HH Agency: [facility] Date HH Agency Contacted: 06/18/24 Time HH Agency Contacted: 1145 Representative spoke with at [facility] Agency: Donette  Prior Living Arrangements/Services Living arrangements for the past 2 months: Single Family Home Lives with:: Self Patient language and need for interpreter reviewed:: Yes Do you feel safe going back to the place where you live?: Yes      Need for Family Participation in Patient Care: No (Comment) Care giver support system in place?: No (comment) Current home services: DME (rolling walker and wheelchair) Criminal Activity/Legal Involvement Pertinent to Current Situation/Hospitalization: No - Comment as needed  Activities of Daily Living    ADL Screening (condition at time of admission) Independently performs ADLs?: Yes (appropriate for developmental age) Is the patient deaf or have difficulty hearing?: No Does the patient have difficulty seeing, even when wearing glasses/contacts?: No Does the patient have difficulty concentrating, remembering, or making decisions?: No  Permission Sought/Granted Permission sought to share information with : Family Supports, Oceanographer granted to share information with : Yes, Verbal Permission Granted              Emotional Assessment Appearance:: Appears stated age Attitude/Demeanor/Rapport: Engaged Affect (typically observed): Appropriate Orientation: : Oriented to Self, Oriented to Place, Oriented to  Time, Oriented to Situation Alcohol / Substance Use: Not Applicable Psych Involvement: No (comment)  Admission diagnosis:  Hypokalemia [E87.6] SOB (shortness of breath) [R06.02] COPD exacerbation (HCC) [J44.1] AKI (acute kidney injury) (HCC) [N17.9] Patient Active Problem List   Diagnosis Date Noted   Right hip pain 06/21/2024   SOB (shortness of breath) 06/17/2024   COPD exacerbation (HCC) 06/16/2024   Other secondary pulmonary hypertension (HCC) 10/14/2023   Junctional rhythm 09/27/2023   Chronic heart failure with preserved ejection fraction (HCC) 09/26/2023   Symptomatic bradycardia 09/26/2023   Pain due to onychomycosis of toenails of both feet 05/27/2023   Type II diabetes mellitus with peripheral circulatory disorder (HCC) 05/27/2023   Tachycardia-bradycardia syndrome (HCC) 03/28/2022   Stage 3b chronic kidney disease (HCC) 02/21/2022   Type 2 diabetes mellitus with diabetic chronic kidney disease (HCC) 02/21/2022  Normocytic anemia 02/21/2022   Chronic obstructive pulmonary disease, unspecified (HCC) 02/21/2022   Edema, unspecified 02/21/2022   Hypertension 02/21/2022   Congestive heart failure (HCC) 02/21/2022   Hyperlipidemia,  unspecified 02/21/2022   Old myocardial infarction 02/21/2022   PAF (paroxysmal atrial fibrillation) (HCC) 02/21/2022   Heart failure (HCC) 11/04/2021   Acute on chronic diastolic CHF (congestive heart failure) (HCC) 11/03/2021   Secondary hypercoagulable state (HCC) 10/23/2021   Persistent atrial fibrillation (HCC) 10/11/2021   Cellulitis 08/22/2021   Obese    Dyspepsia 04/05/2011   Allergic rhinitis 10/16/2010   COPD (chronic obstructive pulmonary disease) (HCC) 05/21/2010   Essential hypertension 04/06/2010   EDEMA 03/09/2010   ANEMIA-NOS 10/11/2009   Unspecified glaucoma 10/11/2009   Coronary artery disease involving native coronary artery of native heart without angina pectoris 10/03/2009   HLD (hyperlipidemia) 07/12/2009   TOBACCO ABUSE 07/12/2009   Acute myocardial infarction (HCC) 07/12/2009   Type 2 diabetes, diet controlled (HCC) 07/12/2009   PCP:  Ileen Rosaline NOVAK, NP Pharmacy:   [facility] - [place], [place] - [address] [place] [address] Phone: [phone] Fax: [phone]  [facility] [address] [place] [address] Phone: [phone] Fax: [phone]     Social Drivers of Health (SDOH) Social History: SDOH Screenings   Food Insecurity: No Food Insecurity (06/16/2024)  Housing: Low Risk  (06/16/2024)  Transportation Needs: No Transportation Needs (06/16/2024)  Utilities: Not At Risk (06/16/2024)  Depression (PHQ2-9): Low Risk  (07/22/2022)  Social Connections: Moderately Isolated (06/16/2024)  Tobacco Use: Medium Risk (06/17/2024)   SDOH Interventions:     Readmission Risk Interventions     No data to display

## 2024-06-23 NOTE — Progress Notes (Signed)
 PROGRESS NOTE    Jenny [name]  FMW:[id number] DOB: July 22, 1933 DOA: 06/16/2024 PCP: Ileen Rosaline NOVAK, NP     Brief Narrative:  Jenny [name]  is a 89 y.o. female, with medical history significant of HFpEF, atrial fibrillation on Eliquis , COPD, hypertension, tachybradycardia syndrome, hyperlipidemia, CAD, presents to the emergency department with worsening shortness of breath.  Patient reports ongoing dyspnea, progressive over the last few months to few weeks, but much worsened recently, she was seen by nephrology recently, where she was instructed to increase her oral diuretic, reports not much improvement of her symptoms. In ED, had no hypoxia, her BNP was elevated at 227, but this is less than her baseline most recently at 441, high-sensitivity troponins elevated at 28, labs were significant for hypokalemia 2.8, and creatinine elevated at 1.9 from baseline of 1.5, patient received IV steroids, nebulizer treatment with improvement of her symptoms, [facility] consulted to admit.  Patient was treated for COPD exacerbation with steroids and neb treatments.  Cardiology consulted for CHF, did not feel she was volume overloaded.  They signed off 7/12.  Due to continued patient's symptoms of dyspnea with exertion, swelling, IV Lasix  was started 7/13 with clinical improvement.  New events last 24 hours / Subjective: Continues to report improvement in her respiratory status.  Denies any chest pains or any new complaints.  Creatinine improving on IV Lasix   Assessment & Plan:   Principal Problem:   COPD exacerbation (HCC) Active Problems:   Tachycardia-bradycardia syndrome (HCC)   Coronary artery disease involving native coronary artery of native heart without angina pectoris   Normocytic anemia   Essential hypertension   Stage 3b chronic kidney disease (HCC)   Type 2 diabetes, diet controlled (HCC)   COPD (chronic obstructive pulmonary disease) (HCC)   HLD (hyperlipidemia)   PAF  (paroxysmal atrial fibrillation) (HCC)   Obese   Persistent atrial fibrillation (HCC)   Heart failure (HCC)   Type II diabetes mellitus with peripheral circulatory disorder (HCC)   Other secondary pulmonary hypertension (HCC)   SOB (shortness of breath)   Right hip pain   Dyspnea on exertion, multifactorial with COPD exacerbation and diastolic CHF COVID/influenza/RSV negative Neb tx, solumedrol --> prednisone    Acute on chronic diastolic CHF  BNP mildly elevated 227.9 and chest x-ray did not show vascular congestion, however she admits to dependent fluid and significant weight gain Followed by Dr. Verlin outpatient Cardiology consulted, signed off 7/12 Echocardiogram EF 60-65%  IV Lasix  daily was started on 7/13, with significant symptomatic improvement, creatinine also improving Potentially change to PO diuretic, pending discussion with cardiology for a quicker follow-up appointment. She feels she has built up tolerance to torsemide    AKI on CKD stage IIIb Baseline creatinine 1.4.   Admitted with creatinine 1.9 --> 2.76 --> 2.38 --> 2.43 --> 2.46 --> 2.0 --> 1.85-->1.6 Monitor closely on Lasix    Right hip pain Xray without fracture   Extensive cutaneous candidiasis with developing cellulitis under breast Nystatin  powder Cefadroxil    Demand ischemia In setting of above.  Troponin 28, 27  Paroxysmal A-fib Eliquis   Hyperlipidemia Crestor    Diabetes mellitus A1c 6.6 Refusing CBG checks   DVT prophylaxis:  apixaban  (ELIQUIS ) tablet 2.5 mg Start: 06/16/24 2200 apixaban  (ELIQUIS ) tablet 2.5 mg  Code Status: DNR Family Communication: None at bedside Disposition Plan: Home Status is: Inpatient Remains inpatient appropriate because: IV lasix      Antimicrobials:  Anti-infectives (From admission, onward)    Start     Dose/Rate Route Frequency  Ordered Stop   06/21/24 1430  cefadroxil  (DURICEF) capsule 500 mg        500 mg Oral Daily 06/21/24 1333 06/26/24 0959    06/21/24 1400  cephALEXin  (KEFLEX ) capsule 500 mg  Status:  Discontinued        500 mg Oral Every 6 hours 06/21/24 1309 06/21/24 1333        Objective: Vitals:   06/23/24 0759 06/23/24 0833 06/23/24 1117 06/23/24 1642  BP:  (!) 158/82  (!) 173/60  Pulse:  (!) 49  (!) 50  Resp:  18 18 18   Temp:  97.6 F (36.4 C) 97.6 F (36.4 C) 97.8 F (36.6 C)  TempSrc:  Axillary Axillary Oral  SpO2: 98% 94%  95%  Weight:      Height:        Intake/Output Summary (Last 24 hours) at 06/23/2024 1900 Last data filed at 06/23/2024 1643 Gross per 24 hour  Intake 1080 ml  Output 1200 ml  Net -120 ml   Filed Weights   06/16/24 1411 06/16/24 1835 06/17/24 0555  Weight: 88.9 kg 88.6 kg 90 kg    Examination:  General: NAD  Cardiovascular: S1, S2 present Respiratory: Diminished breath sounds bilaterally Abdomen: Soft, nontender, nondistended, bowel sounds present Musculoskeletal: No bilateral pedal edema noted Skin: Dry skin of her legs, +erythema under her breast and groin Psychiatry: Normal mood     Data Reviewed: I have personally reviewed following labs and imaging studies  CBC: Recent Labs  Lab 06/17/24 0410  WBC 8.2  HGB 11.4*  HCT 35.5*  MCV 92.0  PLT 317   Basic Metabolic Panel: Recent Labs  Lab 06/16/24 1958 06/17/24 0410 06/17/24 1440 06/18/24 0434 06/19/24 0430 06/20/24 0456 06/21/24 0520 06/22/24 0431 06/23/24 0357  NA  --    < > 134*   < > 134* 135 137 137 134*  K  --    < > 4.0   < > 5.0 4.7 4.3 4.9 4.4  CL  --    < > 97*   < > 97* 98 100 96* 94*  CO2  --    < > 23   < > 25 25 27 28 28   GLUCOSE  --    < > 226*   < > 268* 196* 138* 150* 202*  BUN  --    < > 61*   < > 61* 65* 62* 61* 56*  CREATININE  --    < > 2.38*   < > 2.43* 2.46* 2.00* 1.85* 1.63*  CALCIUM   --    < > 8.0*   < > 8.7* 8.6* 8.2* 8.4* 8.1*  MG 1.7  --   --   --   --   --   --   --   --   PHOS 4.0  --  3.8  --   --   --   --   --   --    < > = values in this interval not displayed.    GFR: Estimated Creatinine Clearance: 24.9 mL/min (A) (by C-G formula based on SCr of 1.63 mg/dL (H)). Liver Function Tests: Recent Labs  Lab 06/17/24 1440  ALBUMIN 3.0*   No results for input(s): LIPASE, AMYLASE in the last 168 hours. No results for input(s): AMMONIA in the last 168 hours. Coagulation Profile: No results for input(s): INR, PROTIME in the last 168 hours. Cardiac Enzymes: No results for input(s): CKTOTAL, CKMB, CKMBINDEX, TROPONINI in the last 168 hours.  BNP (last 3 results) No results for input(s): PROBNP in the last 8760 hours. HbA1C: No results for input(s): HGBA1C in the last 72 hours.  CBG: No results for input(s): GLUCAP in the last 168 hours. Lipid Profile: No results for input(s): CHOL, HDL, LDLCALC, TRIG, CHOLHDL, LDLDIRECT in the last 72 hours. Thyroid  Function Tests: No results for input(s): TSH, T4TOTAL, FREET4, T3FREE, THYROIDAB in the last 72 hours. Anemia Panel: No results for input(s): VITAMINB12, FOLATE, FERRITIN, TIBC, IRON , RETICCTPCT in the last 72 hours. Sepsis Labs: No results for input(s): PROCALCITON, LATICACIDVEN in the last 168 hours.  Recent Results (from the past 240 hours)  Resp panel by RT-PCR (RSV, Flu A&B, Covid) Anterior Nasal Swab     Status: None   Collection Time: 06/16/24  4:35 PM   Specimen: Anterior Nasal Swab  Result Value Ref Range Status   SARS Coronavirus 2 by RT PCR NEGATIVE NEGATIVE Final   Influenza A by PCR NEGATIVE NEGATIVE Final   Influenza B by PCR NEGATIVE NEGATIVE Final    Comment: (NOTE) The Xpert Xpress SARS-CoV-2/FLU/RSV plus assay is intended as an aid in the diagnosis of influenza from Nasopharyngeal swab specimens and should not be used as a sole basis for treatment. Nasal washings and aspirates are unacceptable for Xpert Xpress SARS-CoV-2/FLU/RSV testing.  Fact Sheet for Patients: BloggerCourse.com  Fact  Sheet for Healthcare Providers: SeriousBroker.it  This test is not yet approved or cleared by the [place]  FDA and has been authorized for detection and/or diagnosis of SARS-CoV-2 by FDA under an Emergency Use Authorization (EUA). This EUA will remain in effect (meaning this test can be used) for the duration of the COVID-19 declaration under Section 564(b)(1) of the Act, 21 U.S.C. section 360bbb-3(b)(1), unless the authorization is terminated or revoked.     Resp Syncytial Virus by PCR NEGATIVE NEGATIVE Final    Comment: (NOTE) Fact Sheet for Patients: BloggerCourse.com  Fact Sheet for Healthcare Providers: SeriousBroker.it  This test is not yet approved or cleared by the [place]  FDA and has been authorized for detection and/or diagnosis of SARS-CoV-2 by FDA under an Emergency Use Authorization (EUA). This EUA will remain in effect (meaning this test can be used) for the duration of the COVID-19 declaration under Section 564(b)(1) of the Act, 21 U.S.C. section 360bbb-3(b)(1), unless the authorization is terminated or revoked.  Performed at [facility], [address], [place], [place] [address]       Radiology Studies: No results found.      Scheduled Meds:  apixaban   2.5 mg Oral BID   arformoterol   15 mcg Nebulization BID   cefadroxil   500 mg Oral Daily   dorzolamide   1 drop Both Eyes BID   gabapentin   100 mg Oral q morning   gabapentin   200 mg Oral QHS   ipratropium-albuterol   3 mL Nebulization BID   latanoprost   1 drop Both Eyes q AM   nystatin    Topical TID   pantoprazole   40 mg Oral Daily   predniSONE   40 mg Oral Q breakfast   revefenacin   175 mcg Nebulization Daily   rosuvastatin   20 mg Oral Daily   Continuous Infusions:   LOS: 6 days     Lebron [name] Cage, MD [facility] 06/23/2024, 7:00 PM   Available via Epic secure chat 7am-7pm After these  hours, please refer to coverage provider listed on [email or web address]

## 2024-06-23 NOTE — Progress Notes (Addendum)
 OT Cancellation Note  Patient Details Name: Jenny [name] MRN: [id number] DOB: 11-16-1933   Cancelled Treatment:    Reason Eval/Treat Not Completed: Patient at procedure or test/ unavailable pt eating lunch and requested to wait until after lunch for therapy, will f/u as time allows for OT session.  Returned at 1208 with pt initially agreeable to session however pts son arrived and asked for this OTA to return. Will continue to check back as time allows.    Ronal Mallie POUR., COTA/L Acute Rehabilitation Services [phone]   Ronal Mallie Needy 06/23/2024, 12:13 PM

## 2024-06-23 NOTE — Progress Notes (Signed)
 Mobility Specialist Progress Note;   06/23/24 1407  Mobility  Activity Ambulated with assistance in hallway;Transferred from bed to chair  Level of Assistance Standby assist, set-up cues, supervision of patient - no hands on  Assistive Device Four wheel walker  Distance Ambulated (ft) 200 ft  Activity Response Tolerated well  Mobility Referral Yes  Mobility visit 1 Mobility  Mobility Specialist Start Time (ACUTE ONLY) 1407  Mobility Specialist Stop Time (ACUTE ONLY) 1435  Mobility Specialist Time Calculation (min) (ACUTE ONLY) 28 min   Pt agreeable to mobility on second attempt. Required no physical assistance during ambulation, SV. Took 1x seated rest break d/t fatigue, VSS on RA when checked. Transferred pt to chair at [facility]. Pt left in chair with all needs met, call bell in reach.   Lauraine Erm Mobility Specialist Please contact via SecureChat or [employer] [phone]

## 2024-06-23 NOTE — Plan of Care (Signed)
  Problem: Education: Goal: Knowledge of General Education information will improve Description: Including pain rating scale, medication(s)/side effects and non-pharmacologic comfort measures Outcome: Progressing   Problem: Health Behavior/Discharge Planning: Goal: Ability to manage health-related needs will improve Outcome: Progressing   Problem: Clinical Measurements: Goal: Ability to maintain clinical measurements within normal limits will improve Outcome: Progressing Goal: Will remain free from infection Outcome: Progressing Goal: Diagnostic test results will improve Outcome: Progressing Goal: Respiratory complications will improve Outcome: Progressing Goal: Cardiovascular complication will be avoided Outcome: Progressing   Problem: Activity: Goal: Risk for activity intolerance will decrease Outcome: Progressing   Problem: Nutrition: Goal: Adequate nutrition will be maintained Outcome: Progressing   Problem: Coping: Goal: Level of anxiety will decrease Outcome: Progressing   Problem: Elimination: Goal: Will not experience complications related to bowel motility Outcome: Progressing Goal: Will not experience complications related to urinary retention Outcome: Progressing   Problem: Pain Managment: Goal: General experience of comfort will improve and/or be controlled Outcome: Progressing   Problem: Safety: Goal: Ability to remain free from injury will improve Outcome: Progressing   Problem: Skin Integrity: Goal: Risk for impaired skin integrity will decrease Outcome: Progressing   Problem: Education: Goal: Knowledge of disease or condition will improve Outcome: Progressing Goal: Knowledge of the prescribed therapeutic regimen will improve Outcome: Progressing Goal: Individualized Educational Video(s) Outcome: Progressing   Problem: Activity: Goal: Ability to tolerate increased activity will improve Outcome: Progressing Goal: Will verbalize the  importance of balancing activity with adequate rest periods Outcome: Progressing   Problem: Respiratory: Goal: Ability to maintain a clear airway will improve Outcome: Progressing Goal: Levels of oxygenation will improve Outcome: Progressing Goal: Ability to maintain adequate ventilation will improve Outcome: Progressing   Problem: Clinical Measurements: Goal: Ability to avoid or minimize complications of infection will improve Outcome: Progressing   Problem: Skin Integrity: Goal: Skin integrity will improve Outcome: Progressing   Problem: Clinical Measurements: Goal: Ability to avoid or minimize complications of infection will improve Outcome: Progressing   Problem: Skin Integrity: Goal: Skin integrity will improve Outcome: Progressing

## 2024-06-24 ENCOUNTER — Other Ambulatory Visit (HOSPITAL_COMMUNITY): Payer: Self-pay

## 2024-06-24 DIAGNOSIS — J441 Chronic obstructive pulmonary disease with (acute) exacerbation: Secondary | ICD-10-CM | POA: Diagnosis not present

## 2024-06-24 LAB — BASIC METABOLIC PANEL WITH GFR
Anion gap: 12 (ref 5–15)
BUN: 63 mg/dL — ABNORMAL HIGH (ref 8–23)
CO2: 28 mmol/L (ref 22–32)
Calcium: 8.1 mg/dL — ABNORMAL LOW (ref 8.9–10.3)
Chloride: 95 mmol/L — ABNORMAL LOW (ref 98–111)
Creatinine, Ser: 1.74 mg/dL — ABNORMAL HIGH (ref 0.44–1.00)
GFR, Estimated: 28 mL/min — ABNORMAL LOW (ref >60)
Glucose, Bld: 134 mg/dL — ABNORMAL HIGH (ref 70–99)
Potassium: 4.6 mmol/L (ref 3.5–5.1)
Sodium: 135 mmol/L (ref 135–145)

## 2024-06-24 MED ORDER — NYSTATIN 100000 UNIT/GM EX POWD
Freq: Three times a day (TID) | CUTANEOUS | 0 refills | Status: DC
  Filled 2024-06-24: qty 15, 5d supply, fill #0

## 2024-06-24 MED ORDER — CEFADROXIL 500 MG PO CAPS
500.0000 mg | ORAL_CAPSULE | Freq: Every day | ORAL | 0 refills | Status: AC
  Filled 2024-06-24: qty 1, 1d supply, fill #0

## 2024-06-24 MED ORDER — TORSEMIDE 20 MG PO TABS: 20.0000 mg | ORAL_TABLET | Freq: Two times a day (BID) | ORAL | Status: DC

## 2024-06-24 MED ORDER — PREDNISONE 20 MG PO TABS
40.0000 mg | ORAL_TABLET | Freq: Every day | ORAL | 0 refills | Status: AC
  Filled 2024-06-24: qty 6, 3d supply, fill #0

## 2024-06-24 NOTE — Plan of Care (Signed)
  Problem: Education: Goal: Knowledge of General Education information will improve Description: Including pain rating scale, medication(s)/side effects and non-pharmacologic comfort measures Outcome: Adequate for Discharge   Problem: Health Behavior/Discharge Planning: Goal: Ability to manage health-related needs will improve Outcome: Adequate for Discharge   Problem: Clinical Measurements: Goal: Ability to maintain clinical measurements within normal limits will improve Outcome: Adequate for Discharge Goal: Will remain free from infection Outcome: Adequate for Discharge Goal: Diagnostic test results will improve Outcome: Adequate for Discharge Goal: Respiratory complications will improve Outcome: Adequate for Discharge Goal: Cardiovascular complication will be avoided Outcome: Adequate for Discharge   Problem: Activity: Goal: Risk for activity intolerance will decrease Outcome: Adequate for Discharge   Problem: Nutrition: Goal: Adequate nutrition will be maintained Outcome: Adequate for Discharge   Problem: Coping: Goal: Level of anxiety will decrease Outcome: Adequate for Discharge   Problem: Elimination: Goal: Will not experience complications related to bowel motility Outcome: Adequate for Discharge Goal: Will not experience complications related to urinary retention Outcome: Adequate for Discharge   Problem: Pain Managment: Goal: General experience of comfort will improve and/or be controlled Outcome: Adequate for Discharge   Problem: Safety: Goal: Ability to remain free from injury will improve Outcome: Adequate for Discharge   Problem: Skin Integrity: Goal: Risk for impaired skin integrity will decrease Outcome: Adequate for Discharge   Problem: Education: Goal: Knowledge of disease or condition will improve Outcome: Adequate for Discharge Goal: Knowledge of the prescribed therapeutic regimen will improve Outcome: Adequate for Discharge Goal:  Individualized Educational Video(s) Outcome: Adequate for Discharge   Problem: Activity: Goal: Ability to tolerate increased activity will improve Outcome: Adequate for Discharge Goal: Will verbalize the importance of balancing activity with adequate rest periods Outcome: Adequate for Discharge   Problem: Respiratory: Goal: Ability to maintain a clear airway will improve Outcome: Adequate for Discharge Goal: Levels of oxygenation will improve Outcome: Adequate for Discharge Goal: Ability to maintain adequate ventilation will improve Outcome: Adequate for Discharge   Problem: Acute Rehab PT Goals(only PT should resolve) Goal: Patient Will Transfer Sit To/From Stand Outcome: Adequate for Discharge Goal: Pt Will Ambulate Outcome: Adequate for Discharge   Problem: Acute Rehab OT Goals (only OT should resolve) Goal: Pt. Will Perform Grooming Outcome: Adequate for Discharge Goal: Pt. Will Perform Upper Body Bathing Outcome: Adequate for Discharge Goal: Pt. Will Perform Lower Body Bathing Outcome: Adequate for Discharge Goal: Pt. Will Transfer To Toilet Outcome: Adequate for Discharge   Problem: Clinical Measurements: Goal: Ability to avoid or minimize complications of infection will improve Outcome: Adequate for Discharge   Problem: Skin Integrity: Goal: Skin integrity will improve Outcome: Adequate for Discharge   Problem: Clinical Measurements: Goal: Ability to avoid or minimize complications of infection will improve Outcome: Adequate for Discharge   Problem: Skin Integrity: Goal: Skin integrity will improve Outcome: Adequate for Discharge

## 2024-06-24 NOTE — Discharge Summary (Addendum)
 Physician Discharge Summary   Patient: Jenny [name] MRN: [id number] DOB: 05-04-1933  Admit date:     06/16/2024  Discharge date: 06/24/24  Discharge Physician: Lebron [name] Cage   PCP: Ileen Rosaline NOVAK, NP   Recommendations at discharge:   Follow-up with PCP in 1 week with repeat labs Follow-up with cardiology as scheduled, next week Thursday.  Discharge Diagnoses: Principal Problem:   COPD exacerbation (HCC) Active Problems:   Tachycardia-bradycardia syndrome (HCC)   Coronary artery disease involving native coronary artery of native heart without angina pectoris   Normocytic anemia   Essential hypertension   Stage 3b chronic kidney disease (HCC)   Type 2 diabetes, diet controlled (HCC)   COPD (chronic obstructive pulmonary disease) (HCC)   HLD (hyperlipidemia)   PAF (paroxysmal atrial fibrillation) (HCC)   Obese   Persistent atrial fibrillation (HCC)   Heart failure (HCC)   Type II diabetes mellitus with peripheral circulatory disorder (HCC)   Other secondary pulmonary hypertension (HCC)   SOB (shortness of breath)   Right hip pain    Hospital Course: Jenny [name]  is a 89 y.o. female, with medical history significant of HFpEF, atrial fibrillation on Eliquis , COPD, hypertension, tachybradycardia syndrome, hyperlipidemia, CAD, presents to the ED with worsening SOB.  Patient reports ongoing dyspnea, progressive over the last few months to few weeks, but much worsened recently, she was seen by nephrology recently, where she was instructed to increase her oral diuretic, reports not much improvement of her symptoms. In ED, had no hypoxia, her BNP was elevated at 227, but this is less than her baseline most recently at 441, high-sensitivity troponins elevated at 28, labs were significant for hypokalemia 2.8, and creatinine elevated at 1.9 from baseline of 1.5, patient received IV steroids, nebulizer treatment with improvement of her symptoms, [facility] consulted to admit.  Patient was treated for COPD exacerbation with steroids and neb treatments.  Cardiology consulted for CHF, did not feel she was volume overloaded.  They signed off 7/12.  Due to continued patient's symptoms of dyspnea with exertion, swelling, IV Lasix  was started 7/13 with clinical improvement, 3.4 L output.     Today, patient denies any new complaints, breathing is improved.  Wants to be on Lasix  orally instead of torsemide .  Advised patient to discuss with the cardiology team as well as nephrology concerning past oral diuretic for her.  Patient verbalized understanding.  Patient has an appointment with the cardiology team next week Thursday.   Assessment and Plan: Dyspnea on exertion, multifactorial with COPD exacerbation and diastolic CHF COVID/influenza/RSV negative Neb tx, solumedrol --> prednisone  x 5 days   Acute on chronic diastolic CHF, likely present on admission BNP mildly elevated 227.9 and chest x-ray did not show vascular congestion, however she admits to dependent fluid and significant weight gain Followed by Dr. Verlin outpatient Cardiology consulted, signed off 7/12 Echocardiogram EF 60-65%  IV Lasix  daily was started on 7/13, with significant symptomatic improvement, creatinine also improving Continue PTA torsemide  20 twice daily, outpatient follow-up with cardiology scheduled for next week for further management.  Reports that she feels she has built up tolerance to torsemide   Cardiology outpatient follow-up   AKI on CKD stage IIIb Baseline creatinine 1.4.   Outpatient follow-up   Right hip pain Xray without fracture    Extensive cutaneous candidiasis with developing cellulitis under breast Nystatin  powder Cefadroxil  x 5 days total   Demand ischemia In setting of above.  Troponin 28, 27   Paroxysmal A-fib Eliquis   Hyperlipidemia Crestor     Diabetes mellitus A1c 6.6 Refusing CBG checks       Consultants: Cardiology Procedures performed:  None Disposition: Home health Diet recommendation:  Cardiac and Carb modified diet    DISCHARGE MEDICATION: Allergies as of 06/24/2024       Reactions   Aspirin  Other (See Comments)   Can take 81 mg not 325 mg -bleeding   Entresto  [sacubitril -valsartan ] Shortness Of Breath   COPD and reports increased SOB with Entresto    Atorvastatin  Itching   Itching and myaliga Other Reaction(s): itching, rash, Other (See Comments)   Cyclobenzaprine    Other reaction(s): muscle relaxers-ulcer hemorrhage (hospitalized) Other Reaction(s): Other (See Comments), ulcer hemorrhage   Lactose Intolerance (gi) Other (See Comments)   GI upset   Meperidine Hcl Other (See Comments)   Not known   Pravastatin  Rash   rash Other Reaction(s): Other (See Comments)   Propoxyphene    Other reaction(s): pain meds, hallucination, vomiting Other Reaction(s): Hallucination, hallucinations, vomiting, Other (See Comments), Unknown   Wound Dressing Adhesive    Other reaction(s): red, stings        Medication List     STOP taking these medications    Stiolto Respimat 2.5-2.5 MCG/ACT Aers Generic drug: Tiotropium Bromide-Olodaterol       TAKE these medications    acetaminophen  500 MG tablet Commonly known as: TYLENOL  Take 500 mg by mouth every 6 (six) hours as needed. Takes two tablets in the morning and two tablets at night   albuterol  108 (90 Base) MCG/ACT inhaler Commonly known as: Ventolin  HFA INHALE ONE PUFF EVERY 6 HOURS AS NEEDED FOR SHORTNESS OF BREATH   apixaban  2.5 MG Tabs tablet Commonly known as: ELIQUIS  Take 1 tablet (2.5 mg total) by mouth 2 (two) times daily.   Bevespi  Aerosphere 9-4.8 MCG/ACT Aero Generic drug: Glycopyrrolate -Formoterol  Inhale 2 puffs into the lungs 2 (two) times daily.   cefadroxil  500 MG capsule Commonly known as: DURICEF Take 1 capsule (500 mg total) by mouth daily for 1 day. Start taking on: June 25, 2024   dorzolamide  2 % ophthalmic solution Commonly  known as: TRUSOPT  Place 1 drop into both eyes 2 (two) times daily.   gabapentin  100 MG capsule Commonly known as: NEURONTIN  Take 100-200 mg by mouth See admin instructions. Take 1 capsule (100mg ) by mouth every morning and take 2 capsules (200mg ) by mouth at bedtime   hydrOXYzine 25 MG tablet Commonly known as: ATARAX Take 25 mg by mouth every 8 (eight) hours as needed for itching.   ipratropium-albuterol  0.5-2.5 (3) MG/3ML Soln Commonly known as: DUONEB Take 3 mLs by nebulization every 6 (six) hours as needed.   latanoprost  0.005 % ophthalmic solution Commonly known as: XALATAN  Place 1 drop into both eyes in the morning.   metolazone  2.5 MG tablet Commonly known as: ZAROXOLYN  Take 1 tablet (2.5 mg total) by mouth as needed. What changed: reasons to take this   Nitrostat  0.4 MG SL tablet Generic drug: nitroGLYCERIN  DISSOLVE ONE TABLET UNDER TONGUE AS NEEDED FOR CHEST PAIN - MAY REPEAT TWICE-IF NO RELIEF GO TO NEAREST HOSPITAL ER   nystatin  powder Commonly known as: MYCOSTATIN /NYSTOP  Apply topically 3 (three) times daily.   pantoprazole  40 MG tablet Commonly known as: Protonix  Take 1 tablet (40 mg total) by mouth daily.   predniSONE  20 MG tablet Commonly known as: DELTASONE  Take 2 tablets (40 mg total) by mouth daily with breakfast for 3 days. Start taking on: June 25, 2024   rosuvastatin  20 MG  tablet Commonly known as: CRESTOR  Take 20 mg by mouth daily.   torsemide  20 MG tablet Commonly known as: DEMADEX  Take 1 tablet (20 mg total) by mouth 2 (two) times daily.        Follow-up Information     Schmerge, Rosaline NOVAK, NP. Schedule an appointment as soon as possible for a visit in 1 week(s).   Specialty: Nurse Practitioner Contact information: [address] [place] [address] [phone]                Discharge Exam: Jenny [name]   06/16/24 1411 06/16/24 1835 06/17/24 0555  Weight: 88.9 kg 88.6 kg 90 kg   General: NAD  Cardiovascular:  S1, S2 present Respiratory: Diminished breath sounds bilaterally Abdomen: Soft, nontender, nondistended, bowel sounds present Musculoskeletal: bilateral pedal edema noted Skin: Normal Psychiatry: Normal mood   Condition at discharge: stable  The results of significant diagnostics from this hospitalization (including imaging, microbiology, ancillary and laboratory) are listed below for reference.   Imaging Studies: DG HIP UNILAT WITH PELVIS 2-3 VIEWS RIGHT Result Date: 06/21/2024 CLINICAL DATA:  Right hip pain.  No trauma history is submitted. EXAM: DG HIP (WITH OR WITHOUT PELVIS) 2-3V RIGHT COMPARISON:  None Available. FINDINGS: Images are degraded by patient body habitus and overlying pannus. No fracture or dislocation. Moderate bilateral hip joint space narrowing, slightly greater left than right. Vascular calcifications. IMPRESSION: Degenerative change, without acute osseous finding. Limitations secondary to patient body habitus. Electronically Signed   By: Rockey Kilts M.D.   On: 06/21/2024 14:10   ECHOCARDIOGRAM COMPLETE Result Date: 06/18/2024    ECHOCARDIOGRAM REPORT   Patient Name:   Jenny [name] Date of Exam: 06/18/2024 Medical Rec #:  [id number]     Height:       62.0 in Accession #:    [id number]    Weight:       198.4 lb Date of Birth:  21-Jan-1933     BSA:          1.905 m Patient Age:    89 years      BP:           123/86 mmHg Patient Gender: F             HR:           54 bpm. Exam Location:  Inpatient Procedure: 2D Echo, Cardiac Doppler and Color Doppler (Both Spectral and Color            Flow Doppler were utilized during procedure). Indications:    Dyspnea  History:        Patient has prior history of Echocardiogram examinations. CHF,                 Arrythmias:Atrial Fibrillation, Signs/Symptoms:Shortness of                 Breath; Risk Factors:Hypertension.  Sonographer:    CM Referring Phys: [id number] TAYLOR A PARCELLS IMPRESSIONS  1. Left ventricular ejection fraction, by  estimation, is 60 to 65%. The left ventricle has normal function. The left ventricle has no regional wall motion abnormalities. There is moderate asymmetric left ventricular hypertrophy of the basal-septal segment. Left ventricular diastolic parameters are indeterminate.  2. Right ventricular systolic function is normal. The right ventricular size is mildly enlarged. There is mildly elevated pulmonary artery systolic pressure. The estimated right ventricular systolic pressure is 43.7 mmHg.  3. Left atrial size was moderately dilated.  4. Right atrial size was moderately  dilated.  5. The mitral valve is degenerative. Trivial mitral valve regurgitation. Moderate mitral stenosis. Severe mitral annular calcification. MG , MVA 1.4cm^2 by continuity equation.  6. The aortic valve is tricuspid. Aortic valve regurgitation is not visualized. Aortic valve sclerosis/calcification is present, without any evidence of aortic stenosis. FINDINGS  Left Ventricle: Left ventricular ejection fraction, by estimation, is 60 to 65%. The left ventricle has normal function. The left ventricle has no regional wall motion abnormalities. The left ventricular internal cavity size was normal in size. There is  moderate asymmetric left ventricular hypertrophy of the basal-septal segment. Left ventricular diastolic parameters are indeterminate. Right Ventricle: The right ventricular size is mildly enlarged. Right vetricular wall thickness was not well visualized. Right ventricular systolic function is normal. There is mildly elevated pulmonary artery systolic pressure. The tricuspid regurgitant  velocity is 3.19 m/s, and with an assumed right atrial pressure of 3 mmHg, the estimated right ventricular systolic pressure is 43.7 mmHg. Left Atrium: Left atrial size was moderately dilated. Right Atrium: Right atrial size was moderately dilated. Pericardium: There is no evidence of pericardial effusion. Mitral Valve: The mitral valve is  degenerative in appearance. Severe mitral annular calcification. Trivial mitral valve regurgitation. Moderate mitral valve stenosis. Tricuspid Valve: The tricuspid valve is normal in structure. Tricuspid valve regurgitation is trivial. Aortic Valve: The aortic valve is tricuspid. Aortic valve regurgitation is not visualized. Aortic valve sclerosis/calcification is present, without any evidence of aortic stenosis. Aortic valve mean gradient measures 5.0 mmHg. Aortic valve peak gradient measures 10.6 mmHg. Aortic valve area, by VTI measures 2.56 cm. Pulmonic Valve: The pulmonic valve was not well visualized. Pulmonic valve regurgitation is not visualized. Aorta: The aortic root and ascending aorta are structurally normal, with no evidence of dilitation. IAS/Shunts: The interatrial septum was not well visualized.  LEFT VENTRICLE PLAX 2D LVIDd:         4.40 cm   Diastology LVIDs:         2.80 cm   LV e' lateral:   6.96 cm/s LV PW:         1.20 cm   LV E/e' lateral: 18.5 LV IVS:        1.70 cm LVOT diam:     2.10 cm LV SV:         89 LV SV Index:   47 LVOT Area:     3.46 cm  RIGHT VENTRICLE TAPSE (M-mode): 1.9 cm LEFT ATRIUM              Index        RIGHT ATRIUM           Index LA Vol (A2C):   100.0 ml 52.49 ml/m  RA Area:     27.30 cm LA Vol (A4C):   59.0 ml  30.97 ml/m  RA Volume:   88.30 ml  46.35 ml/m LA Biplane Vol: 82.3 ml  43.20 ml/m  AORTIC VALVE AV Area (Vmax):    2.83 cm AV Area (Vmean):   2.72 cm AV Area (VTI):     2.56 cm AV Vmax:           163.00 cm/s AV Vmean:          111.000 cm/s AV VTI:            0.346 m AV Peak Grad:      10.6 mmHg AV Mean Grad:      5.0 mmHg LVOT Vmax:         133.00 cm/s  LVOT Vmean:        87.200 cm/s LVOT VTI:          0.256 m LVOT/AV VTI ratio: 0.74  AORTA Ao Root diam: 2.70 cm Ao Asc diam:  3.20 cm MITRAL VALVE                TRICUSPID VALVE MV Area (PHT): 2.41 cm     TR Peak grad:   40.7 mmHg MV Decel Time: 315 msec     TR Vmax:        319.00 cm/s MV E velocity:  129.00 cm/s MV A velocity: 62.40 cm/s   SHUNTS MV E/A ratio:  2.07         Systemic VTI:  0.26 m                             Systemic Diam: 2.10 cm Lonni Nanas MD Electronically signed by Lonni Nanas MD Signature Date/Time: 06/18/2024/2:18:17 PM    Final    DG Finger Index Left Result Date: 06/16/2024 CLINICAL DATA:  Left finger swelling. EXAM: LEFT INDEX FINGER 2+V COMPARISON:  None Available. FINDINGS: There is no evidence of fracture or dislocation. There is no evidence of arthropathy or other focal bone abnormality. Mild diffuse soft tissue swelling is noted. IMPRESSION: Mild diffuse soft tissue swelling.  No fracture or dislocation. Electronically Signed   By: Lynwood Landy Raddle M.D.   On: 06/16/2024 15:43   DG Hand Complete Right Result Date: 06/16/2024 CLINICAL DATA:  Right hand pain. EXAM: RIGHT HAND - COMPLETE 3+ VIEW COMPARISON:  None Available. FINDINGS: There is no evidence of fracture or dislocation. Severe degenerative changes seen involving first carpometacarpal joint. Soft tissues are unremarkable. IMPRESSION: Severe osteoarthritis of first carpometacarpal joint. No acute abnormality seen. Electronically Signed   By: Lynwood Landy Raddle M.D.   On: 06/16/2024 15:41   DG Chest 1 View Result Date: 06/16/2024 CLINICAL DATA:  Shortness of breath. EXAM: CHEST  1 VIEW COMPARISON:  September 26, 2023. FINDINGS: Mild cardiomegaly is noted. No acute pulmonary disease is noted. Bony thorax is unremarkable. IMPRESSION: No active disease. Aortic Atherosclerosis (ICD10-I70.0). Electronically Signed   By: Lynwood Landy Raddle M.D.   On: 06/16/2024 15:40    Microbiology: Results for orders placed or performed during the hospital encounter of 06/16/24  Resp panel by RT-PCR (RSV, Flu A&B, Covid) Anterior Nasal Swab     Status: None   Collection Time: 06/16/24  4:35 PM   Specimen: Anterior Nasal Swab  Result Value Ref Range Status   SARS Coronavirus 2 by RT PCR NEGATIVE NEGATIVE Final   Influenza A  by PCR NEGATIVE NEGATIVE Final   Influenza B by PCR NEGATIVE NEGATIVE Final    Comment: (NOTE) The Xpert Xpress SARS-CoV-2/FLU/RSV plus assay is intended as an aid in the diagnosis of influenza from Nasopharyngeal swab specimens and should not be used as a sole basis for treatment. Nasal washings and aspirates are unacceptable for Xpert Xpress SARS-CoV-2/FLU/RSV testing.  Fact Sheet for Patients: BloggerCourse.com  Fact Sheet for Healthcare Providers: SeriousBroker.it  This test is not yet approved or cleared by the [place]  FDA and has been authorized for detection and/or diagnosis of SARS-CoV-2 by FDA under an Emergency Use Authorization (EUA). This EUA will remain in effect (meaning this test can be used) for the duration of the COVID-19 declaration under Section 564(b)(1) of the Act, 21 U.S.C. section 360bbb-3(b)(1), unless the authorization is terminated  or revoked.     Resp Syncytial Virus by PCR NEGATIVE NEGATIVE Final    Comment: (NOTE) Fact Sheet for Patients: BloggerCourse.com  Fact Sheet for Healthcare Providers: SeriousBroker.it  This test is not yet approved or cleared by the [place]  FDA and has been authorized for detection and/or diagnosis of SARS-CoV-2 by FDA under an Emergency Use Authorization (EUA). This EUA will remain in effect (meaning this test can be used) for the duration of the COVID-19 declaration under Section 564(b)(1) of the Act, 21 U.S.C. section 360bbb-3(b)(1), unless the authorization is terminated or revoked.  Performed at [facility], [address] [place], [place] [address]     Labs: CBC: No results for input(s): WBC, NEUTROABS, HGB, HCT, MCV, PLT in the last 168 hours. Basic Metabolic Panel: Recent Labs  Lab 06/17/24 1440 06/18/24 0434 06/20/24 0456 06/21/24 0520 06/22/24 0431 06/23/24 0357  06/24/24 0437  NA 134*   < > 135 137 137 134* 135  K 4.0   < > 4.7 4.3 4.9 4.4 4.6  CL 97*   < > 98 100 96* 94* 95*  CO2 23   < > 25 27 28 28 28   GLUCOSE 226*   < > 196* 138* 150* 202* 134*  BUN 61*   < > 65* 62* 61* 56* 63*  CREATININE 2.38*   < > 2.46* 2.00* 1.85* 1.63* 1.74*  CALCIUM  8.0*   < > 8.6* 8.2* 8.4* 8.1* 8.1*  PHOS 3.8  --   --   --   --   --   --    < > = values in this interval not displayed.   Liver Function Tests: Recent Labs  Lab 06/17/24 1440  ALBUMIN 3.0*   CBG: No results for input(s): GLUCAP in the last 168 hours.  Discharge time spent: greater than 30 minutes.  Signed: Lebron [name] Cage, MD [facility] 06/24/2024

## 2024-06-24 NOTE — Progress Notes (Signed)
 Explained discharge instructions to patient. Reviewed follow up appointment and next medication administration times. Also reviewed education. Patient verbalized having an understanding for instructions given. All belongings are in the patient's possession. Volunteer service will take the patient to [facility] pharmacy to pick up meds. IV and telemetry were removed. CCMD was notified. No other needs verbalized. Transported downstairs for discharge.

## 2024-06-24 NOTE — Progress Notes (Signed)
 PT Cancellation Note  Patient Details Name: Jenny [name] MRN: [id number] DOB: [birth year]   Cancelled Treatment:    Reason Eval/Treat Not Completed: Patient declined, no reason specified.  I've got the sponge (purewick) on and don't want to go with it. 06/24/2024  Jenny HERO., PT Acute Rehabilitation Services [phone]  (office)   Jenny [name] 06/24/2024, 11:02 AM

## 2024-06-24 NOTE — Progress Notes (Signed)
 Occupational Therapy Treatment Patient Details Name: Jenny [name] MRN: [id number] DOB: 1933/01/02 Today's Date: 06/24/2024   History of present illness 89 yo female admitted 06/16/24 from home with SOB, COPD exacerbation and fall causing edema in L index finger and Rt hand. PMH: HFpEF, CKD, HTN, T2DM, COPD, Afib on Eliquis , CAD, HLD   OT comments  Pt progressing well towards goals. Pt agreeable for ADL session in preparation for d/c. Acute R hip pain limitng pt's performance with LB ADLs. Noticed pt completing pursed lip breathing during dressing, pt reporting use for controlled breathing during pain management. Reinforced education regarding energy conservation strategies for ADLs. Continue to recommend HHOT to optimize independence levels. Will continue to follow acutely.       If plan is discharge home, recommend the following:  A little help with walking and/or transfers;A little help with bathing/dressing/bathroom;Assistance with cooking/housework;Help with stairs or ramp for entrance   Equipment Recommendations  None recommended by OT    Recommendations for Other Services      Precautions / Restrictions Precautions Precautions: Fall Recall of Precautions/Restrictions: Intact Restrictions Weight Bearing Restrictions Per Provider Order: No       Mobility Bed Mobility   General bed mobility comments: Received in recliner    Transfers Overall transfer level: Needs assistance Equipment used: None Transfers: Sit to/from Stand Sit to Stand: Supervision     General transfer comment: S for safety, pt declining further mobility     Balance Overall balance assessment: Needs assistance Sitting-balance support: No upper extremity supported, Feet supported Sitting balance-Leahy Scale: Good Sitting balance - Comments: sat EOB to complete bathing with supervision   Standing balance support: No upper extremity supported Standing balance-Leahy Scale: Fair Standing balance  comment: Completing standing ADLs no LOB       ADL either performed or assessed with clinical judgement   ADL Overall ADL's : Needs assistance/impaired     Upper Body Dressing : Set up;Sitting   Lower Body Dressing: Minimal assistance;Sit to/from stand Lower Body Dressing Details (indicate cue type and reason): Assist to thread legs     General ADL Comments: pt performance limited by pain    Extremity/Trunk Assessment Upper Extremity Assessment Upper Extremity Assessment: Overall WFL for tasks assessed   Lower Extremity Assessment Lower Extremity Assessment: Defer to PT evaluation        Vision   Vision Assessment?: No apparent visual deficits         Communication Communication Communication: No apparent difficulties   Cognition Arousal: Alert Behavior During Therapy: WFL for tasks assessed/performed Cognition: No apparent impairments     Following commands: Intact        Cueing   Cueing Techniques: Verbal cues        General Comments Pt sounding SOB and using pursed lip breathing, O2 levels at 96, pt reporting no SOB just using for pain management    Pertinent Vitals/ Pain       Pain Assessment Pain Assessment: Faces Faces Pain Scale: Hurts even more Pain Location: R hip Pain Descriptors / Indicators: Discomfort, Guarding, Grimacing Pain Intervention(s): Monitored during session   Frequency  Min 2X/week        Progress Toward Goals  OT Goals(current goals can now be found in the care plan section)  Progress towards OT goals: Progressing toward goals  Acute Rehab OT Goals Patient Stated Goal: To go home OT Goal Formulation: With patient Time For Goal Achievement: 07/01/24 Potential to Achieve Goals: Good ADL Goals Pt Will Perform  Grooming: with modified independence;sitting;standing Pt Will Perform Upper Body Bathing: Independently;sitting Pt Will Perform Lower Body Bathing: with modified independence;sit to/from stand;sitting/lateral  leans Pt Will Transfer to Toilet: with modified independence;ambulating  Plan         AM-PAC OT 6 Clicks Daily Activity     Outcome Measure   Help from another person eating meals?: None Help from another person taking care of personal grooming?: A Little Help from another person toileting, which includes using toliet, bedpan, or urinal?: A Little Help from another person bathing (including washing, rinsing, drying)?: A Lot Help from another person to put on and taking off regular upper body clothing?: A Little Help from another person to put on and taking off regular lower body clothing?: A Little 6 Click Score: 18    End of Session    OT Visit Diagnosis: Unsteadiness on feet (R26.81);Other abnormalities of gait and mobility (R26.89);Muscle weakness (generalized) (M62.81);History of falling (Z91.81)   Activity Tolerance Patient tolerated treatment well   Patient Left in chair;with call bell/phone within reach;with family/visitor present   Nurse Communication Mobility status        Time: 1350-1406 OT Time Calculation (min): 16 min  Charges: OT General Charges $OT Visit: 1 Visit OT Treatments $Self Care/Home Management : 8-22 mins  Adrianne BROCKS, OT  Acute Rehabilitation Services Office [phone] Secure chat preferred   Adrianne GORMAN Savers 06/24/2024, 2:18 PM

## 2024-06-25 ENCOUNTER — Telehealth: Payer: Self-pay | Admitting: Family

## 2024-06-25 MED ORDER — FUROSEMIDE 40 MG PO TABS: 40.0000 mg | ORAL_TABLET | Freq: Every day | ORAL | 3 refills | Status: DC

## 2024-06-25 NOTE — Telephone Encounter (Signed)
 Received a text from Aimee (home health nurse) that patient would like to stop torsemide  and go back to taking furosemide  as she feels like that works better for her.   Advised Aimee to have patient stop torsemide  and begin furosemide  40mg  daily. She has upcoming appt at the [facility] 07/01/24 and will get BMET at that time. Aimee will let patient know.

## 2024-06-30 ENCOUNTER — Telehealth: Payer: Self-pay | Admitting: Family

## 2024-06-30 NOTE — Telephone Encounter (Signed)
 Called to confirm/remind patient of their appointment at the [facility] on 07/01/24.   Appointment:   [x] Confirmed  [] Left mess   [] No answer/No voice mail  [] VM Full/unable to leave message  [] Phone not in service  Patient reminded to bring all medications and/or complete list.  Confirmed patient has transportation. Gave directions, instructed to utilize valet parking.

## 2024-06-30 NOTE — Progress Notes (Deleted)
 [facility] Note    PCP: seeing [facility] every month Primary Cardiologist: Barbette Bruckner, MD (last seen 02/25)  Chief Complaint:    HPI:  Ms Contee is a 89 y/o female with a history of CAD, hyperlipidemia, HTN, pulmonary HTN, anemia, COPD, glaucoma, CKD, atrial fibrillation, tachy-brady syndrome, tobacco use and chronic heart failure.   Admitted 09/26/23 with worsening shortness of breath. Has also noticed black colored stool. Hemoglobin of 9.3 with baseline above 13 but it was more than a year ago, renal function stable and at baseline. BNP elevated at 441. Chest x-ray with concern of pulmonary vascular congestion. IV lasix  given, cardiology consulted. Started on iron  supplement. EP consulted for possible pacemaker. IV iron  given.   Echo 08/22/21: EF of 60-65% along with mild LVH/ LAE, mild MR and severely elevated PA pressure of 65.1 mmHg.  Echo 04/09/23: EF 65-70% along with Grade II DD, severely elevated PA pressure, mild LAE, moderate RAE and mild/ moderate TR.    Stress test 12/2013  Admitted 06/16/24 with worsening SOB even after increasing oral diuretic. In ED, had no hypoxia, her BNP was elevated at 227, but this is less than her baseline most recently at 441, high-sensitivity troponins elevated at 28, labs were significant for hypokalemia 2.8, and creatinine elevated at 1.9 from baseline of 1.5, patient received IV steroids, nebulizer treatment with improvement of her symptoms. Treated for COPD exacerbation with steroids and neb treatments. Cardiology consulted for CHF, did not feel she was volume overloaded. Due to continued patient's symptoms of dyspnea with exertion, swelling, IV Lasix  was started 7/13 with clinical improvement, 3.4 L output. Echo 06/18/24: EF 60-65%, moderate LVH, normal RV, mildly elevated PA pressure of 43.7 mmHg, moderate biatrial enlargement, moderate MS.   She presents today, with her son, for post hospital visit with a chief  complaint of   ROS: All systems negative except as listed in HPI, PMH and Problem List.  SH:  Social History   Socioeconomic History   Marital status: Widowed    Spouse name: Not on file   Number of children: Not on file   Years of education: Not on file   Highest education level: Not on file  Occupational History   Not on file  Tobacco Use   Smoking status: Former    Current packs/day: 0.00    Average packs/day: 0.5 packs/day for 60.0 years (30.0 ttl pk-yrs)    Types: Cigarettes    Start date: 11/12/1961    Quit date: 11/12/2021    Years since quitting: 2.6   Smokeless tobacco: Never   Tobacco comments:    She lives in [place] with her significant other (Charles Hook)0  Vaping Use   Vaping status: Never Used  Substance and Sexual Activity   Alcohol use: Yes    Alcohol/week: 1.0 - 2.0 standard drink of alcohol    Types: 1 - 2 Glasses of wine per week    Comment: once or twice a year glass of wine 12/25/21   Drug use: No   Sexual activity: Not on file  Other Topics Concern   Not on file  Social History Narrative   Lives in [place] with her SO - charles hook   Retired -Acupuncturist   Enjoys travel - live Engineer, maintenance (IT)   Social Drivers of Corporate investment banker Strain: Not on file  Food Insecurity: No Food Insecurity (06/16/2024)   Hunger Vital Sign    Worried About Programme researcher, broadcasting/film/video in  the Last Year: Never true    Ran Out of Food in the Last Year: Never true  Transportation Needs: No Transportation Needs (06/16/2024)   PRAPARE - Administrator, Civil Service (Medical): No    Lack of Transportation (Non-Medical): No  Physical Activity: Not on file  Stress: Not on file  Social Connections: Moderately Isolated (06/16/2024)   Social Connection and Isolation Panel    Frequency of Communication with Friends and Family: More than three times a week    Frequency of Social Gatherings with Friends and Family: Three times a week    Attends Religious  Services: 1 to 4 times per year    Active Member of Clubs or Organizations: No    Attends Banker Meetings: Never    Marital Status: Widowed  Intimate Partner Violence: Not At Risk (06/16/2024)   Humiliation, Afraid, Rape, and Kick questionnaire    Fear of Current or Ex-Partner: No    Emotionally Abused: No    Physically Abused: No    Sexually Abused: No    FH:  Family History  Problem Relation Age of Onset   Hypertension Mother    Ulcers Mother    Stomach cancer Father    Stroke Maternal Grandmother    Heart attack Neg Hx     Past Medical History:  Diagnosis Date   ACUT DUOD ULCER W/HEMORR&PERF W/O MENTION OBST 10/05/2009   NSAID induced   ALLERGIC RHINITIS CAUSE UNSPECIFIED    ANEMIA-NOS    CAD (coronary artery disease) 06/08/2009   DEs RCA with 70% LAD and EF 60%   CHF (congestive heart failure) (HCC)    COPD    mild obst on PFTs 03/2010   Diabetes mellitus 06/2010 dx   Mild, diet controlled   GLAUCOMA    HYPERLIPIDEMIA    HYPERTENSION, BENIGN    MYOCARDIAL INFARCTION 06/08/2009   des to rca   Persistent atrial fibrillation (HCC)    Dx 08/2021   TOBACCO ABUSE     Current Outpatient Medications  Medication Sig Dispense Refill   acetaminophen  (TYLENOL ) 500 MG tablet Take 500 mg by mouth every 6 (six) hours as needed. Takes two tablets in the morning and two tablets at night     albuterol  (VENTOLIN  HFA) 108 (90 Base) MCG/ACT inhaler INHALE ONE PUFF EVERY 6 HOURS AS NEEDED FOR SHORTNESS OF BREATH 18 g 2   apixaban  (ELIQUIS ) 2.5 MG TABS tablet Take 1 tablet (2.5 mg total) by mouth 2 (two) times daily. 180 tablet 3   dorzolamide  (TRUSOPT ) 2 % ophthalmic solution Place 1 drop into both eyes 2 (two) times daily.     furosemide  (LASIX ) 40 MG tablet Take 1 tablet (40 mg total) by mouth daily. 90 tablet 3   gabapentin  (NEURONTIN ) 100 MG capsule Take 100-200 mg by mouth See admin instructions. Take 1 capsule (100mg ) by mouth every morning and take 2 capsules (200mg )  by mouth at bedtime     Glycopyrrolate -Formoterol  (BEVESPI  AEROSPHERE) 9-4.8 MCG/ACT AERO Inhale 2 puffs into the lungs 2 (two) times daily. 10.7 g 1   hydrOXYzine (ATARAX) 25 MG tablet Take 25 mg by mouth every 8 (eight) hours as needed for itching.     ipratropium-albuterol  (DUONEB) 0.5-2.5 (3) MG/3ML SOLN Take 3 mLs by nebulization every 6 (six) hours as needed. 360 mL 1   latanoprost  (XALATAN ) 0.005 % ophthalmic solution Place 1 drop into both eyes in the morning.     metolazone  (ZAROXOLYN ) 2.5 MG tablet Take  1 tablet (2.5 mg total) by mouth as needed. (Patient taking differently: Take 2.5 mg by mouth as needed (edema).) 20 tablet 0   NITROSTAT  0.4 MG SL tablet DISSOLVE ONE TABLET UNDER TONGUE AS NEEDED FOR CHEST PAIN - MAY REPEAT TWICE-IF NO RELIEF GO TO NEAREST HOSPITAL ER 25 tablet 0   nystatin  (MYCOSTATIN /NYSTOP ) powder Apply topically 3 (three) times daily. 15 g 0   pantoprazole  (PROTONIX ) 40 MG tablet Take 1 tablet (40 mg total) by mouth daily. 90 tablet 3   rosuvastatin  (CRESTOR ) 20 MG tablet Take 20 mg by mouth daily.     No current facility-administered medications for this visit.      PHYSICAL EXAM:  General: Well appearing. No resp difficulty HEENT: normal Neck: supple, no JVD Cor: Regular rhythm, rate. No rubs, gallops or murmurs Lungs: clear Abdomen: soft, nontender, nondistended. Extremities: no cyanosis, clubbing, rash, edema Neuro: alert & oriented X 3. Moves all 4 extremities w/o difficulty. Affect pleasant   ECG: not done   ASSESSMENT & PLAN:  1: NICM with preserved ejection fraction- - likely due to AF - NYHA class II - euvolemic today - weighing daily; reminded to call for an overnight weight gain of > 2 pounds or a weekly weight gain of > 5 pounds - weight 195 pounds from last visit here 4 months ago - Echo 08/22/21: EF of 60-65% along with mild LVH/ LAE, mild MR and severely elevated PA pressure of 65.1 mmHg. - Echo 04/09/23: EF 65-70% along with Grade  II DD, severely elevated PA pressure, mild LAE, moderate RAE and mild/ moderate TR.  - Echo 06/18/24: EF 60-65%, moderate LVH, normal RV, mildly elevated PA pressure of 43.7 mmHg, moderate biatrial enlargement, moderate MS.  - not adding salt to her food and tries to eat low sodium foods  - continue torsemide  20mg  BID with additional 20mg  PRN - continue metolazone  2.5mg  PRN when directed - SOB worsened when taking entresto  - not interested in spironolactone - continues to have issues with yeast in groin/ under breasts so not a good candidate for SGLT2 - saw cardiology Arliss) 02/25 - says that she generally cooks from scratch but does use salt; regular, kosher, sea salt, garlic salt etc; reviewed the importance of not using anything that has the word salt in it - BNP 06/16/24 reviewed and was 227.9  2: HTN- - BP  - seeing [facility] for primary care every month - home health nurse comes every few weeks  - BMP 06/24/24 reviewed: sodium 135, potassium 4.6, creatinine 1.74 and GFR 28 - saw nephrology Kolleen) 03/23  3: Persistent atrial fibrillation- - saw EP Robyne) 04/23 - continue apixaban  2.5mg  BID - had cardioversion 01/31/22 & post cardioversion, developed Mobitz 2nd degree AV block  4: COPD- - stopped smoking fall 2022 - using trelegy daily and nebulizer PRN - saw pulmonology Burnie) 03/24 - continue stiolto respimat daily; she says PCP is looking into any programs that may be able to assist with with paying for this medication - PFTs: 03/03/23 - FEV1 - 58%      Ellouise DELENA Class, FNP 06/30/24

## 2024-07-01 ENCOUNTER — Other Ambulatory Visit: Payer: Self-pay

## 2024-07-01 ENCOUNTER — Encounter (HOSPITAL_COMMUNITY): Payer: Self-pay

## 2024-07-01 ENCOUNTER — Telehealth: Payer: Self-pay | Admitting: Family

## 2024-07-01 ENCOUNTER — Ambulatory Visit: Admitting: Family

## 2024-07-01 ENCOUNTER — Inpatient Hospital Stay (HOSPITAL_COMMUNITY)
Admission: EM | Admit: 2024-07-01 | Discharge: 2024-07-05 | DRG: 291 | Disposition: A | Discharge status: Home Health | Attending: Internal Medicine | Admitting: Internal Medicine

## 2024-07-01 ENCOUNTER — Emergency Department (HOSPITAL_COMMUNITY)

## 2024-07-01 DIAGNOSIS — Z823 Family history of stroke: Secondary | ICD-10-CM

## 2024-07-01 DIAGNOSIS — Z7901 Long term (current) use of anticoagulants: Secondary | ICD-10-CM

## 2024-07-01 DIAGNOSIS — I272 Pulmonary hypertension, unspecified: Secondary | ICD-10-CM

## 2024-07-01 DIAGNOSIS — Z87891 Personal history of nicotine dependence: Secondary | ICD-10-CM

## 2024-07-01 DIAGNOSIS — J9621 Acute and chronic respiratory failure with hypoxia: Secondary | ICD-10-CM | POA: Diagnosis present

## 2024-07-01 DIAGNOSIS — E66812 Obesity, class 2: Secondary | ICD-10-CM | POA: Diagnosis present

## 2024-07-01 DIAGNOSIS — N179 Acute kidney failure, unspecified: Secondary | ICD-10-CM | POA: Diagnosis present

## 2024-07-01 DIAGNOSIS — I2489 Other forms of acute ischemic heart disease: Secondary | ICD-10-CM | POA: Diagnosis present

## 2024-07-01 DIAGNOSIS — I2510 Atherosclerotic heart disease of native coronary artery without angina pectoris: Secondary | ICD-10-CM | POA: Diagnosis present

## 2024-07-01 DIAGNOSIS — Z888 Allergy status to other drugs, medicaments and biological substances status: Secondary | ICD-10-CM

## 2024-07-01 DIAGNOSIS — Z1152 Encounter for screening for COVID-19: Secondary | ICD-10-CM

## 2024-07-01 DIAGNOSIS — N1832 Chronic kidney disease, stage 3b: Secondary | ICD-10-CM | POA: Diagnosis not present

## 2024-07-01 DIAGNOSIS — I5082 Biventricular heart failure: Secondary | ICD-10-CM | POA: Diagnosis present

## 2024-07-01 DIAGNOSIS — D72829 Elevated white blood cell count, unspecified: Secondary | ICD-10-CM | POA: Diagnosis present

## 2024-07-01 DIAGNOSIS — I130 Hypertensive heart and chronic kidney disease with heart failure and stage 1 through stage 4 chronic kidney disease, or unspecified chronic kidney disease: Principal | ICD-10-CM | POA: Diagnosis present

## 2024-07-01 DIAGNOSIS — J9601 Acute respiratory failure with hypoxia: Secondary | ICD-10-CM | POA: Diagnosis not present

## 2024-07-01 DIAGNOSIS — J441 Chronic obstructive pulmonary disease with (acute) exacerbation: Secondary | ICD-10-CM | POA: Diagnosis present

## 2024-07-01 DIAGNOSIS — I4821 Permanent atrial fibrillation: Secondary | ICD-10-CM | POA: Diagnosis present

## 2024-07-01 DIAGNOSIS — H409 Unspecified glaucoma: Secondary | ICD-10-CM | POA: Diagnosis present

## 2024-07-01 DIAGNOSIS — I5031 Acute diastolic (congestive) heart failure: Secondary | ICD-10-CM | POA: Diagnosis not present

## 2024-07-01 DIAGNOSIS — I700 Atherosclerosis of aorta: Secondary | ICD-10-CM | POA: Diagnosis present

## 2024-07-01 DIAGNOSIS — Z955 Presence of coronary angioplasty implant and graft: Secondary | ICD-10-CM

## 2024-07-01 DIAGNOSIS — I495 Sick sinus syndrome: Secondary | ICD-10-CM | POA: Diagnosis present

## 2024-07-01 DIAGNOSIS — Z79899 Other long term (current) drug therapy: Secondary | ICD-10-CM | POA: Diagnosis not present

## 2024-07-01 DIAGNOSIS — Z6836 Body mass index (BMI) 36.0-36.9, adult: Secondary | ICD-10-CM

## 2024-07-01 DIAGNOSIS — I5033 Acute on chronic diastolic (congestive) heart failure: Secondary | ICD-10-CM | POA: Diagnosis present

## 2024-07-01 DIAGNOSIS — Z886 Allergy status to analgesic agent status: Secondary | ICD-10-CM

## 2024-07-01 DIAGNOSIS — D631 Anemia in chronic kidney disease: Secondary | ICD-10-CM | POA: Diagnosis present

## 2024-07-01 DIAGNOSIS — I2729 Other secondary pulmonary hypertension: Secondary | ICD-10-CM | POA: Diagnosis present

## 2024-07-01 DIAGNOSIS — E785 Hyperlipidemia, unspecified: Secondary | ICD-10-CM | POA: Diagnosis present

## 2024-07-01 DIAGNOSIS — K219 Gastro-esophageal reflux disease without esophagitis: Secondary | ICD-10-CM | POA: Diagnosis present

## 2024-07-01 DIAGNOSIS — E1122 Type 2 diabetes mellitus with diabetic chronic kidney disease: Secondary | ICD-10-CM | POA: Diagnosis present

## 2024-07-01 DIAGNOSIS — E739 Lactose intolerance, unspecified: Secondary | ICD-10-CM | POA: Diagnosis present

## 2024-07-01 DIAGNOSIS — I252 Old myocardial infarction: Secondary | ICD-10-CM

## 2024-07-01 DIAGNOSIS — R0602 Shortness of breath: Principal | ICD-10-CM

## 2024-07-01 DIAGNOSIS — E8809 Other disorders of plasma-protein metabolism, not elsewhere classified: Secondary | ICD-10-CM | POA: Diagnosis present

## 2024-07-01 DIAGNOSIS — N184 Chronic kidney disease, stage 4 (severe): Secondary | ICD-10-CM | POA: Diagnosis present

## 2024-07-01 DIAGNOSIS — Z8249 Family history of ischemic heart disease and other diseases of the circulatory system: Secondary | ICD-10-CM

## 2024-07-01 DIAGNOSIS — I5032 Chronic diastolic (congestive) heart failure: Secondary | ICD-10-CM | POA: Diagnosis not present

## 2024-07-01 DIAGNOSIS — J449 Chronic obstructive pulmonary disease, unspecified: Secondary | ICD-10-CM | POA: Diagnosis not present

## 2024-07-01 DIAGNOSIS — R0989 Other specified symptoms and signs involving the circulatory and respiratory systems: Secondary | ICD-10-CM

## 2024-07-01 DIAGNOSIS — R0600 Dyspnea, unspecified: Secondary | ICD-10-CM | POA: Diagnosis present

## 2024-07-01 LAB — COMPREHENSIVE METABOLIC PANEL WITH GFR
ALT: 21 U/L (ref 0–44)
AST: 17 U/L (ref 15–41)
Albumin: 2.9 g/dL — ABNORMAL LOW (ref 3.5–5.0)
Alkaline Phosphatase: 86 U/L (ref 38–126)
Anion gap: 14 (ref 5–15)
BUN: 43 mg/dL — ABNORMAL HIGH (ref 8–23)
CO2: 27 mmol/L (ref 22–32)
Calcium: 6.6 mg/dL — ABNORMAL LOW (ref 8.9–10.3)
Chloride: 98 mmol/L (ref 98–111)
Creatinine, Ser: 1.81 mg/dL — ABNORMAL HIGH (ref 0.44–1.00)
GFR, Estimated: 27 mL/min — ABNORMAL LOW (ref >60)
Glucose, Bld: 159 mg/dL — ABNORMAL HIGH (ref 70–99)
Potassium: 3.3 mmol/L — ABNORMAL LOW (ref 3.5–5.1)
Sodium: 139 mmol/L (ref 135–145)
Total Bilirubin: 1.2 mg/dL (ref 0.0–1.2)
Total Protein: 6.2 g/dL — ABNORMAL LOW (ref 6.5–8.1)

## 2024-07-01 LAB — URINALYSIS, W/ REFLEX TO CULTURE (INFECTION SUSPECTED)
Bacteria, UA: NONE SEEN
Bilirubin Urine: NEGATIVE
Glucose, UA: 50 mg/dL — AB
Hgb urine dipstick: NEGATIVE
Ketones, ur: NEGATIVE mg/dL
Leukocytes,Ua: NEGATIVE
Nitrite: NEGATIVE
Protein, ur: 30 mg/dL — AB
Specific Gravity, Urine: 1.014 (ref 1.005–1.030)
pH: 6 (ref 5.0–8.0)

## 2024-07-01 LAB — CBC WITH DIFFERENTIAL/PLATELET
Abs Immature Granulocytes: 0.15 K/uL — ABNORMAL HIGH (ref 0.00–0.07)
Basophils Absolute: 0 K/uL (ref 0.0–0.1)
Basophils Relative: 0 %
Eosinophils Absolute: 0 K/uL (ref 0.0–0.5)
Eosinophils Relative: 0 %
HCT: 35.1 % — ABNORMAL LOW (ref 36.0–46.0)
Hemoglobin: 10.9 g/dL — ABNORMAL LOW (ref 12.0–15.0)
Immature Granulocytes: 1 %
Lymphocytes Relative: 5 %
Lymphs Abs: 0.6 K/uL — ABNORMAL LOW (ref 0.7–4.0)
MCH: 29.4 pg (ref 26.0–34.0)
MCHC: 31.1 g/dL (ref 30.0–36.0)
MCV: 94.6 fL (ref 80.0–100.0)
Monocytes Absolute: 0.8 K/uL (ref 0.1–1.0)
Monocytes Relative: 7 %
Neutro Abs: 9.5 K/uL — ABNORMAL HIGH (ref 1.7–7.7)
Neutrophils Relative %: 87 %
Platelets: 252 K/uL (ref 150–400)
RBC: 3.71 MIL/uL — ABNORMAL LOW (ref 3.87–5.11)
RDW: 15.9 % — ABNORMAL HIGH (ref 11.5–15.5)
WBC: 11.2 K/uL — ABNORMAL HIGH (ref 4.0–10.5)
nRBC: 0 % (ref 0.0–0.2)

## 2024-07-01 LAB — I-STAT CG4 LACTIC ACID, ED
Lactic Acid, Venous: 3.1 mmol/L (ref 0.5–1.9)
Lactic Acid, Venous: 1.7 mmol/L (ref 0.5–1.9)

## 2024-07-01 LAB — RESP PANEL BY RT-PCR (RSV, FLU A&B, COVID)  RVPGX2
Influenza A by PCR: NEGATIVE
Influenza B by PCR: NEGATIVE
Resp Syncytial Virus by PCR: NEGATIVE
SARS Coronavirus 2 by RT PCR: NEGATIVE

## 2024-07-01 LAB — TROPONIN I (HIGH SENSITIVITY)
Troponin I (High Sensitivity): 40 ng/L — ABNORMAL HIGH (ref <18)
Troponin I (High Sensitivity): 41 ng/L — ABNORMAL HIGH (ref <18)

## 2024-07-01 LAB — BRAIN NATRIURETIC PEPTIDE: B Natriuretic Peptide: 257.5 pg/mL — ABNORMAL HIGH (ref 0.0–100.0)

## 2024-07-01 MED ORDER — IPRATROPIUM-ALBUTEROL 0.5-2.5 (3) MG/3ML IN SOLN
3.0000 mL | Freq: Four times a day (QID) | RESPIRATORY_TRACT | Status: AC
  Administered 2024-07-01 (×2): 3 mL via RESPIRATORY_TRACT
  Filled 2024-07-01: qty 3

## 2024-07-01 MED ORDER — SODIUM CHLORIDE 0.9% FLUSH
3.0000 mL | Freq: Two times a day (BID) | INTRAVENOUS | Status: DC
  Administered 2024-07-01 – 2024-07-05 (×6): 3 mL via INTRAVENOUS

## 2024-07-01 MED ORDER — GABAPENTIN 100 MG PO CAPS
200.0000 mg | ORAL_CAPSULE | Freq: Every day | ORAL | Status: DC
  Administered 2024-07-01 – 2024-07-04 (×4): 200 mg via ORAL
  Filled 2024-07-01 (×4): qty 2

## 2024-07-01 MED ORDER — INSULIN ASPART 100 UNIT/ML IJ SOLN
0.0000 [IU] | Freq: Three times a day (TID) | INTRAMUSCULAR | Status: DC
  Administered 2024-07-03 – 2024-07-04 (×2): 7 [IU] via SUBCUTANEOUS
  Administered 2024-07-04 – 2024-07-05 (×3): 1 [IU] via SUBCUTANEOUS

## 2024-07-01 MED ORDER — GABAPENTIN 100 MG PO CAPS
100.0000 mg | ORAL_CAPSULE | Freq: Every morning | ORAL | Status: DC
  Administered 2024-07-02 – 2024-07-05 (×4): 100 mg via ORAL
  Filled 2024-07-01 (×4): qty 1

## 2024-07-01 MED ORDER — ROSUVASTATIN CALCIUM 20 MG PO TABS
20.0000 mg | ORAL_TABLET | Freq: Every day | ORAL | Status: DC
Start: 2024-07-01 — End: 2024-07-05
  Administered 2024-07-02 – 2024-07-05 (×4): 20 mg via ORAL
  Filled 2024-07-01 (×4): qty 1

## 2024-07-01 MED ORDER — METHYLPREDNISOLONE SODIUM SUCC 125 MG IJ SOLR
125.0000 mg | Freq: Once | INTRAMUSCULAR | Status: AC
  Administered 2024-07-01: 125 mg via INTRAVENOUS
  Filled 2024-07-01: qty 2

## 2024-07-01 MED ORDER — NITROGLYCERIN 0.4 MG SL SUBL: 0.4000 mg | SUBLINGUAL_TABLET | SUBLINGUAL | Status: DC | PRN

## 2024-07-01 MED ORDER — PREDNISONE 20 MG PO TABS
40.0000 mg | ORAL_TABLET | Freq: Every day | ORAL | Status: DC
  Administered 2024-07-02 – 2024-07-05 (×4): 40 mg via ORAL
  Filled 2024-07-01 (×4): qty 2

## 2024-07-01 MED ORDER — NICOTINE 14 MG/24HR TD PT24
14.0000 mg | MEDICATED_PATCH | Freq: Every day | TRANSDERMAL | Status: DC
  Filled 2024-07-01 (×2): qty 1

## 2024-07-01 MED ORDER — GABAPENTIN 100 MG PO CAPS: 100.0000 mg | ORAL_CAPSULE | ORAL | Status: DC

## 2024-07-01 MED ORDER — ACETAMINOPHEN 325 MG PO TABS: 650.0000 mg | ORAL_TABLET | Freq: Four times a day (QID) | ORAL | Status: DC | PRN

## 2024-07-01 MED ORDER — HYDRALAZINE HCL 20 MG/ML IJ SOLN
5.0000 mg | INTRAMUSCULAR | Status: DC | PRN
Start: 2024-07-01 — End: 2024-07-05

## 2024-07-01 MED ORDER — APIXABAN 2.5 MG PO TABS
2.5000 mg | ORAL_TABLET | Freq: Two times a day (BID) | ORAL | Status: DC
  Administered 2024-07-01 – 2024-07-02 (×3): 2.5 mg via ORAL
  Filled 2024-07-01 (×3): qty 1

## 2024-07-01 MED ORDER — ACETAMINOPHEN 650 MG RE SUPP: 650.0000 mg | Freq: Four times a day (QID) | RECTAL | Status: DC | PRN

## 2024-07-01 MED ORDER — LATANOPROST 0.005 % OP SOLN
1.0000 [drp] | Freq: Every morning | OPHTHALMIC | Status: DC
  Administered 2024-07-02 – 2024-07-05 (×4): 1 [drp] via OPHTHALMIC
  Filled 2024-07-01: qty 2.5

## 2024-07-01 MED ORDER — PANTOPRAZOLE SODIUM 40 MG PO TBEC
40.0000 mg | DELAYED_RELEASE_TABLET | Freq: Every day | ORAL | Status: DC
  Administered 2024-07-02 – 2024-07-05 (×4): 40 mg via ORAL
  Filled 2024-07-01 (×4): qty 1

## 2024-07-01 MED ORDER — DORZOLAMIDE HCL 2 % OP SOLN
1.0000 [drp] | Freq: Two times a day (BID) | OPHTHALMIC | Status: DC
  Administered 2024-07-02 – 2024-07-05 (×7): 1 [drp] via OPHTHALMIC
  Filled 2024-07-01: qty 10

## 2024-07-01 MED ORDER — FUROSEMIDE 10 MG/ML IJ SOLN
20.0000 mg | Freq: Two times a day (BID) | INTRAMUSCULAR | Status: AC
  Administered 2024-07-01 – 2024-07-02 (×2): 20 mg via INTRAVENOUS
  Filled 2024-07-01 (×2): qty 2

## 2024-07-01 NOTE — Telephone Encounter (Signed)
 Patient arrived to office visibly short of breath with purse lip breathing. Puffiness noted around the eyes and abdomen appeared swollen along with pedal edema. Recently discharged 06/24/24 for COPD/HF exacerbation.   She said that she felt like she was fluid overloaded still when she was discharged and that the diuretic doesn't appear to be working as well as it was. Son and home health nurse wanted to call EMS but patient refused.   Offered to see patient but since she was breathing so hard, I instructed her to go to the ER for further evaluation. Worsening kidney function is complicating diureses. Offered to take patient to [facility] ER since she was already here but she declined, saying that she would go back to [facility] ER. Son said that he would take her.

## 2024-07-01 NOTE — ED Notes (Signed)
 Transporting pt.

## 2024-07-01 NOTE — ED Provider Notes (Signed)
 [facility] DEPARTMENT AT [facility] Provider Note   CSN: [id number] Arrival date & time: 07/01/24  1213     Patient presents with: No chief complaint on file.   Jenny [name] is a 89 y.o. female.   The history is provided by the patient and medical records. No language interpreter was used.  Shortness of Breath Severity:  Severe Onset quality:  Gradual Duration:  1 day Timing:  Constant Progression:  Waxing and waning Chronicity:  Recurrent Context: not URI   Relieved by:  Nothing Worsened by:  Nothing Ineffective treatments:  None tried Associated symptoms: wheezing (resolved)   Associated symptoms: no abdominal pain, no chest pain, no cough, no fever, no headaches, no rash, no sputum production and no vomiting        Prior to Admission medications   Medication Sig Start Date End Date Taking? Authorizing Provider  acetaminophen  (TYLENOL ) 500 MG tablet Take 500 mg by mouth every 6 (six) hours as needed. Takes two tablets in the morning and two tablets at night    [provider]  albuterol  (VENTOLIN  HFA) 108 (90 Base) MCG/ACT inhaler INHALE ONE PUFF EVERY 6 HOURS AS NEEDED FOR SHORTNESS OF BREATH 11/03/21   Claudene Arthea SQUIBB, MD  apixaban  (ELIQUIS ) 2.5 MG TABS tablet Take 1 tablet (2.5 mg total) by mouth 2 (two) times daily. 11/10/23   Lelon Hamilton T, PA-C  dorzolamide  (TRUSOPT ) 2 % ophthalmic solution Place 1 drop into both eyes 2 (two) times daily.    [provider]  furosemide  (LASIX ) 40 MG tablet Take 1 tablet (40 mg total) by mouth daily. 06/25/24 09/23/24  Donette Ellouise LABOR, FNP  gabapentin  (NEURONTIN ) 100 MG capsule Take 100-200 mg by mouth See admin instructions. Take 1 capsule (100mg ) by mouth every morning and take 2 capsules (200mg ) by mouth at bedtime    [provider]  Glycopyrrolate -Formoterol  (BEVESPI  AEROSPHERE) 9-4.8 MCG/ACT AERO Inhale 2 puffs into the lungs 2 (two) times daily. 06/17/24   Rojelio Nest, DO   hydrOXYzine (ATARAX) 25 MG tablet Take 25 mg by mouth every 8 (eight) hours as needed for itching. 06/06/22   [provider]  ipratropium-albuterol  (DUONEB) 0.5-2.5 (3) MG/3ML SOLN Take 3 mLs by nebulization every 6 (six) hours as needed. 11/06/21   Patel, Sona, MD  latanoprost  (XALATAN ) 0.005 % ophthalmic solution Place 1 drop into both eyes in the morning.    [provider]  metolazone  (ZAROXOLYN ) 2.5 MG tablet Take 1 tablet (2.5 mg total) by mouth as needed. Patient taking differently: Take 2.5 mg by mouth as needed (edema). 02/19/24 06/16/24  Donette Ellouise LABOR, FNP  NITROSTAT  0.4 MG SL tablet DISSOLVE ONE TABLET UNDER TONGUE AS NEEDED FOR CHEST PAIN - MAY REPEAT TWICE-IF NO RELIEF GO TO NEAREST HOSPITAL ER 05/06/13   Inocencio Berwyn LABOR, MD  nystatin  (MYCOSTATIN /NYSTOP ) powder Apply topically 3 (three) times daily. 06/24/24   Ezenduka, Nkeiruka J, MD  pantoprazole  (PROTONIX ) 40 MG tablet Take 1 tablet (40 mg total) by mouth daily. 10/14/23   Lelon Hamilton T, PA-C  rosuvastatin  (CRESTOR ) 20 MG tablet Take 20 mg by mouth daily.    [provider]    Allergies: Aspirin , Entresto  [sacubitril -valsartan ], Atorvastatin , Cyclobenzaprine, Lactose intolerance (gi), Meperidine hcl, Pravastatin , Propoxyphene, and Wound dressing adhesive    Review of Systems  Constitutional:  Positive for fatigue. Negative for chills and fever.  HENT:  Negative for congestion.   Respiratory:  Positive for chest tightness, shortness of breath and wheezing (  resolved). Negative for cough and sputum production.   Cardiovascular:  Positive for leg swelling. Negative for chest pain and palpitations.  Gastrointestinal:  Negative for abdominal pain, constipation, diarrhea, nausea and vomiting.  Genitourinary:  Negative for dysuria.  Musculoskeletal:  Negative for back pain.  Skin:  Negative for rash and wound.  Neurological:  Negative for headaches.  Psychiatric/Behavioral:  Negative for agitation and  confusion.   All other systems reviewed and are negative.   Updated Vital Signs BP (!) 162/65 (BP Location: Right Arm)   Pulse (!) 53   Temp (!) 97.4 F (36.3 C) (Axillary)   Resp (!) 22   SpO2 100%   Physical Exam Vitals and nursing note reviewed.  Constitutional:      General: She is not in acute distress.    Appearance: She is well-developed. She is ill-appearing. She is not toxic-appearing or diaphoretic.  HENT:     Head: Normocephalic and atraumatic.     Nose: No congestion or rhinorrhea.     Mouth/Throat:     Mouth: Mucous membranes are moist.  Eyes:     Extraocular Movements: Extraocular movements intact.     Conjunctiva/sclera: Conjunctivae normal.     Pupils: Pupils are equal, round, and reactive to light.  Cardiovascular:     Rate and Rhythm: Bradycardia present. Rhythm irregular.     Heart sounds: No murmur heard. Pulmonary:     Effort: Pulmonary effort is normal. No respiratory distress.     Breath sounds: Wheezing, rhonchi and rales present.  Chest:     Chest wall: No tenderness.  Abdominal:     Palpations: Abdomen is soft.     Tenderness: There is no abdominal tenderness. There is no guarding or rebound.  Musculoskeletal:        General: No swelling or tenderness.     Cervical back: Neck supple. No tenderness.     Right lower leg: Edema present.     Left lower leg: Edema present.  Skin:    General: Skin is warm and dry.     Capillary Refill: Capillary refill takes less than 2 seconds.     Findings: No erythema or rash.  Neurological:     General: No focal deficit present.     Mental Status: She is alert.     Sensory: No sensory deficit.     Motor: No weakness.  Psychiatric:        Mood and Affect: Mood normal.     (all labs ordered are listed, but only abnormal results are displayed) Labs Reviewed  BRAIN NATRIURETIC PEPTIDE - Abnormal; Notable for the following components:      Result Value   B Natriuretic Peptide 257.5 (*)    All other  components within normal limits  CBC WITH DIFFERENTIAL/PLATELET - Abnormal; Notable for the following components:   WBC 11.2 (*)    RBC 3.71 (*)    Hemoglobin 10.9 (*)    HCT 35.1 (*)    RDW 15.9 (*)    Neutro Abs 9.5 (*)    Lymphs Abs 0.6 (*)    Abs Immature Granulocytes 0.15 (*)    All other components within normal limits  COMPREHENSIVE METABOLIC PANEL WITH GFR - Abnormal; Notable for the following components:   Potassium 3.3 (*)    Glucose, Bld 159 (*)    BUN 43 (*)    Creatinine, Ser 1.81 (*)    Calcium  6.6 (*)    Total Protein 6.2 (*)    Albumin  2.9 (*)    GFR, Estimated 27 (*)    All other components within normal limits  I-STAT CG4 LACTIC ACID, ED - Abnormal; Notable for the following components:   Lactic Acid, Venous 3.1 (*)    All other components within normal limits  TROPONIN I (HIGH SENSITIVITY) - Abnormal; Notable for the following components:   Troponin I (High Sensitivity) 40 (*)    All other components within normal limits  RESP PANEL BY RT-PCR (RSV, FLU A&B, COVID)  [id number]  URINALYSIS, W/ REFLEX TO CULTURE (INFECTION SUSPECTED)  I-STAT CG4 LACTIC ACID, ED  TROPONIN I (HIGH SENSITIVITY)    EKG: None  Radiology: DG Chest 2 View Result Date: 07/01/2024 CLINICAL DATA:  Shortness of breath. Weight gain. Concern for fluid retention. EXAM: CHEST - 2 VIEW COMPARISON:  06/16/2024 FINDINGS: Chronic cardiomegaly. Unchanged mediastinal contours. Aortic atherosclerosis. Chronic hyperinflation. Chronic peribronchial thickening. No convincing pleural effusions. No confluent airspace disease. No pneumothorax. IMPRESSION: 1. Chronic cardiomegaly.  Aortic Atherosclerosis (ICD10-I70.0). 2. Chronic hyperinflation and bronchial thickening. 3. No pleural effusions or convincing pulmonary edema. Electronically Signed   By: Andrea Gasman M.D.   On: 07/01/2024 14:20     Procedures   Medications Ordered in the ED  methylPREDNISolone  sodium succinate (SOLU-MEDROL ) 125 mg/2 mL  injection 125 mg (has no administration in time range)                                    Medical Decision Making Amount and/or Complexity of Data Reviewed Labs: ordered. Radiology: ordered.  Risk Prescription drug management. Decision regarding hospitalization.    Jenny [name] is a 89 y.o. female with a past medical history significant for diabetes, hypertension, hyperlipidemia, COPD, atrial fibrillation on Eliquis  therapy, CHF, CAD, and recent admission to the hospital for difficulty breathing who presents with worsened breathing.  Patient reports that she was having worsened breathing for last day or 2 and went to see her doctor today who saw her no sore distress and sent her to the emergency department valuation.  She was given a breathing treatment and route that she reports does not seem to help.  She does not take ox normally and is now on oxygen.  She was on 12 L and route and was de-escalated to about 6 L.  She had very faint wheezing but more so had rales and some rhonchi.  She is not reporting significant cough and is denying chest pain.  Just the shortness of breath.  She feels that she is fluid overloaded and has gained weight again.  She says her legs have some mild edema but she is more holding onto the weight and her torso and chest.  On exam, lungs have rales and rhonchi and very minimal wheezing.  Legs have minimal edema.  Chest is nontender and abdomen is nontender I did hear bowel sounds.  Intact pulses in extremities.  EKG does not show STEMI.  Patient's troponin is elevated compared to prior at 40, will trend.  BNP is elevated again at 257.  She does have a leukocytosis that seems to be new and has mild anemia.  Creatinine elevated at 1.81 similar to what it has been.  Her lactic acid is 3.1 and more elevated, will trend.  Her  chest x-ray shows chronic hyperinflation and bronchial thickening without convincing pleural effusions or pulmonary edema.  No large pneumonia or  pneumothorax seen.  Clinically concerned  about some fluid overload that is not completely reflected on the imaging versus COPD exacerbation.  Given the patient's worsened breathing and needing oxygen or respiratory distress, anticipate readmission.  Will discuss with patient and order some steroids and call for admission.       Final diagnoses:  Shortness of breath  Rales     Clinical Impression: 1. Shortness of breath   2. Rales     Disposition: Admit  This note was prepared with assistance of Dragon voice recognition software. Occasional wrong-word or sound-a-like substitutions may have occurred due to the inherent limitations of voice recognition software.      Lillard Bailon, Lonni PARAS, MD 07/01/24 [phone]

## 2024-07-01 NOTE — Progress Notes (Signed)
 The patient is brought from ED to 3 E. 25. A & O x 4. Denied any acute pain.  The patient is oriented to her call bell/ascom and staff. Full assessment to epic completed. Will continue to monitor.

## 2024-07-01 NOTE — ED Triage Notes (Signed)
 Pt bib ems from home; c/o sob x 3 hours; went to see MD for routine appt; MD instructed pt  to go to ED d/t being sob; 92-94% RA, placed on 12 L, sats 100%; expiratory wheezing in bilateral bases; 0.5 Atrovent given pta; 166/70, HR 58, RR 18; 100% neb; refused cbg; denies pain

## 2024-07-01 NOTE — H&P (Signed)
 History and Physical    Patient: Jenny [name] DOB: [birth year] DOA: 07/01/2024 DOS: the patient was seen and examined on 07/01/2024 . PCP: Ileen Rosaline NOVAK, NP  Patient coming from: Home Chief complaint: No chief complaint on file.  HPI:  Jenny [name] is a 89 y.o. female with past medical history  of  HFpEF, atrial fibrillation on Eliquis , COPD, hypertension, tachybradycardia syndrome, hyperlipidemia, CAD, presents to the ED with worsening SOB, dyspnea.  Patient on arrival noted to have pedal edema and was discharged on 06/24/2024 for CHF COPD and CHF exacerbation as well.  ED Course:  Vital signs in the ED were notable for the following:  Vitals:   07/01/24 1217 07/01/24 1522 07/01/24 1945  BP: (!) 162/65 (!) 149/62 (!) 138/53  Pulse: (!) 53 (!) 59 (!) 56  Temp: (!) 97.4 F (36.3 C) 98.2 F (36.8 C) 98.3 F (36.8 C)  Resp: (!) 22 (!) 29 18  SpO2: 100% 100% 96%  TempSrc: Axillary Oral   >>ED evaluation thus far shows: CMP shows hypokalemia of 3.3 glucose 159 CKD stage IIIb with a creatinine of 1.81 and EGFR 40, normal LFTs. BNP of 257.5, troponin of 40. CBC shows white count 11.2 hemoglobin 10.9 and platelets of 252. Lactic acid initially 2.1 recheck is 1.7. Troponin negative for flu, RSV and  COVID  >>06/22/2024 2 D Echo: 1. Left ventricular ejection fraction, by estimation, is 60 to 65%. The  left ventricle has normal function. The left ventricle has no regional  wall motion abnormalities. There is moderate asymmetric left ventricular  hypertrophy of the basal-septal  segment. Left ventricular diastolic parameters are indeterminate.   2. Right ventricular systolic function is normal. The right ventricular  size is mildly enlarged. There is mildly elevated pulmonary artery  systolic pressure. The estimated right ventricular systolic pressure is  43.7 mmHg.   3. Left atrial size was moderately dilated.   4. Right atrial size was moderately dilated.   5.  The mitral valve is degenerative. Trivial mitral valve regurgitation.  Moderate mitral stenosis. Severe mitral annular calcification. MG ,  MVA 1.4cm^2 by continuity equation.   6. The aortic valve is tricuspid. Aortic valve regurgitation is not  visualized. Aortic valve sclerosis/calcification is present, without any  evidence of aortic stenosis.     >>While in the ED patient received the following: Medications  methylPREDNISolone  sodium succinate (SOLU-MEDROL ) 125 mg/2 mL injection 125 mg (has no administration in time range)   Review of Systems  Respiratory:  Positive for shortness of breath.   Cardiovascular:  Positive for leg swelling.   Past Medical History:  Diagnosis Date   ACUT DUOD ULCER W/HEMORR&PERF W/O MENTION OBST 10/05/2009   NSAID induced   ALLERGIC RHINITIS CAUSE UNSPECIFIED    ANEMIA-NOS    CAD (coronary artery disease) 06/08/2009   DEs RCA with 70% LAD and EF 60%   CHF (congestive heart failure) (HCC)    COPD    mild obst on PFTs 03/2010   Diabetes mellitus 06/2010 dx   Mild, diet controlled   GLAUCOMA    HYPERLIPIDEMIA    HYPERTENSION, BENIGN    MYOCARDIAL INFARCTION 06/08/2009   des to rca   Persistent atrial fibrillation (HCC)    Dx 08/2021   TOBACCO ABUSE    Past Surgical History:  Procedure Laterality Date   CARDIOVERSION N/A 01/30/2022   Procedure: CARDIOVERSION;  Surgeon: Alveta Aleene [name], MD;  Location: [facility];  Service: Cardiovascular;  Laterality: N/A;   HEMORRHOID  SURGERY  1990   Right knee surgery      reports that she quit smoking about 2 years ago. Her smoking use included cigarettes. She started smoking about 62 years ago. She has a 30 pack-year smoking history. She has never used smokeless tobacco. She reports current alcohol use of about 1.0 - 2.0 standard drink of alcohol per week. She reports that she does not use drugs. Allergies  Allergen Reactions   Aspirin  Other (See Comments)    Can take 81 mg not 325 mg -bleeding    Entresto  [Sacubitril -Valsartan ] Shortness Of Breath    COPD and reports increased SOB with Entresto    Atorvastatin  Itching    Itching and myaliga  Other Reaction(s): itching, rash, Other (See Comments)   Cyclobenzaprine     Other reaction(s): muscle relaxers-ulcer hemorrhage (hospitalized)  Other Reaction(s): Other (See Comments), ulcer hemorrhage   Lactose Intolerance (Gi) Other (See Comments)    GI upset   Meperidine Hcl Other (See Comments)    Not known   Pravastatin  Rash    rash  Other Reaction(s): Other (See Comments)   Propoxyphene     Other reaction(s): pain meds, hallucination, vomiting  Other Reaction(s): Hallucination, hallucinations, vomiting, Other (See Comments), Unknown   Wound Dressing Adhesive     Other reaction(s): red, stings   Family History  Problem Relation Age of Onset   Hypertension Mother    Ulcers Mother    Stomach cancer Father    Stroke Maternal Grandmother    Heart attack Neg Hx    Prior to Admission medications   Medication Sig Start Date End Date Taking? Authorizing Provider  acetaminophen  (TYLENOL ) 500 MG tablet Take 500 mg by mouth every 6 (six) hours as needed. Takes two tablets in the morning and two tablets at night    [provider]  albuterol  (VENTOLIN  HFA) 108 (90 Base) MCG/ACT inhaler INHALE ONE PUFF EVERY 6 HOURS AS NEEDED FOR SHORTNESS OF BREATH 11/03/21   Claudene Arthea SQUIBB, MD  apixaban  (ELIQUIS ) 2.5 MG TABS tablet Take 1 tablet (2.5 mg total) by mouth 2 (two) times daily. 11/10/23   Lelon Hamilton T, PA-C  dorzolamide  (TRUSOPT ) 2 % ophthalmic solution Place 1 drop into both eyes 2 (two) times daily.    [provider]  furosemide  (LASIX ) 40 MG tablet Take 1 tablet (40 mg total) by mouth daily. 06/25/24 09/23/24  Donette Ellouise LABOR, FNP  gabapentin  (NEURONTIN ) 100 MG capsule Take 100-200 mg by mouth See admin instructions. Take 1 capsule (100mg ) by mouth every morning and take 2 capsules (200mg ) by mouth at bedtime     [provider]  Glycopyrrolate -Formoterol  (BEVESPI  AEROSPHERE) 9-4.8 MCG/ACT AERO Inhale 2 puffs into the lungs 2 (two) times daily. 06/17/24   Rojelio Nest, DO  hydrOXYzine (ATARAX) 25 MG tablet Take 25 mg by mouth every 8 (eight) hours as needed for itching. 06/06/22   [provider]  ipratropium-albuterol  (DUONEB) 0.5-2.5 (3) MG/3ML SOLN Take 3 mLs by nebulization every 6 (six) hours as needed. 11/06/21   Jovanni Rash, Sona, MD  latanoprost  (XALATAN ) 0.005 % ophthalmic solution Place 1 drop into both eyes in the morning.    [provider]  metolazone  (ZAROXOLYN ) 2.5 MG tablet Take 1 tablet (2.5 mg total) by mouth as needed. Patient taking differently: Take 2.5 mg by mouth as needed (edema). 02/19/24 06/16/24  Donette Ellouise LABOR, FNP  NITROSTAT  0.4 MG SL tablet DISSOLVE ONE TABLET UNDER TONGUE AS NEEDED FOR CHEST PAIN - MAY REPEAT TWICE-IF NO  RELIEF GO TO NEAREST HOSPITAL ER 05/06/13   Inocencio Berwyn LABOR, MD  nystatin  (MYCOSTATIN /NYSTOP ) powder Apply topically 3 (three) times daily. 06/24/24   Ezenduka, Nkeiruka J, MD  pantoprazole  (PROTONIX ) 40 MG tablet Take 1 tablet (40 mg total) by mouth daily. 10/14/23   Lelon Hamilton T, PA-C  rosuvastatin  (CRESTOR ) 20 MG tablet Take 20 mg by mouth daily.    [provider]                                                                                 Vitals:   07/01/24 1217 07/01/24 1522 07/01/24 1945  BP: (!) 162/65 (!) 149/62 (!) 138/53  Pulse: (!) 53 (!) 59 (!) 56  Resp: (!) 22 (!) 29 18  Temp: (!) 97.4 F (36.3 C) 98.2 F (36.8 C) 98.3 F (36.8 C)  TempSrc: Axillary Oral   SpO2: 100% 100% 96%   Physical Exam Vitals reviewed.  Constitutional:      General: She is not in acute distress.    Appearance: She is not ill-appearing.  HENT:     Head: Normocephalic and atraumatic.  Eyes:     Extraocular Movements: Extraocular movements intact.  Cardiovascular:     Rate and Rhythm: Normal rate. Rhythm irregular.     Heart  sounds: Normal heart sounds.  Pulmonary:     Breath sounds: Rales present.  Abdominal:     General: There is no distension.     Palpations: Abdomen is soft.     Tenderness: There is no abdominal tenderness.  Musculoskeletal:     Right lower leg: Edema present.     Left lower leg: Edema present.  Neurological:     General: No focal deficit present.     Mental Status: She is alert and oriented to person, place, and time.     Labs on Admission: I have personally reviewed following labs and imaging studies CBC: Recent Labs  Lab 07/01/24 1304  WBC 11.2*  NEUTROABS 9.5*  HGB 10.9*  HCT 35.1*  MCV 94.6  PLT 252   Basic Metabolic Panel: Recent Labs  Lab 07/01/24 1304  NA 139  K 3.3*  CL 98  CO2 27  GLUCOSE 159*  BUN 43*  CREATININE 1.81*  CALCIUM  6.6*   GFR: CrCl cannot be calculated (Unknown ideal weight.). Liver Function Tests: Recent Labs  Lab 07/01/24 1304  AST 17  ALT 21  ALKPHOS 86  BILITOT 1.2  PROT 6.2*  ALBUMIN 2.9*   No results for input(s): LIPASE, AMYLASE in the last 168 hours. No results for input(s): AMMONIA in the last 168 hours. Coagulation Profile: No results for input(s): INR, PROTIME in the last 168 hours. Cardiac Enzymes: No results for input(s): CKTOTAL, CKMB, CKMBINDEX, TROPONINI in the last 168 hours. BNP (last 3 results) No results for input(s): PROBNP in the last 8760 hours. HbA1C: No results for input(s): HGBA1C in the last 72 hours. CBG: No results for input(s): GLUCAP in the last 168 hours. Lipid Profile: No results for input(s): CHOL, HDL, LDLCALC, TRIG, CHOLHDL, LDLDIRECT in the last 72 hours. Thyroid  Function Tests: No results for input(s): TSH, T4TOTAL, FREET4, T3FREE, THYROIDAB in the last 72 hours. Anemia  Panel: No results for input(s): VITAMINB12, FOLATE, FERRITIN, TIBC, IRON , RETICCTPCT in the last 72 hours. Urine analysis: No results found for: COLORURINE,  APPEARANCEUR, LABSPEC, PHURINE, GLUCOSEU, HGBUR, BILIRUBINUR, KETONESUR, PROTEINUR, UROBILINOGEN, NITRITE, LEUKOCYTESUR Radiological Exams on Admission: DG Chest 2 View Result Date: 07/01/2024 CLINICAL DATA:  Shortness of breath. Weight gain. Concern for fluid retention. EXAM: CHEST - 2 VIEW COMPARISON:  06/16/2024 FINDINGS: Chronic cardiomegaly. Unchanged mediastinal contours. Aortic atherosclerosis. Chronic hyperinflation. Chronic peribronchial thickening. No convincing pleural effusions. No confluent airspace disease. No pneumothorax. IMPRESSION: 1. Chronic cardiomegaly.  Aortic Atherosclerosis (ICD10-I70.0). 2. Chronic hyperinflation and bronchial thickening. 3. No pleural effusions or convincing pulmonary edema. Electronically Signed   By: Andrea Gasman M.D.   On: 07/01/2024 14:20   Data Reviewed: Relevant notes from primary care and specialist visits, past discharge summaries as available in EHR, including Care Everywhere . Prior diagnostic testing as pertinent to current admission diagnoses, Updated medications and problem lists for reconciliation .ED course, including vitals, labs, imaging, treatment and response to treatment,Triage notes, nursing and pharmacy notes and ED provider's notes.Notable results as noted in HPI.Discussed case with EDMD/ ED APP/ or Specialty MD on call and as needed.  Assessment & Plan  >> Shortness of breath/acute respiratory failure with hypoxia/acute on chronic HFpEF vs COPD: SpO2: 96 % O2 Flow Rate (L/min): 2 L/min Patient's most recent echocardiogram done that alerted this month shows EF of 60 to 65% with biatrial dilatation and mild pulmonary elevated pressures which I suspect patient has underlying right-sided heart failure / diastolic dysfunction. Suspect CHF>COPD.  Patient started on Lasix  20 mg injectable. Strict I's and O's and daily weights. Also continue with the DuoNeb and as needed albuterol . Pt did receive solumedrol in ed and  will change to quick taper of prednisone  daily.   >> CAD/hyperlipidemia: Will continue patient on her Eliquis  and statin therapy.  >> CKD stage IIIb: Lab Results  Component Value Date   CREATININE 1.81 (H) 07/01/2024   CREATININE 1.74 (H) 06/24/2024   CREATININE 1.63 (H) 06/23/2024  Avoid contrast and renally dose meds.     >> Controlled diabetes mellitus type 2: Last A1c of 6.6. will offer glycemic protocol if pt is receptive to new diagnosis.Although I am afraid that using sliding scale insulin  may create anxiety. Will request daytime team md to d/w pt about starting therapy and her primary care. Will request new diabetic teaching and supplies.    >> Paroxysmal atrial fibrillation: Patient is currently in A-fib on EKG.  Will continue with Eliquis , CHA2DS2/VAS Stroke Risk Points  Current as of 14 minutes ago     7 >= 2 Points: High Risk  1 to 1.99 Points: Medium Risk  0 Points: Low Risk    Last Change: N/A     Details    This score determines the patient's risk of having a stroke if the  patient has atrial fibrillation.  Points Metrics  1 Has Congestive Heart Failure:  Yes    Current as of 14 minutes ago  1 Has Vascular Disease:  Yes    Current as of 14 minutes ago  1 Has Hypertension:  Yes    Current as of 14 minutes ago  2 Age:  16    Current as of 14 minutes ago  1 Has Diabetes Excluding Gestational Diabetes:  Yes    Current as of 14 minutes ago  0 Had Stroke:  No  Had TIA:  No  Had Thromboembolism:  No    Current as  of 14 minutes ago  1 Female:  Yes    Current as of 14 minutes ago        DVT prophylaxis:  Eliquis  Consults:  None  Advance Care Planning:    Code Status: Full Code   Family Communication:  None Disposition Plan:  Home Severity of Illness: The appropriate patient status for this patient is INPATIENT. Inpatient status is judged to be reasonable and necessary in order to provide the required intensity of service to ensure the patient's  safety. The patient's presenting symptoms, physical exam findings, and initial radiographic and laboratory data in the context of their chronic comorbidities is felt to place them at high risk for further clinical deterioration. Furthermore, it is not anticipated that the patient will be medically stable for discharge from the hospital within 2 midnights of admission.   * I certify that at the point of admission it is my clinical judgment that the patient will require inpatient hospital care spanning beyond 2 midnights from the point of admission due to high intensity of service, high risk for further deterioration and high frequency of surveillance required.*  Unresulted Labs (From admission, onward)     Start     Ordered   07/02/24 0500  Comprehensive metabolic panel  Tomorrow morning,   R        07/01/24 1602   07/02/24 0500  CBC  Tomorrow morning,   R        07/01/24 1602   07/01/24 1305  Urinalysis, w/ Reflex to Culture (Infection Suspected) -Urine, Clean Catch  Once,   URGENT       Question:  Specimen Source  Answer:  Urine, Clean Catch   07/01/24 1304            Meds ordered this encounter  Medications   methylPREDNISolone  sodium succinate (SOLU-MEDROL ) 125 mg/2 mL injection 125 mg    IV methylprednisolone  will be converted to either a q12h or q24h frequency with the same total daily dose (TDD).  Ordered Dose: 1 to 125 mg TDD; convert to: TDD q24h.  Ordered Dose: 126 to 250 mg TDD; convert to: TDD div q12h.  Ordered Dose: >250 mg TDD; DAW.   dorzolamide  (TRUSOPT ) 2 % ophthalmic solution 1 drop   DISCONTD: gabapentin  (NEURONTIN ) capsule 100-200 mg    Take 1 capsule (100mg ) by mouth every morning and take 2 capsules (200mg ) by mouth at bedtime     latanoprost  (XALATAN ) 0.005 % ophthalmic solution 1 drop   rosuvastatin  (CRESTOR ) tablet 20 mg   ipratropium-albuterol  (DUONEB) 0.5-2.5 (3) MG/3ML nebulizer solution 3 mL   furosemide  (LASIX ) injection 20 mg   sodium chloride  flush  (NS) 0.9 % injection 3 mL   OR Linked Order Group    acetaminophen  (TYLENOL ) tablet 650 mg    acetaminophen  (TYLENOL ) suppository 650 mg   nicotine  (NICODERM CQ  - dosed in mg/24 hours) patch 14 mg   hydrALAZINE  (APRESOLINE ) injection 5 mg   gabapentin  (NEURONTIN ) capsule 100 mg   gabapentin  (NEURONTIN ) capsule 200 mg   insulin  aspart (novoLOG ) injection 0-9 Units    Correction coverage::   Sensitive (thin, NPO, renal)    CBG < 70::   Implement Hypoglycemia Standing Orders and refer to Hypoglycemia Standing Orders sidebar report    CBG 70 - 120::   0 units    CBG 121 - 150::   1 unit    CBG 151 - 200::   2 units    CBG 201 - 250::  3 units    CBG 251 - 300::   5 units    CBG 301 - 350::   7 units    CBG 351 - 400:   9 units    CBG > 400:   call MD and obtain STAT lab verification     Orders Placed This Encounter  Procedures   Resp panel by RT-PCR (RSV, Flu A&B, Covid) Anterior Nasal Swab   DG Chest 2 View   Brain natriuretic peptide   CBC with Differential   Comprehensive metabolic panel   Urinalysis, w/ Reflex to Culture (Infection Suspected) -Urine, Clean Catch   Comprehensive metabolic panel   CBC   Diet Carb Modified Fluid consistency: Thin; Room service appropriate? Yes   ED Cardiac monitoring   Notify physician (specify)   Initiate Heart Failure Care Plan   Daily weights   Strict intake and output   In and Out Cath   Patient Education:   Apply Heart Failure Care Plan   [facility] and AP only) Obtain REDS clips reading Every morning   Cardiac Monitoring Continuous x 24 hours Indications for use: Sub-acute heart failure   Maintain IV access   Vital signs   Notify physician (specify)   Mobility Protocol: No Restrictions   Refer to Sidebar Report Mobility Protocol for Adult Inpatient   Initiate Adult Central Line Maintenance and Catheter Protocol for patients with central line (CVC, PICC, Port, Hemodialysis, Trialysis)   If patient diabetic or glucose greater than 140  notify physician for Sliding Scale Insulin  Orders   Initiate CHG Protocol   Do not place and if present remove PureWick   Initiate Oral Care Protocol   Initiate Carrier Fluid Protocol   RN may order General Admission PRN Orders utilizing General Admission PRN medications (through manage orders) for the following patient needs: allergy symptoms (Claritin), cold sores (Carmex), cough (Robitussin DM), eye irritation (Liquifilm Tears), hemorrhoids (Tucks), indigestion (Maalox), minor skin irritation (Hydrocortisone Cream), muscle pain (Ben Gay), nose irritation (saline nasal spray) and sore throat (Chloraseptic spray).   Apply Diabetes Mellitus Care Plan   STAT CBG when hypoglycemia is suspected. If treated, recheck every 15 minutes after each treatment until CBG >/= 70 mg/dl   Refer to Hypoglycemia Protocol Sidebar Report for treatment of CBG < 70 mg/dl   No HS correction Insulin    Full code   Consult to hospitalist   Consult to Transition of Care Team   Consult to Heart Failure Navigation Team [facility], [facility], and [facility])   Nutritional services consult   Consult to diabetes coordinator   Pulse oximetry check with vital signs   Oxygen therapy Mode or (Route): Nasal cannula; Liters Per Minute: 2; Keep O2 saturation between: greater than 92 %   Incentive spirometry   I-Stat CG4 Lactic Acid   ED EKG   Insert peripheral IV   Admit to Inpatient (patient's expected length of stay will be greater than 2 midnights or inpatient only procedure)    Author: Mario LULLA Blanch, MD 12 pm- 8 pm. [facility]. 07/01/2024 8:30 PM Please note for any communication after hours contact [facility] Assigned provider on call on Amion.

## 2024-07-01 NOTE — Progress Notes (Signed)
 The patient just told this RN that she takes Eliquis   2.5 mg twice a  day and she wants it tonight. She's refusing  CBG check. Stated she has never prick her finger before and tonight will not be the first tine to start it. Added, I'm not diabetic.  Notified Dr. Tobie for her CBG check refusal and the need for her Eliquis . Order placed for Eliquis .  MD replied, encourage that she discuss her refusal with her pcp esp as she has heart failure. Message conveyed to the patient and she verbalized understanding. Will continue to monitor.

## 2024-07-02 DIAGNOSIS — N179 Acute kidney failure, unspecified: Secondary | ICD-10-CM | POA: Diagnosis not present

## 2024-07-02 DIAGNOSIS — I272 Pulmonary hypertension, unspecified: Secondary | ICD-10-CM

## 2024-07-02 DIAGNOSIS — R0600 Dyspnea, unspecified: Secondary | ICD-10-CM | POA: Diagnosis not present

## 2024-07-02 DIAGNOSIS — R0602 Shortness of breath: Principal | ICD-10-CM

## 2024-07-02 DIAGNOSIS — J9601 Acute respiratory failure with hypoxia: Secondary | ICD-10-CM

## 2024-07-02 DIAGNOSIS — I5032 Chronic diastolic (congestive) heart failure: Secondary | ICD-10-CM | POA: Diagnosis not present

## 2024-07-02 DIAGNOSIS — J449 Chronic obstructive pulmonary disease, unspecified: Secondary | ICD-10-CM

## 2024-07-02 DIAGNOSIS — N1832 Chronic kidney disease, stage 3b: Secondary | ICD-10-CM | POA: Diagnosis not present

## 2024-07-02 LAB — COMPREHENSIVE METABOLIC PANEL WITH GFR
ALT: 20 U/L (ref 0–44)
AST: 21 U/L (ref 15–41)
Albumin: 2.7 g/dL — ABNORMAL LOW (ref 3.5–5.0)
Alkaline Phosphatase: 90 U/L (ref 38–126)
Anion gap: 15 (ref 5–15)
BUN: 47 mg/dL — ABNORMAL HIGH (ref 8–23)
CO2: 25 mmol/L (ref 22–32)
Calcium: 6.7 mg/dL — ABNORMAL LOW (ref 8.9–10.3)
Chloride: 95 mmol/L — ABNORMAL LOW (ref 98–111)
Creatinine, Ser: 2.15 mg/dL — ABNORMAL HIGH (ref 0.44–1.00)
GFR, Estimated: 22 mL/min — ABNORMAL LOW (ref >60)
Glucose, Bld: 355 mg/dL — ABNORMAL HIGH (ref 70–99)
Potassium: 3.8 mmol/L (ref 3.5–5.1)
Sodium: 135 mmol/L (ref 135–145)
Total Bilirubin: 1 mg/dL (ref 0.0–1.2)
Total Protein: 6 g/dL — ABNORMAL LOW (ref 6.5–8.1)

## 2024-07-02 LAB — CBC
HCT: 32.2 % — ABNORMAL LOW (ref 36.0–46.0)
Hemoglobin: 10.2 g/dL — ABNORMAL LOW (ref 12.0–15.0)
MCH: 29.1 pg (ref 26.0–34.0)
MCHC: 31.7 g/dL (ref 30.0–36.0)
MCV: 92 fL (ref 80.0–100.0)
Platelets: 248 K/uL (ref 150–400)
RBC: 3.5 MIL/uL — ABNORMAL LOW (ref 3.87–5.11)
RDW: 15.9 % — ABNORMAL HIGH (ref 11.5–15.5)
WBC: 9.2 K/uL (ref 4.0–10.5)
nRBC: 0 % (ref 0.0–0.2)

## 2024-07-02 MED ORDER — FUROSEMIDE 10 MG/ML IJ SOLN
40.0000 mg | Freq: Two times a day (BID) | INTRAMUSCULAR | Status: AC
  Administered 2024-07-02 (×2): 40 mg via INTRAVENOUS
  Filled 2024-07-02 (×2): qty 4

## 2024-07-02 MED ORDER — POTASSIUM CHLORIDE CRYS ER 20 MEQ PO TBCR
40.0000 meq | EXTENDED_RELEASE_TABLET | Freq: Once | ORAL | Status: AC
  Administered 2024-07-02: 40 meq via ORAL
  Filled 2024-07-02: qty 2

## 2024-07-02 MED ORDER — LEVALBUTEROL HCL 0.63 MG/3ML IN NEBU
0.6300 mg | INHALATION_SOLUTION | Freq: Four times a day (QID) | RESPIRATORY_TRACT | Status: DC
  Administered 2024-07-02 – 2024-07-05 (×11): 0.63 mg via RESPIRATORY_TRACT
  Filled 2024-07-02 (×11): qty 3

## 2024-07-02 MED ORDER — IPRATROPIUM BROMIDE 0.02 % IN SOLN
0.5000 mg | Freq: Four times a day (QID) | RESPIRATORY_TRACT | Status: DC
  Administered 2024-07-02 – 2024-07-05 (×11): 0.5 mg via RESPIRATORY_TRACT
  Filled 2024-07-02 (×11): qty 2.5

## 2024-07-02 MED ORDER — GUAIFENESIN ER 600 MG PO TB12
1200.0000 mg | ORAL_TABLET | Freq: Two times a day (BID) | ORAL | Status: DC
  Administered 2024-07-02 – 2024-07-05 (×7): 1200 mg via ORAL
  Filled 2024-07-02 (×7): qty 2

## 2024-07-02 NOTE — Progress Notes (Signed)
 Pt is refusing CBG checks. Sliding scale insulin  not given.

## 2024-07-02 NOTE — Progress Notes (Signed)
 Mobility Specialist Progress Note:   07/02/24 1100  Mobility  Activity Ambulated with assistance in hallway  Level of Assistance Contact guard assist, steadying assist  Assistive Device Four wheel walker  Distance Ambulated (ft) 300 ft  Activity Response Tolerated well  Mobility Referral Yes  Mobility visit 1 Mobility  Mobility Specialist Start Time (ACUTE ONLY) 1100  Mobility Specialist Stop Time (ACUTE ONLY) 1120  Mobility Specialist Time Calculation (min) (ACUTE ONLY) 20 min   Pt agreeable to mobility session. Handled ambulating on RA well with SpO2 >90%. X2 seated rest breaks taken d/t fatigue. Pt back sitting EOB with all needs met.   Therisa Rana Mobility Specialist Please contact via SecureChat or  Rehab office at [phone]

## 2024-07-02 NOTE — Evaluation (Signed)
 Physical Therapy Evaluation Patient Details Name: Jenny [name] MRN: [id number] DOB: 09-12-1933 Today's Date: 07/02/2024  History of Present Illness  89 yo female admitted 07/01/2024 from home with SOB, admitted for CHF exacerbation. PMH: HFpEF, CKD, HTN, T2DM, COPD, Afib on Eliquis , CAD, HLD  Clinical Impression  Pt presents to PT with deficits in endurance and cardiopulmonary function. Pt is able to transfer and ambulate for household distances with support of a Rollator, SpO2 stable on room air. PT encourages frequent mobilization in an effort to improve activity tolerance. PT recommends HHPT at the time of discharge.        If plan is discharge home, recommend the following: Assistance with cooking/housework;Assist for transportation   Can travel by private vehicle        Equipment Recommendations None recommended by PT  Recommendations for Other Services       Functional Status Assessment Patient has had a recent decline in their functional status and demonstrates the ability to make significant improvements in function in a reasonable and predictable amount of time.     Precautions / Restrictions Precautions Precautions: None Restrictions Weight Bearing Restrictions Per Provider Order: No      Mobility  Bed Mobility Overal bed mobility: Modified Independent                  Transfers Overall transfer level: Modified independent Equipment used: Rollator (4 wheels) Transfers: Sit to/from Stand Sit to Stand: Modified independent (Device/Increase time)                Ambulation/Gait Ambulation/Gait assistance: Modified independent (Device/Increase time) Gait Distance (Feet): 150 Feet (150' x 3 with 2 long seated rest breaks) Assistive device: Rollator (4 wheels) Gait Pattern/deviations: Step-through pattern Gait velocity: functional Gait velocity interpretation: 1.31 - 2.62 ft/sec, indicative of limited community ambulator   General Gait Details:  step-through gait, mild increase in trunk flexion  Stairs            Wheelchair Mobility     Tilt Bed    Modified Rankin (Stroke Patients Only)       Balance Overall balance assessment: Needs assistance Sitting-balance support: No upper extremity supported, Feet supported Sitting balance-Leahy Scale: Good     Standing balance support: Single extremity supported, Reliant on assistive device for balance Standing balance-Leahy Scale: Poor                               Pertinent Vitals/Pain Pain Assessment Pain Assessment: No/denies pain    Home Living Family/patient expects to be discharged to:: Private residence Living Arrangements: Alone Available Help at Discharge: Family;Neighbor;Available PRN/intermittently Type of Home: House Home Access: Ramped entrance       Home Layout: One level Home Equipment: Rollator (4 wheels);BSC/3in1;Tub bench;Grab bars - tub/shower      Prior Function Prior Level of Function : Independent/Modified Independent             Mobility Comments: ambulatory with rollator, pt reports her son took her car keys from her       Extremity/Trunk Assessment   Upper Extremity Assessment Upper Extremity Assessment: Overall WFL for tasks assessed    Lower Extremity Assessment Lower Extremity Assessment: Overall WFL for tasks assessed    Cervical / Trunk Assessment Cervical / Trunk Assessment: Kyphotic  Communication   Communication Communication: No apparent difficulties    Cognition Arousal: Alert Behavior During Therapy: WFL for tasks assessed/performed   PT -  Cognitive impairments: No apparent impairments                         Following commands: Intact       Cueing Cueing Techniques: Verbal cues     General Comments General comments (skin integrity, edema, etc.): VSS on RA, sats in mid 90s when ambulating    Exercises     Assessment/Plan    PT Assessment Patient needs continued PT  services  PT Problem List Decreased activity tolerance;Cardiopulmonary status limiting activity       PT Treatment Interventions DME instruction;Therapeutic exercise;Therapeutic activities;Patient/family education    PT Goals (Current goals can be found in the Care Plan section)  Acute Rehab PT Goals Patient Stated Goal: to return to independence, improve activity tolerance PT Goal Formulation: With patient Time For Goal Achievement: 07/16/24 Potential to Achieve Goals: Fair Additional Goals Additional Goal #1: Pt will demonstrate the ability to ambulate for >500' reporting 1/4 DOE or less    Frequency Min 1X/week     Co-evaluation               AM-PAC PT 6 Clicks Mobility  Outcome Measure Help needed turning from your back to your side while in a flat bed without using bedrails?: None Help needed moving from lying on your back to sitting on the side of a flat bed without using bedrails?: None Help needed moving to and from a bed to a chair (including a wheelchair)?: None Help needed standing up from a chair using your arms (e.g., wheelchair or bedside chair)?: None Help needed to walk in hospital room?: None Help needed climbing 3-5 steps with a railing? : A Little 6 Click Score: 23    End of Session   Activity Tolerance: Patient tolerated treatment well Patient left: in bed;with call bell/phone within reach Nurse Communication: Mobility status PT Visit Diagnosis: Other abnormalities of gait and mobility (R26.89)    Time: 8347-8283 PT Time Calculation (min) (ACUTE ONLY): 24 min   Charges:   PT Evaluation $PT Eval Low Complexity: 1 Low   PT General Charges $$ ACUTE PT VISIT: 1 Visit         [name], PT, DPT Acute Rehabilitation Office [phone]   [name] 07/02/2024, 5:21 PM

## 2024-07-02 NOTE — Inpatient Diabetes Management (Signed)
 Inpatient Diabetes Program Recommendations  AACE/ADA: New Consensus Statement on Inpatient Glycemic Control (2015)  Target Ranges:  Prepandial:   less than 140 mg/dL      Peak postprandial:   less than 180 mg/dL (1-2 hours)      Critically ill patients:  140 - 180 mg/dL   Lab Results  Component Value Date   GLUCAP 114 (H) 09/28/2023   HGBA1C 6.6 (H) 06/18/2024    Review of Glycemic Control  Diabetes history: DM2 Outpatient Diabetes medications: none Current orders for Inpatient glycemic control: Novolog  0-9 units correction scale TID  Inpatient Diabetes Program Recommendations:   Spoke with patient at the bedside about her blood sugars. Discussed her HgbA1C of 6.6% which has improved since her last one in 2024 of 6.9%. States that she has not been told that she has diabetes; explained to her that 6.5% A1C is considered a diagnosis of diabetes. She states that she is terrified of finger sticks and that she has never had anyone stick her finger, even during her last admission  on 06/16/24 to 06/24/24. Explained that her blood sugars can be elevated with the steroids that she is on in the hospital.  Patient has a PCP and has someone with [facility] visiting her every week. They do lab work from time to time when necessary. Patient states that she eats one meal per day. Suggested that she watch the CHO content of her foods; suggested that she eat more vegetables, lean meats, and some fruit. She usually drinks water, occasional soda and coffee.   Discussed briefly about using a continuous glucose monitor instead of finger sticks; states that she does not have a smart phone. Informed her that a reader can be ordered to use. Patient would like some information on the CGM to read and take with her to her physician.  Will continue to monitor blood sugars while in the hospital.  Marjorie Lunger RN BSN CDE Diabetes Coordinator Pager: [phone]  8am-5pm

## 2024-07-02 NOTE — Hospital Course (Addendum)
 Patient is an 89 year old obese Caucasian female with a past medical history significant for but not limited to chronic diastolic CHF, atrial fibrillation on anticoagulation with Eliquis , COPD, hypertension, tachybradycardia syndrome, hyperlipidemia, CAD and other comorbidities who presented to the ED with worsening shortness of breath and dyspnea.  Patient was recently admitted and discharged on 7/17 for CHF and COPD exacerbation.  She presented with worsening dyspnea and states that she has had some dietary indiscretion and admitted to eating [employer] since being discharged.  Thinks she got more fluid now.  Recently had a repeat echocardiogram done about 10 days ago.  Given her worsening leg swelling and shortness of breath she presented to the ED for further evaluation and is being admitted for acute respiratory failure with hypoxia in the setting of likely CHF exacerbation.  She is currently being diuresed and cardiology been consulted for further evaluation recommendations and planning RHC on 7/28.  Assessment and Plan:  Acute respiratory failure with hypoxia in the setting of acute on chronic diastolic CHF as well as ? COPD component: Presents with worsening dyspnea on exertion and leg swelling.  Recently had an echocardiogram which showed an EF of 60 to 65% with biatrial dilation and mild pulmonary artery pressures.  Currently she is not wheezing and denies any increase in her sputum production.  She was started on IV Lasix  20 mg and increased increase diuresis to 40 mg twice daily and Cardiology recommending continuing this Today.  Strict I's and O's and daily weights will need to monitor volume status.  She did receive Solu-Medrol  in the ED but is now on a prednisone  taper however unlikely that this is COPD. Allegedly her dry weight is 195 lbs. Will continue Xopenex  and Atrovent  every 6 scheduled and we will order flutter valve, incentive spirometer and add guaifenesin  1200 mg twice daily.  Will need  repeat chest x-ray in the a.m. cardiology been consulted for further evaluation and are tentatively planning a right heart catheterization for further evaluation delineation; Cardiology is transitioning her to Heparin  gtt in anticipation of RHC on 07/05/24  COPD with prior history of tobacco abuse: Currently not wheezing and has quit smoking about 3 years ago.  Continue monitor respiratory status and continue nebs as above and guaifenesin .  Do not feel strong indication for systemic IV steroids but will continue the Prednisone  40 mg that was started x 5 Days. C/w Xopenex /Atrovent  q6h for now and C/w Guaifenesin  1200 mg po BID  Persistent atrial fibrillation with SVR and bradycardia: She is a CHA2DS2-VASc score of 7 and will continue monitor on telemetry; Continued with Apixaban  2.5 mg  p.o. twice daily but Cardiology is now   CAD status post stenting with DES to the right RCA /close mildly elevated troponins: Troponin trend is flat and elevation is likely secondary to demand ischemia from pulmonary hypertension and volume overload.  Continuing to monitor for any signs and symptoms of chest pain.  Continuing Rosuvastatin  20 mg p.o. daily and she is not on aspirin  due to her being on apixaban  for atrial fibrillation; No current Angina   Hyperlipidemia: Continue Rosuvastatin  20 mg p.o. daily  AKI on CKD stage IIIb: Baseline creatinine is around 1.4 BUN/Cr Trend: Recent Labs  Lab 06/21/24 0520 06/22/24 0431 06/23/24 0357 06/24/24 0437 07/01/24 1304 07/02/24 0242 07/03/24 0334  BUN 62* 61* 56* 63* 43* 47* 50*  CREATININE 2.00* 1.85* 1.63* 1.74* 1.81* 2.15* 2.02*  -Cautious Diuresis as above and Cardiology giving her IV Fuorsemide 40 mg x2 doses  today  -Avoid Nephrotoxic Medications, Contrast Dyes, Hypotension and Dehydration to Ensure Adequate Renal Perfusion and will need to Renally Adjust Meds -Continue to Monitor and Trend Renal Function carefully and repeat CMP in the AM   Diabetes Mellitus  Type 2:  Hemoglobin A1c was 6.6. C/w Sensitive Novolog  SSI AC. CTM CBGs per Protocol and current Trend: Recent Labs  Lab 07/03/24 1034  GLUCAP 269*   Leukocytosis: WBC went from 9.2 -> 17.7 in the setting of Steroid Demargination. Received IV Solumedrol 125 mg in the AM and now on Prednisone  40 mg po Daily x5 Days. CTM for S/Sx of Infection. Repeat CBC in the AM  Normocytic Anemia: Hgb/Hct Trend:  Recent Labs  Lab 06/16/24 1441 06/17/24 0410 07/01/24 1304 07/02/24 0242 07/03/24 0334  HGB 11.6* 11.4* 10.9* 10.2* 9.9*  HCT 35.3* 35.5* 35.1* 32.2* 30.6*  MCV 92.2 92.0 94.6 92.0 90.0  -Check Anemia Panel in the AM. CTM for S/Sx of Bleeding; No overt bleeding noted. Repeat CBC in the AM  GERD/GI Prophylaxis: C/w PPI w/ Pantoprazole  40 mg po daily   Hypoalbuminemia: Patient's Albumin Lvls ranging from 2.7-3.0. (Was 2.8 this AM) CTM and Trend and repeat CMP in the AM  Class II Obesity: Complicates overall prognosis and care. Estimated body mass index is 36.85 kg/m as calculated from the following:   Height as of this encounter: 5' 2 (1.575 m).   Weight as of this encounter: 91.4 kg. Weight Loss and Dietary Counseling given

## 2024-07-02 NOTE — Progress Notes (Signed)
 Initial Nutrition Assessment  DOCUMENTATION CODES:   Obesity unspecified  INTERVENTION:  -Liberalize diet to regular menu to promote adequate intake -Encouraged pt to consume adequate kcal/pro intake -Encouraged pt to follow up with PCP regarding new DM2 dx   NUTRITION DIAGNOSIS:   Food and nutrition related knowledge deficit related to limited prior education as evidenced by per patient/family report.   GOAL:   Patient will meet greater than or equal to 90% of their needs   MONITOR:   PO intake, Weight trends, Labs, Skin  REASON FOR ASSESSMENT:   Consult Assessment of nutrition requirement/status  ASSESSMENT:   Hx HFpEF, atrial fibrillation on Eliquis , COPD, hypertension, tachybradycardia syndrome, hyperlipidemia, CAD, presents to the ED with worsening SOB, dyspnea. Recently admitted here for similar complaints. Noted to have edema.  Spoke to pt at bedside. Pt denies n/v/c/d or chewing/swallowing difficulties. Last BM PTA. 1 documented meal at 100%. Pt states at home she typically only consumes 1 meal per day, and has done this for years. Weight relatively stable. Pt with A1c 6.6%, improved from previous A1c of 6.9% in 2024. Pt declines to speak about DM2 diet information, states Diabetes Coordinator had been there twice already today and that she is 89 years old, not interested in changing her diet now. Validation given for pt's feelings. Discussed following up with PCP and home health nurse that she trusts very much. Encouraged pt to consume adequate kcal/pro. Briefly discussed high/low BG symptoms to watch for. Pt verbalized understanding, denies additional questions/concerns at this time, will continue to monitor, RDN available prn.   Labs Chloride BG 355 BUN 47 Creat 2.15 Calcium  6.7 Albumin 2.7 GFR 22 H/H 10.2/32.2  Medications  apixaban   2.5 mg Oral BID   dorzolamide   1 drop Both Eyes BID   furosemide   40 mg Intravenous Q12H   gabapentin   100 mg Oral q AM    gabapentin   200 mg Oral QHS   guaiFENesin  1,200 mg Oral BID   insulin  aspart  0-9 Units Subcutaneous TID WC   ipratropium  0.5 mg Nebulization Q6H   latanoprost   1 drop Both Eyes q AM   levalbuterol  0.63 mg Nebulization Q6H   pantoprazole   40 mg Oral Daily   predniSONE   40 mg Oral Q breakfast   rosuvastatin   20 mg Oral Daily   sodium chloride  flush  3 mL Intravenous Q12H     NUTRITION - FOCUSED PHYSICAL EXAM:  Flowsheet Row Most Recent Value  Orbital Region Mild depletion  Upper Arm Region No depletion  Thoracic and Lumbar Region No depletion  Buccal Region No depletion  Temple Region Mild depletion  Clavicle Bone Region No depletion  Clavicle and Acromion Bone Region No depletion  Scapular Bone Region No depletion  Dorsal Hand No depletion  Patellar Region Mild depletion  Anterior Thigh Region Mild depletion  Posterior Calf Region Mild depletion  Edema (RD Assessment) Mild  Hair Reviewed  Eyes Reviewed  Mouth Reviewed  Skin Reviewed  Nails Reviewed    Diet Order:   Diet Order             Diet Carb Modified Fluid consistency: Thin; Room service appropriate? Yes  Diet effective now                   EDUCATION NEEDS:   Education needs have been addressed  Skin:  Skin Assessment: Skin Integrity Issues: Skin Integrity Issues:: Stage I Stage I: Sacrum  Last BM:  PTA  Height:  Ht Readings from Last 1 Encounters:  06/16/24 5' 2 (1.575 m)    Weight:   Wt Readings from Last 1 Encounters:  07/02/24 92.1 kg   BMI:  Body mass index is 37.14 kg/m.  Estimated Nutritional Needs:   Kcal:  1500-1800 kcal  Protein:  75-100 g  Fluid:  1500 ml    Jenny [name], RDN, LDN Registered Dietitian Nutritionist RD Inpatient Contact Info in [place]

## 2024-07-02 NOTE — Progress Notes (Signed)
 PROGRESS NOTE    Jenny [name]  FMW:[id number] DOB: December 24, 1932 DOA: 07/01/2024 PCP: Ileen Rosaline NOVAK, NP   Brief Narrative:  Patient is an 89 year old obese Caucasian female with a past medical history significant for but not limited to chronic diastolic CHF, atrial fibrillation on anticoagulation with Eliquis , COPD, hypertension, tachybradycardia syndrome, hyperlipidemia, CAD and other comorbidities who presented to the ED with worsening shortness of breath and dyspnea.  Patient was recently admitted and discharged on 717 for CHF and COPD exacerbation.  She presented with worsening dyspnea and states that she has had some dietary indiscretion and admitted to eating [employer] since being discharged.  Thinks she got more fluid now.  Recently had a repeat echocardiogram done about 10 days ago.  Given her worsening leg swelling and shortness of breath she presented to the ED for further evaluation and is being admitted for acute respiratory failure with hypoxia in the setting of likely CHF exacerbation.  She is currently being diuresed and cardiology been consulted for further evaluation recommendations.  Assessment and Plan:  Acute respiratory failure with hypoxia in the setting of acute on chronic diastolic CHF as well as ? COPD component: Presents today with worsening dyspnea on exertion and leg swelling.  Recently had an echocardiogram which showed an EF of 60 to 65% with biatrial dilation and mild pulmonary artery pressures.  Currently she is not wheezing and denies any increase in her sputum production.  She was started on IV Lasix  20 mg and increased increase diuresis to 40 mg twice daily.  Strict I's and O's and daily weights will need to monitor volume status.  She did receive Solu-Medrol  in the ED but is now on a prednisone  taper however unlikely that this is COPD. Allegedly her dry weight is 195 lbs. Will continue Xopenex and Atrovent every 6 scheduled and we will order flutter valve,  incentive spirometer and add guaifenesin 1200 mg twice daily.  Will need repeat chest x-ray in the a.m. cardiology been consulted for further evaluation recommendations currently they are recommending decreasing IV diuresis to 20 mg daily and are tentatively planning a right heart catheterization for further evaluation delineation  COPD with prior history of tobacco abuse: Currently not wheezing and has quit smoking about 3 years ago.  Continue monitor respiratory status and continue nebs as above and guaifenesin.  Do not feel strong indication for systemic IV steroids but will continue the prednisone  that was started.  Persistent atrial fibrillation with SVR and bradycardia: She is a CHA2DS2-VASc score of 7 and will continue monitor on telemetry and continue with Apixaban  2.5 mg  p.o. twice daily  CAD status post stenting with DES to the right RCA /close mildly elevated troponins: Troponin trend is flat and elevation is likely secondary to demand ischemia from pulmonary hypertension and volume overload.  Continuing to monitor for any signs and symptoms of chest pain.  Continuing rosuvastatin  20 mg p.o. daily and she is not on aspirin  due to her being on apixaban  for atrial fibrillation  Hyperlipidemia: Continue Rosuvastatin  20 mg p.o. daily  AKI on CKD stage IIIb: Baseline creatinine is around 1.4 BUN/Cr Trend: Recent Labs  Lab 06/20/24 0456 06/21/24 0520 06/22/24 0431 06/23/24 0357 06/24/24 0437 07/01/24 1304 07/02/24 0242  BUN 65* 62* 61* 56* 63* 43* 47*  CREATININE 2.46* 2.00* 1.85* 1.63* 1.74* 1.81* 2.15*  -Cautious Diuresis as above -Avoid Nephrotoxic Medications, Contrast Dyes, Hypotension and Dehydration to Ensure Adequate Renal Perfusion and will need to Renally Adjust Meds -Continue to  Monitor and Trend Renal Function carefully and repeat CMP in the AM   Diabetes mellitus type 2:  Hemoglobin A1c was 6.6. CTM CBGs per Protocol. No results for input(s): GLUCAP in the last 720  hours.  Normocytic Anemia: Hgb/Hct Trend:  Recent Labs  Lab 06/16/24 1441 06/17/24 0410 07/01/24 1304 07/02/24 0242  HGB 11.6* 11.4* 10.9* 10.2*  HCT 35.3* 35.5* 35.1* 32.2*  MCV 92.2 92.0 94.6 92.0  -Check Anemia Panel in the AM. CTM for S/Sx of Bleeding; No overt bleeding noted. Repeat CBC in the AM  Hypoalbuminemia: Patient's Albumin Lvls ranging from 2.7-3.0. CTM and Trend and repeat CMP in the AM   DVT prophylaxis: apixaban  (ELIQUIS ) tablet 2.5 mg Start: 07/01/24 2215 apixaban  (ELIQUIS ) tablet 2.5 mg    Code Status: Full Code Family Communication: D/w Son @ bedside   Disposition Plan:  Level of care: Telemetry Cardiac Status is: Inpatient Remains inpatient appropriate because: Needs further clinical improvement and clearance by cardiology as they are planning on right heart cath on Monday   Consultants:  Cardiology  Procedures:  As delineated as above  Antimicrobials:  Anti-infectives (From admission, onward)    None       Subjective: Seen and examined at bedside and she is still feeling little short of breath.  Feels little bit better compared to yesterday.  No nausea or vomiting.  Denies any lightheadedness or dizziness.  Thinks she was not adequately diuresed last hospitalization. Admits to having some dietary indiscretions and eating out a lot.  No other concerns or complaints at this time.  Objective: Vitals:   07/02/24 0310 07/02/24 0719 07/02/24 1109 07/02/24 1517  BP: (!) 146/54 128/60 (!) 133/50 (!) 138/51  Pulse: 66 (!) 55 80 (!) 55  Resp: (!) 21 20 15 16   Temp: 97.8 F (36.6 C) 98.5 F (36.9 C) 98.8 F (37.1 C) 98.2 F (36.8 C)  TempSrc: Oral Oral Oral Oral  SpO2: 97% 97% 96% 95%  Weight: 92.1 kg       Intake/Output Summary (Last 24 hours) at 07/02/2024 1722 Last data filed at 07/02/2024 1644 Gross per 24 hour  Intake 240 ml  Output 1825 ml  Net -1585 ml   Filed Weights   07/01/24 2044 07/02/24 0310  Weight: 91.8 kg 92.1 kg    Examination: Physical Exam:  Constitutional: WN/WD obese elderly Caucasian female in no acute distress Respiratory: Diminished to auscultation bilaterally with some coarse breath sounds and does have some crackles and some slight rhonchi but no appreciable wheezing or rales. Normal respiratory effort and patient is not tachypenic. No accessory muscle use.  Unlabored breathing Cardiovascular: RRR, no murmurs / rubs / gallops. S1 and S2 auscultated.  Very mild extremity edema Abdomen: Soft, non-tender, distended secondary to body habitus. Bowel sounds positive.  GU: Deferred. Musculoskeletal: No clubbing / cyanosis of digits/nails. No joint deformity upper and lower extremities. Skin: No rashes, lesions, ulcers on limited skin evaluation. No induration; Warm and dry.  Neurologic: CN 2-12 grossly intact with no focal deficits. Romberg sign and cerebellar reflexes not assessed.  Psychiatric: Normal judgment and insight. Alert and oriented x 3. Normal mood and appropriate affect.   Data Reviewed: I have personally reviewed following labs and imaging studies  CBC: Recent Labs  Lab 07/01/24 1304 07/02/24 0242  WBC 11.2* 9.2  NEUTROABS 9.5*  --   HGB 10.9* 10.2*  HCT 35.1* 32.2*  MCV 94.6 92.0  PLT 252 248   Basic Metabolic Panel: Recent Labs  Lab 07/01/24  1304 07/02/24 0242  NA 139 135  K 3.3* 3.8  CL 98 95*  CO2 27 25  GLUCOSE 159* 355*  BUN 43* 47*  CREATININE 1.81* 2.15*  CALCIUM  6.6* 6.7*   GFR: Estimated Creatinine Clearance: 19.1 mL/min (A) (by C-G formula based on SCr of 2.15 mg/dL (H)). Liver Function Tests: Recent Labs  Lab 07/01/24 1304 07/02/24 0242  AST 17 21  ALT 21 20  ALKPHOS 86 90  BILITOT 1.2 1.0  PROT 6.2* 6.0*  ALBUMIN 2.9* 2.7*   No results for input(s): LIPASE, AMYLASE in the last 168 hours. No results for input(s): AMMONIA in the last 168 hours. Coagulation Profile: No results for input(s): INR, PROTIME in the last 168  hours. Cardiac Enzymes: No results for input(s): CKTOTAL, CKMB, CKMBINDEX, TROPONINI in the last 168 hours. BNP (last 3 results) No results for input(s): PROBNP in the last 8760 hours. HbA1C: No results for input(s): HGBA1C in the last 72 hours. CBG: No results for input(s): GLUCAP in the last 168 hours. Lipid Profile: No results for input(s): CHOL, HDL, LDLCALC, TRIG, CHOLHDL, LDLDIRECT in the last 72 hours. Thyroid  Function Tests: No results for input(s): TSH, T4TOTAL, FREET4, T3FREE, THYROIDAB in the last 72 hours. Anemia Panel: No results for input(s): VITAMINB12, FOLATE, FERRITIN, TIBC, IRON , RETICCTPCT in the last 72 hours. Sepsis Labs: Recent Labs  Lab 07/01/24 1325 07/01/24 1522  LATICACIDVEN 3.1* 1.7   Recent Results (from the past 240 hours)  Resp panel by RT-PCR (RSV, Flu A&B, Covid) Anterior Nasal Swab     Status: None   Collection Time: 07/01/24  1:04 PM   Specimen: Anterior Nasal Swab  Result Value Ref Range Status   SARS Coronavirus 2 by RT PCR NEGATIVE NEGATIVE Final   Influenza A by PCR NEGATIVE NEGATIVE Final   Influenza B by PCR NEGATIVE NEGATIVE Final    Comment: (NOTE) The Xpert Xpress SARS-CoV-2/FLU/RSV plus assay is intended as an aid in the diagnosis of influenza from Nasopharyngeal swab specimens and should not be used as a sole basis for treatment. Nasal washings and aspirates are unacceptable for Xpert Xpress SARS-CoV-2/FLU/RSV testing.  Fact Sheet for Patients: BloggerCourse.com  Fact Sheet for Healthcare Providers: SeriousBroker.it  This test is not yet approved or cleared by the [place]  FDA and has been authorized for detection and/or diagnosis of SARS-CoV-2 by FDA under an Emergency Use Authorization (EUA). This EUA will remain in effect (meaning this test can be used) for the duration of the COVID-19 declaration under Section 564(b)(1)  of the Act, 21 U.S.C. section 360bbb-3(b)(1), unless the authorization is terminated or revoked.     Resp Syncytial Virus by PCR NEGATIVE NEGATIVE Final    Comment: (NOTE) Fact Sheet for Patients: BloggerCourse.com  Fact Sheet for Healthcare Providers: SeriousBroker.it  This test is not yet approved or cleared by the [place]  FDA and has been authorized for detection and/or diagnosis of SARS-CoV-2 by FDA under an Emergency Use Authorization (EUA). This EUA will remain in effect (meaning this test can be used) for the duration of the COVID-19 declaration under Section 564(b)(1) of the Act, 21 U.S.C. section 360bbb-3(b)(1), unless the authorization is terminated or revoked.  Performed at [facility], [address] [place], [place] [address]     Radiology Studies: DG Chest 2 View Result Date: 07/01/2024 CLINICAL DATA:  Shortness of breath. Weight gain. Concern for fluid retention. EXAM: CHEST - 2 VIEW COMPARISON:  06/16/2024 FINDINGS: Chronic cardiomegaly. Unchanged mediastinal contours. Aortic atherosclerosis. Chronic  hyperinflation. Chronic peribronchial thickening. No convincing pleural effusions. No confluent airspace disease. No pneumothorax. IMPRESSION: 1. Chronic cardiomegaly.  Aortic Atherosclerosis (ICD10-I70.0). 2. Chronic hyperinflation and bronchial thickening. 3. No pleural effusions or convincing pulmonary edema. Electronically Signed   By: Andrea Gasman M.D.   On: 07/01/2024 14:20   Scheduled Meds:  apixaban   2.5 mg Oral BID   dorzolamide   1 drop Both Eyes BID   furosemide   40 mg Intravenous Q12H   gabapentin   100 mg Oral q AM   gabapentin   200 mg Oral QHS   guaiFENesin  1,200 mg Oral BID   insulin  aspart  0-9 Units Subcutaneous TID WC   ipratropium  0.5 mg Nebulization Q6H   latanoprost   1 drop Both Eyes q AM   levalbuterol  0.63 mg Nebulization Q6H   pantoprazole   40 mg Oral Daily   potassium  chloride  40 mEq Oral Once   predniSONE   40 mg Oral Q breakfast   rosuvastatin   20 mg Oral Daily   sodium chloride  flush  3 mL Intravenous Q12H   Continuous Infusions:   LOS: 1 day   Alejandro Marker, DO [facility] Available via Epic secure chat 7am-7pm After these hours, please refer to coverage provider listed on [email or web address] 07/02/2024, 5:22 PM

## 2024-07-02 NOTE — Progress Notes (Signed)
 Heart Failure Nurse Navigator Progress Note  PCP: Schmerge, Rosaline NOVAK, NP PCP-Cardiologist: [facility] Heart Failure Admission Diagnosis: Shortness of breath , Rales Admitted from: Home via EMS  Presentation:   Jenny [name] presented with shortness of breath, lew edema. went to see her MD and was instructed to go to the ED. Placed on O2 via [place]. BP 162/65, HR 53, BNP 257.5, Lactic Acid 3.1, Troponin 40. CXR shows chronic hyperinflation and bronchial thickening without convincing pleural effusions or pulmonary edema.  No large pneumonia or pneumothorax. Patient was just discharged from hospital on 06/24/24 for CHF and COPD. Cards consulted.   Patient was given a Living Better with Heart Failure Book and educated on the sign and symptoms of heart failure, daily weights, when to call her MD or go to the ED. Diet/ fluid restrictions, taking all medications as prescribed and attending all medical appointments. Patient verbalized her understanding of all education. A [facility] HF appointment was scheduled for 07/19/2024 @ 12 noon.       ECHO/ LVEF: 60-65%  Clinical Course:  Past Medical History:  Diagnosis Date   ACUT DUOD ULCER W/HEMORR&PERF W/O MENTION OBST 10/05/2009   NSAID induced   ALLERGIC RHINITIS CAUSE UNSPECIFIED    ANEMIA-NOS    CAD (coronary artery disease) 06/08/2009   DEs RCA with 70% LAD and EF 60%   CHF (congestive heart failure) (HCC)    COPD    mild obst on PFTs 03/2010   Diabetes mellitus 06/2010 dx   Mild, diet controlled   GLAUCOMA    HYPERLIPIDEMIA    HYPERTENSION, BENIGN    MYOCARDIAL INFARCTION 06/08/2009   des to rca   Persistent atrial fibrillation (HCC)    Dx 08/2021   TOBACCO ABUSE      Social History   Socioeconomic History   Marital status: Widowed    Spouse name: Not on file   Number of children: Not on file   Years of education: Not on file   Highest education level: Not on file  Occupational History   Not on file  Tobacco Use   Smoking status: Former     Current packs/day: 0.00    Average packs/day: 0.5 packs/day for 60.0 years (30.0 ttl pk-yrs)    Types: Cigarettes    Start date: 11/12/1961    Quit date: 11/12/2021    Years since quitting: 2.6   Smokeless tobacco: Never   Tobacco comments:    She lives in [place] with her significant other (Charles Hook)0  Vaping Use   Vaping status: Never Used  Substance and Sexual Activity   Alcohol use: Yes    Alcohol/week: 1.0 - 2.0 standard drink of alcohol    Types: 1 - 2 Glasses of wine per week    Comment: once or twice a year glass of wine 12/25/21   Drug use: No   Sexual activity: Not on file  Other Topics Concern   Not on file  Social History Narrative   Lives in [place] with her SO - charles hook   Retired -Acupuncturist   Enjoys travel - live Engineer, maintenance (IT)   Social Drivers of Corporate investment banker Strain: Not on file  Food Insecurity: No Food Insecurity (07/01/2024)   Hunger Vital Sign    Worried About Running Out of Food in the Last Year: Never true    Ran Out of Food in the Last Year: Never true  Transportation Needs: No Transportation Needs (07/01/2024)   PRAPARE - Transportation  Lack of Transportation (Medical): No    Lack of Transportation (Non-Medical): No  Physical Activity: Not on file  Stress: Not on file  Social Connections: Moderately Isolated (07/01/2024)   Social Connection and Isolation Panel    Frequency of Communication with Friends and Family: More than three times a week    Frequency of Social Gatherings with Friends and Family: Three times a week    Attends Religious Services: 1 to 4 times per year    Active Member of Clubs or Organizations: No    Attends Banker Meetings: Never    Marital Status: Widowed   Education Assessment and Provision:  Detailed education and instructions provided on heart failure disease management including the following:  Signs and symptoms of Heart Failure When to call the  physician Importance of daily weights Low sodium diet Fluid restriction Medication management Anticipated future follow-up appointments  Patient education given on each of the above topics.  Patient acknowledges understanding via teach back method and acceptance of all instructions.  Education Materials:  Living Better With Heart Failure Booklet, HF zone tool, & Daily Weight Tracker Tool.  Patient has scale at home: Yes Patient has pill box at home: Yes    High Risk Criteria for Readmission and/or Poor Patient Outcomes: Heart failure hospital admissions (last 6 months): 0  No Show rate: 2 %  Difficult social situation: No Demonstrates medication adherence: Yes Primary Language: English  Literacy level: Reading, Writing, and comprehension  Barriers of Care:   Diet/ fluid restrictions ( canned veggies)  Daily weights.   Considerations/Referrals:   Referral made to Heart Failure Pharmacist Stewardship: Yes  Referral made to Heart Failure CSW/NCM TOC: No Referral made to Heart & Vascular TOC clinic: Yes, see's Ellouise, NP at [facility]. Appointment scheduled for 07/19/2024 @ 12 noon.   Items for Follow-up on DC/TOC: Continued HF education Diet/ fluid restrictions/ daily weights   Stephane Haddock, BSN, RN Heart Failure Print production planner Chat Only

## 2024-07-02 NOTE — Consult Note (Addendum)
 Cardiology Consultation   Patient ID: ARES TEGTMEYER MRN: [id number]; DOB: 05-11-1933  Admit date: 07/01/2024 Date of Consult: 07/02/2024  PCP:  Ileen Rosaline NOVAK, NP   [facility] Providers Cardiologist:  Lonni Cash, MD      Patient Profile: Jenny [name] is a 89 y.o. female with a hx of type 2 diabetes, CKD, hypertension, hyperlipidemia, carotid artery disease, tobacco use, COPD, pulmonary hypertension, Persistent atrial fibrillation on Eliquis , tachybradycardia syndrome, 2:1 AV block, CAD prior MI on 06/2009 received DES to the RCA, HFpEF, and anemia secondary to GI bleed who is being seen 07/02/2024 for the evaluation of congestive heart failure at the request of [facility] DO.  History of Present Illness: Jenny [name] is an 89 year old female with prior cardiac history listed below.  Had a prior MI in 2010 and ended up receiving a drug-eluting stent to the RCA.  The cath also showed moderate stenosis in the LAD.  An echo was done and showed a normal LVEF of 60%.  A stress Myoview  was done on 12/2013 and was low risk for ischemia.  In 12/2018 Doppler ultrasound showed mild bilateral carotid artery disease.  This was not followed up on due to the patient's advanced age She was hospitalized on 08/2021 for a COPD exacerbation and HFpEF.  She was diuresed using IV Lasix .  During this hospitalization was found to be in atrial fibrillation and was started on Eliquis .  Her aspirin  and Plavix  were stopped after she was started on Eliquis .  A cardiac monitor was done on 09/2021 and showed 100% burden of atrial fibrillation.  Was readmitted on 11/22 for CHF and COPD exacerbation. She underwent a DCCV on 01/2022.  She converted to sinus rhythm but was also noted to have a 2:1 AV block.  Because of this her beta-blocker was stopped.  On follow-up the patient reported bradycardia with heart rates in the 40s, dyspnea, and fatigue.  She was referred to the EP clinic for permanent pacing.   Cardiac catheterization was recommended but the patient refused this procedure on 04/2022 and 07/2022.  Also in 2023 the patient stopped taking her entresto .  Echo on 04/2023 showed normal LVEF of 65 to 70%, severe pulmonary hypertension, mild to moderate TR, and aortic valve sclerosis.  She was hospitalized on 09/2023 at [facility].  She had melena and was Hemoccult positive but refused an EGD or colonoscopy.  Eliquis  was stopped.  At follow-up hemoglobin had improved to 12.3 and Eliquis  was resumed.    Patient was admitted to the hospital on 06/16/2024 for shortness of breath.  BNP was mildly elevated at 227 and chest x-ray showed no active pulmonary disease.  Echocardiogram showed LVEF of 60 to 65%, no regional wall motion abnormalities, moderate asymmetric LVH, moderate left and right atrial dilation, moderate MR, and pulmonary artery hypertension. Cardiology was consulted and it was felt like this shortness of breath was secondary to COPD rather than heart failure.  Patient was discharged on 06/24/2024.   Patient presented back to the emergency department on 07/01/2024 for worsening shortness of breath.  On interview patient reported that she feels like her symptoms are secondary to fluid.  She stated that she has had worsening shortness of breath since her last discharge.  She is not on home O2.  She cares for herself at home and does all of her own shopping and housework.  She is typically unable to do more than 4 metabolic equivalents of exertion.  Reported that her baseline weight  is about 190 lbs. Denies chest pain, palpitations, PND, melena, hematochezia, hematuria.  Reportedly drinks alcohol 3-4 times per year, Quit smoking tobacco about 2 years ago, denies marijuana use, denies illicit substance use.   Chest x-ray showed chronic cardiomegaly, chronic hyperinflation and bronchial thickening, and no pleural effusions or pulmonary edema.  EKG showed atrial fibrillation with a rate of 56. High-sensitivity  troponins of 40 > 41, Elevated lactic acid of 3.1,  Anemia with a hemoglobin of 10.9, elevated BNP of 257.5, hypokalemia with potassium of 3.8, creatinine 2.15, elevated BUN of 47, and elevated glucose of 355, albumin 2.9.  Past Medical History:  Diagnosis Date   ACUT DUOD ULCER W/HEMORR&PERF W/O MENTION OBST 10/05/2009   NSAID induced   ALLERGIC RHINITIS CAUSE UNSPECIFIED    ANEMIA-NOS    CAD (coronary artery disease) 06/08/2009   DEs RCA with 70% LAD and EF 60%   CHF (congestive heart failure) (HCC)    COPD    mild obst on PFTs 03/2010   Diabetes mellitus 06/2010 dx   Mild, diet controlled   GLAUCOMA    HYPERLIPIDEMIA    HYPERTENSION, BENIGN    MYOCARDIAL INFARCTION 06/08/2009   des to rca   Persistent atrial fibrillation (HCC)    Dx 08/2021   TOBACCO ABUSE     Past Surgical History:  Procedure Laterality Date   CARDIOVERSION N/A 01/30/2022   Procedure: CARDIOVERSION;  Surgeon: Alveta Aleene PARAS, MD;  Location: [facility] ENDOSCOPY;  Service: Cardiovascular;  Laterality: N/A;   HEMORRHOID SURGERY  1990   Right knee surgery       Home Medications:  Prior to Admission medications   Medication Sig Start Date End Date Taking? Authorizing Provider  acetaminophen  (TYLENOL ) 500 MG tablet Take 500 mg by mouth every 6 (six) hours as needed (for knee, hip pain).   Yes [provider]  acetaminophen -codeine  (TYLENOL  #2) 300-15 MG tablet Take 1 tablet by mouth every 8 (eight) hours as needed for moderate pain (pain score 4-6). 06/25/24 07/09/24 Yes [provider]  albuterol  (VENTOLIN  HFA) 108 (90 Base) MCG/ACT inhaler INHALE ONE PUFF EVERY 6 HOURS AS NEEDED FOR SHORTNESS OF BREATH 11/03/21  Yes Claudene Arthea SQUIBB, MD  apixaban  (ELIQUIS ) 2.5 MG TABS tablet Take 1 tablet (2.5 mg total) by mouth 2 (two) times daily. 11/10/23  Yes Weaver, Scott T, PA-C  dorzolamide  (TRUSOPT ) 2 % ophthalmic solution Place 1 drop into both eyes 2 (two) times daily.   Yes [provider]  furosemide   (LASIX ) 40 MG tablet Take 1 tablet (40 mg total) by mouth daily. 06/25/24 09/23/24 Yes Hackney, Tina A, FNP  gabapentin  (NEURONTIN ) 100 MG capsule Take 100-200 mg by mouth See admin instructions. Take 1 capsule (100mg ) by mouth every morning and take 2 capsules (200mg ) by mouth at bedtime   Yes [provider]  Glycopyrrolate -Formoterol  (BEVESPI  AEROSPHERE) 9-4.8 MCG/ACT AERO Inhale 2 puffs into the lungs 2 (two) times daily. Patient taking differently: Inhale 2 puffs into the lungs 2 (two) times daily as needed (for COPD). 06/17/24  Yes Rojelio Nest, DO  hydrOXYzine (ATARAX) 25 MG tablet Take 25 mg by mouth every 8 (eight) hours as needed for itching. 06/06/22  Yes [provider]  ipratropium-albuterol  (DUONEB) 0.5-2.5 (3) MG/3ML SOLN Take 3 mLs by nebulization every 6 (six) hours as needed. Patient taking differently: Take 3 mLs by nebulization every 6 (six) hours as needed (shortness of breath). 11/06/21  Yes Patel, Sona, MD  latanoprost  (XALATAN ) 0.005 % ophthalmic solution Place  1 drop into both eyes in the morning.   Yes [provider]  nystatin  (MYCOSTATIN /NYSTOP ) powder Apply topically 3 (three) times daily. 06/24/24  Yes Ezenduka, Nkeiruka J, MD  pantoprazole  (PROTONIX ) 40 MG tablet Take 1 tablet (40 mg total) by mouth daily. 10/14/23  Yes Weaver, Scott T, PA-C  rosuvastatin  (CRESTOR ) 20 MG tablet Take 20 mg by mouth daily.   Yes [provider]  Tiotropium Bromide-Olodaterol (STIOLTO RESPIMAT) 2.5-2.5 MCG/ACT AERS Inhale 2 puffs into the lungs 2 (two) times daily.   Yes [provider]  metolazone  (ZAROXOLYN ) 2.5 MG tablet Take 1 tablet (2.5 mg total) by mouth as needed. Patient taking differently: Take 2.5 mg by mouth as needed (edema). 02/19/24 06/16/24  Donette Ellouise LABOR, FNP  NITROSTAT  0.4 MG SL tablet DISSOLVE ONE TABLET UNDER TONGUE AS NEEDED FOR CHEST PAIN - MAY REPEAT TWICE-IF NO RELIEF GO TO NEAREST HOSPITAL ER Patient taking differently: Place  0.4 mg under the tongue every 5 (five) minutes as needed for chest pain. 05/06/13   Inocencio Berwyn LABOR, MD    Scheduled Meds:  apixaban   2.5 mg Oral BID   dorzolamide   1 drop Both Eyes BID   furosemide   40 mg Intravenous Q12H   gabapentin   100 mg Oral q AM   gabapentin   200 mg Oral QHS   guaiFENesin  1,200 mg Oral BID   insulin  aspart  0-9 Units Subcutaneous TID WC   ipratropium  0.5 mg Nebulization Q6H   latanoprost   1 drop Both Eyes q AM   levalbuterol  0.63 mg Nebulization Q6H   pantoprazole   40 mg Oral Daily   predniSONE   40 mg Oral Q breakfast   rosuvastatin   20 mg Oral Daily   sodium chloride  flush  3 mL Intravenous Q12H   Continuous Infusions:  PRN Meds: acetaminophen  **OR** acetaminophen , hydrALAZINE , nitroGLYCERIN   Allergies:    Allergies  Allergen Reactions   Aspirin  Other (See Comments)    Can take 81 mg not 325 mg -bleeding   Entresto  [Sacubitril -Valsartan ] Shortness Of Breath    COPD and reports increased SOB with Entresto    Atorvastatin  Itching    Itching and myaliga  Other Reaction(s): itching, rash, Other (See Comments)   Cyclobenzaprine     Other reaction(s): muscle relaxers-ulcer hemorrhage (hospitalized)  Other Reaction(s): Other (See Comments), ulcer hemorrhage   Lactose Intolerance (Gi) Other (See Comments)    GI upset   Meperidine Hcl Other (See Comments)    Not known   Pravastatin  Rash    rash  Other Reaction(s): Other (See Comments)   Propoxyphene     Other reaction(s): pain meds, hallucination, vomiting  Other Reaction(s): Hallucination, hallucinations, vomiting, Other (See Comments), Unknown   Wound Dressing Adhesive     Other reaction(s): red, stings    Social History:   Social History   Socioeconomic History   Marital status: Widowed    Spouse name: Not on file   Number of children: Not on file   Years of education: Not on file   Highest education level: Not on file  Occupational History   Not on file  Tobacco Use    Smoking status: Former    Current packs/day: 0.00    Average packs/day: 0.5 packs/day for 60.0 years (30.0 ttl pk-yrs)    Types: Cigarettes    Start date: 11/12/1961    Quit date: 11/12/2021    Years since quitting: 2.6   Smokeless tobacco: Never   Tobacco comments:    She lives  in [place] with her significant other (Charles Hook)0  Vaping Use   Vaping status: Never Used  Substance and Sexual Activity   Alcohol use: Yes    Alcohol/week: 1.0 - 2.0 standard drink of alcohol    Types: 1 - 2 Glasses of wine per week    Comment: once or twice a year glass of wine 12/25/21   Drug use: No   Sexual activity: Not on file  Other Topics Concern   Not on file  Social History Narrative   Lives in [place] with her SO - charles hook   Retired -Acupuncturist   Enjoys travel - live Engineer, maintenance (IT)   Social Drivers of Corporate investment banker Strain: Low Risk  (07/02/2024)   Overall Financial Resource Strain (CARDIA)    Difficulty of Paying Living Expenses: Not very hard  Food Insecurity: No Food Insecurity (07/01/2024)   Hunger Vital Sign    Worried About Running Out of Food in the Last Year: Never true    Ran Out of Food in the Last Year: Never true  Transportation Needs: No Transportation Needs (07/02/2024)   PRAPARE - Administrator, Civil Service (Medical): No    Lack of Transportation (Non-Medical): No  Physical Activity: Not on file  Stress: Not on file  Social Connections: Moderately Isolated (07/01/2024)   Social Connection and Isolation Panel    Frequency of Communication with Friends and Family: More than three times a week    Frequency of Social Gatherings with Friends and Family: Three times a week    Attends Religious Services: 1 to 4 times per year    Active Member of Clubs or Organizations: No    Attends Banker Meetings: Never    Marital Status: Widowed  Intimate Partner Violence: Not At Risk (07/01/2024)   Humiliation, Afraid, Rape, and  Kick questionnaire    Fear of Current or Ex-Partner: No    Emotionally Abused: No    Physically Abused: No    Sexually Abused: No    Family History:    Family History  Problem Relation Age of Onset   Hypertension Mother    Ulcers Mother    Stomach cancer Father    Stroke Maternal Grandmother    Heart attack Neg Hx      ROS:  Please see the history of present illness.   All other ROS reviewed and negative.     Physical Exam/Data: Vitals:   07/02/24 0055 07/02/24 0310 07/02/24 0719 07/02/24 1109  BP: (!) 155/74 (!) 146/54 128/60 (!) 133/50  Pulse: 65 66 (!) 55 80  Resp: 17 (!) 21 20 15   Temp: 97.9 F (36.6 C) 97.8 F (36.6 C) 98.5 F (36.9 C) 98.8 F (37.1 C)  TempSrc: Oral Oral Oral Oral  SpO2: 95% 97% 97% 96%  Weight:  92.1 kg      Intake/Output Summary (Last 24 hours) at 07/02/2024 1146 Last data filed at 07/02/2024 0313 Gross per 24 hour  Intake --  Output 1000 ml  Net -1000 ml      07/02/2024    3:10 AM 07/01/2024    8:44 PM 06/17/2024    5:55 AM  Last 3 Weights  Weight (lbs) 203 lb 0.7 oz 202 lb 6.4 oz 198 lb 6.4 oz  Weight (kg) 92.1 kg 91.808 kg 89.994 kg     Body mass index is 37.14 kg/m.  General:  Well nourished, well developed, in no acute distress, on room air, alert  and orientated, and pursed lip breathing. HEENT: normal Neck:  JVD assessment was limited by body habitus.  Vascular: Mild bilateral carotid bruits; Distal pulses 2+ bilaterally Cardiac:  normal S1, S2; irregularly irregular rhythm, Lungs: Diffuse wheezing heard throughout the lungs Abd: soft, nontender, no hepatomegaly  Ext: no edema. Musculoskeletal:  No deformities. Skin: warm and dry. Neuro:  no focal abnormalities noted Psych:  Normal affect   EKG:  The EKG was personally reviewed and demonstrates:  EKG showed atrial fibrillation with a rate of 56. Telemetry:  Telemetry was personally reviewed and demonstrates: Atrial fibrillation with heart rates in the 50s  Relevant CV  Studies:  Prior echocardiogram on 06/18/2024 IMPRESSIONS     1. Left ventricular ejection fraction, by estimation, is 60 to 65%. The  left ventricle has normal function. The left ventricle has no regional  wall motion abnormalities. There is moderate asymmetric left ventricular  hypertrophy of the basal-septal  segment. Left ventricular diastolic parameters are indeterminate.   2. Right ventricular systolic function is normal. The right ventricular  size is mildly enlarged. There is mildly elevated pulmonary artery  systolic pressure. The estimated right ventricular systolic pressure is  43.7 mmHg.   3. Left atrial size was moderately dilated.   4. Right atrial size was moderately dilated.   5. The mitral valve is degenerative. Trivial mitral valve regurgitation.  Moderate mitral stenosis. Severe mitral annular calcification. MG ,  MVA 1.4cm^2 by continuity equation.   6. The aortic valve is tricuspid. Aortic valve regurgitation is not  visualized. Aortic valve sclerosis/calcification is present, without any  evidence of aortic stenosis.   FINDINGS   Left Ventricle: Left ventricular ejection fraction, by estimation, is 60  to 65%. The left ventricle has normal function. The left ventricle has no  regional wall motion abnormalities. The left ventricular internal cavity  size was normal in size. There is   moderate asymmetric left ventricular hypertrophy of the basal-septal  segment. Left ventricular diastolic parameters are indeterminate.   Right Ventricle: The right ventricular size is mildly enlarged. Right  vetricular wall thickness was not well visualized. Right ventricular  systolic function is normal. There is mildly elevated pulmonary artery  systolic pressure. The tricuspid regurgitant   velocity is 3.19 m/s, and with an assumed right atrial pressure of 3  mmHg, the estimated right ventricular systolic pressure is 43.7 mmHg.   Left Atrium: Left atrial size was  moderately dilated.   Right Atrium: Right atrial size was moderately dilated.   Pericardium: There is no evidence of pericardial effusion.   Mitral Valve: The mitral valve is degenerative in appearance. Severe  mitral annular calcification. Trivial mitral valve regurgitation. Moderate  mitral valve stenosis.   Tricuspid Valve: The tricuspid valve is normal in structure. Tricuspid  valve regurgitation is trivial.   Aortic Valve: The aortic valve is tricuspid. Aortic valve regurgitation is  not visualized. Aortic valve sclerosis/calcification is present, without  any evidence of aortic stenosis. Aortic valve mean gradient measures 5.0  mmHg. Aortic valve peak gradient  measures 10.6 mmHg. Aortic valve area, by VTI measures 2.56 cm.   Pulmonic Valve: The pulmonic valve was not well visualized. Pulmonic valve  regurgitation is not visualized.   Aorta: The aortic root and ascending aorta are structurally normal, with  no evidence of dilitation.   IAS/Shunts: The interatrial septum was not well visualized.   Laboratory Data: High Sensitivity Troponin:   Recent Labs  Lab 06/16/24 1441 06/16/24 1958 07/01/24 1304 07/01/24 1517  TROPONINIHS 28* 27* 40* 41*     Chemistry Recent Labs  Lab 07/01/24 1304 07/02/24 0242  NA 139 135  K 3.3* 3.8  CL 98 95*  CO2 27 25  GLUCOSE 159* 355*  BUN 43* 47*  CREATININE 1.81* 2.15*  CALCIUM  6.6* 6.7*  GFRNONAA 27* 22*  ANIONGAP 14 15    Recent Labs  Lab 07/01/24 1304 07/02/24 0242  PROT 6.2* 6.0*  ALBUMIN 2.9* 2.7*  AST 17 21  ALT 21 20  ALKPHOS 86 90  BILITOT 1.2 1.0   Lipids No results for input(s): CHOL, TRIG, HDL, LABVLDL, LDLCALC, CHOLHDL in the last 168 hours.  Hematology Recent Labs  Lab 07/01/24 1304 07/02/24 0242  WBC 11.2* 9.2  RBC 3.71* 3.50*  HGB 10.9* 10.2*  HCT 35.1* 32.2*  MCV 94.6 92.0  MCH 29.4 29.1  MCHC 31.1 31.7  RDW 15.9* 15.9*  PLT 252 248   Thyroid  No results for input(s):  TSH, FREET4 in the last 168 hours.  BNP Recent Labs  Lab 07/01/24 1304  BNP 257.5*    DDimer No results for input(s): DDIMER in the last 168 hours.  Radiology/Studies:  DG Chest 2 View Result Date: 07/01/2024 CLINICAL DATA:  Shortness of breath. Weight gain. Concern for fluid retention. EXAM: CHEST - 2 VIEW COMPARISON:  06/16/2024 FINDINGS: Chronic cardiomegaly. Unchanged mediastinal contours. Aortic atherosclerosis. Chronic hyperinflation. Chronic peribronchial thickening. No convincing pleural effusions. No confluent airspace disease. No pneumothorax. IMPRESSION: 1. Chronic cardiomegaly.  Aortic Atherosclerosis (ICD10-I70.0). 2. Chronic hyperinflation and bronchial thickening. 3. No pleural effusions or convincing pulmonary edema. Electronically Signed   By: Andrea Gasman M.D.   On: 07/01/2024 14:20     Assessment and Plan: Jenny [name] is a 89 y.o. female with a hx of type 2 diabetes, CKD, hypertension, hyperlipidemia, carotid artery disease, tobacco use, COPD, pulmonary hypertension, atrial fibrillation on Eliquis , tachybradycardia syndrome, 2:1 AV block, CAD prior MI on 06/2009 received DES to the RCA, HFpEF, and anemia secondary to GI bleed who is being seen 07/02/2024 for the evaluation of congestive heart failure at the request of [facility] DO.  Chronic HFpEF Pulmonary hypertension Patient was admitted to the hospital on 06/16/2024 for shortness of breath.  BNP was mildly elevated at 227 and chest x-ray showed no active pulmonary disease. Echocardiogram showed LVEF of 60 to 65%, no regional wall motion abnormalities, moderate asymmetric LVH, moderate left and right atrial dilation, moderate MR, and pulmonary artery hypertension.  Cardiology was consulted and it was felt like this shortness of breath was secondary to COPD rather than heart failure. Pulmonary hypertension is likely secondary to patient's COPD. Prior to admission was on Lasix  40 mg daily.  Presented to the  emergency department on 07/01/2024 for worsening shortness of breath.  She stated that she felt like she was not diuresed correctly at the past hospitalization.  Labs showed BNP of 257.5, Creatinine 2.15 up from 1.81.  Suspect this is secondary to the Lasix , Chest x-ray showed chronic cardiomegaly, chronic hyperinflation and bronchial thickening, and no pleural effusions or pulmonary edema. Appeared euvolemic on exam.  Patient is agreeable will tentatively plan on right heart cath on Monday. Recommend decreasing IV Lasix  to 20mg  daily.  GDMT Is a poor candidate for MRA with underlying CKD. Poor candidate for SGLT2 with recent yeast infections in groin.   COPD Prior tobacco abuse Patient reported quitting tobacco 2 years ago.  Has had PFTs consistent with COPD.  Respiratory panel negative.  Suspect is the  cause of the pulmonary hypertension.  Management per primary   Persistent atrial fibrillation Bradycardia CHA2DS2-VASc Score = 7 [CHF History: 1, HTN History: 1, Diabetes History: 1, Stroke History: 0, Vascular Disease History: 1, Age Score: 2, Gender Score: 1].  Therefore, the patient's annual risk of stroke is 11.2 %.  Potassium 3.8, will order potassium replacement, order magnesium , order TSH.  Is in atrial fibrillation on telemetry. Continue Eliquis  2.5 mg twice daily.   CAD MI on 06/2009 received DES to the RCA Elevated high-sensitivity troponins 40 > 41 Hyperlipidemia Flat and elevated HSTN are likely secondary to demand ischemia from pulmonary hypertension and COPD.  Denies chest pain.  No need for aspirin  as is on Eliquis  for atrial fibrillation Continue Crestor  20 mg daily  Otherwise manage per primary    Risk Assessment/Risk Scores:       [place]  Heart Association (NYHA) Functional Class NYHA Class II       For questions or updates, please contact [facility] Please consult [email or web address] for contact info under    Signed, Morse Clause, PA-C  07/02/2024  11:46 AM

## 2024-07-02 NOTE — TOC CM/SW Note (Signed)
 Transition of Care [facility]) - Inpatient Brief Assessment   Patient Details  Name: Jenny [name] MRN: [id number] Date of Birth: October 09, 1933  [facility] CM/SW Contact:    [name], LCSWA Phone Number: 07/02/2024, 9:52 AM   Clinical Narrative: Patient recently discharged on 7/17. Per chart review, patient is from home and has support of son. Patient reports that she has DME RW and WC in the home. Patient is currently active with [facility] RN/PT/OT. Patient will need orders to resume care with [facility].   TOC will continue to follow.     Transition of Care Asessment: Insurance and Status: Insurance coverage has been reviewed Patient has primary care physician: Yes Home environment has been reviewed: From home alone Prior level of function:: Independent Prior/Current Home Services: Current home services Social Drivers of Health Review: SDOH reviewed no interventions necessary Readmission risk has been reviewed: Yes Transition of care needs: no transition of care needs at this time

## 2024-07-02 NOTE — Plan of Care (Signed)
  Problem: Education: Goal: Knowledge of General Education information will improve Description: Including pain rating scale, medication(s)/side effects and non-pharmacologic comfort measures Outcome: Progressing   Problem: Clinical Measurements: Goal: Will remain free from infection Outcome: Progressing Goal: Respiratory complications will improve Outcome: Progressing   Problem: Activity: Goal: Risk for activity intolerance will decrease Outcome: Progressing   Problem: Elimination: Goal: Will not experience complications related to urinary retention Outcome: Progressing   Problem: Pain Managment: Goal: General experience of comfort will improve and/or be controlled Outcome: Progressing

## 2024-07-03 ENCOUNTER — Inpatient Hospital Stay (HOSPITAL_COMMUNITY)

## 2024-07-03 DIAGNOSIS — N179 Acute kidney failure, unspecified: Secondary | ICD-10-CM | POA: Diagnosis not present

## 2024-07-03 DIAGNOSIS — I272 Pulmonary hypertension, unspecified: Secondary | ICD-10-CM | POA: Diagnosis not present

## 2024-07-03 DIAGNOSIS — I5031 Acute diastolic (congestive) heart failure: Secondary | ICD-10-CM | POA: Diagnosis not present

## 2024-07-03 DIAGNOSIS — N184 Chronic kidney disease, stage 4 (severe): Secondary | ICD-10-CM

## 2024-07-03 DIAGNOSIS — I5033 Acute on chronic diastolic (congestive) heart failure: Secondary | ICD-10-CM | POA: Diagnosis not present

## 2024-07-03 LAB — COMPREHENSIVE METABOLIC PANEL WITH GFR
ALT: 18 U/L (ref 0–44)
AST: 15 U/L (ref 15–41)
Albumin: 2.8 g/dL — ABNORMAL LOW (ref 3.5–5.0)
Alkaline Phosphatase: 84 U/L (ref 38–126)
Anion gap: 12 (ref 5–15)
BUN: 50 mg/dL — ABNORMAL HIGH (ref 8–23)
CO2: 25 mmol/L (ref 22–32)
Calcium: 6.9 mg/dL — ABNORMAL LOW (ref 8.9–10.3)
Chloride: 98 mmol/L (ref 98–111)
Creatinine, Ser: 2.02 mg/dL — ABNORMAL HIGH (ref 0.44–1.00)
GFR, Estimated: 23 mL/min — ABNORMAL LOW (ref >60)
Glucose, Bld: 137 mg/dL — ABNORMAL HIGH (ref 70–99)
Potassium: 3.6 mmol/L (ref 3.5–5.1)
Sodium: 135 mmol/L (ref 135–145)
Total Bilirubin: 0.8 mg/dL (ref 0.0–1.2)
Total Protein: 6 g/dL — ABNORMAL LOW (ref 6.5–8.1)

## 2024-07-03 LAB — CBC WITH DIFFERENTIAL/PLATELET
Abs Immature Granulocytes: 0.14 K/uL — ABNORMAL HIGH (ref 0.00–0.07)
Basophils Absolute: 0 K/uL (ref 0.0–0.1)
Basophils Relative: 0 %
Eosinophils Absolute: 0 K/uL (ref 0.0–0.5)
Eosinophils Relative: 0 %
HCT: 30.6 % — ABNORMAL LOW (ref 36.0–46.0)
Hemoglobin: 9.9 g/dL — ABNORMAL LOW (ref 12.0–15.0)
Immature Granulocytes: 1 %
Lymphocytes Relative: 2 %
Lymphs Abs: 0.4 K/uL — ABNORMAL LOW (ref 0.7–4.0)
MCH: 29.1 pg (ref 26.0–34.0)
MCHC: 32.4 g/dL (ref 30.0–36.0)
MCV: 90 fL (ref 80.0–100.0)
Monocytes Absolute: 0.9 K/uL (ref 0.1–1.0)
Monocytes Relative: 5 %
Neutro Abs: 16.3 K/uL — ABNORMAL HIGH (ref 1.7–7.7)
Neutrophils Relative %: 92 %
Platelets: 242 K/uL (ref 150–400)
RBC: 3.4 MIL/uL — ABNORMAL LOW (ref 3.87–5.11)
RDW: 15.9 % — ABNORMAL HIGH (ref 11.5–15.5)
WBC: 17.7 K/uL — ABNORMAL HIGH (ref 4.0–10.5)
nRBC: 0 % (ref 0.0–0.2)

## 2024-07-03 LAB — MAGNESIUM: Magnesium: 2 mg/dL (ref 1.7–2.4)

## 2024-07-03 LAB — APTT: aPTT: 49 s — ABNORMAL HIGH (ref 24–36)

## 2024-07-03 LAB — GLUCOSE, CAPILLARY
Glucose-Capillary: 269 mg/dL — ABNORMAL HIGH (ref 70–99)
Glucose-Capillary: 307 mg/dL — ABNORMAL HIGH (ref 70–99)
Glucose-Capillary: 201 mg/dL — ABNORMAL HIGH (ref 70–99)

## 2024-07-03 LAB — TSH: TSH: 0.369 u[IU]/mL (ref 0.350–4.500)

## 2024-07-03 LAB — PHOSPHORUS: Phosphorus: 4 mg/dL (ref 2.5–4.6)

## 2024-07-03 MED ORDER — HEPARIN (PORCINE) 25000 UT/250ML-% IV SOLN
1250.0000 [IU]/h | INTRAVENOUS | Status: DC
  Administered 2024-07-03: 1000 [IU]/h via INTRAVENOUS
  Administered 2024-07-04: 1100 [IU]/h via INTRAVENOUS
  Filled 2024-07-03: qty 250

## 2024-07-03 MED ORDER — FUROSEMIDE 10 MG/ML IJ SOLN
40.0000 mg | Freq: Two times a day (BID) | INTRAMUSCULAR | Status: AC
  Administered 2024-07-03 (×2): 40 mg via INTRAVENOUS
  Filled 2024-07-03 (×2): qty 4

## 2024-07-03 MED ORDER — POTASSIUM CHLORIDE CRYS ER 20 MEQ PO TBCR
20.0000 meq | EXTENDED_RELEASE_TABLET | Freq: Once | ORAL | Status: AC
  Administered 2024-07-03: 20 meq via ORAL
  Filled 2024-07-03: qty 1

## 2024-07-03 NOTE — Progress Notes (Addendum)
 The patient  had 9 beats of unsustained VTs at 0315. She was asymptomatic on re-assessment.  Notified DR. Opyd with a new order. For Potassium oral. Will administer  continue to monitor.

## 2024-07-03 NOTE — Progress Notes (Addendum)
 PHARMACY - ANTICOAGULATION CONSULT NOTE  Pharmacy Consult for IV Heparin  Indication: atrial fibrillation  Allergies  Allergen Reactions   Aspirin  Other (See Comments)    Can take 81 mg not 325 mg -bleeding   Entresto  [Sacubitril -Valsartan ] Shortness Of Breath    COPD and reports increased SOB with Entresto    Atorvastatin  Itching    Itching and myaliga  Other Reaction(s): itching, rash, Other (See Comments)   Cyclobenzaprine     Other reaction(s): muscle relaxers-ulcer hemorrhage (hospitalized)  Other Reaction(s): Other (See Comments), ulcer hemorrhage   Lactose Intolerance (Gi) Other (See Comments)    GI upset   Meperidine Hcl Other (See Comments)    Not known   Pravastatin  Rash    rash  Other Reaction(s): Other (See Comments)   Propoxyphene     Other reaction(s): pain meds, hallucination, vomiting  Other Reaction(s): Hallucination, hallucinations, vomiting, Other (See Comments), Unknown   Wound Dressing Adhesive     Other reaction(s): red, stings    Patient Measurements: Height: 5' 2 (157.5 cm) Weight: 91.4 kg (201 lb 8 oz) IBW/kg (Calculated) : 50.1 HEPARIN  DW (KG): 71.3    Vital Signs: Temp: 98.2 F (36.8 C) (07/26 0800) Temp Source: Oral (07/26 0800) BP: 140/73 (07/26 0800) Pulse Rate: 50 (07/26 0800)  Labs: Recent Labs    07/01/24 1304 07/01/24 1517 07/02/24 0242 07/03/24 0334  HGB 10.9*  --  10.2* 9.9*  HCT 35.1*  --  32.2* 30.6*  PLT 252  --  248 242  CREATININE 1.81*  --  2.15* 2.02*  TROPONINIHS 40* 41*  --   --     Estimated Creatinine Clearance: 20.2 mL/min (A) (by C-G formula based on SCr of 2.02 mg/dL (H)).   Medical History: Past Medical History:  Diagnosis Date   ACUT DUOD ULCER W/HEMORR&PERF W/O MENTION OBST 10/05/2009   NSAID induced   ALLERGIC RHINITIS CAUSE UNSPECIFIED    ANEMIA-NOS    CAD (coronary artery disease) 06/08/2009   DEs RCA with 70% LAD and EF 60%   CHF (congestive heart failure) (HCC)    COPD    mild obst  on PFTs 03/2010   Diabetes mellitus 06/2010 dx   Mild, diet controlled   GLAUCOMA    HYPERLIPIDEMIA    HYPERTENSION, BENIGN    MYOCARDIAL INFARCTION 06/08/2009   des to rca   Persistent atrial fibrillation (HCC)    Dx 08/2021   TOBACCO ABUSE     Medications:  Scheduled:   dorzolamide   1 drop Both Eyes BID   furosemide   40 mg Intravenous BID   gabapentin   100 mg Oral q AM   gabapentin   200 mg Oral QHS   guaiFENesin   1,200 mg Oral BID   insulin  aspart  0-9 Units Subcutaneous TID WC   ipratropium  0.5 mg Nebulization Q6H   latanoprost   1 drop Both Eyes q AM   levalbuterol   0.63 mg Nebulization Q6H   pantoprazole   40 mg Oral Daily   predniSONE   40 mg Oral Q breakfast   rosuvastatin   20 mg Oral Daily   sodium chloride  flush  3 mL Intravenous Q12H   Infusions:  PRN: acetaminophen  **OR** acetaminophen , hydrALAZINE , nitroGLYCERIN   Assessment: Patient is an 89YO F with PMH of diabetes, CKD stage IV, tobacco abuse, persistent atrial fibrillation on Eliquis  (last dose 7/25 at 2100), tachycardia-bradycardia managed without pacemaker, CAD, HFpEF, anemia admitted on 07/01/2024 for acute hypoxic respiratory failure.    Patient has Afib and is being transitioned from Eliquis  to IV  Heparin  in preparation for a heart cath on Monday. Given her last dose of Eliquis  was given last night, would suspect that heparin  level would be elevated; therefore would monitor aPTT instead.   Goal of Therapy:  Heparin  level 0.3-0.7 units/ml aPTT 66-102 seconds Monitor platelets by anticoagulation protocol: Yes   Plan:  Start heparin  infusion at 1000 units/hr Check aPTT level in 8 hours and daily  Check anti-Xa level daily while on heparin  Continue to monitor H&H and platelets  R. Samual Satterfield, PharmD PGY-1 Acute Care Pharmacy Resident [facility] 07/03/2024 10:12 AM

## 2024-07-03 NOTE — Progress Notes (Signed)
 Cardiology Progress Note  Patient ID: SURYA FOLDEN MRN: [id number] DOB: 04/11/1933 Date of Encounter: 07/03/2024 Primary Cardiologist: Lonni Cash, MD  Subjective   Chief Complaint: SOB  HPI: Remains volume up.  Right heart cath planned by cardiology yesterday.  ROS:  All other ROS reviewed and negative. Pertinent positives noted in the HPI.     Telemetry  Overnight telemetry shows Afib 70s, which I personally reviewed.    Physical Exam   Vitals:   07/03/24 0014 07/03/24 0353 07/03/24 0517 07/03/24 0800  BP: (!) 151/71  124/62 (!) 140/73  Pulse: 66 96 (!) 41 (!) 50  Resp: 20 15 14 17   Temp: 97.9 F (36.6 C)  97.6 F (36.4 C) 98.2 F (36.8 C)  TempSrc: Oral  Oral Oral  SpO2: 100% 97% 100% 99%  Weight:   91.4 kg     Intake/Output Summary (Last 24 hours) at 07/03/2024 0946 Last data filed at 07/03/2024 9192 Gross per 24 hour  Intake 840 ml  Output 2425 ml  Net -1585 ml       07/03/2024    5:17 AM 07/02/2024    3:10 AM 07/01/2024    8:44 PM  Last 3 Weights  Weight (lbs) 201 lb 8 oz 203 lb 0.7 oz 202 lb 6.4 oz  Weight (kg) 91.4 kg 92.1 kg 91.808 kg    Body mass index is 36.85 kg/m.  General: Well nourished, well developed, in no acute distress Head: Atraumatic, normal size  Eyes: PEERLA, EOMI  Neck: Supple, JVD 8 to 10 cm of water Endocrine: No thryomegaly Cardiac: Normal S1, S2; irregular, no murmurs Lungs: Diminished breath sounds bilaterally, rhonchi noted Abd: Soft, nontender, no hepatomegaly  Ext: 2+ pitting edema Musculoskeletal: No deformities, BUE and BLE strength normal and equal Skin: Warm and dry, no rashes   Neuro: Alert and oriented to person, place, time, and situation, CNII-XII grossly intact, no focal deficits  Psych: Normal mood and affect   Cardiac Studies  TTE 06/18/2024  1. Left ventricular ejection fraction, by estimation, is 60 to 65%. The  left ventricle has normal function. The left ventricle has no regional  wall motion  abnormalities. There is moderate asymmetric left ventricular  hypertrophy of the basal-septal  segment. Left ventricular diastolic parameters are indeterminate.   2. Right ventricular systolic function is normal. The right ventricular  size is mildly enlarged. There is mildly elevated pulmonary artery  systolic pressure. The estimated right ventricular systolic pressure is  43.7 mmHg.   3. Left atrial size was moderately dilated.   4. Right atrial size was moderately dilated.   5. The mitral valve is degenerative. Trivial mitral valve regurgitation.  Moderate mitral stenosis. Severe mitral annular calcification. MG ,  MVA 1.4cm^2 by continuity equation.   6. The aortic valve is tricuspid. Aortic valve regurgitation is not  visualized. Aortic valve sclerosis/calcification is present, without any  evidence of aortic stenosis.    Patient Profile  MEAH JIRON is a 89 y.o. female with diabetes, CKD stage IV, tobacco abuse, pulmonary hypertension, persistent atrial fibrillation on Eliquis , tachycardia-bradycardia managed without pacemaker, CAD, HFpEF, anemia admitted on 07/01/2024 for acute hypoxic respiratory failure.  Symptoms are more consistent with right heart failure.  Likely has COPD exacerbation.  Assessment & Plan   # Acute on chronic hypoxic respiratory failure # COPD # Acute HFpEF, likely right heart failure in the setting of lung disease - Repeated episodes of respiratory failure.  Lung imaging and consistent with pulmonary edema.  She does have evidence of volume overload but this is likely related right heart failure in the setting of COPD and pulmonary hypertension.  I reviewed her echo from earlier in July.  She likely has significant RV dysfunction. - Would continue with diuresis today.  Lasix  40 mg IV twice daily.  Close monitoring of kidney function. - Right heart catheterization was discussed by team yesterday.  She is interested.  We will plan for this on Monday.   N.p.o. at midnight tomorrow.  We will plan for transition to heparin  for right heart cath. - We did have a discussion about salt intake.  Regardless of the etiology right heart dysfunction and left heart dysfunction are best treated with salt reduction.  She has been consuming lots of salty meals and fast food.  We discussed the importance of cessation of salt.  Informed Consent   Shared Decision Making/Informed Consent The risks, including but not limited to, [bleeding or vascular complications (1 in 500), pneumothorax (1 in 1600), arrhythmia (1 in 1000) and death (1 in 5000)], benefits (diagnostic support and/or management of heart failure, pulmonary hypertension) and alternatives of a right heart catheterization were discussed in detail with Ms. Hegstrom and she is willing to proceed.      # Persistent atrial fibrillation # Tachycardia bradycardia/no pacer - Rate controlled on no medications.  Does not have a pacer in place.  Seems to be managed medically. - Stop Eliquis .  Transition to heparin  drip in anticipation for heart cath on Monday.  # COPD - Would benefit from aggressive COPD treatment.  Still rhonchorous and diminished breath sounds bilaterally.  # CAD - Remote history.  No angina.  Not on aspirin  as she is on Eliquis  at home.  Continue statin.  # CKD stage IV - Close monitoring with diuresis.  # Goals Care - Would consider palliative care consultation.  She has advanced lung disease and likely significant right heart dysfunction.  We are treating this medically with plans for a heart catheterization which is reasonable to delineate the etiology but given repeated admissions and advanced age would benefit from aggressive goals of care discussion.     For questions or updates, please contact [facility] Please consult [email or web address] for contact info under         Signed, Darryle T. Barbaraann, MD, [facility] [place]  [facility]  07/03/2024 9:46 AM

## 2024-07-03 NOTE — Evaluation (Signed)
 Occupational Therapy Evaluation Patient Details Name: Jenny [name] MRN: [id number] DOB: 1933/11/20 Today's Date: 07/03/2024   History of Present Illness   89 yo female admitted 07/01/2024 from home with SOB, admitted for CHF exacerbation. PMH: HFpEF, CKD, HTN, T2DM, COPD, Afib on Eliquis , CAD, HLD     Clinical Impressions Pt admitted based on above, and was seen based on problem list below. PTA pt was independent with ADLs and IADLs. Today pt is mod I for ADLs with use of RW. Requiring increased time and effort. Pt would benefit from resuming HHOT upon d/c. Pt scheduled for heart cath, will continue to follow post-op.        If plan is discharge home, recommend the following:   A little help with walking and/or transfers;A little help with bathing/dressing/bathroom;Assistance with cooking/housework;Help with stairs or ramp for entrance     Functional Status Assessment   Patient has had a recent decline in their functional status and demonstrates the ability to make significant improvements in function in a reasonable and predictable amount of time.     Equipment Recommendations   None recommended by OT      Precautions/Restrictions   Precautions Precautions: None Recall of Precautions/Restrictions: Intact Restrictions Weight Bearing Restrictions Per Provider Order: No     Mobility Bed Mobility Overal bed mobility: Modified Independent                  Transfers Overall transfer level: Modified independent Equipment used: Rolling walker (2 wheels) Transfers: Sit to/from Stand, Bed to chair/wheelchair/BSC Sit to Stand: Modified independent (Device/Increase time)     Step pivot transfers: Modified independent (Device/Increase time)     General transfer comment: Mod I, increased time and effort      Balance Overall balance assessment: Needs assistance Sitting-balance support: No upper extremity supported, Feet supported Sitting balance-Leahy  Scale: Good     Standing balance support: Single extremity supported, Reliant on assistive device for balance Standing balance-Leahy Scale: Poor     ADL either performed or assessed with clinical judgement   ADL Overall ADL's : Modified independent     General ADL Comments: Mod I, increased time d/t fatigue levels     Vision Patient Visual Report: No change from baseline Vision Assessment?: No apparent visual deficits            Pertinent Vitals/Pain Pain Assessment Pain Assessment: No/denies pain Pain Intervention(s): Monitored during session     Extremity/Trunk Assessment Upper Extremity Assessment Upper Extremity Assessment: Generalized weakness   Lower Extremity Assessment Lower Extremity Assessment: Defer to PT evaluation   Cervical / Trunk Assessment Cervical / Trunk Assessment: Kyphotic   Communication Communication Communication: No apparent difficulties   Cognition Arousal: Alert Behavior During Therapy: WFL for tasks assessed/performed Cognition: No apparent impairments     Following commands: Intact       Cueing  General Comments   Cueing Techniques: Verbal cues  VSS on RA           Home Living Family/patient expects to be discharged to:: Private residence Living Arrangements: Alone Available Help at Discharge: Family;Neighbor;Available PRN/intermittently Type of Home: House Home Access: Ramped entrance     Home Layout: One level     Bathroom Shower/Tub: Tub/shower unit;Sponge bathes at baseline   Bathroom Toilet: Handicapped height Bathroom Accessibility: No   Home Equipment: Rollator (4 wheels);BSC/3in1;Tub bench;Grab bars - tub/shower          Prior Functioning/Environment Prior Level of Function : Independent/Modified Independent  Mobility Comments: ambulatory with rollator ADLs Comments: ind    OT Problem List: Decreased strength;Decreased activity tolerance;Impaired balance (sitting and/or  standing);Decreased knowledge of use of DME or AE;Cardiopulmonary status limiting activity   OT Treatment/Interventions: Self-care/ADL training;Therapeutic exercise;DME and/or AE instruction;Therapeutic activities;Patient/family education;Balance training      OT Goals(Current goals can be found in the care plan section)   Acute Rehab OT Goals Patient Stated Goal: To breathe better OT Goal Formulation: With patient Time For Goal Achievement: 07/01/24 Potential to Achieve Goals: Good   OT Frequency:  Min 1X/week    Co-evaluation              AM-PAC OT 6 Clicks Daily Activity     Outcome Measure Help from another person eating meals?: None Help from another person taking care of personal grooming?: None Help from another person toileting, which includes using toliet, bedpan, or urinal?: None Help from another person bathing (including washing, rinsing, drying)?: None Help from another person to put on and taking off regular upper body clothing?: None Help from another person to put on and taking off regular lower body clothing?: None 6 Click Score: 24   End of Session Equipment Utilized During Treatment: Gait belt;Rolling walker (2 wheels) Nurse Communication: Mobility status  Activity Tolerance: Patient tolerated treatment well Patient left: in bed;with call bell/phone within reach  OT Visit Diagnosis: Unsteadiness on feet (R26.81);Other abnormalities of gait and mobility (R26.89);Muscle weakness (generalized) (M62.81);History of falling (Z91.81)                Time: 8899-8887 OT Time Calculation (min): 12 min Charges:  OT General Charges $OT Visit: 1 Visit OT Evaluation $OT Eval Low Complexity: 1 Low  Adrianne BROCKS, OT  Acute Rehabilitation Services Office [phone] Secure chat preferred   Adrianne GORMAN Savers 07/03/2024, 12:39 PM

## 2024-07-03 NOTE — Progress Notes (Signed)
 PROGRESS NOTE    Jenny [name]  FMW:[id number] DOB: [birth year] DOA: 07/01/2024 PCP: Ileen Rosaline NOVAK, NP   Brief Narrative:  Patient is an 89 year old obese Caucasian female with a past medical history significant for but not limited to chronic diastolic CHF, atrial fibrillation on anticoagulation with Eliquis , COPD, hypertension, tachybradycardia syndrome, hyperlipidemia, CAD and other comorbidities who presented to the ED with worsening shortness of breath and dyspnea.  Patient was recently admitted and discharged on 7/17 for CHF and COPD exacerbation.  She presented with worsening dyspnea and states that she has had some dietary indiscretion and admitted to eating [employer] since being discharged.  Thinks she got more fluid now.  Recently had a repeat echocardiogram done about 10 days ago.  Given her worsening leg swelling and shortness of breath she presented to the ED for further evaluation and is being admitted for acute respiratory failure with hypoxia in the setting of likely CHF exacerbation.  She is currently being diuresed and cardiology been consulted for further evaluation recommendations and planning RHC on 7/28.  Assessment and Plan:  Acute respiratory failure with hypoxia in the setting of acute on chronic diastolic CHF as well as ? COPD component: Presents with worsening dyspnea on exertion and leg swelling.  Recently had an echocardiogram which showed an EF of 60 to 65% with biatrial dilation and mild pulmonary artery pressures.  Currently she is not wheezing and denies any increase in her sputum production.  She was started on IV Lasix  20 mg and increased increase diuresis to 40 mg twice daily and Cardiology recommending continuing this Today.  Strict I's and O's and daily weights will need to monitor volume status.  She did receive Solu-Medrol  in the ED but is now on a prednisone  taper however unlikely that this is COPD. Allegedly her dry weight is 195 lbs. Will continue Xopenex   and Atrovent  every 6 scheduled and we will order flutter valve, incentive spirometer and add guaifenesin  1200 mg twice daily.  Will need repeat chest x-ray in the a.m. cardiology been consulted for further evaluation and are tentatively planning a right heart catheterization for further evaluation delineation; Cardiology is transitioning her to Heparin  gtt in anticipation of RHC on 07/05/24  COPD with prior history of tobacco abuse: Currently not wheezing and has quit smoking about 3 years ago.  Continue monitor respiratory status and continue nebs as above and guaifenesin .  Do not feel strong indication for systemic IV steroids but will continue the Prednisone  40 mg that was started x 5 Days. C/w Xopenex /Atrovent  q6h for now and C/w Guaifenesin  1200 mg po BID  Persistent atrial fibrillation with SVR and bradycardia: She is a CHA2DS2-VASc score of 7 and will continue monitor on telemetry; Continued with Apixaban  2.5 mg  p.o. twice daily but Cardiology is now   CAD status post stenting with DES to the right RCA /close mildly elevated troponins: Troponin trend is flat and elevation is likely secondary to demand ischemia from pulmonary hypertension and volume overload.  Continuing to monitor for any signs and symptoms of chest pain.  Continuing Rosuvastatin  20 mg p.o. daily and she is not on aspirin  due to her being on apixaban  for atrial fibrillation; No current Angina   Hyperlipidemia: Continue Rosuvastatin  20 mg p.o. daily  AKI on CKD stage IIIb: Baseline creatinine is around 1.4 BUN/Cr Trend: Recent Labs  Lab 06/21/24 0520 06/22/24 0431 06/23/24 0357 06/24/24 0437 07/01/24 1304 07/02/24 0242 07/03/24 0334  BUN 62* 61* 56* 63* 43* 47* 50*  CREATININE 2.00* 1.85* 1.63* 1.74* 1.81* 2.15* 2.02*  -Cautious Diuresis as above and Cardiology giving her IV Fuorsemide 40 mg x2 doses today  -Avoid Nephrotoxic Medications, Contrast Dyes, Hypotension and Dehydration to Ensure Adequate Renal Perfusion and  will need to Renally Adjust Meds -Continue to Monitor and Trend Renal Function carefully and repeat CMP in the AM   Diabetes Mellitus Type 2:  Hemoglobin A1c was 6.6. C/w Sensitive Novolog  SSI AC. CTM CBGs per Protocol and current Trend: Recent Labs  Lab 07/03/24 1034  GLUCAP 269*   Leukocytosis: WBC went from 9.2 -> 17.7 in the setting of Steroid Demargination. Received IV Solumedrol 125 mg in the AM and now on Prednisone  40 mg po Daily x5 Days. CTM for S/Sx of Infection. Repeat CBC in the AM  Normocytic Anemia: Hgb/Hct Trend:  Recent Labs  Lab 06/16/24 1441 06/17/24 0410 07/01/24 1304 07/02/24 0242 07/03/24 0334  HGB 11.6* 11.4* 10.9* 10.2* 9.9*  HCT 35.3* 35.5* 35.1* 32.2* 30.6*  MCV 92.2 92.0 94.6 92.0 90.0  -Check Anemia Panel in the AM. CTM for S/Sx of Bleeding; No overt bleeding noted. Repeat CBC in the AM  GERD/GI Prophylaxis: C/w PPI w/ Pantoprazole  40 mg po daily   Hypoalbuminemia: Patient's Albumin Lvls ranging from 2.7-3.0. (Was 2.8 this AM) CTM and Trend and repeat CMP in the AM  Class II Obesity: Complicates overall prognosis and care. Estimated body mass index is 36.85 kg/m as calculated from the following:   Height as of this encounter: 5' 2 (1.575 m).   Weight as of this encounter: 91.4 kg. Weight Loss and Dietary Counseling given   DVT prophylaxis:  Heparin  gtt    Code Status: Full Code Family Communication: No family present @ bedside   Disposition Plan:  Level of care: Telemetry Cardiac Status is: Inpatient Remains inpatient appropriate because: Will be undergoing RHC Monday and getting diuresed    Consultants:  Cardiology  Procedures:  As delineated as above  Antimicrobials:  Anti-infectives (From admission, onward)    None       Subjective: Seen and examined at bedside and she is definitely doing a bit better.  No real swelling in her legs.  Thinks her breathing is a little better and she is not short of breath.  Getting nebulized  breathing treatment currently.  Denies any other concerns or complaints this time  Objective: Vitals:   07/03/24 1000 07/03/24 1004 07/03/24 1230 07/03/24 1235  BP:    (!) 154/87  Pulse:      Resp:   20 12  Temp:   98.6 F (37 C)   TempSrc:      SpO2:  93%    Weight: 91.4 kg     Height: 5' 2 (1.575 m)       Intake/Output Summary (Last 24 hours) at 07/03/2024 1343 Last data filed at 07/03/2024 1235 Gross per 24 hour  Intake 840 ml  Output 3025 ml  Net -2185 ml   Filed Weights   07/02/24 0310 07/03/24 0517 07/03/24 1000  Weight: 92.1 kg 91.4 kg 91.4 kg   Examination: Physical Exam:  Constitutional: WN/WD obese elderly Caucasian female no acute distress Respiratory: Diminished to auscultation bilaterally with some coarse breath sounds and does have some slight crackles with some mild rhonchi but no appreciable wheezing or rales. Normal respiratory effort and patient is not tachypenic. No accessory muscle use.  Unlabored breathing Cardiovascular: RRR, no murmurs / rubs / gallops. S1 and S2 auscultated. No extremity edema.  Abdomen:  Soft, non-tender, distended secondary to body habitus.  Bowel sounds positive.  GU: Deferred. Musculoskeletal: No clubbing / cyanosis of digits/nails. No joint deformity upper and lower extremities.  Skin: No rashes, lesions, ulcers on limited skin evaluation. No induration; Warm and dry.  Neurologic: CN 2-12 grossly intact with no focal deficits. Romberg sign and cerebellar reflexes not assessed.  Psychiatric: Normal judgment and insight. Alert and oriented x 3. Normal mood and appropriate affect.   Data Reviewed: I have personally reviewed following labs and imaging studies  CBC: Recent Labs  Lab 07/01/24 1304 07/02/24 0242 07/03/24 0334  WBC 11.2* 9.2 17.7*  NEUTROABS 9.5*  --  16.3*  HGB 10.9* 10.2* 9.9*  HCT 35.1* 32.2* 30.6*  MCV 94.6 92.0 90.0  PLT 252 248 242   Basic Metabolic Panel: Recent Labs  Lab 07/01/24 1304 07/02/24 0242  07/03/24 0334  NA 139 135 135  K 3.3* 3.8 3.6  CL 98 95* 98  CO2 27 25 25   GLUCOSE 159* 355* 137*  BUN 43* 47* 50*  CREATININE 1.81* 2.15* 2.02*  CALCIUM  6.6* 6.7* 6.9*  MG  --   --  2.0  PHOS  --   --  4.0   GFR: Estimated Creatinine Clearance: 20.2 mL/min (A) (by C-G formula based on SCr of 2.02 mg/dL (H)). Liver Function Tests: Recent Labs  Lab 07/01/24 1304 07/02/24 0242 07/03/24 0334  AST 17 21 15   ALT 21 20 18   ALKPHOS 86 90 84  BILITOT 1.2 1.0 0.8  PROT 6.2* 6.0* 6.0*  ALBUMIN 2.9* 2.7* 2.8*   No results for input(s): LIPASE, AMYLASE in the last 168 hours. No results for input(s): AMMONIA in the last 168 hours. Coagulation Profile: No results for input(s): INR, PROTIME in the last 168 hours. Cardiac Enzymes: No results for input(s): CKTOTAL, CKMB, CKMBINDEX, TROPONINI in the last 168 hours. BNP (last 3 results) No results for input(s): PROBNP in the last 8760 hours. HbA1C: No results for input(s): HGBA1C in the last 72 hours. CBG: Recent Labs  Lab 07/03/24 1034  GLUCAP 269*   Lipid Profile: No results for input(s): CHOL, HDL, LDLCALC, TRIG, CHOLHDL, LDLDIRECT in the last 72 hours. Thyroid  Function Tests: Recent Labs    07/03/24 0334  TSH 0.369   Anemia Panel: No results for input(s): VITAMINB12, FOLATE, FERRITIN, TIBC, IRON , RETICCTPCT in the last 72 hours. Sepsis Labs: Recent Labs  Lab 07/01/24 1325 07/01/24 1522  LATICACIDVEN 3.1* 1.7   Recent Results (from the past 240 hours)  Resp panel by RT-PCR (RSV, Flu A&B, Covid) Anterior Nasal Swab     Status: None   Collection Time: 07/01/24  1:04 PM   Specimen: Anterior Nasal Swab  Result Value Ref Range Status   SARS Coronavirus 2 by RT PCR NEGATIVE NEGATIVE Final   Influenza A by PCR NEGATIVE NEGATIVE Final   Influenza B by PCR NEGATIVE NEGATIVE Final    Comment: (NOTE) The Xpert Xpress SARS-CoV-2/FLU/RSV plus assay is intended as an aid in the  diagnosis of influenza from Nasopharyngeal swab specimens and should not be used as a sole basis for treatment. Nasal washings and aspirates are unacceptable for Xpert Xpress SARS-CoV-2/FLU/RSV testing.  Fact Sheet for Patients: BloggerCourse.com  Fact Sheet for Healthcare Providers: SeriousBroker.it  This test is not yet approved or cleared by the [place]  FDA and has been authorized for detection and/or diagnosis of SARS-CoV-2 by FDA under an Emergency Use Authorization (EUA). This EUA will remain in effect (meaning this test can be  used) for the duration of the COVID-19 declaration under Section 564(b)(1) of the Act, 21 U.S.C. section 360bbb-3(b)(1), unless the authorization is terminated or revoked.     Resp Syncytial Virus by PCR NEGATIVE NEGATIVE Final    Comment: (NOTE) Fact Sheet for Patients: BloggerCourse.com  Fact Sheet for Healthcare Providers: SeriousBroker.it  This test is not yet approved or cleared by the [place]  FDA and has been authorized for detection and/or diagnosis of SARS-CoV-2 by FDA under an Emergency Use Authorization (EUA). This EUA will remain in effect (meaning this test can be used) for the duration of the COVID-19 declaration under Section 564(b)(1) of the Act, 21 U.S.C. section 360bbb-3(b)(1), unless the authorization is terminated or revoked.  Performed at [facility], [address] [place], [place] [address]     Radiology Studies: DG CHEST PORT 1 VIEW Result Date: 07/03/2024 CLINICAL DATA:  141880 SOB (shortness of breath) [id number] EXAM: PORTABLE CHEST - 1 VIEW COMPARISON:  07/01/2024 FINDINGS: Coarse central peribronchial thickening.  No focal airspace disease. Heart size and mediastinal contours are within normal limits. Aortic Atherosclerosis (ICD10-170.0). No effusion. Visualized bones unremarkable. IMPRESSION: Chronic  central peribronchial thickening. No focal airspace disease. Electronically Signed   By: JONETTA Faes M.D.   On: 07/03/2024 07:55   DG Chest 2 View Result Date: 07/01/2024 CLINICAL DATA:  Shortness of breath. Weight gain. Concern for fluid retention. EXAM: CHEST - 2 VIEW COMPARISON:  06/16/2024 FINDINGS: Chronic cardiomegaly. Unchanged mediastinal contours. Aortic atherosclerosis. Chronic hyperinflation. Chronic peribronchial thickening. No convincing pleural effusions. No confluent airspace disease. No pneumothorax. IMPRESSION: 1. Chronic cardiomegaly.  Aortic Atherosclerosis (ICD10-I70.0). 2. Chronic hyperinflation and bronchial thickening. 3. No pleural effusions or convincing pulmonary edema. Electronically Signed   By: Andrea Gasman M.D.   On: 07/01/2024 14:20   Scheduled Meds:  dorzolamide   1 drop Both Eyes BID   furosemide   40 mg Intravenous BID   gabapentin   100 mg Oral q AM   gabapentin   200 mg Oral QHS   guaiFENesin   1,200 mg Oral BID   insulin  aspart  0-9 Units Subcutaneous TID WC   ipratropium  0.5 mg Nebulization Q6H   latanoprost   1 drop Both Eyes q AM   levalbuterol   0.63 mg Nebulization Q6H   pantoprazole   40 mg Oral Daily   predniSONE   40 mg Oral Q breakfast   rosuvastatin   20 mg Oral Daily   sodium chloride  flush  3 mL Intravenous Q12H   Continuous Infusions:  heparin  1,000 Units/hr (07/03/24 1118)    LOS: 2 days   Alejandro Marker, DO [facility] Available via Epic secure chat 7am-7pm After these hours, please refer to coverage provider listed on [email or web address] 07/03/2024, 1:43 PM

## 2024-07-03 NOTE — Plan of Care (Signed)
  Problem: Education: Goal: Knowledge of General Education information will improve Description: Including pain rating scale, medication(s)/side effects and non-pharmacologic comfort measures Outcome: Progressing   Problem: Clinical Measurements: Goal: Will remain free from infection Outcome: Progressing Goal: Respiratory complications will improve Outcome: Progressing   Problem: Pain Managment: Goal: General experience of comfort will improve and/or be controlled Outcome: Progressing

## 2024-07-03 NOTE — Progress Notes (Signed)
 PHARMACY - ANTICOAGULATION CONSULT NOTE  Pharmacy Consult for IV Heparin  Indication: atrial fibrillation  Allergies  Allergen Reactions   Aspirin  Other (See Comments)    Can take 81 mg not 325 mg -bleeding   Entresto  [Sacubitril -Valsartan ] Shortness Of Breath    COPD and reports increased SOB with Entresto    Atorvastatin  Itching    Itching and myaliga  Other Reaction(s): itching, rash, Other (See Comments)   Cyclobenzaprine     Other reaction(s): muscle relaxers-ulcer hemorrhage (hospitalized)  Other Reaction(s): Other (See Comments), ulcer hemorrhage   Lactose Intolerance (Gi) Other (See Comments)    GI upset   Meperidine Hcl Other (See Comments)    Not known   Pravastatin  Rash    rash  Other Reaction(s): Other (See Comments)   Propoxyphene     Other reaction(s): pain meds, hallucination, vomiting  Other Reaction(s): Hallucination, hallucinations, vomiting, Other (See Comments), Unknown   Wound Dressing Adhesive     Other reaction(s): red, stings   Patient Measurements: Height: 5' 2 (157.5 cm) Weight: 91.4 kg (201 lb 8 oz) IBW/kg (Calculated) : 50.1 HEPARIN  DW (KG): 71.3 HEPARIN  DW (KG): 71.3  Vital Signs: Temp: 97.7 F (36.5 C) (07/26 2015) Temp Source: Oral (07/26 2015) BP: 147/74 (07/26 2015) Pulse Rate: 58 (07/26 2015)  Labs: Recent Labs    07/01/24 1304 07/01/24 1517 07/02/24 0242 07/03/24 0334 07/03/24 2059  HGB 10.9*  --  10.2* 9.9*  --   HCT 35.1*  --  32.2* 30.6*  --   PLT 252  --  248 242  --   APTT  --   --   --   --  49*  CREATININE 1.81*  --  2.15* 2.02*  --   TROPONINIHS 40* 41*  --   --   --    Estimated Creatinine Clearance: 20.2 mL/min (A) (by C-G formula based on SCr of 2.02 mg/dL (H)).  Medical History: Past Medical History:  Diagnosis Date   ACUT DUOD ULCER W/HEMORR&PERF W/O MENTION OBST 10/05/2009   NSAID induced   ALLERGIC RHINITIS CAUSE UNSPECIFIED    ANEMIA-NOS    CAD (coronary artery disease) 06/08/2009   DEs RCA  with 70% LAD and EF 60%   CHF (congestive heart failure) (HCC)    COPD    mild obst on PFTs 03/2010   Diabetes mellitus 06/2010 dx   Mild, diet controlled   GLAUCOMA    HYPERLIPIDEMIA    HYPERTENSION, BENIGN    MYOCARDIAL INFARCTION 06/08/2009   des to rca   Persistent atrial fibrillation (HCC)    Dx 08/2021   TOBACCO ABUSE     Medications:  Scheduled:   dorzolamide   1 drop Both Eyes BID   gabapentin   100 mg Oral q AM   gabapentin   200 mg Oral QHS   guaiFENesin   1,200 mg Oral BID   insulin  aspart  0-9 Units Subcutaneous TID WC   ipratropium  0.5 mg Nebulization Q6H   latanoprost   1 drop Both Eyes q AM   levalbuterol   0.63 mg Nebulization Q6H   pantoprazole   40 mg Oral Daily   predniSONE   40 mg Oral Q breakfast   rosuvastatin   20 mg Oral Daily   sodium chloride  flush  3 mL Intravenous Q12H   Infusions:   heparin  1,000 Units/hr (07/03/24 1118)   PRN: acetaminophen  **OR** acetaminophen , hydrALAZINE , nitroGLYCERIN   Assessment: Patient is an 89 YO F with PMH of diabetes, CKD stage IV, tobacco abuse, persistent atrial fibrillation on Eliquis  (last dose  7/25 at 2100), tachycardia-bradycardia managed without pacemaker, CAD, HFpEF, anemia admitted on 07/01/2024 for acute hypoxic respiratory failure.    Patient has Afib and is being transitioned from Eliquis  to IV Heparin  in preparation for a heart cath on Monday. Given recent Eliquis  dose, heparin  level likely elevated; therefore will monitor aPTT at this time.   First aPTT subtherapeutic at 49 sec on heparin  1000 units/h at 7/26 PM. No issues with heparin  infusion or bleeding documented.   Goal of Therapy:  Heparin  level 0.3-0.7 units/ml aPTT 66-102 seconds Monitor platelets by anticoagulation protocol: Yes  Plan:  Increase heparin  infusion to 1100 units/h.  Check next aPTT and heparin  level with daily AM labs.  Check anti-Xa level daily while on heparin  Continue to monitor CBC and for signs/symptoms of bleeding  Maurilio Patten,  PharmD PGY1 Pharmacy Resident [facility] 07/03/2024 9:36 PM

## 2024-07-04 ENCOUNTER — Inpatient Hospital Stay (HOSPITAL_COMMUNITY)

## 2024-07-04 DIAGNOSIS — N179 Acute kidney failure, unspecified: Secondary | ICD-10-CM | POA: Diagnosis not present

## 2024-07-04 DIAGNOSIS — I272 Pulmonary hypertension, unspecified: Secondary | ICD-10-CM | POA: Diagnosis not present

## 2024-07-04 DIAGNOSIS — I5033 Acute on chronic diastolic (congestive) heart failure: Secondary | ICD-10-CM | POA: Diagnosis not present

## 2024-07-04 DIAGNOSIS — N184 Chronic kidney disease, stage 4 (severe): Secondary | ICD-10-CM | POA: Diagnosis not present

## 2024-07-04 DIAGNOSIS — I5031 Acute diastolic (congestive) heart failure: Secondary | ICD-10-CM | POA: Diagnosis not present

## 2024-07-04 LAB — CBC WITH DIFFERENTIAL/PLATELET
Abs Immature Granulocytes: 0.1 K/uL — ABNORMAL HIGH (ref 0.00–0.07)
Basophils Absolute: 0 K/uL (ref 0.0–0.1)
Basophils Relative: 0 %
Eosinophils Absolute: 0 K/uL (ref 0.0–0.5)
Eosinophils Relative: 0 %
HCT: 35.5 % — ABNORMAL LOW (ref 36.0–46.0)
Hemoglobin: 11.1 g/dL — ABNORMAL LOW (ref 12.0–15.0)
Immature Granulocytes: 1 %
Lymphocytes Relative: 4 %
Lymphs Abs: 0.5 K/uL — ABNORMAL LOW (ref 0.7–4.0)
MCH: 28.8 pg (ref 26.0–34.0)
MCHC: 31.3 g/dL (ref 30.0–36.0)
MCV: 92.2 fL (ref 80.0–100.0)
Monocytes Absolute: 0.8 K/uL (ref 0.1–1.0)
Monocytes Relative: 7 %
Neutro Abs: 10.4 K/uL — ABNORMAL HIGH (ref 1.7–7.7)
Neutrophils Relative %: 88 %
Platelets: 265 K/uL (ref 150–400)
RBC: 3.85 MIL/uL — ABNORMAL LOW (ref 3.87–5.11)
RDW: 16.1 % — ABNORMAL HIGH (ref 11.5–15.5)
WBC: 11.9 K/uL — ABNORMAL HIGH (ref 4.0–10.5)
nRBC: 0 % (ref 0.0–0.2)

## 2024-07-04 LAB — FOLATE: Folate: 9.8 ng/mL (ref >5.9)

## 2024-07-04 LAB — RETICULOCYTES
Immature Retic Fract: 28.3 % — ABNORMAL HIGH (ref 2.3–15.9)
RBC.: 3.94 MIL/uL (ref 3.87–5.11)
Retic Count, Absolute: 104.4 K/uL (ref 19.0–186.0)
Retic Ct Pct: 2.7 % (ref 0.4–3.1)

## 2024-07-04 LAB — COMPREHENSIVE METABOLIC PANEL WITH GFR
ALT: 19 U/L (ref 0–44)
AST: 17 U/L (ref 15–41)
Albumin: 3.2 g/dL — ABNORMAL LOW (ref 3.5–5.0)
Alkaline Phosphatase: 92 U/L (ref 38–126)
Anion gap: 14 (ref 5–15)
BUN: 52 mg/dL — ABNORMAL HIGH (ref 8–23)
CO2: 27 mmol/L (ref 22–32)
Calcium: 7.4 mg/dL — ABNORMAL LOW (ref 8.9–10.3)
Chloride: 98 mmol/L (ref 98–111)
Creatinine, Ser: 1.79 mg/dL — ABNORMAL HIGH (ref 0.44–1.00)
GFR, Estimated: 27 mL/min — ABNORMAL LOW (ref >60)
Glucose, Bld: 110 mg/dL — ABNORMAL HIGH (ref 70–99)
Potassium: 3.6 mmol/L (ref 3.5–5.1)
Sodium: 139 mmol/L (ref 135–145)
Total Bilirubin: 0.8 mg/dL (ref 0.0–1.2)
Total Protein: 6.3 g/dL — ABNORMAL LOW (ref 6.5–8.1)

## 2024-07-04 LAB — GLUCOSE, CAPILLARY
Glucose-Capillary: 133 mg/dL — ABNORMAL HIGH (ref 70–99)
Glucose-Capillary: 334 mg/dL — ABNORMAL HIGH (ref 70–99)
Glucose-Capillary: 121 mg/dL — ABNORMAL HIGH (ref 70–99)
Glucose-Capillary: 223 mg/dL — ABNORMAL HIGH (ref 70–99)

## 2024-07-04 LAB — VITAMIN B12: Vitamin B-12: 809 pg/mL (ref 180–914)

## 2024-07-04 LAB — IRON AND TIBC
Iron: 45 ug/dL (ref 28–170)
Saturation Ratios: 9 % — ABNORMAL LOW (ref 10.4–31.8)
TIBC: 524 ug/dL — ABNORMAL HIGH (ref 250–450)
UIBC: 479 ug/dL

## 2024-07-04 LAB — FERRITIN: Ferritin: 43 ng/mL (ref 11–307)

## 2024-07-04 LAB — MAGNESIUM: Magnesium: 2 mg/dL (ref 1.7–2.4)

## 2024-07-04 LAB — PHOSPHORUS: Phosphorus: 4 mg/dL (ref 2.5–4.6)

## 2024-07-04 LAB — APTT: aPTT: 64 s — ABNORMAL HIGH (ref 24–36)

## 2024-07-04 LAB — HEPARIN LEVEL (UNFRACTIONATED): Heparin Unfractionated: >1.1 [IU]/mL — ABNORMAL HIGH (ref 0.30–0.70)

## 2024-07-04 MED ORDER — FUROSEMIDE 10 MG/ML IJ SOLN
40.0000 mg | Freq: Two times a day (BID) | INTRAMUSCULAR | Status: AC
  Administered 2024-07-04 (×2): 40 mg via INTRAVENOUS
  Filled 2024-07-04 (×2): qty 4

## 2024-07-04 MED ORDER — FUROSEMIDE 10 MG/ML IJ SOLN: 40.0000 mg | Freq: Two times a day (BID) | INTRAMUSCULAR | Status: DC

## 2024-07-04 MED ORDER — APIXABAN 2.5 MG PO TABS
2.5000 mg | ORAL_TABLET | Freq: Two times a day (BID) | ORAL | Status: DC
  Administered 2024-07-04 – 2024-07-05 (×3): 2.5 mg via ORAL
  Filled 2024-07-04 (×3): qty 1

## 2024-07-04 NOTE — Progress Notes (Signed)
 Cardiology Progress Note  Patient ID: Jenny [name] MRN: [id number] DOB: [birth year] Date of Encounter: 07/04/2024 Primary Cardiologist: Lonni Cash, MD  Subjective   Chief Complaint: SOB  HPI: Good diuresis.  Reports she has decided against right heart catheterization.  We discussed management would be the same.  Still has volume overload.  ROS:  All other ROS reviewed and negative. Pertinent positives noted in the HPI.     Telemetry  Overnight telemetry shows Afib 60s, which I personally reviewed.     Physical Exam   Vitals:   07/03/24 2015 07/04/24 0050 07/04/24 0514 07/04/24 0739  BP: (!) 147/74 (!) 155/56 134/82 (!) 148/83  Pulse: (!) 58 74 (!) 54 62  Resp: 20 20 15 20   Temp: 97.7 F (36.5 C) (!) 97.5 F (36.4 C) 97.9 F (36.6 C) 97.6 F (36.4 C)  TempSrc: Oral Oral Oral Oral  SpO2: 99% 96% 96% 97%  Weight:   90.1 kg   Height:        Intake/Output Summary (Last 24 hours) at 07/04/2024 1031 Last data filed at 07/04/2024 0840 Gross per 24 hour  Intake 720 ml  Output 3000 ml  Net -2280 ml       07/04/2024    5:14 AM 07/03/2024   10:00 AM 07/03/2024    5:17 AM  Last 3 Weights  Weight (lbs) 198 lb 10.2 oz 201 lb 8 oz 201 lb 8 oz  Weight (kg) 90.1 kg 91.4 kg 91.4 kg    Body mass index is 36.33 kg/m.  General: Well nourished, well developed, in no acute distress Head: Atraumatic, normal size  Eyes: PEERLA, EOMI  Neck: Supple, JVD 7-8 cm water Endocrine: No thryomegaly Cardiac: Normal S1, S2; irregular rhythm Lungs: Clear to auscultation bilaterally, no wheezing, rhonchi or rales  Abd: Soft, nontender, no hepatomegaly  Ext: 1+ pitting edema Musculoskeletal: No deformities, BUE and BLE strength normal and equal Skin: Warm and dry, no rashes   Neuro: Alert and oriented to person, place, time, and situation, CNII-XII grossly intact, no focal deficits  Psych: Normal mood and affect   Cardiac Studies  TTE 06/18/2024  1. Left ventricular ejection  fraction, by estimation, is 60 to 65%. The  left ventricle has normal function. The left ventricle has no regional  wall motion abnormalities. There is moderate asymmetric left ventricular  hypertrophy of the basal-septal  segment. Left ventricular diastolic parameters are indeterminate.   2. Right ventricular systolic function is normal. The right ventricular  size is mildly enlarged. There is mildly elevated pulmonary artery  systolic pressure. The estimated right ventricular systolic pressure is  43.7 mmHg.   3. Left atrial size was moderately dilated.   4. Right atrial size was moderately dilated.   5. The mitral valve is degenerative. Trivial mitral valve regurgitation.  Moderate mitral stenosis. Severe mitral annular calcification. MG ,  MVA 1.4cm^2 by continuity equation.   6. The aortic valve is tricuspid. Aortic valve regurgitation is not  visualized. Aortic valve sclerosis/calcification is present, without any  evidence of aortic stenosis.   Patient Profile  Jenny [name] is a 89 y.o. female with diabetes, CKD stage IV, tobacco abuse, pulmonary hypertension, persistent atrial fibrillation, tachycardia/bradycardia managed without pacemaker, CAD, HFpEF, anemia admitted on 07/01/2024 for acute hypoxic respiratory failure.  Symptoms have been more consistent with right heart failure.  Also with COPD exacerbation.  Assessment & Plan   # Acute on chronic hypoxic respiratory failure secondary to COPD # Acute HFpEF, likely more  consistent with right heart failure in the setting of lung disease # CKD stage IV - Recurrent admissions likely due to indiscretion with salt intake.  Right heart catheterization was discussed with the patient by the team last week.  She was interested.  However she has improved with diuresis.  We discussed that even if she has pulmonary hypertension a predominantly right heart failure the treatment would be the same as HFpEF with salt reduction and fluid  restriction. - Given her age she would like to forego the cardiac cath.  Placed back on Eliquis  today. - Kidney function improving.  Still with volume overload.  Continue 40 mg IV twice daily of Lasix . - Continue with aggressive treatment of COPD per primary team.  # Persistent atrial fibrillation # Tachybradycardia without PPM - No AV nodal agents.  Her bradycardia is being managed conservatively. - Restart Eliquis  2.5 mg twice daily.  She is not wanting to do the right heart catheterization.  I think this is reasonable.   For questions or updates, please contact [facility] Please consult [email or web address] for contact info under      Signed, Darryle T. Barbaraann, MD, [facility]  [facility]  07/04/2024 10:31 AM

## 2024-07-04 NOTE — Plan of Care (Signed)

## 2024-07-04 NOTE — Progress Notes (Signed)
 Mobility Specialist Progress Note:   07/04/24 1432  Mobility  Activity Ambulated with assistance in hallway  Level of Assistance Contact guard assist, steadying assist  Assistive Device Four wheel walker  Distance Ambulated (ft) 300 ft (121ft/ seated break/ 134ft)  Activity Response Tolerated well  Mobility Referral Yes  Mobility visit 1 Mobility  Mobility Specialist Start Time (ACUTE ONLY) 1432  Mobility Specialist Stop Time (ACUTE ONLY) 1457  Mobility Specialist Time Calculation (min) (ACUTE ONLY) 25 min   Pt agreeable to session. Pt seemed to have SOB while sitting EOB but sats showed stable (see below). Pt ambulating well w/ slight trunk flexion. Seated break after 158ft then continued back to room. Pt left EOB w/ all needs met  Pre Mobility:SpO2 94% EOB RA During Mobility:SpO2 87% RA/ SpO2 95% 1L [place] Mobility: SpO2 95% EOB RA  Venetia Keel Mobility Specialist Please Neurosurgeon or Rehab Office at [phone]

## 2024-07-04 NOTE — Progress Notes (Signed)
 PROGRESS NOTE    Jenny [name]  FMW:[id number] DOB: 04/02/1933 DOA: 07/01/2024 PCP: Ileen Rosaline NOVAK, NP   Brief Narrative:  Patient is an 89 year old obese Caucasian female with a past medical history significant for but not limited to chronic diastolic CHF, atrial fibrillation on anticoagulation with Eliquis , COPD, hypertension, tachybradycardia syndrome, hyperlipidemia, CAD and other comorbidities who presented to the ED with worsening shortness of breath and dyspnea.  Patient was recently admitted and discharged on 7/17 for CHF and COPD exacerbation.  She presented with worsening dyspnea and states that she has had some dietary indiscretion and admitted to eating [employer] since being discharged.  Thinks she got more fluid now.  Recently had a repeat echocardiogram done about 10 days ago.  Given her worsening leg swelling and shortness of breath she presented to the ED for further evaluation and is being admitted for acute respiratory failure with hypoxia in the setting of likely CHF exacerbation.  She is currently being diuresed and Cardiology been consulted for further evaluation. Initial plan was for RHC on 7/28 but this is being cancelled and she will continue IV Diuresis today with anticipation changing to orals in the AM.  Assessment and Plan:  Acute respiratory failure with hypoxia in the setting of acute on chronic diastolic CHF as well as ? COPD component: Presents with worsening dyspnea on exertion and leg swelling.  Recently had an echocardiogram which showed an EF of 60 to 65% with biatrial dilation and mild pulmonary artery pressures.  Currently she is not wheezing and denies any increase in her sputum production.  She was started on IV Lasix  20 mg and increased increase diuresis to 40 mg twice daily and Cardiology recommending continuing this agan today.  Strict I's and O's and daily weights will need to monitor volume status. Patient is -4.865 Liters since admission  She did  receive Solu-Medrol  in the ED but is now on a prednisone  taper however unlikely that this is COPD. Allegedly her dry weight is 195 lbs. Will continue Xopenex  and Atrovent  every 6 scheduled and we will order flutter valve, incentive spirometer and add guaifenesin  1200 mg twice daily.  Repeat CXR this AM pending -Initial plan was for a right heart catheterization for further evaluation delineation but given her response to diuretics this is being cancelled and Cardiology is transitioning her off of the Heparin  gtt back to Apixaban  and recommending continuing diuresis IV again today  COPD with prior history of tobacco abuse: Currently not wheezing and has quit smoking about 3 years ago.  Continue monitor respiratory status and continue nebs as above and guaifenesin .  Do not feel strong indication for systemic IV steroids but will continue the Prednisone  40 mg that was started x 5 Days. C/w Xopenex /Atrovent  q6h for now and C/w Guaifenesin  1200 mg po BID; Repeat CXR in the AM  Persistent atrial fibrillation with SVR and bradycardia: She is a CHA2DS2-VASc score of 7 and will continue monitor on telemetry; Cardiology had switched her to a Heparin  gtt in anticipation RHC  on 7/28 but now they are cancelling it given her response to diuretics and will resume Apixaban  2.5 mg po BID  CAD status post stenting with DES to the right RCA /close mildly elevated troponins: Troponin trend is flat and elevation is likely secondary to demand ischemia from pulmonary hypertension and volume overload.  Continuing to monitor for any signs and symptoms of chest pain.  Continuing Rosuvastatin  20 mg p.o. daily and she is not on aspirin  due  to her being on apixaban  for atrial fibrillation; No current Angina   Hyperlipidemia: Continue Rosuvastatin  20 mg p.o. daily  AKI on CKD stage IIIb: Baseline creatinine is around 1.4  BUN/Cr Trend improving w/ Diuresis: Recent Labs  Lab 06/22/24 0431 06/23/24 0357 06/24/24 0437 07/01/24 1304  07/02/24 0242 07/03/24 0334 07/04/24 0805  BUN 61* 56* 63* 43* 47* 50* 52*  CREATININE 1.85* 1.63* 1.74* 1.81* 2.15* 2.02* 1.79*  -Cardiology gave her IV Fuorsemide 40 mg x2 doses yesterday and repeating today; Defer further diuresis to Cardiology and they will likely change to po in the AM -Avoid Nephrotoxic Medications, Contrast Dyes, Hypotension and Dehydration to Ensure Adequate Renal Perfusion and will need to Renally Adjust Meds -Continue to Monitor and Trend Renal Function carefully and repeat CMP in the AM   Diabetes Mellitus Type 2:  Hemoglobin A1c was 6.6. C/w Sensitive Novolog  SSI AC. CTM CBGs per Protocol and current Trend: Recent Labs  Lab 07/03/24 1034 07/03/24 1705 07/03/24 2111 07/04/24 0629  GLUCAP 269* 307* 201* 133*   Leukocytosis: WBC went from 9.2 -> 17.7 -> 11.9 in the setting of Steroid Demargination. Received IV Solumedrol 125 mg in the AM and now on Prednisone  40 mg po Daily x5 Days. CTM for S/Sx of Infection. Repeat CBC in the AM  Normocytic Anemia: Hgb/Hct Trend:  Recent Labs  Lab 06/16/24 1441 06/17/24 0410 07/01/24 1304 07/02/24 0242 07/03/24 0334 07/04/24 0805  HGB 11.6* 11.4* 10.9* 10.2* 9.9* 11.1*  HCT 35.3* 35.5* 35.1* 32.2* 30.6* 35.5*  MCV 92.2 92.0 94.6 92.0 90.0 92.2  -Checked Anemia Panel and it showed an Iron  Lvl of 45, UBIC of 479, TIBC 524, Saturation Ratios of 9%, Ferritin of 43, Folate of 9.8, and Vitamin B12 of 809. CTM for S/Sx of Bleeding; No overt bleeding noted. Repeat CBC in the AM  GERD/GI Prophylaxis: C/w PPI w/ Pantoprazole  40 mg po daily   Hypoalbuminemia: Patient's Albumin Lvls ranging from 2.7-3.2. (Was 3.2 this AM) CTM and Trend and repeat CMP in the AM  Class II Obesity: Complicates overall prognosis and care. Estimated body mass index is 36.33 kg/m as calculated from the following:   Height as of this encounter: 5' 2 (1.575 m).   Weight as of this encounter: 90.1 kg. Weight Loss and Dietary Counseling given   DVT  prophylaxis: apixaban  (ELIQUIS ) tablet 2.5 mg Start: 07/04/24 1130 apixaban  (ELIQUIS ) tablet 2.5 mg    Code Status: Full Code Family Communication: No family present @ bedside  Disposition Plan:  Level of care: Telemetry Cardiac Status is: Inpatient Remains inpatient appropriate because: Needs further clinical improvement and clearance by Cardiology   Consultants:  Cardiology  Procedures:  As delineated as above  Antimicrobials:  Anti-infectives (From admission, onward)    None       Subjective: Seen and examined at bedside and tells me she is doing well and not as dyspneic. States she's lost 6 lbs since yesterday. RHC is being cancelled after discussion w/ Cardiology and she is hoping to go home tomorrow.   Objective: Vitals:   07/03/24 2015 07/04/24 0050 07/04/24 0514 07/04/24 0739  BP: (!) 147/74 (!) 155/56 134/82 (!) 148/83  Pulse: (!) 58 74 (!) 54 62  Resp: 20 20 15 20   Temp: 97.7 F (36.5 C) (!) 97.5 F (36.4 C) 97.9 F (36.6 C) 97.6 F (36.4 C)  TempSrc: Oral Oral Oral Oral  SpO2: 99% 96% 96% 97%  Weight:   90.1 kg   Height:  Intake/Output Summary (Last 24 hours) at 07/04/2024 1035 Last data filed at 07/04/2024 0840 Gross per 24 hour  Intake 720 ml  Output 3000 ml  Net -2280 ml   Filed Weights   07/03/24 0517 07/03/24 1000 07/04/24 0514  Weight: 91.4 kg 91.4 kg 90.1 kg   Examination: Physical Exam:  Constitutional: WN/WD obese elderly Caucasian female in NAD Respiratory: Diminished to auscultation bilaterally w/ coarse breath sounds and had some mild crackles but no wheezing, rales, rhonchi. Normal respiratory effort and patient is not tachypenic. No accessory muscle use. Unlabored breathing and not wearing any supplemental O2 via [place] Cardiovascular: RRR, no murmurs / rubs / gallops. S1 and S2 auscultated. Minimal extremity edema Abdomen: Soft, non-tender, Distended 2/2 body habitus. Bowel sounds positive.  GU: Deferred. Musculoskeletal: No  clubbing / cyanosis of digits/nails. No joint deformity upper and lower extremities.  Skin: No rashes, lesions, ulcers on a limited skin evaluation. No induration; Warm and dry.  Neurologic: CN 2-12 grossly intact with no focal deficits. Romberg sign and cerebellar reflexes not assessed.  Psychiatric: Normal judgment and insight. Alert and oriented x 3. Normal mood and appropriate affect.   Data Reviewed: I have personally reviewed following labs and imaging studies  CBC: Recent Labs  Lab 07/01/24 1304 07/02/24 0242 07/03/24 0334 07/04/24 0805  WBC 11.2* 9.2 17.7* 11.9*  NEUTROABS 9.5*  --  16.3* 10.4*  HGB 10.9* 10.2* 9.9* 11.1*  HCT 35.1* 32.2* 30.6* 35.5*  MCV 94.6 92.0 90.0 92.2  PLT 252 248 242 265   Basic Metabolic Panel: Recent Labs  Lab 07/01/24 1304 07/02/24 0242 07/03/24 0334 07/04/24 0805  NA 139 135 135 139  K 3.3* 3.8 3.6 3.6  CL 98 95* 98 98  CO2 27 25 25 27   GLUCOSE 159* 355* 137* 110*  BUN 43* 47* 50* 52*  CREATININE 1.81* 2.15* 2.02* 1.79*  CALCIUM  6.6* 6.7* 6.9* 7.4*  MG  --   --  2.0 2.0  PHOS  --   --  4.0 4.0   GFR: Estimated Creatinine Clearance: 22.7 mL/min (A) (by C-G formula based on SCr of 1.79 mg/dL (H)). Liver Function Tests: Recent Labs  Lab 07/01/24 1304 07/02/24 0242 07/03/24 0334 07/04/24 0805  AST 17 21 15 17   ALT 21 20 18 19   ALKPHOS 86 90 84 92  BILITOT 1.2 1.0 0.8 0.8  PROT 6.2* 6.0* 6.0* 6.3*  ALBUMIN 2.9* 2.7* 2.8* 3.2*   No results for input(s): LIPASE, AMYLASE in the last 168 hours. No results for input(s): AMMONIA in the last 168 hours. Coagulation Profile: No results for input(s): INR, PROTIME in the last 168 hours. Cardiac Enzymes: No results for input(s): CKTOTAL, CKMB, CKMBINDEX, TROPONINI in the last 168 hours. BNP (last 3 results) No results for input(s): PROBNP in the last 8760 hours. HbA1C: No results for input(s): HGBA1C in the last 72 hours. CBG: Recent Labs  Lab 07/03/24 1034  07/03/24 1705 07/03/24 2111 07/04/24 0629  GLUCAP 269* 307* 201* 133*   Lipid Profile: No results for input(s): CHOL, HDL, LDLCALC, TRIG, CHOLHDL, LDLDIRECT in the last 72 hours. Thyroid  Function Tests: Recent Labs    07/03/24 0334  TSH 0.369   Anemia Panel: Recent Labs    07/04/24 0805  VITAMINB12 809  FOLATE 9.8  FERRITIN 43  TIBC 524*  IRON  45  RETICCTPCT 2.7   Sepsis Labs: Recent Labs  Lab 07/01/24 1325 07/01/24 1522  LATICACIDVEN 3.1* 1.7   Recent Results (from the past 240 hours)  Resp panel by RT-PCR (RSV, Flu A&B, Covid) Anterior Nasal Swab     Status: None   Collection Time: 07/01/24  1:04 PM   Specimen: Anterior Nasal Swab  Result Value Ref Range Status   SARS Coronavirus 2 by RT PCR NEGATIVE NEGATIVE Final   Influenza A by PCR NEGATIVE NEGATIVE Final   Influenza B by PCR NEGATIVE NEGATIVE Final    Comment: (NOTE) The Xpert Xpress SARS-CoV-2/FLU/RSV plus assay is intended as an aid in the diagnosis of influenza from Nasopharyngeal swab specimens and should not be used as a sole basis for treatment. Nasal washings and aspirates are unacceptable for Xpert Xpress SARS-CoV-2/FLU/RSV testing.  Fact Sheet for Patients: BloggerCourse.com  Fact Sheet for Healthcare Providers: SeriousBroker.it  This test is not yet approved or cleared by the [place]  FDA and has been authorized for detection and/or diagnosis of SARS-CoV-2 by FDA under an Emergency Use Authorization (EUA). This EUA will remain in effect (meaning this test can be used) for the duration of the COVID-19 declaration under Section 564(b)(1) of the Act, 21 U.S.C. section 360bbb-3(b)(1), unless the authorization is terminated or revoked.     Resp Syncytial Virus by PCR NEGATIVE NEGATIVE Final    Comment: (NOTE) Fact Sheet for Patients: BloggerCourse.com  Fact Sheet for Healthcare  Providers: SeriousBroker.it  This test is not yet approved or cleared by the [place]  FDA and has been authorized for detection and/or diagnosis of SARS-CoV-2 by FDA under an Emergency Use Authorization (EUA). This EUA will remain in effect (meaning this test can be used) for the duration of the COVID-19 declaration under Section 564(b)(1) of the Act, 21 U.S.C. section 360bbb-3(b)(1), unless the authorization is terminated or revoked.  Performed at [facility], [address] [place], [place] [address]     Radiology Studies: DG CHEST PORT 1 VIEW Result Date: 07/03/2024 CLINICAL DATA:  141880 SOB (shortness of breath) [id number] EXAM: PORTABLE CHEST - 1 VIEW COMPARISON:  07/01/2024 FINDINGS: Coarse central peribronchial thickening.  No focal airspace disease. Heart size and mediastinal contours are within normal limits. Aortic Atherosclerosis (ICD10-170.0). No effusion. Visualized bones unremarkable. IMPRESSION: Chronic central peribronchial thickening. No focal airspace disease. Electronically Signed   By: JONETTA Faes M.D.   On: 07/03/2024 07:55   Scheduled Meds:  apixaban   2.5 mg Oral BID   dorzolamide   1 drop Both Eyes BID   furosemide   40 mg Intravenous BID   gabapentin   100 mg Oral q AM   gabapentin   200 mg Oral QHS   guaiFENesin   1,200 mg Oral BID   insulin  aspart  0-9 Units Subcutaneous TID WC   ipratropium  0.5 mg Nebulization Q6H   latanoprost   1 drop Both Eyes q AM   levalbuterol   0.63 mg Nebulization Q6H   pantoprazole   40 mg Oral Daily   predniSONE   40 mg Oral Q breakfast   rosuvastatin   20 mg Oral Daily   sodium chloride  flush  3 mL Intravenous Q12H   Continuous Infusions:   LOS: 3 days   Alejandro Marker, DO [facility] Available via Epic secure chat 7am-7pm After these hours, please refer to coverage provider listed on [email or web address] 07/04/2024, 10:35 AM

## 2024-07-04 NOTE — Progress Notes (Addendum)
 PHARMACY - ANTICOAGULATION CONSULT NOTE  Pharmacy Consult for IV Heparin  Indication: atrial fibrillation  Allergies  Allergen Reactions   Aspirin  Other (See Comments)    Can take 81 mg not 325 mg -bleeding   Entresto  [Sacubitril -Valsartan ] Shortness Of Breath    COPD and reports increased SOB with Entresto    Atorvastatin  Itching    Itching and myaliga  Other Reaction(s): itching, rash, Other (See Comments)   Cyclobenzaprine     Other reaction(s): muscle relaxers-ulcer hemorrhage (hospitalized)  Other Reaction(s): Other (See Comments), ulcer hemorrhage   Lactose Intolerance (Gi) Other (See Comments)    GI upset   Meperidine Hcl Other (See Comments)    Not known   Pravastatin  Rash    rash  Other Reaction(s): Other (See Comments)   Propoxyphene     Other reaction(s): pain meds, hallucination, vomiting  Other Reaction(s): Hallucination, hallucinations, vomiting, Other (See Comments), Unknown   Wound Dressing Adhesive     Other reaction(s): red, stings   Patient Measurements: Height: 5' 2 (157.5 cm) Weight: 90.1 kg (198 lb 10.2 oz) IBW/kg (Calculated) : 50.1 HEPARIN  DW (KG): 71.3 HEPARIN  DW (KG): 71.3  Vital Signs: Temp: 97.6 F (36.4 C) (07/27 0739) Temp Source: Oral (07/27 0739) BP: 148/83 (07/27 0739) Pulse Rate: 62 (07/27 0739)  Labs: Recent Labs    07/01/24 1304 07/01/24 1517 07/02/24 0242 07/03/24 0334 07/03/24 2059 07/04/24 0805  HGB 10.9*  --  10.2* 9.9*  --  11.1*  HCT 35.1*  --  32.2* 30.6*  --  35.5*  PLT 252  --  248 242  --  265  APTT  --   --   --   --  49* 64*  HEPARINUNFRC  --   --   --   --   --  >1.10*  CREATININE 1.81*  --  2.15* 2.02*  --   --   TROPONINIHS 40* 41*  --   --   --   --    Estimated Creatinine Clearance: 20.1 mL/min (A) (by C-G formula based on SCr of 2.02 mg/dL (H)).  Medical History: Past Medical History:  Diagnosis Date   ACUT DUOD ULCER W/HEMORR&PERF W/O MENTION OBST 10/05/2009   NSAID induced   ALLERGIC  RHINITIS CAUSE UNSPECIFIED    ANEMIA-NOS    CAD (coronary artery disease) 06/08/2009   DEs RCA with 70% LAD and EF 60%   CHF (congestive heart failure) (HCC)    COPD    mild obst on PFTs 03/2010   Diabetes mellitus 06/2010 dx   Mild, diet controlled   GLAUCOMA    HYPERLIPIDEMIA    HYPERTENSION, BENIGN    MYOCARDIAL INFARCTION 06/08/2009   des to rca   Persistent atrial fibrillation (HCC)    Dx 08/2021   TOBACCO ABUSE     Medications:  Scheduled:   dorzolamide   1 drop Both Eyes BID   gabapentin   100 mg Oral q AM   gabapentin   200 mg Oral QHS   guaiFENesin   1,200 mg Oral BID   insulin  aspart  0-9 Units Subcutaneous TID WC   ipratropium  0.5 mg Nebulization Q6H   latanoprost   1 drop Both Eyes q AM   levalbuterol   0.63 mg Nebulization Q6H   pantoprazole   40 mg Oral Daily   predniSONE   40 mg Oral Q breakfast   rosuvastatin   20 mg Oral Daily   sodium chloride  flush  3 mL Intravenous Q12H   Infusions:   heparin  1,100 Units/hr (07/04/24 0813)  PRN: acetaminophen  **OR** acetaminophen , hydrALAZINE , nitroGLYCERIN   Assessment: Patient is an 89 YO F with PMH of diabetes, CKD stage IV, tobacco abuse, persistent atrial fibrillation on Eliquis  (last dose 7/25 at 2100), tachycardia-bradycardia managed without pacemaker, CAD, HFpEF, anemia admitted on 07/01/2024 for acute hypoxic respiratory failure.    Patient has Afib and is being transitioned from Eliquis  to IV Heparin  in preparation for a heart cath on Monday. Given recent Eliquis  dose, heparin  level likely elevated; therefore will monitor aPTT at this time.   07/27 AM update: Heparin  level supratherapeutic at > 1.10 as expected given recent dose of Eliquis . aPTT subtherapeutic at 64 at a heparin  rate of 1100 units/hr. Anti-Xa and aPTT levels are not correlating; will increase heparin  rate. No s/sx of bleeding documented. Hgb, Hct, Plts stable  Goal of Therapy:  Heparin  level 0.3-0.7 units/ml aPTT 66-102 seconds Monitor platelets by  anticoagulation protocol: Yes  Plan:  Increase heparin  infusion to 1250 units/h.  Check next aPTT in 8 hours  Check anti-Xa level and aPTT daily while on heparin  Continue to monitor CBC and for signs/symptoms of bleeding  R. Samual Satterfield, PharmD PGY-1 Acute Care Pharmacy Resident [facility] 07/04/2024 9:25 AM

## 2024-07-05 ENCOUNTER — Inpatient Hospital Stay (HOSPITAL_COMMUNITY)

## 2024-07-05 DIAGNOSIS — R0600 Dyspnea, unspecified: Secondary | ICD-10-CM | POA: Diagnosis not present

## 2024-07-05 DIAGNOSIS — N179 Acute kidney failure, unspecified: Secondary | ICD-10-CM | POA: Diagnosis not present

## 2024-07-05 DIAGNOSIS — I272 Pulmonary hypertension, unspecified: Secondary | ICD-10-CM | POA: Diagnosis not present

## 2024-07-05 DIAGNOSIS — R0602 Shortness of breath: Secondary | ICD-10-CM | POA: Diagnosis not present

## 2024-07-05 LAB — CBC WITH DIFFERENTIAL/PLATELET
Abs Immature Granulocytes: 0.1 K/uL — ABNORMAL HIGH (ref 0.00–0.07)
Basophils Absolute: 0 K/uL (ref 0.0–0.1)
Basophils Relative: 0 %
Eosinophils Absolute: 0 K/uL (ref 0.0–0.5)
Eosinophils Relative: 0 %
HCT: 33.5 % — ABNORMAL LOW (ref 36.0–46.0)
Hemoglobin: 10.6 g/dL — ABNORMAL LOW (ref 12.0–15.0)
Immature Granulocytes: 1 %
Lymphocytes Relative: 4 %
Lymphs Abs: 0.4 K/uL — ABNORMAL LOW (ref 0.7–4.0)
MCH: 28.9 pg (ref 26.0–34.0)
MCHC: 31.6 g/dL (ref 30.0–36.0)
MCV: 91.3 fL (ref 80.0–100.0)
Monocytes Absolute: 0.8 K/uL (ref 0.1–1.0)
Monocytes Relative: 7 %
Neutro Abs: 9.9 K/uL — ABNORMAL HIGH (ref 1.7–7.7)
Neutrophils Relative %: 88 %
Platelets: 246 K/uL (ref 150–400)
RBC: 3.67 MIL/uL — ABNORMAL LOW (ref 3.87–5.11)
RDW: 15.7 % — ABNORMAL HIGH (ref 11.5–15.5)
WBC: 11.2 K/uL — ABNORMAL HIGH (ref 4.0–10.5)
nRBC: 0 % (ref 0.0–0.2)

## 2024-07-05 LAB — PHOSPHORUS: Phosphorus: 4.8 mg/dL — ABNORMAL HIGH (ref 2.5–4.6)

## 2024-07-05 LAB — COMPREHENSIVE METABOLIC PANEL WITH GFR
ALT: 18 U/L (ref 0–44)
AST: 13 U/L — ABNORMAL LOW (ref 15–41)
Albumin: 2.9 g/dL — ABNORMAL LOW (ref 3.5–5.0)
Alkaline Phosphatase: 82 U/L (ref 38–126)
Anion gap: 14 (ref 5–15)
BUN: 57 mg/dL — ABNORMAL HIGH (ref 8–23)
CO2: 28 mmol/L (ref 22–32)
Calcium: 7.5 mg/dL — ABNORMAL LOW (ref 8.9–10.3)
Chloride: 94 mmol/L — ABNORMAL LOW (ref 98–111)
Creatinine, Ser: 2.2 mg/dL — ABNORMAL HIGH (ref 0.44–1.00)
GFR, Estimated: 21 mL/min — ABNORMAL LOW (ref >60)
Glucose, Bld: 141 mg/dL — ABNORMAL HIGH (ref 70–99)
Potassium: 4.5 mmol/L (ref 3.5–5.1)
Sodium: 136 mmol/L (ref 135–145)
Total Bilirubin: 0.9 mg/dL (ref 0.0–1.2)
Total Protein: 6.2 g/dL — ABNORMAL LOW (ref 6.5–8.1)

## 2024-07-05 LAB — GLUCOSE, CAPILLARY
Glucose-Capillary: 124 mg/dL — ABNORMAL HIGH (ref 70–99)
Glucose-Capillary: 234 mg/dL — ABNORMAL HIGH (ref 70–99)

## 2024-07-05 LAB — MAGNESIUM: Magnesium: 2 mg/dL (ref 1.7–2.4)

## 2024-07-05 MED ORDER — FUROSEMIDE 40 MG PO TABS: 40.0000 mg | ORAL_TABLET | Freq: Two times a day (BID) | ORAL | Status: DC

## 2024-07-05 MED ORDER — PREDNISONE 20 MG PO TABS: 40.0000 mg | ORAL_TABLET | Freq: Every day | ORAL | 0 refills | Status: AC

## 2024-07-05 MED ORDER — GUAIFENESIN ER 600 MG PO TB12: 600.0000 mg | ORAL_TABLET | Freq: Two times a day (BID) | ORAL | 0 refills | Status: AC

## 2024-07-05 MED ORDER — LEVALBUTEROL HCL 0.63 MG/3ML IN NEBU: 0.6300 mg | INHALATION_SOLUTION | Freq: Two times a day (BID) | RESPIRATORY_TRACT | Status: DC

## 2024-07-05 MED ORDER — IPRATROPIUM BROMIDE 0.02 % IN SOLN: 0.5000 mg | Freq: Two times a day (BID) | RESPIRATORY_TRACT | Status: DC

## 2024-07-05 MED ORDER — FUROSEMIDE 40 MG PO TABS: 40.0000 mg | ORAL_TABLET | Freq: Two times a day (BID) | ORAL | 0 refills | Status: DC

## 2024-07-05 NOTE — Progress Notes (Signed)
 Mobility Specialist: Progress Note   07/05/24 1000  Mobility  Activity Ambulated with assistance in hallway  Level of Assistance Standby assist, set-up cues, supervision of patient - no hands on  Assistive Device Four wheel walker  Distance Ambulated (ft) 500 ft  Activity Response Tolerated well  Mobility Referral Yes  Mobility visit 1 Mobility  Mobility Specialist Start Time (ACUTE ONLY) 1025  Mobility Specialist Stop Time (ACUTE ONLY) 1049  Mobility Specialist Time Calculation (min) (ACUTE ONLY) 24 min    Pt received in bed, agreeable to mobility session. Completed ambulatory sat test (see following note). SV throughout. Very winded throughout session, took seated break every 100-162ft d/t SOB and fatigue. Pt also stated she is only able to tolerate short bouts of ambulation at baseline d/t previous back issues. Pt desats to SpO2 85-86% on RA but recover quickly to 92-96% RA with seated break and cues for PLB. Returned to room without fault. Left on EOB with all needs met, call bell in reach.   Ileana Lute Mobility Specialist Please contact via SecureChat or Rehab office at [phone]

## 2024-07-05 NOTE — Discharge Summary (Signed)
 Physician Discharge Summary   Patient: Jenny [name] MRN: [id number] DOB: 08-27-1933  Admit date:     07/01/2024  Discharge date: {dischdate:26783}  Discharge Physician: Alejandro Lazarus Marker   PCP: Ileen Rosaline NOVAK, NP   Recommendations at discharge:  {Tip this will not be part of the note when signed- Example include specific recommendations for outpatient follow-up, pending tests to follow-up on. (Optional):26781}  ***  Discharge Diagnoses: Principal Problem:   Acute dyspnea Active Problems:   AKI (acute kidney injury) (HCC)   Pulmonary hypertension, unspecified (HCC)   Shortness of breath  Resolved Problems:   * No resolved hospital problems. *  Hospital Course: Patient is an 89 year old obese Caucasian female with a past medical history significant for but not limited to chronic diastolic CHF, atrial fibrillation on anticoagulation with Eliquis , COPD, hypertension, tachybradycardia syndrome, hyperlipidemia, CAD and other comorbidities who presented to the ED with worsening shortness of breath and dyspnea.  Patient was recently admitted and discharged on 7/17 for CHF and COPD exacerbation.  She presented with worsening dyspnea and states that she has had some dietary indiscretion and admitted to eating [employer] since being discharged.  Thinks she got more fluid now.  Recently had a repeat echocardiogram done about 10 days ago.  Given her worsening leg swelling and shortness of breath she presented to the ED for further evaluation and is being admitted for acute respiratory failure with hypoxia in the setting of likely CHF exacerbation.  She is currently being diuresed and Cardiology been consulted for further evaluation. Initial plan was for RHC on 7/28 but this is being cancelled and she will continue IV Diuresis today with anticipation changing to orals in the AM.  Assessment and Plan:  Acute respiratory failure with hypoxia in the setting of acute on chronic diastolic CHF as  well as ? COPD component: Presents with worsening dyspnea on exertion and leg swelling.  Recently had an echocardiogram which showed an EF of 60 to 65% with biatrial dilation and mild pulmonary artery pressures.  Currently she is not wheezing and denies any increase in her sputum production.  She was started on IV Lasix  20 mg and increased increase diuresis to 40 mg twice daily and Cardiology recommending continuing this agan today.  Strict I's and O's and daily weights will need to monitor volume status. Patient is -4.865 Liters since admission  She did receive Solu-Medrol  in the ED but is now on a prednisone  taper however unlikely that this is COPD. Allegedly her dry weight is 195 lbs. Will continue Xopenex  and Atrovent  every 6 scheduled and we will order flutter valve, incentive spirometer and add guaifenesin  1200 mg twice daily.  Repeat CXR this AM pending -Initial plan was for a right heart catheterization for further evaluation delineation but given her response to diuretics this is being cancelled and Cardiology is transitioning her off of the Heparin  gtt back to Apixaban  and recommending continuing diuresis IV again today  COPD with prior history of tobacco abuse: Currently not wheezing and has quit smoking about 3 years ago.  Continue monitor respiratory status and continue nebs as above and guaifenesin .  Do not feel strong indication for systemic IV steroids but will continue the Prednisone  40 mg that was started x 5 Days. C/w Xopenex /Atrovent  q6h for now and C/w Guaifenesin  1200 mg po BID; Repeat CXR in the AM  Persistent atrial fibrillation with SVR and bradycardia: She is a CHA2DS2-VASc score of 7 and will continue monitor on telemetry; Cardiology had switched  her to a Heparin  gtt in anticipation RHC  on 7/28 but now they are cancelling it given her response to diuretics and will resume Apixaban  2.5 mg po BID  CAD status post stenting with DES to the right RCA /close mildly elevated troponins:  Troponin trend is flat and elevation is likely secondary to demand ischemia from pulmonary hypertension and volume overload.  Continuing to monitor for any signs and symptoms of chest pain.  Continuing Rosuvastatin  20 mg p.o. daily and she is not on aspirin  due to her being on apixaban  for atrial fibrillation; No current Angina   Hyperlipidemia: Continue Rosuvastatin  20 mg p.o. daily  AKI on CKD stage IIIb: Baseline creatinine is around 1.4  BUN/Cr Trend improving w/ Diuresis: Recent Labs  Lab 06/22/24 0431 06/23/24 0357 06/24/24 0437 07/01/24 1304 07/02/24 0242 07/03/24 0334 07/04/24 0805  BUN 61* 56* 63* 43* 47* 50* 52*  CREATININE 1.85* 1.63* 1.74* 1.81* 2.15* 2.02* 1.79*  -Cardiology gave her IV Fuorsemide 40 mg x2 doses yesterday and repeating today; Defer further diuresis to Cardiology and they will likely change to po in the AM -Avoid Nephrotoxic Medications, Contrast Dyes, Hypotension and Dehydration to Ensure Adequate Renal Perfusion and will need to Renally Adjust Meds -Continue to Monitor and Trend Renal Function carefully and repeat CMP in the AM   Diabetes Mellitus Type 2:  Hemoglobin A1c was 6.6. C/w Sensitive Novolog  SSI AC. CTM CBGs per Protocol and current Trend: Recent Labs  Lab 07/03/24 1034 07/03/24 1705 07/03/24 2111 07/04/24 0629  GLUCAP 269* 307* 201* 133*   Leukocytosis: WBC went from 9.2 -> 17.7 -> 11.9 in the setting of Steroid Demargination. Received IV Solumedrol 125 mg in the AM and now on Prednisone  40 mg po Daily x5 Days. CTM for S/Sx of Infection. Repeat CBC in the AM  Normocytic Anemia: Hgb/Hct Trend:  Recent Labs  Lab 06/16/24 1441 06/17/24 0410 07/01/24 1304 07/02/24 0242 07/03/24 0334 07/04/24 0805  HGB 11.6* 11.4* 10.9* 10.2* 9.9* 11.1*  HCT 35.3* 35.5* 35.1* 32.2* 30.6* 35.5*  MCV 92.2 92.0 94.6 92.0 90.0 92.2  -Checked Anemia Panel and it showed an Iron  Lvl of 45, UBIC of 479, TIBC 524, Saturation Ratios of 9%, Ferritin of 43, Folate  of 9.8, and Vitamin B12 of 809. CTM for S/Sx of Bleeding; No overt bleeding noted. Repeat CBC in the AM  GERD/GI Prophylaxis: C/w PPI w/ Pantoprazole  40 mg po daily   Hypoalbuminemia: Patient's Albumin Lvls ranging from 2.7-3.2. (Was 3.2 this AM) CTM and Trend and repeat CMP in the AM  Class II Obesity: Complicates overall prognosis and care. Estimated body mass index is 36.33 kg/m as calculated from the following:   Height as of this encounter: 5' 2 (1.575 m).   Weight as of this encounter: 90.1 kg. Weight Loss and Dietary Counseling given  Assessment and Plan: No notes have been filed under this hospital service. Service: Hospitalist     {Tip this will not be part of the note when signed Body mass index is 35.76 kg/m. ,  Nutrition Documentation    Flowsheet Row ED to Hosp-Admission (Current) from 07/01/2024 in [facility] 3E HF PCU  Nutrition Problem Food and nutrition related knowledge deficit  Etiology limited prior education  Nutrition Goal Patient will meet greater than or equal to 90% of their needs  Interventions Refer to RD note for recommendations  ,  Active Pressure Injury/Wound(s)     None          (  Optional):26781}  {(NOTE) Pain control PDMP Statment (Optional):26782} Consultants: *** Procedures performed: ***  Disposition: {Plan; Disposition:26390} Diet recommendation:  Discharge Diet Orders (From admission, onward)     Start     Ordered   07/05/24 0000  Diet - low sodium heart healthy        07/05/24 1010           {Diet_Plan:26776} DISCHARGE MEDICATION: Allergies as of 07/05/2024       Reactions   Aspirin  Other (See Comments)   Can take 81 mg not 325 mg -bleeding   Entresto  [sacubitril -valsartan ] Shortness Of Breath   COPD and reports increased SOB with Entresto    Atorvastatin  Itching   Itching and myaliga Other Reaction(s): itching, rash, Other (See Comments)   Cyclobenzaprine    Other reaction(s): muscle relaxers-ulcer  hemorrhage (hospitalized) Other Reaction(s): Other (See Comments), ulcer hemorrhage   Lactose Intolerance (gi) Other (See Comments)   GI upset   Meperidine Hcl Other (See Comments)   Not known   Pravastatin  Rash   rash Other Reaction(s): Other (See Comments)   Propoxyphene    Other reaction(s): pain meds, hallucination, vomiting Other Reaction(s): Hallucination, hallucinations, vomiting, Other (See Comments), Unknown   Wound Dressing Adhesive    Other reaction(s): red, stings        Medication List     TAKE these medications    acetaminophen  500 MG tablet Commonly known as: TYLENOL  Take 500 mg by mouth every 6 (six) hours as needed (for knee, hip pain).   acetaminophen -codeine  300-15 MG tablet Commonly known as: TYLENOL  #2 Take 1 tablet by mouth every 8 (eight) hours as needed for moderate pain (pain score 4-6).   albuterol  108 (90 Base) MCG/ACT inhaler Commonly known as: Ventolin  HFA INHALE ONE PUFF EVERY 6 HOURS AS NEEDED FOR SHORTNESS OF BREATH   apixaban  2.5 MG Tabs tablet Commonly known as: ELIQUIS  Take 1 tablet (2.5 mg total) by mouth 2 (two) times daily.   Bevespi  Aerosphere 9-4.8 MCG/ACT Aero Generic drug: Glycopyrrolate -Formoterol  Inhale 2 puffs into the lungs 2 (two) times daily. What changed:  when to take this reasons to take this   dorzolamide  2 % ophthalmic solution Commonly known as: TRUSOPT  Place 1 drop into both eyes 2 (two) times daily.   furosemide  40 MG tablet Commonly known as: LASIX  Take 1 tablet (40 mg total) by mouth 2 (two) times daily. What changed: when to take this   gabapentin  100 MG capsule Commonly known as: NEURONTIN  Take 100-200 mg by mouth See admin instructions. Take 1 capsule (100mg ) by mouth every morning and take 2 capsules (200mg ) by mouth at bedtime   guaiFENesin  600 MG 12 hr tablet Commonly known as: MUCINEX  Take 1 tablet (600 mg total) by mouth 2 (two) times daily for 5 days.   hydrOXYzine 25 MG tablet Commonly  known as: ATARAX Take 25 mg by mouth every 8 (eight) hours as needed for itching.   ipratropium-albuterol  0.5-2.5 (3) MG/3ML Soln Commonly known as: DUONEB Take 3 mLs by nebulization every 6 (six) hours as needed. What changed: reasons to take this   latanoprost  0.005 % ophthalmic solution Commonly known as: XALATAN  Place 1 drop into both eyes in the morning.   metolazone  2.5 MG tablet Commonly known as: ZAROXOLYN  Take 1 tablet (2.5 mg total) by mouth as needed. What changed: reasons to take this   Nitrostat  0.4 MG SL tablet Generic drug: nitroGLYCERIN  DISSOLVE ONE TABLET UNDER TONGUE AS NEEDED FOR CHEST PAIN - MAY REPEAT TWICE-IF NO RELIEF  GO TO NEAREST HOSPITAL ER What changed: See the new instructions.   nystatin  powder Commonly known as: MYCOSTATIN /NYSTOP  Apply topically 3 (three) times daily.   pantoprazole  40 MG tablet Commonly known as: Protonix  Take 1 tablet (40 mg total) by mouth daily.   predniSONE  20 MG tablet Commonly known as: DELTASONE  Take 2 tablets (40 mg total) by mouth daily with breakfast for 2 days. Start taking on: July 06, 2024   rosuvastatin  20 MG tablet Commonly known as: CRESTOR  Take 20 mg by mouth daily.   Stiolto Respimat 2.5-2.5 MCG/ACT Aers Generic drug: Tiotropium Bromide-Olodaterol Inhale 2 puffs into the lungs 2 (two) times daily.        Follow-up Information     [facility]. Go in 16 day(s).   Specialty: Cardiology Why: Hospital follow up at [facility] 07/19/2024 @ 12 noon PLEASE bring a current medication list to appointment Contact information: [address] [place]  [address] [phone]        Ileen Rosaline NOVAK, NP Follow up.   Specialty: Nurse Practitioner Why: Please follow up in a week. Contact information: [address] [place] [address] [phone]                Discharge Exam: Fredricka Weights   07/03/24 1000 07/04/24  0514 07/05/24 0628  Weight: 91.4 kg 90.1 kg 88.7 kg   ***  Condition at discharge: {DC Condition:26389}  The results of significant diagnostics from this hospitalization (including imaging, microbiology, ancillary and laboratory) are listed below for reference.   Imaging Studies: DG CHEST PORT 1 VIEW Result Date: 07/05/2024 CLINICAL DATA:  [id number] with shortness of breath, history of CHF. EXAM: PORTABLE CHEST 1 VIEW COMPARISON:  Portable chest yesterday at 6:16 a.m. FINDINGS: 5:16 a.m. the heart is enlarged. There is central vascular fullness but no evidence of edema. The mitral ring is heavily calcified. The lungs are clear of infiltrates. There is left perihilar linear scar-like opacity. The sulci are sharp. Mediastinum is stable with slight aortic tortuosity, heavy calcification transverse segment. There is dextroscoliosis of the thoracic spine and osteopenia with advanced spondylosis. Comparison with yesterday's study reveals no significant interval change. IMPRESSION: 1. Cardiomegaly with central vascular fullness but no evidence of edema. 2. No other evidence of acute chest disease. 3. Aortic atherosclerosis. Electronically Signed   By: Francis Quam M.D.   On: 07/05/2024 07:24   DG CHEST PORT 1 VIEW Result Date: 07/04/2024 CLINICAL DATA:  Shortness of breath. EXAM: PORTABLE CHEST 1 VIEW COMPARISON:  Chest x-ray dated 07/03/2024. FINDINGS: Mild cardiomegaly with mild vascular congestion. No focal consolidation, pleural effusion or pneumothorax. Atherosclerotic calcification of the aorta. No acute osseous pathology. IMPRESSION: Mild cardiomegaly with mild vascular congestion. Electronically Signed   By: Vanetta Chou M.D.   On: 07/04/2024 10:44   DG CHEST PORT 1 VIEW Result Date: 07/03/2024 CLINICAL DATA:  [id number] SOB (shortness of breath) [id number] EXAM: PORTABLE CHEST - 1 VIEW COMPARISON:  07/01/2024 FINDINGS: Coarse central peribronchial thickening.  No focal airspace disease. Heart size and  mediastinal contours are within normal limits. Aortic Atherosclerosis (ICD10-170.0). No effusion. Visualized bones unremarkable. IMPRESSION: Chronic central peribronchial thickening. No focal airspace disease. Electronically Signed   By: JONETTA Faes M.D.   On: 07/03/2024 07:55   DG Chest 2 View Result Date: 07/01/2024 CLINICAL DATA:  Shortness of breath. Weight gain. Concern for fluid retention. EXAM: CHEST - 2 VIEW COMPARISON:  06/16/2024 FINDINGS: Chronic cardiomegaly. Unchanged mediastinal contours.  Aortic atherosclerosis. Chronic hyperinflation. Chronic peribronchial thickening. No convincing pleural effusions. No confluent airspace disease. No pneumothorax. IMPRESSION: 1. Chronic cardiomegaly.  Aortic Atherosclerosis (ICD10-I70.0). 2. Chronic hyperinflation and bronchial thickening. 3. No pleural effusions or convincing pulmonary edema. Electronically Signed   By: Andrea Gasman M.D.   On: 07/01/2024 14:20   DG HIP UNILAT WITH PELVIS 2-3 VIEWS RIGHT Result Date: 06/21/2024 CLINICAL DATA:  Right hip pain.  No trauma history is submitted. EXAM: DG HIP (WITH OR WITHOUT PELVIS) 2-3V RIGHT COMPARISON:  None Available. FINDINGS: Images are degraded by patient body habitus and overlying pannus. No fracture or dislocation. Moderate bilateral hip joint space narrowing, slightly greater left than right. Vascular calcifications. IMPRESSION: Degenerative change, without acute osseous finding. Limitations secondary to patient body habitus. Electronically Signed   By: Rockey Kilts M.D.   On: 06/21/2024 14:10   ECHOCARDIOGRAM COMPLETE Result Date: 06/18/2024    ECHOCARDIOGRAM REPORT   Patient Name:   DHANA TOTTON Date of Exam: 06/18/2024 Medical Rec #:  [id number]     Height:       62.0 in Accession #:    [id number]    Weight:       198.4 lb Date of Birth:  1933/07/12     BSA:          1.905 m Patient Age:    89 years      BP:           123/86 mmHg Patient Gender: F             HR:           54 bpm. Exam Location:   Inpatient Procedure: 2D Echo, Cardiac Doppler and Color Doppler (Both Spectral and Color            Flow Doppler were utilized during procedure). Indications:    Dyspnea  History:        Patient has prior history of Echocardiogram examinations. CHF,                 Arrythmias:Atrial Fibrillation, Signs/Symptoms:Shortness of                 Breath; Risk Factors:Hypertension.  Sonographer:    CM Referring Phys: [id number] TAYLOR A PARCELLS IMPRESSIONS  1. Left ventricular ejection fraction, by estimation, is 60 to 65%. The left ventricle has normal function. The left ventricle has no regional wall motion abnormalities. There is moderate asymmetric left ventricular hypertrophy of the basal-septal segment. Left ventricular diastolic parameters are indeterminate.  2. Right ventricular systolic function is normal. The right ventricular size is mildly enlarged. There is mildly elevated pulmonary artery systolic pressure. The estimated right ventricular systolic pressure is 43.7 mmHg.  3. Left atrial size was moderately dilated.  4. Right atrial size was moderately dilated.  5. The mitral valve is degenerative. Trivial mitral valve regurgitation. Moderate mitral stenosis. Severe mitral annular calcification. MG , MVA 1.4cm^2 by continuity equation.  6. The aortic valve is tricuspid. Aortic valve regurgitation is not visualized. Aortic valve sclerosis/calcification is present, without any evidence of aortic stenosis. FINDINGS  Left Ventricle: Left ventricular ejection fraction, by estimation, is 60 to 65%. The left ventricle has normal function. The left ventricle has no regional wall motion abnormalities. The left ventricular internal cavity size was normal in size. There is  moderate asymmetric left ventricular hypertrophy of the basal-septal segment. Left ventricular diastolic parameters are indeterminate. Right Ventricle: The right ventricular size is mildly enlarged. Right vetricular wall thickness  was not well  visualized. Right ventricular systolic function is normal. There is mildly elevated pulmonary artery systolic pressure. The tricuspid regurgitant  velocity is 3.19 m/s, and with an assumed right atrial pressure of 3 mmHg, the estimated right ventricular systolic pressure is 43.7 mmHg. Left Atrium: Left atrial size was moderately dilated. Right Atrium: Right atrial size was moderately dilated. Pericardium: There is no evidence of pericardial effusion. Mitral Valve: The mitral valve is degenerative in appearance. Severe mitral annular calcification. Trivial mitral valve regurgitation. Moderate mitral valve stenosis. Tricuspid Valve: The tricuspid valve is normal in structure. Tricuspid valve regurgitation is trivial. Aortic Valve: The aortic valve is tricuspid. Aortic valve regurgitation is not visualized. Aortic valve sclerosis/calcification is present, without any evidence of aortic stenosis. Aortic valve mean gradient measures 5.0 mmHg. Aortic valve peak gradient measures 10.6 mmHg. Aortic valve area, by VTI measures 2.56 cm. Pulmonic Valve: The pulmonic valve was not well visualized. Pulmonic valve regurgitation is not visualized. Aorta: The aortic root and ascending aorta are structurally normal, with no evidence of dilitation. IAS/Shunts: The interatrial septum was not well visualized.  LEFT VENTRICLE PLAX 2D LVIDd:         4.40 cm   Diastology LVIDs:         2.80 cm   LV e' lateral:   6.96 cm/s LV PW:         1.20 cm   LV E/e' lateral: 18.5 LV IVS:        1.70 cm LVOT diam:     2.10 cm LV SV:         89 LV SV Index:   47 LVOT Area:     3.46 cm  RIGHT VENTRICLE TAPSE (M-mode): 1.9 cm LEFT ATRIUM              Index        RIGHT ATRIUM           Index LA Vol (A2C):   100.0 ml 52.49 ml/m  RA Area:     27.30 cm LA Vol (A4C):   59.0 ml  30.97 ml/m  RA Volume:   88.30 ml  46.35 ml/m LA Biplane Vol: 82.3 ml  43.20 ml/m  AORTIC VALVE AV Area (Vmax):    2.83 cm AV Area (Vmean):   2.72 cm AV Area (VTI):     2.56  cm AV Vmax:           163.00 cm/s AV Vmean:          111.000 cm/s AV VTI:            0.346 m AV Peak Grad:      10.6 mmHg AV Mean Grad:      5.0 mmHg LVOT Vmax:         133.00 cm/s LVOT Vmean:        87.200 cm/s LVOT VTI:          0.256 m LVOT/AV VTI ratio: 0.74  AORTA Ao Root diam: 2.70 cm Ao Asc diam:  3.20 cm MITRAL VALVE                TRICUSPID VALVE MV Area (PHT): 2.41 cm     TR Peak grad:   40.7 mmHg MV Decel Time: 315 msec     TR Vmax:        319.00 cm/s MV E velocity: 129.00 cm/s MV A velocity: 62.40 cm/s   SHUNTS MV E/A ratio:  2.07  Systemic VTI:  0.26 m                             Systemic Diam: 2.10 cm Lonni Nanas MD Electronically signed by Lonni Nanas MD Signature Date/Time: 06/18/2024/2:18:17 PM    Final    DG Finger Index Left Result Date: 06/16/2024 CLINICAL DATA:  Left finger swelling. EXAM: LEFT INDEX FINGER 2+V COMPARISON:  None Available. FINDINGS: There is no evidence of fracture or dislocation. There is no evidence of arthropathy or other focal bone abnormality. Mild diffuse soft tissue swelling is noted. IMPRESSION: Mild diffuse soft tissue swelling.  No fracture or dislocation. Electronically Signed   By: Lynwood Landy Raddle M.D.   On: 06/16/2024 15:43   DG Hand Complete Right Result Date: 06/16/2024 CLINICAL DATA:  Right hand pain. EXAM: RIGHT HAND - COMPLETE 3+ VIEW COMPARISON:  None Available. FINDINGS: There is no evidence of fracture or dislocation. Severe degenerative changes seen involving first carpometacarpal joint. Soft tissues are unremarkable. IMPRESSION: Severe osteoarthritis of first carpometacarpal joint. No acute abnormality seen. Electronically Signed   By: Lynwood Landy Raddle M.D.   On: 06/16/2024 15:41   DG Chest 1 View Result Date: 06/16/2024 CLINICAL DATA:  Shortness of breath. EXAM: CHEST  1 VIEW COMPARISON:  September 26, 2023. FINDINGS: Mild cardiomegaly is noted. No acute pulmonary disease is noted. Bony thorax is unremarkable. IMPRESSION: No  active disease. Aortic Atherosclerosis (ICD10-I70.0). Electronically Signed   By: Lynwood Landy Raddle M.D.   On: 06/16/2024 15:40    Microbiology: Results for orders placed or performed during the hospital encounter of 07/01/24  Resp panel by RT-PCR (RSV, Flu A&B, Covid) Anterior Nasal Swab     Status: None   Collection Time: 07/01/24  1:04 PM   Specimen: Anterior Nasal Swab  Result Value Ref Range Status   SARS Coronavirus 2 by RT PCR NEGATIVE NEGATIVE Final   Influenza A by PCR NEGATIVE NEGATIVE Final   Influenza B by PCR NEGATIVE NEGATIVE Final    Comment: (NOTE) The Xpert Xpress SARS-CoV-2/FLU/RSV plus assay is intended as an aid in the diagnosis of influenza from Nasopharyngeal swab specimens and should not be used as a sole basis for treatment. Nasal washings and aspirates are unacceptable for Xpert Xpress SARS-CoV-2/FLU/RSV testing.  Fact Sheet for Patients: BloggerCourse.com  Fact Sheet for Healthcare Providers: SeriousBroker.it  This test is not yet approved or cleared by the [place]  FDA and has been authorized for detection and/or diagnosis of SARS-CoV-2 by FDA under an Emergency Use Authorization (EUA). This EUA will remain in effect (meaning this test can be used) for the duration of the COVID-19 declaration under Section 564(b)(1) of the Act, 21 U.S.C. section 360bbb-3(b)(1), unless the authorization is terminated or revoked.     Resp Syncytial Virus by PCR NEGATIVE NEGATIVE Final    Comment: (NOTE) Fact Sheet for Patients: BloggerCourse.com  Fact Sheet for Healthcare Providers: SeriousBroker.it  This test is not yet approved or cleared by the [place]  FDA and has been authorized for detection and/or diagnosis of SARS-CoV-2 by FDA under an Emergency Use Authorization (EUA). This EUA will remain in effect (meaning this test can be used) for the duration of  the COVID-19 declaration under Section 564(b)(1) of the Act, 21 U.S.C. section 360bbb-3(b)(1), unless the authorization is terminated or revoked.  Performed at [facility], [address] [place], [place] [address]     Labs: CBC: Recent Labs  Lab 07/01/24 1304 07/02/24 0242 07/03/24 0334 07/04/24 0805 07/05/24 0307  WBC 11.2* 9.2 17.7* 11.9* 11.2*  NEUTROABS 9.5*  --  16.3* 10.4* 9.9*  HGB 10.9* 10.2* 9.9* 11.1* 10.6*  HCT 35.1* 32.2* 30.6* 35.5* 33.5*  MCV 94.6 92.0 90.0 92.2 91.3  PLT 252 248 242 265 246   Basic Metabolic Panel: Recent Labs  Lab 07/01/24 1304 07/02/24 0242 07/03/24 0334 07/04/24 0805 07/05/24 0307  NA 139 135 135 139 136  K 3.3* 3.8 3.6 3.6 4.5  CL 98 95* 98 98 94*  CO2 27 25 25 27 28   GLUCOSE 159* 355* 137* 110* 141*  BUN 43* 47* 50* 52* 57*  CREATININE 1.81* 2.15* 2.02* 1.79* 2.20*  CALCIUM  6.6* 6.7* 6.9* 7.4* 7.5*  MG  --   --  2.0 2.0 2.0  PHOS  --   --  4.0 4.0 4.8*   Liver Function Tests: Recent Labs  Lab 07/01/24 1304 07/02/24 0242 07/03/24 0334 07/04/24 0805 07/05/24 0307  AST 17 21 15 17  13*  ALT 21 20 18 19 18   ALKPHOS 86 90 84 92 82  BILITOT 1.2 1.0 0.8 0.8 0.9  PROT 6.2* 6.0* 6.0* 6.3* 6.2*  ALBUMIN 2.9* 2.7* 2.8* 3.2* 2.9*   CBG: Recent Labs  Lab 07/04/24 0629 07/04/24 1119 07/04/24 1606 07/04/24 2104 07/05/24 0625  GLUCAP 133* 334* 121* 223* 124*    Discharge time spent: {LESS THAN/GREATER UYJW:73611} 30 minutes.  Signed: Alejandro Lazarus Marker, DO [facility] 07/05/2024

## 2024-07-05 NOTE — Plan of Care (Signed)
  Problem: Clinical Measurements: Goal: Respiratory complications will improve Outcome: Progressing   Problem: Activity: Goal: Risk for activity intolerance will decrease Outcome: Progressing   Problem: Clinical Measurements: Goal: Cardiovascular complication will be avoided Outcome: Progressing

## 2024-07-05 NOTE — Progress Notes (Signed)
  Progress Note  Patient Name: Jenny [name] Date of Encounter: 07/05/2024 [facility] Cardiologist: Lonni Cash, MD   Interval Summary   Feeling better, breathing better  Vital Signs Vitals:   07/05/24 0436 07/05/24 0628 07/05/24 0709 07/05/24 0910  BP: 137/85  (!) 147/112   Pulse: (!) 40  64   Resp: 16  20   Temp: 97.6 F (36.4 C)  97.8 F (36.6 C)   TempSrc: Oral  Oral   SpO2: 100%  96% 98%  Weight:  88.7 kg    Height:        Intake/Output Summary (Last 24 hours) at 07/05/2024 0930 Last data filed at 07/05/2024 0439 Gross per 24 hour  Intake 240 ml  Output 3300 ml  Net -3060 ml      07/05/2024    6:28 AM 07/04/2024    5:14 AM 07/03/2024   10:00 AM  Last 3 Weights  Weight (lbs) 195 lb 8 oz 198 lb 10.2 oz 201 lb 8 oz  Weight (kg) 88.678 kg 90.1 kg 91.4 kg      Telemetry/ECG  Bradycardic atrial fibrillation, during hospitalization brief runs of nonsustained ventricular tachycardia- Personally Reviewed  Physical Exam  GEN: No acute distress.  Sitting on edge of bed.  Appears fairly comfortable. Neck: No JVD Cardiac: Bradycardic, fairly regular, no murmurs, rubs, or gallops.  Respiratory: Poor air movement bilaterally.  Mild wheeze GI: Soft, nontender, non-distended  MS: No edema, much improved  Echo EF 65%, pulmonary pressures 44 mmHg, moderate mitral stenosis  Assessment & Plan   89 year old with right sided heart failure, diabetes chronic kidney disease stage IV tobacco use secondary pulmonary hypertension persistent atrial fibrillation with tachycardia-bradycardia managed without a pacemaker, coronary disease, diastolic heart failure admitted with acute hypoxic respiratory failure on 07/01/2024.  COPD exacerbation as well.  Acute on chronic hypoxic respiratory failure secondary to COPD Acute diastolic heart failure more consistent with right-sided heart failure in the setting of lung disease and secondary pulmonary hypertension Chronic kidney  disease stage V - Recurrent admissions.  Right heart catheterization was discussed but she does not wish to pursue.  I think this is very reasonable as well.  Salt restriction, fluid restriction at home. - Continue with Eliquis  dose adjusted for persistent atrial fibrillation - Received IV Lasix  40 mg twice a day-3.7 L out yesterday, creatinine 2.2 this morning fairly stable.  I think it is reasonable to transition over to p.o. Lasix .  Her home medication list said furosemide  40 mg once a day.  Lets go ahead and give her 40 mg twice a day given her frequent admissions. - Aggressive treatment of COPD - No AV nodal blocking agents given bradycardia.  No pacemaker.  Hopeful discharge today or tomorrow.  Discussed with her low-sodium at home.    For questions or updates, please contact [facility] Please consult [email or web address] for contact info under       Signed, Oneil Parchment, MD

## 2024-07-05 NOTE — Progress Notes (Signed)
 Reviewed AVS, patient expressed understanding of medications, MD follow up reviewed.   Removed IV, Site clean, dry and intact.  Patient states all belongings brought to the hospital at time of admission are accounted for and packed to take home.  Patient requested to finish lunch and will then be transported to Discharge lounge to wait for transportation home.

## 2024-07-05 NOTE — Progress Notes (Signed)
 Nurse requested Mobility Specialist to perform oxygen saturation test with pt which includes removing pt from oxygen both at rest and while ambulating.  Below are the results from that testing.     Patient Saturations on Room Air at Rest = spO2 99%  Patient Saturations on Room Air while Ambulating = sp02 85-86% .  Rested and performed pursed lip breathing for 1 minute with sp02 at 92-95%.  Patient Saturations on N/A Liters of oxygen while Ambulating = sp02 N/A%  At end of testing pt left in room on RA.   Reported results to nurse.

## 2024-07-05 NOTE — TOC Transition Note (Addendum)
 Transition of Care (TOC) - Discharge Note Rayfield Gobble RN, BSN Inpatient Care Management Unit 4E- RN Case Manager See Treatment Team for direct phone # [phone] Cross Coverage  Patient Details  Name: Jenny [name] MRN: [id number] Date of Birth: 11/29/1933  Transition of Care [facility]) CM/SW Contact:  Gobble Rayfield Hurst, RN Phone Number: 07/05/2024, 10:24 AM   Clinical Narrative:    Pt stable for transition home today, per previous CM note pt active with [facility] for [facility] needs. New Orders have been placed for resumption of services. [facility] liaison notified for resumption of HH.   No further ICM needs noted. RNCM will sign off for now as intervention is no longer needed. Please re-consult  if new needs arise, or contact RNCM assigned to treatment team for further questions/concerns.    1200- contacted via secure chat by MD that order for home 02 placed. Per ambulatory documentation by mobility pt was not placed on 02 and would not qualify for home 02 under insurance. MD notified and pt stable to discharge with no home 02 arranged at this time.    Final next level of care: Home w Home Health Services Barriers to Discharge: No Barriers Identified   Patient Goals and CMS Choice Patient states their goals for this hospitalization and ongoing recovery are:: return home   Choice offered to / list presented to : NA      Discharge Placement               Home w/ [facility]        Discharge Plan and Services Additional resources added to the After Visit Summary for   In-house Referral: NA Discharge Planning Services: CM Consult Post Acute Care Choice: Home Health, Resumption of Svcs/PTA Provider          DME Arranged: N/A DME Agency: NA       HH Arranged: PT, OT HH Agency: [facility] Date HH Agency Contacted: 07/05/24 Time HH Agency Contacted: 1023 Representative spoke with at [facility] Agency: shylise  Social Drivers of Health (SDOH) Interventions SDOH Screenings    Food Insecurity: No Food Insecurity (07/01/2024)  Housing: Low Risk  (07/01/2024)  Transportation Needs: No Transportation Needs (07/02/2024)  Utilities: Not At Risk (07/01/2024)  Alcohol Screen: Low Risk  (07/02/2024)  Depression (PHQ2-9): Low Risk  (07/22/2022)  Financial Resource Strain: Low Risk  (07/02/2024)  Social Connections: Moderately Isolated (07/01/2024)  Tobacco Use: Medium Risk (07/01/2024)     Readmission Risk Interventions    07/05/2024   10:24 AM  Readmission Risk Prevention Plan  Transportation Screening Complete  HRI or Home Care Consult Complete  Social Work Consult for Recovery Care Planning/Counseling Complete  Palliative Care Screening Not Applicable  Medication Review Oceanographer) Complete

## 2024-07-16 ENCOUNTER — Telehealth: Payer: Self-pay | Admitting: Family

## 2024-07-16 NOTE — Telephone Encounter (Signed)
 Called to confirm/remind patient of their appointment at the [facility] on 07/19/24.   Appointment:   [x] Confirmed  [] Left mess   [] No answer/No voice mail  [] VM Full/unable to leave message  [] Phone not in service  Patient reminded to bring all medications and/or complete list.  Confirmed patient has transportation. Gave directions, instructed to utilize valet parking.

## 2024-07-19 ENCOUNTER — Ambulatory Visit (HOSPITAL_BASED_OUTPATIENT_CLINIC_OR_DEPARTMENT_OTHER): Admitting: Family

## 2024-07-19 ENCOUNTER — Other Ambulatory Visit
Admission: RE | Admit: 2024-07-19 | Discharge: 2024-07-19 | Disposition: A | Discharge status: Home / Self Care | Source: Ambulatory Visit | Attending: Family | Admitting: Family

## 2024-07-19 ENCOUNTER — Ambulatory Visit: Payer: Self-pay | Admitting: Family

## 2024-07-19 ENCOUNTER — Encounter: Payer: Self-pay | Admitting: Family

## 2024-07-19 VITALS — BP 139/72 | HR 64 | Wt 193.0 lb

## 2024-07-19 DIAGNOSIS — I10 Essential (primary) hypertension: Secondary | ICD-10-CM | POA: Diagnosis not present

## 2024-07-19 DIAGNOSIS — I4819 Other persistent atrial fibrillation: Secondary | ICD-10-CM | POA: Diagnosis not present

## 2024-07-19 DIAGNOSIS — I5032 Chronic diastolic (congestive) heart failure: Secondary | ICD-10-CM | POA: Diagnosis not present

## 2024-07-19 DIAGNOSIS — J449 Chronic obstructive pulmonary disease, unspecified: Secondary | ICD-10-CM

## 2024-07-19 DIAGNOSIS — N184 Chronic kidney disease, stage 4 (severe): Secondary | ICD-10-CM

## 2024-07-19 LAB — BASIC METABOLIC PANEL WITH GFR
Anion gap: 16 — ABNORMAL HIGH (ref 5–15)
BUN: 31 mg/dL — ABNORMAL HIGH (ref 8–23)
CO2: 27 mmol/L (ref 22–32)
Calcium: 7.9 mg/dL — ABNORMAL LOW (ref 8.9–10.3)
Chloride: 97 mmol/L — ABNORMAL LOW (ref 98–111)
Creatinine, Ser: 1.85 mg/dL — ABNORMAL HIGH (ref 0.44–1.00)
GFR, Estimated: 26 mL/min — ABNORMAL LOW (ref >60)
Glucose, Bld: 129 mg/dL — ABNORMAL HIGH (ref 70–99)
Potassium: 4 mmol/L (ref 3.5–5.1)
Sodium: 140 mmol/L (ref 135–145)

## 2024-07-19 LAB — BRAIN NATRIURETIC PEPTIDE: B Natriuretic Peptide: 134.5 pg/mL — ABNORMAL HIGH (ref 0.0–100.0)

## 2024-07-19 NOTE — Progress Notes (Signed)
 [facility] Note    PCP: seeing [facility] every month Primary Cardiologist: Barbette Bruckner, MD (last seen 02/25)  Chief Complaint: shortness of breath   HPI:  Ms Liming is a 89 y/o female with a history of CAD, hyperlipidemia, HTN, pulmonary HTN, anemia, COPD, glaucoma, CKD, atrial fibrillation, tachy-brady syndrome, tobacco use, pHTN and chronic heart failure.   Admitted 09/26/23 with worsening shortness of breath. Has also noticed black colored stool. Hemoglobin of 9.3 with baseline above 13 but it was more than a year ago, renal function stable and at baseline. BNP elevated at 441. Chest x-ray with concern of pulmonary vascular congestion. IV lasix  given, cardiology consulted. Started on iron  supplement. EP consulted for possible pacemaker. IV iron  given.   Echo 08/22/21: EF of 60-65% along with mild LVH/ LAE, mild MR and severely elevated PA pressure of 65.1 mmHg.  Echo 04/09/23: EF 65-70% along with Grade II DD, severely elevated PA pressure, mild LAE, moderate RAE and mild/ moderate TR.    Stress test 12/2013  Admitted 06/16/24 with worsening SOB even after increasing oral diuretic. In ED, had no hypoxia, her BNP was elevated at 227, but this is less than her baseline most recently at 441, high-sensitivity troponins elevated at 28, labs were significant for hypokalemia 2.8, and creatinine elevated at 1.9 from baseline of 1.5, patient received IV steroids, nebulizer treatment with improvement of her symptoms. Treated for COPD exacerbation with steroids and neb treatments. Cardiology consulted for CHF, did not feel she was volume overloaded. Due to continued patient's symptoms of dyspnea with exertion, swelling, IV Lasix  was started 7/13 with clinical improvement, 3.4 L output. Echo 06/18/24: EF 60-65%, moderate LVH, normal RV, mildly elevated PA pressure of 43.7 mmHg, moderate biatrial enlargement, moderate MS.   Admitted 07/01/24 with worsening shortness of breath  and dyspnea. Had been eating fast food frequently. IV diuresed with transition to oral diuretics. Cardiology consulted. Troponin trend is flat and elevation is likely secondary to demand ischemia from pulmonary hypertension and volume overload. RHC was cancelled due to morbidities.   She presents today, with her son, for post hospital visit with a chief complaint of minimal shortness of breath. Has associated fatigue. Denies chest pain, palpitations, dizziness or edema. Says that she's trying to monitor her sodium/ fluid intake but admits that it's difficult. Son has a question about whether patient can drive or not.   Both patient and son say that she follows with nephrology group in [place] and was last seen ~ 1 month ago. They are unclear of when her next appointment is. Currently taking furosemide  40mg  BID which seems to be controlling her symptoms.   When asked about sodium she says that she's not eating out much anymore, rarely uses salt and keeps her fluid restriction to <2L / day. She says that she rinses her canned vegetables off before cooking.   ROS: All systems negative except as listed in HPI, PMH and Problem List.  SH:  Social History   Socioeconomic History   Marital status: Widowed    Spouse name: Not on file   Number of children: Not on file   Years of education: Not on file   Highest education level: Not on file  Occupational History   Not on file  Tobacco Use   Smoking status: Former    Current packs/day: 0.00    Average packs/day: 0.5 packs/day for 60.0 years (30.0 ttl pk-yrs)    Types: Cigarettes    Start date: 11/12/1961  Quit date: 11/12/2021    Years since quitting: 2.6   Smokeless tobacco: Never   Tobacco comments:    She lives in [place] with her significant other (Charles Hook)0  Vaping Use   Vaping status: Never Used  Substance and Sexual Activity   Alcohol use: Yes    Alcohol/week: 1.0 - 2.0 standard drink of alcohol    Types: 1 - 2 Glasses of wine per week     Comment: once or twice a year glass of wine 12/25/21   Drug use: No   Sexual activity: Not on file  Other Topics Concern   Not on file  Social History Narrative   Lives in [place] with her SO - charles hook   Retired -Acupuncturist   Enjoys travel - live Engineer, maintenance (IT)   Social Drivers of Corporate investment banker Strain: Low Risk  (07/02/2024)   Overall Financial Resource Strain (CARDIA)    Difficulty of Paying Living Expenses: Not very hard  Food Insecurity: No Food Insecurity (07/01/2024)   Hunger Vital Sign    Worried About Running Out of Food in the Last Year: Never true    Ran Out of Food in the Last Year: Never true  Transportation Needs: No Transportation Needs (07/02/2024)   PRAPARE - Administrator, Civil Service (Medical): No    Lack of Transportation (Non-Medical): No  Physical Activity: Not on file  Stress: Not on file  Social Connections: Moderately Isolated (07/01/2024)   Social Connection and Isolation Panel    Frequency of Communication with Friends and Family: More than three times a week    Frequency of Social Gatherings with Friends and Family: Three times a week    Attends Religious Services: 1 to 4 times per year    Active Member of Clubs or Organizations: No    Attends Banker Meetings: Never    Marital Status: Widowed  Intimate Partner Violence: Not At Risk (07/01/2024)   Humiliation, Afraid, Rape, and Kick questionnaire    Fear of Current or Ex-Partner: No    Emotionally Abused: No    Physically Abused: No    Sexually Abused: No    FH:  Family History  Problem Relation Age of Onset   Hypertension Mother    Ulcers Mother    Stomach cancer Father    Stroke Maternal Grandmother    Heart attack Neg Hx     Past Medical History:  Diagnosis Date   ACUT DUOD ULCER W/HEMORR&PERF W/O MENTION OBST 10/05/2009   NSAID induced   ALLERGIC RHINITIS CAUSE UNSPECIFIED    ANEMIA-NOS    CAD (coronary artery disease)  06/08/2009   DEs RCA with 70% LAD and EF 60%   CHF (congestive heart failure) (HCC)    COPD    mild obst on PFTs 03/2010   Diabetes mellitus 06/2010 dx   Mild, diet controlled   GLAUCOMA    HYPERLIPIDEMIA    HYPERTENSION, BENIGN    MYOCARDIAL INFARCTION 06/08/2009   des to rca   Persistent atrial fibrillation (HCC)    Dx 08/2021   TOBACCO ABUSE     Current Outpatient Medications  Medication Sig Dispense Refill   acetaminophen  (TYLENOL ) 500 MG tablet Take 500 mg by mouth every 6 (six) hours as needed (for knee, hip pain).     albuterol  (VENTOLIN  HFA) 108 (90 Base) MCG/ACT inhaler INHALE ONE PUFF EVERY 6 HOURS AS NEEDED FOR SHORTNESS OF BREATH 18 g 2   apixaban  (ELIQUIS )  2.5 MG TABS tablet Take 1 tablet (2.5 mg total) by mouth 2 (two) times daily. 180 tablet 3   dorzolamide  (TRUSOPT ) 2 % ophthalmic solution Place 1 drop into both eyes 2 (two) times daily.     furosemide  (LASIX ) 40 MG tablet Take 1 tablet (40 mg total) by mouth 2 (two) times daily. 60 tablet 0   gabapentin  (NEURONTIN ) 100 MG capsule Take 100-200 mg by mouth See admin instructions. Take 1 capsule (100mg ) by mouth every morning and take 2 capsules (200mg ) by mouth at bedtime     Glycopyrrolate -Formoterol  (BEVESPI  AEROSPHERE) 9-4.8 MCG/ACT AERO Inhale 2 puffs into the lungs 2 (two) times daily. (Patient taking differently: Inhale 2 puffs into the lungs 2 (two) times daily as needed (for COPD).) 10.7 g 1   hydrOXYzine (ATARAX) 25 MG tablet Take 25 mg by mouth every 8 (eight) hours as needed for itching.     ipratropium-albuterol  (DUONEB) 0.5-2.5 (3) MG/3ML SOLN Take 3 mLs by nebulization every 6 (six) hours as needed. (Patient taking differently: Take 3 mLs by nebulization every 6 (six) hours as needed (shortness of breath).) 360 mL 1   latanoprost  (XALATAN ) 0.005 % ophthalmic solution Place 1 drop into both eyes in the morning.     metolazone  (ZAROXOLYN ) 2.5 MG tablet Take 1 tablet (2.5 mg total) by mouth as needed. (Patient  taking differently: Take 2.5 mg by mouth as needed (edema).) 20 tablet 0   NITROSTAT  0.4 MG SL tablet DISSOLVE ONE TABLET UNDER TONGUE AS NEEDED FOR CHEST PAIN - MAY REPEAT TWICE-IF NO RELIEF GO TO NEAREST HOSPITAL ER (Patient taking differently: Place 0.4 mg under the tongue every 5 (five) minutes as needed for chest pain.) 25 tablet 0   nystatin  (MYCOSTATIN /NYSTOP ) powder Apply topically 3 (three) times daily. 15 g 0   pantoprazole  (PROTONIX ) 40 MG tablet Take 1 tablet (40 mg total) by mouth daily. 90 tablet 3   rosuvastatin  (CRESTOR ) 20 MG tablet Take 20 mg by mouth daily.     Tiotropium Bromide-Olodaterol (STIOLTO RESPIMAT) 2.5-2.5 MCG/ACT AERS Inhale 2 puffs into the lungs 2 (two) times daily.     No current facility-administered medications for this visit.   Vitals:   07/19/24 1148  BP: 139/72  Pulse: 64  SpO2: 95%  Weight: 193 lb (87.5 kg)   Wt Readings from Last 3 Encounters:  07/19/24 193 lb (87.5 kg)  07/05/24 195 lb 8 oz (88.7 kg)  06/17/24 198 lb 6.4 oz (90 kg)   Lab Results  Component Value Date   CREATININE 2.20 (H) 07/05/2024   CREATININE 1.79 (H) 07/04/2024   CREATININE 2.02 (H) 07/03/2024    PHYSICAL EXAM:  General: Elderly appearing female Cor: No JVD. Regular rhythm, rate.  Lungs: clear Abdomen: soft, nontender, nondistended. Extremities: no edema Neuro:. Affect pleasant   ECG: not done   ASSESSMENT & PLAN:  1: NICM with preserved ejection fraction- - likely due to AF, pHTN - NYHA class II/ III - euvolemic today - weight down 2 pounds from last visit here 5 months ago - Echo 08/22/21: EF of 60-65% along with mild LVH/ LAE, mild MR and severely elevated PA pressure of 65.1 mmHg. - Echo 04/09/23: EF 65-70% along with Grade II DD, severely elevated PA pressure, mild LAE, moderate RAE and mild/ moderate TR.  - Echo 06/18/24: EF 60-65%, moderate LVH, normal RV, mildly elevated PA pressure of 43.7 mmHg, moderate biatrial enlargement, moderate MS.  - not  adding salt to her food and tries to eat  low sodium foods  - continue furosemide  40mg  BID  - continue metolazone  2.5mg  PRN when directed - SOB worsened when taking entresto  - not interested in spironolactone - continues to have issues with yeast in groin/ under breasts so not a good candidate for SGLT2 - saw cardiology [place]) 02/25 - she denies eating out much and is not adding salt to her food. Discrepancy in what she says about her diet vs what home health is saying. Reviewed the importance of limiting eating out due to high sodium content.  - RHC was cancelled during admission due to improvement in symptoms as well as comorbidities - instructed patient that she should not drive due to her shortness of breath and gabapentin  that she's currently taking. Family member brought patient to appointment today - BNP 07/01/24 reviewed and was 257.5  2: HTN- - BP 139/72 - seeing [facility] for primary care every month - home health nurse comes every few weeks   3: Persistent atrial fibrillation- - saw EP Robyne) 04/23 - continue apixaban  2.5mg  BID - had cardioversion 01/31/22 & post cardioversion, developed Mobitz 2nd degree AV block  4: COPD- - stopped smoking fall 2022 - using trelegy daily and nebulizer PRN - saw pulmonology Burnie) 03/24 - continue stiolto respimat daily - PFTs: 03/03/23 - FEV1 - 58%   5: CKD stage IV- - BMP 07/05/24 reviewed: sodium 136, potassium 4.5, creatinine 2.2 and GFR 21 - saw nephrology ([place]) ~ 1 month ago but they have to check and see when her next appointment is - briefly discussed dialysis if indicated. She may not be a good candidate for either HD or PD - BMET today   Return in 3 months, sooner if needed.   Ellouise DELENA Class, FNP 07/19/24

## 2024-07-19 NOTE — Patient Instructions (Addendum)
 Keep daily sodium intake 1500-2000mg    Keep fluid intake to no more than 2L daily.   Medication Changes:  No medication changes today!  Lab Work:  Go over to the MEDICAL MALL. Go pass the gift shop and have your blood work completed.  We will only call you if the results are abnormal or if the provider would like to make medication changes.  No news is good news.   Follow-Up in: Please follow up with the [facility] in 3 months with Ellouise Class, FNP.   Thank you for choosing [facility].    At the [facility], you and your health needs are our priority. We have a designated team specialized in the treatment of Heart Failure. This Care Team includes your primary Heart Failure Specialized Cardiologist (physician), Advanced Practice Providers (APPs- Physician Assistants and Nurse Practitioners), and Pharmacist who all work together to provide you with the care you need, when you need it.   You may see any of the following providers on your designated Care Team at your next follow up:  Dr. Toribio Fuel Dr. Ezra Shuck Dr. Ria Commander Dr. Morene Brownie Ellouise Class, FNP Jaun Bash, RPH-CPP  Please be sure to bring in all your medications bottles to every appointment.   Need to Contact Us :  If you have any questions or concerns before your next appointment please send us  a message through [place] or call our office at [phone].    TO LEAVE A MESSAGE FOR THE NURSE SELECT OPTION 2, PLEASE LEAVE A MESSAGE INCLUDING: YOUR NAME DATE OF BIRTH CALL BACK NUMBER REASON FOR CALL**this is important as we prioritize the call backs  YOU WILL RECEIVE A CALL BACK THE SAME DAY AS LONG AS YOU CALL BEFORE 4:00 PM

## 2024-07-19 NOTE — Patient Instructions (Addendum)
 Keep daily sodium intake 1500-2000mg    Keep fluid intake to no more than 2L daily.   Medication Changes:  No medication changes today!  Lab Work:  Go over to the MEDICAL MALL. Go pass the gift shop and have your blood work completed.  We will only call you if the results are abnormal or if the provider would like to make medication changes.  No news is good news.   Follow-Up in: Please follow up with the [facility] in 3 months with Ellouise Class, FNP.   Thank you for choosing [facility].    At the [facility], you and your health needs are our priority. We have a designated team specialized in the treatment of Heart Failure. This Care Team includes your primary Heart Failure Specialized Cardiologist (physician), Advanced Practice Providers (APPs- Physician Assistants and Nurse Practitioners), and Pharmacist who all work together to provide you with the care you need, when you need it.   You may see any of the following providers on your designated Care Team at your next follow up:  Dr. Toribio Fuel Dr. Ezra Shuck Dr. Ria Commander Dr. Morene Brownie Ellouise Class, FNP Jaun Bash, RPH-CPP  Please be sure to bring in all your medications bottles to every appointment.   Need to Contact Us :  If you have any questions or concerns before your next appointment please send us  a message through [place] or call our office at [phone].    TO LEAVE A MESSAGE FOR THE NURSE SELECT OPTION 2, PLEASE LEAVE A MESSAGE INCLUDING: YOUR NAME DATE OF BIRTH CALL BACK NUMBER REASON FOR CALL**this is important as we prioritize the call backs  YOU WILL RECEIVE A CALL BACK THE SAME DAY AS LONG AS YOU CALL BEFORE 4:00 PM

## 2024-08-06 ENCOUNTER — Telehealth: Payer: Self-pay | Admitting: Family

## 2024-08-06 LAB — BASIC METABOLIC PANEL WITH GFR: EGFR: 21

## 2024-08-06 NOTE — Telephone Encounter (Signed)
 She had taken extra diuretic ~ 1 week ago due to weight gain and worsening symptoms. Back on current regimen.   Lab results from home health dated 08/05/24:  Sodium 142 Potassium 3.7 Creatinine 2.17 eGFR 21 proBNP 4035

## 2024-08-23 ENCOUNTER — Telehealth: Payer: Self-pay | Admitting: Family

## 2024-08-23 NOTE — Telephone Encounter (Signed)
 Received text messages from A. Holli, RN from home health agency stating that the patient has been more SOB for a few days even after taking a dose of metolazone . She's currently on furosemide  and feels like she may want to go back on torsemide  as she doesn't think the furosemide  is working as well anymore. Nephrology has mentioned dialysis but patient says that she wouldn't consider it.   Once RN got to patient's home, she found 2 empty boxes of pizza in the home. HR 52 (IR), BP 140/70, weight 185.5 pounds, RR 28 with pursed lip breathing, O2 sats on room air 98-99%, diminished lungs in bases.   Explained that she should go to the ER due to tachypnea and pursed lip breathing. Patient refuses going to the ER unless she worsens as she has things to do with the cats today. Take an additional metolazone  2.5mg  today, BMET, BNP, CBC (has been anemic in the past).   [facility] did a virtual visit while RN was there and they are sending in prednisone . [facility] from [facility] will see patient in the home tomorrow. They are going to discuss Hospice services along with their home CHF program.

## 2024-08-24 ENCOUNTER — Telehealth: Payer: Self-pay | Admitting: Family

## 2024-08-24 LAB — LAB REPORT - SCANNED: EGFR: 23

## 2024-08-24 MED ORDER — POTASSIUM CHLORIDE CRYS ER 20 MEQ PO TBCR: EXTENDED_RELEASE_TABLET | ORAL | 3 refills | Status: DC

## 2024-08-24 NOTE — Telephone Encounter (Signed)
 Received labs from home health agency dated 08/23/24:  Hg 12.7 Creatinine 2.05 (better) eGFR 23 Potassium 2.7 Sodium 131 proBNP 2637 (better)  Advised A. Holli, RN from [facility] advised to have patient take 40meq potassium BID X 2 days then decrease it to 20meq daily. BMET next week. [facility] to send LPN to patient's home today as well and is going to discuss home HF program and their hospice services.

## 2024-08-31 LAB — BASIC METABOLIC PANEL WITH GFR: EGFR: 34

## 2024-09-02 ENCOUNTER — Emergency Department (HOSPITAL_COMMUNITY)

## 2024-09-02 ENCOUNTER — Inpatient Hospital Stay (HOSPITAL_COMMUNITY)
Admission: EM | Admit: 2024-09-02 | Discharge: 2024-09-04 | DRG: 189 | Disposition: A | Discharge status: Home Health | Attending: Internal Medicine | Admitting: Internal Medicine

## 2024-09-02 DIAGNOSIS — E1122 Type 2 diabetes mellitus with diabetic chronic kidney disease: Secondary | ICD-10-CM | POA: Diagnosis present

## 2024-09-02 DIAGNOSIS — I4819 Other persistent atrial fibrillation: Secondary | ICD-10-CM | POA: Diagnosis present

## 2024-09-02 DIAGNOSIS — K219 Gastro-esophageal reflux disease without esophagitis: Secondary | ICD-10-CM | POA: Diagnosis present

## 2024-09-02 DIAGNOSIS — I5032 Chronic diastolic (congestive) heart failure: Secondary | ICD-10-CM | POA: Diagnosis present

## 2024-09-02 DIAGNOSIS — G8929 Other chronic pain: Secondary | ICD-10-CM | POA: Diagnosis present

## 2024-09-02 DIAGNOSIS — Z888 Allergy status to other drugs, medicaments and biological substances status: Secondary | ICD-10-CM

## 2024-09-02 DIAGNOSIS — Z8679 Personal history of other diseases of the circulatory system: Secondary | ICD-10-CM | POA: Diagnosis not present

## 2024-09-02 DIAGNOSIS — D649 Anemia, unspecified: Secondary | ICD-10-CM | POA: Diagnosis present

## 2024-09-02 DIAGNOSIS — E1165 Type 2 diabetes mellitus with hyperglycemia: Secondary | ICD-10-CM | POA: Diagnosis present

## 2024-09-02 DIAGNOSIS — Z66 Do not resuscitate: Secondary | ICD-10-CM | POA: Diagnosis present

## 2024-09-02 DIAGNOSIS — R0603 Acute respiratory distress: Secondary | ICD-10-CM | POA: Diagnosis present

## 2024-09-02 DIAGNOSIS — N184 Chronic kidney disease, stage 4 (severe): Secondary | ICD-10-CM

## 2024-09-02 DIAGNOSIS — E1140 Type 2 diabetes mellitus with diabetic neuropathy, unspecified: Secondary | ICD-10-CM | POA: Diagnosis present

## 2024-09-02 DIAGNOSIS — E871 Hypo-osmolality and hyponatremia: Secondary | ICD-10-CM | POA: Diagnosis present

## 2024-09-02 DIAGNOSIS — J441 Chronic obstructive pulmonary disease with (acute) exacerbation: Secondary | ICD-10-CM | POA: Diagnosis present

## 2024-09-02 DIAGNOSIS — E785 Hyperlipidemia, unspecified: Secondary | ICD-10-CM | POA: Diagnosis present

## 2024-09-02 DIAGNOSIS — Z79899 Other long term (current) drug therapy: Secondary | ICD-10-CM

## 2024-09-02 DIAGNOSIS — J9600 Acute respiratory failure, unspecified whether with hypoxia or hypercapnia: Secondary | ICD-10-CM | POA: Diagnosis present

## 2024-09-02 DIAGNOSIS — E876 Hypokalemia: Secondary | ICD-10-CM | POA: Diagnosis present

## 2024-09-02 DIAGNOSIS — Z8249 Family history of ischemic heart disease and other diseases of the circulatory system: Secondary | ICD-10-CM | POA: Diagnosis not present

## 2024-09-02 DIAGNOSIS — Z7901 Long term (current) use of anticoagulants: Secondary | ICD-10-CM

## 2024-09-02 DIAGNOSIS — Z886 Allergy status to analgesic agent status: Secondary | ICD-10-CM

## 2024-09-02 DIAGNOSIS — Z823 Family history of stroke: Secondary | ICD-10-CM

## 2024-09-02 DIAGNOSIS — I252 Old myocardial infarction: Secondary | ICD-10-CM

## 2024-09-02 DIAGNOSIS — I5033 Acute on chronic diastolic (congestive) heart failure: Secondary | ICD-10-CM

## 2024-09-02 DIAGNOSIS — E66812 Obesity, class 2: Secondary | ICD-10-CM | POA: Diagnosis present

## 2024-09-02 DIAGNOSIS — Z7951 Long term (current) use of inhaled steroids: Secondary | ICD-10-CM | POA: Diagnosis not present

## 2024-09-02 DIAGNOSIS — I130 Hypertensive heart and chronic kidney disease with heart failure and stage 1 through stage 4 chronic kidney disease, or unspecified chronic kidney disease: Secondary | ICD-10-CM | POA: Diagnosis present

## 2024-09-02 DIAGNOSIS — Z885 Allergy status to narcotic agent status: Secondary | ICD-10-CM

## 2024-09-02 DIAGNOSIS — Z87891 Personal history of nicotine dependence: Secondary | ICD-10-CM

## 2024-09-02 DIAGNOSIS — D72829 Elevated white blood cell count, unspecified: Secondary | ICD-10-CM | POA: Diagnosis present

## 2024-09-02 DIAGNOSIS — I2510 Atherosclerotic heart disease of native coronary artery without angina pectoris: Secondary | ICD-10-CM | POA: Diagnosis present

## 2024-09-02 DIAGNOSIS — Z91048 Other nonmedicinal substance allergy status: Secondary | ICD-10-CM

## 2024-09-02 DIAGNOSIS — Z800 Family history of malignant neoplasm of digestive organs: Secondary | ICD-10-CM

## 2024-09-02 DIAGNOSIS — Z91199 Patient's noncompliance with other medical treatment and regimen due to unspecified reason: Secondary | ICD-10-CM

## 2024-09-02 DIAGNOSIS — E739 Lactose intolerance, unspecified: Secondary | ICD-10-CM | POA: Diagnosis present

## 2024-09-02 LAB — CBC WITH DIFFERENTIAL/PLATELET
Abs Immature Granulocytes: 0.12 K/uL — ABNORMAL HIGH (ref 0.00–0.07)
Basophils Absolute: 0.1 K/uL (ref 0.0–0.1)
Basophils Relative: 1 %
Eosinophils Absolute: 0.1 K/uL (ref 0.0–0.5)
Eosinophils Relative: 1 %
HCT: 32.9 % — ABNORMAL LOW (ref 36.0–46.0)
Hemoglobin: 10.1 g/dL — ABNORMAL LOW (ref 12.0–15.0)
Immature Granulocytes: 1 %
Lymphocytes Relative: 8 %
Lymphs Abs: 0.9 K/uL (ref 0.7–4.0)
MCH: 27.3 pg (ref 26.0–34.0)
MCHC: 30.7 g/dL (ref 30.0–36.0)
MCV: 88.9 fL (ref 80.0–100.0)
Monocytes Absolute: 0.8 K/uL (ref 0.1–1.0)
Monocytes Relative: 7 %
Neutro Abs: 9.2 K/uL — ABNORMAL HIGH (ref 1.7–7.7)
Neutrophils Relative %: 82 %
Platelets: 341 K/uL (ref 150–400)
RBC: 3.7 MIL/uL — ABNORMAL LOW (ref 3.87–5.11)
RDW: 16.8 % — ABNORMAL HIGH (ref 11.5–15.5)
WBC: 11.1 K/uL — ABNORMAL HIGH (ref 4.0–10.5)
nRBC: 0 % (ref 0.0–0.2)

## 2024-09-02 LAB — COMPREHENSIVE METABOLIC PANEL WITH GFR
ALT: 11 U/L (ref 0–44)
AST: 16 U/L (ref 15–41)
Albumin: 2.6 g/dL — ABNORMAL LOW (ref 3.5–5.0)
Alkaline Phosphatase: 76 U/L (ref 38–126)
Anion gap: 14 (ref 5–15)
BUN: 34 mg/dL — ABNORMAL HIGH (ref 8–23)
CO2: 23 mmol/L (ref 22–32)
Calcium: 6.7 mg/dL — ABNORMAL LOW (ref 8.9–10.3)
Chloride: 96 mmol/L — ABNORMAL LOW (ref 98–111)
Creatinine, Ser: 1.88 mg/dL — ABNORMAL HIGH (ref 0.44–1.00)
GFR, Estimated: 25 mL/min — ABNORMAL LOW (ref >60)
Glucose, Bld: 200 mg/dL — ABNORMAL HIGH (ref 70–99)
Potassium: 3.2 mmol/L — ABNORMAL LOW (ref 3.5–5.1)
Sodium: 133 mmol/L — ABNORMAL LOW (ref 135–145)
Total Bilirubin: 1.2 mg/dL (ref 0.0–1.2)
Total Protein: 6 g/dL — ABNORMAL LOW (ref 6.5–8.1)

## 2024-09-02 LAB — GLUCOSE, CAPILLARY: Glucose-Capillary: 334 mg/dL — ABNORMAL HIGH (ref 70–99)

## 2024-09-02 LAB — BRAIN NATRIURETIC PEPTIDE: B Natriuretic Peptide: 236.8 pg/mL — ABNORMAL HIGH (ref 0.0–100.0)

## 2024-09-02 MED ORDER — ALBUTEROL SULFATE (2.5 MG/3ML) 0.083% IN NEBU
2.5000 mg | INHALATION_SOLUTION | Freq: Once | RESPIRATORY_TRACT | Status: AC
  Administered 2024-09-02: 2.5 mg via RESPIRATORY_TRACT

## 2024-09-02 MED ORDER — POTASSIUM CHLORIDE 10 MEQ/100ML IV SOLN
10.0000 meq | INTRAVENOUS | Status: AC
Start: 2024-09-02 — End: 2024-09-03
  Administered 2024-09-02 – 2024-09-03 (×3): 10 meq via INTRAVENOUS
  Filled 2024-09-02 (×3): qty 100

## 2024-09-02 MED ORDER — INSULIN ASPART 100 UNIT/ML IJ SOLN
0.0000 [IU] | Freq: Three times a day (TID) | INTRAMUSCULAR | Status: DC
  Administered 2024-09-03: 5 [IU] via SUBCUTANEOUS

## 2024-09-02 MED ORDER — SODIUM CHLORIDE 0.9 % IV SOLN
500.0000 mg | INTRAVENOUS | Status: AC
  Administered 2024-09-03: 500 mg via INTRAVENOUS
  Filled 2024-09-02: qty 5

## 2024-09-02 MED ORDER — ACETAMINOPHEN 325 MG PO TABS: 650.0000 mg | ORAL_TABLET | Freq: Four times a day (QID) | ORAL | Status: DC | PRN

## 2024-09-02 MED ORDER — GABAPENTIN 100 MG PO CAPS: 100.0000 mg | ORAL_CAPSULE | ORAL | Status: DC

## 2024-09-02 MED ORDER — ACETAMINOPHEN 650 MG RE SUPP: 650.0000 mg | Freq: Four times a day (QID) | RECTAL | Status: DC | PRN

## 2024-09-02 MED ORDER — CALCIUM GLUCONATE-NACL 1-0.675 GM/50ML-% IV SOLN
1.0000 g | Freq: Once | INTRAVENOUS | Status: AC
  Administered 2024-09-03: 1000 mg via INTRAVENOUS
  Filled 2024-09-02: qty 50

## 2024-09-02 MED ORDER — DORZOLAMIDE HCL 2 % OP SOLN
1.0000 [drp] | Freq: Two times a day (BID) | OPHTHALMIC | Status: DC
  Administered 2024-09-02 – 2024-09-04 (×4): 1 [drp] via OPHTHALMIC
  Filled 2024-09-02: qty 10

## 2024-09-02 MED ORDER — IPRATROPIUM-ALBUTEROL 0.5-2.5 (3) MG/3ML IN SOLN
3.0000 mL | Freq: Four times a day (QID) | RESPIRATORY_TRACT | Status: DC
  Administered 2024-09-02 – 2024-09-04 (×7): 3 mL via RESPIRATORY_TRACT
  Filled 2024-09-02 (×8): qty 3

## 2024-09-02 MED ORDER — BUDESONIDE 0.25 MG/2ML IN SUSP
0.2500 mg | Freq: Two times a day (BID) | RESPIRATORY_TRACT | Status: DC
  Administered 2024-09-02 – 2024-09-03 (×2): 0.25 mg via RESPIRATORY_TRACT
  Filled 2024-09-02 (×2): qty 2

## 2024-09-02 MED ORDER — APIXABAN 2.5 MG PO TABS
2.5000 mg | ORAL_TABLET | Freq: Two times a day (BID) | ORAL | Status: DC
  Administered 2024-09-02 – 2024-09-04 (×4): 2.5 mg via ORAL
  Filled 2024-09-02 (×4): qty 1

## 2024-09-02 MED ORDER — FUROSEMIDE 10 MG/ML IJ SOLN
20.0000 mg | Freq: Once | INTRAMUSCULAR | Status: AC
  Administered 2024-09-02: 20 mg via INTRAVENOUS
  Filled 2024-09-02: qty 2

## 2024-09-02 MED ORDER — METHYLPREDNISOLONE SODIUM SUCC 40 MG IJ SOLR: 40.0000 mg | Freq: Two times a day (BID) | INTRAMUSCULAR | Status: DC

## 2024-09-02 MED ORDER — GABAPENTIN 100 MG PO CAPS
100.0000 mg | ORAL_CAPSULE | Freq: Every morning | ORAL | Status: DC
Start: 2024-09-03 — End: 2024-09-04
  Administered 2024-09-03 – 2024-09-04 (×2): 100 mg via ORAL
  Filled 2024-09-02 (×2): qty 1

## 2024-09-02 MED ORDER — SODIUM CHLORIDE 0.9 % IV SOLN
500.0000 mg | Freq: Once | INTRAVENOUS | Status: AC
  Administered 2024-09-02: 500 mg via INTRAVENOUS

## 2024-09-02 MED ORDER — GABAPENTIN 100 MG PO CAPS
200.0000 mg | ORAL_CAPSULE | Freq: Every day | ORAL | Status: DC
  Administered 2024-09-02 – 2024-09-03 (×2): 200 mg via ORAL
  Filled 2024-09-02 (×2): qty 2

## 2024-09-02 MED ORDER — DEXAMETHASONE SODIUM PHOSPHATE 10 MG/ML IJ SOLN
10.0000 mg | Freq: Once | INTRAMUSCULAR | Status: AC
  Administered 2024-09-02: 10 mg via INTRAVENOUS
  Filled 2024-09-02: qty 1

## 2024-09-02 MED ORDER — FUROSEMIDE 20 MG PO TABS
40.0000 mg | ORAL_TABLET | Freq: Two times a day (BID) | ORAL | Status: DC
  Administered 2024-09-03 – 2024-09-04 (×3): 40 mg via ORAL
  Filled 2024-09-02 (×3): qty 2

## 2024-09-02 MED ORDER — PANTOPRAZOLE SODIUM 40 MG PO TBEC
40.0000 mg | DELAYED_RELEASE_TABLET | Freq: Every day | ORAL | Status: DC
  Administered 2024-09-03 – 2024-09-04 (×2): 40 mg via ORAL
  Filled 2024-09-02 (×3): qty 1

## 2024-09-02 MED ORDER — ALBUTEROL SULFATE (2.5 MG/3ML) 0.083% IN NEBU
2.5000 mg | INHALATION_SOLUTION | RESPIRATORY_TRACT | Status: DC | PRN
  Administered 2024-09-04: 2.5 mg via RESPIRATORY_TRACT
  Filled 2024-09-02: qty 3

## 2024-09-02 MED ORDER — INSULIN ASPART 100 UNIT/ML IJ SOLN
0.0000 [IU] | Freq: Every day | INTRAMUSCULAR | Status: DC
  Administered 2024-09-02: 4 [IU] via SUBCUTANEOUS
  Administered 2024-09-03: 2 [IU] via SUBCUTANEOUS

## 2024-09-02 MED ORDER — LATANOPROST 0.005 % OP SOLN
1.0000 [drp] | Freq: Every morning | OPHTHALMIC | Status: DC
  Administered 2024-09-03 – 2024-09-04 (×2): 1 [drp] via OPHTHALMIC
  Filled 2024-09-02: qty 2.5

## 2024-09-02 MED ORDER — ROSUVASTATIN CALCIUM 20 MG PO TABS
20.0000 mg | ORAL_TABLET | Freq: Every day | ORAL | Status: DC
  Administered 2024-09-03 – 2024-09-04 (×2): 20 mg via ORAL
  Filled 2024-09-02 (×2): qty 1

## 2024-09-02 NOTE — ED Notes (Signed)
 CCMD called.

## 2024-09-02 NOTE — ED Notes (Signed)
 PT was placed back on BIPAP per provider order and Meds held due to PT being on bipap and has to be off for 30-60 mins per RT and protocol.

## 2024-09-02 NOTE — H&P (Signed)
 History and Physical    Jenny [name] FMW:[id number] DOB: 10/16/1933 DOA: 09/02/2024  PCP: Ileen Rosaline NOVAK, NP  Patient coming from: Home  Chief Complaint: Shortness of breath  HPI: Jenny [name] is a 89 y.o. female with medical history significant of HFpEF, hypertension, persistent A-fib on Eliquis , tachybradycardia syndrome, hyperlipidemia, CAD, COPD, CKD stage IV, type 2 diabetes, anemia, GERD, class II obesity presents to the ED via EMS for evaluation of shortness of breath and rhonchi/wheezing.  She was placed on CPAP by EMS and was given Solu-Medrol  125 mg, IV mag 2 g, and DuoNebs prior to arrival.  On arrival to the ED, patient was placed on BiPAP due to respiratory distress.  Afebrile.  Labs notable for WBC count 11.1, hemoglobin 10.1 (stable), sodium 133, potassium 3.2, chloride 96, bicarb 23, glucose 200, BUN 34, creatinine 1.8 (stable), calcium  6.7, albumin 2.6, BNP 236.  Chest x-ray showing no acute abnormalities.  Patient was given albuterol  neb, IV Decadron  10 mg, IV Lasix  20 mg, and azithromycin  in the ED.  Eventually taken off BiPAP and [facility] called to admit.  Patient is reporting 2-day history of shortness of breath and cough.  She is not on home oxygen.  She has been using her home inhaler Stiolto but ran out of her rescue inhaler albuterol .  She has a nebulizer machine at home but has not used DuoNeb.  She is taking Lasix  40 mg twice daily.  Reports gaining weight a few weeks ago when she was on prednisone .  Denies fevers or chest pain.  No other complaints.  Review of Systems:  Review of Systems  All other systems reviewed and are negative.   Past Medical History:  Diagnosis Date   ACUT DUOD ULCER W/HEMORR&PERF W/O MENTION OBST 10/05/2009   NSAID induced   ALLERGIC RHINITIS CAUSE UNSPECIFIED    ANEMIA-NOS    CAD (coronary artery disease) 06/08/2009   DEs RCA with 70% LAD and EF 60%   CHF (congestive heart failure) (HCC)    COPD    mild obst on PFTs 03/2010    Diabetes mellitus 06/2010 dx   Mild, diet controlled   GLAUCOMA    HYPERLIPIDEMIA    HYPERTENSION, BENIGN    MYOCARDIAL INFARCTION 06/08/2009   des to rca   Persistent atrial fibrillation (HCC)    Dx 08/2021   TOBACCO ABUSE     Past Surgical History:  Procedure Laterality Date   CARDIOVERSION N/A 01/30/2022   Procedure: CARDIOVERSION;  Surgeon: Alveta Aleene [name], MD;  Location: [facility] ENDOSCOPY;  Service: Cardiovascular;  Laterality: N/A;   HEMORRHOID SURGERY  1990   Right knee surgery       reports that she quit smoking about 2 years ago. Her smoking use included cigarettes. She started smoking about 62 years ago. She has a 30 pack-year smoking history. She has never used smokeless tobacco. She reports current alcohol use of about 1.0 - 2.0 standard drink of alcohol per week. She reports that she does not use drugs.  Allergies  Allergen Reactions   Aspirin  Other (See Comments)    Can take 81 mg not 325 mg -bleeding   Entresto  [Sacubitril -Valsartan ] Shortness Of Breath    COPD and reports increased SOB with Entresto    Atorvastatin  Itching    Itching and myaliga  Other Reaction(s): itching, rash, Other (See Comments)   Cyclobenzaprine     Other reaction(s): muscle relaxers-ulcer hemorrhage (hospitalized)  Other Reaction(s): Other (See Comments), ulcer hemorrhage   Lactose Intolerance (Gi) Other (  See Comments)    GI upset   Meperidine Hcl Other (See Comments)    Not known   Pravastatin  Rash    rash  Other Reaction(s): Other (See Comments)   Propoxyphene     Other reaction(s): pain meds, hallucination, vomiting  Other Reaction(s): Hallucination, hallucinations, vomiting, Other (See Comments), Unknown   Wound Dressing Adhesive     Other reaction(s): red, stings    Family History  Problem Relation Age of Onset   Hypertension Mother    Ulcers Mother    Stomach cancer Father    Stroke Maternal Grandmother    Heart attack Neg Hx     Prior to Admission medications    Medication Sig Start Date End Date Taking? Authorizing Provider  acetaminophen  (TYLENOL ) 500 MG tablet Take 500 mg by mouth every 6 (six) hours as needed (for knee, hip pain).    [provider]  albuterol  (VENTOLIN  HFA) 108 (90 Base) MCG/ACT inhaler INHALE ONE PUFF EVERY 6 HOURS AS NEEDED FOR SHORTNESS OF BREATH 11/03/21   Claudene Arthea SQUIBB, MD  apixaban  (ELIQUIS ) 2.5 MG TABS tablet Take 1 tablet (2.5 mg total) by mouth 2 (two) times daily. 11/10/23   Lelon Hamilton T, PA-C  dorzolamide  (TRUSOPT ) 2 % ophthalmic solution Place 1 drop into both eyes 2 (two) times daily.    [provider]  furosemide  (LASIX ) 40 MG tablet Take 1 tablet (40 mg total) by mouth 2 (two) times daily. 07/05/24 10/03/24  Sherrill Cable Latif, DO  gabapentin  (NEURONTIN ) 100 MG capsule Take 100-200 mg by mouth See admin instructions. Take 1 capsule (100mg ) by mouth every morning and take 2 capsules (200mg ) by mouth at bedtime    [provider]  Glycopyrrolate -Formoterol  (BEVESPI  AEROSPHERE) 9-4.8 MCG/ACT AERO Inhale 2 puffs into the lungs 2 (two) times daily. Patient not taking: Reported on 07/19/2024 06/17/24   Rojelio Nest, DO  hydrOXYzine (ATARAX) 25 MG tablet Take 25 mg by mouth every 8 (eight) hours as needed for itching. 06/06/22   [provider]  ipratropium-albuterol  (DUONEB) 0.5-2.5 (3) MG/3ML SOLN Take 3 mLs by nebulization every 6 (six) hours as needed. Patient taking differently: Take 3 mLs by nebulization every 6 (six) hours as needed (shortness of breath). 11/06/21   Patel, Sona, MD  latanoprost  (XALATAN ) 0.005 % ophthalmic solution Place 1 drop into both eyes in the morning.    [provider]  metolazone  (ZAROXOLYN ) 2.5 MG tablet Take 1 tablet (2.5 mg total) by mouth as needed. Patient taking differently: Take 2.5 mg by mouth as needed (edema). 02/19/24 07/19/24  Donette Ellouise LABOR, FNP  NITROSTAT  0.4 MG SL tablet DISSOLVE ONE TABLET UNDER TONGUE AS NEEDED FOR CHEST PAIN -  MAY REPEAT TWICE-IF NO RELIEF GO TO NEAREST HOSPITAL ER Patient taking differently: Place 0.4 mg under the tongue every 5 (five) minutes as needed for chest pain. 05/06/13   Inocencio Berwyn LABOR, MD  nystatin  (MYCOSTATIN /NYSTOP ) powder Apply topically 3 (three) times daily. 06/24/24   Ezenduka, Nkeiruka J, MD  pantoprazole  (PROTONIX ) 40 MG tablet Take 1 tablet (40 mg total) by mouth daily. 10/14/23   Lelon Hamilton T, PA-C  potassium chloride  SA (KLOR-CON  M) 20 MEQ tablet 40meq BID X 2 days, then decrease to 20meq daily 08/24/24   Donette Ellouise LABOR, FNP  rosuvastatin  (CRESTOR ) 20 MG tablet Take 20 mg by mouth daily.    [provider]  Tiotropium Bromide-Olodaterol (STIOLTO RESPIMAT) 2.5-2.5 MCG/ACT AERS Inhale 2 puffs into the lungs 2 (two) times daily.  [provider]    Physical Exam: Vitals:   09/02/24 1745 09/02/24 1830 09/02/24 1845 09/02/24 1930  BP: (!) 121/44 (!) 130/45  (!) 143/55  Pulse: 72 62 75 74  Resp: 19   (!) 30  Temp:    97.9 F (36.6 C)  TempSrc:    Axillary  SpO2: 100% 93% 96% 95%  Weight:    87.1 kg    Physical Exam Vitals reviewed.  HENT:     Head: Normocephalic and atraumatic.  Eyes:     Extraocular Movements: Extraocular movements intact.  Cardiovascular:     Rate and Rhythm: Normal rate and regular rhythm.     Pulses: Normal pulses.  Pulmonary:     Effort: Respiratory distress present.     Breath sounds: Wheezing present.  Abdominal:     General: Bowel sounds are normal.     Palpations: Abdomen is soft.     Tenderness: There is no abdominal tenderness. There is no guarding.  Musculoskeletal:     Cervical back: Normal range of motion.     Right lower leg: No edema.     Left lower leg: No edema.  Skin:    General: Skin is warm and dry.  Neurological:     General: No focal deficit present.     Mental Status: She is alert and oriented to person, place, and time.     Labs on Admission: I have personally reviewed following labs and  imaging studies  CBC: Recent Labs  Lab 09/02/24 1535  WBC 11.1*  NEUTROABS 9.2*  HGB 10.1*  HCT 32.9*  MCV 88.9  PLT 341   Basic Metabolic Panel: Recent Labs  Lab 09/02/24 1535  NA 133*  K 3.2*  CL 96*  CO2 23  GLUCOSE 200*  BUN 34*  CREATININE 1.88*  CALCIUM  6.7*   GFR: Estimated Creatinine Clearance: 21.2 mL/min (A) (by C-G formula based on SCr of 1.88 mg/dL (H)). Liver Function Tests: Recent Labs  Lab 09/02/24 1535  AST 16  ALT 11  ALKPHOS 76  BILITOT 1.2  PROT 6.0*  ALBUMIN 2.6*   No results for input(s): LIPASE, AMYLASE in the last 168 hours. No results for input(s): AMMONIA in the last 168 hours. Coagulation Profile: No results for input(s): INR, PROTIME in the last 168 hours. Cardiac Enzymes: No results for input(s): CKTOTAL, CKMB, CKMBINDEX, TROPONINI in the last 168 hours. BNP (last 3 results) No results for input(s): PROBNP in the last 8760 hours. HbA1C: No results for input(s): HGBA1C in the last 72 hours. CBG: No results for input(s): GLUCAP in the last 168 hours. Lipid Profile: No results for input(s): CHOL, HDL, LDLCALC, TRIG, CHOLHDL, LDLDIRECT in the last 72 hours. Thyroid  Function Tests: No results for input(s): TSH, T4TOTAL, FREET4, T3FREE, THYROIDAB in the last 72 hours. Anemia Panel: No results for input(s): VITAMINB12, FOLATE, FERRITIN, TIBC, IRON , RETICCTPCT in the last 72 hours. Urine analysis:    Component Value Date/Time   COLORURINE YELLOW 07/01/2024 2102   APPEARANCEUR CLEAR 07/01/2024 2102   LABSPEC 1.014 07/01/2024 2102   PHURINE 6.0 07/01/2024 2102   GLUCOSEU 50 (A) 07/01/2024 2102   HGBUR NEGATIVE 07/01/2024 2102   BILIRUBINUR NEGATIVE 07/01/2024 2102   KETONESUR NEGATIVE 07/01/2024 2102   PROTEINUR 30 (A) 07/01/2024 2102   NITRITE NEGATIVE 07/01/2024 2102   LEUKOCYTESUR NEGATIVE 07/01/2024 2102    Radiological Exams on Admission: DG Chest Port 1  View Result Date: 09/02/2024 EXAM: 1 VIEW(S) XRAY OF THE CHEST 09/02/2024 06:09:00  PM COMPARISON: 07/05/2024 CLINICAL HISTORY: sob. SOB FINDINGS: LUNGS AND PLEURA: No focal pulmonary opacity. No pulmonary edema. No pleural effusion. No pneumothorax. HEART AND MEDIASTINUM: Cardiomegaly. Aortic calcification. BONES AND SOFT TISSUES: No acute osseous abnormality. IMPRESSION: 1. No acute abnormalities. Electronically signed by: Pinkie Pebbles MD 09/02/2024 06:30 PM EDT RP Workstation: [id number]    EKG: No EKG done in the ED.  Ordered now and currently pending.  Assessment and Plan  Acute respiratory failure secondary to acute COPD exacerbation Chest x-ray showing no acute abnormalities.  Patient continues to have wheezing on exam.  She was initially on BiPAP in the ED and had requested to be taken off of it.  Currently not hypoxic at rest but continues to have increased work of breathing.  She now agrees to go back on BiPAP.  Continue treatment with IV steroids, scheduled DuoNeb every 6 hours, Pulmicort  neb twice daily, albuterol  neb every 2 hours as needed.  Continue azithromycin  (EKG ordered to check QT interval).  Incentive spirometry, flutter valve.  Continue BiPAP, wean as tolerated.  Respiratory viral panel ordered.  Chronic HFpEF Echo done in July 2025 showing EF 60 to 65%, mildly elevated pulmonary artery systolic pressure, moderate biatrial dilation, trivial mitral regurgitation, and moderate mitral stenosis.  BNP 236.  Patient does not appear overtly volume overloaded on exam.  Chest x-ray not suggestive of signs of volume overload.  She was given IV Lasix  20 mg in the ED.  Continue home p.o. Lasix  40 mg twice daily starting in the morning if no longer on BiPAP.  Monitor intake and output, daily weights, and renal function.  Dietary sodium and fluid restriction.  Mild hyponatremia Monitor labs.  Mild hypokalemia Monitor potassium and magnesium  levels, continue to replace as  needed.  Hypocalcemia Replace calcium  and monitor labs.  History of persistent A-fib Continue Eliquis .  Hyperlipidemia Continue Crestor .  CAD Patient is not endorsing chest pain.  EKG pending.  Continue Crestor  and Eliquis .  CKD stage IV Creatinine stable, monitor labs.  Type 2 diabetes Glucose 200 in the setting of steroid use.  A1c 6.6 in July 2025.  Placed on sensitive sliding scale insulin  ACHS.  Chronic normocytic anemia Hemoglobin stable.  GERD Continue Protonix .  Chronic pain/neuropathy Continue gabapentin .  Home medications initiated per patient's reported history.  Awaiting pharmacy med rec for verification.   DVT prophylaxis: Eliquis  Code Status: DNR/DNI (discussed with the patient) Family Communication: No family available at this time. Level of care: Progressive Care Unit Admission status: It is my clinical opinion that admission to INPATIENT is reasonable and necessary because of the expectation that this patient will require hospital care that crosses at least 2 midnights to treat this condition based on the medical complexity of the problems presented.  Given the aforementioned information, the predictability of an adverse outcome is felt to be significant.  Editha Ram MD [facility]  If 7PM-7AM, please contact night-coverage [email or web address]  09/02/2024, 8:17 PM

## 2024-09-02 NOTE — ED Notes (Signed)
 Bipap paused by patient request. Tolerating without dyspnea

## 2024-09-02 NOTE — ED Notes (Signed)
 PT sttaes IV is hurting since placed upon arrival. PT IV assessed by 2 RN no problems noted IV flushed and blood return noted. PT informed IV is good and working as should.

## 2024-09-02 NOTE — ED Triage Notes (Signed)
 BIB [facility] from home. Noncompliant w/ Albuterol  neb treatments. COPD, 2mg  Mag, 125 solumedrol, 5 duo nebs given. Rhonchi, and wheezing,  98% on CPAP 120/72

## 2024-09-02 NOTE — ED Notes (Signed)
 4E called and aware PT is on the way no needs noted and VSS

## 2024-09-02 NOTE — ED Provider Notes (Signed)
 [place] EMERGENCY DEPARTMENT AT [facility] Provider Note   CSN: [id number] Arrival date & time: 09/02/24  8365     Patient presents with: Shortness of Breath   Jenny [name] is a 89 y.o. female.   HPI Patient presents with dyspnea.  History of smoking for a long time, though she quit 3 years ago.  She has a history of COPD, EMS reports patient received magnesium , Solu-Medrol , DuoNebs, and improvement in rhonchi.  Patient required CPAP. Patient denies chest pain, notes, dyspnea, no focal pain anywhere else.  No fall, no confusion.    Prior to Admission medications   Medication Sig Start Date End Date Taking? Authorizing Provider  acetaminophen  (TYLENOL ) 500 MG tablet Take 500 mg by mouth every 6 (six) hours as needed (for knee, hip pain).    [provider]  albuterol  (VENTOLIN  HFA) 108 (90 Base) MCG/ACT inhaler INHALE ONE PUFF EVERY 6 HOURS AS NEEDED FOR SHORTNESS OF BREATH 11/03/21   Claudene Arthea SQUIBB, MD  apixaban  (ELIQUIS ) 2.5 MG TABS tablet Take 1 tablet (2.5 mg total) by mouth 2 (two) times daily. 11/10/23   Lelon Hamilton T, PA-C  dorzolamide  (TRUSOPT ) 2 % ophthalmic solution Place 1 drop into both eyes 2 (two) times daily.    [provider]  furosemide  (LASIX ) 40 MG tablet Take 1 tablet (40 mg total) by mouth 2 (two) times daily. 07/05/24 10/03/24  Sherrill Cable Latif, DO  gabapentin  (NEURONTIN ) 100 MG capsule Take 100-200 mg by mouth See admin instructions. Take 1 capsule (100mg ) by mouth every morning and take 2 capsules (200mg ) by mouth at bedtime    [provider]  Glycopyrrolate -Formoterol  (BEVESPI  AEROSPHERE) 9-4.8 MCG/ACT AERO Inhale 2 puffs into the lungs 2 (two) times daily. Patient not taking: Reported on 07/19/2024 06/17/24   Rojelio Nest, DO  hydrOXYzine (ATARAX) 25 MG tablet Take 25 mg by mouth every 8 (eight) hours as needed for itching. 06/06/22   [provider]  ipratropium-albuterol  (DUONEB) 0.5-2.5 (3) MG/3ML SOLN  Take 3 mLs by nebulization every 6 (six) hours as needed. Patient taking differently: Take 3 mLs by nebulization every 6 (six) hours as needed (shortness of breath). 11/06/21   Patel, Sona, MD  latanoprost  (XALATAN ) 0.005 % ophthalmic solution Place 1 drop into both eyes in the morning.    [provider]  metolazone  (ZAROXOLYN ) 2.5 MG tablet Take 1 tablet (2.5 mg total) by mouth as needed. Patient taking differently: Take 2.5 mg by mouth as needed (edema). 02/19/24 07/19/24  [place] A, FNP  NITROSTAT  0.4 MG SL tablet DISSOLVE ONE TABLET UNDER TONGUE AS NEEDED FOR CHEST PAIN - MAY REPEAT TWICE-IF NO RELIEF GO TO NEAREST HOSPITAL ER Patient taking differently: Place 0.4 mg under the tongue every 5 (five) minutes as needed for chest pain. 05/06/13   Inocencio Berwyn LABOR, MD  nystatin  (MYCOSTATIN /NYSTOP ) powder Apply topically 3 (three) times daily. 06/24/24   Ezenduka, Nkeiruka J, MD  pantoprazole  (PROTONIX ) 40 MG tablet Take 1 tablet (40 mg total) by mouth daily. 10/14/23   Lelon Hamilton T, PA-C  potassium chloride  SA (KLOR-CON  M) 20 MEQ tablet 40meq BID X 2 days, then decrease to 20meq daily 08/24/24   [place] LABOR, FNP  rosuvastatin  (CRESTOR ) 20 MG tablet Take 20 mg by mouth daily.    [provider]  Tiotropium Bromide-Olodaterol (STIOLTO RESPIMAT) 2.5-2.5 MCG/ACT AERS Inhale 2 puffs into the lungs 2 (two) times daily.    [provider]    Allergies: Aspirin ,  Entresto  [sacubitril -valsartan ], Atorvastatin , Cyclobenzaprine, Lactose intolerance (gi), Meperidine hcl, Pravastatin , Propoxyphene, and Wound dressing adhesive    Review of Systems  Updated Vital Signs BP (!) 130/45   Pulse 75   Temp 98.1 F (36.7 C) (Axillary)   Resp 19   SpO2 96%   Physical Exam Vitals and nursing note reviewed.  Constitutional:      General: She is in acute distress.     Appearance: She is obese. She is ill-appearing and diaphoretic.  HENT:     Head: Normocephalic and  atraumatic.  Eyes:     Conjunctiva/sclera: Conjunctivae normal.  Cardiovascular:     Rate and Rhythm: Normal rate and regular rhythm.  Pulmonary:     Effort: Tachypnea and accessory muscle usage present. No respiratory distress.     Breath sounds: No stridor. Decreased breath sounds present.  Abdominal:     General: There is no distension.  Skin:    General: Skin is warm.  Neurological:     Mental Status: She is alert and oriented to person, place, and time.     Cranial Nerves: No cranial nerve deficit.  Psychiatric:        Mood and Affect: Mood normal.     (all labs ordered are listed, but only abnormal results are displayed) Labs Reviewed  COMPREHENSIVE METABOLIC PANEL WITH GFR - Abnormal; Notable for the following components:      Result Value   Sodium 133 (*)    Potassium 3.2 (*)    Chloride 96 (*)    Glucose, Bld 200 (*)    BUN 34 (*)    Creatinine, Ser 1.88 (*)    Calcium  6.7 (*)    Total Protein 6.0 (*)    Albumin 2.6 (*)    GFR, Estimated 25 (*)    All other components within normal limits  CBC WITH DIFFERENTIAL/PLATELET - Abnormal; Notable for the following components:   WBC 11.1 (*)    RBC 3.70 (*)    Hemoglobin 10.1 (*)    HCT 32.9 (*)    RDW 16.8 (*)    Neutro Abs 9.2 (*)    Abs Immature Granulocytes 0.12 (*)    All other components within normal limits  BRAIN NATRIURETIC PEPTIDE - Abnormal; Notable for the following components:   B Natriuretic Peptide 236.8 (*)    All other components within normal limits    EKG: None  Radiology: [facility] Chest Port 1 View Result Date: 09/02/2024 EXAM: 1 VIEW(S) XRAY OF THE CHEST 09/02/2024 06:09:00 PM COMPARISON: 07/05/2024 CLINICAL HISTORY: sob. SOB FINDINGS: LUNGS AND PLEURA: No focal pulmonary opacity. No pulmonary edema. No pleural effusion. No pneumothorax. HEART AND MEDIASTINUM: Cardiomegaly. Aortic calcification. BONES AND SOFT TISSUES: No acute osseous abnormality. IMPRESSION: 1. No acute abnormalities.  Electronically signed by: Pinkie Pebbles MD 09/02/2024 06:30 PM EDT RP Workstation: [id number]     Procedures   Medications Ordered in the ED  albuterol  (PROVENTIL ) (2.5 MG/3ML) 0.083% nebulizer solution 2.5 mg (2.5 mg Nebulization Given 09/02/24 1800)  dexamethasone  (DECADRON ) injection 10 mg (10 mg Intravenous Given 09/02/24 1745)                                    Medical Decision Making Obese elderly female former smoker presents in respiratory distress, requiring CPAP in transport, BiPAP on arrival to the ED.  Patient's mentation is appropriate she is afebrile, awake, alert, with her history of COPD suspicion  for exacerbation, rule out pneumonia, CHF or other contributors. Pulse ox 99% with BiPAP abnormal Cardiac 75 sinus normal  Amount and/or Complexity of Data Reviewed Independent Historian: EMS External Data Reviewed: notes. Labs: ordered. Decision-making details documented in ED Course. Radiology: ordered and independent interpretation performed. Decision-making details documented in ED Course. ECG/medicine tests: ordered and independent interpretation performed. Decision-making details documented in ED Course.  Risk Prescription drug management. Decision regarding hospitalization. Diagnosis or treatment significantly limited by social determinants of health.   7:17 PM Off BiPAP patient with mild persistent dyspnea and visibly increased work of breathing, though markedly improved, with concern for COPD exacerbation patient will require admission.  With elevated BNP, likely some multifactorial etiology as well.  Patient has received steroids, bronchodilators will receive Lasix , azithromycin  as well. With.  Improved mentation, absence of fever, low suspicion for bacteremia, sepsis.   CRITICAL CARE Performed by: Lamar Salen Total critical care time: 35 minutes Critical care time was exclusive of separately billable procedures and treating other patients. Critical  care was necessary to treat or prevent imminent or life-threatening deterioration. Critical care was time spent personally by me on the following activities: development of treatment plan with patient and/or surrogate as well as nursing, discussions with consultants, evaluation of patient's response to treatment, examination of patient, obtaining history from patient or surrogate, ordering and performing treatments and interventions, ordering and review of laboratory studies, ordering and review of radiographic studies, pulse oximetry and re-evaluation of patient's condition.   Final diagnoses:  Respiratory distress    ED Discharge Orders     None          Salen Lamar, MD 09/02/24 1919

## 2024-09-03 DIAGNOSIS — Z8679 Personal history of other diseases of the circulatory system: Secondary | ICD-10-CM

## 2024-09-03 DIAGNOSIS — E871 Hypo-osmolality and hyponatremia: Secondary | ICD-10-CM | POA: Diagnosis not present

## 2024-09-03 DIAGNOSIS — I5032 Chronic diastolic (congestive) heart failure: Secondary | ICD-10-CM | POA: Diagnosis not present

## 2024-09-03 DIAGNOSIS — K219 Gastro-esophageal reflux disease without esophagitis: Secondary | ICD-10-CM

## 2024-09-03 DIAGNOSIS — J441 Chronic obstructive pulmonary disease with (acute) exacerbation: Secondary | ICD-10-CM | POA: Diagnosis not present

## 2024-09-03 DIAGNOSIS — E876 Hypokalemia: Secondary | ICD-10-CM

## 2024-09-03 DIAGNOSIS — N184 Chronic kidney disease, stage 4 (severe): Secondary | ICD-10-CM

## 2024-09-03 LAB — GLUCOSE, CAPILLARY
Glucose-Capillary: 199 mg/dL — ABNORMAL HIGH (ref 70–99)
Glucose-Capillary: 275 mg/dL — ABNORMAL HIGH (ref 70–99)
Glucose-Capillary: 184 mg/dL — ABNORMAL HIGH (ref 70–99)
Glucose-Capillary: 201 mg/dL — ABNORMAL HIGH (ref 70–99)

## 2024-09-03 LAB — RESP PANEL BY RT-PCR (RSV, FLU A&B, COVID)  RVPGX2
Influenza A by PCR: NEGATIVE
Influenza B by PCR: NEGATIVE
Resp Syncytial Virus by PCR: NEGATIVE
SARS Coronavirus 2 by RT PCR: NEGATIVE

## 2024-09-03 LAB — RESPIRATORY PANEL BY PCR

## 2024-09-03 LAB — RENAL FUNCTION PANEL
Albumin: 2.4 g/dL — ABNORMAL LOW (ref 3.5–5.0)
Anion gap: 15 (ref 5–15)
BUN: 36 mg/dL — ABNORMAL HIGH (ref 8–23)
CO2: 23 mmol/L (ref 22–32)
Calcium: 7.2 mg/dL — ABNORMAL LOW (ref 8.9–10.3)
Chloride: 94 mmol/L — ABNORMAL LOW (ref 98–111)
Creatinine, Ser: 1.97 mg/dL — ABNORMAL HIGH (ref 0.44–1.00)
GFR, Estimated: 24 mL/min — ABNORMAL LOW (ref >60)
Glucose, Bld: 247 mg/dL — ABNORMAL HIGH (ref 70–99)
Phosphorus: 4 mg/dL (ref 2.5–4.6)
Potassium: 3.8 mmol/L (ref 3.5–5.1)
Sodium: 132 mmol/L — ABNORMAL LOW (ref 135–145)

## 2024-09-03 LAB — CBC
HCT: 31.4 % — ABNORMAL LOW (ref 36.0–46.0)
Hemoglobin: 9.8 g/dL — ABNORMAL LOW (ref 12.0–15.0)
MCH: 27.4 pg (ref 26.0–34.0)
MCHC: 31.2 g/dL (ref 30.0–36.0)
MCV: 87.7 fL (ref 80.0–100.0)
Platelets: 317 K/uL (ref 150–400)
RBC: 3.58 MIL/uL — ABNORMAL LOW (ref 3.87–5.11)
RDW: 16.8 % — ABNORMAL HIGH (ref 11.5–15.5)
WBC: 7.6 K/uL (ref 4.0–10.5)
nRBC: 0 % (ref 0.0–0.2)

## 2024-09-03 LAB — MAGNESIUM: Magnesium: 2.2 mg/dL (ref 1.7–2.4)

## 2024-09-03 MED ORDER — METHYLPREDNISOLONE SODIUM SUCC 40 MG IJ SOLR
40.0000 mg | Freq: Two times a day (BID) | INTRAMUSCULAR | Status: AC
  Administered 2024-09-03: 40 mg via INTRAVENOUS
  Filled 2024-09-03: qty 1

## 2024-09-03 MED ORDER — POTASSIUM CHLORIDE CRYS ER 20 MEQ PO TBCR
20.0000 meq | EXTENDED_RELEASE_TABLET | Freq: Every day | ORAL | Status: DC
  Administered 2024-09-03 – 2024-09-04 (×2): 20 meq via ORAL
  Filled 2024-09-03 (×2): qty 1

## 2024-09-03 MED ORDER — INSULIN ASPART 100 UNIT/ML IJ SOLN
0.0000 [IU] | Freq: Three times a day (TID) | INTRAMUSCULAR | Status: DC
  Administered 2024-09-03: 3 [IU] via SUBCUTANEOUS
  Administered 2024-09-04: 8 [IU] via SUBCUTANEOUS
  Administered 2024-09-04: 3 [IU] via SUBCUTANEOUS

## 2024-09-03 MED ORDER — PREDNISONE 20 MG PO TABS
50.0000 mg | ORAL_TABLET | Freq: Every day | ORAL | Status: DC
  Administered 2024-09-04: 50 mg via ORAL
  Filled 2024-09-03: qty 1

## 2024-09-03 MED ORDER — AZITHROMYCIN 250 MG PO TABS
250.0000 mg | ORAL_TABLET | Freq: Every day | ORAL | Status: DC
  Administered 2024-09-04: 250 mg via ORAL
  Filled 2024-09-03: qty 1

## 2024-09-03 MED ORDER — INSULIN ASPART 100 UNIT/ML IJ SOLN: 0.0000 [IU] | Freq: Every day | INTRAMUSCULAR | Status: DC

## 2024-09-03 MED ORDER — AZITHROMYCIN 500 MG PO TABS: 500.0000 mg | ORAL_TABLET | Freq: Every day | ORAL | Status: DC

## 2024-09-03 MED ORDER — FLUTICASONE FUROATE-VILANTEROL 200-25 MCG/ACT IN AEPB
1.0000 | INHALATION_SPRAY | Freq: Every day | RESPIRATORY_TRACT | Status: DC
  Filled 2024-09-03 (×2): qty 28

## 2024-09-03 NOTE — Assessment & Plan Note (Signed)
 CBG elevated.  A1c of 6.6 in July 2025. -Making SSI moderate -Continue to monitor

## 2024-09-03 NOTE — Assessment & Plan Note (Signed)
 Mild pseudohyponatremia secondary to hyperglycemia. - Continue to monitor

## 2024-09-03 NOTE — Hospital Course (Addendum)
 Partly taken from H&P.   Jenny [name] is a 89 y.o. female with medical history significant of HFpEF, hypertension, persistent A-fib on Eliquis , tachybradycardia syndrome, hyperlipidemia, CAD, COPD, CKD stage IV, type 2 diabetes, anemia, GERD, class II obesity presents to the ED via EMS for evaluation of shortness of breath and rhonchi/wheezing.  She was placed on CPAP by EMS and was given Solu-Medrol  125 mg, IV mag 2 g, and DuoNebs prior to arrival.  On arrival to the ED, patient was placed on BiPAP due to respiratory distress.   Labs with leukocytosis at 11.1, stable hemoglobin at 10.1, BNP 236, creatinine 1.8 (stable),  Chest x-ray showing no acute abnormalities.  Patient was given albuterol  neb, IV Decadron  10 mg, IV Lasix  20 mg, and azithromycin  in the ED.  Eventually taken off BiPAP.  Admitted for COPD exacerbation  9/26: Vital stable on room air, pseudohyponatremia with sodium at 132 and blood glucose of 247, creatinine 1.97 which remained around her baseline, leukocytosis resolved, respiratory panel for COVID, influenza and RSV negative, expanded respiratory viral panel was also negative.  9/27: Patient remained hemodynamically stable on room air.  PT evaluated her and recommended home health services, maximum home health services were ordered as she lives alone.  Case was discussed with her son.  Little difficult social situation but son will try to help.  Apparently patient was not taking her medications or following dietary restrictions at home resulted in multiple ED visits and hospitalizations.  She does not even want to go to assisted living or a nursing home.  I advised the son to discuss with primary care provider.  He is being discharged on 2 more days of Zithromax  and prednisone .  No more wheezing and patient does not required any more oxygen.  Patient will continue on current medications and need to have a close follow-up with her providers for further assistance.  She will remain  high risk for readmission based on her behavior and being noncompliant.

## 2024-09-03 NOTE — Assessment & Plan Note (Signed)
 Secondary to COPD exacerbation.  Initially required BiPAP, now weaned back to room air.  No baseline oxygen use. Respiratory viral panel negative -Switching Solu-Medrol  with prednisone  -Continue with bronchodilator -Continue with Zithromax  to complete a 5-day course -Continue with supportive care and supplemental oxygen as needed

## 2024-09-03 NOTE — Assessment & Plan Note (Signed)
 Continue with home Eliquis .

## 2024-09-03 NOTE — Progress Notes (Signed)
 Progress Note   Patient: Jenny [name] FMW:[id number] DOB: [birth year] DOA: 09/02/2024     1 DOS: the patient was seen and examined on 09/03/2024   Brief hospital course: Partly taken from H&P.   Jenny [name] is a 89 y.o. female with medical history significant of HFpEF, hypertension, persistent A-fib on Eliquis , tachybradycardia syndrome, hyperlipidemia, CAD, COPD, CKD stage IV, type 2 diabetes, anemia, GERD, class II obesity presents to the ED via EMS for evaluation of shortness of breath and rhonchi/wheezing.  She was placed on CPAP by EMS and was given Solu-Medrol  125 mg, IV mag 2 g, and DuoNebs prior to arrival.  On arrival to the ED, patient was placed on BiPAP due to respiratory distress.   Labs with leukocytosis at 11.1, stable hemoglobin at 10.1, BNP 236, creatinine 1.8 (stable),  Chest x-ray showing no acute abnormalities.  Patient was given albuterol  neb, IV Decadron  10 mg, IV Lasix  20 mg, and azithromycin  in the ED.  Eventually taken off BiPAP.  Admitted for COPD exacerbation  9/26: Vital stable on room air, pseudohyponatremia with sodium at 132 and blood glucose of 247, creatinine 1.97 which remained around her baseline, leukocytosis resolved, respiratory panel for COVID, influenza and RSV negative, expanded respiratory viral panel was also negative.   Assessment and Plan: Acute respiratory failure (HCC) Secondary to COPD exacerbation.  Initially required BiPAP, now weaned back to room air.  No baseline oxygen use. Respiratory viral panel negative -Switching Solu-Medrol  with prednisone  -Continue with bronchodilator -Continue with Zithromax  to complete a 5-day course -Continue with supportive care and supplemental oxygen as needed  Chronic heart failure with preserved ejection fraction (HFpEF) (HCC) Mildly elevated BNP at 236 but patient also has underlying CKD stage IV.  Echocardiogram done in July with normal EF, mildly elevated pulmonary arterial pressure, moderate biatrial  dilatation and trivial MR, moderate MS. Received 1 dose of IV Lasix  20 mg in the ED.  Clinically appears euvolemic -Continuing home Lasix  40 mg twice daily -Daily weight and BMP  CAD (coronary artery disease) No chest pain. -Continue home Crestor  and Eliquis   Hyponatremia Mild pseudohyponatremia secondary to hyperglycemia. - Continue to monitor  Hypokalemia Resolved. - Continue with home potassium supplement  History of atrial fibrillation - Continue with home Eliquis   Diabetes mellitus CBG elevated.  A1c of 6.6 in July 2025. -Making SSI moderate -Continue to monitor  CKD (chronic kidney disease), stage IV (HCC) Renal function seems stable around baseline. - Monitor renal function -Avoid nephrotoxins  Other and unspecified hyperlipidemia - Continue home statin  GERD (gastroesophageal reflux disease) - Continue Protonix       Subjective: Patient was still having some cough but shortness of breath seems improving.  She lives alone and currently not at her baseline.  Physical Exam: Vitals:   09/03/24 0805 09/03/24 0849 09/03/24 1124 09/03/24 1402  BP:  (!) 129/53 (!) 123/55   Pulse:  66 65   Resp:  20 16   Temp:  97.9 F (36.6 C) 97.9 F (36.6 C)   TempSrc:  Oral Oral   SpO2: 100% 98% 98% 99%  Weight:       General.  Frail and obese elderly lady, in no acute distress. Pulmonary.  Mild scattered wheeze bilaterally, normal respiratory effort. CV.  Regular rate and rhythm, no JVD, rub or murmur. Abdomen.  Soft, nontender, nondistended, BS positive. CNS.  Alert and oriented .  No focal neurologic deficit. Extremities.  No edema, no cyanosis, pulses intact and symmetrical. Psychiatry.  Judgment and  insight appears normal.   Data Reviewed: Prior data reviewed  Family Communication: Tried calling son with no response  Disposition: Status is: Inpatient Remains inpatient appropriate because: Severity of illness  Planned Discharge Destination: Home  DVT  prophylaxis.  Eliquis  Time spent: 50 minutes  This record has been created using Conservation officer, historic buildings. Errors have been sought and corrected,but may not always be located. Such creation errors do not reflect on the standard of care.   Author: Amaryllis Dare, MD 09/03/2024 4:05 PM  For on call review [email or web address].

## 2024-09-03 NOTE — Assessment & Plan Note (Signed)
 Mildly elevated BNP at 236 but patient also has underlying CKD stage IV.  Echocardiogram done in July with normal EF, mildly elevated pulmonary arterial pressure, moderate biatrial dilatation and trivial MR, moderate MS. Received 1 dose of IV Lasix  20 mg in the ED.  Clinically appears euvolemic -Continuing home Lasix  40 mg twice daily -Daily weight and BMP

## 2024-09-03 NOTE — Assessment & Plan Note (Signed)
-   Continue home statin

## 2024-09-03 NOTE — Assessment & Plan Note (Signed)
 Resolved. - Continue with home potassium supplement

## 2024-09-03 NOTE — Assessment & Plan Note (Signed)
Renal function seems stable around baseline. -Monitor renal function -Avoid nephrotoxins

## 2024-09-03 NOTE — Assessment & Plan Note (Signed)
-

## 2024-09-03 NOTE — Assessment & Plan Note (Signed)
 No chest pain. -Continue home Crestor  and Eliquis 

## 2024-09-04 ENCOUNTER — Other Ambulatory Visit (HOSPITAL_COMMUNITY): Payer: Self-pay

## 2024-09-04 DIAGNOSIS — R0603 Acute respiratory distress: Secondary | ICD-10-CM | POA: Diagnosis not present

## 2024-09-04 DIAGNOSIS — I5032 Chronic diastolic (congestive) heart failure: Secondary | ICD-10-CM | POA: Diagnosis not present

## 2024-09-04 DIAGNOSIS — E871 Hypo-osmolality and hyponatremia: Secondary | ICD-10-CM | POA: Diagnosis not present

## 2024-09-04 DIAGNOSIS — J441 Chronic obstructive pulmonary disease with (acute) exacerbation: Secondary | ICD-10-CM | POA: Diagnosis not present

## 2024-09-04 LAB — BASIC METABOLIC PANEL WITH GFR
Anion gap: 14 (ref 5–15)
BUN: 44 mg/dL — ABNORMAL HIGH (ref 8–23)
CO2: 23 mmol/L (ref 22–32)
Calcium: 7.9 mg/dL — ABNORMAL LOW (ref 8.9–10.3)
Chloride: 97 mmol/L — ABNORMAL LOW (ref 98–111)
Creatinine, Ser: 1.99 mg/dL — ABNORMAL HIGH (ref 0.44–1.00)
GFR, Estimated: 24 mL/min — ABNORMAL LOW (ref >60)
Glucose, Bld: 185 mg/dL — ABNORMAL HIGH (ref 70–99)
Potassium: 4.3 mmol/L (ref 3.5–5.1)
Sodium: 134 mmol/L — ABNORMAL LOW (ref 135–145)

## 2024-09-04 LAB — GLUCOSE, CAPILLARY
Glucose-Capillary: 190 mg/dL — ABNORMAL HIGH (ref 70–99)
Glucose-Capillary: 272 mg/dL — ABNORMAL HIGH (ref 70–99)

## 2024-09-04 MED ORDER — AZITHROMYCIN 250 MG PO TABS
250.0000 mg | ORAL_TABLET | Freq: Every day | ORAL | 0 refills | Status: AC
  Filled 2024-09-04: qty 2, 2d supply, fill #0

## 2024-09-04 MED ORDER — INSULIN ASPART 100 UNIT/ML IJ SOLN
4.0000 [IU] | Freq: Three times a day (TID) | INTRAMUSCULAR | Status: DC
  Administered 2024-09-04: 4 [IU] via SUBCUTANEOUS

## 2024-09-04 MED ORDER — PREDNISONE 50 MG PO TABS
50.0000 mg | ORAL_TABLET | Freq: Every day | ORAL | 0 refills | Status: AC
  Filled 2024-09-04: qty 3, 3d supply, fill #0

## 2024-09-04 NOTE — Discharge Summary (Signed)
 Physician Discharge Summary   Patient: Jenny [name] MRN: [id number] DOB: 1933/11/15  Admit date:     09/02/2024  Discharge date: 09/04/24  Discharge Physician: Amaryllis Dare   PCP: Ileen Rosaline NOVAK, NP   Recommendations at discharge:  Please obtain CBC and BMP on follow-up Follow-up with primary care provider within a week  Discharge Diagnoses: Principal Problem:   COPD exacerbation (HCC) Active Problems:   Acute respiratory failure (HCC)   CAD (coronary artery disease)   Chronic heart failure with preserved ejection fraction (HFpEF) (HCC)   Hyponatremia   Hypokalemia   Diabetes mellitus   History of atrial fibrillation   CKD (chronic kidney disease), stage IV (HCC)   Other and unspecified hyperlipidemia   GERD (gastroesophageal reflux disease)  Hospital Course: Partly taken from H&P.   Jenny RIECKE is a 89 y.o. female with medical history significant of HFpEF, hypertension, persistent A-fib on Eliquis , tachybradycardia syndrome, hyperlipidemia, CAD, COPD, CKD stage IV, type 2 diabetes, anemia, GERD, class II obesity presents to the ED via EMS for evaluation of shortness of breath and rhonchi/wheezing.  She was placed on CPAP by EMS and was given Solu-Medrol  125 mg, IV mag 2 g, and DuoNebs prior to arrival.  On arrival to the ED, patient was placed on BiPAP due to respiratory distress.   Labs with leukocytosis at 11.1, stable hemoglobin at 10.1, BNP 236, creatinine 1.8 (stable),  Chest x-ray showing no acute abnormalities.  Patient was given albuterol  neb, IV Decadron  10 mg, IV Lasix  20 mg, and azithromycin  in the ED.  Eventually taken off BiPAP.  Admitted for COPD exacerbation  9/26: Vital stable on room air, pseudohyponatremia with sodium at 132 and blood glucose of 247, creatinine 1.97 which remained around her baseline, leukocytosis resolved, respiratory panel for COVID, influenza and RSV negative, expanded respiratory viral panel was also negative.  9/27: Patient  remained hemodynamically stable on room air.  PT evaluated her and recommended home health services, maximum home health services were ordered as she lives alone.  Case was discussed with her son.  Little difficult social situation but son will try to help.  Apparently patient was not taking her medications or following dietary restrictions at home resulted in multiple ED visits and hospitalizations.  She does not even want to go to assisted living or a nursing home.  I advised the son to discuss with primary care provider.  He is being discharged on 2 more days of Zithromax  and prednisone .  No more wheezing and patient does not required any more oxygen.  Patient will continue on current medications and need to have a close follow-up with her providers for further assistance.  She will remain high risk for readmission based on her behavior and being noncompliant.  Assessment and Plan: Acute respiratory failure (HCC) Secondary to COPD exacerbation.  Initially required BiPAP, now weaned back to room air.  No baseline oxygen use. Respiratory viral panel negative -Switching Solu-Medrol  with prednisone  -Continue with bronchodilator -Continue with Zithromax  to complete a 5-day course  Chronic heart failure with preserved ejection fraction (HFpEF) (HCC) Mildly elevated BNP at 236 but patient also has underlying CKD stage IV.  Echocardiogram done in July with normal EF, mildly elevated pulmonary arterial pressure, moderate biatrial dilatation and trivial MR, moderate MS. Received 1 dose of IV Lasix  20 mg in the ED.  Clinically appears euvolemic -Continuing home Lasix  40 mg twice daily  CAD (coronary artery disease) No chest pain. -Continue home Crestor  and Eliquis   Hyponatremia  Mild pseudohyponatremia secondary to hyperglycemia. - Continue to monitor  Hypokalemia Resolved. - Continue with home potassium supplement  History of atrial fibrillation - Continue with home Eliquis   Diabetes  mellitus CBG elevated.  A1c of 6.6 in July 2025. -Making SSI moderate -Continue to monitor  CKD (chronic kidney disease), stage IV (HCC) Renal function seems stable around baseline. - Monitor renal function -Avoid nephrotoxins  Other and unspecified hyperlipidemia - Continue home statin  GERD (gastroesophageal reflux disease) - Continue Protonix      Consultants: None Procedures performed: None Disposition: Home health Diet recommendation:  Discharge Diet Orders (From admission, onward)     Start     Ordered   09/04/24 0000  Diet - low sodium heart healthy        09/04/24 1231           Cardiac and Carb modified diet DISCHARGE MEDICATION: Allergies as of 09/04/2024       Reactions   Aspirin  Other (See Comments)   Can take 81 mg not 325 mg -bleeding   Entresto  [sacubitril -valsartan ] Shortness Of Breath   COPD and reports increased SOB with Entresto    Atorvastatin  Itching, Rash   Itching and myaliga   Cyclobenzaprine     muscle relaxers-ulcer hemorrhage (hospitalized), ulcer hemorrhage   Lactose Intolerance (gi) Other (See Comments)   GI upset   Meperidine Hcl Other (See Comments)   Not known   Pravastatin  Rash   Propoxyphene    Hallucination, hallucinations, vomiting   Wound Dressing Adhesive Other (See Comments)   red, stings        Medication List     TAKE these medications    acetaminophen  500 MG tablet Commonly known as: TYLENOL  Take 1,000 mg by mouth in the morning and at bedtime.   albuterol  108 (90 Base) MCG/ACT inhaler Commonly known as: Ventolin  HFA INHALE ONE PUFF EVERY 6 HOURS AS NEEDED FOR SHORTNESS OF BREATH   apixaban  2.5 MG Tabs tablet Commonly known as: ELIQUIS  Take 1 tablet (2.5 mg total) by mouth 2 (two) times daily.   azithromycin  250 MG tablet Commonly known as: ZITHROMAX  Take 1 tablet (250 mg total) by mouth daily for 2 days.   dorzolamide  2 % ophthalmic solution Commonly known as: TRUSOPT  Place 1 drop into both eyes  2 (two) times daily.   furosemide  40 MG tablet Commonly known as: LASIX  Take 1 tablet (40 mg total) by mouth 2 (two) times daily.   gabapentin  100 MG capsule Commonly known as: NEURONTIN  Take 100-200 mg by mouth See admin instructions. Take 1 capsule (100mg ) by mouth every morning and take 2 capsules (200mg ) by mouth at bedtime   hydrOXYzine 25 MG tablet Commonly known as: ATARAX Take 25 mg by mouth every 8 (eight) hours as needed for itching.   ipratropium-albuterol  0.5-2.5 (3) MG/3ML Soln Commonly known as: DUONEB Take 3 mLs by nebulization every 6 (six) hours as needed. What changed: reasons to take this   latanoprost  0.005 % ophthalmic solution Commonly known as: XALATAN  Place 1 drop into both eyes at bedtime.   metolazone  2.5 MG tablet Commonly known as: ZAROXOLYN  Take 1 tablet (2.5 mg total) by mouth as needed. What changed: reasons to take this   Nitrostat  0.4 MG SL tablet Generic drug: nitroGLYCERIN  DISSOLVE ONE TABLET UNDER TONGUE AS NEEDED FOR CHEST PAIN - MAY REPEAT TWICE-IF NO RELIEF GO TO NEAREST HOSPITAL ER What changed: See the new instructions.   nystatin  powder Commonly known as: MYCOSTATIN /NYSTOP  Apply topically 3 (three) times daily.  What changed:  how much to take when to take this reasons to take this   pantoprazole  40 MG tablet Commonly known as: Protonix  Take 1 tablet (40 mg total) by mouth daily.   potassium chloride  SA 20 MEQ tablet Commonly known as: KLOR-CON  M 40meq BID X 2 days, then decrease to 20meq daily What changed:  how much to take how to take this when to take this additional instructions   predniSONE  50 MG tablet Commonly known as: DELTASONE  Take 1 tablet (50 mg total) by mouth daily with breakfast for 3 days. Start taking on: September 05, 2024   rosuvastatin  20 MG tablet Commonly known as: CRESTOR  Take 20 mg by mouth daily.   Stiolto Respimat 2.5-2.5 MCG/ACT Aers Generic drug: Tiotropium Bromide-Olodaterol Inhale  2 puffs into the lungs daily.        Discharge Exam: Filed Weights   09/03/24 0000 09/03/24 0341 09/04/24 0537  Weight: 88 kg 89.2 kg 89.8 kg   General.  Frail and obese elderly lady, in no acute distress. Pulmonary.  Lungs clear bilaterally, normal respiratory effort. CV.  Regular rate and rhythm, no JVD, rub or murmur. Abdomen.  Soft, nontender, nondistended, BS positive. CNS.  Alert and oriented .  No focal neurologic deficit. Extremities.  No edema, no cyanosis, pulses intact and symmetrical. Psychiatry.  Judgment and insight appears normal.   Condition at discharge: stable  The results of significant diagnostics from this hospitalization (including imaging, microbiology, ancillary and laboratory) are listed below for reference.   Imaging Studies: DG Chest Port 1 View Result Date: 09/02/2024 EXAM: 1 VIEW(S) XRAY OF THE CHEST 09/02/2024 06:09:00 PM COMPARISON: 07/05/2024 CLINICAL HISTORY: sob. SOB FINDINGS: LUNGS AND PLEURA: No focal pulmonary opacity. No pulmonary edema. No pleural effusion. No pneumothorax. HEART AND MEDIASTINUM: Cardiomegaly. Aortic calcification. BONES AND SOFT TISSUES: No acute osseous abnormality. IMPRESSION: 1. No acute abnormalities. Electronically signed by: Pinkie Pebbles MD 09/02/2024 06:30 PM EDT RP Workstation: [id number]    Microbiology: Results for orders placed or performed during the hospital encounter of 09/02/24  Resp panel by RT-PCR (RSV, Flu A&B, Covid) Nasopharyngeal Swab     Status: None   Collection Time: 09/03/24 12:47 AM   Specimen: Nasopharyngeal Swab; Nasal Swab  Result Value Ref Range Status   SARS Coronavirus 2 by RT PCR NEGATIVE NEGATIVE Final   Influenza A by PCR NEGATIVE NEGATIVE Final   Influenza B by PCR NEGATIVE NEGATIVE Final    Comment: (NOTE) The Xpert Xpress SARS-CoV-2/FLU/RSV plus assay is intended as an aid in the diagnosis of influenza from Nasopharyngeal swab specimens and should not be used as a sole basis for  treatment. Nasal washings and aspirates are unacceptable for Xpert Xpress SARS-CoV-2/FLU/RSV testing.  Fact Sheet for Patients: BloggerCourse.com  Fact Sheet for Healthcare Providers: SeriousBroker.it  This test is not yet approved or cleared by the [place]  FDA and has been authorized for detection and/or diagnosis of SARS-CoV-2 by FDA under an Emergency Use Authorization (EUA). This EUA will remain in effect (meaning this test can be used) for the duration of the COVID-19 declaration under Section 564(b)(1) of the Act, 21 U.S.C. section 360bbb-3(b)(1), unless the authorization is terminated or revoked.     Resp Syncytial Virus by PCR NEGATIVE NEGATIVE Final    Comment: (NOTE) Fact Sheet for Patients: BloggerCourse.com  Fact Sheet for Healthcare Providers: SeriousBroker.it  This test is not yet approved or cleared by the [place]  FDA and has been authorized for detection and/or diagnosis of SARS-CoV-2 by  FDA under an Emergency Use Authorization (EUA). This EUA will remain in effect (meaning this test can be used) for the duration of the COVID-19 declaration under Section 564(b)(1) of the Act, 21 U.S.C. section 360bbb-3(b)(1), unless the authorization is terminated or revoked.  Performed at [facility], [address] [place], [place] [address]   Respiratory (~20 pathogens) panel by PCR     Status: None   Collection Time: 09/03/24 12:47 AM   Specimen: Nasopharyngeal Swab; Respiratory  Result Value Ref Range Status   Adenovirus NOT DETECTED NOT DETECTED Final   Coronavirus 229E NOT DETECTED NOT DETECTED Final    Comment: (NOTE) The Coronavirus on the Respiratory Panel, DOES NOT test for the novel  Coronavirus (2019 nCoV)    Coronavirus HKU1 NOT DETECTED NOT DETECTED Final   Coronavirus NL63 NOT DETECTED NOT DETECTED Final   Coronavirus OC43 NOT DETECTED  NOT DETECTED Final   Metapneumovirus NOT DETECTED NOT DETECTED Final   Rhinovirus / Enterovirus NOT DETECTED NOT DETECTED Final   Influenza A NOT DETECTED NOT DETECTED Final   Influenza B NOT DETECTED NOT DETECTED Final   Parainfluenza Virus 1 NOT DETECTED NOT DETECTED Final   Parainfluenza Virus 2 NOT DETECTED NOT DETECTED Final   Parainfluenza Virus 3 NOT DETECTED NOT DETECTED Final   Parainfluenza Virus 4 NOT DETECTED NOT DETECTED Final   Respiratory Syncytial Virus NOT DETECTED NOT DETECTED Final   Bordetella pertussis NOT DETECTED NOT DETECTED Final   Bordetella Parapertussis NOT DETECTED NOT DETECTED Final   Chlamydophila pneumoniae NOT DETECTED NOT DETECTED Final   Mycoplasma pneumoniae NOT DETECTED NOT DETECTED Final    Comment: Performed at [facility], [address], [place], [place] [address]    Labs: CBC: Recent Labs  Lab 09/02/24 1535 09/03/24 0329  WBC 11.1* 7.6  NEUTROABS 9.2*  --   HGB 10.1* 9.8*  HCT 32.9* 31.4*  MCV 88.9 87.7  PLT 341 317   Basic Metabolic Panel: Recent Labs  Lab 09/02/24 1535 09/03/24 0329 09/04/24 0331  NA 133* 132* 134*  K 3.2* 3.8 4.3  CL 96* 94* 97*  CO2 23 23 23   GLUCOSE 200* 247* 185*  BUN 34* 36* 44*  CREATININE 1.88* 1.97* 1.99*  CALCIUM  6.7* 7.2* 7.9*  MG  --  2.2  --   PHOS  --  4.0  --    Liver Function Tests: Recent Labs  Lab 09/02/24 1535 09/03/24 0329  AST 16  --   ALT 11  --   ALKPHOS 76  --   BILITOT 1.2  --   PROT 6.0*  --   ALBUMIN 2.6* 2.4*   CBG: Recent Labs  Lab 09/03/24 1120 09/03/24 1716 09/03/24 2109 09/04/24 0617 09/04/24 1124  GLUCAP 275* 184* 201* 190* 272*    Discharge time spent: greater than 30 minutes.  This record has been created using Conservation officer, historic buildings. Errors have been sought and corrected,but may not always be located. Such creation errors do not reflect on the standard of care.   Signed: Amaryllis Dare, MD [facility] 09/04/2024

## 2024-09-04 NOTE — TOC Transition Note (Signed)
 Transition of Care [facility]) - Discharge Note   Patient Details  Name: Jenny [name] MRN: [id number] Date of Birth: 10/30/1933  [facility] CM/SW Contact:  Marval Gell, RN Phone Number: 09/04/2024, 3:50 PM   Clinical Narrative:     Requested to speak with patient and son regarding potential appeal to DC.  Actively listened to their concerns and answered questions regarding treatment plan and patient's progress throughout the hospital stay.  Allowed time and space for patient and family to voice concerns.  Offered opportunity to appeal DC, declined. Patient stating she just wants to go home at this point.  Disposition plan constructed with patient and family: HH- active with [facility] would like to continue, requested additional nursing if possible. Spoke w [facility] liaison, passed on this request for chronic disease management by [facility] RN and as much nursing coverage as possible.  Discussed progressive chronic diease management, COPD, and benefits of palliative services. Patient would like to resume with [facility] for palliative care, spoke w Matilda and provided information for referral.  DME- patient has needed DME at home. Son to provide transportation home.  Treatment team updated.   Final next level of care: Home w Home Health Services Barriers to Discharge: No Barriers Identified   Patient Goals and CMS Choice Patient states their goals for this hospitalization and ongoing recovery are:: to go home CMS Medicare.gov Compare Post Acute Care list provided to:: Patient Choice offered to / list presented to : Patient      Discharge Placement                       Discharge Plan and Services Additional resources added to the After Visit Summary for                  DME Arranged: N/A         HH Arranged: RN, PT, OT, Disease Management, Nurse's Aide HH Agency: [facility] Date HH Agency Contacted: 09/04/24 Time HH  Agency Contacted: 1550 Representative spoke with at [facility] Agency: [facility]  Social Drivers of Health (SDOH) Interventions SDOH Screenings   Food Insecurity: No Food Insecurity (09/03/2024)  Housing: Low Risk  (09/03/2024)  Transportation Needs: No Transportation Needs (09/03/2024)  Utilities: Not At Risk (09/03/2024)  Alcohol Screen: Low Risk  (07/02/2024)  Depression (PHQ2-9): Low Risk  (07/22/2022)  Financial Resource Strain: Low Risk  (07/02/2024)  Social Connections: Socially Isolated (09/03/2024)  Tobacco Use: Medium Risk (07/19/2024)     Readmission Risk Interventions    07/05/2024   10:24 AM  Readmission Risk Prevention Plan  Transportation Screening Complete  HRI or Home Care Consult Complete  Social Work Consult for Recovery Care Planning/Counseling Complete  Palliative Care Screening Not Applicable  Medication Review [employer]) Complete

## 2024-09-04 NOTE — Evaluation (Signed)
 Physical Therapy Evaluation Patient Details Name: Jenny [name] MRN: [id number] DOB: [birth year] Today's Date: 09/04/2024  History of Present Illness  89 yo F adm 09/02/24 for SOB and CHF exacerbation. PMH: HFpEF, CKD, HTN, T2DM, COPD, Afib on Eliquis , CAD, HLD, glaucoma, obese.  Clinical Impression  Pt presents to PT with deficits in activity tolerance, strength, gait, pulmonary function. Pt is able to ambulate for household distances with notable tachypnea although SpO2 remains stable while on room air. Pt expresses frustration with multiple recent admissions due to breathing difficulties and wonders if her cats may be contributing to her troubles. Pt is not open to consideration of short term inpatient PT services. PT thus recommends continued HHPT at the time of discharge.        If plan is discharge home, recommend the following: A little help with bathing/dressing/bathroom;Assistance with cooking/housework;Assist for transportation;Help with stairs or ramp for entrance   Can travel by private vehicle        Equipment Recommendations None recommended by PT  Recommendations for Other Services       Functional Status Assessment Patient has had a recent decline in their functional status and demonstrates the ability to make significant improvements in function in a reasonable and predictable amount of time.     Precautions / Restrictions Precautions Precautions: Fall Recall of Precautions/Restrictions: Intact Precaution/Restrictions Comments: monitor SpO2 Restrictions Weight Bearing Restrictions Per Provider Order: No      Mobility  Bed Mobility                    Transfers Overall transfer level: Needs assistance Equipment used: Rollator (4 wheels) Transfers: Sit to/from Stand Sit to Stand: Supervision                Ambulation/Gait Ambulation/Gait assistance: Supervision Gait Distance (Feet): 100 Feet (100' x 2) Assistive device: Rollator (4  wheels) Gait Pattern/deviations: Step-through pattern, Trunk flexed Gait velocity: reduced Gait velocity interpretation: <1.8 ft/sec, indicate of risk for recurrent falls   General Gait Details: pt wth slowed step-through gait, increased trunk flexion  Stairs            Wheelchair Mobility     Tilt Bed    Modified Rankin (Stroke Patients Only)       Balance Overall balance assessment: Needs assistance Sitting-balance support: No upper extremity supported, Feet supported Sitting balance-Leahy Scale: Good     Standing balance support: Single extremity supported, Reliant on assistive device for balance Standing balance-Leahy Scale: Poor                               Pertinent Vitals/Pain Pain Assessment Pain Assessment: No/denies pain    Home Living Family/patient expects to be discharged to:: Private residence Living Arrangements: Alone Available Help at Discharge: Family;Neighbor;Available PRN/intermittently Type of Home: House Home Access: Ramped entrance       Home Layout: One level Home Equipment: Rollator (4 wheels);BSC/3in1;Tub bench;Grab bars - tub/shower      Prior Function Prior Level of Function : Independent/Modified Independent             Mobility Comments: ambulatory for short household distances with rollator ADLs Comments: ind     Extremity/Trunk Assessment   Upper Extremity Assessment Upper Extremity Assessment: Overall WFL for tasks assessed    Lower Extremity Assessment Lower Extremity Assessment: Generalized weakness    Cervical / Trunk Assessment Cervical / Trunk Assessment: Other exceptions Cervical / Trunk  Exceptions: body habitus  Communication   Communication Communication: No apparent difficulties    Cognition Arousal: Alert Behavior During Therapy: WFL for tasks assessed/performed   PT - Cognitive impairments: No apparent impairments                         Following commands:  Intact       Cueing Cueing Techniques: Verbal cues     General Comments General comments (skin integrity, edema, etc.): VSS on RA, SpO2 ranging from 92-99% when mobilizing. PT does note tachypnea with activity, pt utilizes pursed lip breathing well    Exercises     Assessment/Plan    PT Assessment Patient needs continued PT services  PT Problem List Decreased strength;Decreased activity tolerance;Decreased balance;Decreased mobility;Cardiopulmonary status limiting activity       PT Treatment Interventions Gait training;DME instruction;Stair training;Functional mobility training;Therapeutic activities;Therapeutic exercise;Balance training;Neuromuscular re-education;Patient/family education    PT Goals (Current goals can be found in the Care Plan section)  Acute Rehab PT Goals Patient Stated Goal: to return home, improve pulmonary function PT Goal Formulation: With patient Time For Goal Achievement: 09/18/24 Potential to Achieve Goals: Fair Additional Goals Additional Goal #1: Pt will ambulate for >150', reporting 1/4 DOE or less, to demonstrate improved tolerance for household ambulation    Frequency Min 2X/week     Co-evaluation               AM-PAC PT 6 Clicks Mobility  Outcome Measure Help needed turning from your back to your side while in a flat bed without using bedrails?: None Help needed moving from lying on your back to sitting on the side of a flat bed without using bedrails?: A Little Help needed moving to and from a bed to a chair (including a wheelchair)?: A Little Help needed standing up from a chair using your arms (e.g., wheelchair or bedside chair)?: A Little Help needed to walk in hospital room?: A Little Help needed climbing 3-5 steps with a railing? : A Lot 6 Click Score: 18    End of Session   Activity Tolerance: Patient limited by fatigue Patient left: in bed;with call bell/phone within reach Nurse Communication: Mobility status PT Visit  Diagnosis: Other abnormalities of gait and mobility (R26.89);Muscle weakness (generalized) (M62.81)    Time: 8776-8761 PT Time Calculation (min) (ACUTE ONLY): 15 min   Charges:   PT Evaluation $PT Eval Low Complexity: 1 Low   PT General Charges $$ ACUTE PT VISIT: 1 Visit         [name], PT, DPT Acute Rehabilitation Office [phone]   [name] 09/04/2024, 12:56 PM

## 2024-09-04 NOTE — TOC CM/SW Note (Signed)
 Referral received to assist pt with [facility] RN, PT, OT, aide. Met with pt to discuss HH. Pt reports that prior to this admission she was receiving HH therapy and nursing with [facility]. Contacted Heather at [facility] and she confirmed that pt is active with them for nursing and therapy. Informed [facility] that pt needs a HH aide and pt has been DC today.

## 2024-09-04 NOTE — Progress Notes (Signed)
 Patient is ready for discharge. Patient IV has been removed without complication and she has been taken off telemetry, CCMD has been notified. Patient discharge has been discussed and reviewed with her and son and all questions have been answered. RX have been picked up at [facility] pharmacy. Patient will meet her son at the front entrance of the hospital for transport home.

## 2024-09-04 NOTE — Plan of Care (Signed)
 I was asked by nursing staff that son wants to meet the provider again.  I had a lengthy discussion on phone with son earlier and he was frustrated that his mother is very difficult to handle lady and she does not follow the recommendations and keep getting sick.  She does not want to go to a nursing home or assisted living either.  Not sure how we can help.  I met again the son and the patient in the room.  He was again very frustrated and keep telling me that he is not pleased with the care I provided and he does not understand how her mother is back to baseline that quickly.  Apparently from someone in EMS or home health nurse told him on Thursday that if she does not go to the hospital she will die, per son he does not understand from imminent death to how she can recover that quickly and we are discharging her.  Patient was admitted with concern of COPD exacerbation, initially requiring oxygen but responded very quickly to medical management with steroid, she was weaned back to room air overnight.  She was even medically stable yesterday but was feeling weak so I kept her for another day.  She does not need any IV medications.  She is at her baseline of 89 year old lady, PT also evaluated her and recommended home health.  I ordered maximum home health services as she lives alone.  Son does not want her to be discharged that quickly.  Not sure how much we can help more as patient is 89 year old lady with multiple comorbidities and difficult behavior.  Son was offered to appeal this discharge but in my medical opinion patient is ready to go back home and need to have a close follow-up with her outpatient providers for further assistance.

## 2024-10-06 ENCOUNTER — Other Ambulatory Visit: Payer: Self-pay

## 2024-10-06 ENCOUNTER — Encounter (HOSPITAL_COMMUNITY): Payer: Self-pay | Admitting: Internal Medicine

## 2024-10-06 ENCOUNTER — Emergency Department (HOSPITAL_COMMUNITY)

## 2024-10-06 ENCOUNTER — Inpatient Hospital Stay (HOSPITAL_COMMUNITY)
Admission: EM | Admit: 2024-10-06 | Discharge: 2024-10-09 | DRG: 191 | Disposition: A | Discharge status: Skilled Nursing Facility | Attending: Internal Medicine | Admitting: Internal Medicine

## 2024-10-06 DIAGNOSIS — I10 Essential (primary) hypertension: Secondary | ICD-10-CM | POA: Diagnosis not present

## 2024-10-06 DIAGNOSIS — E1151 Type 2 diabetes mellitus with diabetic peripheral angiopathy without gangrene: Secondary | ICD-10-CM | POA: Diagnosis present

## 2024-10-06 DIAGNOSIS — I495 Sick sinus syndrome: Secondary | ICD-10-CM | POA: Diagnosis present

## 2024-10-06 DIAGNOSIS — Z955 Presence of coronary angioplasty implant and graft: Secondary | ICD-10-CM

## 2024-10-06 DIAGNOSIS — E861 Hypovolemia: Secondary | ICD-10-CM | POA: Diagnosis present

## 2024-10-06 DIAGNOSIS — I4819 Other persistent atrial fibrillation: Secondary | ICD-10-CM | POA: Diagnosis present

## 2024-10-06 DIAGNOSIS — Z91048 Other nonmedicinal substance allergy status: Secondary | ICD-10-CM

## 2024-10-06 DIAGNOSIS — N184 Chronic kidney disease, stage 4 (severe): Secondary | ICD-10-CM | POA: Diagnosis present

## 2024-10-06 DIAGNOSIS — T380X5A Adverse effect of glucocorticoids and synthetic analogues, initial encounter: Secondary | ICD-10-CM | POA: Diagnosis present

## 2024-10-06 DIAGNOSIS — I252 Old myocardial infarction: Secondary | ICD-10-CM

## 2024-10-06 DIAGNOSIS — E1122 Type 2 diabetes mellitus with diabetic chronic kidney disease: Secondary | ICD-10-CM | POA: Diagnosis present

## 2024-10-06 DIAGNOSIS — D631 Anemia in chronic kidney disease: Secondary | ICD-10-CM | POA: Diagnosis present

## 2024-10-06 DIAGNOSIS — J441 Chronic obstructive pulmonary disease with (acute) exacerbation: Secondary | ICD-10-CM | POA: Diagnosis present

## 2024-10-06 DIAGNOSIS — I2489 Other forms of acute ischemic heart disease: Secondary | ICD-10-CM | POA: Diagnosis present

## 2024-10-06 DIAGNOSIS — E66812 Obesity, class 2: Secondary | ICD-10-CM | POA: Diagnosis present

## 2024-10-06 DIAGNOSIS — E860 Dehydration: Secondary | ICD-10-CM | POA: Diagnosis present

## 2024-10-06 DIAGNOSIS — I5032 Chronic diastolic (congestive) heart failure: Secondary | ICD-10-CM | POA: Diagnosis present

## 2024-10-06 DIAGNOSIS — Z66 Do not resuscitate: Secondary | ICD-10-CM | POA: Diagnosis present

## 2024-10-06 DIAGNOSIS — I480 Paroxysmal atrial fibrillation: Secondary | ICD-10-CM | POA: Diagnosis not present

## 2024-10-06 DIAGNOSIS — J309 Allergic rhinitis, unspecified: Secondary | ICD-10-CM | POA: Diagnosis present

## 2024-10-06 DIAGNOSIS — K219 Gastro-esophageal reflux disease without esophagitis: Secondary | ICD-10-CM | POA: Diagnosis present

## 2024-10-06 DIAGNOSIS — Z881 Allergy status to other antibiotic agents status: Secondary | ICD-10-CM

## 2024-10-06 DIAGNOSIS — Z1152 Encounter for screening for COVID-19: Secondary | ICD-10-CM

## 2024-10-06 DIAGNOSIS — Z79899 Other long term (current) drug therapy: Secondary | ICD-10-CM

## 2024-10-06 DIAGNOSIS — Z8249 Family history of ischemic heart disease and other diseases of the circulatory system: Secondary | ICD-10-CM

## 2024-10-06 DIAGNOSIS — E876 Hypokalemia: Secondary | ICD-10-CM | POA: Diagnosis present

## 2024-10-06 DIAGNOSIS — I130 Hypertensive heart and chronic kidney disease with heart failure and stage 1 through stage 4 chronic kidney disease, or unspecified chronic kidney disease: Secondary | ICD-10-CM | POA: Diagnosis present

## 2024-10-06 DIAGNOSIS — E785 Hyperlipidemia, unspecified: Secondary | ICD-10-CM | POA: Diagnosis present

## 2024-10-06 DIAGNOSIS — Z888 Allergy status to other drugs, medicaments and biological substances status: Secondary | ICD-10-CM

## 2024-10-06 DIAGNOSIS — R531 Weakness: Secondary | ICD-10-CM | POA: Diagnosis not present

## 2024-10-06 DIAGNOSIS — E871 Hypo-osmolality and hyponatremia: Secondary | ICD-10-CM | POA: Diagnosis present

## 2024-10-06 DIAGNOSIS — Z7901 Long term (current) use of anticoagulants: Secondary | ICD-10-CM | POA: Diagnosis not present

## 2024-10-06 DIAGNOSIS — N179 Acute kidney failure, unspecified: Secondary | ICD-10-CM | POA: Diagnosis present

## 2024-10-06 DIAGNOSIS — I2510 Atherosclerotic heart disease of native coronary artery without angina pectoris: Secondary | ICD-10-CM | POA: Diagnosis present

## 2024-10-06 DIAGNOSIS — D72829 Elevated white blood cell count, unspecified: Secondary | ICD-10-CM | POA: Diagnosis present

## 2024-10-06 DIAGNOSIS — Z6834 Body mass index (BMI) 34.0-34.9, adult: Secondary | ICD-10-CM

## 2024-10-06 DIAGNOSIS — Z7952 Long term (current) use of systemic steroids: Secondary | ICD-10-CM

## 2024-10-06 DIAGNOSIS — Z886 Allergy status to analgesic agent status: Secondary | ICD-10-CM

## 2024-10-06 DIAGNOSIS — Z87891 Personal history of nicotine dependence: Secondary | ICD-10-CM

## 2024-10-06 DIAGNOSIS — Z91011 Allergy to milk products, unspecified: Secondary | ICD-10-CM

## 2024-10-06 LAB — BASIC METABOLIC PANEL WITH GFR
Anion gap: 21 — ABNORMAL HIGH (ref 5–15)
BUN: 88 mg/dL — ABNORMAL HIGH (ref 8–23)
CO2: 33 mmol/L — ABNORMAL HIGH (ref 22–32)
Calcium: 7.4 mg/dL — ABNORMAL LOW (ref 8.9–10.3)
Chloride: 76 mmol/L — ABNORMAL LOW (ref 98–111)
Creatinine, Ser: 2.1 mg/dL — ABNORMAL HIGH (ref 0.44–1.00)
GFR, Estimated: 22 mL/min — ABNORMAL LOW (ref >60)
Glucose, Bld: 167 mg/dL — ABNORMAL HIGH (ref 70–99)
Potassium: 2.4 mmol/L — CL (ref 3.5–5.1)
Sodium: 130 mmol/L — ABNORMAL LOW (ref 135–145)

## 2024-10-06 LAB — TROPONIN I (HIGH SENSITIVITY)
Troponin I (High Sensitivity): 82 ng/L — ABNORMAL HIGH (ref <18)
Troponin I (High Sensitivity): 79 ng/L — ABNORMAL HIGH (ref <18)

## 2024-10-06 LAB — I-STAT CHEM 8, ED
BUN: 86 mg/dL — ABNORMAL HIGH (ref 8–23)
Calcium, Ion: 0.84 mmol/L — CL (ref 1.15–1.40)
Chloride: 77 mmol/L — ABNORMAL LOW (ref 98–111)
Creatinine, Ser: 2.2 mg/dL — ABNORMAL HIGH (ref 0.44–1.00)
Glucose, Bld: 177 mg/dL — ABNORMAL HIGH (ref 70–99)
HCT: 38 % (ref 36.0–46.0)
Hemoglobin: 12.9 g/dL (ref 12.0–15.0)
Potassium: 2.2 mmol/L — CL (ref 3.5–5.1)
Sodium: 126 mmol/L — ABNORMAL LOW (ref 135–145)
TCO2: 42 mmol/L — ABNORMAL HIGH (ref 22–32)

## 2024-10-06 LAB — PHOSPHORUS: Phosphorus: 5.4 mg/dL — ABNORMAL HIGH (ref 2.5–4.6)

## 2024-10-06 LAB — RESP PANEL BY RT-PCR (RSV, FLU A&B, COVID)  RVPGX2
Influenza A by PCR: NEGATIVE
Influenza B by PCR: NEGATIVE
Resp Syncytial Virus by PCR: NEGATIVE
SARS Coronavirus 2 by RT PCR: NEGATIVE

## 2024-10-06 LAB — CBC WITH DIFFERENTIAL/PLATELET
Abs Immature Granulocytes: 0.06 K/uL (ref 0.00–0.07)
Basophils Absolute: 0 K/uL (ref 0.0–0.1)
Basophils Relative: 0 %
Eosinophils Absolute: 0 K/uL (ref 0.0–0.5)
Eosinophils Relative: 0 %
HCT: 36.9 % (ref 36.0–46.0)
Hemoglobin: 11.8 g/dL — ABNORMAL LOW (ref 12.0–15.0)
Immature Granulocytes: 1 %
Lymphocytes Relative: 4 %
Lymphs Abs: 0.4 K/uL — ABNORMAL LOW (ref 0.7–4.0)
MCH: 26.4 pg (ref 26.0–34.0)
MCHC: 32 g/dL (ref 30.0–36.0)
MCV: 82.6 fL (ref 80.0–100.0)
Monocytes Absolute: 0.6 K/uL (ref 0.1–1.0)
Monocytes Relative: 7 %
Neutro Abs: 8.4 K/uL — ABNORMAL HIGH (ref 1.7–7.7)
Neutrophils Relative %: 88 %
Platelets: 265 K/uL (ref 150–400)
RBC: 4.47 MIL/uL (ref 3.87–5.11)
RDW: 17.2 % — ABNORMAL HIGH (ref 11.5–15.5)
WBC: 9.6 K/uL (ref 4.0–10.5)
nRBC: 0 % (ref 0.0–0.2)

## 2024-10-06 LAB — URINALYSIS, W/ REFLEX TO CULTURE (INFECTION SUSPECTED)
Bacteria, UA: NONE SEEN
Bilirubin Urine: NEGATIVE
Glucose, UA: NEGATIVE mg/dL
Ketones, ur: NEGATIVE mg/dL
Leukocytes,Ua: NEGATIVE
Nitrite: NEGATIVE
Protein, ur: NEGATIVE mg/dL
Specific Gravity, Urine: 1.008 (ref 1.005–1.030)
pH: 6 (ref 5.0–8.0)

## 2024-10-06 LAB — MAGNESIUM: Magnesium: 2.8 mg/dL — ABNORMAL HIGH (ref 1.7–2.4)

## 2024-10-06 LAB — BRAIN NATRIURETIC PEPTIDE: B Natriuretic Peptide: 138.7 pg/mL — ABNORMAL HIGH (ref 0.0–100.0)

## 2024-10-06 LAB — CBG MONITORING, ED: Glucose-Capillary: 234 mg/dL — ABNORMAL HIGH (ref 70–99)

## 2024-10-06 MED ORDER — PANTOPRAZOLE SODIUM 40 MG PO TBEC
40.0000 mg | DELAYED_RELEASE_TABLET | Freq: Every day | ORAL | Status: DC
  Administered 2024-10-07 – 2024-10-09 (×3): 40 mg via ORAL
  Filled 2024-10-06 (×3): qty 1

## 2024-10-06 MED ORDER — GABAPENTIN 100 MG PO CAPS: 100.0000 mg | ORAL_CAPSULE | ORAL | Status: DC

## 2024-10-06 MED ORDER — POTASSIUM CHLORIDE 10 MEQ/100ML IV SOLN
10.0000 meq | INTRAVENOUS | Status: DC
Start: 2024-10-06 — End: 2024-10-06

## 2024-10-06 MED ORDER — INSULIN ASPART 100 UNIT/ML IJ SOLN
0.0000 [IU] | Freq: Three times a day (TID) | INTRAMUSCULAR | Status: DC
  Administered 2024-10-07: 4 [IU] via SUBCUTANEOUS
  Administered 2024-10-07: 2 [IU] via SUBCUTANEOUS
  Administered 2024-10-07 – 2024-10-08 (×2): 3 [IU] via SUBCUTANEOUS
  Administered 2024-10-08 (×2): 1 [IU] via SUBCUTANEOUS
  Administered 2024-10-09: 4 [IU] via SUBCUTANEOUS

## 2024-10-06 MED ORDER — IPRATROPIUM-ALBUTEROL 0.5-2.5 (3) MG/3ML IN SOLN: 3.0000 mL | RESPIRATORY_TRACT | Status: DC | PRN

## 2024-10-06 MED ORDER — POTASSIUM CHLORIDE 10 MEQ/100ML IV SOLN
10.0000 meq | INTRAVENOUS | Status: DC
Start: 2024-10-06 — End: 2024-10-06
  Administered 2024-10-06: 10 meq via INTRAVENOUS
  Filled 2024-10-06: qty 100

## 2024-10-06 MED ORDER — ACETAMINOPHEN 325 MG PO TABS
650.0000 mg | ORAL_TABLET | Freq: Four times a day (QID) | ORAL | Status: DC | PRN
  Administered 2024-10-08 – 2024-10-09 (×2): 650 mg via ORAL
  Filled 2024-10-06 (×2): qty 2

## 2024-10-06 MED ORDER — ACETAMINOPHEN 650 MG RE SUPP: 650.0000 mg | Freq: Four times a day (QID) | RECTAL | Status: DC | PRN

## 2024-10-06 MED ORDER — APIXABAN 2.5 MG PO TABS
2.5000 mg | ORAL_TABLET | Freq: Two times a day (BID) | ORAL | Status: DC
  Administered 2024-10-06 – 2024-10-09 (×6): 2.5 mg via ORAL
  Filled 2024-10-06 (×6): qty 1

## 2024-10-06 MED ORDER — INSULIN ASPART 100 UNIT/ML IJ SOLN
0.0000 [IU] | Freq: Every day | INTRAMUSCULAR | Status: DC
  Administered 2024-10-06 – 2024-10-07 (×2): 2 [IU] via SUBCUTANEOUS

## 2024-10-06 MED ORDER — POTASSIUM CHLORIDE CRYS ER 20 MEQ PO TBCR: 40.0000 meq | EXTENDED_RELEASE_TABLET | Freq: Four times a day (QID) | ORAL | Status: DC

## 2024-10-06 MED ORDER — HYDRALAZINE HCL 20 MG/ML IJ SOLN: 5.0000 mg | INTRAMUSCULAR | Status: DC | PRN

## 2024-10-06 MED ORDER — POTASSIUM CHLORIDE CRYS ER 20 MEQ PO TBCR
40.0000 meq | EXTENDED_RELEASE_TABLET | ORAL | Status: AC
  Administered 2024-10-06 – 2024-10-07 (×3): 40 meq via ORAL
  Filled 2024-10-06 (×3): qty 2

## 2024-10-06 MED ORDER — GABAPENTIN 100 MG PO CAPS
100.0000 mg | ORAL_CAPSULE | Freq: Every morning | ORAL | Status: DC
  Administered 2024-10-07 – 2024-10-09 (×3): 100 mg via ORAL
  Filled 2024-10-06 (×3): qty 1

## 2024-10-06 MED ORDER — HYDROXYZINE HCL 25 MG PO TABS: 25.0000 mg | ORAL_TABLET | Freq: Three times a day (TID) | ORAL | Status: DC | PRN

## 2024-10-06 MED ORDER — ARFORMOTEROL TARTRATE 15 MCG/2ML IN NEBU
15.0000 ug | INHALATION_SOLUTION | Freq: Two times a day (BID) | RESPIRATORY_TRACT | Status: DC
  Administered 2024-10-06 – 2024-10-09 (×6): 15 ug via RESPIRATORY_TRACT
  Filled 2024-10-06 (×6): qty 2

## 2024-10-06 MED ORDER — METHYLPREDNISOLONE SODIUM SUCC 125 MG IJ SOLR
125.0000 mg | Freq: Once | INTRAMUSCULAR | Status: AC
  Administered 2024-10-06: 125 mg via INTRAVENOUS
  Filled 2024-10-06: qty 2

## 2024-10-06 MED ORDER — METHYLPREDNISOLONE SODIUM SUCC 125 MG IJ SOLR
125.0000 mg | Freq: Every day | INTRAMUSCULAR | Status: DC
  Administered 2024-10-07: 125 mg via INTRAVENOUS
  Filled 2024-10-06: qty 2

## 2024-10-06 MED ORDER — DORZOLAMIDE HCL 2 % OP SOLN
1.0000 [drp] | Freq: Two times a day (BID) | OPHTHALMIC | Status: DC
  Administered 2024-10-06 – 2024-10-09 (×6): 1 [drp] via OPHTHALMIC
  Filled 2024-10-06: qty 10

## 2024-10-06 MED ORDER — UMECLIDINIUM BROMIDE 62.5 MCG/ACT IN AEPB
1.0000 | INHALATION_SPRAY | Freq: Every day | RESPIRATORY_TRACT | Status: DC
  Administered 2024-10-07 – 2024-10-09 (×3): 1 via RESPIRATORY_TRACT
  Filled 2024-10-06: qty 7

## 2024-10-06 MED ORDER — POTASSIUM CHLORIDE CRYS ER 20 MEQ PO TBCR
40.0000 meq | EXTENDED_RELEASE_TABLET | Freq: Once | ORAL | Status: AC
  Administered 2024-10-06: 40 meq via ORAL
  Filled 2024-10-06: qty 2

## 2024-10-06 MED ORDER — GABAPENTIN 100 MG PO CAPS
200.0000 mg | ORAL_CAPSULE | Freq: Every day | ORAL | Status: DC
  Administered 2024-10-06 – 2024-10-08 (×3): 200 mg via ORAL
  Filled 2024-10-06 (×3): qty 2

## 2024-10-06 MED ORDER — LACTATED RINGERS IV BOLUS
500.0000 mL | Freq: Once | INTRAVENOUS | Status: AC
  Administered 2024-10-06: 500 mL via INTRAVENOUS

## 2024-10-06 MED ORDER — POTASSIUM CHLORIDE IN NACL 40-0.9 MEQ/L-% IV SOLN
INTRAVENOUS | Status: DC
  Filled 2024-10-06 (×2): qty 1000

## 2024-10-06 MED ORDER — ROSUVASTATIN CALCIUM 20 MG PO TABS
20.0000 mg | ORAL_TABLET | Freq: Every day | ORAL | Status: DC
  Administered 2024-10-07 – 2024-10-09 (×3): 20 mg via ORAL
  Filled 2024-10-06 (×3): qty 1

## 2024-10-06 MED ORDER — NITROGLYCERIN 0.4 MG SL SUBL: 0.4000 mg | SUBLINGUAL_TABLET | SUBLINGUAL | Status: DC | PRN

## 2024-10-06 MED ORDER — LATANOPROST 0.005 % OP SOLN
1.0000 [drp] | Freq: Every day | OPHTHALMIC | Status: DC
  Administered 2024-10-06 – 2024-10-08 (×3): 1 [drp] via OPHTHALMIC
  Filled 2024-10-06: qty 2.5

## 2024-10-06 MED ORDER — IPRATROPIUM-ALBUTEROL 0.5-2.5 (3) MG/3ML IN SOLN
3.0000 mL | Freq: Once | RESPIRATORY_TRACT | Status: AC
  Administered 2024-10-06: 3 mL via RESPIRATORY_TRACT
  Filled 2024-10-06: qty 3

## 2024-10-06 NOTE — H&P (Signed)
 [facility] H&P   Patient Demographics:    Jenny [name], is a 89 y.o. female  MRN: [id number]   DOB - 01/22/1933  Admit Date - 10/06/2024  Outpatient Primary MD for the patient is Schmerge, Rosaline NOVAK, NP    Patient coming from: Home  Chief Complaint  Patient presents with   Shortness of Breath   Dizziness      HPI:    Jenny [name]  is a 89 y.o. female, with medical history significant of HFpEF, hypertension, persistent A-fib on Eliquis , tachybradycardia syndrome, hyperlipidemia, CAD, COPD, CKD stage IV, type 2 diabetes, anemia, GERD, class II obesity presents, patient with recent hospitalization due to COPD exacerbation, requiring BiPAP and oxygen, but she was discharged on room air . - Presents today with multiple complaints, dyspnea, progressive, initially at activity, currently with rest, as well she presents with dizziness, lightheadedness, tingling, numbness and lower extremity spasms, patient reports she has been taking her Lasix , but did forget to take her potassium supplement with it, as well she reports poor appetite, generalized weakness and fatigue, unable to get up from the couch. - In ED he is with significant wheezing, improved with nebulizer and steroids, labs significant for severe hypokalemia 2.2, low sodium at 126, AKI with creatinine up at 2.2, [facility] hospitalist consulted to admit.    Review of systems:     A full 10 point Review of Systems was done, except as stated above, all other Review of Systems were negative.   With Past History of the following :    Past Medical History:  Diagnosis Date   ACUT DUOD ULCER W/HEMORR&PERF W/O MENTION OBST 10/05/2009   NSAID induced   ALLERGIC RHINITIS CAUSE UNSPECIFIED    ANEMIA-NOS    CAD (coronary artery disease) 06/08/2009   DEs RCA with 70% LAD and EF 60%   CHF (congestive heart failure) (HCC)    COPD    mild obst  on PFTs 03/2010   Diabetes mellitus 06/2010 dx   Mild, diet controlled   GLAUCOMA    HYPERLIPIDEMIA    HYPERTENSION, BENIGN    MYOCARDIAL INFARCTION 06/08/2009   des to rca   Persistent atrial fibrillation (HCC)    Dx 08/2021   TOBACCO ABUSE       Past Surgical History:  Procedure Laterality Date   CARDIOVERSION N/A 01/30/2022   Procedure: CARDIOVERSION;  Surgeon: Alveta Aleene PARAS, MD;  [facility] ENDOSCOPY;  Service: Cardiovascular;  Laterality: N/A;   HEMORRHOID SURGERY  1990   Right knee surgery        Social History:     Social History   Tobacco Use   Smoking status: Former    Current packs/day: 0.00    Average packs/day: 0.5 packs/day for 60.0 years (30.0 ttl pk-yrs)    Types: Cigarettes    Start date: 11/12/1961    Quit date: 11/12/2021    Years  since quitting: 2.9   Smokeless tobacco: Never   Tobacco comments:    She lives in [place] with her significant other (Charles Hook)0  Substance Use Topics   Alcohol use: Yes    Alcohol/week: 1.0 - 2.0 standard drink of alcohol    Types: 1 - 2 Glasses of wine per week    Comment: once or twice a year glass of wine 12/25/21        Family History :     Family History  Problem Relation Age of Onset   Hypertension Mother    Ulcers Mother    Stomach cancer Father    Stroke Maternal Grandmother    Heart attack Neg Hx       Home Medications:   Prior to Admission medications   Medication Sig Start Date End Date Taking? Authorizing Provider  acetaminophen  (TYLENOL ) 500 MG tablet Take 1,000 mg by mouth every 6 (six) hours as needed for moderate pain (pain score 4-6).   Yes [provider]  acetaminophen -codeine  (TYLENOL  #2) 300-15 MG tablet Take 1 tablet by mouth every 8 (eight) hours as needed for moderate pain (pain score 4-6). 09/25/24  Yes [provider]  apixaban  (ELIQUIS ) 2.5 MG TABS tablet Take 1 tablet (2.5 mg total) by mouth 2 (two) times daily. 11/10/23  Yes Weaver, Scott T, PA-C  dorzolamide   (TRUSOPT ) 2 % ophthalmic solution Place 1 drop into both eyes 2 (two) times daily.   Yes [provider]  furosemide  (LASIX ) 40 MG tablet Take 1 tablet (40 mg total) by mouth 2 (two) times daily. 07/05/24 10/06/24 Yes Sheikh, Omair Latif, DO  gabapentin  (NEURONTIN ) 100 MG capsule Take 100-200 mg by mouth See admin instructions. Take 1 capsule (100mg ) by mouth every morning and take 2 capsules (200mg ) by mouth at bedtime   Yes [provider]  hydrOXYzine (ATARAX) 25 MG tablet Take 25 mg by mouth every 8 (eight) hours as needed for itching. 06/06/22  Yes [provider]  ipratropium-albuterol  (DUONEB) 0.5-2.5 (3) MG/3ML SOLN Take 3 mLs by nebulization every 6 (six) hours as needed. 11/06/21  Yes Patel, Sona, MD  latanoprost  (XALATAN ) 0.005 % ophthalmic solution Place 1 drop into both eyes at bedtime.   Yes [provider]  metolazone  (ZAROXOLYN ) 2.5 MG tablet Take 1 tablet (2.5 mg total) by mouth as needed. 02/19/24 10/06/24 Yes Hackney, Ellouise LABOR, FNP  NITROSTAT  0.4 MG SL tablet DISSOLVE ONE TABLET UNDER TONGUE AS NEEDED FOR CHEST PAIN - MAY REPEAT TWICE-IF NO RELIEF GO TO NEAREST HOSPITAL ER 05/06/13  Yes Inocencio Berwyn LABOR, MD  nystatin  (MYCOSTATIN /NYSTOP ) powder Apply topically 3 (three) times daily. Patient taking differently: Apply 1 Application topically daily as needed (rash). 06/24/24  Yes Ezenduka, Nkeiruka J, MD  pantoprazole  (PROTONIX ) 40 MG tablet Take 1 tablet (40 mg total) by mouth daily. 10/14/23  Yes Weaver, Scott T, PA-C  potassium chloride  SA (KLOR-CON  M) 20 MEQ tablet 40meq BID X 2 days, then decrease to 20meq daily Patient taking differently: Take 20 mEq by mouth every evening. 08/24/24  Yes Hackney, Tina A, FNP  predniSONE  (DELTASONE ) 20 MG tablet Take 20 mg by mouth daily. 10/05/24  Yes [provider]  rosuvastatin  (CRESTOR ) 20 MG tablet Take 20 mg by mouth daily.   Yes [provider]  Tiotropium Bromide-Olodaterol (STIOLTO RESPIMAT)  2.5-2.5 MCG/ACT AERS Inhale 2 puffs into the lungs daily.   Yes [provider]  albuterol  (VENTOLIN  HFA) 108 (90 Base) MCG/ACT inhaler INHALE ONE PUFF EVERY  6 HOURS AS NEEDED FOR SHORTNESS OF BREATH Patient not taking: Reported on 10/06/2024 11/03/21   Claudene Arthea SQUIBB, MD     Allergies:     Allergies  Allergen Reactions   Aspirin  Other (See Comments)    Can take 81 mg not 325 mg -bleeding   Entresto  [Sacubitril -Valsartan ] Shortness Of Breath    COPD and reports increased SOB with Entresto    Atorvastatin  Itching and Rash    Itching and myaliga     Cyclobenzaprine      muscle relaxers-ulcer hemorrhage (hospitalized), ulcer hemorrhage   Lactose Intolerance (Gi) Other (See Comments)    GI upset   Meperidine Hcl Other (See Comments)    Not known   Pravastatin  Rash   Propoxyphene     Hallucination, hallucinations, vomiting   Wound Dressing Adhesive Other (See Comments)    red, stings     Physical Exam:   Vitals  Blood pressure (!) 121/57, pulse (!) 49, temperature 97.7 F (36.5 C), temperature source Oral, resp. rate 18, height 5' 2 (1.575 m), weight 85.2 kg, SpO2 100%.   1. General Frail elderly female, laying in bed in no apparent distress  2. Normal affect and insight, Not Suicidal or Homicidal, Awake Alert, Oriented X 3.  3. No F.N deficits, ALL C.Nerves Intact, Strength 5/5 all 4 extremities, Sensation intact all 4 extremities, Plantars down going.  4. Ears and Eyes appear Normal, Conjunctivae clear, PERRLA. Moist Oral Mucosa.  5. Supple Neck, No JVD, No cervical lymphadenopathy appriciated, No Carotid Bruits.  6. Symmetrical Chest wall movement, good air entry, minimal scattered wheezing  7.  Irregular r, bradycardic, No Gallops,   8. Positive Bowel Sounds, Abdomen Soft, No tenderness, No organomegaly appriciated,No rebound -guarding or rigidity.  9.  No Cyanosis, Normal Skin Turgor, chronic lower extremities skin changes  10. Good muscle tone,   joints appear normal , no effusions, Normal ROM.     Data Review:    CBC Recent Labs  Lab 10/06/24 1358 10/06/24 1434  WBC 9.6  --   HGB 11.8* 12.9  HCT 36.9 38.0  PLT 265  --   MCV 82.6  --   MCH 26.4  --   MCHC 32.0  --   RDW 17.2*  --   LYMPHSABS 0.4*  --   MONOABS 0.6  --   EOSABS 0.0  --   BASOSABS 0.0  --    ------------------------------------------------------------------------------------------------------------------  Chemistries  Recent Labs  Lab 10/06/24 1358 10/06/24 1434  NA 130* 126*  K 2.4* 2.2*  CL 76* 77*  CO2 33*  --   GLUCOSE 167* 177*  BUN 88* 86*  CREATININE 2.10* 2.20*  CALCIUM  7.4*  --    ------------------------------------------------------------------------------------------------------------------ estimated creatinine clearance is 17.5 mL/min (A) (by C-G formula based on SCr of 2.2 mg/dL (H)). ------------------------------------------------------------------------------------------------------------------ No results for input(s): TSH, T4TOTAL, T3FREE, THYROIDAB in the last 72 hours.  Invalid input(s): FREET3  Coagulation profile No results for input(s): INR, PROTIME in the last 168 hours. ------------------------------------------------------------------------------------------------------------------- No results for input(s): DDIMER in the last 72 hours. -------------------------------------------------------------------------------------------------------------------  Cardiac Enzymes No results for input(s): CKMB, TROPONINI, MYOGLOBIN in the last 168 hours.  Invalid input(s): CK ------------------------------------------------------------------------------------------------------------------    Component Value Date/Time   BNP 138.7 (H) 10/06/2024 1358     ---------------------------------------------------------------------------------------------------------------  Urinalysis    Component Value  Date/Time   COLORURINE YELLOW 07/01/2024 2102   APPEARANCEUR CLEAR 07/01/2024 2102   LABSPEC 1.014 07/01/2024 2102   PHURINE 6.0 07/01/2024 2102  GLUCOSEU 50 (A) 07/01/2024 2102   HGBUR NEGATIVE 07/01/2024 2102   BILIRUBINUR NEGATIVE 07/01/2024 2102   KETONESUR NEGATIVE 07/01/2024 2102   PROTEINUR 30 (A) 07/01/2024 2102   NITRITE NEGATIVE 07/01/2024 2102   LEUKOCYTESUR NEGATIVE 07/01/2024 2102    ----------------------------------------------------------------------------------------------------------------   Imaging Results:    DG Chest 1 View Result Date: 10/06/2024 EXAM: 1 VIEW(S) XRAY OF THE CHEST 10/06/2024 02:17:00 PM COMPARISON: 09/02/2024 CLINICAL HISTORY: sob. Reason for exam: sob; Per triage notes: PT to etc via [facility] from home with co sob that stared recently. PT has also had multiple falls recently. PT denies hitting her head but had been dizzy when she stands. PT says she also has tingling in her chest. FINDINGS: LUNGS AND PLEURA: No focal pulmonary opacity. Nodular density in right upper lung unchanged since 09/26/2023 favors a benign process. No pulmonary edema. No pleural effusion. No pneumothorax. HEART AND MEDIASTINUM: Cardiomegaly. Aortic calcification. BONES AND SOFT TISSUES: No acute osseous abnormality. IMPRESSION: 1. No acute cardiopulmonary process identified. Electronically signed by: Waddell Calk MD 10/06/2024 03:40 PM EDT RP Workstation: [id number]    EKG:  Vent. rate 52 BPM PR interval * ms QRS duration 94 ms QT/QTcB 472/439 ms P-R-T axes * -69 209 Atrial fibrillation Inferior infarct, age indeterminate Probable anterolateral infarct, age indeterm Compared with prior EKG from 09/02/2024   Assessment & Plan:    Principal Problem:   COPD exacerbation (HCC) Active Problems:   Tachycardia-bradycardia syndrome (HCC)   Hypokalemia   PAF (paroxysmal atrial fibrillation) (HCC)   Hypertension   Type II diabetes mellitus with peripheral circulatory  disorder (HCC)        COPD exacerbation - presents dyspnea and wheezing,. - X-ray with no evidence of pneumonia -Negative COVID/RSV/influenza -Continue with IV Solu-Medrol . - Continue with as needed nebs -Continue with home Incruse Ellipta    Hypokalemia -This in the setting she was taking her diuresis without taking potassium supplement -Need to monitor on telemetry. - Hold Lasix  and metolazone  -Continue with aggressive replacement with both p.o. and IV (she could not tolerate IV piggyback's, so we will start on NS with 40 of mEq potassium -Check potassium and magnesium  and replace if needed  Hyponatremia -Due to hypovolemia, continue with IV NS   Chronic heart failure with preserved ejection fraction (HFpEF) (HCC) -Volume depleted. - Hold home diuresis - Echocardiogram done in July with normal EF, mildly elevated pulmonaryarterial pressure, moderate biatrial dilatation and trivial MR, moderate MS.   AKI on CKD stage IV -Due to volume depletion and dehydration - Hold diuresis, continue with IV fluid -avoid nephrotoxic medication   CAD (coronary artery disease) Elevated troponins -Continue home Crestor  and Eliquis  -She denies any chest pain, elevated troponin in the setting of COPD exacerbation and AKI, non-ACS pattern 82> 79   Paroxysmal atrial fibrillation with slow ventricular response - Continue with home Eliquis  -Monitor on telemetry, not on any AV blocking agents   Diabetes mellitus -Recent A1c 6.6 -Not on any home medications -Keep on insulin  sliding scale as she will be on IV steroids    hyperlipidemia - Continue home statin   GERD (gastroesophageal reflux disease) - Continue Protonix     DVT Prophylaxis on Eliquis   AM Labs Ordered, also please review Full Orders  Family Communication: Admission, patients condition and plan of care including tests being ordered have been discussed with the patient who indicate understanding and agree with the plan and  Code Status.  Code Status DNR, confirmed by the patient  Likely DC to home  Consults called: None  Admission status: Inpatient  Time spent in minutes : 70 minutes   Brayton Lye M.D on 10/06/2024 at 5:42 PM   [facility] - Office  [phone]

## 2024-10-06 NOTE — ED Notes (Signed)
 IV team at bedside

## 2024-10-06 NOTE — ED Notes (Signed)
 Attempted new IV x2, pt is wanting a new IV for potassium to run through.

## 2024-10-06 NOTE — ED Triage Notes (Signed)
 PT to etc via [facility] from home with co sob that stared recently.  PT has also had multiple falls recently. PT denies hitting her head but had been dizzy when she stands.  PT says she also has tingling in her chest.  PT says when she is sitting she feels okay. PT does not wear any . Capillary blood glucose 210. oxygen baseline. BP140 /70. 20G LEFT AC. PT hy 40-60 per ems this is normal for her. PT has skin tears on both arms from recent falls.  PT takes Eliquis .

## 2024-10-06 NOTE — ED Notes (Signed)
 PT asking for IV potassium to be turned off. Provider notified.  Flushed line and inspected IV site to be sure everything was working properly.

## 2024-10-06 NOTE — ED Provider Notes (Signed)
 [place] [facility] AT [facility] Provider Note   CSN: [id number] Arrival date & time: 10/06/24  1340     Patient presents with: Shortness of Breath and Dizziness   Jenny [name] is a 89 y.o. female.   89 year old female presents for evaluation of shortness of breath and dizziness.  States every time she tries to get up she feel like she is going to faint.  States she gets a tingling her chest when this happens.  She has been more short of breath than usual, and has a history of COPD.  States she is not eating or drinking much and she has been too weak to get off the couch.  Denies any other symptoms or concerns.   Shortness of Breath Associated symptoms: no abdominal pain, no chest pain, no cough, no ear pain, no fever, no rash, no sore throat and no vomiting   Dizziness Associated symptoms: shortness of breath and weakness   Associated symptoms: no chest pain, no palpitations and no vomiting        Prior to Admission medications   Medication Sig Start Date End Date Taking? Authorizing Provider  acetaminophen  (TYLENOL ) 500 MG tablet Take 1,000 mg by mouth in the morning and at bedtime.    [provider]  acetaminophen -codeine  (TYLENOL  #2) 300-15 MG tablet Take 1 tablet by mouth every 8 (eight) hours as needed. 09/25/24   [provider]  albuterol  (VENTOLIN  HFA) 108 (90 Base) MCG/ACT inhaler INHALE ONE PUFF EVERY 6 HOURS AS NEEDED FOR SHORTNESS OF BREATH Patient not taking: Reported on 09/03/2024 11/03/21   Claudene Arthea SQUIBB, MD  apixaban  (ELIQUIS ) 2.5 MG TABS tablet Take 1 tablet (2.5 mg total) by mouth 2 (two) times daily. 11/10/23   Lelon Hamilton T, PA-C  dorzolamide  (TRUSOPT ) 2 % ophthalmic solution Place 1 drop into both eyes 2 (two) times daily.    [provider]  furosemide  (LASIX ) 40 MG tablet Take 1 tablet (40 mg total) by mouth 2 (two) times daily. 07/05/24 10/03/24  Sherrill Cable Latif, DO  gabapentin  (NEURONTIN ) 100 MG  capsule Take 100-200 mg by mouth See admin instructions. Take 1 capsule (100mg ) by mouth every morning and take 2 capsules (200mg ) by mouth at bedtime    [provider]  hydrOXYzine (ATARAX) 25 MG tablet Take 25 mg by mouth every 8 (eight) hours as needed for itching. 06/06/22   [provider]  ipratropium-albuterol  (DUONEB) 0.5-2.5 (3) MG/3ML SOLN Take 3 mLs by nebulization every 6 (six) hours as needed. 11/06/21   Patel, Sona, MD  latanoprost  (XALATAN ) 0.005 % ophthalmic solution Place 1 drop into both eyes at bedtime.    [provider]  metolazone  (ZAROXOLYN ) 2.5 MG tablet Take 1 tablet (2.5 mg total) by mouth as needed. 02/19/24 09/03/24  Donette Ellouise LABOR, FNP  NITROSTAT  0.4 MG SL tablet DISSOLVE ONE TABLET UNDER TONGUE AS NEEDED FOR CHEST PAIN - MAY REPEAT TWICE-IF NO RELIEF GO TO NEAREST HOSPITAL ER 05/06/13   Inocencio Berwyn LABOR, MD  nystatin  (MYCOSTATIN /NYSTOP ) powder Apply topically 3 (three) times daily. Patient taking differently: Apply 1 Application topically daily as needed (rash). 06/24/24   Ezenduka, Nkeiruka J, MD  pantoprazole  (PROTONIX ) 40 MG tablet Take 1 tablet (40 mg total) by mouth daily. 10/14/23   Lelon Hamilton T, PA-C  potassium chloride  SA (KLOR-CON  M) 20 MEQ tablet 40meq BID X 2 days, then decrease to 20meq daily Patient taking differently: Take 20 mEq by mouth every evening. 08/24/24  Donette City A, FNP  predniSONE  (DELTASONE ) 20 MG tablet Take 20 mg by mouth daily. 10/05/24   [provider]  rosuvastatin  (CRESTOR ) 20 MG tablet Take 20 mg by mouth daily.    [provider]  Tiotropium Bromide-Olodaterol (STIOLTO RESPIMAT) 2.5-2.5 MCG/ACT AERS Inhale 2 puffs into the lungs daily.    [provider]    Allergies: Aspirin , Entresto  [sacubitril -valsartan ], Atorvastatin , Cyclobenzaprine, Lactose intolerance (gi), Meperidine hcl, Pravastatin , Propoxyphene, and Wound dressing adhesive    Review of Systems  Constitutional:   Positive for fatigue. Negative for chills and fever.  HENT:  Negative for ear pain and sore throat.   Eyes:  Negative for pain and visual disturbance.  Respiratory:  Positive for shortness of breath. Negative for cough.   Cardiovascular:  Negative for chest pain and palpitations.  Gastrointestinal:  Negative for abdominal pain and vomiting.  Genitourinary:  Negative for dysuria and hematuria.  Musculoskeletal:  Negative for arthralgias and back pain.  Skin:  Negative for color change and rash.  Neurological:  Positive for dizziness and weakness. Negative for seizures and syncope.  All other systems reviewed and are negative.   Updated Vital Signs BP (!) 121/57 (BP Location: Right Arm)   Pulse (!) 49   Temp 97.7 F (36.5 C) (Oral)   Resp 18   Ht 5' 2 (1.575 m)   Wt 85.2 kg   SpO2 100%   BMI 34.37 kg/m   Physical Exam Vitals and nursing note reviewed.  Constitutional:      General: She is not in acute distress.    Appearance: She is well-developed. She is ill-appearing.     Comments: Chronically ill appearing  HENT:     Head: Normocephalic and atraumatic.  Eyes:     Conjunctiva/sclera: Conjunctivae normal.  Cardiovascular:     Rate and Rhythm: Regular rhythm. Bradycardia present.     Heart sounds: No murmur heard. Pulmonary:     Effort: Pulmonary effort is normal. No respiratory distress.     Breath sounds: Decreased breath sounds present.  Abdominal:     Palpations: Abdomen is soft.     Tenderness: There is no abdominal tenderness.  Musculoskeletal:        General: No swelling.     Cervical back: Neck supple.  Skin:    General: Skin is warm and dry.     Capillary Refill: Capillary refill takes less than 2 seconds.  Neurological:     Mental Status: She is alert.  Psychiatric:        Mood and Affect: Mood normal.     (all labs ordered are listed, but only abnormal results are displayed) Labs Reviewed  CBC WITH DIFFERENTIAL/PLATELET - Abnormal; Notable for the  following components:      Result Value   Hemoglobin 11.8 (*)    RDW 17.2 (*)    Neutro Abs 8.4 (*)    Lymphs Abs 0.4 (*)    All other components within normal limits  BRAIN NATRIURETIC PEPTIDE - Abnormal; Notable for the following components:   B Natriuretic Peptide 138.7 (*)    All other components within normal limits  I-STAT CHEM 8, ED - Abnormal; Notable for the following components:   Sodium 126 (*)    Potassium 2.2 (*)    Chloride 77 (*)    BUN 86 (*)    Creatinine, Ser 2.20 (*)    Glucose, Bld 177 (*)    Calcium , Ion 0.84 (*)    TCO2 42 (*)  All other components within normal limits  RESP PANEL BY RT-PCR (RSV, FLU A&B, COVID)  [id number]  BASIC METABOLIC PANEL WITH GFR  URINALYSIS, W/ REFLEX TO CULTURE (INFECTION SUSPECTED)  MAGNESIUM   TROPONIN I (HIGH SENSITIVITY)  TROPONIN I (HIGH SENSITIVITY)    EKG: EKG Interpretation Date/Time:  Wednesday October 06 2024 14:04:24 EDT Ventricular Rate:  52 PR Interval:    QRS Duration:  94 QT Interval:  472 QTC Calculation: 439 R Axis:   -69  Text Interpretation: Atrial fibrillation Inferior infarct, age indeterminate Probable anterolateral infarct, age indeterm Compared with prior EKG from 09/02/2024 Confirmed by Gennaro Bouchard ([id number]) on 10/06/2024 2:10:50 PM  Radiology: DG Chest 1 View Result Date: 10/06/2024 EXAM: 1 VIEW(S) XRAY OF THE CHEST 10/06/2024 02:17:00 PM COMPARISON: 09/02/2024 CLINICAL HISTORY: sob. Reason for exam: sob; Per triage notes: PT to etc via [facility] from home with co sob that stared recently. PT has also had multiple falls recently. PT denies hitting her head but had been dizzy when she stands. PT says she also has tingling in her chest. FINDINGS: LUNGS AND PLEURA: No focal pulmonary opacity. Nodular density in right upper lung unchanged since 09/26/2023 favors a benign process. No pulmonary edema. No pleural effusion. No pneumothorax. HEART AND MEDIASTINUM: Cardiomegaly. Aortic calcification. BONES  AND SOFT TISSUES: No acute osseous abnormality. IMPRESSION: 1. No acute cardiopulmonary process identified. Electronically signed by: Waddell Calk MD 10/06/2024 03:40 PM EDT RP Workstation: [id number]     Procedures   Medications Ordered in the ED  potassium chloride  10 mEq in 100 mL IVPB (10 mEq Intravenous New Bag/Given 10/06/24 1625)  potassium chloride  SA (KLOR-CON  M) CR tablet 40 mEq (has no administration in time range)  methylPREDNISolone  sodium succinate (SOLU-MEDROL ) 125 mg/2 mL injection 125 mg (has no administration in time range)  ipratropium-albuterol  (DUONEB) 0.5-2.5 (3) MG/3ML nebulizer solution 3 mL (3 mLs Nebulization Given 10/06/24 1519)  lactated ringers bolus 500 mL (500 mLs Intravenous New Bag/Given 10/06/24 1624)                                    Medical Decision Making Cardiac monitor interpretation: sinus bradycardia, no ectopy  Patient here for weakness.  Found to have hypokalemia and treated for COPD exacerbation.  Potassium replaced both IV and p.o. and given IV fluids.  This could be one of the reason she is bradycardic.  Magnesium  is pending at this time.  She appears very fatigued and somewhat dehydrated but is otherwise stable.  EKG does did show mild T wave flattening.  Patient will be admitted for these symptoms.  Waiting for hospitalist to call back.  Patient is agreeable with plan for admission.  Patient signed out to oncoming provider at 4:30 PM pending hospitalist consult for admission  Problems Addressed: COPD exacerbation [facility]): acute illness or injury that poses a threat to life or bodily functions Hypokalemia: acute illness or injury that poses a threat to life or bodily functions Weakness: undiagnosed new problem with uncertain prognosis  Amount and/or Complexity of Data Reviewed External Data Reviewed: notes.    Details: Prior ED records reviewed and patient admitted and then discharged 5-25 for respiratory distress Labs: ordered.  Decision-making details documented in ED Course.    Details: By me and patient has hypokalemia and some hyponatremia Radiology: ordered and independent interpretation performed. Decision-making details documented in ED Course.    Details: Ordered and interpreted By me independently of  radiology Chest x-ray: Shows no acute abnormality ECG/medicine tests: ordered and independent interpretation performed. Decision-making details documented in ED Course.    Details: And interpreted by me in the absence of cardiology and shows a sinus bradycardia, no STEMI or significant change compared to prior  Risk OTC drugs. Prescription drug management. Drug therapy requiring intensive monitoring for toxicity. Decision regarding hospitalization. Risk Details: CRITICAL CARE Performed by: Duwaine LITTIE Fusi   Total critical care time: 31 minutes  Critical care time was exclusive of separately billable procedures and treating other patients.  Critical care was necessary to treat or prevent imminent or life-threatening deterioration.  Critical care was time spent personally by me on the following activities: development of treatment plan with patient and/or surrogate as well as nursing, discussions with consultants, evaluation of patient's response to treatment, examination of patient, obtaining history from patient or surrogate, ordering and performing treatments and interventions, ordering and review of laboratory studies, ordering and review of radiographic studies, pulse oximetry and re-evaluation of patient's condition.   Critical Care Total time providing critical care: 31 minutes     Final diagnoses:  Hypokalemia  COPD exacerbation (HCC)  Weakness    ED Discharge Orders     None          Fusi Duwaine LITTIE, DO 10/06/24 1641

## 2024-10-06 NOTE — ED Notes (Signed)
Provider notified of potassium level

## 2024-10-06 NOTE — ED Notes (Signed)
 IV team put a new IV to try to run the potassium through. PT did not tolerate and refused the potassium at this time. PT educated.  Provider notified.

## 2024-10-06 NOTE — ED Notes (Signed)
 PT aware a urine sample is needed. PT declined a straight cath at this time.

## 2024-10-06 NOTE — ED Notes (Signed)
 Put pt on bedside commode, got mustard for cramps, repositioned in bed, changed bed linen, brought ginger ale to drink and offered blankets

## 2024-10-06 NOTE — ED Notes (Signed)
Pt transferred onto hospital bed.

## 2024-10-06 NOTE — H&P (Incomplete)
 PROGRESS NOTE    [name]  FMW:[id number] DOB: February 23, 1933 DOA: 10/06/2024 PCP: Ileen Rosaline NOVAK, NP    Chief Complaint  Patient presents with   Shortness of Breath   Dizziness    Brief Narrative:   Jenny [name] is a 89 y.o. female with medical history significant of HFpEF, hypertension, persistent A-fib on Eliquis , tachybradycardia syndrome, hyperlipidemia, CAD, COPD, CKD stage IV, type 2 diabetes, anemia, GERD, class II obesity presents to the ED via EMS for evaluation of shortness of breath and rhonchi/wheezing.  She was placed on CPAP by EMS and was given Solu-Medrol  125 mg, IV mag 2 g, and DuoNebs prior to arrival.  On arrival to the ED, patient was placed on BiPAP due to respiratory distress.    Labs with leukocytosis at 11.1, stable hemoglobin at 10.1, BNP 236, creatinine 1.8 (stable),  Chest x-ray showing no acute abnormalities.  Patient was given albuterol  neb, IV Decadron  10 mg, IV Lasix  20 mg, and azithromycin  in the ED.  Eventually taken off BiPAP.  Admitted for COPD exacerbation   9/26: Vital stable on room air, pseudohyponatremia with sodium at 132 and blood glucose of 247, creatinine 1.97 which remained around her baseline, leukocytosis resolved,   Assessment & Plan:   Principal Problem:   COPD exacerbation (HCC) Active Problems:   Tachycardia-bradycardia syndrome (HCC)   Hypokalemia   PAF (paroxysmal atrial fibrillation) (HCC)   Hypertension   Type II diabetes mellitus with peripheral circulatory disorder (HCC)    COPD exacerbation - presents dyspnea and wheezing,. - X-ray with no evidence of pneumonia -Negative COVID/RSV/influenza -Continue with IV Solu-Medrol . - Continue with as needed nebs -Continue with home Incruse Ellipta   Hypokalemia -This in the setting she was taking her diuresis without taking potassium supplement -Need to monitor on telemetry. - Hold Lasix  and metolazone  -Continue with aggressive replacement with both p.o. and  IV -BMP this evening -Check potassium and magnesium  and replace if needed   Chronic heart failure with preserved ejection fraction (HFpEF) (HCC) -Volume depleted. - Hold home diuresis - Echocardiogram done in July with normal EF, mildly elevated pulmonaryarterial pressure, moderate biatrial dilatation and trivial MR, moderate MS.  AKI on CKD stage IV -Due to volume depletion and dehydration - Hold diuresis, continue with IV fluid -avoid nephrotoxic medication   CAD (coronary artery disease) -Continue home Crestor  and Eliquis    Paroxysmal atrial fibrillation - Continue with home Eliquis    Diabetes mellitus -Recent A1c 6.6 -Not on any home medications -Keep on insulin  sliding scale as she will be on IV steroids  Other and unspecified hyperlipidemia - Continue home statin   GERD (gastroesophageal reflux disease) - Continue Protonix      DVT prophylaxis: (On Eliquis  Code Status: (DNR, confirmed by the patient Family Communication: (None at bedside Disposition:   Status is: Inpatient    Consultants:  None Subjective:   Objective: Vitals:   10/06/24 1354 10/06/24 1358  BP: (!) 121/57   Pulse: (!) 49   Resp: 18   Temp: 97.7 F (36.5 C)   TempSrc: Oral   SpO2: 100%   Weight:  85.2 kg  Height:  5' 2 (1.575 m)   No intake or output data in the 24 hours ending 10/06/24 1736 Filed Weights   10/06/24 1358  Weight: 85.2 kg    Examination:  General exam: Appears calm and comfortable  Respiratory system: Clear to auscultation. Respiratory effort normal. Cardiovascular system: S1 & S2 heard, RRR. No JVD, murmurs, rubs, gallops or clicks. No  pedal edema. Gastrointestinal system: Abdomen is nondistended, soft and nontender. No organomegaly or masses felt. Normal bowel sounds heard. Central nervous system: Alert and oriented. No focal neurological deficits. Extremities: Symmetric 5 x 5 power. Skin: No rashes, lesions or ulcers Psychiatry: Judgement and insight  appear normal. Mood & affect appropriate.     Data Reviewed: I have personally reviewed following labs and imaging studies  CBC: Recent Labs  Lab 10/06/24 1358 10/06/24 1434  WBC 9.6  --   NEUTROABS 8.4*  --   HGB 11.8* 12.9  HCT 36.9 38.0  MCV 82.6  --   PLT 265  --     Basic Metabolic Panel: Recent Labs  Lab 10/06/24 1358 10/06/24 1434  NA 130* 126*  K 2.4* 2.2*  CL 76* 77*  CO2 33*  --   GLUCOSE 167* 177*  BUN 88* 86*  CREATININE 2.10* 2.20*  CALCIUM  7.4*  --     GFR: Estimated Creatinine Clearance: 17.5 mL/min (A) (by C-G formula based on SCr of 2.2 mg/dL (H)).  Liver Function Tests: No results for input(s): AST, ALT, ALKPHOS, BILITOT, PROT, ALBUMIN in the last 168 hours.  CBG: No results for input(s): GLUCAP in the last 168 hours.   Recent Results (from the past 240 hours)  Resp panel by RT-PCR (RSV, Flu A&B, Covid) Anterior Nasal Swab     Status: None   Collection Time: 10/06/24  2:00 PM   Specimen: Anterior Nasal Swab  Result Value Ref Range Status   SARS Coronavirus 2 by RT PCR NEGATIVE NEGATIVE Final   Influenza A by PCR NEGATIVE NEGATIVE Final   Influenza B by PCR NEGATIVE NEGATIVE Final    Comment: (NOTE) The Xpert Xpress SARS-CoV-2/FLU/RSV plus assay is intended as an aid in the diagnosis of influenza from Nasopharyngeal swab specimens and should not be used as a sole basis for treatment. Nasal washings and aspirates are unacceptable for Xpert Xpress SARS-CoV-2/FLU/RSV testing.  Fact Sheet for Patients: bloggercourse.com  Fact Sheet for Healthcare Providers: seriousbroker.it  This test is not yet approved or cleared by the [place]  FDA and has been authorized for detection and/or diagnosis of SARS-CoV-2 by FDA under an Emergency Use Authorization (EUA). This EUA will remain in effect (meaning this test can be used) for the duration of the COVID-19 declaration under  Section 564(b)(1) of the Act, 21 U.S.C. section 360bbb-3(b)(1), unless the authorization is terminated or revoked.     Resp Syncytial Virus by PCR NEGATIVE NEGATIVE Final    Comment: (NOTE) Fact Sheet for Patients: bloggercourse.com  Fact Sheet for Healthcare Providers: seriousbroker.it  This test is not yet approved or cleared by the [place]  FDA and has been authorized for detection and/or diagnosis of SARS-CoV-2 by FDA under an Emergency Use Authorization (EUA). This EUA will remain in effect (meaning this test can be used) for the duration of the COVID-19 declaration under Section 564(b)(1) of the Act, 21 U.S.C. section 360bbb-3(b)(1), unless the authorization is terminated or revoked.  Performed at [facility], [address], [place], [place] [address]          Radiology Studies: DG Chest 1 View Result Date: 10/06/2024 EXAM: 1 VIEW(S) XRAY OF THE CHEST 10/06/2024 02:17:00 PM COMPARISON: 09/02/2024 CLINICAL HISTORY: sob. Reason for exam: sob; Per triage notes: PT to etc via [facility] from home with co sob that stared recently. PT has also had multiple falls recently. PT denies hitting her head but had been dizzy when she stands. PT  says she also has tingling in her chest. FINDINGS: LUNGS AND PLEURA: No focal pulmonary opacity. Nodular density in right upper lung unchanged since 09/26/2023 favors a benign process. No pulmonary edema. No pleural effusion. No pneumothorax. HEART AND MEDIASTINUM: Cardiomegaly. Aortic calcification. BONES AND SOFT TISSUES: No acute osseous abnormality. IMPRESSION: 1. No acute cardiopulmonary process identified. Electronically signed by: Waddell Calk MD 10/06/2024 03:40 PM EDT RP Workstation: [id number]        Scheduled Meds:  methylPREDNISolone  (SOLU-MEDROL ) injection  125 mg Intravenous Once   potassium chloride   40 mEq Oral Q6H   Continuous Infusions:  potassium chloride   10 mEq (10/06/24 1625)   potassium chloride        LOS: 0 days    Time spent: ***    Brayton Lye, MD [facility]   To contact the attending provider between 7A-7P or the covering provider during after hours 7P-7A, please log into the web site [email or web address] and access using universal [place] password for that web site. If you do not have the password, please call the hospital operator.  10/06/2024, 5:36 PM

## 2024-10-07 DIAGNOSIS — E1151 Type 2 diabetes mellitus with diabetic peripheral angiopathy without gangrene: Secondary | ICD-10-CM

## 2024-10-07 DIAGNOSIS — I480 Paroxysmal atrial fibrillation: Secondary | ICD-10-CM

## 2024-10-07 DIAGNOSIS — I10 Essential (primary) hypertension: Secondary | ICD-10-CM

## 2024-10-07 DIAGNOSIS — R531 Weakness: Secondary | ICD-10-CM

## 2024-10-07 DIAGNOSIS — J441 Chronic obstructive pulmonary disease with (acute) exacerbation: Secondary | ICD-10-CM | POA: Diagnosis not present

## 2024-10-07 DIAGNOSIS — E876 Hypokalemia: Secondary | ICD-10-CM | POA: Diagnosis not present

## 2024-10-07 LAB — BASIC METABOLIC PANEL WITH GFR
Anion gap: 20 — ABNORMAL HIGH (ref 5–15)
BUN: 75 mg/dL — ABNORMAL HIGH (ref 8–23)
CO2: 29 mmol/L (ref 22–32)
Calcium: 7.4 mg/dL — ABNORMAL LOW (ref 8.9–10.3)
Chloride: 83 mmol/L — ABNORMAL LOW (ref 98–111)
Creatinine, Ser: 2.06 mg/dL — ABNORMAL HIGH (ref 0.44–1.00)
GFR, Estimated: 23 mL/min — ABNORMAL LOW (ref >60)
Glucose, Bld: 317 mg/dL — ABNORMAL HIGH (ref 70–99)
Potassium: 3.5 mmol/L (ref 3.5–5.1)
Sodium: 132 mmol/L — ABNORMAL LOW (ref 135–145)

## 2024-10-07 LAB — CBC
HCT: 33.5 % — ABNORMAL LOW (ref 36.0–46.0)
Hemoglobin: 10.5 g/dL — ABNORMAL LOW (ref 12.0–15.0)
MCH: 26.3 pg (ref 26.0–34.0)
MCHC: 31.3 g/dL (ref 30.0–36.0)
MCV: 84 fL (ref 80.0–100.0)
Platelets: 242 K/uL (ref 150–400)
RBC: 3.99 MIL/uL (ref 3.87–5.11)
RDW: 17.2 % — ABNORMAL HIGH (ref 11.5–15.5)
WBC: 10 K/uL (ref 4.0–10.5)
nRBC: 0 % (ref 0.0–0.2)

## 2024-10-07 LAB — GLUCOSE, CAPILLARY
Glucose-Capillary: 302 mg/dL — ABNORMAL HIGH (ref 70–99)
Glucose-Capillary: 244 mg/dL — ABNORMAL HIGH (ref 70–99)
Glucose-Capillary: 215 mg/dL — ABNORMAL HIGH (ref 70–99)

## 2024-10-07 LAB — CBG MONITORING, ED: Glucose-Capillary: 264 mg/dL — ABNORMAL HIGH (ref 70–99)

## 2024-10-07 LAB — MRSA NEXT GEN BY PCR, NASAL: MRSA by PCR Next Gen: NOT DETECTED

## 2024-10-07 MED ORDER — INSULIN ASPART 100 UNIT/ML IJ SOLN
3.0000 [IU] | Freq: Three times a day (TID) | INTRAMUSCULAR | Status: DC
  Administered 2024-10-07 – 2024-10-09 (×4): 3 [IU] via SUBCUTANEOUS

## 2024-10-07 MED ORDER — POTASSIUM CHLORIDE CRYS ER 20 MEQ PO TBCR
40.0000 meq | EXTENDED_RELEASE_TABLET | Freq: Once | ORAL | Status: AC
  Administered 2024-10-07: 40 meq via ORAL
  Filled 2024-10-07: qty 2

## 2024-10-07 MED ORDER — PREDNISONE 20 MG PO TABS
60.0000 mg | ORAL_TABLET | Freq: Every day | ORAL | Status: DC
  Administered 2024-10-08 – 2024-10-09 (×2): 60 mg via ORAL
  Filled 2024-10-07 (×2): qty 3

## 2024-10-07 NOTE — TOC Initial Note (Signed)
 Transition of Care [facility]) - Initial/Assessment Note    Patient Details  Name: Jenny [name] MRN: [id number] Date of Birth: 08/31/1933  Transition of Care [facility]) CM/SW Contact:    Lauraine FORBES Saa, LCSWA Phone Number: 10/07/2024, 4:31 PM  Clinical Narrative:                  4:31 PM Per chart review, therapy recommended patient discharge to SNF, which patient was agreeable with. CSW sent patient's FL2 to SNFs in [facility]. Patient currently resides at home alone where she received HHPT/HHRN/HH aide through [facility]. Patient has recent outpatient palliative care history with [facility]. Patient has DME (RW, wheelchair) history. Patient has a PCP and insurance. Patient's preferred pharmacy's are [facility] and [facility]. CSW will continue to follow.  Expected Discharge Plan: Skilled Nursing Facility Barriers to Discharge: Continued Medical Work up, SNF Pending bed offer   Patient Goals and CMS Choice Patient states their goals for this hospitalization and ongoing recovery are:: SNF          Expected Discharge Plan and Services In-house Referral: Clinical Social Work   Post Acute Care Choice: Skilled Nursing Facility Living arrangements for the past 2 months: Single Family Home                                      Prior Living Arrangements/Services Living arrangements for the past 2 months: Single Family Home Lives with:: Self Patient language and need for interpreter reviewed:: Yes        Need for Family Participation in Patient Care: No (Comment) Care giver support system in place?: Yes (comment) Current home services: DME, Homehealth aide, Home RN, Home PT Criminal Activity/Legal Involvement Pertinent to Current Situation/Hospitalization: No - Comment as needed  Activities of Daily Living   ADL Screening (condition at time of admission) Independently performs ADLs?: Yes (appropriate for developmental  age) Is the patient deaf or have difficulty hearing?: Yes Does the patient have difficulty seeing, even when wearing glasses/contacts?: No Does the patient have difficulty concentrating, remembering, or making decisions?: No  Permission Sought/Granted Permission sought to share information with : Family Supports, Oceanographer granted to share information with : No (Contact information on chart)  Share Information with NAME: Magdeline Prange  Permission granted to share info w AGENCY: SNF  Permission granted to share info w Relationship: Son  Permission granted to share info w Contact Information: [phone]  Emotional Assessment       Orientation: : Oriented to Self, Oriented to Place, Oriented to  Time, Oriented to Situation Alcohol / Substance Use: Not Applicable Psych Involvement: No (comment)  Admission diagnosis:  Hypokalemia [E87.6] Weakness [R53.1] COPD exacerbation (HCC) [J44.1] Patient Active Problem List   Diagnosis Date Noted   Respiratory distress 09/04/2024   CKD (chronic kidney disease), stage IV (HCC) 09/03/2024   GERD (gastroesophageal reflux disease) 09/03/2024   History of atrial fibrillation 09/03/2024   Acute respiratory failure (HCC) 09/02/2024   Hyponatremia 09/02/2024   Hypokalemia 09/02/2024   AKI (acute kidney injury) 07/02/2024   Pulmonary hypertension, unspecified (HCC) 07/02/2024   Shortness of breath 07/02/2024   Acute dyspnea 07/01/2024   Right hip pain 06/21/2024   SOB (shortness of breath) 06/17/2024   COPD exacerbation (HCC) 06/16/2024   Other secondary pulmonary hypertension (HCC) 10/14/2023   Junctional rhythm 09/27/2023  Chronic heart failure with preserved ejection fraction (HFpEF) (HCC) 09/26/2023   Symptomatic bradycardia 09/26/2023   Pain due to onychomycosis of toenails of both feet 05/27/2023   Type II diabetes mellitus with peripheral circulatory disorder (HCC) 05/27/2023   Tachycardia-bradycardia  syndrome (HCC) 03/28/2022   Stage 3b chronic kidney disease (HCC) 02/21/2022   Type 2 diabetes mellitus with diabetic chronic kidney disease (HCC) 02/21/2022   Normocytic anemia 02/21/2022   Chronic obstructive pulmonary disease, unspecified (HCC) 02/21/2022   Edema, unspecified 02/21/2022   Hypertension 02/21/2022   Congestive heart failure (HCC) 02/21/2022   Other and unspecified hyperlipidemia 02/21/2022   Old myocardial infarction 02/21/2022   PAF (paroxysmal atrial fibrillation) (HCC) 02/21/2022   Heart failure (HCC) 11/04/2021   Acute on chronic diastolic CHF (congestive heart failure) (HCC) 11/03/2021   Secondary hypercoagulable state 10/23/2021   Persistent atrial fibrillation (HCC) 10/11/2021   Cellulitis 08/22/2021   Obese    Dyspepsia 04/05/2011   Allergic rhinitis 10/16/2010   COPD (chronic obstructive pulmonary disease) (HCC) 05/21/2010   Essential hypertension 04/06/2010   EDEMA 03/09/2010   ANEMIA-NOS 10/11/2009   Unspecified glaucoma 10/11/2009   CAD (coronary artery disease) 10/03/2009   HLD (hyperlipidemia) 07/12/2009   TOBACCO ABUSE 07/12/2009   Acute myocardial infarction (HCC) 07/12/2009   Diabetes mellitus 07/12/2009   PCP:  Ileen Rosaline NOVAK, NP Pharmacy:   [facility] - [place], [place] - [address] [place] [address] Phone: [phone] Fax: [phone]  [facility] [address] [place] [address] Phone: [phone] Fax: [phone]     Social Drivers of Health (SDOH) Social History: SDOH Screenings   Food Insecurity: No Food Insecurity (10/07/2024)  Housing: Low Risk  (10/07/2024)  Transportation Needs: No Transportation Needs (10/07/2024)  Utilities: Not At Risk (10/07/2024)  Alcohol Screen: Low Risk  (07/02/2024)  Depression (PHQ2-9): Low Risk  (07/22/2022)  Financial Resource Strain: Low Risk  (07/02/2024)  Social Connections: Socially Isolated (10/07/2024)   Tobacco Use: Medium Risk (10/06/2024)   SDOH Interventions:     Readmission Risk Interventions    07/05/2024   10:24 AM  Readmission Risk Prevention Plan  Transportation Screening Complete  HRI or Home Care Consult Complete  Social Work Consult for Recovery Care Planning/Counseling Complete  Palliative Care Screening Not Applicable  Medication Review Oceanographer) Complete

## 2024-10-07 NOTE — Progress Notes (Signed)
 [facility]                                                                               Jenny [name], is a 89 y.o. female, DOB - 11-22-1933, FMW:[id number] Admit date - 10/06/2024    Outpatient Primary MD for the patient is Schmerge, Jenny NOVAK, NP  LOS - 1  days    Brief summary    Jenny [name]  is a 89 y.o. female, with medical history significant of HFpEF, hypertension, persistent A-fib on Eliquis , tachybradycardia syndrome, hyperlipidemia, CAD, COPD, CKD stage IV, type 2 diabetes, anemia, GERD, class II obesity presents, patient with recent hospitalization due to COPD exacerbation, requiring BiPAP and oxygen, but she was discharged on room air . - Presents today with multiple complaints, dyspnea, progressive, initially at activity, currently with rest, as well she presents with dizziness, lightheadedness, tingling, numbness and lower extremity spasms, patient reports she has been taking her Lasix , but did forget to take her potassium supplement with it, as well she reports poor appetite, generalized weakness and fatigue, unable to get up from the couch.  In ED he is with significant wheezing, improved with nebulizer and steroids, labs significant for severe hypokalemia 2.2, low sodium at 126, AKI with creatinine up at 2.2, [facility] consulted to admit.   Assessment & Plan    Assessment and Plan:    Acute copd exacerbation:  Appears to have improved.  She is on RA now on exam today. Wheezing has improved.  Negative COVID/ RSV and influenza PCR.  She was started on solumedrol and transitioned to oral stetroids for a quick taper.  Continue with duonebs as needed.    Chronic diastolic CHF.  She appears to be compensated.  BNP is 138,  mildly elevated troponins from demand ischemia from COPD exacerbation and AKI, non-ACS pattern 82> 79  She currently denies any chest pain.  Restart lasix  with potassium supplementation on discharge. .  Get therapy  evaluations.     Persistent atrial fibrillation  Rate controlled and restart eliquis .    Hyperlipidemia Resume crestor .    Acute on Stage CKD Baseline creatinine around 1.7 from July 2025. Currently at 2.  Monitor.     CAD Currently chest pain.    Type 2 DM A1c is 6.6% On SSI.    Hyponatremia Sodium improved to 132.  Monitor. Asymptomatic.    Estimated body mass index is 34.37 kg/m as calculated from the following:   Height as of this encounter: 5' 2 (1.575 m).   Weight as of this encounter: 85.2 kg.  Code Status: full code.  DVT Prophylaxis:  apixaban  (ELIQUIS ) tablet 2.5 mg Start: 10/06/24 2200 apixaban  (ELIQUIS ) tablet 2.5 mg   Level of Care: Level of care: Progressive Family Communication: none at bedside.   Disposition Plan:     Remains inpatient appropriate:  pending clinical improvement.   Procedures:  None.   Consultants:   None   Antimicrobials:   Anti-infectives (From admission, onward)    None        Medications  Scheduled Meds:  apixaban   2.5 mg Oral BID   arformoterol   15 mcg Nebulization BID   And  umeclidinium bromide   1 puff Inhalation Daily   dorzolamide   1 drop Both Eyes BID   gabapentin   100 mg Oral q morning   gabapentin   200 mg Oral QHS   insulin  aspart  0-5 Units Subcutaneous QHS   insulin  aspart  0-6 Units Subcutaneous TID WC   latanoprost   1 drop Both Eyes QHS   methylPREDNISolone  (SOLU-MEDROL ) injection  125 mg Intravenous Daily   pantoprazole   40 mg Oral Daily   rosuvastatin   20 mg Oral Daily   Continuous Infusions:  0.9 % NaCl with KCl 40 mEq / L 75 mL/hr at 10/07/24 0723   PRN Meds:.acetaminophen  **OR** acetaminophen , hydrALAZINE , hydrOXYzine, ipratropium-albuterol , nitroGLYCERIN     Subjective:   Sidnee Gambrill was seen and examined today.  No new complaints. She reports feeling much better.   Objective:   Vitals:   10/07/24 0700 10/07/24 0800 10/07/24 0900 10/07/24 1000  BP: 131/77 130/64 131/64  139/61  Pulse:  85 (!) 52   Resp: 17 16 16    Temp:  97.8 F (36.6 C)    TempSrc:  Oral    SpO2: 100% 96% 100%   Weight:      Height:        Intake/Output Summary (Last 24 hours) at 10/07/2024 1041 Last data filed at 10/07/2024 0912 Gross per 24 hour  Intake 240 ml  Output --  Net 240 ml   Filed Weights   10/06/24 1358  Weight: 85.2 kg     Exam General: Alert and oriented x 3, NAD Cardiovascular: S1 S2 auscultated, no murmurs, RRR Respiratory: scattered wheezing, air entry fair on RA.  Gastrointestinal: Soft, nontender, nondistended, + bowel sounds Ext: no pedal edema bilaterally Neuro: AAOx3, grossly non focal Skin: No rashes Psych: Normal affect and demeanor, alert and oriented x3    Data Reviewed:  I have personally reviewed following labs and imaging studies   CBC Lab Results  Component Value Date   WBC 10.0 10/07/2024   RBC 3.99 10/07/2024   HGB 10.5 (L) 10/07/2024   HCT 33.5 (L) 10/07/2024   MCV 84.0 10/07/2024   MCH 26.3 10/07/2024   PLT 242 10/07/2024   MCHC 31.3 10/07/2024   RDW 17.2 (H) 10/07/2024   LYMPHSABS 0.4 (L) 10/06/2024   MONOABS 0.6 10/06/2024   EOSABS 0.0 10/06/2024   BASOSABS 0.0 10/06/2024     Last metabolic panel Lab Results  Component Value Date   NA 132 (L) 10/07/2024   K 3.5 10/07/2024   CL 83 (L) 10/07/2024   CO2 29 10/07/2024   BUN 75 (H) 10/07/2024   CREATININE 2.06 (H) 10/07/2024   GLUCOSE 317 (H) 10/07/2024   GFRNONAA 23 (L) 10/07/2024   GFRAA  10/09/2009    >60        The eGFR has been calculated using the MDRD equation. This calculation has not been validated in all clinical situations. eGFR's persistently <60 mL/min signify possible Chronic Kidney Disease.   CALCIUM  7.4 (L) 10/07/2024   PHOS 5.4 (H) 10/06/2024   PROT 6.0 (L) 09/02/2024   ALBUMIN 2.4 (L) 09/03/2024   LABGLOB 2.5 10/18/2021   AGRATIO 1.5 10/18/2021   BILITOT 1.2 09/02/2024   ALKPHOS 76 09/02/2024   AST 16 09/02/2024   ALT 11  09/02/2024   ANIONGAP 20 (H) 10/07/2024    CBG (last 3)  Recent Labs    10/06/24 2234 10/07/24 0744  GLUCAP 234* 264*      Coagulation Profile: No results for input(s): INR, PROTIME in the  last 168 hours.   Radiology Studies: DG Chest 1 View Result Date: 10/06/2024 EXAM: 1 VIEW(S) XRAY OF THE CHEST 10/06/2024 02:17:00 PM COMPARISON: 09/02/2024 CLINICAL HISTORY: sob. Reason for exam: sob; Per triage notes: PT to etc via [facility] from home with co sob that stared recently. PT has also had multiple falls recently. PT denies hitting her head but had been dizzy when she stands. PT says she also has tingling in her chest. FINDINGS: LUNGS AND PLEURA: No focal pulmonary opacity. Nodular density in right upper lung unchanged since 09/26/2023 favors a benign process. No pulmonary edema. No pleural effusion. No pneumothorax. HEART AND MEDIASTINUM: Cardiomegaly. Aortic calcification. BONES AND SOFT TISSUES: No acute osseous abnormality. IMPRESSION: 1. No acute cardiopulmonary process identified. Electronically signed by: Waddell Calk MD 10/06/2024 03:40 PM EDT RP Workstation: [id number]       Elgie Butter M.D. [facility] 10/07/2024, 10:41 AM  Available via Epic secure chat 7am-7pm After 7 pm, please refer to night coverage provider listed on amion.

## 2024-10-07 NOTE — Evaluation (Signed)
 Physical Therapy Evaluation Patient Details Name: Jenny [name] MRN: [id number] DOB: [birth year] Today's Date: 10/07/2024  History of Present Illness  89 yo F adm 10/06/24 for COPD exacerbation, hypokalemia. PMH: HFpEF, CKD, HTN, T2DM, COPD, Afib on Eliquis , CAD, HLD, glaucoma, obese.  Clinical Impression  Pt admitted secondary to problem above with deficits below. PTA patient lives alone in one level home with ramp to enter. She reports has had 3 falls in 3 weeks and has noticed a decline in her strength/balance over the past 1 month. She blames falls on a new rollator, however noted decr safety with use of rollator today. Pt currently requires min assist for transfers (especially on/off seat of rollator) and CGA for ambulation x 50 ft with rollator. Discussed followup PT options and pt would benefit from post-acute inpatient therapies <3 hrs/day. She is in agreement but states there are some facilities that she knows she will not go to.  Anticipate patient will benefit from PT to address problems listed below. Will continue to follow acutely to maximize functional mobility, independence, and safety.           If plan is discharge home, recommend the following: A little help with walking and/or transfers;A little help with bathing/dressing/bathroom;Assistance with cooking/housework   Can travel by private vehicle   Yes    Equipment Recommendations None recommended by PT  Recommendations for Other Services  OT consult    Functional Status Assessment Patient has had a recent decline in their functional status and demonstrates the ability to make significant improvements in function in a reasonable and predictable amount of time.     Precautions / Restrictions Precautions Precautions: Fall Precaution/Restrictions Comments: reports 3 falls in 3 weeks      Mobility  Bed Mobility Overal bed mobility: Independent             General bed mobility comments: sitting EOB on arrival and  reports she transitions independently to/from supine    Transfers Overall transfer level: Needs assistance Equipment used: Rollator (4 wheels) Transfers: Sit to/from Stand Sit to Stand: Contact guard assist           General transfer comment: vc for safety when turning to sit on rollator and when coming to stand from rollator    Ambulation/Gait Ambulation/Gait assistance: Contact guard assist Gait Distance (Feet): 50 Feet (seated rest, 50) Assistive device: Rollator (4 wheels) Gait Pattern/deviations: Step-through pattern, Trunk flexed       General Gait Details: incr WOB, but reports baseline  Stairs            Wheelchair Mobility     Tilt Bed    Modified Rankin (Stroke Patients Only)       Balance Overall balance assessment: History of Falls                                           Pertinent Vitals/Pain Pain Assessment Pain Assessment: No/denies pain    Home Living Family/patient expects to be discharged to:: Private residence Living Arrangements: Alone Available Help at Discharge: Family;Neighbor;Available PRN/intermittently Type of Home: House Home Access: Ramped entrance       Home Layout: One level Home Equipment: Rollator (4 wheels);BSC/3in1;Tub bench;Grab bars - tub/shower      Prior Function Prior Level of Function : Independent/Modified Independent             Mobility Comments: reports  3 falls in 3 weeks (reports new rollator obtained and it's slipped out from under her each time). She has now reverted back to her old rollator.       Extremity/Trunk Assessment   Upper Extremity Assessment Upper Extremity Assessment: Generalized weakness    Lower Extremity Assessment Lower Extremity Assessment: Generalized weakness    Cervical / Trunk Assessment Cervical / Trunk Assessment: Kyphotic  Communication   Communication Communication: No apparent difficulties    Cognition Arousal: Alert Behavior During  Therapy: WFL for tasks assessed/performed                             Following commands: Intact       Cueing Cueing Techniques: Verbal cues     General Comments General comments (skin integrity, edema, etc.): difficult to get SaO2 reading (tried each ear, finally had to hold her hand to warm it and then reading 100% on RA pre-walk and 100% post-walk). HR 43-65    Exercises     Assessment/Plan    PT Assessment Patient needs continued PT services  PT Problem List Decreased strength;Decreased activity tolerance;Decreased balance;Decreased mobility;Decreased knowledge of use of DME;Cardiopulmonary status limiting activity;Obesity       PT Treatment Interventions DME instruction;Gait training;Functional mobility training;Therapeutic activities;Therapeutic exercise;Balance training;Patient/family education    PT Goals (Current goals can be found in the Care Plan section)  Acute Rehab PT Goals Patient Stated Goal: to get her strength and balance better PT Goal Formulation: With patient Time For Goal Achievement: 10/21/24 Potential to Achieve Goals: Good    Frequency Min 2X/week     Co-evaluation               AM-PAC PT 6 Clicks Mobility  Outcome Measure Help needed turning from your back to your side while in a flat bed without using bedrails?: None Help needed moving from lying on your back to sitting on the side of a flat bed without using bedrails?: None Help needed moving to and from a bed to a chair (including a wheelchair)?: A Little Help needed standing up from a chair using your arms (e.g., wheelchair or bedside chair)?: A Little Help needed to walk in hospital room?: A Little Help needed climbing 3-5 steps with a railing? : Total 6 Click Score: 18    End of Session   Activity Tolerance: Patient limited by fatigue Patient left: in bed;with call bell/phone within reach Nurse Communication: Mobility status;Other (comment) (sats on room air and  how I warmed her hand to get a reading) PT Visit Diagnosis: Repeated falls (R29.6)    Time: 8484-8444 PT Time Calculation (min) (ACUTE ONLY): 40 min   Charges:   PT Evaluation $PT Eval Low Complexity: 1 Low PT Treatments $Gait Training: 23-37 mins PT General Charges $$ ACUTE PT VISIT: 1 Visit          Jenny [name], PT Acute Rehabilitation Services  Office [phone]   Jenny [name] 10/07/2024, 4:08 PM

## 2024-10-07 NOTE — ED Notes (Signed)
Pt and all belongings transported upstairs.  

## 2024-10-07 NOTE — NC FL2 (Signed)
 [place]  MEDICAID FL2 LEVEL OF CARE FORM     IDENTIFICATION  Patient Name: Jenny [name] Birthdate: 1933/04/23 Sex: female Admission Date (Current Location): 10/06/2024  [facility] [place] Number:  Producer, Television/film/video and Address:  [facility] [place], [address] [place], [place] [address]      Provider Number: [id number]  Attending Physician Name and Address:  Cherlyn Labella, MD  Relative Name and Phone Number:  Sevana Grandinetti; [place]; [phone]    Current Level of Care: Hospital Recommended Level of Care: Skilled Nursing Facility Prior Approval Number:    Date Approved/Denied:   PASRR Number: [id number]  Discharge Plan: SNF    Current Diagnoses: Patient Active Problem List   Diagnosis Date Noted   Respiratory distress 09/04/2024   CKD (chronic kidney disease), stage IV (HCC) 09/03/2024   GERD (gastroesophageal reflux disease) 09/03/2024   History of atrial fibrillation 09/03/2024   Acute respiratory failure (HCC) 09/02/2024   Hyponatremia 09/02/2024   Hypokalemia 09/02/2024   AKI (acute kidney injury) 07/02/2024   Pulmonary hypertension, unspecified (HCC) 07/02/2024   Shortness of breath 07/02/2024   Acute dyspnea 07/01/2024   Right hip pain 06/21/2024   SOB (shortness of breath) 06/17/2024   COPD exacerbation (HCC) 06/16/2024   Other secondary pulmonary hypertension (HCC) 10/14/2023   Junctional rhythm 09/27/2023   Chronic heart failure with preserved ejection fraction (HFpEF) (HCC) 09/26/2023   Symptomatic bradycardia 09/26/2023   Pain due to onychomycosis of toenails of both feet 05/27/2023   Type II diabetes mellitus with peripheral circulatory disorder (HCC) 05/27/2023   Tachycardia-bradycardia syndrome (HCC) 03/28/2022   Stage 3b chronic kidney disease (HCC) 02/21/2022   Type 2 diabetes mellitus with diabetic chronic kidney disease (HCC) 02/21/2022   Normocytic anemia 02/21/2022   Chronic obstructive pulmonary disease,  unspecified (HCC) 02/21/2022   Edema, unspecified 02/21/2022   Hypertension 02/21/2022   Congestive heart failure (HCC) 02/21/2022   Other and unspecified hyperlipidemia 02/21/2022   Old myocardial infarction 02/21/2022   PAF (paroxysmal atrial fibrillation) (HCC) 02/21/2022   Heart failure (HCC) 11/04/2021   Acute on chronic diastolic CHF (congestive heart failure) (HCC) 11/03/2021   Secondary hypercoagulable state 10/23/2021   Persistent atrial fibrillation (HCC) 10/11/2021   Cellulitis 08/22/2021   Obese    Dyspepsia 04/05/2011   Allergic rhinitis 10/16/2010   COPD (chronic obstructive pulmonary disease) (HCC) 05/21/2010   Essential hypertension 04/06/2010   EDEMA 03/09/2010   ANEMIA-NOS 10/11/2009   Unspecified glaucoma 10/11/2009   CAD (coronary artery disease) 10/03/2009   HLD (hyperlipidemia) 07/12/2009   TOBACCO ABUSE 07/12/2009   Acute myocardial infarction (HCC) 07/12/2009   Diabetes mellitus 07/12/2009    Orientation RESPIRATION BLADDER Height & Weight     Time, Situation, Place, Self  Normal (Room Air) Continent Weight: 187 lb 14.4 oz (85.2 kg) Height:  5' 2 (157.5 cm)  BEHAVIORAL SYMPTOMS/MOOD NEUROLOGICAL BOWEL NUTRITION STATUS      Continent Diet (Please see discharge summary)  AMBULATORY STATUS COMMUNICATION OF NEEDS Skin   Limited Assist Verbally Normal                       Personal Care Assistance Level of Assistance  Bathing, Feeding, Dressing Bathing Assistance: Limited assistance Feeding assistance: Limited assistance Dressing Assistance: Limited assistance     Functional Limitations Info  Sight, Hearing Sight Info: Impaired (R and L (Eyeglasses)) Hearing Info: Impaired (R and L)      SPECIAL CARE FACTORS FREQUENCY  PT (By  licensed PT), OT (By licensed OT)     PT Frequency: 5x OT Frequency: 5x            Contractures Contractures Info: Not present    Additional Factors Info  Code Status, Allergies, Insulin  Sliding Scale  Code Status Info: Full Code Allergies Info: Aspirin , Entresto  (Sacubitril -valsartan ), Atorvastatin , Cyclobenzaprine, Lactose Intolerance (Gi), Meperidine Hcl, Pravastatin , Propoxyphene, Wound Dressing Adhesive   Insulin  Sliding Scale Info: Please see discharge summary       Current Medications (10/07/2024):  This is the current hospital active medication list Current Facility-Administered Medications  Medication Dose Route Frequency Provider Last Rate Last Admin   acetaminophen  (TYLENOL ) tablet 650 mg  650 mg Oral Q6H PRN Elgergawy, Dawood S, MD       Or   acetaminophen  (TYLENOL ) suppository 650 mg  650 mg Rectal Q6H PRN Elgergawy, Dawood S, MD       apixaban  (ELIQUIS ) tablet 2.5 mg  2.5 mg Oral BID Elgergawy, Dawood S, MD   2.5 mg at 10/07/24 9070   arformoterol  (BROVANA ) nebulizer solution 15 mcg  15 mcg Nebulization BID Elgergawy, Dawood S, MD   15 mcg at 10/07/24 9145   And   umeclidinium bromide  (INCRUSE ELLIPTA ) 62.5 MCG/ACT 1 puff  1 puff Inhalation Daily Elgergawy, Dawood S, MD   1 puff at 10/07/24 0853   dorzolamide  (TRUSOPT ) 2 % ophthalmic solution 1 drop  1 drop Both Eyes BID Elgergawy, Brayton RAMAN, MD   1 drop at 10/07/24 9068   gabapentin  (NEURONTIN ) capsule 100 mg  100 mg Oral q morning Elgergawy, Dawood S, MD   100 mg at 10/07/24 9071   gabapentin  (NEURONTIN ) capsule 200 mg  200 mg Oral QHS Elgergawy, Dawood S, MD   200 mg at 10/06/24 2229   hydrALAZINE  (APRESOLINE ) injection 5 mg  5 mg Intravenous Q4H PRN Elgergawy, Dawood S, MD       hydrOXYzine (ATARAX) tablet 25 mg  25 mg Oral Q8H PRN Elgergawy, Dawood S, MD       insulin  aspart (novoLOG ) injection 0-5 Units  0-5 Units Subcutaneous QHS Elgergawy, Dawood S, MD   2 Units at 10/06/24 2235   insulin  aspart (novoLOG ) injection 0-6 Units  0-6 Units Subcutaneous TID WC Elgergawy, Dawood S, MD   4 Units at 10/07/24 1222   insulin  aspart (novoLOG ) injection 3 Units  3 Units Subcutaneous TID WC Akula, Vijaya, MD        ipratropium-albuterol  (DUONEB) 0.5-2.5 (3) MG/3ML nebulizer solution 3 mL  3 mL Nebulization Q4H PRN Elgergawy, Brayton RAMAN, MD       latanoprost  (XALATAN ) 0.005 % ophthalmic solution 1 drop  1 drop Both Eyes QHS Elgergawy, Brayton RAMAN, MD   1 drop at 10/06/24 2229   nitroGLYCERIN  (NITROSTAT ) SL tablet 0.4 mg  0.4 mg Sublingual Q5 min PRN Elgergawy, Dawood S, MD       pantoprazole  (PROTONIX ) EC tablet 40 mg  40 mg Oral Daily Elgergawy, Dawood S, MD   40 mg at 10/07/24 0928   [START ON 10/08/2024] predniSONE  (DELTASONE ) tablet 60 mg  60 mg Oral QAC breakfast Akula, Vijaya, MD       rosuvastatin  (CRESTOR ) tablet 20 mg  20 mg Oral Daily Elgergawy, Dawood S, MD   20 mg at 10/07/24 9070     Discharge Medications: Please see discharge summary for a list of discharge medications.  Relevant Imaging Results:  Relevant Lab Results:   Additional Information SSN [id number]  Lauraine FORBES Saa, LCSWA

## 2024-10-07 NOTE — Inpatient Diabetes Management (Signed)
 Inpatient Diabetes Program Recommendations  AACE/ADA: New Consensus Statement on Inpatient Glycemic Control (2015)  Target Ranges:  Prepandial:   less than 140 mg/dL      Peak postprandial:   less than 180 mg/dL (1-2 hours)      Critically ill patients:  140 - 180 mg/dL   Lab Results  Component Value Date   GLUCAP 302 (H) 10/07/2024   HGBA1C 6.6 (H) 06/18/2024    Review of Glycemic Control  Latest Reference Range & Units 10/06/24 22:34 10/07/24 07:44 10/07/24 11:36  Glucose-Capillary 70 - 99 mg/dL 765 (H) 735 (H) 697 (H)   Diabetes history: DM 2 Outpatient Diabetes medications:  None Current orders for Inpatient glycemic control:  Novolog  0-6 units tid with meals and HS Prednisone  60 mg daily for 3 days (starts tomorrow) Inpatient Diabetes Program Recommendations:    While on steroids, consider adding Semglee 8 units daily and Novolog  2 units tid with meals (hold if patient eats less than 50% or NPO).    Thanks,  Randall Bullocks, RN, BC-ADM Inpatient Diabetes Coordinator Pager [phone]  (8a-5p)

## 2024-10-08 ENCOUNTER — Inpatient Hospital Stay (HOSPITAL_COMMUNITY)

## 2024-10-08 DIAGNOSIS — I480 Paroxysmal atrial fibrillation: Secondary | ICD-10-CM | POA: Diagnosis not present

## 2024-10-08 DIAGNOSIS — E876 Hypokalemia: Secondary | ICD-10-CM | POA: Diagnosis not present

## 2024-10-08 DIAGNOSIS — J441 Chronic obstructive pulmonary disease with (acute) exacerbation: Secondary | ICD-10-CM | POA: Diagnosis not present

## 2024-10-08 DIAGNOSIS — I10 Essential (primary) hypertension: Secondary | ICD-10-CM | POA: Diagnosis not present

## 2024-10-08 LAB — CBC WITH DIFFERENTIAL/PLATELET
Abs Immature Granulocytes: 0.13 K/uL — ABNORMAL HIGH (ref 0.00–0.07)
Basophils Absolute: 0 K/uL (ref 0.0–0.1)
Basophils Relative: 0 %
Eosinophils Absolute: 0 K/uL (ref 0.0–0.5)
Eosinophils Relative: 0 %
HCT: 33.4 % — ABNORMAL LOW (ref 36.0–46.0)
Hemoglobin: 10.2 g/dL — ABNORMAL LOW (ref 12.0–15.0)
Immature Granulocytes: 1 %
Lymphocytes Relative: 2 %
Lymphs Abs: 0.3 K/uL — ABNORMAL LOW (ref 0.7–4.0)
MCH: 26.4 pg (ref 26.0–34.0)
MCHC: 30.5 g/dL (ref 30.0–36.0)
MCV: 86.5 fL (ref 80.0–100.0)
Monocytes Absolute: 0.5 K/uL (ref 0.1–1.0)
Monocytes Relative: 3 %
Neutro Abs: 17 K/uL — ABNORMAL HIGH (ref 1.7–7.7)
Neutrophils Relative %: 94 %
Platelets: 283 K/uL (ref 150–400)
RBC: 3.86 MIL/uL — ABNORMAL LOW (ref 3.87–5.11)
RDW: 17.3 % — ABNORMAL HIGH (ref 11.5–15.5)
WBC: 18.1 K/uL — ABNORMAL HIGH (ref 4.0–10.5)
nRBC: 0 % (ref 0.0–0.2)

## 2024-10-08 LAB — GLUCOSE, CAPILLARY
Glucose-Capillary: 176 mg/dL — ABNORMAL HIGH (ref 70–99)
Glucose-Capillary: 255 mg/dL — ABNORMAL HIGH (ref 70–99)
Glucose-Capillary: 157 mg/dL — ABNORMAL HIGH (ref 70–99)
Glucose-Capillary: 194 mg/dL — ABNORMAL HIGH (ref 70–99)

## 2024-10-08 LAB — BASIC METABOLIC PANEL WITH GFR
Anion gap: 15 (ref 5–15)
BUN: 72 mg/dL — ABNORMAL HIGH (ref 8–23)
CO2: 29 mmol/L (ref 22–32)
Calcium: 8.2 mg/dL — ABNORMAL LOW (ref 8.9–10.3)
Chloride: 88 mmol/L — ABNORMAL LOW (ref 98–111)
Creatinine, Ser: 2.37 mg/dL — ABNORMAL HIGH (ref 0.44–1.00)
GFR, Estimated: 19 mL/min — ABNORMAL LOW (ref >60)
Glucose, Bld: 248 mg/dL — ABNORMAL HIGH (ref 70–99)
Potassium: 4.7 mmol/L (ref 3.5–5.1)
Sodium: 132 mmol/L — ABNORMAL LOW (ref 135–145)

## 2024-10-08 NOTE — Evaluation (Signed)
 Occupational Therapy Evaluation Patient Details Name: Jenny [name] MRN: [id number] DOB: 06/23/1933 Today's Date: 10/08/2024   History of Present Illness   89 yo F adm 10/06/24 for COPD exacerbation, hypokalemia. PMH: HFpEF, CKD, HTN, T2DM, COPD, Afib on Eliquis , CAD, HLD, glaucoma, obese.     Clinical Impressions Pt admitted based on above, and was seen based on problem list below. PTA pt was independent with ADLs with a hx of falls within the last 6 months. Today pt is requiring set up  to min assist for ADLs. Pt able to complete STS with CGA, declining OOB activity d/t fatigue. Pt limited by decreased activity tolerance and balance. Pt using pursed lip breathing throughout session, difficult to get consistent pleth for extended periods of time. Pt stating at 96% on RA with brief consistent pleth. Recommendation of <3 hours of skilled rehab daily. OT will continue to follow acutely to maximize functional independence.        If plan is discharge home, recommend the following:   A little help with walking and/or transfers;A little help with bathing/dressing/bathroom;Assistance with cooking/housework     Functional Status Assessment   Patient has had a recent decline in their functional status and demonstrates the ability to make significant improvements in function in a reasonable and predictable amount of time.     Equipment Recommendations   None recommended by OT      Precautions/Restrictions   Precautions Precautions: Fall Recall of Precautions/Restrictions: Intact Restrictions Weight Bearing Restrictions Per Provider Order: No     Mobility Bed Mobility Overal bed mobility: Modified Independent     General bed mobility comments: Received EOB, pt able to return legs to bed, HOB elevated, use of rails    Transfers Overall transfer level: Needs assistance Equipment used: None Transfers: Sit to/from Stand Sit to Stand: Contact guard assist            General transfer comment: Pt declining chair transfer, but agreeable to stand EOB and step in place, CGA for balance      Balance Overall balance assessment: History of Falls       ADL either performed or assessed with clinical judgement   ADL Overall ADL's : Needs assistance/impaired Eating/Feeding: Set up;Sitting   Grooming: Set up;Sitting   Upper Body Bathing: Set up;Sitting   Lower Body Bathing: Minimal assistance;Sit to/from stand   Upper Body Dressing : Set up;Sitting   Lower Body Dressing: Minimal assistance;Sit to/from stand   Toilet Transfer: Minimal assistance   Toileting- Clothing Manipulation and Hygiene: Sit to/from stand;Minimal assistance       Functional mobility during ADLs: Minimal assistance General ADL Comments: Limited by decreased activity tolerance     Vision Baseline Vision/History: 1 Wears glasses Vision Assessment?: No apparent visual deficits            Pertinent Vitals/Pain Pain Assessment Pain Assessment: No/denies pain     Extremity/Trunk Assessment Upper Extremity Assessment Upper Extremity Assessment: Generalized weakness   Lower Extremity Assessment Lower Extremity Assessment: Defer to PT evaluation   Cervical / Trunk Assessment Cervical / Trunk Assessment: Kyphotic   Communication Communication Communication: Impaired Factors Affecting Communication: Hearing impaired   Cognition Arousal: Alert Behavior During Therapy: Anxious Cognition: No apparent impairments       Following commands: Intact       Cueing  General Comments   Cueing Techniques: Verbal cues  Difficult to get SpO2 reading on bil hands. Brief periods of consistent pleth at 96% on RA  Home Living Family/patient expects to be discharged to:: Private residence Living Arrangements: Alone Available Help at Discharge: Family;Neighbor;Available PRN/intermittently Type of Home: House Home Access: Ramped entrance     Home Layout: One  level     Bathroom Shower/Tub: Tub/shower unit;Sponge bathes at baseline   Bathroom Toilet: Handicapped height Bathroom Accessibility: No   Home Equipment: Rollator (4 wheels);BSC/3in1;Tub bench;Grab bars - tub/shower          Prior Functioning/Environment Prior Level of Function : Independent/Modified Independent;History of Falls (last six months)   Mobility Comments: reports 3 falls in 3 weeks ADLs Comments: ind    OT Problem List: Decreased strength;Decreased range of motion;Decreased activity tolerance;Impaired balance (sitting and/or standing);Cardiopulmonary status limiting activity;Decreased safety awareness;Decreased knowledge of use of DME or AE   OT Treatment/Interventions: Self-care/ADL training;Therapeutic exercise;Energy conservation;DME and/or AE instruction;Therapeutic activities;Patient/family education;Balance training      OT Goals(Current goals can be found in the care plan section)   Acute Rehab OT Goals Patient Stated Goal: to get sleep OT Goal Formulation: With patient Time For Goal Achievement: 10/22/24 Potential to Achieve Goals: Good   OT Frequency:  Min 2X/week       AM-PAC OT 6 Clicks Daily Activity     Outcome Measure Help from another person eating meals?: None Help from another person taking care of personal grooming?: A Little Help from another person toileting, which includes using toliet, bedpan, or urinal?: A Little Help from another person bathing (including washing, rinsing, drying)?: A Little Help from another person to put on and taking off regular upper body clothing?: A Little Help from another person to put on and taking off regular lower body clothing?: A Little 6 Click Score: 19   End of Session Nurse Communication: Mobility status  Activity Tolerance: Patient tolerated treatment well Patient left: in bed;with call bell/phone within reach  OT Visit Diagnosis: Unsteadiness on feet (R26.81);Other abnormalities of gait and  mobility (R26.89);Muscle weakness (generalized) (M62.81);History of falling (Z91.81)                Time: 8966-8953 OT Time Calculation (min): 13 min Charges:  OT General Charges $OT Visit: 1 Visit OT Evaluation $OT Eval Moderate Complexity: 1 Mod  Adrianne BROCKS, OT  Acute Rehabilitation Services Office [phone] Secure chat preferred   Adrianne GORMAN Savers 10/08/2024, 10:58 AM

## 2024-10-08 NOTE — TOC Progression Note (Addendum)
 Transition of Care [facility]) - Progression Note    Patient Details  Name: Jenny [name] MRN: [id number] Date of Birth: 06/30/1933  [facility] of [facility]) CM/SW Contact  Janell Keeling E Bessie Boyte, LCSW Phone Number: 10/08/2024, 9:48 AM  Clinical Narrative:    CSW met with patient at bedside. Patient's son Jenny [name] was present on speaker phone.  Provided list of SNF bed offers with Medicare ratings. Patient and son agreed to review today for preference. Hard copy provided to patient, copy also emailed to Jenny [name] at [email or web address] Patient and son requested Chaplain consult to help with HCPOA paperwork, around 1pm today if possible. Notified RN and MD. Patient and son request OP Palliative services resume through [facility] - referral made to [facility].    10:45- Patient chose [facility]. Per MD, patient will possibly be ready over the weekend, or Monday. Son states he can transport patient to SNF if someone at the hospital can help get patient into the vehicle and someone at [facility] can help patient get out. Confirmed with Darrian at [facility] that they can accept patient, and can assist with getting patient out of vehicle upon arrival. [facility] will have a bed on Monday - possibly over the weekend if patient is ready, depending on staffing.  Care Team and son updated.   11:25- Darrian with [facility] states they can take patient over weekend if ready, or Monday. Care Team and son updated.  Expected Discharge Plan: Skilled Nursing Facility Barriers to Discharge: Continued Medical Work up, SNF Pending bed offer               Expected Discharge Plan and Services In-house Referral: Clinical Social Work   Post Acute Care Choice: Skilled Nursing Facility Living arrangements for the past 2 months: Single Family Home                                       Social Drivers of Health (SDOH) Interventions SDOH Screenings   Food Insecurity: No Food Insecurity  (10/07/2024)  Housing: Low Risk  (10/07/2024)  Transportation Needs: No Transportation Needs (10/07/2024)  Utilities: Not At Risk (10/07/2024)  Alcohol Screen: Low Risk  (07/02/2024)  Depression (PHQ2-9): Low Risk  (07/22/2022)  Financial Resource Strain: Low Risk  (07/02/2024)  Social Connections: Socially Isolated (10/07/2024)  Tobacco Use: Medium Risk (10/06/2024)    Readmission Risk Interventions    07/05/2024   10:24 AM  Readmission Risk Prevention Plan  Transportation Screening Complete  HRI or Home Care Consult Complete  Social Work Consult for Recovery Care Planning/Counseling Complete  Palliative Care Screening Not Applicable  Medication Review [employer]) Complete

## 2024-10-08 NOTE — Plan of Care (Signed)
  Problem: Metabolic: Goal: Ability to maintain appropriate glucose levels will improve Outcome: Progressing   Problem: Clinical Measurements: Goal: Cardiovascular complication will be avoided Outcome: Progressing   Problem: Nutrition: Goal: Adequate nutrition will be maintained Outcome: Progressing   Problem: Coping: Goal: Level of anxiety will decrease Outcome: Progressing   Problem: Pain Managment: Goal: General experience of comfort will improve and/or be controlled Outcome: Progressing   Problem: Safety: Goal: Ability to remain free from injury will improve Outcome: Progressing

## 2024-10-08 NOTE — Progress Notes (Signed)
 [facility]                                                                               Jenny [name], is a 89 y.o. female, DOB - 1933/09/07, FMW:[id number] Admit date - 10/06/2024    Outpatient Primary MD for the patient is Schmerge, Rosaline NOVAK, NP  LOS - 2  days    Brief summary    Jenny [name]  is a 89 y.o. female, with medical history significant of HFpEF, hypertension, persistent A-fib on Eliquis , tachybradycardia syndrome, hyperlipidemia, CAD, COPD, CKD stage IV, type 2 diabetes, anemia, GERD, class II obesity presents, patient with recent hospitalization due to COPD exacerbation, requiring BiPAP and oxygen, but she was discharged on room air . - Presents today with multiple complaints, dyspnea, progressive, initially at activity, currently with rest, as well she presents with dizziness, lightheadedness, tingling, numbness and lower extremity spasms, patient reports she has been taking her Lasix , but did forget to take her potassium supplement with it, as well she reports poor appetite, generalized weakness and fatigue, unable to get up from the couch.  In ED he is with significant wheezing, improved with nebulizer and steroids, labs significant for severe hypokalemia 2.2, low sodium at 126, AKI with creatinine up at 2.2, [facility] consulted to admit.   Assessment & Plan    Assessment and Plan:    Acute copd exacerbation:  Appears to have improved.  She is on RA now . Wheezing has improved.  Negative COVID/ RSV and influenza PCR.  She was started on solumedrol and transitioned to oral stetroids for a quick taper.  Continue with duonebs as needed.  Check ambulating oxygen levels prior to discharge.   Leukocytosis Possibly from steroids.    Anemia of chronic disease Hemoglobin around 10 and stable.    Chronic diastolic CHF.  She appears to be compensated.  BNP is 138,  mildly elevated troponins from demand ischemia from COPD exacerbation and AKI,  non-ACS pattern 82> 79  She currently denies any chest pain.  Restart lasix  with potassium supplementation on discharge. .  Therapy eval recommending SNF. Toc on board .     Persistent atrial fibrillation  Rate controlled and restart eliquis .    Hyperlipidemia Resume crestor .    Acute on Stage CKD Baseline creatinine around 1.7 from July 2025. Currently at 2.37. check UA, US  renal and renal electrolytes.  Unclear if this is her new baseline.  Monitor.     CAD Currently chest pain.    Type 2 DM A1c is 6.6% On SSI.  CBG (last 3)  Recent Labs    10/07/24 1613 10/07/24 2058 10/08/24 0600  GLUCAP 244* 215* 176*      Hyponatremia Sodium improved to 132.  Monitor. Asymptomatic.    Estimated body mass index is 34.37 kg/m as calculated from the following:   Height as of this encounter: 5' 2 (1.575 m).   Weight as of this encounter: 85.2 kg.  Code Status: full code.  DVT Prophylaxis:  apixaban  (ELIQUIS ) tablet 2.5 mg Start: 10/06/24 2200 apixaban  (ELIQUIS ) tablet 2.5 mg   Level of Care: Level of care: Progressive Family Communication: none at bedside.  Disposition Plan:     Remains inpatient appropriate:  pending clinical improvement and SNF on discharge.   Procedures:  None.   Consultants:   None   Antimicrobials:   Anti-infectives (From admission, onward)    None        Medications  Scheduled Meds:  apixaban   2.5 mg Oral BID   arformoterol   15 mcg Nebulization BID   And   umeclidinium bromide   1 puff Inhalation Daily   dorzolamide   1 drop Both Eyes BID   gabapentin   100 mg Oral q morning   gabapentin   200 mg Oral QHS   insulin  aspart  0-5 Units Subcutaneous QHS   insulin  aspart  0-6 Units Subcutaneous TID WC   insulin  aspart  3 Units Subcutaneous TID WC   latanoprost   1 drop Both Eyes QHS   pantoprazole   40 mg Oral Daily   predniSONE   60 mg Oral QAC breakfast   rosuvastatin   20 mg Oral Daily   Continuous Infusions:   PRN  Meds:.acetaminophen  **OR** acetaminophen , hydrALAZINE , hydrOXYzine, ipratropium-albuterol , nitroGLYCERIN     Subjective:   Jenny [name] was seen and examined today.  Feeling better, worried about her 2 cats at home.  No chest pain or sob. Feeling weak.   Objective:   Vitals:   10/07/24 2134 10/07/24 2306 10/08/24 0342 10/08/24 0900  BP: (!) 143/78 (!) 113/57 (!) 158/52 (!) 125/55  Pulse:  64 67 60  Resp: (!) 24 17 20 20   Temp:  97.7 F (36.5 C) 97.7 F (36.5 C) 97.7 F (36.5 C)  TempSrc:  Oral Oral Oral  SpO2: 95% 99% 96% 98%  Weight:      Height:        Intake/Output Summary (Last 24 hours) at 10/08/2024 1034 Last data filed at 10/08/2024 0343 Gross per 24 hour  Intake 120 ml  Output --  Net 120 ml   Filed Weights   10/06/24 1358  Weight: 85.2 kg     Exam General exam: Appears calm and comfortable  Respiratory system: air entry fair. On RA.  Cardiovascular system: S1 & S2 heard, irregularly irregular.  Gastrointestinal system: Abdomen is nondistended, soft and nontender.  Central nervous system: Alert and oriented. No focal neurological deficits. Extremities: Symmetric 5 x 5 power. Skin: No rashes, Psychiatry: normal mood.   Data Reviewed:  I have personally reviewed following labs and imaging studies   CBC Lab Results  Component Value Date   WBC 18.1 (H) 10/08/2024   RBC 3.86 (L) 10/08/2024   HGB 10.2 (L) 10/08/2024   HCT 33.4 (L) 10/08/2024   MCV 86.5 10/08/2024   MCH 26.4 10/08/2024   PLT 283 10/08/2024   MCHC 30.5 10/08/2024   RDW 17.3 (H) 10/08/2024   LYMPHSABS 0.3 (L) 10/08/2024   MONOABS 0.5 10/08/2024   EOSABS 0.0 10/08/2024   BASOSABS 0.0 10/08/2024     Last metabolic panel Lab Results  Component Value Date   NA 132 (L) 10/08/2024   K 4.7 10/08/2024   CL 88 (L) 10/08/2024   CO2 29 10/08/2024   BUN 72 (H) 10/08/2024   CREATININE 2.37 (H) 10/08/2024   GLUCOSE 248 (H) 10/08/2024   GFRNONAA 19 (L) 10/08/2024   GFRAA  10/09/2009     >60        The eGFR has been calculated using the MDRD equation. This calculation has not been validated in all clinical situations. eGFR's persistently <60 mL/min signify possible Chronic Kidney Disease.   CALCIUM  8.2 (L) 10/08/2024  PHOS 5.4 (H) 10/06/2024   PROT 6.0 (L) 09/02/2024   ALBUMIN 2.4 (L) 09/03/2024   LABGLOB 2.5 10/18/2021   AGRATIO 1.5 10/18/2021   BILITOT 1.2 09/02/2024   ALKPHOS 76 09/02/2024   AST 16 09/02/2024   ALT 11 09/02/2024   ANIONGAP 15 10/08/2024    CBG (last 3)  Recent Labs    10/07/24 1613 10/07/24 2058 10/08/24 0600  GLUCAP 244* 215* 176*      Coagulation Profile: No results for input(s): INR, PROTIME in the last 168 hours.   Radiology Studies: DG Chest 1 View Result Date: 10/06/2024 EXAM: 1 VIEW(S) XRAY OF THE CHEST 10/06/2024 02:17:00 PM COMPARISON: 09/02/2024 CLINICAL HISTORY: sob. Reason for exam: sob; Per triage notes: PT to etc via [facility] from home with co sob that stared recently. PT has also had multiple falls recently. PT denies hitting her head but had been dizzy when she stands. PT says she also has tingling in her chest. FINDINGS: LUNGS AND PLEURA: No focal pulmonary opacity. Nodular density in right upper lung unchanged since 09/26/2023 favors a benign process. No pulmonary edema. No pleural effusion. No pneumothorax. HEART AND MEDIASTINUM: Cardiomegaly. Aortic calcification. BONES AND SOFT TISSUES: No acute osseous abnormality. IMPRESSION: 1. No acute cardiopulmonary process identified. Electronically signed by: Waddell Calk MD 10/06/2024 03:40 PM EDT RP Workstation: [id number]       Elgie Butter M.D. [facility] 10/08/2024, 10:34 AM  Available via Epic secure chat 7am-7pm After 7 pm, please refer to night coverage provider listed on amion.

## 2024-10-08 NOTE — Care Management Important Message (Signed)
 Important Message  Patient Details  Name: Jenny [name] MRN: [id number] Date of Birth: July 27, 1933   Important Message Given:  Yes - Medicare IM     Claretta Deed 10/08/2024, 1:35 PM

## 2024-10-09 ENCOUNTER — Other Ambulatory Visit (HOSPITAL_COMMUNITY): Payer: Self-pay

## 2024-10-09 DIAGNOSIS — I495 Sick sinus syndrome: Secondary | ICD-10-CM

## 2024-10-09 DIAGNOSIS — I480 Paroxysmal atrial fibrillation: Secondary | ICD-10-CM | POA: Diagnosis not present

## 2024-10-09 DIAGNOSIS — J441 Chronic obstructive pulmonary disease with (acute) exacerbation: Secondary | ICD-10-CM | POA: Diagnosis not present

## 2024-10-09 DIAGNOSIS — E876 Hypokalemia: Secondary | ICD-10-CM | POA: Diagnosis not present

## 2024-10-09 LAB — BASIC METABOLIC PANEL WITH GFR
Anion gap: 16 — ABNORMAL HIGH (ref 5–15)
BUN: 58 mg/dL — ABNORMAL HIGH (ref 8–23)
CO2: 25 mmol/L (ref 22–32)
Calcium: 8 mg/dL — ABNORMAL LOW (ref 8.9–10.3)
Chloride: 90 mmol/L — ABNORMAL LOW (ref 98–111)
Creatinine, Ser: 1.82 mg/dL — ABNORMAL HIGH (ref 0.44–1.00)
GFR, Estimated: 26 mL/min — ABNORMAL LOW (ref >60)
Glucose, Bld: 298 mg/dL — ABNORMAL HIGH (ref 70–99)
Potassium: 4.3 mmol/L (ref 3.5–5.1)
Sodium: 131 mmol/L — ABNORMAL LOW (ref 135–145)

## 2024-10-09 LAB — GLUCOSE, CAPILLARY
Glucose-Capillary: 109 mg/dL — ABNORMAL HIGH (ref 70–99)
Glucose-Capillary: 305 mg/dL — ABNORMAL HIGH (ref 70–99)

## 2024-10-09 MED ORDER — PREDNISONE 10 MG PO TABS
40.0000 mg | ORAL_TABLET | Freq: Every day | ORAL | 0 refills | Status: AC
  Filled 2024-10-09: qty 12, 3d supply, fill #0

## 2024-10-09 MED ORDER — PREDNISONE 20 MG PO TABS: 20.0000 mg | ORAL_TABLET | Freq: Every day | ORAL | Status: DC

## 2024-10-09 NOTE — Discharge Summary (Signed)
 Physician Discharge Summary   Patient: Jenny [name] MRN: [id number] DOB: [birth year]  Admit date:     10/06/2024  Discharge date: 10/09/24  Discharge Physician: Elgie Butter   PCP: Ileen Rosaline NOVAK, NP   Recommendations at discharge:  Please follow up with PCP in 1 to 2 weeks.   Discharge Diagnoses: Principal Problem:   COPD exacerbation (HCC) Active Problems:   Tachycardia-bradycardia syndrome (HCC)   Hypokalemia   PAF (paroxysmal atrial fibrillation) (HCC)   Hypertension   Type II diabetes mellitus with peripheral circulatory disorder [facility])   Hospital Course:   Jenny [name]  is a 89 y.o. female, with medical history significant of HFpEF, hypertension, persistent A-fib on Eliquis , tachybradycardia syndrome, hyperlipidemia, CAD, COPD, CKD stage IV, type 2 diabetes, anemia, GERD, class II obesity presents, patient with recent hospitalization due to COPD exacerbation, requiring BiPAP and oxygen, but she was discharged on room air . - Presents today with multiple complaints, dyspnea, progressive, initially at activity, currently with rest, as well she presents with dizziness, lightheadedness, tingling, numbness and lower extremity spasms, patient reports she has been taking her Lasix , but did forget to take her potassium supplement with it, as well she reports poor appetite, generalized weakness and fatigue, unable to get up from the couch.  In ED he is with significant wheezing, improved with nebulizer and steroids, labs significant for severe hypokalemia 2.2, low sodium at 126, AKI with creatinine up at 2.2, [facility] consulted to admit.   Assessment and Plan:  Acute copd exacerbation:  Appears to have improved.  She is on RA now . Wheezing has improved.  Negative COVID/ RSV and influenza PCR.  She was started on solumedrol and transitioned to oral stetroids for a quick taper followed by a maintenance dose of 20 mg of prednisone .  Continue with duonebs as needed.  Check  ambulating oxygen levels prior to discharge.    Leukocytosis Possibly from steroids.      Anemia of chronic disease Hemoglobin around 10 and stable.      Chronic diastolic CHF.  She appears to be compensated.  BNP is 138,  mildly elevated troponins from demand ischemia from COPD exacerbation and AKI, non-ACS pattern 82> 79  She currently denies any chest pain.  Restart lasix  with potassium supplementation on discharge. .  Therapy eval recommending SNF. Toc on board .        Persistent atrial fibrillation  Rate controlled and restart eliquis .      Hyperlipidemia Resume crestor .      Acute on Stage CKD Baseline creatinine around 1.7 from July 2025. Currently at 2.37.  Resolved.  Creatinine back to 1.8 after holding lasix  for a couple of days. Suspect creatinine around 2 is her new baseline.  Once we start lasix  40 mg BID, I think her creatinine is going to go to 2.  Recommend checking BMP in one week to make sure creatinine remains around 2.        CAD Currently chest pain.      Type 2 DM A1c is 6.6% Resume home meds.      Hyponatremia Sodium improved to 132.  Asymptomatic.      Estimated body mass index is 34.37 kg/m as calculated from the following:   Height as of this encounter: 5' 2 (1.575 m).   Weight as of this encounter: 85.2 kg.   Consultants: none.  Procedures performed: none.   Disposition: Skilled nursing facility Diet recommendation:  Cardiac and Carb modified diet DISCHARGE MEDICATION:  Allergies as of 10/09/2024       Reactions   Aspirin  Other (See Comments)   Can take 81 mg not 325 mg -bleeding   Entresto  [sacubitril -valsartan ] Shortness Of Breath   COPD and reports increased SOB with Entresto    Atorvastatin  Itching, Rash   Itching and myaliga   Cyclobenzaprine     muscle relaxers-ulcer hemorrhage (hospitalized), ulcer hemorrhage   Lactose Intolerance (gi) Other (See Comments)   GI upset   Meperidine Hcl Other (See Comments)    Not known   Pravastatin  Rash   Propoxyphene    Hallucination, hallucinations, vomiting   Wound Dressing Adhesive Other (See Comments)   red, stings        Medication List     STOP taking these medications    albuterol  108 (90 Base) MCG/ACT inhaler Commonly known as: Ventolin  HFA       TAKE these medications    acetaminophen  500 MG tablet Commonly known as: TYLENOL  Take 1,000 mg by mouth every 6 (six) hours as needed for moderate pain (pain score 4-6).   acetaminophen -codeine  300-15 MG tablet Commonly known as: TYLENOL  #2 Take 1 tablet by mouth every 8 (eight) hours as needed for moderate pain (pain score 4-6).   apixaban  2.5 MG Tabs tablet Commonly known as: ELIQUIS  Take 1 tablet (2.5 mg total) by mouth 2 (two) times daily.   dorzolamide  2 % ophthalmic solution Commonly known as: TRUSOPT  Place 1 drop into both eyes 2 (two) times daily.   furosemide  40 MG tablet Commonly known as: LASIX  Take 1 tablet (40 mg total) by mouth 2 (two) times daily.   gabapentin  100 MG capsule Commonly known as: NEURONTIN  Take 100-200 mg by mouth See admin instructions. Take 1 capsule (100mg ) by mouth every morning and take 2 capsules (200mg ) by mouth at bedtime   hydrOXYzine 25 MG tablet Commonly known as: ATARAX Take 25 mg by mouth every 8 (eight) hours as needed for itching.   ipratropium-albuterol  0.5-2.5 (3) MG/3ML Soln Commonly known as: DUONEB Take 3 mLs by nebulization every 6 (six) hours as needed.   latanoprost  0.005 % ophthalmic solution Commonly known as: XALATAN  Place 1 drop into both eyes at bedtime.   metolazone  2.5 MG tablet Commonly known as: ZAROXOLYN  Take 1 tablet (2.5 mg total) by mouth as needed.   Nitrostat  0.4 MG SL tablet Generic drug: nitroGLYCERIN  DISSOLVE ONE TABLET UNDER TONGUE AS NEEDED FOR CHEST PAIN - MAY REPEAT TWICE-IF NO RELIEF GO TO NEAREST HOSPITAL ER   nystatin  powder Commonly known as: MYCOSTATIN /NYSTOP  Apply topically 3 (three) times  daily. What changed:  how much to take when to take this reasons to take this   pantoprazole  40 MG tablet Commonly known as: Protonix  Take 1 tablet (40 mg total) by mouth daily.   potassium chloride  SA 20 MEQ tablet Commonly known as: KLOR-CON  M 40meq BID X 2 days, then decrease to 20meq daily What changed:  how much to take how to take this when to take this additional instructions   predniSONE  10 MG tablet Commonly known as: DELTASONE  Take 4 tablets (40 mg total) by mouth daily for 3 days. What changed: You were already taking a medication with the same name, and this prescription was added. Make sure you understand how and when to take each.   predniSONE  20 MG tablet Commonly known as: DELTASONE  Take 1 tablet (20 mg total) by mouth daily. Start taking on: October 13, 2024 What changed: These instructions start on October 13, 2024. If  you are unsure what to do until then, ask your doctor or other care provider.   rosuvastatin  20 MG tablet Commonly known as: CRESTOR  Take 20 mg by mouth daily.   Stiolto Respimat 2.5-2.5 MCG/ACT Aers Generic drug: Tiotropium Bromide-Olodaterol Inhale 2 puffs into the lungs daily.        Contact information for after-discharge care     Destination     [facility] .   Service: Skilled Nursing Contact information: [address] [place]  [address] [phone]                    Discharge Exam: Fredricka Weights   10/06/24 1358  Weight: 85.2 kg   General exam: ill appearing elderly woman not in distress.  Respiratory system: Clear to auscultation. Respiratory effort normal. Cardiovascular system: S1 & S2 heard, RRR.  Gastrointestinal system: Abdomen is nondistended, soft and nontender.  Central nervous system: Alert and oriented.  Extremities: trace edema.  Skin: No rashes,  Psychiatry: Mood & affect appropriate.    Condition at discharge: fair  The results of  significant diagnostics from this hospitalization (including imaging, microbiology, ancillary and laboratory) are listed below for reference.   Imaging Studies: US  RENAL Result Date: 10/08/2024 CLINICAL DATA:  Acute kidney injury. EXAM: RENAL / URINARY TRACT ULTRASOUND COMPLETE COMPARISON:  March 04, 2022 FINDINGS: Right Kidney: Renal measurements: 10.5 cm x 5.8 cm x 5.4 cm = volume: 172.64 mL. Echogenicity within normal limits. No mass or hydronephrosis visualized. Left Kidney: Renal measurements: 7.9 cm x 4.3 cm x 3.7 cm = volume: 61.5 mL. There is increased echogenicity of the renal parenchyma. No mass or hydronephrosis visualized. Bladder: Appears normal for degree of bladder distention. Other: None. IMPRESSION: Mildly atrophic echogenic left kidney. This represents a new finding when compared to the prior study and may represent sequelae associated with medical renal disease. Electronically Signed   By: Suzen Dials M.D.   On: 10/08/2024 22:02   DG Chest 1 View Result Date: 10/06/2024 EXAM: 1 VIEW(S) XRAY OF THE CHEST 10/06/2024 02:17:00 PM COMPARISON: 09/02/2024 CLINICAL HISTORY: sob. Reason for exam: sob; Per triage notes: PT to etc via [facility] from home with co sob that stared recently. PT has also had multiple falls recently. PT denies hitting her head but had been dizzy when she stands. PT says she also has tingling in her chest. FINDINGS: LUNGS AND PLEURA: No focal pulmonary opacity. Nodular density in right upper lung unchanged since 09/26/2023 favors a benign process. No pulmonary edema. No pleural effusion. No pneumothorax. HEART AND MEDIASTINUM: Cardiomegaly. Aortic calcification. BONES AND SOFT TISSUES: No acute osseous abnormality. IMPRESSION: 1. No acute cardiopulmonary process identified. Electronically signed by: Waddell Calk MD 10/06/2024 03:40 PM EDT RP Workstation: [id number]    Microbiology: Results for orders placed or performed during the hospital encounter of  10/06/24  Resp panel by RT-PCR (RSV, Flu A&B, Covid) Anterior Nasal Swab     Status: None   Collection Time: 10/06/24  2:00 PM   Specimen: Anterior Nasal Swab  Result Value Ref Range Status   SARS Coronavirus 2 by RT PCR NEGATIVE NEGATIVE Final   Influenza A by PCR NEGATIVE NEGATIVE Final   Influenza B by PCR NEGATIVE NEGATIVE Final    Comment: (NOTE) The Xpert Xpress SARS-CoV-2/FLU/RSV plus assay is intended as an aid in the diagnosis of influenza from Nasopharyngeal swab specimens and should not be used as a sole basis for treatment. Nasal washings and aspirates  are unacceptable for Xpert Xpress SARS-CoV-2/FLU/RSV testing.  Fact Sheet for Patients: bloggercourse.com  Fact Sheet for Healthcare Providers: seriousbroker.it  This test is not yet approved or cleared by the [place]  FDA and has been authorized for detection and/or diagnosis of SARS-CoV-2 by FDA under an Emergency Use Authorization (EUA). This EUA will remain in effect (meaning this test can be used) for the duration of the COVID-19 declaration under Section 564(b)(1) of the Act, 21 U.S.C. section 360bbb-3(b)(1), unless the authorization is terminated or revoked.     Resp Syncytial Virus by PCR NEGATIVE NEGATIVE Final    Comment: (NOTE) Fact Sheet for Patients: bloggercourse.com  Fact Sheet for Healthcare Providers: seriousbroker.it  This test is not yet approved or cleared by the [place]  FDA and has been authorized for detection and/or diagnosis of SARS-CoV-2 by FDA under an Emergency Use Authorization (EUA). This EUA will remain in effect (meaning this test can be used) for the duration of the COVID-19 declaration under Section 564(b)(1) of the Act, 21 U.S.C. section 360bbb-3(b)(1), unless the authorization is terminated or revoked.  Performed at [facility], [address], [place],  [place] [address]   MRSA Next Gen by PCR, Nasal     Status: None   Collection Time: 10/07/24 11:14 AM   Specimen: Nasal Mucosa; Nasal Swab  Result Value Ref Range Status   MRSA by PCR Next Gen NOT DETECTED NOT DETECTED Final    Comment: (NOTE) The GeneXpert MRSA Assay (FDA approved for NASAL specimens only), is one component of a comprehensive MRSA colonization surveillance program. It is not intended to diagnose MRSA infection nor to guide or monitor treatment for MRSA infections. Test performance is not FDA approved in patients less than 47 years old. Performed at [facility], [address] [place], [place] [address]     Labs: CBC: Recent Labs  Lab 10/06/24 1358 10/06/24 1434 10/07/24 0540 10/08/24 0246  WBC 9.6  --  10.0 18.1*  NEUTROABS 8.4*  --   --  17.0*  HGB 11.8* 12.9 10.5* 10.2*  HCT 36.9 38.0 33.5* 33.4*  MCV 82.6  --  84.0 86.5  PLT 265  --  242 283   Basic Metabolic Panel: Recent Labs  Lab 10/06/24 1358 10/06/24 1434 10/06/24 1616 10/07/24 0540 10/08/24 0246 10/09/24 0939  NA 130* 126*  --  132* 132* 131*  K 2.4* 2.2*  --  3.5 4.7 4.3  CL 76* 77*  --  83* 88* 90*  CO2 33*  --   --  29 29 25   GLUCOSE 167* 177*  --  317* 248* 298*  BUN 88* 86*  --  75* 72* 58*  CREATININE 2.10* 2.20*  --  2.06* 2.37* 1.82*  CALCIUM  7.4*  --   --  7.4* 8.2* 8.0*  MG  --   --  2.8*  --   --   --   PHOS  --   --  5.4*  --   --   --    Liver Function Tests: No results for input(s): AST, ALT, ALKPHOS, BILITOT, PROT, ALBUMIN in the last 168 hours. CBG: Recent Labs  Lab 10/08/24 1209 10/08/24 1702 10/08/24 2109 10/09/24 0621 10/09/24 1139  GLUCAP 255* 157* 194* 109* 305*    Discharge time spent: 41 minutes.   Signed: Elgie Butter, MD [facility] 10/09/2024

## 2024-10-09 NOTE — TOC Transition Note (Signed)
 Transition of Care [facility]) - Discharge Note   Patient Details  Name: Jenny [name] MRN: [id number] Date of Birth: [birth year]  [facility] CM/SW Contact:  [name], LCSWA Phone Number: 10/09/2024, 1:03 PM   Clinical Narrative:   [facility] Anticipated DC date: 10/09/24  Family notified: Ozell (son) Transport by: Ellouise (family)   Per MD patient ready for DC to [facility]. RN to call report prior to discharge [phone]; room 702). RN, patient, patient's family, and facility notified of DC. Discharge Summary and FL2 sent to facility.   Per Ozell, patients family member Clabe) will be picking patient up around 2PM. Ozell stated patient will need assistance getting to car and getting out of the car. CSW notified bedside RN and facility.    CSW will sign off for now as social work intervention is no longer needed. Please consult us  again if new needs arise.      Final next level of care: Skilled Nursing Facility Barriers to Discharge: Barriers Resolved   Patient Goals and CMS Choice Patient states their goals for this hospitalization and ongoing recovery are:: SNF CMS Medicare.gov Compare Post Acute Care list provided to:: Patient Choice offered to / list presented to : Adult Children, Patient      Discharge Placement PASRR number recieved: 10/07/24            Patient [facility] bed at: [facility] Patient to be transferred to facility by: Ellouise (family) Name of family member notified: Ozell (son) Patient and family notified of of transfer: 10/09/24  Discharge Plan and Services Additional resources added to the After Visit Summary for   In-house Referral: Clinical Social Work   Post Acute Care Choice: Skilled Nursing Facility                               Social Drivers of Health (SDOH) Interventions SDOH Screenings   Food Insecurity: No Food Insecurity (10/07/2024)  Housing: Low Risk  (10/07/2024)   Transportation Needs: No Transportation Needs (10/07/2024)  Utilities: Not At Risk (10/07/2024)  Alcohol Screen: Low Risk  (07/02/2024)  Depression (PHQ2-9): Low Risk  (07/22/2022)  Financial Resource Strain: Low Risk  (07/02/2024)  Social Connections: Socially Isolated (10/07/2024)  Tobacco Use: Medium Risk (10/06/2024)     Readmission Risk Interventions    07/05/2024   10:24 AM  Readmission Risk Prevention Plan  Transportation Screening Complete  HRI or Home Care Consult Complete  Social Work Consult for Recovery Care Planning/Counseling Complete  Palliative Care Screening Not Applicable  Medication Review Oceanographer) Complete

## 2024-10-20 ENCOUNTER — Telehealth: Payer: Self-pay | Admitting: Family
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
## 2024-10-20 NOTE — Telephone Encounter (Signed)
 Called to confirm/remind patient of their appointment at the [facility] on 10/21/24.   Appointment:   [x] Confirmed  [] Left mess   [] No answer/No voice mail  [] VM Full/unable to leave message  [] Phone not in service  Patient reminded to bring all medications and/or complete list.  Confirmed patient has transportation. Gave directions, instructed to utilize valet parking.

## 2024-10-21 ENCOUNTER — Encounter: Payer: Self-pay | Admitting: Family

## 2024-10-21 ENCOUNTER — Ambulatory Visit: Attending: Family | Admitting: Family

## 2024-10-21 VITALS — BP 139/87 | HR 73

## 2024-10-21 DIAGNOSIS — Z7952 Long term (current) use of systemic steroids: Secondary | ICD-10-CM | POA: Diagnosis not present

## 2024-10-21 DIAGNOSIS — D72829 Elevated white blood cell count, unspecified: Secondary | ICD-10-CM | POA: Diagnosis not present

## 2024-10-21 DIAGNOSIS — E876 Hypokalemia: Secondary | ICD-10-CM | POA: Insufficient documentation

## 2024-10-21 DIAGNOSIS — Z79899 Other long term (current) drug therapy: Secondary | ICD-10-CM | POA: Diagnosis not present

## 2024-10-21 DIAGNOSIS — N179 Acute kidney failure, unspecified: Secondary | ICD-10-CM | POA: Diagnosis not present

## 2024-10-21 DIAGNOSIS — N184 Chronic kidney disease, stage 4 (severe): Secondary | ICD-10-CM | POA: Diagnosis not present

## 2024-10-21 DIAGNOSIS — J441 Chronic obstructive pulmonary disease with (acute) exacerbation: Secondary | ICD-10-CM | POA: Diagnosis not present

## 2024-10-21 DIAGNOSIS — I130 Hypertensive heart and chronic kidney disease with heart failure and stage 1 through stage 4 chronic kidney disease, or unspecified chronic kidney disease: Secondary | ICD-10-CM | POA: Diagnosis present

## 2024-10-21 DIAGNOSIS — I428 Other cardiomyopathies: Secondary | ICD-10-CM | POA: Diagnosis not present

## 2024-10-21 DIAGNOSIS — I2489 Other forms of acute ischemic heart disease: Secondary | ICD-10-CM | POA: Insufficient documentation

## 2024-10-21 DIAGNOSIS — I2510 Atherosclerotic heart disease of native coronary artery without angina pectoris: Secondary | ICD-10-CM | POA: Diagnosis not present

## 2024-10-21 DIAGNOSIS — I4819 Other persistent atrial fibrillation: Secondary | ICD-10-CM | POA: Insufficient documentation

## 2024-10-21 DIAGNOSIS — I272 Pulmonary hypertension, unspecified: Secondary | ICD-10-CM | POA: Diagnosis present

## 2024-10-21 DIAGNOSIS — R630 Anorexia: Secondary | ICD-10-CM | POA: Diagnosis not present

## 2024-10-21 DIAGNOSIS — E1122 Type 2 diabetes mellitus with diabetic chronic kidney disease: Secondary | ICD-10-CM | POA: Insufficient documentation

## 2024-10-21 DIAGNOSIS — Z87891 Personal history of nicotine dependence: Secondary | ICD-10-CM | POA: Diagnosis not present

## 2024-10-21 DIAGNOSIS — B372 Candidiasis of skin and nail: Secondary | ICD-10-CM | POA: Insufficient documentation

## 2024-10-21 DIAGNOSIS — K921 Melena: Secondary | ICD-10-CM | POA: Diagnosis not present

## 2024-10-21 DIAGNOSIS — I5032 Chronic diastolic (congestive) heart failure: Secondary | ICD-10-CM | POA: Diagnosis not present

## 2024-10-21 DIAGNOSIS — Z7901 Long term (current) use of anticoagulants: Secondary | ICD-10-CM | POA: Diagnosis not present

## 2024-10-21 DIAGNOSIS — H409 Unspecified glaucoma: Secondary | ICD-10-CM | POA: Insufficient documentation

## 2024-10-21 DIAGNOSIS — I10 Essential (primary) hypertension: Secondary | ICD-10-CM | POA: Diagnosis not present

## 2024-10-21 DIAGNOSIS — E8770 Fluid overload, unspecified: Secondary | ICD-10-CM | POA: Diagnosis not present

## 2024-10-21 DIAGNOSIS — E785 Hyperlipidemia, unspecified: Secondary | ICD-10-CM | POA: Insufficient documentation

## 2024-10-21 DIAGNOSIS — J449 Chronic obstructive pulmonary disease, unspecified: Secondary | ICD-10-CM

## 2024-10-21 DIAGNOSIS — I509 Heart failure, unspecified: Secondary | ICD-10-CM | POA: Diagnosis not present

## 2024-10-21 DIAGNOSIS — R7989 Other specified abnormal findings of blood chemistry: Secondary | ICD-10-CM | POA: Insufficient documentation

## 2024-10-21 LAB — BASIC METABOLIC PANEL WITH GFR
BUN/Creatinine Ratio: 24 (ref 12–28)
BUN: 48 mg/dL — ABNORMAL HIGH (ref 8–27)
CO2: 28 mmol/L (ref 20–29)
Calcium: 8.2 mg/dL — ABNORMAL LOW (ref 8.7–10.3)
Chloride: 91 mmol/L — ABNORMAL LOW (ref 96–106)
Creatinine, Ser: 1.96 mg/dL — ABNORMAL HIGH (ref 0.57–1.00)
Glucose: 156 mg/dL — ABNORMAL HIGH (ref 70–99)
Potassium: 3.7 mmol/L (ref 3.5–5.2)
Sodium: 139 mmol/L (ref 134–144)
eGFR: 24 mL/min/1.73 — ABNORMAL LOW (ref >59)

## 2024-10-21 MED ORDER — POTASSIUM CHLORIDE CRYS ER 20 MEQ PO TBCR: EXTENDED_RELEASE_TABLET | ORAL | Status: DC

## 2024-10-21 NOTE — Patient Instructions (Signed)
 Go downstairs to [facility] to get your lab work drawn. It's down on the lower level (LL).  We will only call you if medications need to be changed.

## 2024-10-22 ENCOUNTER — Ambulatory Visit: Payer: Self-pay | Admitting: Family

## 2024-10-27 ENCOUNTER — Other Ambulatory Visit: Payer: Self-pay | Admitting: Family

## 2024-10-27 MED ORDER — METOLAZONE 2.5 MG PO TABS: 2.5000 mg | ORAL_TABLET | ORAL | 0 refills | Status: DC | PRN

## 2024-10-27 NOTE — Progress Notes (Deleted)
 New Patient Pulmonology Office Visit   Subjective:  Patient ID: Jenny [name], female    DOB: 1933-10-13  MRN: [id number]  Referred by: Thermon Almarie NOVAK, FNP  CC: No chief complaint on file.   HPI Jenny [name] is a 89 y.o. female with ***  {PULM QUESTIONNAIRES (Optional):33196}  ROS  Allergies: Aspirin , Entresto  [sacubitril -valsartan ], Atorvastatin , Cyclobenzaprine, Lactose intolerance (gi), Meperidine hcl, Pravastatin , Propoxyphene, and Wound dressing adhesive  Current Outpatient Medications:    acetaminophen  (TYLENOL ) 500 MG tablet, Take 1,000 mg by mouth every 6 (six) hours as needed for moderate pain (pain score 4-6)., Disp: , Rfl:    acetaminophen -codeine  (TYLENOL  #2) 300-15 MG tablet, Take 1 tablet by mouth every 8 (eight) hours as needed for moderate pain (pain score 4-6)., Disp: , Rfl:    apixaban  (ELIQUIS ) 2.5 MG TABS tablet, Take 1 tablet (2.5 mg total) by mouth 2 (two) times daily., Disp: 180 tablet, Rfl: 3   dorzolamide  (TRUSOPT ) 2 % ophthalmic solution, Place 1 drop into both eyes 2 (two) times daily., Disp: , Rfl:    furosemide  (LASIX ) 40 MG tablet, Take 1 tablet (40 mg total) by mouth 2 (two) times daily., Disp: 60 tablet, Rfl: 0   gabapentin  (NEURONTIN ) 100 MG capsule, Take 100-200 mg by mouth See admin instructions. Take 1 capsule (100mg ) by mouth every morning and take 2 capsules (200mg ) by mouth at bedtime, Disp: , Rfl:    hydrOXYzine  (ATARAX ) 25 MG tablet, Take 25 mg by mouth every 8 (eight) hours as needed for itching., Disp: , Rfl:    ipratropium-albuterol  (DUONEB) 0.5-2.5 (3) MG/3ML SOLN, Take 3 mLs by nebulization every 6 (six) hours as needed., Disp: 360 mL, Rfl: 1   latanoprost  (XALATAN ) 0.005 % ophthalmic solution, Place 1 drop into both eyes at bedtime., Disp: , Rfl:    metolazone  (ZAROXOLYN ) 2.5 MG tablet, Take 1 tablet (2.5 mg total) by mouth as needed., Disp: 20 tablet, Rfl: 0   NITROSTAT  0.4 MG SL tablet, DISSOLVE ONE TABLET UNDER TONGUE AS NEEDED  FOR CHEST PAIN - MAY REPEAT TWICE-IF NO RELIEF GO TO NEAREST HOSPITAL ER, Disp: 25 tablet, Rfl: 0   nystatin  (MYCOSTATIN /NYSTOP ) powder, Apply topically 3 (three) times daily. (Patient taking differently: Apply 1 Application topically daily as needed (rash).), Disp: 15 g, Rfl: 0   pantoprazole  (PROTONIX ) 40 MG tablet, Take 1 tablet (40 mg total) by mouth daily., Disp: 90 tablet, Rfl: 3   potassium chloride  SA (KLOR-CON  M) 20 MEQ tablet, Taking 40meq daily, Disp: , Rfl:    predniSONE  (DELTASONE ) 20 MG tablet, Take 1 tablet (20 mg total) by mouth daily., Disp: , Rfl:    rosuvastatin  (CRESTOR ) 20 MG tablet, Take 20 mg by mouth daily., Disp: , Rfl:    Tiotropium Bromide-Olodaterol (STIOLTO RESPIMAT) 2.5-2.5 MCG/ACT AERS, Inhale 2 puffs into the lungs daily., Disp: , Rfl:  Past Medical History:  Diagnosis Date   ACUT DUOD ULCER W/HEMORR&PERF W/O MENTION OBST 10/05/2009   NSAID induced   ALLERGIC RHINITIS CAUSE UNSPECIFIED    ANEMIA-NOS    CAD (coronary artery disease) 06/08/2009   DEs RCA with 70% LAD and EF 60%   CHF (congestive heart failure) (HCC)    COPD    mild obst on PFTs 03/2010   Diabetes mellitus 06/2010 dx   Mild, diet controlled   GLAUCOMA    HYPERLIPIDEMIA    HYPERTENSION, BENIGN    MYOCARDIAL INFARCTION 06/08/2009   des to rca   Persistent atrial fibrillation (HCC)  Dx 08/2021   TOBACCO ABUSE    Past Surgical History:  Procedure Laterality Date   CARDIOVERSION N/A 01/30/2022   Procedure: CARDIOVERSION;  Surgeon: Nahser, Aleene PARAS, MD;  Location: [facility] ENDOSCOPY;  Service: Cardiovascular;  Laterality: N/A;   HEMORRHOID SURGERY  1990   Right knee surgery     Family History  Problem Relation Age of Onset   Hypertension Mother    Ulcers Mother    Stomach cancer Father    Stroke Maternal Grandmother    Heart attack Neg Hx    Social History   Socioeconomic History   Marital status: Widowed    Spouse name: Not on file   Number of children: Not on file   Years of  education: Not on file   Highest education level: Not on file  Occupational History   Not on file  Tobacco Use   Smoking status: Former    Current packs/day: 0.00    Average packs/day: 0.5 packs/day for 60.0 years (30.0 ttl pk-yrs)    Types: Cigarettes    Start date: 11/12/1961    Quit date: 11/12/2021    Years since quitting: 2.9   Smokeless tobacco: Never   Tobacco comments:    She lives in [place] with her significant other (Charles Hook)0  Vaping Use   Vaping status: Never Used  Substance and Sexual Activity   Alcohol use: Yes    Alcohol/week: 1.0 - 2.0 standard drink of alcohol    Types: 1 - 2 Glasses of wine per week    Comment: once or twice a year glass of wine 12/25/21   Drug use: No   Sexual activity: Not on file  Other Topics Concern   Not on file  Social History Narrative   Lives in [place] with her SO - charles hook   Retired -acupuncturist   Enjoys travel - live engineer, maintenance (it)   Social Drivers of Corporate Investment Banker Strain: Low Risk  (07/02/2024)   Overall Financial Resource Strain (CARDIA)    Difficulty of Paying Living Expenses: Not very hard  Food Insecurity: No Food Insecurity (10/07/2024)   Hunger Vital Sign    Worried About Running Out of Food in the Last Year: Never true    Ran Out of Food in the Last Year: Never true  Transportation Needs: No Transportation Needs (10/07/2024)   PRAPARE - Administrator, Civil Service (Medical): No    Lack of Transportation (Non-Medical): No  Physical Activity: Not on file  Stress: Not on file  Social Connections: Socially Isolated (10/07/2024)   Social Connection and Isolation Panel    Frequency of Communication with Friends and Family: More than three times a week    Frequency of Social Gatherings with Friends and Family: Three times a week    Attends Religious Services: Never    Active Member of Clubs or Organizations: No    Attends Banker Meetings: Never    Marital  Status: Widowed  Intimate Partner Violence: Not At Risk (10/07/2024)   Humiliation, Afraid, Rape, and Kick questionnaire    Fear of Current or Ex-Partner: No    Emotionally Abused: No    Physically Abused: No    Sexually Abused: No       Objective:  There were no vitals taken for this visit. {Pulm Vitals (Optional):32837}  Physical Exam  Diagnostic Review:  {Labs (Optional):32838}  PFT 2011: mild COPD + bronchodilator response, hyperinflation    Assessment & Plan:  Assessment & Plan   No orders of the defined types were placed in this encounter.     No follow-ups on file.   Kiyah Demartini, MD

## 2024-10-28 ENCOUNTER — Emergency Department (HOSPITAL_COMMUNITY)

## 2024-10-28 ENCOUNTER — Encounter (HOSPITAL_COMMUNITY): Payer: Self-pay

## 2024-10-28 ENCOUNTER — Other Ambulatory Visit: Payer: Self-pay

## 2024-10-28 ENCOUNTER — Ambulatory Visit (HOSPITAL_BASED_OUTPATIENT_CLINIC_OR_DEPARTMENT_OTHER): Admitting: Pulmonary Disease

## 2024-10-28 ENCOUNTER — Inpatient Hospital Stay (HOSPITAL_COMMUNITY)
Admission: EM | Admit: 2024-10-28 | Discharge: 2024-11-02 | DRG: 682 | Disposition: A | Discharge status: Skilled Nursing Facility | Attending: Family Medicine | Admitting: Family Medicine

## 2024-10-28 ENCOUNTER — Encounter (HOSPITAL_BASED_OUTPATIENT_CLINIC_OR_DEPARTMENT_OTHER): Payer: Self-pay

## 2024-10-28 DIAGNOSIS — Z66 Do not resuscitate: Secondary | ICD-10-CM | POA: Diagnosis present

## 2024-10-28 DIAGNOSIS — I2510 Atherosclerotic heart disease of native coronary artery without angina pectoris: Secondary | ICD-10-CM | POA: Diagnosis present

## 2024-10-28 DIAGNOSIS — J9601 Acute respiratory failure with hypoxia: Secondary | ICD-10-CM | POA: Diagnosis present

## 2024-10-28 DIAGNOSIS — B372 Candidiasis of skin and nail: Secondary | ICD-10-CM | POA: Diagnosis present

## 2024-10-28 DIAGNOSIS — N179 Acute kidney failure, unspecified: Secondary | ICD-10-CM

## 2024-10-28 DIAGNOSIS — Z886 Allergy status to analgesic agent status: Secondary | ICD-10-CM

## 2024-10-28 DIAGNOSIS — D631 Anemia in chronic kidney disease: Secondary | ICD-10-CM | POA: Diagnosis present

## 2024-10-28 DIAGNOSIS — N170 Acute kidney failure with tubular necrosis: Secondary | ICD-10-CM | POA: Diagnosis present

## 2024-10-28 DIAGNOSIS — E785 Hyperlipidemia, unspecified: Secondary | ICD-10-CM | POA: Diagnosis present

## 2024-10-28 DIAGNOSIS — Q249 Congenital malformation of heart, unspecified: Secondary | ICD-10-CM | POA: Diagnosis not present

## 2024-10-28 DIAGNOSIS — I428 Other cardiomyopathies: Secondary | ICD-10-CM | POA: Diagnosis present

## 2024-10-28 DIAGNOSIS — Z87891 Personal history of nicotine dependence: Secondary | ICD-10-CM

## 2024-10-28 DIAGNOSIS — Z6834 Body mass index (BMI) 34.0-34.9, adult: Secondary | ICD-10-CM

## 2024-10-28 DIAGNOSIS — H409 Unspecified glaucoma: Secondary | ICD-10-CM | POA: Diagnosis present

## 2024-10-28 DIAGNOSIS — I509 Heart failure, unspecified: Secondary | ICD-10-CM | POA: Diagnosis not present

## 2024-10-28 DIAGNOSIS — Z888 Allergy status to other drugs, medicaments and biological substances status: Secondary | ICD-10-CM

## 2024-10-28 DIAGNOSIS — I495 Sick sinus syndrome: Secondary | ICD-10-CM | POA: Diagnosis present

## 2024-10-28 DIAGNOSIS — E1140 Type 2 diabetes mellitus with diabetic neuropathy, unspecified: Secondary | ICD-10-CM | POA: Diagnosis present

## 2024-10-28 DIAGNOSIS — Z91011 Allergy to milk products, unspecified: Secondary | ICD-10-CM

## 2024-10-28 DIAGNOSIS — I5032 Chronic diastolic (congestive) heart failure: Secondary | ICD-10-CM | POA: Diagnosis present

## 2024-10-28 DIAGNOSIS — W010XXA Fall on same level from slipping, tripping and stumbling without subsequent striking against object, initial encounter: Secondary | ICD-10-CM | POA: Diagnosis present

## 2024-10-28 DIAGNOSIS — Z91048 Other nonmedicinal substance allergy status: Secondary | ICD-10-CM

## 2024-10-28 DIAGNOSIS — Z881 Allergy status to other antibiotic agents status: Secondary | ICD-10-CM

## 2024-10-28 DIAGNOSIS — I130 Hypertensive heart and chronic kidney disease with heart failure and stage 1 through stage 4 chronic kidney disease, or unspecified chronic kidney disease: Secondary | ICD-10-CM | POA: Diagnosis present

## 2024-10-28 DIAGNOSIS — E1122 Type 2 diabetes mellitus with diabetic chronic kidney disease: Secondary | ICD-10-CM | POA: Diagnosis present

## 2024-10-28 DIAGNOSIS — N184 Chronic kidney disease, stage 4 (severe): Secondary | ICD-10-CM | POA: Diagnosis present

## 2024-10-28 DIAGNOSIS — Z7901 Long term (current) use of anticoagulants: Secondary | ICD-10-CM | POA: Diagnosis not present

## 2024-10-28 DIAGNOSIS — E11649 Type 2 diabetes mellitus with hypoglycemia without coma: Secondary | ICD-10-CM | POA: Diagnosis present

## 2024-10-28 DIAGNOSIS — Z79899 Other long term (current) drug therapy: Secondary | ICD-10-CM

## 2024-10-28 DIAGNOSIS — I252 Old myocardial infarction: Secondary | ICD-10-CM | POA: Diagnosis not present

## 2024-10-28 DIAGNOSIS — I4819 Other persistent atrial fibrillation: Secondary | ICD-10-CM | POA: Diagnosis present

## 2024-10-28 DIAGNOSIS — Z8249 Family history of ischemic heart disease and other diseases of the circulatory system: Secondary | ICD-10-CM

## 2024-10-28 DIAGNOSIS — J441 Chronic obstructive pulmonary disease with (acute) exacerbation: Secondary | ICD-10-CM | POA: Diagnosis present

## 2024-10-28 DIAGNOSIS — E876 Hypokalemia: Secondary | ICD-10-CM | POA: Diagnosis present

## 2024-10-28 DIAGNOSIS — R911 Solitary pulmonary nodule: Secondary | ICD-10-CM | POA: Diagnosis present

## 2024-10-28 DIAGNOSIS — E669 Obesity, unspecified: Secondary | ICD-10-CM | POA: Diagnosis present

## 2024-10-28 DIAGNOSIS — Z7951 Long term (current) use of inhaled steroids: Secondary | ICD-10-CM

## 2024-10-28 DIAGNOSIS — Z7952 Long term (current) use of systemic steroids: Secondary | ICD-10-CM

## 2024-10-28 DIAGNOSIS — S51811A Laceration without foreign body of right forearm, initial encounter: Secondary | ICD-10-CM

## 2024-10-28 DIAGNOSIS — Z1152 Encounter for screening for COVID-19: Secondary | ICD-10-CM | POA: Diagnosis not present

## 2024-10-28 DIAGNOSIS — N320 Bladder-neck obstruction: Secondary | ICD-10-CM | POA: Diagnosis present

## 2024-10-28 DIAGNOSIS — W19XXXA Unspecified fall, initial encounter: Principal | ICD-10-CM

## 2024-10-28 LAB — CBC WITH DIFFERENTIAL/PLATELET
Abs Immature Granulocytes: 0.13 K/uL — ABNORMAL HIGH (ref 0.00–0.07)
Basophils Absolute: 0 K/uL (ref 0.0–0.1)
Basophils Relative: 0 %
Eosinophils Absolute: 0 K/uL (ref 0.0–0.5)
Eosinophils Relative: 0 %
HCT: 39.5 % (ref 36.0–46.0)
Hemoglobin: 12.2 g/dL (ref 12.0–15.0)
Immature Granulocytes: 1 %
Lymphocytes Relative: 10 %
Lymphs Abs: 1 K/uL (ref 0.7–4.0)
MCH: 26 pg (ref 26.0–34.0)
MCHC: 30.9 g/dL (ref 30.0–36.0)
MCV: 84 fL (ref 80.0–100.0)
Monocytes Absolute: 0.7 K/uL (ref 0.1–1.0)
Monocytes Relative: 6 %
Neutro Abs: 9.1 K/uL — ABNORMAL HIGH (ref 1.7–7.7)
Neutrophils Relative %: 83 %
Platelets: 302 K/uL (ref 150–400)
RBC: 4.7 MIL/uL (ref 3.87–5.11)
RDW: 19.1 % — ABNORMAL HIGH (ref 11.5–15.5)
WBC: 10.9 K/uL — ABNORMAL HIGH (ref 4.0–10.5)
nRBC: 0.4 % — ABNORMAL HIGH (ref 0.0–0.2)

## 2024-10-28 LAB — BASIC METABOLIC PANEL WITH GFR
Anion gap: 24 — ABNORMAL HIGH (ref 5–15)
BUN: 69 mg/dL — ABNORMAL HIGH (ref 8–23)
CO2: 26 mmol/L (ref 22–32)
Calcium: 7 mg/dL — ABNORMAL LOW (ref 8.9–10.3)
Chloride: 84 mmol/L — ABNORMAL LOW (ref 98–111)
Creatinine, Ser: 3.2 mg/dL — ABNORMAL HIGH (ref 0.44–1.00)
GFR, Estimated: 13 mL/min — ABNORMAL LOW (ref >60)
Glucose, Bld: 219 mg/dL — ABNORMAL HIGH (ref 70–99)
Potassium: 3.3 mmol/L — ABNORMAL LOW (ref 3.5–5.1)
Sodium: 134 mmol/L — ABNORMAL LOW (ref 135–145)

## 2024-10-28 LAB — RESP PANEL BY RT-PCR (RSV, FLU A&B, COVID)  RVPGX2
Influenza A by PCR: NEGATIVE
Influenza B by PCR: NEGATIVE
Resp Syncytial Virus by PCR: NEGATIVE
SARS Coronavirus 2 by RT PCR: NEGATIVE

## 2024-10-28 LAB — CK: Total CK: 334 U/L — ABNORMAL HIGH (ref 38–234)

## 2024-10-28 LAB — BRAIN NATRIURETIC PEPTIDE: B Natriuretic Peptide: 84.1 pg/mL (ref 0.0–100.0)

## 2024-10-28 MED ORDER — SODIUM CHLORIDE 0.9% FLUSH
3.0000 mL | Freq: Two times a day (BID) | INTRAVENOUS | Status: DC
  Administered 2024-10-28 – 2024-11-02 (×6): 3 mL via INTRAVENOUS

## 2024-10-28 MED ORDER — POTASSIUM CHLORIDE CRYS ER 20 MEQ PO TBCR: 40.0000 meq | EXTENDED_RELEASE_TABLET | Freq: Once | ORAL | Status: DC

## 2024-10-28 MED ORDER — ACETAMINOPHEN 500 MG PO TABS
1000.0000 mg | ORAL_TABLET | Freq: Four times a day (QID) | ORAL | Status: DC | PRN
  Administered 2024-10-29 – 2024-11-02 (×6): 1000 mg via ORAL
  Filled 2024-10-28 (×6): qty 2

## 2024-10-28 MED ORDER — ALBUTEROL SULFATE (2.5 MG/3ML) 0.083% IN NEBU
5.0000 mg | INHALATION_SOLUTION | Freq: Once | RESPIRATORY_TRACT | Status: AC
  Administered 2024-10-28: 5 mg via RESPIRATORY_TRACT
  Filled 2024-10-28: qty 6

## 2024-10-28 MED ORDER — METHYLPREDNISOLONE SODIUM SUCC 125 MG IJ SOLR
125.0000 mg | Freq: Once | INTRAMUSCULAR | Status: AC
  Administered 2024-10-28: 125 mg via INTRAVENOUS
  Filled 2024-10-28: qty 2

## 2024-10-28 MED ORDER — POTASSIUM CHLORIDE CRYS ER 20 MEQ PO TBCR
40.0000 meq | EXTENDED_RELEASE_TABLET | Freq: Every day | ORAL | Status: DC
  Administered 2024-10-28 – 2024-11-02 (×6): 40 meq via ORAL
  Filled 2024-10-28 (×6): qty 2

## 2024-10-28 MED ORDER — ARFORMOTEROL TARTRATE 15 MCG/2ML IN NEBU
15.0000 ug | INHALATION_SOLUTION | Freq: Two times a day (BID) | RESPIRATORY_TRACT | Status: DC
  Administered 2024-10-29 – 2024-11-02 (×9): 15 ug via RESPIRATORY_TRACT
  Filled 2024-10-28 (×9): qty 2

## 2024-10-28 MED ORDER — LATANOPROST 0.005 % OP SOLN
1.0000 [drp] | Freq: Every day | OPHTHALMIC | Status: DC
  Administered 2024-10-29 – 2024-11-01 (×4): 1 [drp] via OPHTHALMIC
  Filled 2024-10-28: qty 2.5

## 2024-10-28 MED ORDER — DORZOLAMIDE HCL 2 % OP SOLN
1.0000 [drp] | Freq: Two times a day (BID) | OPHTHALMIC | Status: DC
  Administered 2024-10-29 – 2024-11-02 (×9): 1 [drp] via OPHTHALMIC
  Filled 2024-10-28: qty 10

## 2024-10-28 MED ORDER — DOXYCYCLINE HYCLATE 100 MG PO TABS
100.0000 mg | ORAL_TABLET | Freq: Two times a day (BID) | ORAL | Status: DC
  Administered 2024-10-28: 100 mg via ORAL
  Filled 2024-10-28: qty 1

## 2024-10-28 MED ORDER — APIXABAN 2.5 MG PO TABS
2.5000 mg | ORAL_TABLET | Freq: Two times a day (BID) | ORAL | Status: DC
  Administered 2024-10-28 – 2024-11-02 (×10): 2.5 mg via ORAL
  Filled 2024-10-28 (×10): qty 1

## 2024-10-28 MED ORDER — GABAPENTIN 100 MG PO CAPS
100.0000 mg | ORAL_CAPSULE | Freq: Every morning | ORAL | Status: DC
  Administered 2024-10-29 – 2024-11-02 (×5): 100 mg via ORAL
  Filled 2024-10-28 (×5): qty 1

## 2024-10-28 MED ORDER — SODIUM CHLORIDE 0.9 % IV BOLUS: 500.0000 mL | Freq: Once | INTRAVENOUS | Status: DC

## 2024-10-28 MED ORDER — BACITRACIN ZINC 500 UNIT/GM EX OINT
TOPICAL_OINTMENT | Freq: Two times a day (BID) | CUTANEOUS | Status: DC
  Administered 2024-10-28 – 2024-10-29 (×3): 1 via TOPICAL
  Administered 2024-10-31 – 2024-11-02 (×5): 31.1111 via TOPICAL
  Filled 2024-10-28: qty 28.4
  Filled 2024-10-28 (×2): qty 0.9
  Filled 2024-10-28: qty 2.7

## 2024-10-28 MED ORDER — NYSTATIN 100000 UNIT/GM EX POWD
Freq: Three times a day (TID) | CUTANEOUS | Status: DC
  Filled 2024-10-28: qty 15

## 2024-10-28 MED ORDER — SODIUM CHLORIDE 0.9 % IV SOLN: INTRAVENOUS | Status: DC

## 2024-10-28 MED ORDER — METHYLPREDNISOLONE SODIUM SUCC 40 MG IJ SOLR
40.0000 mg | Freq: Two times a day (BID) | INTRAMUSCULAR | Status: DC
  Administered 2024-10-28 – 2024-10-29 (×2): 40 mg via INTRAVENOUS
  Filled 2024-10-28 (×2): qty 1

## 2024-10-28 MED ORDER — HYDROXYZINE HCL 25 MG PO TABS
25.0000 mg | ORAL_TABLET | Freq: Three times a day (TID) | ORAL | Status: DC | PRN
Start: 2024-10-28 — End: 2024-11-02

## 2024-10-28 MED ORDER — SODIUM CHLORIDE 0.9% FLUSH: 3.0000 mL | INTRAVENOUS | Status: DC | PRN

## 2024-10-28 MED ORDER — GABAPENTIN 100 MG PO CAPS: 100.0000 mg | ORAL_CAPSULE | ORAL | Status: DC

## 2024-10-28 MED ORDER — UMECLIDINIUM BROMIDE 62.5 MCG/ACT IN AEPB
1.0000 | INHALATION_SPRAY | Freq: Every day | RESPIRATORY_TRACT | Status: AC
Start: 2024-10-28 — End: ?
  Administered 2024-10-29 – 2024-11-02 (×5): 1 via RESPIRATORY_TRACT
  Filled 2024-10-28: qty 7

## 2024-10-28 MED ORDER — GABAPENTIN 100 MG PO CAPS
200.0000 mg | ORAL_CAPSULE | Freq: Every day | ORAL | Status: DC
  Administered 2024-10-28 – 2024-11-01 (×5): 200 mg via ORAL
  Filled 2024-10-28 (×6): qty 2

## 2024-10-28 MED ORDER — IPRATROPIUM-ALBUTEROL 0.5-2.5 (3) MG/3ML IN SOLN
3.0000 mL | Freq: Four times a day (QID) | RESPIRATORY_TRACT | Status: DC | PRN
  Administered 2024-11-02: 3 mL via RESPIRATORY_TRACT
  Filled 2024-10-28: qty 3

## 2024-10-28 MED ORDER — POLYETHYLENE GLYCOL 3350 17 G PO PACK: 17.0000 g | PACK | Freq: Every day | ORAL | Status: DC | PRN

## 2024-10-28 MED ORDER — SODIUM CHLORIDE 0.9 % IV BOLUS
500.0000 mL | Freq: Once | INTRAVENOUS | Status: AC
  Administered 2024-10-28: 500 mL via INTRAVENOUS

## 2024-10-28 MED ORDER — NITROGLYCERIN 0.4 MG SL SUBL: 0.4000 mg | SUBLINGUAL_TABLET | SUBLINGUAL | Status: DC | PRN

## 2024-10-28 MED ORDER — PANTOPRAZOLE SODIUM 40 MG PO TBEC
40.0000 mg | DELAYED_RELEASE_TABLET | Freq: Every day | ORAL | Status: DC
  Administered 2024-10-29 – 2024-11-02 (×5): 40 mg via ORAL
  Filled 2024-10-28 (×6): qty 1

## 2024-10-28 NOTE — ED Notes (Signed)
 Got patient into a gown on the monitor did EKG shown to Dr Dean patient is resting with call bell in reach
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
## 2024-10-28 NOTE — ED Notes (Signed)
 Report given to receiving RN, PT was taken to yellow zone and 3 pills were found in the PT's bed. 1 of the 2 potassium pills that was given, her protonix  pill and a pink pill was unknown. I acknowledged to the receiving RN in front of the PT that the potassium and yellow protonix  pill were mine and it was ok for her to take those at this time.
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
## 2024-10-28 NOTE — TOC CM/SW Note (Signed)
 TOC consult received for possible SNF placement vs HH services. Therapy evals needed for placement.   Merilee Batty, MSN, RN Case Management [phone]

## 2024-10-29 DIAGNOSIS — I509 Heart failure, unspecified: Secondary | ICD-10-CM | POA: Diagnosis not present

## 2024-10-29 DIAGNOSIS — Q249 Congenital malformation of heart, unspecified: Secondary | ICD-10-CM | POA: Diagnosis not present

## 2024-10-29 LAB — COMPREHENSIVE METABOLIC PANEL WITH GFR
ALT: 33 U/L (ref 0–44)
AST: 33 U/L (ref 15–41)
Albumin: 3.1 g/dL — ABNORMAL LOW (ref 3.5–5.0)
Alkaline Phosphatase: 92 U/L (ref 38–126)
Anion gap: 25 — ABNORMAL HIGH (ref 5–15)
BUN: 71 mg/dL — ABNORMAL HIGH (ref 8–23)
CO2: 21 mmol/L — ABNORMAL LOW (ref 22–32)
Calcium: 6.6 mg/dL — ABNORMAL LOW (ref 8.9–10.3)
Chloride: 85 mmol/L — ABNORMAL LOW (ref 98–111)
Creatinine, Ser: 3.47 mg/dL — ABNORMAL HIGH (ref 0.44–1.00)
GFR, Estimated: 12 mL/min — ABNORMAL LOW (ref >60)
Glucose, Bld: 266 mg/dL — ABNORMAL HIGH (ref 70–99)
Potassium: 3.6 mmol/L (ref 3.5–5.1)
Sodium: 131 mmol/L — ABNORMAL LOW (ref 135–145)
Total Bilirubin: 1.2 mg/dL (ref 0.0–1.2)
Total Protein: 6 g/dL — ABNORMAL LOW (ref 6.5–8.1)

## 2024-10-29 LAB — MAGNESIUM: Magnesium: 1.9 mg/dL (ref 1.7–2.4)

## 2024-10-29 LAB — CBC
HCT: 33.8 % — ABNORMAL LOW (ref 36.0–46.0)
Hemoglobin: 10.6 g/dL — ABNORMAL LOW (ref 12.0–15.0)
MCH: 25.9 pg — ABNORMAL LOW (ref 26.0–34.0)
MCHC: 31.4 g/dL (ref 30.0–36.0)
MCV: 82.4 fL (ref 80.0–100.0)
Platelets: 296 K/uL (ref 150–400)
RBC: 4.1 MIL/uL (ref 3.87–5.11)
RDW: 19.1 % — ABNORMAL HIGH (ref 11.5–15.5)
WBC: 13.2 K/uL — ABNORMAL HIGH (ref 4.0–10.5)
nRBC: 0.2 % (ref 0.0–0.2)

## 2024-10-29 LAB — CBG MONITORING, ED
Glucose-Capillary: 323 mg/dL — ABNORMAL HIGH (ref 70–99)
Glucose-Capillary: 279 mg/dL — ABNORMAL HIGH (ref 70–99)

## 2024-10-29 LAB — GLUCOSE, CAPILLARY
Glucose-Capillary: 255 mg/dL — ABNORMAL HIGH (ref 70–99)
Glucose-Capillary: 161 mg/dL — ABNORMAL HIGH (ref 70–99)
Glucose-Capillary: 179 mg/dL — ABNORMAL HIGH (ref 70–99)

## 2024-10-29 LAB — BRAIN NATRIURETIC PEPTIDE: B Natriuretic Peptide: 265.1 pg/mL — ABNORMAL HIGH (ref 0.0–100.0)

## 2024-10-29 MED ORDER — PREDNISONE 20 MG PO TABS
60.0000 mg | ORAL_TABLET | Freq: Every day | ORAL | Status: DC
  Administered 2024-10-29 – 2024-10-30 (×2): 60 mg via ORAL
  Filled 2024-10-29 (×2): qty 3

## 2024-10-29 MED ORDER — INSULIN GLARGINE-YFGN 100 UNIT/ML ~~LOC~~ SOLN
6.0000 [IU] | Freq: Every day | SUBCUTANEOUS | Status: DC
  Administered 2024-10-29: 6 [IU] via SUBCUTANEOUS
  Filled 2024-10-29 (×2): qty 0.06

## 2024-10-29 MED ORDER — SODIUM CHLORIDE 0.9 % IV SOLN
INTRAVENOUS | Status: AC
  Administered 2024-10-29: 50 mL/h via INTRAVENOUS

## 2024-10-29 MED ORDER — GLIMEPIRIDE 1 MG PO TABS
1.0000 mg | ORAL_TABLET | Freq: Every day | ORAL | Status: DC
  Administered 2024-10-29: 1 mg via ORAL
  Filled 2024-10-29 (×2): qty 1

## 2024-10-29 MED ORDER — CHLORHEXIDINE GLUCONATE CLOTH 2 % EX PADS
6.0000 | MEDICATED_PAD | Freq: Every day | CUTANEOUS | Status: DC
  Administered 2024-10-30 – 2024-11-02 (×4): 6 via TOPICAL

## 2024-10-29 MED ORDER — INSULIN ASPART 100 UNIT/ML IJ SOLN
0.0000 [IU] | Freq: Three times a day (TID) | INTRAMUSCULAR | Status: DC
  Administered 2024-10-29: 5 [IU] via SUBCUTANEOUS
  Administered 2024-10-29 – 2024-10-30 (×2): 2 [IU] via SUBCUTANEOUS
  Administered 2024-10-30 – 2024-10-31 (×2): 3 [IU] via SUBCUTANEOUS
  Administered 2024-10-31 (×2): 2 [IU] via SUBCUTANEOUS
  Filled 2024-10-29: qty 3
  Filled 2024-10-29: qty 4
  Filled 2024-10-29: qty 3
  Filled 2024-10-29: qty 2
  Filled 2024-10-29: qty 5
  Filled 2024-10-29: qty 2
  Filled 2024-10-29: qty 3

## 2024-10-29 MED ORDER — DOXYCYCLINE HYCLATE 100 MG PO TABS
100.0000 mg | ORAL_TABLET | Freq: Two times a day (BID) | ORAL | Status: AC
  Administered 2024-10-29 – 2024-10-31 (×6): 100 mg via ORAL
  Filled 2024-10-29 (×6): qty 1
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
## 2024-10-29 NOTE — Evaluation (Signed)
 Occupational Therapy Evaluation Patient Details Name: Jenny [name] MRN: [id number] DOB: 1933/09/21 Today's Date: 10/29/2024   History of Present Illness   89 yo admitted s/p fall with multiple skin tears and acute COPD exacerbation. Recent admission 10/06/24 for COPD exacerbation; hypokalemia. PMH: HFpEF, CKD, HTN, T2DM, COPD, Afib on Eliquis , CAD, HLD, glaucoma, obese.     Clinical Impressions PTA pt lives alone and has intermittent assistance from family for IADL tasks and is active with a HH agency. Pt uses a rollator for mobility and states she has difficulty making it form her bedroom to the bathroom or kitchen due to her DOE. Pt states since recent hospitalizations, her ability to take care of herself has continued to decline such that she is unable to take care of herself safely and endorses multiple falls. Pt overall min A with limited mobility from bed to chair and mod A with ADL tasks @ RW level due to deficits listed below. Pt would like to participate with rehab at SNF with goal of being placed in an ALF or long term care. O2 probe switched to ear and Spo2 @ 97 on 2L with 3/4 DOE with minimal activity. Acute OT to follow.      If plan is discharge home, recommend the following:   A little help with walking and/or transfers;A lot of help with bathing/dressing/bathroom;Assistance with cooking/housework;Assist for transportation;Help with stairs or ramp for entrance     Functional Status Assessment   Patient has had a recent decline in their functional status and demonstrates the ability to make significant improvements in function in a reasonable and predictable amount of time.     Equipment Recommendations   None recommended by OT     Recommendations for Other Services    Palliative Consult     Precautions/Restrictions   Precautions Precautions: Fall Recall of Precautions/Restrictions: Intact Precaution/Restrictions Comments: multiple skin tears      Mobility Bed Mobility Overal bed mobility: Needs Assistance Bed Mobility: Supine to Sit     Supine to sit: Mod assist          Transfers Overall transfer level: Needs assistance Equipment used: Rolling walker (2 wheels) Transfers: Sit to/from Stand, Bed to chair/wheelchair/BSC Sit to Stand: Min assist     Step pivot transfers: Min assist            Balance Overall balance assessment: History of Falls, Needs assistance Sitting-balance support: Feet supported Sitting balance-Leahy Scale: Fair       Standing balance-Leahy Scale: Poor                             ADL either performed or assessed with clinical judgement   ADL Overall ADL's : Needs assistance/impaired Eating/Feeding: Independent   Grooming: Set up;Sitting   Upper Body Bathing: Set up;Sitting   Lower Body Bathing: Moderate assistance;Sit to/from stand   Upper Body Dressing : Set up;Sitting   Lower Body Dressing: Moderate assistance;Sit to/from stand   Toilet Transfer: Minimal assistance;Stand-pivot;Rolling walker (2 wheels)   Toileting- Clothing Manipulation and Hygiene: Maximal assistance (foley)       Functional mobility during ADLs: Minimal assistance;Rolling walker (2 wheels)       Vision Baseline Vision/History: 1 Wears glasses;3 Glaucoma;2 Legally blind (R eye blind)       Perception         Praxis         Pertinent Vitals/Pain Pain Assessment Pain Assessment: No/denies pain  Extremity/Trunk Assessment Upper Extremity Assessment Upper Extremity Assessment: Generalized weakness (multiple skin tears)   Lower Extremity Assessment Lower Extremity Assessment: Defer to PT evaluation   Cervical / Trunk Assessment Cervical / Trunk Assessment: Kyphotic   Communication     Cognition Arousal: Alert Behavior During Therapy: WFL for tasks assessed/performed Cognition: No apparent impairments                               Following commands:  Intact       Cueing  General Comments          Exercises Exercises: Other exercises Other Exercises Other Exercises: encouraged PLB   Shoulder Instructions      Home Living Family/patient expects to be discharged to:: Skilled nursing facility                                        Prior Functioning/Environment Prior Level of Function : Independent/Modified Independent;History of Falls (last six months)             Mobility Comments: reports 3 falls in 3 weeks ADLs Comments: has assistance form family; [facility]    OT Problem List: Decreased strength;Decreased activity tolerance;Impaired balance (sitting and/or standing);Cardiopulmonary status limiting activity;Obesity   OT Treatment/Interventions: Self-care/ADL training;Therapeutic exercise;Energy conservation;DME and/or AE instruction;Therapeutic activities;Patient/family education;Balance training      OT Goals(Current goals can be found in the care plan section)   Acute Rehab OT Goals Patient Stated Goal: to go to rehab then ALF or LTC OT Goal Formulation: With patient Time For Goal Achievement: 11/12/24 Potential to Achieve Goals: Good   OT Frequency:  Min 2X/week    Co-evaluation              AM-PAC OT 6 Clicks Daily Activity     Outcome Measure Help from another person eating meals?: None Help from another person taking care of personal grooming?: A Little Help from another person toileting, which includes using toliet, bedpan, or urinal?: A Lot Help from another person bathing (including washing, rinsing, drying)?: A Lot Help from another person to put on and taking off regular upper body clothing?: A Little Help from another person to put on and taking off regular lower body clothing?: A Lot 6 Click Score: 16   End of Session Equipment Utilized During Treatment: Gait belt;Rolling walker (2 wheels) Nurse Communication: Mobility status  Activity Tolerance: Patient tolerated  treatment well Patient left: in chair;with call bell/phone within reach;with chair alarm set  OT Visit Diagnosis: Unsteadiness on feet (R26.81);Other abnormalities of gait and mobility (R26.89);Muscle weakness (generalized) (M62.81);History of falling (Z91.81)                Time: 8344-8274 OT Time Calculation (min): 30 min Charges:  OT General Charges $OT Visit: 1 Visit OT Evaluation $OT Eval Moderate Complexity: 1 Mod OT Treatments $Self Care/Home Management : 8-22 mins  Kreg Sink, OT/L   Acute OT Clinical Specialist Acute Rehabilitation Services Pager [phone] Office [phone]   [facility] 10/29/2024, 6:01 PM

## 2024-10-29 NOTE — Progress Notes (Signed)
 Pt admitted from ED/r/t fall at home. Pt on tele, 2L oxygen. Pt has multiple scratches and bruises over entire body. BP (!) 151/69 (BP Location: Left Arm)   Pulse 62   Temp 97.6 F (36.4 C) (Oral)   Resp 17   Ht 5' 2 (1.575 m)   Wt 85.2 kg   SpO2 99%   BMI 34.35 kg/m  Pt oriented to floor. No other needs voiced at this time. Jenny [name] 10/29/24 3:54 PM

## 2024-10-29 NOTE — Inpatient Diabetes Management (Signed)
 Inpatient Diabetes Program Recommendations  AACE/ADA: New Consensus Statement on Inpatient Glycemic Control   Target Ranges:  Prepandial:   less than 140 mg/dL      Peak postprandial:   less than 180 mg/dL (1-2 hours)      Critically ill patients:  140 - 180 mg/dL   Lab Results  Component Value Date   GLUCAP 255 (H) 10/29/2024   HGBA1C 6.6 (H) 06/18/2024    Latest Reference Range & Units 10/29/24 08:43 10/29/24 12:50 10/29/24 13:40  Glucose-Capillary 70 - 99 mg/dL 676 (H) 720 (H) 744 (H)   Review of Glycemic Control  Diabetes history: DM2  Outpatient Diabetes medications:  None  Current orders for Inpatient glycemic control:  Novolog  0-9 units TID+ Amaryl  1mg  daily   Prednisone  60 mg daily  Inpatient Diabetes Program Recommendations:     While on steroids, consider adding Semglee  8 units daily  Thanks,  Lavanda Search, RN, MSN, [facility]  Inpatient Diabetes Coordinator  Pager [phone] (8a-5p)

## 2024-10-29 NOTE — ED Notes (Signed)
 Notified floor patient coming up

## 2024-10-29 NOTE — Progress Notes (Signed)
 Heart Failure Navigator Progress Note  Assessed for Heart & Vascular TOC clinic readiness.  Patient does not meet criteria due to EF 60-65% , Has a scheduled AHF appiontment at [facility] on 12/22/2023.  Navigator will sign off at this time.   Stephane Haddock, BSN, Scientist, Clinical (histocompatibility And Immunogenetics) Only

## 2024-10-30 DIAGNOSIS — Q249 Congenital malformation of heart, unspecified: Secondary | ICD-10-CM | POA: Diagnosis not present

## 2024-10-30 DIAGNOSIS — I509 Heart failure, unspecified: Secondary | ICD-10-CM | POA: Diagnosis not present

## 2024-10-30 LAB — GLUCOSE, CAPILLARY
Glucose-Capillary: 89 mg/dL (ref 70–99)
Glucose-Capillary: 163 mg/dL — ABNORMAL HIGH (ref 70–99)
Glucose-Capillary: 242 mg/dL — ABNORMAL HIGH (ref 70–99)
Glucose-Capillary: 195 mg/dL — ABNORMAL HIGH (ref 70–99)

## 2024-10-30 LAB — BASIC METABOLIC PANEL WITH GFR
Anion gap: 18 — ABNORMAL HIGH (ref 5–15)
BUN: 82 mg/dL — ABNORMAL HIGH (ref 8–23)
CO2: 24 mmol/L (ref 22–32)
Calcium: 7 mg/dL — ABNORMAL LOW (ref 8.9–10.3)
Chloride: 90 mmol/L — ABNORMAL LOW (ref 98–111)
Creatinine, Ser: 3.65 mg/dL — ABNORMAL HIGH (ref 0.44–1.00)
GFR, Estimated: 11 mL/min — ABNORMAL LOW (ref >60)
Glucose, Bld: 97 mg/dL (ref 70–99)
Potassium: 4.5 mmol/L (ref 3.5–5.1)
Sodium: 132 mmol/L — ABNORMAL LOW (ref 135–145)

## 2024-10-30 LAB — CBC WITH DIFFERENTIAL/PLATELET
Abs Immature Granulocytes: 0.18 K/uL — ABNORMAL HIGH (ref 0.00–0.07)
Basophils Absolute: 0 K/uL (ref 0.0–0.1)
Basophils Relative: 0 %
Eosinophils Absolute: 0 K/uL (ref 0.0–0.5)
Eosinophils Relative: 0 %
HCT: 30.2 % — ABNORMAL LOW (ref 36.0–46.0)
Hemoglobin: 9.5 g/dL — ABNORMAL LOW (ref 12.0–15.0)
Immature Granulocytes: 1 %
Lymphocytes Relative: 3 %
Lymphs Abs: 0.4 K/uL — ABNORMAL LOW (ref 0.7–4.0)
MCH: 25.8 pg — ABNORMAL LOW (ref 26.0–34.0)
MCHC: 31.5 g/dL (ref 30.0–36.0)
MCV: 82.1 fL (ref 80.0–100.0)
Monocytes Absolute: 0.7 K/uL (ref 0.1–1.0)
Monocytes Relative: 4 %
Neutro Abs: 14.9 K/uL — ABNORMAL HIGH (ref 1.7–7.7)
Neutrophils Relative %: 92 %
Platelets: 294 K/uL (ref 150–400)
RBC: 3.68 MIL/uL — ABNORMAL LOW (ref 3.87–5.11)
RDW: 18.6 % — ABNORMAL HIGH (ref 11.5–15.5)
WBC: 16.2 K/uL — ABNORMAL HIGH (ref 4.0–10.5)
nRBC: 0.2 % (ref 0.0–0.2)

## 2024-10-30 MED ORDER — GLIMEPIRIDE 1 MG PO TABS
0.5000 mg | ORAL_TABLET | Freq: Every day | ORAL | Status: DC
  Administered 2024-10-31: 0.5 mg via ORAL
  Filled 2024-10-30 (×2): qty 0.5

## 2024-10-30 MED ORDER — SODIUM CHLORIDE 0.9 % IV SOLN: INTRAVENOUS | Status: AC

## 2024-10-30 MED ORDER — PREDNISONE 20 MG PO TABS
20.0000 mg | ORAL_TABLET | Freq: Every day | ORAL | Status: AC
  Administered 2024-10-31 – 2024-11-02 (×3): 20 mg via ORAL
  Filled 2024-10-30 (×3): qty 1

## 2024-10-30 NOTE — Progress Notes (Signed)
   Met with the pt herself at bedside to discuss palliative care services at Facility and then at home as well if she is able to transition home. She is in agreement with this services and we will follow for coordination of care at d/c.   Magdalena Berber RN [phone]
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
## 2024-10-30 NOTE — TOC Progression Note (Signed)
 Transition of Care [facility]) - Progression Note    Patient Details  Name: Jenny [name] MRN: [id number] Date of Birth: [birth year]  Transition of Care [facility]) CM/SW Contact  [name], [place] Phone Number: 10/30/2024, 11:17 AM  Clinical Narrative:   CSW discussed PT rec for SNF with patient. Patient agreeable at this time. Patient expressed she does not want to go to [facility] and rehab.   CSW will continue to follow.    Expected Discharge Plan: Skilled Nursing Facility Barriers to Discharge: Continued Medical Work up, SNF Pending bed offer               Expected Discharge Plan and Services In-house Referral: Clinical Social Work     Living arrangements for the past 2 months: Single Family Home Expected Discharge Date: 11/02/24                                     Social Drivers of Health (SDOH) Interventions SDOH Screenings   Food Insecurity: Patient Declined (10/29/2024)  Housing: Patient Declined (10/29/2024)  Transportation Needs: Patient Declined (10/29/2024)  Utilities: Patient Declined (10/29/2024)  Alcohol Screen: Low Risk  (07/02/2024)  Depression (PHQ2-9): Low Risk  (07/22/2022)  Financial Resource Strain: Low Risk  (07/02/2024)  Social Connections: Patient Declined (10/29/2024)  Recent Concern: Social Connections - Socially Isolated (10/07/2024)  Tobacco Use: Medium Risk (10/28/2024)    Readmission Risk Interventions    07/05/2024   10:24 AM  Readmission Risk Prevention Plan  Transportation Screening Complete  HRI or Home Care Consult Complete  Social Work Consult for Recovery Care Planning/Counseling Complete  Palliative Care Screening Not Applicable  Medication Review Oceanographer) Complete

## 2024-10-30 NOTE — NC FL2 (Signed)
 [place]  MEDICAID FL2 LEVEL OF CARE FORM     IDENTIFICATION  Patient Name: Jenny [name] Birthdate: [birth year] Sex: female Admission Date (Current Location): 10/28/2024  [facility] and [place] Number:  Producer, Television/film/video and Address:  [facility], 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) 10/28/2024   Respiratory distress 09/04/2024   CKD (chronic kidney disease), stage IV (HCC) 09/03/2024   GERD (gastroesophageal reflux disease) 09/03/2024   History of atrial fibrillation 09/03/2024   Acute respiratory failure (HCC) 09/02/2024   Hyponatremia 09/02/2024   Hypokalemia 09/02/2024   AKI (acute kidney injury) 07/02/2024   Pulmonary hypertension, unspecified (HCC) 07/02/2024   Shortness of breath 07/02/2024   Acute dyspnea 07/01/2024   Right hip pain 06/21/2024   SOB (shortness of breath) 06/17/2024   COPD exacerbation (HCC) 06/16/2024   Other secondary pulmonary hypertension (HCC) 10/14/2023   Junctional rhythm 09/27/2023   Chronic heart failure with preserved ejection fraction (HFpEF) (HCC) 09/26/2023   Symptomatic bradycardia 09/26/2023   Pain due to onychomycosis of toenails of both feet 05/27/2023   Type II diabetes mellitus with peripheral circulatory disorder (HCC) 05/27/2023   Tachycardia-bradycardia syndrome (HCC) 03/28/2022   Stage 3b chronic kidney disease (HCC) 02/21/2022   Type 2 diabetes mellitus with diabetic chronic kidney disease (HCC) 02/21/2022    Normocytic anemia 02/21/2022   Chronic obstructive pulmonary disease, unspecified (HCC) 02/21/2022   Edema, unspecified 02/21/2022   Hypertension 02/21/2022   Congestive heart failure (HCC) 02/21/2022   Other and unspecified hyperlipidemia 02/21/2022   Old myocardial infarction 02/21/2022   PAF (paroxysmal atrial fibrillation) (HCC) 02/21/2022   Heart failure (HCC) 11/04/2021   Acute on chronic diastolic CHF (congestive heart failure) (HCC) 11/03/2021   Secondary hypercoagulable state 10/23/2021   Persistent atrial fibrillation (HCC) 10/11/2021   Cellulitis 08/22/2021   Obese    Dyspepsia 04/05/2011   Allergic rhinitis 10/16/2010   COPD (chronic obstructive pulmonary disease) (HCC) 05/21/2010   Essential hypertension 04/06/2010   EDEMA 03/09/2010   ANEMIA-NOS 10/11/2009   Unspecified glaucoma 10/11/2009   CAD (coronary artery disease) 10/03/2009   HLD (hyperlipidemia) 07/12/2009   TOBACCO ABUSE 07/12/2009   Acute myocardial infarction (HCC) 07/12/2009   Diabetes mellitus 07/12/2009    Orientation RESPIRATION BLADDER Height & Weight     Self, Time, Situation, Place  O2 (2L O2 Alpha (may ween)) Continent, Indwelling catheter Weight: 189 lb (85.7 kg) Height:  5' 2 (157.5 cm)  BEHAVIORAL SYMPTOMS/MOOD NEUROLOGICAL BOWEL NUTRITION STATUS      Continent Diet (Please see discharge summary)  AMBULATORY STATUS COMMUNICATION OF NEEDS Skin   Extensive Assist Verbally Normal                       Personal Care Assistance Level of Assistance  Bathing, Feeding, Dressing Bathing Assistance: Limited assistance Feeding assistance: Independent Dressing Assistance: Limited assistance     Functional Limitations Info  Sight, Hearing, Speech Sight Info: Impaired (R and L (Eyeglasses)) Hearing Info: Impaired (  R and L) Speech Info: Adequate    SPECIAL CARE FACTORS FREQUENCY  PT (By licensed PT), OT (By licensed OT)     PT Frequency: 5x week OT Frequency: 5x week             Contractures Contractures Info: Not present    Additional Factors Info  Code Status, Allergies, Insulin  Sliding Scale, Psychotropic Code Status Info: DNR limited Allergies Info: Aspirin , Entresto  (Sacubitril -valsartan ), Atorvastatin , Cyclobenzaprine, Lactose Intolerance (Gi), Meperidine Hcl, Pravastatin , Propoxyphene, Wound Dressing Adhesive Psychotropic Info: gabapentin  Insulin  Sliding Scale Info: Please see discharge summary       Current Medications (10/30/2024):  This is the current hospital active medication list Current Facility-Administered Medications  Medication Dose Route Frequency Provider Last Rate Last Admin   0.9 %  sodium chloride  infusion   Intravenous Continuous Samtani, Jai-Gurmukh, MD 50 mL/hr at 10/30/24 1052 New Bag at 10/30/24 1052   acetaminophen  (TYLENOL ) tablet 1,000 mg  1,000 mg Oral Q6H PRN Samtani, Jai-Gurmukh, MD   1,000 mg at 10/29/24 2352   apixaban  (ELIQUIS ) tablet 2.5 mg  2.5 mg Oral BID Samtani, Jai-Gurmukh, MD   2.5 mg at 10/30/24 0935   arformoterol  (BROVANA ) nebulizer solution 15 mcg  15 mcg Nebulization BID Samtani, Jai-Gurmukh, MD   15 mcg at 10/30/24 9167   And   umeclidinium bromide  (INCRUSE ELLIPTA ) 62.5 MCG/ACT 1 puff  1 puff Inhalation Daily Samtani, Jai-Gurmukh, MD   1 puff at 10/30/24 9166   bacitracin  ointment   Topical BID Samtani, Jai-Gurmukh, MD   Given at 10/30/24 9060   Chlorhexidine  Gluconate Cloth 2 % PADS 6 each  6 each Topical Daily Samtani, Jai-Gurmukh, MD   6 each at 10/30/24 9061   dorzolamide  (TRUSOPT ) 2 % ophthalmic solution 1 drop  1 drop Both Eyes BID Samtani, Jai-Gurmukh, MD   1 drop at 10/30/24 0940   doxycycline  (VIBRA -TABS) tablet 100 mg  100 mg Oral Q12H Samtani, Jai-Gurmukh, MD   100 mg at 10/30/24 0935   gabapentin  (NEURONTIN ) capsule 100 mg  100 mg Oral q AM Samtani, Jai-Gurmukh, MD   100 mg at 10/30/24 9450   And   gabapentin  (NEURONTIN ) capsule 200 mg  200 mg Oral QHS Samtani, Jai-Gurmukh, MD   200 mg at 10/29/24  2135   [START ON 10/31/2024] glimepiride  (AMARYL ) tablet 0.5 mg  0.5 mg Oral Q breakfast Samtani, Jai-Gurmukh, MD       hydrOXYzine  (ATARAX ) tablet 25 mg  25 mg Oral Q8H PRN Samtani, Jai-Gurmukh, MD       insulin  aspart (novoLOG ) injection 0-9 Units  0-9 Units Subcutaneous TID WC Samtani, Jai-Gurmukh, MD   2 Units at 10/29/24 1801   ipratropium-albuterol  (DUONEB) 0.5-2.5 (3) MG/3ML nebulizer solution 3 mL  3 mL Nebulization Q6H PRN Samtani, Jai-Gurmukh, MD       latanoprost  (XALATAN ) 0.005 % ophthalmic solution 1 drop  1 drop Both Eyes QHS Samtani, Jai-Gurmukh, MD   1 drop at 10/29/24 2128   nitroGLYCERIN  (NITROSTAT ) SL tablet 0.4 mg  0.4 mg Sublingual Q5 min PRN Samtani, Jai-Gurmukh, MD       nystatin  (MYCOSTATIN /NYSTOP ) topical powder   Topical TID Samtani, Jai-Gurmukh, MD   Given at 10/30/24 0941   pantoprazole  (PROTONIX ) EC tablet 40 mg  40 mg Oral Daily Samtani, Jai-Gurmukh, MD   40 mg at 10/30/24 0935   polyethylene glycol (MIRALAX  / GLYCOLAX ) packet 17 g  17 g Oral Daily PRN Samtani, Jai-Gurmukh, MD       potassium chloride  SA (KLOR-CON  M)  CR tablet 40 mEq  40 mEq Oral Daily Samtani, Jai-Gurmukh, MD   40 mEq at 10/30/24 0935   predniSONE  (DELTASONE ) tablet 60 mg  60 mg Oral QAC breakfast Samtani, Jai-Gurmukh, MD   60 mg at 10/30/24 0935   sodium chloride  flush (NS) 0.9 % injection 3 mL  3 mL Intravenous Q12H Samtani, Jai-Gurmukh, MD   3 mL at 10/30/24 9061   sodium chloride  flush (NS) 0.9 % injection 3 mL  3 mL Intravenous PRN Samtani, Jai-Gurmukh, MD         Discharge Medications: Please see discharge summary for a list of discharge medications.  Relevant Imaging Results:  Relevant Lab Results:   Additional Information SSN [id number]  Isahia Hollerbach C Kroy Sprung, LCSWA

## 2024-10-30 NOTE — Evaluation (Signed)
 Physical Therapy Evaluation Patient Details Name: Jenny [name] MRN: [id number] DOB: 10-13-1933 Today's Date: 10/30/2024  History of Present Illness  89 yo admitted s/p fall with multiple skin tears and acute COPD exacerbation. Recent admission 10/06/24 for COPD exacerbation; hypokalemia. PMH: HFpEF, CKD, HTN, T2DM, COPD, Afib on Eliquis , CAD, HLD, glaucoma, obese.  Clinical Impression  Pt in bed upon arrival of PT, agreeable to evaluation at this time. Prior to admission the pt reports she was ambulating in her home only necessary distances (to bathroom or food) with use of rollator due to fatigue and frequent falls. Pt reports 3 falls in last 3 weeks and only intermittent assist from family (all work full time). The pt presents today with deficits in strength, power, endurance, standing balance, and activity tolerance. Will benefit from skilled PT acutely and post-acute inpatient rehab <3hours/day to facilitate improvements in mobility and reduce risk of continued falls.   VITALS:  - supine in bed - BP: 107/64 (78); HR: 53bpm - sitting EOB - BP: 97/44 (60); HR: 60bpm - standing - BP: 116/61 (77); HR: 55bpm *pt reports dizziness, unable to remain standing* - sitting after transfer - BP: 107/44 (64); HR: 55bpm     If plan is discharge home, recommend the following: A little help with walking and/or transfers;A little help with bathing/dressing/bathroom;Assistance with cooking/housework   Can travel by private vehicle   Yes    Equipment Recommendations None recommended by PT  Recommendations for Other Services       Functional Status Assessment Patient has had a recent decline in their functional status and demonstrates the ability to make significant improvements in function in a reasonable and predictable amount of time.     Precautions / Restrictions Precautions Precautions: Fall Recall of Precautions/Restrictions: Intact Precaution/Restrictions Comments: multiple skin  tears Restrictions Weight Bearing Restrictions Per Provider Order: No      Mobility  Bed Mobility Overal bed mobility: Needs Assistance Bed Mobility: Supine to Sit     Supine to sit: Min assist, HOB elevated, Used rails     General bed mobility comments: minA to complete movement of LE    Transfers Overall transfer level: Needs assistance Equipment used: Rollator (4 wheels) Transfers: Sit to/from Stand, Bed to chair/wheelchair/BSC Sit to Stand: Min assist   Step pivot transfers: Min assist       General transfer comment: minA to rise and steady, reports dizziness with continued standing for BP, limited to small lateral steps to chair. BP soft throughout    Ambulation/Gait Ambulation/Gait assistance: Min assist Gait Distance (Feet): 5 Feet Assistive device: Rollator (4 wheels) Gait Pattern/deviations: Step-to pattern, Decreased stride length, Shuffle, Trunk flexed Gait velocity: decreased     General Gait Details: small steps to chair with minA to steady and maintain standing for BP. no overt buckling but unable to continue due to fatigue and dizziness     Balance Overall balance assessment: History of Falls, Needs assistance Sitting-balance support: Feet supported Sitting balance-Leahy Scale: Fair Sitting balance - Comments: prefers UE support, can tolerate without   Standing balance support: Bilateral upper extremity supported, During functional activity Standing balance-Leahy Scale: Poor Standing balance comment: dependent on BUE support and minA                             Pertinent Vitals/Pain Pain Assessment Pain Assessment: No/denies pain    Home Living Family/patient expects to be discharged to:: Skilled nursing facility Living Arrangements: Alone  Available Help at Discharge: Family;Neighbor;Available PRN/intermittently Type of Home: House Home Access: Ramped entrance       Home Layout: One level Home Equipment: Rollator (4  wheels);BSC/3in1;Tub bench;Grab bars - tub/shower Additional Comments: pt from home alone, has family but all work full time. very little assistance available    Prior Function Prior Level of Function : Independent/Modified Independent;History of Falls (last six months)             Mobility Comments: reports 3 falls in 3 weeks, mostly sedentary outside of necesary trips to bathroom ADLs Comments: assist from family for IADLs, pt reports only 1 meal per day usually     Extremity/Trunk Assessment   Upper Extremity Assessment Upper Extremity Assessment: Defer to OT evaluation;Generalized weakness    Lower Extremity Assessment Lower Extremity Assessment: Generalized weakness (grossly 4-/5 to MMT. reports chronic neuropathy in bilateral feet.)    Cervical / Trunk Assessment Cervical / Trunk Assessment: Kyphotic;Other exceptions Cervical / Trunk Exceptions: increased body habitus  Communication   Communication Communication: Impaired Factors Affecting Communication: Hearing impaired    Cognition Arousal: Alert Behavior During Therapy: WFL for tasks assessed/performed   PT - Cognitive impairments: No apparent impairments                         Following commands: Intact       Cueing Cueing Techniques: Verbal cues     General Comments General comments (skin integrity, edema, etc.): SpO2 >90% on RA during session with good pleth, returned to 2L at end of session. BP soft especially with initial transition to sitting (97/44). open skin tears on R elbow and RN aware to provide dressing. RR to 30s    Exercises     Assessment/Plan    PT Assessment Patient needs continued PT services  PT Problem List Decreased strength;Decreased activity tolerance;Decreased balance;Decreased mobility;Decreased knowledge of use of DME;Cardiopulmonary status limiting activity;Obesity       PT Treatment Interventions DME instruction;Gait training;Functional mobility  training;Therapeutic activities;Therapeutic exercise;Balance training;Patient/family education    PT Goals (Current goals can be found in the Care Plan section)  Acute Rehab PT Goals Patient Stated Goal: to get her strength and balance better PT Goal Formulation: With patient Time For Goal Achievement: 11/13/24 Potential to Achieve Goals: Good    Frequency Min 2X/week        AM-PAC PT 6 Clicks Mobility  Outcome Measure Help needed turning from your back to your side while in a flat bed without using bedrails?: A Little Help needed moving from lying on your back to sitting on the side of a flat bed without using bedrails?: A Lot Help needed moving to and from a bed to a chair (including a wheelchair)?: A Little Help needed standing up from a chair using your arms (e.g., wheelchair or bedside chair)?: A Little Help needed to walk in hospital room?: Total (<20 ft) Help needed climbing 3-5 steps with a railing? : Total 6 Click Score: 13    End of Session Equipment Utilized During Treatment: Gait belt;Oxygen Activity Tolerance: Patient limited by fatigue Patient left: with call bell/phone within reach;in chair;with chair alarm set;with family/visitor present;with nursing/sitter in room Nurse Communication: Mobility status PT Visit Diagnosis: Repeated falls (R29.6)    Time: 9095-9067 PT Time Calculation (min) (ACUTE ONLY): 28 min   Charges:   PT Evaluation $PT Eval Low Complexity: 1 Low PT Treatments $Therapeutic Exercise: 8-22 mins PT General Charges $$ ACUTE PT VISIT: 1 Visit  Izetta Call, PT, DPT   Acute Rehabilitation Department Office [phone] Secure Chat Communication Preferred  Izetta JULIANNA Call 10/30/2024, 10:15 AM

## 2024-10-31 DIAGNOSIS — I509 Heart failure, unspecified: Secondary | ICD-10-CM | POA: Diagnosis not present

## 2024-10-31 DIAGNOSIS — Q249 Congenital malformation of heart, unspecified: Secondary | ICD-10-CM | POA: Diagnosis not present

## 2024-10-31 LAB — BASIC METABOLIC PANEL WITH GFR
Anion gap: 16 — ABNORMAL HIGH (ref 5–15)
BUN: 80 mg/dL — ABNORMAL HIGH (ref 8–23)
CO2: 24 mmol/L (ref 22–32)
Calcium: 6.6 mg/dL — ABNORMAL LOW (ref 8.9–10.3)
Chloride: 92 mmol/L — ABNORMAL LOW (ref 98–111)
Creatinine, Ser: 3.16 mg/dL — ABNORMAL HIGH (ref 0.44–1.00)
GFR, Estimated: 14 mL/min — ABNORMAL LOW (ref >60)
Glucose, Bld: 129 mg/dL — ABNORMAL HIGH (ref 70–99)
Potassium: 4 mmol/L (ref 3.5–5.1)
Sodium: 132 mmol/L — ABNORMAL LOW (ref 135–145)

## 2024-10-31 LAB — GLUCOSE, CAPILLARY
Glucose-Capillary: 235 mg/dL — ABNORMAL HIGH (ref 70–99)
Glucose-Capillary: 186 mg/dL — ABNORMAL HIGH (ref 70–99)
Glucose-Capillary: 209 mg/dL — ABNORMAL HIGH (ref 70–99)
Glucose-Capillary: 189 mg/dL — ABNORMAL HIGH (ref 70–99)

## 2024-10-31 LAB — HEMOGLOBIN A1C
Hgb A1c MFr Bld: 8.4 % — ABNORMAL HIGH (ref 4.8–5.6)
Mean Plasma Glucose: 194.38 mg/dL

## 2024-10-31 MED ORDER — SODIUM CHLORIDE 0.9 % IV SOLN: INTRAVENOUS | Status: DC

## 2024-10-31 NOTE — Plan of Care (Signed)
   Problem: Coping: Goal: Ability to adjust to condition or change in health will improve Outcome: Progressing   Problem: Safety: Goal: Ability to remain free from injury will improve Outcome: Progressing   Problem: Skin Integrity: Goal: Risk for impaired skin integrity will decrease Outcome: Progressing
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
## 2024-10-31 NOTE — Progress Notes (Signed)
 Mobility Specialist Progress Note:    10/31/24 1337  Mobility  Activity Dangled on edge of bed (Ankle Pumps, Leg Ext)  Level of Assistance Standby assist, set-up cues, supervision of patient - no hands on  Assistive Device None  Range of Motion/Exercises Left leg;Right leg;Active  Activity Response Tolerated poorly  Mobility Referral Yes  Mobility visit 1 Mobility  Mobility Specialist Start Time (ACUTE ONLY) 1337  Mobility Specialist Stop Time (ACUTE ONLY) 1344  Mobility Specialist Time Calculation (min) (ACUTE ONLY) 7 min   Received pt sitting on EOB not feeling well enough to ambulate but wanted to do some EOB mobility. C/o fatigue, SOB, and leg weakness. Pt able to perform movements slowly but completely. Left pt on EOB w/ all needs met. Notified RN  Venetia Keel Mobility Specialist Please Contact via SecureChat or Rehab Office at [phone]

## 2024-11-01 ENCOUNTER — Telehealth (HOSPITAL_COMMUNITY): Payer: Self-pay

## 2024-11-01 ENCOUNTER — Other Ambulatory Visit (HOSPITAL_COMMUNITY): Payer: Self-pay

## 2024-11-01 ENCOUNTER — Telehealth (HOSPITAL_COMMUNITY): Payer: Self-pay | Admitting: Pharmacy Technician

## 2024-11-01 DIAGNOSIS — I509 Heart failure, unspecified: Secondary | ICD-10-CM | POA: Diagnosis not present

## 2024-11-01 DIAGNOSIS — Q249 Congenital malformation of heart, unspecified: Secondary | ICD-10-CM | POA: Diagnosis not present

## 2024-11-01 LAB — BASIC METABOLIC PANEL WITH GFR
Anion gap: 14 (ref 5–15)
BUN: 64 mg/dL — ABNORMAL HIGH (ref 8–23)
CO2: 22 mmol/L (ref 22–32)
Calcium: 6.7 mg/dL — ABNORMAL LOW (ref 8.9–10.3)
Chloride: 98 mmol/L (ref 98–111)
Creatinine, Ser: 2.19 mg/dL — ABNORMAL HIGH (ref 0.44–1.00)
GFR, Estimated: 21 mL/min — ABNORMAL LOW (ref >60)
Glucose, Bld: 230 mg/dL — ABNORMAL HIGH (ref 70–99)
Potassium: 3.3 mmol/L — ABNORMAL LOW (ref 3.5–5.1)
Sodium: 134 mmol/L — ABNORMAL LOW (ref 135–145)

## 2024-11-01 LAB — GLUCOSE, CAPILLARY
Glucose-Capillary: 40 mg/dL — CL (ref 70–99)
Glucose-Capillary: 44 mg/dL — CL (ref 70–99)
Glucose-Capillary: 17 mg/dL — CL (ref 70–99)
Glucose-Capillary: 28 mg/dL — CL (ref 70–99)
Glucose-Capillary: 197 mg/dL — ABNORMAL HIGH (ref 70–99)
Glucose-Capillary: 199 mg/dL — ABNORMAL HIGH (ref 70–99)
Glucose-Capillary: 181 mg/dL — ABNORMAL HIGH (ref 70–99)
Glucose-Capillary: 243 mg/dL — ABNORMAL HIGH (ref 70–99)

## 2024-11-01 LAB — CBC WITH DIFFERENTIAL/PLATELET
Abs Immature Granulocytes: 0.27 K/uL — ABNORMAL HIGH (ref 0.00–0.07)
Basophils Absolute: 0 K/uL (ref 0.0–0.1)
Basophils Relative: 0 %
Eosinophils Absolute: 0 K/uL (ref 0.0–0.5)
Eosinophils Relative: 0 %
HCT: 30 % — ABNORMAL LOW (ref 36.0–46.0)
Hemoglobin: 9.3 g/dL — ABNORMAL LOW (ref 12.0–15.0)
Immature Granulocytes: 3 %
Lymphocytes Relative: 5 %
Lymphs Abs: 0.6 K/uL — ABNORMAL LOW (ref 0.7–4.0)
MCH: 26.2 pg (ref 26.0–34.0)
MCHC: 31 g/dL (ref 30.0–36.0)
MCV: 84.5 fL (ref 80.0–100.0)
Monocytes Absolute: 0.7 K/uL (ref 0.1–1.0)
Monocytes Relative: 6 %
Neutro Abs: 8.8 K/uL — ABNORMAL HIGH (ref 1.7–7.7)
Neutrophils Relative %: 86 %
Platelets: 256 K/uL (ref 150–400)
RBC: 3.55 MIL/uL — ABNORMAL LOW (ref 3.87–5.11)
RDW: 18.7 % — ABNORMAL HIGH (ref 11.5–15.5)
WBC: 10.3 K/uL (ref 4.0–10.5)
nRBC: 0 % (ref 0.0–0.2)

## 2024-11-01 LAB — RENAL FUNCTION PANEL
Albumin: 2.8 g/dL — ABNORMAL LOW (ref 3.5–5.0)
Anion gap: 12 (ref 5–15)
BUN: 69 mg/dL — ABNORMAL HIGH (ref 8–23)
CO2: 25 mmol/L (ref 22–32)
Calcium: 6.4 mg/dL — CL (ref 8.9–10.3)
Chloride: 98 mmol/L (ref 98–111)
Creatinine, Ser: 2.37 mg/dL — ABNORMAL HIGH (ref 0.44–1.00)
GFR, Estimated: 19 mL/min — ABNORMAL LOW (ref >60)
Glucose, Bld: 82 mg/dL (ref 70–99)
Phosphorus: 3.7 mg/dL (ref 2.5–4.6)
Potassium: 3.8 mmol/L (ref 3.5–5.1)
Sodium: 135 mmol/L (ref 135–145)

## 2024-11-01 LAB — VITAMIN D 25 HYDROXY (VIT D DEFICIENCY, FRACTURES): Vit D, 25-Hydroxy: 10.55 ng/mL — ABNORMAL LOW (ref 30–100)

## 2024-11-01 MED ORDER — DEXTROSE 50 % IV SOLN
INTRAVENOUS | Status: AC
  Administered 2024-11-01: 50 mL
  Filled 2024-11-01: qty 50

## 2024-11-01 MED ORDER — CALCIUM GLUCONATE-NACL 1-0.675 GM/50ML-% IV SOLN
1.0000 g | Freq: Once | INTRAVENOUS | Status: AC
  Administered 2024-11-01: 1000 mg via INTRAVENOUS
  Filled 2024-11-01: qty 50

## 2024-11-01 NOTE — Telephone Encounter (Addendum)
 Patient Product/process Development Scientist completed.    The patient is insured through [employer]. Patient has Medicare and is not eligible for a copay card, but may be able to apply for patient assistance or Medicare RX Payment Plan (Patient Must reach out to their plan, if eligible for payment plan), if available.    Ran test claim for Tradjenta 5 mg and the current 30 day co-pay is $73.86.  Ran test claim for Januvia 50 mg and the current 30 day co-pay is $46.42.   This test claim was processed through [facility]- copay amounts may vary at other pharmacies due to pharmacy/plan contracts, or as the patient moves through the different stages of their insurance plan.     Reyes Sharps, CPHT Pharmacy Technician Patient Advocate Specialist Lead [facility] Patient Advocate Team Direct Number: [phone]  Fax: [phone]

## 2024-11-01 NOTE — Progress Notes (Signed)
 Mobility Specialist Progress Note:    11/01/24 0935  Mobility  Activity Ambulated with assistance  Level of Assistance Minimal assist, patient does 75% or more  Assistive Device Other (Comment) (HHA)  Distance Ambulated (ft) 3 ft  Activity Response Tolerated well  Mobility Referral Yes  Mobility visit 1 Mobility  Mobility Specialist Start Time (ACUTE ONLY) [id number]  Mobility Specialist Stop Time (ACUTE ONLY) 0934  Mobility Specialist Time Calculation (min) (ACUTE ONLY) 11 min   Pt received seated EOB requesting assistance getting supine in bed. Pt agreeable to take a few steps towards HOB. MinA required for STS via HHA. RN requested pt stayed at EOB to take medications. Left EOB w/ call bell and personal belongings in reach. All needs met. Bed alarm on. RN in room.  Thersia Minder Mobility Specialist  Please contact vis Secure Chat or  Rehab Office [phone]

## 2024-11-01 NOTE — Telephone Encounter (Signed)
 Pharmacy Patient Advocate Encounter  Insurance verification completed.    The patient is insured through [employer]. Patient has Medicare and is not eligible for a copay card, but may be able to apply for patient assistance or Medicare RX Payment Plan (Patient Must reach out to their plan, if eligible for payment plan), if available.    Ran test claim for Eliquis  2.5mg  and the current 30 day co-pay is $85.29.   This test claim was processed through [facility]- copay amounts may vary at other pharmacies due to pharmacy/plan contracts, or as the patient moves through the different stages of their insurance plan.

## 2024-11-01 NOTE — Progress Notes (Signed)
   11/01/24 1400  Spiritual Encounters  Type of Visit Initial  Care provided to: Patient  Reason for visit Routine spiritual support  OnCall Visit No  Spiritual Framework  Presenting Themes Meaning/purpose/sources of inspiration;Significant life change  Community/Connection Family  Patient Stress Factors Major life changes  Interventions  Spiritual Care Interventions Made Established relationship of care and support;Compassionate presence;Reflective listening;Normalization of emotions  Intervention Outcomes  Outcomes Reduced anxiety;Awareness of support;Connection to spiritual care    Chaplain responded to consult request for Major Life Transition. The patient shared her health issues and experiences that she had in one of the Nursing Facilities. She stated that she was seeking a Chaplain to discuss spiritual and religious matters.   Chaplain listened to her concerns, compassionately present, provided spiritual support and delivered a Bible. Chaplains remain available if needed.   M.Kubra Susanna Kerry Resident [phone]

## 2024-11-01 NOTE — TOC Progression Note (Addendum)
 Transition of Care [facility]) - Progression Note    Patient Details  Name: Jenny [name] MRN: [id number] Date of Birth: Oct 24, 1933  [facility]) CM/SW Contact  [name], [place] Phone Number: 11/01/2024, 9:40 AM  Clinical Narrative:   CSW met patient at bedside and provided medicare.gov ratings for SNF. Patient likes [facility] but asked CSW to follow up with her son, Ozell.   11:47 AM CSW spoke with Ozell about SNF and emailed him medicare.gov ratings/list of accepting facilities to [email or web address]. Ozell stated [place] more ideal for placement as that is where he lives. CSW to follow up with more [facility]. Ozell inquired about palliative/hospice and chaplain seeing patient. CSW informed Ozell that [facility] has seen patient but unsure if Chaplain has. CSW will follow up.   11:52 AM CSW asked MD to place consult (MD deferred to bedside RN). CSW paged Chaplain about patients/family request. Per Chaplain, someone will see patient today.   12:28 PM Ozell asked for CSW to expand search to [place]. CSW awaiting acceptances from other facilities. Ozell inquired about LOS at [facility] CSW informed that it is based on patients progress and that insurance covers 14-20 days at 100%. CSW also notified Ozell that a Chaplain will come by to see patient.   CSW will continue to follow.    Expected Discharge Plan: Skilled Nursing Facility Barriers to Discharge: Continued Medical Work up, SNF Pending bed offer               Expected Discharge Plan and Services In-house Referral: Clinical Social Work     Living arrangements for the past 2 months: Single Family Home Expected Discharge Date: 11/02/24                                     Social Drivers of Health (SDOH) Interventions SDOH Screenings   Food Insecurity: Patient Declined (10/29/2024)  Housing: Patient Declined (10/29/2024)  Transportation Needs: Patient  Declined (10/29/2024)  Utilities: Patient Declined (10/29/2024)  Alcohol Screen: Low Risk  (07/02/2024)  Depression (PHQ2-9): Low Risk  (07/22/2022)  Financial Resource Strain: Low Risk  (07/02/2024)  Social Connections: Patient Declined (10/29/2024)  Recent Concern: Social Connections - Socially Isolated (10/07/2024)  Tobacco Use: Medium Risk (10/28/2024)    Readmission Risk Interventions    07/05/2024   10:24 AM  Readmission Risk Prevention Plan  Transportation Screening Complete  HRI or Home Care Consult Complete  Social Work Consult for Recovery Care Planning/Counseling Complete  Palliative Care Screening Not Applicable  Medication Review Oceanographer) Complete

## 2024-11-01 NOTE — Inpatient Diabetes Management (Addendum)
 Inpatient Diabetes Program Recommendations  AACE/ADA: New Consensus Statement on Inpatient Glycemic Control (2015)  Target Ranges:  Prepandial:   less than 140 mg/dL      Peak postprandial:   less than 180 mg/dL (1-2 hours)      Critically ill patients:  140 - 180 mg/dL   Lab Results  Component Value Date   GLUCAP 199 (H) 11/01/2024   HGBA1C 8.4 (H) 10/31/2024    Review of Glycemic Control  Latest Reference Range & Units 10/31/24 15:51 10/31/24 21:16 11/01/24 06:12 11/01/24 06:25 11/01/24 07:02 11/01/24 07:28 11/01/24 07:31 11/01/24 10:33  Glucose-Capillary 70 - 99 mg/dL 790 (H) 810 (H) 40 (LL) 44 (LL) 28 (LL) 17 (LL) 197 (H) 199 (H)  (LL): Data is critically low (H): Data is abnormally high  Diabetes history: DM2 Outpatient Diabetes medications: None Current orders for Inpatient glycemic control: Prednisone  40 mg QD  Received referral for discharge recommendations.   Due to renal function, I don't recommend Metformin, or a sulfonylurea.  I don't think insulin  is safe at this time either due to hypo glycemia after receiving 3 units of Novolog  at 0600 and 2 units at 1300.    Please consider Prandin  1 mg with breakfast and dinner.  She must eat with this medication.  She should also eliminate regular sodas, juices, sweet tea and lemonages and be mindful of carbohydrates.    Monitor blood glucose ac/hs and follow up with PCP.    Thank you, Wyvonna Pinal, MSN, CDCES Diabetes Coordinator Inpatient Diabetes Program [phone] (team pager from 8a-5p)

## 2024-11-01 NOTE — Progress Notes (Signed)
 TRH   ROUNDING   NOTE Jenny [name] FMW:[id number]  DOB: [birth year]  DOA: 10/28/2024  [facility]P: [facility]   11/01/2024,10:05 AM  LOS: 4 days    Code Status: Full code     from: Home   89 year old female NICM with preserved EF NYHA class III last echo EF 60-65% LVH moderate MS in July 2025 on metolazone  not interested in Entresto  Aldactone not an SGLT2 candidate because of Candida Persistent A-fib Mobitz 2 on apixaban  had cardioversion-tachybradycardia syndrome COPD smoker till 2022 FEV1 58% 2024 CKD 4 baseline creatinine about 1.8 not really a good candidate for HD   Home meds include metolazone  Lasix  apixaban  2.5 gabapentin    Recent admission 10/29-11/12/2023 with probable acute COPD exacerbation wheezing etc.   Chronology  11/20 readmit with accidental fall and found to be tachypneic blue in the face and had a likely COPD exacerbation as well as skin tears on the right side as well as AKI on admission necessitating IV fluids also reported dark stool    Pertinent imaging/studies till date  CXR 1 view shows no acute cardiopulmonary process 13 mm right upper lobe nodule    Assessment  & Plan :    Severe hypoglycemia 11/23 Brittle diabetes secondary to renal insufficiency and persistence of insulin /sulfonylurea CBGs in the 20s-stop Amaryl  stop all sliding scale despite A1c of 8.4 Would only cover with sliding scale if above 200 and will reevaluate tomorrow Diabetic coordinator to discuss planning-likely would benefit from continuous CGM and very liberal sugar goals going forward Continue gabapentin  100 a.m., 200 p.m. for neuropathy ATN secondary to urinary flow obstruction-bladder scan over 300 cc on arrival Previously on diuretics for heart failure as per HPI ATN secondary to obstruction-+ PTA Lasix  40 twice daily metolazone  2.5 Clamping trial failed needs to learn how to use leg bag not a candidate for inpatient Foley removal Postobstructive diuresis good  overnight-saline lock IV 75 cc/H and get labs in morning Acute COPD exacerbation FEV1 58% 2024 Prior smoker till 2022 Initially on Solu-Medrol  rapid taper now prednisone  20 --- was on long-term prednisone  at home since last hospital stay 09/2024-Will need this at discharge She completed a course of doxycycline  on 11/23 for acute exacerbation and is improved continue chronically Continuing Brovana  15 twice daily Incruse Ellipta  1 puff daily and DuoNeb Q6 as needed Underlying NYHA class III EF 60-65% moderate MS quite dry-weight is 85 kg-currently 88-watch volume status carefully Not a candidate for diuretics at this time  Place mild fluid restriction tomorrow Persistent A-fib Mobitz 2 tachybradycardia syndrome No beta-blocker because of tachybradycardia-keep on monitors-currently in rate controlled A-fib without any rate controlling agent Continue apixaban  2.5 twice daily Reported dark but not bloody/tarry stool Hemoccult not performed hemoglobin has been stable in the 9-10 range over the past several days if dark stool can evaluate further Leukocytosis 2/2 demargination IV steroids-weaning the steroids and has improved MASD Fungal cream for inframammary Candida- Encounter for palliative care  was supposed to see palliative care December 3--appreciated with hospice at [facility]--Discussed this with her and they will follow her once she is discharged  Data Reviewed today:   WBC 10.3 hemoglobin 9.3 platelet 256 Sodium 135 potassium 3.8 BUN/creatinine now down to 69/2.3 calcium  6.4 GFR 19   DVT prophylaxis: Apixaban   Status is: Inpatient Remains inpatient appropriate because:   Willing to go to nursing home now but not ready medically    Dispo/Global plan: Unclear as above   Time 25   Subjective:  Notes severe hypoglycemia this morning She has had breakfast and it is now better we are awaiting a recheck Breathing is good no chest pain no cough She is sitting up at the  bedside  Objective + exam Vitals:   11/01/24 0500 11/01/24 0637 11/01/24 0726 11/01/24 0810  BP:  (!) 146/85  125/62  Pulse:  69  88  Resp:  (!) 22  20  Temp:    (!) 97.5 F (36.4 C)  TempSrc:    Tympanic  SpO2:   100% 90%  Weight: 88.2 kg     Height:       Filed Weights   10/30/24 0355 10/31/24 0500 11/01/24 0500  Weight: 85.7 kg 87.1 kg 88.2 kg     Examination:  Awake coherent a little bit sleepy Chest is clear no wheeze no rales no rhonchi S1-S2 no murmur seems to be in sinus/A-fib which is rate controlled do not see any PVCs Abdomen is soft She has no lower extremity edema Neuro is intact she is moving 4 limbs relatively well    Scheduled Meds:  apixaban   2.5 mg Oral BID   arformoterol   15 mcg Nebulization BID   And   umeclidinium bromide   1 puff Inhalation Daily   bacitracin    Topical BID   Chlorhexidine  Gluconate Cloth  6 each Topical Daily   dorzolamide   1 drop Both Eyes BID   gabapentin   100 mg Oral q AM   And   gabapentin   200 mg Oral QHS   latanoprost   1 drop Both Eyes QHS   nystatin    Topical TID   pantoprazole   40 mg Oral Daily   potassium chloride  SA  40 mEq Oral Daily   predniSONE   20 mg Oral QAC breakfast   sodium chloride  flush  3 mL Intravenous Q12H   Continuous Infusions:   acetaminophen , hydrOXYzine , ipratropium-albuterol , nitroGLYCERIN , polyethylene glycol, sodium chloride  flush  Jai-Gurmukh Lue [place], MD  [facility]

## 2024-11-02 ENCOUNTER — Other Ambulatory Visit (HOSPITAL_COMMUNITY): Payer: Self-pay

## 2024-11-02 DIAGNOSIS — I509 Heart failure, unspecified: Secondary | ICD-10-CM | POA: Diagnosis not present

## 2024-11-02 DIAGNOSIS — Q249 Congenital malformation of heart, unspecified: Secondary | ICD-10-CM | POA: Diagnosis not present

## 2024-11-02 LAB — CBC WITH DIFFERENTIAL/PLATELET
Abs Immature Granulocytes: 0.26 K/uL — ABNORMAL HIGH (ref 0.00–0.07)
Basophils Absolute: 0 K/uL (ref 0.0–0.1)
Basophils Relative: 0 %
Eosinophils Absolute: 0 K/uL (ref 0.0–0.5)
Eosinophils Relative: 0 %
HCT: 30 % — ABNORMAL LOW (ref 36.0–46.0)
Hemoglobin: 9.1 g/dL — ABNORMAL LOW (ref 12.0–15.0)
Immature Granulocytes: 3 %
Lymphocytes Relative: 5 %
Lymphs Abs: 0.4 K/uL — ABNORMAL LOW (ref 0.7–4.0)
MCH: 26.1 pg (ref 26.0–34.0)
MCHC: 30.3 g/dL (ref 30.0–36.0)
MCV: 86 fL (ref 80.0–100.0)
Monocytes Absolute: 0.6 K/uL (ref 0.1–1.0)
Monocytes Relative: 6 %
Neutro Abs: 7.3 K/uL (ref 1.7–7.7)
Neutrophils Relative %: 86 %
Platelets: 239 K/uL (ref 150–400)
RBC: 3.49 MIL/uL — ABNORMAL LOW (ref 3.87–5.11)
RDW: 18.8 % — ABNORMAL HIGH (ref 11.5–15.5)
WBC: 8.6 K/uL (ref 4.0–10.5)
nRBC: 0.5 % — ABNORMAL HIGH (ref 0.0–0.2)

## 2024-11-02 LAB — RENAL FUNCTION PANEL
Albumin: 2.7 g/dL — ABNORMAL LOW (ref 3.5–5.0)
Anion gap: 11 (ref 5–15)
BUN: 59 mg/dL — ABNORMAL HIGH (ref 8–23)
CO2: 24 mmol/L (ref 22–32)
Calcium: 6.9 mg/dL — ABNORMAL LOW (ref 8.9–10.3)
Chloride: 101 mmol/L (ref 98–111)
Creatinine, Ser: 1.87 mg/dL — ABNORMAL HIGH (ref 0.44–1.00)
GFR, Estimated: 25 mL/min — ABNORMAL LOW (ref >60)
Glucose, Bld: 252 mg/dL — ABNORMAL HIGH (ref 70–99)
Phosphorus: 3.1 mg/dL (ref 2.5–4.6)
Potassium: 3.9 mmol/L (ref 3.5–5.1)
Sodium: 136 mmol/L (ref 135–145)

## 2024-11-02 LAB — GLUCOSE, CAPILLARY
Glucose-Capillary: 95 mg/dL (ref 70–99)
Glucose-Capillary: 185 mg/dL — ABNORMAL HIGH (ref 70–99)
Glucose-Capillary: 297 mg/dL — ABNORMAL HIGH (ref 70–99)

## 2024-11-02 LAB — PARATHYROID HORMONE, INTACT (NO CA): PTH: 186 pg/mL — ABNORMAL HIGH (ref 15–65)

## 2024-11-02 MED ORDER — REPAGLINIDE 0.5 MG PO TABS: 0.5000 mg | ORAL_TABLET | Freq: Two times a day (BID) | ORAL | Status: DC

## 2024-11-02 MED ORDER — FUROSEMIDE 40 MG PO TABS: 40.0000 mg | ORAL_TABLET | ORAL | Status: DC

## 2024-11-02 MED ORDER — GABAPENTIN 100 MG PO CAPS: 100.0000 mg | ORAL_CAPSULE | ORAL | 0 refills | Status: DC

## 2024-11-02 MED ORDER — PREDNISONE 20 MG PO TABS: 20.0000 mg | ORAL_TABLET | Freq: Every day | ORAL | Status: DC

## 2024-11-02 NOTE — Progress Notes (Signed)
 Visited with pt. After morning report referral was given.  Upon my arrival pt said she did not ask for visit. Chaplain available as needed.  Rayleen Dade, [place], [facility], Pager [phone]

## 2024-11-02 NOTE — Progress Notes (Signed)
 DISCHARGE NOTE SNF Jenny [name] to be discharged [facility] per MD order. Patient verbalized understanding.  Skin clean, dry and intact without evidence of skin break down, no evidence of skin tears noted. IV catheter discontinued intact. Site without signs and symptoms of complications. Dressing and pressure applied. Pt denies pain at the site currently. No complaints noted.  Patient was discharge with foley catheter to [facility].    Discharge packet assembled. An After Visit Summary (AVS) was printed and given to the EMS personnel. Patient escorted via stretcher and discharged to designated Skilled Nursing Facility via private automobile by son. Report called to accepting facility; all questions and concerns addressed.   Lott Seelbach K Samiah Ricklefs, RN

## 2024-11-02 NOTE — Progress Notes (Signed)
 Mobility Specialist Progress Note:    11/02/24 1348  Mobility  Activity Turned to back - supine  Level of Assistance Standby assist, set-up cues, supervision of patient - no hands on  Assistive Device None  Range of Motion/Exercises Active  Activity Response Tolerated fair  Mobility Referral Yes  Mobility visit 1 Mobility  Mobility Specialist Start Time (ACUTE ONLY) 1348  Mobility Specialist Stop Time (ACUTE ONLY) 1355  Mobility Specialist Time Calculation (min) (ACUTE ONLY) 7 min   Received pt laying in bed agreeable to session. No c/o any symptoms. Pt able to perform movements decently w/ no issues. Left pt in bed w/ all needs met.   Venetia Keel Mobility Specialist Please Neurosurgeon or Rehab Office at [phone]

## 2024-11-02 NOTE — TOC Progression Note (Addendum)
 Transition of Care [facility]) - Progression Note    Patient Details  Name: SILVIA HIGHTOWER MRN: [id number] Date of Birth: [birth year]  Transition of Care [facility]) CM/SW Contact  [name], [place] Phone Number: 11/02/2024, 11:29 AM  Clinical Narrative:   Ozell called CSW to inform that [facility] in [place] was reviewing patient. Ozell stated patient wants to know if she will have a private room and how the food will taste. CSW informed Ozell that CSW will inquire about private room availability and if one is not available family can request with facility for patient to be moved into the next available private.   Ozell inquired about any updates in SNF beds. CSW informed him not at this time and CSW is awaiting response from [facility] in [place].   12:11 PM [facility] has declined at this time due to patients needs. CSW notified Ozell. Ozell is going to follow up with patient about [facility] and get back to CSW.   Per Massie with admissions, he will place patient on the top of the list for the next available private bed. CSW notified Ozell.   2:54 PM Ozell stated patient chose [facility]. MD informed CSW that patient is medically stable for discharge today. CSW informed Ozell and facility.   CSW will continue to follow.    Expected Discharge Plan: Skilled Nursing Facility Barriers to Discharge: Continued Medical Work up, SNF Pending bed offer               Expected Discharge Plan and Services In-house Referral: Clinical Social Work     Living arrangements for the past 2 months: Single Family Home Expected Discharge Date: 11/02/24                                     Social Drivers of Health (SDOH) Interventions SDOH Screenings   Food Insecurity: Patient Declined (10/29/2024)  Housing: Patient Declined (10/29/2024)  Transportation Needs: Patient Declined (10/29/2024)  Utilities: Patient Declined  (10/29/2024)  Alcohol Screen: Low Risk  (07/02/2024)  Depression (PHQ2-9): Low Risk  (07/22/2022)  Financial Resource Strain: Low Risk  (07/02/2024)  Social Connections: Patient Declined (10/29/2024)  Recent Concern: Social Connections - Socially Isolated (10/07/2024)  Tobacco Use: Medium Risk (10/28/2024)    Readmission Risk Interventions    07/05/2024   10:24 AM  Readmission Risk Prevention Plan  Transportation Screening Complete  HRI or Home Care Consult Complete  Social Work Consult for Recovery Care Planning/Counseling Complete  Palliative Care Screening Not Applicable  Medication Review Oceanographer) Complete

## 2024-11-02 NOTE — Progress Notes (Signed)
 Physical Therapy Treatment Patient Details Name: Jenny [name] MRN: [id number] DOB: 1933/10/17 Today's Date: 11/02/2024   History of Present Illness 89 yo admitted s/p fall with multiple skin tears and acute COPD exacerbation. Recent admission 10/06/24 for COPD exacerbation; hypokalemia. PMH: HFpEF, CKD, HTN, T2DM, COPD, Afib on Eliquis , CAD, HLD, glaucoma, obese.    PT Comments  The pt was agreeable to session with focus on obtaining orthostatic vitals, and attempting to progress OOB mobility as tolerated. The pt continues to need light assist to complete bed mobility, and to complete sit-stand transfers due to limited LE strength and power. Pt with good stability in BP with initial transfers, but with continued exertion (standing or walking) experiences increased anxiety due to SOB, increased RR and work of breathing despite SpO2 100% on 2L. The pt recovers well with cues for PLB during seated rest, but is limited to ~10 ft ambulation at this time or 3 min standing. Will continue to benefit from skilled PT acutely and after d/c to address deficits in strength, endurance, and stability. Could possibly benefit from management of anxiety related to SOB as the patient describes it as a panic that sets in when attempting to progress mobility or catch her breath, RN notified.   VITALS:  - supine in bed - BP: 140/59 (79); HR: 57bpm - sitting EOB - BP: 140/81 (97); HR: 80bpm - standing - BP: 138/46 (75); HR: 77bpm - sitting after 3 min standing - BP: 121/67 (82); HR: 62bpm (pt unable to wait for BP reading to complete before returning to sitting due to SOB and fatigue) - sitting EOB after walking 10 ft - 95/82 (88); HR: 96bpm    If plan is discharge home, recommend the following: A little help with walking and/or transfers;A little help with bathing/dressing/bathroom;Assistance with cooking/housework   Can travel by private vehicle     Yes  Equipment Recommendations  None recommended by PT     Recommendations for Other Services       Precautions / Restrictions Precautions Precautions: Fall Recall of Precautions/Restrictions: Intact Precaution/Restrictions Comments: multiple skin tears Restrictions Weight Bearing Restrictions Per Provider Order: No     Mobility  Bed Mobility Overal bed mobility: Needs Assistance Bed Mobility: Supine to Sit, Sit to Supine     Supine to sit: Min assist, HOB elevated, Used rails Sit to supine: Min assist   General bed mobility comments: minA to complete movement of LE, minA to LE to return to bed and cues to assist with repositioning    Transfers Overall transfer level: Needs assistance Equipment used: Rolling walker (2 wheels) Transfers: Sit to/from Stand Sit to Stand: Min assist           General transfer comment: minA to rise and steady, reports dizziness and fatigue with continued standing for BP, progressively with increased RR and work of breathing but SpO2 100% on 2L    Ambulation/Gait Ambulation/Gait assistance: Editor, Commissioning (Feet): 10 Feet Assistive device: Rolling walker (2 wheels) Gait Pattern/deviations: Step-to pattern, Decreased stride length, Shuffle, Trunk flexed Gait velocity: decreased     General Gait Details: small shuffling steps with minimal clearance or toe off, no overt buckling. increased RR but SpO2 100% on 2L. pt unable to carry conversation, reports significant fatigue, BP 95/82 (88)   Stairs             Wheelchair Mobility     Tilt Bed    Modified Rankin (Stroke Patients Only)  Balance Overall balance assessment: History of Falls, Needs assistance Sitting-balance support: Feet supported Sitting balance-Leahy Scale: Fair Sitting balance - Comments: prefers UE support, can tolerate without   Standing balance support: Bilateral upper extremity supported, During functional activity Standing balance-Leahy Scale: Poor Standing balance comment: dependent on BUE  support and minA                            Communication Communication Communication: Impaired Factors Affecting Communication: Hearing impaired  Cognition Arousal: Alert Behavior During Therapy: WFL for tasks assessed/performed   PT - Cognitive impairments: No apparent impairments                         Following commands: Intact      Cueing Cueing Techniques: Verbal cues  Exercises Other Exercises Other Exercises: standing marches 2  x 10    General Comments General comments (skin integrity, edema, etc.): BP largely stable with changes in position, SpO2 100% on 2L with all activity. pt reports significant anxiety with SOB      Pertinent Vitals/Pain Pain Assessment Pain Assessment: No/denies pain    Home Living                          Prior Function            PT Goals (current goals can now be found in the care plan section) Acute Rehab PT Goals Patient Stated Goal: to get her strength and balance better PT Goal Formulation: With patient Time For Goal Achievement: 11/13/24 Potential to Achieve Goals: Good Progress towards PT goals: Progressing toward goals    Frequency    Min 2X/week      PT Plan      Co-evaluation              AM-PAC PT 6 Clicks Mobility   Outcome Measure  Help needed turning from your back to your side while in a flat bed without using bedrails?: A Little Help needed moving from lying on your back to sitting on the side of a flat bed without using bedrails?: A Lot Help needed moving to and from a bed to a chair (including a wheelchair)?: A Little Help needed standing up from a chair using your arms (e.g., wheelchair or bedside chair)?: A Little Help needed to walk in hospital room?: Total (<20 ft) Help needed climbing 3-5 steps with a railing? : Total 6 Click Score: 13    End of Session Equipment Utilized During Treatment: Gait belt;Oxygen Activity Tolerance: Patient limited by  fatigue Patient left: with call bell/phone within reach;in bed;with bed alarm set Nurse Communication: Mobility status PT Visit Diagnosis: Repeated falls (R29.6)     Time: 1010-1040 PT Time Calculation (min) (ACUTE ONLY): 30 min  Charges:    $Gait Training: 8-22 mins $Therapeutic Exercise: 8-22 mins PT General Charges $$ ACUTE PT VISIT: 1 Visit                     Izetta Call, PT, DPT   Acute Rehabilitation Department Office [phone] Secure Chat Communication Preferred   Izetta JULIANNA Call 11/02/2024, 11:19 AM

## 2024-11-02 NOTE — TOC Transition Note (Signed)
 Transition of Care [facility]) - Discharge Note   Patient Details  Name: Jenny [name] MRN: [id number] Date of Birth: April 07, 1933  Transition [facility]) CM/SW Contact:  [name], LCSWA Phone Number: 11/02/2024, 3:29 PM   Clinical Narrative:   Patient will DC to: [facility] Anticipated DC date: 11/02/24  Family notified: Ozell (son) Transport by: Ozell (Son) [phone]    Per MD patient ready for DC to [facility]. RN to call report prior to discharge [phone]; room 4B). RN, patient, patient's family, and facility notified of DC. Discharge Summary and FL2 sent to facility. DC packet on chart. Patients son to provide transportation.   CSW will sign off for now as social work intervention is no longer needed. Please consult us  again if new needs arise.      Final next level of care: Skilled Nursing Facility Barriers to Discharge: Barriers Resolved   Patient Goals and CMS Choice Patient states their goals for this hospitalization and ongoing recovery are:: Unable to assess          Discharge Placement PASRR number recieved: 10/30/24            Patient chooses bed at: [facility] Patient to be transferred to facility by: Ozell (son) Name of family member notified: Ozell (son) Patient and family notified of of transfer: 11/02/24  Discharge Plan and Services Additional resources added to the After Visit Summary for   In-house Referral: Clinical Social Work                                   Social Drivers of Health (SDOH) Interventions SDOH Screenings   Food Insecurity: Patient Declined (10/29/2024)  Housing: Patient Declined (10/29/2024)  Transportation Needs: Patient Declined (10/29/2024)  Utilities: Patient Declined (10/29/2024)  Alcohol Screen: Low Risk  (07/02/2024)  Depression (PHQ2-9): Low Risk  (07/22/2022)  Financial Resource Strain: Low Risk  (07/02/2024)  Social Connections: Patient  Declined (10/29/2024)  Recent Concern: Social Connections - Socially Isolated (10/07/2024)  Tobacco Use: Medium Risk (10/28/2024)     Readmission Risk Interventions    07/05/2024   10:24 AM  Readmission Risk Prevention Plan  Transportation Screening Complete  HRI or Home Care Consult Complete  Social Work Consult for Recovery Care Planning/Counseling Complete  Palliative Care Screening Not Applicable  Medication Review Oceanographer) Complete

## 2024-11-02 NOTE — Progress Notes (Signed)
 Call [facility] twice I was placed on hold twice. Will call again later to give report to RN.

## 2024-11-02 NOTE — Discharge Summary (Addendum)
 Physician Discharge Summary  ANSHIKA PETHTEL FMW:[id number] DOB: 10-02-1933 DOA: 10/28/2024  PCP: [facility]   Admit date: 10/28/2024 Discharge date: 11/02/2024  Time spent: 40 minutes  Recommendations for Outpatient Follow-up:  Requires Chem-12 CBC 1 week and magnesium  as an outpatient at facility Please refer to urology as an outpatient for Foley dependence and voiding trials Maintain on chronic prednisone  20 mg daily ongoing Screen patient for sleep apnea in the outpatient setting Note that she is diabetic but had very brittle control during hospital stay with some hypoglycemia-we started Prandin  this hospital stay-titrated and watch her carefully for hypoglycemia in the outpatient setting Hospice to evaluate and follow-up patient she had previously been referred to [facility] [place] in the outpatient setting and will need to be seen by them at facility  Discharge Diagnoses:  MAIN problem for hospitalization   Bladder outlet obstruction with AKI ATN on admission Acute respiratory failure secondary to COPD exacerbation Encounter for palliative care  Please see below for itemized issues addressed in HOpsital- refer to other progress notes for clarity if needed  Discharge Condition: Improved but overall guarded  Diet recommendation: Heart healthy diabetic  Filed Weights   10/31/24 0500 11/01/24 0500 11/02/24 0502  Weight: 87.1 kg 88.2 kg 87.9 kg    History of present illness:  89 year old female NICM with preserved EF NYHA class III last echo EF 60-65% LVH moderate MS in July 2025 on metolazone  not interested in Entresto  Aldactone not an SGLT2 candidate because of Candida Persistent A-fib Mobitz 2 on apixaban  had cardioversion-tachybradycardia syndrome COPD smoker till 2022 FEV1 58% 2024 CKD 4 baseline creatinine about 1.8 not really a good candidate for HD   Home meds include metolazone  Lasix  apixaban  2.5 gabapentin    Recent admission  10/29-11/12/2023 with probable acute COPD exacerbation wheezing etc.   Chronology  11/20 readmit with accidental fall and found to be tachypneic blue in the face and had a likely COPD exacerbation as well as skin tears on the right side as well as AKI on admission necessitating IV fluids also reported dark stool      Pertinent imaging/studies till date  CXR 1 view shows no acute cardiopulmonary process 13 mm right upper lobe nodule     Assessment  & Plan :      Severe hypoglycemia 11/23 Hypoglycemia 2/2 persistence of insulin /sulfonylurea Diabetes mellitus type 2 A1c 8.4 this admission Would discharge on very low-dose Prandin  0.5 twice daily after discussion--- try to avoid hypoglycemia Continue gabapentin  100 a.m., 200 p.m. for neuropathy ATN secondary to urinary flow obstruction-bladder scan over 300 cc on arrival Previously on diuretics for heart failure as per HPI ATN secondary to obstruction-+ PTA Lasix  40 twice daily metolazone  2.5 Clamping trial failed needs to learn how to use leg bag not a candidate for inpatient Foley removal Postobstructive diuresis good overnight-kidney function continues to improve-patient will need to resume diuretics as below at a very low dose Acute COPD exacerbation FEV1 58% 2024 Prior smoker till 2022 Initially on Solu-Medrol  rapid taper now prednisone  20 --- was on long-term prednisone  at home since last hospital stay 09/2024-prescribing prednisone  long-term for the patient She completed a course of doxycycline  on 11/23 for acute exacerbation and is improved continue all chronic inhalers Underlying NYHA class III EF 60-65% moderate MS quite dry-weight is 85 kg-currently 88-watch volume status carefully Discharging on Lasix  every other day to ensure does not have heart failure needs labs in a week including magnesium  Persistent A-fib Mobitz 2  tachybradycardia syndrome No beta-blocker because of tachybradycardia-keep on monitors-currently in rate  controlled A-fib without any rate controlling agent Continue apixaban  2.5 twice daily Reported dark but not bloody/tarry stool Hemoccult not performed hemoglobin has been stable -no further workup Leukocytosis 2/2 demargination IV steroids-weaning the steroids and has improved MASD Fungal cream for inframammary Candida- Encounter for palliative care  was supposed to see palliative care December 3--appreciated with hospice at [facility]--Discussed this with her and they will follow her once she is discharged Hospice to follow at facility  Discharge Exam: Vitals:   11/02/24 1030 11/02/24 1140  BP: 95/82 114/86  Pulse: 96 89  Resp:  19  Temp:  97.8 F (36.6 C)  SpO2:  100%    Subj on day of d/c   She looks okay she has no distress she is breathing fair No dark stool no tarry stool  General Exam on discharge  EOMI NCAT no focal deficit Mild wheeze posterolaterally Abdomen soft no rebound no guarding Foley in place Trace lower extremity edema no JVD On monitors chronic A-fib  Discharge Instructions   Discharge Instructions     Continue Urinary Catheter   Complete by: As directed    Cotinue foley at time d/c   Diet - low sodium heart healthy   Complete by: As directed    Increase activity slowly   Complete by: As directed    No wound care   Complete by: As directed       Allergies as of 11/02/2024       Reactions   Aspirin  Other (See Comments)   Can take 81 mg not 325 mg -bleeding   Entresto  [sacubitril -valsartan ] Shortness Of Breath   COPD and reports increased SOB with Entresto    Atorvastatin  Itching, Rash   Itching and myaliga   Cyclobenzaprine     muscle relaxers-ulcer hemorrhage (hospitalized), ulcer hemorrhage   Lactose Intolerance (gi) Other (See Comments)   GI upset   Meperidine Hcl Other (See Comments)   Not known   Pravastatin  Rash   Propoxyphene    Hallucination, hallucinations, vomiting   Wound Dressing Adhesive Other (See Comments)   red,  stings        Medication List     STOP taking these medications    acetaminophen -codeine  300-15 MG tablet Commonly known as: TYLENOL  #2   metolazone  2.5 MG tablet Commonly known as: ZAROXOLYN        TAKE these medications    acetaminophen  500 MG tablet Commonly known as: TYLENOL  Take 1,000 mg by mouth every 6 (six) hours as needed for moderate pain (pain score 4-6).   apixaban  2.5 MG Tabs tablet Commonly known as: ELIQUIS  Take 1 tablet (2.5 mg total) by mouth 2 (two) times daily.   dorzolamide  2 % ophthalmic solution Commonly known as: TRUSOPT  Place 1 drop into both eyes 2 (two) times daily.   furosemide  40 MG tablet Commonly known as: LASIX  Take 1 tablet (40 mg total) by mouth every other day. What changed: when to take this   gabapentin  100 MG capsule Commonly known as: NEURONTIN  Take 1-2 capsules (100-200 mg total) by mouth See admin instructions. Take 1 capsule (100mg ) by mouth every morning and take 2 capsules (200mg ) by mouth at bedtime   hydrOXYzine  25 MG tablet Commonly known as: ATARAX  Take 25 mg by mouth every 8 (eight) hours as needed for itching.   ipratropium-albuterol  0.5-2.5 (3) MG/3ML Soln Commonly known as: DUONEB Take 3 mLs by nebulization every 6 (six) hours as needed.  latanoprost  0.005 % ophthalmic solution Commonly known as: XALATAN  Place 1 drop into both eyes at bedtime.   Nitrostat  0.4 MG SL tablet Generic drug: nitroGLYCERIN  DISSOLVE ONE TABLET UNDER TONGUE AS NEEDED FOR CHEST PAIN - MAY REPEAT TWICE-IF NO RELIEF GO TO NEAREST HOSPITAL ER   nystatin  powder Commonly known as: MYCOSTATIN /NYSTOP  Apply topically 3 (three) times daily. What changed:  how much to take when to take this reasons to take this   pantoprazole  40 MG tablet Commonly known as: Protonix  Take 1 tablet (40 mg total) by mouth daily.   potassium chloride  SA 20 MEQ tablet Commonly known as: KLOR-CON  M Taking 40meq daily   predniSONE  20 MG tablet Commonly  known as: DELTASONE  Take 1 tablet (20 mg total) by mouth daily.   rosuvastatin  20 MG tablet Commonly known as: CRESTOR  Take 20 mg by mouth daily.   Stiolto Respimat 2.5-2.5 MCG/ACT Aers Generic drug: Tiotropium Bromide-Olodaterol Inhale 2 puffs into the lungs daily.   Ventolin  HFA 108 (90 Base) MCG/ACT inhaler Generic drug: albuterol  Inhale 1 puff into the lungs every 6 (six) hours as needed for shortness of breath or wheezing.       Allergies  Allergen Reactions   Aspirin  Other (See Comments)    Can take 81 mg not 325 mg -bleeding   Entresto  [Sacubitril -Valsartan ] Shortness Of Breath    COPD and reports increased SOB with Entresto    Atorvastatin  Itching and Rash    Itching and myaliga     Cyclobenzaprine      muscle relaxers-ulcer hemorrhage (hospitalized), ulcer hemorrhage   Lactose Intolerance (Gi) Other (See Comments)    GI upset   Meperidine Hcl Other (See Comments)    Not known   Pravastatin  Rash   Propoxyphene     Hallucination, hallucinations, vomiting   Wound Dressing Adhesive Other (See Comments)    red, stings      The results of significant diagnostics from this hospitalization (including imaging, microbiology, ancillary and laboratory) are listed below for reference.    Significant Diagnostic Studies: DG Chest Port 1 View Result Date: 10/28/2024 CLINICAL DATA:  Shortness of breath. EXAM: PORTABLE CHEST 1 VIEW COMPARISON:  Chest radiograph dated 10/06/2024. FINDINGS: No focal consolidation, pleural effusion, or pneumothorax. A 13 mm nodule in the right upper lobe similar to prior radiograph. CT may provide better evaluation. Stable cardiac silhouette. Atherosclerotic calcification of the aorta. No acute osseous pathology. IMPRESSION: 1. No acute cardiopulmonary process. 2. A 13 mm right upper lobe nodule. CT may provide better evaluation. Electronically Signed   By: Vanetta Chou M.D.   On: 10/28/2024 15:10   US  RENAL Result Date:  10/08/2024 CLINICAL DATA:  Acute kidney injury. EXAM: RENAL / URINARY TRACT ULTRASOUND COMPLETE COMPARISON:  March 04, 2022 FINDINGS: Right Kidney: Renal measurements: 10.5 cm x 5.8 cm x 5.4 cm = volume: 172.64 mL. Echogenicity within normal limits. No mass or hydronephrosis visualized. Left Kidney: Renal measurements: 7.9 cm x 4.3 cm x 3.7 cm = volume: 61.5 mL. There is increased echogenicity of the renal parenchyma. No mass or hydronephrosis visualized. Bladder: Appears normal for degree of bladder distention. Other: None. IMPRESSION: Mildly atrophic echogenic left kidney. This represents a new finding when compared to the prior study and may represent sequelae associated with medical renal disease. Electronically Signed   By: Suzen Dials M.D.   On: 10/08/2024 22:02   DG Chest 1 View Result Date: 10/06/2024 EXAM: 1 VIEW(S) XRAY OF THE CHEST 10/06/2024 02:17:00 PM COMPARISON: 09/02/2024 CLINICAL HISTORY:  sob. Reason for exam: sob; Per triage notes: PT to etc via [facility] from home with co sob that stared recently. PT has also had multiple falls recently. PT denies hitting her head but had been dizzy when she stands. PT says she also has tingling in her chest. FINDINGS: LUNGS AND PLEURA: No focal pulmonary opacity. Nodular density in right upper lung unchanged since 09/26/2023 favors a benign process. No pulmonary edema. No pleural effusion. No pneumothorax. HEART AND MEDIASTINUM: Cardiomegaly. Aortic calcification. BONES AND SOFT TISSUES: No acute osseous abnormality. IMPRESSION: 1. No acute cardiopulmonary process identified. Electronically signed by: Waddell Calk MD 10/06/2024 03:40 PM EDT RP Workstation: [id number]    Microbiology: Recent Results (from the past 240 hours)  Resp panel by RT-PCR (RSV, Flu A&B, Covid) Anterior Nasal Swab     Status: None   Collection Time: 10/28/24  2:16 PM   Specimen: Anterior Nasal Swab  Result Value Ref Range Status   SARS Coronavirus 2 by RT PCR  NEGATIVE NEGATIVE Final   Influenza A by PCR NEGATIVE NEGATIVE Final   Influenza B by PCR NEGATIVE NEGATIVE Final    Comment: (NOTE) The Xpert Xpress SARS-CoV-2/FLU/RSV plus assay is intended as an aid in the diagnosis of influenza from Nasopharyngeal swab specimens and should not be used as a sole basis for treatment. Nasal washings and aspirates are unacceptable for Xpert Xpress SARS-CoV-2/FLU/RSV testing.  Fact Sheet for Patients: bloggercourse.com  Fact Sheet for Healthcare Providers: seriousbroker.it  This test is not yet approved or cleared by the [place]  FDA and has been authorized for detection and/or diagnosis of SARS-CoV-2 by FDA under an Emergency Use Authorization (EUA). This EUA will remain in effect (meaning this test can be used) for the duration of the COVID-19 declaration under Section 564(b)(1) of the Act, 21 U.S.C. section 360bbb-3(b)(1), unless the authorization is terminated or revoked.     Resp Syncytial Virus by PCR NEGATIVE NEGATIVE Final    Comment: (NOTE) Fact Sheet for Patients: bloggercourse.com  Fact Sheet for Healthcare Providers: seriousbroker.it  This test is not yet approved or cleared by the [place]  FDA and has been authorized for detection and/or diagnosis of SARS-CoV-2 by FDA under an Emergency Use Authorization (EUA). This EUA will remain in effect (meaning this test can be used) for the duration of the COVID-19 declaration under Section 564(b)(1) of the Act, 21 U.S.C. section 360bbb-3(b)(1), unless the authorization is terminated or revoked.  Performed at [facility], [address], [place], [place] [address]      Labs: Basic Metabolic Panel: Recent Labs  Lab 10/29/24 0406 10/30/24 0316 10/31/24 0148 11/01/24 0239 11/01/24 0913 11/02/24 0302  NA 131* 132* 132* 135 134* 136  K 3.6 4.5 4.0 3.8 3.3* 3.9  CL  85* 90* 92* 98 98 101  CO2 21* 24 24 25 22 24   GLUCOSE 266* 97 129* 82 230* 252*  BUN 71* 82* 80* 69* 64* 59*  CREATININE 3.47* 3.65* 3.16* 2.37* 2.19* 1.87*  CALCIUM  6.6* 7.0* 6.6* 6.4* 6.7* 6.9*  MG 1.9  --   --   --   --   --   PHOS  --   --   --  3.7  --  3.1   Liver Function Tests: Recent Labs  Lab 10/29/24 0406 11/01/24 0239 11/02/24 0302  AST 33  --   --   ALT 33  --   --   ALKPHOS 92  --   --   BILITOT 1.2  --   --  PROT 6.0*  --   --   ALBUMIN 3.1* 2.8* 2.7*   No results for input(s): LIPASE, AMYLASE in the last 168 hours. No results for input(s): AMMONIA in the last 168 hours. CBC: Recent Labs  Lab 10/28/24 1416 10/29/24 0406 10/30/24 0316 11/01/24 0239 11/02/24 0302  WBC 10.9* 13.2* 16.2* 10.3 8.6  NEUTROABS 9.1*  --  14.9* 8.8* 7.3  HGB 12.2 10.6* 9.5* 9.3* 9.1*  HCT 39.5 33.8* 30.2* 30.0* 30.0*  MCV 84.0 82.4 82.1 84.5 86.0  PLT 302 296 294 256 239   Cardiac Enzymes: Recent Labs  Lab 10/28/24 1416  CKTOTAL 334*   BNP: BNP (last 3 results) Recent Labs    10/06/24 1358 10/28/24 1416 10/29/24 0406  BNP 138.7* 84.1 265.1*    ProBNP (last 3 results) No results for input(s): PROBNP in the last 8760 hours.  CBG: Recent Labs  Lab 11/01/24 1033 11/01/24 1558 11/01/24 2049 11/02/24 0559 11/02/24 1139  GLUCAP 199* 181* 243* 95 185*    Signed:  Colen Grimes MD   [facility] 11/02/2024, 2:55 PM

## 2024-11-14 ENCOUNTER — Inpatient Hospital Stay

## 2024-11-14 ENCOUNTER — Other Ambulatory Visit: Payer: Self-pay

## 2024-11-14 ENCOUNTER — Inpatient Hospital Stay
Admission: EM | Admit: 2024-11-14 | Discharge: 2024-12-05 | DRG: 853 | Disposition: A | Discharge status: Skilled Nursing Facility | Attending: Internal Medicine | Admitting: Internal Medicine

## 2024-11-14 ENCOUNTER — Emergency Department

## 2024-11-14 DIAGNOSIS — Z7901 Long term (current) use of anticoagulants: Secondary | ICD-10-CM | POA: Diagnosis not present

## 2024-11-14 DIAGNOSIS — I4891 Unspecified atrial fibrillation: Secondary | ICD-10-CM | POA: Diagnosis not present

## 2024-11-14 DIAGNOSIS — N1832 Chronic kidney disease, stage 3b: Secondary | ICD-10-CM | POA: Diagnosis present

## 2024-11-14 DIAGNOSIS — E66813 Obesity, class 3: Secondary | ICD-10-CM | POA: Diagnosis present

## 2024-11-14 DIAGNOSIS — L03115 Cellulitis of right lower limb: Secondary | ICD-10-CM | POA: Diagnosis not present

## 2024-11-14 DIAGNOSIS — I70221 Atherosclerosis of native arteries of extremities with rest pain, right leg: Secondary | ICD-10-CM | POA: Diagnosis not present

## 2024-11-14 DIAGNOSIS — I878 Other specified disorders of veins: Secondary | ICD-10-CM | POA: Diagnosis present

## 2024-11-14 DIAGNOSIS — Z9889 Other specified postprocedural states: Secondary | ICD-10-CM | POA: Diagnosis not present

## 2024-11-14 DIAGNOSIS — Z823 Family history of stroke: Secondary | ICD-10-CM

## 2024-11-14 DIAGNOSIS — Z604 Social exclusion and rejection: Secondary | ICD-10-CM | POA: Diagnosis present

## 2024-11-14 DIAGNOSIS — J441 Chronic obstructive pulmonary disease with (acute) exacerbation: Secondary | ICD-10-CM | POA: Diagnosis present

## 2024-11-14 DIAGNOSIS — E875 Hyperkalemia: Secondary | ICD-10-CM | POA: Diagnosis present

## 2024-11-14 DIAGNOSIS — I130 Hypertensive heart and chronic kidney disease with heart failure and stage 1 through stage 4 chronic kidney disease, or unspecified chronic kidney disease: Secondary | ICD-10-CM | POA: Diagnosis present

## 2024-11-14 DIAGNOSIS — L97919 Non-pressure chronic ulcer of unspecified part of right lower leg with unspecified severity: Secondary | ICD-10-CM | POA: Diagnosis present

## 2024-11-14 DIAGNOSIS — E873 Alkalosis: Secondary | ICD-10-CM | POA: Diagnosis present

## 2024-11-14 DIAGNOSIS — I739 Peripheral vascular disease, unspecified: Secondary | ICD-10-CM | POA: Diagnosis not present

## 2024-11-14 DIAGNOSIS — Z515 Encounter for palliative care: Secondary | ICD-10-CM

## 2024-11-14 DIAGNOSIS — L03116 Cellulitis of left lower limb: Secondary | ICD-10-CM | POA: Diagnosis not present

## 2024-11-14 DIAGNOSIS — J101 Influenza due to other identified influenza virus with other respiratory manifestations: Secondary | ICD-10-CM | POA: Diagnosis not present

## 2024-11-14 DIAGNOSIS — E785 Hyperlipidemia, unspecified: Secondary | ICD-10-CM | POA: Diagnosis present

## 2024-11-14 DIAGNOSIS — L03119 Cellulitis of unspecified part of limb: Secondary | ICD-10-CM | POA: Diagnosis present

## 2024-11-14 DIAGNOSIS — Z95828 Presence of other vascular implants and grafts: Secondary | ICD-10-CM | POA: Diagnosis not present

## 2024-11-14 DIAGNOSIS — N179 Acute kidney failure, unspecified: Secondary | ICD-10-CM | POA: Diagnosis not present

## 2024-11-14 DIAGNOSIS — I10 Essential (primary) hypertension: Secondary | ICD-10-CM | POA: Diagnosis present

## 2024-11-14 DIAGNOSIS — I2699 Other pulmonary embolism without acute cor pulmonale: Secondary | ICD-10-CM | POA: Diagnosis present

## 2024-11-14 DIAGNOSIS — Z79899 Other long term (current) drug therapy: Secondary | ICD-10-CM | POA: Diagnosis not present

## 2024-11-14 DIAGNOSIS — R609 Edema, unspecified: Secondary | ICD-10-CM | POA: Diagnosis not present

## 2024-11-14 DIAGNOSIS — N184 Chronic kidney disease, stage 4 (severe): Secondary | ICD-10-CM | POA: Diagnosis present

## 2024-11-14 DIAGNOSIS — L97929 Non-pressure chronic ulcer of unspecified part of left lower leg with unspecified severity: Secondary | ICD-10-CM | POA: Diagnosis present

## 2024-11-14 DIAGNOSIS — R911 Solitary pulmonary nodule: Secondary | ICD-10-CM | POA: Diagnosis present

## 2024-11-14 DIAGNOSIS — J810 Acute pulmonary edema: Secondary | ICD-10-CM | POA: Diagnosis present

## 2024-11-14 DIAGNOSIS — Z7189 Other specified counseling: Secondary | ICD-10-CM | POA: Diagnosis not present

## 2024-11-14 DIAGNOSIS — J9601 Acute respiratory failure with hypoxia: Secondary | ICD-10-CM | POA: Diagnosis present

## 2024-11-14 DIAGNOSIS — E876 Hypokalemia: Secondary | ICD-10-CM | POA: Diagnosis present

## 2024-11-14 DIAGNOSIS — Z8249 Family history of ischemic heart disease and other diseases of the circulatory system: Secondary | ICD-10-CM

## 2024-11-14 DIAGNOSIS — J440 Chronic obstructive pulmonary disease with acute lower respiratory infection: Secondary | ICD-10-CM | POA: Diagnosis not present

## 2024-11-14 DIAGNOSIS — I110 Hypertensive heart disease with heart failure: Secondary | ICD-10-CM | POA: Diagnosis not present

## 2024-11-14 DIAGNOSIS — I495 Sick sinus syndrome: Secondary | ICD-10-CM | POA: Diagnosis present

## 2024-11-14 DIAGNOSIS — Z7952 Long term (current) use of systemic steroids: Secondary | ICD-10-CM

## 2024-11-14 DIAGNOSIS — I872 Venous insufficiency (chronic) (peripheral): Secondary | ICD-10-CM | POA: Diagnosis present

## 2024-11-14 DIAGNOSIS — Z888 Allergy status to other drugs, medicaments and biological substances status: Secondary | ICD-10-CM

## 2024-11-14 DIAGNOSIS — I509 Heart failure, unspecified: Secondary | ICD-10-CM | POA: Diagnosis not present

## 2024-11-14 DIAGNOSIS — I5033 Acute on chronic diastolic (congestive) heart failure: Secondary | ICD-10-CM | POA: Diagnosis present

## 2024-11-14 DIAGNOSIS — I252 Old myocardial infarction: Secondary | ICD-10-CM

## 2024-11-14 DIAGNOSIS — I70239 Atherosclerosis of native arteries of right leg with ulceration of unspecified site: Secondary | ICD-10-CM | POA: Diagnosis not present

## 2024-11-14 DIAGNOSIS — Z789 Other specified health status: Secondary | ICD-10-CM | POA: Diagnosis not present

## 2024-11-14 DIAGNOSIS — E1122 Type 2 diabetes mellitus with diabetic chronic kidney disease: Secondary | ICD-10-CM | POA: Diagnosis present

## 2024-11-14 DIAGNOSIS — Z87891 Personal history of nicotine dependence: Secondary | ICD-10-CM | POA: Diagnosis not present

## 2024-11-14 DIAGNOSIS — M79604 Pain in right leg: Secondary | ICD-10-CM | POA: Diagnosis not present

## 2024-11-14 DIAGNOSIS — I743 Embolism and thrombosis of arteries of the lower extremities: Secondary | ICD-10-CM | POA: Diagnosis not present

## 2024-11-14 DIAGNOSIS — I70249 Atherosclerosis of native arteries of left leg with ulceration of unspecified site: Secondary | ICD-10-CM | POA: Diagnosis not present

## 2024-11-14 DIAGNOSIS — L97819 Non-pressure chronic ulcer of other part of right lower leg with unspecified severity: Secondary | ICD-10-CM | POA: Diagnosis not present

## 2024-11-14 DIAGNOSIS — I2510 Atherosclerotic heart disease of native coronary artery without angina pectoris: Secondary | ICD-10-CM | POA: Diagnosis present

## 2024-11-14 DIAGNOSIS — H409 Unspecified glaucoma: Secondary | ICD-10-CM | POA: Diagnosis present

## 2024-11-14 DIAGNOSIS — Z886 Allergy status to analgesic agent status: Secondary | ICD-10-CM

## 2024-11-14 DIAGNOSIS — E1140 Type 2 diabetes mellitus with diabetic neuropathy, unspecified: Secondary | ICD-10-CM | POA: Diagnosis present

## 2024-11-14 DIAGNOSIS — Z1152 Encounter for screening for COVID-19: Secondary | ICD-10-CM | POA: Diagnosis not present

## 2024-11-14 DIAGNOSIS — Z66 Do not resuscitate: Secondary | ICD-10-CM | POA: Diagnosis present

## 2024-11-14 DIAGNOSIS — E1151 Type 2 diabetes mellitus with diabetic peripheral angiopathy without gangrene: Secondary | ICD-10-CM | POA: Diagnosis present

## 2024-11-14 DIAGNOSIS — T502X5A Adverse effect of carbonic-anhydrase inhibitors, benzothiadiazides and other diuretics, initial encounter: Secondary | ICD-10-CM | POA: Diagnosis present

## 2024-11-14 DIAGNOSIS — F419 Anxiety disorder, unspecified: Secondary | ICD-10-CM | POA: Diagnosis present

## 2024-11-14 DIAGNOSIS — I4821 Permanent atrial fibrillation: Secondary | ICD-10-CM | POA: Diagnosis present

## 2024-11-14 DIAGNOSIS — R5381 Other malaise: Secondary | ICD-10-CM | POA: Diagnosis present

## 2024-11-14 DIAGNOSIS — A419 Sepsis, unspecified organism: Principal | ICD-10-CM | POA: Diagnosis present

## 2024-11-14 DIAGNOSIS — E739 Lactose intolerance, unspecified: Secondary | ICD-10-CM | POA: Diagnosis present

## 2024-11-14 DIAGNOSIS — Z91048 Other nonmedicinal substance allergy status: Secondary | ICD-10-CM

## 2024-11-14 LAB — COMPREHENSIVE METABOLIC PANEL WITH GFR
ALT: 20 U/L (ref 0–44)
AST: 17 U/L (ref 15–41)
Albumin: 3.7 g/dL (ref 3.5–5.0)
Alkaline Phosphatase: 129 U/L — ABNORMAL HIGH (ref 38–126)
Anion gap: 12 (ref 5–15)
BUN: 43 mg/dL — ABNORMAL HIGH (ref 8–23)
CO2: 25 mmol/L (ref 22–32)
Calcium: 8.5 mg/dL — ABNORMAL LOW (ref 8.9–10.3)
Chloride: 98 mmol/L (ref 98–111)
Creatinine, Ser: 1.39 mg/dL — ABNORMAL HIGH (ref 0.44–1.00)
GFR, Estimated: 36 mL/min — ABNORMAL LOW (ref >60)
Glucose, Bld: 127 mg/dL — ABNORMAL HIGH (ref 70–99)
Potassium: 5.1 mmol/L (ref 3.5–5.1)
Sodium: 135 mmol/L (ref 135–145)
Total Bilirubin: 0.3 mg/dL (ref 0.0–1.2)
Total Protein: 6.1 g/dL — ABNORMAL LOW (ref 6.5–8.1)

## 2024-11-14 LAB — RESP PANEL BY RT-PCR (RSV, FLU A&B, COVID)  RVPGX2
Influenza A by PCR: NEGATIVE
Influenza B by PCR: NEGATIVE
Resp Syncytial Virus by PCR: NEGATIVE
SARS Coronavirus 2 by RT PCR: NEGATIVE

## 2024-11-14 LAB — GLUCOSE, CAPILLARY: Glucose-Capillary: 148 mg/dL — ABNORMAL HIGH (ref 70–99)

## 2024-11-14 LAB — CBC WITH DIFFERENTIAL/PLATELET
Abs Immature Granulocytes: 0.18 K/uL — ABNORMAL HIGH (ref 0.00–0.07)
Basophils Absolute: 0 K/uL (ref 0.0–0.1)
Basophils Relative: 0 %
Eosinophils Absolute: 0 K/uL (ref 0.0–0.5)
Eosinophils Relative: 0 %
HCT: 32.6 % — ABNORMAL LOW (ref 36.0–46.0)
Hemoglobin: 9.8 g/dL — ABNORMAL LOW (ref 12.0–15.0)
Immature Granulocytes: 2 %
Lymphocytes Relative: 3 %
Lymphs Abs: 0.3 K/uL — ABNORMAL LOW (ref 0.7–4.0)
MCH: 25.8 pg — ABNORMAL LOW (ref 26.0–34.0)
MCHC: 30.1 g/dL (ref 30.0–36.0)
MCV: 85.8 fL (ref 80.0–100.0)
Monocytes Absolute: 0.7 K/uL (ref 0.1–1.0)
Monocytes Relative: 7 %
Neutro Abs: 9.4 K/uL — ABNORMAL HIGH (ref 1.7–7.7)
Neutrophils Relative %: 88 %
Platelets: 239 K/uL (ref 150–400)
RBC: 3.8 MIL/uL — ABNORMAL LOW (ref 3.87–5.11)
RDW: 19.6 % — ABNORMAL HIGH (ref 11.5–15.5)
WBC: 10.7 K/uL — ABNORMAL HIGH (ref 4.0–10.5)
nRBC: 1.4 % — ABNORMAL HIGH (ref 0.0–0.2)

## 2024-11-14 LAB — HEPARIN LEVEL (UNFRACTIONATED): Heparin Unfractionated: >1.1 [IU]/mL — ABNORMAL HIGH (ref 0.30–0.70)

## 2024-11-14 LAB — PRO BRAIN NATRIURETIC PEPTIDE: Pro Brain Natriuretic Peptide: 2277 pg/mL — ABNORMAL HIGH (ref <300.0)

## 2024-11-14 LAB — PROTIME-INR
INR: 1.3 — ABNORMAL HIGH (ref 0.8–1.2)
Prothrombin Time: 16.5 s — ABNORMAL HIGH (ref 11.4–15.2)

## 2024-11-14 LAB — CBG MONITORING, ED
Glucose-Capillary: 167 mg/dL — ABNORMAL HIGH (ref 70–99)
Glucose-Capillary: 283 mg/dL — ABNORMAL HIGH (ref 70–99)
Glucose-Capillary: 163 mg/dL — ABNORMAL HIGH (ref 70–99)

## 2024-11-14 LAB — APTT
aPTT: 41 s — ABNORMAL HIGH (ref 24–36)
aPTT: 195 s (ref 24–36)

## 2024-11-14 LAB — LACTIC ACID, PLASMA
Lactic Acid, Venous: 2.1 mmol/L (ref 0.5–1.9)
Lactic Acid, Venous: 1.3 mmol/L (ref 0.5–1.9)

## 2024-11-14 LAB — TROPONIN T, HIGH SENSITIVITY
Troponin T High Sensitivity: 108 ng/L (ref 0–19)
Troponin T High Sensitivity: 87 ng/L — ABNORMAL HIGH (ref 0–19)

## 2024-11-14 MED ORDER — ONDANSETRON HCL 4 MG/2ML IJ SOLN
4.0000 mg | Freq: Once | INTRAMUSCULAR | Status: AC
  Administered 2024-11-14: 4 mg via INTRAVENOUS
  Filled 2024-11-14: qty 2

## 2024-11-14 MED ORDER — LACTATED RINGERS IV BOLUS
1000.0000 mL | Freq: Once | INTRAVENOUS | Status: AC
  Administered 2024-11-14: 1000 mL via INTRAVENOUS

## 2024-11-14 MED ORDER — CEFAZOLIN SODIUM-DEXTROSE 1-4 GM/50ML-% IV SOLN
1.0000 g | Freq: Three times a day (TID) | INTRAVENOUS | Status: DC
  Filled 2024-11-14: qty 50

## 2024-11-14 MED ORDER — ORAL CARE MOUTH RINSE: 15.0000 mL | OROMUCOSAL | Status: DC | PRN

## 2024-11-14 MED ORDER — METHYLPREDNISOLONE SODIUM SUCC 125 MG IJ SOLR
125.0000 mg | Freq: Once | INTRAMUSCULAR | Status: AC
  Administered 2024-11-14: 125 mg via INTRAVENOUS
  Filled 2024-11-14: qty 2

## 2024-11-14 MED ORDER — VANCOMYCIN HCL 1500 MG/300ML IV SOLN
1500.0000 mg | Freq: Once | INTRAVENOUS | Status: AC
  Administered 2024-11-14: 1500 mg via INTRAVENOUS
  Filled 2024-11-14: qty 300

## 2024-11-14 MED ORDER — SODIUM CHLORIDE 0.9 % IV SOLN
2.0000 g | Freq: Once | INTRAVENOUS | Status: AC
  Administered 2024-11-14: 2 g via INTRAVENOUS
  Filled 2024-11-14: qty 20

## 2024-11-14 MED ORDER — ALBUTEROL SULFATE (2.5 MG/3ML) 0.083% IN NEBU
2.5000 mg | INHALATION_SOLUTION | RESPIRATORY_TRACT | Status: DC | PRN
  Administered 2024-11-19 – 2024-11-29 (×20): 2.5 mg via RESPIRATORY_TRACT
  Filled 2024-11-14 (×19): qty 3

## 2024-11-14 MED ORDER — ONDANSETRON HCL 4 MG/2ML IJ SOLN: 4.0000 mg | Freq: Four times a day (QID) | INTRAMUSCULAR | Status: DC | PRN

## 2024-11-14 MED ORDER — IPRATROPIUM-ALBUTEROL 0.5-2.5 (3) MG/3ML IN SOLN
3.0000 mL | RESPIRATORY_TRACT | Status: AC
  Administered 2024-11-14 (×2): 3 mL via RESPIRATORY_TRACT
  Filled 2024-11-14 (×2): qty 3

## 2024-11-14 MED ORDER — POTASSIUM CHLORIDE CRYS ER 20 MEQ PO TBCR
40.0000 meq | EXTENDED_RELEASE_TABLET | Freq: Every day | ORAL | Status: DC
  Administered 2024-11-14: 40 meq via ORAL
  Filled 2024-11-14: qty 2

## 2024-11-14 MED ORDER — LACTATED RINGERS IV BOLUS (SEPSIS)
1000.0000 mL | Freq: Once | INTRAVENOUS | Status: AC
  Administered 2024-11-14: 1000 mL via INTRAVENOUS

## 2024-11-14 MED ORDER — ROSUVASTATIN CALCIUM 10 MG PO TABS
20.0000 mg | ORAL_TABLET | Freq: Every day | ORAL | Status: DC
  Administered 2024-11-14 – 2024-12-03 (×19): 20 mg via ORAL
  Filled 2024-11-14 (×2): qty 1
  Filled 2024-11-14: qty 2
  Filled 2024-11-14: qty 1
  Filled 2024-11-14 (×3): qty 2
  Filled 2024-11-14 (×4): qty 1
  Filled 2024-11-14 (×2): qty 2
  Filled 2024-11-14: qty 1
  Filled 2024-11-14: qty 2
  Filled 2024-11-14 (×3): qty 1
  Filled 2024-11-14: qty 2
  Filled 2024-11-14: qty 1

## 2024-11-14 MED ORDER — IPRATROPIUM-ALBUTEROL 0.5-2.5 (3) MG/3ML IN SOLN
3.0000 mL | Freq: Four times a day (QID) | RESPIRATORY_TRACT | Status: DC
  Administered 2024-11-14 – 2024-11-15 (×6): 3 mL via RESPIRATORY_TRACT
  Filled 2024-11-14 (×6): qty 3

## 2024-11-14 MED ORDER — PREDNISONE 10 MG PO TABS: 10.0000 mg | ORAL_TABLET | Freq: Every day | ORAL | Status: DC

## 2024-11-14 MED ORDER — MORPHINE SULFATE (PF) 2 MG/ML IV SOLN: 2.0000 mg | INTRAVENOUS | Status: DC | PRN

## 2024-11-14 MED ORDER — HYDROXYZINE HCL 25 MG PO TABS
25.0000 mg | ORAL_TABLET | Freq: Three times a day (TID) | ORAL | Status: DC | PRN
  Administered 2024-11-15 – 2024-11-29 (×4): 25 mg via ORAL
  Filled 2024-11-14 (×4): qty 1

## 2024-11-14 MED ORDER — GUAIFENESIN ER 600 MG PO TB12
600.0000 mg | ORAL_TABLET | Freq: Two times a day (BID) | ORAL | Status: DC
  Administered 2024-11-14 – 2024-12-03 (×38): 600 mg via ORAL
  Filled 2024-11-14 (×39): qty 1

## 2024-11-14 MED ORDER — SODIUM CHLORIDE 0.9 % IV SOLN: 1.0000 g | INTRAVENOUS | Status: DC

## 2024-11-14 MED ORDER — ACETAMINOPHEN 325 MG PO TABS
650.0000 mg | ORAL_TABLET | Freq: Four times a day (QID) | ORAL | Status: DC | PRN
  Administered 2024-11-23 – 2024-11-29 (×3): 650 mg via ORAL
  Filled 2024-11-14 (×3): qty 2

## 2024-11-14 MED ORDER — OXYCODONE HCL 5 MG PO TABS
5.0000 mg | ORAL_TABLET | ORAL | Status: DC | PRN
  Administered 2024-11-15 – 2024-11-29 (×7): 5 mg via ORAL
  Filled 2024-11-14 (×9): qty 1

## 2024-11-14 MED ORDER — IOHEXOL 350 MG/ML SOLN
75.0000 mL | Freq: Once | INTRAVENOUS | Status: AC | PRN
  Administered 2024-11-14: 75 mL via INTRAVENOUS

## 2024-11-14 MED ORDER — PREDNISONE 20 MG PO TABS: 40.0000 mg | ORAL_TABLET | Freq: Every day | ORAL | Status: DC

## 2024-11-14 MED ORDER — GABAPENTIN 100 MG PO CAPS
100.0000 mg | ORAL_CAPSULE | Freq: Every day | ORAL | Status: DC
  Administered 2024-11-14 – 2024-12-04 (×20): 100 mg via ORAL
  Filled 2024-11-14 (×21): qty 1

## 2024-11-14 MED ORDER — METHYLPREDNISOLONE SODIUM SUCC 40 MG IJ SOLR: 40.0000 mg | Freq: Two times a day (BID) | INTRAMUSCULAR | Status: DC

## 2024-11-14 MED ORDER — LATANOPROST 0.005 % OP SOLN
1.0000 [drp] | Freq: Every day | OPHTHALMIC | Status: DC
  Administered 2024-11-14 – 2024-12-02 (×19): 1 [drp] via OPHTHALMIC
  Filled 2024-11-14 (×4): qty 2.5

## 2024-11-14 MED ORDER — HYDROCERIN EX CREA
TOPICAL_CREAM | Freq: Two times a day (BID) | CUTANEOUS | Status: DC
  Administered 2024-11-17 – 2024-11-19 (×2): 1 via TOPICAL
  Filled 2024-11-14 (×3): qty 113

## 2024-11-14 MED ORDER — INSULIN ASPART 100 UNIT/ML IJ SOLN
0.0000 [IU] | Freq: Three times a day (TID) | INTRAMUSCULAR | Status: DC
  Administered 2024-11-14: 11 [IU] via SUBCUTANEOUS
  Administered 2024-11-14 – 2024-11-15 (×5): 4 [IU] via SUBCUTANEOUS
  Administered 2024-11-16: 3 [IU] via SUBCUTANEOUS
  Administered 2024-11-16: 4 [IU] via SUBCUTANEOUS
  Administered 2024-11-16: 3 [IU] via SUBCUTANEOUS
  Filled 2024-11-14 (×2): qty 4
  Filled 2024-11-14: qty 3
  Filled 2024-11-14: qty 4
  Filled 2024-11-14: qty 11
  Filled 2024-11-14 (×3): qty 4
  Filled 2024-11-14: qty 3

## 2024-11-14 MED ORDER — ONDANSETRON HCL 4 MG PO TABS: 4.0000 mg | ORAL_TABLET | Freq: Four times a day (QID) | ORAL | Status: DC | PRN

## 2024-11-14 MED ORDER — UMECLIDINIUM BROMIDE 62.5 MCG/ACT IN AEPB
1.0000 | INHALATION_SPRAY | Freq: Every day | RESPIRATORY_TRACT | Status: DC
  Administered 2024-11-14 – 2024-11-20 (×7): 1 via RESPIRATORY_TRACT
  Filled 2024-11-14 (×2): qty 7

## 2024-11-14 MED ORDER — PREDNISONE 20 MG PO TABS
20.0000 mg | ORAL_TABLET | Freq: Every day | ORAL | Status: DC
  Administered 2024-11-15 – 2024-11-16 (×2): 20 mg via ORAL
  Filled 2024-11-14 (×2): qty 1

## 2024-11-14 MED ORDER — CEFAZOLIN SODIUM-DEXTROSE 1-4 GM/50ML-% IV SOLN
1.0000 g | Freq: Three times a day (TID) | INTRAVENOUS | Status: DC
  Administered 2024-11-14 – 2024-11-16 (×5): 1 g via INTRAVENOUS
  Filled 2024-11-14 (×7): qty 50

## 2024-11-14 MED ORDER — HEPARIN (PORCINE) 25000 UT/250ML-% IV SOLN
900.0000 [IU]/h | INTRAVENOUS | Status: DC
  Administered 2024-11-14: 1000 [IU]/h via INTRAVENOUS
  Administered 2024-11-18: 900 [IU]/h via INTRAVENOUS
  Administered 2024-11-20 – 2024-11-28 (×6): 750 [IU]/h via INTRAVENOUS
  Filled 2024-11-14 (×12): qty 250

## 2024-11-14 MED ORDER — ACETAMINOPHEN 650 MG RE SUPP: 650.0000 mg | Freq: Four times a day (QID) | RECTAL | Status: DC | PRN

## 2024-11-14 MED ORDER — FENTANYL CITRATE (PF) 50 MCG/ML IJ SOSY
50.0000 ug | PREFILLED_SYRINGE | Freq: Once | INTRAMUSCULAR | Status: AC
  Administered 2024-11-14: 50 ug via INTRAVENOUS
  Filled 2024-11-14: qty 1

## 2024-11-14 MED ORDER — FUROSEMIDE 10 MG/ML IJ SOLN
40.0000 mg | Freq: Once | INTRAMUSCULAR | Status: AC
  Administered 2024-11-14: 40 mg via INTRAVENOUS
  Filled 2024-11-14: qty 4

## 2024-11-14 MED ORDER — VANCOMYCIN HCL IN DEXTROSE 1-5 GM/200ML-% IV SOLN
1000.0000 mg | Freq: Once | INTRAVENOUS | Status: AC
  Administered 2024-11-14: 1000 mg via INTRAVENOUS
  Filled 2024-11-14: qty 200

## 2024-11-14 MED ORDER — VANCOMYCIN HCL 1250 MG/250ML IV SOLN: 1250.0000 mg | INTRAVENOUS | Status: DC

## 2024-11-14 MED ORDER — ARFORMOTEROL TARTRATE 15 MCG/2ML IN NEBU
15.0000 ug | INHALATION_SOLUTION | Freq: Two times a day (BID) | RESPIRATORY_TRACT | Status: DC
  Administered 2024-11-14 – 2024-11-20 (×12): 15 ug via RESPIRATORY_TRACT
  Filled 2024-11-14 (×14): qty 2

## 2024-11-14 MED ORDER — DORZOLAMIDE HCL 2 % OP SOLN
1.0000 [drp] | Freq: Two times a day (BID) | OPHTHALMIC | Status: DC
  Administered 2024-11-14 – 2024-12-03 (×38): 1 [drp] via OPHTHALMIC
  Filled 2024-11-14 (×6): qty 10

## 2024-11-14 MED ORDER — PANTOPRAZOLE SODIUM 40 MG PO TBEC
40.0000 mg | DELAYED_RELEASE_TABLET | Freq: Every day | ORAL | Status: DC
  Administered 2024-11-14 – 2024-12-03 (×19): 40 mg via ORAL
  Filled 2024-11-14 (×20): qty 1

## 2024-11-14 MED ORDER — INSULIN ASPART 100 UNIT/ML IJ SOLN
0.0000 [IU] | Freq: Every day | INTRAMUSCULAR | Status: DC
  Administered 2024-11-15 – 2024-11-16 (×2): 2 [IU] via SUBCUTANEOUS
  Filled 2024-11-14 (×2): qty 2

## 2024-11-14 MED ORDER — HEPARIN (PORCINE) 25000 UT/250ML-% IV SOLN
1200.0000 [IU]/h | INTRAVENOUS | Status: DC
  Administered 2024-11-14: 1200 [IU]/h via INTRAVENOUS
  Filled 2024-11-14: qty 250

## 2024-11-14 MED ORDER — FUROSEMIDE 10 MG/ML IJ SOLN
40.0000 mg | Freq: Two times a day (BID) | INTRAMUSCULAR | Status: DC
  Administered 2024-11-14 (×2): 40 mg via INTRAVENOUS
  Filled 2024-11-14 (×2): qty 4

## 2024-11-14 NOTE — ED Notes (Signed)
 Pt to radiology at this time.

## 2024-11-14 NOTE — Assessment & Plan Note (Addendum)
 History of tachybradycardia syndrome Hold Eliquis  while on heparin  Does not appear to be on rate control agents

## 2024-11-14 NOTE — ED Provider Notes (Signed)
 [facility] Provider Note    Event Date/Time   First MD Initiated Contact with Patient 11/14/24 0031     (approximate)   History   Leg Swelling   HPI  Jenny [name] is a 89 y.o. female with history of CAD, COPD, hypertension, hyperlipidemia, atrial fibrillation, dCHF who presents to the emergency department with bilateral leg pain, redness, warmth and wounds to the top of both of her feet.  She denies any injury.  She states this started several days ago.  No fevers or chills.  Denies history of PE or DVT.  She also appears extremely short of breath here.  States this has been ongoing for several months and is unchanged.  She is tachypneic and was hypoxic with EMS requiring 2 L of oxygen via nasal cannula.  Does not wear oxygen chronically.  She denies cough or congestion.  No chest pain.   History provided by patient, EMS.    Past Medical History:  Diagnosis Date   ACUT DUOD ULCER W/HEMORR&PERF W/O MENTION OBST 10/05/2009   NSAID induced   ALLERGIC RHINITIS CAUSE UNSPECIFIED    ANEMIA-NOS    CAD (coronary artery disease) 06/08/2009   DEs RCA with 70% LAD and EF 60%   CHF (congestive heart failure) (HCC)    COPD    mild obst on PFTs 03/2010   Diabetes mellitus 06/2010 dx   Mild, diet controlled   GLAUCOMA    HYPERLIPIDEMIA    HYPERTENSION, BENIGN    MYOCARDIAL INFARCTION 06/08/2009   des to rca   Persistent atrial fibrillation (HCC)    Dx 08/2021   TOBACCO ABUSE     Past Surgical History:  Procedure Laterality Date   CARDIOVERSION N/A 01/30/2022   Procedure: CARDIOVERSION;  Surgeon: Alveta Aleene [name], MD;  Location: [facility] ENDOSCOPY;  Service: Cardiovascular;  Laterality: N/A;   HEMORRHOID SURGERY  1990   Right knee surgery      MEDICATIONS:  Prior to Admission medications   Medication Sig Start Date End Date Taking? Authorizing Provider  acetaminophen  (TYLENOL ) 500 MG tablet Take 1,000 mg by mouth every 6 (six) hours as needed for moderate  pain (pain score 4-6).    [provider]  apixaban  (ELIQUIS ) 2.5 MG TABS tablet Take 1 tablet (2.5 mg total) by mouth 2 (two) times daily. 11/10/23   Lelon Hamilton T, PA-C  dorzolamide  (TRUSOPT ) 2 % ophthalmic solution Place 1 drop into both eyes 2 (two) times daily.    [provider]  furosemide  (LASIX ) 40 MG tablet Take 1 tablet (40 mg total) by mouth every other day. 11/02/24 01/31/25  Samtani, Jai-Gurmukh, MD  gabapentin  (NEURONTIN ) 100 MG capsule Take 1-2 capsules (100-200 mg total) by mouth See admin instructions. Take 1 capsule (100mg ) by mouth every morning and take 2 capsules (200mg ) by mouth at bedtime 11/02/24   Samtani, Jai-Gurmukh, MD  hydrOXYzine  (ATARAX ) 25 MG tablet Take 25 mg by mouth every 8 (eight) hours as needed for itching. 06/06/22   [provider]  ipratropium-albuterol  (DUONEB) 0.5-2.5 (3) MG/3ML SOLN Take 3 mLs by nebulization every 6 (six) hours as needed. 11/06/21   Patel, Sona, MD  latanoprost  (XALATAN ) 0.005 % ophthalmic solution Place 1 drop into both eyes at bedtime.    [provider]  NITROSTAT  0.4 MG SL tablet DISSOLVE ONE TABLET UNDER TONGUE AS NEEDED FOR CHEST PAIN - MAY REPEAT TWICE-IF NO RELIEF GO TO NEAREST HOSPITAL ER 05/06/13   Inocencio Berwyn LABOR, MD  nystatin  (  MYCOSTATIN /NYSTOP ) powder Apply topically 3 (three) times daily. Patient taking differently: Apply 1 Application topically daily as needed (rash). 06/24/24   Ezenduka, Nkeiruka J, MD  pantoprazole  (PROTONIX ) 40 MG tablet Take 1 tablet (40 mg total) by mouth daily. 10/14/23   Lelon Hamilton T, PA-C  potassium chloride  SA (KLOR-CON  M) 20 MEQ tablet Taking 40meq daily 10/21/24   Donette [place] A, FNP  predniSONE  (DELTASONE ) 20 MG tablet Take 1 tablet (20 mg total) by mouth daily. 11/02/24   Samtani, Jai-Gurmukh, MD  repaglinide  (PRANDIN ) 0.5 MG tablet Take 1 tablet (0.5 mg total) by mouth 2 (two) times daily before a meal. 11/02/24   Samtani, Jai-Gurmukh, MD  rosuvastatin   (CRESTOR ) 20 MG tablet Take 20 mg by mouth daily.    [provider]  Tiotropium Bromide-Olodaterol (STIOLTO RESPIMAT) 2.5-2.5 MCG/ACT AERS Inhale 2 puffs into the lungs daily.    [provider]  VENTOLIN  HFA 108 (90 Base) MCG/ACT inhaler Inhale 1 puff into the lungs every 6 (six) hours as needed for shortness of breath or wheezing. 10/12/24   [provider]    Physical Exam   Triage Vital Signs: ED Triage Vitals  Encounter Vitals Group     BP 11/14/24 0013 (!) 151/86     Girls Systolic BP Percentile --      Girls Diastolic BP Percentile --      Boys Systolic BP Percentile --      Boys Diastolic BP Percentile --      Pulse Rate 11/14/24 0013 85     Resp 11/14/24 0013 (!) 24     Temp 11/14/24 0013 98 F (36.7 C)     Temp Source 11/14/24 0013 Oral     SpO2 11/14/24 0013 99 %     Weight --      Height --      Head Circumference --      Peak Flow --      Pain Score 11/14/24 0012 10     Pain Loc --      Pain Education --      Exclude from Growth Chart --     Most recent vital signs: Vitals:   11/14/24 0600 11/14/24 0711  BP: (!) 128/53   Pulse: 63   Resp: 15   Temp: 97.9 F (36.6 C)   SpO2: 100% 100%    CONSTITUTIONAL: Alert, responds appropriately to questions.  Chronically ill in appearance, elderly, increased work of breathing HEAD: Normocephalic, atraumatic EYES: Conjunctivae clear, pupils appear equal, sclera nonicteric ENT: normal nose; moist mucous membranes NECK: Supple, normal ROM CARD: RRR; S1 and S2 appreciated RESP: Patient is tachypneic with increased work of breathing.  Mild respiratory distress.  Hypoxic on room air with EMS.  Diminished aeration diffusely.  No rhonchi or rales.  Mild scattered wheezing. ABD/GI: Non-distended; soft, non-tender, no rebound, no guarding, no peritoneal signs BACK: The back appears normal EXT: Patient has significant erythema and warmth to her distal bilateral lower extremities from about the mid  shin down with sloughing of the superficial layers of skin to her dorsal feet bilaterally.  She does have palpable pulses.  She has mild edema that is symmetric bilaterally without significant calf tenderness or calf swelling.  Her compartments are soft.  No weeping or purulent drainage noted.  No crepitus. SKIN: Normal color for age and race; warm; no rash on exposed skin NEURO: Moves all extremities equally, normal speech PSYCH: The patient's mood and manner are appropriate.  Patient gave verbal permission to utilize photo for medical documentation only. The image was not stored on any personal device.   ED Results / Procedures / Treatments   LABS: (all labs ordered are listed, but only abnormal results are displayed) Labs Reviewed  LACTIC ACID, PLASMA - Abnormal; Notable for the following components:      Result Value   Lactic Acid, Venous 2.1 (*)    All other components within normal limits  COMPREHENSIVE METABOLIC PANEL WITH GFR - Abnormal; Notable for the following components:   Glucose, Bld 127 (*)    BUN 43 (*)    Creatinine, Ser 1.39 (*)    Calcium  8.5 (*)    Total Protein 6.1 (*)    Alkaline Phosphatase 129 (*)    GFR, Estimated 36 (*)    All other components within normal limits  CBC WITH DIFFERENTIAL/PLATELET - Abnormal; Notable for the following components:   WBC 10.7 (*)    RBC 3.80 (*)    Hemoglobin 9.8 (*)    HCT 32.6 (*)    MCH 25.8 (*)    RDW 19.6 (*)    nRBC 1.4 (*)    Neutro Abs 9.4 (*)    Lymphs Abs 0.3 (*)    Abs Immature Granulocytes 0.18 (*)    All other components within normal limits  PRO BRAIN NATRIURETIC PEPTIDE - Abnormal; Notable for the following components:   Pro Brain Natriuretic Peptide 2,277.0 (*)    All other components within normal limits  HEPARIN  LEVEL (UNFRACTIONATED) - Abnormal; Notable for the following components:   Heparin  Unfractionated >1.10 (*)    All other components within normal limits  APTT - Abnormal; Notable for  the following components:   aPTT 41 (*)    All other components within normal limits  PROTIME-INR - Abnormal; Notable for the following components:   Prothrombin Time 16.5 (*)    INR 1.3 (*)    All other components within normal limits  TROPONIN T, HIGH SENSITIVITY - Abnormal; Notable for the following components:   Troponin T High Sensitivity 108 (*)    All other components within normal limits  CULTURE, BLOOD (ROUTINE X 2)  CULTURE, BLOOD (ROUTINE X 2)  RESP PANEL BY RT-PCR (RSV, FLU A&B, COVID)  RVPGX2  LACTIC ACID, PLASMA  APTT     EKG:  EKG Interpretation Date/Time:  Sunday November 14 2024 00:32:18 EST Ventricular Rate:  70 PR Interval:    QRS Duration:  117 QT Interval:  385 QTC Calculation: 416 R Axis:   257  Text Interpretation: Atrial fibrillation Nonspecific IVCD with LAD Anterior infarct, old Nonspecific T abnormalities, lateral leads No significant change since last tracing Confirmed by Neomi Neptune [id number]) on 11/14/2024 12:59:55 AM         RADIOLOGY: My personal review and interpretation of imaging: CTA shows small right-sided PE, pulmonary edema  Ultrasound shows no DVT.  I have personally reviewed all radiology reports.   US  Venous Img Lower Bilateral Result Date: 11/14/2024 EXAM: ULTRASOUND DUPLEX OF THE BILATERAL LOWER EXTREMITY VEINS TECHNIQUE: Duplex ultrasound using B-mode/gray scaled imaging and Doppler spectral analysis and color flow was obtained of the deep venous structures of the bilateral lower extremity. COMPARISON: US  Left Lower Extremity 11/16/2018. CLINICAL HISTORY: leg pain and swelling FINDINGS: LEFT: The common femoral vein, femoral vein, and popliteal vein of the left lower extremity demonstrate normal compressibility with normal color flow and spectral analysis. The calf veins are suboptimally visualized. RIGHT: The common femoral vein, femoral  vein, and popliteal vein of the right lower extremity demonstrate normal compressibility with  normal color flow and spectral analysis. The calf veins are suboptimally visualized. IMPRESSION: 1. No evidence of DVT. Evaluation of the calf veins is suboptimal. Electronically signed by: Evalene Coho MD 11/14/2024 06:03 AM EST RP Workstation: [id number]   CT Angio Chest PE W and/or Wo Contrast Result Date: 11/14/2024 EXAM: CTA of the Chest with contrast for PE 11/14/2024 02:55:15 AM TECHNIQUE: CTA of the chest was performed after the administration of intravenous contrast. Multiplanar reformatted images are provided for review. MIP images are provided for review. Automated exposure control, iterative reconstruction, and/or weight based adjustment of the mA/kV was utilized to reduce the radiation dose to as low as reasonably achievable. COMPARISON: None available. CLINICAL HISTORY: Pulmonary embolism (PE) suspected, high prob; Short of breath, tachypneic, new oxygen requirement, chest x-ray shows pulmonary nodule. FINDINGS: PULMONARY ARTERIES: Pulmonary arteries are adequately opacified for evaluation. Central pulmonary arteries are abnormal in caliber. Acute pulmonary embolus is present, characterized by a tiny single branching intraluminal filling defect within the left lower lobar posterior segmental pulmonary artery. The embolic burden is tiny. MEDIASTINUM: Extensive multivessel coronary artery calcifications. Mild cardiomegaly with asymmetric enlargement of the right heart chambers in keeping with elevated right heart pressure. Relative enlargement of the right ventricle with inversion of the normal RV/LV ratio, unlikely to be the result of the tiny embolic insult and more likely representing a chronic finding. Moderate atherosclerotic calcification within the thoracic aorta. No aortic aneurysm. LYMPH NODES: No mediastinal, hilar or axillary lymphadenopathy. LUNGS AND PLEURA: A 13 mm nodule is seen within the right upper lobe (48/5), indeterminate. Perihilar ground-glass pulmonary infiltrate  asymmetrically involving the left hilum in keeping with mild asymmetric pulmonary edema. No focal consolidation. No pleural effusion or pneumothorax. UPPER ABDOMEN: Limited images of the upper abdomen are unremarkable. SOFT TISSUES AND BONES: Osseous structures are age-appropriate. No acute bone abnormality. No lytic or blastic bone lesion. No acute soft tissue abnormality. The critical findings of this study were discussed directly with Dr. Neomi by myself at 3:32 am. IMPRESSION: 1. Tiny acute pulmonary embolus in the left lower lobar posterior segmental pulmonary artery. No CT evidence of right heart strain attributable to this embolus, with right heart enlargement favored to be chronic. 2. Extensive multivessel coronary artery calcifications and mild cardiomegaly with asymmetric enlargement of the right heart chambers, favored chronic. 3. Mild asymmetric pulmonary edema involving the left hilum. 4. Indeterminate 13 mm right upper lobe pulmonary nodule. Recommend non-contrast chest CT at 3 months, PET/CT, or tissue sampling per Fleischner Society Guidelines. 5. The critical findings of this study were discussed directly with Dr. Neomi by myself at 3:32 am. Electronically signed by: Dorethia Molt MD 11/14/2024 03:33 AM EST RP Workstation: [id number]   DG Chest Portable 1 View Result Date: 11/14/2024 EXAM: 1 VIEW(S) XRAY OF THE CHEST 11/14/2024 01:20:16 AM COMPARISON: 10/28/2024 CLINICAL HISTORY: Shortness of breath. FINDINGS: LUNGS AND PLEURA: 12 mm right upper lobe pulmonary nodule, stable since the prior study. No acute airspace disease. No pleural effusion. No pneumothorax. HEART AND MEDIASTINUM: Cardiomegaly. Aortic atherosclerosis. BONES AND SOFT TISSUES: No acute osseous abnormality. IMPRESSION: 1. No acute cardiopulmonary disease. 2. Stable 12 mm right upper lobe pulmonary nodule. This could be further evaluated with chest CT. Electronically signed by: Franky Crease MD 11/14/2024 01:26 AM EST RP Workstation:  [id number]     PROCEDURES:  Critical Care performed: Yes, see critical care procedure note(s)   CRITICAL CARE Performed by: Josette  Juli Odom   Total critical care time: 30 minutes  Critical care time was exclusive of separately billable procedures and treating other patients.  Critical care was necessary to treat or prevent imminent or life-threatening deterioration.  Critical care was time spent personally by me on the following activities: development of treatment plan with patient and/or surrogate as well as nursing, discussions with consultants, evaluation of patient's response to treatment, examination of patient, obtaining history from patient or surrogate, ordering and performing treatments and interventions, ordering and review of laboratory studies, ordering and review of radiographic studies, pulse oximetry and re-evaluation of patient's condition.   [name]1-3 Lead EKG Interpretation  Performed by: Leaf Kernodle, Josette SAILOR, DO Authorized by: March Joos, Josette SAILOR, DO     Interpretation: normal     ECG rate:  63   ECG rate assessment: normal     Rhythm: sinus rhythm     Ectopy: none     Conduction: normal       IMPRESSION / MDM / ASSESSMENT AND PLAN / ED COURSE  I reviewed the triage vital signs and the nursing notes.    Patient here with bilateral leg swelling, redness and warmth as well as shortness of breath with new oxygen requirement.  The patient is on the cardiac monitor to evaluate for evidence of arrhythmia and/or significant heart rate changes.   DIFFERENTIAL DIAGNOSIS (includes but not limited to):   Cellulitis, DVT, no sign of compartment syndrome, septic arthritis, gout  COPD exacerbation, CHF exacerbation, PE, pneumonia, viral URI, pneumothorax   Patient's presentation is most consistent with acute presentation with potential threat to life or bodily function.   PLAN: Will start patient on broad antibiotics for bilateral lower extremity cellulitis.  She has  palpable pulses in both of her feet.  She appears quite uncomfortable.  Will give her pain medication.  Will obtain labs, cultures.  I anticipate she will need admission.  She is also short of breath here but states this is chronic however has significant increased work of breathing and new onset hypoxia.  Mild scattered wheezes.  Will give breathing treatments, steroids but will also check BNP, D-dimer for concerns for potential CHF versus PE.  Troponins also pending.  EKG nonischemic.   MEDICATIONS GIVEN IN ED: Medications  heparin  ADULT infusion 100 units/mL (25000 units/250mL) (has no administration in time range)  hydrOXYzine  (ATARAX ) tablet 25 mg (has no administration in time range)  pantoprazole  (PROTONIX ) EC tablet 40 mg (has no administration in time range)  gabapentin  (NEURONTIN ) capsule 100 mg (has no administration in time range)  potassium chloride  SA (KLOR-CON  M) CR tablet 40 mEq (has no administration in time range)  dorzolamide  (TRUSOPT ) 2 % ophthalmic solution 1 drop (has no administration in time range)  latanoprost  (XALATAN ) 0.005 % ophthalmic solution 1 drop (has no administration in time range)  acetaminophen  (TYLENOL ) tablet 650 mg (has no administration in time range)    Or  acetaminophen  (TYLENOL ) suppository 650 mg (has no administration in time range)  ondansetron  (ZOFRAN ) tablet 4 mg (has no administration in time range)    Or  ondansetron  (ZOFRAN ) injection 4 mg (has no administration in time range)  cefTRIAXone  (ROCEPHIN ) 1 g in sodium chloride  0.9 % 100 mL IVPB (has no administration in time range)  insulin  aspart (novoLOG ) injection 0-20 Units (has no administration in time range)  insulin  aspart (novoLOG ) injection 0-5 Units (has no administration in time range)  methylPREDNISolone  sodium succinate (SOLU-MEDROL ) 40 mg/mL injection 40 mg (has no administration in  time range)    Followed by  predniSONE  (DELTASONE ) tablet 40 mg (has no administration in time  range)  ipratropium-albuterol  (DUONEB) 0.5-2.5 (3) MG/3ML nebulizer solution 3 mL (has no administration in time range)  albuterol  (PROVENTIL ) (2.5 MG/3ML) 0.083% nebulizer solution 2.5 mg (has no administration in time range)  furosemide  (LASIX ) injection 40 mg (has no administration in time range)  oxyCODONE  (Oxy IR/ROXICODONE ) immediate release tablet 5 mg (has no administration in time range)  morphine  (PF) 2 MG/ML injection 2 mg (has no administration in time range)  guaiFENesin  (MUCINEX ) 12 hr tablet 600 mg (has no administration in time range)  vancomycin  (VANCOREADY) IVPB 1500 mg/300 mL (1,500 mg Intravenous New Bag/Given 11/14/24 0603)  vancomycin  (VANCOREADY) IVPB 1250 mg/250 mL (has no administration in time range)  lactated ringers  bolus 1,000 mL (0 mLs Intravenous Stopped 11/14/24 0231)  cefTRIAXone  (ROCEPHIN ) 2 g in sodium chloride  0.9 % 100 mL IVPB (0 g Intravenous Stopped 11/14/24 0201)  vancomycin  (VANCOCIN ) IVPB 1000 mg/200 mL premix (0 mg Intravenous Stopped 11/14/24 0319)  fentaNYL  (SUBLIMAZE ) injection 50 mcg (50 mcg Intravenous Given 11/14/24 0117)  ondansetron  (ZOFRAN ) injection 4 mg (4 mg Intravenous Given 11/14/24 0123)  ipratropium-albuterol  (DUONEB) 0.5-2.5 (3) MG/3ML nebulizer solution 3 mL (3 mLs Nebulization Given 11/14/24 0209)  lactated ringers  bolus 1,000 mL (0 mLs Intravenous Stopped 11/14/24 0330)  iohexol  (OMNIPAQUE ) 350 MG/ML injection 75 mL (75 mLs Intravenous Contrast Given 11/14/24 0245)  furosemide  (LASIX ) injection 40 mg (40 mg Intravenous Given 11/14/24 0352)  methylPREDNISolone  sodium succinate (SOLU-MEDROL ) 125 mg/2 mL injection 125 mg (125 mg Intravenous Given 11/14/24 0352)     ED COURSE: Patient's labs show leukocytosis of 10.7.  Minimally elevated lactic of 2.1 which is downtrended with IV fluids.  Creatinine of 1.39 which is steadily improving compared to previous.  COVID, flu and RSV negative.  Chest x-ray reviewed and interpreted by myself and the  radiologist and shows no acute abnormality.  BNP elevated at 2277.  First troponin 108.  Patient has allergies listed to aspirin .  Second troponin pending.  Denies any chest pain.  D-dimer is elevated.  Will obtain CTA of the chest.    CTA reviewed and interpreted by myself and the radiologist and shows small right sided segmental PE.  She does have increased right heart pressures but radiologist does not think this is from the small pulmonary embolus and likely more chronic in nature.  She also has pulmonary edema but no infiltrate.  She is getting heparin , Lasix .  Venous Dopplers of bilateral lower extremities obtained and show no DVT.   CONSULTS:  Consulted and discussed patient's case with hospitalist, Dr. Cleatus.  I have recommended admission and consulting physician agrees and will place admission orders.  Patient (and family if present) agree with this plan.   I reviewed all nursing notes, vitals, pertinent previous records.  All labs, EKGs, imaging ordered have been independently reviewed and interpreted by myself.    OUTSIDE RECORDS REVIEWED: Reviewed last cardiology notes.       FINAL CLINICAL IMPRESSION(S) / ED DIAGNOSES   Final diagnoses:  Bilateral lower leg cellulitis  Acute pulmonary edema (HCC)  COPD with acute exacerbation (HCC)  Acute pulmonary embolism without acute cor pulmonale, unspecified pulmonary embolism type (HCC)  Acute respiratory failure with hypoxia (HCC)     Rx / DC Orders   ED Discharge Orders     None        Note:  This document was prepared using Dragon voice recognition  software and may include unintentional dictation errors.   Maisa Bedingfield, Josette SAILOR, DO 11/14/24 [phone]

## 2024-11-14 NOTE — ED Notes (Signed)
 Advised nurse that patient has ready bed

## 2024-11-14 NOTE — Assessment & Plan Note (Signed)
 Sliding scale insulin  coverage

## 2024-11-14 NOTE — ED Notes (Signed)
 Pt called out requesting tylenol . Ward, DO made aware.

## 2024-11-14 NOTE — ED Notes (Signed)
Purwick placed at this time.

## 2024-11-14 NOTE — Assessment & Plan Note (Signed)
 Complicating factor to overall prognosis and care

## 2024-11-14 NOTE — Assessment & Plan Note (Addendum)
 EF 60 to 65% 06/2024 IV Lasix  Continue home GDMT Follows at the heart failure clinic [facility]

## 2024-11-14 NOTE — Progress Notes (Addendum)
 PROGRESS NOTE    Jenny [name]  FMW:[id number] DOB: 05-Sep-1933 DOA: 11/14/2024 PCP: [facility]   Outpatient Specialists: cardiology    Brief Narrative:   From admission h and p   Jenny [name] is a 89 y.o. female with medical history significant for HFpEF, hypertension, persistent A-fib on Eliquis , tachybradycardia syndrome, CAD, COPD, CKD stage 3b, type 2 diabetes, anemia,, being admitted with several acute conditions including lower extremity cellulitis with SIRS, and acute dyspnea secondary to COPD and CHF exacerbation with a segmental PE seen on CTA chest.  She presented to the ED with a 3-day history of lower extremity edema and with right leg pain that started on the night of arrival. In the ED she was tachypneic requiring 2 L to maintain sats in the high 90s labs notable for troponin 108, proBNP 2277 WBC 10.7 with lactic acid 1.3 and negative respiratory viral panel Hemoglobin at baseline at 9.8 Creatinine at baseline at 1.39  EKG showed A-fib at 70 with nonspecific T wave changes CTA chest showed tiny segmental PE left lower lobe without heart strain, extensive multivessel CAD and mild asymmetric pulmonary edema around the left hilum Bilateral lower extremity venous Dopplers ordered from the ED, result pending   Patient was treated with DuoNebs and Solu-Medrol .  She initially received LR boluses but subsequently given a dose of Lasix  as test results came in.  Started on vancomycin  and Rocephin  and also on a heparin  infusion for PE  Assessment & Plan:   Principal Problem:   Lower extremity cellulitis Active Problems:   COPD with acute exacerbation (HCC)   CAD (coronary artery disease)   Acute on chronic heart failure with preserved ejection fraction (HFpEF) (HCC)   Acute pulmonary embolism (HCC)   Essential hypertension   Tachycardia-bradycardia syndrome (HCC)   Permanent atrial fibrillation (HCC)   Stage 3b chronic kidney disease (HCC)   CKD  (chronic kidney disease) stage 4, GFR 15-29 ml/min (HCC)   Type 2 diabetes mellitus with diabetic chronic kidney disease (HCC)   Type II diabetes mellitus with peripheral circulatory disorder (HCC)   Obesity, Class III, BMI 40-49.9 (morbid obesity) (HCC)  # Venous stasis # Cellulitis Worsening erythema and swelling b/l lower extremities with open ulcers over dorsum of feet bilaterally. Chf likely contributing - podiatry consulted, may need debridement - wound care consult - check ABI - will de-escalate abx to cefazolin   # Acute PE Very small. On apixaban  2.5 bid at baseline, patient unsure if she's been receiving regularly at snf. Given cr under 1.5 assumes stays there would warrant full dose of 5 bid - heparin  for now, consider dose escalation of apixaban  when converted to orals  # HFpEF with acute exacerbation Recent hospitalization with dose reduction in diuretic, likely under-diuresed (discharged on EOD lasix  40) - continue lasix  40 IV bid - strict I/os  # COPD  Do not think exacerbated. Arrives on prednisone  20. On 3 liters but satting normally - continue pred 20 will need to confirm chronicity - brovana /incruse for home stiolto - trial off oxygen  # History tachy-brady # A-fib Rate controlled currently - heparin  as above  # Obesity noted  # Elevated troponin Likely demand as no chest pain - f/u second troponin  # Neuropathy - home gabapentin   # CKD 3b Kidney function best it's been in a while, likely consequence of de-escalation of diuretics - monitor  # Pulm nodule - outpt f/u  # Debility Comes from rehab - PT consult  DVT prophylaxis: n/a Code Status: dnr/dni confirmed with patient at bedside Family Communication: son michael telephonically 12/7  Level of care: Progressive Status is: Inpatient Remains inpatient appropriate because: severity of illness    Consultants:  podiatry  Procedures: None thus far  Antimicrobials:  Vanc/ceftriaxone      Subjective:   Reports mild dyspnea  Objective: Vitals:   11/14/24 0300 11/14/24 0330 11/14/24 0600 11/14/24 0711  BP: (!) 110/58 124/76 (!) 128/53   Pulse:  81 63   Resp: 16 20 15    Temp: 97.9 F (36.6 C) 97.9 F (36.6 C) 97.9 F (36.6 C)   TempSrc: Oral Oral Oral   SpO2: 99% 98% 100% 100%  Weight:        Intake/Output Summary (Last 24 hours) at 11/14/2024 0853 Last data filed at 11/14/2024 0805 Gross per 24 hour  Intake 1688.02 ml  Output --  Net 1688.02 ml   Filed Weights   11/14/24 0231  Weight: 100.8 kg    Examination:  General exam: Appears calm and comfortable  Respiratory system: rales at bases Cardiovascular system: S1 & S2 heard, RRR.  Gastrointestinal system: Abdomen is nondistended, soft and nontender. No organomegaly or masses felt. Normal bowel sounds heard. Central nervous system: Alert and oriented. No focal neurological deficits. Extremities:    Psychiatry: Judgement and insight appear normal. Mood & affect appropriate.     Data Reviewed: I have personally reviewed following labs and imaging studies  CBC: Recent Labs  Lab 11/14/24 0022  WBC 10.7*  NEUTROABS 9.4*  HGB 9.8*  HCT 32.6*  MCV 85.8  PLT 239   Basic Metabolic Panel: Recent Labs  Lab 11/14/24 0022  NA 135  K 5.1  CL 98  CO2 25  GLUCOSE 127*  BUN 43*  CREATININE 1.39*  CALCIUM  8.5*   GFR: Estimated Creatinine Clearance: 30.5 mL/min (A) (by C-G formula based on SCr of 1.39 mg/dL (H)). Liver Function Tests: Recent Labs  Lab 11/14/24 0022  AST 17  ALT 20  ALKPHOS 129*  BILITOT 0.3  PROT 6.1*  ALBUMIN 3.7   No results for input(s): LIPASE, AMYLASE in the last 168 hours. No results for input(s): AMMONIA in the last 168 hours. Coagulation Profile: Recent Labs  Lab 11/14/24 0354  INR 1.3*   Cardiac Enzymes: No results for input(s): CKTOTAL, CKMB, CKMBINDEX, TROPONINI in the last 168 hours. BNP (last 3 results) Recent Labs     11/14/24 0022  PROBNP 2,277.0*   HbA1C: No results for input(s): HGBA1C in the last 72 hours. CBG: Recent Labs  Lab 11/14/24 0800  GLUCAP 167*   Lipid Profile: No results for input(s): CHOL, HDL, LDLCALC, TRIG, CHOLHDL, LDLDIRECT in the last 72 hours. Thyroid  Function Tests: No results for input(s): TSH, T4TOTAL, FREET4, T3FREE, THYROIDAB in the last 72 hours. Anemia Panel: No results for input(s): VITAMINB12, FOLATE, FERRITIN, TIBC, IRON , RETICCTPCT in the last 72 hours. Urine analysis:    Component Value Date/Time   COLORURINE YELLOW 10/06/2024 2103   APPEARANCEUR CLEAR 10/06/2024 2103   LABSPEC 1.008 10/06/2024 2103   PHURINE 6.0 10/06/2024 2103   GLUCOSEU NEGATIVE 10/06/2024 2103   HGBUR MODERATE (A) 10/06/2024 2103   BILIRUBINUR NEGATIVE 10/06/2024 2103   KETONESUR NEGATIVE 10/06/2024 2103   PROTEINUR NEGATIVE 10/06/2024 2103   NITRITE NEGATIVE 10/06/2024 2103   LEUKOCYTESUR NEGATIVE 10/06/2024 2103   Sepsis Labs: @LABRCNTIP (procalcitonin:4,lacticidven:4)  ) Recent Results (from the past 240 hours)  Blood culture (routine x 2)     Status: None (Preliminary result)  Collection Time: 11/14/24  1:22 AM   Specimen: BLOOD  Result Value Ref Range Status   Specimen Description BLOOD RIGHT ANTECUBITAL  Final   Special Requests   Final    BOTTLES DRAWN AEROBIC AND ANAEROBIC Blood Culture results may not be optimal due to an inadequate volume of blood received in culture bottles   Culture   Final    NO GROWTH < 12 HOURS Performed at [facility], [address], [place], [place] [address]    Report Status PENDING  Incomplete  Blood culture (routine x 2)     Status: None (Preliminary result)   Collection Time: 11/14/24  1:22 AM   Specimen: BLOOD  Result Value Ref Range Status   Specimen Description BLOOD BLOOD LEFT FOREARM  Final   Special Requests   Final    BOTTLES DRAWN AEROBIC AND ANAEROBIC Blood Culture adequate  volume   Culture   Final    NO GROWTH < 12 HOURS Performed at [facility], [address] [place], [place] [address]    Report Status PENDING  Incomplete  Resp panel by RT-PCR (RSV, Flu A&B, Covid) Anterior Nasal Swab     Status: None   Collection Time: 11/14/24  1:22 AM   Specimen: Anterior Nasal Swab  Result Value Ref Range Status   SARS Coronavirus 2 by RT PCR NEGATIVE NEGATIVE Final    Comment: (NOTE) SARS-CoV-2 target nucleic acids are NOT DETECTED.  The SARS-CoV-2 RNA is generally detectable in upper respiratory specimens during the acute phase of infection. The lowest concentration of SARS-CoV-2 viral copies this assay can detect is 138 copies/mL. A negative result does not preclude SARS-Cov-2 infection and should not be used as the sole basis for treatment or other patient management decisions. A negative result may occur with  improper specimen collection/handling, submission of specimen other than nasopharyngeal swab, presence of viral mutation(s) within the areas targeted by this assay, and inadequate number of viral copies(<138 copies/mL). A negative result must be combined with clinical observations, patient history, and epidemiological information. The expected result is Negative.  Fact Sheet for Patients:  bloggercourse.com  Fact Sheet for Healthcare Providers:  seriousbroker.it  This test is no t yet approved or cleared by the [place]  FDA and  has been authorized for detection and/or diagnosis of SARS-CoV-2 by FDA under an Emergency Use Authorization (EUA). This EUA will remain  in effect (meaning this test can be used) for the duration of the COVID-19 declaration under Section 564(b)(1) of the Act, 21 U.S.C.section 360bbb-3(b)(1), unless the authorization is terminated  or revoked sooner.       Influenza A by PCR NEGATIVE NEGATIVE Final   Influenza B by PCR NEGATIVE NEGATIVE Final     Comment: (NOTE) The Xpert Xpress SARS-CoV-2/FLU/RSV plus assay is intended as an aid in the diagnosis of influenza from Nasopharyngeal swab specimens and should not be used as a sole basis for treatment. Nasal washings and aspirates are unacceptable for Xpert Xpress SARS-CoV-2/FLU/RSV testing.  Fact Sheet for Patients: bloggercourse.com  Fact Sheet for Healthcare Providers: seriousbroker.it  This test is not yet approved or cleared by the [place]  FDA and has been authorized for detection and/or diagnosis of SARS-CoV-2 by FDA under an Emergency Use Authorization (EUA). This EUA will remain in effect (meaning this test can be used) for the duration of the COVID-19 declaration under Section 564(b)(1) of the Act, 21 U.S.C. section 360bbb-3(b)(1), unless the authorization is terminated or revoked.  Resp Syncytial Virus by PCR NEGATIVE NEGATIVE Final    Comment: (NOTE) Fact Sheet for Patients: bloggercourse.com  Fact Sheet for Healthcare Providers: seriousbroker.it  This test is not yet approved or cleared by the [place]  FDA and has been authorized for detection and/or diagnosis of SARS-CoV-2 by FDA under an Emergency Use Authorization (EUA). This EUA will remain in effect (meaning this test can be used) for the duration of the COVID-19 declaration under Section 564(b)(1) of the Act, 21 U.S.C. section 360bbb-3(b)(1), unless the authorization is terminated or revoked.  Performed at [facility], [address] [place], [place] [address]          Radiology Studies: US  Venous Img Lower Bilateral Result Date: 11/14/2024 EXAM: ULTRASOUND DUPLEX OF THE BILATERAL LOWER EXTREMITY VEINS TECHNIQUE: Duplex ultrasound using B-mode/gray scaled imaging and Doppler spectral analysis and color flow was obtained of the deep venous structures of the bilateral lower  extremity. COMPARISON: US  Left Lower Extremity 11/16/2018. CLINICAL HISTORY: leg pain and swelling FINDINGS: LEFT: The common femoral vein, femoral vein, and popliteal vein of the left lower extremity demonstrate normal compressibility with normal color flow and spectral analysis. The calf veins are suboptimally visualized. RIGHT: The common femoral vein, femoral vein, and popliteal vein of the right lower extremity demonstrate normal compressibility with normal color flow and spectral analysis. The calf veins are suboptimally visualized. IMPRESSION: 1. No evidence of DVT. Evaluation of the calf veins is suboptimal. Electronically signed by: Evalene Coho MD 11/14/2024 06:03 AM EST RP Workstation: [id number]   CT Angio Chest PE W and/or Wo Contrast Result Date: 11/14/2024 EXAM: CTA of the Chest with contrast for PE 11/14/2024 02:55:15 AM TECHNIQUE: CTA of the chest was performed after the administration of intravenous contrast. Multiplanar reformatted images are provided for review. MIP images are provided for review. Automated exposure control, iterative reconstruction, and/or weight based adjustment of the mA/kV was utilized to reduce the radiation dose to as low as reasonably achievable. COMPARISON: None available. CLINICAL HISTORY: Pulmonary embolism (PE) suspected, high prob; Short of breath, tachypneic, new oxygen requirement, chest x-ray shows pulmonary nodule. FINDINGS: PULMONARY ARTERIES: Pulmonary arteries are adequately opacified for evaluation. Central pulmonary arteries are abnormal in caliber. Acute pulmonary embolus is present, characterized by a tiny single branching intraluminal filling defect within the left lower lobar posterior segmental pulmonary artery. The embolic burden is tiny. MEDIASTINUM: Extensive multivessel coronary artery calcifications. Mild cardiomegaly with asymmetric enlargement of the right heart chambers in keeping with elevated right heart pressure. Relative enlargement of  the right ventricle with inversion of the normal RV/LV ratio, unlikely to be the result of the tiny embolic insult and more likely representing a chronic finding. Moderate atherosclerotic calcification within the thoracic aorta. No aortic aneurysm. LYMPH NODES: No mediastinal, hilar or axillary lymphadenopathy. LUNGS AND PLEURA: A 13 mm nodule is seen within the right upper lobe (48/5), indeterminate. Perihilar ground-glass pulmonary infiltrate asymmetrically involving the left hilum in keeping with mild asymmetric pulmonary edema. No focal consolidation. No pleural effusion or pneumothorax. UPPER ABDOMEN: Limited images of the upper abdomen are unremarkable. SOFT TISSUES AND BONES: Osseous structures are age-appropriate. No acute bone abnormality. No lytic or blastic bone lesion. No acute soft tissue abnormality. The critical findings of this study were discussed directly with Dr. Neomi by myself at 3:32 am. IMPRESSION: 1. Tiny acute pulmonary embolus in the left lower lobar posterior segmental pulmonary artery. No CT evidence of right heart strain attributable to this embolus, with right heart enlargement favored to  be chronic. 2. Extensive multivessel coronary artery calcifications and mild cardiomegaly with asymmetric enlargement of the right heart chambers, favored chronic. 3. Mild asymmetric pulmonary edema involving the left hilum. 4. Indeterminate 13 mm right upper lobe pulmonary nodule. Recommend non-contrast chest CT at 3 months, PET/CT, or tissue sampling per Fleischner Society Guidelines. 5. The critical findings of this study were discussed directly with Dr. Neomi by myself at 3:32 am. Electronically signed by: Dorethia Molt MD 11/14/2024 03:33 AM EST RP Workstation: [id number]   DG Chest Portable 1 View Result Date: 11/14/2024 EXAM: 1 VIEW(S) XRAY OF THE CHEST 11/14/2024 01:20:16 AM COMPARISON: 10/28/2024 CLINICAL HISTORY: Shortness of breath. FINDINGS: LUNGS AND PLEURA: 12 mm right upper lobe  pulmonary nodule, stable since the prior study. No acute airspace disease. No pleural effusion. No pneumothorax. HEART AND MEDIASTINUM: Cardiomegaly. Aortic atherosclerosis. BONES AND SOFT TISSUES: No acute osseous abnormality. IMPRESSION: 1. No acute cardiopulmonary disease. 2. Stable 12 mm right upper lobe pulmonary nodule. This could be further evaluated with chest CT. Electronically signed by: Franky Crease MD 11/14/2024 01:26 AM EST RP Workstation: [id number]        Scheduled Meds:  dorzolamide   1 drop Both Eyes BID   furosemide   40 mg Intravenous BID   gabapentin   100 mg Oral Daily   guaiFENesin   600 mg Oral BID   insulin  aspart  0-20 Units Subcutaneous TID WC   insulin  aspart  0-5 Units Subcutaneous QHS   ipratropium-albuterol   3 mL Nebulization Q6H   latanoprost   1 drop Both Eyes QHS   methylPREDNISolone  (SOLU-MEDROL ) injection  40 mg Intravenous Q12H   Followed by   NOREEN ON 11/15/2024] predniSONE   40 mg Oral Q breakfast   pantoprazole   40 mg Oral Daily   potassium chloride  SA  40 mEq Oral Daily   Continuous Infusions:  [START ON 11/15/2024] cefTRIAXone  (ROCEPHIN )  IV     heparin      [START ON 11/16/2024] vancomycin        LOS: 0 days     Devaughn KATHEE Ban, MD [facility]   If 7PM-7AM, please contact night-coverage [email or web address] Password [email or web address] 11/14/2024, 8:53 AM

## 2024-11-14 NOTE — Consult Note (Signed)
 PODIATRY CONSULTATION  NAME Jenny [name] MRN [id number] DOB 1933-02-23 DOA 11/14/2024   Reason for consult:  Chief Complaint  Patient presents with   Leg Swelling    Consulting physician: Dr. Devaughn Ban MD, [facility]  History of present illness: 89 y.o. female complicated PMHx admitted for acute lower extremity cellulitis more prevalent to the RLE with ulcers to the dorsal aspect of bilateral feet present for about 4-5 days according to patient.  Past Medical History:  Diagnosis Date   ACUT DUOD ULCER W/HEMORR&PERF W/O MENTION OBST 10/05/2009   NSAID induced   ALLERGIC RHINITIS CAUSE UNSPECIFIED    ANEMIA-NOS    CAD (coronary artery disease) 06/08/2009   DEs RCA with 70% LAD and EF 60%   CHF (congestive heart failure) (HCC)    COPD    mild obst on PFTs 03/2010   Diabetes mellitus 06/2010 dx   Mild, diet controlled   GLAUCOMA    HYPERLIPIDEMIA    HYPERTENSION, BENIGN    MYOCARDIAL INFARCTION 06/08/2009   des to rca   Persistent atrial fibrillation (HCC)    Dx 08/2021   TOBACCO ABUSE        Latest Ref Rng & Units 11/14/2024   12:22 AM 11/02/2024    3:02 AM 11/01/2024    2:39 AM  CBC  WBC 4.0 - 10.5 K/uL 10.7  8.6  10.3   Hemoglobin 12.0 - 15.0 g/dL 9.8  9.1  9.3   Hematocrit 36.0 - 46.0 % 32.6  30.0  30.0   Platelets 150 - 400 K/uL 239  239  256        Latest Ref Rng & Units 11/14/2024   12:22 AM 11/02/2024    3:02 AM 11/01/2024    9:13 AM  BMP  Glucose 70 - 99 mg/dL 872  747  769   BUN 8 - 23 mg/dL 43  59  64   Creatinine 0.44 - 1.00 mg/dL 8.60  8.12  7.80   Sodium 135 - 145 mmol/L 135  136  134   Potassium 3.5 - 5.1 mmol/L 5.1  3.9  3.3   Chloride 98 - 111 mmol/L 98  101  98   CO2 22 - 32 mmol/L 25  24  22    Calcium  8.9 - 10.3 mg/dL 8.5  6.9  6.7     LT leg 11/14/2024  RT leg 11/14/2024  RT foot 11/14/2024  LT foot 11/14/2024   Physical Exam: General: The patient is alert and oriented x3 in no acute distress.   Dermatology:  Superficial/mostly partial-thickness ulcers noted to the dorsal aspect of the bilateral feet.  Please see above noted photos.  They appear stable with scattered intermixed nonviable superficial eschar.  Clinically there is no concern for underlying abscess or deep tissue pathology other than the cellulitis present.  Additional multiple superficial stable wounds noted throughout the lower extremities most with overlying stable scabs/eschar.  Please see above noted photos  Vascular: The feet and toes are warm to touch.  Capillary refill to the toes WNL.  The feet appear to be well-perfused.  Clinically no significant concern for vascular compromise however US  ARTERIAL ABI ordered, pending.   ABIs 01/14/2023 demonstrated biphasic waveforms with normal perfusion to the great toes bilateral.  Neurological: Light touch and protective threshold diminished  Musculoskeletal Exam: No structural deformity noted.  Patient ambulatory    ASSESSMENT/PLAN OF CARE 1.  Bilateral lower extremity edema w/ cellulitis RT > LT 2.  Ulcers B/L dorsal  feet w/ multiple superficial small ulcers bilateral legs; stable  -Patient seen at bedside in ED -Bedside debridement of the ulcers was performed today to remove any loosely adhered eschar and peeling epidermal tissue.  The wounds appear very superficial and stable with good potential for healing. -Dressing changes DAILY (wound orders modified): apply Xeroform to all wounds w/ overlying 4x4 gauze, large kerlex, 4 ace wrap up to the level of the knee.  -Recommend compressive Ace wrap's up to the level of the knee to assist in reduction of the edema present -Cefazolin  1g Q8H as ordered. Rec dc on 7 days oral abx -Full weightbearing as tolerated -From a podiatry standpoint no additional intervention at this time. F/u 1 week post discharge in office for outpatient wound care as well as routine footcare     Thank you for the consult.  Please contact me directly via secure  chat with any questions or concerns.     Thresa EMERSON Sar, DPM [facility]  Dr. Thresa EMERSON Sar, DPM    [address] [place], [place] [address]                Office [phone]  Fax [phone]

## 2024-11-14 NOTE — Progress Notes (Signed)
 PHARMACY - ANTICOAGULATION CONSULT NOTE  Pharmacy Consult for Heparin   Indication: pulmonary embolus  Allergies  Allergen Reactions   Aspirin  Other (See Comments)    Can take 81 mg not 325 mg -bleeding   Entresto  [Sacubitril -Valsartan ] Shortness Of Breath    COPD and reports increased SOB with Entresto    Atorvastatin  Itching and Rash    Itching and myaliga     Cyclobenzaprine      muscle relaxers-ulcer hemorrhage (hospitalized), ulcer hemorrhage   Lactose Intolerance (Gi) Other (See Comments)    GI upset   Meperidine Hcl Other (See Comments)    Not known   Pravastatin  Rash   Propoxyphene     Hallucination, hallucinations, vomiting   Wound Dressing Adhesive Other (See Comments)    red, stings    Patient Measurements: Weight: 100.8 kg (222 lb 3.6 oz)  Vital Signs: Temp: 97.9 F (36.6 C) (12/07 0330) Temp Source: Oral (12/07 0330) BP: 124/76 (12/07 0330) Pulse Rate: 81 (12/07 0330)  Labs: Recent Labs    11/14/24 0022  HGB 9.8*  HCT 32.6*  PLT 239  CREATININE 1.39*    Estimated Creatinine Clearance: 30.5 mL/min (A) (by C-G formula based on SCr of 1.39 mg/dL (H)).   Medical History: Past Medical History:  Diagnosis Date   ACUT DUOD ULCER W/HEMORR&PERF W/O MENTION OBST 10/05/2009   NSAID induced   ALLERGIC RHINITIS CAUSE UNSPECIFIED    ANEMIA-NOS    CAD (coronary artery disease) 06/08/2009   DEs RCA with 70% LAD and EF 60%   CHF (congestive heart failure) (HCC)    COPD    mild obst on PFTs 03/2010   Diabetes mellitus 06/2010 dx   Mild, diet controlled   GLAUCOMA    HYPERLIPIDEMIA    HYPERTENSION, BENIGN    MYOCARDIAL INFARCTION 06/08/2009   des to rca   Persistent atrial fibrillation (HCC)    Dx 08/2021   TOBACCO ABUSE     Medications:  (Not in a hospital admission)   Assessment: Pharmacy consulted to dose heparin  in this 89 year old female admitted with PE.  Pt was on Eliquis  2.5 mg PO BID PTA, last dose on 12/6 @ 2100.  CrCl = 30.5 ml/min    Goal of Therapy:  Heparin  level 0.3-0.7 units/ml aPTT 66 - 102  seconds Monitor platelets by anticoagulation protocol: Yes   Plan:  - Last dose of apixaban  on 12/6 @ 2100 - Will start heparin  infusion on 12/7 @ 0900 at 1200 units/hr, no bolus - Use aPTT to guide dosing until correlating with HL - draw aPTT 8 hrs after start of drip  - CBC daily    Nasim Habeeb D 11/14/2024,3:46 AM

## 2024-11-14 NOTE — Assessment & Plan Note (Signed)
 -  Renal function  at baseline

## 2024-11-14 NOTE — Progress Notes (Signed)
 Pt being followed by [facility] for Sepsis protocol.

## 2024-11-14 NOTE — Progress Notes (Signed)
 CODE SEPSIS - PHARMACY COMMUNICATION  **Broad Spectrum Antibiotics should be administered within 1 hour of Sepsis diagnosis**  Time Code Sepsis Called/Page Received:  12/7 @ 0108   Antibiotics Ordered: Ceftriaxone  , Vancomycin    Time of 1st antibiotic administration: Ceftriaxone  2 gm IV X 1 on 12/7 @ 0133   Additional action taken by pharmacy:   If necessary, Name of Provider/Nurse Contacted:     Itzael Liptak D ,PharmD Clinical Pharmacist  11/14/2024  1:46 AM

## 2024-11-14 NOTE — Assessment & Plan Note (Signed)
 Possible SIRS, lower suspicion for sepsis Patient mildly tachypneic requiring O2 but not febrile or tachycardic, leukocytosis without lactic acidosis Vancomycin  and Rocephin  Keep leg elevated Pain control Follow-up lower extremity venous ultrasound

## 2024-11-14 NOTE — Assessment & Plan Note (Signed)
 DuoNebs every 6 and as needed IV steroids Antitussives Supplemental oxygen as needed

## 2024-11-14 NOTE — H&P (Signed)
 History and Physical    Patient: Jenny [name] FMW:[id number] DOB: Mar 14, 1933 DOA: 11/14/2024 DOS: the patient was seen and examined on 11/14/2024 PCP: [facility]   Patient coming from: Home  Chief Complaint:  Chief Complaint  Patient presents with   Leg Swelling    HPI: Jenny [name] is a 89 y.o. female with medical history significant for HFpEF, hypertension, persistent A-fib on Eliquis , tachybradycardia syndrome, CAD, COPD, CKD stage 3b, type 2 diabetes, anemia,, being admitted with several acute conditions including lower extremity cellulitis with SIRS, and acute dyspnea secondary to COPD and CHF exacerbation with a segmental PE seen on CTA chest.  She presented to the ED with a 3-day history of lower extremity edema and with right leg pain that started on the night of arrival. In the ED she was tachypneic requiring 2 L to maintain sats in the high 90s labs notable for troponin 108, proBNP 2277 WBC 10.7 with lactic acid 1.3 and negative respiratory viral panel Hemoglobin at baseline at 9.8 Creatinine at baseline at 1.39  EKG showed A-fib at 70 with nonspecific T wave changes CTA chest showed tiny segmental PE left lower lobe without heart strain, extensive multivessel CAD and mild asymmetric pulmonary edema around the left hilum Bilateral lower extremity venous Dopplers ordered from the ED, result pending  Patient was treated with DuoNebs and Solu-Medrol .  She initially received LR boluses but subsequently given a dose of Lasix  as test results came in.  Started on vancomycin  and Rocephin  and also on a heparin  infusion for PE Admission requested    Past Medical History:  Diagnosis Date   ACUT DUOD ULCER W/HEMORR&PERF W/O MENTION OBST 10/05/2009   NSAID induced   ALLERGIC RHINITIS CAUSE UNSPECIFIED    ANEMIA-NOS    CAD (coronary artery disease) 06/08/2009   DEs RCA with 70% LAD and EF 60%   CHF (congestive heart failure) (HCC)    COPD    mild obst on  PFTs 03/2010   Diabetes mellitus 06/2010 dx   Mild, diet controlled   GLAUCOMA    HYPERLIPIDEMIA    HYPERTENSION, BENIGN    MYOCARDIAL INFARCTION 06/08/2009   des to rca   Persistent atrial fibrillation (HCC)    Dx 08/2021   TOBACCO ABUSE    Past Surgical History:  Procedure Laterality Date   CARDIOVERSION N/A 01/30/2022   Procedure: CARDIOVERSION;  Surgeon: Alveta Aleene [name], MD;  Location[facility] ENDOSCOPY;  Service: Cardiovascular;  Laterality: N/A;   HEMORRHOID SURGERY  1990   Right knee surgery     Social History:  reports that she quit smoking about 3 years ago. Her smoking use included cigarettes. She started smoking about 63 years ago. She has a 30 pack-year smoking history. She has never used smokeless tobacco. She reports current alcohol use of about 1.0 - 2.0 standard drink of alcohol per week. She reports that she does not use drugs.  Allergies  Allergen Reactions   Aspirin  Other (See Comments)    Can take 81 mg not 325 mg -bleeding   Entresto  [Sacubitril -Valsartan ] Shortness Of Breath    COPD and reports increased SOB with Entresto    Atorvastatin  Itching and Rash    Itching and myaliga     Cyclobenzaprine      muscle relaxers-ulcer hemorrhage (hospitalized), ulcer hemorrhage   Lactose Intolerance (Gi) Other (See Comments)    GI upset   Meperidine Hcl Other (See Comments)    Not known   Pravastatin  Rash  Propoxyphene     Hallucination, hallucinations, vomiting   Wound Dressing Adhesive Other (See Comments)    red, stings    Family History  Problem Relation Age of Onset   Hypertension Mother    Ulcers Mother    Stomach cancer Father    Stroke Maternal Grandmother    Heart attack Neg Hx     Prior to Admission medications   Medication Sig Start Date End Date Taking? Authorizing Provider  acetaminophen  (TYLENOL ) 500 MG tablet Take 1,000 mg by mouth every 12 (twelve) hours.   Yes [provider]  acetaminophen  (TYLENOL ) 500 MG tablet Take 500 mg by  mouth every 6 (six) hours as needed for mild pain (pain score 1-3) or moderate pain (pain score 4-6).   Yes [provider]  apixaban  (ELIQUIS ) 2.5 MG TABS tablet Take 1 tablet (2.5 mg total) by mouth 2 (two) times daily. 11/10/23  Yes Weaver, Scott T, PA-C  dorzolamide  (TRUSOPT ) 2 % ophthalmic solution Place 1 drop into both eyes 2 (two) times daily.   Yes [provider]  furosemide  (LASIX ) 40 MG tablet Take 1 tablet (40 mg total) by mouth every other day. 11/02/24 01/31/25 Yes Samtani, Jai-Gurmukh, MD  gabapentin  (NEURONTIN ) 100 MG capsule Take 1-2 capsules (100-200 mg total) by mouth See admin instructions. Take 1 capsule (100mg ) by mouth every morning and take 2 capsules (200mg ) by mouth at bedtime Patient taking differently: Take 100 mg by mouth daily. 11/02/24  Yes Samtani, Jai-Gurmukh, MD  gabapentin  (NEURONTIN ) 100 MG capsule Take 200 mg by mouth at bedtime.   Yes [provider]  hydrOXYzine  (ATARAX ) 25 MG tablet Take 25 mg by mouth every 8 (eight) hours as needed for itching. 06/06/22  Yes [provider]  ipratropium-albuterol  (DUONEB) 0.5-2.5 (3) MG/3ML SOLN Take 3 mLs by nebulization every 6 (six) hours as needed. 11/06/21  Yes Patel, Sona, MD  latanoprost  (XALATAN ) 0.005 % ophthalmic solution Place 1 drop into both eyes at bedtime.   Yes [provider]  NITROSTAT  0.4 MG SL tablet DISSOLVE ONE TABLET UNDER TONGUE AS NEEDED FOR CHEST PAIN - MAY REPEAT TWICE-IF NO RELIEF GO TO NEAREST HOSPITAL ER 05/06/13  Yes Inocencio Berwyn LABOR, MD  nystatin  (MYCOSTATIN /NYSTOP ) powder Apply topically 3 (three) times daily. Patient taking differently: Apply 1 Application topically 3 (three) times daily. 06/24/24  Yes Ezenduka, Nkeiruka J, MD  pantoprazole  (PROTONIX ) 40 MG tablet Take 1 tablet (40 mg total) by mouth daily. 10/14/23  Yes Lelon Hamilton T, PA-C  potassium chloride  SA (KLOR-CON  M) 20 MEQ tablet Taking 40meq daily 10/21/24  Yes [place] A, FNP   predniSONE  (DELTASONE ) 20 MG tablet Take 1 tablet (20 mg total) by mouth daily. 11/02/24  Yes Samtani, Jai-Gurmukh, MD  Tiotropium Bromide-Olodaterol (STIOLTO RESPIMAT) 2.5-2.5 MCG/ACT AERS Inhale 2 puffs into the lungs daily.   Yes [provider]  VENTOLIN  HFA 108 (90 Base) MCG/ACT inhaler Inhale 1 puff into the lungs every 6 (six) hours as needed for shortness of breath or wheezing. 10/12/24  Yes [provider]  repaglinide  (PRANDIN ) 0.5 MG tablet Take 1 tablet (0.5 mg total) by mouth 2 (two) times daily before a meal. Patient not taking: Reported on 11/14/2024 11/02/24   Samtani, Jai-Gurmukh, MD  rosuvastatin  (CRESTOR ) 20 MG tablet Take 20 mg by mouth daily.    [provider]    Physical Exam: Vitals:   11/14/24 0013 11/14/24 0231 11/14/24 0300 11/14/24 0330  BP: (!) 151/86  (!) 110/58 124/76  Pulse: 85   81  Resp: (!) 24  16 20   Temp: 98 F (36.7 C)  97.9 F (36.6 C) 97.9 F (36.6 C)  TempSrc: Oral  Oral Oral  SpO2: 99%  99% 98%  Weight:  100.8 kg     Physical Exam Vitals and nursing note reviewed.  Constitutional:      General: She is not in acute distress. HENT:     Head: Normocephalic and atraumatic.  Cardiovascular:     Rate and Rhythm: Normal rate and regular rhythm.     Heart sounds: Normal heart sounds.  Pulmonary:     Effort: Pulmonary effort is normal.     Breath sounds: Normal breath sounds.  Abdominal:     Palpations: Abdomen is soft.     Tenderness: There is no abdominal tenderness.  Musculoskeletal:     Comments: See photos below  Neurological:     Mental Status: Mental status is at baseline.        Labs on Admission: I have personally reviewed following labs and imaging studies  CBC: Recent Labs  Lab 11/14/24 0022  WBC 10.7*  NEUTROABS 9.4*  HGB 9.8*  HCT 32.6*  MCV 85.8  PLT 239   Basic Metabolic Panel: Recent Labs  Lab 11/14/24 0022  NA 135  K 5.1  CL 98  CO2 25  GLUCOSE 127*  BUN 43*  CREATININE  1.39*  CALCIUM  8.5*   GFR: Estimated Creatinine Clearance: 30.5 mL/min (A) (by C-G formula based on SCr of 1.39 mg/dL (H)). Liver Function Tests: Recent Labs  Lab 11/14/24 0022  AST 17  ALT 20  ALKPHOS 129*  BILITOT 0.3  PROT 6.1*  ALBUMIN 3.7   No results for input(s): LIPASE, AMYLASE in the last 168 hours. No results for input(s): AMMONIA in the last 168 hours. Coagulation Profile: Recent Labs  Lab 11/14/24 0354  INR 1.3*   Cardiac Enzymes: No results for input(s): CKTOTAL, CKMB, CKMBINDEX, TROPONINI in the last 168 hours. BNP (last 3 results) Recent Labs    11/14/24 0022  PROBNP 2,277.0*   HbA1C: No results for input(s): HGBA1C in the last 72 hours. CBG: No results for input(s): GLUCAP in the last 168 hours. Lipid Profile: No results for input(s): CHOL, HDL, LDLCALC, TRIG, CHOLHDL, LDLDIRECT in the last 72 hours. Thyroid  Function Tests: No results for input(s): TSH, T4TOTAL, FREET4, T3FREE, THYROIDAB in the last 72 hours. Anemia Panel: No results for input(s): VITAMINB12, FOLATE, FERRITIN, TIBC, IRON , RETICCTPCT in the last 72 hours. Urine analysis:    Component Value Date/Time   COLORURINE YELLOW 10/06/2024 2103   APPEARANCEUR CLEAR 10/06/2024 2103   LABSPEC 1.008 10/06/2024 2103   PHURINE 6.0 10/06/2024 2103   GLUCOSEU NEGATIVE 10/06/2024 2103   HGBUR MODERATE (A) 10/06/2024 2103   BILIRUBINUR NEGATIVE 10/06/2024 2103   KETONESUR NEGATIVE 10/06/2024 2103   PROTEINUR NEGATIVE 10/06/2024 2103   NITRITE NEGATIVE 10/06/2024 2103   LEUKOCYTESUR NEGATIVE 10/06/2024 2103    Radiological Exams on Admission: CT Angio Chest PE W and/or Wo Contrast Result Date: 11/14/2024 EXAM: CTA of the Chest with contrast for PE 11/14/2024 02:55:15 AM TECHNIQUE: CTA of the chest was performed after the administration of intravenous contrast. Multiplanar reformatted images are provided for review. MIP images are provided for  review. Automated exposure control, iterative reconstruction, and/or weight based adjustment of the mA/kV was utilized to reduce the radiation dose to as low as reasonably achievable. COMPARISON: None available. CLINICAL HISTORY: Pulmonary embolism (PE) suspected, high prob; Short  of breath, tachypneic, new oxygen requirement, chest x-ray shows pulmonary nodule. FINDINGS: PULMONARY ARTERIES: Pulmonary arteries are adequately opacified for evaluation. Central pulmonary arteries are abnormal in caliber. Acute pulmonary embolus is present, characterized by a tiny single branching intraluminal filling defect within the left lower lobar posterior segmental pulmonary artery. The embolic burden is tiny. MEDIASTINUM: Extensive multivessel coronary artery calcifications. Mild cardiomegaly with asymmetric enlargement of the right heart chambers in keeping with elevated right heart pressure. Relative enlargement of the right ventricle with inversion of the normal RV/LV ratio, unlikely to be the result of the tiny embolic insult and more likely representing a chronic finding. Moderate atherosclerotic calcification within the thoracic aorta. No aortic aneurysm. LYMPH NODES: No mediastinal, hilar or axillary lymphadenopathy. LUNGS AND PLEURA: A 13 mm nodule is seen within the right upper lobe (48/5), indeterminate. Perihilar ground-glass pulmonary infiltrate asymmetrically involving the left hilum in keeping with mild asymmetric pulmonary edema. No focal consolidation. No pleural effusion or pneumothorax. UPPER ABDOMEN: Limited images of the upper abdomen are unremarkable. SOFT TISSUES AND BONES: Osseous structures are age-appropriate. No acute bone abnormality. No lytic or blastic bone lesion. No acute soft tissue abnormality. The critical findings of this study were discussed directly with Dr. Neomi by myself at 3:32 am. IMPRESSION: 1. Tiny acute pulmonary embolus in the left lower lobar posterior segmental pulmonary artery. No  CT evidence of right heart strain attributable to this embolus, with right heart enlargement favored to be chronic. 2. Extensive multivessel coronary artery calcifications and mild cardiomegaly with asymmetric enlargement of the right heart chambers, favored chronic. 3. Mild asymmetric pulmonary edema involving the left hilum. 4. Indeterminate 13 mm right upper lobe pulmonary nodule. Recommend non-contrast chest CT at 3 months, PET/CT, or tissue sampling per Fleischner Society Guidelines. 5. The critical findings of this study were discussed directly with Dr. Neomi by myself at 3:32 am. Electronically signed by: Dorethia Molt MD 11/14/2024 03:33 AM EST RP Workstation: [id number]   DG Chest Portable 1 View Result Date: 11/14/2024 EXAM: 1 VIEW(S) XRAY OF THE CHEST 11/14/2024 01:20:16 AM COMPARISON: 10/28/2024 CLINICAL HISTORY: Shortness of breath. FINDINGS: LUNGS AND PLEURA: 12 mm right upper lobe pulmonary nodule, stable since the prior study. No acute airspace disease. No pleural effusion. No pneumothorax. HEART AND MEDIASTINUM: Cardiomegaly. Aortic atherosclerosis. BONES AND SOFT TISSUES: No acute osseous abnormality. IMPRESSION: 1. No acute cardiopulmonary disease. 2. Stable 12 mm right upper lobe pulmonary nodule. This could be further evaluated with chest CT. Electronically signed by: Franky Crease MD 11/14/2024 01:26 AM EST RP Workstation: [id number]   Data Reviewed for HPI: Relevant notes from primary care and specialist visits, past discharge summaries as available in EHR, including Care Everywhere. Prior diagnostic testing as pertinent to current admission diagnoses Updated medications and problem lists for reconciliation ED course, including vitals, labs, imaging, treatment and response to treatment Triage notes, nursing and pharmacy notes and ED provider's notes Notable results as noted above in HPI      Assessment and Plan: * Lower extremity cellulitis Possible SIRS, lower suspicion for  sepsis Patient mildly tachypneic requiring O2 but not febrile or tachycardic, leukocytosis without lactic acidosis Vancomycin  and Rocephin  Keep leg elevated Pain control Follow-up lower extremity venous ultrasound  COPD with acute exacerbation (HCC) DuoNebs every 6 and as needed IV steroids Antitussives Supplemental oxygen as needed  Acute pulmonary embolism (HCC) Tiny segmental PE Started on heparin  but patient already on Eliquis  Will continue heparin  for now but suspect can rapidly transition back to Eliquis  unless  failure of therapy consider Follow-up lower extremity venous ultrasound  Acute on chronic heart failure with preserved ejection fraction (HFpEF) (HCC) EF 60 to 65% 06/2024 IV Lasix  Continue home GDMT Follows at the heart failure clinic [facility]  Permanent atrial fibrillation (HCC) History of tachybradycardia syndrome Hold Eliquis  while on heparin  Does not appear to be on rate control agents  Tachycardia-bradycardia syndrome (HCC) No acute issues  Stage 3b chronic kidney disease (HCC) Renal function at baseline  Obesity, Class III, BMI 40-49.9 (morbid obesity) (HCC) Complicating factor to overall prognosis and care  Type II diabetes mellitus with peripheral circulatory disorder (HCC) Sliding scale insulin  coverage    DVT prophylaxis: heparin   Consults: none  Advance Care Planning:   Code Status: Prior   Family Communication: none  Disposition Plan: Back to previous home environment  Severity of Illness: The appropriate patient status for this patient is INPATIENT. Inpatient status is judged to be reasonable and necessary in order to provide the required intensity of service to ensure the patient's safety. The patient's presenting symptoms, physical exam findings, and initial radiographic and laboratory data in the context of their chronic comorbidities is felt to place them at high risk for further clinical deterioration. Furthermore, it is not  anticipated that the patient will be medically stable for discharge from the hospital within 2 midnights of admission.   * I certify that at the point of admission it is my clinical judgment that the patient will require inpatient hospital care spanning beyond 2 midnights from the point of admission due to high intensity of service, high risk for further deterioration and high frequency of surveillance required.*  Author: Delayne LULLA Solian, MD 11/14/2024 4:41 AM  For on call review [email or web address].

## 2024-11-14 NOTE — Progress Notes (Signed)
 Pharmacy Antibiotic Note  Jenny [name] is a 89 y.o. female admitted on 11/14/2024 with cellulitis.  Pharmacy has been consulted for Vancomycin  dosing.  Plan: Vancomycin  1 gm IV X 1 given in ED 12/7 @ 0205.  Additional Vanc 1500 mg IV X 1 ordered to make total loading dose of 2500 mg.  Vancomycin  1250 mg IV Q48H ordered to start on 12/9 @ 0200.  AUC = 553.3 Vanc trough = 13.3   Weight: 100.8 kg (222 lb 3.6 oz)  Temp (24hrs), Avg:97.9 F (36.6 C), Min:97.9 F (36.6 C), Max:98 F (36.7 C)  Recent Labs  Lab 11/14/24 0022 11/14/24 0122  WBC 10.7*  --   CREATININE 1.39*  --   LATICACIDVEN 2.1* 1.3    Estimated Creatinine Clearance: 30.5 mL/min (A) (by C-G formula based on SCr of 1.39 mg/dL (H)).    Allergies  Allergen Reactions   Aspirin  Other (See Comments)    Can take 81 mg not 325 mg -bleeding   Entresto  [Sacubitril -Valsartan ] Shortness Of Breath    COPD and reports increased SOB with Entresto    Atorvastatin  Itching and Rash    Itching and myaliga     Cyclobenzaprine      muscle relaxers-ulcer hemorrhage (hospitalized), ulcer hemorrhage   Lactose Intolerance (Gi) Other (See Comments)    GI upset   Meperidine Hcl Other (See Comments)    Not known   Pravastatin  Rash   Propoxyphene     Hallucination, hallucinations, vomiting   Wound Dressing Adhesive Other (See Comments)    red, stings    Antimicrobials this admission:   >>    >>   Dose adjustments this admission:   Microbiology results:  BCx:   UCx:    Sputum:    MRSA PCR:   Thank you for allowing pharmacy to be a part of this patient's care.  Nikcole Eischeid D 11/14/2024 5:22 AM

## 2024-11-14 NOTE — ED Notes (Signed)
 Pt reported to provider she is not typically on oxygen. Pt acuity increased.

## 2024-11-14 NOTE — Consult Note (Addendum)
 WOC Nurse Consult Note: Reason for Consult: B lower extremity wounds  Wound type: Full thickness B dorsal foot wounds ? Vascular insufficiency  Pressure Injury POA: Na not pressure  Measurement: see nursing flowsheet  Wound bed: red moist with sloughing skin? Initial bullae formation that has ruptured  Drainage (amount, consistency, odor) see nursing flowsheet  Periwound: erythema, dark dusky appearing feet/toes  Dressing procedure/placement/frequency: Cleanse B  dorsal foot wounds with NS, apply Xeroform gauze Soila [phone]) to wound beds daily, cover with ABD pad and secure with Kerlix roll gauze.    Will write for Eucerin to B lower legs as appear dry with scattered dry nonviable tissue.  No open wounds noted to lower legs, if any wounds present may cover with Xeroform gauze and secure with silicone foam.   POC discussed with bedside nurse. Foot wounds have been consulted to podiatry, any wound care orders placed by podiatrist supercede wound care orders placed by [facility] RN.   WOC team will not follow. Reconsult if further needs arise.   Thank you,    Powell Bar MSN, RN-BC, [employer]

## 2024-11-14 NOTE — Assessment & Plan Note (Signed)
 No acute issues.

## 2024-11-14 NOTE — Progress Notes (Signed)
 PHARMACY - ANTICOAGULATION CONSULT NOTE  Pharmacy Consult for Heparin   Indication: pulmonary embolus  Allergies  Allergen Reactions   Aspirin  Other (See Comments)    Can take 81 mg not 325 mg -bleeding   Entresto  [Sacubitril -Valsartan ] Shortness Of Breath    COPD and reports increased SOB with Entresto    Atorvastatin  Itching and Rash    Itching and myaliga     Cyclobenzaprine      muscle relaxers-ulcer hemorrhage (hospitalized), ulcer hemorrhage   Lactose Intolerance (Gi) Other (See Comments)    GI upset   Meperidine Hcl Other (See Comments)    Not known   Pravastatin  Rash   Propoxyphene     Hallucination, hallucinations, vomiting   Wound Dressing Adhesive Other (See Comments)    red, stings    Patient Measurements: Weight: 100.8 kg (222 lb 3.6 oz)  Vital Signs: Temp: 97.6 F (36.4 C) (12/07 1513) Temp Source: Oral (12/07 1513) BP: 112/60 (12/07 1506) Pulse Rate: 68 (12/07 1506)  Labs: Recent Labs    11/14/24 0022 11/14/24 0354  HGB 9.8*  --   HCT 32.6*  --   PLT 239  --   APTT  --  41*  LABPROT  --  16.5*  INR  --  1.3*  HEPARINUNFRC  --  >1.10*  CREATININE 1.39*  --     Estimated Creatinine Clearance: 30.5 mL/min (A) (by C-G formula based on SCr of 1.39 mg/dL (H)).   Medical History: Past Medical History:  Diagnosis Date   ACUT DUOD ULCER W/HEMORR&PERF W/O MENTION OBST 10/05/2009   NSAID induced   ALLERGIC RHINITIS CAUSE UNSPECIFIED    ANEMIA-NOS    CAD (coronary artery disease) 06/08/2009   DEs RCA with 70% LAD and EF 60%   CHF (congestive heart failure) (HCC)    COPD    mild obst on PFTs 03/2010   Diabetes mellitus 06/2010 dx   Mild, diet controlled   GLAUCOMA    HYPERLIPIDEMIA    HYPERTENSION, BENIGN    MYOCARDIAL INFARCTION 06/08/2009   des to rca   Persistent atrial fibrillation (HCC)    Dx 08/2021   TOBACCO ABUSE     Medications:  (Not in a hospital admission)   Assessment: Pharmacy consulted to dose heparin  in this 89 year  old female admitted with PE.  Pt was on Eliquis  2.5 mg PO BID PTA, last dose on 12/6 @ 2100.  Called lab at 19:41 and critical value of 195 for aPTT. Will use 75 kg for heparin  dosing weight.   Goal of Therapy:  Heparin  level 0.3-0.7 units/ml aPTT 66 - 102  seconds Monitor platelets by anticoagulation protocol: Yes   Plan:  Will hold heparin  for 1 hour and restart heparin  at 1000 units/hr. Recheck aPTT level in 8 hours. Recheck heparin  level and CBC with AM labs.   Cathaleen GORMAN Blanch, PharmD, BCPS 11/14/2024,7:41 PM

## 2024-11-14 NOTE — ED Triage Notes (Signed)
 Pt BIB [facility] from [facility] d/t concern for bilateral lower leg pain worsening over the past 3 days. Reports worsening pain to RLE that started tonight. Pt is on lasix . Baseline 2 L [place]. Denies Cp/SOB.  Increased work of breathing - pt sts is normal.

## 2024-11-14 NOTE — Assessment & Plan Note (Addendum)
 Tiny segmental PE Started on heparin  but patient already on Eliquis  Will continue heparin  for now but suspect can rapidly transition back to Eliquis  unless failure of therapy consider Follow-up lower extremity venous ultrasound

## 2024-11-15 ENCOUNTER — Encounter: Payer: Self-pay | Admitting: Internal Medicine

## 2024-11-15 DIAGNOSIS — I739 Peripheral vascular disease, unspecified: Secondary | ICD-10-CM

## 2024-11-15 DIAGNOSIS — Z79899 Other long term (current) drug therapy: Secondary | ICD-10-CM

## 2024-11-15 DIAGNOSIS — R609 Edema, unspecified: Secondary | ICD-10-CM

## 2024-11-15 DIAGNOSIS — M79604 Pain in right leg: Secondary | ICD-10-CM

## 2024-11-15 DIAGNOSIS — Z7901 Long term (current) use of anticoagulants: Secondary | ICD-10-CM

## 2024-11-15 DIAGNOSIS — Z87891 Personal history of nicotine dependence: Secondary | ICD-10-CM

## 2024-11-15 LAB — BASIC METABOLIC PANEL WITH GFR
Anion gap: 13 (ref 5–15)
Anion gap: 16 — ABNORMAL HIGH (ref 5–15)
BUN: 48 mg/dL — ABNORMAL HIGH (ref 8–23)
BUN: 50 mg/dL — ABNORMAL HIGH (ref 8–23)
CO2: 24 mmol/L (ref 22–32)
CO2: 22 mmol/L (ref 22–32)
Calcium: 8.7 mg/dL — ABNORMAL LOW (ref 8.9–10.3)
Calcium: 8.9 mg/dL (ref 8.9–10.3)
Chloride: 96 mmol/L — ABNORMAL LOW (ref 98–111)
Chloride: 95 mmol/L — ABNORMAL LOW (ref 98–111)
Creatinine, Ser: 2 mg/dL — ABNORMAL HIGH (ref 0.44–1.00)
Creatinine, Ser: 2.09 mg/dL — ABNORMAL HIGH (ref 0.44–1.00)
GFR, Estimated: 23 mL/min — ABNORMAL LOW (ref >60)
GFR, Estimated: 22 mL/min — ABNORMAL LOW (ref >60)
Glucose, Bld: 174 mg/dL — ABNORMAL HIGH (ref 70–99)
Glucose, Bld: 184 mg/dL — ABNORMAL HIGH (ref 70–99)
Potassium: 5.3 mmol/L — ABNORMAL HIGH (ref 3.5–5.1)
Potassium: 5.4 mmol/L — ABNORMAL HIGH (ref 3.5–5.1)
Sodium: 134 mmol/L — ABNORMAL LOW (ref 135–145)
Sodium: 133 mmol/L — ABNORMAL LOW (ref 135–145)

## 2024-11-15 LAB — CBC
HCT: 28.9 % — ABNORMAL LOW (ref 36.0–46.0)
Hemoglobin: 8.7 g/dL — ABNORMAL LOW (ref 12.0–15.0)
MCH: 25.5 pg — ABNORMAL LOW (ref 26.0–34.0)
MCHC: 30.1 g/dL (ref 30.0–36.0)
MCV: 84.8 fL (ref 80.0–100.0)
Platelets: 209 K/uL (ref 150–400)
RBC: 3.41 MIL/uL — ABNORMAL LOW (ref 3.87–5.11)
RDW: 19.9 % — ABNORMAL HIGH (ref 11.5–15.5)
WBC: 11.8 K/uL — ABNORMAL HIGH (ref 4.0–10.5)
nRBC: 1 % — ABNORMAL HIGH (ref 0.0–0.2)

## 2024-11-15 LAB — GLUCOSE, CAPILLARY
Glucose-Capillary: 165 mg/dL — ABNORMAL HIGH (ref 70–99)
Glucose-Capillary: 188 mg/dL — ABNORMAL HIGH (ref 70–99)
Glucose-Capillary: 187 mg/dL — ABNORMAL HIGH (ref 70–99)
Glucose-Capillary: 205 mg/dL — ABNORMAL HIGH (ref 70–99)

## 2024-11-15 LAB — HEPARIN LEVEL (UNFRACTIONATED): Heparin Unfractionated: >1.1 [IU]/mL — ABNORMAL HIGH (ref 0.30–0.70)

## 2024-11-15 LAB — APTT
aPTT: 97 s — ABNORMAL HIGH (ref 24–36)
aPTT: 109 s — ABNORMAL HIGH (ref 24–36)

## 2024-11-15 MED ORDER — SODIUM ZIRCONIUM CYCLOSILICATE 10 G PO PACK
10.0000 g | PACK | Freq: Once | ORAL | Status: AC
  Administered 2024-11-15: 10 g via ORAL
  Filled 2024-11-15: qty 1

## 2024-11-15 NOTE — Progress Notes (Signed)
 PT Cancellation Note  Patient Details Name: Jenny [name] MRN: [id number] DOB: Aug 27, 1933   Cancelled Treatment:    Reason Eval/Treat Not Completed: Medical issues which prohibited therapy Consult for Heparin  Indication: pulmonary embolus; PT 24 - 48 hour hold    Jarvis Sawa A Husayn Reim 11/15/2024, 2:30 PM

## 2024-11-15 NOTE — Progress Notes (Signed)
 PROGRESS NOTE    Jenny [name]  FMW:[id number] DOB: Jul 10, 1933 DOA: 11/14/2024 PCP: [facility]   Outpatient Specialists: cardiology    Brief Narrative:   From admission h and p   Jenny [name] is a 89 y.o. female with medical history significant for HFpEF, hypertension, persistent A-fib on Eliquis , tachybradycardia syndrome, CAD, COPD, CKD stage 3b, type 2 diabetes, anemia,, being admitted with several acute conditions including lower extremity cellulitis with SIRS, and acute dyspnea secondary to COPD and CHF exacerbation with a segmental PE seen on CTA chest.  She presented to the ED with a 3-day history of lower extremity edema and with right leg pain that started on the night of arrival. In the ED she was tachypneic requiring 2 L to maintain sats in the high 90s labs notable for troponin 108, proBNP 2277 WBC 10.7 with lactic acid 1.3 and negative respiratory viral panel Hemoglobin at baseline at 9.8 Creatinine at baseline at 1.39  EKG showed A-fib at 70 with nonspecific T wave changes CTA chest showed tiny segmental PE left lower lobe without heart strain, extensive multivessel CAD and mild asymmetric pulmonary edema around the left hilum Bilateral lower extremity venous Dopplers ordered from the ED, result pending   Patient was treated with DuoNebs and Solu-Medrol .  She initially received LR boluses but subsequently given a dose of Lasix  as test results came in.  Started on vancomycin  and Rocephin  and also on a heparin  infusion for PE  Assessment & Plan:   Principal Problem:   Lower extremity cellulitis Active Problems:   COPD with acute exacerbation (HCC)   CAD (coronary artery disease)   Acute on chronic heart failure with preserved ejection fraction (HFpEF) (HCC)   Acute pulmonary embolism (HCC)   Essential hypertension   Tachycardia-bradycardia syndrome (HCC)   Permanent atrial fibrillation (HCC)   Stage 3b chronic kidney disease (HCC)   CKD  (chronic kidney disease) stage 4, GFR 15-29 ml/min (HCC)   Type 2 diabetes mellitus with diabetic chronic kidney disease (HCC)   Type II diabetes mellitus with peripheral circulatory disorder (HCC)   Obesity, Class III, BMI 40-49.9 (morbid obesity) (HCC)   Sepsis (HCC)  # Venous stasis # Cellulitis # PAD? Worsening erythema and swelling b/l lower extremities with open ulcers over dorsum of feet bilaterally. Chf likely contributing. Arterial dopplers show at least moderate PAD. Podiatry debrided ulcers at bedside 12/7 and placed wound care instructions - continue wound care - vascular to see - continue cefazolin   # Acute PE Very small. On apixaban  2.5 bid at baseline, patient unsure if she's been receiving regularly at snf. Given cr under 1.5 assumes stays there would warrant full dose of 5 bid - heparin  for now pending possible vascular intervention  # HFpEF with acute exacerbation Recent hospitalization with dose reduction in diuretic, likely under-diuresed (discharged on EOD lasix  40). Cr up today and k 5.3 - hold additional diuresis for now  # Hyperkalemia K 5.3 today with aki - hold supplement and further lasix  - repeat bmp this pm  # AKI Cr 2 today from 1.39 yesterday likely 2/2 diuresis - pause further diuresis today  # COPD  Do not think exacerbated. Arrives on prednisone  20. On 3 liters but satting normally on room air now - continue pred 20 will need to confirm chronicity - brovana /incruse for home stiolto  # History tachy-brady # A-fib Rate controlled currently - heparin  as above  # Obesity noted  # Elevated troponin Mild, stable,  flat, likely demand as no chest pain.   # Neuropathy - home gabapentin   # CKD 3b Kidney function at baseline, labile - monitor  # Pulm nodule - outpt f/u  # Debility Comes from rehab - PT consulted - patient does not want to return to [facility]. TOC aware, looking elsewhere  DVT prophylaxis: n/a Code Status:  dnr/dni confirmed with patient at bedside 12/7 Family Communication: son michael telephonically 12/8  Level of care: Telemetry Status is: Inpatient Remains inpatient appropriate because: severity of illness    Consultants:  podiatry  Procedures: Bedside debridement podiatry 12/7  Antimicrobials:  Vanc/ceftriaxone  > cefazolin    Subjective:   Reports mild dyspnea stable  Objective: Vitals:   11/15/24 0412 11/15/24 0600 11/15/24 0834 11/15/24 1220  BP: 117/61  (!) 108/58 (!) 142/80  Pulse: 69  79 83  Resp: 19  20 20   Temp: 98.2 F (36.8 C)  97.7 F (36.5 C) 97.6 F (36.4 C)  TempSrc:      SpO2: 98% 96% 97% 99%  Weight:      Height:        Intake/Output Summary (Last 24 hours) at 11/15/2024 1309 Last data filed at 11/15/2024 0957 Gross per 24 hour  Intake 895.21 ml  Output 500 ml  Net 395.21 ml   Filed Weights   11/14/24 0231 11/14/24 2010 11/15/24 0409  Weight: 100.8 kg 92 kg 93.8 kg    Examination:  General exam: Appears calm and comfortable  Respiratory system: rales at bases Cardiovascular system: S1 & S2 heard, RRR.  Gastrointestinal system: Abdomen is nondistended, soft and nontender. No organomegaly or masses felt. Normal bowel sounds heard. Central nervous system: Alert and oriented. No focal neurological deficits. Extremities:    Psychiatry: Judgement and insight appear normal. Mood & affect appropriate.     Data Reviewed: I have personally reviewed following labs and imaging studies  CBC: Recent Labs  Lab 11/14/24 0022 11/15/24 0509  WBC 10.7* 11.8*  NEUTROABS 9.4*  --   HGB 9.8* 8.7*  HCT 32.6* 28.9*  MCV 85.8 84.8  PLT 239 209   Basic Metabolic Panel: Recent Labs  Lab 11/14/24 0022 11/15/24 0509  NA 135 134*  K 5.1 5.3*  CL 98 96*  CO2 25 24  GLUCOSE 127* 174*  BUN 43* 48*  CREATININE 1.39* 2.00*  CALCIUM  8.5* 8.7*   GFR: Estimated Creatinine Clearance: 20.4 mL/min (A) (by C-G formula based on SCr of 2 mg/dL  (H)). Liver Function Tests: Recent Labs  Lab 11/14/24 0022  AST 17  ALT 20  ALKPHOS 129*  BILITOT 0.3  PROT 6.1*  ALBUMIN 3.7   No results for input(s): LIPASE, AMYLASE in the last 168 hours. No results for input(s): AMMONIA in the last 168 hours. Coagulation Profile: Recent Labs  Lab 11/14/24 0354  INR 1.3*   Cardiac Enzymes: No results for input(s): CKTOTAL, CKMB, CKMBINDEX, TROPONINI in the last 168 hours. BNP (last 3 results) Recent Labs    11/14/24 0022  PROBNP 2,277.0*   HbA1C: No results for input(s): HGBA1C in the last 72 hours. CBG: Recent Labs  Lab 11/14/24 1120 11/14/24 1744 11/14/24 2129 11/15/24 0833 11/15/24 1221  GLUCAP 283* 163* 148* 165* 188*   Lipid Profile: No results for input(s): CHOL, HDL, LDLCALC, TRIG, CHOLHDL, LDLDIRECT in the last 72 hours. Thyroid  Function Tests: No results for input(s): TSH, T4TOTAL, FREET4, T3FREE, THYROIDAB in the last 72 hours. Anemia Panel: No results for input(s): VITAMINB12, FOLATE, FERRITIN, TIBC, IRON , RETICCTPCT in  the last 72 hours. Urine analysis:    Component Value Date/Time   COLORURINE YELLOW 10/06/2024 2103   APPEARANCEUR CLEAR 10/06/2024 2103   LABSPEC 1.008 10/06/2024 2103   PHURINE 6.0 10/06/2024 2103   GLUCOSEU NEGATIVE 10/06/2024 2103   HGBUR MODERATE (A) 10/06/2024 2103   BILIRUBINUR NEGATIVE 10/06/2024 2103   KETONESUR NEGATIVE 10/06/2024 2103   PROTEINUR NEGATIVE 10/06/2024 2103   NITRITE NEGATIVE 10/06/2024 2103   LEUKOCYTESUR NEGATIVE 10/06/2024 2103   Sepsis Labs: @LABRCNTIP (procalcitonin:4,lacticidven:4)  ) Recent Results (from the past 240 hours)  Blood culture (routine x 2)     Status: None (Preliminary result)   Collection Time: 11/14/24  1:22 AM   Specimen: BLOOD  Result Value Ref Range Status   Specimen Description BLOOD RIGHT ANTECUBITAL  Final   Special Requests   Final    BOTTLES DRAWN AEROBIC AND ANAEROBIC Blood  Culture results may not be optimal due to an inadequate volume of blood received in culture bottles   Culture   Final    NO GROWTH 1 DAY Performed at [facility], [address] [place], [place] [address]    Report Status PENDING  Incomplete  Blood culture (routine x 2)     Status: None (Preliminary result)   Collection Time: 11/14/24  1:22 AM   Specimen: BLOOD  Result Value Ref Range Status   Specimen Description BLOOD BLOOD LEFT FOREARM  Final   Special Requests   Final    BOTTLES DRAWN AEROBIC AND ANAEROBIC Blood Culture adequate volume   Culture   Final    NO GROWTH 1 DAY Performed at [facility], [address] [place], [place] [address]    Report Status PENDING  Incomplete  Resp panel by RT-PCR (RSV, Flu A&B, Covid) Anterior Nasal Swab     Status: None   Collection Time: 11/14/24  1:22 AM   Specimen: Anterior Nasal Swab  Result Value Ref Range Status   SARS Coronavirus 2 by RT PCR NEGATIVE NEGATIVE Final    Comment: (NOTE) SARS-CoV-2 target nucleic acids are NOT DETECTED.  The SARS-CoV-2 RNA is generally detectable in upper respiratory specimens during the acute phase of infection. The lowest concentration of SARS-CoV-2 viral copies this assay can detect is 138 copies/mL. A negative result does not preclude SARS-Cov-2 infection and should not be used as the sole basis for treatment or other patient management decisions. A negative result may occur with  improper specimen collection/handling, submission of specimen other than nasopharyngeal swab, presence of viral mutation(s) within the areas targeted by this assay, and inadequate number of viral copies(<138 copies/mL). A negative result must be combined with clinical observations, patient history, and epidemiological information. The expected result is Negative.  Fact Sheet for Patients:  bloggercourse.com  Fact Sheet for Healthcare Providers:   seriousbroker.it  This test is no t yet approved or cleared by the [place]  FDA and  has been authorized for detection and/or diagnosis of SARS-CoV-2 by FDA under an Emergency Use Authorization (EUA). This EUA will remain  in effect (meaning this test can be used) for the duration of the COVID-19 declaration under Section 564(b)(1) of the Act, 21 U.S.C.section 360bbb-3(b)(1), unless the authorization is terminated  or revoked sooner.       Influenza A by PCR NEGATIVE NEGATIVE Final   Influenza B by PCR NEGATIVE NEGATIVE Final    Comment: (NOTE) The Xpert Xpress SARS-CoV-2/FLU/RSV plus assay is intended as an aid in the diagnosis of influenza from Nasopharyngeal swab specimens  and should not be used as a sole basis for treatment. Nasal washings and aspirates are unacceptable for Xpert Xpress SARS-CoV-2/FLU/RSV testing.  Fact Sheet for Patients: bloggercourse.com  Fact Sheet for Healthcare Providers: seriousbroker.it  This test is not yet approved or cleared by the [place]  FDA and has been authorized for detection and/or diagnosis of SARS-CoV-2 by FDA under an Emergency Use Authorization (EUA). This EUA will remain in effect (meaning this test can be used) for the duration of the COVID-19 declaration under Section 564(b)(1) of the Act, 21 U.S.C. section 360bbb-3(b)(1), unless the authorization is terminated or revoked.     Resp Syncytial Virus by PCR NEGATIVE NEGATIVE Final    Comment: (NOTE) Fact Sheet for Patients: bloggercourse.com  Fact Sheet for Healthcare Providers: seriousbroker.it  This test is not yet approved or cleared by the [place]  FDA and has been authorized for detection and/or diagnosis of SARS-CoV-2 by FDA under an Emergency Use Authorization (EUA). This EUA will remain in effect (meaning this test can be used) for  the duration of the COVID-19 declaration under Section 564(b)(1) of the Act, 21 U.S.C. section 360bbb-3(b)(1), unless the authorization is terminated or revoked.  Performed at [facility], [address] [place], [place] [address]          Radiology Studies: US  ARTERIAL ABI (SCREENING LOWER EXTREMITY) Result Date: 11/14/2024 CLINICAL DATA:  Tobacco abuse, hypertension, claudication, diabetes, hyperlipidemia EXAM: NONINVASIVE PHYSIOLOGIC VASCULAR STUDY OF BILATERAL LOWER EXTREMITIES TECHNIQUE: Evaluation of both lower extremities were performed at rest, including calculation of ankle-brachial indices with single level Doppler, pressure and pulse volume recording. COMPARISON:  08/23/2021 FINDINGS: Right ABI:  0.7 Left ABI:  0.8 Right Lower Extremity:  Abnormal monophasic arterial waveforms. Left Lower Extremity:  Abnormal monophasic arterial waveforms. 0.5-0.79 Moderate PAD IMPRESSION: 1. Abnormal ABI and arterial waveforms within the bilateral lower extremities, consistent with moderate underlying peripheral arterial disease. Electronically Signed   By: Ozell Daring M.D.   On: 11/14/2024 18:00   US  Venous Img Lower Bilateral Result Date: 11/14/2024 EXAM: ULTRASOUND DUPLEX OF THE BILATERAL LOWER EXTREMITY VEINS TECHNIQUE: Duplex ultrasound using B-mode/gray scaled imaging and Doppler spectral analysis and color flow was obtained of the deep venous structures of the bilateral lower extremity. COMPARISON: US  Left Lower Extremity 11/16/2018. CLINICAL HISTORY: leg pain and swelling FINDINGS: LEFT: The common femoral vein, femoral vein, and popliteal vein of the left lower extremity demonstrate normal compressibility with normal color flow and spectral analysis. The calf veins are suboptimally visualized. RIGHT: The common femoral vein, femoral vein, and popliteal vein of the right lower extremity demonstrate normal compressibility with normal color flow and spectral analysis. The calf  veins are suboptimally visualized. IMPRESSION: 1. No evidence of DVT. Evaluation of the calf veins is suboptimal. Electronically signed by: Evalene Coho MD 11/14/2024 06:03 AM EST RP Workstation: [id number]   CT Angio Chest PE W and/or Wo Contrast Result Date: 11/14/2024 EXAM: CTA of the Chest with contrast for PE 11/14/2024 02:55:15 AM TECHNIQUE: CTA of the chest was performed after the administration of intravenous contrast. Multiplanar reformatted images are provided for review. MIP images are provided for review. Automated exposure control, iterative reconstruction, and/or weight based adjustment of the mA/kV was utilized to reduce the radiation dose to as low as reasonably achievable. COMPARISON: None available. CLINICAL HISTORY: Pulmonary embolism (PE) suspected, high prob; Short of breath, tachypneic, new oxygen requirement, chest x-ray shows pulmonary nodule. FINDINGS: PULMONARY ARTERIES: Pulmonary arteries are adequately opacified for evaluation. Central pulmonary arteries are  abnormal in caliber. Acute pulmonary embolus is present, characterized by a tiny single branching intraluminal filling defect within the left lower lobar posterior segmental pulmonary artery. The embolic burden is tiny. MEDIASTINUM: Extensive multivessel coronary artery calcifications. Mild cardiomegaly with asymmetric enlargement of the right heart chambers in keeping with elevated right heart pressure. Relative enlargement of the right ventricle with inversion of the normal RV/LV ratio, unlikely to be the result of the tiny embolic insult and more likely representing a chronic finding. Moderate atherosclerotic calcification within the thoracic aorta. No aortic aneurysm. LYMPH NODES: No mediastinal, hilar or axillary lymphadenopathy. LUNGS AND PLEURA: A 13 mm nodule is seen within the right upper lobe (48/5), indeterminate. Perihilar ground-glass pulmonary infiltrate asymmetrically involving the left hilum in keeping with mild  asymmetric pulmonary edema. No focal consolidation. No pleural effusion or pneumothorax. UPPER ABDOMEN: Limited images of the upper abdomen are unremarkable. SOFT TISSUES AND BONES: Osseous structures are age-appropriate. No acute bone abnormality. No lytic or blastic bone lesion. No acute soft tissue abnormality. The critical findings of this study were discussed directly with Dr. Neomi by myself at 3:32 am. IMPRESSION: 1. Tiny acute pulmonary embolus in the left lower lobar posterior segmental pulmonary artery. No CT evidence of right heart strain attributable to this embolus, with right heart enlargement favored to be chronic. 2. Extensive multivessel coronary artery calcifications and mild cardiomegaly with asymmetric enlargement of the right heart chambers, favored chronic. 3. Mild asymmetric pulmonary edema involving the left hilum. 4. Indeterminate 13 mm right upper lobe pulmonary nodule. Recommend non-contrast chest CT at 3 months, PET/CT, or tissue sampling per Fleischner Society Guidelines. 5. The critical findings of this study were discussed directly with Dr. Neomi by myself at 3:32 am. Electronically signed by: Dorethia Molt MD 11/14/2024 03:33 AM EST RP Workstation: [id number]   DG Chest Portable 1 View Result Date: 11/14/2024 EXAM: 1 VIEW(S) XRAY OF THE CHEST 11/14/2024 01:20:16 AM COMPARISON: 10/28/2024 CLINICAL HISTORY: Shortness of breath. FINDINGS: LUNGS AND PLEURA: 12 mm right upper lobe pulmonary nodule, stable since the prior study. No acute airspace disease. No pleural effusion. No pneumothorax. HEART AND MEDIASTINUM: Cardiomegaly. Aortic atherosclerosis. BONES AND SOFT TISSUES: No acute osseous abnormality. IMPRESSION: 1. No acute cardiopulmonary disease. 2. Stable 12 mm right upper lobe pulmonary nodule. This could be further evaluated with chest CT. Electronically signed by: Franky Crease MD 11/14/2024 01:26 AM EST RP Workstation: [id number]        Scheduled Meds:  arformoterol   15  mcg Nebulization BID   And   umeclidinium bromide   1 puff Inhalation Daily   dorzolamide   1 drop Both Eyes BID   gabapentin   100 mg Oral Daily   guaiFENesin   600 mg Oral BID   hydrocerin   Topical BID   insulin  aspart  0-20 Units Subcutaneous TID WC   insulin  aspart  0-5 Units Subcutaneous QHS   ipratropium-albuterol   3 mL Nebulization Q6H   latanoprost   1 drop Both Eyes QHS   pantoprazole   40 mg Oral Daily   predniSONE   20 mg Oral Q breakfast   rosuvastatin   20 mg Oral Daily   Continuous Infusions:   ceFAZolin  (ANCEF ) IV 1 g (11/15/24 0535)   heparin  1,000 Units/hr (11/14/24 2128)     LOS: 1 day     Devaughn KATHEE Ban, MD [facility]   If 7PM-7AM, please contact night-coverage [email or web address] Password [email or web address] 11/15/2024, 1:09 PM

## 2024-11-15 NOTE — Plan of Care (Signed)

## 2024-11-15 NOTE — Progress Notes (Signed)
 Heart Failure Navigator Progress Note Current Advanced Heart Failure Team patient of Ellouise Class, FNP. Patient has a previously scheduled Heart Failure appointment on 12/21/24.  Patient would like to keep that scheduled appointment at this time instead of an earlier one.  She knows to notify the clinic of any CHF worsening signs /symptoms or any concerns. Navigator will sign off at this time.  Charmaine Pines, RN, BSN [facility] Heart Failure Navigator Secure Chat Only

## 2024-11-15 NOTE — Consult Note (Signed)
 Hospital Consult    Reason for Consult:  Bilateral lower extremity feet edema and blisters.  Requesting Physician:  Dr Devaughn Ban MD  MRN #:  [id number]  History of Present Illness: This is a 89 y.o. female with medical history significant for HFpEF, hypertension, persistent A-fib on Eliquis , tachybradycardia syndrome, CAD, COPD, CKD stage 3b, type 2 diabetes, anemia,, being admitted with several acute conditions including lower extremity cellulitis with SIRS, and acute dyspnea secondary to COPD and CHF exacerbation with a segmental PE seen on CTA chest.  She presented to the ED with a 3-day history of lower extremity edema and with right leg pain that started on the night of arrival.   In the ED she was tachypneic requiring 2 L to maintain sats in the high 90s labs notable for troponin 108, proBNP 2277. EKG showed A-fib at 70 with nonspecific T wave changes. CTA chest showed tiny segmental PE left lower lobe without heart strain, extensive multivessel CAD and mild asymmetric pulmonary edema around the left hilum.  Patient also underwent bilateral lower extremity arterial duplex ultrasounds and ABIs.  Patient had decreased ABIs with monophasic waveforms in both lower extremities.  Right lower extremity was worse than the left.  Vascular surgery was consulted to evaluate bilateral lower extremities.  Past Medical History:  Diagnosis Date   ACUT DUOD ULCER W/HEMORR&PERF W/O MENTION OBST 10/05/2009   NSAID induced   ALLERGIC RHINITIS CAUSE UNSPECIFIED    ANEMIA-NOS    CAD (coronary artery disease) 06/08/2009   DEs RCA with 70% LAD and EF 60%   CHF (congestive heart failure) (HCC)    COPD    mild obst on PFTs 03/2010   Diabetes mellitus 06/2010 dx   Mild, diet controlled   GLAUCOMA    HYPERLIPIDEMIA    HYPERTENSION, BENIGN    MYOCARDIAL INFARCTION 06/08/2009   des to rca   Persistent atrial fibrillation (HCC)    Dx 08/2021   TOBACCO ABUSE     Past Surgical History:  Procedure Laterality  Date   CARDIOVERSION N/A 01/30/2022   Procedure: CARDIOVERSION;  Surgeon: Alveta Aleene PARAS, MD;  Location: [facility];  Service: Cardiovascular;  Laterality: N/A;   HEMORRHOID SURGERY  1990   Right knee surgery      Allergies  Allergen Reactions   Aspirin  Other (See Comments)    Can take 81 mg not 325 mg -bleeding   Entresto  [Sacubitril -Valsartan ] Shortness Of Breath    COPD and reports increased SOB with Entresto    Atorvastatin  Itching and Rash    Itching and myaliga     Cyclobenzaprine      muscle relaxers-ulcer hemorrhage (hospitalized), ulcer hemorrhage   Lactose Intolerance (Gi) Other (See Comments)    GI upset   Meperidine Hcl Other (See Comments)    Not known   Pravastatin  Rash   Propoxyphene     Hallucination, hallucinations, vomiting   Wound Dressing Adhesive Other (See Comments)    red, stings    Prior to Admission medications   Medication Sig Start Date End Date Taking? Authorizing Provider  acetaminophen  (TYLENOL ) 500 MG tablet Take 1,000 mg by mouth every 12 (twelve) hours.   Yes [provider]  acetaminophen  (TYLENOL ) 500 MG tablet Take 500 mg by mouth every 6 (six) hours as needed for mild pain (pain score 1-3) or moderate pain (pain score 4-6).   Yes [provider]  apixaban  (ELIQUIS ) 2.5 MG TABS tablet Take 1 tablet (2.5 mg total) by mouth 2 (two) times daily.  11/10/23  Yes Weaver, Scott T, PA-C  dorzolamide  (TRUSOPT ) 2 % ophthalmic solution Place 1 drop into both eyes 2 (two) times daily.   Yes [provider]  furosemide  (LASIX ) 40 MG tablet Take 1 tablet (40 mg total) by mouth every other day. 11/02/24 01/31/25 Yes Samtani, Jai-Gurmukh, MD  gabapentin  (NEURONTIN ) 100 MG capsule Take 1-2 capsules (100-200 mg total) by mouth See admin instructions. Take 1 capsule (100mg ) by mouth every morning and take 2 capsules (200mg ) by mouth at bedtime Patient taking differently: Take 100 mg by mouth daily. 11/02/24  Yes Samtani, Jai-Gurmukh,  MD  gabapentin  (NEURONTIN ) 100 MG capsule Take 200 mg by mouth at bedtime.   Yes [provider]  hydrOXYzine  (ATARAX ) 25 MG tablet Take 25 mg by mouth every 8 (eight) hours as needed for itching. 06/06/22  Yes [provider]  ipratropium-albuterol  (DUONEB) 0.5-2.5 (3) MG/3ML SOLN Take 3 mLs by nebulization every 6 (six) hours as needed. 11/06/21  Yes Patel, Sona, MD  latanoprost  (XALATAN ) 0.005 % ophthalmic solution Place 1 drop into both eyes at bedtime.   Yes [provider]  NITROSTAT  0.4 MG SL tablet DISSOLVE ONE TABLET UNDER TONGUE AS NEEDED FOR CHEST PAIN - MAY REPEAT TWICE-IF NO RELIEF GO TO NEAREST HOSPITAL ER 05/06/13  Yes Inocencio Berwyn LABOR, MD  nystatin  (MYCOSTATIN /NYSTOP ) powder Apply topically 3 (three) times daily. Patient taking differently: Apply 1 Application topically 3 (three) times daily. 06/24/24  Yes Ezenduka, Nkeiruka J, MD  pantoprazole  (PROTONIX ) 40 MG tablet Take 1 tablet (40 mg total) by mouth daily. 10/14/23  Yes Weaver, Scott T, PA-C  potassium chloride  SA (KLOR-CON  M) 20 MEQ tablet Taking 40meq daily 10/21/24  Yes [place] A, FNP  predniSONE  (DELTASONE ) 20 MG tablet Take 1 tablet (20 mg total) by mouth daily. 11/02/24  Yes Samtani, Jai-Gurmukh, MD  Tiotropium Bromide-Olodaterol (STIOLTO RESPIMAT) 2.5-2.5 MCG/ACT AERS Inhale 2 puffs into the lungs daily.   Yes [provider]  VENTOLIN  HFA 108 (90 Base) MCG/ACT inhaler Inhale 1 puff into the lungs every 6 (six) hours as needed for shortness of breath or wheezing. 10/12/24  Yes [provider]  repaglinide  (PRANDIN ) 0.5 MG tablet Take 1 tablet (0.5 mg total) by mouth 2 (two) times daily before a meal. Patient not taking: Reported on 11/14/2024 11/02/24   Samtani, Jai-Gurmukh, MD  rosuvastatin  (CRESTOR ) 20 MG tablet Take 20 mg by mouth daily.    [provider]    Social History   Socioeconomic History   Marital status: Widowed    Spouse name: Not on file    Number of children: Not on file   Years of education: Not on file   Highest education level: Not on file  Occupational History   Not on file  Tobacco Use   Smoking status: Former    Current packs/day: 0.00    Average packs/day: 0.5 packs/day for 60.0 years (30.0 ttl pk-yrs)    Types: Cigarettes    Start date: 11/12/1961    Quit date: 11/12/2021    Years since quitting: 3.0   Smokeless tobacco: Never   Tobacco comments:    She lives in [place] with her significant other (Charles Hook)0  Vaping Use   Vaping status: Never Used  Substance and Sexual Activity   Alcohol use: Yes    Alcohol/week: 1.0 - 2.0 standard drink of alcohol    Types: 1 - 2 Glasses of wine per week    Comment: once or twice a year glass  of wine 12/25/21   Drug use: No   Sexual activity: Not on file  Other Topics Concern   Not on file  Social History Narrative   Lives in [place] with her SO - charles hook   Retired -acupuncturist   Enjoys travel - live engineer, maintenance (it)   Social Drivers of Corporate Investment Banker Strain: Low Risk  (07/02/2024)   Overall Financial Resource Strain (CARDIA)    Difficulty of Paying Living Expenses: Not very hard  Food Insecurity: No Food Insecurity (11/15/2024)   Hunger Vital Sign    Worried About Running Out of Food in the Last Year: Never true    Ran Out of Food in the Last Year: Never true  Transportation Needs: No Transportation Needs (11/15/2024)   PRAPARE - Administrator, Civil Service (Medical): No    Lack of Transportation (Non-Medical): No  Physical Activity: Not on file  Stress: Not on file  Social Connections: Socially Isolated (11/15/2024)   Social Connection and Isolation Panel    Frequency of Communication with Friends and Family: More than three times a week    Frequency of Social Gatherings with Friends and Family: Twice a week    Attends Religious Services: Never    Database Administrator or Organizations: No    Attends Tax Inspector Meetings: Never    Marital Status: Widowed  Intimate Partner Violence: Not At Risk (11/15/2024)   Humiliation, Afraid, Rape, and Kick questionnaire    Fear of Current or Ex-Partner: No    Emotionally Abused: No    Physically Abused: No    Sexually Abused: No     Family History  Problem Relation Age of Onset   Hypertension Mother    Ulcers Mother    Stomach cancer Father    Stroke Maternal Grandmother    Heart attack Neg Hx     ROS: Otherwise negative unless mentioned in HPI  Physical Examination  Vitals:   11/15/24 0412 11/15/24 0600  BP: 117/61   Pulse: 69   Resp: 19   Temp: 98.2 F (36.8 C)   SpO2: 98% 96%   Body mass index is 37.82 kg/m.  General:  WDWN in NAD Gait: Not observed HENT: WNL, normocephalic Pulmonary: normal non-labored breathing, without Rales, rhonchi,  wheezing Cardiac: Irregular found to be in Atrial Fibrillation, without  Murmurs, rubs or gallops; without carotid bruits Abdomen: Positive bowel sounds throughout, soft, NT/ND, no masses Skin: without rashes Vascular Exam/Pulses: Upper extremities warm with palpable pulses. Bilateral lower extremities warm with dressings intact and doppler pulses only.  Extremities: with ischemic changes, without Gangrene , with cellulitis; with open wounds; Skin Blisters to bilateral lower feet.  Musculoskeletal: no muscle wasting or atrophy  Neurologic: A&O X 3;  No focal weakness or paresthesias are detected; speech is fluent/normal Psychiatric:  The pt has Normal affect. Lymph:  Unremarkable  CBC    Component Value Date/Time   WBC 11.8 (H) 11/15/2024 0509   RBC 3.41 (L) 11/15/2024 0509   HGB 8.7 (L) 11/15/2024 0509   HCT 28.9 (L) 11/15/2024 0509   PLT 209 11/15/2024 0509   MCV 84.8 11/15/2024 0509   MCH 25.5 (L) 11/15/2024 0509   MCHC 30.1 11/15/2024 0509   RDW 19.9 (H) 11/15/2024 0509   LYMPHSABS 0.3 (L) 11/14/2024 0022   MONOABS 0.7 11/14/2024 0022   EOSABS 0.0 11/14/2024 0022    BASOSABS 0.0 11/14/2024 0022    BMET    Component Value  Date/Time   NA 134 (L) 11/15/2024 0509   NA 139 10/21/2024 1046   K 5.3 (H) 11/15/2024 0509   CL 96 (L) 11/15/2024 0509   CO2 24 11/15/2024 0509   GLUCOSE 174 (H) 11/15/2024 0509   BUN 48 (H) 11/15/2024 0509   BUN 48 (H) 10/21/2024 1046   CREATININE 2.00 (H) 11/15/2024 0509   CALCIUM  8.7 (L) 11/15/2024 0509   GFRNONAA 23 (L) 11/15/2024 0509   GFRAA  10/09/2009 0350    >60        The eGFR has been calculated using the MDRD equation. This calculation has not been validated in all clinical situations. eGFR's persistently <60 mL/min signify possible Chronic Kidney Disease.    COAGS: Lab Results  Component Value Date   INR 1.3 (H) 11/14/2024   INR 1.3 (H) 09/26/2023   INR 1.07 10/06/2009     Non-Invasive Vascular Imaging:  EXAM: CTA of the Chest with contrast for PE 11/14/2024 02:55:15 AM   TECHNIQUE: CTA of the chest was performed after the administration of intravenous contrast. Multiplanar reformatted images are provided for review. MIP images are provided for review. Automated exposure control, iterative reconstruction, and/or weight based adjustment of the mA/kV was utilized to reduce the radiation dose to as low as reasonably achievable.   COMPARISON: None available.   CLINICAL HISTORY: Pulmonary embolism (PE) suspected, high prob; Short of breath, tachypneic, new oxygen requirement, chest x-ray shows pulmonary nodule.   FINDINGS:   PULMONARY ARTERIES: Pulmonary arteries are adequately opacified for evaluation. Central pulmonary arteries are abnormal in caliber. Acute pulmonary embolus is present, characterized by a tiny single branching intraluminal filling defect within the left lower lobar posterior segmental pulmonary artery. The embolic burden is tiny.   MEDIASTINUM: Extensive multivessel coronary artery calcifications. Mild cardiomegaly with asymmetric enlargement of the right heart  chambers in keeping with elevated right heart pressure. Relative enlargement of the right ventricle with inversion of the normal RV/LV ratio, unlikely to be the result of the tiny embolic insult and more likely representing a chronic finding. Moderate atherosclerotic calcification within the thoracic aorta. No aortic aneurysm.   LYMPH NODES: No mediastinal, hilar or axillary lymphadenopathy.   LUNGS AND PLEURA: A 13 mm nodule is seen within the right upper lobe (48/5), indeterminate. Perihilar ground-glass pulmonary infiltrate asymmetrically involving the left hilum in keeping with mild asymmetric pulmonary edema. No focal consolidation. No pleural effusion or pneumothorax.   UPPER ABDOMEN: Limited images of the upper abdomen are unremarkable.   SOFT TISSUES AND BONES: Osseous structures are age-appropriate. No acute bone abnormality. No lytic or blastic bone lesion. No acute soft tissue abnormality.   The critical findings of this study were discussed directly with Dr. Neomi by myself at 3:32 am.   IMPRESSION: 1. Tiny acute pulmonary embolus in the left lower lobar posterior segmental pulmonary artery. No CT evidence of right heart strain attributable to this embolus, with right heart enlargement favored to be chronic. 2. Extensive multivessel coronary artery calcifications and mild cardiomegaly with asymmetric enlargement of the right heart chambers, favored chronic. 3. Mild asymmetric pulmonary edema involving the left hilum. 4. Indeterminate 13 mm right upper lobe pulmonary nodule. Recommend non-contrast chest CT at 3 months, PET/CT, or tissue sampling per Fleischner Society Guidelines. 5. The critical findings of this study were discussed directly with Dr. Neomi by myself at 3:32 am.    EXAM: NONINVASIVE PHYSIOLOGIC VASCULAR STUDY OF BILATERAL LOWER EXTREMITIES   TECHNIQUE: Evaluation of both lower extremities were performed at  rest, including calculation of  ankle-brachial indices with single level Doppler, pressure and pulse volume recording.   COMPARISON:  08/23/2021   FINDINGS: Right ABI:  0.7   Left ABI:  0.8   Right Lower Extremity:  Abnormal monophasic arterial waveforms.   Left Lower Extremity:  Abnormal monophasic arterial waveforms.   0.5-0.79 Moderate PAD   IMPRESSION: 1. Abnormal ABI and arterial waveforms within the bilateral lower extremities, consistent with moderate underlying peripheral arterial disease.  Statin:  Yes.   Beta Blocker:  No. Aspirin :  No. ACEI:  No. ARB:  No. CCB use:  No Other antiplatelets/anticoagulants:  Yes.   Eliquis  2.5 mg BID   ASSESSMENT/PLAN: This is a 89 y.o. female who currently lives in a rehab facility and states that she felt like she was not being taken care of due to multiple reasons.  Patient is being admitted for multiple acute conditions including lower extremity cellulitis with SIRS, acute dyspnea secondary to COPD and CHF exacerbation with a very small segmental PE seen on CTA of her chest.  She also has bilateral lower extremity 3-day history of increasing edema with right leg pain.  After reviewing CT scans with Dr. Selinda Gu MD patient does not need a pulmonary thrombectomy or any intervention related to her small pulmonary embolism.  However due to his significant decrease in her arterial duplex ultrasounds with ABIs she will require right lower extremity angiogram with possible intervention due to the nonhealing blisters to bilateral lower extremities and the pain to her right lower extremity.  Due to the above findings vascular surgery planning on taking patient to the vascular lab for right lower extremity angiogram with possible intervention on Wednesday, 11/17/2024. I discussed in detail at the bedside today with the patient the procedure, benefits, risk, complications.  Patient verbalized understanding wishes to proceed.  I answered all her questions this morning.  Patient  will be made n.p.o. after midnight on the day of her procedure.  Patient's current BUN is 15 creatinine is 2.09.  She has an estimated GFR of 22.  Patient's most recent hemoglobin is 8.7 and hematocrit is 28.9.  Patient's current platelets are 209.   -I discussed the case in detail with Dr. Selinda Gu MD and he agrees with the plan.   [name] [facility] 11/15/2024 8:20 AM

## 2024-11-15 NOTE — Progress Notes (Signed)
 PHARMACY - ANTICOAGULATION CONSULT NOTE  Pharmacy Consult for Heparin   Indication: pulmonary embolus  Allergies  Allergen Reactions   Aspirin  Other (See Comments)    Can take 81 mg not 325 mg -bleeding   Entresto  [Sacubitril -Valsartan ] Shortness Of Breath    COPD and reports increased SOB with Entresto    Atorvastatin  Itching and Rash    Itching and myaliga     Cyclobenzaprine      muscle relaxers-ulcer hemorrhage (hospitalized), ulcer hemorrhage   Lactose Intolerance (Gi) Other (See Comments)    GI upset   Meperidine Hcl Other (See Comments)    Not known   Pravastatin  Rash   Propoxyphene     Hallucination, hallucinations, vomiting   Wound Dressing Adhesive Other (See Comments)    red, stings    Patient Measurements: Height: 5' 2 (157.5 cm) Weight: 93.8 kg (206 lb 12.7 oz) IBW/kg (Calculated) : 50.1 HEPARIN  DW (KG): 71.4  Vital Signs: Temp: 98.2 F (36.8 C) (12/08 0412) Temp Source: Oral (12/07 2010) BP: 117/61 (12/08 0412) Pulse Rate: 69 (12/08 0412)  Labs: Recent Labs    11/14/24 0022 11/14/24 0354 11/14/24 1806 11/15/24 0509  HGB 9.8*  --   --  8.7*  HCT 32.6*  --   --  28.9*  PLT 239  --   --  209  APTT  --  41* 195* 97*  LABPROT  --  16.5*  --   --   INR  --  1.3*  --   --   HEPARINUNFRC  --  >1.10*  --  >1.10*  CREATININE 1.39*  --   --  2.00*    Estimated Creatinine Clearance: 20.4 mL/min (A) (by C-G formula based on SCr of 2 mg/dL (H)).   Medical History: Past Medical History:  Diagnosis Date   ACUT DUOD ULCER W/HEMORR&PERF W/O MENTION OBST 10/05/2009   NSAID induced   ALLERGIC RHINITIS CAUSE UNSPECIFIED    ANEMIA-NOS    CAD (coronary artery disease) 06/08/2009   DEs RCA with 70% LAD and EF 60%   CHF (congestive heart failure) (HCC)    COPD    mild obst on PFTs 03/2010   Diabetes mellitus 06/2010 dx   Mild, diet controlled   GLAUCOMA    HYPERLIPIDEMIA    HYPERTENSION, BENIGN    MYOCARDIAL INFARCTION 06/08/2009   des to rca    Persistent atrial fibrillation (HCC)    Dx 08/2021   TOBACCO ABUSE     Medications:  Medications Prior to Admission  Medication Sig Dispense Refill Last Dose/Taking   acetaminophen  (TYLENOL ) 500 MG tablet Take 1,000 mg by mouth every 12 (twelve) hours.   11/13/2024 at  8:00 PM   acetaminophen  (TYLENOL ) 500 MG tablet Take 500 mg by mouth every 6 (six) hours as needed for mild pain (pain score 1-3) or moderate pain (pain score 4-6).   11/13/2024 at  4:45 AM   apixaban  (ELIQUIS ) 2.5 MG TABS tablet Take 1 tablet (2.5 mg total) by mouth 2 (two) times daily. 180 tablet 3 11/13/2024 at  9:00 PM   dorzolamide  (TRUSOPT ) 2 % ophthalmic solution Place 1 drop into both eyes 2 (two) times daily.   11/13/2024 at  9:00 PM   furosemide  (LASIX ) 40 MG tablet Take 1 tablet (40 mg total) by mouth every other day.   11/13/2024 at  8:00 AM   gabapentin  (NEURONTIN ) 100 MG capsule Take 1-2 capsules (100-200 mg total) by mouth See admin instructions. Take 1 capsule (100mg ) by mouth every morning  and take 2 capsules (200mg ) by mouth at bedtime (Patient taking differently: Take 100 mg by mouth daily.) 60 capsule 0 11/13/2024 at  9:00 AM   gabapentin  (NEURONTIN ) 100 MG capsule Take 200 mg by mouth at bedtime.   11/13/2024 at  9:00 PM   hydrOXYzine  (ATARAX ) 25 MG tablet Take 25 mg by mouth every 8 (eight) hours as needed for itching.   Unknown   ipratropium-albuterol  (DUONEB) 0.5-2.5 (3) MG/3ML SOLN Take 3 mLs by nebulization every 6 (six) hours as needed. 360 mL 1 11/10/2024   latanoprost  (XALATAN ) 0.005 % ophthalmic solution Place 1 drop into both eyes at bedtime.   11/13/2024 at  9:00 PM   NITROSTAT  0.4 MG SL tablet DISSOLVE ONE TABLET UNDER TONGUE AS NEEDED FOR CHEST PAIN - MAY REPEAT TWICE-IF NO RELIEF GO TO NEAREST HOSPITAL ER 25 tablet 0 Unknown   nystatin  (MYCOSTATIN /NYSTOP ) powder Apply topically 3 (three) times daily. (Patient taking differently: Apply 1 Application topically 3 (three) times daily.) 15 g 0 11/13/2024 at  9:00  PM   pantoprazole  (PROTONIX ) 40 MG tablet Take 1 tablet (40 mg total) by mouth daily. 90 tablet 3 11/13/2024 at  9:00 AM   potassium chloride  SA (KLOR-CON  M) 20 MEQ tablet Taking 40meq daily   11/13/2024 at  9:00 AM   predniSONE  (DELTASONE ) 20 MG tablet Take 1 tablet (20 mg total) by mouth daily.   11/13/2024 at  9:00 AM   Tiotropium Bromide-Olodaterol (STIOLTO RESPIMAT) 2.5-2.5 MCG/ACT AERS Inhale 2 puffs into the lungs daily.   11/13/2024 at  9:00 AM   VENTOLIN  HFA 108 (90 Base) MCG/ACT inhaler Inhale 1 puff into the lungs every 6 (six) hours as needed for shortness of breath or wheezing.   Unknown   repaglinide  (PRANDIN ) 0.5 MG tablet Take 1 tablet (0.5 mg total) by mouth 2 (two) times daily before a meal. (Patient not taking: Reported on 11/14/2024)   Not Taking   rosuvastatin  (CRESTOR ) 20 MG tablet Take 20 mg by mouth daily.   11/11/2024    Assessment: Pharmacy consulted to dose heparin  in this 89 year old female admitted with PE.  Pt was on Eliquis  2.5 mg PO BID PTA, last dose on 12/6 @ 2100.  Called lab at 19:41 and critical value of 195 for aPTT. Will use 75 kg for heparin  dosing weight.   Goal of Therapy:  Heparin  level 0.3-0.7 units/ml aPTT 66 - 102  seconds Monitor platelets by anticoagulation protocol: Yes   Plan:  12/8 @ 0509:  aPTT = 97,  HL = > 1.10 - aPTT therapeutic X 1 ,  HL elevated from PTA Eliquis  - will continue pt on current rate and recheck aPTT in 8 hrs - recheck HL on 12/9 with AM labs - use aPTT to guide dosing until correlating with HL - CBC daily   Zoua Caporaso D, PharmD 11/15/2024,6:23 AM

## 2024-11-15 NOTE — Progress Notes (Signed)
 PHARMACY - ANTICOAGULATION CONSULT NOTE  Pharmacy Consult for Heparin   Indication: pulmonary embolus  Patient Measurements: Height: 5' 2 (157.5 cm) Weight: 93.8 kg (206 lb 12.7 oz) IBW/kg (Calculated) : 50.1 HEPARIN  DW (KG): 71.4  Labs: Recent Labs    11/14/24 0022 11/14/24 0354 11/14/24 0354 11/14/24 1806 11/15/24 0509 11/15/24 1248  HGB 9.8*  --   --   --  8.7*  --   HCT 32.6*  --   --   --  28.9*  --   PLT 239  --   --   --  209  --   APTT  --  41*   < > 195* 97* 109*  LABPROT  --  16.5*  --   --   --   --   INR  --  1.3*  --   --   --   --   HEPARINUNFRC  --  >1.10*  --   --  >1.10*  --   CREATININE 1.39*  --   --   --  2.00*  --    < > = values in this interval not displayed.   Estimated Creatinine Clearance: 20.4 mL/min (A) (by C-G formula based on SCr of 2 mg/dL (H)).  Medical History: Past Medical History:  Diagnosis Date   ACUT DUOD ULCER W/HEMORR&PERF W/O MENTION OBST 10/05/2009   NSAID induced   ALLERGIC RHINITIS CAUSE UNSPECIFIED    ANEMIA-NOS    CAD (coronary artery disease) 06/08/2009   DEs RCA with 70% LAD and EF 60%   CHF (congestive heart failure) (HCC)    COPD    mild obst on PFTs 03/2010   Diabetes mellitus 06/2010 dx   Mild, diet controlled   GLAUCOMA    HYPERLIPIDEMIA    HYPERTENSION, BENIGN    MYOCARDIAL INFARCTION 06/08/2009   des to rca   Persistent atrial fibrillation (HCC)    Dx 08/2021   TOBACCO ABUSE    Assessment: Pharmacy consulted to dose heparin  in this 89 year old female admitted with PE.  Pt was on Eliquis  2.5 mg PO BID PTA, last dose on 12/6 @ 2100.  Goal of Therapy:  Heparin  level 0.3-0.7 units/ml aPTT 66 - 102  seconds Monitor platelets by anticoagulation protocol: Yes   Plan:  --aPTT is supratherapeutic --Decrease heparin  infusion to 900 units/hr --Re-check aPTT 8 hours from rate change --Daily CBC per protocol while on IV heparin   Norm Wray B Lachina Salsberry 11/15/2024,1:51 PM

## 2024-11-16 ENCOUNTER — Other Ambulatory Visit (HOSPITAL_COMMUNITY): Payer: Self-pay

## 2024-11-16 ENCOUNTER — Telehealth (HOSPITAL_COMMUNITY): Payer: Self-pay | Admitting: Pharmacy Technician

## 2024-11-16 DIAGNOSIS — I2699 Other pulmonary embolism without acute cor pulmonale: Secondary | ICD-10-CM

## 2024-11-16 DIAGNOSIS — L03116 Cellulitis of left lower limb: Secondary | ICD-10-CM | POA: Diagnosis not present

## 2024-11-16 LAB — CBC
HCT: 28.3 % — ABNORMAL LOW (ref 36.0–46.0)
Hemoglobin: 8.5 g/dL — ABNORMAL LOW (ref 12.0–15.0)
MCH: 25.4 pg — ABNORMAL LOW (ref 26.0–34.0)
MCHC: 30 g/dL (ref 30.0–36.0)
MCV: 84.5 fL (ref 80.0–100.0)
Platelets: 166 K/uL (ref 150–400)
RBC: 3.35 MIL/uL — ABNORMAL LOW (ref 3.87–5.11)
RDW: 19.8 % — ABNORMAL HIGH (ref 11.5–15.5)
WBC: 7.7 K/uL (ref 4.0–10.5)
nRBC: 2.1 % — ABNORMAL HIGH (ref 0.0–0.2)

## 2024-11-16 LAB — APTT
aPTT: 92 s — ABNORMAL HIGH (ref 24–36)
aPTT: 54 s — ABNORMAL HIGH (ref 24–36)
aPTT: 121 s — ABNORMAL HIGH (ref 24–36)

## 2024-11-16 LAB — BASIC METABOLIC PANEL WITH GFR
Anion gap: 11 (ref 5–15)
BUN: 52 mg/dL — ABNORMAL HIGH (ref 8–23)
CO2: 25 mmol/L (ref 22–32)
Calcium: 8.8 mg/dL — ABNORMAL LOW (ref 8.9–10.3)
Chloride: 97 mmol/L — ABNORMAL LOW (ref 98–111)
Creatinine, Ser: 1.99 mg/dL — ABNORMAL HIGH (ref 0.44–1.00)
GFR, Estimated: 23 mL/min — ABNORMAL LOW (ref >60)
Glucose, Bld: 128 mg/dL — ABNORMAL HIGH (ref 70–99)
Potassium: 4.9 mmol/L (ref 3.5–5.1)
Sodium: 133 mmol/L — ABNORMAL LOW (ref 135–145)

## 2024-11-16 LAB — GLUCOSE, CAPILLARY
Glucose-Capillary: 121 mg/dL — ABNORMAL HIGH (ref 70–99)
Glucose-Capillary: 183 mg/dL — ABNORMAL HIGH (ref 70–99)
Glucose-Capillary: 132 mg/dL — ABNORMAL HIGH (ref 70–99)
Glucose-Capillary: 213 mg/dL — ABNORMAL HIGH (ref 70–99)

## 2024-11-16 LAB — HEPARIN LEVEL (UNFRACTIONATED): Heparin Unfractionated: 0.96 [IU]/mL — ABNORMAL HIGH (ref 0.30–0.70)

## 2024-11-16 MED ORDER — CEPHALEXIN 500 MG PO CAPS
500.0000 mg | ORAL_CAPSULE | Freq: Three times a day (TID) | ORAL | Status: AC
  Administered 2024-11-16 – 2024-11-25 (×29): 500 mg via ORAL
  Filled 2024-11-16 (×29): qty 1

## 2024-11-16 MED ORDER — HEPARIN BOLUS VIA INFUSION
2000.0000 [IU] | Freq: Once | INTRAVENOUS | Status: AC
  Administered 2024-11-16: 2000 [IU] via INTRAVENOUS
  Filled 2024-11-16: qty 2000

## 2024-11-16 MED ORDER — PREDNISONE 10 MG PO TABS
10.0000 mg | ORAL_TABLET | Freq: Every day | ORAL | Status: DC
  Administered 2024-11-17 – 2024-11-21 (×5): 10 mg via ORAL
  Filled 2024-11-16 (×5): qty 1

## 2024-11-16 MED ORDER — FUROSEMIDE 40 MG PO TABS
40.0000 mg | ORAL_TABLET | Freq: Every day | ORAL | Status: DC
  Administered 2024-11-16 – 2024-11-20 (×5): 40 mg via ORAL
  Filled 2024-11-16 (×5): qty 1

## 2024-11-16 NOTE — H&P (View-Only) (Signed)
 Progress Note    11/16/2024 12:17 PM * No surgery found *  Subjective:  Jenny [name] is an 89 yo female who is admitted with several acute conditions including lower extremity cellulitis with SIRS, and acute dyspnea secondary to COPD and CHF exacerbation with a segmental PE seen on CTA chest. She presented to the ED with a 3-day history of lower extremity edema and with right leg pain that started on the night of arrival.   Upon work up she underwent bilateral lower extremity arterial duplex doppler ultrasounds. She is noted to have moderate to severe atherosclerosis to both lower extremities with poor ABI's and monophasic waveforms.   Patient rests comfortably in bed this morning. Lower extremities are bandaged by podiatry. Toes remain purple and discolored. No complaints overnight and vitals all remain stable.    Vitals:   11/16/24 0412 11/16/24 1135  BP: (!) 149/116 (!) 140/95  Pulse: 84 67  Resp:  (!) 21  Temp:  97.8 F (36.6 C)  SpO2: 98% 96%   Physical Exam: Cardiac:  RRR, Normal S1, S2. No murmurs. Lungs:  Positive labored breathing due to her COPD with 2 liters [place] oxygen. Positive Rhonchi throughout. No rales but diminished breath sounds in the bases.  Incisions:  None Extremities:  All extremities are warm to touch with palpable pulses in upper extremities. Lower extremities are bandaged but assessment of toes being purple. Only able to get doppler DP/PT pulses and they are weak  Abdomen:  Positive bowel sounds throughout, morbid obesity, soft non tender  Neurologic: AAOX3 answers all questions and follows commands.   CBC    Component Value Date/Time   WBC 7.7 11/16/2024 0649   RBC 3.35 (L) 11/16/2024 0649   HGB 8.5 (L) 11/16/2024 0649   HCT 28.3 (L) 11/16/2024 0649   PLT 166 11/16/2024 0649   MCV 84.5 11/16/2024 0649   MCH 25.4 (L) 11/16/2024 0649   MCHC 30.0 11/16/2024 0649   RDW 19.8 (H) 11/16/2024 0649   LYMPHSABS 0.3 (L) 11/14/2024 0022   MONOABS 0.7  11/14/2024 0022   EOSABS 0.0 11/14/2024 0022   BASOSABS 0.0 11/14/2024 0022    BMET    Component Value Date/Time   NA 133 (L) 11/16/2024 0649   NA 139 10/21/2024 1046   K 4.9 11/16/2024 0649   CL 97 (L) 11/16/2024 0649   CO2 25 11/16/2024 0649   GLUCOSE 128 (H) 11/16/2024 0649   BUN 52 (H) 11/16/2024 0649   BUN 48 (H) 10/21/2024 1046   CREATININE 1.99 (H) 11/16/2024 0649   CALCIUM  8.8 (L) 11/16/2024 0649   GFRNONAA 23 (L) 11/16/2024 0649   GFRAA  10/09/2009 0350    >60        The eGFR has been calculated using the MDRD equation. This calculation has not been validated in all clinical situations. eGFR's persistently <60 mL/min signify possible Chronic Kidney Disease.    INR    Component Value Date/Time   INR 1.3 (H) 11/14/2024 0354     Intake/Output Summary (Last 24 hours) at 11/16/2024 1217 Last data filed at 11/16/2024 1136 Gross per 24 hour  Intake 873.16 ml  Output 1225 ml  Net -351.84 ml     Assessment/Plan:  89 y.o. female  who presents to [facility] emergency room with a 3-day history of lower extremity edema and with right leg pain that started on the night of arrival. * No surgery found *   PLAN Due to the above findings vascular surgery planning on  taking patient to the vascular lab for right lower extremity angiogram with possible intervention on Wednesday, 11/17/2024.   I discussed in detail at the bedside today with the patient the procedure, benefits, risk, complications. Patient verbalized understanding wishes to proceed. I answered all her questions this morning. Patient will be made n.p.o. after midnight on the day of her procedure. Patient's current BUN is 15 creatinine is 2.09. She has an estimated GFR of 22. Patient's most recent hemoglobin is 8.7 and hematocrit is 28.9. Patient's current platelets are 209.   I discussed the case in detail with Dr. Selinda Gu MD and he agrees with the plan.   DVT prophylaxis:  Heparin  Infusion    [name] 11/16/2024 12:17 PM

## 2024-11-16 NOTE — Evaluation (Addendum)
 Physical Therapy Evaluation Patient Details Name: Jenny [name] MRN: [id number] DOB: 27-Sep-1933 Today's Date: 11/16/2024  History of Present Illness  Pt is an 89 y/o F admitted on 11/14/24 after presenting with c/o BLE pain, SOB. Pt is being treated for BLE cellulitis, also found to have acute PE. PMH: HFpEF, HTN, persistent a-fib on Eliquis , tachybradycardia syndrome, CAD, COPD, CKD 3B, DM2, anemia  Clinical Impression  MD cleared pt for participation in therapy. Pt seen for PT evaluation with pt asleep but awakened & agreeable to tx. Pt reports she's done minimal ambulation in rehab, expresses fear of falling 2/2 4 falls prior to rehab. On this date, pt requires mod assist for bed mobility, min assist for transfers. Pt declined gait attempts despite PT offering chair follow for safety. Pt with labored/loud breathing but SpO2 >90% throughout on room air. Recommend ongoing PT services to progress mobility as able.        If plan is discharge home, recommend the following: A little help with walking and/or transfers;A little help with bathing/dressing/bathroom;Assistance with cooking/housework;Assist for transportation;Help with stairs or ramp for entrance   Can travel by private vehicle   Yes    Equipment Recommendations None recommended by PT (defer to next venue)  Recommendations for Other Services  Rehab consult, OT consult   Functional Status Assessment Patient has had a recent decline in their functional status and demonstrates the ability to make significant improvements in function in a reasonable and predictable amount of time.     Precautions / Restrictions Precautions Precautions: Fall Restrictions Weight Bearing Restrictions Per Provider Order: No      Mobility  Bed Mobility Overal bed mobility: Needs Assistance Bed Mobility: Supine to Sit     Supine to sit: Mod assist, HOB elevated, Used rails (exit L side of bed, assistance to move LLE to EOB, upright trunk)           Transfers Overall transfer level: Needs assistance Equipment used: Rolling walker (2 wheels) Transfers: Sit to/from Stand, Bed to chair/wheelchair/BSC Sit to Stand: Min assist   Step pivot transfers: Min assist       General transfer comment: sit>stand from EOB with cuing re: hand placement, bed>recliner on L with RW    Ambulation/Gait Ambulation/Gait assistance:  (pt declined 2/2 fear of falling despite encouragement, education, PT reporting will provide chair follow for safety)                Stairs            Wheelchair Mobility     Tilt Bed    Modified Rankin (Stroke Patients Only)       Balance Overall balance assessment: Needs assistance, History of Falls   Sitting balance-Leahy Scale: Fair     Standing balance support: During functional activity, Reliant on assistive device for balance, Bilateral upper extremity supported Standing balance-Leahy Scale: Poor                               Pertinent Vitals/Pain Pain Assessment Pain Assessment: Faces Faces Pain Scale: Hurts little more Pain Location: L groin Pain Descriptors / Indicators: Discomfort Pain Intervention(s): Monitored during session, Limited activity within patient's tolerance    Home Living Family/patient expects to be discharged to:: Skilled nursing facility Living Arrangements: Alone Available Help at Discharge: Family;Neighbor;Available PRN/intermittently Type of Home: House Home Access: Ramped entrance       Home Layout: One level Home Equipment: Rollator (  4 wheels);BSC/3in1;Tub bench;Grab bars - tub/shower Additional Comments: Pt from SNF, prior to this pt from home alone, has family but all work full time. very little assistance available    Prior Function Prior Level of Function : Independent/Modified Independent;History of Falls (last six months)             Mobility Comments: Prior to previous admission: reports 3 falls in 3 weeks, mostly  sedentary outside of necesary trips to bathroom ADLs Comments: Prior to previous admission: assist from family for IADLs, pt reports only 1 meal per day usually     Extremity/Trunk Assessment   Upper Extremity Assessment Upper Extremity Assessment: Generalized weakness    Lower Extremity Assessment Lower Extremity Assessment: Generalized weakness       Communication   Communication Communication: Impaired Factors Affecting Communication: Hearing impaired    Cognition Arousal: Alert Behavior During Therapy: Anxious, WFL for tasks assessed/performed   PT - Cognitive impairments: No apparent impairments                       PT - Cognition Comments: reports fear of falling 2/2 multiple falls prior to prevoius admission Following commands: Intact       Cueing Cueing Techniques: Verbal cues     General Comments General comments (skin integrity, edema, etc.): Pt received with nasal cannula out of nose, pt on room air & left on room air, labored breathing but per pt this is baseline, SpO2 >/= 90% throughout session.    Exercises     Assessment/Plan    PT Assessment Patient needs continued PT services  PT Problem List Decreased strength;Cardiopulmonary status limiting activity;Pain;Decreased range of motion;Decreased activity tolerance;Decreased balance;Decreased safety awareness;Decreased mobility;Decreased knowledge of precautions;Decreased knowledge of use of DME       PT Treatment Interventions DME instruction;Gait training;Functional mobility training;Therapeutic activities;Therapeutic exercise;Balance training;Patient/family education;Neuromuscular re-education    PT Goals (Current goals can be found in the Care Plan section)  Acute Rehab PT Goals Patient Stated Goal: get better PT Goal Formulation: With patient Time For Goal Achievement: 11/30/24 Potential to Achieve Goals: Good    Frequency Min 2X/week     Co-evaluation                AM-PAC PT 6 Clicks Mobility  Outcome Measure Help needed turning from your back to your side while in a flat bed without using bedrails?: A Little Help needed moving from lying on your back to sitting on the side of a flat bed without using bedrails?: A Lot Help needed moving to and from a bed to a chair (including a wheelchair)?: A Little Help needed standing up from a chair using your arms (e.g., wheelchair or bedside chair)?: A Little Help needed to walk in hospital room?: A Lot Help needed climbing 3-5 steps with a railing? : Total 6 Click Score: 14    End of Session   Activity Tolerance: Patient tolerated treatment well Patient left: in chair;with chair alarm set;with call bell/phone within reach Nurse Communication:  (O2; NT made aware of purewick malfunction) PT Visit Diagnosis: Muscle weakness (generalized) (M62.81);History of falling (Z91.81);Difficulty in walking, not elsewhere classified (R26.2);Unsteadiness on feet (R26.81)    Time: 8888-8874 PT Time Calculation (min) (ACUTE ONLY): 14 min   Charges:   PT Evaluation $PT Eval Low Complexity: 1 Low   PT General Charges $$ ACUTE PT VISIT: 1 Visit         Richerd Pinal, PT, DPT 11/16/24, 11:34 AM  Richerd CHRISTELLA Pinal 11/16/2024, 11:33 AM

## 2024-11-16 NOTE — Progress Notes (Signed)
 PHARMACY - ANTICOAGULATION CONSULT NOTE  Pharmacy Consult for Heparin   Indication: pulmonary embolus  Patient Measurements: Height: 5' 2 (157.5 cm) Weight: 92.5 kg (204 lb) IBW/kg (Calculated) : 50.1 HEPARIN  DW (KG): 71.4  Labs: Recent Labs    11/14/24 0022 11/14/24 0354 11/14/24 1806 11/15/24 0509 11/15/24 1248 11/15/24 1410 11/15/24 2309 11/16/24 0649  HGB 9.8*  --   --  8.7*  --   --   --  8.5*  HCT 32.6*  --   --  28.9*  --   --   --  28.3*  PLT 239  --   --  209  --   --   --  166  APTT  --  41*   < > 97* 109*  --  92* 54*  LABPROT  --  16.5*  --   --   --   --   --   --   INR  --  1.3*  --   --   --   --   --   --   HEPARINUNFRC  --  >1.10*  --  >1.10*  --   --   --  0.96*  CREATININE 1.39*  --   --  2.00*  --  2.09*  --   --    < > = values in this interval not displayed.   Estimated Creatinine Clearance: 19.3 mL/min (A) (by C-G formula based on SCr of 2.09 mg/dL (H)).  Medical History: Past Medical History:  Diagnosis Date   ACUT DUOD ULCER W/HEMORR&PERF W/O MENTION OBST 10/05/2009   NSAID induced   ALLERGIC RHINITIS CAUSE UNSPECIFIED    ANEMIA-NOS    CAD (coronary artery disease) 06/08/2009   DEs RCA with 70% LAD and EF 60%   CHF (congestive heart failure) (HCC)    COPD    mild obst on PFTs 03/2010   Diabetes mellitus 06/2010 dx   Mild, diet controlled   GLAUCOMA    HYPERLIPIDEMIA    HYPERTENSION, BENIGN    MYOCARDIAL INFARCTION 06/08/2009   des to rca   Persistent atrial fibrillation (HCC)    Dx 08/2021   TOBACCO ABUSE    Assessment: Pharmacy consulted to dose heparin  in this 89 year old female admitted with PE.  Pt was on Eliquis  2.5 mg PO BID PTA, last dose on 12/6 @ 2100.  Goal of Therapy:  Heparin  level 0.3-0.7 units/ml aPTT 66 - 102  seconds Monitor platelets by anticoagulation protocol: Yes   Plan:  --aPTT is subtherapeutic and not correlating with heparin  level --Give heparin  2000 unit IV bolus and increase infusion rate to 1100  units/hr --Re-check aPTT 8 hours from rate change --Daily CBC per protocol while on IV heparin . Check a heparin  level tomorrow AM  Jenny [name] 11/16/2024,7:30 AM

## 2024-11-16 NOTE — Telephone Encounter (Signed)
 Patient Product/process Development Scientist completed.    The patient is insured through [employer]. Patient has Medicare and is not eligible for a copay card, but may be able to apply for patient assistance or Medicare RX Payment Plan (Patient Must reach out to their plan, if eligible for payment plan), if available.    Ran test claim for Incruse Ellipta  and the current 30 day co-pay is $49.81.   This test claim was processed through [facility]- copay amounts may vary at other pharmacies due to pharmacy/plan contracts, or as the patient moves through the different stages of their insurance plan.     Reyes Sharps, CPHT Pharmacy Technician Patient Advocate Specialist Lead [facility] Patient Advocate Team Direct Number: [phone]  Fax: [phone]

## 2024-11-16 NOTE — Progress Notes (Signed)
 PHARMACY - ANTICOAGULATION CONSULT NOTE  Pharmacy Consult for Heparin   Indication: pulmonary embolus  Patient Measurements: Height: 5' 2 (157.5 cm) Weight: 92.5 kg (204 lb) IBW/kg (Calculated) : 50.1 HEPARIN  DW (KG): 71.4  Labs: Recent Labs    11/14/24 0022 11/14/24 0354 11/14/24 1806 11/15/24 0509 11/15/24 1248 11/15/24 1410 11/15/24 2309 11/16/24 0649 11/16/24 1623  HGB 9.8*  --   --  8.7*  --   --   --  8.5*  --   HCT 32.6*  --   --  28.9*  --   --   --  28.3*  --   PLT 239  --   --  209  --   --   --  166  --   APTT  --  41*   < > 97*   < >  --  92* 54* 121*  LABPROT  --  16.5*  --   --   --   --   --   --   --   INR  --  1.3*  --   --   --   --   --   --   --   HEPARINUNFRC  --  >1.10*  --  >1.10*  --   --   --  0.96*  --   CREATININE 1.39*  --   --  2.00*  --  2.09*  --  1.99*  --    < > = values in this interval not displayed.   Estimated Creatinine Clearance: 20.3 mL/min (A) (by C-G formula based on SCr of 1.99 mg/dL (H)).  Medical History: Past Medical History:  Diagnosis Date   ACUT DUOD ULCER W/HEMORR&PERF W/O MENTION OBST 10/05/2009   NSAID induced   ALLERGIC RHINITIS CAUSE UNSPECIFIED    ANEMIA-NOS    CAD (coronary artery disease) 06/08/2009   DEs RCA with 70% LAD and EF 60%   CHF (congestive heart failure) (HCC)    COPD    mild obst on PFTs 03/2010   Diabetes mellitus 06/2010 dx   Mild, diet controlled   GLAUCOMA    HYPERLIPIDEMIA    HYPERTENSION, BENIGN    MYOCARDIAL INFARCTION 06/08/2009   des to rca   Persistent atrial fibrillation (HCC)    Dx 08/2021   TOBACCO ABUSE    Assessment: Pharmacy consulted to dose heparin  in this 89 year old female admitted with PE.  Pt was on Eliquis  2.5 mg PO BID PTA, last dose on 12/6 @ 2100.  Goal of Therapy:  Heparin  level 0.3-0.7 units/ml aPTT 66 - 102  seconds Monitor platelets by anticoagulation protocol: Yes   12/9 1623 aPTT 121s, supratherapeutic @ 1100 u/hr  Plan:  aPTT  supratherapeutic Decrease heparin  infusion to 1000 units/hr Recheck aPTT 8 hours after rate change and HL with AM labs Adjust based on aPTT until correlation with HL CBC daily while on heparin   Kayla JULIANNA Blew, PharmD 11/16/2024,4:49 PM

## 2024-11-16 NOTE — Progress Notes (Signed)
 PROGRESS NOTE    Jenny [name]  FMW:[id number] DOB: July 01, 1933 DOA: 11/14/2024 PCP: [facility]   Outpatient Specialists: cardiology    Brief Narrative:   From admission h and p   Jenny [name] is a 89 y.o. female with medical history significant for HFpEF, hypertension, persistent A-fib on Eliquis , tachybradycardia syndrome, CAD, COPD, CKD stage 3b, type 2 diabetes, anemia,, being admitted with several acute conditions including lower extremity cellulitis with SIRS, and acute dyspnea secondary to COPD and CHF exacerbation with a segmental PE seen on CTA chest.  She presented to the ED with a 3-day history of lower extremity edema and with right leg pain that started on the night of arrival. In the ED she was tachypneic requiring 2 L to maintain sats in the high 90s labs notable for troponin 108, proBNP 2277 WBC 10.7 with lactic acid 1.3 and negative respiratory viral panel Hemoglobin at baseline at 9.8 Creatinine at baseline at 1.39  EKG showed A-fib at 70 with nonspecific T wave changes CTA chest showed tiny segmental PE left lower lobe without heart strain, extensive multivessel CAD and mild asymmetric pulmonary edema around the left hilum Bilateral lower extremity venous Dopplers ordered from the ED, result pending   Patient was treated with DuoNebs and Solu-Medrol .  She initially received LR boluses but subsequently given a dose of Lasix  as test results came in.  Started on vancomycin  and Rocephin  and also on a heparin  infusion for PE  Assessment & Plan:   Principal Problem:   Bilateral lower leg cellulitis Active Problems:   COPD with acute exacerbation (HCC)   CAD (coronary artery disease)   Acute on chronic heart failure with preserved ejection fraction (HFpEF) (HCC)   Acute pulmonary embolism (HCC)   Essential hypertension   Tachycardia-bradycardia syndrome (HCC)   Permanent atrial fibrillation (HCC)   Stage 3b chronic kidney disease (HCC)    CKD (chronic kidney disease) stage 4, GFR 15-29 ml/min (HCC)   Type 2 diabetes mellitus with diabetic chronic kidney disease (HCC)   Type II diabetes mellitus with peripheral circulatory disorder (HCC)   Obesity, Class III, BMI 40-49.9 (morbid obesity) (HCC)   Sepsis (HCC)   PAD (peripheral artery disease)  # Venous stasis # Cellulitis # PAD? Worsening erythema and swelling b/l lower extremities with open ulcers over dorsum of feet bilaterally. Chf likely contributing. Arterial dopplers show at least moderate PAD. Podiatry debrided ulcers at bedside 12/7 and placed wound care instructions - continue wound care - vascular planning angiogram on 12/10 - will convert cefazolin  to keflex  today  # Acute PE Very small. On apixaban  2.5 bid at baseline, patient unsure if she's been receiving regularly at snf. Given cr under 1.5 assumes stays there would warrant full dose of 5 bid - heparin  for now pending vascular intervention  # HFpEF with acute exacerbation Recent hospitalization with dose reduction in diuretic, likely under-diuresed (discharged on EOD lasix  40, had previously been on bid dosing). Cr up from admission after IV diuresis, but stable - will start lasix  40 mg oral daily  # Hyperkalemia K 5.3 yesterday treated with lokelma  improved to 4.9 today - monitor  # AKI on ckd 3b Cr 2 from 1.39 on admission  - resuming oral lasix  today, would need to hold if kidney function worsens  # COPD  Do not think exacerbated. Arrives on prednisone  20 and says has been maintained on that for a couple of months - will decrease prednisone  to 10 daily,  at that dose probably doesn't need pjp ppx - will need close outpt pulm f/u  # History tachy-brady # A-fib Rate controlled currently - heparin  as above  # Obesity noted  # Elevated troponin Mild, stable, flat, likely demand as no chest pain.   # Neuropathy - home gabapentin   # Pulm nodule - outpt f/u  # Debility Comes from rehab -  PT consulted - patient does not want to return to [facility]. TOC aware, looking elsewhere  DVT prophylaxis: n/a Code Status: dnr/dni confirmed with patient at bedside 12/7 Family Communication: son michael telephonically 12/9  Level of care: Telemetry Status is: Inpatient Remains inpatient appropriate because: severity of illness    Consultants:  podiatry Vascular surgery  Procedures: Bedside debridement podiatry 12/7  Antimicrobials:  Vanc/ceftriaxone  > cefazolin    Subjective:   Reports mild dyspnea stable, occasional cough, stable leg pain  Objective: Vitals:   11/15/24 2328 11/16/24 0412 11/16/24 0500 11/16/24 1135  BP: 126/72 (!) 149/116  (!) 140/95  Pulse: 88 84  67  Resp: 20   (!) 21  Temp: 98.1 F (36.7 C)   97.8 F (36.6 C)  TempSrc:      SpO2: 96% 98%  96%  Weight:   92.5 kg   Height:        Intake/Output Summary (Last 24 hours) at 11/16/2024 1155 Last data filed at 11/16/2024 1136 Gross per 24 hour  Intake 873.16 ml  Output 1225 ml  Net -351.84 ml   Filed Weights   11/14/24 2010 11/15/24 0409 11/16/24 0500  Weight: 92 kg 93.8 kg 92.5 kg    Examination:  General exam: Appears calm and comfortable  Respiratory system: rales at bases Cardiovascular system: S1 & S2 heard, RRR.  Gastrointestinal system: Abdomen is nondistended, soft and nontender. No organomegaly or masses felt. Normal bowel sounds heard. Central nervous system: Alert and oriented. No focal neurological deficits. Photos from admission Extremities:    Psychiatry: Judgement and insight appear normal. Mood & affect appropriate.     Data Reviewed: I have personally reviewed following labs and imaging studies  CBC: Recent Labs  Lab 11/14/24 0022 11/15/24 0509 11/16/24 0649  WBC 10.7* 11.8* 7.7  NEUTROABS 9.4*  --   --   HGB 9.8* 8.7* 8.5*  HCT 32.6* 28.9* 28.3*  MCV 85.8 84.8 84.5  PLT 239 209 166   Basic Metabolic Panel: Recent Labs  Lab 11/14/24 0022  11/15/24 0509 11/15/24 1410 11/16/24 0649  NA 135 134* 133* 133*  K 5.1 5.3* 5.4* 4.9  CL 98 96* 95* 97*  CO2 25 24 22 25   GLUCOSE 127* 174* 184* 128*  BUN 43* 48* 50* 52*  CREATININE 1.39* 2.00* 2.09* 1.99*  CALCIUM  8.5* 8.7* 8.9 8.8*   GFR: Estimated Creatinine Clearance: 20.3 mL/min (A) (by C-G formula based on SCr of 1.99 mg/dL (H)). Liver Function Tests: Recent Labs  Lab 11/14/24 0022  AST 17  ALT 20  ALKPHOS 129*  BILITOT 0.3  PROT 6.1*  ALBUMIN 3.7   No results for input(s): LIPASE, AMYLASE in the last 168 hours. No results for input(s): AMMONIA in the last 168 hours. Coagulation Profile: Recent Labs  Lab 11/14/24 0354  INR 1.3*   Cardiac Enzymes: No results for input(s): CKTOTAL, CKMB, CKMBINDEX, TROPONINI in the last 168 hours. BNP (last 3 results) Recent Labs    11/14/24 0022  PROBNP 2,277.0*   HbA1C: No results for input(s): HGBA1C in the last 72 hours. CBG: Recent Labs  Lab  11/15/24 1221 11/15/24 1622 11/15/24 2124 11/16/24 0814 11/16/24 1138  GLUCAP 188* 187* 205* 121* 183*   Lipid Profile: No results for input(s): CHOL, HDL, LDLCALC, TRIG, CHOLHDL, LDLDIRECT in the last 72 hours. Thyroid  Function Tests: No results for input(s): TSH, T4TOTAL, FREET4, T3FREE, THYROIDAB in the last 72 hours. Anemia Panel: No results for input(s): VITAMINB12, FOLATE, FERRITIN, TIBC, IRON , RETICCTPCT in the last 72 hours. Urine analysis:    Component Value Date/Time   COLORURINE YELLOW 10/06/2024 2103   APPEARANCEUR CLEAR 10/06/2024 2103   LABSPEC 1.008 10/06/2024 2103   PHURINE 6.0 10/06/2024 2103   GLUCOSEU NEGATIVE 10/06/2024 2103   HGBUR MODERATE (A) 10/06/2024 2103   BILIRUBINUR NEGATIVE 10/06/2024 2103   KETONESUR NEGATIVE 10/06/2024 2103   PROTEINUR NEGATIVE 10/06/2024 2103   NITRITE NEGATIVE 10/06/2024 2103   LEUKOCYTESUR NEGATIVE 10/06/2024 2103   Sepsis  Labs: @LABRCNTIP (procalcitonin:4,lacticidven:4)  ) Recent Results (from the past 240 hours)  Blood culture (routine x 2)     Status: None (Preliminary result)   Collection Time: 11/14/24  1:22 AM   Specimen: BLOOD  Result Value Ref Range Status   Specimen Description BLOOD RIGHT ANTECUBITAL  Final   Special Requests   Final    BOTTLES DRAWN AEROBIC AND ANAEROBIC Blood Culture results may not be optimal due to an inadequate volume of blood received in culture bottles   Culture   Final    NO GROWTH 2 DAYS Performed at [facility], [address] [place], [place] [address]    Report Status PENDING  Incomplete  Blood culture (routine x 2)     Status: None (Preliminary result)   Collection Time: 11/14/24  1:22 AM   Specimen: BLOOD  Result Value Ref Range Status   Specimen Description BLOOD BLOOD LEFT FOREARM  Final   Special Requests   Final    BOTTLES DRAWN AEROBIC AND ANAEROBIC Blood Culture adequate volume   Culture   Final    NO GROWTH 2 DAYS Performed at [facility], [address] [place], [place] [address]    Report Status PENDING  Incomplete  Resp panel by RT-PCR (RSV, Flu A&B, Covid) Anterior Nasal Swab     Status: None   Collection Time: 11/14/24  1:22 AM   Specimen: Anterior Nasal Swab  Result Value Ref Range Status   SARS Coronavirus 2 by RT PCR NEGATIVE NEGATIVE Final    Comment: (NOTE) SARS-CoV-2 target nucleic acids are NOT DETECTED.  The SARS-CoV-2 RNA is generally detectable in upper respiratory specimens during the acute phase of infection. The lowest concentration of SARS-CoV-2 viral copies this assay can detect is 138 copies/mL. A negative result does not preclude SARS-Cov-2 infection and should not be used as the sole basis for treatment or other patient management decisions. A negative result may occur with  improper specimen collection/handling, submission of specimen other than nasopharyngeal swab, presence of viral mutation(s)  within the areas targeted by this assay, and inadequate number of viral copies(<138 copies/mL). A negative result must be combined with clinical observations, patient history, and epidemiological information. The expected result is Negative.  Fact Sheet for Patients:  bloggercourse.com  Fact Sheet for Healthcare Providers:  seriousbroker.it  This test is no t yet approved or cleared by the [place]  FDA and  has been authorized for detection and/or diagnosis of SARS-CoV-2 by FDA under an Emergency Use Authorization (EUA). This EUA will remain  in effect (meaning this test can be used) for the duration of the COVID-19  declaration under Section 564(b)(1) of the Act, 21 U.S.C.section 360bbb-3(b)(1), unless the authorization is terminated  or revoked sooner.       Influenza A by PCR NEGATIVE NEGATIVE Final   Influenza B by PCR NEGATIVE NEGATIVE Final    Comment: (NOTE) The Xpert Xpress SARS-CoV-2/FLU/RSV plus assay is intended as an aid in the diagnosis of influenza from Nasopharyngeal swab specimens and should not be used as a sole basis for treatment. Nasal washings and aspirates are unacceptable for Xpert Xpress SARS-CoV-2/FLU/RSV testing.  Fact Sheet for Patients: bloggercourse.com  Fact Sheet for Healthcare Providers: seriousbroker.it  This test is not yet approved or cleared by the [place]  FDA and has been authorized for detection and/or diagnosis of SARS-CoV-2 by FDA under an Emergency Use Authorization (EUA). This EUA will remain in effect (meaning this test can be used) for the duration of the COVID-19 declaration under Section 564(b)(1) of the Act, 21 U.S.C. section 360bbb-3(b)(1), unless the authorization is terminated or revoked.     Resp Syncytial Virus by PCR NEGATIVE NEGATIVE Final    Comment: (NOTE) Fact Sheet for  Patients: bloggercourse.com  Fact Sheet for Healthcare Providers: seriousbroker.it  This test is not yet approved or cleared by the [place]  FDA and has been authorized for detection and/or diagnosis of SARS-CoV-2 by FDA under an Emergency Use Authorization (EUA). This EUA will remain in effect (meaning this test can be used) for the duration of the COVID-19 declaration under Section 564(b)(1) of the Act, 21 U.S.C. section 360bbb-3(b)(1), unless the authorization is terminated or revoked.  Performed at [facility] [place] [facility], [address], [place], [place] [address]          Radiology Studies: US  ARTERIAL ABI (SCREENING LOWER EXTREMITY) Result Date: 11/14/2024 CLINICAL DATA:  Tobacco abuse, hypertension, claudication, diabetes, hyperlipidemia EXAM: NONINVASIVE PHYSIOLOGIC VASCULAR STUDY OF BILATERAL LOWER EXTREMITIES TECHNIQUE: Evaluation of both lower extremities were performed at rest, including calculation of ankle-brachial indices with single level Doppler, pressure and pulse volume recording. COMPARISON:  08/23/2021 FINDINGS: Right ABI:  0.7 Left ABI:  0.8 Right Lower Extremity:  Abnormal monophasic arterial waveforms. Left Lower Extremity:  Abnormal monophasic arterial waveforms. 0.5-0.79 Moderate PAD IMPRESSION: 1. Abnormal ABI and arterial waveforms within the bilateral lower extremities, consistent with moderate underlying peripheral arterial disease. Electronically Signed   By: Ozell Daring M.D.   On: 11/14/2024 18:00        Scheduled Meds:  arformoterol   15 mcg Nebulization BID   And   umeclidinium bromide   1 puff Inhalation Daily   dorzolamide   1 drop Both Eyes BID   gabapentin   100 mg Oral Daily   guaiFENesin   600 mg Oral BID   hydrocerin   Topical BID   insulin  aspart  0-20 Units Subcutaneous TID WC   insulin  aspart  0-5 Units Subcutaneous QHS   latanoprost   1 drop Both Eyes QHS   pantoprazole   40 mg  Oral Daily   predniSONE   20 mg Oral Q breakfast   rosuvastatin   20 mg Oral Daily   Continuous Infusions:   ceFAZolin  (ANCEF ) IV 100 mL/hr at 11/16/24 0700   heparin  1,100 Units/hr (11/16/24 0823)     LOS: 2 days     Devaughn KATHEE Ban, MD [facility]   If 7PM-7AM, please contact night-coverage [email or web address] Password [email or web address] 11/16/2024, 11:55 AM

## 2024-11-16 NOTE — Care Management Important Message (Signed)
 Important Message  Patient Details  Name: Jenny [name] MRN: [id number] Date of Birth: Nov 23, 1933   Important Message Given:  Yes - Medicare IM     Rojelio SHAUNNA Rattler 11/16/2024, 5:07 PM

## 2024-11-16 NOTE — TOC Initial Note (Signed)
 Transition of Care [facility]) - Initial/Assessment Note    Patient Details  Name: Jenny [name] MRN: [id number] Date of Birth: 1933/11/02  [facility] CM/SW Contact:    Lauraine JAYSON Carpen, LCSW Phone Number: 11/16/2024, 10:34 AM  Clinical Narrative:  CSW met with patient. No family at bedside. CSW introduced role and explained that discharge planning would be discussed. Patient confirmed she was admitted from [facility] and was there for rehab. Per chart review, she discharged to [facility] on 11/25 until her admission on 12/7. She went to [facility] on 11/1 and was there about 3 days. Patient does not want to return to [facility]. PT eval pending. Will complete search within [facility] and [facility] once recommendations are in.                Expected Discharge Plan: Skilled Nursing Facility Barriers to Discharge: Continued Medical Work up   Patient Goals and CMS Choice            Expected Discharge Plan and Services     Post Acute Care Choice: Skilled Nursing Facility Living arrangements for the past 2 months: Skilled Nursing Facility, Single Family Home                                      Prior Living Arrangements/Services Living arrangements for the past 2 months: Skilled Nursing Facility, Single Family Home Lives with:: Self Patient language and need for interpreter reviewed:: Yes        Need for Family Participation in Patient Care: Yes (Comment)     Criminal Activity/Legal Involvement Pertinent to Current Situation/Hospitalization: No - Comment as needed  Activities of Daily Living   ADL Screening (condition at time of admission) Independently performs ADLs?: Yes (appropriate for developmental age) Is the patient deaf or have difficulty hearing?: No Does the patient have difficulty seeing, even when wearing glasses/contacts?: No Does the patient have difficulty concentrating, remembering, or making  decisions?: No  Permission Sought/Granted Permission sought to share information with : Facility Industrial/product Designer granted to share information with : Yes, Verbal Permission Granted     Permission granted to share info w AGENCY: SNF's        Emotional Assessment Appearance:: Appears stated age Attitude/Demeanor/Rapport: Engaged, Gracious Affect (typically observed): Accepting, Appropriate, Calm, Pleasant Orientation: : Oriented to Self, Oriented to Place, Oriented to  Time, Oriented to Situation Alcohol / Substance Use: Not Applicable Psych Involvement: No (comment)  Admission diagnosis:  Acute pulmonary edema (HCC) [J81.0] Acute respiratory failure with hypoxia (HCC) [J96.01] COPD with acute exacerbation (HCC) [J44.1] Bilateral lower leg cellulitis [L03.116, L03.115] Sepsis (HCC) [A41.9] Acute pulmonary embolism without acute cor pulmonale, unspecified pulmonary embolism type (HCC) [I26.99] Patient Active Problem List   Diagnosis Date Noted   PAD (peripheral artery disease) 11/15/2024   Acute pulmonary embolism (HCC) 11/14/2024   Permanent atrial fibrillation (HCC) 11/14/2024   Obesity, Class III, BMI 40-49.9 (morbid obesity) (HCC) 11/14/2024   Sepsis (HCC) 11/14/2024   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) 10/28/2024   Respiratory distress 09/04/2024   CKD (chronic kidney disease) stage 4, GFR 15-29 ml/min (HCC) 09/03/2024   GERD (gastroesophageal reflux disease) 09/03/2024   History of atrial fibrillation 09/03/2024   Acute respiratory failure (HCC) 09/02/2024   Hyponatremia 09/02/2024   Hypokalemia 09/02/2024   AKI (acute kidney injury) 07/02/2024  Pulmonary hypertension, unspecified (HCC) 07/02/2024   Shortness of breath 07/02/2024   Acute dyspnea 07/01/2024   Right hip pain 06/21/2024   SOB (shortness of breath) 06/17/2024   COPD with acute exacerbation (HCC) 06/16/2024   Other secondary pulmonary hypertension (HCC)  10/14/2023   Junctional rhythm 09/27/2023   Acute on chronic heart failure with preserved ejection fraction (HFpEF) (HCC) 09/26/2023   Symptomatic bradycardia 09/26/2023   Pain due to onychomycosis of toenails of both feet 05/27/2023   Type II diabetes mellitus with peripheral circulatory disorder (HCC) 05/27/2023   Tachycardia-bradycardia syndrome (HCC) 03/28/2022   Stage 3b chronic kidney disease (HCC) 02/21/2022   Type 2 diabetes mellitus with diabetic chronic kidney disease (HCC) 02/21/2022   Normocytic anemia 02/21/2022   Chronic obstructive pulmonary disease, unspecified (HCC) 02/21/2022   Edema, unspecified 02/21/2022   Hypertension 02/21/2022   Congestive heart failure (HCC) 02/21/2022   Other and unspecified hyperlipidemia 02/21/2022   Old myocardial infarction 02/21/2022   PAF (paroxysmal atrial fibrillation) (HCC) 02/21/2022   Heart failure (HCC) 11/04/2021   Acute on chronic diastolic CHF (congestive heart failure) (HCC) 11/03/2021   Secondary hypercoagulable state 10/23/2021   Persistent atrial fibrillation (HCC) 10/11/2021   Bilateral lower leg cellulitis 08/22/2021   Obese    Dyspepsia 04/05/2011   Allergic rhinitis 10/16/2010   COPD (chronic obstructive pulmonary disease) (HCC) 05/21/2010   Essential hypertension 04/06/2010   EDEMA 03/09/2010   ANEMIA-NOS 10/11/2009   Unspecified glaucoma 10/11/2009   CAD (coronary artery disease) 10/03/2009   HLD (hyperlipidemia) 07/12/2009   TOBACCO ABUSE 07/12/2009   Acute myocardial infarction (HCC) 07/12/2009   Diabetes mellitus 07/12/2009   PCP:  [facility]  Pharmacy:   [facility] - [place], [place] - [address] [place] [address] Phone: [phone] Fax: [phone]  [facility] [address] [place] [address] Phone: [phone] Fax: [phone]     Social Drivers of Health (SDOH) Social History: SDOH  Screenings   Food Insecurity: No Food Insecurity (11/15/2024)  Housing: Low Risk  (11/15/2024)  Transportation Needs: No Transportation Needs (11/15/2024)  Utilities: Not At Risk (11/15/2024)  Alcohol Screen: Low Risk  (07/02/2024)  Depression (PHQ2-9): Low Risk  (07/22/2022)  Financial Resource Strain: Low Risk  (07/02/2024)  Social Connections: Socially Isolated (11/15/2024)  Tobacco Use: Medium Risk (11/15/2024)   SDOH Interventions:     Readmission Risk Interventions    07/05/2024   10:24 AM  Readmission Risk Prevention Plan  Transportation Screening Complete  HRI or Home Care Consult Complete  Social Work Consult for Recovery Care Planning/Counseling Complete  Palliative Care Screening Not Applicable  Medication Review Oceanographer) Complete

## 2024-11-16 NOTE — Progress Notes (Signed)
 Progress Note    11/16/2024 12:17 PM * No surgery found *  Subjective:  Jenny [name] is an 89 yo female who is admitted with several acute conditions including lower extremity cellulitis with SIRS, and acute dyspnea secondary to COPD and CHF exacerbation with a segmental PE seen on CTA chest. She presented to the ED with a 3-day history of lower extremity edema and with right leg pain that started on the night of arrival.   Upon work up she underwent bilateral lower extremity arterial duplex doppler ultrasounds. She is noted to have moderate to severe atherosclerosis to both lower extremities with poor ABI's and monophasic waveforms.   Patient rests comfortably in bed this morning. Lower extremities are bandaged by podiatry. Toes remain purple and discolored. No complaints overnight and vitals all remain stable.    Vitals:   11/16/24 0412 11/16/24 1135  BP: (!) 149/116 (!) 140/95  Pulse: 84 67  Resp:  (!) 21  Temp:  97.8 F (36.6 C)  SpO2: 98% 96%   Physical Exam: Cardiac:  RRR, Normal S1, S2. No murmurs. Lungs:  Positive labored breathing due to her COPD with 2 liters [place] oxygen. Positive Rhonchi throughout. No rales but diminished breath sounds in the bases.  Incisions:  None Extremities:  All extremities are warm to touch with palpable pulses in upper extremities. Lower extremities are bandaged but assessment of toes being purple. Only able to get doppler DP/PT pulses and they are weak  Abdomen:  Positive bowel sounds throughout, morbid obesity, soft non tender  Neurologic: AAOX3 answers all questions and follows commands.   CBC    Component Value Date/Time   WBC 7.7 11/16/2024 0649   RBC 3.35 (L) 11/16/2024 0649   HGB 8.5 (L) 11/16/2024 0649   HCT 28.3 (L) 11/16/2024 0649   PLT 166 11/16/2024 0649   MCV 84.5 11/16/2024 0649   MCH 25.4 (L) 11/16/2024 0649   MCHC 30.0 11/16/2024 0649   RDW 19.8 (H) 11/16/2024 0649   LYMPHSABS 0.3 (L) 11/14/2024 0022   MONOABS 0.7  11/14/2024 0022   EOSABS 0.0 11/14/2024 0022   BASOSABS 0.0 11/14/2024 0022    BMET    Component Value Date/Time   NA 133 (L) 11/16/2024 0649   NA 139 10/21/2024 1046   K 4.9 11/16/2024 0649   CL 97 (L) 11/16/2024 0649   CO2 25 11/16/2024 0649   GLUCOSE 128 (H) 11/16/2024 0649   BUN 52 (H) 11/16/2024 0649   BUN 48 (H) 10/21/2024 1046   CREATININE 1.99 (H) 11/16/2024 0649   CALCIUM  8.8 (L) 11/16/2024 0649   GFRNONAA 23 (L) 11/16/2024 0649   GFRAA  10/09/2009 0350    >60        The eGFR has been calculated using the MDRD equation. This calculation has not been validated in all clinical situations. eGFR's persistently <60 mL/min signify possible Chronic Kidney Disease.    INR    Component Value Date/Time   INR 1.3 (H) 11/14/2024 0354     Intake/Output Summary (Last 24 hours) at 11/16/2024 1217 Last data filed at 11/16/2024 1136 Gross per 24 hour  Intake 873.16 ml  Output 1225 ml  Net -351.84 ml     Assessment/Plan:  89 y.o. female  who presents to [facility] emergency room with a 3-day history of lower extremity edema and with right leg pain that started on the night of arrival. * No surgery found *   PLAN Due to the above findings vascular surgery planning on  taking patient to the vascular lab for right lower extremity angiogram with possible intervention on Wednesday, 11/17/2024.   I discussed in detail at the bedside today with the patient the procedure, benefits, risk, complications. Patient verbalized understanding wishes to proceed. I answered all her questions this morning. Patient will be made n.p.o. after midnight on the day of her procedure. Patient's current BUN is 15 creatinine is 2.09. She has an estimated GFR of 22. Patient's most recent hemoglobin is 8.7 and hematocrit is 28.9. Patient's current platelets are 209.   I discussed the case in detail with Dr. Selinda Gu MD and he agrees with the plan.   DVT prophylaxis:  Heparin  Infusion    Jenny [name] 11/16/2024 12:17 PM

## 2024-11-16 NOTE — Consult Note (Signed)
 Palliative Care Consult Note                                  Date: 11/16/2024   Patient Name: Jenny [name]  DOB: 1933-06-23  MRN: [id number]  Age / Sex: 89 y.o., female  PCP: Steva, [facility]  Referring Physician: Kandis Devaughn Sayres, MD  Reason for Consultation: Establishing goals of care  Past Medical History:  Diagnosis Date   ACUT DUOD ULCER W/HEMORR&PERF W/O MENTION OBST 10/05/2009   NSAID induced   ALLERGIC RHINITIS CAUSE UNSPECIFIED    ANEMIA-NOS    CAD (coronary artery disease) 06/08/2009   DEs RCA with 70% LAD and EF 60%   CHF (congestive heart failure) (HCC)    COPD    mild obst on PFTs 03/2010   Diabetes mellitus 06/2010 dx   Mild, diet controlled   GLAUCOMA    HYPERLIPIDEMIA    HYPERTENSION, BENIGN    MYOCARDIAL INFARCTION 06/08/2009   des to rca   Persistent atrial fibrillation (HCC)    Dx 08/2021   TOBACCO ABUSE     Subjective:   This NP Camellia Kays reviewed medical records, received report from team, assessed the patient and then meet at the patient's bedside to discuss diagnosis, prognosis, GOC, EOL wishes disposition and options.  Before meeting with the patient/family, I spent time reviewing the chart notes including admission H&P from 11/14/2024, vascular surgery note from yesterday, family medicine note from yesterday, PT note from yesterday.  Later today I reviewed PT note from today, family medicine note from today, vascular surgery note from today. I also reviewed vital signs, nursing flowsheets, medication administrations record, labs, and imaging. Labs reviewed include CBC which shows normalization of white blood cell count from 11.8 yesterday to 7.7 today in the setting of cellulitis of the lower extremities.  BMP shows interval improvement of creatinine to 1.99 today from 2.09 yesterday in the setting of AKI on CKD 3B with acute on chronic HFpEF.  I met with the patient at bedside,  her son Ozell is present.   We meet to discuss diagnosis prognosis, GOC, EOL wishes, disposition and options. Concept of Palliative Care was introduced as specialized medical care for people and their families living with serious illness.  If focuses on providing relief from the symptoms and stress of a serious illness.  The goal is to improve quality of life for both the patient and the family. Values and goals of care important to patient and family were attempted to be elicited.  Created space and opportunity for patient  and family to explore thoughts and feelings regarding current medical situation   Natural trajectory and current clinical status were discussed. Questions and concerns addressed. Patient  encouraged to call with questions or concerns.    Patient/Family Understanding of Illness: The patient understands her heart rate is normally in the 30s and is now getting into the 100s.  Her son jokes that it is because she is getting angry.  She knows she has kidney problems and heart problems.  She has problems moving her legs since coming into the hospital.  Her son indicates that she has about covered it to their understanding.  He notes that with her heart, legs, kidneys that they are essentially chasing her tail as a treatment 1 tends to worsen the other.  We equated it to playing Wack-a-mole.  We spent time talk about details  of her chronic illnesses including acute on chronic HFpEF, CKD as well as her acute illness including acute exacerbation of HFpEF, AKI, venous stasis with cellulitis and PAD.  Baseline Status: At baseline the patient lives at home, has significant support through home health and [facility].  She was and remains on outpatient palliative care through care connections with hospice and palliative care in the [place].  Today's Discussion: In addition to discussions described above we had extensive discussion on various topics.  We detailed her chronic  illness and how this is impacting her life.  We talked about acute illness that brought her into the hospital.  Her son did also shares that some of the recommendations are to be made that she chooses not to call them.  We had a good discussion about we will have Arrien choices and is important to understand the consequences of these choices.  Patient verbalizes understanding of this.  I shared that choosing to follow advice or not is not right or wrong, as long as she understands what may happen down each of these past.  She agrees.  We shared the benefit of outpatient palliative care ongoing given her heart failure and chronic kidney disease.  We shared that her disease processes will likely continue to deteriorate over time, which she understands.  She knows that outpatient palliative care can continue these important conversations as her health evolves over time and they are both in agreement with this.  We spent time talking about her acute illness, specifically PID with cellulitis and ulcerations of her lower extremity.  We talked about the offer of angiogram +/- intervention.  She has agreed to this with the only concern being the need to lie flat for 2 hours after the procedure as she states that she has difficulty breathing lying flat.  We talked about the I would be able to work around this and address as they are aware.  Finally we talked about advance directives.  Son indicates that he is healthcare power of attorney and patient also verbalized that she would want her son medical to make her decisions related to her healthcare if she could not.  He shares that he will bring in the most recent MOST form that was completed 2 weeks ago as well as HCPOA documentation to be scanned into the system.  She confirms that she is a DNR/DNI.  However, open to other treatment options and full scope of care otherwise.  I shared that I would follow-up in a couple days to allow her time for her procedure and  recovery tomorrow.  I provided contact information for our team for any questions or concerns while she remains admitted. I provided emotional and general support through therapeutic listening, empathy, sharing of stories, and other techniques. I answered all questions and addressed all concerns to the best of my ability.  Goals: DNR/DNI, open to angiogram +/- intervention tomorrow, full scope of care otherwise.  Goal to improve and discharged to rehab and eventually home.  Review of Systems  Constitutional:        Overall feels okay  Respiratory:  Positive for shortness of breath.   Cardiovascular:  Positive for leg swelling.  Gastrointestinal:  Negative for abdominal pain, nausea and vomiting.    Objective:   Primary Diagnoses: Present on Admission:  Acute on chronic heart failure with preserved ejection fraction (HFpEF) (HCC)  COPD with acute exacerbation (HCC)  Stage 3b chronic kidney disease (HCC)  Type II diabetes mellitus  with peripheral circulatory disorder (HCC)  Tachycardia-bradycardia syndrome (HCC)  Bilateral lower leg cellulitis  CAD (coronary artery disease)  Essential hypertension  Type 2 diabetes mellitus with diabetic chronic kidney disease (HCC)  CKD (chronic kidney disease) stage 4, GFR 15-29 ml/min (HCC)  Sepsis (HCC)   Vital Signs:  BP (!) 150/79 (BP Location: Left Arm)   Pulse 69   Temp 97.8 F (36.6 C) (Oral)   Resp (!) 25   Ht 5' 2 (1.575 m)   Wt 92.5 kg   SpO2 93%   BMI 37.31 kg/m   Physical Exam Vitals and nursing note reviewed.  Constitutional:      General: She is not in acute distress.    Appearance: She is obese. She is ill-appearing. She is not toxic-appearing.  HENT:     Head: Normocephalic and atraumatic.  Cardiovascular:     Rate and Rhythm: Normal rate.  Pulmonary:     Effort: Pulmonary effort is normal. No respiratory distress.     Breath sounds: Wheezing present.  Abdominal:     General: Abdomen is flat. There is no  distension.     Palpations: Abdomen is soft.  Skin:    General: Skin is warm and dry.     Comments: Lower extremity extremity erythema areas of ulceration; see pictures from admission for further details  Neurological:     General: No focal deficit present.     Mental Status: She is alert and oriented to person, place, and time.  Psychiatric:        Mood and Affect: Mood normal.        Behavior: Behavior normal.     Palliative Assessment/Data: 30-40%   Advanced Care Planning:   Existing Vynca/ACP Documentation: Updated MOST form, has been VOIDED  Primary Decision Maker: PATIENT  Pertinent diagnosis: Venous stasis, cellulitis, PAD, subacute PE, acute on chronic HFpEF, AKI, CKD 3  The patient and/or family consented to a voluntary Advance Care Planning Conversation in person. Individuals present for the conversation: The patient; patient's son Yessica Putnam; Camellia Kays, NP  Summary of the conversation: We discussed chronic illnesses, progression of chronic illness, acute illness resulting in hospitalization, options moving forward, CODE STATUS  Outcome of the conversations and/or documents completed: DNR/DNI, open to vascular intervention tomorrow, full scope of care.  Son Ozell designated decision maker should the patient be unable to make decisions.  Agreeable to outpatient palliative care at discharge.  I spent 20 minutes providing separately identifiable ACP services with the patient and/or surrogate decision maker in a voluntary, in-person conversation discussing the patient's wishes and goals as detailed in the above note.  Assessment & Plan:   HPI/Patient Profile: 89 y.o. female  with past medical history of HFpEF, hypertension, persistent A-fib on Eliquis , tachybradycardia syndrome, CAD, COPD, CKD stage 3b, type 2 diabetes, anemia who was admitted on 11/14/2024 with lower extremity cellulitis, SIRS, acute dyspnea secondary to COPD and CHF exacerbations, segmental PE, and  others.   Palliative medicine was consulted for GOC conversations.  SUMMARY OF RECOMMENDATIONS   DNR-Limited Full scope of care otherwise Agreeable to vascular intervention tomorrow Agreeable to continued outpatient palliative care discharge Palliative medicine will follow-up in 2 days after her procedure Please notify us  of any significant change or new needs before then  Symptom Management:  Per primary team Palliative medicine is available to assist as needed  Code Status: DNR - Limited (DNR/DNI)  Prognosis:  Unable to determine  Discharge Planning:  Skilled Nursing Facility for rehab  with Palliative care service follow-up   Discussed with: Patient, family, medical team, nursing team    Thank you for allowing us  to participate in the care of RIM THATCH PMT will continue to support holistically.  Time Total: 60 min  Detailed review of medical records (labs, imaging, vital signs), medically appropriate exam, discussed with treatment team, counseling and education to patient, family, & staff, documenting clinical information, medication management, coordination of care  Signed by: Camellia Kays, NP Palliative Medicine Team  Team Phone # [phone] (Nights/Weekends)  11/16/2024, 4:32 PM

## 2024-11-16 NOTE — Evaluation (Signed)
 Occupational Therapy Evaluation Patient Details Name: Jenny [name] MRN: [id number] DOB: 28-May-1933 Today's Date: 11/16/2024   History of Present Illness   Pt is an 89 y/o F admitted on 11/14/24 after presenting with c/o BLE pain, SOB. Pt is being treated for BLE cellulitis, also found to have acute PE. PMH: HFpEF, HTN, persistent a-fib on Eliquis , tachybradycardia syndrome, CAD, COPD, CKD 3B, DM2, anemia     Clinical Impressions Patient was seen for OT evaluation this date. Prior to hospital admission, patient was in SNF, prior to that she was lving home alone; currently unable to return home for many reasons (including functional abilities) but to note, she has a lot of black mold in her home that needs to be remediated, she is very stressed about this. Patient lives alone with intermittent support from neighbors/family. Patient requires min-mod A for bed mobility due to weakness in LLE. She performed sit<>stand with min A with r/w, able to side step up to [facility] with min A. OT facilitated sitting UB HEP for flexibility/trunk rotation (see below) with good tolerance.  Patient on 4L of O2 at start of tx, 99% O2; decreased to 2L, remained above 97% but became dyspneic  and requested O2 be returned to 4L. Patient presents with deficits in standing tolerance, gross strength, affecting safe and optimal ADL completion. Patient is currently requiring mod-max A for LB self care tasks.  Paient would benefit from skilled OT services to address noted impairments and functional limitations (see below for any additional details) in order to maximize safety and independence while minimizing future risk of falls, injury, and readmission. Anticipate the need for follow up OT services upon acute hospital DC.      If plan is discharge home, recommend the following:   A little help with walking and/or transfers;A lot of help with bathing/dressing/bathroom;Assistance with cooking/housework;Assist for  transportation;Help with stairs or ramp for entrance     Functional Status Assessment   Patient has had a recent decline in their functional status and demonstrates the ability to make significant improvements in function in a reasonable and predictable amount of time.     Equipment Recommendations   None recommended by OT     Recommendations for Other Services         Precautions/Restrictions   Precautions Precautions: Fall Recall of Precautions/Restrictions: Intact Restrictions Weight Bearing Restrictions Per Provider Order: No     Mobility Bed Mobility Overal bed mobility: Needs Assistance Bed Mobility: Supine to Sit, Sit to Supine     Supine to sit: Mod assist, HOB elevated, Used rails Sit to supine: Mod assist, Used rails, HOB elevated   General bed mobility comments: A to move LLE off/on bed    Transfers Overall transfer level: Needs assistance Equipment used: Rolling walker (2 wheels) Transfers: Sit to/from Stand Sit to Stand: Min assist           General transfer comment: cues for hand placement and body mechanics with r/w      Balance Overall balance assessment: Needs assistance, History of Falls Sitting-balance support: Feet supported Sitting balance-Leahy Scale: Fair Sitting balance - Comments: prefers UE support, can tolerate without   Standing balance support: During functional activity, Reliant on assistive device for balance, Bilateral upper extremity supported Standing balance-Leahy Scale: Poor Standing balance comment: dependent on BUE support and minA                           ADL either  performed or assessed with clinical judgement   ADL Overall ADL's : Needs assistance/impaired Eating/Feeding: Independent   Grooming: Set up;Sitting   Upper Body Bathing: Set up;Sitting   Lower Body Bathing: Moderate assistance;Sit to/from stand   Upper Body Dressing : Set up;Sitting   Lower Body Dressing: Moderate  assistance;Sit to/from stand   Toilet Transfer: Minimal assistance;Stand-pivot;Rolling walker (2 wheels)   Toileting- Clothing Manipulation and Hygiene: Maximal assistance       Functional mobility during ADLs: Moderate assistance;Rolling walker (2 wheels) General ADL Comments: unable to reach B distal LE to perform dressing/bathing witout A; limited standing tolerance     Vision Baseline Vision/History: 1 Wears glasses;3 Glaucoma;2 Legally blind       Perception         Praxis         Pertinent Vitals/Pain Pain Assessment Pain Assessment: 0-10 Pain Score: 2  Pain Location: L hip Pain Descriptors / Indicators: Discomfort Pain Intervention(s): Monitored during session     Extremity/Trunk Assessment Upper Extremity Assessment Upper Extremity Assessment: Generalized weakness   Lower Extremity Assessment Lower Extremity Assessment: Generalized weakness       Communication Communication Communication: Impaired Factors Affecting Communication: Hearing impaired   Cognition Arousal: Alert Behavior During Therapy: WFL for tasks assessed/performed Cognition: No apparent impairments                               Following commands: Intact       Cueing  General Comments   Cueing Techniques: Verbal cues      Exercises Exercises: Other exercises Other Exercises Other Exercises: trunk mobility: downward reach x 3 second hold, trunk rotation, shoulder circles and scapular retraction   Shoulder Instructions      Home Living Family/patient expects to be discharged to:: Skilled nursing facility Living Arrangements: Alone Available Help at Discharge: Family;Neighbor;Available PRN/intermittently Type of Home: House Home Access: Ramped entrance     Home Layout: One level     Bathroom Shower/Tub: Tub/shower unit;Sponge bathes at baseline   Bathroom Toilet: Handicapped height Bathroom Accessibility: No   Home Equipment: Rollator (4  wheels);BSC/3in1;Tub bench;Grab bars - tub/shower   Additional Comments: Pt from SNF, prior to this pt from home alone, has family but all work full time. very little assistance available      Prior Functioning/Environment Prior Level of Function : Independent/Modified Independent;History of Falls (last six months)             Mobility Comments: Prior to previous admission: reports 3 falls in 3 weeks, mostly sedentary outside of necesary trips to bathroom ADLs Comments: Prior to previous admission: assist from family for IADLs, pt reports only 1 meal per day usually    OT Problem List: Decreased strength;Decreased activity tolerance;Impaired balance (sitting and/or standing);Cardiopulmonary status limiting activity;Obesity   OT Treatment/Interventions: Self-care/ADL training;Therapeutic exercise;Energy conservation;DME and/or AE instruction;Therapeutic activities;Patient/family education;Balance training      OT Goals(Current goals can be found in the care plan section)   Acute Rehab OT Goals Patient Stated Goal: to get stronger OT Goal Formulation: With patient Time For Goal Achievement: 11/12/24 Potential to Achieve Goals: Good ADL Goals Pt Will Perform Grooming: with supervision;standing Pt Will Perform Lower Body Dressing: with supervision;sit to/from stand Pt Will Transfer to Toilet: with supervision;ambulating;bedside commode   OT Frequency:  Min 2X/week    Co-evaluation              AM-PAC OT 6 Clicks Daily  Activity     Outcome Measure Help from another person eating meals?: None Help from another person taking care of personal grooming?: A Little Help from another person toileting, which includes using toliet, bedpan, or urinal?: A Lot Help from another person bathing (including washing, rinsing, drying)?: A Lot Help from another person to put on and taking off regular upper body clothing?: A Little Help from another person to put on and taking off  regular lower body clothing?: A Lot 6 Click Score: 16   End of Session Equipment Utilized During Treatment: Gait belt;Rolling walker (2 wheels);Oxygen Nurse Communication: Mobility status  Activity Tolerance: Patient tolerated treatment well Patient left: in bed;with call bell/phone within reach;with bed alarm set;with family/visitor present  OT Visit Diagnosis: Unsteadiness on feet (R26.81);Other abnormalities of gait and mobility (R26.89);Muscle weakness (generalized) (M62.81);History of falling (Z91.81)                Time: 8487-8460 OT Time Calculation (min): 27 min Charges:  OT General Charges $OT Visit: 1 Visit OT Evaluation $OT Eval Low Complexity: 1 Low OT Treatments $Self Care/Home Management : 8-22 mins  Rogers Clause, OT/L MSOT, 11/16/2024

## 2024-11-16 NOTE — [place] FL2 (Signed)
 [place]  MEDICAID FL2 LEVEL OF CARE FORM     IDENTIFICATION  Patient Name: Jenny [name] Birthdate: 1933/08/31 Sex: female Admission Date (Current Location): 11/14/2024  [facility] and [place] Number:  Producer, Television/film/video and Address:  [facility], [address], [place], [place] [address]      Provider Number: [id number]  Attending Physician Name and Address:  Kandis Devaughn Sayres, MD  Relative Name and Phone Number:       Current Level of Care: Hospital Recommended Level of Care: Skilled Nursing Facility Prior Approval Number:    Date Approved/Denied:   PASRR Number: [id number]  Discharge Plan: SNF    Current Diagnoses: Patient Active Problem List   Diagnosis Date Noted   PAD (peripheral artery disease) 11/15/2024   Acute pulmonary embolism (HCC) 11/14/2024   Permanent atrial fibrillation (HCC) 11/14/2024   Obesity, Class III, BMI 40-49.9 (morbid obesity) (HCC) 11/14/2024   Sepsis (HCC) 11/14/2024   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) 10/28/2024   Respiratory distress 09/04/2024   CKD (chronic kidney disease) stage 4, GFR 15-29 ml/min (HCC) 09/03/2024   GERD (gastroesophageal reflux disease) 09/03/2024   History of atrial fibrillation 09/03/2024   Acute respiratory failure (HCC) 09/02/2024   Hyponatremia 09/02/2024   Hypokalemia 09/02/2024   AKI (acute kidney injury) 07/02/2024   Pulmonary hypertension, unspecified (HCC) 07/02/2024   Shortness of breath 07/02/2024   Acute dyspnea 07/01/2024   Right hip pain 06/21/2024   SOB (shortness of breath) 06/17/2024   COPD with acute exacerbation (HCC) 06/16/2024   Other secondary pulmonary hypertension (HCC) 10/14/2023   Junctional rhythm 09/27/2023   Acute on chronic heart failure with preserved ejection fraction (HFpEF) (HCC) 09/26/2023   Symptomatic bradycardia 09/26/2023   Pain due to onychomycosis of toenails of both feet 05/27/2023   Type II diabetes mellitus  with peripheral circulatory disorder (HCC) 05/27/2023   Tachycardia-bradycardia syndrome (HCC) 03/28/2022   Stage 3b chronic kidney disease (HCC) 02/21/2022   Type 2 diabetes mellitus with diabetic chronic kidney disease (HCC) 02/21/2022   Normocytic anemia 02/21/2022   Chronic obstructive pulmonary disease, unspecified (HCC) 02/21/2022   Edema, unspecified 02/21/2022   Hypertension 02/21/2022   Congestive heart failure (HCC) 02/21/2022   Other and unspecified hyperlipidemia 02/21/2022   Old myocardial infarction 02/21/2022   PAF (paroxysmal atrial fibrillation) (HCC) 02/21/2022   Heart failure (HCC) 11/04/2021   Acute on chronic diastolic CHF (congestive heart failure) (HCC) 11/03/2021   Secondary hypercoagulable state 10/23/2021   Persistent atrial fibrillation (HCC) 10/11/2021   Bilateral lower leg cellulitis 08/22/2021   Obese    Dyspepsia 04/05/2011   Allergic rhinitis 10/16/2010   COPD (chronic obstructive pulmonary disease) (HCC) 05/21/2010   Essential hypertension 04/06/2010   EDEMA 03/09/2010   ANEMIA-NOS 10/11/2009   Unspecified glaucoma 10/11/2009   CAD (coronary artery disease) 10/03/2009   HLD (hyperlipidemia) 07/12/2009   TOBACCO ABUSE 07/12/2009   Acute myocardial infarction (HCC) 07/12/2009   Diabetes mellitus 07/12/2009    Orientation RESPIRATION BLADDER Height & Weight     Self, Time, Situation, Place  O2 (Nasal Cannula 2 L) Incontinent Weight: 204 lb (92.5 kg) Height:  5' 2 (157.5 cm)  BEHAVIORAL SYMPTOMS/MOOD NEUROLOGICAL BOWEL NUTRITION STATUS   (None)  (None) Continent Diet (Heart healthy/carb modified.)  AMBULATORY STATUS COMMUNICATION OF NEEDS Skin   Extensive Assist Verbally Skin abrasions, Bruising, Other (Comment) (Erythema/redness, scratch marks. Wound on right arm: Foam. Wound on both pretibials: Gauze, compression wrap, xeroform.)  Personal Care Assistance Level of Assistance  Bathing, Feeding, Dressing Bathing  Assistance: Maximum assistance Feeding assistance: Limited assistance Dressing Assistance: Maximum assistance     Functional Limitations Info  Sight, Hearing, Speech Sight Info: Adequate Hearing Info: Adequate Speech Info: Adequate    SPECIAL CARE FACTORS FREQUENCY  PT (By licensed PT)     PT Frequency: 5 x week              Contractures Contractures Info: Not present    Additional Factors Info  Code Status, Allergies Code Status Info: DNR Allergies Info: Aspirin , Entresto  (Sacubitril -valsartan ), Atorvastatin , Cyclobenzaprine, Lactose Intolerance (Gi), Meperidine Hcl, Pravastatin , Propoxyphene, Wound Dressing Adhesive           Current Medications (11/16/2024):  This is the current hospital active medication list Current Facility-Administered Medications  Medication Dose Route Frequency Provider Last Rate Last Admin   acetaminophen  (TYLENOL ) tablet 650 mg  650 mg Oral Q6H PRN Duncan, Hazel V, MD       Or   acetaminophen  (TYLENOL ) suppository 650 mg  650 mg Rectal Q6H PRN Duncan, Hazel V, MD       albuterol  (PROVENTIL ) (2.5 MG/3ML) 0.083% nebulizer solution 2.5 mg  2.5 mg Nebulization Q2H PRN Cleatus Delayne GAILS, MD       arformoterol  (BROVANA ) nebulizer solution 15 mcg  15 mcg Nebulization BID Kandis Devaughn Sayres, MD   15 mcg at 11/16/24 0705   And   umeclidinium bromide  (INCRUSE ELLIPTA ) 62.5 MCG/ACT 1 puff  1 puff Inhalation Daily Kandis Devaughn Sayres, MD   1 puff at 11/16/24 9173   cephALEXin  (KEFLEX ) capsule 500 mg  500 mg Oral Q8H Wouk, Devaughn Sayres, MD       dorzolamide  (TRUSOPT ) 2 % ophthalmic solution 1 drop  1 drop Both Eyes BID Cleatus Delayne V, MD   1 drop at 11/16/24 9045   furosemide  (LASIX ) tablet 40 mg  40 mg Oral Daily Wouk, Devaughn Sayres, MD       gabapentin  (NEURONTIN ) capsule 100 mg  100 mg Oral Daily Duncan, Hazel V, MD   100 mg at 11/16/24 0954   guaiFENesin  (MUCINEX ) 12 hr tablet 600 mg  600 mg Oral BID Duncan, Hazel V, MD   600 mg at 11/16/24 0954    heparin  ADULT infusion 100 units/mL (25000 units/250mL)  1,100 Units/hr Intravenous Continuous Clair Marolyn NOVAK, RPH 11 mL/hr at 11/16/24 0823 1,100 Units/hr at 11/16/24 9176   hydrocerin (EUCERIN) cream   Topical BID Kandis Devaughn Sayres, MD   Given at 11/16/24 0957   hydrOXYzine  (ATARAX ) tablet 25 mg  25 mg Oral Q8H PRN Duncan, Hazel V, MD   25 mg at 11/15/24 2135   insulin  aspart (novoLOG ) injection 0-20 Units  0-20 Units Subcutaneous TID WC Duncan, Hazel V, MD   3 Units at 11/16/24 9173   insulin  aspart (novoLOG ) injection 0-5 Units  0-5 Units Subcutaneous QHS Cleatus Delayne GAILS, MD   2 Units at 11/15/24 2134   latanoprost  (XALATAN ) 0.005 % ophthalmic solution 1 drop  1 drop Both Eyes QHS Cleatus Delayne V, MD   1 drop at 11/15/24 2136   ondansetron  (ZOFRAN ) tablet 4 mg  4 mg Oral Q6H PRN Duncan, Hazel V, MD       Or   ondansetron  (ZOFRAN ) injection 4 mg  4 mg Intravenous Q6H PRN Duncan, Hazel V, MD       Oral care mouth rinse  15 mL Mouth Rinse PRN Wouk, Devaughn Sayres, MD  oxyCODONE  (Oxy IR/ROXICODONE ) immediate release tablet 5 mg  5 mg Oral Q4H PRN Duncan, Hazel V, MD   5 mg at 11/15/24 1616   pantoprazole  (PROTONIX ) EC tablet 40 mg  40 mg Oral Daily Duncan, Hazel V, MD   40 mg at 11/16/24 0954   [START ON 11/17/2024] predniSONE  (DELTASONE ) tablet 10 mg  10 mg Oral Q breakfast Wouk, Devaughn Sayres, MD       rosuvastatin  (CRESTOR ) tablet 20 mg  20 mg Oral Daily Kandis Devaughn Sayres, MD   20 mg at 11/16/24 9045     Discharge Medications: Please see discharge summary for a list of discharge medications.  Relevant Imaging Results:  Relevant Lab Results:   Additional Information SS#: [id number]. Went to [facility] on 11/1 for about 3 days. Went to [facility] on 11/25 until hospital admission on 12/7. Wants to find another facility. Has secondary insurance.  Lauraine JAYSON Carpen, LCSW

## 2024-11-17 ENCOUNTER — Encounter
Admission: EM | Disposition: A | Discharge status: Skilled Nursing Facility | Payer: Self-pay | Source: Home / Self Care | Attending: Internal Medicine

## 2024-11-17 ENCOUNTER — Encounter: Payer: Self-pay | Admitting: Vascular Surgery

## 2024-11-17 HISTORY — PX: LOWER EXTREMITY ANGIOGRAPHY: CATH118251

## 2024-11-17 LAB — GLUCOSE, CAPILLARY
Glucose-Capillary: 54 mg/dL — ABNORMAL LOW (ref 70–99)
Glucose-Capillary: 65 mg/dL — ABNORMAL LOW (ref 70–99)
Glucose-Capillary: 70 mg/dL (ref 70–99)
Glucose-Capillary: 155 mg/dL — ABNORMAL HIGH (ref 70–99)
Glucose-Capillary: 151 mg/dL — ABNORMAL HIGH (ref 70–99)
Glucose-Capillary: 227 mg/dL — ABNORMAL HIGH (ref 70–99)
Glucose-Capillary: 201 mg/dL — ABNORMAL HIGH (ref 70–99)

## 2024-11-17 LAB — CBC
HCT: 28.5 % — ABNORMAL LOW (ref 36.0–46.0)
Hemoglobin: 8.4 g/dL — ABNORMAL LOW (ref 12.0–15.0)
MCH: 25.5 pg — ABNORMAL LOW (ref 26.0–34.0)
MCHC: 29.5 g/dL — ABNORMAL LOW (ref 30.0–36.0)
MCV: 86.6 fL (ref 80.0–100.0)
Platelets: 242 K/uL (ref 150–400)
RBC: 3.29 MIL/uL — ABNORMAL LOW (ref 3.87–5.11)
RDW: 19.5 % — ABNORMAL HIGH (ref 11.5–15.5)
WBC: 5.9 K/uL (ref 4.0–10.5)
nRBC: 3 % — ABNORMAL HIGH (ref 0.0–0.2)

## 2024-11-17 LAB — HEPARIN LEVEL (UNFRACTIONATED)
Heparin Unfractionated: 1.01 [IU]/mL — ABNORMAL HIGH (ref 0.30–0.70)
Heparin Unfractionated: 0.73 [IU]/mL — ABNORMAL HIGH (ref 0.30–0.70)

## 2024-11-17 LAB — APTT
aPTT: 117 s — ABNORMAL HIGH (ref 24–36)
aPTT: 69 s — ABNORMAL HIGH (ref 24–36)
aPTT: 65 s — ABNORMAL HIGH (ref 24–36)

## 2024-11-17 MED ORDER — FAMOTIDINE 20 MG PO TABS: 40.0000 mg | ORAL_TABLET | Freq: Once | ORAL | Status: DC | PRN

## 2024-11-17 MED ORDER — MIDAZOLAM HCL 2 MG/ML PO SYRP
8.0000 mg | ORAL_SOLUTION | Freq: Once | ORAL | Status: DC | PRN
  Filled 2024-11-17: qty 5

## 2024-11-17 MED ORDER — FENTANYL CITRATE (PF) 100 MCG/2ML IJ SOLN
INTRAMUSCULAR | Status: AC
  Filled 2024-11-17: qty 2

## 2024-11-17 MED ORDER — DIPHENHYDRAMINE HCL 50 MG/ML IJ SOLN: 50.0000 mg | Freq: Once | INTRAMUSCULAR | Status: DC | PRN

## 2024-11-17 MED ORDER — MIDAZOLAM HCL (PF) 2 MG/2ML IJ SOLN
INTRAMUSCULAR | Status: DC | PRN
  Administered 2024-11-17: .5 mg via INTRAVENOUS

## 2024-11-17 MED ORDER — CEFAZOLIN SODIUM-DEXTROSE 2-4 GM/100ML-% IV SOLN
INTRAVENOUS | Status: AC
  Filled 2024-11-17: qty 100

## 2024-11-17 MED ORDER — HEPARIN SODIUM (PORCINE) 1000 UNIT/ML IJ SOLN
INTRAMUSCULAR | Status: DC | PRN
  Administered 2024-11-17: 4000 [IU] via INTRAVENOUS

## 2024-11-17 MED ORDER — MIDAZOLAM HCL 2 MG/2ML IJ SOLN
INTRAMUSCULAR | Status: AC
  Filled 2024-11-17: qty 2

## 2024-11-17 MED ORDER — SODIUM CHLORIDE 0.9 % IV BOLUS
250.0000 mL | Freq: Once | INTRAVENOUS | Status: AC
  Administered 2024-11-17: 250 mL via INTRAVENOUS

## 2024-11-17 MED ORDER — FENTANYL CITRATE (PF) 100 MCG/2ML IJ SOLN
INTRAMUSCULAR | Status: DC | PRN
  Administered 2024-11-17 (×2): 25 ug via INTRAVENOUS

## 2024-11-17 MED ORDER — SODIUM CHLORIDE 0.9 % IV SOLN: INTRAVENOUS | Status: DC

## 2024-11-17 MED ORDER — IODIXANOL 320 MG/ML IV SOLN
INTRAVENOUS | Status: DC | PRN
  Administered 2024-11-17: 50 mL via INTRA_ARTERIAL

## 2024-11-17 MED ORDER — DEXTROSE 50 % IV SOLN
25.0000 mL | Freq: Once | INTRAVENOUS | Status: AC
  Administered 2024-11-17: 25 mL via INTRAVENOUS

## 2024-11-17 MED ORDER — CEFAZOLIN SODIUM-DEXTROSE 2-4 GM/100ML-% IV SOLN
2.0000 g | INTRAVENOUS | Status: AC
  Administered 2024-11-17: 2 g via INTRAVENOUS
  Filled 2024-11-17: qty 100

## 2024-11-17 MED ORDER — DEXTROSE 50 % IV SOLN
INTRAVENOUS | Status: AC
  Filled 2024-11-17: qty 50

## 2024-11-17 MED ORDER — METHYLPREDNISOLONE SODIUM SUCC 125 MG IJ SOLR: 125.0000 mg | Freq: Once | INTRAMUSCULAR | Status: DC | PRN

## 2024-11-17 MED ORDER — HEPARIN (PORCINE) IN NACL 1000-0.9 UT/500ML-% IV SOLN
INTRAVENOUS | Status: DC | PRN
  Administered 2024-11-17: 1000 mL

## 2024-11-17 MED ORDER — LIDOCAINE-EPINEPHRINE (PF) 1 %-1:200000 IJ SOLN
INTRAMUSCULAR | Status: DC | PRN
  Administered 2024-11-17: 10 mL via INTRADERMAL

## 2024-11-17 MED ORDER — HEPARIN SODIUM (PORCINE) 1000 UNIT/ML IJ SOLN
INTRAMUSCULAR | Status: AC
  Filled 2024-11-17: qty 10

## 2024-11-17 NOTE — Progress Notes (Signed)
 PHARMACY - ANTICOAGULATION CONSULT NOTE  Pharmacy Consult for Heparin   Indication: pulmonary embolus  Patient Measurements: Height: 5' 2 (157.5 cm) Weight: 92.5 kg (204 lb) IBW/kg (Calculated) : 50.1 HEPARIN  DW (KG): 71.4  Labs: Recent Labs    11/14/24 0354 11/14/24 1806 11/15/24 0509 11/15/24 1248 11/15/24 1410 11/15/24 2309 11/16/24 0649 11/16/24 1623 11/17/24 0155  HGB  --   --  8.7*  --   --   --  8.5*  --   --   HCT  --   --  28.9*  --   --   --  28.3*  --   --   PLT  --   --  209  --   --   --  166  --   --   APTT 41*   < > 97*   < >  --    < > 54* 121* 117*  LABPROT 16.5*  --   --   --   --   --   --   --   --   INR 1.3*  --   --   --   --   --   --   --   --   HEPARINUNFRC >1.10*  --  >1.10*  --   --   --  0.96*  --  1.01*  CREATININE  --   --  2.00*  --  2.09*  --  1.99*  --   --    < > = values in this interval not displayed.   Estimated Creatinine Clearance: 20.3 mL/min (A) (by C-G formula based on SCr of 1.99 mg/dL (H)).  Medical History: Past Medical History:  Diagnosis Date   ACUT DUOD ULCER W/HEMORR&PERF W/O MENTION OBST 10/05/2009   NSAID induced   ALLERGIC RHINITIS CAUSE UNSPECIFIED    ANEMIA-NOS    CAD (coronary artery disease) 06/08/2009   DEs RCA with 70% LAD and EF 60%   CHF (congestive heart failure) (HCC)    COPD    mild obst on PFTs 03/2010   Diabetes mellitus 06/2010 dx   Mild, diet controlled   GLAUCOMA    HYPERLIPIDEMIA    HYPERTENSION, BENIGN    MYOCARDIAL INFARCTION 06/08/2009   des to rca   Persistent atrial fibrillation (HCC)    Dx 08/2021   TOBACCO ABUSE    Assessment: Pharmacy consulted to dose heparin  in this 89 year old female admitted with PE.  Pt was on Eliquis  2.5 mg PO BID PTA, last dose on 12/6 @ 2100.  Goal of Therapy:  Heparin  level 0.3-0.7 units/ml aPTT 66 - 102  seconds Monitor platelets by anticoagulation protocol: Yes   12/9 1623 aPTT 121s, supratherapeutic @ 1100 u/hr 12/10 0155 aPTT 117s, supratherapeutic  / HL 1.01  Plan:  aPTT supratherapeutic Decrease heparin  infusion to 850 units/hr Recheck aPTT 8 hours after rate change and HL with AM labs Adjust based on aPTT until correlation with HL CBC daily while on heparin   Rankin [name], PharmD, [facility] 11/17/2024 2:43 AM

## 2024-11-17 NOTE — Progress Notes (Signed)
 PHARMACY - ANTICOAGULATION CONSULT NOTE  Pharmacy Consult for IV Heparin   Indication: pulmonary embolus  Patient Measurements: Height: 5' 2 (157.5 cm) Weight: 94.5 kg (208 lb 5.4 oz) IBW/kg (Calculated) : 50.1 HEPARIN  DW (KG): 71.4  Labs: Recent Labs    11/15/24 0509 11/15/24 1248 11/15/24 1410 11/15/24 2309 11/16/24 0649 11/16/24 1623 11/17/24 0155 11/17/24 0452 11/17/24 1052 11/17/24 2223  HGB 8.7*  --   --   --  8.5*  --   --  8.4*  --   --   HCT 28.9*  --   --   --  28.3*  --   --  28.5*  --   --   PLT 209  --   --   --  166  --   --  242  --   --   APTT 97*   < >  --    < > 54*   < > 117*  --  69* 65*  HEPARINUNFRC >1.10*  --   --   --  0.96*  --  1.01*  --   --  0.73*  CREATININE 2.00*  --  2.09*  --  1.99*  --   --   --   --   --    < > = values in this interval not displayed.   Estimated Creatinine Clearance: 20.5 mL/min (A) (by C-G formula based on SCr of 1.99 mg/dL (H)).  Medical History: Past Medical History:  Diagnosis Date   ACUT DUOD ULCER W/HEMORR&PERF W/O MENTION OBST 10/05/2009   NSAID induced   ALLERGIC RHINITIS CAUSE UNSPECIFIED    ANEMIA-NOS    CAD (coronary artery disease) 06/08/2009   DEs RCA with 70% LAD and EF 60%   CHF (congestive heart failure) (HCC)    COPD    mild obst on PFTs 03/2010   Diabetes mellitus 06/2010 dx   Mild, diet controlled   GLAUCOMA    HYPERLIPIDEMIA    HYPERTENSION, BENIGN    MYOCARDIAL INFARCTION 06/08/2009   des to rca   Persistent atrial fibrillation (HCC)    Dx 08/2021   TOBACCO ABUSE    Assessment: Pharmacy consulted to dose heparin  in this 89 year old female admitted with PE.  Pt was on Eliquis  2.5 mg PO BID PTA, last dose on 12/6 @ 2100.  Goal of Therapy:  Heparin  level 0.3-0.7 units/ml aPTT 66 - 102  seconds Monitor platelets by anticoagulation protocol: Yes   12/9 1623 aPTT 121s, supratherapeutic @ 1100 u/hr 12/10 0155 aPTT 117s, supratherapeutic / HL 1.01 12/10 1052 aPTT 69s, therapeutic x  1 12/10 2223 aPTT 0.65 HL 0.73.   Plan:  aPTT is slightly supratherapeutic. Will increase heparin  infusion to 900 units/hr. Recheck heparin  level 12/11 AM. CBC daily.   Cathaleen GORMAN Blanch 11/17/2024 11:36 PM

## 2024-11-17 NOTE — Interval H&P Note (Signed)
 History and Physical Interval Note:  11/17/2024 11:25 AM  Jenny [name]  has presented today for surgery, with the diagnosis of PAD.  The various methods of treatment have been discussed with the patient and family. After consideration of risks, benefits and other options for treatment, the patient has consented to  Procedure(s): Lower Extremity Angiography (Right) as a surgical intervention.  The patient's history has been reviewed, patient examined, no change in status, stable for surgery.  I have reviewed the patient's chart and labs.  Questions were answered to the patient's satisfaction.     Anjelo Pullman

## 2024-11-17 NOTE — Progress Notes (Signed)
 PROGRESS NOTE    Jenny [name]   FMW:[id number] DOB: 12/21/1932  DOA: 11/14/2024 Date of Service: 11/17/24 which is hospital day 3  PCP: [facility]     Hospital course / significant events:   HPI: Jenny [name] is a 89 y.o. female with medical history significant for HFpEF, hypertension, persistent A-fib on Eliquis , tachybradycardia syndrome, CAD, COPD, CKD stage 3b, type 2 diabetes, anemia. Presents to ED from [facility] (rehab), 3 days LE edema and R leg pain 1 day, also SOB  12/07: to ED early hours AM. CTA chest showed tiny segmental PE left lower lobe without heart strain, extensive multivessel CAD and mild asymmetric pulmonary edema around the left hilum. Initially was treated with DuoNebs and Solu-Medrol . She initially received LR boluses but subsequently given a dose of Lasix  as test results came in. Started on vancomycin  and Rocephin  and also on a heparin  infusion for PE. Admitted with question sepsis d/t lower extremity cellulitis, vs non-infectious SIRS multifactorial including acute dyspnea secondary to COPD and CHF exacerbation with a segmental PE seen on CTA chest. Podiatry consulted re: venous stasis ulcers on feet - bedisde debridement, appear superficial and recs for daily dressing changes, ACE wraps to knee, po abx on dc, WBAT, f/u outpatient 1 week after discharge   12/08: Vascular surgery consult d/t decreased ABI / PE> No thrombectomy given small PE. RLE angio planned for 12/10 12/09: stable 12/10: RLE angio and stents today      Consultants:  Podiatry Vascular Surgery  Palliative Care   Procedures/Surgeries: RLE angiogram and stents 11/17/24       ASSESSMENT & PLAN:   LE edema / erythema  Multifactorial, include Venous stasis dermatitis / ulceration  Cellulitis  Peripheral arterial disease LE edema d/t CHF / HFpEF exacerbation  Treat underlying cause(s) as noted   Venous stasis with associated stasis ulcers  lower extremities bilaterally peripheral arterial disease  S/p angiogram RLE w/ stenting 11/17/24 Vascular surgery following daily dressing changes, ACE wraps to knee po abx on dc WBAT f/u podiatry outpatient 1 week after discharge    Question Cellulitis Continue Keflex     Acute PE Very small.  On apixaban  2.5 bid at baseline, patient unsure if she's been receiving regularly at snf. Given cr under 1.5 assumes stays there would warrant full dose of 5 bid heparin  for now pending vascular intervention   HFpEF with acute exacerbation Recent hospitalization with dose reduction in diuretic, likely under-diuresed (discharged on lasix  40, had previously been on bid dosing). Cr up from admission after IV diuresis, but stable lasix  40 mg oral daily - titrate as able /as needed    Hyperkalemia K 5.3 treated with lokelma  improved Monitor BMP   AKI on ckd 3b Cr 2 from 1.39 on admission  resuming oral lasix  yesterday, would need to hold if kidney function worsens   COPD  not exacerbated.  Arrives on prednisone  20 and says has been maintained on that for a couple of months decrease prednisone  to 10 daily, at that dose doesn't need pjp ppx will need close outpt pulm f/u   History tachy-brady A-fib Rate controlled currently heparin  as above Resume eliquis  on discharge as above    Elevated troponin, ACS ruled out  Mild, stable, flat, likely demand as no chest pain Recheck troponin / EKG prn chest pain.    Neuropathy home gabapentin    Pulm nodule outpt f/u   Debility Comes from rehab PT consulted patient does not want  to return to [facility]. TOC aware, looking elsewhere but if refusing available facility then will have to DC to home instead if unable to arrange alternative placement     Class 2 / borderline Class 3 obesity based on BMI: Body mass index is 38.1 kg/m.[name] Significantly low or high BMI is associated with higher medical risk.  Underweight - under 18   overweight - 25 to 29 obese - 30 or more Class 1 obesity: BMI of 30.0 to 34 Class 2 obesity: BMI of 35.0 to 39 Class 3 obesity: BMI of 40.0 to 49 Super Morbid Obesity: BMI 50-59 Super-super Morbid Obesity: BMI 60+ Healthy nutrition and physical activity advised as adjunct to other disease management and risk reduction treatments    DVT prophylaxis: heparin  IV fluids: no continuous IV fluids  Nutrition: cardiac diet  Central lines / other devices: none  Code Status: DNR ACP documentation reviewed:  none on file in [facility]  [facility] needs: SNF rehab placement Medical barriers to dispo: recovery post angio. Expected medical readiness for discharge tomorrow .              Subjective / Brief ROS:  Patient reports feelin gwell following procedure  Denies CP/SOB.  Pain controlled.  Denies new weakness.  Tolerating diet.  Reports no concerns w/ urination/defecation.   Family Communication: none at this time     Objective Findings:  Vitals:   11/17/24 1315 11/17/24 1330 11/17/24 1345 11/17/24 1541  BP: (!) 116/53 123/63 124/60 118/64  Pulse: (!) 56 64  76  Resp: 16 15  19   Temp:  (!) 96.8 F (36 C) 97.7 F (36.5 C) 97.8 F (36.6 C)  TempSrc:  Temporal    SpO2: 96% 96%    Weight:      Height:        Intake/Output Summary (Last 24 hours) at 11/17/2024 1729 Last data filed at 11/17/2024 1645 Gross per 24 hour  Intake 111.71 ml  Output 3500 ml  Net -3388.29 ml   Filed Weights   11/15/24 0409 11/16/24 0500 11/17/24 0500  Weight: 93.8 kg 92.5 kg 94.5 kg    Examination:  Physical Exam Constitutional:      General: She is not in acute distress. Cardiovascular:     Rate and Rhythm: Normal rate and regular rhythm.  Pulmonary:     Effort: Pulmonary effort is normal.     Breath sounds: Normal breath sounds.  Skin:    General: Skin is warm and dry.  Neurological:     Mental Status: She is alert.  Psychiatric:        Mood and Affect: Mood normal.         Behavior: Behavior normal.          Scheduled Medications:   arformoterol   15 mcg Nebulization BID   And   umeclidinium bromide   1 puff Inhalation Daily   cephALEXin   500 mg Oral Q8H   dorzolamide   1 drop Both Eyes BID   furosemide   40 mg Oral Daily   gabapentin   100 mg Oral Daily   guaiFENesin   600 mg Oral BID   hydrocerin   Topical BID   latanoprost   1 drop Both Eyes QHS   pantoprazole   40 mg Oral Daily   predniSONE   10 mg Oral Q breakfast   rosuvastatin   20 mg Oral Daily    Continuous Infusions:  heparin  850 Units/hr (11/17/24 1352)    PRN Medications:  acetaminophen  **OR** acetaminophen , albuterol , hydrOXYzine , ondansetron  **  OR** ondansetron  (ZOFRAN ) IV, mouth rinse, oxyCODONE   Antimicrobials from admission:  Anti-infectives (From admission, onward)    Start     Dose/Rate Route Frequency Ordered Stop   11/17/24 1112  ceFAZolin  (ANCEF ) IVPB 2g/100 mL premix        2 g 200 mL/hr over 30 Minutes Intravenous 30 min pre-op 11/17/24 1112 11/17/24 1257   11/16/24 1400  cephALEXin  (KEFLEX ) capsule 500 mg        500 mg Oral Every 8 hours 11/16/24 1157     11/16/24 0200  vancomycin  (VANCOREADY) IVPB 1250 mg/250 mL  Status:  Discontinued        1,250 mg 166.7 mL/hr over 90 Minutes Intravenous Every 48 hours 11/14/24 0522 11/14/24 0901   11/15/24 0130  cefTRIAXone  (ROCEPHIN ) 1 g in sodium chloride  0.9 % 100 mL IVPB  Status:  Discontinued        1 g 200 mL/hr over 30 Minutes Intravenous Every 24 hours 11/14/24 0448 11/14/24 0901   11/14/24 2200  ceFAZolin  (ANCEF ) IVPB 1 g/50 mL premix  Status:  Discontinued        1 g 100 mL/hr over 30 Minutes Intravenous Every 8 hours 11/14/24 0920 11/16/24 1157   11/14/24 1400  ceFAZolin  (ANCEF ) IVPB 1 g/50 mL premix  Status:  Discontinued        1 g 100 mL/hr over 30 Minutes Intravenous Every 8 hours 11/14/24 0919 11/14/24 0920   11/14/24 0500  vancomycin  (VANCOREADY) IVPB 1500 mg/300 mL        1,500 mg 150 mL/hr over 120 Minutes  Intravenous  Once 11/14/24 0457 11/14/24 0805   11/14/24 0115  cefTRIAXone  (ROCEPHIN ) 2 g in sodium chloride  0.9 % 100 mL IVPB        2 g 200 mL/hr over 30 Minutes Intravenous Once 11/14/24 0108 11/14/24 0201   11/14/24 0115  vancomycin  (VANCOCIN ) IVPB 1000 mg/200 mL premix        1,000 mg 200 mL/hr over 60 Minutes Intravenous  Once 11/14/24 0108 11/14/24 0319           Data Reviewed:  I have personally reviewed the following...  CBC: Recent Labs  Lab 11/14/24 0022 11/15/24 0509 11/16/24 0649 11/17/24 0452  WBC 10.7* 11.8* 7.7 5.9  NEUTROABS 9.4*  --   --   --   HGB 9.8* 8.7* 8.5* 8.4*  HCT 32.6* 28.9* 28.3* 28.5*  MCV 85.8 84.8 84.5 86.6  PLT 239 209 166 242   Basic Metabolic Panel: Recent Labs  Lab 11/14/24 0022 11/15/24 0509 11/15/24 1410 11/16/24 0649  NA 135 134* 133* 133*  K 5.1 5.3* 5.4* 4.9  CL 98 96* 95* 97*  CO2 25 24 22 25   GLUCOSE 127* 174* 184* 128*  BUN 43* 48* 50* 52*  CREATININE 1.39* 2.00* 2.09* 1.99*  CALCIUM  8.5* 8.7* 8.9 8.8*   GFR: Estimated Creatinine Clearance: 20.5 mL/min (A) (by C-G formula based on SCr of 1.99 mg/dL (H)). Liver Function Tests: Recent Labs  Lab 11/14/24 0022  AST 17  ALT 20  ALKPHOS 129*  BILITOT 0.3  PROT 6.1*  ALBUMIN 3.7   No results for input(s): LIPASE, AMYLASE in the last 168 hours. No results for input(s): AMMONIA in the last 168 hours. Coagulation Profile: Recent Labs  Lab 11/14/24 0354  INR 1.3*   Cardiac Enzymes: No results for input(s): CKTOTAL, CKMB, CKMBINDEX, TROPONINI in the last 168 hours. BNP (last 3 results) Recent Labs    11/14/24 0022  PROBNP 2,277.0*  HbA1C: No results for input(s): HGBA1C in the last 72 hours. CBG: Recent Labs  Lab 11/17/24 1044 11/17/24 1127 11/17/24 1304 11/17/24 1343 11/17/24 1644  GLUCAP 65* 70 155* 151* 227*   Lipid Profile: No results for input(s): CHOL, HDL, LDLCALC, TRIG, CHOLHDL, LDLDIRECT in the last 72  hours. Thyroid  Function Tests: No results for input(s): TSH, T4TOTAL, FREET4, T3FREE, THYROIDAB in the last 72 hours. Anemia Panel: No results for input(s): VITAMINB12, FOLATE, FERRITIN, TIBC, IRON , RETICCTPCT in the last 72 hours. Most Recent Urinalysis On File:     Component Value Date/Time   COLORURINE YELLOW 10/06/2024 2103   APPEARANCEUR CLEAR 10/06/2024 2103   LABSPEC 1.008 10/06/2024 2103   PHURINE 6.0 10/06/2024 2103   GLUCOSEU NEGATIVE 10/06/2024 2103   HGBUR MODERATE (A) 10/06/2024 2103   BILIRUBINUR NEGATIVE 10/06/2024 2103   KETONESUR NEGATIVE 10/06/2024 2103   PROTEINUR NEGATIVE 10/06/2024 2103   NITRITE NEGATIVE 10/06/2024 2103   LEUKOCYTESUR NEGATIVE 10/06/2024 2103   Sepsis Labs: @LABRCNTIP (procalcitonin:4,lacticidven:4) Microbiology: Recent Results (from the past 240 hours)  Blood culture (routine x 2)     Status: None (Preliminary result)   Collection Time: 11/14/24  1:22 AM   Specimen: BLOOD  Result Value Ref Range Status   Specimen Description BLOOD RIGHT ANTECUBITAL  Final   Special Requests   Final    BOTTLES DRAWN AEROBIC AND ANAEROBIC Blood Culture results may not be optimal due to an inadequate volume of blood received in culture bottles   Culture   Final    NO GROWTH 3 DAYS Performed at [facility], [address], [place], [place] [address]    Report Status PENDING  Incomplete  Blood culture (routine x 2)     Status: None (Preliminary result)   Collection Time: 11/14/24  1:22 AM   Specimen: BLOOD  Result Value Ref Range Status   Specimen Description BLOOD BLOOD LEFT FOREARM  Final   Special Requests   Final    BOTTLES DRAWN AEROBIC AND ANAEROBIC Blood Culture adequate volume   Culture   Final    NO GROWTH 3 DAYS Performed at [facility], [address] [name], [place] [address]    Report Status PENDING  Incomplete  Resp panel by RT-PCR (RSV, Flu A&B, Covid) Anterior Nasal Swab     Status: None    Collection Time: 11/14/24  1:22 AM   Specimen: Anterior Nasal Swab  Result Value Ref Range Status   SARS Coronavirus 2 by RT PCR NEGATIVE NEGATIVE Final    Comment: (NOTE) SARS-CoV-2 target nucleic acids are NOT DETECTED.  The SARS-CoV-2 RNA is generally detectable in upper respiratory specimens during the acute phase of infection. The lowest concentration of SARS-CoV-2 viral copies this assay can detect is 138 copies/mL. A negative result does not preclude SARS-Cov-2 infection and should not be used as the sole basis for treatment or other patient management decisions. A negative result may occur with  improper specimen collection/handling, submission of specimen other than nasopharyngeal swab, presence of viral mutation(s) within the areas targeted by this assay, and inadequate number of viral copies(<138 copies/mL). A negative result must be combined with clinical observations, patient history, and epidemiological information. The expected result is Negative.  Fact Sheet for Patients:  bloggercourse.com  Fact Sheet for Healthcare Providers:  seriousbroker.it  This test is no t yet approved or cleared by the [place]  FDA and  has been authorized for detection and/or diagnosis of SARS-CoV-2 by FDA under an Emergency Use Authorization (EUA).  This EUA will remain  in effect (meaning this test can be used) for the duration of the COVID-19 declaration under Section 564(b)(1) of the Act, 21 U.S.C.section 360bbb-3(b)(1), unless the authorization is terminated  or revoked sooner.       Influenza A by PCR NEGATIVE NEGATIVE Final   Influenza B by PCR NEGATIVE NEGATIVE Final    Comment: (NOTE) The Xpert Xpress SARS-CoV-2/FLU/RSV plus assay is intended as an aid in the diagnosis of influenza from Nasopharyngeal swab specimens and should not be used as a sole basis for treatment. Nasal washings and aspirates are unacceptable for  Xpert Xpress SARS-CoV-2/FLU/RSV testing.  Fact Sheet for Patients: bloggercourse.com  Fact Sheet for Healthcare Providers: seriousbroker.it  This test is not yet approved or cleared by the [place]  FDA and has been authorized for detection and/or diagnosis of SARS-CoV-2 by FDA under an Emergency Use Authorization (EUA). This EUA will remain in effect (meaning this test can be used) for the duration of the COVID-19 declaration under Section 564(b)(1) of the Act, 21 U.S.C. section 360bbb-3(b)(1), unless the authorization is terminated or revoked.     Resp Syncytial Virus by PCR NEGATIVE NEGATIVE Final    Comment: (NOTE) Fact Sheet for Patients: bloggercourse.com  Fact Sheet for Healthcare Providers: seriousbroker.it  This test is not yet approved or cleared by the [place]  FDA and has been authorized for detection and/or diagnosis of SARS-CoV-2 by FDA under an Emergency Use Authorization (EUA). This EUA will remain in effect (meaning this test can be used) for the duration of the COVID-19 declaration under Section 564(b)(1) of the Act, 21 U.S.C. section 360bbb-3(b)(1), unless the authorization is terminated or revoked.  Performed at [facility], [address] [place], [place] [address]       Radiology Studies last 3 days: PERIPHERAL VASCULAR CATHETERIZATION Result Date: 11/17/2024 See surgical note for result.  US  ARTERIAL ABI (SCREENING LOWER EXTREMITY) Result Date: 11/14/2024 CLINICAL DATA:  Tobacco abuse, hypertension, claudication, diabetes, hyperlipidemia EXAM: NONINVASIVE PHYSIOLOGIC VASCULAR STUDY OF BILATERAL LOWER EXTREMITIES TECHNIQUE: Evaluation of both lower extremities were performed at rest, including calculation of ankle-brachial indices with single level Doppler, pressure and pulse volume recording. COMPARISON:  08/23/2021 FINDINGS: Right  ABI:  0.7 Left ABI:  0.8 Right Lower Extremity:  Abnormal monophasic arterial waveforms. Left Lower Extremity:  Abnormal monophasic arterial waveforms. 0.5-0.79 Moderate PAD IMPRESSION: 1. Abnormal ABI and arterial waveforms within the bilateral lower extremities, consistent with moderate underlying peripheral arterial disease. Electronically Signed   By: Ozell Daring M.D.   On: 11/14/2024 18:00   US  Venous Img Lower Bilateral Result Date: 11/14/2024 EXAM: ULTRASOUND DUPLEX OF THE BILATERAL LOWER EXTREMITY VEINS TECHNIQUE: Duplex ultrasound using B-mode/gray scaled imaging and Doppler spectral analysis and color flow was obtained of the deep venous structures of the bilateral lower extremity. COMPARISON: US  Left Lower Extremity 11/16/2018. CLINICAL HISTORY: leg pain and swelling FINDINGS: LEFT: The common femoral vein, femoral vein, and popliteal vein of the left lower extremity demonstrate normal compressibility with normal color flow and spectral analysis. The calf veins are suboptimally visualized. RIGHT: The common femoral vein, femoral vein, and popliteal vein of the right lower extremity demonstrate normal compressibility with normal color flow and spectral analysis. The calf veins are suboptimally visualized. IMPRESSION: 1. No evidence of DVT. Evaluation of the calf veins is suboptimal. Electronically signed by: Evalene Coho MD 11/14/2024 06:03 AM EST RP Workstation: [id number]   CT Angio Chest PE W and/or Wo Contrast Result Date: 11/14/2024 EXAM: CTA of  the Chest with contrast for PE 11/14/2024 02:55:15 AM TECHNIQUE: CTA of the chest was performed after the administration of intravenous contrast. Multiplanar reformatted images are provided for review. MIP images are provided for review. Automated exposure control, iterative reconstruction, and/or weight based adjustment of the mA/kV was utilized to reduce the radiation dose to as low as reasonably achievable. COMPARISON: None available. CLINICAL  HISTORY: Pulmonary embolism (PE) suspected, high prob; Short of breath, tachypneic, new oxygen requirement, chest x-ray shows pulmonary nodule. FINDINGS: PULMONARY ARTERIES: Pulmonary arteries are adequately opacified for evaluation. Central pulmonary arteries are abnormal in caliber. Acute pulmonary embolus is present, characterized by a tiny single branching intraluminal filling defect within the left lower lobar posterior segmental pulmonary artery. The embolic burden is tiny. MEDIASTINUM: Extensive multivessel coronary artery calcifications. Mild cardiomegaly with asymmetric enlargement of the right heart chambers in keeping with elevated right heart pressure. Relative enlargement of the right ventricle with inversion of the normal RV/LV ratio, unlikely to be the result of the tiny embolic insult and more likely representing a chronic finding. Moderate atherosclerotic calcification within the thoracic aorta. No aortic aneurysm. LYMPH NODES: No mediastinal, hilar or axillary lymphadenopathy. LUNGS AND PLEURA: A 13 mm nodule is seen within the right upper lobe (48/5), indeterminate. Perihilar ground-glass pulmonary infiltrate asymmetrically involving the left hilum in keeping with mild asymmetric pulmonary edema. No focal consolidation. No pleural effusion or pneumothorax. UPPER ABDOMEN: Limited images of the upper abdomen are unremarkable. SOFT TISSUES AND BONES: Osseous structures are age-appropriate. No acute bone abnormality. No lytic or blastic bone lesion. No acute soft tissue abnormality. The critical findings of this study were discussed directly with Dr. Neomi by myself at 3:32 am. IMPRESSION: 1. Tiny acute pulmonary embolus in the left lower lobar posterior segmental pulmonary artery. No CT evidence of right heart strain attributable to this embolus, with right heart enlargement favored to be chronic. 2. Extensive multivessel coronary artery calcifications and mild cardiomegaly with asymmetric enlargement  of the right heart chambers, favored chronic. 3. Mild asymmetric pulmonary edema involving the left hilum. 4. Indeterminate 13 mm right upper lobe pulmonary nodule. Recommend non-contrast chest CT at 3 months, PET/CT, or tissue sampling per Fleischner Society Guidelines. 5. The critical findings of this study were discussed directly with Dr. Neomi by myself at 3:32 am. Electronically signed by: Dorethia Molt MD 11/14/2024 03:33 AM EST RP Workstation: [id number]   DG Chest Portable 1 View Result Date: 11/14/2024 EXAM: 1 VIEW(S) XRAY OF THE CHEST 11/14/2024 01:20:16 AM COMPARISON: 10/28/2024 CLINICAL HISTORY: Shortness of breath. FINDINGS: LUNGS AND PLEURA: 12 mm right upper lobe pulmonary nodule, stable since the prior study. No acute airspace disease. No pleural effusion. No pneumothorax. HEART AND MEDIASTINUM: Cardiomegaly. Aortic atherosclerosis. BONES AND SOFT TISSUES: No acute osseous abnormality. IMPRESSION: 1. No acute cardiopulmonary disease. 2. Stable 12 mm right upper lobe pulmonary nodule. This could be further evaluated with chest CT. Electronically signed by: Franky Crease MD 11/14/2024 01:26 AM EST RP Workstation: [id number]         Laneta Blunt, DO [facility] 11/17/2024, 5:29 PM    Dictation software may have been used to generate the above note. Typos may occur and escape review in typed/dictated notes. Please contact Dr Blunt directly for clarity if needed.  Staff may message me via secure chat in Epic  but this may not receive an immediate response,  please page me for urgent matters!  If 7PM-7AM, please contact night coverage [email or web address]

## 2024-11-17 NOTE — Op Note (Signed)
 [facility]  Percutaneous Study/Intervention Procedural Note   Date of Surgery: 11/17/2024  Surgeon(s):Janiah Devinney    Assistants:none  Pre-operative Diagnosis: PAD with ulceration of the right lower extremity  Post-operative diagnosis:  Same  Procedure(s) Performed:             1.  Ultrasound guidance for vascular access left femoral artery             2.  Catheter placement into right common femoral artery from left femoral approach             3.  Aortogram and selective right lower extremity angiogram             4.  Percutaneous transluminal angioplasty of right distal SFA with 5 mm diameter Lutonix drug-coated angioplasty balloon             5.  Stent placement to the right distal SFA with 6 mm diameter by 10 cm length life stent  6.  Stent placement to the right external iliac artery with 8 mm diameter by 8 cm length life star stent             7.  Celt closure device left femoral artery  EBL: 5 cc  Contrast: 50 cc  Fluoro Time: 5.3 minutes  Moderate Conscious Sedation Time: approximately 41 minutes using 0.5 mg of Versed  and 50 mcg of Fentanyl               Indications:  Patient is a 89 y.o.female with pain in both lower extremities and nonhealing ulcerations. The patient has noninvasive study showing reduced ABIs bilaterally. The patient is brought in for angiography for further evaluation and potential treatment.  Due to the limb threatening nature of the situation, angiogram was performed for attempted limb salvage. The patient is aware that if the procedure fails, amputation would be expected.  The patient also understands that even with successful revascularization, amputation may still be required due to the severity of the situation.  Risks and benefits are discussed and informed consent is obtained.   Procedure:  The patient was identified and appropriate procedural time out was performed.  The patient was then placed supine on the table and  prepped and draped in the usual sterile fashion. Moderate conscious sedation was administered during a face to face encounter with the patient throughout the procedure with my supervision of the RN administering medicines and monitoring the patient's vital signs, pulse oximetry, telemetry and mental status throughout from the start of the procedure until the patient was taken to the recovery room. Ultrasound was used to evaluate the left common femoral artery.  It was patent .  A digital ultrasound image was acquired.  A Seldinger needle was used to access the left common femoral artery under direct ultrasound guidance and a permanent image was performed.  A 0.035 J wire was advanced without resistance and a 5Fr sheath was placed.  Pigtail catheter was placed into the aorta and an AP aortogram was performed. This demonstrated normal renal arteries and normal aorta. The left iliac arteries were fairly normal. The right common iliac artery was fairly normal but the right external iliac artery had tandem stenoses in the 60 to 70% range 1 in the proximal segment and 1 in the mid segment. I then crossed the aortic bifurcation and advanced to the right femoral head. Selective right lower extremity angiogram was then performed. This demonstrated fairly normal common femoral artery, a small profunda femoris artery,  and a fairly normal proximal and mid superficial femoral artery.  The distal SFA had about a 70 to 75% stenosis just above Hunter's canal.  The mid and distal popliteal artery were fairly normal.  There was then two-vessel runoff in the peroneal and anterior tibial artery distally without focal stenosis. It was felt that it was in the patient's best interest to proceed with intervention after these images to avoid a second procedure and a larger amount of contrast and fluoroscopy based off of the findings from the initial angiogram. The patient was systemically heparinized and a 6 French Ansell sheath was then  placed over the [employer] wire. I then used a Kumpe catheter and the advantage wire to easily navigate through the distal SFA stenosis.  I then performed angioplasty with a 5 mm diameter by 10 cm length Lutonix drug-coated angioplasty balloon inflated to 10 atm for 1 minute.  Completion imaging showed a suboptimal result with residual stenosis of greater than 50%.  A 6 mm diameter by 10 cm length life stent was then deployed in the distal right SFA and postdilated with a 5 mm diameter Lutonix drug-coated balloon with excellent angiographic completion result and less than 10% residual stenosis.  I then turned my attention to the right external iliac artery lesion.  An 8 mm diameter by 8 cm length life star stent was deployed from the origin of the right external iliac artery down to the distal right external iliac artery to encompass the tandem lesions.  This was postdilated with a 7 mm diameter by 8 cm length Lutonix drug-coated balloon with excellent angiographic completion result and less than 10% residual stenosis. I elected to terminate the procedure. The sheath was removed and Celt closure device was deployed in the left femoral artery with excellent hemostatic result. The patient was taken to the recovery room in stable condition having tolerated the procedure well.  Findings:               Aortogram:  This demonstrated normal renal arteries and normal aorta. The left iliac arteries were fairly normal. The right common iliac artery was fairly normal but the right external iliac artery had tandem stenoses in the 60 to 70% range 1 in the proximal segment and 1 in the mid segment             Right lower Extremity:  This demonstrated fairly normal common femoral artery, a small profunda femoris artery, and a fairly normal proximal and mid superficial femoral artery.  The distal SFA had stenosis in the 70 to 75% range just above Hunter's canal.  The mid and distal popliteal artery were fairly normal.   There was then two-vessel runoff in the peroneal and anterior tibial artery distally without focal stenosis.   Disposition: Patient was taken to the recovery room in stable condition having tolerated the procedure well.  Complications: None  Jenny [name] 11/17/2024 1:00 PM   This note was created with Dragon Medical transcription system. Any errors in dictation are purely unintentional.

## 2024-11-17 NOTE — Hospital Course (Addendum)
 Hospital course / significant events:   HPI: Jenny [name] is a 89 y.o. female with medical history significant for HFpEF, hypertension, persistent A-fib on Eliquis , tachybradycardia syndrome, CAD, COPD, CKD stage 3b, type 2 diabetes, anemia. Presents to ED from [facility] (rehab), 3 days LE edema and R leg pain 1 day, also SOB  12/07: to ED early hours AM. CTA chest showed tiny segmental PE left lower lobe without heart strain, extensive multivessel CAD and mild asymmetric pulmonary edema around the left hilum. Initially was treated with DuoNebs and Solu-Medrol . She initially received LR boluses but subsequently given a dose of Lasix  as test results came in. Started on vancomycin  and Rocephin  and also on a heparin  infusion for PE. Admitted with question sepsis d/t lower extremity cellulitis, vs non-infectious SIRS multifactorial including acute dyspnea secondary to COPD and CHF exacerbation. Podiatry consulted re: venous stasis ulcers on feet - bedisde debridement, appear superficial and recs for daily dressing changes, ACE wraps to knee, po abx on dc, WBAT, f/u outpatient 1 week after discharge   12/08: Vascular surgery consult d/t decreased ABI / PE> No thrombectomy given small PE. RLE angio planned for 12/10 12/09: stable 12/10: LLE angio and stents today. Cr 1.99 12/11: renal fxn improving Cr 1.56. Vascular surgery plans on taking the patient to the vascular lab on Monday, 11/22/2024 for RLE angiogram - pt to remain on heparin  infusion until after procedure  12/12: doing well / stable.  12/13: VERY short of breath w/ minimal exertion but improves quickly w/ rest. Changed nebs, will give 1 dose IV lasix  today  12/14: good UOP w/ IV lasix , increased po dose today. Still quite SOB, wheezing. (+)influenza A --> tamiflu , postponing angio 12/15: continue tx COPD exacerbation d/t influenza      Consultants:  Podiatry Vascular Surgery  Palliative Care   Procedures/Surgeries: RLE  angiogram and stents 11/17/24       ASSESSMENT & PLAN:   LE edema / erythema  Multifactorial, include: Venous stasis dermatitis / ulceration  Cellulitis  Peripheral arterial disease LE edema d/t CHF / HFpEF exacerbation  Treat underlying cause(s) as noted   Dyspnea, multifactorial COPD likely exacerbated d/t influenza A infection  CHF appears stable/euvolemic now post-treatment  Significant wheezing on exam minimal atelectasis/rales crackles  [place] O2 Tx Flu, COPD, and CHF as noted below  COPD  Chronic hypoxic respiratory failure on 2L [place] O2 at baseline  Exacerbated now d/t influenza A Arrives w/ prednisone  20 on med list, says has been maintained on that for a couple of months prednisone  to 10 daily --> burst 40 mg daily x5 days given new exacerbation, then ordered back to 10 mg daily to restart Saturday  O2 supplementation Tamiflu  per pharmacy renal dose  Adding schedule nebs --> budesonide  bid, duoneb q6h while awake Albuterol  prn  will need close outpt pulm f/u  Venous stasis  Venous stasis ulcers lower extremities bilaterally Question Cellulitis daily dressing changes, ACE wraps to knee Continue keflex  for 7-10d total  WBAT f/u podiatry outpatient 1 week after discharge  Peripheral arterial disease  S/p angiogram RLE w/ stenting 11/17/24 Vascular surgery following Vascular surgery had planned on taking the patient to the vascular lab on Monday, 11/22/2024 for a right lower extremity angiogram - pt to remain on heparin  infusion until after procedure --> POSTPONED d/t influenza infection reschedule per vascular team    Acute PE Very small.  On apixaban  2.5 bid at baseline, patient unsure if she's been receiving regularly  at snf. Given cr under 1.5 assumes stays there would warrant full dose of 5 bid heparin  for now pending vascular angio See pharmacy note 12/11 for eliquis  - test claim for eliquis  5 mg bis $85  HFpEF with acute exacerbation - now  stabilized  Recent hospitalization with dose reduction in diuretic, likely under-diuresed (discharged on lasix  40, had previously been on bid dosing). Cr up from admission after IV diuresis, but stable lasix  40 mg oral daily--> bid  Monitor I&O Monitor BMP    Hyperkalemia - resolved  K 5.3 treated with lokelma  improved Monitor BMP   AKI on ckd 3b Cr 1.39 on admission --> peak at 2.09 on 12/08 --> 11/21/2024 1.29 resuming oral lasix , would consider hold if kidney function worsens again Monitor BMP   History tachy-brady A-fib Rate controlled currently heparin  as above Resume eliquis  on discharge as above - See pharmacy note 12/11 for eliquis  - test claim for eliquis  5 mg bis $85   Elevated troponin, ACS ruled out  Mild, stable, flat, likely demand as no chest pain Recheck troponin / EKG prn chest pain.    Neuropathy home gabapentin    Pulm nodule outpt f/u   Hypokalemia Replace as needed Monitor BMP, Mg   Debility Comes from rehab PT consulted patient does not want to return to [facility]. TOC aware, looking elsewhere but if refusing available facility then will have to DC to home instead if unable to arrange alternative placement     Class 2 / borderline Class 3 obesity based on BMI: Body mass index is 38.1 kg/m.[name] Significantly low or high BMI is associated with higher medical risk.  Underweight - under 18  overweight - 25 to 29 obese - 30 or more Class 1 obesity: BMI of 30.0 to 34 Class 2 obesity: BMI of 35.0 to 39 Class 3 obesity: BMI of 40.0 to 49 Super Morbid Obesity: BMI 50-59 Super-super Morbid Obesity: BMI 60+ Healthy nutrition and physical activity advised as adjunct to other disease management and risk reduction treatments    DVT prophylaxis: heparin  IV fluids: no continuous IV fluids  Nutrition: cardiac diet  Central lines / other devices: none  Code Status: DNR ACP documentation reviewed:  none on file in [facility]  [facility] needs: SNF rehab  placement Medical barriers to dispo:  Expected medical readiness for discharge pending angio which was delayed d/t influenza infection

## 2024-11-17 NOTE — TOC Progression Note (Addendum)
 Transition of Care [facility]) - Progression Note    Patient Details  Name: Jenny [name] MRN: [id number] Date of Birth: 05/20/1933  [facility]) CM/SW Contact  Victory Jackquline RAMAN, RN Phone Number: 11/17/2024, 11:45 AM  Clinical Narrative:  RNCM met with the patient at bedside, introduced role and explained that discharge planning would be discussed. Advised her that we were going to review her bed offers and she asked me to discuss it with her son. He will be making the final decision. RNCM called [name] @ [phone] and left a message. Awaiting a call back. RNCM will continue to follow for discharge planning needs.    12:29 pm: RNCM received a call from patient's son Jenny [name], Introduced role and explained that discharge planning would be discussed. RNCM let him know that I wanted to review bed offers with him and he asked me to email him that list to: [email or web address]. Medicare SNF List for [facility] and [place] were emailed.    Expected Discharge Plan: Skilled Nursing Facility Barriers to Discharge: Continued Medical Work up               Expected Discharge Plan and Services     Post Acute Care Choice: Skilled Nursing Facility Living arrangements for the past 2 months: Skilled Nursing Facility, Single Family Home                                       Social Drivers of Health (SDOH) Interventions SDOH Screenings   Food Insecurity: No Food Insecurity (11/15/2024)  Housing: Low Risk  (11/15/2024)  Transportation Needs: No Transportation Needs (11/15/2024)  Utilities: Not At Risk (11/15/2024)  Alcohol Screen: Low Risk  (07/02/2024)  Depression (PHQ2-9): Low Risk  (07/22/2022)  Financial Resource Strain: Low Risk  (07/02/2024)  Social Connections: Socially Isolated (11/15/2024)  Tobacco Use: Medium Risk (11/15/2024)    Readmission Risk Interventions    07/05/2024   10:24 AM  Readmission Risk Prevention Plan  Transportation Screening  Complete  HRI or Home Care Consult Complete  Social Work Consult for Recovery Care Planning/Counseling Complete  Palliative Care Screening Not Applicable  Medication Review Oceanographer) Complete

## 2024-11-17 NOTE — Progress Notes (Signed)
 PHARMACY - ANTICOAGULATION CONSULT NOTE  Pharmacy Consult for IV Heparin   Indication: pulmonary embolus  Patient Measurements: Height: 5' 2 (157.5 cm) Weight: 94.5 kg (208 lb 5.4 oz) IBW/kg (Calculated) : 50.1 HEPARIN  DW (KG): 71.4  Labs: Recent Labs    11/15/24 0509 11/15/24 1248 11/15/24 1410 11/15/24 2309 11/16/24 0649 11/16/24 1623 11/17/24 0155 11/17/24 0452 11/17/24 1052  HGB 8.7*  --   --   --  8.5*  --   --  8.4*  --   HCT 28.9*  --   --   --  28.3*  --   --  28.5*  --   PLT 209  --   --   --  166  --   --  242  --   APTT 97*   < >  --    < > 54* 121* 117*  --  69*  HEPARINUNFRC >1.10*  --   --   --  0.96*  --  1.01*  --   --   CREATININE 2.00*  --  2.09*  --  1.99*  --   --   --   --    < > = values in this interval not displayed.   Estimated Creatinine Clearance: 20.5 mL/min (A) (by C-G formula based on SCr of 1.99 mg/dL (H)).  Medical History: Past Medical History:  Diagnosis Date   ACUT DUOD ULCER W/HEMORR&PERF W/O MENTION OBST 10/05/2009   NSAID induced   ALLERGIC RHINITIS CAUSE UNSPECIFIED    ANEMIA-NOS    CAD (coronary artery disease) 06/08/2009   DEs RCA with 70% LAD and EF 60%   CHF (congestive heart failure) (HCC)    COPD    mild obst on PFTs 03/2010   Diabetes mellitus 06/2010 dx   Mild, diet controlled   GLAUCOMA    HYPERLIPIDEMIA    HYPERTENSION, BENIGN    MYOCARDIAL INFARCTION 06/08/2009   des to rca   Persistent atrial fibrillation (HCC)    Dx 08/2021   TOBACCO ABUSE    Assessment: Pharmacy consulted to dose heparin  in this 89 year old female admitted with PE.  Pt was on Eliquis  2.5 mg PO BID PTA, last dose on 12/6 @ 2100.  Goal of Therapy:  Heparin  level 0.3-0.7 units/ml aPTT 66 - 102  seconds Monitor platelets by anticoagulation protocol: Yes   12/9 1623 aPTT 121s, supratherapeutic @ 1100 u/hr 12/10 0155 aPTT 117s, supratherapeutic / HL 1.01 12/10 1052 aPTT 69s, therapeutic x 1  Plan:  aPTT is therapeutic x 1 Continue  heparin  infusion to 850 units/hr Recheck aPTT 8 hours after rate change and HL with AM labs Adjust based on aPTT until correlation with HL. Re-check a HL tomorrow AM CBC daily while on heparin   Marolyn KATHEE Mare 11/17/2024 11:23 AM

## 2024-11-18 ENCOUNTER — Telehealth (HOSPITAL_COMMUNITY): Payer: Self-pay | Admitting: Pharmacy Technician

## 2024-11-18 ENCOUNTER — Other Ambulatory Visit (HOSPITAL_COMMUNITY): Payer: Self-pay

## 2024-11-18 DIAGNOSIS — N1832 Chronic kidney disease, stage 3b: Secondary | ICD-10-CM

## 2024-11-18 DIAGNOSIS — Z9889 Other specified postprocedural states: Secondary | ICD-10-CM

## 2024-11-18 DIAGNOSIS — Z95828 Presence of other vascular implants and grafts: Secondary | ICD-10-CM

## 2024-11-18 LAB — GLUCOSE, CAPILLARY
Glucose-Capillary: 112 mg/dL — ABNORMAL HIGH (ref 70–99)
Glucose-Capillary: 181 mg/dL — ABNORMAL HIGH (ref 70–99)
Glucose-Capillary: 244 mg/dL — ABNORMAL HIGH (ref 70–99)
Glucose-Capillary: 165 mg/dL — ABNORMAL HIGH (ref 70–99)

## 2024-11-18 LAB — BASIC METABOLIC PANEL WITH GFR
Anion gap: 10 (ref 5–15)
BUN: 43 mg/dL — ABNORMAL HIGH (ref 8–23)
CO2: 32 mmol/L (ref 22–32)
Calcium: 8.3 mg/dL — ABNORMAL LOW (ref 8.9–10.3)
Chloride: 99 mmol/L (ref 98–111)
Creatinine, Ser: 1.56 mg/dL — ABNORMAL HIGH (ref 0.44–1.00)
GFR, Estimated: 31 mL/min — ABNORMAL LOW (ref >60)
Glucose, Bld: 92 mg/dL (ref 70–99)
Potassium: 4.4 mmol/L (ref 3.5–5.1)
Sodium: 140 mmol/L (ref 135–145)

## 2024-11-18 LAB — CBC
HCT: 29.5 % — ABNORMAL LOW (ref 36.0–46.0)
Hemoglobin: 8.7 g/dL — ABNORMAL LOW (ref 12.0–15.0)
MCH: 25.1 pg — ABNORMAL LOW (ref 26.0–34.0)
MCHC: 29.5 g/dL — ABNORMAL LOW (ref 30.0–36.0)
MCV: 85.3 fL (ref 80.0–100.0)
Platelets: 283 K/uL (ref 150–400)
RBC: 3.46 MIL/uL — ABNORMAL LOW (ref 3.87–5.11)
RDW: 19.5 % — ABNORMAL HIGH (ref 11.5–15.5)
WBC: 6 K/uL (ref 4.0–10.5)
nRBC: 4.5 % — ABNORMAL HIGH (ref 0.0–0.2)

## 2024-11-18 LAB — APTT
aPTT: 81 s — ABNORMAL HIGH (ref 24–36)
aPTT: 69 s — ABNORMAL HIGH (ref 24–36)
aPTT: 101 s — ABNORMAL HIGH (ref 24–36)

## 2024-11-18 NOTE — Progress Notes (Signed)
 PHARMACY - ANTICOAGULATION CONSULT NOTE  Pharmacy Consult for IV Heparin   Indication: pulmonary embolus  Patient Measurements: Height: 5' 2 (157.5 cm) Weight: 87.8 kg (193 lb 9.6 oz) IBW/kg (Calculated) : 50.1 HEPARIN  DW (KG): 71.4  Labs: Recent Labs    11/16/24 0649 11/16/24 1623 11/17/24 0155 11/17/24 0452 11/17/24 1052 11/17/24 2223 11/18/24 0421 11/18/24 1148 11/18/24 1922  HGB 8.5*  --   --  8.4*  --   --  8.7*  --   --   HCT 28.3*  --   --  28.5*  --   --  29.5*  --   --   PLT 166  --   --  242  --   --  283  --   --   APTT 54*   < > 117*  --    < > 65* 81* 69* 101*  HEPARINUNFRC 0.96*  --  1.01*  --   --  0.73*  --   --   --   CREATININE 1.99*  --   --   --   --   --  1.56*  --   --    < > = values in this interval not displayed.   Estimated Creatinine Clearance: 25.2 mL/min (A) (by C-G formula based on SCr of 1.56 mg/dL (H)).  Medical History: Past Medical History:  Diagnosis Date   ACUT DUOD ULCER W/HEMORR&PERF W/O MENTION OBST 10/05/2009   NSAID induced   ALLERGIC RHINITIS CAUSE UNSPECIFIED    ANEMIA-NOS    CAD (coronary artery disease) 06/08/2009   DEs RCA with 70% LAD and EF 60%   CHF (congestive heart failure) (HCC)    COPD    mild obst on PFTs 03/2010   Diabetes mellitus 06/2010 dx   Mild, diet controlled   GLAUCOMA    HYPERLIPIDEMIA    HYPERTENSION, BENIGN    MYOCARDIAL INFARCTION 06/08/2009   des to rca   Persistent atrial fibrillation (HCC)    Dx 08/2021   TOBACCO ABUSE    Assessment: Pharmacy consulted to dose heparin  in this 89 year old female admitted with PE.  Pt was on Eliquis  2.5 mg PO BID PTA, last dose on 12/6 @ 2100.  Goal of Therapy:  Heparin  level 0.3-0.7 units/ml aPTT 66 - 102  seconds Monitor platelets by anticoagulation protocol: Yes   12/9 1623 aPTT 121s, supratherapeutic @ 1100 u/hr 12/10 0155 aPTT 117s, supratherapeutic / HL 1.01 12/10 1052 aPTT 69s, therapeutic x 1 12/10 2223 aPTT 0.65 HL 0.73.  12/11 0421 aPTT  81 12/11 1148 aPTT 69 12/11 1922 aPTT 101, thera x 1; 950 un/h   Plan:  --aPTT is therapeutic x 1 at upper limit of goal range. Level was drawn early --Re-check confirmatory HL in 8 hours --Daily CBC per protocol while on IV heparin . Re-check a heparin  level with next aPTT to evaluate for correlation  Marolyn KATHEE Mare 11/18/2024 9:08 PM

## 2024-11-18 NOTE — Progress Notes (Signed)
 Progress Note    11/18/2024 9:18 AM 1 Day Post-Op  Subjective:  Jenny [name] is an 89 yo female who is now POD #1 from:  Date of Surgery: 11/17/2024   Surgeon(s):DEW,JASON     Assistants:none   Pre-operative Diagnosis: PAD with ulceration of the right lower extremity   Post-operative diagnosis:  Same   Procedure(s) Performed:             1.  Ultrasound guidance for vascular access left femoral artery             2.  Catheter placement into right common femoral artery from left femoral approach             3.  Aortogram and selective right lower extremity angiogram             4.  Percutaneous transluminal angioplasty of right distal SFA with 5 mm diameter Lutonix drug-coated angioplasty balloon             5.  Stent placement to the right distal SFA with 6 mm diameter by 10 cm length life stent             6.  Stent placement to the right external iliac artery with 8 mm diameter by 8 cm length life star stent             7.  Celt closure device left femoral artery   EBL: 5 cc   Contrast: 50 cc   Fluoro Time: 5.3 minutes  Patient is seen comfortably in bed this morning.  She endorses that her left leg feels much better than it did yesterday.  She endorses she continues to have the same pain and problems with her right leg.  She is insisting that we investigate and evaluate her right lower extremity pain.  No complaints overnight vitals all remained stable.   Vitals:   11/17/24 2356 11/18/24 0302  BP: 122/64 (!) 141/63  Pulse: 95 66  Resp: 20 20  Temp: 98.1 F (36.7 C) 98.1 F (36.7 C)  SpO2: 99% 93%   Physical Exam: Cardiac:  RRR, Normal S1, S2. No murmurs. Lungs:  Positive labored breathing due to her COPD with 2 liters [place] oxygen. Positive Rhonchi throughout. No rales but diminished breath sounds in the bases.  Incisions: Left Femoral Artery Puncture site with dressing clean dry and intact. No S&S of hematoma, seroma or infection to note.  Extremities:  All  extremities are warm to touch with palpable pulses in upper extremities. Lower extremities are bandaged but assessment of toes being purple. Only able to get doppler DP/PT pulses and they are weak  Abdomen:  Positive bowel sounds throughout, morbid obesity, soft non tender  Neurologic: AAOX3 answers all questions and follows commands.   CBC    Component Value Date/Time   WBC 6.0 11/18/2024 0421   RBC 3.46 (L) 11/18/2024 0421   HGB 8.7 (L) 11/18/2024 0421   HCT 29.5 (L) 11/18/2024 0421   PLT 283 11/18/2024 0421   MCV 85.3 11/18/2024 0421   MCH 25.1 (L) 11/18/2024 0421   MCHC 29.5 (L) 11/18/2024 0421   RDW 19.5 (H) 11/18/2024 0421   LYMPHSABS 0.3 (L) 11/14/2024 0022   MONOABS 0.7 11/14/2024 0022   EOSABS 0.0 11/14/2024 0022   BASOSABS 0.0 11/14/2024 0022    BMET    Component Value Date/Time   NA 140 11/18/2024 0421   NA 139 10/21/2024 1046   K 4.4 11/18/2024 0421   CL 99  11/18/2024 0421   CO2 32 11/18/2024 0421   GLUCOSE 92 11/18/2024 0421   BUN 43 (H) 11/18/2024 0421   BUN 48 (H) 10/21/2024 1046   CREATININE 1.56 (H) 11/18/2024 0421   CALCIUM  8.3 (L) 11/18/2024 0421   GFRNONAA 31 (L) 11/18/2024 0421   GFRAA  10/09/2009 0350    >60        The eGFR has been calculated using the MDRD equation. This calculation has not been validated in all clinical situations. eGFR's persistently <60 mL/min signify possible Chronic Kidney Disease.    INR    Component Value Date/Time   INR 1.3 (H) 11/14/2024 0354     Intake/Output Summary (Last 24 hours) at 11/18/2024 0918 Last data filed at 11/18/2024 0219 Gross per 24 hour  Intake 240 ml  Output 1900 ml  Net -1660 ml     Assessment/Plan:  89 y.o. female is s/p SEE ABOVE  1 Day Post-Op   PLAN  Vascular surgery plans on taking the patient to the vascular lab on Monday, 11/22/2024 for a right lower extremity angiogram.  I discussed in detail with the patient at the bedside this morning again the procedure, benefits, risk,  complications.  She verbalized her understanding.  I answered all her questions today.  Patient will be made n.p.o. after midnight on Sunday for procedure on Monday with Dr. Selinda Gu MD.  I discussed the case in detail with Dr. Selinda Gu MD and he agrees with the plan.  DVT prophylaxis: Patient to remain on heparin  infusion until after procedure on Monday.   [name] 11/18/2024 9:18 AM

## 2024-11-18 NOTE — Progress Notes (Signed)
 PHARMACY - ANTICOAGULATION CONSULT NOTE  Pharmacy Consult for IV Heparin   Indication: pulmonary embolus  Patient Measurements: Height: 5' 2 (157.5 cm) Weight: 87.8 kg (193 lb 9.6 oz) IBW/kg (Calculated) : 50.1 HEPARIN  DW (KG): 71.4  Labs: Recent Labs    11/15/24 1410 11/15/24 2309 11/16/24 0649 11/16/24 1623 11/17/24 0155 11/17/24 0452 11/17/24 1052 11/17/24 2223 11/18/24 0421  HGB  --    < > 8.5*  --   --  8.4*  --   --  8.7*  HCT  --   --  28.3*  --   --  28.5*  --   --  29.5*  PLT  --   --  166  --   --  242  --   --  283  APTT  --    < > 54*   < > 117*  --  69* 65* 81*  HEPARINUNFRC  --   --  0.96*  --  1.01*  --   --  0.73*  --   CREATININE 2.09*  --  1.99*  --   --   --   --   --  1.56*   < > = values in this interval not displayed.   Estimated Creatinine Clearance: 25.2 mL/min (A) (by C-G formula based on SCr of 1.56 mg/dL (H)).  Medical History: Past Medical History:  Diagnosis Date   ACUT DUOD ULCER W/HEMORR&PERF W/O MENTION OBST 10/05/2009   NSAID induced   ALLERGIC RHINITIS CAUSE UNSPECIFIED    ANEMIA-NOS    CAD (coronary artery disease) 06/08/2009   DEs RCA with 70% LAD and EF 60%   CHF (congestive heart failure) (HCC)    COPD    mild obst on PFTs 03/2010   Diabetes mellitus 06/2010 dx   Mild, diet controlled   GLAUCOMA    HYPERLIPIDEMIA    HYPERTENSION, BENIGN    MYOCARDIAL INFARCTION 06/08/2009   des to rca   Persistent atrial fibrillation (HCC)    Dx 08/2021   TOBACCO ABUSE    Assessment: Pharmacy consulted to dose heparin  in this 89 year old female admitted with PE.  Pt was on Eliquis  2.5 mg PO BID PTA, last dose on 12/6 @ 2100.  Goal of Therapy:  Heparin  level 0.3-0.7 units/ml aPTT 66 - 102  seconds Monitor platelets by anticoagulation protocol: Yes   12/9 1623 aPTT 121s, supratherapeutic @ 1100 u/hr 12/10 0155 aPTT 117s, supratherapeutic / HL 1.01 12/10 1052 aPTT 69s, therapeutic x 1 12/10 2223 aPTT 0.65 HL 0.73.  12/11 0421 aPTT  81  Plan:  aPTT is therapeutic. Will continue heparin  infusion at 900 units/hr. Recheck aPTT in  8 hours. Heparin  level and CBC daily.    Cathaleen GORMAN Blanch, PharmD 11/18/2024 6:11 AM

## 2024-11-18 NOTE — Progress Notes (Signed)
 Occupational Therapy Treatment Patient Details Name: Jenny [name] MRN: [id number] DOB: [birth year] Today's Date: 11/18/2024   History of present illness Pt is an 89 y/o F admitted on 11/14/24 after presenting with c/o BLE pain, SOB. Pt is being treated for BLE cellulitis, also found to have acute PE. PMH: HFpEF, HTN, persistent a-fib on Eliquis , tachybradycardia syndrome, CAD, COPD, CKD 3B, DM2, anemia   OT comments  Patient seen for OT treatment on this date. Upon arrival to room patient resting in bed, easily awoken and initially refusing therapy but with coaxing, agreeable to treatment. Patient transitioned to sitting EOB with mod A for BLE and min A to lift trunk, L lateral lean when sitting EOB. OT assisted with gown changing as clothing was soiled, patient required A to don new gown due to fatigue. Patient refused any further intervention and requested to return to bed, mod A to return to supine, unable to participate with repositioning in bed and required max A x 2.  Patient ended treatment in bed with bed/chair alarm on and all needs within reach. Patient making fair progress toward goals, will continue to follow POC. Discharge recommendation remains appropriate.        If plan is discharge home, recommend the following:  A little help with walking and/or transfers;A lot of help with bathing/dressing/bathroom;Assistance with cooking/housework;Assist for transportation;Help with stairs or ramp for entrance   Equipment Recommendations  None recommended by OT    Recommendations for Other Services      Precautions / Restrictions Precautions Precautions: Fall Recall of Precautions/Restrictions: Intact Restrictions Weight Bearing Restrictions Per Provider Order: No       Mobility Bed Mobility Overal bed mobility: Needs Assistance Bed Mobility: Supine to Sit, Sit to Supine     Supine to sit: Mod assist, HOB elevated, Used rails Sit to supine: Mod assist, Used rails, HOB  elevated   General bed mobility comments: A to move LLE off/on bed    Transfers                   General transfer comment: refused due to fatigue     Balance Overall balance assessment: Needs assistance, History of Falls Sitting-balance support: Feet supported Sitting balance-Leahy Scale: Fair Sitting balance - Comments: prefers UE support, can tolerate without Postural control: Left lateral lean                                 ADL either performed or assessed with clinical judgement   ADL Overall ADL's : Needs assistance/impaired                 Upper Body Dressing : Sitting;Maximal assistance Upper Body Dressing Details (indicate cue type and reason): required A to don gown due to fatigue                        Extremity/Trunk Assessment              Vision       Perception     Praxis     Communication Communication Communication: Impaired Factors Affecting Communication: Hearing impaired   Cognition Arousal: Alert Behavior During Therapy: WFL for tasks assessed/performed Cognition: No apparent impairments                               Following commands: Intact  Cueing   Cueing Techniques: Verbal cues  Exercises      Shoulder Instructions       General Comments patient not wearing O2 at start of tx, O2 82%, O2 donned and O2 increased to 90% in <30 seconds    Pertinent Vitals/ Pain       Pain Assessment Pain Assessment: 0-10 Pain Score: 5  Pain Location: generalized Pain Descriptors / Indicators: Discomfort Pain Intervention(s): Monitored during session  Home Living                                          Prior Functioning/Environment              Frequency  Min 2X/week        Progress Toward Goals  OT Goals(current goals can now be found in the care plan section)  Progress towards OT goals: Progressing toward goals  Acute Rehab OT Goals Patient  Stated Goal: to get stronger OT Goal Formulation: With patient Time For Goal Achievement: 11/30/24 Potential to Achieve Goals: Good ADL Goals Pt Will Perform Grooming: with supervision;standing Pt Will Perform Lower Body Dressing: with supervision;sit to/from stand Pt Will Transfer to Toilet: with supervision;ambulating;bedside commode Additional ADL Goal #1: Pt will demonstrate pursed lip breathing for ADL tasks to reduce c/o DOE  Plan      Co-evaluation                 AM-PAC OT 6 Clicks Daily Activity     Outcome Measure   Help from another person eating meals?: None Help from another person taking care of personal grooming?: A Little Help from another person toileting, which includes using toliet, bedpan, or urinal?: A Lot Help from another person bathing (including washing, rinsing, drying)?: A Lot Help from another person to put on and taking off regular upper body clothing?: A Little Help from another person to put on and taking off regular lower body clothing?: A Lot 6 Click Score: 16    End of Session Equipment Utilized During Treatment: Oxygen  OT Visit Diagnosis: Unsteadiness on feet (R26.81);Other abnormalities of gait and mobility (R26.89);Muscle weakness (generalized) (M62.81);History of falling (Z91.81)   Activity Tolerance Patient limited by fatigue   Patient Left in bed;with call bell/phone within reach;with bed alarm set;with family/visitor present   Nurse Communication Mobility status        Time: 8497-8482 OT Time Calculation (min): 15 min  Charges: OT General Charges $OT Visit: 1 Visit OT Treatments $Self Care/Home Management : 8-22 mins  Rogers Clause, OT/L MSOT, 11/18/2024

## 2024-11-18 NOTE — Progress Notes (Signed)
 PROGRESS NOTE    Jenny [name]   FMW:[id number] DOB: May 11, 1933  DOA: 11/14/2024 Date of Service: 11/18/2024 which is hospital day 4  PCP: [facility]     Hospital course / significant events:   HPI: Jenny [name] is a 89 y.o. female with medical history significant for HFpEF, hypertension, persistent A-fib on Eliquis , tachybradycardia syndrome, CAD, COPD, CKD stage 3b, type 2 diabetes, anemia. Presents to ED from [facility] (rehab), 3 days LE edema and R leg pain 1 day, also SOB  12/07: to ED early hours AM. CTA chest showed tiny segmental PE left lower lobe without heart strain, extensive multivessel CAD and mild asymmetric pulmonary edema around the left hilum. Initially was treated with DuoNebs and Solu-Medrol . She initially received LR boluses but subsequently given a dose of Lasix  as test results came in. Started on vancomycin  and Rocephin  and also on a heparin  infusion for PE. Admitted with question sepsis d/t lower extremity cellulitis, vs non-infectious SIRS multifactorial including acute dyspnea secondary to COPD and CHF exacerbation with a segmental PE seen on CTA chest. Podiatry consulted re: venous stasis ulcers on feet - bedisde debridement, appear superficial and recs for daily dressing changes, ACE wraps to knee, po abx on dc, WBAT, f/u outpatient 1 week after discharge   12/08: Vascular surgery consult d/t decreased ABI / PE> No thrombectomy given small PE. RLE angio planned for 12/10 12/09: stable 12/10: RLE angio and stents today. Cr 1.99 12/11: renal fxn improving Cr 1.56. Vascular surgery plans on taking the patient to the vascular lab on Monday, 11/22/2024 for a right lower extremity angiogram - pt to remain on heparin  infusion until after procedure      Consultants:  Podiatry Vascular Surgery  Palliative Care   Procedures/Surgeries: RLE angiogram and stents 11/17/24       ASSESSMENT & PLAN:   LE edema / erythema   Multifactorial, include Venous stasis dermatitis / ulceration  Cellulitis  Peripheral arterial disease LE edema d/t CHF / HFpEF exacerbation  Treat underlying cause(s) as noted   Venous stasis with associated stasis ulcers lower extremities bilaterally peripheral arterial disease  S/p angiogram RLE w/ stenting 11/17/24 Vascular surgery following daily dressing changes, ACE wraps to knee Continue keflex  for now 7-10d total  WBAT f/u podiatry outpatient 1 week after discharge Vascular surgery plans on taking the patient to the vascular lab on Monday, 11/22/2024 for a right lower extremity angiogram - pt to remain on heparin  infusion until after procedure     Question Cellulitis Continue Keflex  7-10d total    Acute PE Very small.  On apixaban  2.5 bid at baseline, patient unsure if she's been receiving regularly at snf. Given cr under 1.5 assumes stays there would warrant full dose of 5 bid heparin  for now pending vascular angio See pharmacy note 12/11 for eliquis  - test claim for eliquis  5 mg bis $85   HFpEF with acute exacerbation Recent hospitalization with dose reduction in diuretic, likely under-diuresed (discharged on lasix  40, had previously been on bid dosing). Cr up from admission after IV diuresis, but stable lasix  40 mg oral daily - titrate as able /as needed    Hyperkalemia K 5.3 treated with lokelma  improved Monitor BMP   AKI on ckd 3b Cr 1.39 on admission --> peak at 2.09 on 12/08 --> today 11/18/2024 1.56 resuming oral lasix , would consider hold if kidney function worsens again   COPD  not exacerbated.  Arrives w/ prednisone  20 on  med list, says has been maintained on that for a couple of months decrease prednisone  to 10 daily, at that dose doesn't need pjp ppx will need close outpt pulm f/u   History tachy-brady A-fib Rate controlled currently heparin  as above Resume eliquis  on discharge as above - See pharmacy note 12/11 for eliquis  - test claim for  eliquis  5 mg bis $85   Elevated troponin, ACS ruled out  Mild, stable, flat, likely demand as no chest pain Recheck troponin / EKG prn chest pain.    Neuropathy home gabapentin    Pulm nodule outpt f/u   Debility Comes from rehab PT consulted patient does not want to return to [facility]. TOC aware, looking elsewhere but if refusing available facility then will have to DC to home instead if unable to arrange alternative placement     Class 2 / borderline Class 3 obesity based on BMI: Body mass index is 38.1 kg/m.Jenny [name] Significantly low or high BMI is associated with higher medical risk.  Underweight - under 18  overweight - 25 to 29 obese - 30 or more Class 1 obesity: BMI of 30.0 to 34 Class 2 obesity: BMI of 35.0 to 39 Class 3 obesity: BMI of 40.0 to 49 Super Morbid Obesity: BMI 50-59 Super-super Morbid Obesity: BMI 60+ Healthy nutrition and physical activity advised as adjunct to other disease management and risk reduction treatments    DVT prophylaxis: heparin  IV fluids: no continuous IV fluids  Nutrition: cardiac diet  Central lines / other devices: none  Code Status: DNR ACP documentation reviewed:  none on file in [facility]  [facility] needs: SNF rehab placement Medical barriers to dispo: recovery post angio. Expected medical readiness for discharge Monday after her next angio then .              Subjective / Brief ROS:  Patient reports feeling well following procedure ok today no complaints  Pain controlled.  Denies new weakness.  Tolerating diet.  Reports no concerns w/ urination/defecation.   Family Communication: none at this time will call son later today and update this note if able to reach him     Objective Findings:  Vitals:   11/17/24 2356 11/18/24 0302 11/18/24 0533 11/18/24 1151  BP: 122/64 (!) 141/63  110/88  Pulse: 95 66  86  Resp: 20 20  20   Temp: 98.1 F (36.7 C) 98.1 F (36.7 C)  98.6 F (37 C)  TempSrc: Oral   Oral  SpO2:  99% 93%    Weight:   87.8 kg   Height:        Intake/Output Summary (Last 24 hours) at 11/18/2024 1416 Last data filed at 11/18/2024 1154 Gross per 24 hour  Intake 480 ml  Output 2500 ml  Net -2020 ml   Filed Weights   11/16/24 0500 11/17/24 0500 11/18/24 0533  Weight: 92.5 kg 94.5 kg 87.8 kg    Examination:  Physical Exam Constitutional:      General: She is not in acute distress. Cardiovascular:     Rate and Rhythm: Normal rate and regular rhythm.  Pulmonary:     Effort: Pulmonary effort is normal.     Breath sounds: Normal breath sounds.  Skin:    General: Skin is warm and dry.  Neurological:     Mental Status: She is alert.  Psychiatric:        Mood and Affect: Mood normal.        Behavior: Behavior normal.  Scheduled Medications:   arformoterol   15 mcg Nebulization BID   And   umeclidinium bromide   1 puff Inhalation Daily   cephALEXin   500 mg Oral Q8H   dorzolamide   1 drop Both Eyes BID   furosemide   40 mg Oral Daily   gabapentin   100 mg Oral Daily   guaiFENesin   600 mg Oral BID   hydrocerin   Topical BID   latanoprost   1 drop Both Eyes QHS   pantoprazole   40 mg Oral Daily   predniSONE   10 mg Oral Q breakfast   rosuvastatin   20 mg Oral Daily    Continuous Infusions:  heparin  950 Units/hr (11/18/24 1347)    PRN Medications:  acetaminophen  **OR** acetaminophen , albuterol , hydrOXYzine , ondansetron  **OR** ondansetron  (ZOFRAN ) IV, mouth rinse, oxyCODONE   Antimicrobials from admission:  Anti-infectives (From admission, onward)    Start     Dose/Rate Route Frequency Ordered Stop   11/17/24 1112  ceFAZolin  (ANCEF ) IVPB 2g/100 mL premix        2 g 200 mL/hr over 30 Minutes Intravenous 30 min pre-op 11/17/24 1112 11/17/24 1257   11/16/24 1400  cephALEXin  (KEFLEX ) capsule 500 mg        500 mg Oral Every 8 hours 11/16/24 1157     11/16/24 0200  vancomycin  (VANCOREADY) IVPB 1250 mg/250 mL  Status:  Discontinued        1,250 mg 166.7 mL/hr  over 90 Minutes Intravenous Every 48 hours 11/14/24 0522 11/14/24 0901   11/15/24 0130  cefTRIAXone  (ROCEPHIN ) 1 g in sodium chloride  0.9 % 100 mL IVPB  Status:  Discontinued        1 g 200 mL/hr over 30 Minutes Intravenous Every 24 hours 11/14/24 0448 11/14/24 0901   11/14/24 2200  ceFAZolin  (ANCEF ) IVPB 1 g/50 mL premix  Status:  Discontinued        1 g 100 mL/hr over 30 Minutes Intravenous Every 8 hours 11/14/24 0920 11/16/24 1157   11/14/24 1400  ceFAZolin  (ANCEF ) IVPB 1 g/50 mL premix  Status:  Discontinued        1 g 100 mL/hr over 30 Minutes Intravenous Every 8 hours 11/14/24 0919 11/14/24 0920   11/14/24 0500  vancomycin  (VANCOREADY) IVPB 1500 mg/300 mL        1,500 mg 150 mL/hr over 120 Minutes Intravenous  Once 11/14/24 0457 11/14/24 0805   11/14/24 0115  cefTRIAXone  (ROCEPHIN ) 2 g in sodium chloride  0.9 % 100 mL IVPB        2 g 200 mL/hr over 30 Minutes Intravenous Once 11/14/24 0108 11/14/24 0201   11/14/24 0115  vancomycin  (VANCOCIN ) IVPB 1000 mg/200 mL premix        1,000 mg 200 mL/hr over 60 Minutes Intravenous  Once 11/14/24 0108 11/14/24 0319           Data Reviewed:  I have personally reviewed the following...  CBC: Recent Labs  Lab 11/14/24 0022 11/15/24 0509 11/16/24 0649 11/17/24 0452 11/18/24 0421  WBC 10.7* 11.8* 7.7 5.9 6.0  NEUTROABS 9.4*  --   --   --   --   HGB 9.8* 8.7* 8.5* 8.4* 8.7*  HCT 32.6* 28.9* 28.3* 28.5* 29.5*  MCV 85.8 84.8 84.5 86.6 85.3  PLT 239 209 166 242 283   Basic Metabolic Panel: Recent Labs  Lab 11/14/24 0022 11/15/24 0509 11/15/24 1410 11/16/24 0649 11/18/24 0421  NA 135 134* 133* 133* 140  K 5.1 5.3* 5.4* 4.9 4.4  CL 98 96* 95* 97* 99  CO2 25 24 22 25  32  GLUCOSE 127* 174* 184* 128* 92  BUN 43* 48* 50* 52* 43*  CREATININE 1.39* 2.00* 2.09* 1.99* 1.56*  CALCIUM  8.5* 8.7* 8.9 8.8* 8.3*   GFR: Estimated Creatinine Clearance: 25.2 mL/min (A) (by C-G formula based on SCr of 1.56 mg/dL (H)). Liver Function  Tests: Recent Labs  Lab 11/14/24 0022  AST 17  ALT 20  ALKPHOS 129*  BILITOT 0.3  PROT 6.1*  ALBUMIN 3.7   No results for input(s): LIPASE, AMYLASE in the last 168 hours. No results for input(s): AMMONIA in the last 168 hours. Coagulation Profile: Recent Labs  Lab 11/14/24 0354  INR 1.3*   Cardiac Enzymes: No results for input(s): CKTOTAL, CKMB, CKMBINDEX, TROPONINI in the last 168 hours. BNP (last 3 results) Recent Labs    11/14/24 0022  PROBNP 2,277.0*   HbA1C: No results for input(s): HGBA1C in the last 72 hours. CBG: Recent Labs  Lab 11/17/24 1343 11/17/24 1644 11/17/24 2105 11/18/24 0808 11/18/24 1157  GLUCAP 151* 227* 201* 112* 181*   Lipid Profile: No results for input(s): CHOL, HDL, LDLCALC, TRIG, CHOLHDL, LDLDIRECT in the last 72 hours. Thyroid  Function Tests: No results for input(s): TSH, T4TOTAL, FREET4, T3FREE, THYROIDAB in the last 72 hours. Anemia Panel: No results for input(s): VITAMINB12, FOLATE, FERRITIN, TIBC, IRON , RETICCTPCT in the last 72 hours. Most Recent Urinalysis On File:     Component Value Date/Time   COLORURINE YELLOW 10/06/2024 2103   APPEARANCEUR CLEAR 10/06/2024 2103   LABSPEC 1.008 10/06/2024 2103   PHURINE 6.0 10/06/2024 2103   GLUCOSEU NEGATIVE 10/06/2024 2103   HGBUR MODERATE (A) 10/06/2024 2103   BILIRUBINUR NEGATIVE 10/06/2024 2103   KETONESUR NEGATIVE 10/06/2024 2103   PROTEINUR NEGATIVE 10/06/2024 2103   NITRITE NEGATIVE 10/06/2024 2103   LEUKOCYTESUR NEGATIVE 10/06/2024 2103   Sepsis Labs: @LABRCNTIP (procalcitonin:4,lacticidven:4) Microbiology: Recent Results (from the past 240 hours)  Blood culture (routine x 2)     Status: None (Preliminary result)   Collection Time: 11/14/24  1:22 AM   Specimen: BLOOD  Result Value Ref Range Status   Specimen Description BLOOD RIGHT ANTECUBITAL  Final   Special Requests   Final    BOTTLES DRAWN AEROBIC AND ANAEROBIC Blood  Culture results may not be optimal due to an inadequate volume of blood received in culture bottles   Culture   Final    NO GROWTH 4 DAYS Performed at [facility], [address] [place], [place] [address]    Report Status PENDING  Incomplete  Blood culture (routine x 2)     Status: None (Preliminary result)   Collection Time: 11/14/24  1:22 AM   Specimen: BLOOD  Result Value Ref Range Status   Specimen Description BLOOD BLOOD LEFT FOREARM  Final   Special Requests   Final    BOTTLES DRAWN AEROBIC AND ANAEROBIC Blood Culture adequate volume   Culture   Final    NO GROWTH 4 DAYS Performed at [facility], [address], [place], [place] [address]    Report Status PENDING  Incomplete  Resp panel by RT-PCR (RSV, Flu A&B, Covid) Anterior Nasal Swab     Status: None   Collection Time: 11/14/24  1:22 AM   Specimen: Anterior Nasal Swab  Result Value Ref Range Status   SARS Coronavirus 2 by RT PCR NEGATIVE NEGATIVE Final    Comment: (NOTE) SARS-CoV-2 target nucleic acids are NOT DETECTED.  The SARS-CoV-2 RNA is generally detectable in upper respiratory specimens  during the acute phase of infection. The lowest concentration of SARS-CoV-2 viral copies this assay can detect is 138 copies/mL. A negative result does not preclude SARS-Cov-2 infection and should not be used as the sole basis for treatment or other patient management decisions. A negative result may occur with  improper specimen collection/handling, submission of specimen other than nasopharyngeal swab, presence of viral mutation(s) within the areas targeted by this assay, and inadequate number of viral copies(<138 copies/mL). A negative result must be combined with clinical observations, patient history, and epidemiological information. The expected result is Negative.  Fact Sheet for Patients:  bloggercourse.com  Fact Sheet for Healthcare Providers:   seriousbroker.it  This test is no t yet approved or cleared by the [place]  FDA and  has been authorized for detection and/or diagnosis of SARS-CoV-2 by FDA under an Emergency Use Authorization (EUA). This EUA will remain  in effect (meaning this test can be used) for the duration of the COVID-19 declaration under Section 564(b)(1) of the Act, 21 U.S.C.section 360bbb-3(b)(1), unless the authorization is terminated  or revoked sooner.       Influenza A by PCR NEGATIVE NEGATIVE Final   Influenza B by PCR NEGATIVE NEGATIVE Final    Comment: (NOTE) The Xpert Xpress SARS-CoV-2/FLU/RSV plus assay is intended as an aid in the diagnosis of influenza from Nasopharyngeal swab specimens and should not be used as a sole basis for treatment. Nasal washings and aspirates are unacceptable for Xpert Xpress SARS-CoV-2/FLU/RSV testing.  Fact Sheet for Patients: bloggercourse.com  Fact Sheet for Healthcare Providers: seriousbroker.it  This test is not yet approved or cleared by the [place]  FDA and has been authorized for detection and/or diagnosis of SARS-CoV-2 by FDA under an Emergency Use Authorization (EUA). This EUA will remain in effect (meaning this test can be used) for the duration of the COVID-19 declaration under Section 564(b)(1) of the Act, 21 U.S.C. section 360bbb-3(b)(1), unless the authorization is terminated or revoked.     Resp Syncytial Virus by PCR NEGATIVE NEGATIVE Final    Comment: (NOTE) Fact Sheet for Patients: bloggercourse.com  Fact Sheet for Healthcare Providers: seriousbroker.it  This test is not yet approved or cleared by the [place]  FDA and has been authorized for detection and/or diagnosis of SARS-CoV-2 by FDA under an Emergency Use Authorization (EUA). This EUA will remain in effect (meaning this test can be used) for  the duration of the COVID-19 declaration under Section 564(b)(1) of the Act, 21 U.S.C. section 360bbb-3(b)(1), unless the authorization is terminated or revoked.  Performed at [facility], [address], [place], [place] [address]       Radiology Studies last 3 days: PERIPHERAL VASCULAR CATHETERIZATION Result Date: 11/17/2024 See surgical note for result.  US  ARTERIAL ABI (SCREENING LOWER EXTREMITY) Result Date: 11/14/2024 CLINICAL DATA:  Tobacco abuse, hypertension, claudication, diabetes, hyperlipidemia EXAM: NONINVASIVE PHYSIOLOGIC VASCULAR STUDY OF BILATERAL LOWER EXTREMITIES TECHNIQUE: Evaluation of both lower extremities were performed at rest, including calculation of ankle-brachial indices with single level Doppler, pressure and pulse volume recording. COMPARISON:  08/23/2021 FINDINGS: Right ABI:  0.7 Left ABI:  0.8 Right Lower Extremity:  Abnormal monophasic arterial waveforms. Left Lower Extremity:  Abnormal monophasic arterial waveforms. 0.5-0.79 Moderate PAD IMPRESSION: 1. Abnormal ABI and arterial waveforms within the bilateral lower extremities, consistent with moderate underlying peripheral arterial disease. Electronically Signed   By: Ozell Daring M.D.   On: 11/14/2024 18:00         Laneta Blunt, DO [facility] 11/18/2024, 2:16  PM    Dictation software may have been used to generate the above note. Typos may occur and escape review in typed/dictated notes. Please contact Dr Marsa directly for clarity if needed.  Staff may message me via secure chat in Epic  but this may not receive an immediate response,  please page me for urgent matters!  If 7PM-7AM, please contact night coverage [email or web address]

## 2024-11-18 NOTE — Progress Notes (Signed)
 Daily Progress Note   Date: 11/18/2024   Patient Name: Jenny [name]  DOB: 1933/08/30  MRN: [id number]  Age / Sex: 89 y.o., female  Attending Physician: Marsa Edelman, DO Primary Care Physician: [facility]  Admit Date: 11/14/2024 Length of Stay: 4 days  Reason for Follow-up: Establishing goals of care  Past Medical History:  Diagnosis Date   ACUT DUOD ULCER W/HEMORR&PERF W/O MENTION OBST 10/05/2009   NSAID induced   ALLERGIC RHINITIS CAUSE UNSPECIFIED    ANEMIA-NOS    CAD (coronary artery disease) 06/08/2009   DEs RCA with 70% LAD and EF 60%   CHF (congestive heart failure) (HCC)    COPD    mild obst on PFTs 03/2010   Diabetes mellitus 06/2010 dx   Mild, diet controlled   GLAUCOMA    HYPERLIPIDEMIA    HYPERTENSION, BENIGN    MYOCARDIAL INFARCTION 06/08/2009   des to rca   Persistent atrial fibrillation (HCC)    Dx 08/2021   TOBACCO ABUSE     Subjective:   Subjective: Chart Reviewed. Updates received. Patient Assessed. Created space and opportunity for patient  and family to explore thoughts and feelings regarding current medical situation.  Today's Discussion: Today before meeting with the patient/family, I reviewed the chart notes including vascular surgery notes from yesterday, vascular surgery op note from yesterday, hospitalist note from yesterday, hospitalist note from today, vascular surgery note from today. I also reviewed vital signs, nursing flowsheets, medication administrations record, labs, and imaging. Labs reviewed include CBC which shows stable hemoglobin at 8.7 postprocedure and vascular intervention.  White count remains normal at 6.0 in the setting of lower extremity wounds.  Today saw the patient at bedside, no family present.  She states she is feeling okay today, pain in her right leg is better but her left foot is still hurting some.  We discussed her vascular surgery procedure yesterday where they did an angiogram and  successfully deployed 2 stents to the right side.  Explained is likely why her right leg is feeling better.  We talked about her improvement in creatinine/kidney function and I told her that I did receive the HCPOA documentation from her son and uploaded into the epic system so should be available in the future at any coming facility.  At this point the vascular surgeon entered to see her.  He talked about the successful stent placement and shares that they need to stent her left leg as well, which I shared is consistent with the discussion we had about her right leg feeling better her left leg still hurting.  Vascular surgery plans to keep her here and schedule vascular intervention and stenting on Monday.  Patient is in agreement.  Vascular surgery left at this point.  Finally, we talked about pending SNF/rehab at discharge along with outpatient palliative care.  The patient is agreement with both of these.  She states hopefully it will help this old lady be able to walk again.  I shared that [facility] sent to the list of bed offers to her son and I encouraged her to speak with him to talk through where they feel would be most appropriate.  I shared that because she is improving so well and planned stenting on Monday and likely discharge to SNF/rehab after that the palliative medicine would back off at this point.  We are available for any needs in the future or significant declines.  I provided emotional and general support through therapeutic listening, empathy,  sharing of stories, and other techniques. I answered all questions and addressed all concerns to the best of my ability.  After seeing the patient I called and updated her son on plan per vascular and an update on his mom's condition.  Discussed plan for procedure on Monday, possible discharge soon after that to SNF/rehab.  He has received a list of rehab facilities and is discussing this with his mother.  I shared the palliative medicine would  back off at this time but we are available for any significant changes or new needs.  Review of Systems  Respiratory:  Negative for shortness of breath.   Cardiovascular:  Negative for chest pain.  Gastrointestinal:  Negative for abdominal pain, nausea and vomiting.  Musculoskeletal:        Right leg pain improved, left leg still uncomfortable    Objective:   Primary Diagnoses: Present on Admission:  Acute on chronic heart failure with preserved ejection fraction (HFpEF) (HCC)  COPD with acute exacerbation (HCC)  Stage 3b chronic kidney disease (HCC)  Type II diabetes mellitus with peripheral circulatory disorder (HCC)  Tachycardia-bradycardia syndrome (HCC)  Bilateral lower leg cellulitis  CAD (coronary artery disease)  Essential hypertension  Type 2 diabetes mellitus with diabetic chronic kidney disease (HCC)  CKD (chronic kidney disease) stage 4, GFR 15-29 ml/min (HCC)  Sepsis (HCC)   Vital Signs:  BP (!) 141/63 (BP Location: Right Arm)   Pulse 66   Temp 98.1 F (36.7 C)   Resp 20   Ht 5' 2 (1.575 m)   Wt 87.8 kg   SpO2 93%   BMI 35.41 kg/m   Physical Exam Vitals and nursing note reviewed.  Constitutional:      General: She is not in acute distress.    Appearance: She is ill-appearing. She is not toxic-appearing.  HENT:     Head: Normocephalic and atraumatic.  Cardiovascular:     Rate and Rhythm: Normal rate.  Pulmonary:     Effort: Pulmonary effort is normal. No respiratory distress.  Abdominal:     General: Abdomen is flat. There is no distension.     Palpations: Abdomen is soft.  Skin:    General: Skin is warm and dry.     Comments: Bilateral lower extremity dressings clean, dry, intact  Neurological:     General: No focal deficit present.     Mental Status: She is alert and oriented to person, place, and time. Mental status is at baseline.  Psychiatric:        Mood and Affect: Mood normal.        Behavior: Behavior normal.     Palliative  Assessment/Data: 40%   Existing Vynca/ACP Documentation: Advance directive signed 07/01/2012  Assessment & Plan:   HPI/Patient Profile:  89 y.o. female  with past medical history of HFpEF, hypertension, persistent A-fib on Eliquis , tachybradycardia syndrome, CAD, COPD, CKD stage 3b, type 2 diabetes, anemia who was admitted on 11/14/2024 with lower extremity cellulitis, SIRS, acute dyspnea secondary to COPD and CHF exacerbations, segmental PE, and others.    Palliative medicine was consulted for GOC conversations.  SUMMARY OF RECOMMENDATIONS   DNR-Limited Full scope of care otherwise Anticipate another vascular intervention Monday, 11/22/2024 Anticipate discharge to SNF/rehab after Monday Palliative medicine will back off as goals are clear Please notify us  of any significant clinical change or any palliative needs necessitating her reinvolvement  Symptom Management:  Per primary team Palliative medicine is available to assist as needed  Code Status: DNR -  Limited (DNR/DNI)  Prognosis: Unable to determine  Discharge Planning: Skilled Nursing Facility for rehab with Palliative care service follow-up  Discussed with: Patient, family, medical team, nursing team  Thank you for allowing us  to participate in the care of SHADANA PRY PMT will continue to support holistically.  Time Total: 60 min  Detailed review of medical records (labs, imaging, vital signs), medically appropriate exam, discussed with treatment team, counseling and education to patient, family, & staff, documenting clinical information, medication management, coordination of care  Camellia Kays, NP Palliative Medicine Team  Team Phone # [phone] (Nights/Weekends)  08/07/2021, 8:17 AM

## 2024-11-18 NOTE — Telephone Encounter (Signed)
 Patient Product/process Development Scientist completed.    The patient is insured through [employer]. Patient has Medicare and is not eligible for a copay card, but may be able to apply for patient assistance or Medicare RX Payment Plan (Patient Must reach out to their plan, if eligible for payment plan), if available.    Ran test claim for Eliquis  5 mg and the current 30 day co-pay is $85.29.   This test claim was processed through [facility]- copay amounts may vary at other pharmacies due to pharmacy/plan contracts, or as the patient moves through the different stages of their insurance plan.     Reyes Sharps, CPHT Pharmacy Technician Patient Advocate Specialist Lead [facility] Patient Advocate Team Direct Number: [phone]  Fax: [phone]

## 2024-11-18 NOTE — Progress Notes (Signed)
 PHARMACY - ANTICOAGULATION CONSULT NOTE  Pharmacy Consult for IV Heparin   Indication: pulmonary embolus  Patient Measurements: Height: 5' 2 (157.5 cm) Weight: 87.8 kg (193 lb 9.6 oz) IBW/kg (Calculated) : 50.1 HEPARIN  DW (KG): 71.4  Labs: Recent Labs    11/15/24 1410 11/15/24 2309 11/16/24 0649 11/16/24 1623 11/17/24 0155 11/17/24 0452 11/17/24 1052 11/17/24 2223 11/18/24 0421 11/18/24 1148  HGB  --    < > 8.5*  --   --  8.4*  --   --  8.7*  --   HCT  --   --  28.3*  --   --  28.5*  --   --  29.5*  --   PLT  --   --  166  --   --  242  --   --  283  --   APTT  --    < > 54*   < > 117*  --    < > 65* 81* 69*  HEPARINUNFRC  --   --  0.96*  --  1.01*  --   --  0.73*  --   --   CREATININE 2.09*  --  1.99*  --   --   --   --   --  1.56*  --    < > = values in this interval not displayed.   Estimated Creatinine Clearance: 25.2 mL/min (A) (by C-G formula based on SCr of 1.56 mg/dL (H)).  Medical History: Past Medical History:  Diagnosis Date   ACUT DUOD ULCER W/HEMORR&PERF W/O MENTION OBST 10/05/2009   NSAID induced   ALLERGIC RHINITIS CAUSE UNSPECIFIED    ANEMIA-NOS    CAD (coronary artery disease) 06/08/2009   DEs RCA with 70% LAD and EF 60%   CHF (congestive heart failure) (HCC)    COPD    mild obst on PFTs 03/2010   Diabetes mellitus 06/2010 dx   Mild, diet controlled   GLAUCOMA    HYPERLIPIDEMIA    HYPERTENSION, BENIGN    MYOCARDIAL INFARCTION 06/08/2009   des to rca   Persistent atrial fibrillation (HCC)    Dx 08/2021   TOBACCO ABUSE    Assessment: Pharmacy consulted to dose heparin  in this 89 year old female admitted with PE.  Pt was on Eliquis  2.5 mg PO BID PTA, last dose on 12/6 @ 2100.  Goal of Therapy:  Heparin  level 0.3-0.7 units/ml aPTT 66 - 102  seconds Monitor platelets by anticoagulation protocol: Yes   12/9 1623 aPTT 121s, supratherapeutic @ 1100 u/hr 12/10 0155 aPTT 117s, supratherapeutic / HL 1.01 12/10 1052 aPTT 69s, therapeutic x 1 12/10  2223 aPTT 0.65 HL 0.73.  12/11 0421 aPTT 81 12/11 1148 aPTT 69  Plan:  aPTT is therapeutic but lower end, trended down from prior. Will slightly increase heparin  infusion to 950 units/hr. Recheck aPTT 8 hours after rate change. Heparin  level and CBC daily.    Will M. Lenon, PharmD, BCPS Clinical Pharmacist 11/18/2024 12:30 PM

## 2024-11-19 DIAGNOSIS — I739 Peripheral vascular disease, unspecified: Secondary | ICD-10-CM | POA: Diagnosis not present

## 2024-11-19 DIAGNOSIS — I2699 Other pulmonary embolism without acute cor pulmonale: Secondary | ICD-10-CM | POA: Diagnosis not present

## 2024-11-19 LAB — CULTURE, BLOOD (ROUTINE X 2)
Culture: NO GROWTH
Culture: NO GROWTH
Special Requests: ADEQUATE

## 2024-11-19 LAB — HEPARIN LEVEL (UNFRACTIONATED)
Heparin Unfractionated: 0.69 [IU]/mL (ref 0.30–0.70)
Heparin Unfractionated: 0.82 [IU]/mL — ABNORMAL HIGH (ref 0.30–0.70)
Heparin Unfractionated: 0.47 [IU]/mL (ref 0.30–0.70)

## 2024-11-19 LAB — CBC
HCT: 29 % — ABNORMAL LOW (ref 36.0–46.0)
Hemoglobin: 8.5 g/dL — ABNORMAL LOW (ref 12.0–15.0)
MCH: 25.3 pg — ABNORMAL LOW (ref 26.0–34.0)
MCHC: 29.3 g/dL — ABNORMAL LOW (ref 30.0–36.0)
MCV: 86.3 fL (ref 80.0–100.0)
Platelets: 317 K/uL (ref 150–400)
RBC: 3.36 MIL/uL — ABNORMAL LOW (ref 3.87–5.11)
RDW: 19.5 % — ABNORMAL HIGH (ref 11.5–15.5)
WBC: 7.4 K/uL (ref 4.0–10.5)
nRBC: 3 % — ABNORMAL HIGH (ref 0.0–0.2)

## 2024-11-19 LAB — BASIC METABOLIC PANEL WITH GFR
Anion gap: 10 (ref 5–15)
BUN: 42 mg/dL — ABNORMAL HIGH (ref 8–23)
CO2: 30 mmol/L (ref 22–32)
Calcium: 8.8 mg/dL — ABNORMAL LOW (ref 8.9–10.3)
Chloride: 99 mmol/L (ref 98–111)
Creatinine, Ser: 1.56 mg/dL — ABNORMAL HIGH (ref 0.44–1.00)
GFR, Estimated: 31 mL/min — ABNORMAL LOW (ref >60)
Glucose, Bld: 121 mg/dL — ABNORMAL HIGH (ref 70–99)
Potassium: 4.1 mmol/L (ref 3.5–5.1)
Sodium: 139 mmol/L (ref 135–145)

## 2024-11-19 LAB — APTT: aPTT: 137 s — ABNORMAL HIGH (ref 24–36)

## 2024-11-19 LAB — GLUCOSE, CAPILLARY
Glucose-Capillary: 109 mg/dL — ABNORMAL HIGH (ref 70–99)
Glucose-Capillary: 188 mg/dL — ABNORMAL HIGH (ref 70–99)
Glucose-Capillary: 134 mg/dL — ABNORMAL HIGH (ref 70–99)

## 2024-11-19 MED ORDER — POLYETHYLENE GLYCOL 3350 17 G PO PACK
17.0000 g | PACK | Freq: Every day | ORAL | Status: DC
  Administered 2024-11-19 – 2024-12-03 (×8): 17 g via ORAL
  Filled 2024-11-19 (×14): qty 1

## 2024-11-19 NOTE — Progress Notes (Signed)
 PHARMACY - ANTICOAGULATION CONSULT NOTE  Pharmacy Consult for IV Heparin   Indication: pulmonary embolus  Patient Measurements: Height: 5' 2 (157.5 cm) Weight: 88.3 kg (194 lb 11.2 oz) IBW/kg (Calculated) : 50.1 HEPARIN  DW (KG): 71.4  Labs: Recent Labs    11/16/24 0649 11/16/24 1623 11/17/24 0155 11/17/24 0452 11/17/24 1052 11/17/24 2223 11/18/24 0421 11/18/24 1148 11/18/24 1922 11/19/24 0406  HGB 8.5*  --   --  8.4*  --   --  8.7*  --   --  8.5*  HCT 28.3*  --   --  28.5*  --   --  29.5*  --   --  29.0*  PLT 166  --   --  242  --   --  283  --   --  317  APTT 54*   < > 117*  --    < > 65* 81* 69* 101* 137*  HEPARINUNFRC 0.96*  --  1.01*  --   --  0.73*  --   --   --  0.69  CREATININE 1.99*  --   --   --   --   --  1.56*  --   --  1.56*   < > = values in this interval not displayed.   Estimated Creatinine Clearance: 25.2 mL/min (A) (by C-G formula based on SCr of 1.56 mg/dL (H)).  Medical History: Past Medical History:  Diagnosis Date   ACUT DUOD ULCER W/HEMORR&PERF W/O MENTION OBST 10/05/2009   NSAID induced   ALLERGIC RHINITIS CAUSE UNSPECIFIED    ANEMIA-NOS    CAD (coronary artery disease) 06/08/2009   DEs RCA with 70% LAD and EF 60%   CHF (congestive heart failure) (HCC)    COPD    mild obst on PFTs 03/2010   Diabetes mellitus 06/2010 dx   Mild, diet controlled   GLAUCOMA    HYPERLIPIDEMIA    HYPERTENSION, BENIGN    MYOCARDIAL INFARCTION 06/08/2009   des to rca   Persistent atrial fibrillation (HCC)    Dx 08/2021   TOBACCO ABUSE    Assessment: Pharmacy consulted to dose heparin  in this 89 year old female admitted with PE.  Pt was on Eliquis  2.5 mg PO BID PTA, last dose on 12/6 @ 2100.  Goal of Therapy:  Heparin  level 0.3-0.7 units/ml aPTT 66 - 102  seconds Monitor platelets by anticoagulation protocol: Yes   12/9 1623 aPTT 121s, supratherapeutic @ 1100 u/hr 12/10 0155 aPTT 117s, supratherapeutic / HL 1.01 12/10 1052 aPTT 69s, therapeutic x 1 12/10  2223 aPTT 65, HL 0.73.  12/11 0421 aPTT 81 12/11 1148 aPTT 69 12/11 1922 aPTT 101, thera x 1; 950 un/h  12/12 0406 aPTT 137, HL 0.69   Plan:  --aPTT is supratherapeutic but heparin  level is therapeutic --spoke to nurse, was uncertain if level was drawn correctly by lab, but patient has not over signs or symptoms of bleeding, CBC stable --since heparin  is trending down re-check both aPTT and HL in 8 hours and re-address correlation --Daily CBC per protocol while on IV heparin    Galya Dunnigan A Keghan Mcfarren, PharmD Clinical Pharmacist 11/19/2024 5:59 AM

## 2024-11-19 NOTE — Progress Notes (Signed)
 Pt called out stating she was feeling short of breath, some wheezing noted. Congested cough, RT called to inform and request breathing treatment. Pt stated her room felt hot, temperature adjusted.

## 2024-11-19 NOTE — TOC Progression Note (Signed)
 Transition of Care [facility]) - Progression Note    Patient Details  Name: Jenny [name] MRN: [id number] Date of Birth: 13-Jul-1933  Transition of [facility]) CM/SW Contact  [name], LCSW Phone Number: 11/19/2024, 8:46 AM  Clinical Narrative:    Late note 11/18/24: Jenny [name] Rummer met with pt and pt son Jenny [name] after TOC hours to discuss discharge planning. LCSW A Aamna Mallozzi provided pt and son snf options. Pt and son report mothers rental home has black mold son is working to get the issue resolved. Pt son verbalized to Ms. Jenny [name] he will not make a decision regarding choosing a snf and that decision is only for [name] to make.    Expected Discharge Plan: Skilled Nursing Facility Barriers to Discharge: Continued Medical Work up               Expected Discharge Plan and Services     Post Acute Care Choice: Skilled Nursing Facility Living arrangements for the past 2 months: Skilled Nursing Facility, Single Family Home                                       Social Drivers of Health (SDOH) Interventions SDOH Screenings   Food Insecurity: No Food Insecurity (11/15/2024)  Housing: Low Risk (11/15/2024)  Transportation Needs: No Transportation Needs (11/15/2024)  Utilities: Not At Risk (11/15/2024)  Alcohol Screen: Low Risk (07/02/2024)  Depression (PHQ2-9): Low Risk (07/22/2022)  Financial Resource Strain: Low Risk (07/02/2024)  Social Connections: Socially Isolated (11/15/2024)  Tobacco Use: Medium Risk (11/15/2024)    Readmission Risk Interventions    07/05/2024   10:24 AM  Readmission Risk Prevention Plan  Transportation Screening Complete  HRI or Home Care Consult Complete  Social Work Consult for Recovery Care Planning/Counseling Complete  Palliative Care Screening Not Applicable  Medication Review Oceanographer) Complete

## 2024-11-19 NOTE — Progress Notes (Signed)
 Mobility Specialist - Progress Note  Pre-mobility: SpO2-99%  During mobility:  SpO2-98%  Post-mobility:SPO2-99%   11/19/24 0900  Mobility  Activity Stood at bedside;Ambulated with assistance;Pivoted/transferred from bed to chair;Dangled on edge of bed;Respositioned in chair  Level of Assistance Maximum assist, patient does 25-49%  Assistive Device Front wheel walker  Distance Ambulated (ft) 2 ft  Range of Motion/Exercises All extremities  Activity Response Tolerated fair  Mobility visit 1 Mobility  Mobility Specialist Start Time (ACUTE ONLY) 0920  Mobility Specialist Stop Time (ACUTE ONLY) [id number]  Mobility Specialist Time Calculation (min) (ACUTE ONLY) 18 min   Pt was supine in bed with the HOB elevated on O2 @ 2L. Pt agreed to mobility. Pt O2 vitals were taken throughout activity as a precaution. Pt is able today to get to the EOB with modA CGA. Pt is able today to pivot to the recliner with maxA CGA. Pt was able to dangle feet off the bed and position in the recliner. Pt is in the recliner with needs in reach and chair alarm on. Pt vitals remained WNL throughout activity.  Clem Rodes Mobility Specialist 11/19/2024, 9:57 AM

## 2024-11-19 NOTE — Progress Notes (Signed)
 PROGRESS NOTE   Jenny [name]   FMW:[id number] DOB: 05/30/1933  DOA: 11/14/2024 Date of Service: 11/19/2024 which is hospital day 5  PCP: [facility]    Hospital course / significant events:   HPI: Jenny [name] is a 89 y.o. female with medical history significant for HFpEF, hypertension, persistent A-fib on Eliquis , tachybradycardia syndrome, CAD, COPD, CKD stage 3b, type 2 diabetes, anemia. Presents to ED from [facility] (rehab), 3 days LE edema and R leg pain 1 day, also SOB  12/07: to ED early hours AM. CTA chest showed tiny segmental PE left lower lobe without heart strain, extensive multivessel CAD and mild asymmetric pulmonary edema around the left hilum. Initially was treated with DuoNebs and Solu-Medrol . She initially received LR boluses but subsequently given a dose of Lasix  as test results came in. Started on vancomycin  and Rocephin  and also on a heparin  infusion for PE. Admitted with question sepsis d/t lower extremity cellulitis, vs non-infectious SIRS multifactorial including acute dyspnea secondary to COPD and CHF exacerbation with a segmental PE seen on CTA chest. Podiatry consulted re: venous stasis ulcers on feet - bedisde debridement, appear superficial and recs for daily dressing changes, ACE wraps to knee, po abx on dc, WBAT, f/u outpatient 1 week after discharge   12/08: Vascular surgery consult d/t decreased ABI / PE> No thrombectomy given small PE. RLE angio planned for 12/10 12/09: stable 12/10: RLE angio and stents today. Cr 1.99 12/11: renal fxn improving Cr 1.56. Vascular surgery plans on taking the patient to the vascular lab on Monday, 11/22/2024 for a right lower extremity angiogram - pt to remain on heparin  infusion until after procedure  12/12: doing well / stable.      Consultants:  Podiatry Vascular Surgery  Palliative Care   Procedures/Surgeries: RLE angiogram and stents 11/17/24       ASSESSMENT & PLAN:    LE edema / erythema  Multifactorial, include: Venous stasis dermatitis / ulceration  Cellulitis  Peripheral arterial disease LE edema d/t CHF / HFpEF exacerbation  Treat underlying cause(s) as noted   Venous stasis with associated stasis ulcers lower extremities bilaterally peripheral arterial disease  S/p angiogram RLE w/ stenting 11/17/24 Vascular surgery following daily dressing changes, ACE wraps to knee Continue keflex  for 7-10d total  WBAT f/u podiatry outpatient 1 week after discharge Vascular surgery plans on taking the patient to the vascular lab on Monday, 11/22/2024 for a right lower extremity angiogram - pt to remain on heparin  infusion until after procedure     Question Cellulitis Continue Keflex  7-10d total    Acute PE Very small.  On apixaban  2.5 bid at baseline, patient unsure if she's been receiving regularly at snf. Given cr under 1.5 assumes stays there would warrant full dose of 5 bid heparin  for now pending vascular angio See pharmacy note 12/11 for eliquis  - test claim for eliquis  5 mg bis $85   HFpEF with acute exacerbation Recent hospitalization with dose reduction in diuretic, likely under-diuresed (discharged on lasix  40, had previously been on bid dosing). Cr up from admission after IV diuresis, but stable lasix  40 mg oral daily - titrate as able /as needed    Hyperkalemia K 5.3 treated with lokelma  improved Monitor BMP   AKI on ckd 3b Cr 1.39 on admission --> peak at 2.09 on 12/08 --> today 11/18/2024 1.56 resuming oral lasix , would consider hold if kidney function worsens again   COPD  not exacerbated.  Arrives w/  prednisone  20 on med list, says has been maintained on that for a couple of months decrease prednisone  to 10 daily, at that dose doesn't need pjp ppx will need close outpt pulm f/u   History tachy-brady A-fib Rate controlled currently heparin  as above Resume eliquis  on discharge as above - See pharmacy note 12/11 for eliquis  -  test claim for eliquis  5 mg bis $85   Elevated troponin, ACS ruled out  Mild, stable, flat, likely demand as no chest pain Recheck troponin / EKG prn chest pain.    Neuropathy home gabapentin    Pulm nodule outpt f/u   Debility Comes from rehab PT consulted patient does not want to return to [facility]. TOC aware, looking elsewhere but if refusing available facility then will have to DC to home instead if unable to arrange alternative placement     Class 2 / borderline Class 3 obesity based on BMI: Body mass index is 38.1 kg/m.[name] Significantly low or high BMI is associated with higher medical risk.  Underweight - under 18  overweight - 25 to 29 obese - 30 or more Class 1 obesity: BMI of 30.0 to 34 Class 2 obesity: BMI of 35.0 to 39 Class 3 obesity: BMI of 40.0 to 49 Super Morbid Obesity: BMI 50-59 Super-super Morbid Obesity: BMI 60+ Healthy nutrition and physical activity advised as adjunct to other disease management and risk reduction treatments    DVT prophylaxis: heparin  IV fluids: no continuous IV fluids  Nutrition: cardiac diet  Central lines / other devices: none  Code Status: DNR ACP documentation reviewed:  none on file in [facility]  [facility] needs: SNF rehab placement Medical barriers to dispo:  Expected medical readiness for discharge Monday after her next angio then .         Subjective / Brief ROS:  Patient reports feeling well today, no complaints  Pain controlled.  Denies new weakness.  Tolerating diet.     Family Communication: call to son Ozell 11/19/2024 3:11 PM discussed updates and all questions answered       Objective Findings:  Vitals:   11/19/24 0522 11/19/24 0802 11/19/24 1156 11/19/24 1455  BP:  123/66 126/69 (!) 126/56  Pulse:  74 65 60  Resp:  18 20 19   Temp:  98.2 F (36.8 C) 98.4 F (36.9 C) 98.4 F (36.9 C)  TempSrc:  Oral Oral Oral  SpO2:  95% 95% 98%  Weight: 88.3 kg     Height:        Intake/Output  Summary (Last 24 hours) at 11/19/2024 1511 Last data filed at 11/19/2024 1421 Gross per 24 hour  Intake 240 ml  Output 1350 ml  Net -1110 ml   Filed Weights   11/17/24 0500 11/18/24 0533 11/19/24 0522  Weight: 94.5 kg 87.8 kg 88.3 kg      Physical Exam Constitutional:      General: She is not in acute distress. Cardiovascular:     Rate and Rhythm: Normal rate and regular rhythm.  Pulmonary:     Effort: Pulmonary effort is normal.     Breath sounds: Normal breath sounds.  Skin:    General: Skin is warm and dry.  Neurological:     Mental Status: She is alert.  Psychiatric:        Mood and Affect: Mood normal.        Behavior: Behavior normal.          Scheduled Medications:   arformoterol   15 mcg Nebulization BID  And   umeclidinium bromide   1 puff Inhalation Daily   cephALEXin   500 mg Oral Q8H   dorzolamide   1 drop Both Eyes BID   furosemide   40 mg Oral Daily   gabapentin   100 mg Oral Daily   guaiFENesin   600 mg Oral BID   hydrocerin   Topical BID   latanoprost   1 drop Both Eyes QHS   pantoprazole   40 mg Oral Daily   polyethylene glycol  17 g Oral Daily   predniSONE   10 mg Oral Q breakfast   rosuvastatin   20 mg Oral Daily    Continuous Infusions:  heparin  750 Units/hr (11/19/24 1425)    PRN Medications:  acetaminophen  **OR** acetaminophen , albuterol , hydrOXYzine , ondansetron  **OR** ondansetron  (ZOFRAN ) IV, mouth rinse, oxyCODONE , senna-docusate  Antimicrobials from admission:  Anti-infectives (From admission, onward)    Start     Dose/Rate Route Frequency Ordered Stop   11/17/24 1112  ceFAZolin  (ANCEF ) IVPB 2g/100 mL premix        2 g 200 mL/hr over 30 Minutes Intravenous 30 min pre-op 11/17/24 1112 11/18/24 1430   11/16/24 1400  cephALEXin  (KEFLEX ) capsule 500 mg        500 mg Oral Every 8 hours 11/16/24 1157     11/16/24 0200  vancomycin  (VANCOREADY) IVPB 1250 mg/250 mL  Status:  Discontinued        1,250 mg 166.7 mL/hr over 90 Minutes  Intravenous Every 48 hours 11/14/24 0522 11/14/24 0901   11/15/24 0130  cefTRIAXone  (ROCEPHIN ) 1 g in sodium chloride  0.9 % 100 mL IVPB  Status:  Discontinued        1 g 200 mL/hr over 30 Minutes Intravenous Every 24 hours 11/14/24 0448 11/14/24 0901   11/14/24 2200  ceFAZolin  (ANCEF ) IVPB 1 g/50 mL premix  Status:  Discontinued        1 g 100 mL/hr over 30 Minutes Intravenous Every 8 hours 11/14/24 0920 11/16/24 1157   11/14/24 1400  ceFAZolin  (ANCEF ) IVPB 1 g/50 mL premix  Status:  Discontinued        1 g 100 mL/hr over 30 Minutes Intravenous Every 8 hours 11/14/24 0919 11/14/24 0920   11/14/24 0500  vancomycin  (VANCOREADY) IVPB 1500 mg/300 mL        1,500 mg 150 mL/hr over 120 Minutes Intravenous  Once 11/14/24 0457 11/14/24 0805   11/14/24 0115  cefTRIAXone  (ROCEPHIN ) 2 g in sodium chloride  0.9 % 100 mL IVPB        2 g 200 mL/hr over 30 Minutes Intravenous Once 11/14/24 0108 11/14/24 0201   11/14/24 0115  vancomycin  (VANCOCIN ) IVPB 1000 mg/200 mL premix        1,000 mg 200 mL/hr over 60 Minutes Intravenous  Once 11/14/24 0108 11/14/24 0319           Data Reviewed:  I have personally reviewed the following...  CBC: Recent Labs  Lab 11/14/24 0022 11/15/24 0509 11/16/24 0649 11/17/24 0452 11/18/24 0421 11/19/24 0406  WBC 10.7* 11.8* 7.7 5.9 6.0 7.4  NEUTROABS 9.4*  --   --   --   --   --   HGB 9.8* 8.7* 8.5* 8.4* 8.7* 8.5*  HCT 32.6* 28.9* 28.3* 28.5* 29.5* 29.0*  MCV 85.8 84.8 84.5 86.6 85.3 86.3  PLT 239 209 166 242 283 317   Basic Metabolic Panel: Recent Labs  Lab 11/15/24 0509 11/15/24 1410 11/16/24 0649 11/18/24 0421 11/19/24 0406  NA 134* 133* 133* 140 139  K 5.3* 5.4* 4.9 4.4  4.1  CL 96* 95* 97* 99 99  CO2 24 22 25  32 30  GLUCOSE 174* 184* 128* 92 121*  BUN 48* 50* 52* 43* 42*  CREATININE 2.00* 2.09* 1.99* 1.56* 1.56*  CALCIUM  8.7* 8.9 8.8* 8.3* 8.8*   GFR: Estimated Creatinine Clearance: 25.2 mL/min (A) (by C-G formula based on SCr of 1.56  mg/dL (H)). Liver Function Tests: Recent Labs  Lab 11/14/24 0022  AST 17  ALT 20  ALKPHOS 129*  BILITOT 0.3  PROT 6.1*  ALBUMIN 3.7   No results for input(s): LIPASE, AMYLASE in the last 168 hours. No results for input(s): AMMONIA in the last 168 hours. Coagulation Profile: Recent Labs  Lab 11/14/24 0354  INR 1.3*   Cardiac Enzymes: No results for input(s): CKTOTAL, CKMB, CKMBINDEX, TROPONINI in the last 168 hours. BNP (last 3 results) Recent Labs    11/14/24 0022  PROBNP 2,277.0*   HbA1C: No results for input(s): HGBA1C in the last 72 hours. CBG: Recent Labs  Lab 11/18/24 1157 11/18/24 1711 11/18/24 2115 11/19/24 0808 11/19/24 1154  GLUCAP 181* 244* 165* 109* 188*   Lipid Profile: No results for input(s): CHOL, HDL, LDLCALC, TRIG, CHOLHDL, LDLDIRECT in the last 72 hours. Thyroid  Function Tests: No results for input(s): TSH, T4TOTAL, FREET4, T3FREE, THYROIDAB in the last 72 hours. Anemia Panel: No results for input(s): VITAMINB12, FOLATE, FERRITIN, TIBC, IRON , RETICCTPCT in the last 72 hours. Most Recent Urinalysis On File:     Component Value Date/Time   COLORURINE YELLOW 10/06/2024 2103   APPEARANCEUR CLEAR 10/06/2024 2103   LABSPEC 1.008 10/06/2024 2103   PHURINE 6.0 10/06/2024 2103   GLUCOSEU NEGATIVE 10/06/2024 2103   HGBUR MODERATE (A) 10/06/2024 2103   BILIRUBINUR NEGATIVE 10/06/2024 2103   KETONESUR NEGATIVE 10/06/2024 2103   PROTEINUR NEGATIVE 10/06/2024 2103   NITRITE NEGATIVE 10/06/2024 2103   LEUKOCYTESUR NEGATIVE 10/06/2024 2103   Sepsis Labs: @LABRCNTIP (procalcitonin:4,lacticidven:4) Microbiology: Recent Results (from the past 240 hours)  Blood culture (routine x 2)     Status: None   Collection Time: 11/14/24  1:22 AM   Specimen: BLOOD  Result Value Ref Range Status   Specimen Description BLOOD RIGHT ANTECUBITAL  Final   Special Requests   Final    BOTTLES DRAWN AEROBIC AND  ANAEROBIC Blood Culture results may not be optimal due to an inadequate volume of blood received in culture bottles   Culture   Final    NO GROWTH 5 DAYS Performed at [facility], [address] [place], [place] [address]    Report Status 11/19/2024 FINAL  Final  Blood culture (routine x 2)     Status: None   Collection Time: 11/14/24  1:22 AM   Specimen: BLOOD  Result Value Ref Range Status   Specimen Description BLOOD BLOOD LEFT FOREARM  Final   Special Requests   Final    BOTTLES DRAWN AEROBIC AND ANAEROBIC Blood Culture adequate volume   Culture   Final    NO GROWTH 5 DAYS Performed at [facility], [address] [place], [place] [address]    Report Status 11/19/2024 FINAL  Final  Resp panel by RT-PCR (RSV, Flu A&B, Covid) Anterior Nasal Swab     Status: None   Collection Time: 11/14/24  1:22 AM   Specimen: Anterior Nasal Swab  Result Value Ref Range Status   SARS Coronavirus 2 by RT PCR NEGATIVE NEGATIVE Final    Comment: (NOTE) SARS-CoV-2 target nucleic acids are NOT DETECTED.  The SARS-CoV-2 RNA  is generally detectable in upper respiratory specimens during the acute phase of infection. The lowest concentration of SARS-CoV-2 viral copies this assay can detect is 138 copies/mL. A negative result does not preclude SARS-Cov-2 infection and should not be used as the sole basis for treatment or other patient management decisions. A negative result may occur with  improper specimen collection/handling, submission of specimen other than nasopharyngeal swab, presence of viral mutation(s) within the areas targeted by this assay, and inadequate number of viral copies(<138 copies/mL). A negative result must be combined with clinical observations, patient history, and epidemiological information. The expected result is Negative.  Fact Sheet for Patients:  bloggercourse.com  Fact Sheet for Healthcare Providers:   seriousbroker.it  This test is no t yet approved or cleared by the [place]  FDA and  has been authorized for detection and/or diagnosis of SARS-CoV-2 by FDA under an Emergency Use Authorization (EUA). This EUA will remain  in effect (meaning this test can be used) for the duration of the COVID-19 declaration under Section 564(b)(1) of the Act, 21 U.S.C.section 360bbb-3(b)(1), unless the authorization is terminated  or revoked sooner.       Influenza A by PCR NEGATIVE NEGATIVE Final   Influenza B by PCR NEGATIVE NEGATIVE Final    Comment: (NOTE) The Xpert Xpress SARS-CoV-2/FLU/RSV plus assay is intended as an aid in the diagnosis of influenza from Nasopharyngeal swab specimens and should not be used as a sole basis for treatment. Nasal washings and aspirates are unacceptable for Xpert Xpress SARS-CoV-2/FLU/RSV testing.  Fact Sheet for Patients: bloggercourse.com  Fact Sheet for Healthcare Providers: seriousbroker.it  This test is not yet approved or cleared by the [place]  FDA and has been authorized for detection and/or diagnosis of SARS-CoV-2 by FDA under an Emergency Use Authorization (EUA). This EUA will remain in effect (meaning this test can be used) for the duration of the COVID-19 declaration under Section 564(b)(1) of the Act, 21 U.S.C. section 360bbb-3(b)(1), unless the authorization is terminated or revoked.     Resp Syncytial Virus by PCR NEGATIVE NEGATIVE Final    Comment: (NOTE) Fact Sheet for Patients: bloggercourse.com  Fact Sheet for Healthcare Providers: seriousbroker.it  This test is not yet approved or cleared by the [place]  FDA and has been authorized for detection and/or diagnosis of SARS-CoV-2 by FDA under an Emergency Use Authorization (EUA). This EUA will remain in effect (meaning this test can be used) for  the duration of the COVID-19 declaration under Section 564(b)(1) of the Act, 21 U.S.C. section 360bbb-3(b)(1), unless the authorization is terminated or revoked.  Performed at [facility], [address], [place], [place] [address]       Radiology Studies last 3 days: PERIPHERAL VASCULAR CATHETERIZATION Result Date: 11/17/2024 See surgical note for result.        Richa Shor, DO [facility] 11/19/2024, 3:11 PM    Dictation software may have been used to generate the above note. Typos may occur and escape review in typed/dictated notes. Please contact Dr Marsa directly for clarity if needed.  Staff may message me via secure chat in Epic  but this may not receive an immediate response,  please page me for urgent matters!  If 7PM-7AM, please contact night coverage [email or web address]

## 2024-11-19 NOTE — TOC Progression Note (Signed)
 Transition of Care [facility]) - Progression Note    Patient Details  Name: ICIS BUDREAU MRN: [id number] Date of Birth: [birth year]  Transition of Care [facility]) CM/SW Contact  Racheal LITTIE Schimke, RN Phone Number: 11/19/2024, 10:04 AM  Clinical Narrative: Attempted to call [facility] [place], [phone] to inquire about SNF bed offer, no answer left message to return call. Also tried calling [facility], out of office until Monday, tried calling [facility], no answer left voice message to return call after review of FL2.    Expected Discharge Plan: Skilled Nursing Facility Barriers to Discharge: Continued Medical Work up               Expected Discharge Plan and Services     Post Acute Care Choice: Skilled Nursing Facility Living arrangements for the past 2 months: Skilled Nursing Facility, Single Family Home                                       Social Drivers of Health (SDOH) Interventions SDOH Screenings   Food Insecurity: No Food Insecurity (11/15/2024)  Housing: Low Risk (11/15/2024)  Transportation Needs: No Transportation Needs (11/15/2024)  Utilities: Not At Risk (11/15/2024)  Alcohol Screen: Low Risk (07/02/2024)  Depression (PHQ2-9): Low Risk (07/22/2022)  Financial Resource Strain: Low Risk (07/02/2024)  Social Connections: Socially Isolated (11/15/2024)  Tobacco Use: Medium Risk (11/15/2024)    Readmission Risk Interventions    07/05/2024   10:24 AM  Readmission Risk Prevention Plan  Transportation Screening Complete  HRI or Home Care Consult Complete  Social Work Consult for Recovery Care Planning/Counseling Complete  Palliative Care Screening Not Applicable  Medication Review Oceanographer) Complete

## 2024-11-19 NOTE — Progress Notes (Signed)
 PHARMACY - ANTICOAGULATION CONSULT NOTE  Pharmacy Consult for IV Heparin   Indication: pulmonary embolus  Patient Measurements: Height: 5' 2 (157.5 cm) Weight: 88.3 kg (194 lb 11.2 oz) IBW/kg (Calculated) : 50.1 HEPARIN  DW (KG): 71.4  Labs: Recent Labs    11/17/24 0452 11/17/24 1052 11/17/24 2223 11/18/24 0421 11/18/24 1148 11/18/24 1922 11/19/24 0406 11/19/24 1231  HGB 8.4*  --   --  8.7*  --   --  8.5*  --   HCT 28.5*  --   --  29.5*  --   --  29.0*  --   PLT 242  --   --  283  --   --  317  --   APTT  --    < > 65* 81* 69* 101* 137*  --   HEPARINUNFRC  --   --  0.73*  --   --   --  0.69 0.82*  CREATININE  --   --   --  1.56*  --   --  1.56*  --    < > = values in this interval not displayed.   Estimated Creatinine Clearance: 25.2 mL/min (A) (by C-G formula based on SCr of 1.56 mg/dL (H)).  Medical History: Past Medical History:  Diagnosis Date   ACUT DUOD ULCER W/HEMORR&PERF W/O MENTION OBST 10/05/2009   NSAID induced   ALLERGIC RHINITIS CAUSE UNSPECIFIED    ANEMIA-NOS    CAD (coronary artery disease) 06/08/2009   DEs RCA with 70% LAD and EF 60%   CHF (congestive heart failure) (HCC)    COPD    mild obst on PFTs 03/2010   Diabetes mellitus 06/2010 dx   Mild, diet controlled   GLAUCOMA    HYPERLIPIDEMIA    HYPERTENSION, BENIGN    MYOCARDIAL INFARCTION 06/08/2009   des to rca   Persistent atrial fibrillation (HCC)    Dx 08/2021   TOBACCO ABUSE    Assessment: Pharmacy consulted to dose heparin  in this 89 year old female admitted with PE.  Pt was on Eliquis  2.5 mg PO BID PTA, last dose on 12/6 @ 2100.  Goal of Therapy:  Heparin  level 0.3-0.7 units/ml aPTT 66 - 102  seconds Monitor platelets by anticoagulation protocol: Yes   12/9 1623 aPTT 121s, supratherapeutic @ 1100 u/hr 12/10 0155 aPTT 117s, supratherapeutic / HL 1.01 12/10 1052 aPTT 69s, therapeutic x 1 12/10 2223 aPTT 65, HL 0.73.  12/11 0421 aPTT 81 12/11 1148 aPTT 69 12/11 1922 aPTT 101, thera  x 1; 950 un/h  12/12 0406 aPTT 137, HL 0.69 12/12 1231 HL 0.82.    Plan:  Heparin  level is supratherapeutic. I believe the DOAC has washed out, as the level trended down and now bad up. Will use heparin  level for monitoring. Will decrease heparin  infusion to 750 units/hr. Recheck heparin  level in 8 hours. CBC daily while on heparin .    Jenny [name], PharmD Clinical Pharmacist 11/19/2024 2:20 PM

## 2024-11-19 NOTE — Progress Notes (Addendum)
 4:02 pm: Spoke with patient via phone, who is at the bedside inquiring about SNF bedoffer. Son consents to [facility] bed offer and understands that patient may discharge Tuesday.  3:22 pm. Spoke with patient about bed offers below. She chose [facility]. CMRN called [facility] and secured bed offer with Admission Taya.    List of Short-Term Rehab. Facilities with bed offers for patient. Will discuss with patient for preference.  [facility]  Accepted -- [address], [place] [address] [phone]    [facility]erred SN[facility]  Accepted -- [address] [place] [address] [phone]    [facility]  Accepted -- [address] [place] [address] [phone]    [facility]  Accepted -- [address] [place] [address] [phone]    [facility] SNF  Accepted -- [address], [place] [address] [phone]

## 2024-11-19 NOTE — Progress Notes (Signed)
 PT Cancellation Note  Patient Details Name: Jenny [name] MRN: [id number] DOB: 1933/06/05   Cancelled Treatment:    Reason Eval/Treat Not Completed: Other (comment) Attempted to see pt for PT tx. Pt reports she's already been up & declines getting up again; pt also declines bed level exercises. Will f/u as able.  Jenny [name], PT, DPT 11/19/2024, 1:49 PM   Jenny [name] 11/19/2024, 1:48 PM

## 2024-11-20 ENCOUNTER — Inpatient Hospital Stay

## 2024-11-20 DIAGNOSIS — I4891 Unspecified atrial fibrillation: Secondary | ICD-10-CM

## 2024-11-20 LAB — GLUCOSE, CAPILLARY
Glucose-Capillary: 102 mg/dL — ABNORMAL HIGH (ref 70–99)
Glucose-Capillary: 137 mg/dL — ABNORMAL HIGH (ref 70–99)
Glucose-Capillary: 213 mg/dL — ABNORMAL HIGH (ref 70–99)
Glucose-Capillary: 208 mg/dL — ABNORMAL HIGH (ref 70–99)

## 2024-11-20 LAB — CBC
HCT: 29.3 % — ABNORMAL LOW (ref 36.0–46.0)
Hemoglobin: 8.5 g/dL — ABNORMAL LOW (ref 12.0–15.0)
MCH: 25.1 pg — ABNORMAL LOW (ref 26.0–34.0)
MCHC: 29 g/dL — ABNORMAL LOW (ref 30.0–36.0)
MCV: 86.7 fL (ref 80.0–100.0)
Platelets: 321 K/uL (ref 150–400)
RBC: 3.38 MIL/uL — ABNORMAL LOW (ref 3.87–5.11)
RDW: 19.1 % — ABNORMAL HIGH (ref 11.5–15.5)
WBC: 6.5 K/uL (ref 4.0–10.5)
nRBC: 2.8 % — ABNORMAL HIGH (ref 0.0–0.2)

## 2024-11-20 LAB — HEPARIN LEVEL (UNFRACTIONATED): Heparin Unfractionated: 0.47 [IU]/mL (ref 0.30–0.70)

## 2024-11-20 MED ORDER — FUROSEMIDE 10 MG/ML IJ SOLN
20.0000 mg | Freq: Once | INTRAMUSCULAR | Status: AC
  Administered 2024-11-20: 20 mg via INTRAVENOUS
  Filled 2024-11-20: qty 2

## 2024-11-20 MED ORDER — IPRATROPIUM-ALBUTEROL 0.5-2.5 (3) MG/3ML IN SOLN
3.0000 mL | Freq: Three times a day (TID) | RESPIRATORY_TRACT | Status: DC
  Administered 2024-11-21 – 2024-11-24 (×10): 3 mL via RESPIRATORY_TRACT
  Filled 2024-11-20 (×11): qty 3

## 2024-11-20 MED ORDER — BUDESONIDE 0.25 MG/2ML IN SUSP
0.2500 mg | Freq: Two times a day (BID) | RESPIRATORY_TRACT | Status: DC
  Administered 2024-11-20 – 2024-12-03 (×25): 0.25 mg via RESPIRATORY_TRACT
  Filled 2024-11-20 (×27): qty 2

## 2024-11-20 MED ORDER — IPRATROPIUM-ALBUTEROL 0.5-2.5 (3) MG/3ML IN SOLN
3.0000 mL | Freq: Four times a day (QID) | RESPIRATORY_TRACT | Status: DC
  Administered 2024-11-20: 3 mL via RESPIRATORY_TRACT
  Filled 2024-11-20: qty 3

## 2024-11-20 NOTE — Progress Notes (Signed)
 PHARMACY - ANTICOAGULATION CONSULT NOTE  Pharmacy Consult for IV Heparin   Indication: pulmonary embolus  Patient Measurements: Height: 5' 2 (157.5 cm) Weight: 89.5 kg (197 lb 5 oz) IBW/kg (Calculated) : 50.1 HEPARIN  DW (KG): 71.4  Labs: Recent Labs    11/18/24 0421 11/18/24 1148 11/18/24 1922 11/19/24 0406 11/19/24 1231 11/19/24 2257 11/20/24 0544  HGB 8.7*  --   --  8.5*  --   --  8.5*  HCT 29.5*  --   --  29.0*  --   --  29.3*  PLT 283  --   --  317  --   --  321  APTT 81* 69* 101* 137*  --   --   --   HEPARINUNFRC  --   --   --  0.69 0.82* 0.47 0.47  CREATININE 1.56*  --   --  1.56*  --   --   --    Estimated Creatinine Clearance: 25.4 mL/min (A) (by C-G formula based on SCr of 1.56 mg/dL (H)).  Medical History: Past Medical History:  Diagnosis Date   ACUT DUOD ULCER W/HEMORR&PERF W/O MENTION OBST 10/05/2009   NSAID induced   ALLERGIC RHINITIS CAUSE UNSPECIFIED    ANEMIA-NOS    CAD (coronary artery disease) 06/08/2009   DEs RCA with 70% LAD and EF 60%   CHF (congestive heart failure) (HCC)    COPD    mild obst on PFTs 03/2010   Diabetes mellitus 06/2010 dx   Mild, diet controlled   GLAUCOMA    HYPERLIPIDEMIA    HYPERTENSION, BENIGN    MYOCARDIAL INFARCTION 06/08/2009   des to rca   Persistent atrial fibrillation (HCC)    Dx 08/2021   TOBACCO ABUSE    Assessment: Pharmacy consulted to dose heparin  in this 89 year old female admitted with PE.  Pt was on Eliquis  2.5 mg PO BID PTA, last dose on 12/6 @ 2100.  Goal of Therapy:  Heparin  level 0.3-0.7 units/ml aPTT 66 - 102  seconds Monitor platelets by anticoagulation protocol: Yes   12/9 1623 aPTT 121s, supratherapeutic @ 1100 u/hr 12/10 0155 aPTT 117s, supratherapeutic / HL 1.01 12/10 1052 aPTT 69s, therapeutic x 1 12/10 2223 aPTT 65, HL 0.73.  12/11 0421 aPTT 81 12/11 1148 aPTT 69 12/11 1922 aPTT 101, thera x 1; 950 un/h  12/12 0406 aPTT 137, HL 0.69 12/12 1231 HL 0.82 12/12 2257 HL 0.47, therapeutic  x 1 12/13 0544 HL 0.47, therapeutic x 2   Plan:  Will continue heparin  infusion at 750 units/hr. Recheck heparin  level daily w/ AM labs while therapeutic. CBC daily while on heparin .   Rankin Jenny [name], PharmD, [facility] 11/20/2024 6:24 AM

## 2024-11-20 NOTE — Progress Notes (Addendum)
 PROGRESS NOTE   Jenny [name]   FMW:[id number] DOB: 07-Jul-1933  DOA: 11/14/2024 Date of Service: 11/20/2024 which is hospital day 6  PCP: [facility]    Hospital course / significant events:   HPI: Jenny [name] is a 89 y.o. female with medical history significant for HFpEF, hypertension, persistent A-fib on Eliquis , tachybradycardia syndrome, CAD, COPD, CKD stage 3b, type 2 diabetes, anemia. Presents to ED from [facility]), 3 days LE edema and R leg pain 1 day, also SOB  12/07: to ED early hours AM. CTA chest showed tiny segmental PE left lower lobe without heart strain, extensive multivessel CAD and mild asymmetric pulmonary edema around the left hilum. Initially was treated with DuoNebs and Solu-Medrol . She initially received LR boluses but subsequently given a dose of Lasix  as test results came in. Started on vancomycin  and Rocephin  and also on a heparin  infusion for PE. Admitted with question sepsis d/t lower extremity cellulitis, vs non-infectious SIRS multifactorial including acute dyspnea secondary to COPD and CHF exacerbation with a segmental PE seen on CTA chest. Podiatry consulted re: venous stasis ulcers on feet - bedisde debridement, appear superficial and recs for daily dressing changes, ACE wraps to knee, po abx on dc, WBAT, f/u outpatient 1 week after discharge   12/08: Vascular surgery consult d/t decreased ABI / PE> No thrombectomy given small PE. RLE angio planned for 12/10 12/09: stable 12/10: RLE angio and stents today. Cr 1.99 12/11: renal fxn improving Cr 1.56. Vascular surgery plans on taking the patient to the vascular lab on Monday, 11/22/2024 for a right lower extremity angiogram - pt to remain on heparin  infusion until after procedure  12/12: doing well / stable.  12/13: VERY short of breath w/ minimal exertion but improves quickly w/ rest. Changed nebs, will give 1 dose IV lasix  today      Consultants:   Podiatry Vascular Surgery  Palliative Care   Procedures/Surgeries: RLE angiogram and stents 11/17/24       ASSESSMENT & PLAN:   LE edema / erythema  Multifactorial, include: Venous stasis dermatitis / ulceration  Cellulitis  Peripheral arterial disease LE edema d/t CHF / HFpEF exacerbation  Treat underlying cause(s) as noted   Venous stasis  Venous stasis ulcers lower extremities bilaterally Question Cellulitis daily dressing changes, ACE wraps to knee Continue keflex  for 7-10d total  WBAT f/u podiatry outpatient 1 week after discharge  Peripheral arterial disease  S/p angiogram RLE w/ stenting 11/17/24 Vascular surgery following Vascular surgery plans on taking the patient to the vascular lab on Monday, 11/22/2024 for a right lower extremity angiogram - pt to remain on heparin  infusion until after procedure      Acute PE Very small.  On apixaban  2.5 bid at baseline, patient unsure if she's been receiving regularly at snf. Given cr under 1.5 assumes stays there would warrant full dose of 5 bid heparin  for now pending vascular angio See pharmacy note 12/11 for eliquis  - test claim for eliquis  5 mg bis $85   HFpEF with acute exacerbation Recent hospitalization with dose reduction in diuretic, likely under-diuresed (discharged on lasix  40, had previously been on bid dosing). Cr up from admission after IV diuresis, but stable lasix  40 mg oral daily - titrate as able /as needed    Hyperkalemia - resolved  K 5.3 treated with lokelma  improved Monitor BMP   AKI on ckd 3b Cr 1.39 on admission --> peak at 2.09 on 12/08 --> 11/18/2024 1.56  resuming oral lasix , would consider hold if kidney function worsens again   COPD  not exacerbated.  Arrives w/ prednisone  20 on med list, says has been maintained on that for a couple of months decrease prednisone  to 10 daily, at that dose doesn't need pjp ppx will need close outpt pulm f/u O2 supplementation Adding schedule nebs  given SOB today - budesonide  bid, duoneb q6h while awake Albuterol  prn    History tachy-brady A-fib Rate controlled currently heparin  as above Resume eliquis  on discharge as above - See pharmacy note 12/11 for eliquis  - test claim for eliquis  5 mg bis $85   Elevated troponin, ACS ruled out  Mild, stable, flat, likely demand as no chest pain Recheck troponin / EKG prn chest pain.    Neuropathy home gabapentin    Pulm nodule outpt f/u   Debility Comes from rehab PT consulted patient does not want to return to [facility]. TOC aware, looking elsewhere but if refusing available facility then will have to DC to home instead if unable to arrange alternative placement     Class 2 / borderline Class 3 obesity based on BMI: Body mass index is 38.1 kg/m.[name] Significantly low or high BMI is associated with higher medical risk.  Underweight - under 18  overweight - 25 to 29 obese - 30 or more Class 1 obesity: BMI of 30.0 to 34 Class 2 obesity: BMI of 35.0 to 39 Class 3 obesity: BMI of 40.0 to 49 Super Morbid Obesity: BMI 50-59 Super-super Morbid Obesity: BMI 60+ Healthy nutrition and physical activity advised as adjunct to other disease management and risk reduction treatments    DVT prophylaxis: heparin  IV fluids: no continuous IV fluids  Nutrition: cardiac diet  Central lines / other devices: none  Code Status: DNR ACP documentation reviewed:  none on file in [facility]  [facility] needs: SNF rehab placement Medical barriers to dispo:  Expected medical readiness for discharge Monday after her next angio then .         Subjective / Brief ROS:  SOB w/ minimal exertion - pt states any time she moves or smoeone moves her she feels winded and breathin fast, chest pressure. Episode this morning, RN alerted me. See below for exam at bedside. Pt reports SOB resolves with few minutes rest  Pain controlled.  Denies new weakness.  Tolerating diet.     Family Communication: call  to son Jenny [name] yesterday, discussed updates and all questions answered       Objective Findings:  EKG this morning w/ increased WOB - artifact limits read but appears rate controlled afib and minimally irregular rhythm   CXR this morning w/ increased WOB - no new concerns, possibly a bit more vascular congestion   Vitals:   11/20/24 0500 11/20/24 0725 11/20/24 0850 11/20/24 1209  BP:   (!) 152/65   Pulse:   65   Resp:   20   Temp:   98.8 F (37.1 C)   TempSrc:   Oral   SpO2:  95% 96% 99%  Weight: 89.5 kg     Height:        Intake/Output Summary (Last 24 hours) at 11/20/2024 1253 Last data filed at 11/20/2024 0900 Gross per 24 hour  Intake 120 ml  Output 1775 ml  Net -1655 ml   Filed Weights   11/18/24 0533 11/19/24 0522 11/20/24 0500  Weight: 87.8 kg 88.3 kg 89.5 kg      Physical Exam Constitutional:  General: She is not in acute distress. Cardiovascular:     Rate and Rhythm: Normal rate and regular rhythm.  Pulmonary:     Effort: Pulmonary effort is normal.     Breath sounds: Normal breath sounds.  Skin:    General: Skin is warm and dry.  Neurological:     Mental Status: She is alert.  Psychiatric:        Mood and Affect: Mood normal.        Behavior: Behavior normal.          Scheduled Medications:   budesonide  (PULMICORT ) nebulizer solution  0.25 mg Nebulization BID   cephALEXin   500 mg Oral Q8H   dorzolamide   1 drop Both Eyes BID   furosemide   40 mg Oral Daily   gabapentin   100 mg Oral Daily   guaiFENesin   600 mg Oral BID   hydrocerin   Topical BID   ipratropium-albuterol   3 mL Nebulization Q6H WA   latanoprost   1 drop Both Eyes QHS   pantoprazole   40 mg Oral Daily   polyethylene glycol  17 g Oral Daily   predniSONE   10 mg Oral Q breakfast   rosuvastatin   20 mg Oral Daily    Continuous Infusions:  heparin  750 Units/hr (11/19/24 1425)    PRN Medications:  acetaminophen  **OR** acetaminophen , albuterol , hydrOXYzine , ondansetron   **OR** ondansetron  (ZOFRAN ) IV, mouth rinse, oxyCODONE , senna-docusate  Antimicrobials from admission:  Anti-infectives (From admission, onward)    Start     Dose/Rate Route Frequency Ordered Stop   11/17/24 1112  ceFAZolin  (ANCEF ) IVPB 2g/100 mL premix        2 g 200 mL/hr over 30 Minutes Intravenous 30 min pre-op 11/17/24 1112 11/18/24 1430   11/16/24 1400  cephALEXin  (KEFLEX ) capsule 500 mg        500 mg Oral Every 8 hours 11/16/24 1157     11/16/24 0200  vancomycin  (VANCOREADY) IVPB 1250 mg/250 mL  Status:  Discontinued        1,250 mg 166.7 mL/hr over 90 Minutes Intravenous Every 48 hours 11/14/24 0522 11/14/24 0901   11/15/24 0130  cefTRIAXone  (ROCEPHIN ) 1 g in sodium chloride  0.9 % 100 mL IVPB  Status:  Discontinued        1 g 200 mL/hr over 30 Minutes Intravenous Every 24 hours 11/14/24 0448 11/14/24 0901   11/14/24 2200  ceFAZolin  (ANCEF ) IVPB 1 g/50 mL premix  Status:  Discontinued        1 g 100 mL/hr over 30 Minutes Intravenous Every 8 hours 11/14/24 0920 11/16/24 1157   11/14/24 1400  ceFAZolin  (ANCEF ) IVPB 1 g/50 mL premix  Status:  Discontinued        1 g 100 mL/hr over 30 Minutes Intravenous Every 8 hours 11/14/24 0919 11/14/24 0920   11/14/24 0500  vancomycin  (VANCOREADY) IVPB 1500 mg/300 mL        1,500 mg 150 mL/hr over 120 Minutes Intravenous  Once 11/14/24 0457 11/14/24 0805   11/14/24 0115  cefTRIAXone  (ROCEPHIN ) 2 g in sodium chloride  0.9 % 100 mL IVPB        2 g 200 mL/hr over 30 Minutes Intravenous Once 11/14/24 0108 11/14/24 0201   11/14/24 0115  vancomycin  (VANCOCIN ) IVPB 1000 mg/200 mL premix        1,000 mg 200 mL/hr over 60 Minutes Intravenous  Once 11/14/24 0108 11/14/24 0319           Data Reviewed:  I have personally reviewed the following.SABRASABRA  CBC: Recent Labs  Lab 11/14/24 0022 11/15/24 0509 11/16/24 0649 11/17/24 0452 11/18/24 0421 11/19/24 0406 11/20/24 0544  WBC 10.7*   < > 7.7 5.9 6.0 7.4 6.5  NEUTROABS 9.4*  --   --   --    --   --   --   HGB 9.8*   < > 8.5* 8.4* 8.7* 8.5* 8.5*  HCT 32.6*   < > 28.3* 28.5* 29.5* 29.0* 29.3*  MCV 85.8   < > 84.5 86.6 85.3 86.3 86.7  PLT 239   < > 166 242 283 317 321   < > = values in this interval not displayed.   Basic Metabolic Panel: Recent Labs  Lab 11/15/24 0509 11/15/24 1410 11/16/24 0649 11/18/24 0421 11/19/24 0406  NA 134* 133* 133* 140 139  K 5.3* 5.4* 4.9 4.4 4.1  CL 96* 95* 97* 99 99  CO2 24 22 25  32 30  GLUCOSE 174* 184* 128* 92 121*  BUN 48* 50* 52* 43* 42*  CREATININE 2.00* 2.09* 1.99* 1.56* 1.56*  CALCIUM  8.7* 8.9 8.8* 8.3* 8.8*   GFR: Estimated Creatinine Clearance: 25.4 mL/min (A) (by C-G formula based on SCr of 1.56 mg/dL (H)). Liver Function Tests: Recent Labs  Lab 11/14/24 0022  AST 17  ALT 20  ALKPHOS 129*  BILITOT 0.3  PROT 6.1*  ALBUMIN 3.7   No results for input(s): LIPASE, AMYLASE in the last 168 hours. No results for input(s): AMMONIA in the last 168 hours. Coagulation Profile: Recent Labs  Lab 11/14/24 0354  INR 1.3*   Cardiac Enzymes: No results for input(s): CKTOTAL, CKMB, CKMBINDEX, TROPONINI in the last 168 hours. BNP (last 3 results) Recent Labs    11/14/24 0022  PROBNP 2,277.0*   HbA1C: No results for input(s): HGBA1C in the last 72 hours. CBG: Recent Labs  Lab 11/19/24 0808 11/19/24 1154 11/19/24 2220 11/20/24 0842 11/20/24 1131  GLUCAP 109* 188* 134* 102* 137*   Lipid Profile: No results for input(s): CHOL, HDL, LDLCALC, TRIG, CHOLHDL, LDLDIRECT in the last 72 hours. Thyroid  Function Tests: No results for input(s): TSH, T4TOTAL, FREET4, T3FREE, THYROIDAB in the last 72 hours. Anemia Panel: No results for input(s): VITAMINB12, FOLATE, FERRITIN, TIBC, IRON , RETICCTPCT in the last 72 hours. Most Recent Urinalysis On File:     Component Value Date/Time   COLORURINE YELLOW 10/06/2024 2103   APPEARANCEUR CLEAR 10/06/2024 2103   LABSPEC 1.008  10/06/2024 2103   PHURINE 6.0 10/06/2024 2103   GLUCOSEU NEGATIVE 10/06/2024 2103   HGBUR MODERATE (A) 10/06/2024 2103   BILIRUBINUR NEGATIVE 10/06/2024 2103   KETONESUR NEGATIVE 10/06/2024 2103   PROTEINUR NEGATIVE 10/06/2024 2103   NITRITE NEGATIVE 10/06/2024 2103   LEUKOCYTESUR NEGATIVE 10/06/2024 2103   Sepsis Labs: @LABRCNTIP (procalcitonin:4,lacticidven:4) Microbiology: Recent Results (from the past 240 hours)  Blood culture (routine x 2)     Status: None   Collection Time: 11/14/24  1:22 AM   Specimen: BLOOD  Result Value Ref Range Status   Specimen Description BLOOD RIGHT ANTECUBITAL  Final   Special Requests   Final    BOTTLES DRAWN AEROBIC AND ANAEROBIC Blood Culture results may not be optimal due to an inadequate volume of blood received in culture bottles   Culture   Final    NO GROWTH 5 DAYS Performed at [facility], [address], [place], [place] [address]    Report Status 11/19/2024 FINAL  Final  Blood culture (routine x 2)     Status: None   Collection  Time: 11/14/24  1:22 AM   Specimen: BLOOD  Result Value Ref Range Status   Specimen Description BLOOD BLOOD LEFT FOREARM  Final   Special Requests   Final    BOTTLES DRAWN AEROBIC AND ANAEROBIC Blood Culture adequate volume   Culture   Final    NO GROWTH 5 DAYS Performed at [facility], [address] [place], [place] [address]    Report Status 11/19/2024 FINAL  Final  Resp panel by RT-PCR (RSV, Flu A&B, Covid) Anterior Nasal Swab     Status: None   Collection Time: 11/14/24  1:22 AM   Specimen: Anterior Nasal Swab  Result Value Ref Range Status   SARS Coronavirus 2 by RT PCR NEGATIVE NEGATIVE Final    Comment: (NOTE) SARS-CoV-2 target nucleic acids are NOT DETECTED.  The SARS-CoV-2 RNA is generally detectable in upper respiratory specimens during the acute phase of infection. The lowest concentration of SARS-CoV-2 viral copies this assay can detect is 138 copies/mL. A negative  result does not preclude SARS-Cov-2 infection and should not be used as the sole basis for treatment or other patient management decisions. A negative result may occur with  improper specimen collection/handling, submission of specimen other than nasopharyngeal swab, presence of viral mutation(s) within the areas targeted by this assay, and inadequate number of viral copies(<138 copies/mL). A negative result must be combined with clinical observations, patient history, and epidemiological information. The expected result is Negative.  Fact Sheet for Patients:  bloggercourse.com  Fact Sheet for Healthcare Providers:  seriousbroker.it  This test is no t yet approved or cleared by the [place]  FDA and  has been authorized for detection and/or diagnosis of SARS-CoV-2 by FDA under an Emergency Use Authorization (EUA). This EUA will remain  in effect (meaning this test can be used) for the duration of the COVID-19 declaration under Section 564(b)(1) of the Act, 21 U.S.C.section 360bbb-3(b)(1), unless the authorization is terminated  or revoked sooner.       Influenza A by PCR NEGATIVE NEGATIVE Final   Influenza B by PCR NEGATIVE NEGATIVE Final    Comment: (NOTE) The Xpert Xpress SARS-CoV-2/FLU/RSV plus assay is intended as an aid in the diagnosis of influenza from Nasopharyngeal swab specimens and should not be used as a sole basis for treatment. Nasal washings and aspirates are unacceptable for Xpert Xpress SARS-CoV-2/FLU/RSV testing.  Fact Sheet for Patients: bloggercourse.com  Fact Sheet for Healthcare Providers: seriousbroker.it  This test is not yet approved or cleared by the [place]  FDA and has been authorized for detection and/or diagnosis of SARS-CoV-2 by FDA under an Emergency Use Authorization (EUA). This EUA will remain in effect (meaning this test can be used)  for the duration of the COVID-19 declaration under Section 564(b)(1) of the Act, 21 U.S.C. section 360bbb-3(b)(1), unless the authorization is terminated or revoked.     Resp Syncytial Virus by PCR NEGATIVE NEGATIVE Final    Comment: (NOTE) Fact Sheet for Patients: bloggercourse.com  Fact Sheet for Healthcare Providers: seriousbroker.it  This test is not yet approved or cleared by the [place]  FDA and has been authorized for detection and/or diagnosis of SARS-CoV-2 by FDA under an Emergency Use Authorization (EUA). This EUA will remain in effect (meaning this test can be used) for the duration of the COVID-19 declaration under Section 564(b)(1) of the Act, 21 U.S.C. section 360bbb-3(b)(1), unless the authorization is terminated or revoked.  Performed at [facility], [address] [place], [place] [address]  Radiology Studies last 3 days: DG Chest Port 1 View Result Date: 11/20/2024 CLINICAL DATA:  [id number] SOB (shortness of breath) [id number] EXAM: PORTABLE CHEST 1 VIEW COMPARISON:  November 14, 2024, July 01, 2024 FINDINGS: The cardiomediastinal silhouette is unchanged in contour.Atherosclerotic calcifications. RIGHT upper lobe pulmonary nodule, better assessed on recent CT. No pleural effusion. No pneumothorax. No acute pleuroparenchymal abnormality. IMPRESSION: 1. No acute cardiopulmonary abnormality. 2. RIGHT upper lobe pulmonary nodule, better assessed on recent CT. Recommend management as outlined on December 7th CT. Electronically Signed   By: Corean Salter M.D.   On: 11/20/2024 11:25   PERIPHERAL VASCULAR CATHETERIZATION Result Date: 11/17/2024 See surgical note for result.     CRITICAL CARE Performed by: Laneta Blunt   Total critical care time: 35 minutes  Critical care time was exclusive of separately billable procedures and treating other patients.  Critical care was necessary to  treat or prevent imminent or life-threatening deterioration.  Critical care was time spent personally by me on the following activities: development of treatment plan with patient and/or surrogate as well as nursing, discussions with consultants, evaluation of patient's response to treatment, examination of patient, obtaining history from patient or surrogate, ordering and performing treatments and interventions, ordering and review of laboratory studies, ordering and review of radiographic studies, pulse oximetry and re-evaluation of patient's condition.    Tresean Mattix, DO [facility] 11/20/2024, 12:53 PM    Dictation software may have been used to generate the above note. Typos may occur and escape review in typed/dictated notes. Please contact Dr Blunt directly for clarity if needed.  Staff may message me via secure chat in Epic  but this may not receive an immediate response,  please page me for urgent matters!  If 7PM-7AM, please contact night coverage [email or web address]

## 2024-11-21 DIAGNOSIS — E66813 Obesity, class 3: Secondary | ICD-10-CM | POA: Diagnosis not present

## 2024-11-21 DIAGNOSIS — N1832 Chronic kidney disease, stage 3b: Secondary | ICD-10-CM | POA: Diagnosis not present

## 2024-11-21 DIAGNOSIS — Z66 Do not resuscitate: Secondary | ICD-10-CM | POA: Diagnosis not present

## 2024-11-21 DIAGNOSIS — I5033 Acute on chronic diastolic (congestive) heart failure: Secondary | ICD-10-CM | POA: Diagnosis not present

## 2024-11-21 DIAGNOSIS — L03115 Cellulitis of right lower limb: Secondary | ICD-10-CM | POA: Diagnosis not present

## 2024-11-21 DIAGNOSIS — I495 Sick sinus syndrome: Secondary | ICD-10-CM | POA: Diagnosis not present

## 2024-11-21 DIAGNOSIS — J441 Chronic obstructive pulmonary disease with (acute) exacerbation: Secondary | ICD-10-CM | POA: Diagnosis not present

## 2024-11-21 DIAGNOSIS — J9601 Acute respiratory failure with hypoxia: Secondary | ICD-10-CM | POA: Diagnosis not present

## 2024-11-21 DIAGNOSIS — Z789 Other specified health status: Secondary | ICD-10-CM | POA: Diagnosis not present

## 2024-11-21 DIAGNOSIS — L03116 Cellulitis of left lower limb: Secondary | ICD-10-CM | POA: Diagnosis not present

## 2024-11-21 DIAGNOSIS — Z7189 Other specified counseling: Secondary | ICD-10-CM | POA: Diagnosis not present

## 2024-11-21 DIAGNOSIS — Z515 Encounter for palliative care: Secondary | ICD-10-CM | POA: Diagnosis not present

## 2024-11-21 LAB — RESP PANEL BY RT-PCR (RSV, FLU A&B, COVID)  RVPGX2
Influenza A by PCR: POSITIVE — AB
Influenza B by PCR: NEGATIVE
Resp Syncytial Virus by PCR: NEGATIVE
SARS Coronavirus 2 by RT PCR: NEGATIVE

## 2024-11-21 LAB — CBC
HCT: 31.4 % — ABNORMAL LOW (ref 36.0–46.0)
Hemoglobin: 9.1 g/dL — ABNORMAL LOW (ref 12.0–15.0)
MCH: 25.1 pg — ABNORMAL LOW (ref 26.0–34.0)
MCHC: 29 g/dL — ABNORMAL LOW (ref 30.0–36.0)
MCV: 86.5 fL (ref 80.0–100.0)
Platelets: 353 K/uL (ref 150–400)
RBC: 3.63 MIL/uL — ABNORMAL LOW (ref 3.87–5.11)
RDW: 19 % — ABNORMAL HIGH (ref 11.5–15.5)
WBC: 7.3 K/uL (ref 4.0–10.5)
nRBC: 2.3 % — ABNORMAL HIGH (ref 0.0–0.2)

## 2024-11-21 LAB — BASIC METABOLIC PANEL WITH GFR
Anion gap: 11 (ref 5–15)
BUN: 35 mg/dL — ABNORMAL HIGH (ref 8–23)
CO2: 35 mmol/L — ABNORMAL HIGH (ref 22–32)
Calcium: 8.9 mg/dL (ref 8.9–10.3)
Chloride: 97 mmol/L — ABNORMAL LOW (ref 98–111)
Creatinine, Ser: 1.29 mg/dL — ABNORMAL HIGH (ref 0.44–1.00)
GFR, Estimated: 39 mL/min — ABNORMAL LOW (ref >60)
Glucose, Bld: 133 mg/dL — ABNORMAL HIGH (ref 70–99)
Potassium: 4 mmol/L (ref 3.5–5.1)
Sodium: 142 mmol/L (ref 135–145)

## 2024-11-21 LAB — RESPIRATORY PANEL BY PCR

## 2024-11-21 LAB — BLOOD GAS, VENOUS
Acid-Base Excess: 11.8 mmol/L — ABNORMAL HIGH (ref 0.0–2.0)
Bicarbonate: 37.2 mmol/L — ABNORMAL HIGH (ref 20.0–28.0)
O2 Saturation: 99.3 %
Patient temperature: 37
pCO2, Ven: 50 mmHg (ref 44–60)
pH, Ven: 7.48 — ABNORMAL HIGH (ref 7.25–7.43)
pO2, Ven: 83 mmHg — ABNORMAL HIGH (ref 32–45)

## 2024-11-21 LAB — GLUCOSE, CAPILLARY
Glucose-Capillary: 124 mg/dL — ABNORMAL HIGH (ref 70–99)
Glucose-Capillary: 170 mg/dL — ABNORMAL HIGH (ref 70–99)
Glucose-Capillary: 177 mg/dL — ABNORMAL HIGH (ref 70–99)
Glucose-Capillary: 173 mg/dL — ABNORMAL HIGH (ref 70–99)

## 2024-11-21 LAB — HEPARIN LEVEL (UNFRACTIONATED): Heparin Unfractionated: 0.49 [IU]/mL (ref 0.30–0.70)

## 2024-11-21 MED ORDER — SENNOSIDES-DOCUSATE SODIUM 8.6-50 MG PO TABS
2.0000 | ORAL_TABLET | Freq: Once | ORAL | Status: AC
  Administered 2024-11-21: 2 via ORAL
  Filled 2024-11-21: qty 2

## 2024-11-21 MED ORDER — OSELTAMIVIR PHOSPHATE 30 MG PO CAPS
30.0000 mg | ORAL_CAPSULE | Freq: Two times a day (BID) | ORAL | Status: DC
  Administered 2024-11-21 – 2024-11-23 (×5): 30 mg via ORAL
  Filled 2024-11-21 (×6): qty 1

## 2024-11-21 MED ORDER — FLEET ENEMA RE ENEM
1.0000 | ENEMA | Freq: Every day | RECTAL | Status: DC | PRN
  Administered 2024-11-21: 1 via RECTAL

## 2024-11-21 MED ORDER — FUROSEMIDE 40 MG PO TABS
40.0000 mg | ORAL_TABLET | Freq: Two times a day (BID) | ORAL | Status: DC
  Administered 2024-11-21 – 2024-11-24 (×7): 40 mg via ORAL
  Filled 2024-11-21 (×7): qty 1

## 2024-11-21 MED ORDER — OSELTAMIVIR PHOSPHATE 75 MG PO CAPS
75.0000 mg | ORAL_CAPSULE | Freq: Once | ORAL | Status: AC
  Administered 2024-11-21: 75 mg via ORAL
  Filled 2024-11-21: qty 1

## 2024-11-21 NOTE — Plan of Care (Signed)
   Problem: Respiratory: Goal: Ability to maintain a clear airway will improve Outcome: Progressing Goal: Levels of oxygenation will improve Outcome: Progressing Goal: Ability to maintain adequate ventilation will improve Outcome: Progressing

## 2024-11-21 NOTE — Progress Notes (Signed)
 PHARMACY - ANTICOAGULATION CONSULT NOTE  Pharmacy Consult for IV Heparin   Indication: pulmonary embolus  Patient Measurements: Height: 5' 2 (157.5 cm) Weight: 89.5 kg (197 lb 5 oz) IBW/kg (Calculated) : 50.1 HEPARIN  DW (KG): 71.4  Labs: Recent Labs     0000 11/18/24 1148 11/18/24 1922 11/19/24 0406 11/19/24 1231 11/19/24 2257 11/20/24 0544 11/21/24 0617  HGB   < >  --   --  8.5*  --   --  8.5* 9.1*  HCT  --   --   --  29.0*  --   --  29.3* 31.4*  PLT  --   --   --  317  --   --  321 353  APTT  --  69* 101* 137*  --   --   --   --   HEPARINUNFRC  --   --   --  0.69   < > 0.47 0.47 0.49  CREATININE  --   --   --  1.56*  --   --   --  1.29*   < > = values in this interval not displayed.   Estimated Creatinine Clearance: 30.8 mL/min (A) (by C-G formula based on SCr of 1.29 mg/dL (H)).  Medical History: Past Medical History:  Diagnosis Date   ACUT DUOD ULCER W/HEMORR&PERF W/O MENTION OBST 10/05/2009   NSAID induced   ALLERGIC RHINITIS CAUSE UNSPECIFIED    ANEMIA-NOS    CAD (coronary artery disease) 06/08/2009   DEs RCA with 70% LAD and EF 60%   CHF (congestive heart failure) (HCC)    COPD    mild obst on PFTs 03/2010   Diabetes mellitus 06/2010 dx   Mild, diet controlled   GLAUCOMA    HYPERLIPIDEMIA    HYPERTENSION, BENIGN    MYOCARDIAL INFARCTION 06/08/2009   des to rca   Persistent atrial fibrillation (HCC)    Dx 08/2021   TOBACCO ABUSE    Assessment: Pharmacy consulted to dose heparin  in this 89 year old female admitted with PE.  Pt was on Eliquis  2.5 mg PO BID PTA, last dose on 12/6 @ 2100.  Goal of Therapy:  Heparin  level 0.3-0.7 units/ml aPTT 66 - 102  seconds Monitor platelets by anticoagulation protocol: Yes   12/9 1623 aPTT 121s, supratherapeutic @ 1100 u/hr 12/10 0155 aPTT 117s, supratherapeutic / HL 1.01 12/10 1052 aPTT 69s, therapeutic x 1 12/10 2223 aPTT 65, HL 0.73.  12/11 0421 aPTT 81 12/11 1148 aPTT 69 12/11 1922 aPTT 101, thera x 1; 950  un/h  12/12 0406 aPTT 137, HL 0.69 12/12 1231 HL 0.82 12/12 2257 HL 0.47, therapeutic x 1 12/13 0544 HL 0.47, therapeutic x 2 12/14 0617 HL 0.49, therapeutic x 3   Plan:  Will continue heparin  infusion at 750 units/hr. Recheck heparin  level daily w/ AM labs while therapeutic. CBC daily while on heparin .   Rankin [name], PharmD, [facility] 11/21/2024 6:55 AM

## 2024-11-21 NOTE — Progress Notes (Signed)
 PROGRESS NOTE   Jenny [name]   FMW:[id number] DOB: 11-Sep-1933  DOA: 11/14/2024 Date of Service: 11/21/2024 which is hospital day 7  PCP: [facility]    Hospital course / significant events:   HPI: Jenny [name] is a 89 y.o. female with medical history significant for HFpEF, hypertension, persistent A-fib on Eliquis , tachybradycardia syndrome, CAD, COPD, CKD stage 3b, type 2 diabetes, anemia. Presents to ED from [facility] (rehab), 3 days LE edema and R leg pain 1 day, also SOB  12/07: to ED early hours AM. CTA chest showed tiny segmental PE left lower lobe without heart strain, extensive multivessel CAD and mild asymmetric pulmonary edema around the left hilum. Initially was treated with DuoNebs and Solu-Medrol . She initially received LR boluses but subsequently given a dose of Lasix  as test results came in. Started on vancomycin  and Rocephin  and also on a heparin  infusion for PE. Admitted with question sepsis d/t lower extremity cellulitis, vs non-infectious SIRS multifactorial including acute dyspnea secondary to COPD and CHF exacerbation with a segmental PE seen on CTA chest. Podiatry consulted re: venous stasis ulcers on feet - bedisde debridement, appear superficial and recs for daily dressing changes, ACE wraps to knee, po abx on dc, WBAT, f/u outpatient 1 week after discharge   12/08: Vascular surgery consult d/t decreased ABI / PE> No thrombectomy given small PE. RLE angio planned for 12/10 12/09: stable 12/10: RLE angio and stents today. Cr 1.99 12/11: renal fxn improving Cr 1.56. Vascular surgery plans on taking the patient to the vascular lab on Monday, 11/22/2024 for a right lower extremity angiogram - pt to remain on heparin  infusion until after procedure  12/12: doing well / stable.  12/13: VERY short of breath w/ minimal exertion but improves quickly w/ rest. Changed nebs, will give 1 dose IV lasix  today  12/14: good UOP w/ IV lasix , increased  po dose today. Still quite SOB, wheezing. Continue ICS and po steroids, ABG pending may need BiPAP     Consultants:  Podiatry Vascular Surgery  Palliative Care   Procedures/Surgeries: RLE angiogram and stents 11/17/24       ASSESSMENT & PLAN:   LE edema / erythema  Multifactorial, include: Venous stasis dermatitis / ulceration  Cellulitis  Peripheral arterial disease LE edema d/t CHF / HFpEF exacerbation  Treat underlying cause(s) as noted   Dyspnea, multifactorial COPD likely exacerbated  CHF appears stable/euvolemic now Significant wheezing on exam minimal atelectasis/rales crackles  Resp PCR and COVID screen  [place] O2 ABG - if hypercarbic for sure will need BiPAP, if fairly normal will continue treat as-is, may need CPAP / BiPAP at bedtime if underlying OSA  Tx COPD and CHF as noted  Consider CT chest   Venous stasis  Venous stasis ulcers lower extremities bilaterally Question Cellulitis daily dressing changes, ACE wraps to knee Continue keflex  for 7-10d total  WBAT f/u podiatry outpatient 1 week after discharge  Peripheral arterial disease  S/p angiogram RLE w/ stenting 11/17/24 Vascular surgery following Vascular surgery plans on taking the patient to the vascular lab on Monday, 11/22/2024 for a right lower extremity angiogram - pt to remain on heparin  infusion until after procedure      Acute PE Very small.  On apixaban  2.5 bid at baseline, patient unsure if she's been receiving regularly at snf. Given cr under 1.5 assumes stays there would warrant full dose of 5 bid heparin  for now pending vascular angio See pharmacy note 12/11  for eliquis  - test claim for eliquis  5 mg bis $85  HFpEF with acute exacerbation Recent hospitalization with dose reduction in diuretic, likely under-diuresed (discharged on lasix  40, had previously been on bid dosing). Cr up from admission after IV diuresis, but stable lasix  40 mg oral daily--> bid  Monitor I&O Monitor  BMP    Hyperkalemia - resolved  K 5.3 treated with lokelma  improved Monitor BMP   AKI on ckd 3b Cr 1.39 on admission --> peak at 2.09 on 12/08 --> 11/21/2024 1.29 resuming oral lasix , would consider hold if kidney function worsens again Monitor BMP   COPD  Chronic hypoxic respiratory failure on 2L [place] O2 at baseline  not exacerbated.  Arrives w/ prednisone  20 on med list, says has been maintained on that for a couple of months decreased prednisone  to 10 daily, at that dose doesn't need pjp ppx will need close outpt pulm f/u O2 supplementation Adding schedule nebs given SOB today - budesonide  bid, duoneb q6h while awake Albuterol  prn    History tachy-brady A-fib Rate controlled currently heparin  as above Resume eliquis  on discharge as above - See pharmacy note 12/11 for eliquis  - test claim for eliquis  5 mg bis $85   Elevated troponin, ACS ruled out  Mild, stable, flat, likely demand as no chest pain Recheck troponin / EKG prn chest pain.    Neuropathy home gabapentin    Pulm nodule outpt f/u   Debility Comes from rehab PT consulted patient does not want to return to [facility]. TOC aware, looking elsewhere but if refusing available facility then will have to DC to home instead if unable to arrange alternative placement     Class 2 / borderline Class 3 obesity based on BMI: Body mass index is 38.1 kg/m.[name] Significantly low or high BMI is associated with higher medical risk.  Underweight - under 18  overweight - 25 to 29 obese - 30 or more Class 1 obesity: BMI of 30.0 to 34 Class 2 obesity: BMI of 35.0 to 39 Class 3 obesity: BMI of 40.0 to 49 Super Morbid Obesity: BMI 50-59 Super-super Morbid Obesity: BMI 60+ Healthy nutrition and physical activity advised as adjunct to other disease management and risk reduction treatments    DVT prophylaxis: heparin  IV fluids: no continuous IV fluids  Nutrition: cardiac diet  Central lines / other devices:  none  Code Status: DNR ACP documentation reviewed:  none on file in [facility]  [facility] needs: SNF rehab placement Medical barriers to dispo:  Expected medical readiness for discharge Monday after her next angio then .         Subjective / Brief ROS:  SOB w/ minimal exertion - pt states any time she moves or smoeone moves her she feels winded and breathin fast, chest pressure.  Pt reports SOB usually resolves with few minutes rest but doesn't seem to be resolving this morning, reports someone took her off her O2 briefly but not sure how long was off  Pain controlled.  Denies new weakness.  Tolerating diet.     Family Communication: none at bedside       Objective Findings:  EKG this morning w/ increased WOB - artifact limits read but appears rate controlled afib and minimally irregular rhythm   CXR this morning w/ increased WOB - no new concerns, possibly a bit more vascular congestion   Vitals:   11/21/24 0415 11/21/24 0809 11/21/24 0819 11/21/24 0922  BP: (!) 158/90 (!) 147/65    Pulse: (!) 47  89    Resp: 18 18    Temp: 98.4 F (36.9 C) (!) 97.5 F (36.4 C)    TempSrc:  Oral    SpO2: 95% 100% 99% 94%  Weight:      Height:        Intake/Output Summary (Last 24 hours) at 11/21/2024 1011 Last data filed at 11/21/2024 0745 Gross per 24 hour  Intake 202.93 ml  Output 1000 ml  Net -797.07 ml   Filed Weights   11/18/24 0533 11/19/24 0522 11/20/24 0500  Weight: 87.8 kg 88.3 kg 89.5 kg      Physical Exam Constitutional:      General: She is not in acute distress. Cardiovascular:     Rate and Rhythm: Normal rate and regular rhythm.  Pulmonary:     Effort: Tachypnea present. No respiratory distress.     Breath sounds: Decreased air movement present. Wheezing present.  Skin:    General: Skin is warm and dry.  Neurological:     Mental Status: She is alert.  Psychiatric:        Mood and Affect: Mood normal.        Behavior: Behavior normal.           Scheduled Medications:   budesonide  (PULMICORT ) nebulizer solution  0.25 mg Nebulization BID   cephALEXin   500 mg Oral Q8H   dorzolamide   1 drop Both Eyes BID   furosemide   40 mg Oral BID WC   gabapentin   100 mg Oral Daily   guaiFENesin   600 mg Oral BID   hydrocerin   Topical BID   ipratropium-albuterol   3 mL Nebulization TID   latanoprost   1 drop Both Eyes QHS   pantoprazole   40 mg Oral Daily   polyethylene glycol  17 g Oral Daily   predniSONE   10 mg Oral Q breakfast   rosuvastatin   20 mg Oral Daily    Continuous Infusions:  heparin  750 Units/hr (11/21/24 0745)    PRN Medications:  acetaminophen  **OR** acetaminophen , albuterol , hydrOXYzine , ondansetron  **OR** ondansetron  (ZOFRAN ) IV, mouth rinse, oxyCODONE , sodium phosphate   Antimicrobials from admission:  Anti-infectives (From admission, onward)    Start     Dose/Rate Route Frequency Ordered Stop   11/17/24 1112  ceFAZolin  (ANCEF ) IVPB 2g/100 mL premix        2 g 200 mL/hr over 30 Minutes Intravenous 30 min pre-op 11/17/24 1112 11/18/24 1430   11/16/24 1400  cephALEXin  (KEFLEX ) capsule 500 mg        500 mg Oral Every 8 hours 11/16/24 1157     11/16/24 0200  vancomycin  (VANCOREADY) IVPB 1250 mg/250 mL  Status:  Discontinued        1,250 mg 166.7 mL/hr over 90 Minutes Intravenous Every 48 hours 11/14/24 0522 11/14/24 0901   11/15/24 0130  cefTRIAXone  (ROCEPHIN ) 1 g in sodium chloride  0.9 % 100 mL IVPB  Status:  Discontinued        1 g 200 mL/hr over 30 Minutes Intravenous Every 24 hours 11/14/24 0448 11/14/24 0901   11/14/24 2200  ceFAZolin  (ANCEF ) IVPB 1 g/50 mL premix  Status:  Discontinued        1 g 100 mL/hr over 30 Minutes Intravenous Every 8 hours 11/14/24 0920 11/16/24 1157   11/14/24 1400  ceFAZolin  (ANCEF ) IVPB 1 g/50 mL premix  Status:  Discontinued        1 g 100 mL/hr over 30 Minutes Intravenous Every 8 hours 11/14/24 0919 11/14/24 0920   11/14/24 0500  vancomycin  (VANCOREADY) IVPB 1500  mg/300 mL        1,500 mg 150 mL/hr over 120 Minutes Intravenous  Once 11/14/24 0457 11/14/24 0805   11/14/24 0115  cefTRIAXone  (ROCEPHIN ) 2 g in sodium chloride  0.9 % 100 mL IVPB        2 g 200 mL/hr over 30 Minutes Intravenous Once 11/14/24 0108 11/14/24 0201   11/14/24 0115  vancomycin  (VANCOCIN ) IVPB 1000 mg/200 mL premix        1,000 mg 200 mL/hr over 60 Minutes Intravenous  Once 11/14/24 0108 11/14/24 0319           Data Reviewed:  I have personally reviewed the following...  CBC: Recent Labs  Lab 11/17/24 0452 11/18/24 0421 11/19/24 0406 11/20/24 0544 11/21/24 0617  WBC 5.9 6.0 7.4 6.5 7.3  HGB 8.4* 8.7* 8.5* 8.5* 9.1*  HCT 28.5* 29.5* 29.0* 29.3* 31.4*  MCV 86.6 85.3 86.3 86.7 86.5  PLT 242 283 317 321 353   Basic Metabolic Panel: Recent Labs  Lab 11/15/24 1410 11/16/24 0649 11/18/24 0421 11/19/24 0406 11/21/24 0617  NA 133* 133* 140 139 142  K 5.4* 4.9 4.4 4.1 4.0  CL 95* 97* 99 99 97*  CO2 22 25 32 30 35*  GLUCOSE 184* 128* 92 121* 133*  BUN 50* 52* 43* 42* 35*  CREATININE 2.09* 1.99* 1.56* 1.56* 1.29*  CALCIUM  8.9 8.8* 8.3* 8.8* 8.9   GFR: Estimated Creatinine Clearance: 30.8 mL/min (A) (by C-G formula based on SCr of 1.29 mg/dL (H)). Liver Function Tests: No results for input(s): AST, ALT, ALKPHOS, BILITOT, PROT, ALBUMIN in the last 168 hours.  No results for input(s): LIPASE, AMYLASE in the last 168 hours. No results for input(s): AMMONIA in the last 168 hours. Coagulation Profile: No results for input(s): INR, PROTIME in the last 168 hours.  Cardiac Enzymes: No results for input(s): CKTOTAL, CKMB, CKMBINDEX, TROPONINI in the last 168 hours. BNP (last 3 results) Recent Labs    11/14/24 0022  PROBNP 2,277.0*   HbA1C: No results for input(s): HGBA1C in the last 72 hours. CBG: Recent Labs  Lab 11/20/24 0842 11/20/24 1131 11/20/24 1619 11/20/24 2057 11/21/24 0808  GLUCAP 102* 137* 213* 208* 124*    Lipid Profile: No results for input(s): CHOL, HDL, LDLCALC, TRIG, CHOLHDL, LDLDIRECT in the last 72 hours. Thyroid  Function Tests: No results for input(s): TSH, T4TOTAL, FREET4, T3FREE, THYROIDAB in the last 72 hours. Anemia Panel: No results for input(s): VITAMINB12, FOLATE, FERRITIN, TIBC, IRON , RETICCTPCT in the last 72 hours. Most Recent Urinalysis On File:     Component Value Date/Time   COLORURINE YELLOW 10/06/2024 2103   APPEARANCEUR CLEAR 10/06/2024 2103   LABSPEC 1.008 10/06/2024 2103   PHURINE 6.0 10/06/2024 2103   GLUCOSEU NEGATIVE 10/06/2024 2103   HGBUR MODERATE (A) 10/06/2024 2103   BILIRUBINUR NEGATIVE 10/06/2024 2103   KETONESUR NEGATIVE 10/06/2024 2103   PROTEINUR NEGATIVE 10/06/2024 2103   NITRITE NEGATIVE 10/06/2024 2103   LEUKOCYTESUR NEGATIVE 10/06/2024 2103   Sepsis Labs: @LABRCNTIP (procalcitonin:4,lacticidven:4) Microbiology: Recent Results (from the past 240 hours)  Blood culture (routine x 2)     Status: None   Collection Time: 11/14/24  1:22 AM   Specimen: BLOOD  Result Value Ref Range Status   Specimen Description BLOOD RIGHT ANTECUBITAL  Final   Special Requests   Final    BOTTLES DRAWN AEROBIC AND ANAEROBIC Blood Culture results may not be optimal due to an inadequate volume of blood received in culture bottles  Culture   Final    NO GROWTH 5 DAYS Performed at [facility], [address] [place] [place], [place] [address]    Report Status 11/19/2024 FINAL  Final  Blood culture (routine x 2)     Status: None   Collection Time: 11/14/24  1:22 AM   Specimen: BLOOD  Result Value Ref Range Status   Specimen Description BLOOD BLOOD LEFT FOREARM  Final   Special Requests   Final    BOTTLES DRAWN AEROBIC AND ANAEROBIC Blood Culture adequate volume   Culture   Final    NO GROWTH 5 DAYS Performed at [facility], [address] [place], [place] [address]    Report Status 11/19/2024 FINAL  Final   Resp panel by RT-PCR (RSV, Flu A&B, Covid) Anterior Nasal Swab     Status: None   Collection Time: 11/14/24  1:22 AM   Specimen: Anterior Nasal Swab  Result Value Ref Range Status   SARS Coronavirus 2 by RT PCR NEGATIVE NEGATIVE Final    Comment: (NOTE) SARS-CoV-2 target nucleic acids are NOT DETECTED.  The SARS-CoV-2 RNA is generally detectable in upper respiratory specimens during the acute phase of infection. The lowest concentration of SARS-CoV-2 viral copies this assay can detect is 138 copies/mL. A negative result does not preclude SARS-Cov-2 infection and should not be used as the sole basis for treatment or other patient management decisions. A negative result may occur with  improper specimen collection/handling, submission of specimen other than nasopharyngeal swab, presence of viral mutation(s) within the areas targeted by this assay, and inadequate number of viral copies(<138 copies/mL). A negative result must be combined with clinical observations, patient history, and epidemiological information. The expected result is Negative.  Fact Sheet for Patients:  bloggercourse.com  Fact Sheet for Healthcare Providers:  seriousbroker.it  This test is no t yet approved or cleared by the [place]  FDA and  has been authorized for detection and/or diagnosis of SARS-CoV-2 by FDA under an Emergency Use Authorization (EUA). This EUA will remain  in effect (meaning this test can be used) for the duration of the COVID-19 declaration under Section 564(b)(1) of the Act, 21 U.S.C.section 360bbb-3(b)(1), unless the authorization is terminated  or revoked sooner.       Influenza A by PCR NEGATIVE NEGATIVE Final   Influenza B by PCR NEGATIVE NEGATIVE Final    Comment: (NOTE) The Xpert Xpress SARS-CoV-2/FLU/RSV plus assay is intended as an aid in the diagnosis of influenza from Nasopharyngeal swab specimens and should not be used  as a sole basis for treatment. Nasal washings and aspirates are unacceptable for Xpert Xpress SARS-CoV-2/FLU/RSV testing.  Fact Sheet for Patients: bloggercourse.com  Fact Sheet for Healthcare Providers: seriousbroker.it  This test is not yet approved or cleared by the [place]  FDA and has been authorized for detection and/or diagnosis of SARS-CoV-2 by FDA under an Emergency Use Authorization (EUA). This EUA will remain in effect (meaning this test can be used) for the duration of the COVID-19 declaration under Section 564(b)(1) of the Act, 21 U.S.C. section 360bbb-3(b)(1), unless the authorization is terminated or revoked.     Resp Syncytial Virus by PCR NEGATIVE NEGATIVE Final    Comment: (NOTE) Fact Sheet for Patients: bloggercourse.com  Fact Sheet for Healthcare Providers: seriousbroker.it  This test is not yet approved or cleared by the [place]  FDA and has been authorized for detection and/or diagnosis of SARS-CoV-2 by FDA under an Emergency Use Authorization (EUA). This EUA will remain in  effect (meaning this test can be used) for the duration of the COVID-19 declaration under Section 564(b)(1) of the Act, 21 U.S.C. section 360bbb-3(b)(1), unless the authorization is terminated or revoked.  Performed at [facility], [address], [place], [place] [address]       Radiology Studies last 3 days: [facility] Chest [facility] 1 View Result Date: 11/20/2024 CLINICAL DATA:  [id number] SOB (shortness of breath) [id number] EXAM: PORTABLE CHEST 1 VIEW COMPARISON:  November 14, 2024, July 01, 2024 FINDINGS: The cardiomediastinal silhouette is unchanged in contour.Atherosclerotic calcifications. RIGHT upper lobe pulmonary nodule, better assessed on recent CT. No pleural effusion. No pneumothorax. No acute pleuroparenchymal abnormality. IMPRESSION: 1. No acute cardiopulmonary  abnormality. 2. RIGHT upper lobe pulmonary nodule, better assessed on recent CT. Recommend management as outlined on December 7th CT. Electronically Signed   By: Corean Salter M.D.   On: 11/20/2024 11:25   PERIPHERAL VASCULAR CATHETERIZATION Result Date: 11/17/2024 See surgical note for result.     CRITICAL CARE Performed by: Laneta Blunt   Total critical care time: 35 minutes  Critical care time was exclusive of separately billable procedures and treating other patients.  Critical care was necessary to treat or prevent imminent or life-threatening deterioration.  Critical care was time spent personally by me on the following activities: development of treatment plan with patient and/or surrogate as well as nursing, discussions with consultants, evaluation of patient's response to treatment, examination of patient, obtaining history from patient or surrogate, ordering and performing treatments and interventions, ordering and review of laboratory studies, ordering and review of radiographic studies, pulse oximetry and re-evaluation of patient's condition.    Samhitha Rosen, DO [facility] 11/21/2024, 10:11 AM    Dictation software may have been used to generate the above note. Typos may occur and escape review in typed/dictated notes. Please contact Dr Blunt directly for clarity if needed.  Staff may message me via secure chat in Epic  but this may not receive an immediate response,  please page me for urgent matters!  If 7PM-7AM, please contact night coverage [email or web address]

## 2024-11-22 LAB — CBC
HCT: 28.4 % — ABNORMAL LOW (ref 36.0–46.0)
Hemoglobin: 8.2 g/dL — ABNORMAL LOW (ref 12.0–15.0)
MCH: 24.8 pg — ABNORMAL LOW (ref 26.0–34.0)
MCHC: 28.9 g/dL — ABNORMAL LOW (ref 30.0–36.0)
MCV: 85.8 fL (ref 80.0–100.0)
Platelets: 346 K/uL (ref 150–400)
RBC: 3.31 MIL/uL — ABNORMAL LOW (ref 3.87–5.11)
RDW: 19 % — ABNORMAL HIGH (ref 11.5–15.5)
WBC: 7.1 K/uL (ref 4.0–10.5)
nRBC: 1.4 % — ABNORMAL HIGH (ref 0.0–0.2)

## 2024-11-22 LAB — BASIC METABOLIC PANEL WITH GFR
Anion gap: 8 (ref 5–15)
BUN: 32 mg/dL — ABNORMAL HIGH (ref 8–23)
CO2: 37 mmol/L — ABNORMAL HIGH (ref 22–32)
Calcium: 8.8 mg/dL — ABNORMAL LOW (ref 8.9–10.3)
Chloride: 95 mmol/L — ABNORMAL LOW (ref 98–111)
Creatinine, Ser: 1.31 mg/dL — ABNORMAL HIGH (ref 0.44–1.00)
GFR, Estimated: 39 mL/min — ABNORMAL LOW (ref >60)
Glucose, Bld: 133 mg/dL — ABNORMAL HIGH (ref 70–99)
Potassium: 3.2 mmol/L — ABNORMAL LOW (ref 3.5–5.1)
Sodium: 139 mmol/L (ref 135–145)

## 2024-11-22 LAB — HEPARIN LEVEL (UNFRACTIONATED): Heparin Unfractionated: 0.43 [IU]/mL (ref 0.30–0.70)

## 2024-11-22 LAB — GLUCOSE, CAPILLARY
Glucose-Capillary: 79 mg/dL (ref 70–99)
Glucose-Capillary: 131 mg/dL — ABNORMAL HIGH (ref 70–99)
Glucose-Capillary: 270 mg/dL — ABNORMAL HIGH (ref 70–99)
Glucose-Capillary: 205 mg/dL — ABNORMAL HIGH (ref 70–99)

## 2024-11-22 MED ORDER — POTASSIUM CHLORIDE 20 MEQ PO PACK
40.0000 meq | PACK | Freq: Once | ORAL | Status: AC
  Administered 2024-11-22: 09:00:00 40 meq via ORAL
  Filled 2024-11-22: qty 2

## 2024-11-22 MED ORDER — PREDNISONE 20 MG PO TABS
40.0000 mg | ORAL_TABLET | Freq: Every day | ORAL | Status: AC
  Administered 2024-11-22 – 2024-11-26 (×5): 40 mg via ORAL
  Filled 2024-11-22 (×5): qty 2

## 2024-11-22 MED ORDER — PREDNISONE 10 MG PO TABS
10.0000 mg | ORAL_TABLET | Freq: Every day | ORAL | Status: DC
  Administered 2024-11-27 – 2024-12-04 (×6): 10 mg via ORAL
  Filled 2024-11-22 (×7): qty 1

## 2024-11-22 MED ORDER — MAGNESIUM CHLORIDE 64 MG PO TBEC
2.0000 | DELAYED_RELEASE_TABLET | Freq: Once | ORAL | Status: AC
  Administered 2024-11-22: 09:00:00 128 mg via ORAL
  Filled 2024-11-22: qty 2

## 2024-11-22 NOTE — Progress Notes (Signed)
 Physical Therapy Treatment Patient Details Name: Jenny [name] MRN: [id number] DOB: 1933-01-04 Today's Date: 11/22/2024   History of Present Illness Pt is an 89 y/o F admitted on 11/14/24 after presenting with c/o BLE pain, SOB. Pt is being treated for BLE cellulitis, also found to have acute PE. Pt is s/p RLE angio & stents on 11/17/24. Pt tested + for Influenza A on 11/21/24. PMH: HFpEF, HTN, persistent a-fib on Eliquis , tachybradycardia syndrome, CAD, COPD, CKD 3B, DM2, anemia    PT Comments  Pt seen for PT tx in handoff from OT. Pt requires encouragement re: participation but reluctantly agreeable. Pt is able to transfer sit>stand & step pivot with RW & CGA. Pt does progress to taking two steps forwards/backwards with RW & chair follow. Pt limited by SOB despite SpO2 98-100% on 3L/min. Recommend ongoing PT services to progress mobility as able.    If plan is discharge home, recommend the following: A little help with walking and/or transfers;A little help with bathing/dressing/bathroom;Assistance with cooking/housework;Assist for transportation;Help with stairs or ramp for entrance   Can travel by private vehicle     No  Equipment Recommendations  Other (comment) (defer to next venue)    Recommendations for Other Services       Precautions / Restrictions Precautions Precautions: Fall Precaution/Restrictions Comments: monitor O2 Restrictions Weight Bearing Restrictions Per Provider Order: No     Mobility  Bed Mobility                    Transfers   Equipment used: Rolling walker (2 wheels) Transfers: Sit to/from Stand Sit to Stand: Supervision, Contact guard assist   Step pivot transfers: Contact guard assist (bed<>BSC on L)       General transfer comment: sit<>stand from recliner with cuing re: hand placement, poor return demo of reaching back for recliner prior to stand>sit    Ambulation/Gait Ambulation/Gait assistance: Min assist Gait Distance (Feet): 2  Feet Assistive device: Rolling walker (2 wheels) Gait Pattern/deviations: Decreased step length - left, Decreased step length - right, Decreased stride length, Trunk flexed Gait velocity: decreased     General Gait Details: Pt able to take 2 steps forwards with each foot, RW, & min assist with PT providing chair follow for safety, has to sit 2/2 feeling SOB.   Stairs             Wheelchair Mobility     Tilt Bed    Modified Rankin (Stroke Patients Only)       Balance Overall balance assessment: Needs assistance, History of Falls Sitting-balance support: Feet supported Sitting balance-Leahy Scale: Good Sitting balance - Comments: peri hygiene sitting on BSC without LOB   Standing balance support: Bilateral upper extremity supported, During functional activity, Reliant on assistive device for balance Standing balance-Leahy Scale: Fair                              Hotel Manager: Impaired Factors Affecting Communication: Hearing impaired  Cognition Arousal: Alert Behavior During Therapy:  (irritable, resistive to education/mobility attempts at times)   PT - Cognitive impairments: No apparent impairments                         Following commands: Intact      Cueing Cueing Techniques: Verbal cues  Exercises      General Comments General comments (skin integrity, edema, etc.): continent void on BSC,  SPO2 98-100% on 3L/min but pt limited by SOB, educated pt on pursed lip breathing but pt with poor return demo.      Pertinent Vitals/Pain Pain Assessment Pain Assessment: No/denies pain    Home Living                          Prior Function            PT Goals (current goals can now be found in the care plan section) Acute Rehab PT Goals Patient Stated Goal: get better PT Goal Formulation: With patient Time For Goal Achievement: 11/30/24 Potential to Achieve Goals: Fair Progress towards PT  goals: Progressing toward goals    Frequency    Min 2X/week      PT Plan      Co-evaluation              AM-PAC PT 6 Clicks Mobility   Outcome Measure  Help needed turning from your back to your side while in a flat bed without using bedrails?: A Little Help needed moving from lying on your back to sitting on the side of a flat bed without using bedrails?: A Lot Help needed moving to and from a bed to a chair (including a wheelchair)?: A Little Help needed standing up from a chair using your arms (e.g., wheelchair or bedside chair)?: A Little Help needed to walk in hospital room?: A Little Help needed climbing 3-5 steps with a railing? : A Lot 6 Click Score: 16    End of Session Equipment Utilized During Treatment: Oxygen Activity Tolerance: Patient limited by fatigue Patient left: in chair;with call bell/phone within reach Nurse Communication: Mobility status PT Visit Diagnosis: Muscle weakness (generalized) (M62.81);History of falling (Z91.81);Difficulty in walking, not elsewhere classified (R26.2);Unsteadiness on feet (R26.81)     Time: 8499-8475 PT Time Calculation (min) (ACUTE ONLY): 24 min  Charges:    $Therapeutic Activity: 23-37 mins PT General Charges $$ ACUTE PT VISIT: 1 Visit                     Richerd Pinal, PT, DPT 11/22/2024, 3:31 PM   Richerd CHRISTELLA Pinal 11/22/2024, 3:31 PM

## 2024-11-22 NOTE — Progress Notes (Signed)
 Occupational Therapy Treatment Patient Details Name: Jenny [name] MRN: [id number] DOB: March 20, 1933 Today's Date: 11/22/2024   History of present illness Pt is an 89 y/o F admitted on 11/14/24 after presenting with c/o BLE pain, SOB. Pt is being treated for BLE cellulitis, also found to have acute PE. Pt is s/p RLE angio & stents on 11/17/24. Pt tested + for Influenza A on 11/21/24. PMH: HFpEF, HTN, persistent a-fib on Eliquis , tachybradycardia syndrome, CAD, COPD, CKD 3B, DM2, anemia   OT comments  [name] on arrival. Easily arousable and agreeable to OT session. She denies pain, continues to endorse SOB with minimal activity although sp02 readings remain in high 90s throughout session on 3L. She performed bed mobility with Mod A with HOB elevated and cues for hand placement and technique. Pt adamant to sit up and eat her lunch, tolerated x15 mins seated EOB with supervision while feeding herself. [name] to recliner following eating, required Min/CGA for STS from EOB and SPT from bed>recliner towards R side with cues for safety and hand placement. Handoff to PT upon OT exit. [name] to maximize her safety and IND to return to PLOF.       If plan is discharge home, recommend the following:  A little help with walking and/or transfers;A lot of help with bathing/dressing/bathroom;Assistance with cooking/housework;Assist for transportation;Help with stairs or ramp for entrance   Equipment Recommendations  None recommended by OT    Recommendations for Other Services      Precautions / Restrictions Precautions Precautions: Fall Recall of Precautions/Restrictions: Intact Restrictions Weight Bearing Restrictions Per Provider Order: No       Mobility Bed Mobility Overal bed mobility: Needs Assistance Bed Mobility: Supine to Sit, Sit to Supine     Supine to sit: Mod assist, HOB elevated, Used rails     General bed  mobility comments: BLE assist and minimal trunkal assist with HOB significantly elevated    Transfers Overall transfer level: Needs assistance Equipment used: Rolling walker (2 wheels) Transfers: Sit to/from Stand Sit to Stand: Min assist, Contact guard assist     Step pivot transfers: Contact guard assist (bed to recliner towards R)     General transfer comment: stood from EOB with cues for hand placement with Min A/CGA and able to step pivot to recliner towards R side with CGA, cues for safety     Balance Overall balance assessment: Needs assistance, History of Falls Sitting-balance support: Feet supported Sitting balance-Leahy Scale: Good     Standing balance support: Bilateral upper extremity supported, During functional activity, Reliant on assistive device for balance Standing balance-Leahy Scale: Fair Standing balance comment: heavy reliance of BUEs on RW                           ADL either performed or assessed with clinical judgement   ADL   Eating/Feeding: Independent;Sitting   Grooming: Wash/dry face;Set up;Sitting                                 General ADL Comments: assisted pt to sit at EOB and eat her lunch as she had been asleep and didn't know it had arrived; good seated balance at EOB x15 mins while eating    Extremity/Trunk Assessment  Vision       Perception     Praxis     Communication Communication Communication: Impaired Factors Affecting Communication: Hearing impaired   Cognition Arousal: Alert Behavior During Therapy: WFL for tasks assessed/performed Cognition: No apparent impairments                               Following commands: Intact        Cueing   Cueing Techniques: Verbal cues  Exercises      Shoulder Instructions       General Comments sp02 stable on 3L throughout session although pt reports SOB    Pertinent Vitals/ Pain       Pain Assessment Pain  Assessment: No/denies pain Pain Intervention(s): Monitored during session  Home Living                                          Prior Functioning/Environment              Frequency  Min 2X/week        Progress Toward Goals  OT Goals(current goals can now be found in the care plan section)  Progress towards OT goals: Progressing toward goals  Acute Rehab OT Goals Patient Stated Goal: get stronger OT Goal Formulation: With patient Time For Goal Achievement: 11/30/24 Potential to Achieve Goals: Good  Plan      Co-evaluation                 AM-PAC OT 6 Clicks Daily Activity     Outcome Measure   Help from another person eating meals?: None Help from another person taking care of personal grooming?: A Little Help from another person toileting, which includes using toliet, bedpan, or urinal?: A Lot Help from another person bathing (including washing, rinsing, drying)?: A Lot Help from another person to put on and taking off regular upper body clothing?: A Little Help from another person to put on and taking off regular lower body clothing?: A Lot 6 Click Score: 16    End of Session Equipment Utilized During Treatment: Oxygen;Rolling walker (2 wheels)  OT Visit Diagnosis: Unsteadiness on feet (R26.81);Other abnormalities of gait and mobility (R26.89);Muscle weakness (generalized) (M62.81);History of falling (Z91.81)   Activity Tolerance Patient limited by fatigue;Patient tolerated treatment well   Patient Left in chair;with call bell/phone within reach (handoff to PT)   Nurse Communication Mobility status        Time: 8571-8540 OT Time Calculation (min): 31 min  Charges: OT General Charges $OT Visit: 1 Visit OT Treatments $Therapeutic Activity: 8-22 mins  Jenny [name], OTR/L  11/22/2024, 4:09 PM   Jenny [name] 11/22/2024, 4:06 PM

## 2024-11-22 NOTE — Plan of Care (Signed)

## 2024-11-22 NOTE — Progress Notes (Signed)
 PROGRESS NOTE   Jenny [name]   FMW:[id number] DOB: 1933/09/27  DOA: 11/14/2024 Date of Service: 11/22/2024 which is hospital day 8  PCP: [name] course / significant events:   HPI: Jenny [name] is a 89 y.o. female with medical history significant for HFpEF, hypertension, persistent A-fib on Eliquis , tachybradycardia syndrome, CAD, COPD, CKD stage 3b, type 2 diabetes, anemia. Presents to ED from [facility] (rehab), 3 days LE edema and R leg pain 1 day, also SOB  12/07: to ED early hours AM. CTA chest showed tiny segmental PE left lower lobe without heart strain, extensive multivessel CAD and mild asymmetric pulmonary edema around the left hilum. Initially was treated with DuoNebs and Solu-Medrol . She initially received LR boluses but subsequently given a dose of Lasix  as test results came in. Started on vancomycin  and Rocephin  and also on a heparin  infusion for PE. Admitted with question sepsis d/t lower extremity cellulitis, vs non-infectious SIRS multifactorial including acute dyspnea secondary to COPD and CHF exacerbation. Podiatry consulted re: venous stasis ulcers on feet - bedisde debridement, appear superficial and recs for daily dressing changes, ACE wraps to knee, po abx on dc, WBAT, f/u outpatient 1 week after discharge   12/08: Vascular surgery consult d/t decreased ABI / PE> No thrombectomy given small PE. RLE angio planned for 12/10 12/09: stable 12/10: LLE angio and stents today. Cr 1.99 12/11: renal fxn improving Cr 1.56. Vascular surgery plans on taking the patient to the vascular lab on Monday, 11/22/2024 for RLE angiogram - pt to remain on heparin  infusion until after procedure  12/12: doing well / stable.  12/13: VERY short of breath w/ minimal exertion but improves quickly w/ rest. Changed nebs, will give 1 dose IV lasix  today  12/14: good UOP w/ IV lasix , increased po dose today. Still quite SOB, wheezing. (+)influenza A  --> tamiflu , postponing angio 12/15: continue tx COPD exacerbation d/t influenza      Consultants:  Podiatry Vascular Surgery  Palliative Care   Procedures/Surgeries: RLE angiogram and stents 11/17/24       ASSESSMENT & PLAN:   LE edema / erythema  Multifactorial, include: Venous stasis dermatitis / ulceration  Cellulitis  Peripheral arterial disease LE edema d/t CHF / HFpEF exacerbation  Treat underlying cause(s) as noted   Dyspnea, multifactorial COPD likely exacerbated d/t influenza A infection  CHF appears stable/euvolemic now post-treatment  Significant wheezing on exam minimal atelectasis/rales crackles  [place] O2 Tx Flu, COPD, and CHF as noted below  COPD  Chronic hypoxic respiratory failure on 2L [place] O2 at baseline  Exacerbated now d/t influenza A Arrives w/ prednisone  20 on med list, says has been maintained on that for a couple of months prednisone  to 10 daily --> burst 40 mg daily x5 days given new exacerbation, then ordered back to 10 mg daily to restart Saturday  O2 supplementation Tamiflu  per pharmacy renal dose  Adding schedule nebs --> budesonide  bid, duoneb q6h while awake Albuterol  prn  will need close outpt pulm f/u  Venous stasis  Venous stasis ulcers lower extremities bilaterally Question Cellulitis daily dressing changes, ACE wraps to knee Continue keflex  for 7-10d total  WBAT f/u podiatry outpatient 1 week after discharge  Peripheral arterial disease  S/p angiogram RLE w/ stenting 11/17/24 Vascular surgery following Vascular surgery had planned on taking the patient to the vascular lab on Monday, 11/22/2024 for a right lower extremity angiogram - pt to remain on heparin   infusion until after procedure --> POSTPONED d/t influenza infection reschedule per vascular team    Acute PE Very small.  On apixaban  2.5 bid at baseline, patient unsure if she's been receiving regularly at snf. Given cr under 1.5 assumes stays there would  warrant full dose of 5 bid heparin  for now pending vascular angio See pharmacy note 12/11 for eliquis  - test claim for eliquis  5 mg bis $85  HFpEF with acute exacerbation - now stabilized  Recent hospitalization with dose reduction in diuretic, likely under-diuresed (discharged on lasix  40, had previously been on bid dosing). Cr up from admission after IV diuresis, but stable lasix  40 mg oral daily--> bid  Monitor I&O Monitor BMP    Hyperkalemia - resolved  K 5.3 treated with lokelma  improved Monitor BMP   AKI on ckd 3b Cr 1.39 on admission --> peak at 2.09 on 12/08 --> 11/21/2024 1.29 resuming oral lasix , would consider hold if kidney function worsens again Monitor BMP   History tachy-brady A-fib Rate controlled currently heparin  as above Resume eliquis  on discharge as above - See pharmacy note 12/11 for eliquis  - test claim for eliquis  5 mg bis $85   Elevated troponin, ACS ruled out  Mild, stable, flat, likely demand as no chest pain Recheck troponin / EKG prn chest pain.    Neuropathy home gabapentin    Pulm nodule outpt f/u   Hypokalemia Replace as needed Monitor BMP, Mg   Debility Comes from rehab PT consulted patient does not want to return to [facility]. TOC aware, looking elsewhere but if refusing available facility then will have to DC to home instead if unable to arrange alternative placement     Class 2 / borderline Class 3 obesity based on BMI: Body mass index is 38.1 kg/m.[name] Significantly low or high BMI is associated with higher medical risk.  Underweight - under 18  overweight - 25 to 29 obese - 30 or more Class 1 obesity: BMI of 30.0 to 34 Class 2 obesity: BMI of 35.0 to 39 Class 3 obesity: BMI of 40.0 to 49 Super Morbid Obesity: BMI 50-59 Super-super Morbid Obesity: BMI 60+ Healthy nutrition and physical activity advised as adjunct to other disease management and risk reduction treatments    DVT prophylaxis: heparin  IV fluids: no  continuous IV fluids  Nutrition: cardiac diet  Central lines / other devices: none  Code Status: DNR ACP documentation reviewed:  none on file in [facility]  TOC needs: SNF rehab placement Medical barriers to dispo:  Expected medical readiness for discharge pending angio which was delayed d/t influenza infection        Subjective / Brief ROS:  Pt feeing a bit better today, still significant SOB Pain controlled.  Denies new weakness.  Tolerating diet.     Family Communication: spoke yesterday w/ son       Objective Findings:   Vitals:   11/21/24 2110 11/22/24 0556 11/22/24 0819 11/22/24 1125  BP: (!) 144/71 132/70 (!) 152/80 132/68  Pulse: 94 60 (!) 101 86  Resp: 17 17 18    Temp: 97.7 F (36.5 C) 97.6 F (36.4 C) 97.9 F (36.6 C)   TempSrc: Oral Oral Oral   SpO2: 100% 96% (!) 87% 99%  Weight:  88 kg    Height:        Intake/Output Summary (Last 24 hours) at 11/22/2024 1424 Last data filed at 11/22/2024 0020 Gross per 24 hour  Intake 60.24 ml  Output 980 ml  Net -919.76 ml  Filed Weights   11/19/24 0522 11/20/24 0500 11/22/24 0556  Weight: 88.3 kg 89.5 kg 88 kg      Physical Exam Constitutional:      General: She is not in acute distress. Cardiovascular:     Rate and Rhythm: Normal rate and regular rhythm.  Pulmonary:     Effort: Tachypnea present. No respiratory distress.     Breath sounds: Decreased air movement present. Wheezing (some improved from yesterday) present.  Skin:    General: Skin is warm and dry.  Neurological:     Mental Status: She is alert.  Psychiatric:        Mood and Affect: Mood normal.        Behavior: Behavior normal.          Scheduled Medications:   budesonide  (PULMICORT ) nebulizer solution  0.25 mg Nebulization BID   cephALEXin   500 mg Oral Q8H   dorzolamide   1 drop Both Eyes BID   furosemide   40 mg Oral BID WC   gabapentin   100 mg Oral Daily   guaiFENesin   600 mg Oral BID   hydrocerin   Topical BID    ipratropium-albuterol   3 mL Nebulization TID   latanoprost   1 drop Both Eyes QHS   oseltamivir   30 mg Oral BID   pantoprazole   40 mg Oral Daily   polyethylene glycol  17 g Oral Daily   [START ON 11/27/2024] predniSONE   10 mg Oral Q breakfast   predniSONE   40 mg Oral Q breakfast   rosuvastatin   20 mg Oral Daily    Continuous Infusions:  heparin  750 Units/hr (11/22/24 0917)    PRN Medications:  acetaminophen  **OR** acetaminophen , albuterol , hydrOXYzine , ondansetron  **OR** ondansetron  (ZOFRAN ) IV, mouth rinse, oxyCODONE , sodium phosphate   Antimicrobials from admission:  Anti-infectives (From admission, onward)    Start     Dose/Rate Route Frequency Ordered Stop   11/21/24 2200  oseltamivir  (TAMIFLU ) capsule 30 mg       Placed in Followed by Linked Group   30 mg Oral 2 times daily 11/21/24 1240 11/26/24 0959   11/21/24 1330  oseltamivir  (TAMIFLU ) capsule 75 mg       Placed in Followed by Linked Group   75 mg Oral  Once 11/21/24 1240 11/21/24 1434   11/17/24 1112  ceFAZolin  (ANCEF ) IVPB 2g/100 mL premix        2 g 200 mL/hr over 30 Minutes Intravenous 30 min pre-op 11/17/24 1112 11/18/24 1430   11/16/24 1400  cephALEXin  (KEFLEX ) capsule 500 mg        500 mg Oral Every 8 hours 11/16/24 1157     11/16/24 0200  vancomycin  (VANCOREADY) IVPB 1250 mg/250 mL  Status:  Discontinued        1,250 mg 166.7 mL/hr over 90 Minutes Intravenous Every 48 hours 11/14/24 0522 11/14/24 0901   11/15/24 0130  cefTRIAXone  (ROCEPHIN ) 1 g in sodium chloride  0.9 % 100 mL IVPB  Status:  Discontinued        1 g 200 mL/hr over 30 Minutes Intravenous Every 24 hours 11/14/24 0448 11/14/24 0901   11/14/24 2200  ceFAZolin  (ANCEF ) IVPB 1 g/50 mL premix  Status:  Discontinued        1 g 100 mL/hr over 30 Minutes Intravenous Every 8 hours 11/14/24 0920 11/16/24 1157   11/14/24 1400  ceFAZolin  (ANCEF ) IVPB 1 g/50 mL premix  Status:  Discontinued        1 g 100 mL/hr over 30 Minutes Intravenous Every 8  hours  11/14/24 0919 11/14/24 0920   11/14/24 0500  vancomycin  (VANCOREADY) IVPB 1500 mg/300 mL        1,500 mg 150 mL/hr over 120 Minutes Intravenous  Once 11/14/24 0457 11/14/24 0805   11/14/24 0115  cefTRIAXone  (ROCEPHIN ) 2 g in sodium chloride  0.9 % 100 mL IVPB        2 g 200 mL/hr over 30 Minutes Intravenous Once 11/14/24 0108 11/14/24 0201   11/14/24 0115  vancomycin  (VANCOCIN ) IVPB 1000 mg/200 mL premix        1,000 mg 200 mL/hr over 60 Minutes Intravenous  Once 11/14/24 0108 11/14/24 0319           Data Reviewed:  I have personally reviewed the following...  CBC: Recent Labs  Lab 11/18/24 0421 11/19/24 0406 11/20/24 0544 11/21/24 0617 11/22/24 0623  WBC 6.0 7.4 6.5 7.3 7.1  HGB 8.7* 8.5* 8.5* 9.1* 8.2*  HCT 29.5* 29.0* 29.3* 31.4* 28.4*  MCV 85.3 86.3 86.7 86.5 85.8  PLT 283 317 321 353 346   Basic Metabolic Panel: Recent Labs  Lab 11/16/24 0649 11/18/24 0421 11/19/24 0406 11/21/24 0617 11/22/24 0623  NA 133* 140 139 142 139  K 4.9 4.4 4.1 4.0 3.2*  CL 97* 99 99 97* 95*  CO2 25 32 30 35* 37*  GLUCOSE 128* 92 121* 133* 133*  BUN 52* 43* 42* 35* 32*  CREATININE 1.99* 1.56* 1.56* 1.29* 1.31*  CALCIUM  8.8* 8.3* 8.8* 8.9 8.8*   GFR: Estimated Creatinine Clearance: 30 mL/min (A) (by C-G formula based on SCr of 1.31 mg/dL (H)). Liver Function Tests: No results for input(s): AST, ALT, ALKPHOS, BILITOT, PROT, ALBUMIN in the last 168 hours.  No results for input(s): LIPASE, AMYLASE in the last 168 hours. No results for input(s): AMMONIA in the last 168 hours. Coagulation Profile: No results for input(s): INR, PROTIME in the last 168 hours.  Cardiac Enzymes: No results for input(s): CKTOTAL, CKMB, CKMBINDEX, TROPONINI in the last 168 hours. BNP (last 3 results) Recent Labs    11/14/24 0022  PROBNP 2,277.0*   HbA1C: No results for input(s): HGBA1C in the last 72 hours. CBG: Recent Labs  Lab 11/21/24 1250 11/21/24 1650  11/21/24 2114 11/22/24 0818 11/22/24 1158  GLUCAP 170* 177* 173* 79 131*   Lipid Profile: No results for input(s): CHOL, HDL, LDLCALC, TRIG, CHOLHDL, LDLDIRECT in the last 72 hours. Thyroid  Function Tests: No results for input(s): TSH, T4TOTAL, FREET4, T3FREE, THYROIDAB in the last 72 hours. Anemia Panel: No results for input(s): VITAMINB12, FOLATE, FERRITIN, TIBC, IRON , RETICCTPCT in the last 72 hours. Most Recent Urinalysis On File:     Component Value Date/Time   COLORURINE YELLOW 10/06/2024 2103   APPEARANCEUR CLEAR 10/06/2024 2103   LABSPEC 1.008 10/06/2024 2103   PHURINE 6.0 10/06/2024 2103   GLUCOSEU NEGATIVE 10/06/2024 2103   HGBUR MODERATE (A) 10/06/2024 2103   BILIRUBINUR NEGATIVE 10/06/2024 2103   KETONESUR NEGATIVE 10/06/2024 2103   PROTEINUR NEGATIVE 10/06/2024 2103   NITRITE NEGATIVE 10/06/2024 2103   LEUKOCYTESUR NEGATIVE 10/06/2024 2103   Sepsis Labs: @LABRCNTIP (procalcitonin:4,lacticidven:4) Microbiology: Recent Results (from the past 240 hours)  Blood culture (routine x 2)     Status: None   Collection Time: 11/14/24  1:22 AM   Specimen: BLOOD  Result Value Ref Range Status   Specimen Description BLOOD RIGHT ANTECUBITAL  Final   Special Requests   Final    BOTTLES DRAWN AEROBIC AND ANAEROBIC Blood Culture results may not be optimal due to an  inadequate volume of blood received in culture bottles   Culture   Final    NO GROWTH 5 DAYS Performed at [facility], [address] [place], [place] [address]    Report Status 11/19/2024 FINAL  Final  Blood culture (routine x 2)     Status: None   Collection Time: 11/14/24  1:22 AM   Specimen: BLOOD  Result Value Ref Range Status   Specimen Description BLOOD BLOOD LEFT FOREARM  Final   Special Requests   Final    BOTTLES DRAWN AEROBIC AND ANAEROBIC Blood Culture adequate volume   Culture   Final    NO GROWTH 5 DAYS Performed at [facility], [address] [place], [place] [address]    Report Status 11/19/2024 FINAL  Final  Resp panel by RT-PCR (RSV, Flu A&B, Covid) Anterior Nasal Swab     Status: None   Collection Time: 11/14/24  1:22 AM   Specimen: Anterior Nasal Swab  Result Value Ref Range Status   SARS Coronavirus 2 by RT PCR NEGATIVE NEGATIVE Final    Comment: (NOTE) SARS-CoV-2 target nucleic acids are NOT DETECTED.  The SARS-CoV-2 RNA is generally detectable in upper respiratory specimens during the acute phase of infection. The lowest concentration of SARS-CoV-2 viral copies this assay can detect is 138 copies/mL. A negative result does not preclude SARS-Cov-2 infection and should not be used as the sole basis for treatment or other patient management decisions. A negative result may occur with  improper specimen collection/handling, submission of specimen other than nasopharyngeal swab, presence of viral mutation(s) within the areas targeted by this assay, and inadequate number of viral copies(<138 copies/mL). A negative result must be combined with clinical observations, patient history, and epidemiological information. The expected result is Negative.  Fact Sheet for Patients:  bloggercourse.com  Fact Sheet for Healthcare Providers:  seriousbroker.it  This test is no t yet approved or cleared by the [place]  FDA and  has been authorized for detection and/or diagnosis of SARS-CoV-2 by FDA under an Emergency Use Authorization (EUA). This EUA will remain  in effect (meaning this test can be used) for the duration of the COVID-19 declaration under Section 564(b)(1) of the Act, 21 U.S.C.section 360bbb-3(b)(1), unless the authorization is terminated  or revoked sooner.       Influenza A by PCR NEGATIVE NEGATIVE Final   Influenza B by PCR NEGATIVE NEGATIVE Final    Comment: (NOTE) The Xpert Xpress SARS-CoV-2/FLU/RSV plus assay is intended as an aid in the  diagnosis of influenza from Nasopharyngeal swab specimens and should not be used as a sole basis for treatment. Nasal washings and aspirates are unacceptable for Xpert Xpress SARS-CoV-2/FLU/RSV testing.  Fact Sheet for Patients: bloggercourse.com  Fact Sheet for Healthcare Providers: seriousbroker.it  This test is not yet approved or cleared by the [place]  FDA and has been authorized for detection and/or diagnosis of SARS-CoV-2 by FDA under an Emergency Use Authorization (EUA). This EUA will remain in effect (meaning this test can be used) for the duration of the COVID-19 declaration under Section 564(b)(1) of the Act, 21 U.S.C. section 360bbb-3(b)(1), unless the authorization is terminated or revoked.     Resp Syncytial Virus by PCR NEGATIVE NEGATIVE Final    Comment: (NOTE) Fact Sheet for Patients: bloggercourse.com  Fact Sheet for Healthcare Providers: seriousbroker.it  This test is not yet approved or cleared by the [place]  FDA and has been authorized for detection and/or diagnosis of SARS-CoV-2 by FDA under  an Emergency Use Authorization (EUA). This EUA will remain in effect (meaning this test can be used) for the duration of the COVID-19 declaration under Section 564(b)(1) of the Act, 21 U.S.C. section 360bbb-3(b)(1), unless the authorization is terminated or revoked.  Performed at [facility], [address] [place], [place] [address]   Resp panel by RT-PCR (RSV, Flu A&B, Covid) Anterior Nasal Swab     Status: Abnormal   Collection Time: 11/21/24 11:27 AM   Specimen: Anterior Nasal Swab  Result Value Ref Range Status   SARS Coronavirus 2 by RT PCR NEGATIVE NEGATIVE Final    Comment: (NOTE) SARS-CoV-2 target nucleic acids are NOT DETECTED.  The SARS-CoV-2 RNA is generally detectable in upper respiratory specimens during the acute phase of  infection. The lowest concentration of SARS-CoV-2 viral copies this assay can detect is 138 copies/mL. A negative result does not preclude SARS-Cov-2 infection and should not be used as the sole basis for treatment or other patient management decisions. A negative result may occur with  improper specimen collection/handling, submission of specimen other than nasopharyngeal swab, presence of viral mutation(s) within the areas targeted by this assay, and inadequate number of viral copies(<138 copies/mL). A negative result must be combined with clinical observations, patient history, and epidemiological information. The expected result is Negative.  Fact Sheet for Patients:  bloggercourse.com  Fact Sheet for Healthcare Providers:  seriousbroker.it  This test is no t yet approved or cleared by the [place]  FDA and  has been authorized for detection and/or diagnosis of SARS-CoV-2 by FDA under an Emergency Use Authorization (EUA). This EUA will remain  in effect (meaning this test can be used) for the duration of the COVID-19 declaration under Section 564(b)(1) of the Act, 21 U.S.C.section 360bbb-3(b)(1), unless the authorization is terminated  or revoked sooner.       Influenza A by PCR POSITIVE (A) NEGATIVE Final   Influenza B by PCR NEGATIVE NEGATIVE Final    Comment: (NOTE) The Xpert Xpress SARS-CoV-2/FLU/RSV plus assay is intended as an aid in the diagnosis of influenza from Nasopharyngeal swab specimens and should not be used as a sole basis for treatment. Nasal washings and aspirates are unacceptable for Xpert Xpress SARS-CoV-2/FLU/RSV testing.  Fact Sheet for Patients: bloggercourse.com  Fact Sheet for Healthcare Providers: seriousbroker.it  This test is not yet approved or cleared by the [place]  FDA and has been authorized for detection and/or diagnosis of  SARS-CoV-2 by FDA under an Emergency Use Authorization (EUA). This EUA will remain in effect (meaning this test can be used) for the duration of the COVID-19 declaration under Section 564(b)(1) of the Act, 21 U.S.C. section 360bbb-3(b)(1), unless the authorization is terminated or revoked.     Resp Syncytial Virus by PCR NEGATIVE NEGATIVE Final    Comment: (NOTE) Fact Sheet for Patients: bloggercourse.com  Fact Sheet for Healthcare Providers: seriousbroker.it  This test is not yet approved or cleared by the [place]  FDA and has been authorized for detection and/or diagnosis of SARS-CoV-2 by FDA under an Emergency Use Authorization (EUA). This EUA will remain in effect (meaning this test can be used) for the duration of the COVID-19 declaration under Section 564(b)(1) of the Act, 21 U.S.C. section 360bbb-3(b)(1), unless the authorization is terminated or revoked.  Performed at [facility], [address] [place], [place] [address]   Respiratory (~20 pathogens) panel by PCR     Status: Abnormal   Collection Time: 11/21/24 11:27 AM   Specimen: Nasopharyngeal Swab;  Respiratory  Result Value Ref Range Status   Adenovirus NOT DETECTED NOT DETECTED Final   Coronavirus 229E NOT DETECTED NOT DETECTED Final    Comment: (NOTE) The Coronavirus on the Respiratory Panel, DOES NOT test for the novel  Coronavirus (2019 nCoV)    Coronavirus HKU1 NOT DETECTED NOT DETECTED Final   Coronavirus NL63 NOT DETECTED NOT DETECTED Final   Coronavirus OC43 NOT DETECTED NOT DETECTED Final   Metapneumovirus NOT DETECTED NOT DETECTED Final   Rhinovirus / Enterovirus NOT DETECTED NOT DETECTED Final   Influenza A H3 DETECTED (A) NOT DETECTED Final   Influenza B NOT DETECTED NOT DETECTED Final   Parainfluenza Virus 1 NOT DETECTED NOT DETECTED Final   Parainfluenza Virus 2 NOT DETECTED NOT DETECTED Final   Parainfluenza Virus 3 NOT DETECTED  NOT DETECTED Final   Parainfluenza Virus 4 NOT DETECTED NOT DETECTED Final   Respiratory Syncytial Virus NOT DETECTED NOT DETECTED Final   Bordetella pertussis NOT DETECTED NOT DETECTED Final   Bordetella Parapertussis NOT DETECTED NOT DETECTED Final   Chlamydophila pneumoniae NOT DETECTED NOT DETECTED Final   Mycoplasma pneumoniae NOT DETECTED NOT DETECTED Final    Comment: Performed at [facility], [address], [place], [place] [address]      Radiology Studies last 3 days: [facility] Chest Port 1 View Result Date: 11/20/2024 CLINICAL DATA:  [id number] SOB (shortness of breath) [id number] EXAM: PORTABLE CHEST 1 VIEW COMPARISON:  November 14, 2024, July 01, 2024 FINDINGS: The cardiomediastinal silhouette is unchanged in contour.Atherosclerotic calcifications. RIGHT upper lobe pulmonary nodule, better assessed on recent CT. No pleural effusion. No pneumothorax. No acute pleuroparenchymal abnormality. IMPRESSION: 1. No acute cardiopulmonary abnormality. 2. RIGHT upper lobe pulmonary nodule, better assessed on recent CT. Recommend management as outlined on December 7th CT. Electronically Signed   By: Corean Salter M.D.   On: 11/20/2024 11:25      CRITICAL CARE Performed by: Laneta Blunt   Total critical care time: 35 minutes  Critical care time was exclusive of separately billable procedures and treating other patients.  Critical care was necessary to treat or prevent imminent or life-threatening deterioration.  Critical care was time spent personally by me on the following activities: development of treatment plan with patient and/or surrogate as well as nursing, discussions with consultants, evaluation of patient's response to treatment, examination of patient, obtaining history from patient or surrogate, ordering and performing treatments and interventions, ordering and review of laboratory studies, ordering and review of radiographic studies, pulse oximetry and re-evaluation of  patient's condition.    Elvera Almario, DO [facility] 11/22/2024, 2:24 PM    Dictation software may have been used to generate the above note. Typos may occur and escape review in typed/dictated notes. Please contact Dr Blunt directly for clarity if needed.  Staff may message me via secure chat in Epic  but this may not receive an immediate response,  please page me for urgent matters!  If 7PM-7AM, please contact night coverage [email or web address]

## 2024-11-22 NOTE — Progress Notes (Signed)
 Progress Note    11/22/2024 5:27 PM 5 Days Post-Op  Subjective:   Jenny [name] is an 89 yo female who is now POD #5 from:   Date of Surgery: 11/17/2024   Surgeon(s):DEW,JASON     Assistants:none   Pre-operative Diagnosis: PAD with ulceration of the right lower extremity   Post-operative diagnosis:  Same   Procedure(s) Performed:             1.  Ultrasound guidance for vascular access left femoral artery             2.  Catheter placement into right common femoral artery from left femoral approach             3.  Aortogram and selective right lower extremity angiogram             4.  Percutaneous transluminal angioplasty of right distal SFA with 5 mm diameter Lutonix drug-coated angioplasty balloon             5.  Stent placement to the right distal SFA with 6 mm diameter by 10 cm length life stent             6.  Stent placement to the right external iliac artery with 8 mm diameter by 8 cm length life star stent             7.  Celt closure device left femoral artery   EBL: 5 cc   Contrast: 50 cc   Fluoro Time: 5.3 minutes   Patient is seen comfortably in bed this morning.  She endorses that her left leg feels much better than it did yesterday.  She endorses she continues to have the same pain and problems with her right leg.  She is insisting that we investigate and evaluate her right lower extremity pain.  No complaints overnight vitals all remained stable.   Vitals:   11/22/24 1125 11/22/24 1550  BP: 132/68 121/66  Pulse: 86 85  Resp:  18  Temp:  97.9 F (36.6 C)  SpO2: 99% 100%   Physical Exam: Cardiac:  RRR, Normal S1, S2. No murmurs. Lungs:  Positive labored breathing due to her COPD with 2 liters [place] oxygen. Positive Rhonchi throughout. No rales but diminished breath sounds in the bases.  Incisions: Left Femoral Artery Puncture site with dressing clean dry and intact. No S&S of hematoma, seroma or infection to note.  Extremities:  All extremities are warm to  touch with palpable pulses in upper extremities. Lower extremities are bandaged but assessment of toes being purple. Only able to get doppler DP/PT pulses and they are weak  Abdomen:  Positive bowel sounds throughout, morbid obesity, soft non tender  Neurologic: AAOX3 answers all questions and follows commands.   CBC    Component Value Date/Time   WBC 7.1 11/22/2024 0623   RBC 3.31 (L) 11/22/2024 0623   HGB 8.2 (L) 11/22/2024 0623   HCT 28.4 (L) 11/22/2024 0623   PLT 346 11/22/2024 0623   MCV 85.8 11/22/2024 0623   MCH 24.8 (L) 11/22/2024 0623   MCHC 28.9 (L) 11/22/2024 0623   RDW 19.0 (H) 11/22/2024 0623   LYMPHSABS 0.3 (L) 11/14/2024 0022   MONOABS 0.7 11/14/2024 0022   EOSABS 0.0 11/14/2024 0022   BASOSABS 0.0 11/14/2024 0022    BMET    Component Value Date/Time   NA 139 11/22/2024 0623   NA 139 10/21/2024 1046   K 3.2 (L) 11/22/2024 0623   CL 95 (  L) 11/22/2024 0623   CO2 37 (H) 11/22/2024 0623   GLUCOSE 133 (H) 11/22/2024 0623   BUN 32 (H) 11/22/2024 0623   BUN 48 (H) 10/21/2024 1046   CREATININE 1.31 (H) 11/22/2024 0623   CALCIUM  8.8 (L) 11/22/2024 0623   GFRNONAA 39 (L) 11/22/2024 0623   GFRAA  10/09/2009 0350    >60        The eGFR has been calculated using the MDRD equation. This calculation has not been validated in all clinical situations. eGFR's persistently <60 mL/min signify possible Chronic Kidney Disease.    INR    Component Value Date/Time   INR 1.3 (H) 11/14/2024 0354     Intake/Output Summary (Last 24 hours) at 11/22/2024 1727 Last data filed at 11/22/2024 0020 Gross per 24 hour  Intake --  Output 330 ml  Net -330 ml     Assessment/Plan:  89 y.o. female is s/p SEE ABOVE 5 Days Post-Op  PLAN Vascular surgery had plan on taking the patient to the vascular lab today to complete the right lower extremity angiogram with possible intervention.  I was notified earlier this morning by the hospitalist Dr. Laneta Blunt that the patient  has contracted the flu and was having some difficulty breathing this morning.  Therefore we will delay the right lower extremity angiogram until the patient is breathing better and feels better.  She will have to lie flat for this procedure and therefore she will have to breathe well on her own.  Vascular surgery will continue to follow and is soon as the hospitalist team feels she is ready we will reschedule her for this angiogram.  DVT prophylaxis: Heparin  infusion   [name] [facility] 11/22/2024 5:27 PM

## 2024-11-22 NOTE — Progress Notes (Signed)
 PHARMACY - ANTICOAGULATION CONSULT NOTE  Pharmacy Consult for IV Heparin   Indication: pulmonary embolus  Patient Measurements: Height: 5' 2 (157.5 cm) Weight: 88 kg (194 lb 1.6 oz) IBW/kg (Calculated) : 50.1 HEPARIN  DW (KG): 71.4  Labs: Recent Labs    11/20/24 0544 11/21/24 0617 11/22/24 0623  HGB 8.5* 9.1* 8.2*  HCT 29.3* 31.4* 28.4*  PLT 321 353 346  HEPARINUNFRC 0.47 0.49 0.43  CREATININE  --  1.29*  --    Estimated Creatinine Clearance: 30.5 mL/min (A) (by C-G formula based on SCr of 1.29 mg/dL (H)).  Medical History: Past Medical History:  Diagnosis Date   ACUT DUOD ULCER W/HEMORR&PERF W/O MENTION OBST 10/05/2009   NSAID induced   ALLERGIC RHINITIS CAUSE UNSPECIFIED    ANEMIA-NOS    CAD (coronary artery disease) 06/08/2009   DEs RCA with 70% LAD and EF 60%   CHF (congestive heart failure) (HCC)    COPD    mild obst on PFTs 03/2010   Diabetes mellitus 06/2010 dx   Mild, diet controlled   GLAUCOMA    HYPERLIPIDEMIA    HYPERTENSION, BENIGN    MYOCARDIAL INFARCTION 06/08/2009   des to rca   Persistent atrial fibrillation (HCC)    Dx 08/2021   TOBACCO ABUSE    Assessment: Pharmacy consulted to dose heparin  in this 89 year old female admitted with PE.  Pt was on Eliquis  2.5 mg PO BID PTA, last dose on 12/6 @ 2100.  Goal of Therapy:  Heparin  level 0.3-0.7 units/ml aPTT 66 - 102  seconds Monitor platelets by anticoagulation protocol: Yes   12/9 1623 aPTT 121s, supratherapeutic @ 1100 u/hr 12/10 0155 aPTT 117s, supratherapeutic / HL 1.01 12/10 1052 aPTT 69s, therapeutic x 1 12/10 2223 aPTT 65, HL 0.73.  12/11 0421 aPTT 81 12/11 1148 aPTT 69 12/11 1922 aPTT 101, thera x 1; 950 un/h  12/12 0406 aPTT 137, HL 0.69 12/12 1231 HL 0.82 12/12 2257 HL 0.47, therapeutic x 1 12/13 0544 HL 0.47, therapeutic x 2 12/14 0617 HL 0.49, therapeutic x 3 12/15 0623 HL 0.43, therapeutic x 4   Plan:  Will continue heparin  infusion at 750 units/hr. Recheck heparin  level  daily w/ AM labs while therapeutic. CBC daily while on heparin .   Rankin [name], PharmD, [facility] 11/22/2024 7:02 AM

## 2024-11-23 LAB — GLUCOSE, CAPILLARY
Glucose-Capillary: 124 mg/dL — ABNORMAL HIGH (ref 70–99)
Glucose-Capillary: 150 mg/dL — ABNORMAL HIGH (ref 70–99)
Glucose-Capillary: 214 mg/dL — ABNORMAL HIGH (ref 70–99)
Glucose-Capillary: 236 mg/dL — ABNORMAL HIGH (ref 70–99)

## 2024-11-23 LAB — CBC
HCT: 28.4 % — ABNORMAL LOW (ref 36.0–46.0)
Hemoglobin: 8.4 g/dL — ABNORMAL LOW (ref 12.0–15.0)
MCH: 25 pg — ABNORMAL LOW (ref 26.0–34.0)
MCHC: 29.6 g/dL — ABNORMAL LOW (ref 30.0–36.0)
MCV: 84.5 fL (ref 80.0–100.0)
Platelets: 380 K/uL (ref 150–400)
RBC: 3.36 MIL/uL — ABNORMAL LOW (ref 3.87–5.11)
RDW: 18.8 % — ABNORMAL HIGH (ref 11.5–15.5)
WBC: 9.9 K/uL (ref 4.0–10.5)
nRBC: 1.3 % — ABNORMAL HIGH (ref 0.0–0.2)

## 2024-11-23 LAB — BASIC METABOLIC PANEL WITH GFR
Anion gap: 9 (ref 5–15)
BUN: 37 mg/dL — ABNORMAL HIGH (ref 8–23)
CO2: 35 mmol/L — ABNORMAL HIGH (ref 22–32)
Calcium: 9.2 mg/dL (ref 8.9–10.3)
Chloride: 94 mmol/L — ABNORMAL LOW (ref 98–111)
Creatinine, Ser: 1.37 mg/dL — ABNORMAL HIGH (ref 0.44–1.00)
GFR, Estimated: 37 mL/min — ABNORMAL LOW (ref >60)
Glucose, Bld: 150 mg/dL — ABNORMAL HIGH (ref 70–99)
Potassium: 4.3 mmol/L (ref 3.5–5.1)
Sodium: 137 mmol/L (ref 135–145)

## 2024-11-23 LAB — HEPARIN LEVEL (UNFRACTIONATED): Heparin Unfractionated: 0.4 [IU]/mL (ref 0.30–0.70)

## 2024-11-23 MED ORDER — INSULIN ASPART 100 UNIT/ML IJ SOLN
0.0000 [IU] | Freq: Three times a day (TID) | INTRAMUSCULAR | Status: DC
  Administered 2024-11-24 (×2): 1 [IU] via SUBCUTANEOUS
  Administered 2024-11-24: 10:00:00 2 [IU] via SUBCUTANEOUS
  Administered 2024-11-25: 17:00:00 5 [IU] via SUBCUTANEOUS
  Administered 2024-11-25 – 2024-11-26 (×2): 3 [IU] via SUBCUTANEOUS
  Administered 2024-11-26 – 2024-11-28 (×5): 2 [IU] via SUBCUTANEOUS
  Administered 2024-11-29 (×2): 1 [IU] via SUBCUTANEOUS
  Administered 2024-11-30: 2 [IU] via SUBCUTANEOUS
  Administered 2024-11-30 – 2024-12-01 (×2): 3 [IU] via SUBCUTANEOUS
  Administered 2024-12-01: 1 [IU] via SUBCUTANEOUS
  Administered 2024-12-02: 3 [IU] via SUBCUTANEOUS
  Administered 2024-12-02: 1 [IU] via SUBCUTANEOUS
  Administered 2024-12-03: 2 [IU] via SUBCUTANEOUS
  Filled 2024-11-23 (×2): qty 3
  Filled 2024-11-23 (×2): qty 1
  Filled 2024-11-23 (×3): qty 2
  Filled 2024-11-23: qty 5
  Filled 2024-11-23: qty 2
  Filled 2024-11-23: qty 3
  Filled 2024-11-23: qty 1
  Filled 2024-11-23: qty 3
  Filled 2024-11-23 (×2): qty 2
  Filled 2024-11-23: qty 3
  Filled 2024-11-23: qty 1
  Filled 2024-11-23: qty 2
  Filled 2024-11-23 (×2): qty 1
  Filled 2024-11-23: qty 3
  Filled 2024-11-23: qty 2

## 2024-11-23 NOTE — Plan of Care (Signed)

## 2024-11-23 NOTE — TOC Progression Note (Signed)
 Transition of Care [facility]) - Progression Note    Patient Details  Name: Jenny [name] MRN: [id number] Date of Birth: 1933/05/11  Transition of Care [facility]) CM/SW Contact  Nathanael CHRISTELLA Ring, RN Phone Number: 11/23/2024, 9:13 AM  Clinical Narrative:     Patient is not medically ready for discharge.  Plan is still for discharge to [facility] when medically stable.   Expected Discharge Plan: Skilled Nursing Facility Barriers to Discharge: Continued Medical Work up               Expected Discharge Plan and Services     Post Acute Care Choice: Skilled Nursing Facility Living arrangements for the past 2 months: Skilled Nursing Facility, Single Family Home                                       Social Drivers of Health (SDOH) Interventions SDOH Screenings   Food Insecurity: No Food Insecurity (11/15/2024)  Housing: Low Risk (11/15/2024)  Transportation Needs: No Transportation Needs (11/15/2024)  Utilities: Not At Risk (11/15/2024)  Alcohol Screen: Low Risk (07/02/2024)  Depression (PHQ2-9): Low Risk (07/22/2022)  Financial Resource Strain: Low Risk (07/02/2024)  Social Connections: Socially Isolated (11/15/2024)  Tobacco Use: Medium Risk (11/15/2024)    Readmission Risk Interventions    07/05/2024   10:24 AM  Readmission Risk Prevention Plan  Transportation Screening Complete  HRI or Home Care Consult Complete  Social Work Consult for Recovery Care Planning/Counseling Complete  Palliative Care Screening Not Applicable  Medication Review Oceanographer) Complete

## 2024-11-23 NOTE — Progress Notes (Signed)
 Progress Note    11/23/2024 9:31 AM 6 Days Post-Op  Subjective:  Subjective:   Jenny [name] is an 89 yo female who is now POD #6 from:   Date of Surgery: 11/17/2024   Surgeon(s):DEW,JASON     Assistants:none   Pre-operative Diagnosis: PAD with ulceration of the right lower extremity   Post-operative diagnosis:  Same   Procedure(s) Performed:             1.  Ultrasound guidance for vascular access left femoral artery             2.  Catheter placement into right common femoral artery from left femoral approach             3.  Aortogram and selective right lower extremity angiogram             4.  Percutaneous transluminal angioplasty of right distal SFA with 5 mm diameter Lutonix drug-coated angioplasty balloon             5.  Stent placement to the right distal SFA with 6 mm diameter by 10 cm length life stent             6.  Stent placement to the right external iliac artery with 8 mm diameter by 8 cm length life star stent             7.  Celt closure device left femoral artery   EBL: 5 cc   Contrast: 50 cc   Fluoro Time: 5.3 minutes   Patient is seen comfortably in bed this morning.  She endorses that her left leg feels much better than it did yesterday.  She endorses she continues to have the same pain and problems with her right leg.  She is insisting that we investigate and evaluate her right lower extremity pain.  Patient continues to be SOB due to Flu. Endorses she can only lay flat for about 2-3 minutes right now today. No complaints overnight vitals all remained stable.   Vitals:   11/23/24 0411 11/23/24 0751  BP: (!) 165/86 (!) 142/127  Pulse: 61 92  Resp: 19 20  Temp: 97.6 F (36.4 C) (!) 97 F (36.1 C)  SpO2: 100%    Physical Exam: Cardiac:  RRR, Normal S1, S2. No murmurs. Lungs:  Positive labored breathing due to her COPD with 2 liters [place] oxygen. Positive Rhonchi throughout. No rales but diminished breath sounds in the bases.  Incisions: Left Femoral  Artery Puncture site with dressing clean dry and intact. No S&S of hematoma, seroma or infection to note.  Extremities:  All extremities are warm to touch with palpable pulses in upper extremities. Lower extremities are bandaged but assessment of toes being purple. Only able to get doppler DP/PT pulses and they are weak  Abdomen:  Positive bowel sounds throughout, morbid obesity, soft non tender  Neurologic: AAOX3 answers all questions and follows commands.   CBC    Component Value Date/Time   WBC 9.9 11/23/2024 0541   RBC 3.36 (L) 11/23/2024 0541   HGB 8.4 (L) 11/23/2024 0541   HCT 28.4 (L) 11/23/2024 0541   PLT 380 11/23/2024 0541   MCV 84.5 11/23/2024 0541   MCH 25.0 (L) 11/23/2024 0541   MCHC 29.6 (L) 11/23/2024 0541   RDW 18.8 (H) 11/23/2024 0541   LYMPHSABS 0.3 (L) 11/14/2024 0022   MONOABS 0.7 11/14/2024 0022   EOSABS 0.0 11/14/2024 0022   BASOSABS 0.0 11/14/2024 0022    BMET  Component Value Date/Time   NA 137 11/23/2024 0541   NA 139 10/21/2024 1046   K 4.3 11/23/2024 0541   CL 94 (L) 11/23/2024 0541   CO2 35 (H) 11/23/2024 0541   GLUCOSE 150 (H) 11/23/2024 0541   BUN 37 (H) 11/23/2024 0541   BUN 48 (H) 10/21/2024 1046   CREATININE 1.37 (H) 11/23/2024 0541   CALCIUM  9.2 11/23/2024 0541   GFRNONAA 37 (L) 11/23/2024 0541   GFRAA  10/09/2009 0350    >60        The eGFR has been calculated using the MDRD equation. This calculation has not been validated in all clinical situations. eGFR's persistently <60 mL/min signify possible Chronic Kidney Disease.    INR    Component Value Date/Time   INR 1.3 (H) 11/14/2024 0354     Intake/Output Summary (Last 24 hours) at 11/23/2024 0931 Last data filed at 11/22/2024 2046 Gross per 24 hour  Intake 447.46 ml  Output 800 ml  Net -352.54 ml     Assessment/Plan:  89 y.o. female is s/p SEE ABOVE  6 Days Post-Op   PLAN Vascular surgery plans to take the patient to the vascular lab to complete the right lower  extremity angiogram with possible intervention on Thursday, 11/25/2024.  I notified the patient at the bedside this morning and we discussed the procedure again as well as the risks benefits and complications.  She verbalized understanding wishes to proceed. We will continue to monitor her breathing.    DVT prophylaxis:  Heparin  Infusion   Jenny [name] [facility] 11/23/2024 9:31 AM

## 2024-11-23 NOTE — Progress Notes (Signed)
 PHARMACY - ANTICOAGULATION CONSULT NOTE  Pharmacy Consult for IV Heparin   Indication: pulmonary embolus  Patient Measurements: Height: 5' 2 (157.5 cm) Weight: (!) 197.4 kg (435 lb 3 oz) IBW/kg (Calculated) : 50.1 HEPARIN  DW (KG): 71.4  Labs: Recent Labs    11/21/24 0617 11/22/24 0623 11/23/24 0541  HGB 9.1* 8.2* 8.4*  HCT 31.4* 28.4* 28.4*  PLT 353 346 380  HEPARINUNFRC 0.49 0.43 0.40  CREATININE 1.29* 1.31* 1.37*   Estimated Creatinine Clearance: 47.9 mL/min (A) (by C-G formula based on SCr of 1.37 mg/dL (H)).  Medical History: Past Medical History:  Diagnosis Date   ACUT DUOD ULCER W/HEMORR&PERF W/O MENTION OBST 10/05/2009   NSAID induced   ALLERGIC RHINITIS CAUSE UNSPECIFIED    ANEMIA-NOS    CAD (coronary artery disease) 06/08/2009   DEs RCA with 70% LAD and EF 60%   CHF (congestive heart failure) (HCC)    COPD    mild obst on PFTs 03/2010   Diabetes mellitus 06/2010 dx   Mild, diet controlled   GLAUCOMA    HYPERLIPIDEMIA    HYPERTENSION, BENIGN    MYOCARDIAL INFARCTION 06/08/2009   des to rca   Persistent atrial fibrillation (HCC)    Dx 08/2021   TOBACCO ABUSE    Assessment: Pharmacy consulted to dose heparin  in this 89 year old female admitted with PE.  Pt was on Eliquis  2.5 mg PO BID PTA, last dose on 12/6 @ 2100.  Goal of Therapy:  Heparin  level 0.3-0.7 units/ml aPTT 66 - 102  seconds Monitor platelets by anticoagulation protocol: Yes   12/9 1623 aPTT 121s, supratherapeutic @ 1100 u/hr 12/10 0155 aPTT 117s, supratherapeutic / HL 1.01 12/10 1052 aPTT 69s, therapeutic x 1 12/10 2223 aPTT 65, HL 0.73.  12/11 0421 aPTT 81 12/11 1148 aPTT 69 12/11 1922 aPTT 101, thera x 1; 950 un/h  12/12 0406 aPTT 137, HL 0.69 12/12 1231 HL 0.82 12/12 2257 HL 0.47, therapeutic x 1 12/13 0544 HL 0.47, therapeutic x 2 12/14 0617 HL 0.49, therapeutic x 3 12/15 0623 HL 0.43, therapeutic x 4 12/16 0541 HL 0.40, therapeutic x 5   Plan:  Will continue heparin  infusion  at 750 units/hr. Recheck heparin  level daily w/ AM labs while therapeutic. CBC daily while on heparin .   Moncia Annas D 11/23/2024 6:31 AM

## 2024-11-23 NOTE — Progress Notes (Signed)
°   11/23/24 1545  Spiritual Encounters  Type of Visit Initial  Care provided to: Patient  Referral source Family  Reason for visit Routine spiritual support  OnCall Visit Yes  Interventions  Spiritual Care Interventions Made Established relationship of care and support;Compassionate presence;Reflective listening;Narrative/life review;Reconciliation with self/others   Patient's son requested Chaplain visit.  Chaplain provided compassionate presence and compassionate listening. Talked about her family and her community, how she is feeling, and her previous hospital and rehab stays.  She voiced that she thinks she may die, and is not yet at peace with that.  She would like to face time with her son in [place] , and would like to see her grandson.  It would lift her spirits to have a chaplain visit each day while she's in the hospital.

## 2024-11-23 NOTE — Progress Notes (Signed)
 PROGRESS NOTE   Jenny [name]   FMW:[id number] DOB: 02-12-1933  DOA: 11/14/2024 Date of Service: 11/23/2024 which is hospital day 9  PCP: [facility] Ps course / significant events:   HPI: Jenny [name] is a 89 y.o. female with medical history significant for HFpEF, hypertension, persistent A-fib on Eliquis , tachybradycardia syndrome, CAD, COPD, CKD stage 3b, type 2 diabetes, anemia. Presents to ED from [facility] (rehab), 3 days LE edema and R leg pain 1 day, also SOB  12/07: to ED early hours AM. CTA chest showed tiny segmental PE left lower lobe without heart strain, extensive multivessel CAD and mild asymmetric pulmonary edema around the left hilum. Initially was treated with DuoNebs and Solu-Medrol . She initially received LR boluses but subsequently given a dose of Lasix  as test results came in. Started on vancomycin  and Rocephin  and also on a heparin  infusion for PE. Admitted with question sepsis d/t lower extremity cellulitis, vs non-infectious SIRS multifactorial including acute dyspnea secondary to COPD and CHF exacerbation. Podiatry consulted re: venous stasis ulcers on feet - bedisde debridement, appear superficial and recs for daily dressing changes, ACE wraps to knee, po abx on dc, WBAT, f/u outpatient 1 week after discharge   12/08: Vascular surgery consult d/t decreased ABI / PE> No thrombectomy given small PE. RLE angio planned for 12/10 12/09: stable 12/10: LLE angio and stents today. Cr 1.99 12/11: renal fxn improving Cr 1.56. Vascular surgery plans on taking the patient to the vascular lab on Monday, 11/22/2024 for RLE angiogram - pt to remain on heparin  infusion until after procedure  12/12: doing well / stable.  12/13: VERY short of breath w/ minimal exertion but improves quickly w/ rest. Changed nebs, will give 1 dose IV lasix  today  12/14: good UOP w/ IV lasix , increased po dose today. Still quite SOB, wheezing. (+)influenza A  --> tamiflu , postponing angio 12/15-12/16: continue tx COPD exacerbation d/t influenza. Tentative reschedule angio for 12/18     Consultants:  Podiatry Vascular Surgery  Palliative Care   Procedures/Surgeries: RLE angiogram and stents 11/17/24       ASSESSMENT & PLAN:   LE edema / erythema  Multifactorial, include: Venous stasis dermatitis / ulceration  Cellulitis  Peripheral arterial disease LE edema d/t CHF / HFpEF exacerbation  Treat underlying cause(s) as noted   Dyspnea, multifactorial COPD likely exacerbated d/t influenza A infection  CHF appears stable/euvolemic now post-treatment  Significant wheezing on exam minimal atelectasis/rales crackles  [name] O2 Tx Flu, COPD, and CHF as noted below  COPD  Chronic hypoxic respiratory failure on 2L [place] O2 at baseline  Exacerbated now d/t influenza A Arrives w/ prednisone  20 on med list, says has been maintained on that for a couple of months prednisone  to 10 daily --> burst 40 mg daily x5 days given new exacerbation, then ordered back to 10 mg daily to restart Saturday  O2 supplementation Tamiflu  per pharmacy renal dose  Adding schedule nebs --> budesonide  bid, duoneb q6h while awake Albuterol  prn  will need close outpt pulm f/u  Venous stasis  Venous stasis ulcers lower extremities bilaterally Question Cellulitis daily dressing changes, ACE wraps to knee Continue keflex  for 7-10d total  WBAT f/u podiatry outpatient 1 week after discharge  Peripheral arterial disease  S/p angiogram RLE w/ stenting 11/17/24 Vascular surgery following Tentative reschedule angio for 12/18 - held d/t new flu infection    Acute PE Very small.  On apixaban  2.5 bid  at baseline, patient unsure if she's been receiving regularly at snf. Given cr under 1.5 assumes stays there would warrant full dose of 5 bid heparin  for now pending vascular angio See pharmacy note 12/11 for eliquis  - test claim for eliquis  5 mg bis $85  HFpEF  with acute exacerbation - now stabilized  Recent hospitalization with dose reduction in diuretic, likely under-diuresed (discharged on lasix  40, had previously been on bid dosing). Cr up from admission after IV diuresis, but stable lasix  40 mg oral daily--> bid  Monitor I&O Monitor BMP    Hyperkalemia - resolved  K 5.3 treated with lokelma  improved Monitor BMP   AKI on ckd 3b - resolved  Cr 1.39 on admission --> peak at 2.09 on 12/08 --> 11/21/2024 1.29 resuming oral lasix , would consider hold if kidney function worsens again Monitor BMP   History tachy-brady A-fib Rate controlled currently heparin  as above Resume eliquis  on discharge as above - See pharmacy note 12/11 for eliquis  - test claim for eliquis  5 mg bis $85   Elevated troponin, ACS ruled out  Mild, stable, flat, likely demand as no chest pain Recheck troponin / EKG prn chest pain.    Neuropathy home gabapentin    Pulm nodule outpt f/u   Hypokalemia Replace as needed Monitor BMP, Mg   Debility Comes from rehab PT consulted patient does not want to return to [facility]. TOC aware, looking elsewhere but if refusing available facility then will have to DC to home instead if unable to arrange alternative placement     Class 2 / borderline Class 3 obesity based on BMI: Body mass index is 38.1 kg/m.[name] Significantly low or high BMI is associated with higher medical risk.  Underweight - under 18  overweight - 25 to 29 obese - 30 or more Class 1 obesity: BMI of 30.0 to 34 Class 2 obesity: BMI of 35.0 to 39 Class 3 obesity: BMI of 40.0 to 49 Super Morbid Obesity: BMI 50-59 Super-super Morbid Obesity: BMI 60+ Healthy nutrition and physical activity advised as adjunct to other disease management and risk reduction treatments    DVT prophylaxis: heparin  IV fluids: no continuous IV fluids  Nutrition: cardiac diet  Central lines / other devices: none  Code Status: DNR ACP documentation reviewed:  none  on file in [facility]  TOC needs: SNF rehab placement Medical barriers to dispo:  Expected medical readiness for discharge pending angio which was delayed d/t influenza infection        Subjective / Brief ROS:  Still significant SOB Pain controlled.  Denies new weakness.  Tolerating diet.     Family Communication: call to son Ozell 11/23/2024 4:46 PM       Objective Findings:   Vitals:   11/23/24 0407 11/23/24 0411 11/23/24 0751 11/23/24 1548  BP:  (!) 165/86 (!) 142/127 117/62  Pulse:  61 92 75  Resp:  19 20 (!) 24  Temp:  97.6 F (36.4 C) (!) 97 F (36.1 C) 97.7 F (36.5 C)  TempSrc:  Oral    SpO2:  100%  100%  Weight: (!) 197.4 kg     Height:        Intake/Output Summary (Last 24 hours) at 11/23/2024 1646 Last data filed at 11/22/2024 2046 Gross per 24 hour  Intake 447.46 ml  Output 800 ml  Net -352.54 ml   Filed Weights   11/20/24 0500 11/22/24 0556 11/23/24 0407  Weight: 89.5 kg 88 kg (!) 197.4 kg  Physical Exam Constitutional:      General: She is not in acute distress. Cardiovascular:     Rate and Rhythm: Normal rate and regular rhythm.  Pulmonary:     Effort: Tachypnea present. No respiratory distress.     Breath sounds: Decreased air movement present. Wheezing (some improved from yesterday) present.  Skin:    General: Skin is warm and dry.  Neurological:     Mental Status: She is alert.  Psychiatric:        Mood and Affect: Mood normal.        Behavior: Behavior normal.          Scheduled Medications:   budesonide  (PULMICORT ) nebulizer solution  0.25 mg Nebulization BID   cephALEXin   500 mg Oral Q8H   dorzolamide   1 drop Both Eyes BID   furosemide   40 mg Oral BID WC   gabapentin   100 mg Oral Daily   guaiFENesin   600 mg Oral BID   hydrocerin   Topical BID   ipratropium-albuterol   3 mL Nebulization TID   latanoprost   1 drop Both Eyes QHS   oseltamivir   30 mg Oral BID   pantoprazole   40 mg Oral Daily   polyethylene  glycol  17 g Oral Daily   [START ON 11/27/2024] predniSONE   10 mg Oral Q breakfast   predniSONE   40 mg Oral Q breakfast   rosuvastatin   20 mg Oral Daily    Continuous Infusions:  heparin  750 Units/hr (11/22/24 2046)    PRN Medications:  acetaminophen  **OR** acetaminophen , albuterol , hydrOXYzine , ondansetron  **OR** ondansetron  (ZOFRAN ) IV, mouth rinse, oxyCODONE , sodium phosphate   Antimicrobials from admission:  Anti-infectives (From admission, onward)    Start     Dose/Rate Route Frequency Ordered Stop   11/21/24 2200  oseltamivir  (TAMIFLU ) capsule 30 mg       Placed in Followed by Linked Group   30 mg Oral 2 times daily 11/21/24 1240 11/26/24 0959   11/21/24 1330  oseltamivir  (TAMIFLU ) capsule 75 mg       Placed in Followed by Linked Group   75 mg Oral  Once 11/21/24 1240 11/21/24 1434   11/17/24 1112  ceFAZolin  (ANCEF ) IVPB 2g/100 mL premix        2 g 200 mL/hr over 30 Minutes Intravenous 30 min pre-op 11/17/24 1112 11/18/24 1430   11/16/24 1400  cephALEXin  (KEFLEX ) capsule 500 mg        500 mg Oral Every 8 hours 11/16/24 1157 11/25/24 2359   11/16/24 0200  vancomycin  (VANCOREADY) IVPB 1250 mg/250 mL  Status:  Discontinued        1,250 mg 166.7 mL/hr over 90 Minutes Intravenous Every 48 hours 11/14/24 0522 11/14/24 0901   11/15/24 0130  cefTRIAXone  (ROCEPHIN ) 1 g in sodium chloride  0.9 % 100 mL IVPB  Status:  Discontinued        1 g 200 mL/hr over 30 Minutes Intravenous Every 24 hours 11/14/24 0448 11/14/24 0901   11/14/24 2200  ceFAZolin  (ANCEF ) IVPB 1 g/50 mL premix  Status:  Discontinued        1 g 100 mL/hr over 30 Minutes Intravenous Every 8 hours 11/14/24 0920 11/16/24 1157   11/14/24 1400  ceFAZolin  (ANCEF ) IVPB 1 g/50 mL premix  Status:  Discontinued        1 g 100 mL/hr over 30 Minutes Intravenous Every 8 hours 11/14/24 0919 11/14/24 0920   11/14/24 0500  vancomycin  (VANCOREADY) IVPB 1500 mg/300 mL  1,500 mg 150 mL/hr over 120 Minutes Intravenous  Once  11/14/24 0457 11/14/24 0805   11/14/24 0115  cefTRIAXone  (ROCEPHIN ) 2 g in sodium chloride  0.9 % 100 mL IVPB        2 g 200 mL/hr over 30 Minutes Intravenous Once 11/14/24 0108 11/14/24 0201   11/14/24 0115  vancomycin  (VANCOCIN ) IVPB 1000 mg/200 mL premix        1,000 mg 200 mL/hr over 60 Minutes Intravenous  Once 11/14/24 0108 11/14/24 0319           Data Reviewed:  I have personally reviewed the following...  CBC: Recent Labs  Lab 11/19/24 0406 11/20/24 0544 11/21/24 0617 11/22/24 0623 11/23/24 0541  WBC 7.4 6.5 7.3 7.1 9.9  HGB 8.5* 8.5* 9.1* 8.2* 8.4*  HCT 29.0* 29.3* 31.4* 28.4* 28.4*  MCV 86.3 86.7 86.5 85.8 84.5  PLT 317 321 353 346 380   Basic Metabolic Panel: Recent Labs  Lab 11/18/24 0421 11/19/24 0406 11/21/24 0617 11/22/24 0623 11/23/24 0541  NA 140 139 142 139 137  K 4.4 4.1 4.0 3.2* 4.3  CL 99 99 97* 95* 94*  CO2 32 30 35* 37* 35*  GLUCOSE 92 121* 133* 133* 150*  BUN 43* 42* 35* 32* 37*  CREATININE 1.56* 1.56* 1.29* 1.31* 1.37*  CALCIUM  8.3* 8.8* 8.9 8.8* 9.2   GFR: Estimated Creatinine Clearance: 47.9 mL/min (A) (by C-G formula based on SCr of 1.37 mg/dL (H)). Liver Function Tests: No results for input(s): AST, ALT, ALKPHOS, BILITOT, PROT, ALBUMIN in the last 168 hours.  No results for input(s): LIPASE, AMYLASE in the last 168 hours. No results for input(s): AMMONIA in the last 168 hours. Coagulation Profile: No results for input(s): INR, PROTIME in the last 168 hours.  Cardiac Enzymes: No results for input(s): CKTOTAL, CKMB, CKMBINDEX, TROPONINI in the last 168 hours. BNP (last 3 results) Recent Labs    11/14/24 0022  PROBNP 2,277.0*   HbA1C: No results for input(s): HGBA1C in the last 72 hours. CBG: Recent Labs  Lab 11/22/24 1603 11/22/24 2010 11/23/24 0754 11/23/24 1134 11/23/24 1549  GLUCAP 270* 205* 124* 150* 214*   Lipid Profile: No results for input(s): CHOL, HDL, LDLCALC,  TRIG, CHOLHDL, LDLDIRECT in the last 72 hours. Thyroid  Function Tests: No results for input(s): TSH, T4TOTAL, FREET4, T3FREE, THYROIDAB in the last 72 hours. Anemia Panel: No results for input(s): VITAMINB12, FOLATE, FERRITIN, TIBC, IRON , RETICCTPCT in the last 72 hours. Most Recent Urinalysis On File:     Component Value Date/Time   COLORURINE YELLOW 10/06/2024 2103   APPEARANCEUR CLEAR 10/06/2024 2103   LABSPEC 1.008 10/06/2024 2103   PHURINE 6.0 10/06/2024 2103   GLUCOSEU NEGATIVE 10/06/2024 2103   HGBUR MODERATE (A) 10/06/2024 2103   BILIRUBINUR NEGATIVE 10/06/2024 2103   KETONESUR NEGATIVE 10/06/2024 2103   PROTEINUR NEGATIVE 10/06/2024 2103   NITRITE NEGATIVE 10/06/2024 2103   LEUKOCYTESUR NEGATIVE 10/06/2024 2103   Sepsis Labs: @LABRCNTIP (procalcitonin:4,lacticidven:4) Microbiology: Recent Results (from the past 240 hours)  Blood culture (routine x 2)     Status: None   Collection Time: 11/14/24  1:22 AM   Specimen: BLOOD  Result Value Ref Range Status   Specimen Description BLOOD RIGHT ANTECUBITAL  Final   Special Requests   Final    BOTTLES DRAWN AEROBIC AND ANAEROBIC Blood Culture results may not be optimal due to an inadequate volume of blood received in culture bottles   Culture   Final    NO GROWTH 5 DAYS Performed at  [facility], [address], [place], [place] [address]    Report Status 11/19/2024 FINAL  Final  Blood culture (routine x 2)     Status: None   Collection Time: 11/14/24  1:22 AM   Specimen: BLOOD  Result Value Ref Range Status   Specimen Description BLOOD BLOOD LEFT FOREARM  Final   Special Requests   Final    BOTTLES DRAWN AEROBIC AND ANAEROBIC Blood Culture adequate volume   Culture   Final    NO GROWTH 5 DAYS Performed at [facility], [address] [place], [place] [address]    Report Status 11/19/2024 FINAL  Final  Resp panel by RT-PCR (RSV, Flu A&B, Covid) Anterior Nasal Swab      Status: None   Collection Time: 11/14/24  1:22 AM   Specimen: Anterior Nasal Swab  Result Value Ref Range Status   SARS Coronavirus 2 by RT PCR NEGATIVE NEGATIVE Final    Comment: (NOTE) SARS-CoV-2 target nucleic acids are NOT DETECTED.  The SARS-CoV-2 RNA is generally detectable in upper respiratory specimens during the acute phase of infection. The lowest concentration of SARS-CoV-2 viral copies this assay can detect is 138 copies/mL. A negative result does not preclude SARS-Cov-2 infection and should not be used as the sole basis for treatment or other patient management decisions. A negative result may occur with  improper specimen collection/handling, submission of specimen other than nasopharyngeal swab, presence of viral mutation(s) within the areas targeted by this assay, and inadequate number of viral copies(<138 copies/mL). A negative result must be combined with clinical observations, patient history, and epidemiological information. The expected result is Negative.  Fact Sheet for Patients:  bloggercourse.com  Fact Sheet for Healthcare Providers:  seriousbroker.it  This test is no t yet approved or cleared by the [place]  FDA and  has been authorized for detection and/or diagnosis of SARS-CoV-2 by FDA under an Emergency Use Authorization (EUA). This EUA will remain  in effect (meaning this test can be used) for the duration of the COVID-19 declaration under Section 564(b)(1) of the Act, 21 U.S.C.section 360bbb-3(b)(1), unless the authorization is terminated  or revoked sooner.       Influenza A by PCR NEGATIVE NEGATIVE Final   Influenza B by PCR NEGATIVE NEGATIVE Final    Comment: (NOTE) The Xpert Xpress SARS-CoV-2/FLU/RSV plus assay is intended as an aid in the diagnosis of influenza from Nasopharyngeal swab specimens and should not be used as a sole basis for treatment. Nasal washings and aspirates are  unacceptable for Xpert Xpress SARS-CoV-2/FLU/RSV testing.  Fact Sheet for Patients: bloggercourse.com  Fact Sheet for Healthcare Providers: seriousbroker.it  This test is not yet approved or cleared by the [place]  FDA and has been authorized for detection and/or diagnosis of SARS-CoV-2 by FDA under an Emergency Use Authorization (EUA). This EUA will remain in effect (meaning this test can be used) for the duration of the COVID-19 declaration under Section 564(b)(1) of the Act, 21 U.S.C. section 360bbb-3(b)(1), unless the authorization is terminated or revoked.     Resp Syncytial Virus by PCR NEGATIVE NEGATIVE Final    Comment: (NOTE) Fact Sheet for Patients: bloggercourse.com  Fact Sheet for Healthcare Providers: seriousbroker.it  This test is not yet approved or cleared by the [place]  FDA and has been authorized for detection and/or diagnosis of SARS-CoV-2 by FDA under an Emergency Use Authorization (EUA). This EUA will remain in effect (meaning this test can be used) for the duration of the COVID-19  declaration under Section 564(b)(1) of the Act, 21 U.S.C. section 360bbb-3(b)(1), unless the authorization is terminated or revoked.  Performed at [facility], [address] [place], [place] [address]   Resp panel by RT-PCR (RSV, Flu A&B, Covid) Anterior Nasal Swab     Status: Abnormal   Collection Time: 11/21/24 11:27 AM   Specimen: Anterior Nasal Swab  Result Value Ref Range Status   SARS Coronavirus 2 by RT PCR NEGATIVE NEGATIVE Final    Comment: (NOTE) SARS-CoV-2 target nucleic acids are NOT DETECTED.  The SARS-CoV-2 RNA is generally detectable in upper respiratory specimens during the acute phase of infection. The lowest concentration of SARS-CoV-2 viral copies this assay can detect is 138 copies/mL. A negative result does not preclude  SARS-Cov-2 infection and should not be used as the sole basis for treatment or other patient management decisions. A negative result may occur with  improper specimen collection/handling, submission of specimen other than nasopharyngeal swab, presence of viral mutation(s) within the areas targeted by this assay, and inadequate number of viral copies(<138 copies/mL). A negative result must be combined with clinical observations, patient history, and epidemiological information. The expected result is Negative.  Fact Sheet for Patients:  bloggercourse.com  Fact Sheet for Healthcare Providers:  seriousbroker.it  This test is no t yet approved or cleared by the [place]  FDA and  has been authorized for detection and/or diagnosis of SARS-CoV-2 by FDA under an Emergency Use Authorization (EUA). This EUA will remain  in effect (meaning this test can be used) for the duration of the COVID-19 declaration under Section 564(b)(1) of the Act, 21 U.S.C.section 360bbb-3(b)(1), unless the authorization is terminated  or revoked sooner.       Influenza A by PCR POSITIVE (A) NEGATIVE Final   Influenza B by PCR NEGATIVE NEGATIVE Final    Comment: (NOTE) The Xpert Xpress SARS-CoV-2/FLU/RSV plus assay is intended as an aid in the diagnosis of influenza from Nasopharyngeal swab specimens and should not be used as a sole basis for treatment. Nasal washings and aspirates are unacceptable for Xpert Xpress SARS-CoV-2/FLU/RSV testing.  Fact Sheet for Patients: bloggercourse.com  Fact Sheet for Healthcare Providers: seriousbroker.it  This test is not yet approved or cleared by the [place]  FDA and has been authorized for detection and/or diagnosis of SARS-CoV-2 by FDA under an Emergency Use Authorization (EUA). This EUA will remain in effect (meaning this test can be used) for the duration of  the COVID-19 declaration under Section 564(b)(1) of the Act, 21 U.S.C. section 360bbb-3(b)(1), unless the authorization is terminated or revoked.     Resp Syncytial Virus by PCR NEGATIVE NEGATIVE Final    Comment: (NOTE) Fact Sheet for Patients: bloggercourse.com  Fact Sheet for Healthcare Providers: seriousbroker.it  This test is not yet approved or cleared by the [place]  FDA and has been authorized for detection and/or diagnosis of SARS-CoV-2 by FDA under an Emergency Use Authorization (EUA). This EUA will remain in effect (meaning this test can be used) for the duration of the COVID-19 declaration under Section 564(b)(1) of the Act, 21 U.S.C. section 360bbb-3(b)(1), unless the authorization is terminated or revoked.  Performed at [facility], [address] [place], [place] [address]   Respiratory (~20 pathogens) panel by PCR     Status: Abnormal   Collection Time: 11/21/24 11:27 AM   Specimen: Nasopharyngeal Swab; Respiratory  Result Value Ref Range Status   Adenovirus NOT DETECTED NOT DETECTED Final   Coronavirus 229E NOT DETECTED NOT DETECTED  Final    Comment: (NOTE) The Coronavirus on the Respiratory Panel, DOES NOT test for the novel  Coronavirus (2019 nCoV)    Coronavirus HKU1 NOT DETECTED NOT DETECTED Final   Coronavirus NL63 NOT DETECTED NOT DETECTED Final   Coronavirus OC43 NOT DETECTED NOT DETECTED Final   Metapneumovirus NOT DETECTED NOT DETECTED Final   Rhinovirus / Enterovirus NOT DETECTED NOT DETECTED Final   Influenza A H3 DETECTED (A) NOT DETECTED Final   Influenza B NOT DETECTED NOT DETECTED Final   Parainfluenza Virus 1 NOT DETECTED NOT DETECTED Final   Parainfluenza Virus 2 NOT DETECTED NOT DETECTED Final   Parainfluenza Virus 3 NOT DETECTED NOT DETECTED Final   Parainfluenza Virus 4 NOT DETECTED NOT DETECTED Final   Respiratory Syncytial Virus NOT DETECTED NOT DETECTED Final    Bordetella pertussis NOT DETECTED NOT DETECTED Final   Bordetella Parapertussis NOT DETECTED NOT DETECTED Final   Chlamydophila pneumoniae NOT DETECTED NOT DETECTED Final   Mycoplasma pneumoniae NOT DETECTED NOT DETECTED Final    Comment: Performed at [facility], [address] [place], [place] [address]      Radiology Studies last 3 days: [facility] Chest Port 1 View Result Date: 11/20/2024 CLINICAL DATA:  [id number] SOB (shortness of breath) [id number] EXAM: PORTABLE CHEST 1 VIEW COMPARISON:  November 14, 2024, July 01, 2024 FINDINGS: The cardiomediastinal silhouette is unchanged in contour.Atherosclerotic calcifications. RIGHT upper lobe pulmonary nodule, better assessed on recent CT. No pleural effusion. No pneumothorax. No acute pleuroparenchymal abnormality. IMPRESSION: 1. No acute cardiopulmonary abnormality. 2. RIGHT upper lobe pulmonary nodule, better assessed on recent CT. Recommend management as outlined on December 7th CT. Electronically Signed   By: Corean Salter M.D.   On: 11/20/2024 11:25         Camryn Lampson, DO [facility] 11/23/2024, 4:46 PM    Dictation software may have been used to generate the above note. Typos may occur and escape review in typed/dictated notes. Please contact Dr Marsa directly for clarity if needed.  Staff may message me via secure chat in Epic  but this may not receive an immediate response,  please page me for urgent matters!  If 7PM-7AM, please contact night coverage [email or web address]

## 2024-11-24 DIAGNOSIS — J101 Influenza due to other identified influenza virus with other respiratory manifestations: Secondary | ICD-10-CM

## 2024-11-24 DIAGNOSIS — L03115 Cellulitis of right lower limb: Secondary | ICD-10-CM | POA: Diagnosis not present

## 2024-11-24 DIAGNOSIS — J440 Chronic obstructive pulmonary disease with acute lower respiratory infection: Secondary | ICD-10-CM

## 2024-11-24 DIAGNOSIS — L03116 Cellulitis of left lower limb: Secondary | ICD-10-CM | POA: Diagnosis not present

## 2024-11-24 DIAGNOSIS — I110 Hypertensive heart disease with heart failure: Secondary | ICD-10-CM

## 2024-11-24 DIAGNOSIS — I509 Heart failure, unspecified: Secondary | ICD-10-CM

## 2024-11-24 LAB — CBC
HCT: 28 % — ABNORMAL LOW (ref 36.0–46.0)
Hemoglobin: 8.2 g/dL — ABNORMAL LOW (ref 12.0–15.0)
MCH: 25 pg — ABNORMAL LOW (ref 26.0–34.0)
MCHC: 29.3 g/dL — ABNORMAL LOW (ref 30.0–36.0)
MCV: 85.4 fL (ref 80.0–100.0)
Platelets: 392 K/uL (ref 150–400)
RBC: 3.28 MIL/uL — ABNORMAL LOW (ref 3.87–5.11)
RDW: 18.7 % — ABNORMAL HIGH (ref 11.5–15.5)
WBC: 11.6 K/uL — ABNORMAL HIGH (ref 4.0–10.5)
nRBC: 0.7 % — ABNORMAL HIGH (ref 0.0–0.2)

## 2024-11-24 LAB — BASIC METABOLIC PANEL WITH GFR
Anion gap: 9 (ref 5–15)
BUN: 39 mg/dL — ABNORMAL HIGH (ref 8–23)
CO2: 35 mmol/L — ABNORMAL HIGH (ref 22–32)
Calcium: 9.1 mg/dL (ref 8.9–10.3)
Chloride: 95 mmol/L — ABNORMAL LOW (ref 98–111)
Creatinine, Ser: 1.39 mg/dL — ABNORMAL HIGH (ref 0.44–1.00)
GFR, Estimated: 36 mL/min — ABNORMAL LOW (ref >60)
Glucose, Bld: 168 mg/dL — ABNORMAL HIGH (ref 70–99)
Potassium: 4.7 mmol/L (ref 3.5–5.1)
Sodium: 139 mmol/L (ref 135–145)

## 2024-11-24 LAB — HEPARIN LEVEL (UNFRACTIONATED): Heparin Unfractionated: 0.42 [IU]/mL (ref 0.30–0.70)

## 2024-11-24 LAB — GLUCOSE, CAPILLARY
Glucose-Capillary: 127 mg/dL — ABNORMAL HIGH (ref 70–99)
Glucose-Capillary: 145 mg/dL — ABNORMAL HIGH (ref 70–99)
Glucose-Capillary: 169 mg/dL — ABNORMAL HIGH (ref 70–99)
Glucose-Capillary: 190 mg/dL — ABNORMAL HIGH (ref 70–99)
Glucose-Capillary: 197 mg/dL — ABNORMAL HIGH (ref 70–99)

## 2024-11-24 MED ORDER — IPRATROPIUM-ALBUTEROL 0.5-2.5 (3) MG/3ML IN SOLN
3.0000 mL | Freq: Two times a day (BID) | RESPIRATORY_TRACT | Status: DC
  Administered 2024-11-24 – 2024-11-26 (×3): 3 mL via RESPIRATORY_TRACT
  Filled 2024-11-24 (×4): qty 3

## 2024-11-24 MED ORDER — OSELTAMIVIR PHOSPHATE 30 MG PO CAPS
30.0000 mg | ORAL_CAPSULE | Freq: Every day | ORAL | Status: AC
  Administered 2024-11-24 – 2024-11-25 (×2): 30 mg via ORAL
  Filled 2024-11-24 (×2): qty 1

## 2024-11-24 MED ORDER — FUROSEMIDE 40 MG PO TABS
40.0000 mg | ORAL_TABLET | ORAL | Status: DC
  Administered 2024-11-25 – 2024-11-27 (×2): 40 mg via ORAL
  Filled 2024-11-24 (×2): qty 1

## 2024-11-24 MED ORDER — NYSTATIN 100000 UNIT/GM EX POWD
Freq: Two times a day (BID) | CUTANEOUS | Status: DC
  Filled 2024-11-24 (×3): qty 15

## 2024-11-24 MED ORDER — BISACODYL 10 MG RE SUPP
10.0000 mg | Freq: Once | RECTAL | Status: AC
  Administered 2024-11-24: 10:00:00 10 mg via RECTAL
  Filled 2024-11-24: qty 1

## 2024-11-24 NOTE — Progress Notes (Signed)
 Progress Note   Patient: Jenny [name] FMW:[id number] DOB: May 06, 1933 DOA: 11/14/2024     10 DOS: the patient was seen and examined on 11/24/2024   Brief hospital course:  Jenny [name] is a 89 y.o. female with medical history significant for HFpEF, hypertension, persistent A-fib on Eliquis , tachybradycardia syndrome, CAD, COPD, CKD stage 3b, type 2 diabetes, anemia. Presents to ED from [facility] (rehab), 3 days LE edema and R leg pain 1 day, also SOB   12/07: to ED early hours AM. CTA chest showed tiny segmental PE left lower lobe without heart strain, extensive multivessel CAD and mild asymmetric pulmonary edema around the left hilum. Initially was treated with DuoNebs and Solu-Medrol . She initially received LR boluses but subsequently given a dose of Lasix  as test results came in. Started on vancomycin  and Rocephin  and also on a heparin  infusion for PE. Admitted with question sepsis d/t lower extremity cellulitis, vs non-infectious SIRS multifactorial including acute dyspnea secondary to COPD and CHF exacerbation. Podiatry consulted re: venous stasis ulcers on feet - bedisde debridement, appear superficial and recs for daily dressing changes, ACE wraps to knee, po abx on dc, WBAT, f/u outpatient 1 week after discharge   12/08: Vascular surgery consult d/t decreased ABI / PE> No thrombectomy given small PE. RLE angio planned for 12/10 12/09: stable 12/10: LLE angio and stents today. Cr 1.99 12/11: renal fxn improving Cr 1.56. Vascular surgery plans on taking the patient to the vascular lab on Monday, 11/22/2024 for RLE angiogram - pt to remain on heparin  infusion until after procedure  12/12: doing well / stable.  12/13: VERY short of breath w/ minimal exertion but improves quickly w/ rest. Changed nebs, will give 1 dose IV lasix  today  12/14: good UOP w/ IV lasix , increased po dose today. Still quite SOB, wheezing. (+)influenza A --> tamiflu , postponing angio 12/15-12/16: continue tx  COPD exacerbation d/t influenza. Tentative reschedule angio for 12/18 12/17 -still short of breath but improved.  Remains on 2 L of oxygen via nasal cannula.  Unable to lay flat      Assessment and Plan:  LE edema / erythema  Multifactorial, include: Venous stasis dermatitis / ulceration  Cellulitis  Peripheral arterial disease LE edema d/t CHF / HFpEF exacerbation  Treat underlying cause(s) as noted    Dyspnea, multifactorial COPD likely exacerbated d/t influenza A infection.  Remains on Tamiflu  to complete a 5-day course of therapy CHF appears stable/euvolemic now post-treatment.  Change Lasix  to every other day due to contraction alkalosis Continue bronchodilator therapy and inhaled steroids [place] O2 to maintain pulse oximetry greater than 92% Tx Flu, COPD, and CHF as noted below   COPD  Chronic hypoxic respiratory failure on 2L [place] O2 at baseline  Exacerbated now d/t influenza A Arrives w/ prednisone  20 on med list, says has been maintained on that for a couple of months prednisone  to 10 daily --> burst 40 mg daily x5 days given new exacerbation, then ordered back to 10 mg daily to restart Saturday  O2 supplementation Tamiflu  per pharmacy renal dose to complete a 5-day course of therapy Continue schedule nebs --> budesonide  bid, duoneb q6h while awake Albuterol  prn  will need close outpt pulm f/u   Venous stasis  Venous stasis ulcers lower extremities bilaterally Question Cellulitis daily dressing changes, ACE wraps to knee Continue keflex  for 7-10d total  WBAT f/u podiatry outpatient 1 week after discharge   Peripheral arterial disease  S/p angiogram RLE w/ stenting  11/17/24 Vascular surgery following Tentative reschedule angio for 12/18 - held d/t new flu infection    Acute PE Very small.  On apixaban  2.5 bid at baseline, patient unsure if she's been receiving regularly at snf. Given cr under 1.5 assumes stays there would warrant full dose of 5 bid heparin   for now pending vascular angio See pharmacy note 12/11 for eliquis  - test claim for eliquis  5 mg bis $85   HFpEF with acute exacerbation - now stabilized  Recent hospitalization with dose reduction in diuretic, likely under-diuresed (discharged on lasix  40, had previously been on bid dosing).  Cr up from admission after IV diuresis, but stable Change Lasix  to 40 mg every other day due to contraction alkalosis Monitor I&O Monitor BMP    Hyperkalemia - resolved  Monitor BMP   AKI on ckd 3b - resolved  Cr 1.39 on admission --> peak at 2.09 on 12/08 --> 11/21/2024 1.29 Renal function is stable Monitor closely   History tachy-brady A-fib Rate controlled currently heparin  as primary prophylaxis for an acute stroke while Eliquis  is on hold as above Resume eliquis  on discharge as above - See pharmacy note 12/11 for eliquis  - test claim for eliquis  5 mg bis $85   Elevated troponin, ACS ruled out  Mild, stable, flat, likely demand as no chest pain Recheck troponin / EKG prn chest pain.    Neuropathy home gabapentin    Pulm nodule outpt f/u       Debility Comes from rehab PT consulted patient does not want to return to [facility]. TOC aware, looking elsewhere but if refusing available facility then will have to DC to home instead if unable to arrange alternative placement        Class 2 / borderline Class 3 obesity based on BMI:  Body mass index is 35.32kg/m. Complicates overall prognosis and care      Subjective: Still short of breath but improved.  On 2 L of oxygen via nasal cannula  Physical Exam: Vitals:   11/24/24 0526 11/24/24 0527 11/24/24 0734 11/24/24 0850  BP: 130/60   (!) 134/94  Pulse: (!) 37 (!) 107  98  Resp: 19   20  Temp: 97.7 F (36.5 C)   98.4 F (36.9 C)  TempSrc: Oral     SpO2: 100% 100% 99% 98%  Weight:      Height:      General: Chronically ill-appearing, appears comfortable and in no distress HEENT : Pale conjunctiva, anicteric  sclera Cardiac:  RRR, Normal S1, S2. No murmurs. Lungs: Rales at the bases, scattered rhonchi Cardiovascular : RRR S1,S2  Abdomen:  Positive bowel sounds throughout, morbid obesity, soft non tender  Neurologic: AAOX3 answers all questions and follows commands.  Extremities:  All extremities are warm to touch with palpable pulses in upper extremities. Lower extremities are bandaged but assessment of toes being purple.    Data Reviewed: Labs reviewed.  Hemoglobin 8.2, white count 11.6, bicarb 35, BUN 39, creatinine 1.39 Labs reviewed  Family Communication: Plan of care discussed with patient at the bedside.  She verbalizes understanding and agrees to the plan  Disposition: Status is: Inpatient Remains inpatient appropriate because: For angiogram in a.m.  Planned Discharge Destination: Skilled nursing facility    Time spent: 50 minutes  Author: Aimee Somerset, MD 11/24/2024 2:33 PM  For on call review [email or web address].

## 2024-11-24 NOTE — Plan of Care (Signed)

## 2024-11-24 NOTE — Progress Notes (Signed)
 PHARMACY NOTE:  ANTIMICROBIAL RENAL DOSAGE ADJUSTMENT  Current antimicrobial regimen includes a mismatch between antimicrobial dosage and estimated renal function.  As per policy approved by the Pharmacy & Therapeutics and Medical Executive Committees, the antimicrobial dosage will be adjusted accordingly.  Current antimicrobial dosage:  oseltamivir  30mg  BID  Indication: Influenza   Renal Function:  Estimated Creatinine Clearance: 28.2 mL/min (A) (by C-G formula based on SCr of 1.39 mg/dL (H)).  Antimicrobial dosage has been changed to:  oseltamivir  30mg  daily   Thank you for allowing pharmacy to be a part of this patient's care.  Estill CHRISTELLA Lutes, PharmD, BCPS Clinical Pharmacist 11/24/2024 9:42 AM

## 2024-11-24 NOTE — Progress Notes (Signed)
 Occupational Therapy Treatment Patient Details Name: Jenny [name] MRN: [id number] DOB: Apr 18, 1933 Today's Date: 11/24/2024   History of present illness Pt is an 89 y/o F admitted on 11/14/24 after presenting with c/o BLE pain, SOB. Pt is being treated for BLE cellulitis, also found to have acute PE. Pt is s/p RLE angio & stents on 11/17/24. Pt tested + for Influenza A on 11/21/24. PMH: HFpEF, HTN, persistent a-fib on Eliquis , tachybradycardia syndrome, CAD, COPD, CKD 3B, DM2, anemia   OT comments  Upon entering the room, pt seated in recliner chair and reports increased fatigue and requests to return to bed. Pt strands with min HHA. Min A to take several steps to recliner chair and sit on EOB. Pt reports feeling SOB while on 2Ls via [place] with O2 saturation being WFLs. Sit >supine with mod A for trunk and B LEs. Purewick placed. Call bell and all needed items within reach.       If plan is discharge home, recommend the following:  A little help with walking and/or transfers;A lot of help with bathing/dressing/bathroom;Assistance with cooking/housework;Assist for transportation;Help with stairs or ramp for entrance   Equipment Recommendations  Other (comment) (defer)       Precautions / Restrictions Precautions Precautions: Fall Recall of Precautions/Restrictions: Intact Precaution/Restrictions Comments: monitor O2       Mobility Bed Mobility Overal bed mobility: Needs Assistance Bed Mobility: Sit to Supine       Sit to supine: Mod assist, Used rails, HOB elevated        Transfers Overall transfer level: Needs assistance Equipment used: 1 person hand held assist Transfers: Sit to/from Stand Sit to Stand: Min assist     Step pivot transfers: Min assist           Balance Overall balance assessment: Needs assistance, History of Falls Sitting-balance support: Feet supported Sitting balance-Leahy Scale: Good     Standing balance support: Bilateral upper extremity  supported, During functional activity Standing balance-Leahy Scale: Fair                             ADL either performed or assessed with clinical judgement    Extremity/Trunk Assessment Upper Extremity Assessment Upper Extremity Assessment: Generalized weakness   Lower Extremity Assessment Lower Extremity Assessment: Generalized weakness        Vision Patient Visual Report: No change from baseline           Communication Communication Communication: Impaired Factors Affecting Communication: Hearing impaired   Cognition Arousal: Alert Behavior During Therapy: WFL for tasks assessed/performed Cognition: No apparent impairments                               Following commands: Intact        Cueing   Cueing Techniques: Verbal cues             Pertinent Vitals/ Pain       Pain Assessment Pain Assessment: No/denies pain         Frequency  Min 2X/week        Progress Toward Goals  OT Goals(current goals can now be found in the care plan section)  Progress towards OT goals: Progressing toward goals      AM-PAC OT 6 Clicks Daily Activity     Outcome Measure   Help from another person eating meals?: None Help from another person taking care  of personal grooming?: A Little Help from another person toileting, which includes using toliet, bedpan, or urinal?: A Lot Help from another person bathing (including washing, rinsing, drying)?: A Lot Help from another person to put on and taking off regular upper body clothing?: A Little Help from another person to put on and taking off regular lower body clothing?: A Lot 6 Click Score: 16    End of Session Equipment Utilized During Treatment: Oxygen  OT Visit Diagnosis: Unsteadiness on feet (R26.81);Other abnormalities of gait and mobility (R26.89);Muscle weakness (generalized) (M62.81);History of falling (Z91.81)   Activity Tolerance Patient limited by fatigue;Patient tolerated  treatment well   Patient Left with call bell/phone within reach;in bed;with bed alarm set   Nurse Communication Mobility status        Time: 8484-8467 OT Time Calculation (min): 17 min  Charges: OT General Charges $OT Visit: 1 Visit OT Treatments $Therapeutic Activity: 8-22 mins  Izetta Claude, MS, OTR/L , CBIS ascom [phone]  11/24/2024, 4:01 PM

## 2024-11-24 NOTE — TOC Progression Note (Signed)
 Transition of Care [facility]) - Progression Note    Patient Details  Name: Jenny [name] MRN: [id number] Date of Birth: March 21, 1933  [facility] CM/SW Contact  Nathanael CHRISTELLA Ring, RN Phone Number: 11/24/2024, 12:08 PM  Clinical Narrative:    Son at the bedside with patient, he asked to speak with CM.  He and the patient both just wanted to know if the bed was still going to be available at [facility] since it has been taking so long for her to be medically ready for DC.  She still needs to have an angio of her left leg and now she has the flu.  I told him that they should have a bed for her when she is medically stable for discharge.    Expected Discharge Plan: Skilled Nursing Facility Barriers to Discharge: Continued Medical Work up               Expected Discharge Plan and Services     Post Acute Care Choice: Skilled Nursing Facility Living arrangements for the past 2 months: Skilled Nursing Facility, Single Family Home                                       Social Drivers of Health (SDOH) Interventions SDOH Screenings   Food Insecurity: No Food Insecurity (11/15/2024)  Housing: Low Risk (11/15/2024)  Transportation Needs: No Transportation Needs (11/15/2024)  Utilities: Not At Risk (11/15/2024)  Alcohol Screen: Low Risk (07/02/2024)  Depression (PHQ2-9): Low Risk (07/22/2022)  Financial Resource Strain: Low Risk (07/02/2024)  Social Connections: Socially Isolated (11/15/2024)  Tobacco Use: Medium Risk (11/15/2024)    Readmission Risk Interventions    07/05/2024   10:24 AM  Readmission Risk Prevention Plan  Transportation Screening Complete  HRI or Home Care Consult Complete  Social Work Consult for Recovery Care Planning/Counseling Complete  Palliative Care Screening Not Applicable  Medication Review Ocean[employer]) Complete

## 2024-11-24 NOTE — Progress Notes (Signed)
 Daily Progress Note   Patient Name: Jenny [name]       Date: 11/24/2024 DOB: 1933-10-03  Age: 89 y.o. MRN#: [id number] Attending Physician: Lanetta Lingo, MD Primary Care Physician: [facility]  Admit Date: 11/14/2024  Reason for Consultation/Follow-up: Establishing goals of care  Patient Profile/HPI: 89 y.o. female with past medical history of HFpEF, hypertension, persistent A-fib on Eliquis , tachybradycardia syndrome, CAD, COPD, CKD stage 3b, type 2 diabetes, anemia who was admitted on 11/14/2024 with lower extremity cellulitis, SIRS, acute dyspnea secondary to COPD and CHF exacerbations, segmental PE, and others. Has undergone L lower extremity angioplasty with placement of stents. She is pending RLE angiogram when her respiratory status recovers from influenza infection.   Subjective: Chart reviewed including labs, progress notes, imaging from this and previous encounters.  Respiratory swab positive for influenza A.  Vascular note reviewed- possible interventions planned for Thursday if she is able to lay flat.  Met with patient at bedside. She reports some improvement, but has had a difficult journey. Her main concern was if her bed would still be available at [facility]- however, this was addressed by SW that it will still be offered to her.   Review of Systems  Constitutional:  Positive for fever and malaise/fatigue.  Respiratory:  Positive for shortness of breath.      Physical Exam Vitals and nursing note reviewed.  Constitutional:      General: She is not in acute distress.    Appearance: She is ill-appearing.  Pulmonary:     Effort: Pulmonary effort is normal.  Neurological:     Mental Status: She is alert and oriented to person, place, and  time.             Vital Signs: BP (!) 134/94 (BP Location: Left Arm)   Pulse 98   Temp 98.4 F (36.9 C)   Resp 20   Ht 5' 2 (1.575 m)   Wt 87.6 kg   SpO2 98%   BMI 35.32 kg/m  SpO2: SpO2: 98 % O2 Device: O2 Device: Nasal Cannula O2 Flow Rate: O2 Flow Rate (L/min): (S) 2 L/min  Intake/output summary:  Intake/Output Summary (Last 24 hours) at 11/24/2024 1440 Last data filed at 11/24/2024 1300 Gross per 24 hour  Intake 360 ml  Output 2100 ml  Net -1740 ml   LBM: Last BM Date : 11/21/24 (Per patient) Baseline Weight: Weight: 100.8 kg Most recent weight: Weight: 87.6 kg       Palliative Assessment/Data: PPS: 50%      Patient Active Problem List   Diagnosis Date Noted   PAD (peripheral artery disease) 11/15/2024   Acute pulmonary embolism (HCC) 11/14/2024   Permanent atrial fibrillation (HCC) 11/14/2024   Obesity, Class III, BMI 40-49.9 (morbid obesity) (HCC) 11/14/2024   Sepsis (HCC) 11/14/2024   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) 10/28/2024   Respiratory distress 09/04/2024   CKD (chronic kidney disease) stage 4, GFR 15-29 ml/min (HCC) 09/03/2024   GERD (gastroesophageal reflux disease) 09/03/2024   History of atrial fibrillation 09/03/2024   Acute respiratory failure (HCC) 09/02/2024   Hyponatremia 09/02/2024   Hypokalemia 09/02/2024   AKI (acute kidney injury) 07/02/2024   Pulmonary hypertension, unspecified (HCC) 07/02/2024   Shortness of breath 07/02/2024   Acute dyspnea 07/01/2024   Right hip pain 06/21/2024   SOB (shortness of breath) 06/17/2024   COPD with acute exacerbation (HCC) 06/16/2024   Other secondary pulmonary hypertension (HCC) 10/14/2023   Junctional rhythm 09/27/2023   Acute on chronic heart failure with preserved ejection fraction (HFpEF) (HCC) 09/26/2023   Symptomatic bradycardia 09/26/2023   Pain due to onychomycosis of toenails of both feet 05/27/2023   Type II diabetes mellitus with peripheral circulatory  disorder (HCC) 05/27/2023   Tachycardia-bradycardia syndrome (HCC) 03/28/2022   Stage 3b chronic kidney disease (HCC) 02/21/2022   Type 2 diabetes mellitus with diabetic chronic kidney disease (HCC) 02/21/2022   Normocytic anemia 02/21/2022   Chronic obstructive pulmonary disease, unspecified (HCC) 02/21/2022   Edema, unspecified 02/21/2022   Hypertension 02/21/2022   Congestive heart failure (HCC) 02/21/2022   Other and unspecified hyperlipidemia 02/21/2022   Old myocardial infarction 02/21/2022   PAF (paroxysmal atrial fibrillation) (HCC) 02/21/2022   Heart failure (HCC) 11/04/2021   Acute on chronic diastolic CHF (congestive heart failure) (HCC) 11/03/2021   Secondary hypercoagulable state 10/23/2021   Persistent atrial fibrillation (HCC) 10/11/2021   Bilateral lower leg cellulitis 08/22/2021   Obese    Dyspepsia 04/05/2011   Allergic rhinitis 10/16/2010   COPD (chronic obstructive pulmonary disease) (HCC) 05/21/2010   Essential hypertension 04/06/2010   EDEMA 03/09/2010   ANEMIA-NOS 10/11/2009   Unspecified glaucoma 10/11/2009   CAD (coronary artery disease) 10/03/2009   HLD (hyperlipidemia) 07/12/2009   TOBACCO ABUSE 07/12/2009   Acute myocardial infarction [facility]) 07/12/2009   Diabetes mellitus 07/12/2009    Palliative Care Assessment & Plan    Assessment/Recommendations/Plan  Heart failure, PVD, influenza, COPD- continue current interventions- hopeful for angiogram and d/c to SNF-rehab   Code Status:   Code Status: Limited: Do not attempt resuscitation (DNR) -DNR-LIMITED -Do Not Intubate/DNI    Prognosis:  Unable to determine  Discharge Planning: To Be Determined    Thank you for allowing the Palliative Medicine Team to assist in the care of this patient.  Total time:  Prolonged billing:  Time includes:   Preparing to see the patient (e.g., review of tests) Obtaining and/or reviewing separately obtained history Performing a medically necessary  appropriate examination and/or evaluation Counseling and educating the patient/family/caregiver Ordering medications, tests, or procedures Referring and communicating with other health care professionals (when not reported separately) Documenting clinical information in the electronic or other health record Independently interpreting results (not reported separately) and communicating results to the patient/family/caregiver Care coordination (not reported separately) Clinical documentation  Dayquan Buys  Marcellus MANDES Palliative Medicine   Please contact Palliative Medicine Team phone at [phone] for questions and concerns.

## 2024-11-24 NOTE — Progress Notes (Addendum)
 Progress Note    11/24/2024 10:49 AM 7 Days Post-Op  Subjective:   Jenny [name] is an 89 yo female who is now POD #7 from:   Date of Surgery: 11/17/2024   Surgeon(s):DEW,JASON     Assistants:none   Pre-operative Diagnosis: PAD with ulceration of the right lower extremity   Post-operative diagnosis:  Same   Procedure(s) Performed:             1.  Ultrasound guidance for vascular access left femoral artery             2.  Catheter placement into right common femoral artery from left femoral approach             3.  Aortogram and selective right lower extremity angiogram             4.  Percutaneous transluminal angioplasty of right distal SFA with 5 mm diameter Lutonix drug-coated angioplasty balloon             5.  Stent placement to the right distal SFA with 6 mm diameter by 10 cm length life stent             6.  Stent placement to the right external iliac artery with 8 mm diameter by 8 cm length life star stent             7.  Celt closure device left femoral artery   EBL: 5 cc   Contrast: 50 cc   Fluoro Time: 5.3 minutes   Patient is seen comfortably in bed this morning.  She endorses that her left leg feels much better than it did yesterday.  She endorses she continues to have the same pain and problems with her right leg.  She is insisting that we investigate and evaluate her right lower extremity pain.  Patient continues to be SOB due to Flu. Endorses she can only lay flat for about 2-3 minutes right now today. No complaints overnight vitals all remained stable.   Vitals:   11/24/24 0734 11/24/24 0850  BP:  (!) 134/94  Pulse:  98  Resp:  20  Temp:  98.4 F (36.9 C)  SpO2: 99% 98%   Physical Exam: Cardiac:  RRR, Normal S1, S2. No murmurs. Lungs:  Positive labored breathing due to her COPD with 2 liters [place] oxygen. Positive Rhonchi throughout. No rales but diminished breath sounds in the bases.  Incisions: Left Femoral Artery Puncture site with dressing clean dry  and intact. No S&S of hematoma, seroma or infection to note.  Extremities:  All extremities are warm to touch with palpable pulses in upper extremities. Lower extremities are bandaged but assessment of toes being purple. Only able to get doppler DP/PT pulses and they are weak  Abdomen:  Positive bowel sounds throughout, morbid obesity, soft non tender  Neurologic: AAOX3 answers all questions and follows commands.   CBC    Component Value Date/Time   WBC 11.6 (H) 11/24/2024 0537   RBC 3.28 (L) 11/24/2024 0537   HGB 8.2 (L) 11/24/2024 0537   HCT 28.0 (L) 11/24/2024 0537   PLT 392 11/24/2024 0537   MCV 85.4 11/24/2024 0537   MCH 25.0 (L) 11/24/2024 0537   MCHC 29.3 (L) 11/24/2024 0537   RDW 18.7 (H) 11/24/2024 0537   LYMPHSABS 0.3 (L) 11/14/2024 0022   MONOABS 0.7 11/14/2024 0022   EOSABS 0.0 11/14/2024 0022   BASOSABS 0.0 11/14/2024 0022    BMET    Component Value Date/Time  NA 139 11/24/2024 0537   NA 139 10/21/2024 1046   K 4.7 11/24/2024 0537   CL 95 (L) 11/24/2024 0537   CO2 35 (H) 11/24/2024 0537   GLUCOSE 168 (H) 11/24/2024 0537   BUN 39 (H) 11/24/2024 0537   BUN 48 (H) 10/21/2024 1046   CREATININE 1.39 (H) 11/24/2024 0537   CALCIUM  9.1 11/24/2024 0537   GFRNONAA 36 (L) 11/24/2024 0537   GFRAA  10/09/2009 0350    >60        The eGFR has been calculated using the MDRD equation. This calculation has not been validated in all clinical situations. eGFR's persistently <60 mL/min signify possible Chronic Kidney Disease.    INR    Component Value Date/Time   INR 1.3 (H) 11/14/2024 0354     Intake/Output Summary (Last 24 hours) at 11/24/2024 1049 Last data filed at 11/24/2024 0900 Gross per 24 hour  Intake 120 ml  Output 2100 ml  Net -1980 ml     Assessment/Plan:  89 y.o. female is s/p SEE ABOVE 7 Days Post-Op   PLAN Vascular surgery plans to take the patient to the vascular lab to complete the right lower extremity angiogram with possible intervention  on Thursday, 11/25/2024.  I notified the patient at the bedside this morning and we discussed the procedure again as well as the risks benefits and complications.  She verbalized understanding wishes to proceed. We will continue to monitor her breathing. Patient remains SOB today and is unable to ly flat again today. Will evaluate again tomorrow.    DVT prophylaxis:  Heparin  Infusion   Jenny [name] [facility] 11/24/2024 10:49 AM

## 2024-11-24 NOTE — Progress Notes (Signed)
 Physical Therapy Treatment Patient Details Name: Jenny [name] MRN: [id number] DOB: 1933-09-24 Today's Date: 11/24/2024   History of Present Illness Pt is an 89 y/o F admitted on 11/14/24 after presenting with c/o BLE pain, SOB. Pt is being treated for BLE cellulitis, also found to have acute PE. Pt is s/p RLE angio & stents on 11/17/24. Pt tested + for Influenza A on 11/21/24. PMH: HFpEF, HTN, persistent a-fib on Eliquis , tachybradycardia syndrome, CAD, COPD, CKD 3B, DM2, anemia    PT Comments  Pt received enema prior to session, now requesting to use BSC. MinA needed for sit<>stand transfers and side stepping bed to [facility] with RW for balance and line management. Increased SOB upon minimal exertion while on 2L O2, SpO2 remained between 96-99% throughout session. +Large BM with assist for hygiene. Pt positioned up in chair post session. Unable to tolerate further activity due to fatigue. Continue PT per POC.    If plan is discharge home, recommend the following: A little help with walking and/or transfers;A little help with bathing/dressing/bathroom;Assistance with cooking/housework;Assist for transportation;Help with stairs or ramp for entrance   Can travel by private vehicle     No  Equipment Recommendations  Other (comment) (Defer to next level of care)    Recommendations for Other Services       Precautions / Restrictions Precautions Precautions: Fall Recall of Precautions/Restrictions: Intact Precaution/Restrictions Comments: monitor O2 Restrictions Weight Bearing Restrictions Per Provider Order: No     Mobility  Bed Mobility Overal bed mobility: Needs Assistance Bed Mobility: Supine to Sit     Supine to sit: Mod assist, HOB elevated, Used rails          Transfers Overall transfer level: Needs assistance Equipment used: Rolling walker (2 wheels) Transfers: Sit to/from Stand Sit to Stand: Min assist           General transfer comment:  (MinA to stand from bed and  BSC with RW. MinA to side step with RW bed>chair while on 2L O2)    Ambulation/Gait               General Gait Details:  (Pt too fatigued for progressive gait after having a large BM)   Stairs             Wheelchair Mobility     Tilt Bed    Modified Rankin (Stroke Patients Only)       Balance Overall balance assessment: Needs assistance, History of Falls                                          Communication Communication Communication: Impaired Factors Affecting Communication: Hearing impaired  Cognition Arousal: Alert Behavior During Therapy: WFL for tasks assessed/performed   PT - Cognitive impairments: No apparent impairments                       PT - Cognition Comments: reports fear of falling 2/2 multiple falls prior to prevoius admission Following commands: Intact      Cueing Cueing Techniques: Verbal cues  Exercises      General Comments General comments (skin integrity, edema, etc.):  (Heparin  running via IV, purewick, 2L O2 with SpO2 above 96% throughout session)      Pertinent Vitals/Pain Pain Assessment Pain Assessment: No/denies pain    Home Living  Prior Function            PT Goals (current goals can now be found in the care plan section) Acute Rehab PT Goals Patient Stated Goal: get better    Frequency    Min 2X/week      PT Plan      Co-evaluation              AM-PAC PT 6 Clicks Mobility   Outcome Measure  Help needed turning from your back to your side while in a flat bed without using bedrails?: A Lot Help needed moving from lying on your back to sitting on the side of a flat bed without using bedrails?: A Lot Help needed moving to and from a bed to a chair (including a wheelchair)?: A Little Help needed standing up from a chair using your arms (e.g., wheelchair or bedside chair)?: A Little Help needed to walk in hospital room?: A  Little Help needed climbing 3-5 steps with a railing? : A Lot 6 Click Score: 15    End of Session Equipment Utilized During Treatment: Oxygen Activity Tolerance: Patient limited by fatigue Patient left: in chair;with call bell/phone within reach Nurse Communication: Mobility status PT Visit Diagnosis: Muscle weakness (generalized) (M62.81);History of falling (Z91.81);Difficulty in walking, not elsewhere classified (R26.2);Unsteadiness on feet (R26.81)     Time: 8765-8747 PT Time Calculation (min) (ACUTE ONLY): 18 min  Charges:    $Therapeutic Activity: 8-22 mins PT General Charges $$ ACUTE PT VISIT: 1 Visit                    Darice Bohr, PTA  Darice JAYSON Bohr 11/24/2024, 1:04 PM

## 2024-11-24 NOTE — Progress Notes (Signed)
 PHARMACY - ANTICOAGULATION CONSULT NOTE  Pharmacy Consult for IV Heparin   Indication: pulmonary embolus  Patient Measurements: Height: 5' 2 (157.5 cm) Weight: 87.6 kg (193 lb 2 oz) IBW/kg (Calculated) : 50.1 HEPARIN  DW (KG): 71.4  Labs: Recent Labs    11/22/24 0623 11/23/24 0541 11/24/24 0537  HGB 8.2* 8.4* 8.2*  HCT 28.4* 28.4* 28.0*  PLT 346 380 392  HEPARINUNFRC 0.43 0.40 0.42  CREATININE 1.31* 1.37* 1.39*   Estimated Creatinine Clearance: 28.2 mL/min (A) (by C-G formula based on SCr of 1.39 mg/dL (H)).  Medical History: Past Medical History:  Diagnosis Date   ACUT DUOD ULCER W/HEMORR&PERF W/O MENTION OBST 10/05/2009   NSAID induced   ALLERGIC RHINITIS CAUSE UNSPECIFIED    ANEMIA-NOS    CAD (coronary artery disease) 06/08/2009   DEs RCA with 70% LAD and EF 60%   CHF (congestive heart failure) (HCC)    COPD    mild obst on PFTs 03/2010   Diabetes mellitus 06/2010 dx   Mild, diet controlled   GLAUCOMA    HYPERLIPIDEMIA    HYPERTENSION, BENIGN    MYOCARDIAL INFARCTION 06/08/2009   des to rca   Persistent atrial fibrillation (HCC)    Dx 08/2021   TOBACCO ABUSE    Assessment: Pharmacy consulted to dose heparin  in this 89 year old female admitted with PE.  Pt was on Eliquis  2.5 mg PO BID PTA, last dose on 12/6 @ 2100.  Goal of Therapy:  Heparin  level 0.3-0.7 units/ml aPTT 66 - 102  seconds Monitor platelets by anticoagulation protocol: Yes   12/9 1623 aPTT 121s, supratherapeutic @ 1100 u/hr 12/10 0155 aPTT 117s, supratherapeutic / HL 1.01 12/10 1052 aPTT 69s, therapeutic x 1 12/10 2223 aPTT 65, HL 0.73.  12/11 0421 aPTT 81 12/11 1148 aPTT 69 12/11 1922 aPTT 101, thera x 1; 950 un/h  12/12 0406 aPTT 137, HL 0.69 12/12 1231 HL 0.82 12/12 2257 HL 0.47, therapeutic x 1 12/13 0544 HL 0.47, therapeutic x 2 12/14 0617 HL 0.49, therapeutic x 3 12/15 0623 HL 0.43, therapeutic x 4 12/16 0541 HL 0.40, therapeutic x 5 12/17 0537 HL 0.42, therapeutic x 6    Plan:   Will continue heparin  infusion at 750 units/hr. Recheck heparin  level daily w/ AM labs while therapeutic. CBC daily while on heparin .   Gee Habig D 11/24/2024 6:59 AM

## 2024-11-25 DIAGNOSIS — L03116 Cellulitis of left lower limb: Secondary | ICD-10-CM | POA: Diagnosis not present

## 2024-11-25 DIAGNOSIS — L03115 Cellulitis of right lower limb: Secondary | ICD-10-CM | POA: Diagnosis not present

## 2024-11-25 LAB — CBC
HCT: 29.2 % — ABNORMAL LOW (ref 36.0–46.0)
Hemoglobin: 8.5 g/dL — ABNORMAL LOW (ref 12.0–15.0)
MCH: 24.6 pg — ABNORMAL LOW (ref 26.0–34.0)
MCHC: 29.1 g/dL — ABNORMAL LOW (ref 30.0–36.0)
MCV: 84.6 fL (ref 80.0–100.0)
Platelets: 432 K/uL — ABNORMAL HIGH (ref 150–400)
RBC: 3.45 MIL/uL — ABNORMAL LOW (ref 3.87–5.11)
RDW: 18.6 % — ABNORMAL HIGH (ref 11.5–15.5)
WBC: 11.2 K/uL — ABNORMAL HIGH (ref 4.0–10.5)
nRBC: 1 % — ABNORMAL HIGH (ref 0.0–0.2)

## 2024-11-25 LAB — GLUCOSE, CAPILLARY
Glucose-Capillary: 79 mg/dL (ref 70–99)
Glucose-Capillary: 230 mg/dL — ABNORMAL HIGH (ref 70–99)
Glucose-Capillary: 287 mg/dL — ABNORMAL HIGH (ref 70–99)
Glucose-Capillary: 185 mg/dL — ABNORMAL HIGH (ref 70–99)

## 2024-11-25 LAB — HEPARIN LEVEL (UNFRACTIONATED): Heparin Unfractionated: 0.42 [IU]/mL (ref 0.30–0.70)

## 2024-11-25 MED ORDER — MIDAZOLAM HCL 2 MG/ML PO SYRP: 8.0000 mg | ORAL_SOLUTION | Freq: Once | ORAL | Status: DC | PRN

## 2024-11-25 MED ORDER — METHYLPREDNISOLONE SODIUM SUCC 125 MG IJ SOLR: 125.0000 mg | Freq: Once | INTRAMUSCULAR | Status: DC | PRN

## 2024-11-25 MED ORDER — SODIUM CHLORIDE 0.9 % IV SOLN: INTRAVENOUS | Status: DC

## 2024-11-25 MED ORDER — DIPHENHYDRAMINE HCL 50 MG/ML IJ SOLN: 50.0000 mg | Freq: Once | INTRAMUSCULAR | Status: DC | PRN

## 2024-11-25 MED ORDER — FAMOTIDINE 20 MG PO TABS: 40.0000 mg | ORAL_TABLET | Freq: Once | ORAL | Status: DC | PRN

## 2024-11-25 MED ORDER — CEFAZOLIN SODIUM-DEXTROSE 2-4 GM/100ML-% IV SOLN: 2.0000 g | INTRAVENOUS | Status: DC

## 2024-11-25 NOTE — Plan of Care (Signed)

## 2024-11-25 NOTE — Progress Notes (Signed)
 Progress Note    11/25/2024 8:45 AM 8 Days Post-Op  Subjective: Jenny [name] is an 89 yo female who is now POD #7 from:   Date of Surgery: 11/17/2024   Surgeon(s):DEW,JASON     Assistants:none   Pre-operative Diagnosis: PAD with ulceration of the right lower extremity   Post-operative diagnosis:  Same   Procedure(s) Performed:             1.  Ultrasound guidance for vascular access left femoral artery             2.  Catheter placement into right common femoral artery from left femoral approach             3.  Aortogram and selective right lower extremity angiogram             4.  Percutaneous transluminal angioplasty of right distal SFA with 5 mm diameter Lutonix drug-coated angioplasty balloon             5.  Stent placement to the right distal SFA with 6 mm diameter by 10 cm length life stent             6.  Stent placement to the right external iliac artery with 8 mm diameter by 8 cm length life star stent             7.  Celt closure device left femoral artery   EBL: 5 cc   Contrast: 50 cc  Patient is seen comfortably in bed this morning. She endorses that her left leg feels much better slowly. She endorses she continues to have the same pain and problems with her right leg. She is insisting that we investigate and evaluate her right lower extremity pain. Patient continues to be SOB due to Flu. Endorses she can only lay flat for about 2-3 minutes right now today. No complaints overnight vitals all remained stable.  Vitals:   11/25/24 0748 11/25/24 0812  BP: (!) 153/81   Pulse: (!) 51   Resp: 19   Temp: 97.6 F (36.4 C)   SpO2: 97% 94%   Physical Exam: Cardiac:  RRR, Normal S1, S2. No murmurs. Lungs:  Positive labored breathing due to her COPD with 2 liters [place] oxygen. Positive Rhonchi throughout. No rales but diminished breath sounds in the bases.  Incisions: Left Femoral Artery Puncture site with dressing clean dry and intact. No S&S of hematoma, seroma or  infection to note.  Extremities:  All extremities are warm to touch with palpable pulses in upper extremities. Lower extremities are bandaged but assessment of toes being purple. Only able to get doppler DP/PT pulses and they are weak  Abdomen:  Positive bowel sounds throughout, morbid obesity, soft non tender  Neurologic: AAOX3 answers all questions and follows commands.   CBC    Component Value Date/Time   WBC 11.2 (H) 11/25/2024 0555   RBC 3.45 (L) 11/25/2024 0555   HGB 8.5 (L) 11/25/2024 0555   HCT 29.2 (L) 11/25/2024 0555   PLT 432 (H) 11/25/2024 0555   MCV 84.6 11/25/2024 0555   MCH 24.6 (L) 11/25/2024 0555   MCHC 29.1 (L) 11/25/2024 0555   RDW 18.6 (H) 11/25/2024 0555   LYMPHSABS 0.3 (L) 11/14/2024 0022   MONOABS 0.7 11/14/2024 0022   EOSABS 0.0 11/14/2024 0022   BASOSABS 0.0 11/14/2024 0022    BMET    Component Value Date/Time   NA 139 11/24/2024 0537   NA 139 10/21/2024 1046   K  4.7 11/24/2024 0537   CL 95 (L) 11/24/2024 0537   CO2 35 (H) 11/24/2024 0537   GLUCOSE 168 (H) 11/24/2024 0537   BUN 39 (H) 11/24/2024 0537   BUN 48 (H) 10/21/2024 1046   CREATININE 1.39 (H) 11/24/2024 0537   CALCIUM  9.1 11/24/2024 0537   GFRNONAA 36 (L) 11/24/2024 0537   GFRAA  10/09/2009 0350    >60        The eGFR has been calculated using the MDRD equation. This calculation has not been validated in all clinical situations. eGFR's persistently <60 mL/min signify possible Chronic Kidney Disease.    INR    Component Value Date/Time   INR 1.3 (H) 11/14/2024 0354     Intake/Output Summary (Last 24 hours) at 11/25/2024 0845 Last data filed at 11/24/2024 1551 Gross per 24 hour  Intake 360 ml  Output 850 ml  Net -490 ml     Assessment/Plan:  89 y.o. female is s/p SEE ABOVE 8 Days Post-Op   PLAN Patients angiogram today will be rescheduled to Monday 11/29/2024 due to breathing difficulties. Patient endorses she feels she is unable to lay flat again this morning. I  placed her in a flat position to simulate the angiogram and she did desaturate with oxygen in place in under 3 minutes. Dr Lanetta was made aware. I placed a diet order for the patient. She will be NPO after midnight on Monday 11/29/2024.  DVT prophylaxis:  Heparin  Infusion   [name] [facility] 11/25/2024 8:45 AM

## 2024-11-25 NOTE — Progress Notes (Signed)
 Physical Therapy Treatment Patient Details Name: Jenny [name] MRN: [id number] DOB: Mar 07, 1933 Today's Date: 11/25/2024   History of Present Illness Pt is an 89 y/o F admitted on 11/14/24 after presenting with c/o BLE pain, SOB. Pt is being treated for BLE cellulitis, also found to have acute PE. Pt is s/p RLE angio & stents on 11/17/24. Pt tested + for Influenza A on 11/21/24. PMH: HFpEF, HTN, persistent a-fib on Eliquis , tachybradycardia syndrome, CAD, COPD, CKD 3B, DM2, anemia    PT Comments  Pt on 3L O2 today, up from 2L yesterday. SpO2 remained >95% during session despite severe SOB during minimal exertion requiring increased time to mobilize out of bed with frequent rest breaks. She continues to require ModA for bed mobility, MinA for transfers and safe use of RW bed>chair. Unable to progress gait training due to significant dyspnea. Pt positioned in upright sitting in chair for lunch all needs in reach. Will continue to progress as tolerated.   If plan is discharge home, recommend the following: A little help with walking and/or transfers;A little help with bathing/dressing/bathroom;Assistance with cooking/housework;Assist for transportation;Help with stairs or ramp for entrance   Can travel by private vehicle     No  Equipment Recommendations  Other (comment) (TBD at next level of care)    Recommendations for Other Services       Precautions / Restrictions Precautions Precautions: Fall Recall of Precautions/Restrictions: Intact Precaution/Restrictions Comments: monitor O2 Restrictions Weight Bearing Restrictions Per Provider Order: No     Mobility  Bed Mobility Overal bed mobility: Needs Assistance Bed Mobility: Supine to Sit     Supine to sit: Mod assist, HOB elevated, Used rails     General bed mobility comments:  (Increased time and rest breaks due to DOE)    Transfers Overall transfer level: Needs assistance Equipment used: Rolling walker (2 wheels) Transfers:  Sit to/from Stand Sit to Stand: Min assist   Step pivot transfers: Min assist (Use of RW)       General transfer comment:  (MinA for safety and cues to use RW upon turning and taking a few steps towards bedside chair)    Ambulation/Gait               General Gait Details: Unable to tolerate gait training due to significant SOB with minimal exertion on 3L O2. SpO2 remained >95% throughout session   Stairs             Wheelchair Mobility     Tilt Bed    Modified Rankin (Stroke Patients Only)       Balance Overall balance assessment: Needs assistance, History of Falls Sitting-balance support: Feet supported Sitting balance-Leahy Scale: Fair Sitting balance - Comments:  (Good static sitting balance EOB, Fair balance while reaching slightly out of midline BOS)   Standing balance support: Bilateral upper extremity supported, During functional activity Standing balance-Leahy Scale: Fair Standing balance comment: heavy reliance of BUEs on RW, fall risk                            Communication Communication Communication: Impaired Factors Affecting Communication: Hearing impaired  Cognition Arousal: Alert Behavior During Therapy: WFL for tasks assessed/performed   PT - Cognitive impairments: No apparent impairments                       PT - Cognition Comments: reports fear of falling 2/2 multiple falls prior to prevoius  admission Following commands: Intact      Cueing Cueing Techniques: Verbal cues  Exercises Other Exercises Other Exercises: Pt educated on PLB technique and energy conservation    General Comments General comments (skin integrity, edema, etc.):  (B Lower legs wrapped, 3L O2 > from previous day.)      Pertinent Vitals/Pain Pain Assessment Pain Assessment: No/denies pain    Home Living                          Prior Function            PT Goals (current goals can now be found in the care plan  section) Acute Rehab PT Goals Patient Stated Goal: get better    Frequency    Min 2X/week      PT Plan      Co-evaluation              AM-PAC PT 6 Clicks Mobility   Outcome Measure  Help needed turning from your back to your side while in a flat bed without using bedrails?: A Lot Help needed moving from lying on your back to sitting on the side of a flat bed without using bedrails?: A Lot Help needed moving to and from a bed to a chair (including a wheelchair)?: A Little Help needed standing up from a chair using your arms (e.g., wheelchair or bedside chair)?: A Little Help needed to walk in hospital room?: A Little Help needed climbing 3-5 steps with a railing? : A Lot 6 Click Score: 15    End of Session Equipment Utilized During Treatment: Oxygen Activity Tolerance: Patient limited by fatigue;Other (comment) (Limited by DOE) Patient left: in chair;with call bell/phone within reach Nurse Communication: Mobility status PT Visit Diagnosis: Muscle weakness (generalized) (M62.81);History of falling (Z91.81);Difficulty in walking, not elsewhere classified (R26.2);Unsteadiness on feet (R26.81)     Time: 1339-1400 PT Time Calculation (min) (ACUTE ONLY): 21 min  Charges:    $Therapeutic Activity: 8-22 mins PT General Charges $$ ACUTE PT VISIT: 1 Visit                    Jenny [name], PTA  Jenny [name] 11/25/2024, 2:06 PM

## 2024-11-25 NOTE — Progress Notes (Signed)
 PHARMACY - ANTICOAGULATION CONSULT NOTE  Pharmacy Consult for IV Heparin   Indication: pulmonary embolus  Patient Measurements: Height: 5' 2 (157.5 cm) Weight: 87.6 kg (193 lb 2 oz) IBW/kg (Calculated) : 50.1 HEPARIN  DW (KG): 71.4  Labs: Recent Labs    11/23/24 0541 11/24/24 0537 11/25/24 0555  HGB 8.4* 8.2* 8.5*  HCT 28.4* 28.0* 29.2*  PLT 380 392 432*  HEPARINUNFRC 0.40 0.42 0.42  CREATININE 1.37* 1.39*  --    Estimated Creatinine Clearance: 28.2 mL/min (A) (by C-G formula based on SCr of 1.39 mg/dL (H)).  Medical History: Past Medical History:  Diagnosis Date   ACUT DUOD ULCER W/HEMORR&PERF W/O MENTION OBST 10/05/2009   NSAID induced   ALLERGIC RHINITIS CAUSE UNSPECIFIED    ANEMIA-NOS    CAD (coronary artery disease) 06/08/2009   DEs RCA with 70% LAD and EF 60%   CHF (congestive heart failure) (HCC)    COPD    mild obst on PFTs 03/2010   Diabetes mellitus 06/2010 dx   Mild, diet controlled   GLAUCOMA    HYPERLIPIDEMIA    HYPERTENSION, BENIGN    MYOCARDIAL INFARCTION 06/08/2009   des to rca   Persistent atrial fibrillation (HCC)    Dx 08/2021   TOBACCO ABUSE    Assessment: Pharmacy consulted to dose heparin  in this 89 year old female admitted with PE.  Pt was on Eliquis  2.5 mg PO BID PTA, last dose on 12/6 @ 2100.  Goal of Therapy:  Heparin  level 0.3-0.7 units/ml aPTT 66 - 102  seconds Monitor platelets by anticoagulation protocol: Yes   12/9 1623 aPTT 121s, supratherapeutic @ 1100 u/hr 12/10 0155 aPTT 117s, supratherapeutic / HL 1.01 12/10 1052 aPTT 69s, therapeutic x 1 12/10 2223 aPTT 65, HL 0.73.  12/11 0421 aPTT 81 12/11 1148 aPTT 69 12/11 1922 aPTT 101, thera x 1; 950 un/h  12/12 0406 aPTT 137, HL 0.69 12/12 1231 HL 0.82 12/12 2257 HL 0.47, therapeutic x 1 12/13 0544 HL 0.47, therapeutic x 2 12/14 0617 HL 0.49, therapeutic x 3 12/15 0623 HL 0.43, therapeutic x 4 12/16 0541 HL 0.40, therapeutic x 5 12/17 0537 HL 0.42, therapeutic x 6  12/18  0555 HL 0.42, therapeutic x 7    Plan:  Will continue heparin  infusion at 750 units/hr. Recheck heparin  level daily w/ AM labs while therapeutic. CBC daily while on heparin .   Collyn Ribas D 11/25/2024 7:08 AM

## 2024-11-25 NOTE — Progress Notes (Signed)
 Progress Note   Patient: Jenny [name] FMW:[id number] DOB: 03-27-1933 DOA: 11/14/2024     11 DOS: the patient was seen and examined on 11/25/2024   Brief hospital course:  Jenny [name] is a 89 y.o. female with medical history significant for HFpEF, hypertension, persistent A-fib on Eliquis , tachybradycardia syndrome, CAD, COPD, CKD stage 3b, type 2 diabetes, anemia. Presents to ED from [facility] (rehab), 3 days LE edema and R leg pain 1 day, also SOB   12/07: to ED early hours AM. CTA chest showed tiny segmental PE left lower lobe without heart strain, extensive multivessel CAD and mild asymmetric pulmonary edema around the left hilum. Initially was treated with DuoNebs and Solu-Medrol . She initially received LR boluses but subsequently given a dose of Lasix  as test results came in. Started on vancomycin  and Rocephin  and also on a heparin  infusion for PE. Admitted with question sepsis d/t lower extremity cellulitis, vs non-infectious SIRS multifactorial including acute dyspnea secondary to COPD and CHF exacerbation. Podiatry consulted re: venous stasis ulcers on feet - bedisde debridement, appear superficial and recs for daily dressing changes, ACE wraps to knee, po abx on dc, WBAT, f/u outpatient 1 week after discharge   12/08: Vascular surgery consult d/t decreased ABI / PE> No thrombectomy given small PE. RLE angio planned for 12/10 12/09: stable 12/10: LLE angio and stents today. Cr 1.99 12/11: renal fxn improving Cr 1.56. Vascular surgery plans on taking the patient to the vascular lab on Monday, 11/22/2024 for RLE angiogram - pt to remain on heparin  infusion until after procedure  12/12: doing well / stable.  12/13: VERY short of breath w/ minimal exertion but improves quickly w/ rest. Changed nebs, will give 1 dose IV lasix  today  12/14: good UOP w/ IV lasix , increased po dose today. Still quite SOB, wheezing. (+)influenza A --> tamiflu , postponing angio 12/15-12/16: continue tx  COPD exacerbation d/t influenza. Tentative reschedule angio for 12/18 12/17 -still short of breath but improved.  Remains on 2 L of oxygen via nasal cannula.  Unable to lay flat 12/18 -still short of breath but improved.  Right lower extremity angiogram deferred since patient is unable to lay flat.  Planned for 12/22       Assessment and Plan:  LE edema / erythema  Multifactorial, include: Venous stasis dermatitis / ulceration  Cellulitis  Peripheral arterial disease LE edema d/t CHF / HFpEF exacerbation  Treat underlying cause(s) as noted    Dyspnea, multifactorial COPD likely exacerbated d/t influenza A infection.  Remains on Tamiflu  to complete a 5-day course of therapy CHF appears stable/euvolemic now post-treatment.  Change Lasix  to every other day due to contraction alkalosis Continue bronchodilator therapy and inhaled steroids [place] O2 to maintain pulse oximetry greater than 92% Tx Flu, COPD, and CHF as noted below   COPD  Chronic hypoxic respiratory failure on 2L [place] O2 at baseline  Exacerbated now d/t influenza A Arrives w/ prednisone  20 on med list, says has been maintained on that for a couple of months prednisone  to 10 daily --> burst 40 mg daily x5 days given new exacerbation, then ordered back to 10 mg daily to restart Saturday  Continue O2 supplementation at 2 L Tamiflu  per pharmacy renal dose to complete a 5-day course of therapy Continue schedule nebs --> budesonide  bid, duoneb q6h while awake Albuterol  prn  will need close outpt pulm f/u   Venous stasis  Venous stasis ulcers lower extremities bilaterally Question Cellulitis daily dressing changes,  ACE wraps to knee Continue keflex  for 7-10d total  WBAT f/u podiatry outpatient 1 week after discharge   Peripheral arterial disease  S/p angiogram RLE w/ stenting 11/17/24 Vascular surgery following Tentative reschedule angio for 12/22    Acute PE Very small.  On apixaban  2.5 bid at baseline, patient  unsure if she's been receiving regularly at snf. Given cr under 1.5 assumes stays there would warrant full dose of 5 bid heparin  for now pending vascular angio See pharmacy note 12/11 for eliquis  - test claim for eliquis  5 mg bis $85   HFpEF with acute exacerbation - now stabilized  Recent hospitalization with dose reduction in diuretic, likely under-diuresed (discharged on lasix  40, had previously been on bid dosing).  Cr up from admission after IV diuresis, but stable Change Lasix  to 40 mg every other day due to contraction alkalosis Monitor I&O Monitor BMP    Hyperkalemia - resolved  Monitor BMP   AKI on ckd 3b - resolved  Cr 1.39 on admission --> peak at 2.09 on 12/08 --> 11/21/2024 1.29 Renal function is stable Monitor closely   History tachy-brady A-fib Rate controlled currently heparin  as primary prophylaxis for an acute stroke while Eliquis  is on hold as above Resume eliquis  on discharge as above - See pharmacy note 12/11 for eliquis  - test claim for eliquis  5 mg bid $85   Elevated troponin, ACS ruled out  Mild, stable, flat, likely demand as no chest pain Recheck troponin / EKG prn chest pain.    Neuropathy home gabapentin    Pulm nodule outpt f/u       Debility Comes from rehab PT consulted patient does not want to return to [facility]. TOC aware, looking elsewhere but if refusing available facility then will have to DC to home instead if unable to arrange alternative placement        Class 2 / borderline Class 3 obesity based on BMI:  Body mass index is 35.32kg/m. Complicates overall prognosis and care           Subjective: Less short of breath today.  States that she is still unable to lay flat  Physical Exam: Vitals:   11/24/24 2041 11/25/24 0457 11/25/24 0748 11/25/24 0812  BP: (!) 159/75 134/83 (!) 153/81   Pulse: 84 92 (!) 51   Resp:   19   Temp:  97.8 F (36.6 C) 97.6 F (36.4 C)   TempSrc:      SpO2: 100% 100% 97% 94%   Weight:      Height:       General: Chronically ill-appearing, appears comfortable and in no distress HEENT : Pale conjunctiva, anicteric sclera Cardiac:  RRR, Normal S1, S2. No murmurs. Lungs: Rales at the bases, scattered rhonchi Cardiovascular : RRR S1,S2  Abdomen:  Positive bowel sounds throughout, morbid obesity, soft non tender  Neurologic: AAOX3 answers all questions and follows commands.  Extremities:  All extremities are warm to touch with palpable pulses in upper extremities. Lower extremities are bandaged      Data Reviewed: White count 11.2, hemoglobin 8.5 Labs reviewed  Family Communication: Plan of care was discussed with patient in detail.  She verbalizes understanding and agrees with the plan  Disposition: Status is: Inpatient Remains inpatient appropriate because: Scheduled for right lower extremity angiogram on 12/22  Planned Discharge Destination: Skilled nursing facility    Time spent: 40 minutes  Author: Aimee Somerset, MD 11/25/2024 12:30 PM  For on call review [email or web address].

## 2024-11-26 DIAGNOSIS — L03116 Cellulitis of left lower limb: Secondary | ICD-10-CM | POA: Diagnosis not present

## 2024-11-26 DIAGNOSIS — L03115 Cellulitis of right lower limb: Secondary | ICD-10-CM | POA: Diagnosis not present

## 2024-11-26 LAB — CBC
HCT: 31.2 % — ABNORMAL LOW (ref 36.0–46.0)
Hemoglobin: 8.9 g/dL — ABNORMAL LOW (ref 12.0–15.0)
MCH: 24.3 pg — ABNORMAL LOW (ref 26.0–34.0)
MCHC: 28.5 g/dL — ABNORMAL LOW (ref 30.0–36.0)
MCV: 85.2 fL (ref 80.0–100.0)
Platelets: 483 K/uL — ABNORMAL HIGH (ref 150–400)
RBC: 3.66 MIL/uL — ABNORMAL LOW (ref 3.87–5.11)
RDW: 18.6 % — ABNORMAL HIGH (ref 11.5–15.5)
WBC: 12.5 K/uL — ABNORMAL HIGH (ref 4.0–10.5)
nRBC: 1.1 % — ABNORMAL HIGH (ref 0.0–0.2)

## 2024-11-26 LAB — GLUCOSE, CAPILLARY
Glucose-Capillary: 86 mg/dL (ref 70–99)
Glucose-Capillary: 215 mg/dL — ABNORMAL HIGH (ref 70–99)
Glucose-Capillary: 182 mg/dL — ABNORMAL HIGH (ref 70–99)
Glucose-Capillary: 207 mg/dL — ABNORMAL HIGH (ref 70–99)

## 2024-11-26 LAB — HEPARIN LEVEL (UNFRACTIONATED): Heparin Unfractionated: 0.38 [IU]/mL (ref 0.30–0.70)

## 2024-11-26 MED ORDER — SENNOSIDES-DOCUSATE SODIUM 8.6-50 MG PO TABS
2.0000 | ORAL_TABLET | Freq: Every day | ORAL | Status: DC
  Administered 2024-11-26 – 2024-12-02 (×6): 2 via ORAL
  Filled 2024-11-26 (×7): qty 2

## 2024-11-26 MED ORDER — IPRATROPIUM-ALBUTEROL 0.5-2.5 (3) MG/3ML IN SOLN
3.0000 mL | Freq: Three times a day (TID) | RESPIRATORY_TRACT | Status: DC
  Administered 2024-11-26 – 2024-12-02 (×16): 3 mL via RESPIRATORY_TRACT
  Filled 2024-11-26 (×17): qty 3

## 2024-11-26 NOTE — Plan of Care (Signed)
  Problem: Clinical Measurements: Goal: Respiratory complications will improve Outcome: Progressing   Problem: Nutrition: Goal: Adequate nutrition will be maintained Outcome: Progressing   Problem: Safety: Goal: Ability to remain free from injury will improve Outcome: Progressing   

## 2024-11-26 NOTE — Progress Notes (Addendum)
 PHARMACY - ANTICOAGULATION CONSULT NOTE  Pharmacy Consult for IV Heparin   Indication: pulmonary embolus  Patient Measurements: Height: 5' 2 (157.5 cm) Weight: 88 kg (194 lb 0.1 oz) IBW/kg (Calculated) : 50.1 HEPARIN  DW (KG): 71.4  Labs: Recent Labs    11/24/24 0537 11/25/24 0555 11/26/24 0555  HGB 8.2* 8.5* 8.9*  HCT 28.0* 29.2* 31.2*  PLT 392 432* 483*  HEPARINUNFRC 0.42 0.42 0.38  CREATININE 1.39*  --   --    Estimated Creatinine Clearance: 28.3 mL/min (A) (by C-G formula based on SCr of 1.39 mg/dL (H)).  Medical History: Past Medical History:  Diagnosis Date   ACUT DUOD ULCER W/HEMORR&PERF W/O MENTION OBST 10/05/2009   NSAID induced   ALLERGIC RHINITIS CAUSE UNSPECIFIED    ANEMIA-NOS    CAD (coronary artery disease) 06/08/2009   DEs RCA with 70% LAD and EF 60%   CHF (congestive heart failure) (HCC)    COPD    mild obst on PFTs 03/2010   Diabetes mellitus 06/2010 dx   Mild, diet controlled   GLAUCOMA    HYPERLIPIDEMIA    HYPERTENSION, BENIGN    MYOCARDIAL INFARCTION 06/08/2009   des to rca   Persistent atrial fibrillation (HCC)    Dx 08/2021   TOBACCO ABUSE    Assessment: Pharmacy consulted to dose heparin  in this 89 year old female admitted with PE.  Pt was on Eliquis  2.5 mg PO BID PTA, last dose on 12/6 @ 2100.  Goal of Therapy:  Heparin  level 0.3-0.7 units/ml aPTT 66 - 102  seconds Monitor platelets by anticoagulation protocol: Yes   12/9 1623 aPTT 121s, supratherapeutic @ 1100 u/hr 12/10 0155 aPTT 117s, supratherapeutic / HL 1.01 12/10 1052 aPTT 69s, therapeutic x 1 12/10 2223 aPTT 65, HL 0.73.  12/11 0421 aPTT 81 12/11 1148 aPTT 69 12/11 1922 aPTT 101, thera x 1; 950 un/h  12/12 0406 aPTT 137, HL 0.69 12/12 1231 HL 0.82 12/12 2257 HL 0.47, therapeutic x 1 12/13 0544 HL 0.47, therapeutic x 2 12/14 0617 HL 0.49, therapeutic x 3 12/15 0623 HL 0.43, therapeutic x 4 12/16 0541 HL 0.40, therapeutic x 5 12/17 0537 HL 0.42, therapeutic x 6  12/18  0555 HL 0.42, therapeutic x 7 12/19 0555 HL 0.38, therapeutic   Plan:  Will continue heparin  infusion at 750 units/hr. Recheck heparin  level daily w/ AM labs while therapeutic. CBC daily while on heparin .   Will M. Lenon, PharmD, BCPS Clinical Pharmacist 11/26/2024 7:18 AM

## 2024-11-26 NOTE — Progress Notes (Signed)
 " Progress Note   Patient: Jenny [name] FMW:[id number] DOB: 15-Sep-1933 DOA: 11/14/2024     12 DOS: the patient was seen and examined on 11/26/2024   Brief hospital course:  Jenny [name] is a 89 y.o. female with medical history significant for HFpEF, hypertension, persistent A-fib on Eliquis , tachybradycardia syndrome, CAD, COPD, CKD stage 3b, type 2 diabetes, anemia. Presents to ED from [facility] (rehab), 3 days LE edema and R leg pain 1 day, also SOB   12/07: to ED early hours AM. CTA chest showed tiny segmental PE left lower lobe without heart strain, extensive multivessel CAD and mild asymmetric pulmonary edema around the left hilum. Initially was treated with DuoNebs and Solu-Medrol . She initially received LR boluses but subsequently given a dose of Lasix  as test results came in. Started on vancomycin  and Rocephin  and also on a heparin  infusion for PE. Admitted with question sepsis d/t lower extremity cellulitis, vs non-infectious SIRS multifactorial including acute dyspnea secondary to COPD and CHF exacerbation. Podiatry consulted re: venous stasis ulcers on feet - bedisde debridement, appear superficial and recs for daily dressing changes, ACE wraps to knee, po abx on dc, WBAT, f/u outpatient 1 week after discharge   12/08: Vascular surgery consult d/t decreased ABI / PE> No thrombectomy given small PE. RLE angio planned for 12/10 12/09: stable 12/10: LLE angio and stents today. Cr 1.99 12/11: renal fxn improving Cr 1.56. Vascular surgery plans on taking the patient to the vascular lab on Monday, 11/22/2024 for RLE angiogram - pt to remain on heparin  infusion until after procedure  12/12: doing well / stable.  12/13: VERY short of breath w/ minimal exertion but improves quickly w/ rest. Changed nebs, will give 1 dose IV lasix  today  12/14: good UOP w/ IV lasix , increased po dose today. Still quite SOB, wheezing. (+)influenza A --> tamiflu , postponing angio 12/15-12/16: continue tx  COPD exacerbation d/t influenza. Tentative reschedule angio for 12/18 12/17 -still short of breath but improved.  Remains on 2 L of oxygen via nasal cannula.  Unable to lay flat 12/18 -still short of breath but improved.  Right lower extremity angiogram deferred since patient is unable to lay flat.  Planned for 12/22 12/19 -complains of worsening shortness of breath.  Noted to have wheezing on exam.       Assessment and Plan:   LE edema / erythema  Multifactorial, include: Venous stasis dermatitis / ulceration  Cellulitis  Peripheral arterial disease LE edema d/t CHF / HFpEF exacerbation  Treat underlying cause(s) as noted    Dyspnea, multifactorial COPD likely exacerbated by influenza A infection.  Has completed a 5-day course of Tamiflu  CHF appears stable/euvolemic now post-treatment.  Continue Lasix  to every other day due to contraction alkalosis Continue bronchodilator therapy and changed to 3 times daily as well as inhaled steroids [place] O2 to maintain pulse oximetry greater than 92% Tx Flu, COPD, and CHF as noted below   COPD  Chronic hypoxic respiratory failure on 2L [place] O2 at baseline  Exacerbated now d/t influenza A Arrives w/ prednisone  20 on med list, says has been maintained on that for a couple of months prednisone  switched back to 10 daily (12/19) --> burst 40 mg daily x 5 days given new exacerbation, then ordered back to 10 mg daily  Continue O2 supplementation at 2 L Completed course of Tamiflu  Continue schedule nebs --> budesonide  bid, duoneb TID while awake Albuterol  prn  will need close outpt pulm f/u  Venous stasis  Venous stasis ulcers lower extremities bilaterally Question Cellulitis daily dressing changes, ACE wraps to knee Completed course of Keflex  WBAT f/u podiatry outpatient 1 week after discharge   Peripheral arterial disease  S/p angiogram RLE w/ stenting 11/17/24 Vascular surgery following Tentative reschedule angio for 12/22     Acute PE Very small.  On apixaban  2.5 bid at baseline, patient unsure if she's been receiving regularly at snf. Given cr under 1.5 assumes stays there would warrant full dose of 5 bid heparin  for now pending vascular angio See pharmacy note 12/11 for eliquis  - test claim for eliquis  5 mg bis $85   HFpEF with acute exacerbation - now stabilized  Recent hospitalization with dose reduction in diuretic, likely under-diuresed (discharged on lasix  40, had previously been on bid dosing).  Cr up from admission after IV diuresis, but stable Continue Lasix  to 40 mg every other day due to contraction alkalosis Monitor I&O Monitor BMP    Hyperkalemia - resolved  Monitor BMP   AKI on ckd 3b - resolved  Cr 1.39 on admission --> peak at 2.09 on 12/08 --> 11/21/2024 1.29 Renal function is stable Monitor closely   History tachy-brady A-fib Rate controlled currently heparin  as primary prophylaxis for an acute stroke while Eliquis  is on hold as above Resume eliquis  on discharge as above - See pharmacy note 12/11 for eliquis  - test claim for eliquis  5 mg bid $85   Elevated troponin, ACS ruled out  Mild, stable, flat, likely demand as no chest pain Recheck troponin / EKG prn chest pain.    Neuropathy home gabapentin    Pulm nodule outpt f/u       Debility Comes from rehab PT consulted patient does not want to return to [facility]. TOC aware, looking elsewhere but if refusing available facility then will have to DC to home instead if unable to arrange alternative placement        Class 2 / borderline Class 3 obesity based on BMI:  Body mass index is 35.32kg/m. Complicates overall prognosis and care          Subjective: Complains of worsening shortness of breath.  Noted to have wheezing on exam  Physical Exam: Vitals:   11/26/24 0505 11/26/24 0720 11/26/24 0816 11/26/24 0955  BP: (!) 156/70  (!) 141/47   Pulse: (!) 52  (!) 48   Resp: 18  16   Temp: 98.7 F (37.1 C)   97.6 F (36.4 C)   TempSrc: Oral     SpO2: 96% 95% 100% 99%  Weight:      Height:       General: Chronically ill-appearing, appears comfortable and in no distress HEENT : Pale conjunctiva, anicteric sclera Cardiac:  RRR, Normal S1, S2. No murmurs. Lungs: Rales at the bases, scattered rhonchi, scattered wheezes in all lung fields Cardiovascular : RRR S1,S2  Abdomen:  Positive bowel sounds throughout, morbid obesity, soft non tender  Neurologic: AAOX3 answers all questions and follows commands.  Extremities:  All extremities are warm to touch with palpable pulses in upper extremities. Lower extremities are bandaged    Data Reviewed: Labs reviewed.  White count 12.5, hemoglobin 8.9 Labs reviewed  Family Communication: Plan of care discussed with patient at the bedside.  She verbalizes understanding and agrees with the plan  Disposition: Status is: Inpatient Remains inpatient appropriate because: For angiogram on 12/23  Planned Discharge Destination: Skilled nursing facility    Time spent: 40  minutes  Author: Aimee Tifini Reeder,  MD 11/26/2024 12:37 PM  For on call review [email or web address]  "

## 2024-11-27 DIAGNOSIS — L03116 Cellulitis of left lower limb: Secondary | ICD-10-CM | POA: Diagnosis not present

## 2024-11-27 DIAGNOSIS — L03115 Cellulitis of right lower limb: Secondary | ICD-10-CM | POA: Diagnosis not present

## 2024-11-27 LAB — CBC
HCT: 29.2 % — ABNORMAL LOW (ref 36.0–46.0)
Hemoglobin: 8.2 g/dL — ABNORMAL LOW (ref 12.0–15.0)
MCH: 24.4 pg — ABNORMAL LOW (ref 26.0–34.0)
MCHC: 28.1 g/dL — ABNORMAL LOW (ref 30.0–36.0)
MCV: 86.9 fL (ref 80.0–100.0)
Platelets: 420 K/uL — ABNORMAL HIGH (ref 150–400)
RBC: 3.36 MIL/uL — ABNORMAL LOW (ref 3.87–5.11)
RDW: 18.6 % — ABNORMAL HIGH (ref 11.5–15.5)
WBC: 11.1 K/uL — ABNORMAL HIGH (ref 4.0–10.5)
nRBC: 1 % — ABNORMAL HIGH (ref 0.0–0.2)

## 2024-11-27 LAB — GLUCOSE, CAPILLARY
Glucose-Capillary: 106 mg/dL — ABNORMAL HIGH (ref 70–99)
Glucose-Capillary: 153 mg/dL — ABNORMAL HIGH (ref 70–99)
Glucose-Capillary: 174 mg/dL — ABNORMAL HIGH (ref 70–99)
Glucose-Capillary: 201 mg/dL — ABNORMAL HIGH (ref 70–99)

## 2024-11-27 LAB — HEPARIN LEVEL (UNFRACTIONATED): Heparin Unfractionated: 0.4 [IU]/mL (ref 0.30–0.70)

## 2024-11-27 NOTE — Progress Notes (Signed)
 " Progress Note   Patient: Jenny [name] FMW:[id number] DOB: 26-Oct-1933 DOA: 11/14/2024     13 DOS: the patient was seen and examined on 11/27/2024   Brief hospital course:  Jenny [name] is a 89 y.o. female with medical history significant for HFpEF, hypertension, persistent A-fib on Eliquis , tachybradycardia syndrome, CAD, COPD, CKD stage 3b, type 2 diabetes, anemia. Presents to ED from [facility] (rehab), 3 days LE edema and R leg pain 1 day, also SOB   12/07: to ED early hours AM. CTA chest showed tiny segmental PE left lower lobe without heart strain, extensive multivessel CAD and mild asymmetric pulmonary edema around the left hilum. Initially was treated with DuoNebs and Solu-Medrol . She initially received LR boluses but subsequently given a dose of Lasix  as test results came in. Started on vancomycin  and Rocephin  and also on a heparin  infusion for PE. Admitted with question sepsis d/t lower extremity cellulitis, vs non-infectious SIRS multifactorial including acute dyspnea secondary to COPD and CHF exacerbation. Podiatry consulted re: venous stasis ulcers on feet - bedisde debridement, appear superficial and recs for daily dressing changes, ACE wraps to knee, po abx on dc, WBAT, f/u outpatient 1 week after discharge   12/08: Vascular surgery consult d/t decreased ABI / PE> No thrombectomy given small PE. RLE angio planned for 12/10 12/09: stable 12/10: LLE angio and stents today. Cr 1.99 12/11: renal fxn improving Cr 1.56. Vascular surgery plans on taking the patient to the vascular lab on Monday, 11/22/2024 for RLE angiogram - pt to remain on heparin  infusion until after procedure  12/12: doing well / stable.  12/13: VERY short of breath w/ minimal exertion but improves quickly w/ rest. Changed nebs, will give 1 dose IV lasix  today  12/14: good UOP w/ IV lasix , increased po dose today. Still quite SOB, wheezing. (+)influenza A --> tamiflu , postponing angio 12/15-12/16: continue tx  COPD exacerbation d/t influenza. Tentative reschedule angio for 12/18 12/17 -still short of breath but improved.  Remains on 2 L of oxygen via nasal cannula.  Unable to lay flat 12/18 -still short of breath but improved.  Right lower extremity angiogram deferred since patient is unable to lay flat.  Planned for 12/22 12/19 -complains of worsening shortness of breath.  Noted to have wheezing on exam. 12/20 - Feels better       Assessment and Plan:  LE edema / erythema  Multifactorial, include: Venous stasis dermatitis / ulceration  Cellulitis  Peripheral arterial disease LE edema d/t CHF / HFpEF exacerbation  Treat underlying cause(s) as noted    Dyspnea, multifactorial COPD likely exacerbated by influenza A infection.  Has completed a 5-day course of Tamiflu  CHF appears stable/euvolemic now post-treatment.  Continue Lasix  to every other day due to contraction alkalosis Continue bronchodilator therapy and changed to 3 times daily as well as inhaled steroids [place] O2 to maintain pulse oximetry greater than 92% Tx Flu, COPD, and CHF as noted below   COPD  Chronic hypoxic respiratory failure on 2L [employer] O2 at baseline  Exacerbated now d/t influenza A Arrives w/ prednisone  20 on med list, says has been maintained on that for a couple of months prednisone  switched back to 10 daily (12/19) --> burst 40 mg daily x 5 days given new exacerbation, then ordered back to 10 mg daily  Continue O2 supplementation at 2 L Completed course of Tamiflu  Continue schedule nebs --> budesonide  bid, duoneb TID while awake Albuterol  prn  will need close outpt pulm  f/u   Venous stasis  Venous stasis ulcers lower extremities bilaterally Question Cellulitis daily dressing changes, ACE wraps to knee Completed course of Keflex  WBAT f/u podiatry outpatient 1 week after discharge   Peripheral arterial disease  S/p angiogram RLE w/ stenting 11/17/24 Vascular surgery following Tentative reschedule  angio for 12/22    Acute PE Very small.  On apixaban  2.5 bid at baseline, patient unsure if she's been receiving regularly at snf. Given cr under 1.5 assumes stays there would warrant full dose of 5 bid heparin  for now pending vascular angio See pharmacy note 12/11 for eliquis  - test claim for eliquis  5 mg bis $85   HFpEF with acute exacerbation - now stabilized  Recent hospitalization with dose reduction in diuretic, likely under-diuresed (discharged on lasix  40, had previously been on bid dosing).  Cr up from admission after IV diuresis, but stable Continue Lasix  to 40 mg every other day due to contraction alkalosis Monitor I&O Monitor BMP    Hyperkalemia - resolved  Monitor BMP   AKI on ckd 3b - resolved  Cr 1.39 on admission --> peak at 2.09 on 12/08 --> 11/21/2024 1.29 Renal function is stable Monitor closely   History tachy-brady A-fib Rate controlled currently heparin  as primary prophylaxis for an acute stroke while Eliquis  is on hold as above Resume eliquis  on discharge as above - See pharmacy note 12/11 for eliquis  - test claim for eliquis  5 mg bid $85   Elevated troponin, ACS ruled out  Mild, stable, flat, likely demand as no chest pain Recheck troponin / EKG prn chest pain.    Neuropathy home gabapentin    Pulm nodule outpt f/u       Debility Comes from rehab PT consulted patient does not want to return to [facility]. TOC aware, looking elsewhere but if refusing available facility then will have to DC to home instead if unable to arrange alternative placement        Class 2 / borderline Class 3 obesity based on BMI:  Body mass index is 35.32kg/m. Complicates overall prognosis and care           Subjective: Patient is seen and examined at the bedside. Less short of breath today  Physical Exam: Vitals:   11/26/24 1824 11/26/24 2119 11/27/24 0516 11/27/24 0848  BP:  (!) 152/76 (!) 142/64 (!) 150/64  Pulse:   (!) 57 62  Resp:    18   Temp:  (!) 97.5 F (36.4 C) 97.6 F (36.4 C) (!) 97.5 F (36.4 C)  TempSrc:   Oral   SpO2: 94% 100% 100% 98%  Weight:      Height:       General: Chronically ill-appearing, appears comfortable and in no distress HEENT : Pale conjunctiva, anicteric sclera Cardiac:  RRR, Normal S1, S2. No murmurs. Lungs: Rales at the bases, scattered rhonchi, scattered wheezes in all lung fields Cardiovascular : RRR S1,S2  Abdomen:  Positive bowel sounds throughout, morbid obesity, soft non tender  Neurologic: AAOX3 answers all questions and follows commands.  Extremities:  All extremities are warm to touch with palpable pulses in upper extremities. Lower extremities are bandaged     Data Reviewed: Labs reviewed.  Hemoglobin 8.2 Labs reviewed  Family Communication: Plan of care was discussed with patient in detail. She verbalizes understanding and agrees with the plan  Disposition: Status is: Inpatient Remains inpatient appropriate because: For angiogram on 11/29/24  Planned Discharge Destination: Skilled nursing facility    Time spent: 7  minutes  Author: Aimee Somerset, MD 11/27/2024 12:27 PM  For on call review [email or web address]  "

## 2024-11-27 NOTE — Plan of Care (Signed)
" °  Problem: Safety: Goal: Ability to remain free from injury will improve Outcome: Progressing   Problem: Cardiac: Goal: Ability to achieve and maintain adequate cardiopulmonary perfusion will improve Outcome: Progressing   Problem: Respiratory: Goal: Ability to maintain adequate ventilation will improve Outcome: Progressing   "

## 2024-11-27 NOTE — Progress Notes (Signed)
 PHARMACY - ANTICOAGULATION CONSULT NOTE  Pharmacy Consult for IV Heparin   Indication: pulmonary embolus  Patient Measurements: Height: 5' 2 (157.5 cm) Weight: 88 kg (194 lb 0.1 oz) IBW/kg (Calculated) : 50.1 HEPARIN  DW (KG): 71.4  Labs: Recent Labs    11/25/24 0555 11/26/24 0555 11/27/24 0507  HGB 8.5* 8.9* 8.2*  HCT 29.2* 31.2* 29.2*  PLT 432* 483* 420*  HEPARINUNFRC 0.42 0.38 0.40   Estimated Creatinine Clearance: 28.3 mL/min (A) (by C-G formula based on SCr of 1.39 mg/dL (H)).  Medical History: Past Medical History:  Diagnosis Date   ACUT DUOD ULCER W/HEMORR&PERF W/O MENTION OBST 10/05/2009   NSAID induced   ALLERGIC RHINITIS CAUSE UNSPECIFIED    ANEMIA-NOS    CAD (coronary artery disease) 06/08/2009   DEs RCA with 70% LAD and EF 60%   CHF (congestive heart failure) (HCC)    COPD    mild obst on PFTs 03/2010   Diabetes mellitus 06/2010 dx   Mild, diet controlled   GLAUCOMA    HYPERLIPIDEMIA    HYPERTENSION, BENIGN    MYOCARDIAL INFARCTION 06/08/2009   des to rca   Persistent atrial fibrillation (HCC)    Dx 08/2021   TOBACCO ABUSE    Assessment: Pharmacy consulted to dose heparin  in this 89 year old female admitted with PE.  Pt was on Eliquis  2.5 mg PO BID PTA, last dose on 12/6 @ 2100.  Goal of Therapy:  Heparin  level 0.3-0.7 units/ml aPTT 66 - 102  seconds Monitor platelets by anticoagulation protocol: Yes   12/9 1623 aPTT 121s, supratherapeutic @ 1100 u/hr 12/10 0155 aPTT 117s, supratherapeutic / HL 1.01 12/10 1052 aPTT 69s, therapeutic x 1 12/10 2223 aPTT 65, HL 0.73.  12/11 0421 aPTT 81 12/11 1148 aPTT 69 12/11 1922 aPTT 101, thera x 1; 950 un/h  12/12 0406 aPTT 137, HL 0.69 12/12 1231 HL 0.82 12/12 2257 HL 0.47, therapeutic x 1 12/13 0544 HL 0.47, therapeutic x 2 12/14 0617 HL 0.49, therapeutic x 3 12/15 0623 HL 0.43, therapeutic x 4 12/16 0541 HL 0.40, therapeutic x 5 12/17 0537 HL 0.42, therapeutic x 6  12/18 0555 HL 0.42, therapeutic x  7 12/19 0555 HL 0.38, therapeutic 12/20 0507 HL 0.40, therapeutic x 9    Plan:  Will continue heparin  infusion at 750 units/hr. Recheck heparin  level daily w/ AM labs while therapeutic. CBC daily while on heparin .   Annell Canty D Clinical Pharmacist 11/27/2024 6:15 AM

## 2024-11-28 ENCOUNTER — Inpatient Hospital Stay

## 2024-11-28 DIAGNOSIS — L03116 Cellulitis of left lower limb: Secondary | ICD-10-CM | POA: Diagnosis not present

## 2024-11-28 DIAGNOSIS — L03115 Cellulitis of right lower limb: Secondary | ICD-10-CM | POA: Diagnosis not present

## 2024-11-28 LAB — CBC
HCT: 31.6 % — ABNORMAL LOW (ref 36.0–46.0)
Hemoglobin: 9.2 g/dL — ABNORMAL LOW (ref 12.0–15.0)
MCH: 24.6 pg — ABNORMAL LOW (ref 26.0–34.0)
MCHC: 29.1 g/dL — ABNORMAL LOW (ref 30.0–36.0)
MCV: 84.5 fL (ref 80.0–100.0)
Platelets: 434 K/uL — ABNORMAL HIGH (ref 150–400)
RBC: 3.74 MIL/uL — ABNORMAL LOW (ref 3.87–5.11)
RDW: 18.8 % — ABNORMAL HIGH (ref 11.5–15.5)
WBC: 13.9 K/uL — ABNORMAL HIGH (ref 4.0–10.5)
nRBC: 0.8 % — ABNORMAL HIGH (ref 0.0–0.2)

## 2024-11-28 LAB — HEPARIN LEVEL (UNFRACTIONATED)
Heparin Unfractionated: <0.1 [IU]/mL — ABNORMAL LOW (ref 0.30–0.70)
Heparin Unfractionated: 0.22 [IU]/mL — ABNORMAL LOW (ref 0.30–0.70)

## 2024-11-28 LAB — GLUCOSE, CAPILLARY
Glucose-Capillary: 109 mg/dL — ABNORMAL HIGH (ref 70–99)
Glucose-Capillary: 168 mg/dL — ABNORMAL HIGH (ref 70–99)
Glucose-Capillary: 190 mg/dL — ABNORMAL HIGH (ref 70–99)
Glucose-Capillary: 181 mg/dL — ABNORMAL HIGH (ref 70–99)

## 2024-11-28 MED ORDER — SALINE SPRAY 0.65 % NA SOLN
1.0000 | NASAL | Status: DC | PRN
  Administered 2024-11-28 – 2024-11-29 (×2): 1 via NASAL
  Filled 2024-11-28: qty 44

## 2024-11-28 MED ORDER — CEFAZOLIN SODIUM-DEXTROSE 2-4 GM/100ML-% IV SOLN
2.0000 g | INTRAVENOUS | Status: AC
  Administered 2024-11-29: 2 g via INTRAVENOUS

## 2024-11-28 MED ORDER — HEPARIN BOLUS VIA INFUSION
1000.0000 [IU] | Freq: Once | INTRAVENOUS | Status: AC
  Administered 2024-11-28: 1000 [IU] via INTRAVENOUS
  Filled 2024-11-28: qty 1000

## 2024-11-28 MED ORDER — FUROSEMIDE 10 MG/ML IJ SOLN
40.0000 mg | Freq: Every day | INTRAMUSCULAR | Status: DC
  Administered 2024-11-28 – 2024-11-30 (×3): 40 mg via INTRAVENOUS
  Filled 2024-11-28 (×3): qty 4

## 2024-11-28 NOTE — Progress Notes (Signed)
 PHARMACY - ANTICOAGULATION CONSULT NOTE  Pharmacy Consult for IV Heparin   Indication: pulmonary embolus  Patient Measurements: Height: 5' 2 (157.5 cm) Weight: 88 kg (194 lb 0.1 oz) IBW/kg (Calculated) : 50.1 HEPARIN  DW (KG): 71.4  Labs: Recent Labs    11/26/24 0555 11/27/24 0507 11/28/24 0530 11/28/24 1543  HGB 8.9* 8.2*  --  9.2*  HCT 31.2* 29.2*  --  31.6*  PLT 483* 420*  --  434*  HEPARINUNFRC 0.38 0.40 <0.10* 0.22*   Estimated Creatinine Clearance: 28.3 mL/min (A) (by C-G formula based on SCr of 1.39 mg/dL (H)).  Medical History: Past Medical History:  Diagnosis Date   ACUT DUOD ULCER W/HEMORR&PERF W/O MENTION OBST 10/05/2009   NSAID induced   ALLERGIC RHINITIS CAUSE UNSPECIFIED    ANEMIA-NOS    CAD (coronary artery disease) 06/08/2009   DEs RCA with 70% LAD and EF 60%   CHF (congestive heart failure) (HCC)    COPD    mild obst on PFTs 03/2010   Diabetes mellitus 06/2010 dx   Mild, diet controlled   GLAUCOMA    HYPERLIPIDEMIA    HYPERTENSION, BENIGN    MYOCARDIAL INFARCTION 06/08/2009   des to rca   Persistent atrial fibrillation (HCC)    Dx 08/2021   TOBACCO ABUSE    Assessment: Pharmacy consulted to dose heparin  in this 89 year old female admitted with PE.  Pt was on Eliquis  2.5 mg PO BID PTA, last dose on 12/6 @ 2100.  Goal of Therapy:  Heparin  level 0.3-0.7 units/ml aPTT 66 - 102  seconds Monitor platelets by anticoagulation protocol: Yes   12/9 1623 aPTT 121s, supratherapeutic @ 1100 u/hr 12/10 0155 aPTT 117s, supratherapeutic / HL 1.01 12/10 1052 aPTT 69s, therapeutic x 1 12/10 2223 aPTT 65, HL 0.73.  12/11 0421 aPTT 81 12/11 1148 aPTT 69 12/11 1922 aPTT 101, thera x 1; 950 un/h  12/12 0406 aPTT 137, HL 0.69 12/12 1231 HL 0.82 12/12 2257 HL 0.47, therapeutic x 1 12/13 0544 HL 0.47, therapeutic x 2 12/14 0617 HL 0.49, therapeutic x 3 12/15 0623 HL 0.43, therapeutic x 4 12/16 0541 HL 0.40, therapeutic x 5 12/17 0537 HL 0.42, therapeutic x 6   12/18 0555 HL 0.42, therapeutic x 7 12/19 0555 HL 0.38, therapeutic 12/20 0507 HL 0.40, therapeutic x 9  12/21 0530 HL <0.10, infusion was turned off for unknown reason 12/21 1543 HL 0.22   Plan:  - Heparin  Level 0.22 is SUBtherapeutic after Heparin  infusion restarted after being inadvertantly turned off - Heparin  1000 units IV bolus - Increase heparin  infusion to 900 units/hr.  - Recheck heparin  level in 8 hours.  - CBC daily while on heparin .   Olam Fritter, PharmD, BCPS 11/28/2024 4:45 PM

## 2024-11-28 NOTE — Progress Notes (Signed)
 " Progress Note   Patient: Jenny [name] FMW:[id number] DOB: 1933/04/12 DOA: 11/14/2024     14 DOS: the patient was seen and examined on 11/28/2024   Brief hospital course:  Jenny [name] is a 89 y.o. female with medical history significant for HFpEF, hypertension, persistent A-fib on Eliquis , tachybradycardia syndrome, CAD, COPD, CKD stage 3b, type 2 diabetes, anemia. Presents to ED from [facility] (rehab), 3 days LE edema and R leg pain 1 day, also SOB   12/07: to ED early hours AM. CTA chest showed tiny segmental PE left lower lobe without heart strain, extensive multivessel CAD and mild asymmetric pulmonary edema around the left hilum. Initially was treated with DuoNebs and Solu-Medrol . She initially received LR boluses but subsequently given a dose of Lasix  as test results came in. Started on vancomycin  and Rocephin  and also on a heparin  infusion for PE. Admitted with question sepsis d/t lower extremity cellulitis, vs non-infectious SIRS multifactorial including acute dyspnea secondary to COPD and CHF exacerbation. Podiatry consulted re: venous stasis ulcers on feet - bedisde debridement, appear superficial and recs for daily dressing changes, ACE wraps to knee, po abx on dc, WBAT, f/u outpatient 1 week after discharge   12/08: Vascular surgery consult d/t decreased ABI / PE> No thrombectomy given small PE. RLE angio planned for 12/10 12/09: stable 12/10: LLE angio and stents today. Cr 1.99 12/11: renal fxn improving Cr 1.56. Vascular surgery plans on taking the patient to the vascular lab on Monday, 11/22/2024 for RLE angiogram - pt to remain on heparin  infusion until after procedure  12/12: doing well / stable.  12/13: VERY short of breath w/ minimal exertion but improves quickly w/ rest. Changed nebs, will give 1 dose IV lasix  today  12/14: good UOP w/ IV lasix , increased po dose today. Still quite SOB, wheezing. (+)influenza A --> tamiflu , postponing angio 12/15-12/16: continue tx  COPD exacerbation d/t influenza. Tentative reschedule angio for 12/18 12/17 -still short of breath but improved.  Remains on 2 L of oxygen via nasal cannula.  Unable to lay flat 12/18 -still short of breath but improved.  Right lower extremity angiogram deferred since patient is unable to lay flat.  Planned for 12/22 12/19 -complains of worsening shortness of breath.  Noted to have wheezing on exam. 12/20 - Feels better 12/21 -noted to be short of breath at rest.  Had conversational dyspnea    Assessment and Plan:  LE edema / erythema  Multifactorial, include: Venous stasis dermatitis / ulceration  Cellulitis  Peripheral arterial disease LE edema d/t CHF / HFpEF exacerbation  Treat underlying cause(s) as noted    Dyspnea, multifactorial COPD likely exacerbated by influenza A infection.  Has completed a 5-day course of Tamiflu  CHF appears to be acutely exacerbated.2/21.we will switch Lasix  40 every other day to 40 IV daily.  Monitor renal function closely. Continue bronchodilator therapy and changed to 3 times daily as well as inhaled steroids [place] O2 to maintain pulse oximetry greater than 92% Tx Flu, COPD, and CHF as noted below   COPD  Chronic hypoxic respiratory failure on 2L  O2 at baseline  Exacerbated now d/t influenza A Arrives w/ prednisone  20 on med list, says has been maintained on that for a couple of months prednisone  switched back to 10 daily (12/19) --> burst 40 mg daily x 5 days given new exacerbation, then ordered back to 10 mg daily  Continue O2 supplementation at 2 L Completed course of Tamiflu  Continue schedule  nebs --> budesonide  bid, duoneb TID while awake Albuterol  prn  will need close outpt pulm f/u   Venous stasis  Venous stasis ulcers lower extremities bilaterally Question Cellulitis daily dressing changes, ACE wraps to knee Completed course of Keflex  WBAT f/u podiatry outpatient 1 week after discharge   Peripheral arterial disease  S/p  angiogram RLE w/ stenting 11/17/24 Vascular surgery following Has been on IV heparin  since Eliquis  is on hold for planned procedure Tentative reschedule angio for 12/22    Acute PE Very small.  On apixaban  2.5 bid at baseline, patient unsure if she's been receiving regularly at snf. Given cr under 1.5 assumes stays there would warrant full dose of 5 bid heparin  for now pending vascular angio See pharmacy note 12/11 for eliquis  - test claim for eliquis  5 mg bis $85   HFpEF with acute exacerbation -  Recent hospitalization with dose reduction in diuretic, likely under-diuresed (discharged on lasix  40, had previously been on bid dosing).  Cr up from admission after IV diuresis, but stable Resume Lasix  40 mg IV daily (12/21).  Was initially switched to 40 mg p.o. every other day due to contraction alkalosis but patient has worsening in her condition and chest x-ray shows findings concerning for pulmonary edema. Monitor I&O Monitor BMP    Hyperkalemia - resolved  Monitor BMP   AKI on ckd 3b - resolved  Cr 1.39 on admission --> peak at 2.09 on 12/08 --> 11/21/2024 1.29 Renal function is stable Monitor closely   History tachy-brady A-fib Rate controlled currently Has been on heparin  drip as primary prophylaxis for an acute stroke while Eliquis  is on hold as above Resume eliquis  on discharge as above - See pharmacy note 12/11 for eliquis  - test claim for eliquis  5 mg bid $85   Elevated troponin, ACS ruled out  Mild, stable, flat, likely demand as no chest pain Recheck troponin / EKG prn chest pain.    Neuropathy home gabapentin    Pulm nodule outpt f/u       Debility Comes from rehab PT consulted patient does not want to return to [facility]. TOC aware, looking elsewhere but if refusing available facility then will have to DC to home instead if unable to arrange alternative placement        Class 2 / borderline Class 3 obesity based on BMI:  Body mass index is  35.32kg/m. Complicates overall prognosis and care             Subjective: Appears short of breath at rest.  Has conversational dyspnea  Physical Exam: Vitals:   11/27/24 2010 11/28/24 0411 11/28/24 0438 11/28/24 0806  BP:  (!) 161/68  (!) 149/58  Pulse:  85  78  Resp:  18  20  Temp:  97.7 F (36.5 C)  97.9 F (36.6 C)  TempSrc:  Oral    SpO2: 100% 100%  (!) 86%  Weight:   88 kg   Height:       General: Chronically ill-appearing, appears comfortable and in no distress HEENT : Pale conjunctiva, anicteric sclera Cardiac:  RRR, Normal S1, S2. No murmurs. Lungs: Rales at the bases, scattered rhonchi, scattered wheezes in all lung fields Cardiovascular : RRR S1,S2  Abdomen:  Positive bowel sounds throughout, morbid obesity, soft non tender  Neurologic: AAOX3 answers all questions and follows commands.  Extremities:  All extremities are warm to touch with palpable pulses in upper extremities. Lower extremities are bandaged    Data Reviewed:  There are  no new results to review at this time.  Family Communication: Plan of care was discussed with patient in detail at the bedside.  She verbalizes understanding and agrees with the plan.  Disposition: Status is: Inpatient Remains inpatient appropriate because: For lower extremity angiogram in a.m. if stable  Planned Discharge Destination: Skilled nursing facility    Time spent: 55 minutes  Author: Aimee Somerset, MD 11/28/2024 1:39 PM  For on call review [email or web address].  "

## 2024-11-28 NOTE — Progress Notes (Signed)
 PT Cancellation Note  Patient Details Name: Jenny [name] MRN: [id number] DOB: 10-Feb-1933   Cancelled Treatment:    Reason Eval/Treat Not Completed: Fatigue/lethargy limiting ability to participate  Offered session this am but she stated she was too fatigued and had a rough night  Asked if I could return later.  Returned later this pm and pt resting soundly.  Did not awaken to voice.  Decision to let pt rest and will return at a later date.   Lauraine Gills 11/28/2024, 3:48 PM

## 2024-11-28 NOTE — Plan of Care (Signed)

## 2024-11-29 ENCOUNTER — Encounter
Admission: EM | Disposition: A | Discharge status: Skilled Nursing Facility | Payer: Self-pay | Source: Home / Self Care | Attending: Internal Medicine

## 2024-11-29 ENCOUNTER — Encounter: Payer: Self-pay | Admitting: Vascular Surgery

## 2024-11-29 ENCOUNTER — Ambulatory Visit: Payer: Self-pay | Admitting: Pharmacy Technician

## 2024-11-29 DIAGNOSIS — L03115 Cellulitis of right lower limb: Secondary | ICD-10-CM | POA: Diagnosis not present

## 2024-11-29 DIAGNOSIS — L97929 Non-pressure chronic ulcer of unspecified part of left lower leg with unspecified severity: Secondary | ICD-10-CM

## 2024-11-29 DIAGNOSIS — I70249 Atherosclerosis of native arteries of left leg with ulceration of unspecified site: Secondary | ICD-10-CM

## 2024-11-29 DIAGNOSIS — L03116 Cellulitis of left lower limb: Secondary | ICD-10-CM | POA: Diagnosis not present

## 2024-11-29 HISTORY — PX: LOWER EXTREMITY ANGIOGRAPHY: CATH118251

## 2024-11-29 LAB — CBC
HCT: 29 % — ABNORMAL LOW (ref 36.0–46.0)
Hemoglobin: 8.5 g/dL — ABNORMAL LOW (ref 12.0–15.0)
MCH: 24.9 pg — ABNORMAL LOW (ref 26.0–34.0)
MCHC: 29.3 g/dL — ABNORMAL LOW (ref 30.0–36.0)
MCV: 84.8 fL (ref 80.0–100.0)
Platelets: 432 K/uL — ABNORMAL HIGH (ref 150–400)
RBC: 3.42 MIL/uL — ABNORMAL LOW (ref 3.87–5.11)
RDW: 18.6 % — ABNORMAL HIGH (ref 11.5–15.5)
WBC: 10.6 K/uL — ABNORMAL HIGH (ref 4.0–10.5)
nRBC: 0.8 % — ABNORMAL HIGH (ref 0.0–0.2)

## 2024-11-29 LAB — BASIC METABOLIC PANEL WITH GFR
Anion gap: 9 (ref 5–15)
BUN: 32 mg/dL — ABNORMAL HIGH (ref 8–23)
CO2: 38 mmol/L — ABNORMAL HIGH (ref 22–32)
Calcium: 8.3 mg/dL — ABNORMAL LOW (ref 8.9–10.3)
Chloride: 93 mmol/L — ABNORMAL LOW (ref 98–111)
Creatinine, Ser: 1.3 mg/dL — ABNORMAL HIGH (ref 0.44–1.00)
GFR, Estimated: 39 mL/min — ABNORMAL LOW
Glucose, Bld: 224 mg/dL — ABNORMAL HIGH (ref 70–99)
Potassium: 3.8 mmol/L (ref 3.5–5.1)
Sodium: 141 mmol/L (ref 135–145)

## 2024-11-29 LAB — GLUCOSE, CAPILLARY
Glucose-Capillary: 111 mg/dL — ABNORMAL HIGH (ref 70–99)
Glucose-Capillary: 186 mg/dL — ABNORMAL HIGH (ref 70–99)
Glucose-Capillary: 150 mg/dL — ABNORMAL HIGH (ref 70–99)
Glucose-Capillary: 146 mg/dL — ABNORMAL HIGH (ref 70–99)
Glucose-Capillary: 142 mg/dL — ABNORMAL HIGH (ref 70–99)

## 2024-11-29 LAB — MAGNESIUM: Magnesium: 2.2 mg/dL (ref 1.7–2.4)

## 2024-11-29 LAB — HEPARIN LEVEL (UNFRACTIONATED)
Heparin Unfractionated: 0.37 [IU]/mL (ref 0.30–0.70)
Heparin Unfractionated: 0.1 [IU]/mL — ABNORMAL LOW (ref 0.30–0.70)

## 2024-11-29 MED ORDER — CLOPIDOGREL BISULFATE 75 MG PO TABS: 75.0000 mg | ORAL_TABLET | Freq: Every day | ORAL | Status: DC

## 2024-11-29 MED ORDER — CEFAZOLIN SODIUM-DEXTROSE 2-4 GM/100ML-% IV SOLN
INTRAVENOUS | Status: AC
  Filled 2024-11-29: qty 100

## 2024-11-29 MED ORDER — IPRATROPIUM-ALBUTEROL 0.5-2.5 (3) MG/3ML IN SOLN
RESPIRATORY_TRACT | Status: AC
  Filled 2024-11-29: qty 3

## 2024-11-29 MED ORDER — IPRATROPIUM-ALBUTEROL 0.5-2.5 (3) MG/3ML IN SOLN
3.0000 mL | Freq: Once | RESPIRATORY_TRACT | Status: AC
  Administered 2024-11-29: 3 mL via RESPIRATORY_TRACT

## 2024-11-29 MED ORDER — HEPARIN (PORCINE) IN NACL 1000-0.9 UT/500ML-% IV SOLN
INTRAVENOUS | Status: DC | PRN
  Administered 2024-11-29: 1000 mL

## 2024-11-29 MED ORDER — HEPARIN SODIUM (PORCINE) 1000 UNIT/ML IJ SOLN
INTRAMUSCULAR | Status: AC
  Filled 2024-11-29: qty 10

## 2024-11-29 MED ORDER — FENTANYL CITRATE (PF) 100 MCG/2ML IJ SOLN
INTRAMUSCULAR | Status: AC
  Filled 2024-11-29: qty 2

## 2024-11-29 MED ORDER — LIDOCAINE-EPINEPHRINE (PF) 1 %-1:200000 IJ SOLN
INTRAMUSCULAR | Status: DC | PRN
  Administered 2024-11-29: 10 mL

## 2024-11-29 MED ORDER — IODIXANOL 320 MG/ML IV SOLN
INTRAVENOUS | Status: DC | PRN
  Administered 2024-11-29: 55 mL

## 2024-11-29 MED ORDER — MIDAZOLAM HCL 5 MG/5ML IJ SOLN
INTRAMUSCULAR | Status: AC
  Filled 2024-11-29: qty 5

## 2024-11-29 MED ORDER — MIDAZOLAM HCL (PF) 2 MG/2ML IJ SOLN
INTRAMUSCULAR | Status: DC | PRN
  Administered 2024-11-29: 1 mg via INTRAVENOUS
  Administered 2024-11-29: .5 mg via INTRAVENOUS

## 2024-11-29 MED ORDER — FENTANYL CITRATE (PF) 100 MCG/2ML IJ SOLN
INTRAMUSCULAR | Status: DC | PRN
  Administered 2024-11-29: 50 ug via INTRAVENOUS
  Administered 2024-11-29: 12.5 ug via INTRAVENOUS

## 2024-11-29 MED ORDER — HEPARIN SODIUM (PORCINE) 1000 UNIT/ML IJ SOLN
INTRAMUSCULAR | Status: DC | PRN
  Administered 2024-11-29: 4000 [IU] via INTRAVENOUS

## 2024-11-29 MED ORDER — APIXABAN 2.5 MG PO TABS
2.5000 mg | ORAL_TABLET | Freq: Two times a day (BID) | ORAL | Status: DC
  Administered 2024-11-29 – 2024-12-01 (×4): 2.5 mg via ORAL
  Filled 2024-11-29 (×4): qty 1

## 2024-11-29 NOTE — Progress Notes (Signed)
 PHARMACY - ANTICOAGULATION CONSULT NOTE  Pharmacy Consult for IV Heparin   Indication: pulmonary embolus  Patient Measurements: Height: 5' 2 (157.5 cm) Weight: 88 kg (194 lb 0.1 oz) IBW/kg (Calculated) : 50.1 HEPARIN  DW (KG): 71.4  Labs: Recent Labs    11/27/24 0507 11/28/24 0530 11/28/24 1543 11/29/24 0101  HGB 8.2*  --  9.2* 8.5*  HCT 29.2*  --  31.6* 29.0*  PLT 420*  --  434* 432*  HEPARINUNFRC 0.40 <0.10* 0.22* 0.37  CREATININE  --   --   --  1.30*   Estimated Creatinine Clearance: 30.2 mL/min (A) (by C-G formula based on SCr of 1.3 mg/dL (H)).  Medical History: Past Medical History:  Diagnosis Date   ACUT DUOD ULCER W/HEMORR&PERF W/O MENTION OBST 10/05/2009   NSAID induced   ALLERGIC RHINITIS CAUSE UNSPECIFIED    ANEMIA-NOS    CAD (coronary artery disease) 06/08/2009   DEs RCA with 70% LAD and EF 60%   CHF (congestive heart failure) (HCC)    COPD    mild obst on PFTs 03/2010   Diabetes mellitus 06/2010 dx   Mild, diet controlled   GLAUCOMA    HYPERLIPIDEMIA    HYPERTENSION, BENIGN    MYOCARDIAL INFARCTION 06/08/2009   des to rca   Persistent atrial fibrillation (HCC)    Dx 08/2021   TOBACCO ABUSE    Assessment: Pharmacy consulted to dose heparin  in this 89 year old female admitted with PE.  Pt was on Eliquis  2.5 mg PO BID PTA, last dose on 12/6 @ 2100.  Goal of Therapy:  Heparin  level 0.3-0.7 units/ml aPTT 66 - 102  seconds Monitor platelets by anticoagulation protocol: Yes   12/9 1623 aPTT 121s, supratherapeutic @ 1100 u/hr 12/10 0155 aPTT 117s, supratherapeutic / HL 1.01 12/10 1052 aPTT 69s, therapeutic x 1 12/10 2223 aPTT 65, HL 0.73.  12/11 0421 aPTT 81 12/11 1148 aPTT 69 12/11 1922 aPTT 101, thera x 1; 950 un/h  12/12 0406 aPTT 137, HL 0.69 12/12 1231 HL 0.82 12/12 2257 HL 0.47, therapeutic x 1 12/13 0544 HL 0.47, therapeutic x 2 12/14 0617 HL 0.49, therapeutic x 3 12/15 0623 HL 0.43, therapeutic x 4 12/16 0541 HL 0.40, therapeutic x  5 12/17 0537 HL 0.42, therapeutic x 6  12/18 0555 HL 0.42, therapeutic x 7 12/19 0555 HL 0.38, therapeutic 12/20 0507 HL 0.40, therapeutic x 9  12/21 0530 HL <0.10, infusion was turned off for unknown reason 12/21 1543 HL 0.22   Plan:  12/22:  HL @ 0101 = 0.37, therapeutic X 1 following restart - will continue pt on current rate and recheck HL in 8 hrs - CBC daily while on heparin .   Jonna Dittrich D 11/29/2024 2:58 AM

## 2024-11-29 NOTE — Progress Notes (Signed)
 OT Cancellation Note  Patient Details Name: SHENAYA LEBO MRN: [id number] DOB: 1933-01-11   Cancelled Treatment:    Reason Eval/Treat Not Completed: Other (comment). Chart reviewed, treatment attempted. Pt had LLE angiogram this date, reports continued grogginess and fatigue upon OT entry and declined ADL performance, bed level exercises or transfers at this time. OT will cont to follow POC.  Kadeshia Kasparian E Chrismon 11/29/2024, 3:06 PM

## 2024-11-29 NOTE — TOC Progression Note (Signed)
 Transition of Care [facility]) - Progression Note    Patient Details  Name: Jenny [name] MRN: [id number] Date of Birth: September 10, 1933  [facility]) CM/SW Contact  Victory Jackquline RAMAN, RN Phone Number: 11/29/2024, 2:22 PM  Clinical Narrative:    1:14 pm: RNCM received a message from MD via secure chat informing me that the patient had her lower extremity angiogram done today and for me to start working on discharge planning for the patient. Patient is not medically stable for discharge today.   1:30 pm: RNCM called [facility] @ [phone] and spoke to Taya the admissions coordinator and she said that they are holding a bed for her. MD made aware.   1:36 pm: RNCM received a message from bedside nurse informing me that the patient's son had some questions for me. RNCM called patient's vernadine Sharper @ [phone], introduced myself and my role. Som wanted to know what the plans were for his mom and if [facility] still had a bed for his mom. RNCM informed the son that I spoke with Taya at [facility] and they are holding a bed for her and that the MD said that the patient may be discharged tomorrow. He verbalized understanding. RNCM will continue to follow for discharge planning needs.    Expected Discharge Plan: Skilled Nursing Facility Barriers to Discharge: Continued Medical Work up               Expected Discharge Plan and Services     Post Acute Care Choice: Skilled Nursing Facility Living arrangements for the past 2 months: Skilled Nursing Facility, Single Family Home                                       Social Drivers of Health (SDOH) Interventions SDOH Screenings   Food Insecurity: No Food Insecurity (11/15/2024)  Housing: Low Risk (11/15/2024)  Transportation Needs: No Transportation Needs (11/15/2024)  Utilities: Not At Risk (11/15/2024)  Alcohol  Screen: Low Risk (07/02/2024)  Depression (PHQ2-9): Low Risk (07/22/2022)  Financial Resource Strain: Low  Risk (07/02/2024)  Social Connections: Socially Isolated (11/15/2024)  Tobacco Use: Medium Risk (11/15/2024)    Readmission Risk Interventions    07/05/2024   10:24 AM  Readmission Risk Prevention Plan  Transportation Screening Complete  HRI or Home Care Consult Complete  Social Work Consult for Recovery Care Planning/Counseling Complete  Palliative Care Screening Not Applicable  Medication Review Oceanographer) Complete

## 2024-11-29 NOTE — Progress Notes (Signed)
" °   11/29/24 1410  Spiritual Encounters  Type of Visit Follow up  Care provided to: Pt and family  Conversation partners present during encounter Nurse  Referral source Family  Reason for visit Routine spiritual support  OnCall Visit No  Interventions  Spiritual Care Interventions Made Established relationship of care and support;Compassionate presence;Reflective listening;Decision-making support/facilitation;Reconciliation with self/others;Meaning making   Chaplain made a follow-up visit to patient in response to spiritual consult request.  Patient was somewhat groggy from procedure earlier in the day.  Chaplain had a lengthy conversation with patient's son regarding after-discharge plans and reiterated his reason for his request for a daily visit with a chaplain.  Patient has concerns and questions about afterlife, and patient would like continued compassionate and comforting conversation throughout her hospital stay.   "

## 2024-11-29 NOTE — H&P (View-Only) (Signed)
 " Progress Note    11/29/2024 10:16 AM * Day of Surgery *  Subjective:  Jenny [name] is an 89 yo female who is now POD #7 from:   Date of Surgery: 11/17/2024   Surgeon(s):DEW,JASON     Assistants:none   Pre-operative Diagnosis: PAD with ulceration of the right lower extremity   Post-operative diagnosis:  Same   Procedure(s) Performed:             1.  Ultrasound guidance for vascular access left femoral artery             2.  Catheter placement into right common femoral artery from left femoral approach             3.  Aortogram and selective right lower extremity angiogram             4.  Percutaneous transluminal angioplasty of right distal SFA with 5 mm diameter Lutonix drug-coated angioplasty balloon             5.  Stent placement to the right distal SFA with 6 mm diameter by 10 cm length life stent             6.  Stent placement to the right external iliac artery with 8 mm diameter by 8 cm length life star stent             7.  Celt closure device left femoral artery   EBL: 5 cc   Contrast: 50 cc   Patient is seen comfortably in bed this morning. She endorses that her left leg feels much better slowly. She endorses she continues to have the same pain and problems with her right leg. She is insisting that we investigate and evaluate her right lower extremity pain. Patient continues to be SOB due to her chronic COPD. Endorses she wants to attempt the procedure today. Feels she will be able to do it today.  No complaints overnight vitals all remained stable. `   Vitals:   11/29/24 0754 11/29/24 0956  BP: 139/67 123/87  Pulse: 89 95  Resp: 20 (!) 24  Temp: 97.6 F (36.4 C) (!) 97.2 F (36.2 C)  SpO2: 100% 100%   Physical Exam: Cardiac:  RRR, Normal S1, S2. No murmurs. Lungs:  Positive labored breathing due to her COPD with 2 liters [place] oxygen. Positive Rhonchi throughout. No rales but diminished breath sounds in the bases.  Incisions: Left Femoral Artery Puncture site  with dressing clean dry and intact. No S&S of hematoma, seroma or infection to note.  Extremities:  All extremities are warm to touch with palpable pulses in upper extremities. Lower extremities are bandaged but assessment of toes being purple. Only able to get doppler DP/PT pulses and they are weak  Abdomen:  Positive bowel sounds throughout, morbid obesity, soft non tender  Neurologic: AAOX3 answers all questions and follows commands.   CBC    Component Value Date/Time   WBC 10.6 (H) 11/29/2024 0101   RBC 3.42 (L) 11/29/2024 0101   HGB 8.5 (L) 11/29/2024 0101   HCT 29.0 (L) 11/29/2024 0101   PLT 432 (H) 11/29/2024 0101   MCV 84.8 11/29/2024 0101   MCH 24.9 (L) 11/29/2024 0101   MCHC 29.3 (L) 11/29/2024 0101   RDW 18.6 (H) 11/29/2024 0101   LYMPHSABS 0.3 (L) 11/14/2024 0022   MONOABS 0.7 11/14/2024 0022   EOSABS 0.0 11/14/2024 0022   BASOSABS 0.0 11/14/2024 0022    BMET    Component  Value Date/Time   NA 141 11/29/2024 0101   NA 139 10/21/2024 1046   K 3.8 11/29/2024 0101   CL 93 (L) 11/29/2024 0101   CO2 38 (H) 11/29/2024 0101   GLUCOSE 224 (H) 11/29/2024 0101   BUN 32 (H) 11/29/2024 0101   BUN 48 (H) 10/21/2024 1046   CREATININE 1.30 (H) 11/29/2024 0101   CALCIUM  8.3 (L) 11/29/2024 0101   GFRNONAA 39 (L) 11/29/2024 0101   GFRAA  10/09/2009 0350    >60        The eGFR has been calculated using the MDRD equation. This calculation has not been validated in all clinical situations. eGFR's persistently <60 mL/min signify possible Chronic Kidney Disease.    INR    Component Value Date/Time   INR 1.3 (H) 11/14/2024 0354     Intake/Output Summary (Last 24 hours) at 11/29/2024 1016 Last data filed at 11/29/2024 0615 Gross per 24 hour  Intake 420 ml  Output 1850 ml  Net -1430 ml     Assessment/Plan:  89 y.o. female is s/p SEE ABOVE * Day of Surgery *   PLAN Patient agrees to proceed with angiogram this morning.  States she feels much better and would like a  DuoNeb prior to procedure to help her breathe better.  She remains on oxygen she always have this.  She will require at least 2 L during the procedure.  No other complaints.  Vitals all remained stable.  Vascular lab was notified in person of the plan after the patient was seen this morning.   DVT prophylaxis: Heparin  infusion   Jenny [name] [facility] 11/29/2024 10:16 AM   "

## 2024-11-29 NOTE — Interval H&P Note (Signed)
 History and Physical Interval Note:  11/29/2024 10:21 AM  Jenny [name]  has presented today for surgery, with the diagnosis of PAD.  The various methods of treatment have been discussed with the patient and family. After consideration of risks, benefits and other options for treatment, the patient has consented to  Procedures: Lower Extremity Angiography (Left) as a surgical intervention.  The patient's history has been reviewed, patient examined, no change in status, stable for surgery.  I have reviewed the patient's chart and labs.  Questions were answered to the patient's satisfaction.     Rykker Coviello

## 2024-11-29 NOTE — Progress Notes (Signed)
 " Progress Note    11/29/2024 10:16 AM * Day of Surgery *  Subjective:  Jenny [name] is an 89 yo female who is now POD #7 from:   Date of Surgery: 11/17/2024   Surgeon(s):DEW,JASON     Assistants:none   Pre-operative Diagnosis: PAD with ulceration of the right lower extremity   Post-operative diagnosis:  Same   Procedure(s) Performed:             1.  Ultrasound guidance for vascular access left femoral artery             2.  Catheter placement into right common femoral artery from left femoral approach             3.  Aortogram and selective right lower extremity angiogram             4.  Percutaneous transluminal angioplasty of right distal SFA with 5 mm diameter Lutonix drug-coated angioplasty balloon             5.  Stent placement to the right distal SFA with 6 mm diameter by 10 cm length life stent             6.  Stent placement to the right external iliac artery with 8 mm diameter by 8 cm length life star stent             7.  Celt closure device left femoral artery   EBL: 5 cc   Contrast: 50 cc   Patient is seen comfortably in bed this morning. She endorses that her left leg feels much better slowly. She endorses she continues to have the same pain and problems with her right leg. She is insisting that we investigate and evaluate her right lower extremity pain. Patient continues to be SOB due to her chronic COPD. Endorses she wants to attempt the procedure today. Feels she will be able to do it today.  No complaints overnight vitals all remained stable. `   Vitals:   11/29/24 0754 11/29/24 0956  BP: 139/67 123/87  Pulse: 89 95  Resp: 20 (!) 24  Temp: 97.6 F (36.4 C) (!) 97.2 F (36.2 C)  SpO2: 100% 100%   Physical Exam: Cardiac:  RRR, Normal S1, S2. No murmurs. Lungs:  Positive labored breathing due to her COPD with 2 liters [place] oxygen. Positive Rhonchi throughout. No rales but diminished breath sounds in the bases.  Incisions: Left Femoral Artery Puncture site  with dressing clean dry and intact. No S&S of hematoma, seroma or infection to note.  Extremities:  All extremities are warm to touch with palpable pulses in upper extremities. Lower extremities are bandaged but assessment of toes being purple. Only able to get doppler DP/PT pulses and they are weak  Abdomen:  Positive bowel sounds throughout, morbid obesity, soft non tender  Neurologic: AAOX3 answers all questions and follows commands.   CBC    Component Value Date/Time   WBC 10.6 (H) 11/29/2024 0101   RBC 3.42 (L) 11/29/2024 0101   HGB 8.5 (L) 11/29/2024 0101   HCT 29.0 (L) 11/29/2024 0101   PLT 432 (H) 11/29/2024 0101   MCV 84.8 11/29/2024 0101   MCH 24.9 (L) 11/29/2024 0101   MCHC 29.3 (L) 11/29/2024 0101   RDW 18.6 (H) 11/29/2024 0101   LYMPHSABS 0.3 (L) 11/14/2024 0022   MONOABS 0.7 11/14/2024 0022   EOSABS 0.0 11/14/2024 0022   BASOSABS 0.0 11/14/2024 0022    BMET    Component  Value Date/Time   NA 141 11/29/2024 0101   NA 139 10/21/2024 1046   K 3.8 11/29/2024 0101   CL 93 (L) 11/29/2024 0101   CO2 38 (H) 11/29/2024 0101   GLUCOSE 224 (H) 11/29/2024 0101   BUN 32 (H) 11/29/2024 0101   BUN 48 (H) 10/21/2024 1046   CREATININE 1.30 (H) 11/29/2024 0101   CALCIUM  8.3 (L) 11/29/2024 0101   GFRNONAA 39 (L) 11/29/2024 0101   GFRAA  10/09/2009 0350    >60        The eGFR has been calculated using the MDRD equation. This calculation has not been validated in all clinical situations. eGFR's persistently <60 mL/min signify possible Chronic Kidney Disease.    INR    Component Value Date/Time   INR 1.3 (H) 11/14/2024 0354     Intake/Output Summary (Last 24 hours) at 11/29/2024 1016 Last data filed at 11/29/2024 0615 Gross per 24 hour  Intake 420 ml  Output 1850 ml  Net -1430 ml     Assessment/Plan:  89 y.o. female is s/p SEE ABOVE * Day of Surgery *   PLAN Patient agrees to proceed with angiogram this morning.  States she feels much better and would like a  DuoNeb prior to procedure to help her breathe better.  She remains on oxygen she always have this.  She will require at least 2 L during the procedure.  No other complaints.  Vitals all remained stable.  Vascular lab was notified in person of the plan after the patient was seen this morning.   DVT prophylaxis: Heparin  infusion   Jenny [name] [facility] 11/29/2024 10:16 AM   "

## 2024-11-29 NOTE — Op Note (Signed)
 [facility]  Percutaneous Study/Intervention Procedural Note   Date of Surgery: 11/29/2024  Surgeon(s):Taylynn Easton    Assistants:none  Pre-operative Diagnosis: PAD with ulceration LLE  Post-operative diagnosis:  Same  Procedure(s) Performed:             1.  Ultrasound guidance for vascular access right femoral artery             2.  Catheter placement into left common femoral artery from right femoral approach             3.  Aortogram and selective left lower extremity angiogram             4.  Percutaneous transluminal angioplasty of left common femoral artery with a 6 mm diameter ultra score balloon and a 6 mm diameter Lutonix drug-coated balloon             5.  StarClose closure device right femoral artery  EBL: 5 cc  Contrast: 55 cc  Fluoro Time: 2.5 minutes  Moderate Conscious Sedation Time: approximately 35 minutes using 1.5 mg of Versed  and 62.5 mcg of Fentanyl               Indications:  Patient is a 89 y.o.female with nonhealing ulcerations of both lower extremities having already undergone right lower extremity revascularization.  The patient is brought in for angiography for further evaluation and potential treatment.  Due to the limb threatening nature of the situation, angiogram was performed for attempted limb salvage. The patient is aware that if the procedure fails, amputation would be expected.  The patient also understands that even with successful revascularization, amputation may still be required due to the severity of the situation.  Risks and benefits are discussed and informed consent is obtained.   Procedure:  The patient was identified and appropriate procedural time out was performed.  The patient was then placed supine on the table and prepped and draped in the usual sterile fashion. Moderate conscious sedation was administered during a face to face encounter with the patient throughout the procedure with my supervision of the RN  administering medicines and monitoring the patient's vital signs, pulse oximetry, telemetry and mental status throughout from the start of the procedure until the patient was taken to the recovery room. Ultrasound was used to evaluate the right common femoral artery.  It was patent .  A digital ultrasound image was acquired.  A Seldinger needle was used to access the right common femoral artery under direct ultrasound guidance and a permanent image was performed.  A 0.035 J wire was advanced without resistance and a 5Fr sheath was placed.  Pigtail catheter was placed into the aorta and an AP aortogram was performed. This demonstrated normal renal arteries and normal aorta and iliac segments without significant stenosis after previous stenting of the right external iliac artery. I then crossed the aortic bifurcation and advanced to the left femoral head. Selective left lower extremity angiogram was then performed. This demonstrated a fairly focal napkin ring like stenosis in the left common femoral artery just above the previously placed Celt closure device.  This appeared to create at least 60 to 70% stenosis.  The SFA and popliteal arteries were patent with only mild disease.  There was two-vessel runoff distally without focal tibial stenosis in these vessels. It was felt that it was in the patient's best interest to proceed with intervention after these images to avoid a second procedure and a larger amount of contrast and  fluoroscopy based off of the findings from the initial angiogram.  Even although this was a common femoral lesion and these are often treated with surgical therapy, she is an exceptionally poor surgical candidate and I felt endovascular revascularization would be her only option.  The patient was systemically heparinized and a 6 French Ansell sheath was then placed over the [employer] wire. I then used a Kumpe catheter and the advantage wire to easily cross the lesion in the common femoral  artery and parked the wire down to the distal SFA.  I then performed angioplasty of the left common femoral artery with a 6 mm diameter by 4 cm length ultra score balloon inflated to 6 atm for 1 minute.  Following this, a 6 mm diameter by 6 cm length Lutonix drug-coated angioplasty balloon was also inflated to 6 atm for 1 minute.  Completion imaging showed marked improvement with less than 10% residual stenosis in this focal common femoral lesion after angioplasty. I elected to terminate the procedure. The sheath was removed and StarClose closure device was deployed in the right femoral artery with excellent hemostatic result. The patient was taken to the recovery room in stable condition having tolerated the procedure well.  Findings:               Aortogram:  This demonstrated normal renal arteries and normal aorta and iliac segments without significant stenosis after previous stenting of the right external iliac artery.             Left Lower Extremity:  This demonstrated a fairly focal napkin ring like stenosis in the left common femoral artery just above the previously placed Celt closure device.  This appeared to create at least 60 to 70% stenosis.  The SFA and popliteal arteries were patent with only mild disease.  There was two-vessel runoff distally without focal tibial stenosis in these vessels   Disposition: Patient was taken to the recovery room in stable condition having tolerated the procedure well.  Complications: None  Selinda Gu 11/29/2024 11:44 AM   This note was created with Dragon Medical transcription system. Any errors in dictation are purely unintentional.

## 2024-11-29 NOTE — Progress Notes (Signed)
 " Progress Note   Patient: Jenny [name] FMW:[id number] DOB: 21-Jun-1933 DOA: 11/14/2024     15 DOS: the patient was seen and examined on 11/29/2024   Brief hospital course:  Jenny [name] is a 89 y.o. female with medical history significant for HFpEF, hypertension, persistent A-fib on Eliquis , tachybradycardia syndrome, CAD, COPD, CKD stage 3b, type 2 diabetes, anemia. Presents to ED from [facility] (rehab), 3 days LE edema and R leg pain 1 day, also SOB   12/07: to ED early hours AM. CTA chest showed tiny segmental PE left lower lobe without heart strain, extensive multivessel CAD and mild asymmetric pulmonary edema around the left hilum. Initially was treated with DuoNebs and Solu-Medrol . She initially received LR boluses but subsequently given a dose of Lasix  as test results came in. Started on vancomycin  and Rocephin  and also on a heparin  infusion for PE. Admitted with question sepsis d/t lower extremity cellulitis, vs non-infectious SIRS multifactorial including acute dyspnea secondary to COPD and CHF exacerbation. Podiatry consulted re: venous stasis ulcers on feet - bedisde debridement, appear superficial and recs for daily dressing changes, ACE wraps to knee, po abx on dc, WBAT, f/u outpatient 1 week after discharge   12/08: Vascular surgery consult d/t decreased ABI / PE> No thrombectomy given small PE. RLE angio planned for 12/10 12/09: stable 12/10: LLE angio and stents today. Cr 1.99 12/11: renal fxn improving Cr 1.56. Vascular surgery plans on taking the patient to the vascular lab on Monday, 11/22/2024 for RLE angiogram - pt to remain on heparin  infusion until after procedure  12/12: doing well / stable.  12/13: VERY short of breath w/ minimal exertion but improves quickly w/ rest. Changed nebs, will give 1 dose IV lasix  today  12/14: good UOP w/ IV lasix , increased po dose today. Still quite SOB, wheezing. (+)influenza A --> tamiflu , postponing angio 12/15-12/16: continue tx  COPD exacerbation d/t influenza. Tentative reschedule angio for 12/18 12/17 -still short of breath but improved.  Remains on 2 L of oxygen via nasal cannula.  Unable to lay flat 12/18 -still short of breath but improved.  Right lower extremity angiogram deferred since patient is unable to lay flat.  Planned for 12/22 12/19 -complains of worsening shortness of breath.  Noted to have wheezing on exam. 12/20 - Feels better 12/21 -noted to be short of breath at rest.  Had conversational dyspnea 12/22 -appears comfortable and in no distress.  Scheduled for lower extremity angiogram today          Assessment and Plan:  LE edema / erythema  Multifactorial, include: Venous stasis dermatitis / ulceration  Cellulitis  Peripheral arterial disease LE edema d/t CHF / HFpEF exacerbation  Treat underlying cause(s) as noted    Dyspnea, multifactorial COPD likely exacerbated by influenza A infection.  Has completed a 5-day course of Tamiflu  CHF appeared to be acutely exacerbate, .2/21. Continue Lasix  40 IV daily .  Monitor renal function closely. Continue bronchodilator therapy and changed to 3 times daily as well as inhaled steroids [place] O2 to maintain pulse oximetry greater than 92% Tx Flu, COPD, and CHF as noted below    COPD  Chronic hypoxic respiratory failure on 2L  O2 at baseline  Exacerbated now d/t influenza A Arrives w/ prednisone  20 on med list, says has been maintained on that for a couple of months prednisone  switched back to 10 daily (12/19) --> burst 40 mg daily x 5 days given new exacerbation, then ordered back  to 10 mg daily  Continue O2 supplementation at 2 L Completed course of Tamiflu  Continue schedule nebs --> budesonide  bid, duoneb TID while awake Albuterol  prn  will need close outpt pulm f/u   Venous stasis  Venous stasis ulcers lower extremities bilaterally Question Cellulitis daily dressing changes, ACE wraps to knee Completed course of Keflex  WBAT f/u  podiatry outpatient 1 week after discharge   Peripheral arterial disease  S/p angiogram RLE w/ stenting 11/17/24 Vascular surgery following Status post left lower extremity percutaneous transluminal angioplasty of left common femoral artery Will discontinue heparin  and resume Eliquis    Acute PE Very small.  Continue Eliquis  2.5 mg twice daily  HFpEF with acute exacerbation - Last known LVEF of 60 to 65% with no regional wall motion abnormality and moderate asymmetric LVH of the basal septal segment, 2D echocardiogram which was done 07/25 Continue Lasix  40 mg IV daily for 24 to 48 hours and switch to oral Monitor renal function closely   Hyperkalemia - resolved  Monitor BMP   AKI on ckd 3b - resolved  Cr 1.39 on admission --> peak at 2.09 on 12/08 --> 11/21/2024 1.29 Renal function is stable Monitor closely   History tachy-brady A-fib Rate controlled currently Resume Eliquis  2.5 mg twice daily   Elevated troponin, ACS ruled out  Mild, stable, flat, likely demand as no chest pain Recheck troponin / EKG prn chest pain.    Neuropathy home gabapentin    Pulm nodule outpt f/u       Debility Comes from rehab PT consulted patient does not want to return to [facility]. TOC aware, looking elsewhere but if refusing available facility then will have to DC to home instead if unable to arrange alternative placement        Class 2 / borderline Class 3 obesity based on BMI:  Body mass index is 35.32kg/m. Complicates overall prognosis and care             Subjective: Appears comfortable in bed and in no distress.  Physical Exam: Vitals:   11/29/24 1159 11/29/24 1215 11/29/24 1230 11/29/24 1235  BP: 134/67 134/67 134/82   Pulse: 90 95 (!) 101 99  Resp:  (!) 26 (!) 21 (!) 27  Temp:      TempSrc:      SpO2: (!) 87% (!) 87% 98% 97%  Weight:      Height:       General: Chronically ill-appearing, appears comfortable and in no distress HEENT : Pale  conjunctiva, anicteric sclera Cardiac:  RRR, Normal S1, S2. No murmurs. Lungs: Rales at the bases, scattered rhonchi, scattered wheezes in all lung fields Cardiovascular : RRR S1,S2  Abdomen:  Positive bowel sounds throughout, morbid obesity, soft non tender  Neurologic: AAOX3 answers all questions and follows commands.  Extremities:  All extremities are warm to touch with palpable pulses in upper extremities. Lower extremities are bandaged      Data Reviewed: Labs reviewed.  BUN 32, creatinine 1.30, white count 7.6, hemoglobin 8.5 Labs reviewed.  Family Communication: Plan of care was discussed with patient in detail. She verbalizes understanding and agrees with the plan.  Disposition: Status is: Inpatient Remains inpatient appropriate because: Discharge planning  Planned Discharge Destination: Skilled nursing facility    Time spent: 50 minutes  Author: Aimee Somerset, MD 11/29/2024 1:10 PM  For on call review [email or web address].  "

## 2024-11-30 DIAGNOSIS — L97919 Non-pressure chronic ulcer of unspecified part of right lower leg with unspecified severity: Secondary | ICD-10-CM

## 2024-11-30 DIAGNOSIS — L03115 Cellulitis of right lower limb: Secondary | ICD-10-CM | POA: Diagnosis not present

## 2024-11-30 DIAGNOSIS — L03116 Cellulitis of left lower limb: Secondary | ICD-10-CM | POA: Diagnosis not present

## 2024-11-30 LAB — GLUCOSE, CAPILLARY
Glucose-Capillary: 106 mg/dL — ABNORMAL HIGH (ref 70–99)
Glucose-Capillary: 157 mg/dL — ABNORMAL HIGH (ref 70–99)
Glucose-Capillary: 207 mg/dL — ABNORMAL HIGH (ref 70–99)
Glucose-Capillary: 130 mg/dL — ABNORMAL HIGH (ref 70–99)

## 2024-11-30 MED ORDER — FUROSEMIDE 40 MG PO TABS
40.0000 mg | ORAL_TABLET | Freq: Every day | ORAL | Status: DC
  Administered 2024-12-01 – 2024-12-03 (×3): 40 mg via ORAL
  Filled 2024-11-30 (×4): qty 1

## 2024-11-30 MED ORDER — BISACODYL 10 MG RE SUPP
10.0000 mg | Freq: Once | RECTAL | Status: AC
  Administered 2024-11-30: 10 mg via RECTAL
  Filled 2024-11-30: qty 1

## 2024-11-30 MED ORDER — ACETAMINOPHEN-CODEINE 300-30 MG PO TABS
1.0000 | ORAL_TABLET | Freq: Four times a day (QID) | ORAL | Status: DC | PRN
  Administered 2024-11-30 – 2024-12-01 (×4): 1 via ORAL
  Filled 2024-11-30 (×4): qty 1

## 2024-11-30 NOTE — Consult Note (Signed)
 [facility] Psychiatric Consult Initial  Patient Name: .Jenny [name]  MRN: [id number]  DOB: 1933-10-10  Consult Order details:  Orders (From admission, onward)     Start     Ordered   11/30/24 1100  IP CONSULT TO PSYCHIATRY       Ordering Provider: Lanetta Lingo, MD  Provider:  Ruther Millie SAUNDERS, MD  Question Answer Comment  Location [facility]   Reason for Consult? Anxiety. Extensive psych history      11/30/24 1100             Mode of Visit: In person    Psychiatry Consult Evaluation  Service Date: November 30, 2024 LOS:  LOS: 16 days  Chief Complaint I had difficulty breathing and in lots of pain  Primary Psychiatric Diagnoses  Anxiety   Assessment   Noted from MD note in chart Family Communication: Had an extensive conversation with patient's son at the bedside who is requesting behavioral health evaluation for anxiety and acute agitation.  Stated that his mother had called him on 12/22 and was very tearful and agitated.  He would want her started on medication prior to discharge to the skilled nursing facility.     Patient does not currently meet criteria for involuntary commitment. She does not meet criteria for nonemergent forced medications. Patient appears to retain decision-making capacity for her medical care, and as such, psychiatric medications cannot be administered without her informed consent. Psychiatry will remain available for consultation as needed and will attempt to obtain collateral information from her son. It should be noted that while the son reportedly expressed concern that patient should be started on medications prior to transfer, patient maintains the right to refuse treatment and is not a candidate for nonemergent forced medications at this time.  Patient currently denied suicidal homicidal ideations.On current presentation there was no evidence of psychosis or mania and patient did not appear to be responding to  internal stimuli. At this time, patient does not appear to be a risk to self or others.While future psychiatric events cannot be accurately predicted, the patient does not currently require acute inpatient psychiatric care and does not currently meet [place]  involuntary commitment criteria.   Diagnoses:  Active Hospital problems: Principal Problem:   Bilateral lower leg cellulitis Active Problems:   Essential hypertension   CAD (coronary artery disease)   Tachycardia-bradycardia syndrome (HCC)   Stage 3b chronic kidney disease (HCC)   Type 2 diabetes mellitus with diabetic chronic kidney disease (HCC)   Type II diabetes mellitus with peripheral circulatory disorder (HCC)   Acute on chronic heart failure with preserved ejection fraction (HFpEF) (HCC)   COPD with acute exacerbation (HCC)   CKD (chronic kidney disease) stage 4, GFR 15-29 ml/min (HCC)   Acute pulmonary embolism (HCC)   Permanent atrial fibrillation (HCC)   Obesity, Class III, BMI 40-49.9 (morbid obesity) (HCC)   Sepsis (HCC)   PAD (peripheral artery disease)    Plan   ## Psychiatric Medication Recommendations:  With patient report of symptoms occurring relatively recently (3 weeks ago), it is noted that patient is currently receiving prednisone  daily therapy. This could be contributing to reported increased agitation and the symptoms listed below under HPI  ## Medical Decision Making Capacity: Not specifically addressed in this encounter  ## Further Work-up:   -- most recent EKG on 11/20/2024 had QtC of 474 -- Pertinent labwork reviewed earlier this admission includes: CBC BMP   ## Disposition:-- There are no  psychiatric contraindications to discharge at this time  ## Behavioral / Environmental: - No specific recommendations at this time.     ## Safety and Observation Level:  - Based on my clinical evaluation, I estimate the patient to be at low risk of self harm in the current setting. - At this time,  we recommend  routine. This decision is based on my review of the chart including patient's history and current presentation, interview of the patient, mental status examination, and consideration of suicide risk including evaluating suicidal ideation, plan, intent, suicidal or self-harm behaviors, risk factors, and protective factors. This judgment is based on our ability to directly address suicide risk, implement suicide prevention strategies, and develop a safety plan while the patient is in the clinical setting. Please contact our team if there is a concern that risk level has changed.  CSSR Risk Category:C-SSRS RISK CATEGORY: No Risk  Suicide Risk Assessment: Patient has following modifiable risk factors for suicide: triggering events, which we are addressing by utilizing therapeutic communication to give patient safe space to discuss concerns. Patient has following non-modifiable or demographic risk factors for suicide: N/A Patient has the following protective factors against suicide: Supportive family, no history of suicide attempts, and no history of NSSIB  Thank you for this consult request. Recommendations have been communicated to the primary team.  We will attempt to reach son for collateral information at this time.   Zelda Sharps, NP        History of Present Illness  Relevant Aspects of [facility]   Patient Report:  Psychiatry consulted by medical team per son's request due to concerns for anxiety and reported agitation. On examination today, patient presented as calm and cooperative with this provider. Patient attributes her presentation to significant difficulty breathing and pain experienced yesterday. Patient was able to recall her reason for initial hospitalization. She reported receiving Social Security benefits and manages her own finances with assistance from her son. She stated the current plan involves transfer to a rehabilitation facility, with social work actively  assisting in placement options.  Patient endorsed increased anxiety related to her current medical situation and multiple stressors, including her recent illness and the discovery of black mold in her apartment. She expressed hesitancy to initiate any new medications, noting she is already on numerous medications. When asked specifically about auditory and visual hallucinations, patient stated that is a good question and went on to describe brief, random episodes over the past 3 weeks in which she visualizes what she presumes to be healthcare workers that disappear when she attempts to look directly at them. She clarified these episodes began approximately 3 weeks ago, coinciding with her hospitalization, and have never occurred prior to this timeframe.  Patient denied suicidal or homicidal ideations. She denied any known mental health history, previous self-harm, or previous inpatient psychiatric admissions. She denied access to weapons or any upcoming legal charges. On mental status examination, there was no evidence of mania or psychosis, and patient did not appear to be responding to internal stimuli.  Psych ROS:  Depression: Denied Anxiety:  Endorsed Mania (lifetime and current): Denied Psychosis: (lifetime and current): Denied      Psychiatric and Social History  Psychiatric History:  Information collected from Patient/chart review  Prev Dx/Sx: Anxiety None Current Psych Provider: None Home Meds (current): Denied psychotropic medications Previous Med Trials: Denied Therapy: Denied  Prior Psych Hospitalization: Denied Prior Self Harm: Denied Prior Violence: Denied  Family Psych History: Unknown Family Hx suicide:  Unknown  Social History:    Occupational Hx: Retired, reported receives SSI Legal Hx: Denied Living Situation: Plan to transition to a different skilled nursing facility Access to weapons/lethal means: Denied  Substance History Alcohol : Denied Tobacco:  Denied Illicit drugs: Denied Prescription drug abuse: Denied Rehab hx: Denied  Exam Findings  Physical Exam: Reviewed and agree with the physical exam findings conducted by the medical provider Vital Signs:  Temp:  [97.6 F (36.4 C)-98.3 F (36.8 C)] 97.6 F (36.4 C) (12/23 0756) Pulse Rate:  [86-98] 89 (12/23 0756) Resp:  [16-20] 16 (12/23 0756) BP: (110-154)/(63-88) 143/75 (12/23 0756) SpO2:  [82 %-100 %] 100 % (12/23 0756) Weight:  [84.8 kg] 84.8 kg (12/23 0505) Blood pressure (!) 143/75, pulse 89, temperature 97.6 F (36.4 C), resp. rate 16, height 5' 2 (1.575 m), weight 84.8 kg, SpO2 100%. Body mass index is 34.18 kg/m.    Mental Status Exam: General Appearance: Casual  Orientation:  Full (Time, Place, and Person)  Memory:  Fair  Concentration:  Concentration: Good  Recall:  Good  Attention  Good  Eye Contact:  Good  Speech:  Clear and Coherent  Language:  Fair  Volume:  Normal  Mood: Anxious  Affect:  Congruent  Thought Process:  Coherent, Goal Directed, and Linear  Thought Content:  Logical  Suicidal Thoughts:  No  Homicidal Thoughts:  No  Judgement:  Fair  Insight:  Fair  Psychomotor Activity:  Normal  Akathisia:  No  Fund of Knowledge:  Fair      Assets:  Manufacturing Systems Engineer Desire for Improvement Financial Resources/Insurance Housing Social Support  Cognition:  WNL  ADL's:  Impaired  AIMS (if indicated):        Other History   These have been pulled in through the EMR, reviewed, and updated if appropriate.  Family History:  The patient's family history includes Hypertension in her mother; Stomach cancer in her father; Stroke in her maternal grandmother; Ulcers in her mother.  Medical History: Past Medical History:  Diagnosis Date   ACUT DUOD ULCER W/HEMORR&PERF W/O MENTION OBST 10/05/2009   NSAID induced   ALLERGIC RHINITIS CAUSE UNSPECIFIED    ANEMIA-NOS    CAD (coronary artery disease) 06/08/2009   DEs RCA with 70% LAD and EF 60%    CHF (congestive heart failure) (HCC)    COPD    mild obst on PFTs 03/2010   Diabetes mellitus 06/2010 dx   Mild, diet controlled   GLAUCOMA    HYPERLIPIDEMIA    HYPERTENSION, BENIGN    MYOCARDIAL INFARCTION 06/08/2009   des to rca   Persistent atrial fibrillation (HCC)    Dx 08/2021   TOBACCO ABUSE     Surgical History: Past Surgical History:  Procedure Laterality Date   CARDIOVERSION N/A 01/30/2022   Procedure: CARDIOVERSION;  Surgeon: Alveta Aleene PARAS, MD;  Location: [facility] ENDOSCOPY;  Service: Cardiovascular;  Laterality: N/A;   HEMORRHOID SURGERY  1990   LOWER EXTREMITY ANGIOGRAPHY Right 11/17/2024   Procedure: Lower Extremity Angiography;  Surgeon: Marea Selinda RAMAN, MD;  Location: [facility] INVASIVE CV LAB;  Service: Cardiovascular;  Laterality: Right;   LOWER EXTREMITY ANGIOGRAPHY Left 11/29/2024   Procedure: Lower Extremity Angiography;  Surgeon: Marea Selinda RAMAN, MD;  Location: [facility] INVASIVE CV LAB;  Service: Cardiovascular;  Laterality: Left;   Right knee surgery       Medications:  Current Medications[1]  Allergies: Allergies[2]  Zelda Sharps, NP This note was created using Dragon dictation software. Please excuse any inadvertent transcription  errors. Case was discussed with supervising physician Dr. Jadapalle who is agreeable with current plan.       [1]  Current Facility-Administered Medications:    acetaminophen  (TYLENOL ) tablet 650 mg, 650 mg, Oral, Q6H PRN, 650 mg at 11/29/24 2145 **OR** acetaminophen  (TYLENOL ) suppository 650 mg, 650 mg, Rectal, Q6H PRN, Marea, Selinda RAMAN, MD   acetaminophen -codeine  (TYLENOL  #3) 300-30 MG per tablet 1 tablet, 1 tablet, Oral, Q6H PRN, Agbata, Tochukwu, MD, 1 tablet at 11/30/24 1149   albuterol  (PROVENTIL ) (2.5 MG/3ML) 0.083% nebulizer solution 2.5 mg, 2.5 mg, Nebulization, Q2H PRN, Dew, Jason S, MD, 2.5 mg at 11/29/24 2021   apixaban  (ELIQUIS ) tablet 2.5 mg, 2.5 mg, Oral, BID, Agbata, Tochukwu, MD, 2.5 mg at 11/30/24 0932   budesonide  (PULMICORT )  nebulizer solution 0.25 mg, 0.25 mg, Nebulization, BID, Dew, Jason S, MD, 0.25 mg at 11/30/24 9273   dorzolamide  (TRUSOPT ) 2 % ophthalmic solution 1 drop, 1 drop, Both Eyes, BID, Dew, Selinda RAMAN, MD, 1 drop at 11/30/24 1038   furosemide  (LASIX ) injection 40 mg, 40 mg, Intravenous, Daily, Dew, Selinda RAMAN, MD, 40 mg at 11/30/24 0932   gabapentin  (NEURONTIN ) capsule 100 mg, 100 mg, Oral, Daily, Dew, Jason S, MD, 100 mg at 11/30/24 0932   guaiFENesin  (MUCINEX ) 12 hr tablet 600 mg, 600 mg, Oral, BID, Dew, Jason S, MD, 600 mg at 11/30/24 0932   hydrocerin (EUCERIN) cream, , Topical, BID, Marea Selinda RAMAN, MD, Given at 11/30/24 1038   hydrOXYzine  (ATARAX ) tablet 25 mg, 25 mg, Oral, Q8H PRN, Dew, Jason S, MD, 25 mg at 11/29/24 2003   insulin  aspart (novoLOG ) injection 0-9 Units, 0-9 Units, Subcutaneous, TID WC, Dew, Jason S, MD, 1 Units at 11/29/24 1716   ipratropium-albuterol  (DUONEB) 0.5-2.5 (3) MG/3ML nebulizer solution 3 mL, 3 mL, Nebulization, TID, Dew, Jason S, MD, 3 mL at 11/30/24 9273   latanoprost  (XALATAN ) 0.005 % ophthalmic solution 1 drop, 1 drop, Both Eyes, QHS, Dew, Jason S, MD, 1 drop at 11/29/24 2159   nystatin  (MYCOSTATIN /NYSTOP ) topical powder, , Topical, BID, Dew, Jason S, MD, Given at 11/30/24 1200   ondansetron  (ZOFRAN ) tablet 4 mg, 4 mg, Oral, Q6H PRN **OR** ondansetron  (ZOFRAN ) injection 4 mg, 4 mg, Intravenous, Q6H PRN, Marea, Selinda RAMAN, MD   Oral care mouth rinse, 15 mL, Mouth Rinse, PRN, Marea, Selinda RAMAN, MD   pantoprazole  (PROTONIX ) EC tablet 40 mg, 40 mg, Oral, Daily, Dew, Jason S, MD, 40 mg at 11/30/24 0932   polyethylene glycol (MIRALAX  / GLYCOLAX ) packet 17 g, 17 g, Oral, Daily, Dew, Selinda RAMAN, MD, 17 g at 11/28/24 1120   predniSONE  (DELTASONE ) tablet 10 mg, 10 mg, Oral, Q breakfast, Dew, Selinda RAMAN, MD, 10 mg at 11/30/24 0932   rosuvastatin  (CRESTOR ) tablet 20 mg, 20 mg, Oral, Daily, Dew, Jason S, MD, 20 mg at 11/30/24 0932   senna-docusate (Senokot-S) tablet 2 tablet, 2 tablet, Oral, QHS, Marea Selinda RAMAN, MD, 2 tablet at 11/29/24 2144   sodium chloride  (OCEAN) 0.65 % nasal spray 1 spray, 1 spray, Each Nare, PRN, Dew, Jason S, MD, 1 spray at 11/29/24 2146   sodium phosphate  (FLEET) enema 1 enema, 1 enema, Rectal, Daily PRN, Marea Selinda RAMAN, MD, 1 enema at 11/21/24 1608 [2]  Allergies Allergen Reactions   Aspirin  Other (See Comments)    Can take 81 mg not 325 mg -bleeding   Entresto  [Sacubitril -Valsartan ] Shortness Of Breath    COPD and reports increased SOB with Entresto    Atorvastatin  Itching and Rash  Itching and myaliga     Cyclobenzaprine      muscle relaxers-ulcer hemorrhage (hospitalized), ulcer hemorrhage   Lactose Intolerance (Gi) Other (See Comments)    GI upset   Meperidine Hcl Other (See Comments)    Not known   Pravastatin  Rash   Propoxyphene     Hallucination, hallucinations, vomiting   Wound Dressing Adhesive Other (See Comments)    red, stings

## 2024-11-30 NOTE — Progress Notes (Signed)
 " Progress Note   Patient: Jenny [name] FMW:[id number] DOB: Sep 08, 1933 DOA: 11/14/2024     16 DOS: the patient was seen and examined on 11/30/2024   Brief hospital course:  Jenny [name] is a 89 y.o. female with medical history significant for HFpEF, hypertension, persistent A-fib on Eliquis , tachybradycardia syndrome, CAD, COPD, CKD stage 3b, type 2 diabetes, anemia. Presents to ED from [facility] (rehab), 3 days LE edema and R leg pain 1 day, also SOB   12/07: to ED early hours AM. CTA chest showed tiny segmental PE left lower lobe without heart strain, extensive multivessel CAD and mild asymmetric pulmonary edema around the left hilum. Initially was treated with DuoNebs and Solu-Medrol . She initially received LR boluses but subsequently given a dose of Lasix  as test results came in. Started on vancomycin  and Rocephin  and also on a heparin  infusion for PE. Admitted with question sepsis d/t lower extremity cellulitis, vs non-infectious SIRS multifactorial including acute dyspnea secondary to COPD and CHF exacerbation. Podiatry consulted re: venous stasis ulcers on feet - bedisde debridement, appear superficial and recs for daily dressing changes, ACE wraps to knee, po abx on dc, WBAT, f/u outpatient 1 week after discharge   12/08: Vascular surgery consult d/t decreased ABI / PE> No thrombectomy given small PE. RLE angio planned for 12/10 12/09: stable 12/10: LLE angio and stents today. Cr 1.99 12/11: renal fxn improving Cr 1.56. Vascular surgery plans on taking the patient to the vascular lab on Monday, 11/22/2024 for RLE angiogram - pt to remain on heparin  infusion until after procedure  12/12: doing well / stable.  12/13: VERY short of breath w/ minimal exertion but improves quickly w/ rest. Changed nebs, will give 1 dose IV lasix  today  12/14: good UOP w/ IV lasix , increased po dose today. Still quite SOB, wheezing. (+)influenza A --> tamiflu , postponing angio 12/15-12/16: continue tx  COPD exacerbation d/t influenza. Tentative reschedule angio for 12/18 12/17 -still short of breath but improved.  Remains on 2 L of oxygen via nasal cannula.  Unable to lay flat 12/18 -still short of breath but improved.  Right lower extremity angiogram deferred since patient is unable to lay flat.  Planned for 12/22 12/19 -complains of worsening shortness of breath.  Noted to have wheezing on exam. 12/20 - Feels better 12/21 -noted to be short of breath at rest.  Had conversational dyspnea 12/22 -appears comfortable and in no distress.  Scheduled for lower extremity angiogram today 12/23 -complains of severe pain in her right lower extremity.  Son at the bedside requesting for psych evaluation due to severe anxiety and agitation.       Assessment and Plan:   LE edema / erythema  Multifactorial, include: Venous stasis dermatitis / ulceration  Cellulitis  Peripheral arterial disease LE edema d/t CHF / HFpEF exacerbation  Treat underlying cause(s) as noted    Dyspnea, multifactorial COPD likely exacerbated by influenza A infection.  Has completed a 5-day course of Tamiflu  CHF appeared to be acutely exacerbate, .2/21. Continue Lasix  40 IV daily .  Monitor renal function closely. Continue bronchodilator therapy and changed to 3 times daily as well as inhaled steroids [place] O2 to maintain pulse oximetry greater than 92% Tx Flu, COPD, and CHF as noted below     COPD  Chronic hypoxic respiratory failure on 2L [place] O2 at baseline  Exacerbated now d/t influenza A Arrives w/ prednisone  20 on med list, says has been maintained on that for a  couple of months prednisone  switched back to 10 daily (12/19) --> burst 40 mg daily x 5 days given new exacerbation, then ordered back to 10 mg daily  Continue O2 supplementation at 2 L Completed course of Tamiflu  Continue schedule nebs --> budesonide  bid, duoneb TID while awake Albuterol  prn  will need close outpt pulm f/u   Venous stasis   Venous stasis ulcers lower extremities bilaterally Question Cellulitis daily dressing changes, ACE wraps to knee Completed course of Keflex  WBAT f/u podiatry outpatient 1 week after discharge    Peripheral arterial disease  S/p angiogram RLE w/ stenting 11/17/24 Vascular surgery following Status post left lower extremity percutaneous transluminal angioplasty of left common femoral artery Continue Eliquis  Pain control.  Leg is warm with palpable pulses     Acute PE Very small.  Continue Eliquis  2.5 mg twice daily   HFpEF with acute exacerbation - Last known LVEF of 60 to 65% with no regional wall motion abnormality and moderate asymmetric LVH of the basal septal segment, 2D echocardiogram which was done 07/25 Continue Lasix  40 mg IV daily for 24 to 48 hours and switch to oral Monitor renal function closely   Hyperkalemia - resolved  Monitor BMP   AKI on ckd 3b - resolved  Cr 1.39 on admission --> peak at 2.09 on 12/08 --> 11/21/2024 1.29 Renal function is stable Monitor closely   History tachy-brady A-fib Rate controlled currently Resume Eliquis  2.5 mg twice daily   Elevated troponin, ACS ruled out  Mild, stable, flat, likely demand as no chest pain Recheck troponin / EKG prn chest pain.    Neuropathy home gabapentin    Pulm nodule outpt f/u       Debility Comes from rehab PT consulted patient does not want to return to [facility]. TOC aware, looking elsewhere but if refusing available facility then will have to DC to home instead if unable to arrange alternative placement        Class 2 / borderline Class 3 obesity based on BMI:  Body mass index is 35.32kg/m. Complicates overall prognosis and care              Subjective: Lying in bed.  Appears comfortable and in no distress  Physical Exam: Vitals:   11/29/24 1945 11/29/24 2034 11/30/24 0505 11/30/24 0756  BP: (!) 154/88  137/63 (!) 143/75  Pulse: 86  98 89  Resp:   20 16  Temp:  98.3 F (36.8 C)  98 F (36.7 C) 97.6 F (36.4 C)  TempSrc: Oral  Oral   SpO2: 96% 98% (!) 82% 100%  Weight:   84.8 kg   Height:       General: Chronically ill-appearing, appears comfortable and in no distress HEENT : Pale conjunctiva, anicteric sclera Cardiac:  RRR, Normal S1, S2. No murmurs. Lungs: Rales at the bases, scattered rhonchi, scattered wheezes in all lung fields Cardiovascular : RRR S1,S2  Abdomen:  Positive bowel sounds throughout, morbid obesity, soft non tender  Neurologic: AAOX3 answers all questions and follows commands.  Extremities:  All extremities are warm to touch with palpable pulses in upper extremities. Lower extremities are bandaged   Data Reviewed:  There are no new results to review at this time.  Family Communication: Had an extensive conversation with patient's son at the bedside who is requesting behavioral health evaluation for anxiety and acute agitation.  Stated that his mother had called him on 12/22 and was very tearful and agitated.  He would want her  started on medication prior to discharge to the skilled nursing facility.  Disposition: Status is: Inpatient Remains inpatient appropriate because: Pain control postprocedure,  Planned Discharge Destination: Skilled nursing facility    Time spent: 40 minutes  Author: Aimee Somerset, MD 11/30/2024 2:28 PM  For on call review [email or web address].  "

## 2024-11-30 NOTE — Plan of Care (Signed)
" °  Problem: Coping: Goal: Ability to adjust to condition or change in health will improve Outcome: Progressing   Problem: Metabolic: Goal: Ability to maintain appropriate glucose levels will improve Outcome: Progressing   Problem: Tissue Perfusion: Goal: Adequacy of tissue perfusion will improve Outcome: Progressing   Problem: Pain Managment: Goal: General experience of comfort will improve and/or be controlled Outcome: Progressing   Problem: Respiratory: Goal: Ability to maintain a clear airway will improve Outcome: Progressing   "

## 2024-11-30 NOTE — Progress Notes (Signed)
 PT Cancellation Note  Patient Details Name: Jenny [name] MRN: [id number] DOB: 1933-07-19   Cancelled Treatment:    Reason Eval/Treat Not Completed: Other (comment)  Pt offered and encouraged session.   She stated LE is painful they say it is the blood flow going back in.  She is encouraged to participate in bed level ex or mobility but she declined both I have to keep my legs elevated  Pt asked for and given ice chips.  Will return at a later time/date.  Lauraine Gills 11/30/2024, 1:48 PM

## 2024-11-30 NOTE — Progress Notes (Signed)
 Occupational Therapy Treatment Patient Details Name: Jenny [name] MRN: [id number] DOB: [birth year] Today's Date: 11/30/2024   History of present illness Pt is an 89 y/o F admitted on 11/14/24 after presenting with c/o BLE pain, SOB. Pt is being treated for BLE cellulitis, also found to have acute PE. Pt is s/p RLE angio & stents on 11/17/24. Pt tested + for Influenza A on 11/21/24. PMH: HFpEF, HTN, persistent a-fib on Eliquis , tachybradycardia syndrome, CAD, COPD, CKD 3B, DM2, anemia   OT comments  [name] on arrival. Remains groggy, but agreeable to bed level exercises this session. Provided yellow theraband and educated on BUE strengthening exercises with pt performed 2 exercises for 5 reps x 2 sets with mod cueing for technique. She fatigued very easily and opens/closes eyes throughout session. Denies pain.  Pt returned to bed with all needs in place and will cont to require skilled acute OT services to maximize her safety and IND to return to PLOF.       If plan is discharge home, recommend the following:  A little help with walking and/or transfers;A lot of help with bathing/dressing/bathroom;Assistance with cooking/housework;Assist for transportation;Help with stairs or ramp for entrance   Equipment Recommendations  Other (comment) (defer)    Recommendations for Other Services      Precautions / Restrictions Precautions Precautions: Fall Recall of Precautions/Restrictions: Intact Precaution/Restrictions Comments: monitor O2 Restrictions Weight Bearing Restrictions Per Provider Order: No       Mobility Bed Mobility               General bed mobility comments: pt able to reposition self in bed    Transfers                   General transfer comment: deferred, pt declined after surgery     Balance                                           ADL either performed or assessed with clinical judgement   ADL                                          General ADL Comments: pt declined stating she had already changed out her gown and washed her face after her procedure    Extremity/Trunk Assessment              Vision       Perception     Praxis     Communication Communication Communication: Impaired Factors Affecting Communication: Hearing impaired   Cognition Arousal: Alert Behavior During Therapy: WFL for tasks assessed/performed                                 Following commands: Intact        Cueing   Cueing Techniques: Verbal cues  Exercises Other Exercises Other Exercises: Pt provided yellow theraband and edu on BUE strengthening exercises, pt only tolerated 2 exercises for 5 reps x 2 sets before becoming fatigued. Pt still groggy after procedure. Edu on importance of continued activity and exercise to maximize strength and return of function.    Shoulder Instructions       General Comments  Pertinent Vitals/ Pain       Pain Assessment Pain Assessment: No/denies pain Pain Intervention(s): Monitored during session  Home Living                                          Prior Functioning/Environment              Frequency  Min 2X/week        Progress Toward Goals  OT Goals(current goals can now be found in the care plan section)  Progress towards OT goals: Progressing toward goals  Acute Rehab OT Goals Patient Stated Goal: get stronger OT Goal Formulation: With patient Time For Goal Achievement: 12/14/24 Potential to Achieve Goals: Good  Plan      Co-evaluation                 AM-PAC OT 6 Clicks Daily Activity     Outcome Measure   Help from another person eating meals?: None Help from another person taking care of personal grooming?: A Little Help from another person toileting, which includes using toliet, bedpan, or urinal?: A Lot Help from another person bathing (including washing, rinsing,  drying)?: A Lot Help from another person to put on and taking off regular upper body clothing?: A Little Help from another person to put on and taking off regular lower body clothing?: A Lot 6 Click Score: 16    End of Session Equipment Utilized During Treatment: Oxygen  OT Visit Diagnosis: Unsteadiness on feet (R26.81);Other abnormalities of gait and mobility (R26.89);Muscle weakness (generalized) (M62.81);History of falling (Z91.81)   Activity Tolerance Patient limited by fatigue;Patient tolerated treatment well   Patient Left with call bell/phone within reach;in bed;with bed alarm set   Nurse Communication Mobility status        Time: 1550-1605 OT Time Calculation (min): 15 min  Charges: OT General Charges $OT Visit: 1 Visit OT Treatments $Therapeutic Exercise: 8-22 mins  Jenny [name], OTR/L  11/29/2024, 7:31 AM   Jenny [name] 11/30/2024, 7:27 AM

## 2024-11-30 NOTE — Progress Notes (Signed)
" °   11/30/24 0815  Spiritual Encounters  Type of Visit Initial  Care provided to: Patient  Conversation partners present during encounter Nurse  Reason for visit Routine spiritual support  OnCall Visit Yes  Spiritual Framework  Presenting Themes Significant life change;Impactful experiences and emotions;Community and relationships  Community/Connection Family  Needs/Challenges/Barriers Heath concerns  Patient Stress Factors Health changes;Loss of control;Loss;Major life changes  Family Stress Factors None identified  Goals  Additional Comment(s) Patient shared she'll be moving to a fanility Friday.  Interventions  Spiritual Care Interventions Made Encouragement;Supported grief process;Explored values/beliefs/practices/strengths;Normalization of emotions;Reflective listening;Compassionate presence;Narrative/life review  Spiritual Care Plan  Spiritual Care Issues Still Outstanding Chaplain will continue to follow   Chaplain staff has been told patient would like to have Chaplain visit each day.  Patient shared she's been given 6 months to live.  Chaplain assessed patient is grieving the loss of her independence and doesn't want to inconvenience anyone because she needs so much care.  Chaplain allowed patient to share her concerns, stories about her family and things she enjoyed doing with her time.  Chaplain was a compassionate presence.  Chaplains will continue to visit patient.    Rev. Rana M. Nicholaus, M.Div. Chaplain Resident [facility]   "

## 2024-11-30 NOTE — TOC Progression Note (Signed)
 Transition of Care [facility]) - Progression Note    Patient Details  Name: Jenny [name] MRN: [id number] Date of Birth: [birth year]  Transition of Care [facility]) CM/SW Contact  Nathanael CHRISTELLA Ring, RN Phone Number: 11/30/2024, 3:20 PM  Clinical Narrative:    Patient will not discharge today, provider is ordering a psych consult and her son expressed concerns about the way her foot was looking after her angio yesterday.  He does not feel she is medically ready for discharge and prefers that she not discharge until Friday.  He is visibly frustrated, some with his mother and some with the hospital.  He said that last night she had an episode where she was screaming and saying the staff was trying to kill her and he had to come back up to the hospital after leaving to calm her down.  TOC will cont to follow.     Expected Discharge Plan: Skilled Nursing Facility Barriers to Discharge: Continued Medical Work up               Expected Discharge Plan and Services     Post Acute Care Choice: Skilled Nursing Facility Living arrangements for the past 2 months: Skilled Nursing Facility, Single Family Home                                       Social Drivers of Health (SDOH) Interventions SDOH Screenings   Food Insecurity: No Food Insecurity (11/15/2024)  Housing: Low Risk (11/15/2024)  Transportation Needs: No Transportation Needs (11/15/2024)  Utilities: Not At Risk (11/15/2024)  Alcohol  Screen: Low Risk (07/02/2024)  Depression (PHQ2-9): Low Risk (07/22/2022)  Financial Resource Strain: Low Risk (07/02/2024)  Social Connections: Socially Isolated (11/15/2024)  Tobacco Use: Medium Risk (11/15/2024)    Readmission Risk Interventions    07/05/2024   10:24 AM  Readmission Risk Prevention Plan  Transportation Screening Complete  HRI or Home Care Consult Complete  Social Work Consult for Recovery Care Planning/Counseling Complete  Palliative Care Screening Not Applicable  Medication Review  Oceanographer) Complete

## 2024-11-30 NOTE — Progress Notes (Signed)
 " Progress Note    11/30/2024 9:34 AM 1 Day Post-Op  Subjective:  Jenny [name] is an 89 yo female now POD #1 From:  Date of Surgery: 11/29/2024   Surgeon(s):DEW,JASON     Assistants:none   Pre-operative Diagnosis: PAD with ulceration LLE   Post-operative diagnosis:  Same   Procedure(s) Performed:             1.  Ultrasound guidance for vascular access right femoral artery             2.  Catheter placement into left common femoral artery from right femoral approach             3.  Aortogram and selective left lower extremity angiogram             4.  Percutaneous transluminal angioplasty of left common femoral artery with a 6 mm diameter ultra score balloon and a 6 mm diameter Lutonix drug-coated balloon             5.  StarClose closure device right femoral artery   EBL: 5 cc   Contrast: 55 cc   Fluoro Time: 2.5 minutes  AND POD # 13 From:  Subjective:  Jenny [name] is an 89 yo female who is now POD #7 from:   Date of Surgery: 11/17/2024   Surgeon(s):DEW,JASON     Assistants:none   Pre-operative Diagnosis: PAD with ulceration of the right lower extremity   Post-operative diagnosis:  Same   Procedure(s) Performed:             1.  Ultrasound guidance for vascular access left femoral artery             2.  Catheter placement into right common femoral artery from left femoral approach             3.  Aortogram and selective right lower extremity angiogram             4.  Percutaneous transluminal angioplasty of right distal SFA with 5 mm diameter Lutonix drug-coated angioplasty balloon             5.  Stent placement to the right distal SFA with 6 mm diameter by 10 cm length life stent             6.  Stent placement to the right external iliac artery with 8 mm diameter by 8 cm length life star stent             7.  Celt closure device left femoral artery   EBL: 5 cc   Contrast: 50 cc   Patient is seen comfortably in bed this morning. She endorses that her right  leg feels much better today. She endorses her prior pain to her legs has resolved. Patient continues to be SOB due to her chronic COPD.  No complaints overnight vitals all remained stable. `   Vitals:   11/30/24 0505 11/30/24 0756  BP: 137/63 (!) 143/75  Pulse: 98 89  Resp: 20 16  Temp: 98 F (36.7 C) 97.6 F (36.4 C)  SpO2: (!) 82% 100%   Physical Exam: Cardiac:  RRR, Normal S1, S2. No murmurs. Lungs:  Positive labored breathing due to her COPD with 2 liters [place] oxygen. Positive Rhonchi throughout. No rales but diminished breath sounds in the bases.  Incisions: Left Femoral Artery Puncture site with dressing clean dry and intact. No S&S of hematoma, seroma or infection to note.  Extremities:  All extremities  are warm to touch with palpable pulses in upper extremities. Lower extremities are bandaged but assessment of toes being purple. Only able to get doppler DP/PT pulses and they are weak  Abdomen:  Positive bowel sounds throughout, morbid obesity, soft non tender  Neurologic: AAOX3 answers all questions and follows commands.   CBC    Component Value Date/Time   WBC 10.6 (H) 11/29/2024 0101   RBC 3.42 (L) 11/29/2024 0101   HGB 8.5 (L) 11/29/2024 0101   HCT 29.0 (L) 11/29/2024 0101   PLT 432 (H) 11/29/2024 0101   MCV 84.8 11/29/2024 0101   MCH 24.9 (L) 11/29/2024 0101   MCHC 29.3 (L) 11/29/2024 0101   RDW 18.6 (H) 11/29/2024 0101   LYMPHSABS 0.3 (L) 11/14/2024 0022   MONOABS 0.7 11/14/2024 0022   EOSABS 0.0 11/14/2024 0022   BASOSABS 0.0 11/14/2024 0022    BMET    Component Value Date/Time   NA 141 11/29/2024 0101   NA 139 10/21/2024 1046   K 3.8 11/29/2024 0101   CL 93 (L) 11/29/2024 0101   CO2 38 (H) 11/29/2024 0101   GLUCOSE 224 (H) 11/29/2024 0101   BUN 32 (H) 11/29/2024 0101   BUN 48 (H) 10/21/2024 1046   CREATININE 1.30 (H) 11/29/2024 0101   CALCIUM  8.3 (L) 11/29/2024 0101   GFRNONAA 39 (L) 11/29/2024 0101   GFRAA  10/09/2009 0350    >60        The eGFR  has been calculated using the MDRD equation. This calculation has not been validated in all clinical situations. eGFR's persistently <60 mL/min signify possible Chronic Kidney Disease.    INR    Component Value Date/Time   INR 1.3 (H) 11/14/2024 0354     Intake/Output Summary (Last 24 hours) at 11/30/2024 0934 Last data filed at 11/29/2024 1537 Gross per 24 hour  Intake 500.63 ml  Output 450 ml  Net 50.63 ml     Assessment/Plan:  89 y.o. female is s/p SEE ABOVE  1 Day Post-Op   PLAN Patient has now completed bilateral lower extremity angiograms with stent placement.  Okay to restart Eliquis  2.5 mg twice daily. No antiplatelet necessary due to the patient's age and condition.  Eliquis  will be sufficient. Okay per vascular surgery for patient to go to rehab if medical team agrees and the patient is stable.  Per the son she has a bed at [facility] rehab is what I been told. Patient will follow-up with vein and vascular services in 3 to 4 weeks with vascular ultrasounds and ABIs.  DVT prophylaxis: Eliquis  2.5 mg twice daily   [name] [facility] 11/30/2024 9:34 AM   "

## 2024-12-01 ENCOUNTER — Encounter
Admission: EM | Disposition: A | Discharge status: Skilled Nursing Facility | Payer: Self-pay | Source: Home / Self Care | Attending: Internal Medicine

## 2024-12-01 ENCOUNTER — Encounter: Payer: Self-pay | Admitting: Vascular Surgery

## 2024-12-01 DIAGNOSIS — L03116 Cellulitis of left lower limb: Secondary | ICD-10-CM | POA: Diagnosis not present

## 2024-12-01 DIAGNOSIS — I4891 Unspecified atrial fibrillation: Secondary | ICD-10-CM

## 2024-12-01 DIAGNOSIS — I70221 Atherosclerosis of native arteries of extremities with rest pain, right leg: Secondary | ICD-10-CM

## 2024-12-01 DIAGNOSIS — I743 Embolism and thrombosis of arteries of the lower extremities: Secondary | ICD-10-CM

## 2024-12-01 DIAGNOSIS — L03115 Cellulitis of right lower limb: Secondary | ICD-10-CM | POA: Diagnosis not present

## 2024-12-01 HISTORY — PX: LOWER EXTREMITY ANGIOGRAPHY: CATH118251

## 2024-12-01 LAB — CBC
HCT: 27.9 % — ABNORMAL LOW (ref 36.0–46.0)
Hemoglobin: 8.1 g/dL — ABNORMAL LOW (ref 12.0–15.0)
MCH: 24.6 pg — ABNORMAL LOW (ref 26.0–34.0)
MCHC: 29 g/dL — ABNORMAL LOW (ref 30.0–36.0)
MCV: 84.8 fL (ref 80.0–100.0)
Platelets: 393 K/uL (ref 150–400)
RBC: 3.29 MIL/uL — ABNORMAL LOW (ref 3.87–5.11)
RDW: 19.2 % — ABNORMAL HIGH (ref 11.5–15.5)
WBC: 11.7 K/uL — ABNORMAL HIGH (ref 4.0–10.5)
nRBC: 0.5 % — ABNORMAL HIGH (ref 0.0–0.2)

## 2024-12-01 LAB — GLUCOSE, CAPILLARY
Glucose-Capillary: 219 mg/dL — ABNORMAL HIGH (ref 70–99)
Glucose-Capillary: 213 mg/dL — ABNORMAL HIGH (ref 70–99)
Glucose-Capillary: 160 mg/dL — ABNORMAL HIGH (ref 70–99)

## 2024-12-01 LAB — APTT: aPTT: 139 s — ABNORMAL HIGH (ref 24–36)

## 2024-12-01 LAB — HEPARIN LEVEL (UNFRACTIONATED): Heparin Unfractionated: >1.1 [IU]/mL — ABNORMAL HIGH (ref 0.30–0.70)

## 2024-12-01 LAB — PROTIME-INR
INR: 1.4 — ABNORMAL HIGH (ref 0.8–1.2)
Prothrombin Time: 17.7 s — ABNORMAL HIGH (ref 11.4–15.2)

## 2024-12-01 LAB — MAGNESIUM: Magnesium: 2.2 mg/dL (ref 1.7–2.4)

## 2024-12-01 MED ORDER — MIDAZOLAM HCL (PF) 2 MG/2ML IJ SOLN
INTRAMUSCULAR | Status: DC | PRN
  Administered 2024-12-01: 1 mg via INTRAVENOUS
  Administered 2024-12-01: .5 mg via INTRAVENOUS

## 2024-12-01 MED ORDER — CEFAZOLIN SODIUM-DEXTROSE 2-4 GM/100ML-% IV SOLN
2.0000 g | INTRAVENOUS | Status: AC
  Administered 2024-12-01: 2 g via INTRAVENOUS

## 2024-12-01 MED ORDER — HEPARIN (PORCINE) IN NACL 1000-0.9 UT/500ML-% IV SOLN
INTRAVENOUS | Status: DC | PRN
  Administered 2024-12-01: 1000 mL

## 2024-12-01 MED ORDER — HEPARIN SODIUM (PORCINE) 1000 UNIT/ML IJ SOLN
INTRAMUSCULAR | Status: AC
  Filled 2024-12-01: qty 10

## 2024-12-01 MED ORDER — CEFAZOLIN SODIUM-DEXTROSE 2-4 GM/100ML-% IV SOLN
INTRAVENOUS | Status: AC
  Filled 2024-12-01: qty 100

## 2024-12-01 MED ORDER — MIDAZOLAM HCL 2 MG/2ML IJ SOLN
INTRAMUSCULAR | Status: AC
  Filled 2024-12-01: qty 2

## 2024-12-01 MED ORDER — HEPARIN (PORCINE) 25000 UT/250ML-% IV SOLN
850.0000 [IU]/h | INTRAVENOUS | Status: DC
  Administered 2024-12-01 – 2024-12-02 (×2): 1100 [IU]/h via INTRAVENOUS
  Filled 2024-12-01 (×2): qty 250

## 2024-12-01 MED ORDER — DIPHENHYDRAMINE HCL 50 MG/ML IJ SOLN: 50.0000 mg | Freq: Once | INTRAMUSCULAR | Status: DC | PRN

## 2024-12-01 MED ORDER — FENTANYL CITRATE (PF) 50 MCG/ML IJ SOSY
PREFILLED_SYRINGE | INTRAMUSCULAR | Status: AC
  Filled 2024-12-01: qty 1

## 2024-12-01 MED ORDER — MIDAZOLAM HCL 2 MG/ML PO SYRP: 8.0000 mg | ORAL_SOLUTION | Freq: Once | ORAL | Status: DC | PRN

## 2024-12-01 MED ORDER — HEPARIN SODIUM (PORCINE) 1000 UNIT/ML IJ SOLN
INTRAMUSCULAR | Status: DC | PRN
  Administered 2024-12-01: 5000 [IU] via INTRAVENOUS

## 2024-12-01 MED ORDER — FAMOTIDINE 20 MG PO TABS: 40.0000 mg | ORAL_TABLET | Freq: Once | ORAL | Status: DC | PRN

## 2024-12-01 MED ORDER — HYDROCODONE-ACETAMINOPHEN 5-325 MG PO TABS
1.0000 | ORAL_TABLET | Freq: Four times a day (QID) | ORAL | Status: DC | PRN
  Administered 2024-12-01: 1 via ORAL
  Filled 2024-12-01: qty 1

## 2024-12-01 MED ORDER — SERTRALINE HCL 50 MG PO TABS
25.0000 mg | ORAL_TABLET | Freq: Every day | ORAL | Status: DC
  Administered 2024-12-02: 25 mg via ORAL
  Filled 2024-12-01 (×4): qty 1

## 2024-12-01 MED ORDER — FENTANYL CITRATE (PF) 100 MCG/2ML IJ SOLN
INTRAMUSCULAR | Status: DC | PRN
  Administered 2024-12-01 (×2): 25 ug via INTRAVENOUS

## 2024-12-01 MED ORDER — METHYLPREDNISOLONE SODIUM SUCC 125 MG IJ SOLR: 125.0000 mg | Freq: Once | INTRAMUSCULAR | Status: DC | PRN

## 2024-12-01 MED ORDER — SODIUM CHLORIDE 0.9 % IV SOLN: INTRAVENOUS | Status: DC

## 2024-12-01 MED ORDER — LIDOCAINE-EPINEPHRINE (PF) 1 %-1:200000 IJ SOLN
INTRAMUSCULAR | Status: DC | PRN
  Administered 2024-12-01: 10 mL

## 2024-12-01 MED ORDER — IODIXANOL 320 MG/ML IV SOLN
INTRAVENOUS | Status: DC | PRN
  Administered 2024-12-01: 50 mL

## 2024-12-01 NOTE — Plan of Care (Signed)

## 2024-12-01 NOTE — Interval H&P Note (Signed)
 History and Physical Interval Note:  12/01/2024 12:41 PM  Jenny [name]  has presented today for surgery, with the diagnosis of Right lower extremity ischemia.  The various methods of treatment have been discussed with the patient and family. After consideration of risks, benefits and other options for treatment, the patient has consented to  Procedures: Lower Extremity Angiography (Right) as a surgical intervention.  The patient's history has been reviewed, patient examined, no change in status, stable for surgery.  I have reviewed the patient's chart and labs.  Questions were answered to the patient's satisfaction.     Boaz Berisha

## 2024-12-01 NOTE — Op Note (Signed)
 Jenny [name]  Percutaneous Study/Intervention Procedural Note   Date of Surgery: 12/01/2024  Surgeon(s):Dowell Hoon    Assistants:none  Pre-operative Diagnosis: Acute right lower extremity ischemia, previous bilateral lower extremity revascularization for ulcerations  Post-operative diagnosis:  Same  Procedure(s) Performed:             1.  Ultrasound guidance for vascular access left superficial femoral artery             2.  Catheter placement into right common femoral artery from left femoral approach             3.  Aortogram and selective right lower extremity angiogram             4.  Mechanical thrombectomy using the penumbra CAT 6 lightening bolt catheter to the right SFA, popliteal artery, and tibioperoneal trunk             5.  Covered stent placement x 2 to the right SFA and popliteal arteries down to the origin of the anterior tibial artery with a 6 mm diameter by 25 cm length and a 5 mm diameter by 10 cm length Viabahn stent  6.  Celt closure device left femoral artery  EBL: 150 cc  Contrast: 50 cc  Fluoro Time: 8.1 minutes  Moderate Conscious Sedation Time: approximately 53 minutes using 1.5 mg of Versed  and 50 mcg of Fentanyl               Indications:  Patient is a 89 y.o.female with a known history of PAD who has had interventions to both lower extremities on this hospitalization.  The left leg was earlier this week and the right leg was about 2 weeks ago.  She began having cyanosis and severe pain in her right foot and this morning had neurologic changes and poor sensation and motor and was found to be profoundly ischemic in the right lower extremity.  She had been transition from heparin  to 2.5 mg of Eliquis  for atrial fibrillation, and given the appearance there was concern for an acute ischemic event likely from an A-fib embolus.  The patient is an extremely poor surgical candidate.  The patient is brought in for angiography for further  evaluation and potential treatment.  Due to the limb threatening nature of the situation, angiogram was performed for attempted limb salvage. The patient is aware that if the procedure fails, amputation would be expected.  The patient also understands that even with successful revascularization, amputation may still be required due to the severity of the situation.  Risks and benefits are discussed and informed consent is obtained.   Procedure:  The patient was identified and appropriate procedural time out was performed.  The patient was then placed supine on the table and prepped and draped in the usual sterile fashion. Moderate conscious sedation was administered during a face to face encounter with the patient throughout the procedure with my supervision of the RN administering medicines and monitoring the patient's vital signs, pulse oximetry, telemetry and mental status throughout from the start of the procedure until the patient was taken to the recovery room. Ultrasound was used to evaluate the left common femoral artery.  It was patent .  A digital ultrasound image was acquired.  A Seldinger needle was used to access the left common femoral artery under direct ultrasound guidance and a permanent image was performed.  A 0.035 J wire was advanced without resistance and a 5Fr sheath was placed.  Pigtail catheter  was placed into the aorta and an AP aortogram was performed. This demonstrated normal renal arteries and normal aorta and iliac segments without significant stenosis after previous right iliac stent. I then crossed the aortic bifurcation and advanced to the right femoral head. Selective right lower extremity angiogram was then performed. This demonstrated the right common femoral artery, profunda femoris artery, and proximal superficial femoral artery were all patent without stenosis.  There was an abrupt occlusion of the right SFA about 4 to 5 cm above the previously placed stent.  There is  essentially no flow distally on the initial images. It was felt that it was in the patient's best interest to proceed with intervention after these images to avoid a second procedure and a larger amount of contrast and fluoroscopy based off of the findings from the initial angiogram. The patient was systemically heparinized and a 6 French Ansell sheath was then placed over the [employer] wire. I then used a Kumpe catheter and the advantage wire to easily cross the embolic and thrombotic occlusion in the right SFA and popliteal artery.  I got down into the peroneal artery and confirmed intraluminal flow distally.  I then placed 0.014 command wire.  I then performed mechanical thrombectomy for what appeared to be thrombosis below likely embolic event above the previously placed stent.  We used a penumbra 6 French lightening bolt mechanical thrombectomy device.  We then made 3 passes with the 6 French lightening bolt penumbra mechanical thrombectomy catheter in the right SFA, popliteal artery, and down into the tibioperoneal trunk.  Imaging following this still showed a circular defect just above the previously placed stents consistent with a likely A-fib embolus and some residual thrombus in the tail distally through the previous stent and down into the popliteal artery.  I elected to cover these.  A 6 mm diameter by 25 cm length Viabahn stent was started about 5 cm above the circular defect in the mid SFA.  This was taken down to the proximal to mid popliteal artery.  This was deployed postdilated with a 5 mm balloon.  There was still some narrowing and possible thrombus in the popliteal artery just below this so I elected to add an additional stent and a 5 mm diameter by 10 cm length Viabahn stent was selected and deployed to just above the right anterior tibial artery.  This was postdilated with a 4 mm balloon.  Completion imaging showed excellent flow through the stents without any significant residual  stenosis or thrombus after stent placement in these locations. I elected to terminate the procedure. The sheath was removed and Celt closure device was deployed in the left femoral artery with excellent hemostatic result. The patient was taken to the recovery room in stable condition having tolerated the procedure well.  Findings:               Aortogram:  This demonstrated normal renal arteries and normal aorta and iliac segments without significant stenosis after previous right iliac stent.             Right Lower Extremity:  This demonstrated the right common femoral artery, profunda femoris artery, and proximal superficial femoral artery were all patent without stenosis.  There was an abrupt occlusion of the right SFA about 4 to 5 cm above the previously placed stent.  There is essentially no flow distally on the initial images.   Disposition: Patient was taken to the recovery room in stable condition having tolerated the procedure  well.  Complications: None  Jenny [name] 12/01/2024 2:24 PM   This note was created with Dragon Medical transcription system. Any errors in dictation are purely unintentional.

## 2024-12-01 NOTE — Progress Notes (Signed)
" °   12/01/24 0915  Spiritual Encounters  Type of Visit Follow up  Care provided to: Pt and family  Conversation partners present during encounter Nurse  Referral source Other (comment) (Patient requested daily visits.)  Reason for visit Routine spiritual support  OnCall Visit No   Patient requested daily Chaplain visits so Chaplain went to visit patient.  Son was at bedside so Chaplain said she'd return later in the day.    Rev. Rana M. Nicholaus, M.Div. Chaplain Resident [facility] "

## 2024-12-01 NOTE — Consult Note (Signed)
 Pharmacy Consult Note - Anticoagulation  Pharmacy Consult for heparin  Indication: atrial fibrillation and DVT  PATIENT MEASUREMENTS: Height: 5' 2 (157.5 cm) Weight: 85.9 kg (189 lb 6 oz) IBW/kg (Calculated) : 50.1 HEPARIN  DW (KG): 71.4  VITAL SIGNS: Temp: 98.1 F (36.7 C) (12/24 1200) Temp Source: Tympanic (12/24 1200) BP: 111/69 (12/24 1500) Pulse Rate: 73 (12/24 1500)  Recent Labs    11/29/24 0101 11/29/24 0940  HGB 8.5*  --   HCT 29.0*  --   PLT 432*  --   HEPARINUNFRC 0.37 0.10*  CREATININE 1.30*  --     Estimated Creatinine Clearance: 29.8 mL/min (A) (by C-G formula based on SCr of 1.3 mg/dL (H)).  PAST MEDICAL HISTORY: Past Medical History:  Diagnosis Date   ACUT DUOD ULCER W/HEMORR&PERF W/O MENTION OBST 10/05/2009   NSAID induced   ALLERGIC RHINITIS CAUSE UNSPECIFIED    ANEMIA-NOS    CAD (coronary artery disease) 06/08/2009   DEs RCA with 70% LAD and EF 60%   CHF (congestive heart failure) (HCC)    COPD    mild obst on PFTs 03/2010   Diabetes mellitus 06/2010 dx   Mild, diet controlled   GLAUCOMA    HYPERLIPIDEMIA    HYPERTENSION, BENIGN    MYOCARDIAL INFARCTION 06/08/2009   des to rca   Persistent atrial fibrillation (HCC)    Dx 08/2021   TOBACCO ABUSE     ASSESSMENT: 89 y.o. female with PMH including Afib on Eliquis , tachybradycardia syndrome, CAD, HTN is presenting with RLE ischemia in setting of thrombus now s/p thrombectomy. Most recent dose of Eliquis  was this morning.  Patient had right iliac and popliteal stents placed 12/10. The past two days patient has endorsed RLE pain and the area has become ischemic -- vasc took patient back to cath lab 12/24 for emergent removal of thrombus located in right SFA and popliteal artery above previous stents. Additional stenting also performed.  Pharmacy has been consulted to initiate and manage heparin  intravenous infusion.  Pertinent medications: Eliquis  2.5 mg twice daily, most recent dose 12/24  morning SubQ Heparin  5000 units x 1 this afternoon in cath lab  Goal(s) of therapy: Heparin  level 0.3 - 0.7 units/mL aPTT 66 - 102 seconds Monitor platelets by anticoagulation protocol: Yes   Baseline anticoagulation labs: Recent Labs    11/28/24 1543 11/29/24 0101  HGB 9.2* 8.5*  PLT 434* 432*   Baseline labs have been ordered    PLAN: Start heparin  infusion at 1100 units/hour. No bolus per vascular. Check aPTT and heparin  level in 8 hours after drip start Continue to titrate by aPTT until heparin  level and aPTT correlate, then titrate by heparin  level alone. Check heparin  level with next AM labs. Continue to monitor CBC daily while on heparin  infusion.  Will M. Lenon, PharmD, BCPS Clinical Pharmacist 12/01/2024 3:17 PM

## 2024-12-01 NOTE — Progress Notes (Addendum)
 " Progress Note   Patient: Jenny [name] FMW:[id number] DOB: 1933/08/02 DOA: 11/14/2024     17 DOS: the patient was seen and examined on 12/01/2024   Brief hospital course:  Jenny [name] is a 89 y.o. female with medical history significant for HFpEF, hypertension, persistent A-fib on Eliquis , tachybradycardia syndrome, CAD, COPD, CKD stage 3b, type 2 diabetes, anemia. Presents to ED from [facility] (rehab), 3 days LE edema and R leg pain 1 day, also SOB   12/07: to ED early hours AM. CTA chest showed tiny segmental PE left lower lobe without heart strain, extensive multivessel CAD and mild asymmetric pulmonary edema around the left hilum. Initially was treated with DuoNebs and Solu-Medrol . She initially received LR boluses but subsequently given a dose of Lasix  as test results came in. Started on vancomycin  and Rocephin  and also on a heparin  infusion for PE. Admitted with question sepsis d/t lower extremity cellulitis, vs non-infectious SIRS multifactorial including acute dyspnea secondary to COPD and CHF exacerbation. Podiatry consulted re: venous stasis ulcers on feet - bedisde debridement, appear superficial and recs for daily dressing changes, ACE wraps to knee, po abx on dc, WBAT, f/u outpatient 1 week after discharge   12/08: Vascular surgery consult d/t decreased ABI / PE> No thrombectomy given small PE. RLE angio planned for 12/10 12/09: stable 12/10: LLE angio and stents today. Cr 1.99 12/11: renal fxn improving Cr 1.56. Vascular surgery plans on taking the patient to the vascular lab on Monday, 11/22/2024 for RLE angiogram - pt to remain on heparin  infusion until after procedure  12/12: doing well / stable.  12/13: VERY short of breath w/ minimal exertion but improves quickly w/ rest. Changed nebs, will give 1 dose IV lasix  today  12/14: good UOP w/ IV lasix , increased po dose today. Still quite SOB, wheezing. (+)influenza A --> tamiflu , postponing angio 12/15-12/16: continue tx  COPD exacerbation d/t influenza. Tentative reschedule angio for 12/18 12/17 -still short of breath but improved.  Remains on 2 L of oxygen via nasal cannula.  Unable to lay flat 12/18 -still short of breath but improved.  Right lower extremity angiogram deferred since patient is unable to lay flat.  Planned for 12/22 12/19 -complains of worsening shortness of breath.  Noted to have wheezing on exam. 12/20 - Feels better 12/21 -noted to be short of breath at rest.  Had conversational dyspnea 12/22 -appears comfortable and in no distress.  Scheduled for lower extremity angiogram today 12/23 -complains of severe pain in her right lower extremity.  Son at the bedside requesting for psych evaluation due to severe anxiety and agitation. 12/24 -patient continues to complain of lower extremity pain mostly in her right lower extremity with no relief from pain medications.  Seen by vascular surgery, right leg is cold, purple and insensate, concern for an acute embolus.  Plans to take to surgery today.  Patient is a poor surgical candidate and is not high risk of limb loss.          Assessment and Plan:  Acute ischemia involving right lower extremity History of PAD S/p angiogram RLE w/ stenting 11/17/24.  Developed severe pain in the last 24 hours with no relief from opioids Seen by vascular surgery and noted to have cold, palpable and insensate lower extremity. Patient will be taken emergently to the OR. Per vascular surgery she is at high risk of limb loss but she and her son agreeable to  right lower extremity angiography Patient is  status post left lower extremity percutaneous transluminal angioplasty of left common femoral artery 12/22 Patient is s/p angiogram which showed an embolus lodging just above the old stent. She is s/p  thrombectomy with stent angioplasty - two new stents.  Vascular recommends to continue heparin  drip for the next 24-48 hours and then potentially switch her back to  Eliquis , but would probably do 5 mg bid instead of the 2.5 .  Pain control.     LE edema / erythema  Multifactorial, include: Venous stasis dermatitis / ulceration  Cellulitis  Peripheral arterial disease LE edema d/t CHF / HFpEF exacerbation  Treat underlying cause(s) as noted    Dyspnea, multifactorial Improved COPD likely exacerbated by influenza A infection.  Has completed a 5-day course of Tamiflu  CHF appeared to be acutely exacerbate, .2/21. Continue Lasix  40 IV daily .  Monitor renal function closely. Continue bronchodilator therapy and changed to 3 times daily as well as inhaled steroids [name] to maintain pulse oximetry greater than 92% Tx Flu, COPD, and CHF as noted below     COPD  Chronic hypoxic respiratory failure on 2L Jenny [name] at baseline  Exacerbated now d/t influenza A Arrives w/ prednisone  20 on med list, says has been maintained on that for a couple of months prednisone  switched back to 10 daily (12/19) --> burst 40 mg daily x 5 days given new exacerbation, then ordered back to 10 mg daily  Continue [name] at 2 L Completed course of Tamiflu  Continue schedule nebs --> budesonide  bid, duoneb TID while awake Albuterol  prn  will need close outpt pulm f/u    Venous stasis  Venous stasis ulcers lower extremities bilaterally Question Cellulitis daily dressing changes, ACE wraps to knee Completed course of Keflex  WBAT f/u podiatry outpatient 1 week after discharge      Acute PE Very small.  Patient remains on a heparin  drip    HFpEF with acute exacerbation - Last known LVEF of 60 to 65% with no regional wall motion abnormality and moderate asymmetric LVH of the basal septal segment, 2D echocardiogram which was done 07/25 Continue Lasix  40 mg daily monitor renal function closely Monitor renal function closely    Hyperkalemia - resolved  Monitor BMP   AKI on ckd 3b - resolved  Cr 1.39 on admission --> peak at 2.09 on 12/08 -->  11/21/2024 1.29 Renal function is stable Monitor closely    History tachy-brady A-fib Rate controlled currently Continue IV heparin  Eliquis  is on hold   Elevated troponin, ACS ruled out  Mild, stable, flat, likely demand as no chest pain Recheck troponin / EKG prn chest pain.    Neuropathy home gabapentin    Pulm nodule outpt f/u       Debility Comes from rehab PT consulted patient does not want to return to [facility]. TOC aware, looking elsewhere but if refusing available facility then will have to DC to home instead if unable to arrange alternative placement        Class 2 / borderline Class 3 obesity based on BMI:  Body mass index is 35.32kg/m. Complicates overall prognosis and care            Subjective: Continues to complain of pain in her right lower extremity  Physical Exam: Vitals:   12/01/24 0450 12/01/24 0500 12/01/24 0747 12/01/24 1200  BP: 123/72  126/65 139/68  Pulse: (!) 54  64 69  Resp: 17  16 20   Temp: (!) 97.5 F (36.4 C)  98 F (36.7 C) 98.1 F (36.7 C)  TempSrc:    Tympanic  SpO2: 100%  100% 100%  Weight:  85.9 kg  85.9 kg  Height:    5' 2 (1.575 m)   General: Chronically ill-appearing, appears to be in painful distress HEENT : Pale conjunctiva, anicteric sclera Cardiac:  RRR, Normal S1, S2. No murmurs. Lungs: Rales at the bases, scattered rhonchi, scattered wheezes in all lung fields Cardiovascular : RRR S1,S2  Abdomen:  Positive bowel sounds throughout, morbid obesity, soft non tender  Neurologic: AAOX3 answers all questions and follows commands.  Extremities:  All extremities are warm to touch with palpable pulses in upper extremities. Lower extremities are bandaged   Data Reviewed:  There are no new results to review at this time.  Family Communication: Plan of care discussed with patient's son at the bedside.  He verbalizes understanding and agrees with the plan  Disposition: Status is: Inpatient Remains  inpatient appropriate because: For right lower extremity angiography  Planned Discharge Destination: Skilled nursing facility    Time spent: 50 minutes  Author: Aimee Somerset, MD 12/01/2024 12:37 PM  For on call review [email or web address].  "

## 2024-12-01 NOTE — H&P (View-Only) (Signed)
 [facility]  Daily Progress Note   Subjective  -   Patient complaining of right foot pain.  This has been hurting her for weeks but there is been an abrupt change in the past 24 hours clinically.  This was warm with good pulses yesterday during the dressing change but it is now cold, purple, and insensate this morning.  Left foot remains warm with good capillary refill.  Objective Vitals:   11/30/24 2109 12/01/24 0450 12/01/24 0500 12/01/24 0747  BP: (!) 138/59 123/72  126/65  Pulse: 80 (!) 54  64  Resp: 16 17  16   Temp: (!) 97.5 F (36.4 C) (!) 97.5 F (36.4 C)  98 F (36.7 C)  TempSrc:      SpO2: 100% 100%  100%  Weight:   85.9 kg   Height:       No intake or output data in the 24 hours ending 12/01/24 0941   CV  irregular VASC  left foot is warm with good capillary refill and good pulses.  Right foot is cool, cyanotic, and insensate   Laboratory CBC    Component Value Date/Time   WBC 10.6 (H) 11/29/2024 0101   HGB 8.5 (L) 11/29/2024 0101   HCT 29.0 (L) 11/29/2024 0101   PLT 432 (H) 11/29/2024 0101    BMET    Component Value Date/Time   NA 141 11/29/2024 0101   NA 139 10/21/2024 1046   K 3.8 11/29/2024 0101   CL 93 (L) 11/29/2024 0101   CO2 38 (H) 11/29/2024 0101   GLUCOSE 224 (H) 11/29/2024 0101   BUN 32 (H) 11/29/2024 0101   BUN 48 (H) 10/21/2024 1046   CREATININE 1.30 (H) 11/29/2024 0101   CALCIUM  8.3 (L) 11/29/2024 0101   GFRNONAA 39 (L) 11/29/2024 0101   GFRAA  10/09/2009 0350    >60        The eGFR has been calculated using the MDRD equation. This calculation has not been validated in all clinical situations. eGFR's persistently <60 mL/min signify possible Chronic Kidney Disease.    Assessment/Planning: POD # 14 s/p right iliac and popliteal stent placement postop day 2 status post left common femoral artery angioplasty  Right foot has had a significant change in the past 24 hours and now looks very ischemic. The  patient is insensate in the right foot and the foot is cool and purple.  Left foot is warm with excellent capillary refill and normal sensation. This is most concerning for an acute cardiac embolus or some other acute ischemic event Even though she had breakfast this morning, since she is insensate I think this is now an urgent issue and we will get her down to the angio suite as soon as possible I discussed with the patient that I think the risk of limb loss is exceedingly high at this point She is a very poor surgical candidate with her respiratory status and other issues This is a very difficult and challenging situation to say the least   Selinda Gu  12/01/2024, 9:41 AM

## 2024-12-01 NOTE — Progress Notes (Signed)
 PT Cancellation Note  Patient Details Name: KAYLAN YATES MRN: [id number] DOB: 07/23/1933   Cancelled Treatment:    Reason Eval/Treat Not Completed: Medical issues which prohibited therapy (Chart reviewed for planned therapy session.  Per notes, patient with noted vascular changes to R LE; now pending emergent vascular intervention this date. Will hold therapy for now and resume as medically appropriate.)   Rhapsody Wolven H. Delores, PT, DPT, NCS 12/01/2024, 11:16 AM [phone]

## 2024-12-01 NOTE — Progress Notes (Signed)
 The patient denies having any leg pain and refused pain medication. This writer told the patient to call if she needed anything for pain.

## 2024-12-01 NOTE — Progress Notes (Signed)
 [facility]  Daily Progress Note   Subjective  -   Patient complaining of right foot pain.  This has been hurting her for weeks but there is been an abrupt change in the past 24 hours clinically.  This was warm with good pulses yesterday during the dressing change but it is now cold, purple, and insensate this morning.  Left foot remains warm with good capillary refill.  Objective Vitals:   11/30/24 2109 12/01/24 0450 12/01/24 0500 12/01/24 0747  BP: (!) 138/59 123/72  126/65  Pulse: 80 (!) 54  64  Resp: 16 17  16   Temp: (!) 97.5 F (36.4 C) (!) 97.5 F (36.4 C)  98 F (36.7 C)  TempSrc:      SpO2: 100% 100%  100%  Weight:   85.9 kg   Height:       No intake or output data in the 24 hours ending 12/01/24 0941   CV  irregular VASC  left foot is warm with good capillary refill and good pulses.  Right foot is cool, cyanotic, and insensate   Laboratory CBC    Component Value Date/Time   WBC 10.6 (H) 11/29/2024 0101   HGB 8.5 (L) 11/29/2024 0101   HCT 29.0 (L) 11/29/2024 0101   PLT 432 (H) 11/29/2024 0101    BMET    Component Value Date/Time   NA 141 11/29/2024 0101   NA 139 10/21/2024 1046   K 3.8 11/29/2024 0101   CL 93 (L) 11/29/2024 0101   CO2 38 (H) 11/29/2024 0101   GLUCOSE 224 (H) 11/29/2024 0101   BUN 32 (H) 11/29/2024 0101   BUN 48 (H) 10/21/2024 1046   CREATININE 1.30 (H) 11/29/2024 0101   CALCIUM  8.3 (L) 11/29/2024 0101   GFRNONAA 39 (L) 11/29/2024 0101   GFRAA  10/09/2009 0350    >60        The eGFR has been calculated using the MDRD equation. This calculation has not been validated in all clinical situations. eGFR's persistently <60 mL/min signify possible Chronic Kidney Disease.    Assessment/Planning: POD # 14 s/p right iliac and popliteal stent placement postop day 2 status post left common femoral artery angioplasty  Right foot has had a significant change in the past 24 hours and now looks very ischemic. The  patient is insensate in the right foot and the foot is cool and purple.  Left foot is warm with excellent capillary refill and normal sensation. This is most concerning for an acute cardiac embolus or some other acute ischemic event Even though she had breakfast this morning, since she is insensate I think this is now an urgent issue and we will get her down to the angio suite as soon as possible I discussed with the patient that I think the risk of limb loss is exceedingly high at this point She is a very poor surgical candidate with her respiratory status and other issues This is a very difficult and challenging situation to say the least   Selinda Gu  12/01/2024, 9:41 AM

## 2024-12-01 NOTE — Consult Note (Signed)
 [facility] Psychiatric Consult Follow Up  Patient Name: .Jenny [name]  MRN: [id number]  DOB: 12/29/1932  Consult Order details:  Orders (From admission, onward)     Start     Ordered   11/30/24 1100  IP CONSULT TO PSYCHIATRY       Ordering Provider: Lanetta Lingo, MD  Provider:  Ruther Millie SAUNDERS, MD  Question Answer Comment  Location [facility]   Reason for Consult? Anxiety. Extensive psych history      11/30/24 1100             Mode of Visit: In person    Psychiatry Consult Evaluation  Service Date: December 01, 2024 LOS:  LOS: 17 days  Chief Complaint I had difficulty breathing and in lots of pain  Primary Psychiatric Diagnoses  Anxiety   Assessment   Noted from MD note in chart Family Communication: Had an extensive conversation with patient's son at the bedside who is requesting behavioral health evaluation for anxiety and acute agitation.  Stated that his mother had called him on 12/22 and was very tearful and agitated.  He would want her started on medication prior to discharge to the skilled nursing facility.     Patient does not currently meet criteria for involuntary commitment. She does not meet criteria for nonemergent forced medications. Patient appears to retain decision-making capacity for her medical care, and as such, psychiatric medications cannot be administered without her informed consent. Psychiatry will remain available for consultation as needed and will attempt to obtain collateral information from her son. It should be noted that while the son reportedly expressed concern that patient should be started on medications prior to transfer, patient maintains the right to refuse treatment and is not a candidate for nonemergent forced medications at this time.  Patient currently denied suicidal homicidal ideations.On current presentation there was no evidence of psychosis or mania and patient did not appear to be responding to  internal stimuli. At this time, patient does not appear to be a risk to self or others.While future psychiatric events cannot be accurately predicted, the patient does not currently require acute inpatient psychiatric care and does not currently meet [facility]  involuntary commitment criteria.   12/01/2024: This provider reassessed patient today.  Of note, patient's son was physically present at bedside today.  Patient gave consent for this interviewer to continue assessment with son present at bedside.  Patient denied current suicidal or homicidal ideations.  Patient denied auditory or visual hallucinations as well.  Patient did still endorse increased anxiety, which she feels are fueled primarily by significant health changes and having to be in the hospital.  Patient's son voiced concerns about patient's anxiety level and panic.  He reported that patient is noted to get extremely irritable and lash out when she is anxious.  Patient confirmed these reports and reported when she becomes very anxious she feels like cussing someone out.  We brought back up the discussion today about potential medication management to help assist with patient's anxiety levels.  We discussed thoroughly the side effects, benefits, and risk of multiple medications.  Via shared decision making, patient was agreeable to initiating low-dose sertraline  daily.  Patient gave consent for this provider to place the order in her chart.  We discussed the need for every day dosing and continue to take this medication to see its maximum effects.  We also discussed that it can take 4 to 6 weeks to see therapeutic results from this  medication.  Patient and son verbalized understanding at this time.  From review of charts and patient discussion today, it appears that patient is undergoing emergency vascular procedure today.  We will hold off on initiating medications today and start the medication tomorrow if patient medically cleared to do  so.  Diagnoses:  Active Hospital problems: Principal Problem:   Bilateral lower leg cellulitis Active Problems:   Essential hypertension   CAD (coronary artery disease)   Tachycardia-bradycardia syndrome (HCC)   Stage 3b chronic kidney disease (HCC)   Type 2 diabetes mellitus with diabetic chronic kidney disease (HCC)   Type II diabetes mellitus with peripheral circulatory disorder (HCC)   Acute on chronic heart failure with preserved ejection fraction (HFpEF) (HCC)   COPD with acute exacerbation (HCC)   CKD (chronic kidney disease) stage 4, GFR 15-29 ml/min (HCC)   Acute pulmonary embolism (HCC)   Permanent atrial fibrillation (HCC)   Obesity, Class III, BMI 40-49.9 (morbid obesity) (HCC)   Sepsis (HCC)   PAD (peripheral artery disease)    Plan   ## Psychiatric Medication Recommendations:  With patient report of symptoms occurring relatively recently (3 weeks ago), it is noted that patient is currently receiving prednisone  daily therapy. This could be contributing to reported increased agitation and the symptoms listed below under HPI  -Initiate sertraline  25 mg daily starting tomorrow pending medical clearance to initiate this medication.  This medication was chosen due to its safety profile in geriatric patients and tolerability.  ## Medical Decision Making Capacity: Not specifically addressed in this encounter  ## Further Work-up:   -- most recent EKG on 11/20/2024 had QtC of 474 -- Pertinent labwork reviewed earlier this admission includes: CBC BMP   ## Disposition:-- There are no psychiatric contraindications to discharge at this time  ## Behavioral / Environmental: - No specific recommendations at this time.     ## Safety and Observation Level:  - Based on my clinical evaluation, I estimate the patient to be at low risk of self harm in the current setting. - At this time, we recommend  routine. This decision is based on my review of the chart including patient's  history and current presentation, interview of the patient, mental status examination, and consideration of suicide risk including evaluating suicidal ideation, plan, intent, suicidal or self-harm behaviors, risk factors, and protective factors. This judgment is based on our ability to directly address suicide risk, implement suicide prevention strategies, and develop a safety plan while the patient is in the clinical setting. Please contact our team if there is a concern that risk level has changed.  CSSR Risk Category:C-SSRS RISK CATEGORY: No Risk  Suicide Risk Assessment: Patient has following modifiable risk factors for suicide: triggering events, which we are addressing by utilizing therapeutic communication to give patient safe space to discuss concerns. Patient has following non-modifiable or demographic risk factors for suicide: N/A Patient has the following protective factors against suicide: Supportive family, no history of suicide attempts, and no history of NSSIB  Thank you for this consult request. Recommendations have been communicated to the primary team.  We will continue to monitor at this time.   Zelda Sharps, NP        History of Present Illness  Relevant Aspects of [facility]   Patient Report:  Psychiatry consulted by medical team per son's request due to concerns for anxiety and reported agitation. On examination today, patient presented as calm and cooperative with this provider. Patient attributes her presentation to significant  difficulty breathing and pain experienced yesterday. Patient was able to recall her reason for initial hospitalization. She reported receiving Social Security benefits and manages her own finances with assistance from her son. She stated the current plan involves transfer to a rehabilitation facility, with social work actively assisting in placement options.  Patient endorsed increased anxiety related to her current medical situation and  multiple stressors, including her recent illness and the discovery of black mold in her apartment. She expressed hesitancy to initiate any new medications, noting she is already on numerous medications. When asked specifically about auditory and visual hallucinations, patient stated that is a good question and went on to describe brief, random episodes over the past 3 weeks in which she visualizes what she presumes to be healthcare workers that disappear when she attempts to look directly at them. She clarified these episodes began approximately 3 weeks ago, coinciding with her hospitalization, and have never occurred prior to this timeframe.  Patient denied suicidal or homicidal ideations. She denied any known mental health history, previous self-harm, or previous inpatient psychiatric admissions. She denied access to weapons or any upcoming legal charges. On mental status examination, there was no evidence of mania or psychosis, and patient did not appear to be responding to internal stimuli.  Psych ROS:  Depression: Denied Anxiety:  Endorsed Mania (lifetime and current): Denied Psychosis: (lifetime and current): Denied      Psychiatric and Social History  Psychiatric History:  Information collected from Patient/chart review  Prev Dx/Sx: Anxiety None Current Psych Provider: None Home Meds (current): Denied psychotropic medications Previous Med Trials: Denied Therapy: Denied  Prior Psych Hospitalization: Denied Prior Self Harm: Denied Prior Violence: Denied  Family Psych History: Unknown Family Hx suicide: Unknown  Social History:    Occupational Hx: Retired, reported receives SSI Legal Hx: Denied Living Situation: Plan to transition to a different skilled nursing facility Access to weapons/lethal means: Denied  Substance History Alcohol : Denied Tobacco: Denied Illicit drugs: Denied Prescription drug abuse: Denied Rehab hx: Denied  Exam Findings  Physical Exam:  Reviewed and agree with the physical exam findings conducted by the medical provider Vital Signs:  Temp:  [97.5 F (36.4 C)-98.1 F (36.7 C)] 98.1 F (36.7 C) (12/24 1200) Pulse Rate:  [54-80] 69 (12/24 1200) Resp:  [16-20] 20 (12/24 1200) BP: (123-139)/(59-78) 139/68 (12/24 1200) SpO2:  [95 %-100 %] 100 % (12/24 1200) Weight:  [85.9 kg] 85.9 kg (12/24 1200) Blood pressure 139/68, pulse 69, temperature 98.1 F (36.7 C), temperature source Tympanic, resp. rate 20, height 5' 2 (1.575 m), weight 85.9 kg, SpO2 100%. Body mass index is 34.64 kg/m.    Mental Status Exam: General Appearance: Casual  Orientation:  Full (Time, Place, and Person)  Memory:  Fair  Concentration:  Concentration: Good  Recall:  Good  Attention  Good  Eye Contact:  Good  Speech:  Clear and Coherent  Language:  Fair  Volume:  Normal  Mood: Anxious  Affect:  Congruent  Thought Process:  Coherent, Goal Directed, and Linear  Thought Content:  Logical  Suicidal Thoughts:  No  Homicidal Thoughts:  No  Judgement:  Fair  Insight:  Fair  Psychomotor Activity:  Normal  Akathisia:  No  Fund of Knowledge:  Fair      Assets:  Manufacturing Systems Engineer Desire for Improvement Financial Resources/Insurance Housing Social Support  Cognition:  WNL  ADL's:  Impaired  AIMS (if indicated):        Other History   These have been  pulled in through the EMR, reviewed, and updated if appropriate.  Family History:  The patient's family history includes Hypertension in her mother; Stomach cancer in her father; Stroke in her maternal grandmother; Ulcers in her mother.  Medical History: Past Medical History:  Diagnosis Date   ACUT DUOD ULCER W/HEMORR&PERF W/O MENTION OBST 10/05/2009   NSAID induced   ALLERGIC RHINITIS CAUSE UNSPECIFIED    ANEMIA-NOS    CAD (coronary artery disease) 06/08/2009   DEs RCA with 70% LAD and EF 60%   CHF (congestive heart failure) (HCC)    COPD    mild obst on PFTs 03/2010   Diabetes  mellitus 06/2010 dx   Mild, diet controlled   GLAUCOMA    HYPERLIPIDEMIA    HYPERTENSION, BENIGN    MYOCARDIAL INFARCTION 06/08/2009   des to rca   Persistent atrial fibrillation (HCC)    Dx 08/2021   TOBACCO ABUSE     Surgical History: Past Surgical History:  Procedure Laterality Date   CARDIOVERSION N/A 01/30/2022   Procedure: CARDIOVERSION;  Surgeon: Alveta Aleene PARAS, MD;  Location: [facility] ENDOSCOPY;  Service: Cardiovascular;  Laterality: N/A;   HEMORRHOID SURGERY  1990   LOWER EXTREMITY ANGIOGRAPHY Right 11/17/2024   Procedure: Lower Extremity Angiography;  Surgeon: Marea Selinda RAMAN, MD;  Location: [facility] INVASIVE CV LAB;  Service: Cardiovascular;  Laterality: Right;   LOWER EXTREMITY ANGIOGRAPHY Left 11/29/2024   Procedure: Lower Extremity Angiography;  Surgeon: Marea Selinda RAMAN, MD;  Location: [facility] INVASIVE CV LAB;  Service: Cardiovascular;  Laterality: Left;   Right knee surgery       Medications:  Current Medications[1]  Allergies: Allergies[2]  Zelda Sharps, NP This note was created using Dragon dictation software. Please excuse any inadvertent transcription errors. Case was discussed with supervising physician Dr. Jadapalle who is agreeable with current plan.       [1]  Current Facility-Administered Medications:    0.9 %  sodium chloride  infusion, , Intravenous, Continuous, Pace, Brien R, NP, Last Rate: 75 mL/hr at 12/01/24 1209, New Bag at 12/01/24 1209   [MAR Hold] acetaminophen  (TYLENOL ) tablet 650 mg, 650 mg, Oral, Q6H PRN, 650 mg at 11/29/24 2145 **OR** [MAR Hold] acetaminophen  (TYLENOL ) suppository 650 mg, 650 mg, Rectal, Q6H PRN, Dew, Jason S, MD   [MAR Hold] albuterol  (PROVENTIL ) (2.5 MG/3ML) 0.083% nebulizer solution 2.5 mg, 2.5 mg, Nebulization, Q2H PRN, Marea Selinda RAMAN, MD, 2.5 mg at 11/29/24 2021   [facility] Hold] apixaban  (ELIQUIS ) tablet 2.5 mg, 2.5 mg, Oral, BID, Agbata, Tochukwu, MD, 2.5 mg at 12/01/24 0948   [MAR Hold] budesonide  (PULMICORT ) nebulizer solution 0.25 mg,  0.25 mg, Nebulization, BID, Dew, Jason S, MD, 0.25 mg at 12/01/24 0754   ceFAZolin  (ANCEF ) IVPB 2g/100 mL premix, 2 g, Intravenous, 30 min Pre-Op, Pace, Brien R, NP   diphenhydrAMINE  (BENADRYL ) injection 50 mg, 50 mg, Intravenous, Once PRN, Pace, Brien R, NP   [MAR Hold] dorzolamide  (TRUSOPT ) 2 % ophthalmic solution 1 drop, 1 drop, Both Eyes, BID, Dew, Selinda RAMAN, MD, 1 drop at 12/01/24 1021   famotidine  (PEPCID ) tablet 40 mg, 40 mg, Oral, Once PRN, Pace, Brien R, NP   [MAR Hold] furosemide  (LASIX ) tablet 40 mg, 40 mg, Oral, Daily, Agbata, Tochukwu, MD, 40 mg at 12/01/24 0948   [MAR Hold] gabapentin  (NEURONTIN ) capsule 100 mg, 100 mg, Oral, Daily, Dew, Jason S, MD, 100 mg at 12/01/24 0947   [MAR Hold] guaiFENesin  (MUCINEX ) 12 hr tablet 600 mg, 600 mg, Oral, BID, Dew, Jason S, MD, 600 mg  at 12/01/24 0948   [MAR Hold] hydrocerin (EUCERIN) cream, , Topical, BID, Dew, Selinda RAMAN, MD, Given at 12/01/24 1020   [MAR Hold] HYDROcodone -acetaminophen  (NORCO/VICODIN) 5-325 MG per tablet 1 tablet, 1 tablet, Oral, Q6H PRN, Agbata, Tochukwu, MD, 1 tablet at 12/01/24 1123   [MAR Hold] hydrOXYzine  (ATARAX ) tablet 25 mg, 25 mg, Oral, Q8H PRN, Dew, Jason S, MD, 25 mg at 11/29/24 2003   [MAR Hold] insulin  aspart (novoLOG ) injection 0-9 Units, 0-9 Units, Subcutaneous, TID WC, Dew, Jason S, MD, 1 Units at 12/01/24 0900   [MAR Hold] ipratropium-albuterol  (DUONEB) 0.5-2.5 (3) MG/3ML nebulizer solution 3 mL, 3 mL, Nebulization, TID, Dew, Jason S, MD, 3 mL at 12/01/24 0754   [MAR Hold] latanoprost  (XALATAN ) 0.005 % ophthalmic solution 1 drop, 1 drop, Both Eyes, QHS, Dew, Selinda RAMAN, MD, 1 drop at 11/30/24 2102   methylPREDNISolone  sodium succinate (SOLU-MEDROL ) 125 mg/2 mL injection 125 mg, 125 mg, Intravenous, Once PRN, Pace, Brien R, NP   midazolam  (VERSED ) 2 MG/ML syrup 8 mg, 8 mg, Oral, Once PRN, Pace, Brien R, NP   [MAR Hold] nystatin  (MYCOSTATIN /NYSTOP ) topical powder, , Topical, BID, Dew, Selinda RAMAN, MD, Given at 12/01/24 1130    [MAR Hold] ondansetron  (ZOFRAN ) tablet 4 mg, 4 mg, Oral, Q6H PRN **OR** [MAR Hold] ondansetron  (ZOFRAN ) injection 4 mg, 4 mg, Intravenous, Q6H PRN, Dew, Selinda RAMAN, MD   [facility] Hold] Oral care mouth rinse, 15 mL, Mouth Rinse, PRN, Dew, Jason S, MD   [MAR Hold] pantoprazole  (PROTONIX ) EC tablet 40 mg, 40 mg, Oral, Daily, Dew, Jason S, MD, 40 mg at 12/01/24 0948   [MAR Hold] polyethylene glycol (MIRALAX  / GLYCOLAX ) packet 17 g, 17 g, Oral, Daily, Dew, Selinda RAMAN, MD, 17 g at 12/01/24 0948   [MAR Hold] predniSONE  (DELTASONE ) tablet 10 mg, 10 mg, Oral, Q breakfast, Dew, Selinda RAMAN, MD, 10 mg at 12/01/24 0947   [MAR Hold] rosuvastatin  (CRESTOR ) tablet 20 mg, 20 mg, Oral, Daily, Dew, Jason S, MD, 20 mg at 12/01/24 0949   [MAR Hold] senna-docusate (Senokot-S) tablet 2 tablet, 2 tablet, Oral, QHS, Marea Selinda RAMAN, MD, 2 tablet at 11/30/24 2058   [facility] Hold] sodium chloride  (OCEAN) 0.65 % nasal spray 1 spray, 1 spray, Each Nare, PRN, Dew, Jason S, MD, 1 spray at 11/29/24 2146   [facility] Hold] sodium phosphate  (FLEET) enema 1 enema, 1 enema, Rectal, Daily PRN, Marea Selinda RAMAN, MD, 1 enema at 11/21/24 1608 [2]  Allergies Allergen Reactions   Aspirin  Other (See Comments)    Can take 81 mg not 325 mg -bleeding   Entresto  [Sacubitril -Valsartan ] Shortness Of Breath    COPD and reports increased SOB with Entresto    Atorvastatin  Itching and Rash    Itching and myaliga     Cyclobenzaprine      muscle relaxers-ulcer hemorrhage (hospitalized), ulcer hemorrhage   Lactose Intolerance (Gi) Other (See Comments)    GI upset   Meperidine Hcl Other (See Comments)    Not known   Pravastatin  Rash   Propoxyphene     Hallucination, hallucinations, vomiting   Wound Dressing Adhesive Other (See Comments)    red, stings

## 2024-12-01 NOTE — Progress Notes (Signed)
 OT Cancellation Note  Patient Details Name: LAKIE MCLOUTH MRN: [id number] DOB: Oct 14, 1933   Cancelled Treatment:    Reason Eval/Treat Not Completed: Medical issues which prohibited therapy. Per notes, pt with significant vascular changes to RLE with emergent intervention planned. OT will hold therapy at this time and resume as medically appropriate.   Maya Scholer L. Aviva Wolfer, OTR/L  12/01/2024, 11:26 AM

## 2024-12-01 NOTE — Progress Notes (Signed)
 Patient clinically stable post VIR right leg angiogram with intervention, right toes still cool to touch, less blue than pre procedure. Faint doppler pulses audible post procedure. Dr Marea called and spoke with son with update given. Report called to patients nurse 215 A,. Left groin Celt closed with no bleeding nor hematoma at left groin. Patient resting at present time, with no complaints voiced.

## 2024-12-02 DIAGNOSIS — L03116 Cellulitis of left lower limb: Secondary | ICD-10-CM | POA: Diagnosis not present

## 2024-12-02 DIAGNOSIS — L03115 Cellulitis of right lower limb: Secondary | ICD-10-CM | POA: Diagnosis not present

## 2024-12-02 LAB — BASIC METABOLIC PANEL WITH GFR
Anion gap: 13 (ref 5–15)
BUN: 40 mg/dL — ABNORMAL HIGH (ref 8–23)
CO2: 34 mmol/L — ABNORMAL HIGH (ref 22–32)
Calcium: 8.1 mg/dL — ABNORMAL LOW (ref 8.9–10.3)
Chloride: 92 mmol/L — ABNORMAL LOW (ref 98–111)
Creatinine, Ser: 1.29 mg/dL — ABNORMAL HIGH (ref 0.44–1.00)
GFR, Estimated: 39 mL/min — ABNORMAL LOW
Glucose, Bld: 146 mg/dL — ABNORMAL HIGH (ref 70–99)
Potassium: 4.7 mmol/L (ref 3.5–5.1)
Sodium: 139 mmol/L (ref 135–145)

## 2024-12-02 LAB — GLUCOSE, CAPILLARY
Glucose-Capillary: 161 mg/dL — ABNORMAL HIGH (ref 70–99)
Glucose-Capillary: 183 mg/dL — ABNORMAL HIGH (ref 70–99)
Glucose-Capillary: 141 mg/dL — ABNORMAL HIGH (ref 70–99)
Glucose-Capillary: 176 mg/dL — ABNORMAL HIGH (ref 70–99)

## 2024-12-02 LAB — HEPARIN LEVEL (UNFRACTIONATED): Heparin Unfractionated: >1.1 [IU]/mL — ABNORMAL HIGH (ref 0.30–0.70)

## 2024-12-02 LAB — APTT
aPTT: 91 s — ABNORMAL HIGH (ref 24–36)
aPTT: 90 s — ABNORMAL HIGH (ref 24–36)

## 2024-12-02 MED ORDER — IPRATROPIUM-ALBUTEROL 0.5-2.5 (3) MG/3ML IN SOLN
3.0000 mL | Freq: Two times a day (BID) | RESPIRATORY_TRACT | Status: DC
  Administered 2024-12-02 – 2024-12-03 (×2): 3 mL via RESPIRATORY_TRACT
  Filled 2024-12-02 (×2): qty 3

## 2024-12-02 NOTE — Consult Note (Signed)
 Pharmacy Consult Note - Anticoagulation  Pharmacy Consult for heparin  Indication: atrial fibrillation and DVT  PATIENT MEASUREMENTS: Height: 5' 2 (157.5 cm) Weight: 85.5 kg (188 lb 6.4 oz) IBW/kg (Calculated) : 50.1 HEPARIN  DW (KG): 71.4  VITAL SIGNS: Temp: 99.2 F (37.3 C) (12/25 0744) Temp Source: Oral (12/25 0744) BP: 158/73 (12/25 0744) Pulse Rate: 76 (12/25 0744)  Recent Labs    12/01/24 1527 12/02/24 0051 12/02/24 0438 12/02/24 0836  HGB 8.1*  --   --   --   HCT 27.9*  --   --   --   PLT 393  --   --   --   APTT 139* 91*  --  90*  LABPROT 17.7*  --   --   --   INR 1.4*  --   --   --   HEPARINUNFRC >1.10* >1.10*  --   --   CREATININE  --   --  1.29*  --     Estimated Creatinine Clearance: 30 mL/min (A) (by C-G formula based on SCr of 1.29 mg/dL (H)).  PAST MEDICAL HISTORY: Past Medical History:  Diagnosis Date   ACUT DUOD ULCER W/HEMORR&PERF W/O MENTION OBST 10/05/2009   NSAID induced   ALLERGIC RHINITIS CAUSE UNSPECIFIED    ANEMIA-NOS    CAD (coronary artery disease) 06/08/2009   DEs RCA with 70% LAD and EF 60%   CHF (congestive heart failure) (HCC)    COPD    mild obst on PFTs 03/2010   Diabetes mellitus 06/2010 dx   Mild, diet controlled   GLAUCOMA    HYPERLIPIDEMIA    HYPERTENSION, BENIGN    MYOCARDIAL INFARCTION 06/08/2009   des to rca   Persistent atrial fibrillation (HCC)    Dx 08/2021   TOBACCO ABUSE     ASSESSMENT: 89 y.o. female with PMH including Afib on Eliquis , tachybradycardia syndrome, CAD, HTN is presenting with RLE ischemia in setting of thrombus now s/p thrombectomy. Most recent dose of Eliquis  was this morning.  Patient had right iliac and popliteal stents placed 12/10. The past two days patient has endorsed RLE pain and the area has become ischemic -- vasc took patient back to cath lab 12/24 for emergent removal of thrombus located in right SFA and popliteal artery above previous stents. Additional stenting also  performed.  Pharmacy has been consulted to initiate and manage heparin  intravenous infusion.  Pertinent medications: Eliquis  2.5 mg twice daily, most recent dose 12/24 morning SubQ Heparin  5000 units x 1 this afternoon in cath lab  Goal(s) of therapy: Heparin  level 0.3 - 0.7 units/mL aPTT 66 - 102 seconds Monitor platelets by anticoagulation protocol: Yes   Baseline anticoagulation labs: Recent Labs    12/01/24 1527 12/02/24 0051 12/02/24 0836  APTT 139* 91* 90*  INR 1.4*  --   --   HGB 8.1*  --   --   PLT 393  --   --    Baseline labs have been ordered  1225 0051 aPTT 91, therapeutic x 1 / HL > 1.1   PLAN: aPTT therapeutic x 2 Continue heparin  infusion at 1100 units/hour. Recheck next aPTT in am 12/26 Continue to titrate by aPTT until heparin  level and aPTT correlate, then titrate by heparin  level alone. Check heparin  level with next AM labs. Continue to monitor CBC daily while on heparin  infusion.  Adriana Bolster, PharmD, BCPS 12/02/2024 9:07 AM

## 2024-12-02 NOTE — Progress Notes (Signed)
 This RN Attempted to admin morning doses of medications. Patient Refused all. Notified Attending. She also refused to eat or drink this morning for breakfast. Patient will not open eyes but will answer to yes or no questions. Will continue to monitor.

## 2024-12-02 NOTE — Progress Notes (Signed)
 " Progress Note   Patient: Jenny [name] FMW:[id number] DOB: 05-Mar-1933 DOA: 11/14/2024     18 DOS: the patient was seen and examined on 12/02/2024   Brief hospital course:  CHARLEEN MADERA is a 89 y.o. female with medical history significant for HFpEF, hypertension, persistent A-fib on Eliquis , tachybradycardia syndrome, CAD, COPD, CKD stage 3b, type 2 diabetes, anemia. Presents to ED from [facility] (rehab), 3 days LE edema and R leg pain 1 day, also SOB   12/07: to ED early hours AM. CTA chest showed tiny segmental PE left lower lobe without heart strain, extensive multivessel CAD and mild asymmetric pulmonary edema around the left hilum. Initially was treated with DuoNebs and Solu-Medrol . She initially received LR boluses but subsequently given a dose of Lasix  as test results came in. Started on vancomycin  and Rocephin  and also on a heparin  infusion for PE. Admitted with question sepsis d/t lower extremity cellulitis, vs non-infectious SIRS multifactorial including acute dyspnea secondary to COPD and CHF exacerbation. Podiatry consulted re: venous stasis ulcers on feet - bedisde debridement, appear superficial and recs for daily dressing changes, ACE wraps to knee, po abx on dc, WBAT, f/u outpatient 1 week after discharge   12/08: Vascular surgery consult d/t decreased ABI / PE> No thrombectomy given small PE. RLE angio planned for 12/10 12/09: stable 12/10: LLE angio and stents today. Cr 1.99 12/11: renal fxn improving Cr 1.56. Vascular surgery plans on taking the patient to the vascular lab on Monday, 11/22/2024 for RLE angiogram - pt to remain on heparin  infusion until after procedure  12/12: doing well / stable.  12/13: VERY short of breath w/ minimal exertion but improves quickly w/ rest. Changed nebs, will give 1 dose IV lasix  today  12/14: good UOP w/ IV lasix , increased po dose today. Still quite SOB, wheezing. (+)influenza A --> tamiflu , postponing angio 12/15-12/16: continue tx  COPD exacerbation d/t influenza. Tentative reschedule angio for 12/18 12/17 -still short of breath but improved.  Remains on 2 L of oxygen via nasal cannula.  Unable to lay flat 12/18 -still short of breath but improved.  Right lower extremity angiogram deferred since patient is unable to lay flat.  Planned for 12/22 12/19 -complains of worsening shortness of breath.  Noted to have wheezing on exam. 12/20 - Feels better 12/21 -noted to be short of breath at rest.  Had conversational dyspnea 12/22 -appears comfortable and in no distress.  Scheduled for lower extremity angiogram today 12/23 -complains of severe pain in her right lower extremity.  Son at the bedside requesting for psych evaluation due to severe anxiety and agitation.       Assessment and Plan:   LE edema / erythema  Multifactorial, include: Venous stasis dermatitis / ulceration  Cellulitis  Peripheral arterial disease LE edema d/t CHF / HFpEF exacerbation  Treat underlying cause(s) as noted    Dyspnea, multifactorial COPD likely exacerbated by influenza A infection.  Has completed a 5-day course of Tamiflu  CHF appeared to be acutely exacerbate, .2/21. Continue Lasix  40 IV daily .  Monitor renal function closely. Continue bronchodilator therapy and changed to 3 times daily as well as inhaled steroids [place] O2 to maintain pulse oximetry greater than 92% Tx Flu, COPD, and CHF as noted below   COPD  Chronic hypoxic respiratory failure on 2L [place] O2 at baseline  Exacerbated now d/t influenza A Arrives w/ prednisone  20 on med list, says has been maintained on that for a couple of  months prednisone  switched back to 10 daily (12/19) --> burst 40 mg daily x 5 days given new exacerbation, then ordered back to 10 mg daily  Continue O2 supplementation at 2 L Completed course of Tamiflu  Continue schedule nebs --> budesonide  bid, duoneb TID while awake Albuterol  prn  will need close outpt pulm f/u   Venous stasis  Venous  stasis ulcers lower extremities bilaterally Question Cellulitis daily dressing changes, ACE wraps to knee Completed course of Keflex  Podiatry debrided ulcers on dorsum of feet WBAT f/u podiatry outpatient 1 week after discharge  Peripheral arterial disease  Had angiogram with right iliac and popliteal stent on 12/10. On 12/22 had left common femoral artery angioplasty. Then on 12/24 had emergent right angiobram with thrombectomy and stent placement for acute right leg ischemia.  Per vascular is now optimized, next step would be amputation Will continue IV heparin  for another day, consider apixaban  transition tomorrow    Acute PE Very small.  Anticoagulation as above   HFpEF with acute exacerbation - Last known LVEF of 60 to 65% with no regional wall motion abnormality and moderate asymmetric LVH of the basal septal segment, 2D echocardiogram which was done 07/25 Continue Lasix   40 oral daily Monitor renal function closely   Hyperkalemia - resolved  Monitor BMP   AKI on ckd 3b - resolved  monitor   History tachy-brady A-fib Rate controlled currently Anticoagulation as above   Elevated troponin, ACS ruled out  Mild, stable, flat, likely demand as no chest pain Recheck troponin / EKG prn chest pain.    Neuropathy home gabapentin    Pulm nodule outpt f/u  Debility Comes from rehab PT consulted patient does not want to return to [facility]. TOC aware, looking elsewhere but if refusing available facility then will have to DC to home instead if unable to arrange alternative placement   Class 2 / borderline Class 3 obesity based on BMI:  Body mass index is 35.32kg/m. Complicates overall prognosis and care   End of life care Poor prognosis. Discussion with son today. Considering transition to comfort care         Subjective: Lying in bed. Somnolent but rouses. No pain  Physical Exam: Vitals:   12/02/24 0332 12/02/24 0500 12/02/24 0744 12/02/24 0800  BP:  (!) 141/71  (!) 158/73   Pulse: 73  76   Resp: 20  20   Temp: 99.4 F (37.4 C)  99.2 F (37.3 C)   TempSrc: Oral  Oral   SpO2: 94%  96% 98%  Weight:  85.5 kg    Height:       General: Chronically ill-appearing, sleeping but arouses HEENT : Pale conjunctiva, anicteric sclera Cardiac:  RRR, Normal S1, S2. No murmurs. Lungs: scattered rhonchi Abdomen:  obese, soft, NT Neurologic: moving all 4, follows simple commands Extremities:  RLE purple but warm, left warm. Dressings on LEs   Data Reviewed:  There are no new results to review at this time.  Family Communication: son 12/25 telephonically  Disposition: Status is: Inpatient Remains inpatient appropriate because: Pain control postprocedure,  Planned Discharge Destination: Skilled nursing facility   Author: Devaughn KATHEE Ban, MD 12/02/2024 12:07 PM  For on call review [email or web address].  "

## 2024-12-02 NOTE — Plan of Care (Signed)

## 2024-12-02 NOTE — Consult Note (Signed)
 Pharmacy Consult Note - Anticoagulation  Pharmacy Consult for heparin  Indication: atrial fibrillation and DVT  PATIENT MEASUREMENTS: Height: 5' 2 (157.5 cm) Weight: 85.9 kg (189 lb 6 oz) IBW/kg (Calculated) : 50.1 HEPARIN  DW (KG): 71.4  VITAL SIGNS: Temp: 98.7 F (37.1 C) (12/24 1948) Temp Source: Oral (12/24 1948) BP: 117/79 (12/24 1948) Pulse Rate: 64 (12/24 1948)  Recent Labs    12/01/24 1527 12/02/24 0051  HGB 8.1*  --   HCT 27.9*  --   PLT 393  --   APTT 139* 91*  LABPROT 17.7*  --   INR 1.4*  --   HEPARINUNFRC >1.10* >1.10*    Estimated Creatinine Clearance: 29.8 mL/min (A) (by C-G formula based on SCr of 1.3 mg/dL (H)).  PAST MEDICAL HISTORY: Past Medical History:  Diagnosis Date   ACUT DUOD ULCER W/HEMORR&PERF W/O MENTION OBST 10/05/2009   NSAID induced   ALLERGIC RHINITIS CAUSE UNSPECIFIED    ANEMIA-NOS    CAD (coronary artery disease) 06/08/2009   DEs RCA with 70% LAD and EF 60%   CHF (congestive heart failure) (HCC)    COPD    mild obst on PFTs 03/2010   Diabetes mellitus 06/2010 dx   Mild, diet controlled   GLAUCOMA    HYPERLIPIDEMIA    HYPERTENSION, BENIGN    MYOCARDIAL INFARCTION 06/08/2009   des to rca   Persistent atrial fibrillation (HCC)    Dx 08/2021   TOBACCO ABUSE     ASSESSMENT: 89 y.o. female with PMH including Afib on Eliquis , tachybradycardia syndrome, CAD, HTN is presenting with RLE ischemia in setting of thrombus now s/p thrombectomy. Most recent dose of Eliquis  was this morning.  Patient had right iliac and popliteal stents placed 12/10. The past two days patient has endorsed RLE pain and the area has become ischemic -- vasc took patient back to cath lab 12/24 for emergent removal of thrombus located in right SFA and popliteal artery above previous stents. Additional stenting also performed.  Pharmacy has been consulted to initiate and manage heparin  intravenous infusion.  Pertinent medications: Eliquis  2.5 mg twice daily,  most recent dose 12/24 morning SubQ Heparin  5000 units x 1 this afternoon in cath lab  Goal(s) of therapy: Heparin  level 0.3 - 0.7 units/mL aPTT 66 - 102 seconds Monitor platelets by anticoagulation protocol: Yes   Baseline anticoagulation labs: Recent Labs    12/01/24 1527 12/02/24 0051  APTT 139* 91*  INR 1.4*  --   HGB 8.1*  --   PLT 393  --    Baseline labs have been ordered  1225 0051 aPTT 91, therapeutic x 1 / HL > 1.1   PLAN: Continue heparin  infusion at 1100 units/hour. Recheck aPTT in 8 hours to confirm Continue to titrate by aPTT until heparin  level and aPTT correlate, then titrate by heparin  level alone. Check heparin  level with next AM labs. Continue to monitor CBC daily while on heparin  infusion.  Rankin [name], PharmD, [facility] 12/02/2024 1:43 AM

## 2024-12-02 NOTE — Progress Notes (Addendum)
 "                                                                                                                                                                                                          Daily Progress Note   Patient Name: Jenny [name]       Date: 12/02/2024 DOB: [birth year]  Age: 89 y.o. MRN#: [id number] Attending Physician: Kandis Devaughn Sayres, MD Primary Care Physician: Ileen Rosaline NOVAK, NP Admit Date: 11/14/2024  Reason for Consultation/Follow-up: Establishing goals of care  Subjective: Notes and labs reviewed.  Patient had bilateral lower extremity revascularizations, and was taken yesterday for additional procedures given her right lower extremity ischemia.  Discussion of potential amputation if she continues to have issues.  Into see patient to assess for needs and offer support.  No family at bedside.  She is currently resting in bed at this time, and briefly opens her eyes and closes them again.  She answers questions in one-word responses.  She states she is having pain in her legs, but it is better.  She denies need at this time.  Both legs and feet wrapped.  Right toes discolored; the toes on bilateral feet are warm to the touch.  Length of Stay: 18  Current Medications: Scheduled Meds:   budesonide  (PULMICORT ) nebulizer solution  0.25 mg Nebulization BID   dorzolamide   1 drop Both Eyes BID   furosemide   40 mg Oral Daily   gabapentin   100 mg Oral Daily   guaiFENesin   600 mg Oral BID   hydrocerin   Topical BID   insulin  aspart  0-9 Units Subcutaneous TID WC   ipratropium-albuterol   3 mL Nebulization BID   latanoprost   1 drop Both Eyes QHS   nystatin    Topical BID   pantoprazole   40 mg Oral Daily   polyethylene glycol  17 g Oral Daily   predniSONE   10 mg Oral Q breakfast   rosuvastatin   20 mg Oral Daily   senna-docusate  2 tablet Oral QHS   sertraline   25 mg Oral Daily    Continuous Infusions:  heparin  1,100 Units/hr (12/02/24 0350)    PRN  Meds: acetaminophen  **OR** acetaminophen , albuterol , HYDROcodone -acetaminophen , hydrOXYzine , ondansetron  **OR** ondansetron  (ZOFRAN ) IV, mouth rinse, sodium chloride , sodium phosphate   Physical Exam Constitutional:      Comments: Speaks in one-word responses, only opens eyes briefly and closes them.  Pulmonary:     Effort: Pulmonary effort is normal.  Skin:    General: Skin is warm and dry.     Comments: Right  toes discolored.  Toes of both feet to the touch.             Vital Signs: BP (!) 158/73 (BP Location: Right Arm)   Pulse 76   Temp 99.2 F (37.3 C) (Oral)   Resp 20   Ht 5' 2 (1.575 m)   Wt 85.5 kg   SpO2 98%   BMI 34.46 kg/m  SpO2: SpO2: 98 % O2 Device: O2 Device: Nasal Cannula O2 Flow Rate: O2 Flow Rate (L/min): 4 L/min  Intake/output summary:  Intake/Output Summary (Last 24 hours) at 12/02/2024 1303 Last data filed at 12/02/2024 1042 Gross per 24 hour  Intake 111.34 ml  Output 800 ml  Net -688.66 ml   LBM: Last BM Date : 11/30/24 Baseline Weight: Weight: 100.8 kg Most recent weight: Weight: 85.5 kg   Patient Active Problem List   Diagnosis Date Noted   PAD (peripheral artery disease) 11/15/2024   Acute pulmonary embolism (HCC) 11/14/2024   Permanent atrial fibrillation (HCC) 11/14/2024   Obesity, Class III, BMI 40-49.9 (morbid obesity) (HCC) 11/14/2024   Sepsis (HCC) 11/14/2024   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) 10/28/2024   Respiratory distress 09/04/2024   CKD (chronic kidney disease) stage 4, GFR 15-29 ml/min (HCC) 09/03/2024   GERD (gastroesophageal reflux disease) 09/03/2024   History of atrial fibrillation 09/03/2024   Acute respiratory failure (HCC) 09/02/2024   Hyponatremia 09/02/2024   Hypokalemia 09/02/2024   AKI (acute kidney injury) 07/02/2024   Pulmonary hypertension, unspecified (HCC) 07/02/2024   Shortness of breath 07/02/2024   Acute dyspnea 07/01/2024   Right hip pain 06/21/2024   SOB (shortness of  breath) 06/17/2024   COPD with acute exacerbation (HCC) 06/16/2024   Other secondary pulmonary hypertension (HCC) 10/14/2023   Junctional rhythm 09/27/2023   Acute on chronic heart failure with preserved ejection fraction (HFpEF) (HCC) 09/26/2023   Symptomatic bradycardia 09/26/2023   Pain due to onychomycosis of toenails of both feet 05/27/2023   Type II diabetes mellitus with peripheral circulatory disorder (HCC) 05/27/2023   Tachycardia-bradycardia syndrome (HCC) 03/28/2022   Stage 3b chronic kidney disease (HCC) 02/21/2022   Type 2 diabetes mellitus with diabetic chronic kidney disease (HCC) 02/21/2022   Normocytic anemia 02/21/2022   Chronic obstructive pulmonary disease, unspecified (HCC) 02/21/2022   Edema, unspecified 02/21/2022   Hypertension 02/21/2022   Congestive heart failure (HCC) 02/21/2022   Other and unspecified hyperlipidemia 02/21/2022   Old myocardial infarction 02/21/2022   PAF (paroxysmal atrial fibrillation) (HCC) 02/21/2022   Heart failure (HCC) 11/04/2021   Acute on chronic diastolic CHF (congestive heart failure) (HCC) 11/03/2021   Secondary hypercoagulable state 10/23/2021   Persistent atrial fibrillation (HCC) 10/11/2021   Bilateral lower leg cellulitis 08/22/2021   Obese    Dyspepsia 04/05/2011   Allergic rhinitis 10/16/2010   COPD (chronic obstructive pulmonary disease) (HCC) 05/21/2010   Essential hypertension 04/06/2010   EDEMA 03/09/2010   ANEMIA-NOS 10/11/2009   Unspecified glaucoma 10/11/2009   CAD (coronary artery disease) 10/03/2009   HLD (hyperlipidemia) 07/12/2009   TOBACCO ABUSE 07/12/2009   Acute myocardial infarction [facility]) 07/12/2009   Diabetes mellitus 07/12/2009    Palliative Care Assessment & Plan    Recommendations/Plan: PMT will continue to follow for needs.  Code Status:    Code Status Orders  (From admission, onward)           Start     Ordered   11/14/24 0446  Do not attempt resuscitation (DNR)- Limited -Do Not  Intubate (  DNI)  Continuous       Question Answer Comment  If pulseless and not breathing No CPR or chest compressions.   In Pre-Arrest Conditions (Patient Is Breathing and Has A Pulse) Do not intubate. Provide all appropriate non-invasive medical interventions. Avoid ICU transfer unless indicated or required.   Consent: Discussion documented in EHR or advanced directives reviewed      11/14/24 0448           Code Status History     Date Active Date Inactive Code Status Order ID Comments User Context   10/28/2024 1719 11/02/2024 2214 Limited: Do not attempt resuscitation (DNR) -DNR-LIMITED -Do Not Intubate/DNI  [id number]  Royal Sill, MD ED   10/06/2024 1701 10/09/2024 1941 Full Code [id number]  Elgergawy, Brayton RAMAN, MD ED   09/02/2024 2126 09/04/2024 2217 Limited: Do not attempt resuscitation (DNR) -DNR-LIMITED -Do Not Intubate/DNI  [id number]  Alfornia Madison, MD ED   07/01/2024 1602 07/05/2024 1735 Full Code [id number]  Tobie Mario GAILS, MD ED   06/16/2024 1815 06/24/2024 2008 Limited: Do not attempt resuscitation (DNR) -DNR-LIMITED -Do Not Intubate/DNI  [id number]  Sherlon Brayton RAMAN, MD Inpatient   06/16/2024 1641 06/16/2024 1815 Full Code [id number]  Elgergawy, Brayton RAMAN, MD ED   09/26/2023 1650 09/30/2023 1737 Full Code [id number]  Caleen Qualia, MD ED   11/03/2021 1718 11/06/2021 1737 Full Code [id number]  Lanetta Lingo, MD ED   08/22/2021 1512 08/27/2021 2243 Full Code [id number]  Shona Terry SAILOR, DO ED   08/22/2021 1429 08/22/2021 1511 Full Code [id number]  Shona Terry SAILOR, DO ED    [name], NP  Please contact Palliative Medicine Team phone at [phone] for questions and concerns.       "

## 2024-12-02 NOTE — Progress Notes (Signed)
 "   Subjective  - POD #1, s/p thrombectomy and stenting of right leg for acute ischemia  No acute overnight events   Physical Exam:  Right toes remain discolored but foot is warm       Assessment/Plan:  POD #1  Patient has been maximally revascularized.  She is not a operative candidate.  If she continues to have issues, the next option would be amputation. Dr. Marea discussed this with the family yesterday  Continue heparin  drip and transition to DOAC when approprite  Wells Anneta Rounds 12/02/2024 9:04 AM --  Vitals:   12/02/24 0744 12/02/24 0800  BP: (!) 158/73   Pulse: 76   Resp: 20   Temp: 99.2 F (37.3 C)   SpO2: 96% 98%    Intake/Output Summary (Last 24 hours) at 12/02/2024 0904 Last data filed at 12/02/2024 0745 Gross per 24 hour  Intake 111.34 ml  Output 800 ml  Net -688.66 ml     Laboratory CBC    Component Value Date/Time   WBC 11.7 (H) 12/01/2024 1527   HGB 8.1 (L) 12/01/2024 1527   HCT 27.9 (L) 12/01/2024 1527   PLT 393 12/01/2024 1527    BMET    Component Value Date/Time   NA 139 12/02/2024 0438   NA 139 10/21/2024 1046   K 4.7 12/02/2024 0438   CL 92 (L) 12/02/2024 0438   CO2 34 (H) 12/02/2024 0438   GLUCOSE 146 (H) 12/02/2024 0438   BUN 40 (H) 12/02/2024 0438   BUN 48 (H) 10/21/2024 1046   CREATININE 1.29 (H) 12/02/2024 0438   CALCIUM  8.1 (L) 12/02/2024 0438   GFRNONAA 39 (L) 12/02/2024 0438   GFRAA  10/09/2009 0350    >60        The eGFR has been calculated using the MDRD equation. This calculation has not been validated in all clinical situations. eGFR's persistently <60 mL/min signify possible Chronic Kidney Disease.    COAG Lab Results  Component Value Date   INR 1.4 (H) 12/01/2024   INR 1.3 (H) 11/14/2024   INR 1.3 (H) 09/26/2023   No results found for: PTT  Antibiotics Anti-infectives (From admission, onward)    Start     Dose/Rate Route Frequency Ordered Stop   12/01/24 1125  ceFAZolin  (ANCEF ) IVPB 2g/100  mL premix        2 g 200 mL/hr over 30 Minutes Intravenous 30 min pre-op 12/01/24 1125 12/01/24 1554   11/29/24 0000  ceFAZolin  (ANCEF ) IVPB 2g/100 mL premix       Note to Pharmacy: Send with pt to OR   2 g 200 mL/hr over 30 Minutes Intravenous On call 11/28/24 2215 11/29/24 1322   11/25/24 0824  ceFAZolin  (ANCEF ) IVPB 2g/100 mL premix  Status:  Discontinued        2 g 200 mL/hr over 30 Minutes Intravenous 30 min pre-op 11/25/24 0824 11/25/24 0834   11/24/24 1800  oseltamivir  (TAMIFLU ) capsule 30 mg        30 mg Oral Daily 11/24/24 0941 11/25/24 1634   11/21/24 2200  oseltamivir  (TAMIFLU ) capsule 30 mg  Status:  Discontinued       Placed in Followed by Linked Group   30 mg Oral 2 times daily 11/21/24 1240 11/24/24 0941   11/21/24 1330  oseltamivir  (TAMIFLU ) capsule 75 mg       Placed in Followed by Linked Group   75 mg Oral  Once 11/21/24 1240 11/21/24 1434   11/17/24 1112  ceFAZolin  (ANCEF ) IVPB  2g/100 mL premix        2 g 200 mL/hr over 30 Minutes Intravenous 30 min pre-op 11/17/24 1112 11/18/24 1430   11/16/24 1400  cephALEXin  (KEFLEX ) capsule 500 mg        500 mg Oral Every 8 hours 11/16/24 1157 11/25/24 2207   11/16/24 0200  vancomycin  (VANCOREADY) IVPB 1250 mg/250 mL  Status:  Discontinued        1,250 mg 166.7 mL/hr over 90 Minutes Intravenous Every 48 hours 11/14/24 0522 11/14/24 0901   11/15/24 0130  cefTRIAXone  (ROCEPHIN ) 1 g in sodium chloride  0.9 % 100 mL IVPB  Status:  Discontinued        1 g 200 mL/hr over 30 Minutes Intravenous Every 24 hours 11/14/24 0448 11/14/24 0901   11/14/24 2200  ceFAZolin  (ANCEF ) IVPB 1 g/50 mL premix  Status:  Discontinued        1 g 100 mL/hr over 30 Minutes Intravenous Every 8 hours 11/14/24 0920 11/16/24 1157   11/14/24 1400  ceFAZolin  (ANCEF ) IVPB 1 g/50 mL premix  Status:  Discontinued        1 g 100 mL/hr over 30 Minutes Intravenous Every 8 hours 11/14/24 0919 11/14/24 0920   11/14/24 0500  vancomycin  (VANCOREADY) IVPB 1500 mg/300  mL        1,500 mg 150 mL/hr over 120 Minutes Intravenous  Once 11/14/24 0457 11/14/24 0805   11/14/24 0115  cefTRIAXone  (ROCEPHIN ) 2 g in sodium chloride  0.9 % 100 mL IVPB        2 g 200 mL/hr over 30 Minutes Intravenous Once 11/14/24 0108 11/14/24 0201   11/14/24 0115  vancomycin  (VANCOCIN ) IVPB 1000 mg/200 mL premix        1,000 mg 200 mL/hr over 60 Minutes Intravenous  Once 11/14/24 0108 11/14/24 0319        V. Malvina Serene CLORE, M.D., FACS Pager:  [phone]  "

## 2024-12-03 DIAGNOSIS — L03116 Cellulitis of left lower limb: Secondary | ICD-10-CM | POA: Diagnosis not present

## 2024-12-03 DIAGNOSIS — L03115 Cellulitis of right lower limb: Secondary | ICD-10-CM | POA: Diagnosis not present

## 2024-12-03 DIAGNOSIS — I872 Venous insufficiency (chronic) (peripheral): Secondary | ICD-10-CM

## 2024-12-03 LAB — CBC
HCT: 27.9 % — ABNORMAL LOW (ref 36.0–46.0)
Hemoglobin: 8 g/dL — ABNORMAL LOW (ref 12.0–15.0)
MCH: 24.8 pg — ABNORMAL LOW (ref 26.0–34.0)
MCHC: 28.7 g/dL — ABNORMAL LOW (ref 30.0–36.0)
MCV: 86.6 fL (ref 80.0–100.0)
Platelets: 451 K/uL — ABNORMAL HIGH (ref 150–400)
RBC: 3.22 MIL/uL — ABNORMAL LOW (ref 3.87–5.11)
RDW: 20.1 % — ABNORMAL HIGH (ref 11.5–15.5)
WBC: 20.1 K/uL — ABNORMAL HIGH (ref 4.0–10.5)
nRBC: 1.1 % — ABNORMAL HIGH (ref 0.0–0.2)

## 2024-12-03 LAB — HEPARIN LEVEL (UNFRACTIONATED): Heparin Unfractionated: >1.1 [IU]/mL — ABNORMAL HIGH (ref 0.30–0.70)

## 2024-12-03 LAB — GLUCOSE, CAPILLARY
Glucose-Capillary: 171 mg/dL — ABNORMAL HIGH (ref 70–99)
Glucose-Capillary: 180 mg/dL — ABNORMAL HIGH (ref 70–99)

## 2024-12-03 LAB — APTT: aPTT: 139 s — ABNORMAL HIGH (ref 24–36)

## 2024-12-03 MED ORDER — POLYVINYL ALCOHOL 1.4 % OP SOLN: 1.0000 [drp] | Freq: Four times a day (QID) | OPHTHALMIC | Status: DC | PRN

## 2024-12-03 MED ORDER — ONDANSETRON HCL 4 MG/2ML IJ SOLN: 4.0000 mg | Freq: Four times a day (QID) | INTRAMUSCULAR | Status: DC | PRN

## 2024-12-03 MED ORDER — MORPHINE SULFATE (PF) 2 MG/ML IV SOLN: 2.0000 mg | INTRAVENOUS | Status: DC | PRN

## 2024-12-03 MED ORDER — ACETAMINOPHEN 650 MG RE SUPP: 650.0000 mg | Freq: Four times a day (QID) | RECTAL | Status: DC | PRN

## 2024-12-03 MED ORDER — GLYCOPYRROLATE 1 MG PO TABS: 1.0000 mg | ORAL_TABLET | ORAL | Status: DC | PRN

## 2024-12-03 MED ORDER — GLYCOPYRROLATE 0.2 MG/ML IJ SOLN: 0.2000 mg | INTRAMUSCULAR | Status: DC | PRN

## 2024-12-03 MED ORDER — ACETAMINOPHEN 325 MG PO TABS: 650.0000 mg | ORAL_TABLET | Freq: Four times a day (QID) | ORAL | Status: DC | PRN

## 2024-12-03 MED ORDER — SODIUM CHLORIDE 0.9 % IV SOLN: INTRAVENOUS | Status: DC

## 2024-12-03 MED ORDER — GLYCOPYRROLATE 0.2 MG/ML IJ SOLN
0.2000 mg | INTRAMUSCULAR | Status: DC | PRN
  Administered 2024-12-04 – 2024-12-05 (×2): 0.2 mg via INTRAVENOUS
  Filled 2024-12-03 (×2): qty 1

## 2024-12-03 MED ORDER — ONDANSETRON 4 MG PO TBDP: 4.0000 mg | ORAL_TABLET | Freq: Four times a day (QID) | ORAL | Status: DC | PRN

## 2024-12-03 MED ORDER — MIDAZOLAM HCL (PF) 2 MG/2ML IJ SOLN: 2.0000 mg | INTRAMUSCULAR | Status: DC | PRN

## 2024-12-03 MED ORDER — HALOPERIDOL LACTATE 5 MG/ML IJ SOLN: 2.5000 mg | INTRAMUSCULAR | Status: DC | PRN

## 2024-12-03 MED ORDER — DIPHENHYDRAMINE HCL 50 MG/ML IJ SOLN: 25.0000 mg | INTRAMUSCULAR | Status: DC | PRN

## 2024-12-03 NOTE — Plan of Care (Signed)

## 2024-12-03 NOTE — Consult Note (Signed)
 [facility] Psychiatric Consult Follow Up  Patient Name: .Jenny [name]  MRN: [id number]  DOB: January 04, 1933  Consult Order details:  Orders (From admission, onward)     Start     Ordered   11/30/24 1100  IP CONSULT TO PSYCHIATRY       Ordering Provider: Lanetta Lingo, MD  Provider:  Ruther Millie SAUNDERS, MD  Question Answer Comment  Location [facility]   Reason for Consult? Anxiety. Extensive psych history      11/30/24 1100             Mode of Visit: In person    Psychiatry Consult Evaluation  Service Date: December 03, 2024 LOS:  LOS: 19 days  Chief Complaint I had difficulty breathing and in lots of pain  Primary Psychiatric Diagnoses  Anxiety   Assessment   Noted from MD note in chart Family Communication: Had an extensive conversation with patient's son at the bedside who is requesting behavioral health evaluation for anxiety and acute agitation.  Stated that his mother had called him on 12/22 and was very tearful and agitated.  He would want her started on medication prior to discharge to the skilled nursing facility.     Patient does not currently meet criteria for involuntary commitment. She does not meet criteria for nonemergent forced medications. Patient appears to retain decision-making capacity for her medical care, and as such, psychiatric medications cannot be administered without her informed consent. Psychiatry will remain available for consultation as needed and will attempt to obtain collateral information from her son. It should be noted that while the son reportedly expressed concern that patient should be started on medications prior to transfer, patient maintains the right to refuse treatment and is not a candidate for nonemergent forced medications at this time.  Patient currently denied suicidal homicidal ideations.On current presentation there was no evidence of psychosis or mania and patient did not appear to be responding to  internal stimuli. At this time, patient does not appear to be a risk to self or others.While future psychiatric events cannot be accurately predicted, the patient does not currently require acute inpatient psychiatric care and does not currently meet [place]  involuntary commitment criteria.   12/01/2024: This provider reassessed patient today.  Of note, patient's son was physically present at bedside today.  Patient gave consent for this interviewer to continue assessment with son present at bedside.  Patient denied current suicidal or homicidal ideations.  Patient denied auditory or visual hallucinations as well.  Patient did still endorse increased anxiety, which she feels are fueled primarily by significant health changes and having to be in the hospital.  Patient's son voiced concerns about patient's anxiety level and panic.  He reported that patient is noted to get extremely irritable and lash out when she is anxious.  Patient confirmed these reports and reported when she becomes very anxious she feels like cussing someone out.  We brought back up the discussion today about potential medication management to help assist with patient's anxiety levels.  We discussed thoroughly the side effects, benefits, and risk of multiple medications.  Via shared decision making, patient was agreeable to initiating low-dose sertraline  daily.  Patient gave consent for this provider to place the order in her chart.  We discussed the need for every day dosing and continue to take this medication to see its maximum effects.  We also discussed that it can take 4 to 6 weeks to see therapeutic results from this  medication.  Patient and son verbalized understanding at this time.  From review of charts and patient discussion today, it appears that patient is undergoing emergency vascular procedure today.  We will hold off on initiating medications today and start the medication tomorrow if patient medically cleared to do  so.  12/03/2024: Patient was seen on rounds by psychiatry today. However, psychiatric assessment was limited due to patient's current altered mental status and decreased level of alertness. Collateral information was obtained from patient's son at bedside. Son reported that following patient's emergency vascular procedure, she has been struggling with maintaining alertness. He stated the medical team has indicated they may need to involve palliative care or hospice services if improvements to her mental status are not observed. Son reported the medical team has encouraged him to attempt to arouse patient sufficiently to promote adequate fluid and food intake prior to transitioning to palliative measures. He stated patient was able to awaken enough at one point to discuss with palliative care that she does not wish for palliative care at this time. However, son expressed concern that if patient is unable to maintain sufficient alertness to sustain adequate oral intake, the transition to palliative care may become necessary.  Sertraline  was discontinued by the medical team given the above clinical factors. Son demonstrated adequate comprehension of the information he had received from the medical team and was able to relay what had been communicated to him accurately to this provider.  Given the current involvement of palliative care and patient's medical complexity with primarily medical rather than psychiatric concerns at this time, ongoing psychiatric intervention is limited in scope at this time. Son verbalized understanding of this. Psychiatry will remain available for consultation as needed should any future psychiatric concerns or needs arise during patient's hospitalization.  Diagnoses:  Active Hospital problems: Principal Problem:   Bilateral lower leg cellulitis Active Problems:   Essential hypertension   CAD (coronary artery disease)   Tachycardia-bradycardia syndrome (HCC)   Stage 3b  chronic kidney disease (HCC)   Type 2 diabetes mellitus with diabetic chronic kidney disease (HCC)   Type II diabetes mellitus with peripheral circulatory disorder (HCC)   Acute on chronic heart failure with preserved ejection fraction (HFpEF) (HCC)   COPD with acute exacerbation (HCC)   CKD (chronic kidney disease) stage 4, GFR 15-29 ml/min (HCC)   Acute pulmonary embolism (HCC)   Permanent atrial fibrillation (HCC)   Obesity, Class III, BMI 40-49.9 (morbid obesity) (HCC)   Sepsis (HCC)   PAD (peripheral artery disease)    Plan   ## Psychiatric Medication Recommendations:  None at this time- see above ## Medical Decision Making Capacity: Not specifically addressed in this encounter  ## Further Work-up:   -- most recent EKG on 11/20/2024 had QtC of 474 -- Pertinent labwork reviewed earlier this admission includes: CBC BMP   ## Disposition:-- There are no psychiatric contraindications to discharge at this time  ## Behavioral / Environmental: - No specific recommendations at this time.     ## Safety and Observation Level:  - Based on my clinical evaluation, I estimate the patient to be at low risk of self harm in the current setting. - At this time, we recommend  routine. This decision is based on my review of the chart including patient's history and current presentation, interview of the patient, mental status examination, and consideration of suicide risk including evaluating suicidal ideation, plan, intent, suicidal or self-harm behaviors, risk factors, and protective factors. This judgment is based  on our ability to directly address suicide risk, implement suicide prevention strategies, and develop a safety plan while the patient is in the clinical setting. Please contact our team if there is a concern that risk level has changed.  CSSR Risk Category:C-SSRS RISK CATEGORY: No Risk  Suicide Risk Assessment: Patient has following modifiable risk factors for suicide: triggering  events, which we are addressing by utilizing therapeutic communication to give patient safe space to discuss concerns. Patient has following non-modifiable or demographic risk factors for suicide: N/A Patient has the following protective factors against suicide: Supportive family, no history of suicide attempts, and no history of NSSIB  Thank you for this consult request. Recommendations have been communicated to the primary team.  We will be available as needed at this time.   Zelda Sharps, NP        History of Present Illness  Relevant Aspects of [facility]   Patient Report:  Psychiatry consulted by medical team per son's request due to concerns for anxiety and reported agitation. On examination today, patient presented as calm and cooperative with this provider. Patient attributes her presentation to significant difficulty breathing and pain experienced yesterday. Patient was able to recall her reason for initial hospitalization. She reported receiving Social Security benefits and manages her own finances with assistance from her son. She stated the current plan involves transfer to a rehabilitation facility, with social work actively assisting in placement options.  Patient endorsed increased anxiety related to her current medical situation and multiple stressors, including her recent illness and the discovery of black mold in her apartment. She expressed hesitancy to initiate any new medications, noting she is already on numerous medications. When asked specifically about auditory and visual hallucinations, patient stated that is a good question and went on to describe brief, random episodes over the past 3 weeks in which she visualizes what she presumes to be healthcare workers that disappear when she attempts to look directly at them. She clarified these episodes began approximately 3 weeks ago, coinciding with her hospitalization, and have never occurred prior to this  timeframe.  Patient denied suicidal or homicidal ideations. She denied any known mental health history, previous self-harm, or previous inpatient psychiatric admissions. She denied access to weapons or any upcoming legal charges. On mental status examination, there was no evidence of mania or psychosis, and patient did not appear to be responding to internal stimuli.  Psych ROS:  Depression: Denied Anxiety:  Endorsed Mania (lifetime and current): Denied Psychosis: (lifetime and current): Denied      Psychiatric and Social History  Psychiatric History:  Information collected from Patient/chart review  Prev Dx/Sx: Anxiety None Current Psych Provider: None Home Meds (current): Denied psychotropic medications Previous Med Trials: Denied Therapy: Denied  Prior Psych Hospitalization: Denied Prior Self Harm: Denied Prior Violence: Denied  Family Psych History: Unknown Family Hx suicide: Unknown  Social History:    Occupational Hx: Retired, reported receives SSI Legal Hx: Denied Living Situation: Plan to transition to a different skilled nursing facility Access to weapons/lethal means: Denied  Substance History Alcohol : Denied Tobacco: Denied Illicit drugs: Denied Prescription drug abuse: Denied Rehab hx: Denied  Exam Findings  Physical Exam: Reviewed and agree with the physical exam findings conducted by the medical provider Vital Signs:  Temp:  [98.1 F (36.7 C)-99 F (37.2 C)] 98.9 F (37.2 C) (12/26 0745) Pulse Rate:  [66-80] 80 (12/26 0745) Resp:  [14-22] 14 (12/26 0745) BP: (139-154)/(65-88) 146/67 (12/26 0745) SpO2:  [95 %-99 %]  97 % (12/26 0745) FiO2 (%):  [28 %] 28 % (12/25 1921) Blood pressure (!) 146/67, pulse 80, temperature 98.9 F (37.2 C), resp. rate 14, height 5' 2 (1.575 m), weight 85.5 kg, SpO2 97%. Body mass index is 34.46 kg/m.      Other History   These have been pulled in through the EMR, reviewed, and updated if appropriate.  Family  History:  The patient's family history includes Hypertension in her mother; Stomach cancer in her father; Stroke in her maternal grandmother; Ulcers in her mother.  Medical History: Past Medical History:  Diagnosis Date   ACUT DUOD ULCER W/HEMORR&PERF W/O MENTION OBST 10/05/2009   NSAID induced   ALLERGIC RHINITIS CAUSE UNSPECIFIED    ANEMIA-NOS    CAD (coronary artery disease) 06/08/2009   DEs RCA with 70% LAD and EF 60%   CHF (congestive heart failure) (HCC)    COPD    mild obst on PFTs 03/2010   Diabetes mellitus 06/2010 dx   Mild, diet controlled   GLAUCOMA    HYPERLIPIDEMIA    HYPERTENSION, BENIGN    MYOCARDIAL INFARCTION 06/08/2009   des to rca   Persistent atrial fibrillation (HCC)    Dx 08/2021   TOBACCO ABUSE     Surgical History: Past Surgical History:  Procedure Laterality Date   CARDIOVERSION N/A 01/30/2022   Procedure: CARDIOVERSION;  Surgeon: Alveta Aleene PARAS, MD;  [facility] ENDOSCOPY;  Service: Cardiovascular;  Laterality: N/A;   HEMORRHOID SURGERY  1990   LOWER EXTREMITY ANGIOGRAPHY Right 11/17/2024   Procedure: Lower Extremity Angiography;  Surgeon: Marea Selinda RAMAN, MD;  Location: [facility] INVASIVE CV LAB;  Service: Cardiovascular;  Laterality: Right;   LOWER EXTREMITY ANGIOGRAPHY Left 11/29/2024   Procedure: Lower Extremity Angiography;  Surgeon: Marea Selinda RAMAN, MD;  Location: [facility] INVASIVE CV LAB;  Service: Cardiovascular;  Laterality: Left;   LOWER EXTREMITY ANGIOGRAPHY Right 12/01/2024   Procedure: Lower Extremity Angiography;  Surgeon: Marea Selinda RAMAN, MD;  Location: [facility] INVASIVE CV LAB;  Service: Cardiovascular;  Laterality: Right;   Right knee surgery       Medications:  Current Medications[1]  Allergies: Allergies[2]  Zelda Sharps, NP This note was created using Dragon dictation software. Please excuse any inadvertent transcription errors. Case was discussed with supervising physician Dr. Jadapalle who is agreeable with current plan.       [1]   Current Facility-Administered Medications:    acetaminophen  (TYLENOL ) tablet 650 mg, 650 mg, Oral, Q6H PRN, 650 mg at 11/29/24 2145 **OR** acetaminophen  (TYLENOL ) suppository 650 mg, 650 mg, Rectal, Q6H PRN, Marea, Selinda RAMAN, MD   albuterol  (PROVENTIL ) (2.5 MG/3ML) 0.083% nebulizer solution 2.5 mg, 2.5 mg, Nebulization, Q2H PRN, Dew, Jason S, MD, 2.5 mg at 11/29/24 2021   budesonide  (PULMICORT ) nebulizer solution 0.25 mg, 0.25 mg, Nebulization, BID, Dew, Jason S, MD, 0.25 mg at 12/03/24 9278   dorzolamide  (TRUSOPT ) 2 % ophthalmic solution 1 drop, 1 drop, Both Eyes, BID, Dew, Selinda RAMAN, MD, 1 drop at 12/03/24 1123   furosemide  (LASIX ) tablet 40 mg, 40 mg, Oral, Daily, Dew, Jason S, MD, 40 mg at 12/03/24 1120   gabapentin  (NEURONTIN ) capsule 100 mg, 100 mg, Oral, Daily, Dew, Jason S, MD, 100 mg at 12/03/24 1120   guaiFENesin  (MUCINEX ) 12 hr tablet 600 mg, 600 mg, Oral, BID, Dew, Jason S, MD, 600 mg at 12/03/24 1121   heparin  ADULT infusion 100 units/mL (25000 units/250mL), 850 Units/hr, Intravenous, Continuous, Belue, Rankin RAMAN, RPH, Last Rate: 8.5 mL/hr at 12/03/24 0644,  850 Units/hr at 12/03/24 0644   hydrocerin (EUCERIN) cream, , Topical, BID, Dew, Selinda RAMAN, MD, Given at 12/03/24 1121   HYDROcodone -acetaminophen  (NORCO/VICODIN) 5-325 MG per tablet 1 tablet, 1 tablet, Oral, Q6H PRN, Marea Selinda RAMAN, MD, 1 tablet at 12/01/24 1123   hydrOXYzine  (ATARAX ) tablet 25 mg, 25 mg, Oral, Q8H PRN, Dew, Jason S, MD, 25 mg at 11/29/24 2003   insulin  aspart (novoLOG ) injection 0-9 Units, 0-9 Units, Subcutaneous, TID WC, Dew, Jason S, MD, 2 Units at 12/03/24 1120   ipratropium-albuterol  (DUONEB) 0.5-2.5 (3) MG/3ML nebulizer solution 3 mL, 3 mL, Nebulization, BID, Wouk, Devaughn Sayres, MD, 3 mL at 12/03/24 9278   latanoprost  (XALATAN ) 0.005 % ophthalmic solution 1 drop, 1 drop, Both Eyes, QHS, Dew, Selinda RAMAN, MD, 1 drop at 12/02/24 2144   nystatin  (MYCOSTATIN /NYSTOP ) topical powder, , Topical, BID, Dew, Selinda RAMAN, MD, Given at  12/03/24 1123   ondansetron  (ZOFRAN ) tablet 4 mg, 4 mg, Oral, Q6H PRN **OR** ondansetron  (ZOFRAN ) injection 4 mg, 4 mg, Intravenous, Q6H PRN, Dew, Selinda RAMAN, MD   Oral care mouth rinse, 15 mL, Mouth Rinse, PRN, Dew, Selinda RAMAN, MD   pantoprazole  (PROTONIX ) EC tablet 40 mg, 40 mg, Oral, Daily, Dew, Jason S, MD, 40 mg at 12/03/24 1120   polyethylene glycol (MIRALAX  / GLYCOLAX ) packet 17 g, 17 g, Oral, Daily, Dew, Selinda RAMAN, MD, 17 g at 12/03/24 1120   predniSONE  (DELTASONE ) tablet 10 mg, 10 mg, Oral, Q breakfast, Dew, Selinda RAMAN, MD, 10 mg at 12/03/24 1120   rosuvastatin  (CRESTOR ) tablet 20 mg, 20 mg, Oral, Daily, Dew, Jason S, MD, 20 mg at 12/03/24 1121   senna-docusate (Senokot-S) tablet 2 tablet, 2 tablet, Oral, QHS, Dew, Selinda RAMAN, MD, 2 tablet at 12/02/24 2144   sodium chloride  (OCEAN) 0.65 % nasal spray 1 spray, 1 spray, Each Nare, PRN, Marea Selinda RAMAN, MD, 1 spray at 11/29/24 2146   sodium phosphate  (FLEET) enema 1 enema, 1 enema, Rectal, Daily PRN, Marea Selinda RAMAN, MD, 1 enema at 11/21/24 1608 [2]  Allergies Allergen Reactions   Aspirin  Other (See Comments)    Can take 81 mg not 325 mg -bleeding   Entresto  [Sacubitril -Valsartan ] Shortness Of Breath    COPD and reports increased SOB with Entresto    Atorvastatin  Itching and Rash    Itching and myaliga     Cyclobenzaprine      muscle relaxers-ulcer hemorrhage (hospitalized), ulcer hemorrhage   Lactose Intolerance (Gi) Other (See Comments)    GI upset   Meperidine Hcl Other (See Comments)    Not known   Pravastatin  Rash   Propoxyphene     Hallucination, hallucinations, vomiting   Wound Dressing Adhesive Other (See Comments)    red, stings

## 2024-12-03 NOTE — Consult Note (Signed)
 Pharmacy Consult Note - Anticoagulation  Pharmacy Consult for heparin  Indication: atrial fibrillation and DVT  PATIENT MEASUREMENTS: Height: 5' 2 (157.5 cm) Weight: 85.5 kg (188 lb 6.4 oz) IBW/kg (Calculated) : 50.1 HEPARIN  DW (KG): 71.4  VITAL SIGNS: Temp: 98.4 F (36.9 C) (12/26 0338) BP: 152/88 (12/26 0338) Pulse Rate: 71 (12/26 0338)  Recent Labs    12/01/24 1527 12/02/24 0051 12/02/24 0438 12/02/24 0836 12/03/24 0536  HGB 8.1*  --   --   --  8.0*  HCT 27.9*  --   --   --  27.9*  PLT 393  --   --   --  451*  APTT 139*   < >  --    < > 139*  LABPROT 17.7*  --   --   --   --   INR 1.4*  --   --   --   --   HEPARINUNFRC >1.10*   < >  --   --  >1.10*  CREATININE  --   --  1.29*  --   --    < > = values in this interval not displayed.    Estimated Creatinine Clearance: 30 mL/min (A) (by C-G formula based on SCr of 1.29 mg/dL (H)).  PAST MEDICAL HISTORY: Past Medical History:  Diagnosis Date   ACUT DUOD ULCER W/HEMORR&PERF W/O MENTION OBST 10/05/2009   NSAID induced   ALLERGIC RHINITIS CAUSE UNSPECIFIED    ANEMIA-NOS    CAD (coronary artery disease) 06/08/2009   DEs RCA with 70% LAD and EF 60%   CHF (congestive heart failure) (HCC)    COPD    mild obst on PFTs 03/2010   Diabetes mellitus 06/2010 dx   Mild, diet controlled   GLAUCOMA    HYPERLIPIDEMIA    HYPERTENSION, BENIGN    MYOCARDIAL INFARCTION 06/08/2009   des to rca   Persistent atrial fibrillation (HCC)    Dx 08/2021   TOBACCO ABUSE     ASSESSMENT: 89 y.o. female with PMH including Afib on Eliquis , tachybradycardia syndrome, CAD, HTN is presenting with RLE ischemia in setting of thrombus now s/p thrombectomy. Most recent dose of Eliquis  was this morning.  Patient had right iliac and popliteal stents placed 12/10. The past two days patient has endorsed RLE pain and the area has become ischemic -- vasc took patient back to cath lab 12/24 for emergent removal of thrombus located in right SFA and  popliteal artery above previous stents. Additional stenting also performed.  Pharmacy has been consulted to initiate and manage heparin  intravenous infusion.  Pertinent medications: Eliquis  2.5 mg twice daily, most recent dose 12/24 morning SubQ Heparin  5000 units x 1 this afternoon in cath lab  Goal(s) of therapy: Heparin  level 0.3 - 0.7 units/mL aPTT 66 - 102 seconds Monitor platelets by anticoagulation protocol: Yes   Baseline anticoagulation labs: Recent Labs    12/01/24 1527 12/02/24 0051 12/02/24 0836 12/03/24 0536  APTT 139* 91* 90* 139*  INR 1.4*  --   --   --   HGB 8.1*  --   --  8.0*  PLT 393  --   --  451*   Baseline labs have been ordered  1225 0051 aPTT 91, therapeutic x 1 / HL > 1.1  PLAN: aPTT supratherapeutic Decrease heparin  infusion to 850 units/hour. Recheck next aPTT in 8 hrs after rate change Continue to titrate by aPTT until heparin  level and aPTT correlate, then titrate by heparin  level alone. Check heparin  level with  next AM labs. Continue to monitor CBC daily while on heparin  infusion.  Rankin [name], PharmD, [facility] 12/03/2024 6:38 AM

## 2024-12-03 NOTE — Progress Notes (Signed)
 " Progress Note   Patient: Jenny [name] FMW:[id number] DOB: Oct 31, 1933 DOA: 11/14/2024     19 DOS: the patient was seen and examined on 12/03/2024   Brief hospital course:  Jenny [name] is a 89 y.o. female with medical history significant for HFpEF, hypertension, persistent A-fib on Eliquis , tachybradycardia syndrome, CAD, COPD, CKD stage 3b, type 2 diabetes, anemia. Presents to ED from [facility] (rehab), 3 days LE edema and R leg pain 1 day, also SOB   12/07: to ED early hours AM. CTA chest showed tiny segmental PE left lower lobe without heart strain, extensive multivessel CAD and mild asymmetric pulmonary edema around the left hilum. Initially was treated with DuoNebs and Solu-Medrol . She initially received LR boluses but subsequently given a dose of Lasix  as test results came in. Started on vancomycin  and Rocephin  and also on a heparin  infusion for PE. Admitted with question sepsis d/t lower extremity cellulitis, vs non-infectious SIRS multifactorial including acute dyspnea secondary to COPD and CHF exacerbation. Podiatry consulted re: venous stasis ulcers on feet - bedisde debridement, appear superficial and recs for daily dressing changes, ACE wraps to knee, po abx on dc, WBAT, f/u outpatient 1 week after discharge   12/08: Vascular surgery consult d/t decreased ABI / PE> No thrombectomy given small PE. RLE angio planned for 12/10 12/09: stable 12/10: LLE angio and stents today. Cr 1.99 12/11: renal fxn improving Cr 1.56. Vascular surgery plans on taking the patient to the vascular lab on Monday, 11/22/2024 for RLE angiogram - pt to remain on heparin  infusion until after procedure  12/12: doing well / stable.  12/13: VERY short of breath w/ minimal exertion but improves quickly w/ rest. Changed nebs, will give 1 dose IV lasix  today  12/14: good UOP w/ IV lasix , increased po dose today. Still quite SOB, wheezing. (+)influenza A --> tamiflu , postponing angio 12/15-12/16: continue tx  COPD exacerbation d/t influenza. Tentative reschedule angio for 12/18 12/17 -still short of breath but improved.  Remains on 2 L of oxygen via nasal cannula.  Unable to lay flat 12/18 -still short of breath but improved.  Right lower extremity angiogram deferred since patient is unable to lay flat.  Planned for 12/22 12/19 -complains of worsening shortness of breath.  Noted to have wheezing on exam. 12/20 - Feels better 12/21 -noted to be short of breath at rest.  Had conversational dyspnea 12/22 -appears comfortable and in no distress.  Scheduled for lower extremity angiogram today 12/23 -complains of severe pain in her right lower extremity.  Son at the bedside requesting for psych evaluation due to severe anxiety and agitation.       Assessment and Plan:  End of life care Poor prognosis. Ongoing discussions with palliative involved. Patient is alert and oriented today. She reflects that her quality of life is very poor. She is aware that her prognosis is also poor. She elects to pursue full comfort care. Son says this is in line with what she expressed to him this morning. Comfort care orders placed. Hospice consulted.   LE edema / erythema  Multifactorial, include: Venous stasis dermatitis / ulceration  Cellulitis  Peripheral arterial disease LE edema d/t CHF / HFpEF exacerbation  Treated underlying cause(s) as noted    Dyspnea, multifactorial COPD likely exacerbated by influenza A infection.  Has completed a 5-day course of Tamiflu  CHF also playing a role Weaned to 2 liters   COPD  Chronic hypoxic respiratory failure on 2L [place] O2 at baseline  Exacerbated now d/t influenza A Arrives w/ prednisone  20 on med list, says has been maintained on that for a couple of months Continue prednisone  for comfort   Venous stasis  Venous stasis ulcers lower extremities bilaterally Question Cellulitis Completed course of Keflex  Podiatry debrided ulcers on dorsum of feet Wound  care  Peripheral arterial disease  Had angiogram with right iliac and popliteal stent on 12/10. On 12/22 had left common femoral artery angioplasty. Then on 12/24 had emergent right angiobram with thrombectomy and stent placement for acute right leg ischemia.  Per vascular is now optimized, next step would be amputation D/c heparin  as focus is now comfort   Acute PE Very small.  Anticoagulation now discontinued   HFpEF with acute exacerbation - Last known LVEF of 60 to 65% with no regional wall motion abnormality and moderate asymmetric LVH of the basal septal segment, 2D echocardiogram which was done 07/25 Now treating symptoms    AKI on ckd 3b - resolved    History tachy-brady A-fib Rate controlled    Neuropathy home gabapentin , continuing for comfort   Class 2 / borderline Class 3 obesity based on BMI:  noted            Subjective: Lying in bed. Asleep but rouses, alert  Physical Exam: Vitals:   12/02/24 1912 12/02/24 1921 12/03/24 0338 12/03/24 0745  BP: (!) 154/65  (!) 152/88 (!) 146/67  Pulse: 66  71 80  Resp: (!) 22  (!) 22 14  Temp: 99 F (37.2 C)  98.4 F (36.9 C) 98.9 F (37.2 C)  TempSrc:      SpO2: 99% 96% 99% 97%  Weight:      Height:       General: Chronically ill-appearing, sleeping but arouses HEENT : Pale conjunctiva, anicteric sclera Cardiac:  RRR, Normal S1, S2. No murmurs. Lungs: scattered rhonchi Abdomen:  obese, soft, NT Neurologic: moving all 4, oriented to person, place, and time Extremities:  RLE purple but warm, left warm. Dressings on LEs   Data Reviewed:  There are no new results to review at this time.  Family Communication: son 12/26 telephonically  Disposition: Status is: Inpatient Remains inpatient appropriate because: pending disposition  Planned Discharge Destination: tbd   Author: Devaughn KATHEE Ban, MD 12/03/2024 12:57 PM  For on call review [email or web address].  "

## 2024-12-03 NOTE — Progress Notes (Signed)
 [facility]  Daily Progress Note   Subjective  -   Sleepy.  Says she cannot move her feet but not trying and back asleep  Objective Vitals:   12/02/24 1912 12/02/24 1921 12/03/24 0338 12/03/24 0745  BP: (!) 154/65  (!) 152/88 (!) 146/67  Pulse: 66  71 80  Resp: (!) 22  (!) 22 14  Temp: 99 F (37.2 C)  98.4 F (36.9 C) 98.9 F (37.2 C)  TempSrc:      SpO2: 99% 96% 99% 97%  Weight:      Height:        Intake/Output Summary (Last 24 hours) at 12/03/2024 1435 Last data filed at 12/03/2024 1359 Gross per 24 hour  Intake 60 ml  Output 600 ml  Net -540 ml    PULM  CTAB CV  RRR VASC  Both feet are warm.  Capillary refill is excellent bilaterally.  Compartments are soft.  More venous congestion on R vs L.  Non-tender.  Access site at left groin is fine.    Laboratory CBC    Component Value Date/Time   WBC 20.1 (H) 12/03/2024 0536   HGB 8.0 (L) 12/03/2024 0536   HCT 27.9 (L) 12/03/2024 0536   PLT 451 (H) 12/03/2024 0536    BMET    Component Value Date/Time   NA 139 12/02/2024 0438   NA 139 10/21/2024 1046   K 4.7 12/02/2024 0438   CL 92 (L) 12/02/2024 0438   CO2 34 (H) 12/02/2024 0438   GLUCOSE 146 (H) 12/02/2024 0438   BUN 40 (H) 12/02/2024 0438   BUN 48 (H) 10/21/2024 1046   CREATININE 1.29 (H) 12/02/2024 0438   CALCIUM  8.1 (L) 12/02/2024 0438   GFRNONAA 39 (L) 12/02/2024 0438   GFRAA  10/09/2009 0350    >60        The eGFR has been calculated using the MDRD equation. This calculation has not been validated in all clinical situations. eGFR's persistently <60 mL/min signify possible Chronic Kidney Disease.    Assessment/Planning: POD #2/4 s/p R SFA PTA and then thrombectomy with stenting  She is comfort care only.  Labs show renal function is at baseline and hgb is stable.  No apparent vascular complications.  Feet are vascular intact.  Neuro status is unclear due to her level of cooperation but no acute changes are suspected.   No further Vascular interventions except possible amputation if she has ischemia or extensive tissue necrosis.   Jenny [name]  12/03/2024, 2:35 PM

## 2024-12-03 NOTE — Progress Notes (Addendum)
 "                                                                                                                                                                                                          Daily Progress Note   Patient Name: Jenny [name]       Date: 12/03/2024 DOB: 09-29-1933  Age: 89 y.o. MRN#: [id number] Attending Physician: Kandis Devaughn Sayres, MD Primary Care Physician: Ileen Rosaline NOVAK, NP Admit Date: 11/14/2024  Reason for Consultation/Follow-up: Establishing goals of care  Subjective: Notes and labs reviewed.  In to see patient.  She is currently resting in bed with mild dyspnea noted.  She appears uncomfortable.  She denies need or complaint, pain or shortness of breath.  Several times she she opens her eyes states a few words, and closes them again.  Toes on both feet warm to the touch.  Discoloration is improving to right toes.   Son who is H POA is at bedside.  H POA document can be located under ACP tab.  Son discusses being notified that patient is not really eating and drinking very well, and refused medications at a previous administration time.  Patient currently has breakfast tray at bedside and denies hunger or thirst.  We discussed the need for hydration and nutrition.  We discussed patient's status.  He discusses that patient was alert 3 days ago and has been more sleepy.  We reviewed patient's medication list in depth, and Zoloft  which was recently initiated by psych history was discontinued per his request.   We discussed her diagnoses, prognosis, GOC, EOL wishes disposition and options.  Created space and opportunity for patient  to explore thoughts and feelings regarding current medical information.   A detailed discussion was had today regarding advanced directives.  Concepts specific to code status, artifical feeding and hydration, and rehospitalization were discussed.  The difference between an aggressive medical intervention path and a comfort care  path was discussed.  Values and goals of care important to patient and family were attempted to be elicited. Son discusses that at baseline patient lives alone, and enjoys her independence.   Discussed limitations of medical interventions to prolong quality of life in some situations and discussed the concept of human mortality.  Son discusses that the patient has requested to speak with chaplains or those who could answer questions regarding the dying process.  He shares that she has spoken with several chaplains and still has questions and concerns.  Discussed that we could talk about the dying process when she  is able to do so.  He discusses previous conversations with a hospice agency, and that the conversation did not go well.  He discusses her severe shortness of breath, weeks before this admission, and her baseline COPD.  We discussed how anxiety and shortness of breath are related.  He is hopeful patient will become more alert and able to participate in goals of care conversation herself.    Length of Stay: 19  Current Medications: Scheduled Meds:   budesonide  (PULMICORT ) nebulizer solution  0.25 mg Nebulization BID   dorzolamide   1 drop Both Eyes BID   furosemide   40 mg Oral Daily   gabapentin   100 mg Oral Daily   guaiFENesin   600 mg Oral BID   hydrocerin   Topical BID   insulin  aspart  0-9 Units Subcutaneous TID WC   ipratropium-albuterol   3 mL Nebulization BID   latanoprost   1 drop Both Eyes QHS   nystatin    Topical BID   pantoprazole   40 mg Oral Daily   polyethylene glycol  17 g Oral Daily   predniSONE   10 mg Oral Q breakfast   rosuvastatin   20 mg Oral Daily   senna-docusate  2 tablet Oral QHS    Continuous Infusions:  heparin  850 Units/hr (12/03/24 0644)    PRN Meds: acetaminophen  **OR** acetaminophen , albuterol , HYDROcodone -acetaminophen , hydrOXYzine , ondansetron  **OR** ondansetron  (ZOFRAN ) IV, mouth rinse, sodium chloride , sodium phosphate   Physical  Exam Constitutional:      Comments: Opens eyes temporarily to say a few words and closes them again  Pulmonary:     Effort: Pulmonary effort is normal.  Skin:    General: Skin is warm and dry.             Vital Signs: BP (!) 146/67 (BP Location: Left Arm)   Pulse 80   Temp 98.9 F (37.2 C)   Resp 14   Ht 5' 2 (1.575 m)   Wt 85.5 kg   SpO2 97%   BMI 34.46 kg/m  SpO2: SpO2: 97 % O2 Device: O2 Device: Nasal Cannula O2 Flow Rate: O2 Flow Rate (L/min): 2 L/min  Intake/output summary:  Intake/Output Summary (Last 24 hours) at 12/03/2024 1158 Last data filed at 12/03/2024 9461 Gross per 24 hour  Intake 60 ml  Output 600 ml  Net -540 ml   LBM: Last BM Date : 11/30/24 Baseline Weight: Weight: 100.8 kg Most recent weight: Weight: 85.5 kg   Patient Active Problem List   Diagnosis Date Noted   PAD (peripheral artery disease) 11/15/2024   Acute pulmonary embolism (HCC) 11/14/2024   Permanent atrial fibrillation (HCC) 11/14/2024   Obesity, Class III, BMI 40-49.9 (morbid obesity) (HCC) 11/14/2024   Sepsis (HCC) 11/14/2024   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) 10/28/2024   Respiratory distress 09/04/2024   CKD (chronic kidney disease) stage 4, GFR 15-29 ml/min (HCC) 09/03/2024   GERD (gastroesophageal reflux disease) 09/03/2024   History of atrial fibrillation 09/03/2024   Acute respiratory failure (HCC) 09/02/2024   Hyponatremia 09/02/2024   Hypokalemia 09/02/2024   AKI (acute kidney injury) 07/02/2024   Pulmonary hypertension, unspecified (HCC) 07/02/2024   Shortness of breath 07/02/2024   Acute dyspnea 07/01/2024   Right hip pain 06/21/2024   SOB (shortness of breath) 06/17/2024   COPD with acute exacerbation (HCC) 06/16/2024   Other secondary pulmonary hypertension (HCC) 10/14/2023   Junctional rhythm 09/27/2023   Acute on chronic heart failure with preserved ejection fraction (HFpEF) (HCC) 09/26/2023   Symptomatic bradycardia 09/26/2023  Pain due to onychomycosis of toenails of both feet 05/27/2023   Type II diabetes mellitus with peripheral circulatory disorder (HCC) 05/27/2023   Tachycardia-bradycardia syndrome (HCC) 03/28/2022   Stage 3b chronic kidney disease (HCC) 02/21/2022   Type 2 diabetes mellitus with diabetic chronic kidney disease (HCC) 02/21/2022   Normocytic anemia 02/21/2022   Chronic obstructive pulmonary disease, unspecified (HCC) 02/21/2022   Edema, unspecified 02/21/2022   Hypertension 02/21/2022   Congestive heart failure (HCC) 02/21/2022   Other and unspecified hyperlipidemia 02/21/2022   Old myocardial infarction 02/21/2022   PAF (paroxysmal atrial fibrillation) (HCC) 02/21/2022   Heart failure (HCC) 11/04/2021   Acute on chronic diastolic CHF (congestive heart failure) (HCC) 11/03/2021   Secondary hypercoagulable state 10/23/2021   Persistent atrial fibrillation (HCC) 10/11/2021   Bilateral lower leg cellulitis 08/22/2021   Obese    Dyspepsia 04/05/2011   Allergic rhinitis 10/16/2010   COPD (chronic obstructive pulmonary disease) (HCC) 05/21/2010   Essential hypertension 04/06/2010   EDEMA 03/09/2010   ANEMIA-NOS 10/11/2009   Unspecified glaucoma 10/11/2009   CAD (coronary artery disease) 10/03/2009   HLD (hyperlipidemia) 07/12/2009   TOBACCO ABUSE 07/12/2009   Acute myocardial infarction (HCC) 07/12/2009   Diabetes mellitus 07/12/2009    Palliative Care Assessment & Plan    Recommendations/Plan: PMT will follow. Attending team working to identify reversible causes of patient is lethargic.   Code Status:    Code Status Orders  (From admission, onward)           Start     Ordered   11/14/24 0446  Do not attempt resuscitation (DNR)- Limited -Do Not Intubate (DNI)  Continuous       Question Answer Comment  If pulseless and not breathing No CPR or chest compressions.   In Pre-Arrest Conditions (Patient Is Breathing and Has A Pulse) Do not intubate. Provide all appropriate  non-invasive medical interventions. Avoid ICU transfer unless indicated or required.   Consent: Discussion documented in EHR or advanced directives reviewed      11/14/24 0448           Code Status History     Date Active Date Inactive Code Status Order ID Comments User Context   10/28/2024 1719 11/02/2024 2214 Limited: Do not attempt resuscitation (DNR) -DNR-LIMITED -Do Not Intubate/DNI  [id number]  Royal Sill, MD ED   10/06/2024 1701 10/09/2024 1941 Full Code [id number]  Elgergawy, Brayton RAMAN, MD ED   09/02/2024 2126 09/04/2024 2217 Limited: Do not attempt resuscitation (DNR) -DNR-LIMITED -Do Not Intubate/DNI  [id number]  Alfornia Madison, MD ED   07/01/2024 1602 07/05/2024 1735 Full Code [id number]  Tobie Mario GAILS, MD ED   06/16/2024 1815 06/24/2024 2008 Limited: Do not attempt resuscitation (DNR) -DNR-LIMITED -Do Not Intubate/DNI  [id number]  Sherlon Brayton RAMAN, MD Inpatient   06/16/2024 1641 06/16/2024 1815 Full Code [id number]  Elgergawy, Brayton RAMAN, MD ED   09/26/2023 1650 09/30/2023 1737 Full Code [id number]  Caleen Qualia, MD ED   11/03/2021 1718 11/06/2021 1737 Full Code [id number]  Lanetta Lingo, MD ED   08/22/2021 1512 08/27/2021 2243 Full Code [id number]  Shona Terry SAILOR, DO ED   08/22/2021 1429 08/22/2021 1511 Full Code [id number]  Shona Terry SAILOR, DO ED       Prognosis:  < 6 months   [name], NP  Please contact Palliative Medicine Team phone at [phone] for questions and concerns.       "

## 2024-12-04 DIAGNOSIS — L03116 Cellulitis of left lower limb: Secondary | ICD-10-CM | POA: Diagnosis not present

## 2024-12-04 DIAGNOSIS — L03115 Cellulitis of right lower limb: Secondary | ICD-10-CM | POA: Diagnosis not present

## 2024-12-04 NOTE — Progress Notes (Signed)
 " Progress Note   Patient: Jenny [name] FMW:[id number] DOB: 1933/03/05 DOA: 11/14/2024     20 DOS: the patient was seen and examined on 12/04/2024   Brief hospital course:  Jenny [name] is a 89 y.o. female with medical history significant for HFpEF, hypertension, persistent A-fib on Eliquis , tachybradycardia syndrome, CAD, COPD, CKD stage 3b, type 2 diabetes, anemia. Presents to ED from [facility] (rehab), 3 days LE edema and R leg pain 1 day, also SOB   12/07: to ED early hours AM. CTA chest showed tiny segmental PE left lower lobe without heart strain, extensive multivessel CAD and mild asymmetric pulmonary edema around the left hilum. Initially was treated with DuoNebs and Solu-Medrol . She initially received LR boluses but subsequently given a dose of Lasix  as test results came in. Started on vancomycin  and Rocephin  and also on a heparin  infusion for PE. Admitted with question sepsis d/t lower extremity cellulitis, vs non-infectious SIRS multifactorial including acute dyspnea secondary to COPD and CHF exacerbation. Podiatry consulted re: venous stasis ulcers on feet - bedisde debridement, appear superficial and recs for daily dressing changes, ACE wraps to knee, po abx on dc, WBAT, f/u outpatient 1 week after discharge   12/08: Vascular surgery consult d/t decreased ABI / PE> No thrombectomy given small PE. RLE angio planned for 12/10 12/09: stable 12/10: LLE angio and stents today. Cr 1.99 12/11: renal fxn improving Cr 1.56. Vascular surgery plans on taking the patient to the vascular lab on Monday, 11/22/2024 for RLE angiogram - pt to remain on heparin  infusion until after procedure  12/12: doing well / stable.  12/13: VERY short of breath w/ minimal exertion but improves quickly w/ rest. Changed nebs, will give 1 dose IV lasix  today  12/14: good UOP w/ IV lasix , increased po dose today. Still quite SOB, wheezing. (+)influenza A --> tamiflu , postponing angio 12/15-12/16: continue tx  COPD exacerbation d/t influenza. Tentative reschedule angio for 12/18 12/17 -still short of breath but improved.  Remains on 2 L of oxygen via nasal cannula.  Unable to lay flat 12/18 -still short of breath but improved.  Right lower extremity angiogram deferred since patient is unable to lay flat.  Planned for 12/22 12/19 -complains of worsening shortness of breath.  Noted to have wheezing on exam. 12/20 - Feels better 12/21 -noted to be short of breath at rest.  Had conversational dyspnea 12/22 -appears comfortable and in no distress.  Scheduled for lower extremity angiogram today 12/23 -complains of severe pain in her right lower extremity.  Son at the bedside requesting for psych evaluation due to severe anxiety and agitation.       Assessment and Plan:  End of life care Poor prognosis. Ongoing discussions with palliative involved. Patient is alert and oriented today. She reflects that her quality of life is very poor. She is aware that her prognosis is also poor. She elects to pursue full comfort care. Son says this is in line with what she expressed to him the morning of 12/26. Comfort care orders placed. Hospice consulted. Plan is inpatient hospice.  LE edema / erythema  Multifactorial, include: Venous stasis dermatitis / ulceration  Cellulitis  Peripheral arterial disease LE edema d/t CHF / HFpEF exacerbation  Treated underlying cause(s) as noted    Dyspnea, multifactorial COPD likely exacerbated by influenza A infection.  Has completed a 5-day course of Tamiflu  CHF also playing a role Weaned to 2 liters   COPD  Chronic hypoxic respiratory failure on  2L [place] O2 at baseline  Exacerbated now d/t influenza A Arrives w/ prednisone  20 on med list, says has been maintained on that for a couple of months Continue prednisone  for comfort   Venous stasis  Venous stasis ulcers lower extremities bilaterally Question Cellulitis Completed course of Keflex  Podiatry debrided ulcers on  dorsum of feet Wound care  Peripheral arterial disease  Had angiogram with right iliac and popliteal stent on 12/10. On 12/22 had left common femoral artery angioplasty. Then on 12/24 had emergent right angiobram with thrombectomy and stent placement for acute right leg ischemia.  Per vascular is now optimized, next step would be amputation D/c heparin  as focus is now comfort   Acute PE Very small.  Anticoagulation now discontinued   HFpEF with acute exacerbation - Last known LVEF of 60 to 65% with no regional wall motion abnormality and moderate asymmetric LVH of the basal septal segment, 2D echocardiogram which was done 07/25 Now treating symptoms    AKI on ckd 3b - resolved    History tachy-brady A-fib Rate controlled    Neuropathy home gabapentin , continuing for comfort   Class 2 / borderline Class 3 obesity based on BMI:  noted            Subjective: Lying in bed. Dyspneic but not bothered by it. No pain.   Physical Exam: Vitals:   12/03/24 0745 12/03/24 2055 12/04/24 0653 12/04/24 0656  BP: (!) 146/67 (!) 149/74  93/78  Pulse: 80 79  80  Resp: 14 18  (!) 22  Temp: 98.9 F (37.2 C) 98.5 F (36.9 C)  98.2 F (36.8 C)  TempSrc:      SpO2: 97% 97%  96%  Weight:   82.4 kg   Height:   5' 2 (1.575 m)    General: sitting up in bed Cardiac:  RRR, Normal S1, S2. No murmurs. Lungs: scattered rhonchi Abdomen:  obese, soft, NT Neurologic: moving all 4, oriented to person, place, and time Extremities:  RLE purple but warm, left warm. Dressings on LEs   Data Reviewed:  There are no new results to review at this time.  Family Communication: son 12/27 telephonically  Disposition: Status is: Inpatient Remains inpatient appropriate because: pending disposition  Planned Discharge Destination: tbd   Author: Devaughn KATHEE Ban, MD 12/04/2024 4:49 PM  For on call review [email or web address].  "

## 2024-12-04 NOTE — Progress Notes (Signed)
 Jenny [name] [facility]) HOSPITAL LIAISON NOTE  Received request from [name], Case Manager (CM), for evaluation for Hospice InPatient Unit [facility]).  Spoke with [name], son to initiate education related to hospice philosophy, services, team approach to care and criteria for IPU.  Patient/family verbalized understanding of information given. [name] is unsure of plans moving forward after discussion with myself regarding IPU and also joint conversation with [name] NP/PMT.  [name] will readdress goals of care later today with [name].  Hospital liaison team will continue to follow through final discharge disposition.  Above information shared with [name], LCSW, CM and hospital medical care team.  Please call with any hospice related questions or concerns.  Thank you for the opportunity to participate in this patients care.   [name], MA, BSN, RN, FNE Nurse Liaison [phone]

## 2024-12-04 NOTE — Progress Notes (Addendum)
 "                                                                                                                                                                                                          Daily Progress Note   Patient Name: Jenny [name]       Date: 12/04/2024 DOB: 1933-02-27  Age: 89 y.o. MRN#: [id number] Attending Physician: Kandis Devaughn Sayres, MD Primary Care Physician: Ileen Rosaline NOVAK, NP Admit Date: 11/14/2024  Reason for Consultation/Follow-up: Establishing goals of care  Subjective: Notes reviewed.  Patient and son have elected to move forward with hospice care and comfort measures.  Case discussed with hospice liaison.   In to bedside to see patient.  Hospice liaison present at bedside speaking with patient and daughter-in-law.  Patient appears to be uncomfortable with work of breathing noted but denies complaints; she denies pain or shortness of breath.  Both patient and daughter-in-law state at baseline she has work of breathing noted.  Hospice liaison to continue assessment.  PMT stepped out.  ADDENDUM: Called by hospice liaison to join conversation with patient's son via speaker phone.  Son discusses that his mother would never want to use morphine .  We discussed that there are other options for symptom management besides morphine .  He discusses that patient would want to try to live until January 1.  With further conversation, he ultimately states he needs to speak with his mother regarding her wishes to transition to comfort care at this time.   ADDENDUM: Returned to bedside to speak with patient and son.  Patient is alert.  Questions answered regarding end-of-life care and regarding symptom management.  Patient and son are okay with what ever symptom management is needed including morphine .   Remained at bedside after son left. Patient discusses being a woman of faith and thinking about what is after this world.  Patient discusses her life choices and also her  children. Patient is hopeful to speak with her other 2 children whom she does not really talk to before she dies. She states she will speak with son about arranging this.   Length of Stay: 20  Current Medications: Scheduled Meds:   gabapentin   100 mg Oral Daily   predniSONE   10 mg Oral Q breakfast    Continuous Infusions:   PRN Meds: acetaminophen  **OR** acetaminophen , albuterol , artificial tears, diphenhydrAMINE , glycopyrrolate  **OR** glycopyrrolate  **OR** glycopyrrolate , haloperidol  lactate, midazolam  PF, morphine  injection, ondansetron  **OR** ondansetron  (ZOFRAN ) IV, mouth rinse, sodium phosphate   Physical Exam Pulmonary:     Comments: Some work  of breathing noted. Skin:    General: Skin is warm and dry.  Neurological:     Mental Status: She is alert.             Vital Signs: BP 93/78 (BP Location: Left Arm)   Pulse 80   Temp 98.2 F (36.8 C)   Resp (!) 22   Ht 5' 2 (1.575 m)   Wt 82.4 kg   SpO2 96%   BMI 33.23 kg/m  SpO2: SpO2: 96 % O2 Device: O2 Device: Room Air O2 Flow Rate: O2 Flow Rate (L/min): 2 L/min  Intake/output summary:  Intake/Output Summary (Last 24 hours) at 12/04/2024 1152 Last data filed at 12/03/2024 2055 Gross per 24 hour  Intake 0 ml  Output 300 ml  Net -300 ml   LBM: Last BM Date : 11/30/24 Baseline Weight: Weight: 100.8 kg Most recent weight: Weight: 82.4 kg   Patient Active Problem List   Diagnosis Date Noted   PAD (peripheral artery disease) 11/15/2024   Acute pulmonary embolism (HCC) 11/14/2024   Permanent atrial fibrillation (HCC) 11/14/2024   Obesity, Class III, BMI 40-49.9 (morbid obesity) (HCC) 11/14/2024   Sepsis (HCC) 11/14/2024   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) 10/28/2024   Respiratory distress 09/04/2024   CKD (chronic kidney disease) stage 4, GFR 15-29 ml/min (HCC) 09/03/2024   GERD (gastroesophageal reflux disease) 09/03/2024   History of atrial fibrillation 09/03/2024   Acute  respiratory failure (HCC) 09/02/2024   Hyponatremia 09/02/2024   Hypokalemia 09/02/2024   AKI (acute kidney injury) 07/02/2024   Pulmonary hypertension, unspecified (HCC) 07/02/2024   Shortness of breath 07/02/2024   Acute dyspnea 07/01/2024   Right hip pain 06/21/2024   SOB (shortness of breath) 06/17/2024   COPD with acute exacerbation (HCC) 06/16/2024   Other secondary pulmonary hypertension (HCC) 10/14/2023   Junctional rhythm 09/27/2023   Acute on chronic heart failure with preserved ejection fraction (HFpEF) (HCC) 09/26/2023   Symptomatic bradycardia 09/26/2023   Pain due to onychomycosis of toenails of both feet 05/27/2023   Type II diabetes mellitus with peripheral circulatory disorder (HCC) 05/27/2023   Tachycardia-bradycardia syndrome (HCC) 03/28/2022   Stage 3b chronic kidney disease (HCC) 02/21/2022   Type 2 diabetes mellitus with diabetic chronic kidney disease (HCC) 02/21/2022   Normocytic anemia 02/21/2022   Chronic obstructive pulmonary disease, unspecified (HCC) 02/21/2022   Edema, unspecified 02/21/2022   Hypertension 02/21/2022   Congestive heart failure (HCC) 02/21/2022   Other and unspecified hyperlipidemia 02/21/2022   Old myocardial infarction 02/21/2022   PAF (paroxysmal atrial fibrillation) (HCC) 02/21/2022   Heart failure (HCC) 11/04/2021   Acute on chronic diastolic CHF (congestive heart failure) (HCC) 11/03/2021   Secondary hypercoagulable state 10/23/2021   Persistent atrial fibrillation (HCC) 10/11/2021   Bilateral lower leg cellulitis 08/22/2021   Obese    Dyspepsia 04/05/2011   Allergic rhinitis 10/16/2010   COPD (chronic obstructive pulmonary disease) (HCC) 05/21/2010   Essential hypertension 04/06/2010   EDEMA 03/09/2010   ANEMIA-NOS 10/11/2009   Unspecified glaucoma 10/11/2009   CAD (coronary artery disease) 10/03/2009   HLD (hyperlipidemia) 07/12/2009   TOBACCO ABUSE 07/12/2009   Acute myocardial infarction [facility]) 07/12/2009   Diabetes  mellitus 07/12/2009    Palliative Care Assessment & Plan   Recommendations/Plan: Patient and family working with hospice liaison to arrange hospice care following discharge.   Code Status:    Code Status Orders  (From admission, onward)           Start  Ordered   12/03/24 1249  Do not attempt resuscitation (DNR) - Comfort care  Continuous       Question Answer Comment  If patient has no pulse and is not breathing Do Not Attempt Resuscitation   In Pre-Arrest Conditions (Patient Is Breathing and Has a Pulse) Provide comfort measures. Relieve any mechanical airway obstruction. Avoid transfer unless required for comfort.   Consent: Discussion documented in EHR or advanced directives reviewed      12/03/24 1254           Code Status History     Date Active Date Inactive Code Status Order ID Comments User Context   11/14/2024 [id number] 12/03/2024 1254 Limited: Do not attempt resuscitation (DNR) -DNR-LIMITED -Do Not Intubate/DNI  [id number]  Cleatus Delayne GAILS, MD ED   10/28/2024 1719 11/02/2024 2214 Limited: Do not attempt resuscitation (DNR) -DNR-LIMITED -Do Not Intubate/DNI  [id number]  Royal Sill, MD ED   10/06/2024 1701 10/09/2024 1941 Full Code [id number]  Elgergawy, Brayton RAMAN, MD ED   09/02/2024 2126 09/04/2024 2217 Limited: Do not attempt resuscitation (DNR) -DNR-LIMITED -Do Not Intubate/DNI  [id number]  Alfornia Madison, MD ED   07/01/2024 1602 07/05/2024 1735 Full Code [id number]  Tobie Mario GAILS, MD ED   06/16/2024 1815 06/24/2024 2008 Limited: Do not attempt resuscitation (DNR) -DNR-LIMITED -Do Not Intubate/DNI  [id number]  Sherlon Brayton RAMAN, MD Inpatient   06/16/2024 1641 06/16/2024 1815 Full Code [id number]  Elgergawy, Brayton RAMAN, MD ED   09/26/2023 1650 09/30/2023 1737 Full Code [id number]  Caleen Qualia, MD ED   11/03/2021 1718 11/06/2021 1737 Full Code [id number]  Lanetta Lingo, MD ED   08/22/2021 1512 08/27/2021 2243 Full Code [id number]  Shona Terry SAILOR, DO ED   08/22/2021 1429  08/22/2021 1511 Full Code [id number]  Shona Terry SAILOR, DO ED       [name], NP  Please contact Palliative Medicine Team phone at [phone] for questions and concerns.       "

## 2024-12-04 NOTE — Discharge Summary (Signed)
 Jenny [name] FMW:[id number] DOB: 09/10/1933 DOA: 11/14/2024  PCP: Ileen Rosaline NOVAK, NP  Admit date: 11/14/2024 Discharge date: 12/04/2024  Time spent: 35 minutes     Discharge Diagnoses:  Principal Problem:   Bilateral lower leg cellulitis Active Problems:   COPD with acute exacerbation (HCC)   CAD (coronary artery disease)   Acute on chronic heart failure with preserved ejection fraction (HFpEF) (HCC)   Acute pulmonary embolism (HCC)   Essential hypertension   Tachycardia-bradycardia syndrome (HCC)   Permanent atrial fibrillation (HCC)   Stage 3b chronic kidney disease (HCC)   CKD (chronic kidney disease) stage 4, GFR 15-29 ml/min (HCC)   Type 2 diabetes mellitus with diabetic chronic kidney disease (HCC)   Type II diabetes mellitus with peripheral circulatory disorder (HCC)   Obesity, Class III, BMI 40-49.9 (morbid obesity) (HCC)   Sepsis (HCC)   PAD (peripheral artery disease)   Discharge Condition: stable  Diet recommendation: ad lib  Filed Weights   12/01/24 1200 12/02/24 0500 12/04/24 0653  Weight: 85.9 kg 85.5 kg 82.4 kg    History of present illness:   Jenny [name] is a 89 y.o. female with medical history significant for HFpEF, hypertension, persistent A-fib on Eliquis , tachybradycardia syndrome, CAD, COPD, CKD stage 3b, type 2 diabetes, anemia,, being admitted with several acute conditions including lower extremity cellulitis with SIRS, and acute dyspnea secondary to COPD and CHF exacerbation with a segmental PE seen on CTA chest.  She presented to the ED with a 3-day history of lower extremity edema and with right leg pain that started on the night of arrival. In the ED she was tachypneic requiring 2 L to maintain sats in the high 90s labs notable for troponin 108, proBNP 2277 WBC 10.7 with lactic acid 1.3 and negative respiratory viral panel Hemoglobin at baseline at 9.8 Creatinine at baseline at 1.39  EKG showed A-fib at 70 with nonspecific T wave  changes CTA chest showed tiny segmental PE left lower lobe without heart strain, extensive multivessel CAD and mild asymmetric pulmonary edema around the left hilum Bilateral lower extremity venous Dopplers ordered from the ED, result pending   Patient was treated with DuoNebs and Solu-Medrol .  She initially received LR boluses but subsequently given a dose of Lasix  as test results came in.  Started on vancomycin  and Rocephin  and also on a heparin  infusion for PE Admission requested   Hospital Course:   Jenny [name] is a 89 y.o. female with medical history significant for HFpEF, hypertension, persistent A-fib on Eliquis , tachybradycardia syndrome, CAD, COPD, CKD stage 3b, type 2 diabetes, anemia. Presents to ED from [facility] (rehab), 3 days LE edema and R leg pain 1 day, also SOB   12/07: to ED early hours AM. CTA chest showed tiny segmental PE left lower lobe without heart strain, extensive multivessel CAD and mild asymmetric pulmonary edema around the left hilum. Initially was treated with DuoNebs and Solu-Medrol . She initially received LR boluses but subsequently given a dose of Lasix  as test results came in. Started on vancomycin  and Rocephin  and also on a heparin  infusion for PE. Admitted with question sepsis d/t lower extremity cellulitis, vs non-infectious SIRS multifactorial including acute dyspnea secondary to COPD and CHF exacerbation. Podiatry consulted re: venous stasis ulcers on feet - bedisde debridement, appear superficial and recs for daily dressing changes, ACE wraps to knee, po abx on dc, WBAT, f/u outpatient 1 week after discharge   12/08: Vascular surgery consult d/t decreased ABI /  PE> No thrombectomy given small PE. RLE angio planned for 12/10 12/09: stable 12/10: LLE angio and stents today. Cr 1.99 12/11: renal fxn improving Cr 1.56. Vascular surgery plans on taking the patient to the vascular lab on Monday, 11/22/2024 for RLE angiogram - pt to remain on heparin   infusion until after procedure  12/12: doing well / stable.  12/13: VERY short of breath w/ minimal exertion but improves quickly w/ rest. Changed nebs, will give 1 dose IV lasix  today  12/14: good UOP w/ IV lasix , increased po dose today. Still quite SOB, wheezing. (+)influenza A --> tamiflu , postponing angio 12/15-12/16: continue tx COPD exacerbation d/t influenza. Tentative reschedule angio for 12/18 12/17 -still short of breath but improved.  Remains on 2 L of oxygen via nasal cannula.  Unable to lay flat 12/18 -still short of breath but improved.  Right lower extremity angiogram deferred since patient is unable to lay flat.  Planned for 12/22 12/19 -complains of worsening shortness of breath.  Noted to have wheezing on exam. 12/20 - Feels better 12/21 -noted to be short of breath at rest.  Had conversational dyspnea 12/22 -appears comfortable and in no distress.  Scheduled for lower extremity angiogram today 12/23 -complains of severe pain in her right lower extremity.  Son at the bedside requesting for psych evaluation due to severe anxiety and agitation. End of life care Poor prognosis. Ongoing discussions with palliative involved. Patient is alert and oriented today. She reflects that her quality of life is very poor. She is aware that her prognosis is also poor. She elects to pursue full comfort care. Son says this is in line with what she expressed to him the morning of 12/26. Comfort care orders placed. Hospice consulted. Plan is inpatient hospice.   LE edema / erythema  Multifactorial, include: Venous stasis dermatitis / ulceration  Cellulitis  Peripheral arterial disease LE edema d/t CHF / HFpEF exacerbation  Treated underlying cause(s) as noted    Dyspnea, multifactorial COPD likely exacerbated by influenza A infection.  Has completed a 5-day course of Tamiflu  CHF also playing a role Weaned to 2 liters   COPD  Chronic hypoxic respiratory failure on 2L [place] O2 at baseline   Exacerbated now d/t influenza A Arrives w/ prednisone  20 on med list, says has been maintained on that for a couple of months   Venous stasis  Venous stasis ulcers lower extremities bilaterally Question Cellulitis Completed course of Keflex  Podiatry debrided ulcers on dorsum of feet Wound care   Peripheral arterial disease  Had angiogram with right iliac and popliteal stent on 12/10. On 12/22 had left common femoral artery angioplasty. Then on 12/24 had emergent right angiobram with thrombectomy and stent placement for acute right leg ischemia.  Per vascular is now optimized, next step would be amputation D/c heparin  as focus is now comfort   Acute PE Very small.  Anticoagulation now discontinued   HFpEF with acute exacerbation - Last known LVEF of 60 to 65% with no regional wall motion abnormality and moderate asymmetric LVH of the basal septal segment, 2D echocardiogram which was done 07/25 Now treating symptoms    AKI on ckd 3b - resolved    History tachy-brady A-fib Rate controlled    Neuropathy home gabapentin , continuing for comfort  Procedures: See above   Consultations: Vascular  Discharge Exam: Vitals:   12/03/24 2055 12/04/24 0656  BP: (!) 149/74 93/78  Pulse: 79 80  Resp: 18 (!) 22  Temp: 98.5 F (36.9 C)  98.2 F (36.8 C)  SpO2: 97% 96%    General: sitting up in bed Cardiac:  RRR, Normal S1, S2. No murmurs. Lungs: scattered rhonchi Abdomen:  obese, soft, NT Neurologic: moving all 4, oriented to person, place, and time Extremities:  RLE purple but warm, left warm. Dressings on LEs  Discharge Instructions   Discharge Instructions     Discharge wound care:   Complete by: As directed    Change dressing daily   Increase activity slowly   Complete by: As directed       Allergies as of 12/04/2024       Reactions   Aspirin  Other (See Comments)   Can take 81 mg not 325 mg -bleeding   Entresto  [sacubitril -valsartan ] Shortness Of Breath    COPD and reports increased SOB with Entresto    Atorvastatin  Itching, Rash   Itching and myaliga   Cyclobenzaprine     muscle relaxers-ulcer hemorrhage (hospitalized), ulcer hemorrhage   Lactose Intolerance (gi) Other (See Comments)   GI upset   Meperidine Hcl Other (See Comments)   Not known   Pravastatin  Rash   Propoxyphene    Hallucination, hallucinations, vomiting   Wound Dressing Adhesive Other (See Comments)   red, stings        Medication List     STOP taking these medications    acetaminophen  500 MG tablet Commonly known as: TYLENOL    apixaban  2.5 MG Tabs tablet Commonly known as: ELIQUIS    dorzolamide  2 % ophthalmic solution Commonly known as: TRUSOPT    furosemide  40 MG tablet Commonly known as: LASIX    hydrOXYzine  25 MG tablet Commonly known as: ATARAX    ipratropium-albuterol  0.5-2.5 (3) MG/3ML Soln Commonly known as: DUONEB   latanoprost  0.005 % ophthalmic solution Commonly known as: XALATAN    Nitrostat  0.4 MG SL tablet Generic drug: nitroGLYCERIN    nystatin  powder Commonly known as: MYCOSTATIN /NYSTOP    pantoprazole  40 MG tablet Commonly known as: Protonix    potassium chloride  SA 20 MEQ tablet Commonly known as: KLOR-CON  M   predniSONE  20 MG tablet Commonly known as: DELTASONE    repaglinide  0.5 MG tablet Commonly known as: PRANDIN    rosuvastatin  20 MG tablet Commonly known as: CRESTOR    Stiolto Respimat 2.5-2.5 MCG/ACT Aers Generic drug: Tiotropium Bromide-Olodaterol   Ventolin  HFA 108 (90 Base) MCG/ACT inhaler Generic drug: albuterol        TAKE these medications    gabapentin  100 MG capsule Commonly known as: NEURONTIN  Take 200 mg by mouth at bedtime. What changed: Another medication with the same name was removed. Continue taking this medication, and follow the directions you see here.               Discharge Care Instructions  (From admission, onward)           Start     Ordered   12/04/24 0000  Discharge  wound care:       Comments: Change dressing daily   12/04/24 1800           Allergies[1]  Contact information for follow-up providers     [facility]. Go on 12/21/2024.   Specialty: Cardiology Why: Previously scheduled Advanced Heart Failure Clinic appointment on 12/21/2024 with Ellouise Class, FNP [facility], [address], Second Floor Free Valet Parking at the door Contact information: [address] [place]  [address] [phone]             Contact information for after-discharge care  Destination     [facility] .   Service: Skilled Nursing Contact information: [address] [place]  [phone]                      The results of significant diagnostics from this hospitalization (including imaging, microbiology, ancillary and laboratory) are listed below for reference.    Significant Diagnostic Studies: PERIPHERAL VASCULAR CATHETERIZATION Result Date: 12/01/2024 See surgical note for result.  PERIPHERAL VASCULAR CATHETERIZATION Result Date: 11/29/2024 See surgical note for result.  DG Chest Port 1 View Result Date: 11/28/2024 EXAM: 1 VIEW(S) XRAY OF THE CHEST 11/28/2024 09:40:00 AM COMPARISON: 11/20/2024 CLINICAL HISTORY: SOB (shortness of breath) FINDINGS: LUNGS AND PLEURA: Interstitial coarsening with possible mild edema . Right upper lobe pulmonary nodule measuring approximately 1.7 cm. . Retrocardiac opacity. Small left pleural effusion. No pneumothorax. HEART AND MEDIASTINUM: Stable cardiomegaly. Aortic atherosclerotic calcification. BONES AND SOFT TISSUES: No acute osseous abnormality. IMPRESSION: 1. Coarsened interstitial markings with possible mild edema. 2. New small left pleural effusion. 3. New left lung base retrocardiac opacity, which may reflect atelectasis or airspace disease. 4. Stable cardiomegaly. 5. RIGHT upper lobe  pulmonary nodule, better assessed on recent CT. Recommend management as outlined on December 7th CT. Electronically signed by: Waddell Calk MD 11/28/2024 09:52 AM EST RP Workstation: [id number]   DG Chest Port 1 View Result Date: 11/20/2024 CLINICAL DATA:  [id number] SOB (shortness of breath) [id number] EXAM: PORTABLE CHEST 1 VIEW COMPARISON:  November 14, 2024, July 01, 2024 FINDINGS: The cardiomediastinal silhouette is unchanged in contour.Atherosclerotic calcifications. RIGHT upper lobe pulmonary nodule, better assessed on recent CT. No pleural effusion. No pneumothorax. No acute pleuroparenchymal abnormality. IMPRESSION: 1. No acute cardiopulmonary abnormality. 2. RIGHT upper lobe pulmonary nodule, better assessed on recent CT. Recommend management as outlined on December 7th CT. Electronically Signed   By: Corean Salter M.D.   On: 11/20/2024 11:25   PERIPHERAL VASCULAR CATHETERIZATION Result Date: 11/17/2024 See surgical note for result.  US  ARTERIAL ABI (SCREENING LOWER EXTREMITY) Result Date: 11/14/2024 CLINICAL DATA:  Tobacco abuse, hypertension, claudication, diabetes, hyperlipidemia EXAM: NONINVASIVE PHYSIOLOGIC VASCULAR STUDY OF BILATERAL LOWER EXTREMITIES TECHNIQUE: Evaluation of both lower extremities were performed at rest, including calculation of ankle-brachial indices with single level Doppler, pressure and pulse volume recording. COMPARISON:  08/23/2021 FINDINGS: Right ABI:  0.7 Left ABI:  0.8 Right Lower Extremity:  Abnormal monophasic arterial waveforms. Left Lower Extremity:  Abnormal monophasic arterial waveforms. 0.5-0.79 Moderate PAD IMPRESSION: 1. Abnormal ABI and arterial waveforms within the bilateral lower extremities, consistent with moderate underlying peripheral arterial disease. Electronically Signed   By: Ozell Daring M.D.   On: 11/14/2024 18:00   US  Venous Img Lower Bilateral Result Date: 11/14/2024 EXAM: ULTRASOUND DUPLEX OF THE BILATERAL LOWER EXTREMITY VEINS  TECHNIQUE: Duplex ultrasound using B-mode/gray scaled imaging and Doppler spectral analysis and color flow was obtained of the deep venous structures of the bilateral lower extremity. COMPARISON: US  Left Lower Extremity 11/16/2018. CLINICAL HISTORY: leg pain and swelling FINDINGS: LEFT: The common femoral vein, femoral vein, and popliteal vein of the left lower extremity demonstrate normal compressibility with normal color flow and spectral analysis. The calf veins are suboptimally visualized. RIGHT: The common femoral vein, femoral vein, and popliteal vein of the right lower extremity demonstrate normal compressibility with normal color flow and spectral analysis. The calf veins are suboptimally visualized. IMPRESSION: 1. No evidence of DVT. Evaluation of the calf veins is suboptimal. Electronically signed by: Evalene Coho MD 11/14/2024 06:03 AM  EST RP Workstation: [id number]   CT Angio Chest PE W and/or Wo Contrast Result Date: 11/14/2024 EXAM: CTA of the Chest with contrast for PE 11/14/2024 02:55:15 AM TECHNIQUE: CTA of the chest was performed after the administration of intravenous contrast. Multiplanar reformatted images are provided for review. MIP images are provided for review. Automated exposure control, iterative reconstruction, and/or weight based adjustment of the mA/kV was utilized to reduce the radiation dose to as low as reasonably achievable. COMPARISON: None available. CLINICAL HISTORY: Pulmonary embolism (PE) suspected, high prob; Short of breath, tachypneic, new oxygen requirement, chest x-ray shows pulmonary nodule. FINDINGS: PULMONARY ARTERIES: Pulmonary arteries are adequately opacified for evaluation. Central pulmonary arteries are abnormal in caliber. Acute pulmonary embolus is present, characterized by a tiny single branching intraluminal filling defect within the left lower lobar posterior segmental pulmonary artery. The embolic burden is tiny. MEDIASTINUM: Extensive multivessel  coronary artery calcifications. Mild cardiomegaly with asymmetric enlargement of the right heart chambers in keeping with elevated right heart pressure. Relative enlargement of the right ventricle with inversion of the normal RV/LV ratio, unlikely to be the result of the tiny embolic insult and more likely representing a chronic finding. Moderate atherosclerotic calcification within the thoracic aorta. No aortic aneurysm. LYMPH NODES: No mediastinal, hilar or axillary lymphadenopathy. LUNGS AND PLEURA: A 13 mm nodule is seen within the right upper lobe (48/5), indeterminate. Perihilar ground-glass pulmonary infiltrate asymmetrically involving the left hilum in keeping with mild asymmetric pulmonary edema. No focal consolidation. No pleural effusion or pneumothorax. UPPER ABDOMEN: Limited images of the upper abdomen are unremarkable. SOFT TISSUES AND BONES: Osseous structures are age-appropriate. No acute bone abnormality. No lytic or blastic bone lesion. No acute soft tissue abnormality. The critical findings of this study were discussed directly with Dr. Neomi by myself at 3:32 am. IMPRESSION: 1. Tiny acute pulmonary embolus in the left lower lobar posterior segmental pulmonary artery. No CT evidence of right heart strain attributable to this embolus, with right heart enlargement favored to be chronic. 2. Extensive multivessel coronary artery calcifications and mild cardiomegaly with asymmetric enlargement of the right heart chambers, favored chronic. 3. Mild asymmetric pulmonary edema involving the left hilum. 4. Indeterminate 13 mm right upper lobe pulmonary nodule. Recommend non-contrast chest CT at 3 months, PET/CT, or tissue sampling per Fleischner Society Guidelines. 5. The critical findings of this study were discussed directly with Dr. Neomi by myself at 3:32 am. Electronically signed by: Dorethia Molt MD 11/14/2024 03:33 AM EST RP Workstation: [id number]   DG Chest Portable 1 View Result Date:  11/14/2024 EXAM: 1 VIEW(S) XRAY OF THE CHEST 11/14/2024 01:20:16 AM COMPARISON: 10/28/2024 CLINICAL HISTORY: Shortness of breath. FINDINGS: LUNGS AND PLEURA: 12 mm right upper lobe pulmonary nodule, stable since the prior study. No acute airspace disease. No pleural effusion. No pneumothorax. HEART AND MEDIASTINUM: Cardiomegaly. Aortic atherosclerosis. BONES AND SOFT TISSUES: No acute osseous abnormality. IMPRESSION: 1. No acute cardiopulmonary disease. 2. Stable 12 mm right upper lobe pulmonary nodule. This could be further evaluated with chest CT. Electronically signed by: Franky Crease MD 11/14/2024 01:26 AM EST RP Workstation: [id number]    Microbiology: No results found for this or any previous visit (from the past 240 hours).   Labs: Basic Metabolic Panel: Recent Labs  Lab 11/29/24 0101 12/01/24 0836 12/02/24 0438  NA 141  --  139  K 3.8  --  4.7  CL 93*  --  92*  CO2 38*  --  34*  GLUCOSE 224*  --  146*  BUN 32*  --  40*  CREATININE 1.30*  --  1.29*  CALCIUM  8.3*  --  8.1*  MG 2.2 2.2  --    Liver Function Tests: No results for input(s): AST, ALT, ALKPHOS, BILITOT, PROT, ALBUMIN in the last 168 hours. No results for input(s): LIPASE, AMYLASE in the last 168 hours. No results for input(s): AMMONIA in the last 168 hours. CBC: Recent Labs  Lab 11/28/24 1543 11/29/24 0101 12/01/24 1527 12/03/24 0536  WBC 13.9* 10.6* 11.7* 20.1*  HGB 9.2* 8.5* 8.1* 8.0*  HCT 31.6* 29.0* 27.9* 27.9*  MCV 84.5 84.8 84.8 86.6  PLT 434* 432* 393 451*   Cardiac Enzymes: No results for input(s): CKTOTAL, CKMB, CKMBINDEX, TROPONINI in the last 168 hours. BNP: BNP (last 3 results) Recent Labs    10/06/24 1358 10/28/24 1416 10/29/24 0406  BNP 138.7* 84.1 265.1*    ProBNP (last 3 results) Recent Labs    11/14/24 0022  PROBNP 2,277.0*    CBG: Recent Labs  Lab 12/02/24 1137 12/02/24 1657 12/02/24 2141 12/03/24 0746 12/03/24 1206  GLUCAP 183* 141* 176*  171* 180*       Signed:  Devaughn KATHEE Ban MD.  [facility] 12/04/2024, 6:01 PM     [1]  Allergies Allergen Reactions   Aspirin  Other (See Comments)    Can take 81 mg not 325 mg -bleeding   Entresto  [Sacubitril -Valsartan ] Shortness Of Breath    COPD and reports increased SOB with Entresto    Atorvastatin  Itching and Rash    Itching and myaliga     Cyclobenzaprine      muscle relaxers-ulcer hemorrhage (hospitalized), ulcer hemorrhage   Lactose Intolerance (Gi) Other (See Comments)    GI upset   Meperidine Hcl Other (See Comments)    Not known   Pravastatin  Rash   Propoxyphene     Hallucination, hallucinations, vomiting   Wound Dressing Adhesive Other (See Comments)    red, stings

## 2024-12-04 NOTE — Progress Notes (Signed)
 Pt report called to [facility] all questions answered

## 2024-12-04 NOTE — Progress Notes (Signed)
 Pt aware of transfer to another unit 1C, agrees. When asked if I needed to call her son she said no not to bother him. Belongings sent with pt to room 122A, report had been given to Arland VEAR PEAK.

## 2024-12-04 NOTE — Progress Notes (Signed)
 [facility]) Liaison Note  Follow up post goals of care discussion with palliative care.  Patient and son both accepting of transitioning from [facility] to hospice [facility]).  Patient's son, Ozell, will complete consents electronically.  This RN has arranged [facility] pick up.  Patient has 5 patients ahead of her.  [facility] estimates pick up after Midnight.  Hospital nurse to call report to the [facility] at [phone].  Please ensure portable DNR accompanies patient to the [facility].  Please medicate patient prior to transport as needed for patient comfort.  Thank you for allowing participation in this patient's care.  [name], RN Nurse Liaison [phone]

## 2024-12-05 NOTE — Plan of Care (Signed)
 Patient with complaint of productive coughing. Given PRN medication per order. Awaiting transport to hospice facility.   Problem: Skin Integrity: Goal: Risk for impaired skin integrity will decrease Outcome: Progressing   Problem: Education: Goal: Knowledge of General Education information will improve Description: Including pain rating scale, medication(s)/side effects and non-pharmacologic comfort measures Outcome: Progressing   Problem: Clinical Measurements: Goal: Ability to maintain clinical measurements within normal limits will improve Outcome: Progressing Goal: Will remain free from infection Outcome: Progressing Goal: Diagnostic test results will improve Outcome: Progressing Goal: Respiratory complications will improve Outcome: Progressing Goal: Cardiovascular complication will be avoided Outcome: Progressing

## 2024-12-05 NOTE — Plan of Care (Signed)
"   Patient discharge to inpatient hospice left at 0221.   Problem: Education: Goal: Ability to describe self-care measures that may prevent or decrease complications (Diabetes Survival Skills Education) will improve 12/05/2024 0221 by Trudy Shona CROME, RN Outcome: Adequate for Discharge 12/05/2024 0054 by Trudy Shona CROME, RN Outcome: Progressing Goal: Individualized Educational Video(s) 12/05/2024 0221 by Trudy Shona CROME, RN Outcome: Adequate for Discharge 12/05/2024 0054 by Trudy Shona CROME, RN Outcome: Progressing   Problem: Coping: Goal: Ability to adjust to condition or change in health will improve 12/05/2024 0221 by Trudy Shona CROME, RN Outcome: Adequate for Discharge 12/05/2024 0054 by Trudy Shona CROME, RN Outcome: Progressing   Problem: Health Behavior/Discharge Planning: Goal: Ability to identify and utilize available resources and services will improve 12/05/2024 0221 by Trudy Shona CROME, RN Outcome: Adequate for Discharge 12/05/2024 0054 by Trudy Shona CROME, RN Outcome: Progressing Goal: Ability to manage health-related needs will improve 12/05/2024 0221 by Trudy Shona CROME, RN Outcome: Adequate for Discharge 12/05/2024 0054 by Trudy Shona CROME, RN Outcome: Progressing   Problem: Clinical Measurements: Goal: Ability to maintain clinical measurements within normal limits will improve 12/05/2024 0221 by Trudy Shona CROME, RN Outcome: Adequate for Discharge 12/05/2024 0054 by Trudy Shona CROME, RN Outcome: Progressing Goal: Will remain free from infection 12/05/2024 0221 by Trudy Shona CROME, RN Outcome: Adequate for Discharge 12/05/2024 0054 by Trudy Shona CROME, RN Outcome: Progressing Goal: Diagnostic test results will improve 12/05/2024 0221 by Trudy Shona CROME, RN Outcome: Adequate for Discharge 12/05/2024 0054 by Trudy Shona CROME, RN Outcome: Progressing Goal: Respiratory complications will improve 12/05/2024 0221 by  Trudy Shona CROME, RN Outcome: Adequate for Discharge 12/05/2024 0054 by Trudy Shona CROME, RN Outcome: Progressing Goal: Cardiovascular complication will be avoided 12/05/2024 0221 by Trudy Shona CROME, RN Outcome: Adequate for Discharge 12/05/2024 0054 by Trudy Shona CROME, RN Outcome: Progressing   "

## 2024-12-20 ENCOUNTER — Telehealth: Payer: Self-pay | Admitting: Family

## 2024-12-20 NOTE — Telephone Encounter (Signed)
 Called to confirm/remind patient of their appointment at [facility] on 12/21/24.   Appointment:   [] Confirmed  [] Left mess   [x] No answer/No voice mail  [] VM Full/unable to leave message  [] Phone not in service  Patient reminded to bring all medications and/or complete list.  Confirmed patient has transportation. Gave directions, instructed to utilize valet parking.
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
## 2024-12-21 ENCOUNTER — Ambulatory Visit: Admitting: Family

## 2025-01-26 ENCOUNTER — Ambulatory Visit: Admitting: Cardiovascular Disease
